# Patient Record
Sex: Male | Born: 1955 | Race: Black or African American | Hispanic: No | Smoking: Former smoker
Health system: Southern US, Community
[De-identification: ages and names within clinical notes are randomized; demographics above are authoritative.]

## PROBLEM LIST (undated history)

## (undated) DIAGNOSIS — I2510 Atherosclerotic heart disease of native coronary artery without angina pectoris: Secondary | ICD-10-CM

## (undated) DIAGNOSIS — I10 Essential (primary) hypertension: Secondary | ICD-10-CM

## (undated) DIAGNOSIS — I4891 Unspecified atrial fibrillation: Secondary | ICD-10-CM

## (undated) DIAGNOSIS — K219 Gastro-esophageal reflux disease without esophagitis: Secondary | ICD-10-CM

## (undated) DIAGNOSIS — E119 Type 2 diabetes mellitus without complications: Secondary | ICD-10-CM

## (undated) DIAGNOSIS — I739 Peripheral vascular disease, unspecified: Secondary | ICD-10-CM

## (undated) DIAGNOSIS — I82409 Acute embolism and thrombosis of unspecified deep veins of unspecified lower extremity: Secondary | ICD-10-CM

## (undated) DIAGNOSIS — E785 Hyperlipidemia, unspecified: Secondary | ICD-10-CM

## (undated) HISTORY — DX: Peripheral vascular disease, unspecified: I73.9

## (undated) HISTORY — PX: OTHER SURGICAL HISTORY: SHX169

## (undated) HISTORY — DX: Acute embolism and thrombosis of unspecified deep veins of unspecified lower extremity: I82.409

## (undated) HISTORY — PX: NECK SURGERY: SHX720

## (undated) HISTORY — DX: Hyperlipidemia, unspecified: E78.5

## (undated) HISTORY — DX: Type 2 diabetes mellitus without complications: E11.9

## (undated) HISTORY — DX: Essential (primary) hypertension: I10

---

## 2013-01-10 ENCOUNTER — Emergency Department: Payer: Self-pay | Admitting: Emergency Medicine

## 2013-01-10 LAB — COMPREHENSIVE METABOLIC PANEL
Albumin: 4 g/dL (ref 3.4–5.0)
Alkaline Phosphatase: 91 U/L (ref 50–136)
Anion Gap: 3 — ABNORMAL LOW (ref 7–16)
Bilirubin,Total: 0.3 mg/dL (ref 0.2–1.0)
Calcium, Total: 9.5 mg/dL (ref 8.5–10.1)
Creatinine: 1.05 mg/dL (ref 0.60–1.30)
EGFR (African American): >60
EGFR (Non-African Amer.): >60
Glucose: 268 mg/dL — ABNORMAL HIGH (ref 65–99)
Osmolality: 287 (ref 275–301)
Potassium: 4.3 mmol/L (ref 3.5–5.1)
SGOT(AST): 16 U/L (ref 15–37)
Sodium: 139 mmol/L (ref 136–145)

## 2013-01-10 LAB — CBC
HCT: 50.7 % (ref 40.0–52.0)
HGB: 17.6 g/dL (ref 13.0–18.0)
MCH: 30.2 pg (ref 26.0–34.0)
MCV: 87 fL (ref 80–100)
Platelet: 221 10*3/uL (ref 150–440)
RDW: 13.6 % (ref 11.5–14.5)

## 2013-01-11 LAB — URINALYSIS, COMPLETE
Bacteria: NONE SEEN
Blood: NEGATIVE
Ketone: NEGATIVE
Leukocyte Esterase: NEGATIVE
Nitrite: NEGATIVE
Ph: 5 (ref 4.5–8.0)
Specific Gravity: 1.025 (ref 1.003–1.030)
Squamous Epithelial: <1
WBC UR: 1 /HPF (ref 0–5)

## 2013-11-12 LAB — BASIC METABOLIC PANEL
Anion Gap: 6 — ABNORMAL LOW (ref 7–16)
BUN: 8 mg/dL (ref 7–18)
CALCIUM: 9.8 mg/dL (ref 8.5–10.1)
Chloride: 101 mmol/L (ref 98–107)
Co2: 30 mmol/L (ref 21–32)
Creatinine: 0.87 mg/dL (ref 0.60–1.30)
EGFR (African American): >60
EGFR (Non-African Amer.): >60
Glucose: 305 mg/dL — ABNORMAL HIGH (ref 65–99)
Osmolality: 284 (ref 275–301)
Potassium: 3.9 mmol/L (ref 3.5–5.1)
Sodium: 137 mmol/L (ref 136–145)

## 2013-11-12 LAB — URINALYSIS, COMPLETE
BACTERIA: NEGATIVE
Bilirubin,UR: NEGATIVE
Blood: NEGATIVE
Glucose,UR: >=500 mg/dL (ref 0–75)
Ketone: NEGATIVE
LEUKOCYTE ESTERASE: NEGATIVE
NITRITE: NEGATIVE
PH: 5 (ref 4.5–8.0)
PROTEIN: NEGATIVE
RBC, UR: NONE SEEN /HPF (ref 0–5)
Specific Gravity: 1.024 (ref 1.003–1.030)
Squamous Epithelial: NONE SEEN
WBC UR: NONE SEEN /HPF (ref 0–5)

## 2013-11-12 LAB — CBC
HCT: 44.1 % (ref 40.0–52.0)
HGB: 14.8 g/dL (ref 13.0–18.0)
MCH: 29.7 pg (ref 26.0–34.0)
MCHC: 33.6 g/dL (ref 32.0–36.0)
MCV: 88 fL (ref 80–100)
Platelet: 209 10*3/uL (ref 150–440)
RBC: 4.99 10*6/uL (ref 4.40–5.90)
RDW: 14 % (ref 11.5–14.5)
WBC: 11.3 10*3/uL — ABNORMAL HIGH (ref 3.8–10.6)

## 2013-11-13 ENCOUNTER — Inpatient Hospital Stay: Payer: Self-pay | Admitting: Internal Medicine

## 2013-11-13 LAB — VANCOMYCIN, TROUGH: Vancomycin, Trough: 15 ug/mL (ref 10–20)

## 2013-11-14 LAB — CBC WITH DIFFERENTIAL/PLATELET
BASOS ABS: 0.1 10*3/uL (ref 0.0–0.1)
Basophil %: 0.7 %
EOS ABS: 0.1 10*3/uL (ref 0.0–0.7)
Eosinophil %: 1 %
HCT: 38.7 % — ABNORMAL LOW (ref 40.0–52.0)
HGB: 13.2 g/dL (ref 13.0–18.0)
LYMPHS PCT: 27.5 %
Lymphocyte #: 2.5 10*3/uL (ref 1.0–3.6)
MCH: 29.8 pg (ref 26.0–34.0)
MCHC: 34.1 g/dL (ref 32.0–36.0)
MCV: 87 fL (ref 80–100)
Monocyte #: 0.8 x10 3/mm (ref 0.2–1.0)
Monocyte %: 8.8 %
NEUTROS ABS: 5.6 10*3/uL (ref 1.4–6.5)
NEUTROS PCT: 62 %
Platelet: 184 10*3/uL (ref 150–440)
RBC: 4.44 10*6/uL (ref 4.40–5.90)
RDW: 13.5 % (ref 11.5–14.5)
WBC: 9 10*3/uL (ref 3.8–10.6)

## 2013-11-14 LAB — HEMOGLOBIN A1C: Hemoglobin A1C: 8.1 % — ABNORMAL HIGH (ref 4.2–6.3)

## 2013-11-14 LAB — LIPID PANEL
CHOLESTEROL: 166 mg/dL (ref 0–200)
HDL Cholesterol: 23 mg/dL — ABNORMAL LOW (ref 40–60)
LDL CHOLESTEROL, CALC: 106 mg/dL — AB (ref 0–100)
Triglycerides: 187 mg/dL (ref 0–200)
VLDL Cholesterol, Calc: 37 mg/dL (ref 5–40)

## 2013-11-14 LAB — BASIC METABOLIC PANEL
ANION GAP: 2 — AB (ref 7–16)
BUN: 6 mg/dL — ABNORMAL LOW (ref 7–18)
CALCIUM: 9.4 mg/dL (ref 8.5–10.1)
CO2: 29 mmol/L (ref 21–32)
Chloride: 105 mmol/L (ref 98–107)
Creatinine: 0.79 mg/dL (ref 0.60–1.30)
EGFR (African American): >60
EGFR (Non-African Amer.): >60
Glucose: 156 mg/dL — ABNORMAL HIGH (ref 65–99)
Osmolality: 273 (ref 275–301)
Potassium: 3.9 mmol/L (ref 3.5–5.1)
Sodium: 136 mmol/L (ref 136–145)

## 2013-11-16 LAB — VANCOMYCIN, TROUGH: Vancomycin, Trough: 19 ug/mL (ref 10–20)

## 2013-11-16 LAB — SEDIMENTATION RATE: Erythrocyte Sed Rate: 36 mm/hr — ABNORMAL HIGH (ref 0–20)

## 2013-11-17 LAB — CBC WITH DIFFERENTIAL/PLATELET
BASOS ABS: 0 10*3/uL (ref 0.0–0.1)
Basophil %: 0.3 %
EOS PCT: 1.6 %
Eosinophil #: 0.1 10*3/uL (ref 0.0–0.7)
HCT: 40.1 % (ref 40.0–52.0)
HGB: 14.1 g/dL (ref 13.0–18.0)
LYMPHS ABS: 2.4 10*3/uL (ref 1.0–3.6)
Lymphocyte %: 26.3 %
MCH: 30.3 pg (ref 26.0–34.0)
MCHC: 35.1 g/dL (ref 32.0–36.0)
MCV: 86 fL (ref 80–100)
MONO ABS: 0.9 x10 3/mm (ref 0.2–1.0)
Monocyte %: 9.5 %
NEUTROS ABS: 5.7 10*3/uL (ref 1.4–6.5)
Neutrophil %: 62.3 %
PLATELETS: 218 10*3/uL (ref 150–440)
RBC: 4.66 10*6/uL (ref 4.40–5.90)
RDW: 13.6 % (ref 11.5–14.5)
WBC: 9.1 10*3/uL (ref 3.8–10.6)

## 2013-11-17 LAB — BASIC METABOLIC PANEL
ANION GAP: 5 — AB (ref 7–16)
BUN: 10 mg/dL (ref 7–18)
CO2: 31 mmol/L (ref 21–32)
CREATININE: 0.85 mg/dL (ref 0.60–1.30)
Calcium, Total: 9.6 mg/dL (ref 8.5–10.1)
Chloride: 99 mmol/L (ref 98–107)
EGFR (African American): >60
EGFR (Non-African Amer.): >60
Glucose: 172 mg/dL — ABNORMAL HIGH (ref 65–99)
OSMOLALITY: 273 (ref 275–301)
Potassium: 4.3 mmol/L (ref 3.5–5.1)
Sodium: 135 mmol/L — ABNORMAL LOW (ref 136–145)

## 2013-11-18 LAB — CULTURE, BLOOD (SINGLE)

## 2013-11-20 LAB — WOUND CULTURE

## 2013-11-22 LAB — WOUND CULTURE

## 2013-11-24 LAB — PATHOLOGY REPORT

## 2014-03-08 ENCOUNTER — Ambulatory Visit: Payer: Self-pay | Admitting: Gastroenterology

## 2014-03-11 LAB — PATHOLOGY REPORT

## 2014-05-04 ENCOUNTER — Other Ambulatory Visit: Payer: Self-pay | Admitting: *Deleted

## 2014-05-04 ENCOUNTER — Encounter: Payer: Self-pay | Admitting: Podiatry

## 2014-05-04 ENCOUNTER — Ambulatory Visit (INDEPENDENT_AMBULATORY_CARE_PROVIDER_SITE_OTHER): Payer: 59

## 2014-05-04 ENCOUNTER — Ambulatory Visit (INDEPENDENT_AMBULATORY_CARE_PROVIDER_SITE_OTHER): Payer: 59 | Admitting: Podiatry

## 2014-05-04 VITALS — BP 124/74 | HR 69 | Resp 16 | Ht 76.0 in | Wt 209.0 lb

## 2014-05-04 DIAGNOSIS — E119 Type 2 diabetes mellitus without complications: Secondary | ICD-10-CM

## 2014-05-04 DIAGNOSIS — E1149 Type 2 diabetes mellitus with other diabetic neurological complication: Secondary | ICD-10-CM

## 2014-05-04 DIAGNOSIS — Q828 Other specified congenital malformations of skin: Secondary | ICD-10-CM

## 2014-05-04 NOTE — Progress Notes (Signed)
   Subjective:    Patient ID: Austin DibblesHosea D Valencia, male    DOB: 11/07/1955, 58 y.o.   MRN: [id number]  HPI Comments: i have a place on the bottom of my right foot. Its been there 1 - 2 years. Its getting worse. i cant walk bare footed. Its painful. i went to [facility] and they took x-rays and that's it. i dont know when i went. A nurse from [facility] took care of it. i dont do anything for it.  Foot Pain      Review of Systems  All other systems reviewed and are negative.      Objective:   Physical Exam: I have reviewed his past medical history medications allergies surgeries social history and review of systems. Pulses are barely palpable bilateral. Capillary fill time is somewhat sluggish particularly with elevation. Neurologic sensorium is decreased per since once the monofilament bilateral forefoot. He tendon reflexes are in non-elicitable. Muscle strength is 4/5 dorsiflexors plantar flexors inverters and evertors and all intrinsic musculature appears to be intact orthopedic evaluation demonstrates amputation of hallux left secondary to peripheral vascular disease and osteomyelitis. Hammertoe deformities 2 through 5 bilateral. Cutaneous evaluation demonstrates thick yellow dystrophic lytic clinically mycotic nails. Thick dystrophic porokeratotic lesion plantar aspect of the right foot. Radiographic evaluation demonstrates normal osseous architecture with exception of the amputation hallux left.        Assessment & Plan:  Assessment: Diabetic peripheral neuropathy. With diabetic peripheral angiopathy. Pain in limb secondary to diabetic peripheral neuropathy and painful porokeratotic lesion.  Plan: Followup with him in 2 months for routine nail debridement. Debrided the reactive hyperkeratosis for him today. No open wounds. He follows up with his vascular surgeon on a regular basis.

## 2014-08-03 ENCOUNTER — Ambulatory Visit: Payer: 59 | Admitting: Podiatry

## 2014-12-29 IMAGING — XA IR PTA ARTERIAL
13 of 15 series · 15 of 24 positions shown · IV contrast (IODINE)
Comparison: none

[Series 1: care aorta · 1 of 2 slices shown (1 of 2)]
[im 1/2]
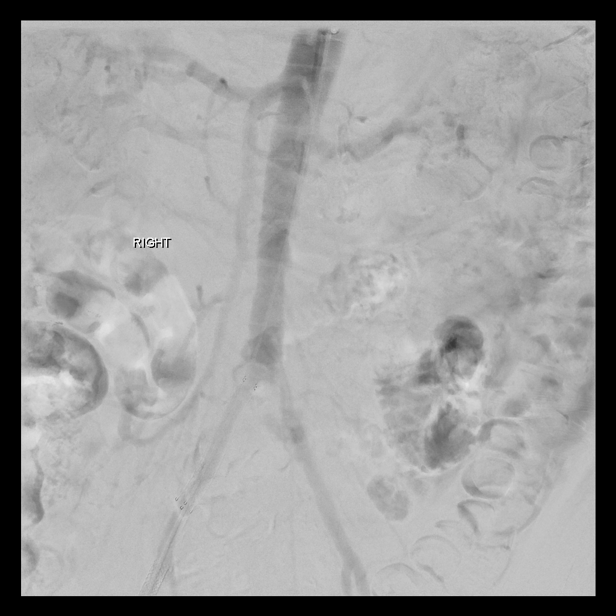

[Series 3: care aorta · 1 of 2 slices shown (2 of 2)]
[im 1/2]
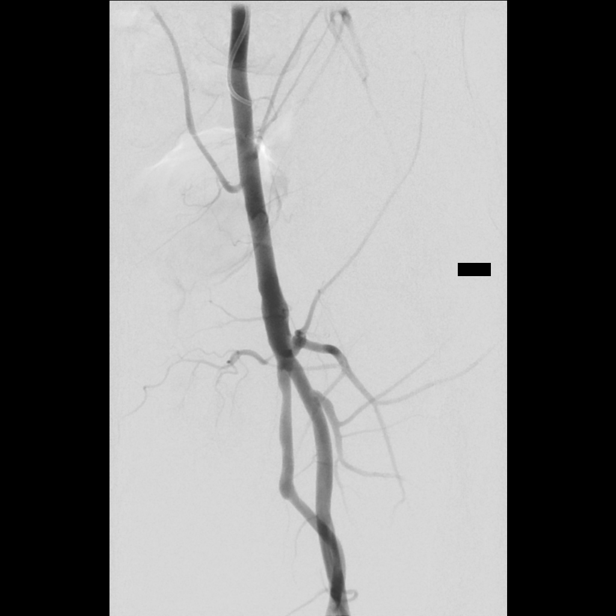

[Series 4: care sfa · 1 of 2 slices shown (1 of 9)]
[im 2/2]
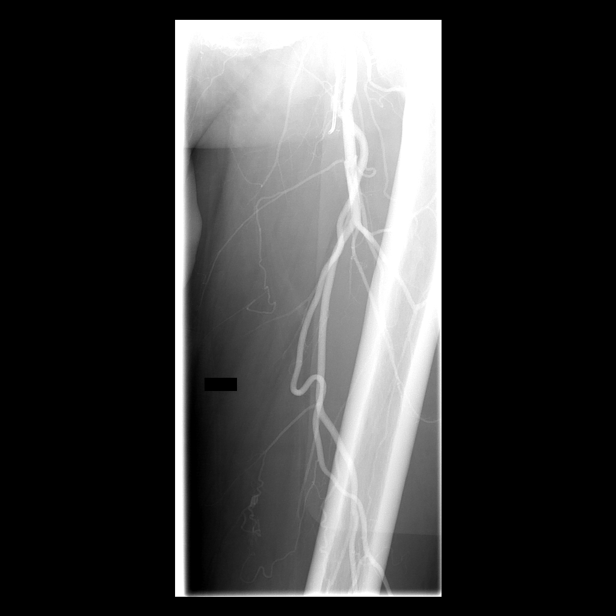

[Series 5: care sfa · 1 of 1 slices shown (2 of 9)]
[im 1/1]
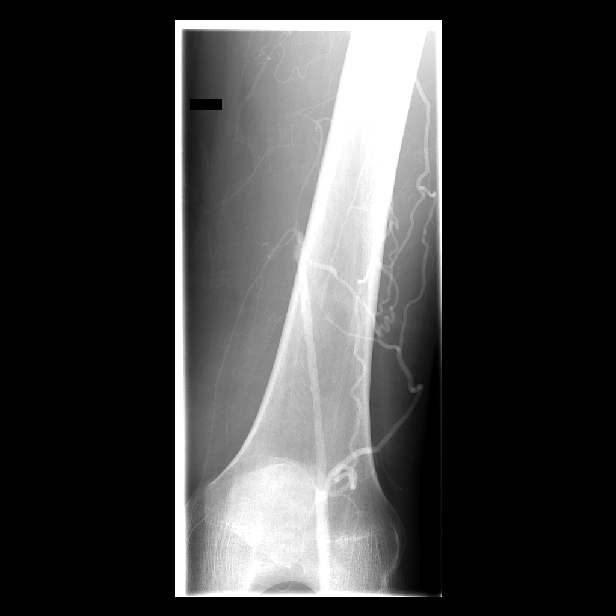

[Series 6: care sfa · 1 of 2 slices shown (3 of 9)]
[im 2/2]
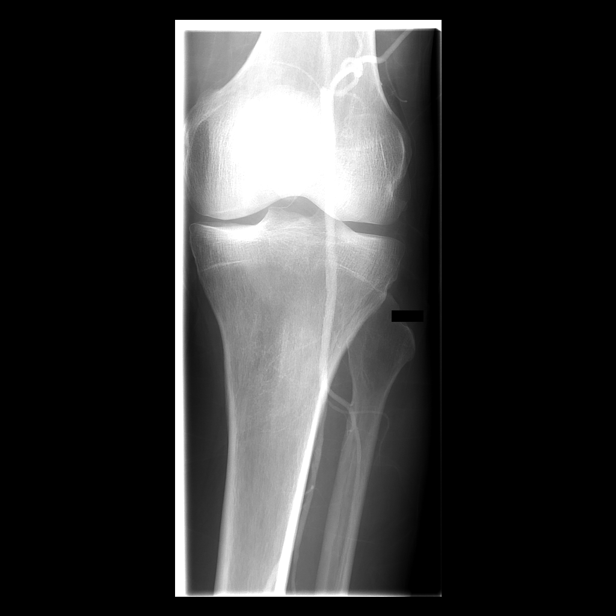

[Series 7: care sfa · 1 of 2 slices shown (4 of 9)]
[im 1/2]
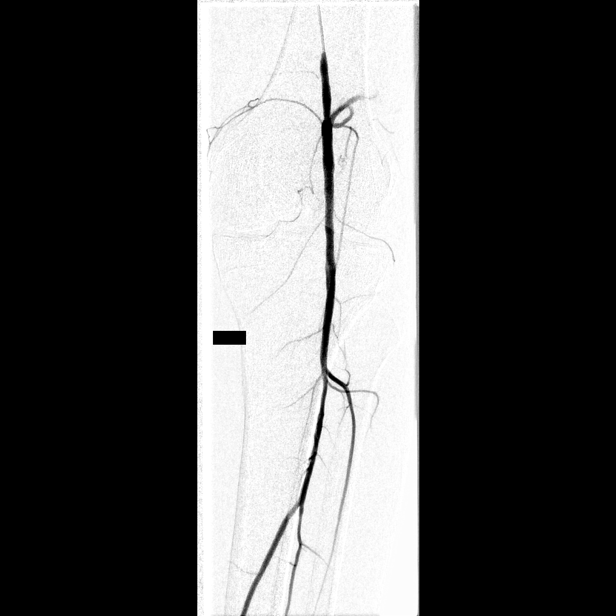

[Series 8: fl - angio · 1 of 1 slices shown (1 of 2)]
[im 1/1]
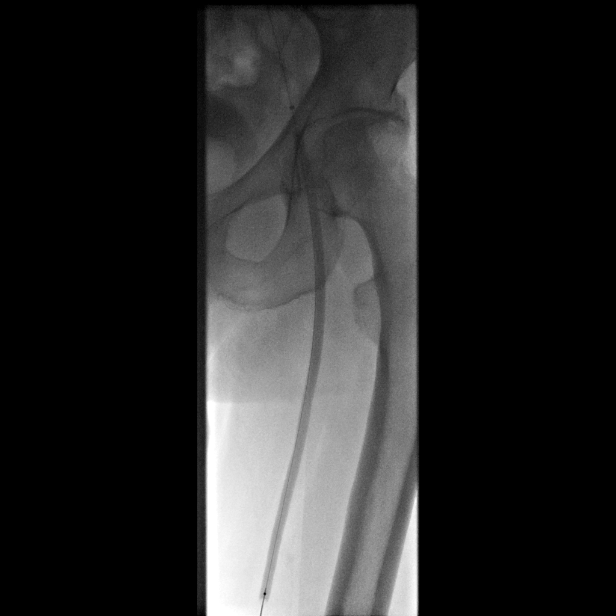

[Series 10: fl - angio · 1 of 1 slices shown (2 of 2)]
[im 1/1]
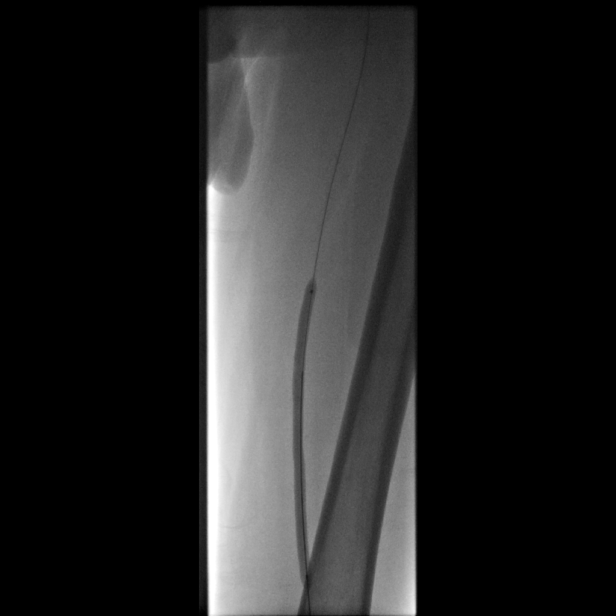

[Series 11: care sfa · 1 of 2 slices shown (5 of 9)]
[im 1/2]
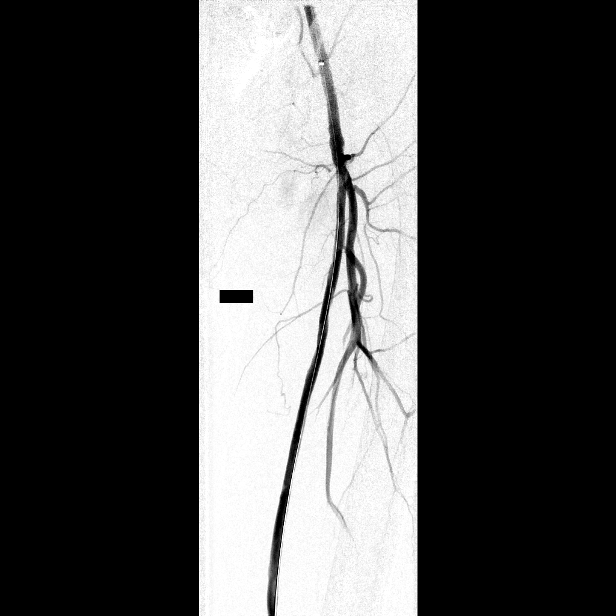

[Series 12: care sfa · 2 of 2 slices shown (6 of 9)]
[im 1/2]
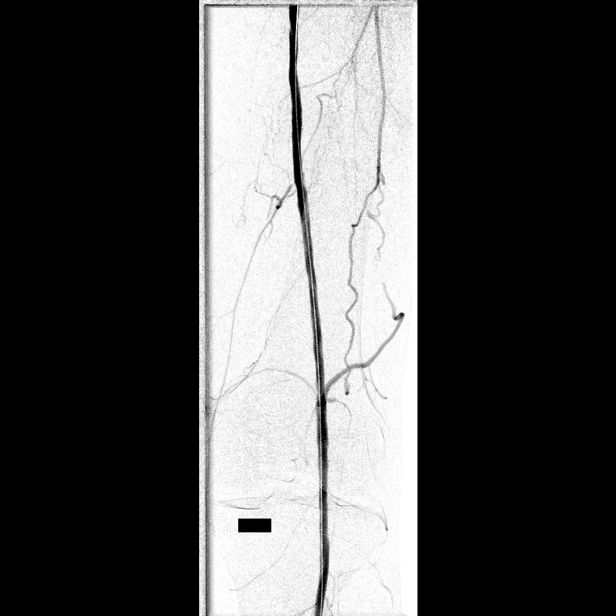
[im 2/2]
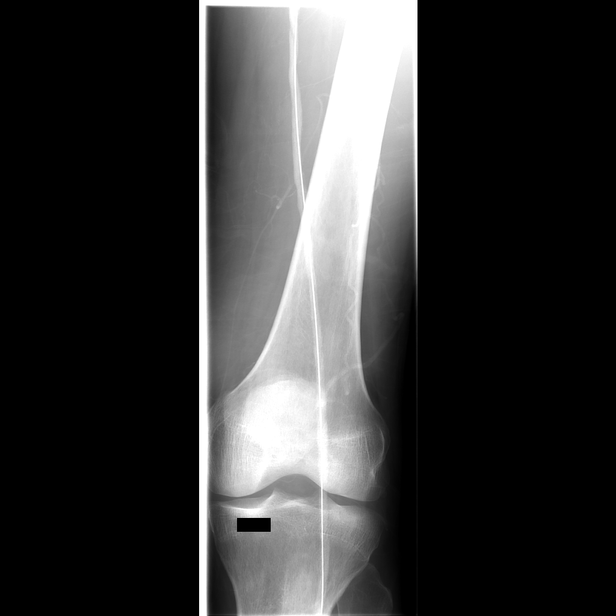

[Series 13: care sfa · 1 of 3 slices shown (7 of 9)]
[im 2/3]
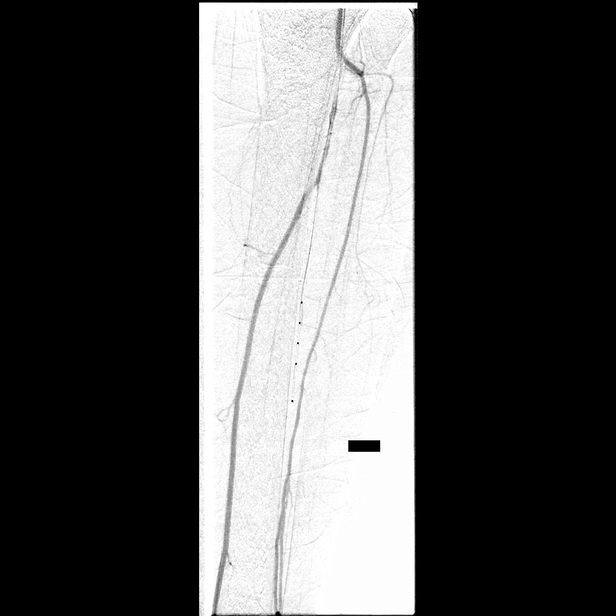

[Series 14: care sfa · 2 of 2 slices shown (8 of 9)]
[im 1/2]
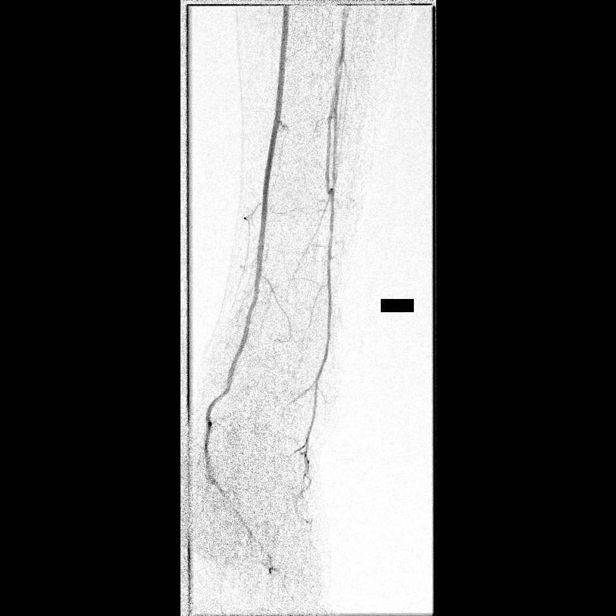
[im 2/2]
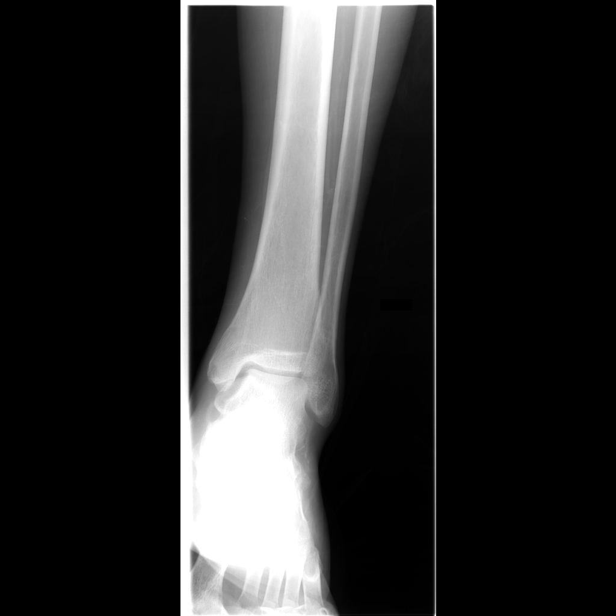

[Series 15: care sfa · 1 of 2 slices shown (9 of 9)]
[im 2/2]
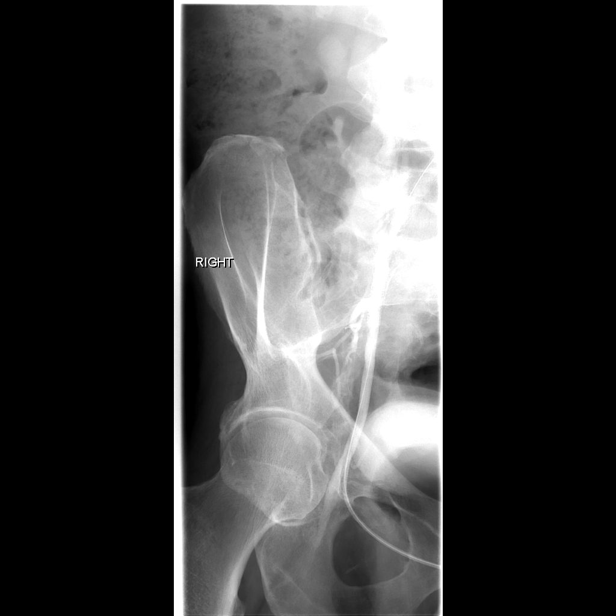

[15 of 24 positions shown; findings below may reference images not displayed]

Canned report from images found in remote index.

Refer to host system for actual result text.

## 2015-01-28 NOTE — H&P (Signed)
PATIENT NAME:  Austin [name], Austin [name] MR#:  [id number] DATE OF BIRTH:  1956-03-22  DATE OF ADMISSION:  11/13/2013  PRIMARY CARE PHYSICIAN:  Dr. Charm BargesButler.   REFERRING EMERGENCY ROOM PHYSCIAN:  Dr. Dolores FrameSung.   CHIEF COMPLAINT:  Left foot swelling, pain and redness.   HISTORY OF PRESENT ILLNESS:  The patient is a 59 year old African American male with past medical history of insulin-dependent diabetes mellitus, chronic low back pain, has [facility] for chronic diabetic foot ulcers for the past two months.  [facility], but for the past one day he has been developing fever and also noticed swelling, pain and redness in the left foot.  As the pain and swelling are getting worse, the patient came into the ER.  Left foot x-ray in the ER has revealed osteomyelitis.  Blood cultures were obtained.  The patient has received first dose of IV Zosyn and vancomycin.  During my examination, the patient is still complaining of pain.  He is feeling hungry and asking for food.  No other complaints.  No family members at bedside.  Denies any chest pain, shortness of breath or abdominal pain.  No other complaints.   PAST MEDICAL HISTORY:  Insulin-dependent diabetes mellitus, chronic upper back pain and neck pain.   PAST SURGICAL HISTORY:  [facility].   PSYCHOSOCIAL HISTORY:  Lives with cousin, smokes 1 pack a day.  Denies alcohol or illicit drug usage.   FAMILY HISTORY:  Diabetes runs in his family.   HOME MEDICATIONS:  Lantus 10 units subQ at bedtime, metoprolol dose unknown once a day, metformin 1000 mg 2 times a day.   REVIEW OF SYSTEMS:  CONSTITUTIONAL:  Denies any fever or fatigue.  EYES:  Denies blurry vision, double vision.  EARS, NOSE, THROAT:  Denies epistaxis, discharge.  RESPIRATION:  Denies cough, COPD.  CARDIOVASCULAR:  Denies any chest pain or palpitations.   GASTROINTESTINAL:  Denies nausea, vomiting, diarrhea.  No melena.  GENITOURINARY:  No  dysuria or hematuria.  ENDOCRINE:  Denies polyuria, nocturia.  Has insulin-dependent diabetes mellitus.  HEMATOLOGIC AND LYMPHATIC:  No anemia, easy bruising, bleeding.  INTEGUMENTARY:  No acne, rash, chronic diabetic foot ulcers at the base of the right great toe and also left great toe. MUSCULOSKELETAL:  Chronic back pain.  Denies gout.   NEUROLOGIC:  No vertigo, ataxia. PSYCHIATRIC:  No ADD, OCD.   PHYSICAL EXAMINATION: VITAL SIGNS:  Temperature 100.5, pulse 97, respirations 22, blood pressure 144/67, pulse ox 98%.  GENERAL APPEARANCE:  Not under acute distress.  Moderately built and nourished.  HEENT:  Normocephalic, atraumatic.  Pupils are equally reactive to light and accommodation.  No scleral icterus.  No conjunctival injection.  No sinus tenderness.  No postnasal drip.  NECK:  Supple.  No JVD.  No thyromegaly.  Range of motion is intact.  LUNGS:  Clear to auscultation bilaterally.  No accessory muscle usage.  No anterior chest wall tenderness on palpation.  CARDIAC:  S1, S2 normal.  Regular rate and rhythm.  No murmurs.  GASTROINTESTINAL:  Soft.  Bowel sounds are positive in all four quadrants.  Nontender, nondistended.  No hepatosplenomegaly.  No masses felt.  NEUROLOGIC:  Awake, alert and oriented x 3.  Motor and sensory are grossly intact.  Reflexes are 2+.  EXTREMITIES:  No edema.  Left foot is erythematous, tender, edematous.  Left great toe with a diabetic foot ulcer which is chronic in nature and no purulent  discharge is noticed.  Right lower extremity at the base of the right great toe on the plantar aspect is a tender callus is found.  PSYCHIATRIC:  Normal mood and affect.  MUSCULOSKELETAL:  No joint effusion, tenderness, erythema.   LABORATORY AND IMAGING STUDIES:  Left foot AP and lateral x-ray:  Positive for osteomyelitis of the tuft of the left distal phalanx.  Right foot AP and lateral views:  No plain radiographic evidence of right foot osteomyelitis.  No acute fracture  or dislocation identified.  WBC 11.3, hemoglobin 14.8, hematocrit 44.1, platelets 209.  Urinalysis, glucose greater than 500, leukocyte esterase and nitrites are negative.  Chem-8 is normal except glucose which is at 305 and anion gap at 6.   ASSESSMENT AND PLAN:  A 59 year old African American male presenting to the ER with a chief complaint of two month history of chronic diabetic foot ulcers and one day history of fever associated with left foot pain, swelling and redness, will be admitted with the following assessment and plan.  1.  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  We will admit the patient to med/surg floor.  Blood cultures were obtained.  The patient will be receiving IV Zosyn and vancomycin.  Pharmacy consult is placed for vancomycin dosing.  Pain management with morphine.  Surgical consult is placed to on-call surgeon, Dr. Anda Kraft.  2.  Insulin-dependent diabetes mellitus.  This needs to be tightly controlled.  We will continue patient's home medication Lantus at 10 units subQ at bedtime and metformin.  The patient will be on sliding scale insulin.  3.  Chronic history of lumbago.  Pain management as needed.  4.  Nicotine dependence.  The patient was counseled to quit smoking.  5.  We will provide him gastrointestinal and deep vein thrombosis prophylaxis.   Diagnosis and plan of care was discussed in detail with the patient.  He is aware of the plan.  Total time spent on the admission is 45 minutes.    ____________________________ Ramonita Lab, MD ag:ea [name]: 11/13/2013 01:43:33 ET T: 11/13/2013 03:12:52 ET JOB#: [id number]  cc: Ramonita Lab, MD, <Dictator> Ramonita Lab MD ELECTRONICALLY SIGNED 11/26/2013 1:07

## 2015-01-28 NOTE — Op Note (Signed)
PATIENT NAME:  Austin [name], Austin [name] MR#:  [id number] DATE OF BIRTH:  30-May-1956  DATE OF PROCEDEddie DibblesURE:  11/18/2013  PREOPERATIVE DIAGNOSIS: Gangrene osteomyelitis, left hallux.   POSTOPERATIVE DIAGNOSIS: Gangrene osteomyelitis, left hallux.   PROCEDURE: Amputation of left hallux of the first metatarsophalangeal joint.  SURGEON: Recardo EvangelistMatthew Franca Stakes, DPM  ANESTHESIA: General with local with assistance of anesthesiologist Dr. Noralyn Pickarroll.  ESTIMATED BLOOD LOSS: Less than 10 mL.   HISTORY OF PRESENT ILLNESS: The patient has had pain, discomfort, and drainage from an ulcer at the tip of the left hallux, color changes to the great toe consisting of gangrenous necrotic tissue developing at the tip of the toe with resultant pain and subsequent osteomyelitis. He had an angioplasty done on Tuesday and he got better perfusion to the left lower extremity, and I was able to observe the tissue damage in the left foot and decided that he is probably going to need a level to the MTP joint.   ANKLE TOURNIQUET: 250 mmHg.  OPERATIVE REPORT: The patient was brought to the OR and placed on the OR table in supine position. At this point, after general anesthesia and local anesthesia was achieved, the patient was then prepped and draped in the usual sterile manner. At this time, I debrided the wound at the tip of the left hallux which enabled me to observe that there was a large area of gangrenous tissue at the tip of the toe which was not amenable to partial hallux resection. This extended along the lateral aspect of the toe to the IPJ level rendering it unlikely to expect healing at an amputation site along that region. At this point, I elected to do a MTPJ amputation. Two elliptical incisions were made from medial to lateral with a longer arm medially. This was carried down to bone. The MTP joint was identified and soft tissue was released from the area. The toe was removed in toto. The area was then copiously irrigated. Good  apposition of skin was achieved. At this point, the area was copiously irrigated once again and deep and superficial fascial layers were then closed with 3-0 Vicryl in simple interrupted sutures. Skin was closed in combination of 3-0 nylon in simple interrupted sutures and horizontal mattress combinations. The patient was then dressed with sterile compressive dressing consisting of Xeroform gauze, 4 x 4's, Kling, Kerlix, and ABD pads. The patient appeared to tolerate the procedure and anesthesia well. Tourniquet had been released after the amputation. Bleeders were clamped and bovied as required. Bleeding was reasonable at that area. Hopefully it will heal, but still a little bit suppressed from an ordinary situation. He tolerated everything nicely, left the OR for the recovery room with vital signs stable and neurovascular status intact. I will see him again tomorrow.   ____________________________ Rhona RaiderMatthew G. Minor Iden, DPM mgt:sb [name]: 11/18/2013 14:43:03 ET T: 11/18/2013 15:30:57 ET JOB#: [id number]  cc: Rhona RaiderMatthew G. Lissandro Dilorenzo, DPM, <Dictator> Epimenio SarinMATTHEW G Ender Rorke MD ELECTRONICALLY SIGNED 11/25/2013 13:34

## 2015-01-28 NOTE — Discharge Summary (Signed)
PATIENT NAME:  Austin DibblesBRADSHER, Odas D MR#:  [id number] DATE OF BIRTH:  1956/06/04  DATE OF ADMISSION:  11/13/2013 DATE OF DISCHARGE:  11/19/2013  DISCHARGE DIAGNOSES: 1. Left great toe osteomyelitis, status post amputation of the left hallux at metatarsophalangeal joint and status post revascularization.   SECONDARY DIAGNOSES:  1. Diabetes.  2. Chronic back pain and neck pain.   CONSULTATIONS:  1. Podiatry, Dr. Orland Jarredroxler.  2. Infectious disease, Dr. Sampson GoonFitzgerald.  3. Vascular surgery, Dr. Gilda CreaseSchnier.   PROCEDURES AND RADIOLOGY:  1. Abdominal aortogram with PTCA of the left superficial femoral artery and above-knee popliteal by Dr. Gilda CreaseSchnier on 10th of February.  2. Amputation of the left hallux at MTP joint by Dr. Orland Jarredroxler on 12th of February.  3. Right foot x-ray on 7th of February showed no evidence of osteomyelitis. No acute fracture or dislocation.  4. Left foot x-ray on 7th of February showed positive for osteomyelitis at the tuft of the left distal phalanx.   MAJOR LABORATORY PANEL:  1. UA on admission was negative.  2. Blood culture x1 was negative on 6th of February. Repeat culture on 7th of February was negative.  3. Wound was growing coagulase-negative staph. Light growth of anaerobic gram-positive cocci and Finegoldia magna.   HISTORY AND SHORT HOSPITAL COURSE: The patient is a 59 year old male with above-mentioned medical problems, who was admitted for left foot osteomyelitis with a history of diabetic foot ulcer. Please see Dr. Rob HickmanGouru's dictated history and physical for further details. Infectious disease consultation was obtained with Dr. Sampson GoonFitzgerald, who recommended continuing broad-spectrum antibiotic in the form of vancomycin and Zosyn and consulting podiatry along with vascular surgery. Dr. Orland Jarredroxler from podiatry evaluated the patient and recommended revascularization of the left food for re-establishment of the flow, which was performed by Dr. Gilda CreaseSchnier by doing abdominal aortogram and PTCA  of the left superficial femoral artery and above-knee popliteal artery. Subsequently, the patient had an amputation of the left hallux at the MTP joint by Dr. Orland Jarredroxler on 12th of February, and the patient was feeling much better. The patient did not have any further fever. White count was coming down. Due to minimal destruction and amputation, podiatry recommended short course of oral antibiotic. Wound was also not growing any other significant bacteria, and the patient was discharged on 13th of February in stable condition.   PERTINENT PHYSICAL EXAMINATION ON THE DATE OF DISCHARGE:  VITAL SIGNS: On the date of discharge, his vital signs were as follows: Temperature 97.8, heart rate 73 per minute, respirations 20 per minute, blood pressure 115/74 mmHg, he was saturating 97% on room air.  CARDIOVASCULAR: S1, S2 normal. No murmurs, rubs or gallop.  LUNGS: Clear to auscultation bilaterally. No wheezing, rales, rhonchi or crepitation.  ABDOMEN: Soft, benign.  NEUROLOGIC: Nonfocal examination.  EXTREMITIES: [name] foot looked clean. The bandage was dry, clean and intact at the site of amputation. Dry dressing was applied.  All other physical examination remained at baseline.   DISCHARGE MEDICATIONS:  1. Metformin 1000 mg p.o. b.i.d.  2. Insulin Lantus 10 units subcutaneous daily.  3. Lisinopril 10 mg p.o. daily.  4. Aspirin 81 mg p.o. daily.  5. Plavix 75 mg p.o. daily.  6. Acetaminophen/oxycodone 325/5 mg 1 tablet p.o. b.i.d. as needed.  7. Augmentin 875/125 mg p.o. b.i.d. for 7 days.   DISCHARGE DIET: Low sodium.   DISCHARGE ACTIVITY: As tolerated.   DISCHARGE INSTRUCTIONS AND FOLLOWUP: The patient was instructed to follow up with Dr. Orland Jarredroxler in 1 to 2 weeks.  He will need followup with Dr. Sampson Goon in 2 to 4 weeks. He will need followup with his primary care physician, Dr. Charm Barges, in 2 to 3 weeks. He was instructed not to do dressing changes until followup with podiatry, Dr. Orland Jarred, and  recommended to keep his dressing dry. He was set up to get home health physical therapy, nursing and nursing aide.   TOTAL TIME DISCHARGING THIS PATIENT: 55 minutes.    ____________________________ Ellamae Sia. Sherryll Burger, MD vss:lb D: 11/20/2013 11:34:48 ET T: 11/20/2013 12:06:24 ET JOB#: [id number]  cc: Katisha Shimizu S. Sherryll Burger, MD, <Dictator> Dr. Katha Cabal. Troxler, DPM Stann Mainland. Sampson Goon, MD Renford Dills, MD Ellamae Sia [facility] MD ELECTRONICALLY SIGNED 11/20/2013 13:18

## 2015-01-28 NOTE — Consult Note (Signed)
Brief Consult Note: Diagnosis: ischemia with possible osteo left hallux.   Patient was seen by consultant.   Consult note dictated.   Recommend further assessment or treatment.   Orders entered.   Comments: needs vascular consult asap to reestablish flow to left foot.  May have osteo, but wont heal until better flow is acheived.  Electronic Signatures: Epimenio Sarinroxler, Nylah Butkus G (MD)  (Signed 09-Feb-15 17:48)  Authored: Brief Consult Note   Last Updated: 09-Feb-15 17:48 by Epimenio Sarinroxler, Altus Zaino G (MD)

## 2015-01-28 NOTE — Consult Note (Signed)
PATIENT NAME:  Austin [name], Austin [name] MR#:  [id number] DATE OF BIRTH:  September 04, 1956  DATE OF CONSULTATION:  11/15/2013  CONSULTING PHYSICIAN:  Rhona RaiderMatthew G. Tatijana Bierly, DPM  REPORT OF CONSULTATION: This is a 59 year old male admitted to the hospital yesterday because of pain in his left foot and left great toe. Some pain in his right foot as well, but the left great toe is by far the most involved at this point. He has a history of diabetes, and has had some drainage from the left great toe. He says he has never been seen for his feet before. His note from the ER said he was getting some [facility] wound care, but that is not what he expressed to me at this point. He may have misunderstood me, so I will clarify that. On further clarification with the patient, he says that he was seen [facility] or [facility]. They took some x-rays. This was about 2 months ago, and he has not seen them since that timeframe. He states he has a lot of pain in the left foot in particular. The right foot hurts him too, but the left is worse. He has not had any open areas that he is aware of. What was once open he states healed up some, but it is very painful for him at this point in his left great toe.   MEDICAL HISTORY: Includes insulin-dependent diabetes, chronic back pain, neck pain. [facility].   SOCIAL HISTORY: Smokes 1 pack a day. He denies EtOH or illicit drug usage. He lives with a cousin.   FAMILY HISTORY: Includes diabetes.   HOME MEDICATIONS: Include Lantus 10 units subcu at bedtime, metoprolol once a day, metformin 1000 mg b.i.[name].   PHYSICAL EXAMINATION: GENERAL:  He is an alert, well-oriented, pleasant, 59 year old African-American male. VITAL SIGNS:  His current blood pressure is 113/73, temp 97.9, pulse 69, respirations 18.  LOWER EXTREMITY EXAM: Vascular DP pulses are nonpalpable bilaterally. He has got a lot of discoloration to his left hallux. It has a dusky appearance. There appears to be some  cyanotic ischemic changes to the left great toe. His whole left foot has a little different color than his right foot. He has nonpalpable pulses, DP and PT pulses, as well as his popliteal pulses are nonpalpable. Left hallux is very tender to palpation. There is a fluctuance at the tip of the toe. There is a hyperkeratotic build up in that region. Debridement of this yielded some fluid drainage from the area, which I got a deep culture on at this timeframe, and we will put a wet-to-dry dressing on there. X-rays have shown me that he has a little bit of osteolysis, demineralization of the distal tuft of the left hallux, which could be consistent with osteomyelitis.   LAB RESULTS:  White count was 11.3 on February 6. He must have come into the ER at that point also, but on February 9, when he says he was admitted, it was down to 9.0, so I am not sure what the time table is on that as far as his admissions were concerned. His white count is now 9.0, hemoglobin of 13.2. My earlier statement that he was admitted on the 8th is incorrect, he was admitted on the 6th.   TREATMENT PLAN: I am awaiting a Vascular consult on him. He is going to need something to help improve the flow to this area in order to get things headed in the  right direction in order to be able to heal up for anything we need to do with the toe. I will also, as noted, debride the wound today, excise some of the hyperkeratotic tissue and some of the devitalized tissue from the tip of the toe and allow it to drain. At that time, will get cultures. This was debrided with a 15 blade at bedside. He is on pain medication already. If he does have osteomyelitis, he will likely need to lose the tip of that toe at some point. Will first things first, get this blood flow re-established and see if we can get things healed up some for him. His heavy smoking is not helpful for this.    ____________________________ Rhona Raider. Beacher Every, DPM mgt:mr [name]: 11/15/2013  17:46:19 ET T: 11/15/2013 19:35:47 ET JOB#: [id number]  cc: Rhona Raider. Khadeejah Castner, DPM, <Dictator> Epimenio Sarin MD ELECTRONICALLY SIGNED 11/25/2013 13:34

## 2015-01-28 NOTE — Op Note (Signed)
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  PROCEDURES PERFORMED: 1.  Abdominal aortogram.  2.  Left lower extremity distal runoff third order catheter placement.  3.  Percutaneous transluminal angioplasty of the left superficial femoral artery and above-knee popliteal.   PROCEDURE PERFORMED BY:  Renford DillsGregory G Schnier, MD   SEDATION:  Versed 5 mg plus fentanyl 200 mcg administered IV. Continuous ECG, pulse oximetry and cardiopulmonary monitoring is performed throughout the entire procedure by the interventional radiology nurse.  TOTAL SEDATION TIME:  One hour, 20 minutes.   ACCESS:  A 6-French sheath, right common femoral artery.   FLUOROSCOPY TIME:  8.0 minutes.   CONTRAST USED:  70 mL Isovue.   INDICATIONS FOR PROCEDURE:  Mr. Austin [name] is a 59 year old gentleman who presented to the hospital with cellulitis and gangrenous changes of his left forefoot. Although he denied any prior vascular problems or vascular therapies, when I began discussing the possibility of intervention and stenting he informed me he had already had 2 stents placed when [facility] a year or 2 ago. The risks and benefits for further angiography and intervention were reviewed. The patient has agreed to proceed with intervention for limb salvage.   DESCRIPTION OF PROCEDURE:  The patient is taken to special procedures and placed in the supine position. After adequate sedation is achieved, both groins are prepped and draped in a sterile fashion. Lidocaine 1% lidocaine is infiltrated in the soft tissues and access to the common femoral artery on the right is obtained with ultrasound guidance. The common femoral artery is  echolucent and pulsatile indicating patency. Image is recorded for the permanent record, and the micropuncture needle is used under direct visualization. Microwire followed by microsheath, J-wire followed by a 5-French sheath and 5-French pigtail catheter. Two previous stents, both on the right side, one within the external iliac and one within the common iliac are noted.   AP projection of the abdominal aorta is then obtained and the pigtail catheter is repositioned to above the bifurcation and an RAO projection of the pelvis is obtained. A stiff angled Glidewire is reintroduced and the catheter is advanced down to the distal external where an LAO projection of the groin is obtained. Cul-de-sac for the SFA is noted and the wire and pigtail catheter are negotiated into the cul-de-sac of the SFA where hand injection of contrast is utilized to demonstrate the more distal runoff. Above-knee reconstitution is noted and what appears to be patency at the level of the trifurcation. Therefore, 5000 units of heparin was given. A stiff angled Glidewire is reintroduced and the pigtail catheter removed. Ansell 6-French in size is advanced up and over the wire positioned with its tip in the mid common femoral, a straight slip cath and Glidewire are then used to negotiate across the SFA occlusion. Re-entry is noted at the level of the mid shaft of the femur, essentially at the level of the condyles, where hand injection of contrast is used to demonstrate intraluminal positioning, and the distal runoff is obtained demonstrating the anterior tibial does cross the ankle but is somewhat small and diminutive as it gets more distal. The posterior tibial is clearly the dominant runoff to the foot and is patent throughout its course. Peroneal  is heavily diseased.   Having crossed the occlusion, a Glidewire is exchanged for a Magic torque wire. The catheter is removed and initially a 6 x 25 Armada balloon is advanced over the wire.  This will not track across [name]'s canal and is repositioned to angioplasty the proximal portion of the SFA. Inflation is to 12 atmospheres for 2 minutes. Subsequently a 6 x 15 Rival balloon is advanced across [name]'s canal positioned so that the distal tip is at the level of the femoral condyles. Two separate inflations again to 12 atmospheres, each for 2 minutes are performed.   Followup angiography demonstrates wide patency of the entire SFA and popliteal. There is less than 10% residual stenosis at any location and rapid flow of contrast down to the trifurcation. Trifurcation runoff is preserved and the posterior tibial now has runoff down to the foot filling the pedal arch.   The sheath is then pulled into the external on the right, and a StarClose device deployed after an oblique image has been obtained.   INTERPRETATION: As noted above, there are 2 stents noted on the right side. They are widely patent. Left iliac system is widely patent as is the aorta. No hemodynamically significant lesions are noted.   The left common femoral and profunda femoris is widely patent. The superficial femoral artery occludes approximately 2 cm or so distal to its origin. Origin is moderately diseased. There is reconstitution above the knee at [name]'s canal, but this is fairly heavily diseased down to the level of the femoral condyle where there is now a normal-appearing popliteal. It extends distally to the trifurcation. Trifurcation is patent, but as noted above peroneal is diseased and the anterior tibial seems to be quite small as it courses more distally, although it is patent. Posterior tibial is dominant runoff and is patent, filling the foot in the pedal arch.   Following crossing the lesion angioplasty, there is now wide patency without need for further stenting.   ____________________________ Renford Dills, MD ggs:ce [name]: 11/16/2013 15:01:34 ET T: 11/16/2013 19:07:51  ET JOB#: [id number]  cc: Renford Dills, MD, <Dictator> Rhona Raider. Troxler, DPM Stann Mainland. Sampson Goon, MD Renford Dills MD ELECTRONICALLY SIGNED 11/18/2013 17:17

## 2015-01-28 NOTE — Consult Note (Signed)
PATIENT NAME:  Austin [name], Austin [name] MR#:  [id number] DATE OF BIRTH:  1956/03/15  DATE OF CONSULTATION:  11/15/2013  REFERRING PHYSICIAN:  Katha HammingSnehalatha Konidena, MD CONSULTING PHYSICIAN:  Stann Mainlandavid P. Sampson GoonFitzgerald, MD  REASON FOR CONSULTATION: Diabetic foot osteomyelitis.   HISTORY OF PRESENT ILLNESS: This is a 59 year old gentleman with diabetes and peripheral vascular disease status post [facility] in the last several months who presented February 7th with left foot swelling, pain, and redness. He was also found to have a temperature of 100.5. X-ray done in the ER showed osteomyelitis of the left distal phalange of the great toe. His white count was 11.3. The patient was admitted and started on vancomycin and Zosyn. We are consulted for further evaluation.   The patient is a very poor historian. He says [place] to live in his sister's trailer. He says he was having problems with his left foot, on the sole, and had seen a Dr. Jacqlyn LarsenBulla. He was apparently being [facility] as well. She has received some antibiotics, which he cannot name, by mouth. He has not had a PICC line or prolonged IV antibiotics. He says his toe has never been this bad before.   He does report that within the last several months he has had revascularization surgery of his lower extremities by [facility] vascular surgery. He has not followed up with them.   PAST MEDICAL HISTORY: 1.  Insulin-dependent diabetes.  2.  Chronic back and neck pain.  3.  Peripheral vascular disease.   PAST SURGICAL HISTORY: [facility] and [facility].   SOCIAL HISTORY: He is living with his sister here in a trailer. She rents it to him. He smokes a pack a day. Does not drink.   FAMILY HISTORY: Positive for diabetes.   MEDICATIONS: He is on apparently Lantus, metoprolol, and metformin. Since admission he has been started on vancomycin and Zosyn.   REVIEW OF SYSTEMS: Eleven systems reviewed  and negative, except as per HPI.   ALLERGIES: No known drug allergies.  PHYSICAL EXAMINATION: VITAL SIGNS: Temperature 97.8. No temperature since admission, although on admit he was 100.5, pulse 81, blood pressure 157/87, and pulse ox 98%.  GENERAL: He is disheveled, lying in bed, in no acute distress.  HEENT: Pupils equal, round, and reactive to light and accommodation. Extraocular movements are intact. Oropharynx is clear.  NECK: Supple.  HEART: Regular.  LUNGS: Clear to auscultation bilaterally.  ABDOMEN: Soft, nontender, nondistended. No hepatosplenomegaly.  EXTREMITIES: His left lower extremity has a blister over his great toe and foul drainage from it. It is quite swollen and tender. He also has some swelling of the dorsum of his foot. His right foot has calluses on the plantar surface, over the medial and lateral aspects. These do not appear infected. He does have cool extremities and somewhat decreased sensation and pulses.   LABORATORY AND DIAGNOSTICS: Labs are reviewed. Blood cultures x2 are pending. A1c is 8.1. Vancomycin level  was 15. White count on admission was 11.3, down to 9 yesterday. BUN was 6, creatinine 0.6.   X-ray done on admission of the left foot reveals positive for osteomyelitis of the tuft of the distal left phalanx. The right foot shows no evidence of osteomyelitis.   IMPRESSION: A 59 year old poorly controlled diabetic with A1c 8, also history of peripheral vascular disease and previously revascular[facility] recently [facility] by surgery here with necrotic left toe with underlying osteomyelitis. Culture of his  blood is pending. He is not septic. His wound has also been cultured.   RECOMMENDATIONS:  1.  Continue vancomycin and Zosyn. He would likely need a PICC line and at least several weeks of IV antibiotics depending on culture results and further management.  2.  Definitely recommend podiatry consult.  3.  I have placed an order to see if we can obtain records from  vascular surgery at [facility]. It may be worth reassessing his vascular function with arterial studies or consult from vascular.   Thank you for the consult. I will be glad to follow with you.   ____________________________ Stann Mainland. Sampson Goon, MD dpf:sb [name]: 11/15/2013 14:28:36 ET T: 11/15/2013 15:06:51 ET JOB#: [id number]  cc: Stann Mainland. Sampson Goon, MD, <Dictator> Rajendra Spiller Sampson Goon MD ELECTRONICALLY SIGNED 11/17/2013 20:49

## 2015-11-26 ENCOUNTER — Emergency Department
Admission: EM | Admit: 2015-11-26 | Discharge: 2015-11-26 | Disposition: A | Discharge status: Home / Self Care | Payer: Medicare Other | Attending: Emergency Medicine | Admitting: Emergency Medicine

## 2015-11-26 DIAGNOSIS — E119 Type 2 diabetes mellitus without complications: Secondary | ICD-10-CM | POA: Diagnosis not present

## 2015-11-26 DIAGNOSIS — Z7982 Long term (current) use of aspirin: Secondary | ICD-10-CM | POA: Insufficient documentation

## 2015-11-26 DIAGNOSIS — Z7984 Long term (current) use of oral hypoglycemic drugs: Secondary | ICD-10-CM | POA: Diagnosis not present

## 2015-11-26 DIAGNOSIS — Z7902 Long term (current) use of antithrombotics/antiplatelets: Secondary | ICD-10-CM | POA: Insufficient documentation

## 2015-11-26 DIAGNOSIS — R51 Headache: Secondary | ICD-10-CM | POA: Diagnosis not present

## 2015-11-26 DIAGNOSIS — Z79899 Other long term (current) drug therapy: Secondary | ICD-10-CM | POA: Diagnosis not present

## 2015-11-26 DIAGNOSIS — K0889 Other specified disorders of teeth and supporting structures: Secondary | ICD-10-CM | POA: Insufficient documentation

## 2015-11-26 DIAGNOSIS — Z794 Long term (current) use of insulin: Secondary | ICD-10-CM | POA: Diagnosis not present

## 2015-11-26 DIAGNOSIS — Z79891 Long term (current) use of opiate analgesic: Secondary | ICD-10-CM | POA: Insufficient documentation

## 2015-11-26 MED ORDER — NABUMETONE 750 MG PO TABS: 750.0000 mg | ORAL_TABLET | Freq: Two times a day (BID) | ORAL | Status: DC

## 2015-11-26 MED ORDER — PENICILLIN V POTASSIUM 500 MG PO TABS: 500.0000 mg | ORAL_TABLET | Freq: Four times a day (QID) | ORAL | Status: DC

## 2015-11-26 MED ORDER — LIDOCAINE-EPINEPHRINE 2 %-1:100000 IJ SOLN
1.7000 mL | Freq: Once | INTRAMUSCULAR | Status: AC
  Administered 2015-11-26: 1.7 mL
  Filled 2015-11-26: qty 1.7

## 2015-11-26 NOTE — ED Provider Notes (Signed)
[facility] Emergency Department Provider Note ____________________________________________  Time seen: 1535  I have reviewed the triage vital signs and the nursing notes.  HISTORY  Chief Complaint  Dental Pain and Headache  HPI Austin [name] is a 60 y.o. male visits to the ED for evaluation of right upper jaw pain with associated headache since last night. He denies any recent injury to the mouth, lips, or jaw. He does admit to having one retained molar to the upper jaw. He believes with the pain is coming from. He denies any interim fevers, chills, or sweats.He rates his discomfort at a 10/10 in triage.  Past Medical History  Diagnosis Date  . Diabetes     There are no active problems to display for this patient.   No past surgical history on file.  Current Outpatient Rx  Name  Route  Sig  Dispense  Refill  . ACCU-CHEK AVIVA PLUS test strip               . aspirin EC 81 MG tablet   Oral   Take by mouth.         Marland Kitchen atorvastatin (LIPITOR) 80 MG tablet               . clopidogrel (PLAVIX) 75 MG tablet   Oral   Take by mouth.         Marland Kitchen HYDROcodone-acetaminophen (NORCO/VICODIN) 5-325 MG per tablet   Oral   Take by mouth.         . insulin glargine (LANTUS) 100 UNIT/ML injection   Subcutaneous   Inject into the skin.         Marland Kitchen lisinopril (PRINIVIL,ZESTRIL) 10 MG tablet   Oral   Take by mouth.         . metFORMIN (GLUCOPHAGE) 500 MG tablet   Oral   Take by mouth.         . nabumetone (RELAFEN) 750 MG tablet   Oral   Take 1 tablet (750 mg total) by mouth 2 (two) times daily.   30 tablet   0   . penicillin v potassium (VEETID) 500 MG tablet   Oral   Take 1 tablet (500 mg total) by mouth 4 (four) times daily.   40 tablet   0   . polyethylene glycol-electrolytes (NULYTELY/GOLYTELY) 420 G solution                 Allergies Review of patient's allergies indicates no known allergies.  No family history on  file.  Social History Social History  Substance Use Topics  . Smoking status: Never Smoker   . Smokeless tobacco: Not on file  . Alcohol Use: No   Review of Systems  Constitutional: Negative for fever. Eyes: Negative for visual changes. ENT: Negative for sore throat. Dental pain as above. Cardiovascular: Negative for chest pain. Respiratory: Negative for shortness of breath. Gastrointestinal: Negative for abdominal pain, vomiting and diarrhea. Genitourinary: Negative for dysuria. Musculoskeletal: Negative for back pain. Skin: Negative for rash. Neurological: Negative for headaches, focal weakness or numbness. ____________________________________________  PHYSICAL EXAM:  VITAL SIGNS: ED Triage Vitals  Enc Vitals Group     BP 11/26/15 1425 130/74 mmHg     Pulse Rate 11/26/15 1425 66     Resp 11/26/15 1425 20     Temp 11/26/15 1425 98.8 F (37.1 C)     Temp Source 11/26/15 1425 Oral     SpO2 11/26/15 1425 100 %  Weight 11/26/15 1425 212 lb (96.163 kg)     Height 11/26/15 1425  (1.93 m)     Head Cir --      Peak Flow --      Pain Score 11/26/15 1428 10     Pain Loc --      Pain Edu? --      Excl. in GC? --    Constitutional: Alert and oriented. Well appearing and in no distress. Head: Normocephalic and atraumatic.      Eyes: Conjunctivae are normal. PERRL. Normal extraocular movements      Ears: Canals clear. TMs intact bilaterally.   Nose: No congestion/rhinorrhea.   Mouth/Throat: Mucous membranes are moist. Patient with widespread gingivitis, and gum disease. He is a dentulous to the molars to the lower jaw and has one retained upper molar on the right jaw. This is where he localizes his discomfort. There is no appreciable gum swelling, fluctuance, or abscess formation.   Neck: Supple. No thyromegaly. Hematological/Lymphatic/Immunological: No cervical lymphadenopathy. Cardiovascular: Normal rate, regular rhythm.  Respiratory: Normal respiratory  effort. No wheezes/rales/rhonchi. Gastrointestinal: Soft and nontender. No distention. Musculoskeletal: Nontender with normal range of motion in all extremities.  Neurologic:  Normal gait without ataxia. Normal speech and language. No gross focal neurologic deficits are appreciated. Skin:  Skin is warm, dry and intact. No rash noted. Psychiatric: Mood and affect are normal. Patient exhibits appropriate insight and judgment. ____________________________________________  DENTAL BLOCK  Performed by: Lissa Hoard Consent: Verbal consent obtained. Required items: devices and special equipment available Time out: Immediately prior to procedure a "time out" was called to verify the correct patient, procedure, equipment, support staff and site/side marked as required.  Indication: pain Nerve block body site: right upper 1st molar  Preparation: Patient was prepped and draped in the usual sterile fashion. Needle gauge: 27 G Location technique: anatomical landmarks  Local anesthetic: lidocaine-EPINEPHrine (XYLOCAINE W/EPI) 2 %-1:100000   Anesthetic total: 1 ml  Outcome: pain improved Patient tolerance: Patient tolerated the procedure well with no immediate complications. ____________________________________________  INITIAL IMPRESSION / ASSESSMENT AND PLAN / ED COURSE  Patient with acute dental pain to the right upper jaw. We'll treat empirically for dental abscess with a prescription for Pen-Vee K. He is also provided a perception for Relafen to dose as needed for dental and headache pain. He will follow his primary care provider for further management. A list of dental providers is given for his benefit. ____________________________________________  FINAL CLINICAL IMPRESSION(S) / ED DIAGNOSES  Final diagnoses:  Pain, dental      Lissa Hoard, PA-C 11/26/15 1619  Charlesetta Ivory Jasper, PA-C 11/26/15 1619  Phineas Semen, MD 11/26/15 1720

## 2015-11-26 NOTE — Discharge Instructions (Signed)
You are being treated with antibiotics and pain medicine for your dental pain and headache. You should take the antibiotics until completed. Follow-up with one of the dental clinics listed below.  OPTIONS FOR DENTAL FOLLOW UP CARE  [place] Department of Health and Human Services - Local Safety Net Dental Clinics [email or web address]   [facility] [phone])  [facility] [phone])  [place] [phone])  [facility] [phone])  [facility] [phone]) This clinic caters to the indigent population and is on a lottery system. Location: [employer] [facility], [employer], [address] [place] Clinic Hours: Wednesdays from 6pm - 9pm, patients seen by a lottery system. For dates, call or go to [email or web address] Services: Cleanings, fillings and simple extractions. Payment Options: DENTAL WORK IS FREE OF CHARGE. Bring proof of income or support. Best way to get seen: Arrive at 5:15 pm - this is a lottery, NOT first come/first serve, so arriving earlier will not increase your chances of being seen.     [facility] [phone] Select option 1 for emergencies   Location: [facility], [place], [address] [place] Clinic Hours: No walk-ins accepted - call the day before to schedule an appointment. Check in times are 9:30 am and 1:30 pm. Services: Simple extractions, temporary fillings, pulpectomy/pulp debridement, uncomplicated abscess drainage. Payment Options: PAYMENT IS DUE AT THE TIME OF SERVICE.  Fee is usually $100-200, additional surgical procedures (e.g. abscess drainage) may be extra. Cash, checks, Visa/MasterCard accepted.  Can file Medicaid if patient is covered for dental - patient should call case worker to check. No discount for [facility]  patients. Best way to get seen: MUST call the day before and get onto the schedule. Can usually be seen the next 1-2 days. No walk-ins accepted.     [facility] [phone]   Location: [facility], [address] [place] Clinic Hours: M, W, Th, F 8am or 1:30pm, Tues 9a or 1:30 - first come/first served. Services: Simple extractions, temporary fillings, uncomplicated abscess drainage.  You do not need to be an [facility] resident. Payment Options: PAYMENT IS DUE AT THE TIME OF SERVICE. Dental insurance, otherwise sliding scale - bring proof of income or support. Depending on income and treatment needed, cost is usually $50-200. Best way to get seen: Arrive early as it is first come/first served.     [facility] [phone]   Location: [address] Clinic Hours: Mon-Thu 8a-5p Services: Most basic dental services including extractions and fillings. Payment Options: PAYMENT IS DUE AT THE TIME OF SERVICE. Sliding scale, up to 50% off - bring proof if income or support. Medicaid with dental option accepted. Best way to get seen: Call to schedule an appointment, can usually be seen within 2 weeks OR they will try to see walk-ins - show up at 8a or 2p (you may have to wait).     [facility] [phone] [place] RESIDENTS ONLY   Location: [facility], [address] [place], [place] [address] Clinic Hours: By appointment only. Monday - Thursday 8am-5pm, Friday 8am-12pm Services: Cleanings, fillings, extractions. Payment Options: PAYMENT IS DUE AT THE TIME OF SERVICE. Cash, Visa or MasterCard. Sliding scale - $30 minimum per service. Best way to get seen: Come in to office, complete packet and make an appointment - need proof of income or support monies for each  household member and proof of [facility] [place] residence. Usually takes about a month to  get in.     [facility] [phone]   Location: [address] [facility] Hours: Walk-in Urgent Care Dental Services are offered Monday-Friday mornings only. The numbers of emergencies accepted daily is limited to the number of providers available. Maximum 15 - Mondays, Wednesdays & Thursdays Maximum 10 - Tuesdays & Fridays Services: You do not need to be a [place] resident to be seen for a dental emergency. Emergencies are defined as pain, swelling, abnormal bleeding, or dental trauma. Walkins will receive x-rays if needed. NOTE: Dental cleaning is not an emergency. Payment Options: PAYMENT IS DUE AT THE TIME OF SERVICE. Minimum co-pay is $40.00 for uninsured patients. Minimum co-pay is $3.00 for Medicaid with dental coverage. Dental Insurance is accepted and must be presented at time of visit. Medicare does not cover dental. Forms of payment: Cash, credit card, checks. Best way to get seen: If not previously registered with the clinic, walk-in dental registration begins at 7:15 am and is on a first come/first serve basis. If previously registered with the clinic, call to make an appointment.     [facility] [phone] [place] RESIDENTS ONLY   Location: [address], [place], [place] Clinic Hours: Mon-Thu 10a-2p Services: Extractions only! Payment Options: FREE (donations accepted) - bring proof of income or support Best way to get seen: Call and schedule an appointment OR come at 8am on the 1st Monday of every month (except for holidays) when it is first come/first served.     [facility] [phone]   Location: [address], [place] Clinic Hours: Friday mornings Services, Payment Options, Best way to get seen: Call for info

## 2015-11-26 NOTE — ED Notes (Addendum)
Pt reports tooth pain to right side that started last night. Pt states the pain is so bad it had caused a caused throbbing headache. Pt states he tried Tylenol, Oragel and Ibuprofen with no relief.

## 2017-05-22 ENCOUNTER — Other Ambulatory Visit (INDEPENDENT_AMBULATORY_CARE_PROVIDER_SITE_OTHER): Payer: Self-pay | Admitting: Vascular Surgery

## 2017-05-22 ENCOUNTER — Encounter (INDEPENDENT_AMBULATORY_CARE_PROVIDER_SITE_OTHER): Payer: Self-pay | Admitting: Vascular Surgery

## 2017-05-22 ENCOUNTER — Ambulatory Visit (INDEPENDENT_AMBULATORY_CARE_PROVIDER_SITE_OTHER): Payer: Medicare Other

## 2017-05-22 ENCOUNTER — Ambulatory Visit (INDEPENDENT_AMBULATORY_CARE_PROVIDER_SITE_OTHER): Payer: Medicare Other | Admitting: Vascular Surgery

## 2017-05-22 DIAGNOSIS — I70213 Atherosclerosis of native arteries of extremities with intermittent claudication, bilateral legs: Secondary | ICD-10-CM | POA: Diagnosis not present

## 2017-05-22 DIAGNOSIS — Z794 Long term (current) use of insulin: Secondary | ICD-10-CM

## 2017-05-22 DIAGNOSIS — E1151 Type 2 diabetes mellitus with diabetic peripheral angiopathy without gangrene: Secondary | ICD-10-CM | POA: Diagnosis not present

## 2017-05-22 DIAGNOSIS — I10 Essential (primary) hypertension: Secondary | ICD-10-CM

## 2017-05-22 DIAGNOSIS — R52 Pain, unspecified: Secondary | ICD-10-CM

## 2017-05-22 DIAGNOSIS — E782 Mixed hyperlipidemia: Secondary | ICD-10-CM

## 2017-05-25 DIAGNOSIS — I70219 Atherosclerosis of native arteries of extremities with intermittent claudication, unspecified extremity: Secondary | ICD-10-CM | POA: Insufficient documentation

## 2017-05-25 DIAGNOSIS — E785 Hyperlipidemia, unspecified: Secondary | ICD-10-CM | POA: Insufficient documentation

## 2017-05-25 DIAGNOSIS — E119 Type 2 diabetes mellitus without complications: Secondary | ICD-10-CM | POA: Insufficient documentation

## 2017-05-25 DIAGNOSIS — I10 Essential (primary) hypertension: Secondary | ICD-10-CM | POA: Insufficient documentation

## 2017-05-25 NOTE — Progress Notes (Signed)
MRN : [id number]  Austin [name] is a 61 y.o. (09-21-1956) male who presents with chief complaint of  Chief Complaint  Patient presents with  . New Patient (Initial Visit)  .  History of Present Illness: The patient is seen for evaluation of painful lower extremities and diminished pulses. Patient notes the pain is always associated with activity and is very consistent day today. Typically, the pain occurs at less than one block, progress is as activity continues to the point that the patient must stop walking. Resting including standing still for several minutes allowed resumption of the activity and the ability to walk a similar distance before stopping again. Uneven terrain and inclined shorten the distance. The pain has been progressive over the past several years. The patient states the inability to walk is now having a profound negative impact on quality of life and daily activities.  The patient denies rest pain or dangling of an extremity off the side of the bed during the night for relief. No open wounds or sores at this time. No prior interventions or surgeries.  No history of back problems or DJD of the lumbar sacral spine.   The patient denies changes in claudication symptoms or new rest pain symptoms.  No new ulcers or wounds of the foot.  [name] blood pressure has been stable and relatively well controlled. The patient denies amaurosis fugax or recent TIA symptoms. There are no recent neurological changes noted. The patient denies history of DVT, PE or superficial thrombophlebitis. The patient denies recent episodes of angina or shortness of breath.   Current Meds  Medication Sig  . aspirin EC 81 MG tablet Take by mouth.  Marland Kitchen atorvastatin (LIPITOR) 80 MG tablet   . clopidogrel (PLAVIX) 75 MG tablet Take by mouth.  Marland Kitchen lisinopril (PRINIVIL,ZESTRIL) 10 MG tablet Take by mouth.  . metFORMIN (GLUCOPHAGE) 500 MG tablet Take by mouth.    Past Medical History:    Diagnosis Date  . Diabetes (HCC)   . DVT (deep venous thrombosis) (HCC)     Past Surgical History:  Procedure Laterality Date  . groin surgery    . NECK SURGERY      Social History Social History  Substance Use Topics  . Smoking status: Light Tobacco Smoker  . Smokeless tobacco: Never Used  . Alcohol use No    Family History Family History  Problem Relation Age of Onset  . Diabetes Mother   . Coronary artery disease Father   . Hypertension Sister   . Leukemia Brother   No family history of bleeding/clotting disorders, porphyria or autoimmune disease   No Known Allergies   REVIEW OF SYSTEMS (Negative unless checked)  Constitutional: [] Weight loss  [] Fever  [] Chills Cardiac: [] Chest pain   [] Chest pressure   [] Palpitations   [] Shortness of breath when laying flat   [] Shortness of breath with exertion. Vascular:  [x] Pain in legs with walking   [] Pain in legs at rest  [] History of DVT   [] Phlebitis   [] Swelling in legs   [] Varicose veins   [] Non-healing ulcers Pulmonary:   [] Uses home oxygen   [] Productive cough   [] Hemoptysis   [] Wheeze  [] COPD   [] Asthma Neurologic:  [] Dizziness   [] Seizures   [] History of stroke   [] History of TIA  [] Aphasia   [] Vissual changes   [] Weakness or numbness in arm   [] Weakness or numbness in leg Musculoskeletal:   [] Joint swelling   [] Joint pain   [] Low back pain  Hematologic:  [] Easy bruising  [] Easy bleeding   [] Hypercoagulable state   [] Anemic Gastrointestinal:  [] Diarrhea   [] Vomiting  [] Gastroesophageal reflux/heartburn   [] Difficulty swallowing. Genitourinary:  [] Chronic kidney disease   [] Difficult urination  [] Frequent urination   [] Blood in urine Skin:  [] Rashes   [] Ulcers  Psychological:  [] History of anxiety   []  History of major depression.  Physical Examination  Vitals:   05/22/17 1641  BP: 110/64  Pulse: 71  Resp: 17  Weight: 85.7 kg (189 lb)  Height: 6\' 4"  (1.93 m)   Body mass index is 23.01 kg/m. Gen: WD/WN,  NAD Head: [place]/AT, No temporalis wasting.  Ear/Nose/Throat: Hearing grossly intact, nares w/o erythema or drainage, poor dentition Eyes: PER, EOMI, sclera nonicteric.  Neck: Supple, no masses.  No bruit or JVD.  Pulmonary:  Good air movement, clear to auscultation bilaterally, no use of accessory muscles.  Cardiac: RRR, normal S1, S2, no Murmurs. Vascular:  Vessel Right Left  Radial Palpable Palpable  Popliteal Not Palpable Not Palpable  PT Not Palpable Not Palpable  DP Not Palpable Not Palpable   Gastrointestinal: soft, non-distended. No guarding/no peritoneal signs.  Musculoskeletal: M/S 5/5 throughout.  No deformity or atrophy.  Neurologic: CN 2-12 intact. Pain and light touch intact in extremities.  Symmetrical.  Speech is fluent. Motor exam as listed above. Psychiatric: Judgment intact, Mood & affect appropriate for pt's clinical situation. Dermatologic: No rashes or ulcers noted.  No changes consistent with cellulitis. Lymph : No Cervical lymphadenopathy, no lichenification or skin changes of chronic lymphedema.  CBC Lab Results  Component Value Date   WBC 9.1 11/17/2013   HGB 14.1 11/17/2013   HCT 40.1 11/17/2013   MCV 86 11/17/2013   PLT 218 11/17/2013    BMET    Component Value Date/Time   NA 135 (L) 11/17/2013 0621   K 4.3 11/17/2013 0621   CL 99 11/17/2013 0621   CO2 31 11/17/2013 0621   GLUCOSE 172 (H) 11/17/2013 0621   BUN 10 11/17/2013 0621   CREATININE 0.85 11/17/2013 0621   CALCIUM 9.6 11/17/2013 0621   GFRNONAA >60 11/17/2013 0621   GFRAA >60 11/17/2013 0621   CrCl cannot be calculated (Patient's most recent lab result is older than the maximum 21 days allowed.).  COAG No results found for: INR, PROTIME  Radiology No results found.  Assessment/Plan 1. Atherosclerosis of native artery of both lower extremities with intermittent claudication (HCC)  Recommend:  The patient has evidence of atherosclerosis of the lower extremities with claudication.   The patient does not voice lifestyle limiting changes at this point in time.  Noninvasive studies do not suggest clinically significant change.  No invasive studies, angiography or surgery at this time The patient should continue walking and begin a more formal exercise program.  The patient should continue antiplatelet therapy and aggressive treatment of the lipid abnormalities  No changes in the patient's medications at this time  The patient should continue wearing graduated compression socks 10-15 mmHg strength to control the mild edema.   - VAS [place] ABI WITH/WO TBI; Future  2. Type 2 diabetes mellitus with diabetic peripheral angiopathy without gangrene, with long-term current use of insulin (HCC) Continue hypoglycemic medications as already ordered, these medications have been reviewed and there are no changes at this time.  Hgb A1C to be monitored as already arranged by primary service   3. Essential hypertension Continue antihypertensive medications as already ordered, these medications have been reviewed and there are no changes at  this time.   4. Mixed hyperlipidemia Continue statin as ordered and reviewed, no changes at this time     [name], MD  05/25/2017 2:30 PM

## 2017-10-17 ENCOUNTER — Encounter: Payer: Self-pay | Admitting: Emergency Medicine

## 2017-10-17 ENCOUNTER — Ambulatory Visit
Admission: EM | Admit: 2017-10-17 | Discharge: 2017-10-17 | Disposition: A | Discharge status: Home / Self Care | Payer: Medicare Other | Attending: Family Medicine | Admitting: Family Medicine

## 2017-10-17 ENCOUNTER — Other Ambulatory Visit: Payer: Self-pay

## 2017-10-17 DIAGNOSIS — S39012A Strain of muscle, fascia and tendon of lower back, initial encounter: Secondary | ICD-10-CM

## 2017-10-17 DIAGNOSIS — M545 Low back pain: Secondary | ICD-10-CM | POA: Diagnosis not present

## 2017-10-17 MED ORDER — TIZANIDINE HCL 4 MG PO CAPS: 4.0000 mg | ORAL_CAPSULE | Freq: Three times a day (TID) | ORAL | 0 refills | Status: DC

## 2017-10-17 MED ORDER — KETOROLAC TROMETHAMINE 60 MG/2ML IM SOLN
30.0000 mg | Freq: Once | INTRAMUSCULAR | Status: AC
  Administered 2017-10-17: 30 mg via INTRAMUSCULAR

## 2017-10-17 MED ORDER — NAPROXEN 500 MG PO TABS: 500.0000 mg | ORAL_TABLET | Freq: Two times a day (BID) | ORAL | 0 refills | Status: DC

## 2017-10-17 NOTE — ED Provider Notes (Signed)
[facility]    CSN: [id number] Arrival date & time: 10/17/17  1204     History   Chief Complaint Chief Complaint  Patient presents with  . Back Pain    HPI HANDY MCLOUD is a 62 y.o. male.   HPI  62 year old male presents with a history of severe low back pain that radiates into both of his lower extremities at the level of the knee.  He has no injury that he has noted.  He states that he has been seeing a primary practitioner for about 2 months complaining of low back pain but they keep telling him that there is nothing wrong with his back.  He denies any incontinence.  He has a history of diabetes and atherosclerosis of his lower extremities with claudication.  He ambulates with the aid of a walker.  Patient has extremely poor body habitus.          Past Medical History:  Diagnosis Date  . Diabetes (HCC)   . DVT (deep venous thrombosis) [facility])     Patient Active Problem List   Diagnosis Date Noted  . Atherosclerosis of native arteries of extremity with intermittent claudication (HCC) 05/25/2017  . Diabetes (HCC) 05/25/2017  . Essential hypertension 05/25/2017  . Hyperlipidemia 05/25/2017    Past Surgical History:  Procedure Laterality Date  . groin surgery    . NECK SURGERY         Home Medications    Prior to Admission medications   Medication Sig Start Date End Date Taking? Authorizing Provider  atorvastatin (LIPITOR) 80 MG tablet  04/13/14  Yes [provider]  Cholecalciferol (VITAMIN D3 PO) Take by mouth daily.   Yes [provider]  clopidogrel (PLAVIX) 75 MG tablet Take by mouth.   Yes [provider]  lisinopril (PRINIVIL,ZESTRIL) 10 MG tablet Take by mouth.   Yes [provider]  metFORMIN (GLUCOPHAGE) 500 MG tablet Take by mouth.   Yes [provider]  ACCU-CHEK AVIVA PLUS test strip  03/14/14   [provider]  naproxen (NAPROSYN) 500 MG tablet Take 1 tablet (500 mg total) by  mouth 2 (two) times daily with a meal. 10/17/17   Lutricia Feil, PA-C  tiZANidine (ZANAFLEX) 4 MG capsule Take 1 capsule (4 mg total) by mouth 3 (three) times daily. 10/17/17   Lutricia Feil, PA-C    Family History Family History  Problem Relation Age of Onset  . Diabetes Mother   . Coronary artery disease Father   . Hypertension Sister   . Leukemia Brother     Social History Social History   Tobacco Use  . Smoking status: Light Tobacco Smoker  . Smokeless tobacco: Never Used  Substance Use Topics  . Alcohol use: No  . Drug use: No     Allergies   Patient has no known allergies.   Review of Systems Review of Systems  Constitutional: Positive for activity change. Negative for chills, fatigue and fever.  Musculoskeletal: Positive for back pain and gait problem.  All other systems reviewed and are negative.    Physical Exam Triage Vital Signs ED Triage Vitals  Enc Vitals Group     BP 10/17/17 1222 127/72     Pulse Rate 10/17/17 1222 64     Resp 10/17/17 1222 16     Temp 10/17/17 1222 97.6 F (36.4 C)     Temp Source 10/17/17 1222 Oral     SpO2 10/17/17 1222 100 %  Weight 10/17/17 1223 200 lb (90.7 kg)     Height 10/17/17 1223 6\' 4"  (1.93 m)     Head Circumference --      Peak Flow --      Pain Score 10/17/17 1223 10     Pain Loc --      Pain Edu? --      Excl. in GC? --    No data found.  Updated Vital Signs BP 127/72 (BP Location: Left Arm)   Pulse 64   Temp 97.6 F (36.4 C) (Oral)   Resp 16   Ht 6\' 4"  (1.93 m)   Wt 200 lb (90.7 kg)   SpO2 100%   BMI 24.34 kg/m   Visual Acuity Right Eye Distance:   Left Eye Distance:   Bilateral Distance:    Right Eye Near:   Left Eye Near:    Bilateral Near:     Physical Exam  Constitutional: He is oriented to person, place, and time. He appears well-developed and well-nourished. No distress.  HENT:  Head: Normocephalic.  Eyes: Pupils are equal, round, and reactive to light.  Neck: Normal  range of motion.  Musculoskeletal: He exhibits tenderness.  Examination of the lumbar spine shows spasm in the lower segments bilaterally.  I His maximal pain is indicated at approximately the L3- 4 level. He has great difficulty participating with the examination.  He tends to hang on all the time with any motion.  Yet at other times he was seen by the nurse moving freely to pick up his phone.  He complained of being in very severe pain and his primary care physician would not issue any medications according to the patient.  He was obviously unable to undergo a full examination.  DTRs are 2+/4 and symmetrical.  Straight leg raise testing was negative at 90 degrees in the sitting position bilaterally.  EHL peroneal and anterior tibialis muscle was strong to clinical testing.  Neurological: He is alert and oriented to person, place, and time.  Skin: Skin is warm and dry. He is not diaphoretic.  Psychiatric: He has a normal mood and affect. His behavior is normal. Judgment and thought content normal.  Nursing note and vitals reviewed.    UC Treatments / Results  Labs (all labs ordered are listed, but only abnormal results are displayed) Labs Reviewed - No data to display  EKG  EKG Interpretation None       Radiology No results found.  Procedures Procedures (including critical care time)  Medications Ordered in UC Medications  ketorolac (TORADOL) injection 30 mg (30 mg Intramuscular Given 10/17/17 1309)     Initial Impression / Assessment and Plan / UC Course  I have reviewed the triage vital signs and the nursing notes.  Pertinent labs & imaging results that were available during my care of the patient were reviewed by me and considered in my medical decision making (see chart for details).     Plan: 1. Test/x-ray results and diagnosis reviewed with patient 2. rx as per orders; risks, benefits, potential side effects reviewed with patient 3. Recommend supportive treatment  with anti-inflammatory and muscle relaxers.  Nausea and not to sit for prolonged periods pinned or lift.  He will need to follow-up with his primary care physician for possible further evaluation and imaging provided through him.  He was given an injection of Toradol today and will start his medications with dinnertime.  If he worsens he should go the emergency room. 4. F/u  prn if symptoms worsen or don't improve   Final Clinical Impressions(s) / UC Diagnoses   Final diagnoses:  Strain of lumbar region, initial encounter    ED Discharge Orders        Ordered    naproxen (NAPROSYN) 500 MG tablet  2 times daily with meals     10/17/17 1305    tiZANidine (ZANAFLEX) 4 MG capsule  3 times daily     10/17/17 1305       Controlled Substance Prescriptions [place] Controlled Substance Registry consulted? Not Applicable   Lutricia FeilRoemer, Tanaysha Alkins P, PA-C 10/17/17 1315

## 2017-10-17 NOTE — Discharge Instructions (Signed)
Avoid prolonged sitting lifting or bending.  Recommend following up with your primary care physician in 1 week.  May require additional evaluation and imaging through your primary care

## 2017-10-17 NOTE — ED Triage Notes (Signed)
Patient in today c/o severe low back pain since yesterday. No injury noted. No history of previous back problems.

## 2017-10-20 ENCOUNTER — Telehealth: Payer: Self-pay | Admitting: Emergency Medicine

## 2017-10-20 NOTE — Telephone Encounter (Signed)
Called to follow up with patient after his recent visit. Left message on home voice mail to call with any questions or concerns.

## 2017-11-11 ENCOUNTER — Telehealth: Payer: Self-pay | Admitting: Emergency Medicine

## 2017-11-27 ENCOUNTER — Ambulatory Visit (INDEPENDENT_AMBULATORY_CARE_PROVIDER_SITE_OTHER): Payer: Medicare Other | Admitting: Vascular Surgery

## 2017-11-27 ENCOUNTER — Encounter (INDEPENDENT_AMBULATORY_CARE_PROVIDER_SITE_OTHER): Payer: Medicare Other

## 2018-02-09 ENCOUNTER — Other Ambulatory Visit: Payer: Self-pay

## 2018-02-09 ENCOUNTER — Encounter: Payer: Self-pay | Admitting: Specialist

## 2018-02-09 ENCOUNTER — Inpatient Hospital Stay
Admission: AD | Admit: 2018-02-09 | Discharge: 2018-02-12 | DRG: 253 | Disposition: A | Discharge status: Home / Self Care | Payer: Medicare Other | Source: Ambulatory Visit | Attending: Internal Medicine | Admitting: Internal Medicine

## 2018-02-09 DIAGNOSIS — E1169 Type 2 diabetes mellitus with other specified complication: Secondary | ICD-10-CM | POA: Diagnosis present

## 2018-02-09 DIAGNOSIS — Z8249 Family history of ischemic heart disease and other diseases of the circulatory system: Secondary | ICD-10-CM

## 2018-02-09 DIAGNOSIS — I10 Essential (primary) hypertension: Secondary | ICD-10-CM | POA: Diagnosis not present

## 2018-02-09 DIAGNOSIS — Z7902 Long term (current) use of antithrombotics/antiplatelets: Secondary | ICD-10-CM

## 2018-02-09 DIAGNOSIS — M869 Osteomyelitis, unspecified: Secondary | ICD-10-CM | POA: Diagnosis present

## 2018-02-09 DIAGNOSIS — M79605 Pain in left leg: Secondary | ICD-10-CM | POA: Diagnosis not present

## 2018-02-09 DIAGNOSIS — I96 Gangrene, not elsewhere classified: Secondary | ICD-10-CM | POA: Diagnosis not present

## 2018-02-09 DIAGNOSIS — Z7984 Long term (current) use of oral hypoglycemic drugs: Secondary | ICD-10-CM | POA: Diagnosis not present

## 2018-02-09 DIAGNOSIS — I482 Chronic atrial fibrillation: Secondary | ICD-10-CM | POA: Diagnosis present

## 2018-02-09 DIAGNOSIS — F17210 Nicotine dependence, cigarettes, uncomplicated: Secondary | ICD-10-CM | POA: Diagnosis present

## 2018-02-09 DIAGNOSIS — L03116 Cellulitis of left lower limb: Secondary | ICD-10-CM | POA: Diagnosis present

## 2018-02-09 DIAGNOSIS — I70202 Unspecified atherosclerosis of native arteries of extremities, left leg: Secondary | ICD-10-CM | POA: Diagnosis present

## 2018-02-09 DIAGNOSIS — Z86718 Personal history of other venous thrombosis and embolism: Secondary | ICD-10-CM | POA: Diagnosis not present

## 2018-02-09 DIAGNOSIS — Z79899 Other long term (current) drug therapy: Secondary | ICD-10-CM

## 2018-02-09 DIAGNOSIS — Z833 Family history of diabetes mellitus: Secondary | ICD-10-CM

## 2018-02-09 DIAGNOSIS — E119 Type 2 diabetes mellitus without complications: Secondary | ICD-10-CM | POA: Diagnosis not present

## 2018-02-09 DIAGNOSIS — E782 Mixed hyperlipidemia: Secondary | ICD-10-CM | POA: Diagnosis present

## 2018-02-09 DIAGNOSIS — E1152 Type 2 diabetes mellitus with diabetic peripheral angiopathy with gangrene: Secondary | ICD-10-CM | POA: Diagnosis present

## 2018-02-09 DIAGNOSIS — M79672 Pain in left foot: Secondary | ICD-10-CM | POA: Diagnosis present

## 2018-02-09 DIAGNOSIS — Z791 Long term (current) use of non-steroidal anti-inflammatories (NSAID): Secondary | ICD-10-CM | POA: Diagnosis not present

## 2018-02-09 DIAGNOSIS — I70262 Atherosclerosis of native arteries of extremities with gangrene, left leg: Secondary | ICD-10-CM | POA: Diagnosis not present

## 2018-02-09 HISTORY — DX: Gangrene, not elsewhere classified: I96

## 2018-02-09 LAB — CBC
HEMATOCRIT: 41.5 % (ref 40.0–52.0)
Hemoglobin: 14.6 g/dL (ref 13.0–18.0)
MCH: 30.7 pg (ref 26.0–34.0)
MCHC: 35.2 g/dL (ref 32.0–36.0)
MCV: 87.4 fL (ref 80.0–100.0)
Platelets: 157 10*3/uL (ref 150–440)
RBC: 4.75 MIL/uL (ref 4.40–5.90)
RDW: 13 % (ref 11.5–14.5)
WBC: 6.1 10*3/uL (ref 3.8–10.6)

## 2018-02-09 LAB — GLUCOSE, CAPILLARY
GLUCOSE-CAPILLARY: 294 mg/dL — AB (ref 65–99)
Glucose-Capillary: 375 mg/dL — ABNORMAL HIGH (ref 65–99)

## 2018-02-09 LAB — BASIC METABOLIC PANEL
Anion gap: 8 (ref 5–15)
BUN: 8 mg/dL (ref 6–20)
CHLORIDE: 98 mmol/L — AB (ref 101–111)
CO2: 29 mmol/L (ref 22–32)
Calcium: 9 mg/dL (ref 8.9–10.3)
Creatinine, Ser: 0.89 mg/dL (ref 0.61–1.24)
GFR calc Af Amer: >60 mL/min (ref >60)
GFR calc non Af Amer: >60 mL/min (ref >60)
GLUCOSE: 415 mg/dL — AB (ref 65–99)
POTASSIUM: 3.8 mmol/L (ref 3.5–5.1)
Sodium: 135 mmol/L (ref 135–145)

## 2018-02-09 LAB — SURGICAL PCR SCREEN
MRSA, PCR: NEGATIVE
STAPHYLOCOCCUS AUREUS: NEGATIVE

## 2018-02-09 MED ORDER — VANCOMYCIN HCL 10 G IV SOLR
1500.0000 mg | Freq: Two times a day (BID) | INTRAVENOUS | Status: DC
  Administered 2018-02-10 – 2018-02-12 (×5): 1500 mg via INTRAVENOUS
  Filled 2018-02-09 (×7): qty 1500

## 2018-02-09 MED ORDER — ACETAMINOPHEN 325 MG PO TABS
650.0000 mg | ORAL_TABLET | Freq: Four times a day (QID) | ORAL | Status: DC | PRN
  Administered 2018-02-09: 650 mg via ORAL
  Filled 2018-02-09: qty 2

## 2018-02-09 MED ORDER — ONDANSETRON HCL 4 MG PO TABS: 4.0000 mg | ORAL_TABLET | Freq: Four times a day (QID) | ORAL | Status: DC | PRN

## 2018-02-09 MED ORDER — PIPERACILLIN-TAZOBACTAM 3.375 G IVPB
3.3750 g | Freq: Three times a day (TID) | INTRAVENOUS | Status: DC
  Administered 2018-02-09 – 2018-02-12 (×8): 3.375 g via INTRAVENOUS
  Filled 2018-02-09 (×7): qty 50

## 2018-02-09 MED ORDER — CLOPIDOGREL BISULFATE 75 MG PO TABS: 75.0000 mg | ORAL_TABLET | Freq: Every day | ORAL | Status: DC

## 2018-02-09 MED ORDER — VANCOMYCIN HCL IN DEXTROSE 1-5 GM/200ML-% IV SOLN
1000.0000 mg | Freq: Once | INTRAVENOUS | Status: AC
  Administered 2018-02-09: 1000 mg via INTRAVENOUS
  Filled 2018-02-09: qty 200

## 2018-02-09 MED ORDER — ACETAMINOPHEN 650 MG RE SUPP: 650.0000 mg | Freq: Four times a day (QID) | RECTAL | Status: DC | PRN

## 2018-02-09 MED ORDER — ONDANSETRON HCL 4 MG/2ML IJ SOLN: 4.0000 mg | Freq: Four times a day (QID) | INTRAMUSCULAR | Status: DC | PRN

## 2018-02-09 MED ORDER — INSULIN ASPART 100 UNIT/ML ~~LOC~~ SOLN
0.0000 [IU] | Freq: Three times a day (TID) | SUBCUTANEOUS | Status: DC
  Administered 2018-02-10: 2 [IU] via SUBCUTANEOUS
  Administered 2018-02-10 (×2): 3 [IU] via SUBCUTANEOUS
  Administered 2018-02-11: 5 [IU] via SUBCUTANEOUS
  Administered 2018-02-11: 3 [IU] via SUBCUTANEOUS
  Administered 2018-02-11: 7 [IU] via SUBCUTANEOUS
  Administered 2018-02-12: 2 [IU] via SUBCUTANEOUS
  Filled 2018-02-09 (×7): qty 1

## 2018-02-09 MED ORDER — ENOXAPARIN SODIUM 40 MG/0.4ML ~~LOC~~ SOLN: 40.0000 mg | SUBCUTANEOUS | Status: DC

## 2018-02-09 MED ORDER — NICOTINE 21 MG/24HR TD PT24
21.0000 mg | MEDICATED_PATCH | Freq: Every day | TRANSDERMAL | Status: DC
  Administered 2018-02-09 – 2018-02-11 (×3): 21 mg via TRANSDERMAL
  Filled 2018-02-09 (×3): qty 1

## 2018-02-09 MED ORDER — ATORVASTATIN CALCIUM 20 MG PO TABS
80.0000 mg | ORAL_TABLET | Freq: Every day | ORAL | Status: DC
  Administered 2018-02-09 – 2018-02-11 (×3): 80 mg via ORAL
  Filled 2018-02-09 (×3): qty 4

## 2018-02-09 MED ORDER — INSULIN ASPART 100 UNIT/ML ~~LOC~~ SOLN
0.0000 [IU] | Freq: Every day | SUBCUTANEOUS | Status: DC
  Administered 2018-02-09 – 2018-02-10 (×2): 3 [IU] via SUBCUTANEOUS
  Administered 2018-02-11: 2 [IU] via SUBCUTANEOUS
  Filled 2018-02-09 (×3): qty 1

## 2018-02-09 MED ORDER — LISINOPRIL 10 MG PO TABS
10.0000 mg | ORAL_TABLET | Freq: Every day | ORAL | Status: DC
  Administered 2018-02-11: 10 mg via ORAL
  Filled 2018-02-09: qty 1

## 2018-02-09 NOTE — H&P (Addendum)
[facility] -  at [facility]   PATIENT NAME: Austin [name]    MR#:  [id number]  DATE OF BIRTH:  1955/12/30  DATE OF ADMISSION:  02/09/2018  PRIMARY CARE PHYSICIAN: Neita Goodnight Presley Raddle, MD   REQUESTING/REFERRING PHYSICIAN: Dr. Leward Quan  CHIEF COMPLAINT:  No chief complaint on file.   HISTORY OF PRESENT ILLNESS:  Austin [name]  is a 62 y.o. male with a known history of diabetes, hypertension, peripheral vascular disease who presents to the hospital from the podiatrist office due to left second toe gangrene.  Patient says that a few weeks ago his toenail fell off on the second toe on the left foot.  Over the past few days his left toe has become more black in color and he has developed significant pain in that left foot and is having difficulty putting any weight on it.  He went to see his podiatrist who noticed that he had gangrene with some ascending cellulitis on the left foot.  He was directly admitted to the hospital for treatment for underlying osteomyelitis with possible need for amputation.  Patient presently denies any other associated symptoms of fevers, chills, nausea, vomiting, chest pain, shortness of breath.    PAST MEDICAL HISTORY:   Past Medical History:  Diagnosis Date  . Diabetes (HCC)   . DVT (deep venous thrombosis) (HCC)     PAST SURGICAL HISTORY:   Past Surgical History:  Procedure Laterality Date  . groin surgery    . NECK SURGERY      SOCIAL HISTORY:   Social History   Tobacco Use  . Smoking status: Heavy Tobacco Smoker    Packs/day: 1.00    Years: 30.00    Pack years: 30.00    Types: Cigarettes  . Smokeless tobacco: Never Used  Substance Use Topics  . Alcohol use: No    FAMILY HISTORY:   Family History  Problem Relation Age of Onset  . Diabetes Mother   . Coronary artery disease Father   . Hypertension Sister   . Leukemia Brother     DRUG ALLERGIES:  No Known Allergies  REVIEW OF SYSTEMS:   Review  of Systems  Constitutional: Negative for fever and weight loss.  HENT: Negative for congestion, nosebleeds and tinnitus.   Eyes: Negative for blurred vision, double vision and redness.  Respiratory: Negative for cough, hemoptysis and shortness of breath.   Cardiovascular: Negative for chest pain, orthopnea, leg swelling and PND.  Gastrointestinal: Negative for abdominal pain, diarrhea, melena, nausea and vomiting.  Genitourinary: Negative for dysuria, hematuria and urgency.  Musculoskeletal: Positive for joint pain (Left foot). Negative for falls.  Neurological: Negative for dizziness, tingling, sensory change, focal weakness, seizures, weakness and headaches.  Endo/Heme/Allergies: Negative for polydipsia. Does not bruise/bleed easily.  Psychiatric/Behavioral: Negative for depression and memory loss. The patient is not nervous/anxious.     MEDICATIONS AT HOME:   Prior to Admission medications   Medication Sig Start Date End Date Taking? Authorizing Provider  ACCU-CHEK AVIVA PLUS test strip  03/14/14   [provider]  atorvastatin (LIPITOR) 80 MG tablet  04/13/14   [provider]  Cholecalciferol (VITAMIN D3 PO) Take by mouth daily.    [provider]  clopidogrel (PLAVIX) 75 MG tablet Take by mouth.    [provider]  lisinopril (PRINIVIL,ZESTRIL) 10 MG tablet Take by mouth.    [provider]  metFORMIN (GLUCOPHAGE) 500 MG tablet Take by mouth.    [provider]  naproxen (  NAPROSYN) 500 MG tablet Take 1 tablet (500 mg total) by mouth 2 (two) times daily with a meal. 10/17/17   Lutricia Feil, PA-C      VITAL SIGNS:  Blood pressure (!) 132/92, pulse 70, temperature 97.7 F (36.5 C), temperature source Oral, resp. rate 16, SpO2 99 %.  PHYSICAL EXAMINATION:  Physical Exam  GENERAL:  62 y.o.-year-old patient lying in the bed in no acute distress.  EYES: Pupils equal, round, reactive to light and accommodation. No scleral  icterus. Extraocular muscles intact.  HEENT: Head atraumatic, normocephalic. Oropharynx and nasopharynx clear. No oropharyngeal erythema, moist oral mucosa  NECK:  Supple, no jugular venous distention. No thyroid enlargement, no tenderness.  LUNGS: Normal breath sounds bilaterally, no wheezing, rales, rhonchi. No use of accessory muscles of respiration.  CARDIOVASCULAR: S1, S2 RRR. No murmurs, rubs, gallops, clicks.  ABDOMEN: Soft, nontender, nondistended. Bowel sounds present. No organomegaly or mass.  EXTREMITIES: No pedal edema, clubbing. + 2 pedal & radial pulses b/l.  Left second toe gangrene noted with ascending redness on the left dorsum part of the foot. NEUROLOGIC: Cranial nerves II through XII are intact. No focal Motor or sensory deficits appreciated b/l PSYCHIATRIC: The patient is alert and oriented x 3. Good affect.  SKIN: No obvious rash, lesion, or ulcer.   LABORATORY PANEL:   CBC Recent Labs  Lab 02/09/18 1634  WBC 6.1  HGB 14.6  HCT 41.5  PLT 157   ------------------------------------------------------------------------------------------------------------------  Chemistries  Recent Labs  Lab 02/09/18 1634  NA 135  K 3.8  CL 98*  CO2 29  GLUCOSE 415*  BUN 8  CREATININE 0.89  CALCIUM 9.0   ------------------------------------------------------------------------------------------------------------------  Cardiac Enzymes No results for input(s): TROPONINI in the last 168 hours. ------------------------------------------------------------------------------------------------------------------  RADIOLOGY:  No results found.   IMPRESSION AND PLAN:   62 year old male with past medical history of diabetes, hypertension, peripheral vascular disease, ongoing tobacco abuse to the hospital directly admitted from his podiatrist office due to left second toe gangrene.  1.  Left foot cellulitis/gangrene- patient will be treated with broad-spectrum IV antibiotics  with vancomycin, Zosyn. - We will get podiatry consult and patient likely to get amputation tomorrow.  We will also consult vascular surgery. - Follow clinically.  2.  Diabetes type 2 without complication- hold metformin, place and sliding scale insulin.  Follow blood sugars.  3.  Hyperlipidemia-continue atorvastatin.  4.  History of peripheral vascular disease/PAD- continue Plavix, statin.  Await further vascular surgery input.  5.  Essential hypertension-continue lisinopril.  6. Tobacco abuse - will place pt. On Nicotine patch.   All the records are reviewed and case discussed with ED provider. Management plans discussed with the patient, family and they are in agreement.  CODE STATUS: Full code  TOTAL TIME TAKING CARE OF THIS PATIENT: 40 minutes.    Houston Siren M.D on 02/09/2018 at 5:26 PM  Between 7am to 6pm - Pager - [phone]  After 6pm go to [email or web address] - password [email or web address] [facility]  [facility]  Office  [phone]  CC: Primary care physician; Preston Fleeting, MD

## 2018-02-09 NOTE — Consult Note (Signed)
[facility]                                                      Patient Demographics  Austin [name], is a 61 y.o. male   MRN: [id number]   DOB - 03/28/1956  Admit Date - 02/09/2018    Outpatient Primary MD for the patient is Revelo, [name], MD  Consult requested in the Hospital by Sainani, Vivek J, MD, On 02/09/2018    Reason for consult gangrene second toe left foot   With History of -  Past Medical History:  Diagnosis Date  . Diabetes (HCC)   . DVT (deep venous thrombosis) (HCC)       Past Surgical History:  Procedure Laterality Date  . groin surgery    . NECK SURGERY      in for   No chief complaint on file.    HPI  Austin [name]  is a 61 y.o. 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  Developed a lesion on the second toe left foot a couple weeks ago and we came back to the office today the digit was gangrenous and he had cellulitis on the top of the foot.  We had him direct admitted at that point.    Review of Systems    In addition to the HPI above,  No Fever-chills, No Headache, No changes with Vision or hearing, No problems swallowing food or Liquids, No Chest pain, Cough or Shortness of Breath, No Abdominal pain, No Nausea or Vommitting, Bowel movements are regular, No Blood in stool or Urine, No dysuria, No new skin rashes or bruises, No new joints pains-aches,  No new weakness, tingling, numbness in any extremity, No recent weight gain or loss, No polyuria, polydypsia or polyphagia, No significant Mental Stressors.  A full 10 point Review of Systems was done, except as stated above, all other Review of Systems were negative.   Social History Social History   Tobacco Use  . Smoking status: Heavy Tobacco Smoker    Packs/day: 1.00    Years: 30.00    Pack years: 30.00     Types: Cigarettes  . Smokeless tobacco: Never Used  Substance Use Topics  . Alcohol use: No    Family History Family History  Problem Relation Age of Onset  . Diabetes Mother   . Coronary artery disease Father   . Hypertension Sister   . Leukemia Brother     Prior to Admission medications   Medication Sig Start Date End Date Taking? Authorizing Provider  ACCU-CHEK AVIVA PLUS test strip  03/14/14   [provider]  atorvastatin (LIPITOR) 80 MG tablet  04/13/14   [provider]  Cholecalciferol (VITAMIN D3 PO) Take by mouth daily.    [provider]  clopidogrel (PLAVIX) 75 MG tablet Take by mouth.    [provider]  lisinopril (PRINIVIL,ZESTRIL) 10 MG tablet Take by mouth.    [provider]  metFORMIN (GLUCOPHAGE) 500 MG tablet Take by mouth.    [provider]  naproxen (NAPROSYN) 500 MG tablet Take 1 tablet (500 mg total) by mouth 2 (two) times daily with a meal. 10/17/17   Roemer, William P, PA-C    Anti-infectives (From admission, onward)   Start       Dose/Rate Route Frequency Ordered Stop   02/09/18 1700  piperacillin-tazobactam (ZOSYN) IVPB 3.375 g     3.375 g 12.5 mL/hr over 240 Minutes Intravenous Every 8 hours 02/09/18 1628     02/09/18 1630  vancomycin (VANCOCIN) IVPB 1000 mg/200 mL premix     1,000 mg 200 mL/hr over 60 Minutes Intravenous  Once 02/09/18 1628        Scheduled Meds: . atorvastatin  80 mg Oral q1800  . clopidogrel  75 mg Oral Daily  . enoxaparin (LOVENOX) injection  40 mg Subcutaneous Q24H  . insulin aspart  0-5 Units Subcutaneous QHS  . [START ON 02/10/2018] insulin aspart  0-9 Units Subcutaneous TID WC  . lisinopril  10 mg Oral Daily  . nicotine  21 mg Transdermal Daily   Continuous Infusions: . piperacillin-tazobactam (ZOSYN)  IV    . vancomycin     PRN Meds:.acetaminophen **OR** acetaminophen, ondansetron **OR** ondansetron (ZOFRAN) IV  No Known Allergies  Physical  Exam  Vitals  Blood pressure (!) 132/92, pulse 70, temperature 97.7 F (36.5 C), temperature source Oral, resp. rate 16, SpO2 99 %.  Lower Extremity exam:  Vascular: DP pulses are questionable to the left foot.  Feel like he needs a vascular consult at some point but we need to go and get the toe off in order to clear up the infection.  Dermatological: Gangrene to the second toe of the left foot.  No blistering or tracking is apparent into the deep portions of the foot.  Seems to be isolated to the digit but some mild cellulitis is started on the top of the foot.  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  Data Review  CBC Recent Labs  Lab 02/09/18 1634  WBC 6.1  HGB 14.6  HCT 41.5  PLT 157  MCV 87.4  MCH 30.7  MCHC 35.2  RDW 13.0   ------------------------------------------------------------------------------------------------------------------  Chemistries  Recent Labs  Lab 02/09/18 1634  NA 135  K 3.8  CL 98*  CO2 29  GLUCOSE 415*  BUN 8  CREATININE 0.89  CALCIUM 9.0   ------------------------------------------------------------------------------------------------------------------ CrCl cannot be calculated (Unknown ideal weight.). ------------------------------------------------------------------------------------------------------------------ No results for input(s): TSH, T4TOTAL, T3FREE, THYROIDAB in the last 72 hours.  Invalid input(s): FREET3 Urinalysis    Component Value Date/Time   COLORURINE Yellow 11/12/2013 2001   APPEARANCEUR Clear 11/12/2013 2001   LABSPEC 1.024 11/12/2013 2001   PHURINE 5.0 11/12/2013 2001   GLUCOSEU >=500 11/12/2013 2001   HGBUR Negative 11/12/2013 2001   BILIRUBINUR Negative 11/12/2013 2001   KETONESUR Negative 11/12/2013 2001   PROTEINUR Negative 11/12/2013 2001   NITRITE Negative  11/12/2013 2001   LEUKOCYTESUR Negative 11/12/2013 2001     Imaging results:   No results found.  Assessment & Plan: Glucose is significantly high which needs to be managed while he is in the hospital.  I spoke with my partner Dr. Todd cline and he will take over his care was in the hospital.  He is going to try to do surgery tomorrow to go and amputate that digit.  This will expedite his healing and resolution of infection.  Also right consult for vascular to take a look to see if they need to do anything invasive while he is still in the hospital.  Active Problems:   Gangrene of left foot (HCC)   Family Communication: Plan discussed with patient   and Dr. Cline  Cher Franzoni M.D on 02/09/2018 at 5:31 PM  Thank you for the consult, we will follow the patient with you in the Hospital.   

## 2018-02-09 NOTE — H&P (View-Only) (Signed)
[facility]                                                      Patient Demographics  Austin [name], is a 62 y.o. male   MRN: [id number]   DOB - 01-22-1956  Admit Date - 02/09/2018    Outpatient Primary MD for the patient is Revelo, Presley Raddle, MD  Consult requested in the Hospital by Houston Siren, MD, On 02/09/2018    Reason for consult gangrene second toe left foot   With History of -  Past Medical History:  Diagnosis Date  . Diabetes (HCC)   . DVT (deep venous thrombosis) (HCC)       Past Surgical History:  Procedure Laterality Date  . groin surgery    . NECK SURGERY      in for   No chief complaint on file.    HPI  Austin [name]  is a 62 y.o. 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  Developed a lesion on the second toe left foot a couple weeks ago and we came back to the office today the digit was gangrenous and he had cellulitis on the top of the foot.  We had him direct admitted at that point.    Review of Systems    In addition to the HPI above,  No Fever-chills, No Headache, No changes with Vision or hearing, No problems swallowing food or Liquids, No Chest pain, Cough or Shortness of Breath, No Abdominal pain, No Nausea or Vommitting, Bowel movements are regular, No Blood in stool or Urine, No dysuria, No new skin rashes or bruises, No new joints pains-aches,  No new weakness, tingling, numbness in any extremity, No recent weight gain or loss, No polyuria, polydypsia or polyphagia, No significant Mental Stressors.  A full 10 point Review of Systems was done, except as stated above, all other Review of Systems were negative.   Social History Social History   Tobacco Use  . Smoking status: Heavy Tobacco Smoker    Packs/day: 1.00    Years: 30.00    Pack years: 30.00     Types: Cigarettes  . Smokeless tobacco: Never Used  Substance Use Topics  . Alcohol use: No    Family History Family History  Problem Relation Age of Onset  . Diabetes Mother   . Coronary artery disease Father   . Hypertension Sister   . Leukemia Brother     Prior to Admission medications   Medication Sig Start Date End Date Taking? Authorizing Provider  ACCU-CHEK AVIVA PLUS test strip  03/14/14   [provider]  atorvastatin (LIPITOR) 80 MG tablet  04/13/14   [provider]  Cholecalciferol (VITAMIN D3 PO) Take by mouth daily.    [provider]  clopidogrel (PLAVIX) 75 MG tablet Take by mouth.    [provider]  lisinopril (PRINIVIL,ZESTRIL) 10 MG tablet Take by mouth.    [provider]  metFORMIN (GLUCOPHAGE) 500 MG tablet Take by mouth.    [provider]  naproxen (NAPROSYN) 500 MG tablet Take 1 tablet (500 mg total) by mouth 2 (two) times daily with a meal. 10/17/17   Lutricia Feil, PA-C    Anti-infectives (From admission, onward)   Start  Dose/Rate Route Frequency Ordered Stop   02/09/18 1700  piperacillin-tazobactam (ZOSYN) IVPB 3.375 g     3.375 g 12.5 mL/hr over 240 Minutes Intravenous Every 8 hours 02/09/18 1628     02/09/18 1630  vancomycin (VANCOCIN) IVPB 1000 mg/200 mL premix     1,000 mg 200 mL/hr over 60 Minutes Intravenous  Once 02/09/18 1628        Scheduled Meds: . atorvastatin  80 mg Oral q1800  . clopidogrel  75 mg Oral Daily  . enoxaparin (LOVENOX) injection  40 mg Subcutaneous Q24H  . insulin aspart  0-5 Units Subcutaneous QHS  . [START ON 02/10/2018] insulin aspart  0-9 Units Subcutaneous TID WC  . lisinopril  10 mg Oral Daily  . nicotine  21 mg Transdermal Daily   Continuous Infusions: . piperacillin-tazobactam (ZOSYN)  IV    . vancomycin     PRN Meds:.acetaminophen **OR** acetaminophen, ondansetron **OR** ondansetron (ZOFRAN) IV  No Known Allergies  Physical  Exam  Vitals  Blood pressure (!) 132/92, pulse 70, temperature 97.7 F (36.5 C), temperature source Oral, resp. rate 16, SpO2 99 %.  Lower Extremity exam:  Vascular: DP pulses are questionable to the left foot.  Feel like he needs a vascular consult at some point but we need to go and get the toe off in order to clear up the infection.  Dermatological: Gangrene to the second toe of the left foot.  No blistering or tracking is apparent into the deep portions of the foot.  Seems to be isolated to the digit but some mild cellulitis is started on the top of the foot.  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  Data Review  CBC Recent Labs  Lab 02/09/18 1634  WBC 6.1  HGB 14.6  HCT 41.5  PLT 157  MCV 87.4  MCH 30.7  MCHC 35.2  RDW 13.0   ------------------------------------------------------------------------------------------------------------------  Chemistries  Recent Labs  Lab 02/09/18 1634  NA 135  K 3.8  CL 98*  CO2 29  GLUCOSE 415*  BUN 8  CREATININE 0.89  CALCIUM 9.0   ------------------------------------------------------------------------------------------------------------------ CrCl cannot be calculated (Unknown ideal weight.). ------------------------------------------------------------------------------------------------------------------ No results for input(s): TSH, T4TOTAL, T3FREE, THYROIDAB in the last 72 hours.  Invalid input(s): FREET3 Urinalysis    Component Value Date/Time   COLORURINE Yellow 11/12/2013 2001   APPEARANCEUR Clear 11/12/2013 2001   LABSPEC 1.024 11/12/2013 2001   PHURINE 5.0 11/12/2013 2001   GLUCOSEU >=500 11/12/2013 2001   HGBUR Negative 11/12/2013 2001   BILIRUBINUR Negative 11/12/2013 2001   KETONESUR Negative 11/12/2013 2001   PROTEINUR Negative 11/12/2013 2001   NITRITE Negative  11/12/2013 2001   LEUKOCYTESUR Negative 11/12/2013 2001     Imaging results:   No results found.  Assessment & Plan: Glucose is significantly high which needs to be managed while he is in the hospital.  I spoke with my partner Dr. Tawanna Cooler cline and he will take over his care was in the hospital.  He is going to try to do surgery tomorrow to go and amputate that digit.  This will expedite his healing and resolution of infection.  Also right consult for vascular to take a look to see if they need to do anything invasive while he is still in the hospital.  Active Problems:   Gangrene of left foot [facility])   Family Communication: Plan discussed with patient  and Dr. Arline Asp M.D on 02/09/2018 at 5:31 PM  Thank you for the consult, we will follow the patient with you in the Hospital.

## 2018-02-09 NOTE — Progress Notes (Signed)
Pharmacy Antibiotic Note  Austin [name] is a 62 y.o. male admitted on 02/09/2018 with gangrene of left second.  Pharmacy has been consulted for vancomycin and Zosyn dosing.  Plan: Zosyn 3.375 g EI q 8 hours.   Vancomycin 1000 mg followed 1500 mg iv q 12 hours with stacked dosing and a trough with the 4th dose.   Height:  (193 cm) Weight: 183 lb 9.6 oz (83.3 kg) IBW/kg (Calculated) : 86.8  Temp (24hrs), Avg:97.7 F (36.5 C), Min:97.7 F (36.5 C), Max:97.7 F (36.5 C)  Recent Labs  Lab 02/09/18 1634  WBC 6.1  CREATININE 0.89    Estimated Creatinine Clearance: 101.4 mL/min (by C-G formula based on SCr of 0.89 mg/dL).    No Known Allergies  Antimicrobials this admission: vancomyicn 5/6 >>  Zosyn 5/6 >>   Dose adjustments this admission:   Microbiology results:  Thank you for allowing pharmacy to be a part of this patient's care.  Luisa Hart D 02/09/2018 8:02 PM

## 2018-02-10 ENCOUNTER — Encounter: Payer: Self-pay | Admitting: Anesthesiology

## 2018-02-10 ENCOUNTER — Inpatient Hospital Stay: Payer: Medicare Other | Admitting: Certified Registered Nurse Anesthetist

## 2018-02-10 ENCOUNTER — Encounter
Admission: AD | Disposition: A | Discharge status: Home / Self Care | Payer: Self-pay | Source: Ambulatory Visit | Attending: Internal Medicine

## 2018-02-10 HISTORY — PX: AMPUTATION TOE: SHX6595

## 2018-02-10 LAB — GLUCOSE, CAPILLARY
GLUCOSE-CAPILLARY: 188 mg/dL — AB (ref 65–99)
GLUCOSE-CAPILLARY: 270 mg/dL — AB (ref 65–99)
Glucose-Capillary: 226 mg/dL — ABNORMAL HIGH (ref 65–99)
Glucose-Capillary: 171 mg/dL — ABNORMAL HIGH (ref 65–99)
Glucose-Capillary: 201 mg/dL — ABNORMAL HIGH (ref 65–99)
Glucose-Capillary: 169 mg/dL — ABNORMAL HIGH (ref 65–99)
Glucose-Capillary: 264 mg/dL — ABNORMAL HIGH (ref 65–99)

## 2018-02-10 LAB — HIV ANTIBODY (ROUTINE TESTING W REFLEX): HIV Screen 4th Generation wRfx: NONREACTIVE

## 2018-02-10 SURGERY — AMPUTATION, TOE
Anesthesia: General | Laterality: Left | Wound class: "Dirty or Infected "

## 2018-02-10 MED ORDER — EPHEDRINE SULFATE 50 MG/ML IJ SOLN
INTRAMUSCULAR | Status: DC | PRN
  Administered 2018-02-10: 10 mg via INTRAVENOUS

## 2018-02-10 MED ORDER — LIDOCAINE HCL (CARDIAC) PF 100 MG/5ML IV SOSY
PREFILLED_SYRINGE | INTRAVENOUS | Status: DC | PRN
  Administered 2018-02-10: 80 mg via INTRAVENOUS

## 2018-02-10 MED ORDER — BUPIVACAINE HCL 0.5 % IJ SOLN
INTRAMUSCULAR | Status: DC | PRN
  Administered 2018-02-10: 10 mL

## 2018-02-10 MED ORDER — OXYCODONE HCL 5 MG PO TABS
5.0000 mg | ORAL_TABLET | ORAL | Status: DC | PRN
  Administered 2018-02-10 – 2018-02-11 (×5): 5 mg via ORAL
  Filled 2018-02-10 (×5): qty 1

## 2018-02-10 MED ORDER — INSULIN GLARGINE 100 UNIT/ML ~~LOC~~ SOLN
10.0000 [IU] | Freq: Every day | SUBCUTANEOUS | Status: DC
  Administered 2018-02-10 – 2018-02-11 (×2): 10 [IU] via SUBCUTANEOUS
  Filled 2018-02-10 (×2): qty 0.1

## 2018-02-10 MED ORDER — NEOMYCIN-POLYMYXIN B GU 40-200000 IR SOLN
Status: AC
  Filled 2018-02-10: qty 2

## 2018-02-10 MED ORDER — MIDAZOLAM HCL 2 MG/2ML IJ SOLN
INTRAMUSCULAR | Status: DC | PRN
  Administered 2018-02-10 (×2): 1 mg via INTRAVENOUS

## 2018-02-10 MED ORDER — LACTATED RINGERS IV SOLN
INTRAVENOUS | Status: DC | PRN
  Administered 2018-02-10: 12:00:00 via INTRAVENOUS

## 2018-02-10 MED ORDER — PROPOFOL 10 MG/ML IV BOLUS
INTRAVENOUS | Status: AC
  Filled 2018-02-10: qty 40

## 2018-02-10 MED ORDER — CLOPIDOGREL BISULFATE 75 MG PO TABS
75.0000 mg | ORAL_TABLET | Freq: Every day | ORAL | Status: DC
Start: 2018-02-11 — End: 2018-02-12
  Administered 2018-02-11: 75 mg via ORAL
  Filled 2018-02-10: qty 1

## 2018-02-10 MED ORDER — MORPHINE SULFATE (PF) 2 MG/ML IV SOLN
2.0000 mg | INTRAVENOUS | Status: DC | PRN
  Administered 2018-02-10 – 2018-02-11 (×3): 2 mg via INTRAVENOUS
  Filled 2018-02-10 (×3): qty 1

## 2018-02-10 MED ORDER — FENTANYL CITRATE (PF) 100 MCG/2ML IJ SOLN
INTRAMUSCULAR | Status: AC
  Filled 2018-02-10: qty 2

## 2018-02-10 MED ORDER — PROPOFOL 10 MG/ML IV BOLUS
INTRAVENOUS | Status: DC | PRN
  Administered 2018-02-10: 100 mg via INTRAVENOUS

## 2018-02-10 MED ORDER — FENTANYL CITRATE (PF) 100 MCG/2ML IJ SOLN
INTRAMUSCULAR | Status: DC | PRN
  Administered 2018-02-10 (×2): 50 ug via INTRAVENOUS

## 2018-02-10 MED ORDER — ONDANSETRON HCL 4 MG/2ML IJ SOLN: 4.0000 mg | Freq: Once | INTRAMUSCULAR | Status: DC | PRN

## 2018-02-10 MED ORDER — MIDAZOLAM HCL 2 MG/2ML IJ SOLN
INTRAMUSCULAR | Status: AC
  Filled 2018-02-10: qty 2

## 2018-02-10 MED ORDER — NEOMYCIN-POLYMYXIN B GU 40-200000 IR SOLN
Status: DC | PRN
  Administered 2018-02-10: 2 mL

## 2018-02-10 MED ORDER — PROPOFOL 500 MG/50ML IV EMUL
INTRAVENOUS | Status: DC | PRN
  Administered 2018-02-10: 100 ug/kg/min via INTRAVENOUS

## 2018-02-10 MED ORDER — FENTANYL CITRATE (PF) 100 MCG/2ML IJ SOLN: 25.0000 ug | INTRAMUSCULAR | Status: DC | PRN

## 2018-02-10 MED ORDER — DOCUSATE SODIUM 100 MG PO CAPS
100.0000 mg | ORAL_CAPSULE | Freq: Two times a day (BID) | ORAL | Status: DC
  Administered 2018-02-10 – 2018-02-11 (×3): 100 mg via ORAL
  Filled 2018-02-10 (×3): qty 1

## 2018-02-10 MED ORDER — BUPIVACAINE HCL (PF) 0.5 % IJ SOLN
INTRAMUSCULAR | Status: AC
  Filled 2018-02-10: qty 30

## 2018-02-10 SURGICAL SUPPLY — 46 items
BANDAGE ACE 4X5 VEL STRL LF (GAUZE/BANDAGES/DRESSINGS) ×3 IMPLANT
BLADE MED AGGRESSIVE (BLADE) ×3 IMPLANT
BLADE OSC/SAGITTAL MD 5.5X18 (BLADE) ×3 IMPLANT
BLADE SURG 15 STRL LF DISP TIS (BLADE) ×2 IMPLANT
BLADE SURG 15 STRL SS (BLADE)
BLADE SURG MINI STRL (BLADE) ×3 IMPLANT
BNDG ESMARK 4X12 TAN STRL LF (GAUZE/BANDAGES/DRESSINGS) ×3 IMPLANT
BNDG GAUZE 4.5X4.1 6PLY STRL (MISCELLANEOUS) ×3 IMPLANT
CANISTER SUCT 1200ML W/VALVE (MISCELLANEOUS) ×3 IMPLANT
CLOSURE WOUND 1/4X4 (GAUZE/BANDAGES/DRESSINGS) ×1
CUFF TOURN 18 STER (MISCELLANEOUS) ×1 IMPLANT
CUFF TOURN DUAL PL 12 NO SLV (MISCELLANEOUS) ×3 IMPLANT
DRAPE FLUOR MINI C-ARM 54X84 (DRAPES) ×1 IMPLANT
DURAPREP 26ML APPLICATOR (WOUND CARE) ×3 IMPLANT
ELECT REM PT RETURN 9FT ADLT (ELECTROSURGICAL) ×3
ELECTRODE REM PT RTRN 9FT ADLT (ELECTROSURGICAL) ×1 IMPLANT
GAUZE PETRO XEROFOAM 1X8 (MISCELLANEOUS) ×3 IMPLANT
GAUZE SPONGE 4X4 12PLY STRL (GAUZE/BANDAGES/DRESSINGS) ×3 IMPLANT
GAUZE STRETCH 2X75IN STRL (MISCELLANEOUS) ×3 IMPLANT
GLOVE BIO SURGEON STRL SZ7.5 (GLOVE) ×3 IMPLANT
GLOVE INDICATOR 8.0 STRL GRN (GLOVE) ×3 IMPLANT
GOWN STRL REUS W/ TWL LRG LVL3 (GOWN DISPOSABLE) ×2 IMPLANT
GOWN STRL REUS W/TWL LRG LVL3 (GOWN DISPOSABLE) ×4
HANDPIECE VERSAJET DEBRIDEMENT (MISCELLANEOUS) ×3 IMPLANT
KIT TURNOVER KIT A (KITS) ×3 IMPLANT
LABEL OR SOLS (LABEL) ×3 IMPLANT
NDL FILTER BLUNT 18X1 1/2 (NEEDLE) ×1 IMPLANT
NDL HYPO 25X1 1.5 SAFETY (NEEDLE) ×2 IMPLANT
NEEDLE FILTER BLUNT 18X 1/2SAF (NEEDLE) ×2
NEEDLE FILTER BLUNT 18X1 1/2 (NEEDLE) ×1 IMPLANT
NEEDLE HYPO 25X1 1.5 SAFETY (NEEDLE) ×6 IMPLANT
NS IRRIG 500ML POUR BTL (IV SOLUTION) ×3 IMPLANT
PACK EXTREMITY ARMC (MISCELLANEOUS) ×3 IMPLANT
SOL .9 NS 3000ML IRR  AL (IV SOLUTION)
SOL .9 NS 3000ML IRR UROMATIC (IV SOLUTION) ×1 IMPLANT
SOL PREP PVP 2OZ (MISCELLANEOUS) ×3
SOLUTION PREP PVP 2OZ (MISCELLANEOUS) ×1 IMPLANT
STOCKINETTE STRL 6IN 960660 (GAUZE/BANDAGES/DRESSINGS) ×3 IMPLANT
STRIP CLOSURE SKIN 1/4X4 (GAUZE/BANDAGES/DRESSINGS) ×2 IMPLANT
SUT ETHILON 3-0 FS-10 30 BLK (SUTURE) ×3
SUT ETHILON 4-0 (SUTURE)
SUT ETHILON 4-0 FS2 18XMFL BLK (SUTURE)
SUTURE EHLN 3-0 FS-10 30 BLK (SUTURE) ×1 IMPLANT
SUTURE ETHLN 4-0 FS2 18XMF BLK (SUTURE) IMPLANT
SWAB DUAL CULTURE TRANS RED ST (MISCELLANEOUS) ×3 IMPLANT
SYR 10ML LL (SYRINGE) ×3 IMPLANT

## 2018-02-10 NOTE — Progress Notes (Signed)
Chaplain responded to an OR for an AD. Pt said he trusted his son (next of kin) and sister to make the decisions for him. Chaplain educated Pt on AD and offered prayer. Pt wanted to have prayer for his foot. Chaplain prayed for healing and for staff.    02/10/18 0900  Clinical Encounter Type  Visited With Patient  Visit Type Initial  Referral From Nurse  Spiritual Encounters  Spiritual Needs Brochure;Prayer

## 2018-02-10 NOTE — Anesthesia Preprocedure Evaluation (Signed)
Anesthesia Evaluation  Patient identified by MRN, date of birth, ID band Patient awake    Reviewed: Allergy & Precautions, NPO status , Patient's Chart, lab work & pertinent test results  Airway Mallampati: II  TM Distance: >3 FB     Dental   Pulmonary Current Smoker,    Pulmonary exam normal        Cardiovascular + Peripheral Vascular Disease  Normal cardiovascular exam     Neuro/Psych negative neurological ROS  negative psych ROS   GI/Hepatic   Endo/Other  diabetes, Poorly Controlled, Type 2  Renal/GU      Musculoskeletal   Abdominal Normal abdominal exam  (+)   Peds negative pediatric ROS (+)  Hematology negative hematology ROS (+)   Anesthesia Other Findings Past Medical History: No date: Diabetes (HCC) No date: DVT (deep venous thrombosis) (HCC)  Reproductive/Obstetrics                             Anesthesia Physical Anesthesia Plan  ASA: III  Anesthesia Plan: General   Post-op Pain Management:    Induction:   PONV Risk Score and Plan:   Airway Management Planned:   Additional Equipment:   Intra-op Plan:   Post-operative Plan:   Informed Consent:   Plan Discussed with:   Anesthesia Plan Comments:         Anesthesia Quick Evaluation

## 2018-02-10 NOTE — Interval H&P Note (Signed)
History and Physical Interval Note:  02/10/2018 12:19 PM  Austin [name]  has presented today for surgery, with the diagnosis of n/a  The various methods of treatment have been discussed with the patient and family. After consideration of risks, benefits and other options for treatment, the patient has consented to  Procedure(s): AMPUTATION TOE (Left) as a surgical intervention .  [name] history has been reviewed, patient examined, no change in status, stable for surgery.  I have reviewed the patient's chart and labs.  Questions were answered to the patient's satisfaction.     Ricci Barker

## 2018-02-10 NOTE — Transfer of Care (Signed)
Immediate Anesthesia Transfer of Care Note  Patient: Austin [name]  Procedure(s) Performed: AMPUTATION TOE (Left )  Patient Location: PACU  Anesthesia Type:General  Level of Consciousness: drowsy  Airway & Oxygen Therapy: Patient Spontanous Breathing  Post-op Assessment: Report given to RN  Post vital signs: stable  Last Vitals:  Vitals Value Taken Time  BP 117/70 02/10/2018  1:33 PM  Temp 36.1 C 02/10/2018  1:33 PM  Pulse 86 02/10/2018  1:33 PM  Resp 23 02/10/2018  1:33 PM  SpO2 100 % 02/10/2018  1:33 PM  Vitals shown include unvalidated device data.  Last Pain:  Vitals:   02/10/18 1159  TempSrc: Oral  PainSc: 0-No pain      Patients Stated Pain Goal: 4 (02/09/18 1900)  Complications: No apparent anesthesia complications

## 2018-02-10 NOTE — Progress Notes (Signed)
[facility] [place] [facility]   PATIENT NAME: Austin [name]    MR#:  [id number]  DATE OF BIRTH:  [birth year]  SUBJECTIVE:  CHIEF COMPLAINT: Patient is n.p.o. and currently waiting for surgery.  Reports pain is 10 out of 10 in his foot  REVIEW OF SYSTEMS:  CONSTITUTIONAL: No fever, fatigue or weakness.  EYES: No blurred or double vision.  EARS, NOSE, AND THROAT: No tinnitus or ear pain.  RESPIRATORY: No cough, shortness of breath, wheezing or hemoptysis.  CARDIOVASCULAR: No chest pain, orthopnea, edema.  GASTROINTESTINAL: No nausea, vomiting, diarrhea or abdominal pain.  GENITOURINARY: No dysuria, hematuria.  ENDOCRINE: No polyuria, nocturia,  HEMATOLOGY: No anemia, easy bruising or bleeding SKIN: No rash or lesion. MUSCULOSKELETAL: Left foot with erythema, second toe gangrene NEUROLOGIC: No tingling, numbness, weakness.  PSYCHIATRY: No anxiety or depression.   DRUG ALLERGIES:  No Known Allergies  VITALS:  Blood pressure (!) 92/49, pulse 69, temperature 97.8 F (36.6 C), temperature source Oral, resp. rate 17, height  (1.93 m), weight 83 kg (183 lb), SpO2 98 %.  PHYSICAL EXAMINATION:  GENERAL:  62 y.o.-year-old patient lying in the bed with no acute distress.  EYES: Pupils equal, round, reactive to light and accommodation. No scleral icterus. Extraocular muscles intact.  HEENT: Head atraumatic, normocephalic. Oropharynx and nasopharynx clear.  NECK:  Supple, no jugular venous distention. No thyroid enlargement, no tenderness.  LUNGS: Normal breath sounds bilaterally, no wheezing, rales,rhonchi or crepitation. No use of accessory muscles of respiration.  CARDIOVASCULAR: S1, S2 normal. No murmurs, rubs, or gallops.  ABDOMEN: Soft, nontender, nondistended. Bowel sounds present. No organomegaly or mass.  EXTREMITIES: Left foot with erythema, second toe gangrene  No pedal edema, cyanosis, or clubbing.  NEUROLOGIC: Cranial nerves II through XII  are intact.  Sensation intact. Gait not checked.  PSYCHIATRIC: The patient is alert and oriented x 3.  SKIN: No obvious rash, lesion, or ulcer.    LABORATORY PANEL:   CBC Recent Labs  Lab 02/09/18 1634  WBC 6.1  HGB 14.6  HCT 41.5  PLT 157   ------------------------------------------------------------------------------------------------------------------  Chemistries  Recent Labs  Lab 02/09/18 1634  NA 135  K 3.8  CL 98*  CO2 29  GLUCOSE 415*  BUN 8  CREATININE 0.89  CALCIUM 9.0   ------------------------------------------------------------------------------------------------------------------  Cardiac Enzymes No results for input(s): TROPONINI in the last 168 hours. ------------------------------------------------------------------------------------------------------------------  RADIOLOGY:  No results found.  EKG:  No orders found for this or any previous visit.  ASSESSMENT AND PLAN:   62 year old male with past medical history of diabetes, hypertension, peripheral vascular disease, ongoing tobacco abuse to the hospital directly admitted from his podiatrist office due to left second toe gangrene.  1.  Left foot cellulitis/gangrene-  Scheduled for surgery today by podiatry Dr. Graciela Husbands.  Patient is currently n.p.o. Continue with broad-spectrum IV antibiotics with vancomycin, Zosyn. -  consult vascular surgery. - Follow clinically.  2.  Diabetes type 2 without complication- hold metformin, place and sliding scale insulin.  Follow blood sugars.  And check hemoglobin A1c  will start patient on Lantus 10 units once he is tolerating diet  3.  Hyperlipidemia-continue atorvastatin.  4.  History of peripheral vascular disease/PAD- continue Plavix, statin.  Await further vascular surgery input.  5.  Essential hypertension-continue lisinopril.  6. Tobacco abuse - will place pt. On Nicotine patch.        All the records are reviewed and case discussed  with Care Management/Social Workerr. Management plans  discussed with the patient, family and they are in agreement.  CODE STATUS: fc   TOTAL TIME TAKING CARE OF THIS PATIENT: 34  minutes.   POSSIBLE D/C IN 2-3 DAYS, DEPENDING ON CLINICAL CONDITION.  Note: This dictation was prepared with Dragon dictation along with smaller phrase technology. Any transcriptional errors that result from this process are unintentional.   Ramonita Lab M.D on 02/10/2018 at 1:05 PM  Between 7am to 6pm - Pager - [phone] After 6pm go to [email or web address] - password [email or web address] [facility]  [place]  Office  [phone]  CC: Primary care physician; Preston Fleeting, MD

## 2018-02-10 NOTE — Anesthesia Postprocedure Evaluation (Signed)
Anesthesia Post Note  Patient: Austin [name]  Procedure(s) Performed: AMPUTATION TOE (Left )  Patient location during evaluation: PACU Anesthesia Type: General Level of consciousness: awake and alert and oriented Pain management: pain level controlled Vital Signs Assessment: post-procedure vital signs reviewed and stable Cardiovascular status: blood pressure returned to baseline Anesthetic complications: no     Last Vitals:  Vitals:   02/10/18 1418 02/10/18 1500  BP: (!) 104/51 122/82  Pulse: 74 85  Resp: 10 16  Temp: (!) 36.3 C   SpO2: 100% 100%    Last Pain:  Vitals:   02/10/18 1500  TempSrc:   PainSc: 0-No pain                 Marisha Renier

## 2018-02-10 NOTE — Progress Notes (Signed)
Inpatient Diabetes Program Recommendations  AACE/ADA: New Consensus Statement on Inpatient Glycemic Control (2015)  Target Ranges:  Prepandial:   less than 140 mg/dL      Peak postprandial:   less than 180 mg/dL (1-2 hours)      Critically ill patients:  140 - 180 mg/dL   Results for Austin [name], Austin [name] (MRN [id number]) as of 02/10/2018 09:08  Ref. Range 02/09/2018 16:54 02/09/2018 21:05 02/10/2018 07:46  Glucose-Capillary Latest Ref Range: 65 - 99 mg/dL 409 (H) 811 (H)  3 units NOVOLOG  226 (H)  3 units NOVOLOG     Admit with: Left foot cellulitis/gangrene  History: DM  Home DM Meds: Metformin 500 mg daily  Current Insulin Orders: Novolog Sensitive Correction Scale/ SSI (0-9 units) TID AC + HS       MD- Please consider the following in-hospital insulin adjustments:  1. Start Lantus 12 units daily (0.15 units/kg dosing based on weight of 83 kg)  2. Order Hemoglobin A1c level.  Last one on file was from 2015.     --Will follow patient during hospitalization--  [name] RN, MSN, CDE Diabetes Coordinator Inpatient Glycemic Control Team Team Pager: [phone] (8a-5p)

## 2018-02-10 NOTE — Op Note (Addendum)
Date of operation 02/10/2018.  Surgeon: Durward Fortes D.P.M.  Preoperative diagnosis: Gangrene left second toe.  Postoperative diagnosis: Same.  Procedure: Amputation left second toe metatarsal phalangeal joint.  Anesthesia: Local MAC.  Hemostasis: None.  Estimated blood loss: Less than 5 cc.  Cultures: Deep wound cultures left forefoot.  Pathology: Left second toe.  Complications: None apparent.  Operative indications: This is a 63 year old male with recent development of gangrenous changes in his left second toe.  Decision was made for admission to the hospital with amputation of the second toe as well as vascular consult.  Operative procedure: Patient was taken to the operating room and placed on the table in the supine position.  Following satisfactory sedation the left forefoot was anesthetized with 10 cc of 0.5% bupivacaine plain around the second metatarsal.  The foot was then prepped and draped in the usual sterile fashion.      Attention was then directed to the distal aspect of the left foot where an elliptical incision was made coursing dorsal to plantar around the medial and lateral base of the second toe.  Incision was carried sharply down to the level of bone where dissection was carried back to the level of the metatarsal phalangeal joint and the toe was disarticulated and removed in toto.  A culture was taken of the deeper subcutaneous tissues with the toe with noted purulence.  The wound was then flushed with copious amounts of sterile saline with a bulb syringe and closed using 3-0 and 4-0 nylon simple interrupted sutures.  Xeroform 4 x 4's con form Kerlix and Ace wrap applied to the left lower extremity.  Patient tolerated procedure and anesthesia well and was transported to the PACU with vital signs stable and in good condition.

## 2018-02-10 NOTE — Anesthesia Post-op Follow-up Note (Signed)
Anesthesia QCDR form completed.        

## 2018-02-11 ENCOUNTER — Encounter: Payer: Self-pay | Admitting: Podiatry

## 2018-02-11 DIAGNOSIS — I96 Gangrene, not elsewhere classified: Secondary | ICD-10-CM

## 2018-02-11 DIAGNOSIS — E119 Type 2 diabetes mellitus without complications: Secondary | ICD-10-CM

## 2018-02-11 DIAGNOSIS — I10 Essential (primary) hypertension: Secondary | ICD-10-CM

## 2018-02-11 DIAGNOSIS — M79605 Pain in left leg: Secondary | ICD-10-CM

## 2018-02-11 LAB — CBC
HCT: 39.4 % — ABNORMAL LOW (ref 40.0–52.0)
HEMOGLOBIN: 14 g/dL (ref 13.0–18.0)
MCH: 30.6 pg (ref 26.0–34.0)
MCHC: 35.5 g/dL (ref 32.0–36.0)
MCV: 86.4 fL (ref 80.0–100.0)
Platelets: 170 10*3/uL (ref 150–440)
RBC: 4.56 MIL/uL (ref 4.40–5.90)
RDW: 12.8 % (ref 11.5–14.5)
WBC: 9.3 10*3/uL (ref 3.8–10.6)

## 2018-02-11 LAB — GLUCOSE, CAPILLARY
GLUCOSE-CAPILLARY: 235 mg/dL — AB (ref 65–99)
Glucose-Capillary: 277 mg/dL — ABNORMAL HIGH (ref 65–99)
Glucose-Capillary: 313 mg/dL — ABNORMAL HIGH (ref 65–99)
Glucose-Capillary: 234 mg/dL — ABNORMAL HIGH (ref 65–99)

## 2018-02-11 LAB — BASIC METABOLIC PANEL
ANION GAP: 7 (ref 5–15)
BUN: 10 mg/dL (ref 6–20)
CALCIUM: 8.4 mg/dL — AB (ref 8.9–10.3)
CO2: 26 mmol/L (ref 22–32)
Chloride: 103 mmol/L (ref 101–111)
Creatinine, Ser: 0.76 mg/dL (ref 0.61–1.24)
GFR calc non Af Amer: >60 mL/min (ref >60)
Glucose, Bld: 218 mg/dL — ABNORMAL HIGH (ref 65–99)
POTASSIUM: 3.9 mmol/L (ref 3.5–5.1)
Sodium: 136 mmol/L (ref 135–145)

## 2018-02-11 LAB — HEMOGLOBIN A1C
Hgb A1c MFr Bld: 10.2 % — ABNORMAL HIGH (ref 4.8–5.6)
MEAN PLASMA GLUCOSE: 246.04 mg/dL

## 2018-02-11 LAB — VANCOMYCIN, TROUGH: VANCOMYCIN TR: 11 ug/mL — AB (ref 15–20)

## 2018-02-11 MED ORDER — CEFAZOLIN SODIUM-DEXTROSE 2-4 GM/100ML-% IV SOLN
2.0000 g | INTRAVENOUS | Status: DC
  Filled 2018-02-11: qty 100

## 2018-02-11 MED ORDER — MAGNESIUM HYDROXIDE 400 MG/5ML PO SUSP
30.0000 mL | Freq: Every evening | ORAL | Status: DC | PRN
  Administered 2018-02-11: 30 mL via ORAL
  Filled 2018-02-11: qty 30

## 2018-02-11 MED ORDER — INSULIN GLARGINE 100 UNIT/ML ~~LOC~~ SOLN
15.0000 [IU] | Freq: Every day | SUBCUTANEOUS | Status: DC
  Administered 2018-02-12: 15 [IU] via SUBCUTANEOUS
  Filled 2018-02-11 (×2): qty 0.15

## 2018-02-11 MED ORDER — CEFAZOLIN SODIUM-DEXTROSE 2-4 GM/100ML-% IV SOLN
2.0000 g | INTRAVENOUS | Status: AC
  Administered 2018-02-12: 2 g via INTRAVENOUS
  Filled 2018-02-11: qty 100

## 2018-02-11 MED ORDER — INSULIN GLARGINE 100 UNIT/ML ~~LOC~~ SOLN
5.0000 [IU] | Freq: Once | SUBCUTANEOUS | Status: AC
  Administered 2018-02-11: 5 [IU] via SUBCUTANEOUS
  Filled 2018-02-11: qty 0.05

## 2018-02-11 MED ORDER — LIVING WELL WITH DIABETES BOOK
Freq: Once | Status: AC
  Administered 2018-02-11: 16:00:00
  Filled 2018-02-11: qty 1

## 2018-02-11 NOTE — Consult Note (Signed)
[facility] Vascular Consult Note  MRN : [id number]  Austin [name] is a 62 y.o. (April 25, 1956) male who presents with chief complaint of No chief complaint on file. Marland Kitchen  History of Present Illness: I am asked to see the patient by Dr. Alberteen Spindle and Dr. Amado Coe for gangrene of the left foot.  Patient was admitted to the hospital Monday and taken to the operating room where Dr. Alberteen Spindle performed a digital amputation.  Since that time, he still has pain in the left foot although it is improved.  He reports having had trouble with this foot for months now.  He has a long history of tobacco use and a long history of peripheral vascular disease having had multiple interventions in the past.  He says these were done many years ago at [facility].  It is not clear when the last time he had a vascular assessment was.  He was having some claudication symptoms in both legs prior to the issue with his toe.  He denies any current fever or chill but had some prior to admission.  No trauma or injury or causative event that started the symptom.  Current Facility-Administered Medications  Medication Dose Route Frequency Provider Last Rate Last Dose  . acetaminophen (TYLENOL) tablet 650 mg  650 mg Oral Q6H PRN Linus Galas, DPM   650 mg at 02/09/18 1859   Or  . acetaminophen (TYLENOL) suppository 650 mg  650 mg Rectal Q6H PRN Linus Galas, DPM      . atorvastatin (LIPITOR) tablet 80 mg  80 mg Oral q1800 Linus Galas, DPM   80 mg at 02/10/18 1609  . clopidogrel (PLAVIX) tablet 75 mg  75 mg Oral Daily Linus Galas, DPM   75 mg at 02/11/18 0843  . docusate sodium (COLACE) capsule 100 mg  100 mg Oral BID Linus Galas, DPM   100 mg at 02/11/18 0843  . enoxaparin (LOVENOX) injection 40 mg  40 mg Subcutaneous Q24H Linus Galas, DPM      . insulin aspart (novoLOG) injection 0-5 Units  0-5 Units Subcutaneous QHS Linus Galas, DPM   3 Units at 02/10/18 2315  . insulin aspart (novoLOG) injection 0-9 Units  0-9 Units  Subcutaneous TID WC Linus Galas, DPM   7 Units at 02/11/18 1241  . [START ON 02/12/2018] insulin glargine (LANTUS) injection 15 Units  15 Units Subcutaneous Daily Gouru, Aruna, MD      . insulin glargine (LANTUS) injection 5 Units  5 Units Subcutaneous Once Gouru, Aruna, MD      . lisinopril (PRINIVIL,ZESTRIL) tablet 10 mg  10 mg Oral Daily Linus Galas, DPM   10 mg at 02/11/18 0843  . living well with diabetes book MISC   Does not apply Once Gouru, Aruna, MD      . morphine 2 MG/ML injection 2 mg  2 mg Intravenous Q4H PRN Linus Galas, DPM   2 mg at 02/10/18 2316  . nicotine (NICODERM CQ - dosed in mg/24 hours) patch 21 mg  21 mg Transdermal Daily Linus Galas, DPM   21 mg at 02/11/18 0843  . ondansetron (ZOFRAN) tablet 4 mg  4 mg Oral Q6H PRN Linus Galas, DPM       Or  . ondansetron [facility]) injection 4 mg  4 mg Intravenous Q6H PRN Linus Galas, DPM      . oxyCODONE (Oxy IR/ROXICODONE) immediate release tablet 5 mg  5 mg Oral Q4H PRN Linus Galas, DPM   5 mg at 02/11/18  1240  . piperacillin-tazobactam (ZOSYN) IVPB 3.375 g  3.375 g Intravenous Q8H Linus Galas, DPM   Stopped at 02/11/18 1244  . vancomycin (VANCOCIN) 1,500 mg in sodium chloride 0.9 % 500 mL IVPB  1,500 mg Intravenous Q12H Linus Galas, DPM   Stopped at 02/11/18 1441    Past Medical History:  Diagnosis Date  . Diabetes (HCC)   . DVT (deep venous thrombosis) (HCC)     Past Surgical History:  Procedure Laterality Date  . AMPUTATION TOE Left 02/10/2018   Procedure: AMPUTATION TOE;  Surgeon: Linus Galas, DPM;  Location: [facility] ORS;  Service: Podiatry;  Laterality: Left;  . groin surgery    . NECK SURGERY    stents in the legs  Social History Social History   Tobacco Use  . Smoking status: Heavy Tobacco Smoker    Packs/day: 1.00    Years: 30.00    Pack years: 30.00    Types: Cigarettes  . Smokeless tobacco: Never Used  Substance Use Topics  . Alcohol use: No  . Drug use: No    Family History Family History  Problem  Relation Age of Onset  . Diabetes Mother   . Coronary artery disease Father   . Hypertension Sister   . Leukemia Brother     No Known Allergies   REVIEW OF SYSTEMS (Negative unless checked)  Constitutional: Weight loss  Fever  Chills Cardiac: Chest pain   Chest pressure   Palpitations   Shortness of breath when laying flat   Shortness of breath at rest   Shortness of breath with exertion. Vascular:  Pain in legs with walking   Pain in legs at rest   Pain in legs when laying flat   Claudication   Pain in feet when walking  Pain in feet at rest  Pain in feet when laying flat   History of DVT   Phlebitis   Swelling in legs   Varicose veins   Non-healing ulcers Pulmonary:   Uses home oxygen   Productive cough   Hemoptysis   Wheeze  COPD   Asthma Neurologic:  Dizziness  Blackouts   Seizures   History of stroke   History of TIA  Aphasia   Temporary blindness   Dysphagia   Weakness or numbness in arms   Weakness or numbness in legs Musculoskeletal:  Arthritis   Joint swelling   Joint pain   Low back pain Hematologic:  Easy bruising  Easy bleeding   Hypercoagulable state   Anemic  Hepatitis Gastrointestinal:  Blood in stool   Vomiting blood  Gastroesophageal reflux/heartburn   Difficulty swallowing. Genitourinary:  Chronic kidney disease   Difficult urination  Frequent urination  Burning with urination   Blood in urine Skin:  Rashes   Ulcers   Wounds Psychological:  History of anxiety    History of major depression.  Physical Examination  Vitals:   02/10/18 2029 02/11/18 0007 02/11/18 0444 02/11/18 0756  BP: 111/69 (!) 159/78 130/74 127/81  Pulse: 66 76 83 81  Resp: Temp: 97.9 F (36.6 C) 98 F (36.7 C) 98 F (36.7 C) 98 F (36.7 C)  TempSrc: Oral Oral Oral Oral  SpO2: 96% 98% 99% 96%  Weight:      Height:       Body mass index is 22.28  kg/m. Gen:  WD/WN, NAD Head: [place]/AT, No temporalis wasting.  Ear/Nose/Throat: Hearing grossly intact, nares w/o erythema or drainage, oropharynx w/o Erythema/Exudate Eyes: Sclera non-icteric,  conjunctiva clear Neck: Trachea midline.  No JVD.  Pulmonary:  Good air movement, respirations not labored, equal bilaterally.  Cardiac: RRR, normal S1, S2. Vascular:  Vessel Right Left  Radial Palpable Palpable                          PT 1+ Palpable 1+ Palpable  DP 1+ Palpable Not Palpable   Musculoskeletal: M/S 5/5 throughout. Left foot currently dressed after digital amputation. No edema. Neurologic: Sensation grossly intact in extremities.  Symmetrical.  Speech is fluent. Motor exam as listed above. Psychiatric: Judgment intact, Mood & affect appropriate for pt's clinical situation. Dermatologic: left foot dressed      CBC Lab Results  Component Value Date   WBC 9.3 02/11/2018   HGB 14.0 02/11/2018   HCT 39.4 (L) 02/11/2018   MCV 86.4 02/11/2018   PLT 170 02/11/2018    BMET    Component Value Date/Time   NA 136 02/11/2018 0424   NA 135 (L) 11/17/2013 0621   K 3.9 02/11/2018 0424   K 4.3 11/17/2013 0621   CL 103 02/11/2018 0424   CL 99 11/17/2013 0621   CO2 26 02/11/2018 0424   CO2 31 11/17/2013 0621   GLUCOSE 218 (H) 02/11/2018 0424   GLUCOSE 172 (H) 11/17/2013 0621   BUN 10 02/11/2018 0424   BUN 10 11/17/2013 0621   CREATININE 0.76 02/11/2018 0424   CREATININE 0.85 11/17/2013 0621   CALCIUM 8.4 (L) 02/11/2018 0424   CALCIUM 9.6 11/17/2013 0621   GFRNONAA >60 02/11/2018 0424   GFRNONAA >60 11/17/2013 0621   GFRAA >60 02/11/2018 0424   GFRAA >60 11/17/2013 0621   Estimated Creatinine Clearance: 112.4 mL/min (by C-G formula based on SCr of 0.76 mg/dL).  COAG No results found for: INR, PROTIME  Radiology No results found.    Assessment/Plan 1.  Peripheral arterial disease with gangrene of the left foot.  Patient is already gone through digital  amputation and tolerated this reasonably well.  The foot is still painful.  He has a long history of atherosclerotic occlusive disease.  Given his PAD, angiogram should be performed for further evaluation and potential treatment.  He reports having had 2 stents placed at [facility] many years ago.  I am not sure when the most recent one was done.  Risks and benefits of angiography were discussed with he and his family and they are agreeable to proceed. 2.  Diabetes.  Suboptimal control on outpatient medications and blood glucose control important in reducing the progression of atherosclerotic disease. Also, involved in wound healing. On appropriate medications. 3.  HTN.  Stable on outpatient medications and blood pressure control important in reducing the progression of atherosclerotic disease. On appropriate oral medications. 4.  Hyperlipidemia.  Continue statin therapy   Festus Barren, MD  02/11/2018 3:41 PM    This note was created with Dragon medical transcription system.  Any error is purely unintentional

## 2018-02-11 NOTE — Progress Notes (Addendum)
Inpatient Diabetes Program Recommendations  AACE/ADA: New Consensus Statement on Inpatient Glycemic Control (2015)  Target Ranges:  Prepandial:   less than 140 mg/dL      Peak postprandial:   less than 180 mg/dL (1-2 hours)      Critically ill patients:  140 - 180 mg/dL   Results for Austin, [name] (MRN [id number]) as of 02/11/2018 08:55  Ref. Range 02/10/2018 07:46 02/10/2018 11:35 02/10/2018 12:04 02/10/2018 14:10 02/10/2018 16:34 02/10/2018 21:58 02/10/2018 23:03 02/11/2018 08:18  Glucose-Capillary Latest Ref Range: 65 - 99 mg/dL 469 (H) 629 (H) 528 (H) 169 (H) 188 (H) 270 (H) 264 (H) 277 (H)   Review of Glycemic ControlResults for Austin, [name] (MRN [id number]) as of 02/11/2018 08:55  Ref. Range 02/11/2018 04:24  Hemoglobin A1C Latest Ref Range: 4.8 - 5.6 % 10.2 (H)    Diabetes history: DM2 Outpatient Diabetes medications: Metformin 1000 mg BID Current orders for Inpatient glycemic control: Lantus 10 units daily, Novolog 0-9 units TID with meals, Novolog 0-5 units QHS  Inpatient Diabetes Program Recommendations: Insulin-Basal: Please consider increasing Lantus to 15 units daily. If MD increases Lantus as recommended, please order a one time dose of Lantus 5 units today (to give now) since patient received Lantus 10 units already today. HgbA1C: A1C 10.2% on 02/11/18 indicating an average glucose of 246 mg/dl over the past 2-3 months. Patient will need to be discharged on additional DM medication(s) and instructed to follow up with PCP regarding DM control.  Will plan to talk with patient today.  Addendum 02/11/18@13 :00-Spoke with patient about diabetes and home regimen for diabetes control. Patient reports that he is followed by PCP for diabetes management and currently he takes Metformin 1000 mg BID as an outpatient for diabetes control.  Patient reports that he is taking Metfromin as prescribed.  Patient states that he checks his glucose at home and it is always in the 200's mg/dl. Patient has all needed  testing supplies at home.  Inquired about prior A1C and patient reports that he does not know what an A1C is. Explained what an A1C is and discussed A1C results (10.2% on 02/11/18) and explained that his current A1C indicates an average glucose of 246 mg/dl over the past 2-3 months. Discussed glucose and A1C goals. Discussed importance of checking CBGs and maintaining good CBG control to prevent long-term and short-term complications. Explained how hyperglycemia leads to damage within blood vessels which lead to the common complications seen with uncontrolled diabetes. Stressed to the patient the importance of improving glycemic control to prevent further complications from uncontrolled diabetes especially with wound healing. Discussed impact of nutrition, exercise, stress, sickness, and medications on diabetes control. Inquired about any prior use of insulin and patient reports that he use to take insulin (via vial/syringe) in the past but it has been years since he took insulin at home. Inquired about willingness to take insulin at home if prescribed at discharge and patient is agreeable to take insulin again as an outpatient if prescribed. Encouraged patient to check his glucose at least 2 times per day and to keep a log book of glucose readings and insulin taken which he will need to take to doctor appointments. Explained how the doctor he follows up with can use the log book to continue to make adjustments with DM medications if needed. Encouraged patient to call and make a follow up appointment with PCP. Patient verbalized understanding of information discussed and he states that he has no further questions at this  time related to diabetes.  Thanks, Austin Penner, RN, Austin [name], CDE Diabetes Coordinator Inpatient Diabetes Program [phone] (Team Pager from 8am to 5pm)

## 2018-02-11 NOTE — Progress Notes (Signed)
Dr Caryn Bee notified of pt's request for laxative. Received order for milk of magnesium

## 2018-02-11 NOTE — Progress Notes (Addendum)
[facility] - [place] at [facility]   PATIENT NAME: Austin [name]    MR#:  [id number]  DATE OF BIRTH:  Jan 21, 1956  SUBJECTIVE:  CHIEF COMPLAINT: Patient had surgery 5/7  Reports pain is 10 out of 10 in his foot, received pain medications, reports pain meds are helping  REVIEW OF SYSTEMS:  CONSTITUTIONAL: No fever, fatigue or weakness.  EYES: No blurred or double vision.  EARS, NOSE, AND THROAT: No tinnitus or ear pain.  RESPIRATORY: No cough, shortness of breath, wheezing or hemoptysis.  CARDIOVASCULAR: No chest pain, orthopnea, edema.  GASTROINTESTINAL: No nausea, vomiting, diarrhea or abdominal pain.  GENITOURINARY: No dysuria, hematuria.  ENDOCRINE: No polyuria, nocturia,  HEMATOLOGY: No anemia, easy bruising or bleeding SKIN: No rash or lesion. MUSCULOSKELETAL: Left foot with dressing NEUROLOGIC: No tingling, numbness, weakness.  PSYCHIATRY: No anxiety or depression.   DRUG ALLERGIES:  No Known Allergies  VITALS:  Blood pressure 127/81, pulse 81, temperature 98 F (36.7 C), temperature source Oral, resp. rate 16, height  (1.93 m), weight 83 kg (183 lb), SpO2 96 %.  PHYSICAL EXAMINATION:  GENERAL:  62 y.o.-year-old patient lying in the bed with no acute distress.  EYES: Pupils equal, round, reactive to light and accommodation. No scleral icterus. Extraocular muscles intact.  HEENT: Head atraumatic, normocephalic. Oropharynx and nasopharynx clear.  NECK:  Supple, no jugular venous distention. No thyroid enlargement, no tenderness.  LUNGS: Normal breath sounds bilaterally, no wheezing, rales,rhonchi or crepitation. No use of accessory muscles of respiration.  CARDIOVASCULAR: S1, S2 normal. No murmurs, rubs, or gallops.  ABDOMEN: Soft, nontender, nondistended. Bowel sounds present. No organomegaly or mass.  EXTREMITIES: Left foot with clean dressing  No pedal edema, cyanosis, or clubbing.  NEUROLOGIC: Cranial nerves II through XII are intact.   Sensation intact. Gait not checked.  PSYCHIATRIC: The patient is alert and oriented x 3.  SKIN: No obvious rash, lesion, or ulcer.    LABORATORY PANEL:   CBC Recent Labs  Lab 02/11/18 0424  WBC 9.3  HGB 14.0  HCT 39.4*  PLT 170   ------------------------------------------------------------------------------------------------------------------  Chemistries  Recent Labs  Lab 02/11/18 0424  NA 136  K 3.9  CL 103  CO2 26  GLUCOSE 218*  BUN 10  CREATININE 0.76  CALCIUM 8.4*   ------------------------------------------------------------------------------------------------------------------  Cardiac Enzymes No results for input(s): TROPONINI in the last 168 hours. ------------------------------------------------------------------------------------------------------------------  RADIOLOGY:  No results found.  EKG:  No orders found for this or any previous visit.  ASSESSMENT AND PLAN:   62 year old male with past medical history of diabetes, hypertension, peripheral vascular disease, ongoing tobacco abuse to the hospital directly admitted from his podiatrist office due to left second toe gangrene.  1.  Left foot cellulitis/gangrene-  Had surgery 5/7  by podiatry Dr. Graciela Husbands.   Continue with broad-spectrum IV antibiotics with vancomycin, Zosyn. -  consult to vascular surgery- pending. -  Wound culture gram-positive cocci group B streptococcus isolated waiting for the sensitivities.  Will discontinue vancomycin if podiatry is agreeable  2.  Diabetes type 2 without complication- uncontrolled  hold metformin, place and sliding scale insulin.  Follow blood sugars.   hemoglobin A1c 10.2  Lantus 15 units   3.  Hyperlipidemia-continue atorvastatin.  4.  History of peripheral vascular disease/PAD- continue Plavix, statin.  Await further vascular surgery input.  5.  Essential hypertension-continue lisinopril.  6. Tobacco abuse - will place pt. On Nicotine patch.         All the records are  reviewed and case discussed with Care Management/Social Workerr. Management plans discussed with the patient, family and they are in agreement.  CODE STATUS: fc   TOTAL TIME TAKING CARE OF THIS PATIENT: 34  minutes.   POSSIBLE D/C IN 2-3 DAYS, DEPENDING ON CLINICAL CONDITION.  Note: This dictation was prepared with Dragon dictation along with smaller phrase technology. Any transcriptional errors that result from this process are unintentional.   Ramonita Lab M.D on 02/11/2018 at 1:57 PM  Between 7am to 6pm - Pager - [phone] After 6pm go to [email or web address] - password [email or web address] [facility]  [facility]  Office  [phone]  CC: Primary care physician; Austin Fleeting, MD

## 2018-02-11 NOTE — Progress Notes (Signed)
1 Day Post-Op   Subjective/Chief Complaint: Patient seen.  Still having some significant pain in his left foot at the amputation site.   Objective: Vital signs in last 24 hours: Temp:  [97 F (36.1 C)-98 F (36.7 C)] 98 F (36.7 C) (05/08 0756) Pulse Rate:  [62-86] 81 (05/08 0756) Resp:  [10-27] 16 (05/08 0756) BP: (103-159)/(51-82) 127/81 (05/08 0756) SpO2:  [95 %-100 %] 96 % (05/08 0756) Last BM Date: 02/08/18  Intake/Output from previous day: 05/07 0701 - 05/08 0700 In: 1210 [P.O.:360; I.V.:250; IV Piggyback:600] Out: 1705 [Urine:1700; Blood:5] Intake/Output this shift: Total I/O In: 240 [P.O.:240] Out: -   The dressing on the left foot is dry and intact.  No evidence of any strikethrough.  Lab Results:  Recent Labs    02/09/18 1634 02/11/18 0424  WBC 6.1 9.3  HGB 14.6 14.0  HCT 41.5 39.4*  PLT 157 170   BMET Recent Labs    02/09/18 1634 02/11/18 0424  NA 135 136  K 3.8 3.9  CL 98* 103  CO2 29 26  GLUCOSE 415* 218*  BUN 8 10  CREATININE 0.89 0.76  CALCIUM 9.0 8.4*   PT/INR No results for input(s): LABPROT, INR in the last 72 hours. ABG No results for input(s): PHART, HCO3 in the last 72 hours.  Invalid input(s): PCO2, PO2  Studies/Results: No results found.  Anti-infectives: Anti-infectives (From admission, onward)   Start     Dose/Rate Route Frequency Ordered Stop   02/10/18 0030  vancomycin (VANCOCIN) 1,500 mg in sodium chloride 0.9 % 500 mL IVPB     1,500 mg 250 mL/hr over 120 Minutes Intravenous Every 12 hours 02/09/18 2001     02/09/18 1700  piperacillin-tazobactam (ZOSYN) IVPB 3.375 g     3.375 g 12.5 mL/hr over 240 Minutes Intravenous Every 8 hours 02/09/18 1628     02/09/18 1630  vancomycin (VANCOCIN) IVPB 1000 mg/200 mL premix     1,000 mg 200 mL/hr over 60 Minutes Intravenous  Once 02/09/18 1628 02/09/18 1933      Assessment/Plan: s/p Procedure(s): AMPUTATION TOE (Left) Assessment: Status post amputation left second toe due  to gangrene.   Plan: Dressing left intact.  We will plan for a dressing change tomorrow.  Waiting on vascular assessment.  From a podiatry standpoint he should be stable for discharge after his dressing change tomorrow if everything is stable and on appropriate antibiotics.  LOS: 2 days    Ricci Barker 02/11/2018

## 2018-02-11 NOTE — Progress Notes (Signed)
Pt requesting medication for constipation. MD paged

## 2018-02-12 ENCOUNTER — Encounter
Admission: AD | Disposition: A | Discharge status: Home / Self Care | Payer: Self-pay | Source: Ambulatory Visit | Attending: Internal Medicine

## 2018-02-12 ENCOUNTER — Encounter: Payer: Self-pay | Admitting: Vascular Surgery

## 2018-02-12 DIAGNOSIS — I70262 Atherosclerosis of native arteries of extremities with gangrene, left leg: Secondary | ICD-10-CM

## 2018-02-12 HISTORY — PX: LOWER EXTREMITY INTERVENTION: CATH118252

## 2018-02-12 HISTORY — PX: LOWER EXTREMITY ANGIOGRAPHY: CATH118251

## 2018-02-12 LAB — GLUCOSE, CAPILLARY
Glucose-Capillary: 193 mg/dL — ABNORMAL HIGH (ref 65–99)
Glucose-Capillary: 142 mg/dL — ABNORMAL HIGH (ref 65–99)
Glucose-Capillary: 138 mg/dL — ABNORMAL HIGH (ref 65–99)

## 2018-02-12 LAB — SURGICAL PATHOLOGY

## 2018-02-12 SURGERY — LOWER EXTREMITY ANGIOGRAPHY
Anesthesia: Moderate Sedation | Laterality: Left

## 2018-02-12 MED ORDER — FENTANYL CITRATE (PF) 100 MCG/2ML IJ SOLN
INTRAMUSCULAR | Status: DC | PRN
  Administered 2018-02-12: 25 ug via INTRAVENOUS
  Administered 2018-02-12: 50 ug via INTRAVENOUS
  Administered 2018-02-12 (×2): 12.5 ug via INTRAVENOUS
  Administered 2018-02-12 (×2): 25 ug via INTRAVENOUS

## 2018-02-12 MED ORDER — FENTANYL CITRATE (PF) 100 MCG/2ML IJ SOLN
INTRAMUSCULAR | Status: AC
  Filled 2018-02-12: qty 2

## 2018-02-12 MED ORDER — ACETAMINOPHEN 325 MG PO TABS: 650.0000 mg | ORAL_TABLET | Freq: Four times a day (QID) | ORAL | Status: DC | PRN

## 2018-02-12 MED ORDER — MIDAZOLAM HCL 2 MG/2ML IJ SOLN
INTRAMUSCULAR | Status: DC | PRN
  Administered 2018-02-12 (×5): 1 mg via INTRAVENOUS
  Administered 2018-02-12: 2 mg via INTRAVENOUS

## 2018-02-12 MED ORDER — CLOPIDOGREL BISULFATE 75 MG PO TABS: 75.0000 mg | ORAL_TABLET | Freq: Every day | ORAL | 0 refills | Status: DC

## 2018-02-12 MED ORDER — HEPARIN SODIUM (PORCINE) 1000 UNIT/ML IJ SOLN
INTRAMUSCULAR | Status: AC
  Filled 2018-02-12: qty 1

## 2018-02-12 MED ORDER — HEPARIN SODIUM (PORCINE) 1000 UNIT/ML IJ SOLN
INTRAMUSCULAR | Status: DC | PRN
  Administered 2018-02-12: 5000 [IU] via INTRAVENOUS

## 2018-02-12 MED ORDER — APIXABAN 5 MG PO TABS: 5.0000 mg | ORAL_TABLET | Freq: Two times a day (BID) | ORAL | Status: DC

## 2018-02-12 MED ORDER — MIDAZOLAM HCL 5 MG/5ML IJ SOLN
INTRAMUSCULAR | Status: AC
  Filled 2018-02-12: qty 5

## 2018-02-12 MED ORDER — FENTANYL CITRATE (PF) 100 MCG/2ML IJ SOLN
INTRAMUSCULAR | Status: AC
Start: 2018-02-12 — End: ?
  Filled 2018-02-12: qty 2

## 2018-02-12 MED ORDER — AMOXICILLIN-POT CLAVULANATE 875-125 MG PO TABS: 1.0000 | ORAL_TABLET | Freq: Two times a day (BID) | ORAL | Status: DC

## 2018-02-12 MED ORDER — SODIUM CHLORIDE 0.9 % IV SOLN
INTRAVENOUS | Status: DC
  Administered 2018-02-12: 09:00:00 via INTRAVENOUS

## 2018-02-12 MED ORDER — AMOXICILLIN-POT CLAVULANATE 875-125 MG PO TABS: 1.0000 | ORAL_TABLET | Freq: Two times a day (BID) | ORAL | 0 refills | Status: DC

## 2018-02-12 MED ORDER — CLOPIDOGREL BISULFATE 75 MG PO TABS: 75.0000 mg | ORAL_TABLET | Freq: Every day | ORAL | Status: DC

## 2018-02-12 MED ORDER — INSULIN GLARGINE 100 UNIT/ML ~~LOC~~ SOLN: 15.0000 [IU] | Freq: Every day | SUBCUTANEOUS | 1 refills | Status: DC

## 2018-02-12 MED ORDER — DOCUSATE SODIUM 100 MG PO CAPS: 100.0000 mg | ORAL_CAPSULE | Freq: Two times a day (BID) | ORAL | 0 refills | Status: DC

## 2018-02-12 MED ORDER — NICOTINE 21 MG/24HR TD PT24: 21.0000 mg | MEDICATED_PATCH | Freq: Every day | TRANSDERMAL | 0 refills | Status: DC

## 2018-02-12 MED ORDER — ATORVASTATIN CALCIUM 80 MG PO TABS: 80.0000 mg | ORAL_TABLET | Freq: Every day | ORAL | 0 refills | Status: DC

## 2018-02-12 MED ORDER — ASPIRIN EC 81 MG PO TBEC
81.0000 mg | DELAYED_RELEASE_TABLET | Freq: Every day | ORAL | Status: DC
Start: 2018-02-12 — End: 2018-02-12
  Filled 2018-02-12 (×2): qty 1

## 2018-02-12 MED ORDER — OXYCODONE HCL 5 MG PO TABS: 5.0000 mg | ORAL_TABLET | ORAL | 0 refills | Status: DC | PRN

## 2018-02-12 SURGICAL SUPPLY — 30 items
BALLN DORADO 5X200X135 (BALLOONS) ×4
BALLN LUTONIX 018 5X220X130 (BALLOONS) ×4
BALLN LUTONIX 5X150X130 (BALLOONS) ×4
BALLN LUTONIX 6X220X130 (BALLOONS) ×4
BALLN LUTONIX AV 8X40X75 (BALLOONS) ×4
BALLN ULTRVRSE 2.5X300X150 (BALLOONS) ×4
BALLOON DORADO 5X200X135 (BALLOONS) ×2 IMPLANT
BALLOON LUTONIX 018 5X220X130 (BALLOONS) ×2 IMPLANT
BALLOON LUTONIX 5X150X130 (BALLOONS) ×2 IMPLANT
BALLOON LUTONIX 6X220X130 (BALLOONS) ×2 IMPLANT
BALLOON LUTONIX AV 8X40X75 (BALLOONS) ×2 IMPLANT
BALLOON ULTRVRSE 2.5X300X150 (BALLOONS) ×2 IMPLANT
CATH BEACON 5 .035 65 KMP TIP (CATHETERS) ×4 IMPLANT
CATH BEACON 5 .038 100 VERT TP (CATHETERS) ×4 IMPLANT
CATH CXI 4F 90 DAV (CATHETERS) ×4 IMPLANT
CATH CXI SUPP ANG 4FR 135 (CATHETERS) ×2 IMPLANT
CATH CXI SUPP ANG 4FR 135CM (CATHETERS) ×4
CATH PIG 70CM (CATHETERS) ×4 IMPLANT
DEVICE PRESTO INFLATION (MISCELLANEOUS) ×4 IMPLANT
DEVICE STARCLOSE SE CLOSURE (Vascular Products) ×4 IMPLANT
GLIDEWIRE ADV .035X180CM (WIRE) ×4 IMPLANT
PACK ANGIOGRAPHY (CUSTOM PROCEDURE TRAY) ×4 IMPLANT
SHEATH BRITE TIP 5FRX11 (SHEATH) ×4 IMPLANT
SHEATH PINNACLE ST 6F 45CM (SHEATH) ×4 IMPLANT
STENT LIFESTREAM 7X26X80 (Permanent Stent) ×4 IMPLANT
STENT VIABAHN 6X250X120 (Permanent Stent) ×8 IMPLANT
SYR MEDRAD MARK V 150ML (SYRINGE) ×4 IMPLANT
TUBING CONTRAST HIGH PRESS 72 (TUBING) ×4 IMPLANT
WIRE G V18X300CM (WIRE) ×4 IMPLANT
WIRE J 3MM .035X145CM (WIRE) ×4 IMPLANT

## 2018-02-12 NOTE — Op Note (Signed)
[facility]  Percutaneous Study/Intervention Procedural Note   Date of Surgery: 02/12/2018  Surgeon(s):DEW,JASON    Assistants:none  Pre-operative Diagnosis: PAD with gangrene left lower extremity  Post-operative diagnosis:  Same  Procedure(s) Performed:             1.  Ultrasound guidance for vascular access right femoral artery             2.  Catheter placement into left peroneal artery from right femoral approach             3.  Aortogram and selective left lower extremity angiogram             4.  Percutaneous transluminal angioplasty of left peroneal artery and tibioperoneal trunk with 2.5 mm diameter by 30 cm length angioplasty balloon             5.   Percutaneous transluminal angioplasty of the left SFA and popliteal arteries with a 5 mm diameter by 22 cm length [name] drug-coated angioplasty balloon distally and a 6 mm diameter by 22 cm length [name] drug-coated angioplasty balloon proximally  6.  Viabahn stent placement to the left SFA and popliteal arteries with two 6 mm diameter by 25 cm length stents for multiple areas of greater than 50% residual stenosis and dissection after angioplasty  7.  Lifestream covered stent placement to the left common iliac artery with 7 mm diameter by 26 mm length stent postdilated with an 8 mm [name] drug-coated balloon             8.  StarClose closure device right femoral artery  EBL: 10 cc  Contrast: 100 cc  Fluoro Time: 11.1 minutes  Moderate Conscious Sedation Time: approximately 45 minutes using 7 mg of Versed and 150 Mcg of Fentanyl              Indications:  Patient is a 62 y.o.male with a gangrenous toe on the left foot.  He has a long history of peripheral arterial disease and has had previous interventions. The patient is brought in for angiography for further evaluation and potential treatment.  Due to the limb threatening nature of the situation, angiogram was performed for attempted limb salvage. The  patient is aware that if the procedure fails, amputation would be expected.  The patient also understands that even with successful revascularization, amputation may still be required due to the severity of the situation.  Risks and benefits are discussed and informed consent is obtained.   Procedure:  The patient was identified and appropriate procedural time out was performed.  The patient was then placed supine on the table and prepped and draped in the usual sterile fashion. Moderate conscious sedation was administered during a face to face encounter with the patient throughout the procedure with my supervision of the RN administering medicines and monitoring the patient's vital signs, pulse oximetry, telemetry and mental status throughout from the start of the procedure until the patient was taken to the recovery room. Ultrasound was used to evaluate the right common femoral artery.  It was patent .  A digital ultrasound image was acquired.  A Seldinger needle was used to access the right common femoral artery under direct ultrasound guidance and a permanent image was performed.  A 0.035 J wire was advanced without resistance and a 5Fr sheath was placed.  Pigtail catheter was placed into the aorta and an AP aortogram was performed. This demonstrated normal renal arteries and normal aorta and  iliac segments on the right without significant stenosis.  The previously placed stents in the right external and common iliac arteries are widely patent.  The left common iliac artery had a very irregular appearing lesion in the 70% range.  This had the appearance to either have been from embolic disease that lodged on a native plaque or could have created an embolic phenomenon to the lower extremity.  The external iliac artery did not have significant stenosis.  I then crossed the aortic bifurcation and advanced to the left femoral head. Selective left lower extremity angiogram was then performed. This demonstrated  normal common femoral artery and profunda femoris artery.  The superficial femoral artery occluded 3 to 4 cm beyond its origin and reconstituted in the above-knee popliteal artery.  There was then a normal tibial trifurcation with the posterior tibial artery being the dominant runoff to the foot.  The peroneal artery was continuous but had a greater than 80% stenosis proximally as well as areas in the mid to distal peroneal artery with greater than 70% stenosis.  The anterior tibial artery did not have an obvious focal stenosis and was small but did provide some blood flow distally.. The patient was systemically heparinized and a 6 French 45 cm sheath was then placed over the Terumo Advantage wire. I then used a Kumpe catheter and the advantage wire to navigate through the SFA and popliteal occlusion and confirm intraluminal flow in the popliteal artery at the knee.  I then exchanged for a CXI catheter and a 0.018 wire and navigated down into the tibial vessels.  I felt that treating the peroneal artery may provide a little extra runoff distally and so across the peroneal lesions with the 0.018 wire.  The peroneal artery up to the tibioperoneal trunk was treated with a 2.5 mm diameter by 30 cm length angioplasty balloon inflated to 12 atm for 1 minute.  Completion angiogram showed less than 30% stenosis in the areas of previous lesions.  I then treated the SFA and popliteal arteries.  For the distal portion a 5 mm diameter by 22 cm length [name] drug-coated angioplasty balloon was inflated to 12 atm for 1 minute.  For the proximal and mid SFA a 6 mm diameter by 22 cm length [name] drug-coated angioplasty balloon was inflated to 12 atm for 1 minute.  Completion angiogram showed a shelf proximally with a high-grade stenosis at the original entry point, dissection throughout the treated areas, and a high-grade stenosis at the reentry point in the above-knee popliteal artery of greater than 80%.  I elected to treat  these areas with stents.  6 mm diameter by 25 cm length Viabahn stent was deployed first in the popliteal artery and distal SFA.  A second 6 mm diameter by 25 cm length Viabahn stent was deployed up to about 2 cm from the origin of the SFA.  These were postdilated with a 5 mm high-pressure balloon with excellent angiographic completion result and less than 20% residual stenosis.  I then turned my attention to the irregular iliac lesion.  Due to the embolic potential, a covered stent was selected.  A 7 mm diameter by 26 mm length lifestream stent was then selected and deployed in the left common iliac artery encompassing the lesion.  This was then postdilated with an 8 mm diameter by 4 cm length [name] drug-coated angioplasty balloon with excellent angiographic result and no significant residual stenosis. I elected to terminate the procedure. The sheath was removed and  StarClose closure device was deployed in the right femoral artery with excellent hemostatic result. The patient was taken to the recovery room in stable condition having tolerated the procedure well.  Findings:               Aortogram:  This demonstrated normal renal arteries and normal aorta and iliac segments on the right without significant stenosis.  The previously placed stents in the right external and common iliac arteries are widely patent.  The left common iliac artery had a very irregular appearing lesion in the 70% range.  This had the appearance to either have been from embolic disease that lodged on a native plaque or could have created an embolic phenomenon to the lower extremity.  The external iliac artery did not have significant stenosis.             Left lower Extremity:  Normal common femoral artery and profunda femoris artery.  The superficial femoral artery occluded 3 to 4 cm beyond its origin and reconstituted in the above-knee popliteal artery.  There was then a normal tibial trifurcation with the posterior tibial artery  being the dominant runoff to the foot.  The peroneal artery was continuous but had a greater than 80% stenosis proximally as well as areas in the mid to distal peroneal artery with greater than 70% stenosis.  The anterior tibial artery did not have an obvious focal stenosis and was small but did provide some blood flow distally.   Disposition: Patient was taken to the recovery room in stable condition having tolerated the procedure well.  Complications: None  Leotis Pain 02/12/2018 11:24 AM   This note was created with Dragon Medical transcription system. Any errors in dictation are purely unintentional.

## 2018-02-12 NOTE — Discharge Instructions (Signed)
Follow-up with vascular surgery Dr. due in 2 weeks Follow-up with cardiology Dr.  call wood in a week Follow-up with Dr. Tawanna Cooler clinepodiatry in 1 week Follow-up with primary care physician in a week

## 2018-02-12 NOTE — Progress Notes (Signed)
Austin Boos, RN called and spoke with Dr. Amado Coe re: Afib discovered when placed on monitor here in [facility]. No new orders at present. Pt. Remains NPO for LE angiogram this AM.

## 2018-02-12 NOTE — Care Management (Signed)
RNCM consult received for medication needs. Patient is fully insured with medication coverage. There is no additional assistance available to assist with his medication cost.

## 2018-02-12 NOTE — Progress Notes (Signed)
Spoke with patient @ bedside and patient states he has been administering insulin and feels competent and comfortable with administering to himself. States he has been on insulin in the past. Nurses, please continue to educate patient and allow patient to give insulin.  Thank you, Billy Fischer. Meer Reindl, RN, MSN, CDE  Diabetes Coordinator Inpatient Glycemic Control Team Team Pager [phone] (8am-5pm) 02/12/2018 2:35 PM

## 2018-02-12 NOTE — Discharge Summary (Addendum)
[facility] [place] [facility]   PATIENT NAME: Austin [name]    MR#:  [id number]  DATE OF BIRTH:  10/08/1955  DATE OF ADMISSION:  02/09/2018 ADMITTING PHYSICIAN: Houston Siren, MD  DATE OF DISCHARGE:  02/12/18  PRIMARY CARE PHYSICIAN: Preston Fleeting, MD    ADMISSION DIAGNOSIS:  osteomyelitis gangrene  DISCHARGE DIAGNOSIS:  Active Problems:   Gangrene of left foot (HCC) Chronic atrial fibrillation Peripheral vascular disease status post 3stent placement 02/12/18  SECONDARY DIAGNOSIS:   Past Medical History:  Diagnosis Date  . Diabetes (HCC)   . DVT (deep venous thrombosis) [facility]     HOSPITAL COURSE:  hpi  Austin [name]  is a 62 y.o. male with a known history of diabetes, hypertension, peripheral vascular disease who presents to the hospital from the podiatrist office due to left second toe gangrene.  Patient says that a few weeks ago his toenail fell off on the second toe on the left foot.  Over the past few days his left toe has become more black in color and he has developed significant pain in that left foot and is having difficulty putting any weight on it.  He went to see his podiatrist who noticed that he had gangrene with some ascending cellulitis on the left foot.  He was directly admitted to the hospital for treatment for underlying osteomyelitis with possible need for amputation.  Patient presently denies any other associated symptoms of fevers, chills, nausea, vomiting, chest pain, shortness of breath.     1. Left foot cellulitis/gangrene- Had surgery 5/7  by podiatry Dr. Graciela Husbands.   Continue with broad-spectrum IV antibiotics with vancomycin, Zosyn.  Okay to discharge patient with p.o. Augmentin as recommended by podiatry.  Wound dressing changed by podiatrist today and no dressing changes needed until seen by Dr. Liz Beach in a week -Patient has orthotic shoe, and rolling walker ordered -pT evaluation done - Wound culture  gram-positive cocci group B streptococcus isolated waiting for the sensitivities.  Will discontinue vancomycin if podiatry is agreeable  2. Diabetes type 2 without complication- uncontrolled hold metformin, place and sliding scale insulin. Follow blood sugars.   hemoglobin A1c 10.2  Lantus 15 units   3. Hyperlipidemia-continue atorvastatin.  4. History of peripheral vascular disease/PAD-continue Plavix, statin.  Eliquis 5 mg twice a day is added to the regimen Patient had angioplasty and 3 stents placed today by Dr. Wyn Quaker Discussed with cardiology and plan is to continue Eliquis and Plavix and statin recommended to discontinue aspirin  5. Essential hypertension-continue lisinopril.  6.  Chronic atrial fibrillation rate controlled Patient stopped taking Eliquis without discussing with cardiologist Patient is seen by Dr. Gwen Pounds who has recommended to resume Eliquis and outpatient follow-up with Dr. call wood  Tobacco abuse - will place pt. On Nicotine patch.     DISCHARGE CONDITIONS:   fair  CONSULTS OBTAINED:  Treatment Team:  Recardo Evangelist, Estrellita Ludwig, DPM Annice Needy, MD Lamar Blinks, MD   PROCEDURES Sttatus post amputation left second toe     DRUG ALLERGIES:  No Known Allergies  DISCHARGE MEDICATIONS:   Allergies as of 02/12/2018   No Known Allergies     Medication List    STOP taking these medications   metFORMIN 500 MG tablet Commonly known as:  GLUCOPHAGE   naproxen 500 MG tablet Commonly known as:  NAPROSYN     TAKE these medications   acetaminophen 325 MG tablet Commonly known as:  TYLENOL  Take 2 tablets (650 mg total) by mouth every 6 (six) hours as needed for mild pain (or Fever >/= 101).   amoxicillin-clavulanate 875-125 MG tablet Commonly known as:  AUGMENTIN Take 1 tablet by mouth every 12 (twelve) hours.   apixaban 5 MG Tabs tablet Commonly known as:  ELIQUIS Take 1 tablet (5 mg total) by mouth 2 (two) times  daily.   atorvastatin 80 MG tablet Commonly known as:  LIPITOR Take 1 tablet (80 mg total) by mouth daily at 6 PM. What changed:    how much to take  how to take this  when to take this   clopidogrel 75 MG tablet Commonly known as:  PLAVIX Take 1 tablet (75 mg total) by mouth daily.   docusate sodium 100 MG capsule Commonly known as:  COLACE Take 1 capsule (100 mg total) by mouth 2 (two) times daily.   insulin glargine 100 UNIT/ML injection Commonly known as:  LANTUS Inject 0.15 mLs (15 Units total) into the skin daily. Start taking on:  02/13/2018   lisinopril 10 MG tablet Commonly known as:  PRINIVIL,ZESTRIL Take 10 mg by mouth daily.   nicotine 21 mg/24hr patch Commonly known as:  NICODERM CQ - dosed in mg/24 hours Place 1 patch (21 mg total) onto the skin daily.   oxyCODONE 5 MG immediate release tablet Commonly known as:  Oxy IR/ROXICODONE Take 1 tablet (5 mg total) by mouth every 4 (four) hours as needed for moderate pain or breakthrough pain.            Durable Medical Equipment  (From admission, onward)        Start     Ordered   02/12/18 1558  For home use only DME Walker rolling  Once    Comments:  Rolling walker with 5 inch wheels  Question:  Patient needs a walker to treat with the following condition  Answer:  S/P foot surgery   02/12/18 1558       DISCHARGE INSTRUCTIONS:  Follow-up with endocrinology DR. Bhakti pal in 2 weeks Follow-up with vascular surgery Dr. due in 2 weeks Follow-up with cardiology Dr.  call wood in a week Follow-up with Dr. Tawanna Cooler clinepodiatry in 1 week Follow-up with primary care physician in a week Continue home health, wound dressing as recommended by podiatry, no wound dressing changes needed until seen by podiatry in a week at this time  DIET:  Cardiac diet and Diabetic diet  DISCHARGE CONDITION:  Fair  ACTIVITY:  Activity as tolerated per podiatry recommendations with orthotic shoe and walker  OXYGEN:   Home Oxygen: No.   Oxygen Delivery: room air  DISCHARGE LOCATION:  home   If you experience worsening of your admission symptoms, develop shortness of breath, life threatening emergency, suicidal or homicidal thoughts you must seek medical attention immediately by calling 911 or calling your MD immediately  if symptoms less severe.  You Must read complete instructions/literature along with all the possible adverse reactions/side effects for all the Medicines you take and that have been prescribed to you. Take any new Medicines after you have completely understood and accpet all the possible adverse reactions/side effects.   Please note  You were cared for by a hospitalist during your hospital stay. If you have any questions about your discharge medications or the care you received while you were in the hospital after you are discharged, you can call the unit and asked to speak with the hospitalist on call if the hospitalist that  took care of you is not available. Once you are discharged, your primary care physician will handle any further medical issues. Please note that NO REFILLS for any discharge medications will be authorized once you are discharged, as it is imperative that you return to your primary care physician (or establish a relationship with a primary care physician if you do not have one) for your aftercare needs so that they can reassess your need for medications and monitor your lab values.     Today  No chief complaint on file.  Patient is feeling fine  ROS:  CONSTITUTIONAL: Denies fevers, chills. Denies any fatigue, weakness.  EYES: Denies blurry vision, double vision, eye pain. EARS, NOSE, THROAT: Denies tinnitus, ear pain, hearing loss. RESPIRATORY: Denies cough, wheeze, shortness of breath.  CARDIOVASCULAR: Denies chest pain, palpitations, edema.  GASTROINTESTINAL: Denies nausea, vomiting, diarrhea, abdominal pain. Denies bright red blood per rectum. GENITOURINARY:  Denies dysuria, hematuria. ENDOCRINE: Denies nocturia or thyroid problems. HEMATOLOGIC AND LYMPHATIC: Denies easy bruising or bleeding. SKIN: Denies rash or lesion. MUSCULOSKELETAL: Denies pain in neck, back, shoulder, knees, hips or arthritic symptoms.  NEUROLOGIC: Denies paralysis, paresthesias.  PSYCHIATRIC: Denies anxiety or depressive symptoms.   VITAL SIGNS:  Blood pressure 106/74, pulse (!) 59, temperature (!) 97.3 F (36.3 C), temperature source Axillary, resp. rate 18, height  (1.93 m), weight 83 kg (183 lb), SpO2 98 %.  I/O:    Intake/Output Summary (Last 24 hours) at 02/12/2018 1605 Last data filed at 02/12/2018 0440 Gross per 24 hour  Intake 360 ml  Output 250 ml  Net 110 ml    PHYSICAL EXAMINATION:  GENERAL:  62 y.o.-year-old patient lying in the bed with no acute distress.  EYES: Pupils equal, round, reactive to light and accommodation. No scleral icterus. Extraocular muscles intact.  HEENT: Head atraumatic, normocephalic. Oropharynx and nasopharynx clear.  NECK:  Supple, no jugular venous distention. No thyroid enlargement, no tenderness.  LUNGS: Normal breath sounds bilaterally, no wheezing, rales,rhonchi or crepitation. No use of accessory muscles of respiration.  CARDIOVASCULAR: S1, S2 normal. No murmurs, rubs, or gallops.  ABDOMEN: Soft, non-tender, non-distended. Bowel sounds present. No organomegaly or mass.  EXTREMITIES: No pedal edema, cyanosis, or clubbing.  NEUROLOGIC: Cranial nerves II through XII are intact. Muscle strength 5/5 in all extremities. Sensation intact. Gait not checked.  PSYCHIATRIC: The patient is alert and oriented x 3.  SKIN: No obvious rash, lesion, or ulcer.   DATA REVIEW:   CBC Recent Labs  Lab 02/11/18 0424  WBC 9.3  HGB 14.0  HCT 39.4*  PLT 170    Chemistries  Recent Labs  Lab 02/11/18 0424  NA 136  K 3.9  CL 103  CO2 26  GLUCOSE 218*  BUN 10  CREATININE 0.76  CALCIUM 8.4*    Cardiac Enzymes No results  for input(s): TROPONINI in the last 168 hours.  Microbiology Results  Results for orders placed or performed during the hospital encounter of 02/09/18  Surgical pcr screen     Status: None   Collection Time: 02/09/18  9:34 PM  Result Value Ref Range Status   MRSA, PCR NEGATIVE NEGATIVE Final   Staphylococcus aureus NEGATIVE NEGATIVE Final    Comment: (NOTE) The Xpert SA Assay (FDA approved for NASAL specimens in patients 66 years of age and older), is one component of a comprehensive surveillance program. It is not intended to diagnose infection nor to guide or monitor treatment. Performed at [facility], [address] [place],  [place] [address]   Aerobic/Anaerobic Culture (surgical/deep wound)     Status: None (Preliminary result)   Collection Time: 02/10/18 12:59 PM  Result Value Ref Range Status   Specimen Description   Final    WOUND Performed at [facility], [address] [place], [place] [address]    Special Requests   Final    DEEP WOUND LEFT FOOT Performed at [facility], [address] [place], [place] [address]    Gram Stain   Final    MODERATE WBC PRESENT,BOTH PMN AND MONONUCLEAR FEW GRAM POSITIVE COCCI    Culture   Final    MODERATE GROUP B STREP(S.AGALACTIAE)ISOLATED TESTING AGAINST S. AGALACTIAE NOT ROUTINELY PERFORMED DUE TO PREDICTABILITY OF AMP/PEN/VAN SUSCEPTIBILITY. Performed at [facility], [address], [place], [place] [address]    Report Status PENDING  Incomplete    RADIOLOGY:  No results found.  EKG:  No orders found for this or any previous visit.    Management plans discussed with the patient, family and they are in agreement.  CODE STATUS:     Code Status Orders  (From admission, onward)        Start     Ordered   02/09/18 1620  Full code  Continuous     02/09/18 1622    Code Status History    This patient has a current code status but no historical code status.      TOTAL  TIME TAKING CARE OF THIS PATIENT:  43  minutes.   Note: This dictation was prepared with Dragon dictation along with smaller phrase technology. Any transcriptional errors that result from this process are unintentional.   @  on 02/12/2018 at 4:05 PM  Between 7am to 6pm - Pager - [phone]  After 6pm go to [email or web address] - password [email or web address] [facility]  [facility]  Office  [phone]  CC: Primary care physician; Preston Fleeting, MD

## 2018-02-12 NOTE — Consult Note (Signed)
[facility] Cardiology Consultation Note  Patient ID: AN SCHNABEL, MRN: [id number], DOB/AGE: 05-20-1956 62 y.o. Admit date: 02/09/2018   Date of Consult: 02/12/2018 Primary Physician: Preston Fleeting, MD Primary Cardiologist: Call would  Chief Complaint: No chief complaint on file.  Reason for Consult: Atrial fibrillation  HPI: 62 y.o. male with known essential hypertension mixed hyperlipidemia diabetes with significant cardiovascular complications with that concern having significant left toe gangrene needing further treatment.  The patient did have treatment of his gangrene with antibiotics but needed further peripheral vascular disease assessment and treatment.  He has undergone a PCI and stent placement of multiple areas in his left leg for which appears to be performed without complication.  The patient has not had any evidence of angina or congestive heart failure or myocardial infarction in recent past.  He has been on high intensity cholesterol therapy antihypertensive medication management and dual antiplatelet therapy.  He has now new onset atrial fibrillation with controlled ventricular rate at 60 bpm.  There is no evidence of significant side effects or symptoms of this atrial fibrillation and there is good heart rate control.  With the patient's a high CHADS2 score the patient would require anticoagulation.  Currently the appropriate therapy would be single antiplatelet therapy including Plavix and Eliquis 5 mg twice per day.  We will stop the aspirin at this time to reduce the possibility of significant bleeding complications  Past Medical History:  Diagnosis Date  . Diabetes (HCC)   . DVT (deep venous thrombosis) [facility])       Surgical History:  Past Surgical History:  Procedure Laterality Date  . AMPUTATION TOE Left 02/10/2018   Procedure: AMPUTATION TOE;  Surgeon: Linus Galas, DPM;  Location: [facility] ORS;  Service: Podiatry;  Laterality: Left;  . groin surgery    .  NECK SURGERY       Home Meds: Prior to Admission medications   Medication Sig Start Date End Date Taking? Authorizing Provider  clopidogrel (PLAVIX) 75 MG tablet Take by mouth.    [provider]  lisinopril (PRINIVIL,ZESTRIL) 10 MG tablet Take by mouth.    [provider]  metFORMIN (GLUCOPHAGE) 500 MG tablet Take 1,000 mg by mouth 2 (two) times daily with a meal.     [provider]  naproxen (NAPROSYN) 500 MG tablet Take 1 tablet (500 mg total) by mouth 2 (two) times daily with a meal. Patient not taking: Reported on 02/09/2018 10/17/17   Lutricia Feil, PA-C    Inpatient Medications:  . aspirin EC  81 mg Oral Daily  . [MAR Hold] atorvastatin  80 mg Oral q1800  . [MAR Hold] clopidogrel  75 mg Oral Daily  . clopidogrel  75 mg Oral Daily  . [MAR Hold] docusate sodium  100 mg Oral BID  . [MAR Hold] enoxaparin (LOVENOX) injection  40 mg Subcutaneous Q24H  . [MAR Hold] insulin aspart  0-5 Units Subcutaneous QHS  . [MAR Hold] insulin aspart  0-9 Units Subcutaneous TID WC  . [MAR Hold] insulin glargine  15 Units Subcutaneous Daily  . [MAR Hold] lisinopril  10 mg Oral Daily  . [MAR Hold] nicotine  21 mg Transdermal Daily   . sodium chloride 20 mL/hr at 02/12/18 0910  . [MAR Hold] piperacillin-tazobactam (ZOSYN)  IV 3.375 g (02/12/18 0041)  . [MAR Hold] vancomycin 1,500 mg (02/12/18 0041)    Allergies: No Known Allergies  Social History   Socioeconomic History  . Marital status: Divorced  Spouse name: Not on file  . Number of children: Not on file  . Years of education: Not on file  . Highest education level: Not on file  Occupational History  . Not on file  Social Needs  . Financial resource strain: Not on file  . Food insecurity:    Worry: Not on file    Inability: Not on file  . Transportation needs:    Medical: Not on file    Non-medical: Not on file  Tobacco Use  . Smoking status: Heavy Tobacco Smoker    Packs/day: 1.00    Years: 30.00     Pack years: 30.00    Types: Cigarettes  . Smokeless tobacco: Never Used  Substance and Sexual Activity  . Alcohol use: No  . Drug use: No  . Sexual activity: Not on file  Lifestyle  . Physical activity:    Days per week: Not on file    Minutes per session: Not on file  . Stress: Not on file  Relationships  . Social connections:    Talks on phone: Not on file    Gets together: Not on file    Attends religious service: Not on file    Active member of club or organization: Not on file    Attends meetings of clubs or organizations: Not on file    Relationship status: Not on file  . Intimate partner violence:    Fear of current or ex partner: Not on file    Emotionally abused: Not on file    Physically abused: Not on file    Forced sexual activity: Not on file  Other Topics Concern  . Not on file  Social History Narrative  . Not on file     Family History  Problem Relation Age of Onset  . Diabetes Mother   . Coronary artery disease Father   . Hypertension Sister   . Leukemia Brother      Review of Systems Positive for leg pain fatigue Negative for: General:  chills, fever, night sweats or weight changes.  Cardiovascular: PND orthopnea syncope dizziness  Dermatological skin lesions rashes Respiratory: Cough congestion Urologic: Frequent urination urination at night and hematuria Abdominal: negative for nausea, vomiting, diarrhea, bright red blood per rectum, melena, or hematemesis Neurologic: negative for visual changes, and/or hearing changes  All other systems reviewed and are otherwise negative except as noted above.  Labs: No results for input(s): CKTOTAL, CKMB, TROPONINI in the last 72 hours. Lab Results  Component Value Date   WBC 9.3 02/11/2018   HGB 14.0 02/11/2018   HCT 39.4 (L) 02/11/2018   MCV 86.4 02/11/2018   PLT 170 02/11/2018    Recent Labs  Lab 02/11/18 0424  NA 136  K 3.9  CL 103  CO2 26  BUN 10  CREATININE 0.76  CALCIUM 8.4*   GLUCOSE 218*   Lab Results  Component Value Date   CHOL 166 11/14/2013   HDL 23 (L) 11/14/2013   LDLCALC 106 (H) 11/14/2013   TRIG 187 11/14/2013   No results found for: DDIMER  Radiology/Studies:  No results found.  EKG: Atrial fibrillation with controlled ventricular rate nonspecific ST changes  Weights: Filed Weights   02/09/18 1623 02/10/18 1159 02/12/18 0857  Weight: 183 lb 9.6 oz (83.3 kg) 183 lb (83 kg) 183 lb (83 kg)     Physical Exam: Blood pressure 106/87, pulse 79, temperature 97.7 F (36.5 C), temperature source Oral, resp. rate 15, height  (1.93  m), weight 183 lb (83 kg), SpO2 98 %. Body mass index is 22.28 kg/m. General: Well developed, well nourished, in no acute distress. Head eyes ears nose throat: Normocephalic, atraumatic, sclera non-icteric, no xanthomas, nares are without discharge. No apparent thyromegaly and/or mass  Lungs: Normal respiratory effort.  Few wheezes, no rales, no rhonchi.  Heart: RRR with normal S1 S2. no murmur gallop, no rub, PMI is normal size and placement, carotid upstroke normal without bruit, jugular venous pressure is normal Abdomen: Soft, non-tender, non-distended with normoactive bowel sounds. No hepatomegaly. No rebound/guarding. No obvious abdominal masses. Abdominal aorta is normal size without bruit Extremities: Trace edema.  Positive cyanosis, no clubbing, positive ulcers  Peripheral : 2+ bilateral upper extremity pulses, 1 + bilateral femoral pulses, 0 + bilateral dorsal pedal pulse Neuro: Alert and oriented. No facial asymmetry. No focal deficit. Moves all extremities spontaneously. Musculoskeletal: Normal muscle tone without kyphosis Psych:  Responds to questions appropriately with a normal affect.    Assessment: 62 year old male with diabetes with multiple complications essential hypertension mixed hyperlipidemia peripheral vascular disease with gangrene status post PCI's and stent placement needing single  antiplatelet therapy and a new onset atrial fibrillation with controlled ventricular rate needing anticoagulation  Plan: 1.  Continue supportive care for recent PCI and stent placement of leg and perfusion 2.  Single antiplatelet therapy for peripheral vascular disease including Plavix 75 mg each day 3.  Stop aspirin due to concerns of bleeding complications with anticoagulation 4.  Eliquis 5 mg twice per day for risk reduction cardiovascular event and stroke with atrial fibrillation 5.  Continue ACE inhibitor for hypertension control 6.  No further cardiac diagnostics necessary at this time 7.  Intensity cholesterol therapy 8.  Follow-up further evaluation and treatment options and medication management in 1 week  Signed, Lamar Blinks M.D. [facility] Cardiology 02/12/2018, 12:33 PM

## 2018-02-12 NOTE — Progress Notes (Signed)
Patient is being discharged home with family. Post-op shoe in place. IVs removed. Scripts, meds, and last dose given reviewed. Allowed time for questions.

## 2018-02-12 NOTE — Progress Notes (Signed)
Dr. Wyn Quaker at bedside; made aware of pt. AFib. To procede with procedure.

## 2018-02-12 NOTE — Progress Notes (Signed)
2 Days Post-Op   Subjective/Chief Complaint: Patient seen.  Still having some significant pain in his left foot.  States it jerks occasionally.   Objective: Vital signs in last 24 hours: Temp:  [97.7 F (36.5 C)-98.6 F (37 C)] 98.6 F (37 C) (05/08 2340) Pulse Rate:  [63-79] 63 (05/08 2340) Resp:  [18] 18 (05/08 2340) BP: (106-134)/(59-83) 106/59 (05/08 2340) SpO2:  [96 %-100 %] 96 % (05/08 2340) Last BM Date: 02/08/18  Intake/Output from previous day: 05/08 0701 - 05/09 0700 In: 960 [P.O.:960] Out: 950 [Urine:950] Intake/Output this shift: No intake/output data recorded.  Bandage on the left foot is dry and intact.  Upon removal the incision is well coapted with just a small amount of bleeding and drainage.  Lab Results:  Recent Labs    02/09/18 1634 02/11/18 0424  WBC 6.1 9.3  HGB 14.6 14.0  HCT 41.5 39.4*  PLT 157 170   BMET Recent Labs    02/09/18 1634 02/11/18 0424  NA 135 136  K 3.8 3.9  CL 98* 103  CO2 29 26  GLUCOSE 415* 218*  BUN 8 10  CREATININE 0.89 0.76  CALCIUM 9.0 8.4*   PT/INR No results for input(s): LABPROT, INR in the last 72 hours. ABG No results for input(s): PHART, HCO3 in the last 72 hours.  Invalid input(s): PCO2, PO2  Studies/Results: No results found.  Anti-infectives: Anti-infectives (From admission, onward)   Start     Dose/Rate Route Frequency Ordered Stop   02/12/18 1300  ceFAZolin (ANCEF) IVPB 2g/100 mL premix     2 g 200 mL/hr over 30 Minutes Intravenous 30 min pre-op 02/11/18 1659     02/11/18 1549  ceFAZolin (ANCEF) IVPB 2g/100 mL premix  Status:  Discontinued     2 g 200 mL/hr over 30 Minutes Intravenous 30 min pre-op 02/11/18 1549 02/11/18 1659   02/10/18 0030  vancomycin (VANCOCIN) 1,500 mg in sodium chloride 0.9 % 500 mL IVPB     1,500 mg 250 mL/hr over 120 Minutes Intravenous Every 12 hours 02/09/18 2001     02/09/18 1700  piperacillin-tazobactam (ZOSYN) IVPB 3.375 g     3.375 g 12.5 mL/hr over 240  Minutes Intravenous Every 8 hours 02/09/18 1628     02/09/18 1630  vancomycin (VANCOCIN) IVPB 1000 mg/200 mL premix     1,000 mg 200 mL/hr over 60 Minutes Intravenous  Once 02/09/18 1628 02/09/18 1933      Assessment/Plan: s/p Procedure(s): AMPUTATION TOE (Left) Assessment: Stable status post amputation left second toe   Plan: Betadine and sterile gauze bandage reapplied to the left foot.  Patient awaiting vascular evaluation.  Appears to be growing strep on his culture.  Should be stable for discharge with oral Augmentin.  Patient will keep the bandage on the left foot clean, dry, and do not remove and should not need dressing changes until seen outpatient.  We will plan on seeing the patient next week for reevaluation.  LOS: 3 days    Ricci Barker 02/12/2018

## 2018-02-12 NOTE — Evaluation (Signed)
Physical Therapy Evaluation Patient Details Name: Austin [name] MRN: [id number] DOB: 04-05-1956 Today's Date: 02/12/2018   History of Present Illness  62 y/o male s/p L great toe met amputation.    Clinical Impression  Pt initially struggled with mobility and ambulation (specifically with maintaining NWBing before PWBing was approved).  He showed great effort but showed poor awareness with using walker appropriate (too far ahead, heavy postural lean making it harder on his arms, etc)  But with post-op shoe and PWBing he was much safer and more confident with in-home distances.  Pt does have assist at home and will require plenty of assist to insure he does not do too much activity.    Follow Up Recommendations Follow surgeon's recommendation for DC plan and follow-up therapies    Equipment Recommendations  (knee scooter?)    Recommendations for Other Services       Precautions / Restrictions Precautions Precautions: Fall Restrictions Weight Bearing Restrictions: Yes LLE Weight Bearing: Partial weight bearing(spoke with surgeon, okayed PWBing with post-op boot)      Mobility  Bed Mobility Overal bed mobility: Modified Independent             General bed mobility comments: Pt able to get to sitting EOB w/o assist, poor posture   Transfers Overall transfer level: Needs assistance Equipment used: Rolling walker (2 wheeled) Transfers: Sit to/from Stand Sit to Stand: Min assist         General transfer comment: Pt very heavily reliant on UEs to get to standing, light assist to insure safety and WBing compliance  Ambulation/Gait Ambulation/Gait assistance: Min assist Ambulation Distance (Feet): 40 Feet Assistive device: Rolling walker (2 wheeled)       General Gait Details: Initially pt was able to manage (barely) NWBing ambulation with slow, unsafe, poor cadence. He did much better once podiatrist was contacted and approved [place]             Wheelchair Mobility    Modified Rankin (Stroke Patients Only)       Balance Overall balance assessment: Needs assistance   Sitting balance-Leahy Scale: Fair       Standing balance-Leahy Scale: Poor                               Pertinent Vitals/Pain Pain Assessment: No/denies pain    Home Living Family/patient expects to be discharged to:: Private residence Living Arrangements: Non-relatives/Friends Available Help at Discharge: Friend(s) Type of Home: Mobile home Home Access: Ramped entrance       Home Equipment: Environmental consultant - 2 wheels;Wheelchair - manual      Prior Function Level of Independence: Independent         Comments: Pt has been recently limited secondary to L foor pain     Hand Dominance        Extremity/Trunk Assessment   Upper Extremity Assessment Upper Extremity Assessment: Generalized weakness;Overall [facility] for tasks assessed    Lower Extremity Assessment Lower Extremity Assessment: Generalized weakness;Overall WFL for tasks assessed       Communication   Communication: No difficulties  Cognition Arousal/Alertness: Awake/alert Behavior During Therapy: WFL for tasks assessed/performed Overall Cognitive Status: Within Functional Limits for tasks assessed                                        General  Comments      Exercises     Assessment/Plan    PT Assessment Patient needs continued PT services  PT Problem List Decreased strength;Decreased range of motion;Decreased activity tolerance;Decreased balance;Decreased mobility;Decreased coordination;Decreased cognition;Decreased knowledge of use of DME;Decreased safety awareness;Decreased knowledge of precautions       PT Treatment Interventions DME instruction;Stair training;Gait training;Functional mobility training;Therapeutic activities;Therapeutic exercise;Balance training;Cognitive remediation;Neuromuscular re-education;Patient/family  education;Wheelchair mobility training    PT Goals (Current goals can be found in the Care Plan section)  Acute Rehab PT Goals Patient Stated Goal: go home PT Goal Formulation: With patient Time For Goal Achievement: 02/26/18 Potential to Achieve Goals: Fair    Frequency 7X/week   Barriers to discharge        Co-evaluation               AM-PAC PT "6 Clicks" Daily Activity  Outcome Measure Difficulty turning over in bed (including adjusting bedclothes, sheets and blankets)?: A Little Difficulty moving from lying on back to sitting on the side of the bed? : A Little Difficulty sitting down on and standing up from a chair with arms (e.g., wheelchair, bedside commode, etc,.)?: Unable Help needed moving to and from a bed to chair (including a wheelchair)?: A Little Help needed walking in hospital room?: A Lot Help needed climbing 3-5 steps with a railing? : Total 6 Click Score: 13    End of Session Equipment Utilized During Treatment: Gait belt Activity Tolerance: Patient limited by fatigue Patient left: with bed alarm set;with call bell/phone within reach Nurse Communication: Mobility status PT Visit Diagnosis: Muscle weakness (generalized) (M62.81);Difficulty in walking, not elsewhere classified (R26.2)    Time: 2099-0689 PT Time Calculation (min) (ACUTE ONLY): 28 min   Charges:   PT Evaluation $PT Eval Low Complexity: 1 Low PT Treatments $Gait Training: 8-22 mins   PT G Codes:        Kreg Shropshire, DPT 02/12/2018, 5:53 PM

## 2018-02-17 LAB — AEROBIC/ANAEROBIC CULTURE (SURGICAL/DEEP WOUND)

## 2018-02-24 ENCOUNTER — Ambulatory Visit (INDEPENDENT_AMBULATORY_CARE_PROVIDER_SITE_OTHER): Payer: Medicare Other | Admitting: Vascular Surgery

## 2018-08-16 ENCOUNTER — Other Ambulatory Visit: Payer: Self-pay

## 2018-08-16 ENCOUNTER — Emergency Department: Payer: Medicare Other

## 2018-08-16 ENCOUNTER — Emergency Department
Admission: EM | Admit: 2018-08-16 | Discharge: 2018-08-17 | Disposition: A | Discharge status: Home / Self Care | Payer: Medicare Other | Attending: Emergency Medicine | Admitting: Emergency Medicine

## 2018-08-16 DIAGNOSIS — Z89412 Acquired absence of left great toe: Secondary | ICD-10-CM | POA: Diagnosis not present

## 2018-08-16 DIAGNOSIS — Y9289 Other specified places as the place of occurrence of the external cause: Secondary | ICD-10-CM | POA: Diagnosis not present

## 2018-08-16 DIAGNOSIS — E1159 Type 2 diabetes mellitus with other circulatory complications: Secondary | ICD-10-CM | POA: Diagnosis not present

## 2018-08-16 DIAGNOSIS — Z7901 Long term (current) use of anticoagulants: Secondary | ICD-10-CM | POA: Insufficient documentation

## 2018-08-16 DIAGNOSIS — X58XXXA Exposure to other specified factors, initial encounter: Secondary | ICD-10-CM | POA: Insufficient documentation

## 2018-08-16 DIAGNOSIS — S91302A Unspecified open wound, left foot, initial encounter: Secondary | ICD-10-CM | POA: Diagnosis not present

## 2018-08-16 DIAGNOSIS — Z794 Long term (current) use of insulin: Secondary | ICD-10-CM | POA: Diagnosis not present

## 2018-08-16 DIAGNOSIS — F17210 Nicotine dependence, cigarettes, uncomplicated: Secondary | ICD-10-CM | POA: Insufficient documentation

## 2018-08-16 DIAGNOSIS — M79672 Pain in left foot: Secondary | ICD-10-CM | POA: Diagnosis present

## 2018-08-16 DIAGNOSIS — I10 Essential (primary) hypertension: Secondary | ICD-10-CM | POA: Diagnosis not present

## 2018-08-16 DIAGNOSIS — Z89422 Acquired absence of other left toe(s): Secondary | ICD-10-CM | POA: Insufficient documentation

## 2018-08-16 DIAGNOSIS — Y999 Unspecified external cause status: Secondary | ICD-10-CM | POA: Insufficient documentation

## 2018-08-16 DIAGNOSIS — Y9389 Activity, other specified: Secondary | ICD-10-CM | POA: Diagnosis not present

## 2018-08-16 DIAGNOSIS — E118 Type 2 diabetes mellitus with unspecified complications: Secondary | ICD-10-CM

## 2018-08-16 LAB — COMPREHENSIVE METABOLIC PANEL
ALBUMIN: 3.9 g/dL (ref 3.5–5.0)
ALT: 20 U/L (ref 0–44)
AST: 19 U/L (ref 15–41)
Alkaline Phosphatase: 64 U/L (ref 38–126)
Anion gap: 7 (ref 5–15)
BILIRUBIN TOTAL: 0.7 mg/dL (ref 0.3–1.2)
BUN: 12 mg/dL (ref 8–23)
CO2: 31 mmol/L (ref 22–32)
Calcium: 9.5 mg/dL (ref 8.9–10.3)
Chloride: 104 mmol/L (ref 98–111)
Creatinine, Ser: 0.84 mg/dL (ref 0.61–1.24)
GLUCOSE: 148 mg/dL — AB (ref 70–99)
POTASSIUM: 4.4 mmol/L (ref 3.5–5.1)
Sodium: 142 mmol/L (ref 135–145)
TOTAL PROTEIN: 7.7 g/dL (ref 6.5–8.1)

## 2018-08-16 LAB — CBC WITH DIFFERENTIAL/PLATELET
Abs Immature Granulocytes: 0.08 10*3/uL — ABNORMAL HIGH (ref 0.00–0.07)
BASOS PCT: 0 %
Basophils Absolute: 0 10*3/uL (ref 0.0–0.1)
Eosinophils Absolute: 0.1 10*3/uL (ref 0.0–0.5)
Eosinophils Relative: 1 %
HCT: 45.3 % (ref 39.0–52.0)
HEMOGLOBIN: 15.4 g/dL (ref 13.0–17.0)
Immature Granulocytes: 1 %
Lymphocytes Relative: 30 %
Lymphs Abs: 2.9 10*3/uL (ref 0.7–4.0)
MCH: 29.8 pg (ref 26.0–34.0)
MCHC: 34 g/dL (ref 30.0–36.0)
MCV: 87.8 fL (ref 80.0–100.0)
Monocytes Absolute: 0.7 10*3/uL (ref 0.1–1.0)
Monocytes Relative: 7 %
NEUTROS ABS: 5.9 10*3/uL (ref 1.7–7.7)
NEUTROS PCT: 61 %
Platelets: 247 10*3/uL (ref 150–400)
RBC: 5.16 MIL/uL (ref 4.22–5.81)
RDW: 12.4 % (ref 11.5–15.5)
WBC: 9.7 10*3/uL (ref 4.0–10.5)
nRBC: 0 % (ref 0.0–0.2)

## 2018-08-16 MED ORDER — CEPHALEXIN 500 MG PO CAPS
500.0000 mg | ORAL_CAPSULE | Freq: Three times a day (TID) | ORAL | 0 refills | Status: DC
Start: 2018-08-16 — End: 2018-10-04

## 2018-08-16 MED ORDER — PIPERACILLIN-TAZOBACTAM 3.375 G IVPB 30 MIN
3.3750 g | Freq: Once | INTRAVENOUS | Status: AC
  Administered 2018-08-16: 3.375 g via INTRAVENOUS
  Filled 2018-08-16: qty 50

## 2018-08-16 NOTE — ED Notes (Signed)
Dressing applied l  Foot

## 2018-08-16 NOTE — ED Notes (Signed)
EMS pt from home, brought to waiting room in wheelchair, with c/o left foot pain; pt had 2 toes amputated a few months ago at [facility]: wound opened yesterday and pain/swelling has increased; area open but no drainage; warm to touch; afebrile; VS WNL;

## 2018-08-16 NOTE — ED Triage Notes (Signed)
Patient presents with "split" in the toe area of left foot. Had toes amputated in June and has very dry skin at site which has split. No sign/symptoms of injection but is causing him pain to walk.

## 2018-08-16 NOTE — ED Provider Notes (Signed)
[facility] Emergency Department Provider Note  ____________________________________________   First MD Initiated Contact with Patient 08/16/18 2146     (approximate)  I have reviewed the triage vital signs and the nursing notes.   HISTORY  Chief Complaint Foot Pain    HPI Austin [name] is a 62 y.o. male since emergency department with concerns of a split area on the left foot.  He states he had his great toe and second toe amputated in July.  He states that area had been okay but now he has a open split area that is been draining.  He denies any foul odor.  He denies fever or chills.    Past Medical History:  Diagnosis Date  . Diabetes (HCC)   . DVT (deep venous thrombosis) [facility])     Patient Active Problem List   Diagnosis Date Noted  . Gangrene of left foot (HCC) 02/09/2018  . Atherosclerosis of native arteries of extremity with intermittent claudication (HCC) 05/25/2017  . Diabetes (HCC) 05/25/2017  . Essential hypertension 05/25/2017  . Hyperlipidemia 05/25/2017    Past Surgical History:  Procedure Laterality Date  . AMPUTATION TOE Left 02/10/2018   Procedure: AMPUTATION TOE;  Surgeon: Linus Galas, DPM;  Location: [facility] ORS;  Service: Podiatry;  Laterality: Left;  . groin surgery    . LOWER EXTREMITY ANGIOGRAPHY Left 02/12/2018   Procedure: Lower Extremity Angiography;  Surgeon: Annice Needy, MD;  Location: [facility] INVASIVE CV LAB;  Service: Cardiovascular;  Laterality: Left;  . LOWER EXTREMITY INTERVENTION  02/12/2018   Procedure: LOWER EXTREMITY INTERVENTION;  Surgeon: Annice Needy, MD;  Location: [facility] INVASIVE CV LAB;  Service: Cardiovascular;;  . NECK SURGERY      Prior to Admission medications   Medication Sig Start Date End Date Taking? Authorizing Provider  acetaminophen (TYLENOL) 325 MG tablet Take 2 tablets (650 mg total) by mouth every 6 (six) hours as needed for mild pain (or Fever >/= 101). 02/12/18   Ramonita Lab, MD    amoxicillin-clavulanate (AUGMENTIN) 875-125 MG tablet Take 1 tablet by mouth every 12 (twelve) hours. 02/12/18   Ramonita Lab, MD  apixaban (ELIQUIS) 5 MG TABS tablet Take 1 tablet (5 mg total) by mouth 2 (two) times daily. 02/12/18   Ramonita Lab, MD  atorvastatin (LIPITOR) 80 MG tablet Take 1 tablet (80 mg total) by mouth daily at 6 PM. 02/12/18   Gouru, Deanna Artis, MD  cephALEXin (KEFLEX) 500 MG capsule Take 1 capsule (500 mg total) by mouth 3 (three) times daily. 08/16/18   Sherrie Mustache Roselyn Bering, PA-C  clopidogrel (PLAVIX) 75 MG tablet Take 1 tablet (75 mg total) by mouth daily. 02/12/18   Ramonita Lab, MD  docusate sodium (COLACE) 100 MG capsule Take 1 capsule (100 mg total) by mouth 2 (two) times daily. 02/12/18   Gouru, Deanna Artis, MD  insulin glargine (LANTUS) 100 UNIT/ML injection Inject 0.15 mLs (15 Units total) into the skin daily. 02/13/18   Gouru, Deanna Artis, MD  lisinopril (PRINIVIL,ZESTRIL) 10 MG tablet Take 10 mg by mouth daily.     [provider]  nicotine (NICODERM CQ - DOSED IN MG/24 HOURS) 21 mg/24hr patch Place 1 patch (21 mg total) onto the skin daily. 02/12/18   Gouru, Deanna Artis, MD  oxyCODONE (OXY IR/ROXICODONE) 5 MG immediate release tablet Take 1 tablet (5 mg total) by mouth every 4 (four) hours as needed for moderate pain or breakthrough pain. 02/12/18   Ramonita Lab, MD    Allergies Patient has no known  allergies.  Family History  Problem Relation Age of Onset  . Diabetes Mother   . Coronary artery disease Father   . Hypertension Sister   . Leukemia Brother     Social History Social History   Tobacco Use  . Smoking status: Heavy Tobacco Smoker    Packs/day: 1.00    Years: 30.00    Pack years: 30.00    Types: Cigarettes  . Smokeless tobacco: Never Used  Substance Use Topics  . Alcohol use: No  . Drug use: No    Review of Systems  Constitutional: No fever/chills Eyes: No visual changes. ENT: No sore throat. Respiratory: Denies cough Genitourinary: Negative for  dysuria. Musculoskeletal: Negative for back pain.  Positive for foot pain Skin: Negative for rash.    ____________________________________________   PHYSICAL EXAM:  VITAL SIGNS: ED Triage Vitals  Enc Vitals Group     BP 08/16/18 2127 121/81     Pulse Rate 08/16/18 2127 98     Resp 08/16/18 2127 18     Temp 08/16/18 2127 98.6 F (37 C)     Temp Source 08/16/18 2127 Oral     SpO2 08/16/18 2127 100 %     Weight 08/16/18 2129 220 lb (99.8 kg)     Height 08/16/18 2129 6' (1.829 m)     Head Circumference --      Peak Flow --      Pain Score 08/16/18 2129 8     Pain Loc --      Pain Edu? --      Excl. in GC? --     Constitutional: Alert and oriented. Well appearing and in no acute distress. Eyes: Conjunctivae are normal.  Head: Atraumatic. Nose: No congestion/rhinnorhea. Mouth/Throat: Mucous membranes are moist.   Neck:  supple no lymphadenopathy noted Cardiovascular: Normal rate, regular rhythm.  Respiratory: Normal respiratory effort.  No retractionsGU: deferred Musculoskeletal: The left foot shows evidence of an amputation of the great toe and second toe.  The area that would be adjacent to the second toe and third toe has splits and appears to be a deep opening.  The area is very tender to palpation.  Questionable crepitus distally.  No foul odor is noted at this time.  Bloody drainage is noted.   Neurologic:  Normal speech and language.  Skin:  Skin is warm, dry. No rash noted.  Positive for open wound on the foot Psychiatric: Mood and affect are normal. Speech and behavior are normal.  ____________________________________________   LABS (all labs ordered are listed, but only abnormal results are displayed)  Labs Reviewed  COMPREHENSIVE METABOLIC PANEL - Abnormal; Notable for the following components:      Result Value   Glucose, Bld 148 (*)    All other components within normal limits  CBC WITH DIFFERENTIAL/PLATELET - Abnormal; Notable for the following  components:   Abs Immature Granulocytes 0.08 (*)    All other components within normal limits   ____________________________________________   ____________________________________________  RADIOLOGY  X-ray of the left foot is negative for osteomyelitis.  ____________________________________________   PROCEDURES  Procedure(s) performed: Zosyn IV  Procedures    ____________________________________________   INITIAL IMPRESSION / ASSESSMENT AND PLAN / ED COURSE  Pertinent labs & imaging results that were available during my care of the patient were reviewed by me and considered in my medical decision making (see chart for details).   Patient is 62 year old male presents emergency department complaining of an open wound on his foot.  He had 2 toes amputated at [facility] a few months ago.  He states the area has now opened and he has pain and swelling.  States there is been some bloody drainage but nothing that smells foul.  He is concerned due to the open wound in a diabetic foot.  He denies fever chills.  On physical exam the patient appears well for his chronic issues.  The left foot does show an open wound at the area where the second toe used to be.  No redness or increased warmth is noted.  No pus or foul odor is noted.  X-ray of the left foot does not show any osteomyelitis or gas pattern.  CBC and complete metabolic panel are normal.  The patient was given Zosyn IV.  The area was cleaned and dressed by the nursing staff.  He is to follow-up with the wound center.  Or his regular doctor.  He was given a prescription for Keflex.  He is to wash the area with soap and water daily.  Return emergency department if worsening.     As part of my medical decision making, I reviewed the following data within the electronic MEDICAL RECORD NUMBER Nursing notes reviewed and incorporated, Labs reviewed at C are normal, Radiograph reviewed x-ray of the left foot is negative for osteomyelitis or  gas patterns, Notes from prior ED visits and [place] Controlled Substance Database  ____________________________________________   FINAL CLINICAL IMPRESSION(S) / ED DIAGNOSES  Final diagnoses:  Open wound of left foot, initial encounter  Diabetic foot (HCC)      NEW MEDICATIONS STARTED DURING THIS VISIT:  New Prescriptions   CEPHALEXIN (KEFLEX) 500 MG CAPSULE    Take 1 capsule (500 mg total) by mouth 3 (three) times daily.     Note:  This document was prepared using Dragon voice recognition software and may include unintentional dictation errors.    Faythe Ghee, PA-C 08/16/18 2326    Jeanmarie Plant, MD 08/16/18 [phone]

## 2018-08-16 NOTE — Discharge Instructions (Addendum)
Follow-up with your regular doctor or the wound center.  Please call them for an appointment.  Wash the area with soap and water daily.  Take the antibiotics as prescribed.  Return to the emergency department if it is worsening.

## 2018-09-15 ENCOUNTER — Other Ambulatory Visit
Admission: RE | Admit: 2018-09-15 | Discharge: 2018-09-15 | Disposition: A | Discharge status: Home / Self Care | Payer: Medicare Other | Source: Other Acute Inpatient Hospital | Attending: Physician Assistant | Admitting: Physician Assistant

## 2018-09-15 ENCOUNTER — Encounter: Payer: Medicare Other | Attending: Physician Assistant | Admitting: Physician Assistant

## 2018-09-15 DIAGNOSIS — T8131XA Disruption of external operation (surgical) wound, not elsewhere classified, initial encounter: Secondary | ICD-10-CM | POA: Diagnosis present

## 2018-09-15 DIAGNOSIS — Y835 Amputation of limb(s) as the cause of abnormal reaction of the patient, or of later complication, without mention of misadventure at the time of the procedure: Secondary | ICD-10-CM | POA: Insufficient documentation

## 2018-09-15 DIAGNOSIS — B999 Unspecified infectious disease: Secondary | ICD-10-CM | POA: Insufficient documentation

## 2018-09-15 DIAGNOSIS — E1151 Type 2 diabetes mellitus with diabetic peripheral angiopathy without gangrene: Secondary | ICD-10-CM | POA: Insufficient documentation

## 2018-09-15 DIAGNOSIS — Z89422 Acquired absence of other left toe(s): Secondary | ICD-10-CM | POA: Insufficient documentation

## 2018-09-15 DIAGNOSIS — Z87891 Personal history of nicotine dependence: Secondary | ICD-10-CM | POA: Diagnosis not present

## 2018-09-15 DIAGNOSIS — I10 Essential (primary) hypertension: Secondary | ICD-10-CM | POA: Diagnosis not present

## 2018-09-17 LAB — AEROBIC CULTURE W GRAM STAIN (SUPERFICIAL SPECIMEN)

## 2018-09-17 LAB — AEROBIC CULTURE  (SUPERFICIAL SPECIMEN)

## 2018-09-17 NOTE — Progress Notes (Signed)
Austin [name], Hardeep D. ([id number]) Visit Report for 09/15/2018 Abuse/Suicide Risk Screen Details Patient Name: Austin [name], [name] Date of Service: 09/15/2018 1:00 PM Medical Record Number: [id number] Patient Account Number: [id number] Date of Birth/Sex: 06/28/1956 [age] y.o. M) Treating RN: Arnette NorrisBiell, Kristina Primary Care Raeann Offner: Magdalene PatriciaEVELO, ADRIAN Other Clinician: Referring Abir Craine: Magdalene PatriciaEVELO, ADRIAN Treating Kalisi Bevill/Extender: STONE III, HOYT Weeks in Treatment: 0 Abuse/Suicide Risk Screen Items Answer ABUSE/SUICIDE RISK SCREEN: Has anyone close to you tried to hurt or harm you recentlyo No Do you feel uncomfortable with anyone in your familyo No Has anyone forced you do things that you didnot want to doo No Do you have any thoughts of harming yourselfo No Patient displays signs or symptoms of abuse and/or neglect. No Electronic Signature(s) Signed: 09/15/2018 5:05:35 PM By: Arnette NorrisBiell, Kristina Entered By: Arnette NorrisBiell, Kristina on 09/15/2018 13:25:58 Austin [name], Nesanel D. ([id number]) -------------------------------------------------------------------------------- Activities of Daily Living Details Patient Name: Austin [name], Travor D. Date of Service: 09/15/2018 1:00 PM Medical Record Number: [id number] Patient Account Number: [id number] Date of Birth/Sex: 11/24/1955 [age] y.o. M) Treating RN: Arnette NorrisBiell, Kristina Primary Care Tyray Proch: Magdalene PatriciaEVELO, ADRIAN Other Clinician: Referring Thorn Demas: Magdalene PatriciaEVELO, ADRIAN Treating Tamberly Pomplun/Extender: STONE III, HOYT Weeks in Treatment: 0 Activities of Daily Living Items Answer Activities of Daily Living (Please select one for each item) Drive Automobile Completely Able Take Medications Completely Able Use Telephone Completely Able Care for Appearance Completely Able Use Toilet Completely Able Bath / Shower Completely Able Dress Self Completely Able Feed Self Completely Able Walk Completely Able Get In / Out Bed Completely Able Housework Completely Able Prepare Meals Completely  Able Handle Money Completely Able Shop for Self Completely Able Electronic Signature(s) Signed: 09/15/2018 5:05:35 PM By: Arnette NorrisBiell, Kristina Entered By: Arnette NorrisBiell, Kristina on 09/15/2018 13:26:13 Austin [name], Alfio D. ([id number]) -------------------------------------------------------------------------------- Education Assessment Details Patient Name: Austin [name], [name] Date of Service: 09/15/2018 1:00 PM Medical Record Number: [id number] Patient Account Number: [id number] Date of Birth/Sex: 05/02/1956 [age] y.o. M) Treating RN: Arnette NorrisBiell, Kristina Primary Care Kebrina Friend: Magdalene PatriciaEVELO, ADRIAN Other Clinician: Referring Keyuna Cuthrell: Magdalene PatriciaEVELO, ADRIAN Treating Blakelyn Dinges/Extender: Linwood DibblesSTONE III, HOYT Weeks in Treatment: 0 Primary Learner Assessed: Patient Learning Preferences/Education Level/Primary Language Learning Preference: Explanation Highest Education Level: High School Preferred Language: English Cognitive Barrier Assessment/Beliefs Language Barrier: No Translator Needed: No Memory Deficit: No Emotional Barrier: No Cultural/Religious Beliefs Affecting Medical Care: No Physical Barrier Assessment Impaired Vision: No Impaired Hearing: No Decreased Hand dexterity: No Knowledge/Comprehension Assessment Knowledge Level: High Comprehension Level: High Ability to understand written High instructions: Ability to understand verbal High instructions: Motivation Assessment Anxiety Level: Calm Cooperation: Cooperative Education Importance: Acknowledges Need Interest in Health Problems: Asks Questions Perception: Coherent Willingness to Engage in Self- High Management Activities: Readiness to Engage in Self- High Management Activities: Electronic Signature(s) Signed: 09/15/2018 5:05:35 PM By: Arnette NorrisBiell, Kristina Entered By: Arnette NorrisBiell, Kristina on 09/15/2018 13:26:39 Austin [name], Coleston D. ([id number]) -------------------------------------------------------------------------------- Fall Risk Assessment  Details Patient Name: Austin [name], [name] Date of Service: 09/15/2018 1:00 PM Medical Record Number: [id number] Patient Account Number: [id number] Date of Birth/Sex: 11/09/1955 [age] y.o. M) Treating RN: Arnette NorrisBiell, Kristina Primary Care Indianna Boran: Magdalene PatriciaEVELO, ADRIAN Other Clinician: Referring Zeena Starkel: Magdalene PatriciaEVELO, ADRIAN Treating Bryli Mantey/Extender: Linwood DibblesSTONE III, HOYT Weeks in Treatment: 0 Fall Risk Assessment Items Have you had 2 or more falls in the last 12 monthso 0 Yes Have you had any fall that resulted in injury in the last 12 monthso 0 No FALL RISK ASSESSMENT: History of falling - immediate or within 3 months 0 No Secondary diagnosis 0 No Ambulatory aid None/bed rest/wheelchair/nurse 0 No Crutches/cane/walker 15 Yes Furniture 0 No  IV Access/Saline Lock 0 No Gait/Training Normal/bed rest/immobile 0 No Weak 0 No Impaired 20 Yes Mental Status Oriented to own ability 0 Yes Notes Uses cane due to bunion Right foot, pain from wound Left foot Electronic Signature(s) Signed: 09/15/2018 5:05:35 PM By: Arnette Norris Entered By: Arnette Norris on 09/15/2018 13:27:30 Austin [name] ([id number]) -------------------------------------------------------------------------------- Foot Assessment Details Patient Name: Austin Evener D. Date of Service: 09/15/2018 1:00 PM Medical Record Number: [id number] Patient Account Number: [id number] Date of Birth/Sex: 27-Mar-1956 (62 y.o. M) Treating RN: Arnette Norris Primary Care Catie Chiao: Magdalene Patricia Other Clinician: Referring Joal Eakle: Magdalene Patricia Treating Derrick Orris/Extender: STONE III, HOYT Weeks in Treatment: 0 Foot Assessment Items Site Locations + = Sensation present, - = Sensation absent, C = Callus, U = Ulcer R = Redness, W = Warmth, M = Maceration, PU = Pre-ulcerative lesion F = Fissure, S = Swelling, D = Dryness Assessment Right: Left: Other Deformity: No No Prior Foot Ulcer: No No Prior Amputation: No No Charcot Joint: No  No Ambulatory Status: Ambulatory With Help Assistance Device: Cane Gait: Academic librarian Signature(s) Signed: 09/15/2018 5:05:35 PM By: Arnette Norris Entered By: Arnette Norris on 09/15/2018 13:31:09 Austin [name] ([id number]) -------------------------------------------------------------------------------- Nutrition Risk Assessment Details Patient Name: Austin Evener D. Date of Service: 09/15/2018 1:00 PM Medical Record Number: [id number] Patient Account Number: [id number] Date of Birth/Sex: 02-25-1956 (62 y.o. M) Treating RN: Arnette Norris Primary Care Aarilyn Dye: Magdalene Patricia Other Clinician: Referring Jaymon Dudek: Magdalene Patricia Treating Webster Patrone/Extender: STONE III, HOYT Weeks in Treatment: 0 Height (in): 76 Weight (lbs): 200 Body Mass Index (BMI): 24.3 Nutrition Risk Assessment Items NUTRITION RISK SCREEN: I have an illness or condition that made me change the kind and/or amount of 0 No food I eat I eat fewer than two meals per day 0 No I eat few fruits and vegetables, or milk products 0 No I have three or more drinks of beer, liquor or wine almost every day 0 No I have tooth or mouth problems that make it hard for me to eat 0 No I don't always have enough money to buy the food I need 0 No I eat alone most of the time 1 Yes I take three or more different prescribed or over-the-counter drugs a day 1 Yes Without wanting to, I have lost or gained 10 pounds in the last six months 0 No I am not always physically able to shop, cook and/or feed myself 0 No Nutrition Protocols Good Risk Protocol Moderate Risk Protocol Electronic Signature(s) Signed: 09/15/2018 5:05:35 PM By: Arnette Norris Entered By: Arnette Norris on 09/15/2018 13:29:02

## 2018-09-20 NOTE — Progress Notes (Signed)
Austin [name], Jarrel D. ([id number]) Visit Report for 09/15/2018 Chief Complaint Document Details Patient Name: Austin [name], Austin D. Date of Service: 09/15/2018 1:00 PM Medical Record Number: [id number] Patient Account Number: [id number] Date of Birth/Sex: 06/26/1956 [age] y.o. M) Treating RN: Curtis Sitesorthy, Joanna Primary Care Provider: Magdalene PatriciaEVELO, ADRIAN Other Clinician: Referring Provider: Magdalene PatriciaEVELO, ADRIAN Treating Provider/Extender: Linwood DibblesSTONE III, HOYT Weeks in Treatment: 0 Information Obtained from: Patient Chief Complaint Surgical site ulcer left 1st and 2nd toes Electronic Signature(s) Signed: 09/18/2018 8:01:46 PM By: Lenda KelpStone III, Hoyt PA-C Entered By: Lenda KelpStone III, Hoyt on 09/15/2018 14:10:52 Austin [name], Brysen D. ([id number]) -------------------------------------------------------------------------------- HPI Details Patient Name: Austin [name], Austin D. Date of Service: 09/15/2018 1:00 PM Medical Record Number: [id number] Patient Account Number: [id number] Date of Birth/Sex: 01/22/1956 [age] y.o. M) Treating RN: Curtis Sitesorthy, Joanna Primary Care Provider: Magdalene PatriciaEVELO, ADRIAN Other Clinician: Referring Provider: Magdalene PatriciaEVELO, ADRIAN Treating Provider/Extender: Linwood DibblesSTONE III, HOYT Weeks in Treatment: 0 History of Present Illness HPI Description: 09/15/18 patient presents today for initial evaluation and clinic concerning a wound to the left foot at the location of the second toe. This is an amputation site. 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 Currently he tells me that the area is exquisitely tender unfortunately. There was an x-ray that was performed on 08/16/18 which revealed at that time no osseous erosions to suggest osteomyelitis. Subsequently patient has not had any further imaging such as in [place] since that point. He did have intervention by Dr. dew on 02/12/18 which was an angiogram. 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 Subsequently postintervention this was diminished around 30% in some areas even down to 20% were stands for utilize. Overall I do believe this was beneficial for the patient. The patient [facility] due to the ongoing issues at this invitation site. He has not seen his podiatrist recently. I think that maybe something that we need to consider is doing the overall appearance of the wound currently. Electronic Signature(s) Signed: 09/18/2018 8:01:46 PM By: Lenda KelpStone III, Hoyt PA-C Entered By: Lenda KelpStone III, Hoyt on 09/18/2018 19:23:02 Austin [name], Austin D. ([id number]) -------------------------------------------------------------------------------- Physical Exam Details Patient Name: Austin [name], Austin D. Date of Service: 09/15/2018 1:00 PM Medical Record Number: [id number] Patient Account Number: [id number] Date of Birth/Sex: 10/09/1955 3(62 y.o. M) Treating RN: Curtis Sitesorthy, Joanna Primary Care Provider: Magdalene PatriciaEVELO, ADRIAN Other Clinician: Referring Provider: Magdalene PatriciaEVELO, ADRIAN Treating Provider/Extender: STONE III, HOYT Weeks in Treatment: 0 Constitutional sitting or standing blood pressure is within target range for patient.. pulse regular and within target range for patient.Marland Kitchen. respirations regular, non-labored and within target range for patient.Marland Kitchen. temperature within target range for patient.. Well- nourished and well-hydrated in no acute distress. Eyes conjunctiva clear no eyelid edema noted. pupils equal round and reactive to light and accommodation. Ears, Nose, Mouth, and Throat no gross abnormality of ear auricles or external auditory canals. normal hearing noted during conversation. mucus membranes moist. Respiratory normal breathing without difficulty. clear to auscultation bilaterally. Cardiovascular regular rate and rhythm with normal S1, S2. no clubbing, cyanosis, significant edema, <3 sec cap  refill. Gastrointestinal (GI) soft, non-tender, non-distended, +BS. no ventral hernia noted. Musculoskeletal normal gait and posture. Left foot toe amputations. Psychiatric this patient is able to make decisions and demonstrates good insight into disease process. Alert and Oriented x 3. pleasant and cooperative. Notes Upon inspection today patient's wound bed shows evidence  of callous and necrotic material in the surface and base of the wound. I did gently probe the area of his wound which revealed that the patient does have quite a bit of depth to this area is also exquisitely tender. For this reason I do not think we're gonna be able to attempt any type of debridement at this point. Electronic Signature(s) Signed: 09/18/2018 8:01:46 PM By: Lenda Kelp PA-C Entered By: Lenda Kelp on 09/18/2018 19:24:19 Austin [name] ([id number]) -------------------------------------------------------------------------------- Physician Orders Details Patient Name: Austin Evener D. Date of Service: 09/15/2018 1:00 PM Medical Record Number: [id number] Patient Account Number: [id number] Date of Birth/Sex: [birth year] (62 y.o. M) Treating RN: Curtis Sites Primary Care Provider: Magdalene Patricia Other Clinician: Referring Provider: Magdalene Patricia Treating Provider/Extender: Linwood [name], HOYT Weeks in Treatment: 0 Verbal / Phone Orders: No Diagnosis Coding ICD-10 Coding Code Description E11.621 Type 2 diabetes mellitus with foot ulcer T81.31XA Disruption of external operation (surgical) wound, not elsewhere classified, initial encounter L97.522 Non-pressure chronic ulcer of other part of left foot with fat layer exposed I73.89 Other specified peripheral vascular diseases Z89.422 Acquired absence of other left toe(s) Wound Cleansing Wound #1 Left Amputation Site - Toe o Clean wound with Normal Saline. o May shower with protection. Anesthetic (add to Medication List) Wound #1 Left  Amputation Site - Toe o Topical Lidocaine 4% cream applied to wound bed prior to debridement (In Clinic Only). Primary Wound Dressing Wound #1 Left Amputation Site - Toe o Silver Alginate Secondary Dressing Wound #1 Left Amputation Site - Toe o Boardered Foam Dressing Dressing Change Frequency Wound #1 Left Amputation Site - Toe o Change Dressing Monday, Wednesday, Friday Follow-up Appointments Wound #1 Left Amputation Site - Toe o Return Appointment in 1 week. Home Health Wound #1 Left Amputation Site - Toe o Initiate Home Health for Skilled Nursing o Home Health Nurse may visit PRN to address patientos wound care needs. o FACE TO FACE ENCOUNTER: MEDICARE and MEDICAID PATIENTS: I certify that this patient is under my care and that I had a face-to-face encounter that meets the physician face-to-face encounter requirements with this patient on this date. The encounter with the patient was in whole or in part for the following MEDICAL CONDITION: (primary reason for Home Healthcare) MEDICAL NECESSITY: I certify, that based on my findings, BRODYN, DEPUY ([id number]) NURSING services are a medically necessary home health service. HOME BOUND STATUS: I certify that my clinical findings support that this patient is homebound (i.e., Due to illness or injury, pt requires aid of supportive devices such as crutches, cane, wheelchairs, walkers, the use of special transportation or the assistance of another person to leave their place of residence. There is a normal inability to leave the home and doing so requires considerable and taxing effort. Other absences are for medical reasons / religious services and are infrequent or of short duration when for other reasons). o If current dressing causes regression in wound condition, may D/C ordered dressing product/s and apply Normal Saline Moist Dressing daily until next Wound Healing Center / Other MD appointment. Notify  Wound Healing Center of regression in wound condition at [phone]. o Please direct any NON-WOUND related issues/requests for orders to patient's Primary Care Physician Consults o Podiatry Patient Medications Allergies: No Known Drug Allergies Notifications Medication Indication Start End Bactrim DS 09/15/2018 DOSE 1 - oral 800 mg-160 mg tablet - 1 tablet oral taken 2 times a day for 14 days Electronic Signature(s) Signed: 09/18/2018 8:01:46 PM By: Larina Bras  III, Hoyt PA-C Entered By: Lenda Kelp on 09/15/2018 14:27:21 GERHARD, RAPPAPORT ([id number]) -------------------------------------------------------------------------------- Prescription 09/15/2018 Patient Name: Austin Evener D. Provider: Lenda Kelp PA-C Date of Birth: 09-Jan-1956 NPI#: [id number] Sex: Judie Petit DEA#: [id number] Phone #: [phone] License #: Patient Address: [facility] [address] [facility] [place], [place] [address] [address], [address] [place], [place] [address] [phone] Allergies No Known Drug Allergies Medication Medication: Route: Strength: Form: Bactrim DS oral 800 mg-160 mg tablet Class: ABSORBABLE SULFONAMIDE ANTIBACTERIAL AGENTS Dose: Frequency / Time: Indication: 1 1 tablet oral taken 2 times a day for 14 days Number of Refills: Number of Units: 0 Twenty Eight (28) Tablet(s) Generic Substitution: Start Date: End Date: Administered at Substitution Permitted 09/15/2018 Facility: No Note to Pharmacy: Signature(s): Date(s): Electronic Signature(s) Signed: 09/18/2018 8:01:46 PM By: Lenda Kelp PA-C Entered By: Lenda Kelp on 09/15/2018 14:27:21 Austin [name] ([id number]) --------------------------------------------------------------------------------  Problem List Details Patient Name: Austin Evener D. Date of Service: 09/15/2018 1:00 PM Medical Record Number: [id number] Patient Account  Number: [id number] Date of Birth/Sex: Aug 14, 1956 (62 y.o. M) Treating RN: Curtis Sites Primary Care Provider: Magdalene Patricia Other Clinician: Referring Provider: Magdalene Patricia Treating Provider/Extender: Linwood [name], HOYT Weeks in Treatment: 0 Active Problems ICD-10 Evaluated Encounter Code Description Active Date Today Diagnosis E11.621 Type 2 diabetes mellitus with foot ulcer 09/15/2018 No Yes T81.31XA Disruption of external operation (surgical) wound, not 09/15/2018 No Yes elsewhere classified, initial encounter L97.522 Non-pressure chronic ulcer of other part of left foot with fat 09/15/2018 No Yes layer exposed I73.89 Other specified peripheral vascular diseases 09/15/2018 No Yes Z89.422 Acquired absence of other left toe(s) 09/15/2018 No Yes Inactive Problems Resolved Problems Electronic Signature(s) Signed: 09/18/2018 8:01:46 PM By: Lenda Kelp PA-C Entered By: Lenda Kelp on 09/15/2018 14:10:31 Austin [name] ([id number]) -------------------------------------------------------------------------------- Progress Note Details Patient Name: Austin Evener D. Date of Service: 09/15/2018 1:00 PM Medical Record Number: [id number] Patient Account Number: [id number] Date of Birth/Sex: Dec 12, 1955 (62 y.o. M) Treating RN: Curtis Sites Primary Care Provider: Magdalene Patricia Other Clinician: Referring Provider: Magdalene Patricia Treating Provider/Extender: Linwood [name], HOYT Weeks in Treatment: 0 Subjective Chief Complaint Information obtained from Patient Surgical site ulcer left 1st and 2nd toes History of Present Illness (HPI) 09/15/18 patient presents today for initial evaluation and clinic concerning a wound to the left foot at the location of the second toe. This is an amputation site. 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 Currently he tells me that the area is exquisitely tender  unfortunately. There was an x-ray that was performed on 08/16/18 which revealed at that time no osseous erosions to suggest osteomyelitis. Subsequently patient has not had any further imaging such as in [place] since that point. He did have intervention by Dr. dew on 02/12/18 which was an angiogram. It appeared at that time but the patient had significant stenosis 70 to 80% in the left lower extremity. Subsequently postintervention this was diminished around 30% in some areas even down to 20% were stands for utilize. Overall I do believe this was beneficial for the patient. The patient was referred to [place] by the [facility] due to the ongoing issues at this invitation site. He has not seen his podiatrist recently. I think that maybe something that we need to consider is doing the overall appearance of the wound currently. Wound History  Patient presents with 1 open wound that has been present for approximately 5 months. Patient has been treating wound in the following manner: bandage. Laboratory tests have not been performed in the last month. Patient reportedly has not tested positive for an antibiotic resistant organism. Patient reportedly has not tested positive for osteomyelitis. Patient reportedly has not had testing performed to evaluate circulation in the legs. Patient experiences the following problems associated with their wounds: infection, swelling. Patient History Information obtained from Patient. Allergies No Known Drug Allergies Family History Diabetes - Mother, Heart Disease - Father, Hypertension - Siblings, No family history of Hereditary Spherocytosis, Kidney Disease, Lung Disease, Seizures, Stroke, Thyroid Problems, Tuberculosis. Social History Former smoker - ended on 09/12/2018, Alcohol Use - Never, Drug Use - No History, Caffeine Use - Daily - soda. Medical History Eyes Denies history of Cataracts, Glaucoma, Optic Neuritis Ear/Nose/Mouth/Throat Denies  history of Chronic sinus problems/congestion, Middle ear problems Hematologic/Lymphatic Denies history of Anemia, Hemophilia, Human Immunodeficiency Virus, Lymphedema, Sickle Cell Disease Kretzschmar, Jermario D. ([id number]) Respiratory Denies history of Aspiration, Asthma, Chronic Obstructive Pulmonary Disease (COPD), Pneumothorax, Sleep Apnea, Tuberculosis Cardiovascular Patient has history of Hypertension Gastrointestinal Denies history of Cirrhosis , Colitis, Crohn s, Hepatitis A, Hepatitis B, Hepatitis C Endocrine Patient has history of Type II Diabetes Denies history of Type I Diabetes Genitourinary Denies history of End Stage Renal Disease Immunological Denies history of Lupus Erythematosus, Raynaud s, Scleroderma Integumentary (Skin) Denies history of History of Burn, History of pressure wounds Musculoskeletal Denies history of Gout, Rheumatoid Arthritis, Osteoarthritis, Osteomyelitis Patient is treated with Oral Agents. Medical And Surgical History Notes Cardiovascular Atherosclerosis Review of Systems (ROS) Eyes The patient has no complaints or symptoms. Ear/Nose/Mouth/Throat The patient has no complaints or symptoms. Hematologic/Lymphatic The patient has no complaints or symptoms. Respiratory The patient has no complaints or symptoms. Cardiovascular The patient has no complaints or symptoms. Gastrointestinal The patient has no complaints or symptoms. Endocrine The patient has no complaints or symptoms. Genitourinary The patient has no complaints or symptoms. Immunological The patient has no complaints or symptoms. Integumentary (Skin) Complains or has symptoms of Wounds - amputation site, Swelling. Musculoskeletal Denies complaints or symptoms of Muscle Pain, Muscle Weakness. Neurologic The patient has no complaints or symptoms. Oncologic The patient has no complaints or symptoms. Psychiatric The patient has no complaints or symptoms. LUKEN, SHADOWENS D.  ([id number]) Objective Constitutional sitting or standing blood pressure is within target range for patient.. pulse regular and within target range for patient.Marland Kitchen respirations regular, non-labored and within target range for patient.Marland Kitchen temperature within target range for patient.. Well- nourished and well-hydrated in no acute distress. Vitals Time Taken: 1:10 PM, Height: 76 in, Source: Stated, Weight: 200 lbs, Source: Stated, BMI: 24.3, Temperature: 97.7 F, Pulse: 80 bpm, Respiratory Rate: 18 breaths/min, Blood Pressure: 128/66 mmHg. Eyes conjunctiva clear no eyelid edema noted. pupils equal round and reactive to light and accommodation. Ears, Nose, Mouth, and Throat no gross abnormality of ear auricles or external auditory canals. normal hearing noted during conversation. mucus membranes moist. Respiratory normal breathing without difficulty. clear to auscultation bilaterally. Cardiovascular regular rate and rhythm with normal S1, S2. no clubbing, cyanosis, significant edema, Gastrointestinal (GI) soft, non-tender, non-distended, +BS. no ventral hernia noted. Musculoskeletal normal gait and posture. Left foot toe amputations. Psychiatric this patient is able to make decisions and demonstrates good insight into disease process. Alert and Oriented x 3. pleasant and cooperative. General Notes: Upon inspection today patient's wound bed shows evidence of callous and necrotic material in the  surface and base of the wound. I did gently probe the area of his wound which revealed that the patient does have quite a bit of depth to this area is also exquisitely tender. For this reason I do not think we're gonna be able to attempt any type of debridement at this point. Integumentary (Hair, Skin) Wound #1 status is Open. Original cause of wound was Surgical Injury. The wound is located on the Left Amputation Site - Toe. The wound measures 1.5cm length x 0.2cm width x 0.9cm depth; 0.236cm^2 area and  0.212cm^3 volume. There is Fat Layer (Subcutaneous Tissue) Exposed exposed. There is no tunneling or undermining noted. There is a medium amount of serous drainage noted. Foul odor after cleansing was noted. The wound margin is epibole. There is no granulation within the wound bed. There is no necrotic tissue within the wound bed. The periwound skin appearance exhibited: Scarring, Maceration, Hemosiderin Staining. The periwound skin appearance did not exhibit: Callus, Crepitus, Excoriation, Induration, Rash, Dry/Scaly, Atrophie Blanche, Cyanosis, Ecchymosis, Mottled, Pallor, Rubor, Erythema. Periwound temperature was noted as No Abnormality. The periwound has tenderness on palpation. Assessment HASSON, GASPARD ([id number]) Active Problems ICD-10 Type 2 diabetes mellitus with foot ulcer Disruption of external operation (surgical) wound, not elsewhere classified, initial encounter Non-pressure chronic ulcer of other part of left foot with fat layer exposed Other specified peripheral vascular diseases Acquired absence of other left toe(s) Plan Wound Cleansing: Wound #1 Left Amputation Site - Toe: Clean wound with Normal Saline. May shower with protection. Anesthetic (add to Medication List): Wound #1 Left Amputation Site - Toe: Topical Lidocaine 4% cream applied to wound bed prior to debridement (In Clinic Only). Primary Wound Dressing: Wound #1 Left Amputation Site - Toe: Silver Alginate Secondary Dressing: Wound #1 Left Amputation Site - Toe: Boardered Foam Dressing Dressing Change Frequency: Wound #1 Left Amputation Site - Toe: Change Dressing Monday, Wednesday, Friday Follow-up Appointments: Wound #1 Left Amputation Site - Toe: Return Appointment in 1 week. Home Health: Wound #1 Left Amputation Site - Toe: Initiate Home Health for Skilled Nursing Home Health Nurse may visit PRN to address patient s wound care needs. FACE TO FACE ENCOUNTER: MEDICARE and MEDICAID PATIENTS: I  certify that this patient is under my care and that I had a face-to-face encounter that meets the physician face-to-face encounter requirements with this patient on this date. The encounter with the patient was in whole or in part for the following MEDICAL CONDITION: (primary reason for Home Healthcare) MEDICAL NECESSITY: I certify, that based on my findings, NURSING services are a medically necessary home health service. HOME BOUND STATUS: I certify that my clinical findings support that this patient is homebound (i.e., Due to illness or injury, pt requires aid of supportive devices such as crutches, cane, wheelchairs, walkers, the use of special transportation or the assistance of another person to leave their place of residence. There is a normal inability to leave the home and doing so requires considerable and taxing effort. Other absences are for medical reasons / religious services and are infrequent or of short duration when for other reasons). If current dressing causes regression in wound condition, may D/C ordered dressing product/s and apply Normal Saline Moist Dressing daily until next Wound Healing Center / Other MD appointment. Notify Wound Healing Center of regression in wound condition at [phone]. Please direct any NON-WOUND related issues/requests for orders to patient's Primary Care Physician Consults ordered were: Podiatry The following medication(s) was prescribed: Bactrim DS oral 800 mg-160 mg  tablet 1 1 tablet oral taken 2 times a day for 14 days starting 09/15/2018 MILUS, FRITZE ([id number]) My suggestion currently was that we may want to get the patient into be seen by his podiatrist to see if there's anything from a surgical debridement standpoint that may be required. I'm not sure that this is going to heal very well short of intervention.. The patient is in agreement with this plan. We will see about making that referral for him. In the meantime we will  initiate the above wound care measures. Please see above for specific wound care orders. We will see patient for re-evaluation in 1 week(s) here in the clinic. If anything worsens or changes patient will contact our office for additional recommendations. Electronic Signature(s) Signed: 09/18/2018 8:01:46 PM By: Lenda Kelp PA-C Entered By: Lenda Kelp on 09/18/2018 19:26:15 Austin [name] ([id number]) -------------------------------------------------------------------------------- ROS/PFSH Details Patient Name: Austin Evener D. Date of Service: 09/15/2018 1:00 PM Medical Record Number: [id number] Patient Account Number: [id number] Date of Birth/Sex: [birth year] (62 y.o. M) Treating RN: Arnette Norris Primary Care Provider: Magdalene Patricia Other Clinician: Referring Provider: Magdalene Patricia Treating Provider/Extender: STONE III, HOYT Weeks in Treatment: 0 Information Obtained From Patient Wound History Do you currently have one or more open woundso Yes How many open wounds do you currently haveo 1 Approximately how long have you had your woundso 5 months How have you been treating your wound(s) until nowo bandage Has your wound(s) ever healed and then re-openedo No Have you had any lab work done in the past montho No Have you tested positive for an antibiotic resistant organism (MRSA, VRE)o No Have you tested positive for osteomyelitis (bone infection)o No Have you had any tests for circulation on your legso No Have you had other problems associated with your woundso Infection, Swelling Integumentary (Skin) Complaints and Symptoms: Positive for: Wounds - amputation site; Swelling Medical History: Negative for: History of Burn; History of pressure wounds Musculoskeletal Complaints and Symptoms: Negative for: Muscle Pain; Muscle Weakness Medical History: Negative for: Gout; Rheumatoid Arthritis; Osteoarthritis; Osteomyelitis Eyes Complaints and Symptoms: No  Complaints or Symptoms Medical History: Negative for: Cataracts; Glaucoma; Optic Neuritis Ear/Nose/Mouth/Throat Complaints and Symptoms: No Complaints or Symptoms Medical History: Negative for: Chronic sinus problems/congestion; Middle ear problems Hematologic/Lymphatic ISIAAH, CUERVO. ([id number]) Complaints and Symptoms: No Complaints or Symptoms Medical History: Negative for: Anemia; Hemophilia; Human Immunodeficiency Virus; Lymphedema; Sickle Cell Disease Respiratory Complaints and Symptoms: No Complaints or Symptoms Medical History: Negative for: Aspiration; Asthma; Chronic Obstructive Pulmonary Disease (COPD); Pneumothorax; Sleep Apnea; Tuberculosis Cardiovascular Complaints and Symptoms: No Complaints or Symptoms Medical History: Positive for: Hypertension Past Medical History Notes: Atherosclerosis Gastrointestinal Complaints and Symptoms: No Complaints or Symptoms Medical History: Negative for: Cirrhosis ; Colitis; Crohnos; Hepatitis A; Hepatitis B; Hepatitis C Endocrine Complaints and Symptoms: No Complaints or Symptoms Medical History: Positive for: Type II Diabetes Negative for: Type I Diabetes Treated with: Oral agents Genitourinary Complaints and Symptoms: No Complaints or Symptoms Medical History: Negative for: End Stage Renal Disease Immunological Complaints and Symptoms: No Complaints or Symptoms Medical History: Negative for: Lupus Erythematosus; Raynaudos; Scleroderma GAHEL, SAFLEY ([id number]) Neurologic Complaints and Symptoms: No Complaints or Symptoms Oncologic Complaints and Symptoms: No Complaints or Symptoms Psychiatric Complaints and Symptoms: No Complaints or Symptoms Immunizations Pneumococcal Vaccine: Received Pneumococcal Vaccination: Yes Implantable Devices Family and Social History Diabetes: Yes - Mother; Heart Disease: Yes - Father; Hereditary Spherocytosis: No; Hypertension: Yes - Siblings; Kidney Disease: No;  Lung Disease: No; Seizures: No;  Stroke: No; Thyroid Problems: No; Tuberculosis: No; Former smoker - ended on 09/12/2018; Alcohol Use: Never; Drug Use: No History; Caffeine Use: Daily - soda; Financial Concerns: No; Food, Clothing or Shelter Needs: No; Support System Lacking: No; Transportation Concerns: No; Advanced Directives: No; Living Will: No Electronic Signature(s) Signed: 09/15/2018 5:05:35 PM By: Arnette Norris Signed: 09/18/2018 8:01:46 PM By: Lenda Kelp PA-C Entered By: Arnette Norris on 09/15/2018 13:25:38 Austin [name] ([id number]) -------------------------------------------------------------------------------- SuperBill Details Patient Name: Austin Evener D. Date of Service: 09/15/2018 Medical Record Number: [id number] Patient Account Number: [id number] Date of Birth/Sex: Aug 30, 1956 (62 y.o. 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) Facility Procedures CPT4 Code: 13086578 Description: 99214 - WOUND CARE VISIT-LEV 4 EST PT Modifier: Quantity: 1 Physician Procedures CPT4: Description Modifier Quantity Code 4696295 99204 - WC PHYS LEVEL 4 - NEW PT 1 ICD-10 Diagnosis Description E11.621 Type 2 diabetes mellitus with foot ulcer T81.31XA Disruption of external operation (surgical) wound, not elsewhere classified,  initial encounter L97.522 Non-pressure chronic ulcer of other part of left foot with fat layer exposed I73.89 Other specified peripheral  vascular diseases Electronic Signature(s) Signed: 09/18/2018 8:01:46 PM By: Lenda Kelp PA-C Entered By: Lenda Kelp on 09/16/2018 04:58:54

## 2018-09-22 ENCOUNTER — Ambulatory Visit: Payer: Medicare Other | Admitting: Physician Assistant

## 2018-09-29 ENCOUNTER — Inpatient Hospital Stay
Admission: AD | Admit: 2018-09-29 | Discharge: 2018-10-04 | DRG: 253 | Disposition: A | Discharge status: Home Health | Payer: Medicare Other | Source: Ambulatory Visit | Attending: Internal Medicine | Admitting: Internal Medicine

## 2018-09-29 ENCOUNTER — Other Ambulatory Visit: Payer: Self-pay

## 2018-09-29 DIAGNOSIS — E1152 Type 2 diabetes mellitus with diabetic peripheral angiopathy with gangrene: Principal | ICD-10-CM | POA: Diagnosis present

## 2018-09-29 DIAGNOSIS — Z794 Long term (current) use of insulin: Secondary | ICD-10-CM | POA: Diagnosis not present

## 2018-09-29 DIAGNOSIS — L97509 Non-pressure chronic ulcer of other part of unspecified foot with unspecified severity: Secondary | ICD-10-CM | POA: Diagnosis present

## 2018-09-29 DIAGNOSIS — Z86718 Personal history of other venous thrombosis and embolism: Secondary | ICD-10-CM

## 2018-09-29 DIAGNOSIS — L03116 Cellulitis of left lower limb: Secondary | ICD-10-CM | POA: Diagnosis present

## 2018-09-29 DIAGNOSIS — I70269 Atherosclerosis of native arteries of extremities with gangrene, unspecified extremity: Secondary | ICD-10-CM

## 2018-09-29 DIAGNOSIS — Z89422 Acquired absence of other left toe(s): Secondary | ICD-10-CM | POA: Diagnosis not present

## 2018-09-29 DIAGNOSIS — L97519 Non-pressure chronic ulcer of other part of right foot with unspecified severity: Secondary | ICD-10-CM | POA: Diagnosis present

## 2018-09-29 DIAGNOSIS — E440 Moderate protein-calorie malnutrition: Secondary | ICD-10-CM | POA: Diagnosis present

## 2018-09-29 DIAGNOSIS — Z79899 Other long term (current) drug therapy: Secondary | ICD-10-CM | POA: Diagnosis not present

## 2018-09-29 DIAGNOSIS — Z79891 Long term (current) use of opiate analgesic: Secondary | ICD-10-CM | POA: Diagnosis not present

## 2018-09-29 DIAGNOSIS — I70245 Atherosclerosis of native arteries of left leg with ulceration of other part of foot: Secondary | ICD-10-CM | POA: Diagnosis not present

## 2018-09-29 DIAGNOSIS — Z806 Family history of leukemia: Secondary | ICD-10-CM | POA: Diagnosis not present

## 2018-09-29 DIAGNOSIS — I70202 Unspecified atherosclerosis of native arteries of extremities, left leg: Secondary | ICD-10-CM | POA: Diagnosis present

## 2018-09-29 DIAGNOSIS — Z7902 Long term (current) use of antithrombotics/antiplatelets: Secondary | ICD-10-CM | POA: Diagnosis not present

## 2018-09-29 DIAGNOSIS — I708 Atherosclerosis of other arteries: Secondary | ICD-10-CM | POA: Diagnosis present

## 2018-09-29 DIAGNOSIS — L97529 Non-pressure chronic ulcer of other part of left foot with unspecified severity: Secondary | ICD-10-CM | POA: Diagnosis present

## 2018-09-29 DIAGNOSIS — Z8249 Family history of ischemic heart disease and other diseases of the circulatory system: Secondary | ICD-10-CM | POA: Diagnosis not present

## 2018-09-29 DIAGNOSIS — Z7901 Long term (current) use of anticoagulants: Secondary | ICD-10-CM

## 2018-09-29 DIAGNOSIS — Z6822 Body mass index (BMI) 22.0-22.9, adult: Secondary | ICD-10-CM | POA: Diagnosis not present

## 2018-09-29 DIAGNOSIS — E1140 Type 2 diabetes mellitus with diabetic neuropathy, unspecified: Secondary | ICD-10-CM | POA: Diagnosis present

## 2018-09-29 DIAGNOSIS — L03115 Cellulitis of right lower limb: Secondary | ICD-10-CM | POA: Diagnosis present

## 2018-09-29 DIAGNOSIS — F17210 Nicotine dependence, cigarettes, uncomplicated: Secondary | ICD-10-CM | POA: Diagnosis present

## 2018-09-29 DIAGNOSIS — Z9119 Patient's noncompliance with other medical treatment and regimen: Secondary | ICD-10-CM

## 2018-09-29 DIAGNOSIS — E11621 Type 2 diabetes mellitus with foot ulcer: Secondary | ICD-10-CM | POA: Diagnosis present

## 2018-09-29 DIAGNOSIS — F17200 Nicotine dependence, unspecified, uncomplicated: Secondary | ICD-10-CM | POA: Diagnosis not present

## 2018-09-29 DIAGNOSIS — I70262 Atherosclerosis of native arteries of extremities with gangrene, left leg: Secondary | ICD-10-CM | POA: Diagnosis present

## 2018-09-29 DIAGNOSIS — T82856A Stenosis of peripheral vascular stent, initial encounter: Secondary | ICD-10-CM | POA: Diagnosis present

## 2018-09-29 DIAGNOSIS — Z833 Family history of diabetes mellitus: Secondary | ICD-10-CM

## 2018-09-29 DIAGNOSIS — L97528 Non-pressure chronic ulcer of other part of left foot with other specified severity: Secondary | ICD-10-CM | POA: Diagnosis not present

## 2018-09-29 LAB — BASIC METABOLIC PANEL
ANION GAP: 8 (ref 5–15)
BUN: 12 mg/dL (ref 8–23)
CO2: 22 mmol/L (ref 22–32)
Calcium: 8.8 mg/dL — ABNORMAL LOW (ref 8.9–10.3)
Chloride: 105 mmol/L (ref 98–111)
Creatinine, Ser: 0.85 mg/dL (ref 0.61–1.24)
GFR calc Af Amer: >60 mL/min (ref >60)
GFR calc non Af Amer: >60 mL/min (ref >60)
Glucose, Bld: 197 mg/dL — ABNORMAL HIGH (ref 70–99)
Potassium: 3.8 mmol/L (ref 3.5–5.1)
Sodium: 135 mmol/L (ref 135–145)

## 2018-09-29 LAB — SURGICAL PCR SCREEN
MRSA, PCR: NEGATIVE
Staphylococcus aureus: NEGATIVE

## 2018-09-29 LAB — GLUCOSE, CAPILLARY
GLUCOSE-CAPILLARY: 189 mg/dL — AB (ref 70–99)
Glucose-Capillary: 130 mg/dL — ABNORMAL HIGH (ref 70–99)

## 2018-09-29 MED ORDER — SODIUM CHLORIDE 0.9% FLUSH
3.0000 mL | Freq: Two times a day (BID) | INTRAVENOUS | Status: DC
  Administered 2018-09-29 – 2018-10-04 (×6): 3 mL via INTRAVENOUS

## 2018-09-29 MED ORDER — INSULIN ASPART 100 UNIT/ML ~~LOC~~ SOLN
0.0000 [IU] | Freq: Three times a day (TID) | SUBCUTANEOUS | Status: DC
  Administered 2018-09-29: 2 [IU] via SUBCUTANEOUS
  Administered 2018-09-30 (×2): 3 [IU] via SUBCUTANEOUS
  Administered 2018-10-01 – 2018-10-02 (×3): 2 [IU] via SUBCUTANEOUS
  Filled 2018-09-29 (×5): qty 1

## 2018-09-29 MED ORDER — DOCUSATE SODIUM 100 MG PO CAPS
100.0000 mg | ORAL_CAPSULE | Freq: Two times a day (BID) | ORAL | Status: DC
  Administered 2018-09-29 – 2018-10-04 (×9): 100 mg via ORAL
  Filled 2018-09-29 (×9): qty 1

## 2018-09-29 MED ORDER — ACETAMINOPHEN 325 MG PO TABS
650.0000 mg | ORAL_TABLET | Freq: Four times a day (QID) | ORAL | Status: DC | PRN
  Administered 2018-10-02 – 2018-10-03 (×3): 650 mg via ORAL
  Filled 2018-09-29 (×4): qty 2

## 2018-09-29 MED ORDER — MORPHINE SULFATE (PF) 2 MG/ML IV SOLN
2.0000 mg | INTRAVENOUS | Status: DC | PRN
  Administered 2018-09-29 – 2018-10-02 (×5): 2 mg via INTRAVENOUS
  Filled 2018-09-29 (×5): qty 1

## 2018-09-29 MED ORDER — VANCOMYCIN HCL IN DEXTROSE 1-5 GM/200ML-% IV SOLN
1000.0000 mg | Freq: Three times a day (TID) | INTRAVENOUS | Status: DC
  Administered 2018-09-30 – 2018-10-04 (×14): 1000 mg via INTRAVENOUS
  Filled 2018-09-29 (×18): qty 200

## 2018-09-29 MED ORDER — INSULIN ASPART 100 UNIT/ML ~~LOC~~ SOLN
0.0000 [IU] | Freq: Every day | SUBCUTANEOUS | Status: DC
  Administered 2018-10-02 – 2018-10-03 (×2): 2 [IU] via SUBCUTANEOUS
  Filled 2018-09-29 (×2): qty 1

## 2018-09-29 MED ORDER — ONDANSETRON HCL 4 MG PO TABS: 4.0000 mg | ORAL_TABLET | Freq: Four times a day (QID) | ORAL | Status: DC | PRN

## 2018-09-29 MED ORDER — ONDANSETRON HCL 4 MG/2ML IJ SOLN: 4.0000 mg | Freq: Four times a day (QID) | INTRAMUSCULAR | Status: DC | PRN

## 2018-09-29 MED ORDER — MUPIROCIN 2 % EX OINT
1.0000 "application " | TOPICAL_OINTMENT | Freq: Two times a day (BID) | CUTANEOUS | Status: DC
  Administered 2018-09-29 – 2018-10-04 (×7): 1 via NASAL
  Filled 2018-09-29: qty 22

## 2018-09-29 MED ORDER — ATORVASTATIN CALCIUM 20 MG PO TABS
80.0000 mg | ORAL_TABLET | Freq: Every day | ORAL | Status: DC
  Administered 2018-09-29 – 2018-10-03 (×5): 80 mg via ORAL
  Filled 2018-09-29 (×5): qty 4

## 2018-09-29 MED ORDER — OXYCODONE-ACETAMINOPHEN 5-325 MG PO TABS
1.0000 | ORAL_TABLET | ORAL | Status: DC | PRN
  Administered 2018-09-29 (×2): 2 via ORAL
  Administered 2018-09-30 – 2018-10-01 (×2): 1 via ORAL
  Administered 2018-10-01: 2 via ORAL
  Administered 2018-10-01: 1 via ORAL
  Administered 2018-10-02 (×2): 2 via ORAL
  Administered 2018-10-03: 1 via ORAL
  Administered 2018-10-03: 2 via ORAL
  Filled 2018-09-29: qty 1
  Filled 2018-09-29: qty 2
  Filled 2018-09-29 (×2): qty 1
  Filled 2018-09-29 (×2): qty 2
  Filled 2018-09-29: qty 1
  Filled 2018-09-29 (×3): qty 2

## 2018-09-29 MED ORDER — POLYETHYLENE GLYCOL 3350 17 G PO PACK
17.0000 g | PACK | Freq: Every day | ORAL | Status: DC | PRN
  Filled 2018-09-29: qty 1

## 2018-09-29 MED ORDER — SODIUM CHLORIDE 0.9 % IV SOLN
INTRAVENOUS | Status: DC | PRN
  Administered 2018-09-29: 250 mL via INTRAVENOUS
  Administered 2018-09-30: 09:00:00 via INTRAVENOUS

## 2018-09-29 MED ORDER — PNEUMOCOCCAL VAC POLYVALENT 25 MCG/0.5ML IJ INJ: 0.5000 mL | INJECTION | INTRAMUSCULAR | Status: DC

## 2018-09-29 MED ORDER — NICOTINE 21 MG/24HR TD PT24
21.0000 mg | MEDICATED_PATCH | Freq: Every day | TRANSDERMAL | Status: DC
  Administered 2018-09-29 – 2018-09-30 (×2): 21 mg via TRANSDERMAL
  Filled 2018-09-29 (×3): qty 1

## 2018-09-29 MED ORDER — VANCOMYCIN HCL IN DEXTROSE 1-5 GM/200ML-% IV SOLN
1000.0000 mg | Freq: Once | INTRAVENOUS | Status: AC
  Administered 2018-09-29: 1000 mg via INTRAVENOUS
  Filled 2018-09-29: qty 200

## 2018-09-29 MED ORDER — LISINOPRIL 10 MG PO TABS
10.0000 mg | ORAL_TABLET | Freq: Every day | ORAL | Status: DC
  Administered 2018-09-29 – 2018-10-03 (×3): 10 mg via ORAL
  Filled 2018-09-29 (×4): qty 1

## 2018-09-29 NOTE — H&P (Signed)
[facility] - [place] [facility]   PATIENT NAME: Austin EvenerHosea Hudock    MR#:  [id number]  DATE OF BIRTH:  11/24/1955  DATE OF ADMISSION:  09/29/2018  PRIMARY CARE PHYSICIAN: [name], [name], MD   REQUESTING/REFERRING PHYSICIAN:   CHIEF COMPLAINT:  Foot wound   HISTORY OF PRESENT ILLNESS:  Austin [name]  is a 62 y.o. male with a known history of insulin requiring diabetes mellitus, history of DVT on Eliquis, Plavix and sent over to the hospital as a direct admission for worsening of the left foot wound with necrosis and gangrenous changes.  Patient is reporting of left foot pain and asking for pain medication during my examination.  Denies any chest pain or shortness of breath.  Patient is scheduled for wound debridement tomorrow by Dr. Graciela HusbandsKlein  PAST MEDICAL HISTORY:   Past Medical History:  Diagnosis Date  . Diabetes (HCC)   . DVT (deep venous thrombosis) (HCC)     PAST SURGICAL HISTOIRY:   Past Surgical History:  Procedure Laterality Date  . AMPUTATION TOE Left 02/10/2018   Procedure: AMPUTATION TOE;  Surgeon: Linus Galasline, Todd, DPM;  Location: [facility] ORS;  Service: Podiatry;  Laterality: Left;  . groin surgery    . LOWER EXTREMITY ANGIOGRAPHY Left 02/12/2018   Procedure: Lower Extremity Angiography;  Surgeon: Annice Needyew, Jason S, MD;  Location: [facility] INVASIVE CV LAB;  Service: Cardiovascular;  Laterality: Left;  . LOWER EXTREMITY INTERVENTION  02/12/2018   Procedure: LOWER EXTREMITY INTERVENTION;  Surgeon: Annice Needyew, Jason S, MD;  Location: [facility] INVASIVE CV LAB;  Service: Cardiovascular;;  . NECK SURGERY      SOCIAL HISTORY:   Social History   Tobacco Use  . Smoking status: Heavy Tobacco Smoker    Packs/day: 1.00    Years: 30.00    Pack years: 30.00    Types: Cigarettes  . Smokeless tobacco: Never Used  Substance Use Topics  . Alcohol use: No    FAMILY HISTORY:   Family History  Problem Relation Age of Onset  . Diabetes Mother   . Coronary artery  disease Father   . Hypertension Sister   . Leukemia Brother     DRUG ALLERGIES:  No Known Allergies  REVIEW OF SYSTEMS:  CONSTITUTIONAL: No fever, fatigue or weakness.  EYES: No blurred or double vision.  EARS, NOSE, AND THROAT: No tinnitus or ear pain.  RESPIRATORY: No cough, shortness of breath, wheezing or hemoptysis.  CARDIOVASCULAR: No chest pain, orthopnea, edema.  GASTROINTESTINAL: No nausea, vomiting, diarrhea or abdominal pain.  GENITOURINARY: No dysuria, hematuria.  ENDOCRINE: No polyuria, nocturia,  HEMATOLOGY: No anemia, easy bruising or bleeding SKIN: Bilateral foot wounds but left foot wound is getting worse MUSCULOSKELETAL: No joint pain or arthritis.   NEUROLOGIC: No tingling, numbness, weakness.  PSYCHIATRY: No anxiety or depression.   MEDICATIONS AT HOME:   Prior to Admission medications   Medication Sig Start Date End Date Taking? Authorizing Provider  acetaminophen (TYLENOL) 325 MG tablet Take 2 tablets (650 mg total) by mouth every 6 (six) hours as needed for mild pain (or Fever >/= 101). 02/12/18   Ramonita LabGouru, Matt Delpizzo, MD  amoxicillin-clavulanate (AUGMENTIN) 875-125 MG tablet Take 1 tablet by mouth every 12 (twelve) hours. 02/12/18   Ramonita LabGouru, Abenezer Odonell, MD  apixaban (ELIQUIS) 5 MG TABS tablet Take 1 tablet (5 mg total) by mouth 2 (two) times daily. 02/12/18   Ramonita LabGouru, Varie Machamer, MD  atorvastatin (LIPITOR) 80 MG tablet Take 1 tablet (80 mg total) by mouth daily at 6  PM. 02/12/18   Danesha Kirchoff, Deanna Artis, MD  cephALEXin (KEFLEX) 500 MG capsule Take 1 capsule (500 mg total) by mouth 3 (three) times daily. 08/16/18   Sherrie Mustache Roselyn Bering, PA-C  clopidogrel (PLAVIX) 75 MG tablet Take 1 tablet (75 mg total) by mouth daily. 02/12/18   Ramonita Lab, MD  docusate sodium (COLACE) 100 MG capsule Take 1 capsule (100 mg total) by mouth 2 (two) times daily. 02/12/18   Dayona Shaheen, Deanna Artis, MD  insulin glargine (LANTUS) 100 UNIT/ML injection Inject 0.15 mLs (15 Units total) into the skin daily. 02/13/18   Bogdan Vivona, Deanna Artis, MD   lisinopril (PRINIVIL,ZESTRIL) 10 MG tablet Take 10 mg by mouth daily.     [provider]  nicotine (NICODERM CQ - DOSED IN MG/24 HOURS) 21 mg/24hr patch Place 1 patch (21 mg total) onto the skin daily. 02/12/18   Jahden Schara, Deanna Artis, MD  oxyCODONE (OXY IR/ROXICODONE) 5 MG immediate release tablet Take 1 tablet (5 mg total) by mouth every 4 (four) hours as needed for moderate pain or breakthrough pain. 02/12/18   Ramonita Lab, MD      VITAL SIGNS:  Blood pressure (!) 158/89, pulse 85, temperature 98.1 F (36.7 C), temperature source Oral, resp. rate 20, height 6\' 4"  (1.93 m), weight 84.1 kg, SpO2 99 %.  PHYSICAL EXAMINATION:  GENERAL:  62 y.o.-year-old patient lying in the bed with no acute distress.  EYES: Pupils equal, round, reactive to light and accommodation. No scleral icterus. Extraocular muscles intact.  HEENT: Head atraumatic, normocephalic. Oropharynx and nasopharynx clear.  NECK:  Supple, no jugular venous distention. No thyroid enlargement, no tenderness.  LUNGS: Normal breath sounds bilaterally, no wheezing, rales,rhonchi or crepitation. No use of accessory muscles of respiration.  CARDIOVASCULAR: S1, S2 normal. No murmurs, rubs, or gallops.  ABDOMEN: Soft, nontender, nondistended. Bowel sounds present.  EXTREMITIES: No pedal edema, cyanosis, or clubbing.  NEUROLOGIC: Awake, alert and oriented x3 sensation intact. Gait not checked.  PSYCHIATRIC: The patient is alert and oriented x 3.  SKIN: Bilateral lower extremity feet covered with clean dressing   LABORATORY PANEL:   CBC No results for input(s): WBC, HGB, HCT, PLT in the last 168 hours. ------------------------------------------------------------------------------------------------------------------  Chemistries  No results for input(s): NA, K, CL, CO2, GLUCOSE, BUN, CREATININE, CALCIUM, MG, AST, ALT, ALKPHOS, BILITOT in the last 168 hours.  Invalid input(s):  GFRCGP ------------------------------------------------------------------------------------------------------------------  Cardiac Enzymes No results for input(s): TROPONINI in the last 168 hours. ------------------------------------------------------------------------------------------------------------------  RADIOLOGY:  No results found.  EKG:  No orders found for this or any previous visit.  IMPRESSION AND PLAN:     #Bilateral lower extremity cellulitis with worsening of the left foot wound with gangrenous changes Admit to MedSurg unit N.p.o. after midnight Podiatry consult placed to Dr. Alberteen Spindle who is planning to do wound debridement tomorrow IV vancomycin  #Peripheral vascular disease Vascular surgery consult placed to Dr. Ashby Dawes requiring diabetes mellitus Hold Lantus and provide sliding scale insulin diabetic diet for now and n.p.o. after midnight  #History of DVT hold Eliquis and Plavix for now as patient is having on debridement tomorrow Plan is to resume Eliquis following surgery      All the records are reviewed and case discussed with ED provider. Management plans discussed with the patient, family and they are in agreement.  CODE STATUS: fc  TOTAL TIME TAKING CARE OF THIS PATIENT: .   Note: This dictation was prepared with Dragon dictation along with smaller phrase technology. Any transcriptional errors that result from this process are  unintentional.  Ramonita LabAruna Dalila Arca M.D on 09/29/2018 at 3:53 PM  Between 7am to 6pm - Pager - [phone]  After 6pm go to [email or web address] - password [email or web address] [facility]  [facility]  Office  [phone]  CC: Primary care physician; [name], [name], MD

## 2018-09-29 NOTE — Progress Notes (Signed)
Family Meeting Note  Advance Directive:yes  Today a meeting took place with the Patient.    The following clinical team members were present during this meeting:MD  The following were discussed:Patient's diagnosis: Bilateral lower extremity cellulitis with left foot wound worsening with gangrenous changes, history of DVT, peripheral vascular disease, insulin requiring diabetes mellitus, treatment plan of care discussed in detail with the patient.  He verbalized understanding of the plan.  Patient's progosis: Unable to determine and Goals for treatment: Full Code  Son Austin [name] is HCPOA  Additional follow-up to be provided: Podiatry, vascular surgery, hospitalist  Time spent during discussion:17 MIN  Ramonita LabAruna Reyonna Haack, MD

## 2018-09-29 NOTE — Consult Note (Signed)
Reason for Consult: Chronic wound left foot with necrosis and gangrenous changes. Referring Physician: Mody  Eddie DibblesHosea D Penn is an 62 y.o. male.  HPI: This is a 62 year old male with history of diabetic neuropathy and peripheral vascular disease with a chronic nonhealing wound on his left foot status post amputation of the second toe in May.  Patient has not been able to follow-up outpatient as scheduled.  Recently evaluated by the wound care center who recommended debridement.  Past Medical History:  Diagnosis Date  . Diabetes (HCC)   . DVT (deep venous thrombosis) (HCC)     Past Surgical History:  Procedure Laterality Date  . AMPUTATION TOE Left 02/10/2018   Procedure: AMPUTATION TOE;  Surgeon: Linus Galasline, Brooks Kinnan, DPM;  Location: [facility] ORS;  Service: Podiatry;  Laterality: Left;  . groin surgery    . LOWER EXTREMITY ANGIOGRAPHY Left 02/12/2018   Procedure: Lower Extremity Angiography;  Surgeon: Annice Needyew, Jason S, MD;  Location: [facility] INVASIVE CV LAB;  Service: Cardiovascular;  Laterality: Left;  . LOWER EXTREMITY INTERVENTION  02/12/2018   Procedure: LOWER EXTREMITY INTERVENTION;  Surgeon: Annice Needyew, Jason S, MD;  Location: [facility] INVASIVE CV LAB;  Service: Cardiovascular;;  . NECK SURGERY      Family History  Problem Relation Age of Onset  . Diabetes Mother   . Coronary artery disease Father   . Hypertension Sister   . Leukemia Brother     Social History:  reports that he has been smoking cigarettes. He has a 30.00 pack-year smoking history. He has never used smokeless tobacco. He reports that he does not drink alcohol or use drugs.  Allergies: No Known Allergies  Medications:  Scheduled: . [START ON 09/30/2018] pneumococcal 23 valent vaccine  0.5 mL Intramuscular Tomorrow-1000    No results found for this or any previous visit (from the past 48 hour(s)).  No results found.  Review of Systems  Constitutional: Negative for chills and fever.  HENT: Negative for congestion and tinnitus.   Eyes:  Negative for blurred vision and double vision.  Respiratory: Negative for cough and shortness of breath.   Cardiovascular: Negative for chest pain.  Gastrointestinal: Negative for nausea and vomiting.  Genitourinary: Negative for dysuria.  Musculoskeletal: Negative for back pain and myalgias.  Skin:       Chronic draining wound on his left foot with previous amputation of the second toe last May.  Neurological:       Does relate some chronic neuropathy related to his diabetes.  Endo/Heme/Allergies: Does not bruise/bleed easily.  Psychiatric/Behavioral: Negative for depression and memory loss.   Blood pressure (!) 158/89, pulse 85, temperature 98.1 F (36.7 C), temperature source Oral, resp. rate 20, height 6\' 4"  (1.93 m), weight 84.1 kg, SpO2 99 %. Physical Exam  Cardiovascular:  DP and PT pulses are significantly diminished and barely palpable bilateral.  Musculoskeletal:     Comments: Stiff range of motion in the pedal joints.  Previous amputation of the left first and second toes.  Muscle testing deferred.  Neurological:  Loss of protective threshold with a monofilament wire distally in the forefoot and toes.  Skin:  The skin is thin dry and atrophic with absent hair growth.  Some bilateral hyperpigmentation and edema on the left leg more than the right.  Full-thickness chronic ulceration at the left second toe amputation site with necrosis and malodorous tissue extending down to the level of the metatarsal head.  Superficial ulceration is noted beneath the right fifth metatarsal head.    Assessment/Plan:  Assessment: 1.  Full-thickness ulceration with necrosis of the deep tissues and gangrenous changes. 2.  Diabetes with associated neuropathy. 3.  Peripheral vascular disease. 4.  Superficial ulceration right forefoot.  Plan: Discussed with the patient the need for debridement of the necrotic wound on his left foot.  Discussed possible risks and complications of the procedure and  anesthesia including inability of the wound to heal due to his diabetes or peripheral vascular disease or continued infection.  Discussed the risk for lower limb amputation.  Also discussed that he will need vascular evaluation and intervention while in the hospital.  At this point we will plan for surgery for debridement tomorrow morning and hopefully revascularization on Thursday or Friday unless vascular surgery advises otherwise.  N.p.o. after midnight.  Consent for debridement of soft tissue and bone left foot.  Again plan for surgery tomorrow morning.  Ricci Barkerodd W Lynne Takemoto 09/29/2018, 12:08 PM

## 2018-09-29 NOTE — Consult Note (Signed)
Pharmacy Antibiotic Note  Austin [name] is a 62 y.o. male admitted on 09/29/2018 with cellulitis.  Pharmacy has been consulted for vancomycin dosing. He has [facility] earlier in the year with vancomycin. That dosing information and level was used to prepare this note.  Plan: Vancomycin 1000mg  IV every 8 hours beginning 6 hours after 1st 1000mg  dose.  Ke: 0.099  T1/2: 6.9h  Vd: 59L  Css: 29.4/14.6 mcg/mL  Goal trough 15-20 mcg/mL  Vt prior to 5th dose  Height: 6\' 4"  (193 cm) Weight: 185 lb 6.5 oz (84.1 kg) IBW/kg (Calculated) : 86.8  Temp (24hrs), Avg:98.1 F (36.7 C), Min:98.1 F (36.7 C), Max:98.1 F (36.7 C)  Antimicrobials this admission: vancomycin 12/24 >>   Microbiology results: 12/24 MRSA PCR: pending  Thank you for allowing pharmacy to be a part of this patient's care.  Lowella Bandyodney D Aren Pryde, PharmD 09/29/2018 4:33 PM

## 2018-09-29 NOTE — Progress Notes (Signed)
Pt direct admit from Dr. Dory Larsenline's office. Pt A&Ox4, VSS. Pt oriented to room and plan of care.

## 2018-09-29 NOTE — Progress Notes (Signed)
   09/29/18 1300  Clinical Encounter Type  Visited With Patient  Visit Type Initial;Other (Comment) (HCPOA)  Referral From Physician  Spiritual Encounters  Spiritual Needs Literature  HCPOA/AD materials dropped off with patient. CH reviewed materials with patient. CH available if additional information required

## 2018-09-30 ENCOUNTER — Inpatient Hospital Stay: Payer: Medicare Other | Admitting: Anesthesiology

## 2018-09-30 ENCOUNTER — Encounter
Admission: AD | Disposition: A | Discharge status: Home Health | Payer: Self-pay | Source: Ambulatory Visit | Attending: Internal Medicine

## 2018-09-30 HISTORY — PX: IRRIGATION AND DEBRIDEMENT FOOT: SHX6602

## 2018-09-30 LAB — COMPREHENSIVE METABOLIC PANEL
ALK PHOS: 81 U/L (ref 38–126)
ALT: 17 U/L (ref 0–44)
AST: 19 U/L (ref 15–41)
Albumin: 3.3 g/dL — ABNORMAL LOW (ref 3.5–5.0)
Anion gap: 8 (ref 5–15)
BUN: 15 mg/dL (ref 8–23)
CO2: 24 mmol/L (ref 22–32)
Calcium: 9 mg/dL (ref 8.9–10.3)
Chloride: 106 mmol/L (ref 98–111)
Creatinine, Ser: 1.02 mg/dL (ref 0.61–1.24)
GFR calc Af Amer: >60 mL/min (ref >60)
GFR calc non Af Amer: >60 mL/min (ref >60)
Glucose, Bld: 277 mg/dL — ABNORMAL HIGH (ref 70–99)
Potassium: 4.4 mmol/L (ref 3.5–5.1)
SODIUM: 138 mmol/L (ref 135–145)
Total Bilirubin: 0.9 mg/dL (ref 0.3–1.2)
Total Protein: 6.9 g/dL (ref 6.5–8.1)

## 2018-09-30 LAB — CBC
HCT: 42.3 % (ref 39.0–52.0)
Hemoglobin: 14.3 g/dL (ref 13.0–17.0)
MCH: 29.4 pg (ref 26.0–34.0)
MCHC: 33.8 g/dL (ref 30.0–36.0)
MCV: 87 fL (ref 80.0–100.0)
Platelets: 203 10*3/uL (ref 150–400)
RBC: 4.86 MIL/uL (ref 4.22–5.81)
RDW: 13 % (ref 11.5–15.5)
WBC: 5.5 10*3/uL (ref 4.0–10.5)
nRBC: 0 % (ref 0.0–0.2)

## 2018-09-30 LAB — GLUCOSE, CAPILLARY
GLUCOSE-CAPILLARY: 120 mg/dL — AB (ref 70–99)
Glucose-Capillary: 222 mg/dL — ABNORMAL HIGH (ref 70–99)
Glucose-Capillary: 143 mg/dL — ABNORMAL HIGH (ref 70–99)
Glucose-Capillary: 211 mg/dL — ABNORMAL HIGH (ref 70–99)
Glucose-Capillary: 170 mg/dL — ABNORMAL HIGH (ref 70–99)

## 2018-09-30 SURGERY — IRRIGATION AND DEBRIDEMENT FOOT
Anesthesia: General | Laterality: Left

## 2018-09-30 MED ORDER — PHENYLEPHRINE HCL 10 MG/ML IJ SOLN
INTRAMUSCULAR | Status: AC
  Filled 2018-09-30: qty 1

## 2018-09-30 MED ORDER — LIDOCAINE HCL (CARDIAC) PF 100 MG/5ML IV SOSY
PREFILLED_SYRINGE | INTRAVENOUS | Status: DC | PRN
  Administered 2018-09-30: 100 mg via INTRAVENOUS

## 2018-09-30 MED ORDER — MIDAZOLAM HCL 2 MG/2ML IJ SOLN
INTRAMUSCULAR | Status: AC
  Filled 2018-09-30: qty 2

## 2018-09-30 MED ORDER — ONDANSETRON HCL 4 MG/2ML IJ SOLN
INTRAMUSCULAR | Status: DC | PRN
  Administered 2018-09-30: 4 mg via INTRAVENOUS

## 2018-09-30 MED ORDER — FENTANYL CITRATE (PF) 100 MCG/2ML IJ SOLN: 25.0000 ug | INTRAMUSCULAR | Status: DC | PRN

## 2018-09-30 MED ORDER — BUPIVACAINE HCL (PF) 0.5 % IJ SOLN
INTRAMUSCULAR | Status: DC | PRN
  Administered 2018-09-30: 10 mL

## 2018-09-30 MED ORDER — EPHEDRINE SULFATE 50 MG/ML IJ SOLN
INTRAMUSCULAR | Status: AC
  Filled 2018-09-30: qty 1

## 2018-09-30 MED ORDER — PROPOFOL 10 MG/ML IV BOLUS
INTRAVENOUS | Status: AC
  Filled 2018-09-30: qty 20

## 2018-09-30 MED ORDER — PROPOFOL 10 MG/ML IV BOLUS
INTRAVENOUS | Status: DC | PRN
  Administered 2018-09-30: 150 mg via INTRAVENOUS

## 2018-09-30 MED ORDER — ONDANSETRON HCL 4 MG/2ML IJ SOLN: 4.0000 mg | Freq: Once | INTRAMUSCULAR | Status: DC | PRN

## 2018-09-30 MED ORDER — EPHEDRINE SULFATE 50 MG/ML IJ SOLN
INTRAMUSCULAR | Status: DC | PRN
  Administered 2018-09-30: 10 mg via INTRAVENOUS

## 2018-09-30 MED ORDER — FENTANYL CITRATE (PF) 100 MCG/2ML IJ SOLN
INTRAMUSCULAR | Status: DC | PRN
  Administered 2018-09-30: 50 ug via INTRAVENOUS

## 2018-09-30 MED ORDER — FENTANYL CITRATE (PF) 100 MCG/2ML IJ SOLN
INTRAMUSCULAR | Status: AC
  Filled 2018-09-30: qty 2

## 2018-09-30 MED ORDER — LIDOCAINE HCL (PF) 2 % IJ SOLN
INTRAMUSCULAR | Status: AC
  Filled 2018-09-30: qty 10

## 2018-09-30 MED ORDER — MIDAZOLAM HCL 2 MG/2ML IJ SOLN
INTRAMUSCULAR | Status: DC | PRN
  Administered 2018-09-30: 2 mg via INTRAVENOUS

## 2018-09-30 MED ORDER — PHENYLEPHRINE HCL 10 MG/ML IJ SOLN
INTRAMUSCULAR | Status: DC | PRN
  Administered 2018-09-30 (×3): 100 ug via INTRAVENOUS

## 2018-09-30 SURGICAL SUPPLY — 48 items
BANDAGE ELASTIC 4 LF NS (GAUZE/BANDAGES/DRESSINGS) ×3 IMPLANT
BLADE MINI RND TIP GREEN BEAV (BLADE) ×3 IMPLANT
BLADE OSCILLATING/SAGITTAL (BLADE) ×2
BLADE SURG 15 STRL LF DISP TIS (BLADE) ×1 IMPLANT
BLADE SURG 15 STRL SS (BLADE) ×2
BLADE SW THK.38XMED LNG THN (BLADE) ×1 IMPLANT
BNDG CONFORM 2 STRL LF (GAUZE/BANDAGES/DRESSINGS) ×3 IMPLANT
BNDG ESMARK 4X12 TAN STRL LF (GAUZE/BANDAGES/DRESSINGS) ×3 IMPLANT
BNDG GAUZE 4.5X4.1 6PLY STRL (MISCELLANEOUS) ×3 IMPLANT
CANISTER SUCT 1200ML W/VALVE (MISCELLANEOUS) ×3 IMPLANT
COVER WAND RF STERILE (DRAPES) IMPLANT
CUFF TOURN 18 STER (MISCELLANEOUS) IMPLANT
CUFF TOURN DUAL PL 12 NO SLV (MISCELLANEOUS) ×3 IMPLANT
DRAPE FLUOR MINI C-ARM 54X84 (DRAPES) IMPLANT
DURAPREP 26ML APPLICATOR (WOUND CARE) ×3 IMPLANT
ELECT REM PT RETURN 9FT ADLT (ELECTROSURGICAL) ×3
ELECTRODE REM PT RTRN 9FT ADLT (ELECTROSURGICAL) ×1 IMPLANT
GAUZE PETRO XEROFOAM 1X8 (MISCELLANEOUS) ×3 IMPLANT
GAUZE SPONGE 4X4 12PLY STRL (GAUZE/BANDAGES/DRESSINGS) ×3 IMPLANT
GLOVE BIO SURGEON STRL SZ7.5 (GLOVE) ×3 IMPLANT
GLOVE INDICATOR 8.0 STRL GRN (GLOVE) ×3 IMPLANT
GOWN STRL REUS W/ TWL LRG LVL3 (GOWN DISPOSABLE) ×2 IMPLANT
GOWN STRL REUS W/TWL LRG LVL3 (GOWN DISPOSABLE) ×4
HANDPIECE VERSAJET DEBRIDEMENT (MISCELLANEOUS) ×3 IMPLANT
LABEL OR SOLS (LABEL) IMPLANT
NEEDLE FILTER BLUNT 18X 1/2SAF (NEEDLE) ×2
NEEDLE FILTER BLUNT 18X1 1/2 (NEEDLE) ×1 IMPLANT
NEEDLE HYPO 25X1 1.5 SAFETY (NEEDLE) ×6 IMPLANT
NS IRRIG 500ML POUR BTL (IV SOLUTION) ×3 IMPLANT
PACK EXTREMITY ARMC (MISCELLANEOUS) ×3 IMPLANT
PAD ABD DERMACEA PRESS 5X9 (GAUZE/BANDAGES/DRESSINGS) ×6 IMPLANT
PENCIL ELECTRO HAND CTR (MISCELLANEOUS) IMPLANT
RASP SM TEAR CROSS CUT (RASP) IMPLANT
SOL PREP PVP 2OZ (MISCELLANEOUS) ×3
SOLUTION PREP PVP 2OZ (MISCELLANEOUS) ×1 IMPLANT
SPONGE LAP 18X18 RF (DISPOSABLE) ×6 IMPLANT
STOCKINETTE STRL 4IN 9604848 (GAUZE/BANDAGES/DRESSINGS) ×3 IMPLANT
STOCKINETTE STRL 6IN 960660 (GAUZE/BANDAGES/DRESSINGS) IMPLANT
SUT ETHILON 3-0 FS-10 30 BLK (SUTURE) ×3
SUT ETHILON 4-0 (SUTURE)
SUT ETHILON 4-0 FS2 18XMFL BLK (SUTURE)
SUT VIC AB 3-0 SH 27 (SUTURE)
SUT VIC AB 3-0 SH 27X BRD (SUTURE) IMPLANT
SUT VIC AB 4-0 FS2 27 (SUTURE) IMPLANT
SUTURE EHLN 3-0 FS-10 30 BLK (SUTURE) ×1 IMPLANT
SUTURE ETHLN 4-0 FS2 18XMF BLK (SUTURE) IMPLANT
SYR 10ML LL (SYRINGE) ×6 IMPLANT
SYR 3ML LL SCALE MARK (SYRINGE) ×3 IMPLANT

## 2018-09-30 NOTE — Transfer of Care (Signed)
Immediate Anesthesia Transfer of Care Note  Patient: Austin [name]  Procedure(s) Performed: IRRIGATION AND DEBRIDEMENT FOOT (Left )  Patient Location: PACU  Anesthesia Type:General  Level of Consciousness: drowsy  Airway & Oxygen Therapy: Patient Spontanous Breathing and Patient connected to face mask oxygen  Post-op Assessment: Report given to RN and Post -op Vital signs reviewed and stable  Post vital signs: Reviewed and stable  Last Vitals:  Vitals Value Taken Time  BP 122/74 09/30/2018 10:03 AM  Temp    Pulse 79 09/30/2018 10:03 AM  Resp 12 09/30/2018 10:04 AM  SpO2 100 % 09/30/2018 10:03 AM  Vitals shown include unvalidated device data.  Last Pain:  Vitals:   09/30/18 0736  TempSrc: Oral  PainSc:       Patients Stated Pain Goal: 0 (09/30/18 0424)  Complications: No apparent anesthesia complications

## 2018-09-30 NOTE — Progress Notes (Signed)
Per RN patient will have I&D today. Clinical Child psychotherapistocial Worker (CSW) will follow for any needs.   Baker Hughes IncorporatedBailey Ahava Kissoon, LCSW [phone]

## 2018-09-30 NOTE — Anesthesia Preprocedure Evaluation (Signed)
Anesthesia Evaluation  Patient identified by MRN, date of birth, ID band Patient awake    Reviewed: Allergy & Precautions, H&P , NPO status , Patient's Chart, lab work & pertinent test results, reviewed documented beta blocker date and time   Airway Mallampati: II  TM Distance: >3 FB Neck ROM: full    Dental  (+) Teeth Intact   Pulmonary neg pulmonary ROS, Current Smoker,    Pulmonary exam normal        Cardiovascular Exercise Tolerance: Poor hypertension, On Medications negative cardio ROS Normal cardiovascular exam Rate:Normal     Neuro/Psych negative neurological ROS  negative psych ROS   GI/Hepatic negative GI ROS, Neg liver ROS,   Endo/Other  negative endocrine ROSdiabetes  Renal/GU negative Renal ROS  negative genitourinary   Musculoskeletal   Abdominal   Peds  Hematology negative hematology ROS (+)   Anesthesia Other Findings   Reproductive/Obstetrics negative OB ROS                             Anesthesia Physical Anesthesia Plan  ASA: III and emergent  Anesthesia Plan: General LMA   Post-op Pain Management:    Induction:   PONV Risk Score and Plan:   Airway Management Planned:   Additional Equipment:   Intra-op Plan:   Post-operative Plan:   Informed Consent: I have reviewed the patients History and Physical, chart, labs and discussed the procedure including the risks, benefits and alternatives for the proposed anesthesia with the patient or authorized representative who has indicated his/her understanding and acceptance.     Plan Discussed with: CRNA  Anesthesia Plan Comments:         Anesthesia Quick Evaluation

## 2018-09-30 NOTE — Anesthesia Post-op Follow-up Note (Signed)
Anesthesia QCDR form completed.        

## 2018-09-30 NOTE — Anesthesia Procedure Notes (Signed)
Procedure Name: LMA Insertion Performed by: Harneet Noblett, CRNA Pre-anesthesia Checklist: Patient identified, Patient being monitored, Timeout performed, Emergency Drugs available and Suction available Patient Re-evaluated:Patient Re-evaluated prior to induction Oxygen Delivery Method: Circle system utilized Preoxygenation: Pre-oxygenation with 100% oxygen Induction Type: IV induction Ventilation: Mask ventilation without difficulty LMA: LMA inserted LMA Size: 4.5 Tube type: Oral Number of attempts: 1 Placement Confirmation: positive ETCO2 and breath sounds checked- equal and bilateral Tube secured with: Tape Dental Injury: Teeth and Oropharynx as per pre-operative assessment        

## 2018-09-30 NOTE — Op Note (Signed)
Date of operation: 09/30/2018.  Surgeon: Ricci Barkerodd W. Shatasha Lambing D.P.M.  Preoperative diagnosis: Full-thickness ulceration left forefoot with necrosis and gangrenous changes.  Postoperative diagnosis: Same.  Procedure: Debridement soft tissue and bone left forefoot.  Anesthesia: LMA with local.  Hemostasis: None.  Estimated blood loss: 5 to 10 cc.  Cultures: Deep wound cultures left forefoot.  Pathology: Left second metatarsal head.  Complications: None other than minimal bleeding during the procedure.  Operative indications: This is a 62 year old male with diabetes and peripheral vascular disease with history of amputation of the left second toe approximately 7 months ago with a nonhealing wound on the left foot since that time.  Patient has been noncompliant with follow-up over that time and presented yesterday with infection and necrosis in the left foot and was admitted for antibiotics and debridement.  Also for vascular evaluation during his hospital stay.  Operative procedure: Patient was taken to the operating room and placed on the table in the supine position.  Following satisfactory LMA anesthesia the left forefoot was anesthetized with 10 cc of 0.5% bupivacaine plain.  The left foot was then prepped and draped in the usual sterile fashion.  Attention was directed to the distal aspect of the left foot where a full-thickness open wound was noted at the previous second toe amputation site.  An approximate 3 cm dorsal incision was made coursing proximal over the second metatarsal and a plantar 1.5 cm linear incision.  This was carried sharply down to the level of the bone.  There was some significant necrosis in the wound extending down to the level of the second metatarsal head and along the medial aspect of the third toe and joint.  Necrotic tissue was grossly excised using a Beaver blade.  The second metatarsal head was freed from the normal surrounding anatomy and incised and resected in  toto using a sagittal saw.  Minimal evidence of any bleeding was noted.  The wound was then thoroughly debrided using a versa jet debrider on a setting of 4 to remove all necrotic tissue.  Following debridement there was no clear evidence of any exposed bone or joint beyond the capsule at the third metatarsal phalangeal joint.  The wound was then irrigated with the versa jet on a setting of 2 and then copiously using a bulb syringe with sterile saline.  The incision was then closed using 3-0 nylon simple interrupted sutures.  Xeroform 4 x 4's con form and Kerlix applied to the left lower extremity followed by an Ace wrap.  Patient was awakened and transported to the PACU having tolerated the procedure and anesthesia well.

## 2018-09-30 NOTE — Progress Notes (Signed)
[facility] [place] [facility]   PATIENT NAME: Austin [name]    MR#:  [id number]  DATE OF BIRTH:  04/20/1956  SUBJECTIVE:  CHIEF COMPLAINT:  Pt seen today after sx, resting ok, denies pain at this time   REVIEW OF SYSTEMS:  CONSTITUTIONAL: No fever, fatigue or weakness.  EYES: No blurred or double vision.  EARS, NOSE, AND THROAT: No tinnitus or ear pain.  RESPIRATORY: No cough, shortness of breath, wheezing or hemoptysis.  CARDIOVASCULAR: No chest pain, orthopnea, edema.  GASTROINTESTINAL: No nausea, vomiting, diarrhea or abdominal pain.  GENITOURINARY: No dysuria, hematuria.  ENDOCRINE: No polyuria, nocturia,  HEMATOLOGY: No anemia, easy bruising or bleeding SKIN: No rash or lesion. MUSCULOSKELETAL: No joint pain or arthritis.   NEUROLOGIC: No tingling, numbness, weakness.  PSYCHIATRY: No anxiety or depression.   DRUG ALLERGIES:  No Known Allergies  VITALS:  Blood pressure 105/76, pulse 93, temperature 98.3 F (36.8 C), resp. rate 14, height 6\' 4"  (1.93 m), weight 84.1 kg, SpO2 98 %.  PHYSICAL EXAMINATION:  GENERAL:  62 y.o.-year-old patient lying in the bed with no acute distress.  EYES: Pupils equal, round, reactive to light and accommodation. No scleral icterus. Extraocular muscles intact.  HEENT: Head atraumatic, normocephalic. Oropharynx and nasopharynx clear.  NECK:  Supple, no jugular venous distention. No thyroid enlargement, no tenderness.  LUNGS: Normal breath sounds bilaterally, no wheezing, rales,rhonchi or crepitation. No use of accessory muscles of respiration.  CARDIOVASCULAR: S1, S2 normal. No murmurs, rubs, or gallops.  ABDOMEN: Soft, nontender, nondistended. Bowel sounds present. No organomegaly or mass.  EXTREMITIES: No pedal edema, cyanosis, or clubbing.  NEUROLOGIC: Cranial nerves II through XII are intact. Muscle strength 5/5 in all extremities. Sensation intact. Gait not checked.  PSYCHIATRIC: The patient is alert and  oriented x 3.  SKIN: No obvious rash, lesion, or ulcer.    LABORATORY PANEL:   CBC Recent Labs  Lab 09/30/18 0337  WBC 5.5  HGB 14.3  HCT 42.3  PLT 203   ------------------------------------------------------------------------------------------------------------------  Chemistries  Recent Labs  Lab 09/30/18 0337  NA 138  K 4.4  CL 106  CO2 24  GLUCOSE 277*  BUN 15  CREATININE 1.02  CALCIUM 9.0  AST 19  ALT 17  ALKPHOS 81  BILITOT 0.9   ------------------------------------------------------------------------------------------------------------------  Cardiac Enzymes No results for input(s): TROPONINI in the last 168 hours. ------------------------------------------------------------------------------------------------------------------  RADIOLOGY:  No results found.  EKG:  No orders found for this or any previous visit.  ASSESSMENT AND PLAN:     #Bilateral lower extremity cellulitis with worsening of the left foot wound with gangrenous changes Podiatry consult placed to Dr. Alberteen Spindleline status post left foot debridement today IV vancomycin Pain management as needed   #Peripheral vascular disease Vascular surgery consult placed to Dr. Evie LacksEsco, planning to do angiogram tomorrow Patient is status post prior iliac, SFA and popliteal stenting and tibial angioplasty in May 2019  #Insulin requiring diabetes mellitus Hold Lantus and provide sliding scale insulin diabetic diet for now and n.p.o. after midnight  #History of DVT hold Eliquis and Plavix for now as patient is having on debridement tomorrow Plan is to resume Eliquis following angiogram tomorrow  All the records are reviewed and case discussed with Care Management/Social Workerr. Management plans discussed with the patient, family and they are in agreement.  CODE STATUS: fc  TOTAL TIME TAKING CARE OF THIS PATIENT: 35 minutes.   POSSIBLE D/C IN 2  DAYS, DEPENDING ON CLINICAL CONDITION.  Note: This  dictation was prepared with Dragon dictation along with smaller phrase technology. Any transcriptional errors that result from this process are unintentional.   Ramonita LabAruna Aisia Correira M.D on 09/30/2018 at 1:08 PM  Between 7am to 6pm - Pager - [phone] After 6pm go to [email or web address] - password [email or web address] [facility]  [facility]  Office  [phone]  CC: Primary care physician; [name], [name], MD

## 2018-09-30 NOTE — Anesthesia Postprocedure Evaluation (Signed)
Anesthesia Post Note  Patient: Austin [name]  Procedure(s) Performed: IRRIGATION AND DEBRIDEMENT FOOT (Left )  Patient location during evaluation: PACU Anesthesia Type: General Level of consciousness: awake and alert Pain management: pain level controlled Vital Signs Assessment: post-procedure vital signs reviewed and stable Respiratory status: spontaneous breathing, nonlabored ventilation, respiratory function stable and patient connected to nasal cannula oxygen Cardiovascular status: blood pressure returned to baseline and stable Postop Assessment: no apparent nausea or vomiting Anesthetic complications: no     Last Vitals:  Vitals:   09/30/18 1018 09/30/18 1033  BP: (!) 103/57 93/70  Pulse: 75 87  Resp: 15 13  Temp:    SpO2: 100% 99%    Last Pain:  Vitals:   09/30/18 1033  TempSrc:   PainSc: 0-No pain                 Yevette EdwardsJames G Luken Shadowens

## 2018-09-30 NOTE — Consult Note (Signed)
Reason for Consult:Non Healing Left foot wound Referring Physician: Dr. Aleen Campi is an 62 y.o. male.  HPI: Patient with diabetic neuropathy; peripheral vascular disease with a chronic nonhealing wound on his left foot status post amputation of the second toe. Poor follow up/wound care. Progressive  Necrosis. Presents for debridement per Podiatry, found to have diminished pedal pulses. The patient complains of claudication. He denies rest pain. It is unclear if he has been compliant with his Plavix. He had recent intervention per Dr. Wyn Quaker in May:  Percutaneous transluminal angioplasty of left peroneal artery and tibioperoneal trunk with 2.5 mm diameter by 30 cm length angioplasty balloon; Percutaneous transluminal angioplasty of the left SFA and popliteal arteries with a 5 mm diameter by 22 cm length [name] drug-coated angioplasty balloon distally and a 6 mm diameter by 22 cm length [name] drug-coated angioplasty balloon proximally; Viabahn stent placement to the left SFA and popliteal arteries with two 6 mm diameter by 25 cm length stents for multiple areas of greater than 50% residual stenosis and dissection after angioplasty; Lifestream covered stent placement to the left common iliac artery with 7 mm diameter by 26 mm length stent postdilated with an 8 mm [name] drug-coated balloon.  Past Medical History:  Diagnosis Date  . Diabetes (HCC)   . DVT (deep venous thrombosis) (HCC)     Past Surgical History:  Procedure Laterality Date  . AMPUTATION TOE Left 02/10/2018   Procedure: AMPUTATION TOE;  Surgeon: Linus Galas, DPM;  Location: [facility] ORS;  Service: Podiatry;  Laterality: Left;  . groin surgery    . LOWER EXTREMITY ANGIOGRAPHY Left 02/12/2018   Procedure: Lower Extremity Angiography;  Surgeon: Annice Needy, MD;  Location: [facility] INVASIVE CV LAB;  Service: Cardiovascular;  Laterality: Left;  . LOWER EXTREMITY INTERVENTION  02/12/2018   Procedure: LOWER EXTREMITY INTERVENTION;  Surgeon:  Annice Needy, MD;  Location: [facility] INVASIVE CV LAB;  Service: Cardiovascular;;  . NECK SURGERY      Family History  Problem Relation Age of Onset  . Diabetes Mother   . Coronary artery disease Father   . Hypertension Sister   . Leukemia Brother     Social History:  reports that he has been smoking cigarettes. He has a 30.00 pack-year smoking history. He has never used smokeless tobacco. He reports that he does not drink alcohol or use drugs.  Allergies: No Known Allergies  Medications: I have reviewed the patient's current medications.  Results for orders placed or performed during the hospital encounter of 09/29/18 (from the past 48 hour(s))  Surgical PCR screen     Status: None   Collection Time: 09/29/18  4:14 PM  Result Value Ref Range   MRSA, PCR NEGATIVE NEGATIVE   Staphylococcus aureus NEGATIVE NEGATIVE    Comment: (NOTE) The Xpert SA Assay (FDA approved for NASAL specimens in patients 64 years of age and older), is one component of a comprehensive surveillance program. It is not intended to diagnose infection nor to guide or monitor treatment. Performed at [facility], [address] [place], [place] [address]   Basic metabolic panel     Status: Abnormal   Collection Time: 09/29/18  4:49 PM  Result Value Ref Range   Sodium 135 135 - 145 mmol/L   Potassium 3.8 3.5 - 5.1 mmol/L   Chloride 105 98 - 111 mmol/L   CO2 22 22 - 32 mmol/L   Glucose, Bld 197 (H) 70 - 99 mg/dL   BUN  12 8 - 23 mg/dL   Creatinine, Ser 1.610.85 0.61 - 1.24 mg/dL   Calcium 8.8 (L) 8.9 - 10.3 mg/dL   GFR calc non Af Amer >60 >60 mL/min   GFR calc Af Amer >60 >60 mL/min   Anion gap 8 5 - 15    Comment: Performed at [facility], [address] [place], [place] [address]  Glucose, capillary     Status: Abnormal   Collection Time: 09/29/18  5:01 PM  Result Value Ref Range   Glucose-Capillary 189 (H) 70 - 99 mg/dL  Glucose, capillary     Status: Abnormal   Collection Time:  09/29/18  9:35 PM  Result Value Ref Range   Glucose-Capillary 130 (H) 70 - 99 mg/dL  Comprehensive metabolic panel     Status: Abnormal   Collection Time: 09/30/18  3:37 AM  Result Value Ref Range   Sodium 138 135 - 145 mmol/L   Potassium 4.4 3.5 - 5.1 mmol/L   Chloride 106 98 - 111 mmol/L   CO2 24 22 - 32 mmol/L   Glucose, Bld 277 (H) 70 - 99 mg/dL   BUN 15 8 - 23 mg/dL   Creatinine, Ser 5.401.02 0.61 - 1.24 mg/dL   Calcium 9.0 8.9 - 98.110.3 mg/dL   Total Protein 6.9 6.5 - 8.1 g/dL   Albumin 3.3 (L) 3.5 - 5.0 g/dL   AST 19 15 - 41 U/L   ALT 17 0 - 44 U/L   Alkaline Phosphatase 81 38 - 126 U/L   Total Bilirubin 0.9 0.3 - 1.2 mg/dL   GFR calc non Af Amer >60 >60 mL/min   GFR calc Af Amer >60 >60 mL/min   Anion gap 8 5 - 15    Comment: Performed at [facility], [address] [place], [place] [address]  CBC     Status: None   Collection Time: 09/30/18  3:37 AM  Result Value Ref Range   WBC 5.5 4.0 - 10.5 K/uL   RBC 4.86 4.22 - 5.81 MIL/uL   Hemoglobin 14.3 13.0 - 17.0 g/dL   HCT 82.942.3 56.239.0 - 13.052.0 %   MCV 87.0 80.0 - 100.0 fL   MCH 29.4 26.0 - 34.0 pg   MCHC 33.8 30.0 - 36.0 g/dL   RDW 86.513.0 78.411.5 - 69.615.5 %   Platelets 203 150 - 400 K/uL   nRBC 0.0 0.0 - 0.2 %    Comment: Performed at [facility], [address] [place], [place] [address]  Glucose, capillary     Status: Abnormal   Collection Time: 09/30/18  7:33 AM  Result Value Ref Range   Glucose-Capillary 222 (H) 70 - 99 mg/dL   Comment 1 Notify RN     No results found.  Review of Systems  Constitutional: Positive for malaise/fatigue. Negative for fever.  Respiratory: Negative for cough and shortness of breath.   Cardiovascular: Negative for chest pain and claudication.  Musculoskeletal:       Left foot pain   Blood pressure 115/69, pulse (!) 59, temperature 97.9 F (36.6 C), temperature source Oral, resp. rate 13, height 6\' 4"  (1.93 m), weight 84.1 kg, SpO2 97 %. Physical Exam  Nursing note and  vitals reviewed. Constitutional: He is oriented to person, place, and time. He appears well-developed and well-nourished.  Cardiovascular: Normal rate.  Palpable bilateral femoral pulses, pedal pulses diminished, +motor, diminished sensory-baseline  Respiratory: Effort normal and breath sounds normal.  GI: Soft. He exhibits no distension. There  is no abdominal tenderness.  Musculoskeletal:        General: Tenderness present. No edema.     Comments: LEFT: Amputation of 1st and 2nd toes, gangrenous wound.  Neurological: He is alert and oriented to person, place, and time.    Assessment/Plan: LEFT Non healing diabetic foot  wound with PAD; prior Iliac, SFA and popliteal stenting; tibial angioplasty in May 2019.  Will plan for angiogram tomorrow to assess stent patency and any required intervention.    Esco, Miechia A 09/30/2018, 9:33 AM

## 2018-09-30 NOTE — Plan of Care (Signed)
Pt. Is scheduled for I & D of foot wound today. Currently is receiving abx. Pain is controlled with IV and Po pain meds.

## 2018-10-01 ENCOUNTER — Encounter
Admission: AD | Disposition: A | Discharge status: Home Health | Payer: Self-pay | Source: Ambulatory Visit | Attending: Internal Medicine

## 2018-10-01 ENCOUNTER — Encounter: Payer: Self-pay | Admitting: Podiatry

## 2018-10-01 DIAGNOSIS — F17200 Nicotine dependence, unspecified, uncomplicated: Secondary | ICD-10-CM

## 2018-10-01 DIAGNOSIS — I70245 Atherosclerosis of native arteries of left leg with ulceration of other part of foot: Secondary | ICD-10-CM

## 2018-10-01 DIAGNOSIS — L97528 Non-pressure chronic ulcer of other part of left foot with other specified severity: Secondary | ICD-10-CM

## 2018-10-01 HISTORY — PX: LOWER EXTREMITY ANGIOGRAPHY: CATH118251

## 2018-10-01 LAB — BASIC METABOLIC PANEL
ANION GAP: 6 (ref 5–15)
BUN: 14 mg/dL (ref 8–23)
CO2: 24 mmol/L (ref 22–32)
Calcium: 9.1 mg/dL (ref 8.9–10.3)
Chloride: 106 mmol/L (ref 98–111)
Creatinine, Ser: 0.91 mg/dL (ref 0.61–1.24)
GFR calc Af Amer: >60 mL/min (ref >60)
GFR calc non Af Amer: >60 mL/min (ref >60)
Glucose, Bld: 179 mg/dL — ABNORMAL HIGH (ref 70–99)
Potassium: 4.5 mmol/L (ref 3.5–5.1)
Sodium: 136 mmol/L (ref 135–145)

## 2018-10-01 LAB — GLUCOSE, CAPILLARY
Glucose-Capillary: 131 mg/dL — ABNORMAL HIGH (ref 70–99)
Glucose-Capillary: 163 mg/dL — ABNORMAL HIGH (ref 70–99)
Glucose-Capillary: 188 mg/dL — ABNORMAL HIGH (ref 70–99)
Glucose-Capillary: 173 mg/dL — ABNORMAL HIGH (ref 70–99)
Glucose-Capillary: 197 mg/dL — ABNORMAL HIGH (ref 70–99)

## 2018-10-01 LAB — CBC
HCT: 43.6 % (ref 39.0–52.0)
Hemoglobin: 14.4 g/dL (ref 13.0–17.0)
MCH: 29.5 pg (ref 26.0–34.0)
MCHC: 33 g/dL (ref 30.0–36.0)
MCV: 89.3 fL (ref 80.0–100.0)
Platelets: 125 10*3/uL — ABNORMAL LOW (ref 150–400)
RBC: 4.88 MIL/uL (ref 4.22–5.81)
RDW: 13 % (ref 11.5–15.5)
WBC: 5.4 10*3/uL (ref 4.0–10.5)
nRBC: 0 % (ref 0.0–0.2)

## 2018-10-01 LAB — VANCOMYCIN, TROUGH: Vancomycin Tr: 15 ug/mL (ref 15–20)

## 2018-10-01 LAB — HEMOGLOBIN A1C
Hgb A1c MFr Bld: 8.1 % — ABNORMAL HIGH (ref 4.8–5.6)
Mean Plasma Glucose: 186 mg/dL

## 2018-10-01 SURGERY — LOWER EXTREMITY ANGIOGRAPHY
Anesthesia: Moderate Sedation | Laterality: Left

## 2018-10-01 MED ORDER — IOPAMIDOL (ISOVUE-300) INJECTION 61%
INTRAVENOUS | Status: DC | PRN
  Administered 2018-10-01: 110 mL via INTRA_ARTERIAL

## 2018-10-01 MED ORDER — SODIUM CHLORIDE 0.9 % IV SOLN
2.0000 g | Freq: Three times a day (TID) | INTRAVENOUS | Status: DC
  Administered 2018-10-01 – 2018-10-04 (×9): 2 g via INTRAVENOUS
  Filled 2018-10-01 (×13): qty 2

## 2018-10-01 MED ORDER — HEPARIN (PORCINE) IN NACL 1000-0.9 UT/500ML-% IV SOLN
INTRAVENOUS | Status: AC
  Filled 2018-10-01: qty 1000

## 2018-10-01 MED ORDER — SODIUM CHLORIDE (PF) 0.9 % IJ SOLN
INTRAMUSCULAR | Status: AC
  Filled 2018-10-01: qty 50

## 2018-10-01 MED ORDER — CLOPIDOGREL BISULFATE 75 MG PO TABS
150.0000 mg | ORAL_TABLET | Freq: Once | ORAL | Status: AC
  Administered 2018-10-01: 150 mg via ORAL
  Filled 2018-10-01: qty 2

## 2018-10-01 MED ORDER — LIDOCAINE-EPINEPHRINE (PF) 1 %-1:200000 IJ SOLN
INTRAMUSCULAR | Status: AC
  Filled 2018-10-01: qty 10

## 2018-10-01 MED ORDER — NITROGLYCERIN 5 MG/ML IV SOLN
INTRAVENOUS | Status: AC
  Filled 2018-10-01: qty 10

## 2018-10-01 MED ORDER — HEPARIN SODIUM (PORCINE) 1000 UNIT/ML IJ SOLN
INTRAMUSCULAR | Status: AC
  Filled 2018-10-01: qty 1

## 2018-10-01 MED ORDER — PREMIER PROTEIN SHAKE
11.0000 [oz_av] | Freq: Two times a day (BID) | ORAL | Status: DC
  Administered 2018-10-02 – 2018-10-04 (×5): 11 [oz_av] via ORAL

## 2018-10-01 MED ORDER — MUPIROCIN 2 % EX OINT
TOPICAL_OINTMENT | Freq: Every day | CUTANEOUS | Status: DC
  Administered 2018-10-01 – 2018-10-02 (×2): via NASAL
  Filled 2018-10-01: qty 22

## 2018-10-01 MED ORDER — NITROGLYCERIN 1 MG/10 ML FOR IR/CATH LAB
INTRA_ARTERIAL | Status: DC | PRN
  Administered 2018-10-01: 250 ug via INTRA_ARTERIAL

## 2018-10-01 MED ORDER — FENTANYL CITRATE (PF) 100 MCG/2ML IJ SOLN
INTRAMUSCULAR | Status: AC
  Filled 2018-10-01: qty 2

## 2018-10-01 MED ORDER — HEPARIN SODIUM (PORCINE) 1000 UNIT/ML IJ SOLN
INTRAMUSCULAR | Status: DC | PRN
  Administered 2018-10-01: 5000 [IU] via INTRAVENOUS

## 2018-10-01 MED ORDER — ASPIRIN EC 81 MG PO TBEC
81.0000 mg | DELAYED_RELEASE_TABLET | Freq: Every day | ORAL | Status: DC
  Administered 2018-10-01 – 2018-10-04 (×4): 81 mg via ORAL
  Filled 2018-10-01 (×4): qty 1

## 2018-10-01 MED ORDER — OCUVITE-LUTEIN PO CAPS
1.0000 | ORAL_CAPSULE | Freq: Every day | ORAL | Status: DC
  Administered 2018-10-02 – 2018-10-03 (×2): 1 via ORAL
  Filled 2018-10-01 (×3): qty 1

## 2018-10-01 MED ORDER — FENTANYL CITRATE (PF) 100 MCG/2ML IJ SOLN
INTRAMUSCULAR | Status: DC | PRN
  Administered 2018-10-01: 50 ug via INTRAVENOUS
  Administered 2018-10-01 (×2): 25 ug via INTRAVENOUS

## 2018-10-01 MED ORDER — CLOPIDOGREL BISULFATE 75 MG PO TABS
75.0000 mg | ORAL_TABLET | Freq: Every day | ORAL | Status: DC
  Administered 2018-10-02 – 2018-10-04 (×3): 75 mg via ORAL
  Filled 2018-10-01 (×4): qty 1

## 2018-10-01 MED ORDER — CEFAZOLIN SODIUM-DEXTROSE 2-4 GM/100ML-% IV SOLN: 2.0000 g | INTRAVENOUS | Status: DC

## 2018-10-01 MED ORDER — MIDAZOLAM HCL 5 MG/5ML IJ SOLN
INTRAMUSCULAR | Status: AC
  Filled 2018-10-01: qty 5

## 2018-10-01 MED ORDER — CEFAZOLIN SODIUM-DEXTROSE 2-4 GM/100ML-% IV SOLN: 2.0000 g | Freq: Once | INTRAVENOUS | Status: DC

## 2018-10-01 MED ORDER — MIDAZOLAM HCL 2 MG/2ML IJ SOLN
INTRAMUSCULAR | Status: DC | PRN
  Administered 2018-10-01: 2 mg via INTRAVENOUS
  Administered 2018-10-01: 1 mg via INTRAVENOUS
  Administered 2018-10-01: 2 mg via INTRAVENOUS

## 2018-10-01 MED ORDER — SODIUM CHLORIDE 0.9 % IV SOLN: INTRAVENOUS | Status: DC

## 2018-10-01 SURGICAL SUPPLY — 27 items
BALLN LUTONIX 018 4X150X130 (BALLOONS) ×3
BALLN LUTONIX 018 5X220X130 (BALLOONS) ×3
BALLN LUTONIX 5X220X130 (BALLOONS) ×3
BALLN ULTRVRSE 2.5X300X150 (BALLOONS) ×3
BALLN ULTRVRSE 3X300X150 (BALLOONS) ×2
BALLN ULTRVRSE 3X300X150 OTW (BALLOONS) ×1
BALLOON LUTONIX 018 4X150X130 (BALLOONS) ×1 IMPLANT
BALLOON LUTONIX 018 5X220X130 (BALLOONS) ×1 IMPLANT
BALLOON LUTONIX 5X220X130 (BALLOONS) ×1 IMPLANT
BALLOON ULTRVRSE 2.5X300X150 (BALLOONS) ×1 IMPLANT
BALLOON ULTRVRSE 3X300X150 OTW (BALLOONS) ×1 IMPLANT
CATH BEACON 5 .038 100 VERT TP (CATHETERS) ×3 IMPLANT
CATH CXI SUPP ANG 4FR 135 (CATHETERS) ×1 IMPLANT
CATH CXI SUPP ANG 4FR 135CM (CATHETERS) ×3
CATH PIG 70CM (CATHETERS) ×3 IMPLANT
DEVICE PRESTO INFLATION (MISCELLANEOUS) ×3 IMPLANT
DEVICE STARCLOSE SE CLOSURE (Vascular Products) ×3 IMPLANT
GLIDEWIRE ADV .035X260CM (WIRE) ×3 IMPLANT
GUIDEWIRE PFTE-COATED .018X300 (WIRE) ×3 IMPLANT
PACK ANGIOGRAPHY (CUSTOM PROCEDURE TRAY) ×3 IMPLANT
SHEATH ANL2 6FRX45 HC (SHEATH) ×3 IMPLANT
SHEATH BRITE TIP 5FRX11 (SHEATH) ×3 IMPLANT
STENT VIABAHN 6X250X120 (Permanent Stent) ×6 IMPLANT
STENT VIABAHN 6X50X120 (Permanent Stent) ×3 IMPLANT
TUBING CONTRAST HIGH PRESS 72 (TUBING) ×3 IMPLANT
WIRE G V18X300CM (WIRE) ×3 IMPLANT
WIRE J 3MM .035X145CM (WIRE) ×3 IMPLANT

## 2018-10-01 NOTE — Consult Note (Signed)
Pharmacy Antibiotic Note  Eddie DibblesHosea D Stidham is a 62 y.o. male admitted on 09/29/2018 with cellulitis.  Pharmacy has been consulted for vancomycin and cefepime dosing. [facility] earlier in the year with vancomycin. That dosing information and level was used to prepare this note.  Plan: Start Cefepime 2g q8h  Continue Vancomycin 1000mg  IV every 8 hours  VT drawn 12/25 2359 was 15 ug/ml - at goal  Continue to monitor Scr at least every 3 days for any major changes  Draw another VT if Scr fluctuates    Ke: 0.099  T1/2: 6.9h  Vd: 59L  Css: 29.4/14.6 mcg/mL  Goal trough 15-20 mcg/mL  Vt prior to 5th dose  Height: 6\' 4"  (193 cm) Weight: 185 lb 6.5 oz (84.1 kg) IBW/kg (Calculated) : 86.8  Temp (24hrs), Avg:97.6 F (36.4 C), Min:96.9 F (36.1 C), Max:98.3 F (36.8 C)  Antimicrobials this admission: vancomycin 12/24 >>   Microbiology results: 12/24 MRSA PCR: pending  Thank you for allowing pharmacy to be a part of this patient's care.  Albina Billetharles M Anaissa Macfadden, PharmD, BCPS Clinical Pharmacist 10/01/2018 8:57 AM

## 2018-10-01 NOTE — Progress Notes (Signed)
Day of Surgery   Subjective/Chief Complaint: Patient seen.  Complains of some stinging on the top of his foot.  Thinks his bandages too tight.   Objective: Vital signs in last 24 hours: Temp:  [97.3 F (36.3 C)-98.1 F (36.7 C)] 97.8 F (36.6 C) (12/26 1059) Pulse Rate:  [60-94] 84 (12/26 1059) Resp:  [12-19] 12 (12/26 1059) BP: (99-138)/(61-92) 138/75 (12/26 1059) SpO2:  [98 %-100 %] 100 % (12/26 1059) Weight:  [84.1 kg] 84.1 kg (12/26 0818) Last BM Date: 09/29/18  Intake/Output from previous day: 12/25 0701 - 12/26 0700 In: 590 [P.O.:240; I.V.:350] Out: 2655 [Urine:2650; Blood:5] Intake/Output this shift: Total I/O In: 240 [P.O.:240] Out: 800 [Urine:800]  The bandage is dry and intact on the left foot.  No evidence of any strikethrough.  Had his vascular procedure earlier this morning.  Lab Results:  Recent Labs    09/30/18 0337 09/30/18 2329  WBC 5.5 5.4  HGB 14.3 14.4  HCT 42.3 43.6  PLT 203 125*   BMET Recent Labs    09/30/18 0337 09/30/18 2329  NA 138 136  K 4.4 4.5  CL 106 106  CO2 24 24  GLUCOSE 277* 179*  BUN 15 14  CREATININE 1.02 0.91  CALCIUM 9.0 9.1   PT/INR No results for input(s): LABPROT, INR in the last 72 hours. ABG No results for input(s): PHART, HCO3 in the last 72 hours.  Invalid input(s): PCO2, PO2  Studies/Results: No results found.  Anti-infectives: Anti-infectives (From admission, onward)   Start     Dose/Rate Route Frequency Ordered Stop   10/01/18 1200  ceFEPIme (MAXIPIME) 2 g in sodium chloride 0.9 % 100 mL IVPB     2 g 200 mL/hr over 30 Minutes Intravenous Every 8 hours 10/01/18 0857     10/01/18 1015  ceFAZolin (ANCEF) IVPB 2g/100 mL premix  Status:  Discontinued     2 g 200 mL/hr over 30 Minutes Intravenous  Once 10/01/18 1007 10/01/18 1009   10/01/18 1007  ceFAZolin (ANCEF) IVPB 2g/100 mL premix  Status:  Discontinued     2 g 200 mL/hr over 30 Minutes Intravenous 30 min pre-op 10/01/18 1007 10/01/18 1058   09/30/18 0000  vancomycin (VANCOCIN) IVPB 1000 mg/200 mL premix     1,000 mg 200 mL/hr over 60 Minutes Intravenous Every 8 hours 09/29/18 1715     09/29/18 1700  vancomycin (VANCOCIN) IVPB 1000 mg/200 mL premix     1,000 mg 200 mL/hr over 60 Minutes Intravenous  Once 09/29/18 1607 09/29/18 1854      Assessment/Plan: s/p Procedure(s): Lower Extremity Angiography (Left) Assessment: Status post debridement left foot.   Plan: Dressing left intact on the left.  Begin daily bandage change for the ulceration on the right foot.  Plan for a dressing change on his surgical foot tomorrow.  LOS: 2 days    Ricci Barkerodd W Tylique Aull 10/01/2018

## 2018-10-01 NOTE — Care Management (Signed)
RNCM left home health agency list at bedside per [email or web address]. Patient is currently off unit. RNCM to follow.

## 2018-10-01 NOTE — Progress Notes (Signed)
Initial Nutrition Assessment  DOCUMENTATION CODES:   Non-severe (moderate) malnutrition in context of chronic illness  INTERVENTION:  Recommend removing heart healthy diet restrictions and instead placing patient on carbohydrate-modified diet.  Provide Premier Protein po BID, each supplement provides 160 kcal and 30 grams of protein.  Provide Ocuvite daily for wound healing (provides zinc, vitamin A, vitamin C, Vitamin E, copper, and selenium).  Encouraged adequate intake of protein and calories at meals.  NUTRITION DIAGNOSIS:   Moderate Malnutrition related to chronic illness(IDDM, PVD, wounds to bilateral feet) as evidenced by moderate fat depletion, moderate muscle depletion.  GOAL:   Patient will meet greater than or equal to 90% of their needs  MONITOR:   PO intake, Supplement acceptance, Labs, Weight trends, Skin, I & O's  REASON FOR ASSESSMENT:   Malnutrition Screening Tool    ASSESSMENT:   62 year old male with PMHx of IDDM admitted with bilateral lower extremity cellulitis with worsening of left foot wound with gangrenous changes s/p left foot debridement on 12/25, PVD.   Met with patient at bedside. He was in a lot of pain and unable to stay still very long during RD assessment. He reports his appetite is good and he is finishing 100% of his meals here. He also eats 100% of 3 meals per day PTA. He is amenable to drinking ONS to help meet increased protein needs.  Patient reports his UBW is 190 lbs+ (86.4 kg). Weights in chart are variable. He has been 83-99.8 kg over the past few months, which is highly unlikely. There is muscle and fat depletion present on NFPE (below), which is concerning for weight loss.  Meal Completion: 100%  Medications reviewed and include: Colace 100 mg BID, Novolog 0-9 units TID, Novolog 0-5 units QHS, lisinopril, nicotine patch, cefepime, vancomycin.  Labs reviewed: CBG 131-211.  NUTRITION - FOCUSED PHYSICAL EXAM:    Most Recent  Value  Orbital Region  Moderate depletion  Upper Arm Region  Severe depletion  Thoracic and Lumbar Region  Moderate depletion  Buccal Region  Moderate depletion  Temple Region  Moderate depletion  Clavicle Bone Region  Moderate depletion  Clavicle and Acromion Bone Region  Moderate depletion  Scapular Bone Region  Moderate depletion  Dorsal Hand  Moderate depletion  Patellar Region  Severe depletion  Anterior Thigh Region  Severe depletion  Posterior Calf Region  Severe depletion  Edema (RD Assessment)  None  Hair  Reviewed  Eyes  Reviewed  Mouth  Reviewed  Skin  Reviewed  Nails  Reviewed     Diet Order:   Diet Order            Diet Heart Room service appropriate? Yes; Fluid consistency: Thin  Diet effective now             EDUCATION NEEDS:   Education needs have been addressed  Skin:  Skin Assessment: Skin Integrity Issues:(diabetic foot ulcers to bilateral feet)  Last BM:  09/29/2018  Height:   Ht Readings from Last 1 Encounters:  10/01/18 '6\' 4"'  (1.93 m)   Weight:   Wt Readings from Last 1 Encounters:  10/01/18 84.1 kg   Ideal Body Weight:  91.8 kg  BMI:  Body mass index is 22.57 kg/m.  Estimated Nutritional Needs:   Kcal:  2090-2260 (MSJ x 1.2-1.3)  Protein:  100-115 grams (1.2-1.4 grams/kg)  Fluid:  2-2.2 L/day (1 mL/kcal)  Willey Blade, MS, RD, LDN Office: [phone] Pager: [phone] After Hours/Weekend Pager: [phone]

## 2018-10-01 NOTE — Progress Notes (Signed)
Dr. Wyn Quakerew at bedside, speaking with pt. Re: procedural results and need to stop smoking. Pt. Verbalized understanding

## 2018-10-01 NOTE — Op Note (Signed)
[facility]  Percutaneous Study/Intervention Procedural Note   Date of Surgery: 10/01/2018  Surgeon(s):DEW,JASON    Assistants:none  Pre-operative Diagnosis: PAD with ulceration and infection left foot  Post-operative diagnosis:  Same  Procedure(s) Performed:             1.  Ultrasound guidance for vascular access right femoral artery             2.  Catheter placement into left common femoral artery from right femoral approach             3.  Aortogram and selective left lower extremity angiogram including selective imaging of the anterior tibial and posterior tibial arteries             4.  Percutaneous transluminal angioplasty of the left anterior tibial artery with 2.5 mm diameter by 30 cm length angioplasty balloon             5.   Percutaneous transluminal angioplasty of the entire left SFA and popliteal artery with two 5 mm diameter by 22 cm length [name] drug-coated angioplasty balloons  6.  Percutaneous transluminal angioplasty of the left tibioperoneal trunk in the proximal to mid posterior tibial artery with 3 mm diameter by 30 cm length angioplasty balloon and then the tibioperoneal trunk and proximal posterior tibial artery with a 4 mm diameter by 15 cm length [name] drug-coated angioplasty balloon  7.  Viabahn stent placement x3 to the left SFA and popliteal arteries for residual stenosis after angioplasty.  These were 6 mm in diameter and 2 were 25 cm in length with the most proximal stent being 5 cm in length             8.  StarClose closure device right femoral artery  EBL: 10 cc  Contrast: 110 cc  Fluoro Time: 14.1 minutes  Moderate Conscious Sedation Time: approximately 60 minutes using 5 mg of Versed and 100 mcg of Fentanyl              Indications:  Patient is a 62 y.o.male with infected ulceration of the left foot. The patient has a history of PAD but no recent studies and continued tobacco use. The patient is brought in for  angiography for further evaluation and potential treatment.  Due to the limb threatening nature of the situation, angiogram was performed for attempted limb salvage. The patient is aware that if the procedure fails, amputation would be expected.  The patient also understands that even with successful revascularization, amputation may still be required due to the severity of the situation. Risks and benefits are discussed and informed consent is obtained.   Procedure:  The patient was identified and appropriate procedural time out was performed.  The patient was then placed supine on the table and prepped and draped in the usual sterile fashion. Moderate conscious sedation was administered during a face to face encounter with the patient throughout the procedure with my supervision of the RN administering medicines and monitoring the patient's vital signs, pulse oximetry, telemetry and mental status throughout from the start of the procedure until the patient was taken to the recovery room. Ultrasound was used to evaluate the right common femoral artery.  It was patent .  A digital ultrasound image was acquired.  A Seldinger needle was used to access the right common femoral artery under direct ultrasound guidance and a permanent image was performed.  A 0.035 J wire was advanced without resistance and a 5Fr sheath  was placed.  Pigtail catheter was placed into the aorta and an AP aortogram was performed. This demonstrated normal renal arteries and normal aorta and iliac segments without significant stenosis. I then crossed the aortic bifurcation and advanced to the left femoral head. Selective left lower extremity angiogram was then performed. This demonstrated normal common femoral artery and profunda femoris artery.  There is a near flush occlusion of the left SFA with occlusion of the previously placed SFA and popliteal stents.  There is reconstitution of the proximal to mid posterior tibial artery after  occlusion of the tibioperoneal trunk and proximal posterior tibial artery.  The peroneal artery is small and diseased.  The anterior tibial artery also has a long segment of occlusive lesion and does not appear to be continuous into the foot. It was felt that it was in the patient's best interest to proceed with intervention after these images to avoid a second procedure and a larger amount of contrast and fluoroscopy based off of the findings from the initial angiogram. The patient was systemically heparinized and a 6 [place] Ansell sheath was then placed over the [name] wire. I then used a Kumpe catheter and the advantage wire to navigate through the SFA and popliteal occlusions with minimal difficulty and intraluminal flow was confirmed in the below-knee popliteal artery although there remained occlusive disease in all 3 tibial vessels.  Initially, using the 0.035 advantage wire and a V 18 wire I was only able to gain access to the anterior tibial artery.  A wire was taken all the way down to the ankle at the anterior tibial artery although the vessel occluded just above the ankle.  It had multiple areas of high-grade stenosis as well as the proximal occlusion on selective imaging.  I used a 2.5 mm diameter by 30 cm length angioplasty balloon and inflated from the mid to distal anterior tibial artery up to its origin.  This was taken to 12 atm for 1 minute.  This remained occluded distally with some areas of residual narrowing in proximal and mid segments, but now was able to navigate a 0.018 advantage wire with a CXI catheter down into the tibioperoneal trunk.  Selective imaging help guide [place] to the posterior tibial artery and across the posterior tibial artery occlusion and confirmed intraluminal flow in the mid to distal posterior tibial artery.  The wire was then replaced and I proceeded with treatment.  A 3 mm diameter by 30 cm length angioplasty balloon was inflated from the mid to distal posterior  tibial artery up through the tibioperoneal trunk and distal popliteal artery.  I then used 2 drug-coated angioplasty balloons to treat the SFA and popliteal arteries.  Both were 5 mm in diameter and 22 cm in length and a third inflation was required in the midsegment from the second balloon.  Each inflation was 10 to 12 atm for 1 minute.  Completion imaging showed diffuse disease with some thrombotic components and multiple areas of greater than 50% residual stenosis throughout the SFA and popliteal arteries.  The tibioperoneal trunk and proximal posterior tibial artery continued to have high-grade stenosis.  I used a 4 mm diameter by 15 cm length [name] drug-coated angioplasty balloon in the proximal left posterior tibial artery and tibioperoneal trunk and inflated this to 6 atm for 1 minute.  Covered stents were then placed across the SFA and popliteal arteries from the origin of the SFA to the below-knee popliteal artery.  This required two 6 mm  diameter by 25 cm length stents and the most proximal stent was 6 mm in diameter by 5 cm in length.  These were postdilated with 5 mm balloons with less than 10% residual stenosis in the SFA and popliteal arteries.  The tibioperoneal trunk and proximal posterior tibial artery still had residual narrowing in the 40% range but this did not appear high-grade and the flow was markedly improved. I elected to terminate the procedure. The sheath was removed and StarClose closure device was deployed in the right femoral artery with excellent hemostatic result. The patient was taken to the recovery room in stable condition having tolerated the procedure well.  Findings:               Aortogram:  The renal arteries appear to be widely patent.  The aorta and iliac arteries are patent without significant stenosis.  He has 3 previous iliac stents, 2 on the right and 1 on the left all of which are widely patent.             Left lower Extremity:  Reasonably normal common femoral  artery and profunda femoris artery.  There is a near flush occlusion of the left SFA with occlusion of the previously placed SFA and popliteal stents.  There is reconstitution of the proximal to mid posterior tibial artery after occlusion of the tibioperoneal trunk and proximal posterior tibial artery.  The peroneal artery is small and diseased.  The anterior tibial artery also has a long segment of occlusive lesion and does not appear to be continuous into the foot.   Disposition: Patient was taken to the recovery room in stable condition having tolerated the procedure well.  Complications: None  Austin [name] 10/01/2018 10:08 AM   This note was created with Dragon Medical transcription system. Any errors in dictation are purely unintentional.

## 2018-10-01 NOTE — Progress Notes (Signed)
[facility] [place] at [facility]   PATIENT NAME: Austin EvenerHosea [name]    MR#:  [id number]  DATE OF BIRTH:  09/24/1956  SUBJECTIVE:  CHIEF COMPLAINT:  Pt seen today after sx, resting ok, denies pain at this time   REVIEW OF SYSTEMS:  CONSTITUTIONAL: No fever, fatigue or weakness.  EYES: No blurred or double vision.  EARS, NOSE, AND THROAT: No tinnitus or ear pain.  RESPIRATORY: No cough, shortness of breath, wheezing or hemoptysis.  CARDIOVASCULAR: No chest pain, orthopnea, edema.  GASTROINTESTINAL: No nausea, vomiting, diarrhea or abdominal pain.  GENITOURINARY: No dysuria, hematuria.  ENDOCRINE: No polyuria, nocturia,  HEMATOLOGY: No anemia, easy bruising or bleeding SKIN: No rash or lesion. MUSCULOSKELETAL: No joint pain or arthritis.   NEUROLOGIC: No tingling, numbness, weakness.  PSYCHIATRY: No anxiety or depression.   DRUG ALLERGIES:  No Known Allergies  VITALS:  Blood pressure 138/75, pulse 84, temperature 97.8 F (36.6 C), temperature source Oral, resp. rate 12, height 6\' 4"  (1.93 m), weight 84.1 kg, SpO2 100 %.  PHYSICAL EXAMINATION:  GENERAL:  62 y.o.-year-old patient lying in the bed with no acute distress.  EYES: Pupils equal, round, reactive to light and accommodation. No scleral icterus. Extraocular muscles intact.  HEENT: Head atraumatic, normocephalic. Oropharynx and nasopharynx clear.  NECK:  Supple, no jugular venous distention. No thyroid enlargement, no tenderness.  LUNGS: Normal breath sounds bilaterally, no wheezing, rales,rhonchi or crepitation. No use of accessory muscles of respiration.  CARDIOVASCULAR: S1, S2 normal. No murmurs, rubs, or gallops.  ABDOMEN: Soft, nontender, nondistended. Bowel sounds present. No organomegaly or mass.  EXTREMITIES: No pedal edema, cyanosis, or clubbing.  NEUROLOGIC: Cranial nerves II through XII are intact. Muscle strength 5/5 in all extremities. Sensation intact. Gait not checked.  PSYCHIATRIC:  The patient is alert and oriented x 3.  SKIN: No obvious rash, lesion, or ulcer.    LABORATORY PANEL:   CBC Recent Labs  Lab 09/30/18 2329  WBC 5.4  HGB 14.4  HCT 43.6  PLT 125*   ------------------------------------------------------------------------------------------------------------------  Chemistries  Recent Labs  Lab 09/30/18 0337 09/30/18 2329  NA 138 136  K 4.4 4.5  CL 106 106  CO2 24 24  GLUCOSE 277* 179*  BUN 15 14  CREATININE 1.02 0.91  CALCIUM 9.0 9.1  AST 19  --   ALT 17  --   ALKPHOS 81  --   BILITOT 0.9  --    ------------------------------------------------------------------------------------------------------------------  Cardiac Enzymes No results for input(s): TROPONINI in the last 168 hours. ------------------------------------------------------------------------------------------------------------------  RADIOLOGY:  No results found.  EKG:  No orders found for this or any previous visit.  ASSESSMENT AND PLAN:     #Bilateral lower extremity cellulitis with worsening of the left foot wound with gangrenous changes Podiatry consult placed to Dr. Alberteen Spindleline status post left foot debridement 09/30/2018 Left foot wound culture with gram-positive cocci and gram-negative rods -patient on IV vancomycin, cefepime Dressing left intact on the left.  Begin daily bandage change for the ulceration on the right foot.  Plan for a dressing change on his surgical foot tomorrow by podiatry Pain management as needed   #Peripheral vascular disease Vascular surgery consult placed to Dr. Evie LacksEsco, planning to do angiogram tomorrow Patient is status post prior iliac, SFA and popliteal stenting and tibial angioplasty in May 2019 10/01/18-Percutaneous transluminal angioplasty of the entire left SFA and popliteal artery with two  drug-coated angioplasty balloons   Percutaneous transluminal angioplasty of the left tibioperoneal trunk in the proximal  to mid posterior  tibial artery with angioplasty balloon and then the tibioperoneal trunk and proximal posterior tibial artery with [name] drug-coated angioplasty balloon;  Viabahn stent placement x3 to the left SFA and popliteal arteries for residual stenosis after angioplasty.    #Insulin requiring diabetes mellitus Resume Lantus and provide sliding scale insulin diabetic diet  #History of DVT hold Eliquis and Plavix for now Plan is to resume Eliquis if ok with vascular and podiatry  All the records are reviewed and case discussed with Care Management/Social Workerr. Management plans discussed with the patient, family and they are in agreement.  CODE STATUS: fc  TOTAL TIME TAKING CARE OF THIS PATIENT: 35 minutes.   POSSIBLE D/C IN 1- 2  DAYS, DEPENDING ON CLINICAL CONDITION.  Note: This dictation was prepared with Dragon dictation along with smaller phrase technology. Any transcriptional errors that result from this process are unintentional.   [name] M.D on 10/01/2018 at 2:20 PM  Between 7am to 6pm - Pager - [phone] After 6pm go to [email or web address] - password [email or web address] [facility]  [facility]  Office  [phone]  CC: Primary care physician; [name], [name], MD

## 2018-10-01 NOTE — Consult Note (Signed)
Pharmacy Antibiotic Note  Eddie DibblesHosea D Cornick is a 62 y.o. male admitted on 09/29/2018 with cellulitis.  Pharmacy has been consulted for vancomycin dosing. He has been [facility] earlier in the year with vancomycin. That dosing information and level was used to prepare this note.  Plan: Continue Vancomycin 1000mg  IV every 8 hours  VT drawn 12/25 2359 was 15 ug/ml - at goal  Continue to monitor Scr at least every 3 days for any major changes  Draw another VT if Scr fluctuates    Ke: 0.099  T1/2: 6.9h  Vd: 59L  Css: 29.4/14.6 mcg/mL  Goal trough 15-20 mcg/mL  Vt prior to 5th dose  Height: 6\' 4"  (193 cm) Weight: 185 lb 6.5 oz (84.1 kg) IBW/kg (Calculated) : 86.8  Temp (24hrs), Avg:97.7 F (36.5 C), Min:96.9 F (36.1 C), Max:98.3 F (36.8 C)  Antimicrobials this admission: vancomycin 12/24 >>   Microbiology results: 12/24 MRSA PCR: pending  Thank you for allowing pharmacy to be a part of this patient's care.  Albina Billetharles M Norinne Jeane, PharmD, BCPS Clinical Pharmacist 10/01/2018 7:12 AM

## 2018-10-01 NOTE — H&P (Signed)
[facility] History & Physical Update  The patient was interviewed and re-examined.  [name] previous History and Physical has been reviewed and is unchanged.  There is no change in the plan of care. We plan to proceed with the scheduled procedure.  Gennavieve Huq, MD  10/01/2018, 8:17 AM    

## 2018-10-02 DIAGNOSIS — E440 Moderate protein-calorie malnutrition: Secondary | ICD-10-CM

## 2018-10-02 LAB — CREATININE, SERUM
Creatinine, Ser: 0.94 mg/dL (ref 0.61–1.24)
GFR calc Af Amer: >60 mL/min (ref >60)
GFR calc non Af Amer: >60 mL/min (ref >60)

## 2018-10-02 LAB — GLUCOSE, CAPILLARY
Glucose-Capillary: 195 mg/dL — ABNORMAL HIGH (ref 70–99)
Glucose-Capillary: 157 mg/dL — ABNORMAL HIGH (ref 70–99)
Glucose-Capillary: 209 mg/dL — ABNORMAL HIGH (ref 70–99)
Glucose-Capillary: 243 mg/dL — ABNORMAL HIGH (ref 70–99)

## 2018-10-02 LAB — SURGICAL PATHOLOGY

## 2018-10-02 MED ORDER — APIXABAN 5 MG PO TABS
5.0000 mg | ORAL_TABLET | Freq: Two times a day (BID) | ORAL | Status: DC
  Administered 2018-10-02 – 2018-10-04 (×5): 5 mg via ORAL
  Filled 2018-10-02 (×5): qty 1

## 2018-10-02 MED ORDER — INSULIN ASPART 100 UNIT/ML ~~LOC~~ SOLN
0.0000 [IU] | Freq: Three times a day (TID) | SUBCUTANEOUS | Status: DC
  Administered 2018-10-02: 5 [IU] via SUBCUTANEOUS
  Administered 2018-10-03: 8 [IU] via SUBCUTANEOUS
  Administered 2018-10-03 – 2018-10-04 (×3): 3 [IU] via SUBCUTANEOUS
  Filled 2018-10-02 (×5): qty 1

## 2018-10-02 MED ORDER — POLYETHYLENE GLYCOL 3350 17 G PO PACK
17.0000 g | PACK | Freq: Every day | ORAL | Status: DC
  Administered 2018-10-02 – 2018-10-04 (×3): 17 g via ORAL
  Filled 2018-10-02 (×2): qty 1

## 2018-10-02 NOTE — Care Management Important Message (Signed)
Important Message  Patient Details  Name: Austin [name] MRN: [id number] Date of Birth: 03/02/1956   Medicare Important Message Given:  Yes    Olegario MessierKathy A Ivana Nicastro 10/02/2018, 9:42 AM

## 2018-10-02 NOTE — Progress Notes (Signed)
1 Day Post-Op   Subjective/Chief Complaint: Patient seen.  Relates severe pain last night.  Kept him awake and states it made him cry.   Objective: Vital signs in last 24 hours: Temp:  [97.7 F (36.5 C)-102.4 F (39.1 C)] 98.4 F (36.9 C) (12/27 0837) Pulse Rate:  [76-132] 102 (12/27 0727) Resp:  [12-20] 19 (12/27 0356) BP: (105-176)/(53-92) 114/58 (12/27 0727) SpO2:  [97 %-100 %] 97 % (12/27 0727) Last BM Date: 09/29/18  Intake/Output from previous day: 12/26 0701 - 12/27 0700 In: 1431.6 [P.O.:480; IV Piggyback:951.6] Out: 2110 [Urine:2110] Intake/Output this shift: Total I/O In: -  Out: 400 [Urine:400]  Bandage on the left foot is dry and intact.  Some dried and active bleeding noted on the bandaging.  Upon removal the incision is well coapted and the erythema and edema in the foot have significantly improved.  No signs of purulence from the incision area.  Lab Results:  Recent Labs    09/30/18 0337 09/30/18 2329  WBC 5.5 5.4  HGB 14.3 14.4  HCT 42.3 43.6  PLT 203 125*   BMET Recent Labs    09/30/18 0337 09/30/18 2329 10/02/18 0317  NA 138 136  --   K 4.4 4.5  --   CL 106 106  --   CO2 24 24  --   GLUCOSE 277* 179*  --   BUN 15 14  --   CREATININE 1.02 0.91 0.94  CALCIUM 9.0 9.1  --    PT/INR No results for input(s): LABPROT, INR in the last 72 hours. ABG No results for input(s): PHART, HCO3 in the last 72 hours.  Invalid input(s): PCO2, PO2  Studies/Results: No results found.  Anti-infectives: Anti-infectives (From admission, onward)   Start     Dose/Rate Route Frequency Ordered Stop   10/01/18 1200  ceFEPIme (MAXIPIME) 2 g in sodium chloride 0.9 % 100 mL IVPB     2 g 200 mL/hr over 30 Minutes Intravenous Every 8 hours 10/01/18 0857     10/01/18 1015  ceFAZolin (ANCEF) IVPB 2g/100 mL premix  Status:  Discontinued     2 g 200 mL/hr over 30 Minutes Intravenous  Once 10/01/18 1007 10/01/18 1009   10/01/18 1007  ceFAZolin (ANCEF) IVPB 2g/100 mL  premix  Status:  Discontinued     2 g 200 mL/hr over 30 Minutes Intravenous 30 min pre-op 10/01/18 1007 10/01/18 1058   09/30/18 0000  vancomycin (VANCOCIN) IVPB 1000 mg/200 mL premix     1,000 mg 200 mL/hr over 60 Minutes Intravenous Every 8 hours 09/29/18 1715     09/29/18 1700  vancomycin (VANCOCIN) IVPB 1000 mg/200 mL premix     1,000 mg 200 mL/hr over 60 Minutes Intravenous  Once 09/29/18 1607 09/29/18 1854      Assessment/Plan: s/p Procedure(s): Lower Extremity Angiography (Left) Assessment: Stable status post debridement left foot.   Plan: Xeroform and a sterile gauze bandage reapplied to the left foot.  At this point we will leave the bandage intact and clean and dry.  Patient was instructed on no weightbearing on the forefoot but may put pressure on the heel using his OrthoWedge shoe.  Patient should be stable for discharge with follow-up next week with 1 of my partners for reevaluation and myself in a couple of weeks.  LOS: 3 days    Ricci Barkerodd W Tryone Kille 10/02/2018

## 2018-10-02 NOTE — Consult Note (Signed)
Pharmacy Antibiotic Note  Eddie DibblesHosea D Timmers is a 62 y.o. male admitted on 09/29/2018 with cellulitis/wound infection  Pharmacy has been consulted for vancomycin and cefepime dosing. [facility] with [facility]. That dosing information and level was used to prepare this note.  Plan: Continue Cefepime 2g q8h and Vancomycin 1000mg  IV every 8 hours and continue to follow wound culture results  VT drawn 12/25 2359 was 15 ug/ml - at goal  Continue to monitor Scr at least every 3 days for any major changes   Ke: 0.099  T1/2: 6.9h  Vd: 59L  Css: 29.4/14.6 mcg/mL  Goal trough 15-20 mcg/mL   Will draw VT and Scr Monday am to follow-up  Height: 6\' 4"  (193 cm) Weight: 185 lb 6.5 oz (84.1 kg) IBW/kg (Calculated) : 86.8  Temp (24hrs), Avg:99.3 F (37.4 C), Min:97.7 F (36.5 C), Max:102.4 F (39.1 C)  Antimicrobials this admission: vancomycin 12/24 >>   Microbiology results: 12/24 MRSA PCR: pending 12/25 Surgical/Deep Wound Cx > Abundant gram + cocci in pairs      > Abundant gram - rods  Thank you for allowing pharmacy to be a part of this patient's care.  Albina Billetharles M Timo Hartwig, PharmD, BCPS Clinical Pharmacist 10/02/2018 7:46 AM

## 2018-10-02 NOTE — Progress Notes (Signed)
[facility] - [place] [facility]   PATIENT NAME: Austin [name]    MR#:  [id number]  DATE OF BIRTH:  09/20/1956  SUBJECTIVE:  CHIEF COMPLAINT:  Pt had angioplasty yesterday.  Spiked temp of 102.4 during early hours.  REVIEW OF SYSTEMS:  CONSTITUTIONAL: No fever, fatigue or weakness.  EYES: No blurred or double vision.  EARS, NOSE, AND THROAT: No tinnitus or ear pain.  RESPIRATORY: No cough, shortness of breath, wheezing or hemoptysis.  CARDIOVASCULAR: No chest pain, orthopnea, edema.  GASTROINTESTINAL: No nausea, vomiting, diarrhea or abdominal pain.  GENITOURINARY: No dysuria, hematuria.  ENDOCRINE: No polyuria, nocturia,  HEMATOLOGY: No anemia, easy bruising or bleeding SKIN: No rash or lesion. MUSCULOSKELETAL: No joint pain or arthritis.   NEUROLOGIC: No tingling, numbness, weakness.  PSYCHIATRY: No anxiety or depression.   DRUG ALLERGIES:  No Known Allergies  VITALS:  Blood pressure (!) 141/68, pulse 80, temperature 99.1 F (37.3 C), temperature source Oral, resp. rate 19, height 6\' 4"  (1.93 m), weight 84.1 kg, SpO2 100 %.  PHYSICAL EXAMINATION:  GENERAL:  62 y.o.-year-old patient lying in the bed with no acute distress.  EYES: Pupils equal, round, reactive to light and accommodation. No scleral icterus. Extraocular muscles intact.  HEENT: Head atraumatic, normocephalic. Oropharynx and nasopharynx clear.  NECK:  Supple, no jugular venous distention. No thyroid enlargement, no tenderness.  LUNGS: Normal breath sounds bilaterally, no wheezing, rales,rhonchi or crepitation. No use of accessory muscles of respiration.  CARDIOVASCULAR: S1, S2 normal. No murmurs, rubs, or gallops.  ABDOMEN: Soft, nontender, nondistended. Bowel sounds present. No organomegaly or mass.  EXTREMITIES: No pedal edema, cyanosis, or clubbing.  NEUROLOGIC: Cranial nerves II through XII are intact. Muscle strength 5/5 in all extremities. Sensation intact. Gait not checked.   PSYCHIATRIC: The patient is alert and oriented x 3.  SKIN: No obvious rash, lesion, or ulcer.    LABORATORY PANEL:   CBC Recent Labs  Lab 09/30/18 2329  WBC 5.4  HGB 14.4  HCT 43.6  PLT 125*   ------------------------------------------------------------------------------------------------------------------  Chemistries  Recent Labs  Lab 09/30/18 0337 09/30/18 2329 10/02/18 0317  NA 138 136  --   K 4.4 4.5  --   CL 106 106  --   CO2 24 24  --   GLUCOSE 277* 179*  --   BUN 15 14  --   CREATININE 1.02 0.91 0.94  CALCIUM 9.0 9.1  --   AST 19  --   --   ALT 17  --   --   ALKPHOS 81  --   --   BILITOT 0.9  --   --    ------------------------------------------------------------------------------------------------------------------  Cardiac Enzymes No results for input(s): TROPONINI in the last 168 hours. ------------------------------------------------------------------------------------------------------------------  RADIOLOGY:  No results found.  EKG:  No orders found for this or any previous visit.  ASSESSMENT AND PLAN:     #Bilateral lower extremity cellulitis with worsening of the left foot wound with gangrenous changes Podiatry consult placed to Dr. Alberteen Spindleline status post left foot debridement 09/30/2018 Left foot wound culture with gram-positive cocci and gram-negative rods -patient on IV vancomycin, cefepime, still spiked temperature today -We will get blood cultures if patient is febrile again Dressing left intact on the left.  Begin daily bandage change for the ulceration on the right foot.    Patient had dressing change on his surgical left foot today by podiatry Pain management as needed PT consult placed   #Peripheral vascular disease Vascular surgery consult  placed to Dr. Evie LacksEsco, planning to do angiogram tomorrow Patient is status post prior iliac, SFA and popliteal stenting and tibial angioplasty in May 2019 10/01/18-Percutaneous transluminal  angioplasty of the entire left SFA and popliteal artery with two  drug-coated angioplasty balloons   Percutaneous transluminal angioplasty of the left tibioperoneal trunk in the proximal to mid posterior tibial artery with angioplasty balloon and then the tibioperoneal trunk and proximal posterior tibial artery with [name] drug-coated angioplasty balloon;  Viabahn stent placement x3 to the left SFA and popliteal arteries for residual stenosis after angioplasty.   Resume aspirin and Plavix and okay to discharge patient from vascular standpoint  #Insulin requiring diabetes mellitus Resume Lantus and provide sliding scale insulin diabetic diet  #History of DVT -okay to resume Eliquis  PT eval pending  All the records are reviewed and case discussed with Care Management/Social Workerr. Management plans discussed with the patient, family and they are in agreement.  CODE STATUS: fc  TOTAL TIME TAKING CARE OF THIS PATIENT: 35 minutes.   POSSIBLE D/C IN 1- 2  DAYS, DEPENDING ON CLINICAL CONDITION.  Note: This dictation was prepared with Dragon dictation along with smaller phrase technology. Any transcriptional errors that result from this process are unintentional.   Ramonita LabAruna Criselda Starke M.D on 10/02/2018 at 3:37 PM  Between 7am to 6pm - Pager - [phone] After 6pm go to [email or web address] - password [email or web address] [facility]  [facility]  Office  [phone]  CC: Primary care physician; [name], [name], MD

## 2018-10-02 NOTE — Progress Notes (Signed)
1 Day Post-Op   Subjective/Chief Complaint: Complains of tingling in the left foot. Denies leg/calf pain.   Objective: Vital signs in last 24 hours: Temp:  [97.7 F (36.5 C)-102.4 F (39.1 C)] 102.4 F (39.1 C) (12/27 0727) Pulse Rate:  [74-132] 102 (12/27 0727) Resp:  [12-20] 19 (12/27 0356) BP: (105-176)/(53-92) 114/58 (12/27 0727) SpO2:  [97 %-100 %] 97 % (12/27 0727) Weight:  [84.1 kg] 84.1 kg (12/26 0818) Last BM Date: 09/29/18  Intake/Output from previous day: 12/26 0701 - 12/27 0700 In: 1431.6 [P.O.:480; IV Piggyback:951.6] Out: 2110 [Urine:2110] Intake/Output this shift: No intake/output data recorded.  General appearance: alert and no distress Cardio: regular rate and rhythm Extremities: Right groin- soft, no hematoma. Left leg warm, foot dressing in place  Lab Results:  Recent Labs    09/30/18 0337 09/30/18 2329  WBC 5.5 5.4  HGB 14.3 14.4  HCT 42.3 43.6  PLT 203 125*   BMET Recent Labs    09/30/18 0337 09/30/18 2329 10/02/18 0317  NA 138 136  --   K 4.4 4.5  --   CL 106 106  --   CO2 24 24  --   GLUCOSE 277* 179*  --   BUN 15 14  --   CREATININE 1.02 0.91 0.94  CALCIUM 9.0 9.1  --    PT/INR No results for input(s): LABPROT, INR in the last 72 hours. ABG No results for input(s): PHART, HCO3 in the last 72 hours.  Invalid input(s): PCO2, PO2  Studies/Results: No results found.  Anti-infectives: Anti-infectives (From admission, onward)   Start     Dose/Rate Route Frequency Ordered Stop   10/01/18 1200  ceFEPIme (MAXIPIME) 2 g in sodium chloride 0.9 % 100 mL IVPB     2 g 200 mL/hr over 30 Minutes Intravenous Every 8 hours 10/01/18 0857     10/01/18 1015  ceFAZolin (ANCEF) IVPB 2g/100 mL premix  Status:  Discontinued     2 g 200 mL/hr over 30 Minutes Intravenous  Once 10/01/18 1007 10/01/18 1009   10/01/18 1007  ceFAZolin (ANCEF) IVPB 2g/100 mL premix  Status:  Discontinued     2 g 200 mL/hr over 30 Minutes Intravenous 30 min pre-op  10/01/18 1007 10/01/18 1058   09/30/18 0000  vancomycin (VANCOCIN) IVPB 1000 mg/200 mL premix     1,000 mg 200 mL/hr over 60 Minutes Intravenous Every 8 hours 09/29/18 1715     09/29/18 1700  vancomycin (VANCOCIN) IVPB 1000 mg/200 mL premix     1,000 mg 200 mL/hr over 60 Minutes Intravenous  Once 09/29/18 1607 09/29/18 1854      Assessment/Plan: s/p Procedure(s): Lower Extremity Angiography (Left) S/P LEFT SFA, POP and tibial angioplasty, stenting.  Continue Plavix. Foot wound care per Podiatry.  LOS: 3 days    Austin [name], Austin [name] 10/02/2018

## 2018-10-02 NOTE — NC FL2 (Signed)
[place] MEDICAID FL2 LEVEL OF CARE SCREENING TOOL     IDENTIFICATION  Patient Name: Austin DibblesHosea D [name] Birthdate: 04/27/1956 Sex: male Admission Date (Current Location): 09/29/2018  [place]   Facility and Address:  [facility], [address], [place], [place] [address]      Provider Number: [id number]  Attending Physician Name and Address:  Ramonita LabGouru, Aruna, MD  Relative Name and Phone Number:       Current Level of Care: Hospital Recommended Level of Care: Skilled Nursing Facility Prior Approval Number:    Date Approved/Denied:   PASRR Number: [id number])  Discharge Plan: SNF    Current Diagnoses: Patient Active Problem List   Diagnosis Date Noted  . Malnutrition of moderate degree 10/02/2018  . Foot ulcer (HCC) 09/29/2018  . Gangrene of left foot (HCC) 02/09/2018  . Atherosclerosis of native arteries of extremity with intermittent claudication (HCC) 05/25/2017  . Diabetes (HCC) 05/25/2017  . Essential hypertension 05/25/2017  . Hyperlipidemia 05/25/2017    Orientation RESPIRATION BLADDER Height & Weight     Self, Time, Situation, Place  Normal Continent Weight: 185 lb 6.5 oz (84.1 kg) Height:  6\' 4"  (193 cm)  BEHAVIORAL SYMPTOMS/MOOD NEUROLOGICAL BOWEL NUTRITION STATUS      Continent Diet(Diet: Carb Modified. )  AMBULATORY STATUS COMMUNICATION OF NEEDS Skin   Extensive Assist Verbally Surgical wounds(Incision: Left Foot. )                       Personal Care Assistance Level of Assistance  Bathing, Feeding, Dressing Bathing Assistance: Limited assistance Feeding assistance: Independent Dressing Assistance: Limited assistance     Functional Limitations Info  Sight, Hearing, Speech Sight Info: Adequate Hearing Info: Adequate Speech Info: Adequate    SPECIAL CARE FACTORS FREQUENCY  PT (By licensed PT), OT (By licensed OT)     PT Frequency: (5) OT Frequency: (5)             Contractures      Additional Factors Info  Code Status, Allergies Code Status Info: (Full Code. ) Allergies Info: (No Known Allergies. )           Current Medications (10/02/2018):  This is the current hospital active medication list Current Facility-Administered Medications  Medication Dose Route Frequency Provider Last Rate Last Dose  . 0.9 %  sodium chloride infusion   Intravenous PRN Linus Galasline, Todd, DPM 10 mL/hr at 09/30/18 0200    . acetaminophen (TYLENOL) tablet 650 mg  650 mg Oral Q6H PRN Linus Galasline, Todd, DPM   650 mg at 10/02/18 0732  . aspirin EC tablet 81 mg  81 mg Oral Daily Annice Needyew, Jason S, MD   81 mg at 10/02/18 [id number]  . atorvastatin (LIPITOR) tablet 80 mg  80 mg Oral q1800 Linus Galasline, Todd, DPM   80 mg at 10/01/18 1934  . ceFEPIme (MAXIPIME) 2 g in sodium chloride 0.9 % 100 mL IVPB  2 g Intravenous Q8H Annice Needyew, Jason S, MD 200 mL/hr at 10/02/18 0606 2 g at 10/02/18 0606  . clopidogrel (PLAVIX) tablet 75 mg  75 mg Oral Daily Annice Needyew, Jason S, MD   75 mg at 10/02/18 [id number]  . docusate sodium (COLACE) capsule 100 mg  100 mg Oral BID Linus Galasline, Todd, DPM   100 mg at 10/02/18 [id number]  . insulin aspart (novoLOG) injection 0-5 Units  0-5 Units Subcutaneous QHS Linus Galasline, Todd, DPM      . insulin aspart (novoLOG) injection 0-9 Units  0-9 Units Subcutaneous TID WC Linus Galasline, Todd, DPM   2 Units at 10/02/18 [id number]  . lisinopril (PRINIVIL,ZESTRIL) tablet 10 mg  10 mg Oral Daily Linus Galasline, Todd, DPM   10 mg at 10/02/18 [id number]  . morphine 2 MG/ML injection 2 mg  2 mg Intravenous Q4H PRN Linus Galasline, Todd, DPM   2 mg at 10/01/18 2127  . multivitamin-lutein (OCUVITE-LUTEIN) capsule 1 capsule  1 capsule Oral Daily Gouru, Aruna, MD   1 capsule at 10/02/18 0823  . mupirocin ointment (BACTROBAN) 2 % 1 application  1 application Nasal BID Linus Galasline, Todd, DPM   1 application at 10/01/18 2121  . mupirocin ointment (BACTROBAN) 2 %   Nasal Daily Linus Galasline, Todd, DPM      . nicotine (NICODERM CQ - dosed in mg/24 hours) patch 21 mg  21 mg Transdermal  Daily Linus Galasline, Todd, DPM   21 mg at 09/30/18 [id number]  . ondansetron (ZOFRAN) tablet 4 mg  4 mg Oral Q6H PRN Linus Galasline, Todd, DPM       Or  . ondansetron [facility](ZOFRAN) injection 4 mg  4 mg Intravenous Q6H PRN Linus Galasline, Todd, DPM      . oxyCODONE-acetaminophen (PERCOCET/ROXICET) 5-325 MG per tablet 1-2 tablet  1-2 tablet Oral Q4H PRN Linus Galasline, Todd, DPM   2 tablet at 10/01/18 1933  . pneumococcal 23 valent vaccine (PNU-IMMUNE) injection 0.5 mL  0.5 mL Intramuscular Tomorrow-1000 Linus Galasline, Todd, DPM      . polyethylene glycol (MIRALAX / GLYCOLAX) packet 17 g  17 g Oral Daily PRN Linus Galasline, Todd, DPM      . protein supplement (PREMIER PROTEIN) liquid - approved for s/p bariatric surgery  11 oz Oral BID BM Gouru, Aruna, MD   11 oz at 10/02/18 0824  . sodium chloride flush (NS) 0.9 % injection 3 mL  3 mL Intravenous Q12H Linus Galasline, Todd, DPM   3 mL at 10/02/18 0824  . vancomycin (VANCOCIN) IVPB 1000 mg/200 mL premix  1,000 mg Intravenous Q8H Linus Galasline, Todd, DPM 200 mL/hr at 10/02/18 1140 1,000 mg at 10/02/18 1140     Discharge Medications: Please see discharge summary for a list of discharge medications.  Relevant Imaging Results:  Relevant Lab Results:   Additional Information (SSN: [id number])  Diamond [name], Austin CrockerBailey M, LCSW

## 2018-10-02 NOTE — Care Management (Addendum)
[email or web address] home health agency list left with patient. He said he is followed by home health but doesn't know their name. Patient lives with [place]/friend. He depends on friends for transportation. He has a walker available for use at home. [facility] to reach [facility] at [facility]; Not [facility] and not [facility]; message left for [place] with [facility] to see if its their agency. Update: [place] with [facility] said they closed him out in September but glad to take him back- will need new orders.

## 2018-10-02 NOTE — Clinical Social Work Note (Signed)
Clinical Social Work Assessment  Patient Details  Name: Austin [name] MRN: [id number] Date of Birth: [birth year]  Date of referral:  10/02/18               Reason for consult:  Facility Placement                Permission sought to share information with:    Permission granted to share information::     Name::        Agency::     Relationship::     Contact Information:     Housing/Transportation Living arrangements for the past 2 months:  Single Family Home Source of Information:  Patient Patient Interpreter Needed:  None Criminal Activity/Legal Involvement Pertinent to Current Situation/Hospitalization:  No - Comment as needed Significant Relationships:  Friend Lives with:  Roommate Do you feel safe going back to the place where you live?  Yes Need for family participation in patient care:  Yes (Comment)  Care giving concerns:  Patient lives in [place] with his male roommate [place].    Social Worker assessment / plan:  Holiday representative (CSW) met with patient alone at bedside to discuss D/C plan. Patient was alert and oriented X4 and was laying in the bed. CSW introduced self and explained role of CSW department. Per patient he lives with his roommate [place]. CSW explained that PT will evaluate him and make a recommendation of home health or SNF. CSW explained that [facility] would have to approve SNF. Per patient he does not want to go to SNF and prefers to D/C home. Patient reported that he has a walker, cane and wheel chair at home. CSW sent RN case manager a message making her aware of patient's preference for home. CSW will continue to follow and assist as needed.   Employment status:  Disabled (Comment on whether or not currently receiving Disability) Insurance information:  Managed Medicare PT Recommendations:  Not assessed at this time Information / Referral to community resources:  Other (Comment Required)(Patient prefers to D/C home. )  Patient/Family's Response to  care:  Patient prefers to D/C home.   Patient/Family's Understanding of and Emotional Response to Diagnosis, Current Treatment, and Prognosis:  Patient was very pleasant and thanked CSW for assistance.   Emotional Assessment Appearance:  Appears stated age Attitude/Demeanor/Rapport:    Affect (typically observed):  Accepting, Adaptable, Pleasant Orientation:  Oriented to Self, Oriented to Place, Oriented to  Time, Oriented to Situation Alcohol / Substance use:  Not Applicable Psych involvement (Current and /or in the community):  No (Comment)  Discharge Needs  Concerns to be addressed:  Discharge Planning Concerns Readmission within the last 30 days:  No Current discharge risk:  Chronically ill Barriers to Discharge:  Continued Medical Work up   [employer], Veronia Beets, LCSW 10/02/2018, 12:11 PM

## 2018-10-03 LAB — GLUCOSE, CAPILLARY
Glucose-Capillary: 194 mg/dL — ABNORMAL HIGH (ref 70–99)
Glucose-Capillary: 273 mg/dL — ABNORMAL HIGH (ref 70–99)
Glucose-Capillary: 160 mg/dL — ABNORMAL HIGH (ref 70–99)
Glucose-Capillary: 226 mg/dL — ABNORMAL HIGH (ref 70–99)

## 2018-10-03 NOTE — Evaluation (Signed)
Physical Therapy Evaluation Patient Details Name: Austin [name] MRN: [id number] DOB: 10/08/1955 Today's Date: 10/03/2018   History of Present Illness  Pt is a 62 year old male admitted with a nonhealing diabetic foot ulcer.  Pt s/p I&D of L foot 09/30/2018 and angiography, POP and stenting 10/01/2018.  PMH includes DVT and diabetes.  Clinical Impression  Patient is a 62 year old male who lives in a one story home with his roommate.  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  Pt able to get to bedside with mod I and sit with poor posture but without assistance.  Pt perseverating on pain in L foot and use of post op shoe.  PT assisted pt in donning shoe and educated regarding heel WB.  Pt attempted STS 3x with mod-max A and was unable to achieve upright posture, presenting with a posterior lean.  Pt was able to scoot to recliner from bed with very close CGA and VC's as pt appeared to very weak and with somemild tremors.  Pt static muscle strength is fair to good.  Pt will continue to benefit from skilled PT with focus on strength, safe functional mobility, pain management and use of AD.  Due to significant change in mobility status and concern for caregiver support, pt will benefit from SNF placement following discharge.    Follow Up Recommendations SNF    Equipment Recommendations  Other (comment)(Should pt decide to discharge home, he will need a BSC.)    Recommendations for Other Services       Precautions / Restrictions Precautions Precautions: Fall Restrictions Weight Bearing Restrictions: Yes Other Position/Activity Restrictions: WB on heel with surgical shoe donned      Mobility  Bed Mobility Overal bed mobility: Modified Independent             General bed mobility comments: Able to get to EOB with HOB elevated.  Transfers Overall transfer level: Needs assistance Equipment used: Rolling walker (2 wheeled) Transfers: Sit to/from  [employer] CorporationStand;Squat Pivot Transfers Sit to Stand: Max assist   Squat pivot transfers: Min guard     General transfer comment: Mod-Max A to rise from bedside with 3 attempts; pt perseverating on pain and did not achieve upright posture.  Pt able to perform transfer to recliner with VC's and close CGA as pt appears weak and shaky.  Ambulation/Gait                Stairs            Wheelchair Mobility    Modified Rankin (Stroke Patients Only)       Balance Overall balance assessment: Needs assistance Sitting-balance support: Bilateral upper extremity supported;Feet supported Sitting balance-Leahy Scale: Fair     Standing balance support: Bilateral upper extremity supported Standing balance-Leahy Scale: Poor                               Pertinent Vitals/Pain Pain Assessment: 0-10 Pain Score: 8  Pain Location: L foot Pain Descriptors / Indicators: Aching Pain Intervention(s): Limited activity within patient's tolerance;Monitored during session;Premedicated before session    Home Living Family/patient expects to be discharged to:: Private residence Living Arrangements: Other relatives Available Help at Discharge: Available 24 hours/day Type of Home: House Home Access: Stairs to enter Entrance Stairs-Rails: Can reach both Entrance Stairs-Number of Steps: 3 Home Layout: One level Home Equipment: Walker - 2 wheels;Wheelchair - manual  Prior Function Level of Independence: Independent with assistive device(s)         Comments: Pt reports being a community ambulator with use of SPC.     Hand Dominance        Extremity/Trunk Assessment   Upper Extremity Assessment Upper Extremity Assessment: Overall WFL for tasks assessed    Lower Extremity Assessment Lower Extremity Assessment: Overall WFL for tasks assessed;LLE deficits/detail;RLE deficits/detail( 4/5 knee flexion and extension) RLE Sensation: decreased light touch LLE: Unable to fully  assess due to pain    Cervical / Trunk Assessment Cervical / Trunk Assessment: (Pt does not maintain upright posture when sitting or standing.)  Communication   Communication: No difficulties  Cognition Arousal/Alertness: Awake/alert Behavior During Therapy: WFL for tasks assessed/performed Overall Cognitive Status: Within Functional Limits for tasks assessed                                 General Comments: Follows instructions consistently      General Comments      Exercises Other Exercises Other Exercises: Reviewed seated hip abduction, SLR, quad sets, pillow squeezes and glute sets x10 with additional manual and VC's needed for quad sets.   Assessment/Plan    PT Assessment Patient needs continued PT services  PT Problem List Decreased strength;Decreased mobility;Decreased activity tolerance;Decreased balance;Decreased knowledge of use of DME;Pain       PT Treatment Interventions DME instruction;Functional mobility training;Balance training;Patient/family education;Gait training;Therapeutic activities;Stair training;Therapeutic exercise    PT Goals (Current goals can be found in the Care Plan section)  Acute Rehab PT Goals Patient Stated Goal: To return home and to regular daily activity as soon as possible. PT Goal Formulation: With patient Time For Goal Achievement: 10/17/18 Potential to Achieve Goals: Good    Frequency 7X/week   Barriers to discharge        Co-evaluation               AM-PAC PT "6 Clicks" Mobility  Outcome Measure Help needed turning from your back to your side while in a flat bed without using bedrails?: None Help needed moving from lying on your back to sitting on the side of a flat bed without using bedrails?: None Help needed moving to and from a bed to a chair (including a wheelchair)?: A Lot Help needed standing up from a chair using your arms (e.g., wheelchair or bedside chair)?: A Lot Help needed to walk in  hospital room?: Total Help needed climbing 3-5 steps with a railing? : Total 6 Click Score: 14    End of Session Equipment Utilized During Treatment: Gait belt Activity Tolerance: Patient limited by pain;Patient limited by fatigue Patient left: in chair;with call bell/phone within reach;with chair alarm set Nurse Communication: Mobility status PT Visit Diagnosis: Unsteadiness on feet (R26.81);Other abnormalities of gait and mobility (R26.89);Pain;Muscle weakness (generalized) (M62.81) Pain - Right/Left: Left Pain - part of body: Ankle and joints of foot    Time: [id number] PT Time Calculation (min) (ACUTE ONLY): 30 min   Charges:   PT Evaluation $PT Eval Moderate Complexity: 1 Mod PT Treatments $Therapeutic Activity: 8-22 mins       Austin HewSarah Tanesha [name], PT, DPT  Austin [name] 10/03/2018, 9:48 AM

## 2018-10-03 NOTE — Progress Notes (Signed)
[facility] - [place] at [facility]   PATIENT NAME: Austin [name]    MR#:  [id number]  DATE OF BIRTH:  03/01/1956  SUBJECTIVE:  CHIEF COMPLAINT:  Pt had angioplasty 12/26.   Still has pain Afebrile today  REVIEW OF SYSTEMS:  CONSTITUTIONAL: No fever, fatigue or weakness.  EYES: No blurred or double vision.  EARS, NOSE, AND THROAT: No tinnitus or ear pain.  RESPIRATORY: No cough, shortness of breath, wheezing or hemoptysis.  CARDIOVASCULAR: No chest pain, orthopnea, edema.  GASTROINTESTINAL: No nausea, vomiting, diarrhea or abdominal pain.  GENITOURINARY: No dysuria, hematuria.  ENDOCRINE: No polyuria, nocturia,  HEMATOLOGY: No anemia, easy bruising or bleeding SKIN: No rash or lesion. MUSCULOSKELETAL: No joint pain or arthritis.   NEUROLOGIC: No tingling, numbness, weakness.  PSYCHIATRY: No anxiety or depression.   DRUG ALLERGIES:  No Known Allergies  VITALS:  Blood pressure (!) 108/98, pulse (!) 107, temperature 97.9 F (36.6 C), temperature source Oral, resp. rate 18, height 6\' 4"  (1.93 m), weight 84.1 kg, SpO2 99 %.  PHYSICAL EXAMINATION:  GENERAL:  62 y.o.-year-old patient lying in the bed with no acute distress.  EYES: Pupils equal, round, reactive to light and accommodation. No scleral icterus. Extraocular muscles intact.  HEENT: Head atraumatic, normocephalic. Oropharynx and nasopharynx clear.  NECK:  Supple, no jugular venous distention. No thyroid enlargement, no tenderness.  LUNGS: Normal breath sounds bilaterally, no wheezing, rales,rhonchi or crepitation. No use of accessory muscles of respiration.  CARDIOVASCULAR: S1, S2 normal. No murmurs, rubs, or gallops.  ABDOMEN: Soft, nontender, nondistended. Bowel sounds present. No organomegaly or mass.  EXTREMITIES: No pedal edema, cyanosis, or clubbing.  NEUROLOGIC: Cranial nerves II through XII are intact. Muscle strength 5-/5 in all extremities. Sensation intact. Gait not checked.   PSYCHIATRIC: The patient is alert and oriented x 3.  SKIN: Dressing left foot with wedge boot on   LABORATORY PANEL:   CBC Recent Labs  Lab 09/30/18 2329  WBC 5.4  HGB 14.4  HCT 43.6  PLT 125*   ------------------------------------------------------------------------------------------------------------------  Chemistries  Recent Labs  Lab 09/30/18 0337 09/30/18 2329 10/02/18 0317  NA 138 136  --   K 4.4 4.5  --   CL 106 106  --   CO2 24 24  --   GLUCOSE 277* 179*  --   BUN 15 14  --   CREATININE 1.02 0.91 0.94  CALCIUM 9.0 9.1  --   AST 19  --   --   ALT 17  --   --   ALKPHOS 81  --   --   BILITOT 0.9  --   --    ------------------------------------------------------------------------------------------------------------------  Cardiac Enzymes No results for input(s): TROPONINI in the last 168 hours. ------------------------------------------------------------------------------------------------------------------  RADIOLOGY:  No results found.  EKG:  No orders found for this or any previous visit.  ASSESSMENT AND PLAN:     #Bilateral lower extremity cellulitis with worsening of the left foot wound with gangrenous changes Podiatry consult placed to Dr. Alberteen Spindleline status post left foot debridement 09/30/2018 culture with gram-positive cocci and gram-negative rods -patient on IV vancomycin, cefepime Blood cultures NGTD Dressing being changed by podiatry Pain meds as needed  #Peripheral arterial disease Patient is status post prior iliac, SFA and popliteal stenting and tibial angioplasty in May 2019 10/01/18-Percutaneous transluminal angioplasty of the entire left SFA and popliteal artery with two  drug-coated angioplasty balloons   Percutaneous transluminal angioplasty of the left tibioperoneal trunk in the proximal to mid posterior  tibial artery with angioplasty balloon and then the tibioperoneal trunk and proximal posterior tibial artery with [name]  drug-coated angioplasty balloon;  Viabahn stent placement x3 to the left SFA and popliteal arteries for residual stenosis after angioplasty.   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 Management plans discussed with the patient, family and they are in agreement.  CODE STATUS: FULL CODE  TOTAL TIME TAKING CARE OF THIS PATIENT: 35 minutes.   POSSIBLE D/C IN 1- 2  DAYS, DEPENDING ON CLINICAL CONDITION.  Note: This dictation was prepared with Dragon dictation along with smaller phrase technology. Any transcriptional errors that result from this process are unintentional.   Orie FishermanSrikar R Kalina Morabito M.D on 10/03/2018 at 10:21 AM  Between 7am to 6pm - Pager - [phone]  After 6pm go to [email or web address] - password [email or web address] [facility]  Office  [phone]  CC: Primary care physician; [name], [name], MD

## 2018-10-03 NOTE — Progress Notes (Signed)
2 Days Post-Op   Subjective/Chief Complaint: Patient seen.  States the pain in the left foot is better today.  A little stinging still   Objective: Vital signs in last 24 hours: Temp:  [97.9 F (36.6 C)-100 F (37.8 C)] 97.9 F (36.6 C) (12/28 0743) Pulse Rate:  [75-107] 107 (12/28 0937) Resp:  [18-19] 18 (12/28 0743) BP: (108-116)/(51-98) 108/98 (12/28 0743) SpO2:  [98 %-99 %] 99 % (12/28 0743) Last BM Date: 09/30/18  Intake/Output from previous day: 12/27 0701 - 12/28 0700 In: 573.9 [IV Piggyback:573.9] Out: 2900 [Urine:2900] Intake/Output this shift: Total I/O In: -  Out: 200 [Urine:200]  The dressing is dry and intact.  Was not removed today.  Lab Results:  Recent Labs    09/30/18 2329  WBC 5.4  HGB 14.4  HCT 43.6  PLT 125*   BMET Recent Labs    09/30/18 2329 10/02/18 0317  NA 136  --   K 4.5  --   CL 106  --   CO2 24  --   GLUCOSE 179*  --   BUN 14  --   CREATININE 0.91 0.94  CALCIUM 9.1  --    PT/INR No results for input(s): LABPROT, INR in the last 72 hours. ABG No results for input(s): PHART, HCO3 in the last 72 hours.  Invalid input(s): PCO2, PO2  Studies/Results: No results found.  Anti-infectives: Anti-infectives (From admission, onward)   Start     Dose/Rate Route Frequency Ordered Stop   10/01/18 1200  ceFEPIme (MAXIPIME) 2 g in sodium chloride 0.9 % 100 mL IVPB     2 g 200 mL/hr over 30 Minutes Intravenous Every 8 hours 10/01/18 0857     10/01/18 1015  ceFAZolin (ANCEF) IVPB 2g/100 mL premix  Status:  Discontinued     2 g 200 mL/hr over 30 Minutes Intravenous  Once 10/01/18 1007 10/01/18 1009   10/01/18 1007  ceFAZolin (ANCEF) IVPB 2g/100 mL premix  Status:  Discontinued     2 g 200 mL/hr over 30 Minutes Intravenous 30 min pre-op 10/01/18 1007 10/01/18 1058   09/30/18 0000  vancomycin (VANCOCIN) IVPB 1000 mg/200 mL premix     1,000 mg 200 mL/hr over 60 Minutes Intravenous Every 8 hours 09/29/18 1715     09/29/18 1700   vancomycin (VANCOCIN) IVPB 1000 mg/200 mL premix     1,000 mg 200 mL/hr over 60 Minutes Intravenous  Once 09/29/18 1607 09/29/18 1854      Assessment/Plan: s/p Procedure(s): Lower Extremity Angiography (Left) Assessment: Stable status post debridement left foot.   Plan: Dressing left intact today.  Should be stable for discharge soon on appropriate antibiotics.  Would plan for follow-up with 1 of my partners sometime this week for reevaluation and then myself in a couple of weeks for suture removal.  LOS: 4 days    Ricci Barkerodd W Mekenna Finau 10/03/2018

## 2018-10-03 NOTE — Clinical Social Work Note (Signed)
The CSW met with the patient at bedside (patient in recliner) to discuss PT recommendation for SNF. The patient was pleasant and declined SNF. The patient shared he is willing to have HHPT. The CSW is signing off. Please consult should the patient change his mind on discharge plan.   Santiago Bumpers, MSW, Latanya Presser [phone]

## 2018-10-04 ENCOUNTER — Encounter: Payer: Self-pay | Admitting: Internal Medicine

## 2018-10-04 LAB — CBC
HEMATOCRIT: 40 % (ref 39.0–52.0)
Hemoglobin: 13.7 g/dL (ref 13.0–17.0)
MCH: 29.6 pg (ref 26.0–34.0)
MCHC: 34.3 g/dL (ref 30.0–36.0)
MCV: 86.4 fL (ref 80.0–100.0)
Platelets: 169 10*3/uL (ref 150–400)
RBC: 4.63 MIL/uL (ref 4.22–5.81)
RDW: 12.4 % (ref 11.5–15.5)
WBC: 12.2 10*3/uL — AB (ref 4.0–10.5)
nRBC: 0 % (ref 0.0–0.2)

## 2018-10-04 LAB — GLUCOSE, CAPILLARY
Glucose-Capillary: 182 mg/dL — ABNORMAL HIGH (ref 70–99)
Glucose-Capillary: 251 mg/dL — ABNORMAL HIGH (ref 70–99)

## 2018-10-04 MED ORDER — BISACODYL 10 MG RE SUPP
10.0000 mg | Freq: Once | RECTAL | Status: AC
  Administered 2018-10-04: 10 mg via RECTAL
  Filled 2018-10-04: qty 1

## 2018-10-04 MED ORDER — DOXYCYCLINE HYCLATE 100 MG PO CAPS: 100.0000 mg | ORAL_CAPSULE | Freq: Two times a day (BID) | ORAL | 0 refills | Status: DC

## 2018-10-04 MED ORDER — FLEET ENEMA 7-19 GM/118ML RE ENEM: 1.0000 | ENEMA | Freq: Once | RECTAL | Status: DC

## 2018-10-04 MED ORDER — LACTULOSE 10 GM/15ML PO SOLN
30.0000 g | Freq: Once | ORAL | Status: AC
  Administered 2018-10-04: 30 g via ORAL
  Filled 2018-10-04: qty 60

## 2018-10-04 MED ORDER — DOCUSATE SODIUM 100 MG PO CAPS: 100.0000 mg | ORAL_CAPSULE | Freq: Two times a day (BID) | ORAL | 0 refills | Status: DC

## 2018-10-04 MED ORDER — AMOXICILLIN-POT CLAVULANATE 875-125 MG PO TABS: 1.0000 | ORAL_TABLET | Freq: Two times a day (BID) | ORAL | 0 refills | Status: DC

## 2018-10-04 MED ORDER — POLYETHYLENE GLYCOL 3350 17 G PO PACK: 17.0000 g | PACK | Freq: Every day | ORAL | 0 refills | Status: DC

## 2018-10-04 MED ORDER — OXYCODONE HCL 5 MG PO TABS: 5.0000 mg | ORAL_TABLET | Freq: Four times a day (QID) | ORAL | 0 refills | Status: AC | PRN

## 2018-10-04 NOTE — Discharge Instructions (Addendum)
°  Resume diet as before  It is important you use the wedge shoe every time you walk.  Your dressing will be changed at the podiatrists office.

## 2018-10-04 NOTE — Progress Notes (Signed)
Daily Progress Note   Subjective  - 3 Days Post-Op  Follow-up left great toe amputation.  Some pain.  Objective Vitals:   10/03/18 1935 10/03/18 2339 10/04/18 0345 10/04/18 0741  BP: 122/75 109/74 (!) 116/97 (!) 83/74  Pulse: 92 76 (!) 103 (!) 104  Resp: 15 19 19 18   Temp: 99.5 F (37.5 C) 98 F (36.7 C) 100.1 F (37.8 C) 97.7 F (36.5 C)  TempSrc: Oral Oral Oral Oral  SpO2: 99% 97% 100% 100%  Weight:      Height:        Physical Exam: Incision well coapted.  Minimal drainage on bandage.  No signs of infection at all.  Laboratory CBC    Component Value Date/Time   WBC 12.2 (H) 10/04/2018 0425   HGB 13.7 10/04/2018 0425   HGB 14.1 11/17/2013 0621   HCT 40.0 10/04/2018 0425   HCT 40.1 11/17/2013 0621   PLT 169 10/04/2018 0425   PLT 218 11/17/2013 0621    BMET    Component Value Date/Time   NA 136 09/30/2018 2329   NA 135 (L) 11/17/2013 0621   K 4.5 09/30/2018 2329   K 4.3 11/17/2013 0621   CL 106 09/30/2018 2329   CL 99 11/17/2013 0621   CO2 24 09/30/2018 2329   CO2 31 11/17/2013 0621   GLUCOSE 179 (H) 09/30/2018 2329   GLUCOSE 172 (H) 11/17/2013 0621   BUN 14 09/30/2018 2329   BUN 10 11/17/2013 0621   CREATININE 0.94 10/02/2018 0317   CREATININE 0.85 11/17/2013 0621   CALCIUM 9.1 09/30/2018 2329   CALCIUM 9.6 11/17/2013 0621   GFRNONAA >60 10/02/2018 0317   GFRNONAA >60 11/17/2013 0621   GFRAA >60 10/02/2018 0317   GFRAA >60 11/17/2013 [id number]    Assessment/Planning: Status post left great toe amputation.   Dressing change.  Leave dressing clean and intact.  Should follow-up with me this week.  Can follow-up with Dr. Graciela HusbandsKlein at the end of the week.  Okay from podiatry standpoint for discharge.  Strongly encourage nonweightbearing to left foot and use of heel wedge shoe for transfer only.  Gwyneth RevelsFowler, Yentl Verge A  10/04/2018, 9:49 AM

## 2018-10-04 NOTE — Care Management Note (Signed)
Case Management Note  Patient Details  Name: Austin DibblesHosea D Zachman MRN: [id number] Date of Birth: 01/21/1956  Subjective/Objective:  Patient to be discharged per MD order. Orders in place for home health services. Previous RNCM presented CMS list and [facility] Per patient that is still his preference. [facility] [place] [facility]. Patient has DME orders for rolling walker and bedside commode. Per patient he has two walkers in home but does need bedside commode. BSC ordered from [facility] and brought to bedside. Patients niece to pick him up.                     Action/Plan:   Expected Discharge Date:  10/04/18               Expected Discharge Plan:  Home w Home Health Services  In-House Referral:     Discharge planning Services  CM Consult  Post Acute Care Choice:  Home Health, Durable Medical Equipment Choice offered to:  Patient  DME Arranged:  Bedside commode DME Agency:  [facility]  [facility]  [facility]  Status of Service:  Completed, signed off  If discussed at Long Length of Stay Meetings, dates discussed:    Additional Comments:  Virgel ManifoldJosh A Glover Capano, RN 10/04/2018, 9:17 AM

## 2018-10-04 NOTE — Progress Notes (Signed)
Nsg Discharge Note  Admit Date:  09/29/2018 Discharge date: 10/04/2018   Eddie DibblesHosea D Wanner to be D/C'd Home per MD order.  AVS completed.  Copy for chart, and copy for patient signed, and dated. Patient/caregiver able to verbalize understanding.  Discharge Medication:   Discharge Assessment: Vitals:   10/04/18 0741 10/04/18 1438  BP: (!) 83/74 130/71  Pulse: (!) 104 (!) 115  Resp: 18 18  Temp: 97.7 F (36.5 C) 98.1 F (36.7 C)  SpO2: 100% 98%   Skin clean, dry and intact without evidence of skin break down, no evidence of skin tears noted. IV catheter discontinued intact. Site without signs and symptoms of complications - no redness or edema noted at insertion site, patient denies c/o pain - only slight tenderness at site.  Dressing with slight pressure applied.  D/c Instructions-Education: Discharge instructions given to patient/family with verbalized understanding. D/c education completed with patient/family including follow up instructions, medication list, d/c activities limitations if indicated, with other d/c instructions as indicated by MD - patient able to verbalize understanding, all questions fully answered. Patient instructed to return to ED, call 911, or call MD for any changes in condition.  Patient escorted via WC, and D/C home via private auto.  Adair LaundryElizabeth A Trevion Hoben, RN 10/04/2018 3:02 PM

## 2018-10-05 ENCOUNTER — Encounter: Payer: Self-pay | Admitting: Vascular Surgery

## 2018-10-05 LAB — AEROBIC/ANAEROBIC CULTURE W GRAM STAIN (SURGICAL/DEEP WOUND)

## 2018-10-07 LAB — CULTURE, BLOOD (ROUTINE X 2)
Culture: NO GROWTH
Culture: NO GROWTH

## 2018-10-11 ENCOUNTER — Emergency Department
Admission: EM | Admit: 2018-10-11 | Discharge: 2018-10-11 | Disposition: A | Discharge status: Home / Self Care | Payer: Medicare Other | Attending: Emergency Medicine | Admitting: Emergency Medicine

## 2018-10-11 ENCOUNTER — Encounter: Payer: Self-pay | Admitting: Emergency Medicine

## 2018-10-11 ENCOUNTER — Other Ambulatory Visit: Payer: Self-pay

## 2018-10-11 DIAGNOSIS — Z89422 Acquired absence of other left toe(s): Secondary | ICD-10-CM | POA: Insufficient documentation

## 2018-10-11 DIAGNOSIS — M79672 Pain in left foot: Secondary | ICD-10-CM | POA: Diagnosis not present

## 2018-10-11 DIAGNOSIS — F17210 Nicotine dependence, cigarettes, uncomplicated: Secondary | ICD-10-CM | POA: Insufficient documentation

## 2018-10-11 DIAGNOSIS — Z794 Long term (current) use of insulin: Secondary | ICD-10-CM | POA: Insufficient documentation

## 2018-10-11 DIAGNOSIS — Z79899 Other long term (current) drug therapy: Secondary | ICD-10-CM | POA: Insufficient documentation

## 2018-10-11 DIAGNOSIS — E1152 Type 2 diabetes mellitus with diabetic peripheral angiopathy with gangrene: Secondary | ICD-10-CM | POA: Diagnosis not present

## 2018-10-11 DIAGNOSIS — M79605 Pain in left leg: Secondary | ICD-10-CM

## 2018-10-11 DIAGNOSIS — E119 Type 2 diabetes mellitus without complications: Secondary | ICD-10-CM | POA: Insufficient documentation

## 2018-10-11 DIAGNOSIS — Z7901 Long term (current) use of anticoagulants: Secondary | ICD-10-CM | POA: Insufficient documentation

## 2018-10-11 MED ORDER — DICLOFENAC SODIUM 1 % TD GEL: 2.0000 g | Freq: Four times a day (QID) | TRANSDERMAL | 0 refills | Status: DC

## 2018-10-11 NOTE — ED Triage Notes (Signed)
Pt to ER via EMS from home with c/o left toe amputation incision dehisence.  Mild bleeding noted.  Pt states unsure when this occurred.

## 2018-10-11 NOTE — ED Provider Notes (Signed)
[facility] Emergency Department Provider Note  ____________________________________________   I have reviewed the triage vital signs and the nursing notes.   HISTORY  Chief Complaint Left leg pain  History limited by: Not Limited   HPI Austin [name] is a 63 y.o. male who presents to the emergency department today via EMS because of concerns for left thigh pain.  EMS stated that he told them he was worried about a wound dehiscence on his left foot.  He did mention that he thought he did this yesterday.  However chart review shows that he was seen by podiatry 3 days ago and they do mention a wound dehiscence.  When I went back and asked patient further about this he then stated that in fact the dehiscence is not new and that he really called because of left thigh pain.  He states that he is tried ibuprofen and Tylenol without any relief.    Per medical record review patient has a history of recent left toe amputation, visit to podiatry 3 days ago where they document a dehiscence of the wound.  Past Medical History:  Diagnosis Date  . Diabetes (HCC)   . DVT (deep venous thrombosis) [facility])     Patient Active Problem List   Diagnosis Date Noted  . Malnutrition of moderate degree 10/02/2018  . Foot ulcer (HCC) 09/29/2018  . Gangrene of left foot (HCC) 02/09/2018  . Atherosclerosis of native arteries of extremity with intermittent claudication (HCC) 05/25/2017  . Diabetes (HCC) 05/25/2017  . Essential hypertension 05/25/2017  . Hyperlipidemia 05/25/2017    Past Surgical History:  Procedure Laterality Date  . AMPUTATION TOE Left 02/10/2018   Procedure: AMPUTATION TOE;  Surgeon: Linus Galas, DPM;  Location: [facility] ORS;  Service: Podiatry;  Laterality: Left;  . groin surgery    . IRRIGATION AND DEBRIDEMENT FOOT Left 09/30/2018   Procedure: IRRIGATION AND DEBRIDEMENT FOOT;  Surgeon: Linus Galas, DPM;  Location: [facility] ORS;  Service: Podiatry;  Laterality: Left;   . LOWER EXTREMITY ANGIOGRAPHY Left 02/12/2018   Procedure: Lower Extremity Angiography;  Surgeon: Annice Needy, MD;  Location: [facility] INVASIVE CV LAB;  Service: Cardiovascular;  Laterality: Left;  . LOWER EXTREMITY ANGIOGRAPHY Left 10/01/2018   Procedure: Lower Extremity Angiography;  Surgeon: Annice Needy, MD;  Location: [facility] INVASIVE CV LAB;  Service: Cardiovascular;  Laterality: Left;  . LOWER EXTREMITY INTERVENTION  02/12/2018   Procedure: LOWER EXTREMITY INTERVENTION;  Surgeon: Annice Needy, MD;  Location: [facility] INVASIVE CV LAB;  Service: Cardiovascular;;  . NECK SURGERY      Prior to Admission medications   Medication Sig Start Date End Date Taking? Authorizing Provider  amoxicillin-clavulanate (AUGMENTIN) 875-125 MG tablet Take 1 tablet by mouth 2 (two) times daily. 10/04/18   Milagros Loll, MD  apixaban (ELIQUIS) 5 MG TABS tablet Take 1 tablet (5 mg total) by mouth 2 (two) times daily. 02/12/18   Ramonita Lab, MD  atorvastatin (LIPITOR) 80 MG tablet Take 1 tablet (80 mg total) by mouth daily at 6 PM. 02/12/18   Gouru, Aruna, MD  clopidogrel (PLAVIX) 75 MG tablet Take 1 tablet (75 mg total) by mouth daily. 02/12/18   Ramonita Lab, MD  diclofenac sodium (VOLTAREN) 1 % GEL Apply 2 g topically 4 (four) times daily. 10/11/18   Phineas Semen, MD  docusate sodium (COLACE) 100 MG capsule Take 1 capsule (100 mg total) by mouth 2 (two) times daily. 10/04/18   Milagros Loll, MD  doxycycline (VIBRAMYCIN) 100 MG capsule  Take 1 capsule (100 mg total) by mouth 2 (two) times daily. 10/04/18   Milagros Loll, MD  insulin glargine (LANTUS) 100 UNIT/ML injection Inject 0.15 mLs (15 Units total) into the skin daily. 02/13/18   Gouru, Deanna Artis, MD  nicotine (NICODERM CQ - DOSED IN MG/24 HOURS) 21 mg/24hr patch Place 1 patch (21 mg total) onto the skin daily. 02/12/18   Gouru, Deanna Artis, MD  polyethylene glycol (MIRALAX) packet Take 17 g by mouth daily. 10/04/18   Milagros Loll, MD    Allergies Patient has no known  allergies.  Family History  Problem Relation Age of Onset  . Diabetes Mother   . Coronary artery disease Father   . Hypertension Sister   . Leukemia Brother     Social History Social History   Tobacco Use  . Smoking status: Heavy Tobacco Smoker    Packs/day: 1.00    Years: 30.00    Pack years: 30.00    Types: Cigarettes  . Smokeless tobacco: Never Used  Substance Use Topics  . Alcohol use: No  . Drug use: No    Review of Systems Constitutional: No fever/chills Eyes: No visual changes. ENT: No sore throat. Cardiovascular: Denies chest pain. Respiratory: Denies shortness of breath. Gastrointestinal: No abdominal pain.  No nausea, no vomiting.  No diarrhea.   Genitourinary: Negative for dysuria. Musculoskeletal: Left thigh pain. Left foot pain. Skin: Negative for rash. Neurological: Negative for headaches, focal weakness or numbness.  ____________________________________________   PHYSICAL EXAM:  VITAL SIGNS: ED Triage Vitals  Enc Vitals Group     BP 10/11/18 1507 (!) 150/81     Pulse Rate 10/11/18 1507 92     Resp 10/11/18 1507 18     Temp 10/11/18 1507 98 F (36.7 C)     Temp src --      SpO2 10/11/18 1507 97 %     Weight 10/11/18 1508 200 lb (90.7 kg)     Height 10/11/18 1508 6\' 4"  (1.93 m)     Head Circumference --      Peak Flow --      Pain Score 10/11/18 1508 0   Constitutional: Alert and oriented.  Eyes: Conjunctivae are normal.  ENT      Head: Normocephalic and atraumatic.      Nose: No congestion/rhinnorhea.      Mouth/Throat: Mucous membranes are moist.      Neck: No stridor. Cardiovascular: Normal rate, regular rhythm.  No murmurs, rubs, or gallops.  Respiratory: Normal respiratory effort without tachypnea nor retractions. Breath sounds are clear and equal bilaterally. No wheezes/rales/rhonchi. Gastrointestinal: Soft and non tender. No rebound. No guarding.  Genitourinary: Deferred Musculoskeletal: s/p amputation of left 2nd toe.  Dehiscence of wound.  Neurologic:  Normal speech and language. No gross focal neurologic deficits are appreciated.  Skin:  Skin is warm, dry and intact. No rash noted. Psychiatric: Mood and affect are normal. Speech and behavior are normal. Patient exhibits appropriate insight and judgment.  ____________________________________________    LABS (pertinent positives/negatives)  None  ____________________________________________   EKG  None  ____________________________________________    RADIOLOGY  None  ____________________________________________   PROCEDURES  Procedures  ____________________________________________   INITIAL IMPRESSION / ASSESSMENT AND PLAN / ED COURSE  Pertinent labs & imaging results that were available during my care of the patient were reviewed by me and considered in my medical decision making (see chart for details).   Patient presented to the emergency department today apparently for left thigh pain.  However  he told EMS was because of dehiscence of his left foot.  However per chart review this is not a new issue.  Terms of the thigh pain no deformity.  No history of trauma.  I doubt a fracture.  Will give patient Voltaren gel for pain control.  Did discuss with patient importance of following up closely with podiatry.  Did state the recommendation of wet to dry bandages would continue to be the recommendation.   ____________________________________________   FINAL CLINICAL IMPRESSION(S) / ED DIAGNOSES  Final diagnoses:  Left leg pain     Note: This dictation was prepared with Dragon dictation. Any transcriptional errors that result from this process are unintentional     Phineas SemenGoodman, Benito Lemmerman, MD 10/11/18 1534

## 2018-10-11 NOTE — Discharge Instructions (Addendum)
Please seek medical attention for any high fevers, chest pain, shortness of breath, change in behavior, persistent vomiting, bloody stool or any other new or concerning symptoms.  

## 2018-10-11 NOTE — ED Notes (Signed)
Dressing applied and discharge papers reviewed with pt.

## 2018-10-11 NOTE — ED Notes (Signed)
Herbert Seta, pts niece called and reported that she would be to ED in 15 minutes to pick up pt.

## 2018-10-14 ENCOUNTER — Inpatient Hospital Stay
Admission: AD | Admit: 2018-10-14 | Discharge: 2018-10-30 | DRG: 270 | Disposition: A | Discharge status: Skilled Nursing Facility | Payer: Medicare Other | Attending: Internal Medicine | Admitting: Internal Medicine

## 2018-10-14 ENCOUNTER — Other Ambulatory Visit: Payer: Self-pay

## 2018-10-14 DIAGNOSIS — E1165 Type 2 diabetes mellitus with hyperglycemia: Secondary | ICD-10-CM | POA: Diagnosis present

## 2018-10-14 DIAGNOSIS — E11621 Type 2 diabetes mellitus with foot ulcer: Secondary | ICD-10-CM | POA: Diagnosis present

## 2018-10-14 DIAGNOSIS — E871 Hypo-osmolality and hyponatremia: Secondary | ICD-10-CM | POA: Diagnosis not present

## 2018-10-14 DIAGNOSIS — L84 Corns and callosities: Secondary | ICD-10-CM | POA: Diagnosis not present

## 2018-10-14 DIAGNOSIS — Z89412 Acquired absence of left great toe: Secondary | ICD-10-CM | POA: Diagnosis not present

## 2018-10-14 DIAGNOSIS — D62 Acute posthemorrhagic anemia: Secondary | ICD-10-CM | POA: Diagnosis not present

## 2018-10-14 DIAGNOSIS — E11628 Type 2 diabetes mellitus with other skin complications: Secondary | ICD-10-CM | POA: Diagnosis present

## 2018-10-14 DIAGNOSIS — E1152 Type 2 diabetes mellitus with diabetic peripheral angiopathy with gangrene: Principal | ICD-10-CM | POA: Diagnosis present

## 2018-10-14 DIAGNOSIS — B964 Proteus (mirabilis) (morganii) as the cause of diseases classified elsewhere: Secondary | ICD-10-CM | POA: Diagnosis not present

## 2018-10-14 DIAGNOSIS — Y92239 Unspecified place in hospital as the place of occurrence of the external cause: Secondary | ICD-10-CM | POA: Diagnosis not present

## 2018-10-14 DIAGNOSIS — F17210 Nicotine dependence, cigarettes, uncomplicated: Secondary | ICD-10-CM | POA: Diagnosis present

## 2018-10-14 DIAGNOSIS — E785 Hyperlipidemia, unspecified: Secondary | ICD-10-CM | POA: Diagnosis present

## 2018-10-14 DIAGNOSIS — Z7901 Long term (current) use of anticoagulants: Secondary | ICD-10-CM | POA: Diagnosis not present

## 2018-10-14 DIAGNOSIS — I70202 Unspecified atherosclerosis of native arteries of extremities, left leg: Secondary | ICD-10-CM | POA: Diagnosis present

## 2018-10-14 DIAGNOSIS — T8130XA Disruption of wound, unspecified, initial encounter: Secondary | ICD-10-CM | POA: Diagnosis not present

## 2018-10-14 DIAGNOSIS — I10 Essential (primary) hypertension: Secondary | ICD-10-CM | POA: Diagnosis present

## 2018-10-14 DIAGNOSIS — I70262 Atherosclerosis of native arteries of extremities with gangrene, left leg: Secondary | ICD-10-CM | POA: Diagnosis not present

## 2018-10-14 DIAGNOSIS — L97529 Non-pressure chronic ulcer of other part of left foot with unspecified severity: Secondary | ICD-10-CM | POA: Diagnosis not present

## 2018-10-14 DIAGNOSIS — Z955 Presence of coronary angioplasty implant and graft: Secondary | ICD-10-CM

## 2018-10-14 DIAGNOSIS — Z89422 Acquired absence of other left toe(s): Secondary | ICD-10-CM | POA: Diagnosis not present

## 2018-10-14 DIAGNOSIS — I70245 Atherosclerosis of native arteries of left leg with ulceration of other part of foot: Secondary | ICD-10-CM | POA: Diagnosis not present

## 2018-10-14 DIAGNOSIS — Z794 Long term (current) use of insulin: Secondary | ICD-10-CM | POA: Diagnosis not present

## 2018-10-14 DIAGNOSIS — I7092 Chronic total occlusion of artery of the extremities: Secondary | ICD-10-CM | POA: Diagnosis not present

## 2018-10-14 DIAGNOSIS — L089 Local infection of the skin and subcutaneous tissue, unspecified: Secondary | ICD-10-CM | POA: Diagnosis present

## 2018-10-14 DIAGNOSIS — T8744 Infection of amputation stump, left lower extremity: Secondary | ICD-10-CM | POA: Diagnosis not present

## 2018-10-14 DIAGNOSIS — I96 Gangrene, not elsewhere classified: Secondary | ICD-10-CM | POA: Diagnosis not present

## 2018-10-14 DIAGNOSIS — Z79899 Other long term (current) drug therapy: Secondary | ICD-10-CM | POA: Diagnosis not present

## 2018-10-14 DIAGNOSIS — Z95820 Peripheral vascular angioplasty status with implants and grafts: Secondary | ICD-10-CM | POA: Diagnosis not present

## 2018-10-14 DIAGNOSIS — G92 Toxic encephalopathy: Secondary | ICD-10-CM | POA: Diagnosis not present

## 2018-10-14 DIAGNOSIS — Z8249 Family history of ischemic heart disease and other diseases of the circulatory system: Secondary | ICD-10-CM

## 2018-10-14 DIAGNOSIS — Y848 Other medical procedures as the cause of abnormal reaction of the patient, or of later complication, without mention of misadventure at the time of the procedure: Secondary | ICD-10-CM | POA: Diagnosis not present

## 2018-10-14 DIAGNOSIS — T82868A Thrombosis of vascular prosthetic devices, implants and grafts, initial encounter: Secondary | ICD-10-CM | POA: Diagnosis not present

## 2018-10-14 DIAGNOSIS — Z833 Family history of diabetes mellitus: Secondary | ICD-10-CM

## 2018-10-14 DIAGNOSIS — Z86718 Personal history of other venous thrombosis and embolism: Secondary | ICD-10-CM

## 2018-10-14 DIAGNOSIS — E1151 Type 2 diabetes mellitus with diabetic peripheral angiopathy without gangrene: Secondary | ICD-10-CM | POA: Diagnosis present

## 2018-10-14 DIAGNOSIS — Z95828 Presence of other vascular implants and grafts: Secondary | ICD-10-CM

## 2018-10-14 DIAGNOSIS — T50905A Adverse effect of unspecified drugs, medicaments and biological substances, initial encounter: Secondary | ICD-10-CM | POA: Diagnosis not present

## 2018-10-14 DIAGNOSIS — Z7902 Long term (current) use of antithrombotics/antiplatelets: Secondary | ICD-10-CM | POA: Diagnosis not present

## 2018-10-14 DIAGNOSIS — B965 Pseudomonas (aeruginosa) (mallei) (pseudomallei) as the cause of diseases classified elsewhere: Secondary | ICD-10-CM | POA: Diagnosis present

## 2018-10-14 DIAGNOSIS — M79672 Pain in left foot: Secondary | ICD-10-CM | POA: Diagnosis present

## 2018-10-14 LAB — BASIC METABOLIC PANEL
Anion gap: 6 (ref 5–15)
BUN: 12 mg/dL (ref 8–23)
CALCIUM: 9 mg/dL (ref 8.9–10.3)
CO2: 26 mmol/L (ref 22–32)
Chloride: 99 mmol/L (ref 98–111)
Creatinine, Ser: 0.79 mg/dL (ref 0.61–1.24)
GFR calc Af Amer: >60 mL/min (ref >60)
GFR calc non Af Amer: >60 mL/min (ref >60)
Glucose, Bld: 242 mg/dL — ABNORMAL HIGH (ref 70–99)
Potassium: 3.9 mmol/L (ref 3.5–5.1)
Sodium: 131 mmol/L — ABNORMAL LOW (ref 135–145)

## 2018-10-14 LAB — GLUCOSE, CAPILLARY: Glucose-Capillary: 239 mg/dL — ABNORMAL HIGH (ref 70–99)

## 2018-10-14 LAB — CBC
HCT: 38.6 % — ABNORMAL LOW (ref 39.0–52.0)
Hemoglobin: 12.9 g/dL — ABNORMAL LOW (ref 13.0–17.0)
MCH: 28.8 pg (ref 26.0–34.0)
MCHC: 33.4 g/dL (ref 30.0–36.0)
MCV: 86.2 fL (ref 80.0–100.0)
NRBC: 0 % (ref 0.0–0.2)
Platelets: 274 10*3/uL (ref 150–400)
RBC: 4.48 MIL/uL (ref 4.22–5.81)
RDW: 12.3 % (ref 11.5–15.5)
WBC: 9.8 10*3/uL (ref 4.0–10.5)

## 2018-10-14 MED ORDER — INSULIN GLARGINE 100 UNIT/ML ~~LOC~~ SOLN
15.0000 [IU] | Freq: Every day | SUBCUTANEOUS | Status: DC
  Administered 2018-10-14 – 2018-10-25 (×12): 15 [IU] via SUBCUTANEOUS
  Filled 2018-10-14 (×13): qty 0.15

## 2018-10-14 MED ORDER — VANCOMYCIN HCL 10 G IV SOLR
1500.0000 mg | Freq: Two times a day (BID) | INTRAVENOUS | Status: DC
  Administered 2018-10-15 – 2018-10-18 (×6): 1500 mg via INTRAVENOUS
  Filled 2018-10-14 (×7): qty 1500

## 2018-10-14 MED ORDER — OXYCODONE HCL 5 MG PO TABS
5.0000 mg | ORAL_TABLET | ORAL | Status: DC | PRN
  Administered 2018-10-14 – 2018-10-29 (×42): 5 mg via ORAL
  Filled 2018-10-14 (×43): qty 1

## 2018-10-14 MED ORDER — ATORVASTATIN CALCIUM 20 MG PO TABS
80.0000 mg | ORAL_TABLET | Freq: Every day | ORAL | Status: DC
  Administered 2018-10-14 – 2018-10-29 (×15): 80 mg via ORAL
  Filled 2018-10-14 (×15): qty 4

## 2018-10-14 MED ORDER — ACETAMINOPHEN 325 MG PO TABS
650.0000 mg | ORAL_TABLET | Freq: Four times a day (QID) | ORAL | Status: DC | PRN
  Administered 2018-10-17 – 2018-10-22 (×5): 650 mg via ORAL
  Filled 2018-10-14 (×5): qty 2

## 2018-10-14 MED ORDER — DOCUSATE SODIUM 100 MG PO CAPS
100.0000 mg | ORAL_CAPSULE | Freq: Two times a day (BID) | ORAL | Status: DC
  Administered 2018-10-14 – 2018-10-21 (×10): 100 mg via ORAL
  Filled 2018-10-14 (×10): qty 1

## 2018-10-14 MED ORDER — INSULIN ASPART 100 UNIT/ML ~~LOC~~ SOLN
0.0000 [IU] | Freq: Three times a day (TID) | SUBCUTANEOUS | Status: DC
  Administered 2018-10-15 – 2018-10-17 (×8): 3 [IU] via SUBCUTANEOUS
  Administered 2018-10-18: 5 [IU] via SUBCUTANEOUS
  Administered 2018-10-18: 3 [IU] via SUBCUTANEOUS
  Administered 2018-10-18 – 2018-10-19 (×2): 2 [IU] via SUBCUTANEOUS
  Administered 2018-10-19: 3 [IU] via SUBCUTANEOUS
  Administered 2018-10-20 – 2018-10-21 (×3): 5 [IU] via SUBCUTANEOUS
  Administered 2018-10-21 – 2018-10-22 (×2): 8 [IU] via SUBCUTANEOUS
  Administered 2018-10-22: 5 [IU] via SUBCUTANEOUS
  Administered 2018-10-23: 3 [IU] via SUBCUTANEOUS
  Administered 2018-10-23 (×2): 5 [IU] via SUBCUTANEOUS
  Administered 2018-10-24 (×2): 3 [IU] via SUBCUTANEOUS
  Administered 2018-10-24 – 2018-10-25 (×2): 5 [IU] via SUBCUTANEOUS
  Administered 2018-10-25: 8 [IU] via SUBCUTANEOUS
  Administered 2018-10-25 – 2018-10-26 (×2): 3 [IU] via SUBCUTANEOUS
  Administered 2018-10-26: 5 [IU] via SUBCUTANEOUS
  Administered 2018-10-27: 11 [IU] via SUBCUTANEOUS
  Administered 2018-10-27 (×2): 5 [IU] via SUBCUTANEOUS
  Administered 2018-10-28 (×2): 3 [IU] via SUBCUTANEOUS
  Administered 2018-10-29: 8 [IU] via SUBCUTANEOUS
  Administered 2018-10-29 (×2): 5 [IU] via SUBCUTANEOUS
  Administered 2018-10-30 (×2): 3 [IU] via SUBCUTANEOUS
  Filled 2018-10-14 (×38): qty 1

## 2018-10-14 MED ORDER — DICLOFENAC SODIUM 1 % TD GEL
2.0000 g | Freq: Four times a day (QID) | TRANSDERMAL | Status: DC
  Administered 2018-10-14 – 2018-10-30 (×50): 2 g via TOPICAL
  Filled 2018-10-14 (×2): qty 100

## 2018-10-14 MED ORDER — PIPERACILLIN-TAZOBACTAM 3.375 G IVPB
3.3750 g | Freq: Three times a day (TID) | INTRAVENOUS | Status: DC
  Administered 2018-10-14 – 2018-10-30 (×45): 3.375 g via INTRAVENOUS
  Filled 2018-10-14 (×45): qty 50

## 2018-10-14 MED ORDER — POLYETHYLENE GLYCOL 3350 17 G PO PACK
17.0000 g | PACK | Freq: Every day | ORAL | Status: DC
  Administered 2018-10-14 – 2018-10-20 (×5): 17 g via ORAL
  Filled 2018-10-14 (×6): qty 1

## 2018-10-14 MED ORDER — ONDANSETRON HCL 4 MG/2ML IJ SOLN
4.0000 mg | Freq: Four times a day (QID) | INTRAMUSCULAR | Status: DC | PRN
  Administered 2018-10-28: 4 mg via INTRAVENOUS
  Filled 2018-10-14: qty 2

## 2018-10-14 MED ORDER — ACETAMINOPHEN 650 MG RE SUPP: 650.0000 mg | Freq: Four times a day (QID) | RECTAL | Status: DC | PRN

## 2018-10-14 MED ORDER — INSULIN ASPART 100 UNIT/ML ~~LOC~~ SOLN
0.0000 [IU] | Freq: Every day | SUBCUTANEOUS | Status: DC
  Administered 2018-10-14 – 2018-10-21 (×8): 2 [IU] via SUBCUTANEOUS
  Administered 2018-10-22: 3 [IU] via SUBCUTANEOUS
  Administered 2018-10-25: 2 [IU] via SUBCUTANEOUS
  Administered 2018-10-26: 3 [IU] via SUBCUTANEOUS
  Administered 2018-10-27: 2 [IU] via SUBCUTANEOUS
  Administered 2018-10-28 – 2018-10-29 (×2): 3 [IU] via SUBCUTANEOUS
  Filled 2018-10-14 (×14): qty 1

## 2018-10-14 MED ORDER — NICOTINE 21 MG/24HR TD PT24
21.0000 mg | MEDICATED_PATCH | Freq: Every day | TRANSDERMAL | Status: DC
  Administered 2018-10-20 – 2018-10-25 (×4): 21 mg via TRANSDERMAL
  Filled 2018-10-14 (×9): qty 1

## 2018-10-14 MED ORDER — VANCOMYCIN HCL 10 G IV SOLR
2000.0000 mg | Freq: Once | INTRAVENOUS | Status: AC
  Administered 2018-10-14: 2000 mg via INTRAVENOUS
  Filled 2018-10-14: qty 2000

## 2018-10-14 MED ORDER — MORPHINE SULFATE (PF) 2 MG/ML IV SOLN
2.0000 mg | INTRAVENOUS | Status: DC | PRN
  Administered 2018-10-20 – 2018-10-30 (×20): 2 mg via INTRAVENOUS
  Filled 2018-10-14 (×21): qty 1

## 2018-10-14 MED ORDER — POLYETHYLENE GLYCOL 3350 17 G PO PACK
17.0000 g | PACK | Freq: Every day | ORAL | Status: DC | PRN
  Filled 2018-10-14: qty 1

## 2018-10-14 MED ORDER — HEPARIN SODIUM (PORCINE) 5000 UNIT/ML IJ SOLN
5000.0000 [IU] | Freq: Three times a day (TID) | INTRAMUSCULAR | Status: DC
  Administered 2018-10-14 – 2018-10-18 (×13): 5000 [IU] via SUBCUTANEOUS
  Filled 2018-10-14 (×13): qty 1

## 2018-10-14 MED ORDER — ONDANSETRON HCL 4 MG PO TABS: 4.0000 mg | ORAL_TABLET | Freq: Four times a day (QID) | ORAL | Status: DC | PRN

## 2018-10-14 NOTE — Progress Notes (Signed)
Pharmacy Antibiotic Note  Austin [name] is a 63 y.o. male admitted on 10/14/2018 with Wound Infection.  Pharmacy has been consulted for vancomycin and pip/tazo dosing.  Plan: Zosyn 3.375g IV q8h (4 hour infusion).  Will give vancomycin 2000 mg loading dose (~22mg /kg) followed by a 1500 mg q12h maintenance regimen. Predicted AUC is 437. Goal AUC is 400-550. Will obtain level at the 4th or 5th dose. Scr used 0.8 for calculation.     No data recorded.  No results for input(s): WBC, CREATININE, LATICACIDVEN, VANCOTROUGH, VANCOPEAK, VANCORANDOM, GENTTROUGH, GENTPEAK, GENTRANDOM, TOBRATROUGH, TOBRAPEAK, TOBRARND, AMIKACINPEAK, AMIKACINTROU, AMIKACIN in the last 168 hours.  Estimated Creatinine Clearance: 98.8 mL/min (by C-G formula based on SCr of 0.94 mg/dL).    No Known Allergies  Antimicrobials this admission: 1/8 pip/tazo >>  1/8 vanocmycin >>   Dose adjustments this admission: none  Microbiology results: None  Thank you for allowing pharmacy to be a part of this patient's care.  Ronnald Ramp, PharmD, BCPS Clinical Pharmacist 10/14/2018 5:04 PM

## 2018-10-14 NOTE — H&P (Signed)
[employer] [facility] - [place] at [facility]   PATIENT NAME: Austin [name]    MR#:  [id number]  DATE OF BIRTH:  09/13/1956  DATE OF ADMISSION:  10/14/2018  PRIMARY CARE PHYSICIAN: Neita Goodnightevelo, Presley RaddleAdrian Mancheno, MD   REQUESTING/REFERRING PHYSICIAN: Dr. Alberteen Spindleline  CHIEF COMPLAINT:  Foot wound  HISTORY OF PRESENT ILLNESS:  Austin [name]  is a 63 y.o. male with a known history of type 2 diabetes and history DVT who was directly admitted from the podiatry office for persistent left lower extremity diabetic foot infection.  He underwent debridement and first and second toe amputations on 09/30/2018.  He has been on antibiotics as an outpatient, but his wound has continued to worsen.  Underwent left lower extremity angiogram on 10/01/2018 with angioplasty and stent placement.  He denies any fevers or chills.  He endorses some mild left foot pain.  No lower extremity edema.  PAST MEDICAL HISTORY:   Past Medical History:  Diagnosis Date  . Diabetes (HCC)   . DVT (deep venous thrombosis) (HCC)     PAST SURGICAL HISTORY:   Past Surgical History:  Procedure Laterality Date  . AMPUTATION TOE Left 02/10/2018   Procedure: AMPUTATION TOE;  Surgeon: Linus Galasline, Todd, DPM;  Location: [facility] ORS;  Service: Podiatry;  Laterality: Left;  . groin surgery    . IRRIGATION AND DEBRIDEMENT FOOT Left 09/30/2018   Procedure: IRRIGATION AND DEBRIDEMENT FOOT;  Surgeon: Linus Galasline, Todd, DPM;  Location: [facility] ORS;  Service: Podiatry;  Laterality: Left;  . LOWER EXTREMITY ANGIOGRAPHY Left 02/12/2018   Procedure: Lower Extremity Angiography;  Surgeon: Annice Needyew, Jason S, MD;  Location: [facility] INVASIVE CV LAB;  Service: Cardiovascular;  Laterality: Left;  . LOWER EXTREMITY ANGIOGRAPHY Left 10/01/2018   Procedure: Lower Extremity Angiography;  Surgeon: Annice Needyew, Jason S, MD;  Location: [facility] INVASIVE CV LAB;  Service: Cardiovascular;  Laterality: Left;  . LOWER EXTREMITY INTERVENTION  02/12/2018   Procedure: LOWER EXTREMITY INTERVENTION;   Surgeon: Annice Needyew, Jason S, MD;  Location: [facility] INVASIVE CV LAB;  Service: Cardiovascular;;  . NECK SURGERY      SOCIAL HISTORY:   Social History   Tobacco Use  . Smoking status: Heavy Tobacco Smoker    Packs/day: 1.00    Years: 30.00    Pack years: 30.00    Types: Cigarettes  . Smokeless tobacco: Never Used  Substance Use Topics  . Alcohol use: No    FAMILY HISTORY:   Family History  Problem Relation Age of Onset  . Diabetes Mother   . Coronary artery disease Father   . Hypertension Sister   . Leukemia Brother     DRUG ALLERGIES:  No Known Allergies  REVIEW OF SYSTEMS:   Review of Systems  Constitutional: Negative for chills and fever.  HENT: Negative for congestion and sore throat.   Eyes: Negative for blurred vision and double vision.  Respiratory: Negative for cough and shortness of breath.   Cardiovascular: Negative for chest pain, palpitations and leg swelling.  Gastrointestinal: Negative for nausea and vomiting.  Genitourinary: Negative for dysuria and urgency.  Musculoskeletal: Positive for joint pain. Negative for neck pain.  Neurological: Negative for dizziness and headaches.  Psychiatric/Behavioral: Negative for depression. The patient is not nervous/anxious.     MEDICATIONS AT HOME:   Prior to Admission medications   Medication Sig Start Date End Date Taking? Authorizing Provider  amoxicillin-clavulanate (AUGMENTIN) 875-125 MG tablet Take 1 tablet by mouth 2 (two) times daily. 10/04/18   Milagros LollSudini, Srikar, MD  apixaban Everlene Balls(ELIQUIS) 5  MG TABS tablet Take 1 tablet (5 mg total) by mouth 2 (two) times daily. 02/12/18   Ramonita Lab, MD  atorvastatin (LIPITOR) 80 MG tablet Take 1 tablet (80 mg total) by mouth daily at 6 PM. 02/12/18   Gouru, Aruna, MD  clopidogrel (PLAVIX) 75 MG tablet Take 1 tablet (75 mg total) by mouth daily. 02/12/18   Ramonita Lab, MD  diclofenac sodium (VOLTAREN) 1 % GEL Apply 2 g topically 4 (four) times daily. 10/11/18   Phineas Semen, MD   docusate sodium (COLACE) 100 MG capsule Take 1 capsule (100 mg total) by mouth 2 (two) times daily. 10/04/18   Milagros Loll, MD  doxycycline (VIBRAMYCIN) 100 MG capsule Take 1 capsule (100 mg total) by mouth 2 (two) times daily. 10/04/18   Milagros Loll, MD  insulin glargine (LANTUS) 100 UNIT/ML injection Inject 0.15 mLs (15 Units total) into the skin daily. 02/13/18   Gouru, Deanna Artis, MD  nicotine (NICODERM CQ - DOSED IN MG/24 HOURS) 21 mg/24hr patch Place 1 patch (21 mg total) onto the skin daily. 02/12/18   Gouru, Deanna Artis, MD  polyethylene glycol (MIRALAX) packet Take 17 g by mouth daily. 10/04/18   Milagros Loll, MD      VITAL SIGNS:  Blood pressure 104/81, pulse 88, resp. rate 18, SpO2 100 %.  PHYSICAL EXAMINATION:  Physical Exam  GENERAL:  63 y.o.-year-old patient lying lying in the bed with no acute distress.  EYES: Pupils equal, round, reactive to light and accommodation. No scleral icterus. Extraocular muscles intact.  HEENT: Head atraumatic, normocephalic. Oropharynx and nasopharynx clear.  NECK:  Supple, no jugular venous distention. No thyroid enlargement, no tenderness.  LUNGS: Normal breath sounds bilaterally, no wheezing, rales,rhonchi or crepitation. No use of accessory muscles of respiration.  CARDIOVASCULAR: S1, S2 normal. No murmurs, rubs, or gallops.  ABDOMEN: Soft, nontender, nondistended. Bowel sounds present. No organomegaly or mass.  EXTREMITIES: No pedal edema, cyanosis, or clubbing. 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 NEUROLOGIC: Cranial nerves II through XII are intact. Muscle strength 5/5 in all extremities. Sensation intact. Gait not checked.  PSYCHIATRIC: The patient is alert and oriented x 3.  SKIN: Left foot wound as seen below       LABORATORY PANEL:   CBC Recent Labs  Lab 10/14/18 1750  WBC 9.8  HGB 12.9*  HCT 38.6*  PLT 274    ------------------------------------------------------------------------------------------------------------------  Chemistries  Recent Labs  Lab 10/14/18 1750  NA 131*  K 3.9  CL 99  CO2 26  GLUCOSE 242*  BUN 12  CREATININE 0.79  CALCIUM 9.0   ------------------------------------------------------------------------------------------------------------------  Cardiac Enzymes No results for input(s): TROPONINI in the last 168 hours. ------------------------------------------------------------------------------------------------------------------  RADIOLOGY:  No results found.    IMPRESSION AND PLAN:   Left diabetic foot wound- s/p debridement and first and second ray amputation on 12/25.  S/p left lower extremity angiogram 12/26. -Podiatry consult- will likely need transmetatarsal amputation -Start empiric IV antibiotics -Pain control  Uncontrolled type 2 diabetes- recent A1c 8.1%. -Lantus 15 units nightly and SSI  History of DVT- unclear when this occurred -Hold Eliquis for surgery -Subcutaneous heparin for DVT prophylaxis after discussion with pharmacy  Peripheral vascular disease- s/p angiogram 12/26 by Dr. Wyn Quaker. -Holding Plavix for surgery  Hyperlipidemia-stable -Continue home Lipitor  All the records are reviewed and case discussed with ED provider. Management plans discussed with the patient, family and they are in agreement.  CODE STATUS: Full  TOTAL TIME  TAKING CARE OF THIS PATIENT: 45 minutes.    Jinny BlossomKaty D Rebeccah Ivins M.D on 10/14/2018 at 7:15 PM  Between 7am to 6pm - Pager - [phone]  After 6pm go to [email or web address] - Social research officer, governmentpassword [email or web address] [facility]  [facility] [place] [facility]  Office  [phone]  CC: Primary care physician; [name], [name], MD   Note: This dictation was prepared with Dragon dictation along with smaller phrase technology. Any transcriptional errors that result from this process are unintentional.

## 2018-10-15 LAB — BASIC METABOLIC PANEL
Anion gap: 6 (ref 5–15)
BUN: 14 mg/dL (ref 8–23)
CALCIUM: 8.9 mg/dL (ref 8.9–10.3)
CO2: 28 mmol/L (ref 22–32)
Chloride: 102 mmol/L (ref 98–111)
Creatinine, Ser: 0.97 mg/dL (ref 0.61–1.24)
GFR calc Af Amer: >60 mL/min (ref >60)
GFR calc non Af Amer: >60 mL/min (ref >60)
Glucose, Bld: 222 mg/dL — ABNORMAL HIGH (ref 70–99)
Potassium: 4.1 mmol/L (ref 3.5–5.1)
Sodium: 136 mmol/L (ref 135–145)

## 2018-10-15 LAB — CBC
HCT: 37.6 % — ABNORMAL LOW (ref 39.0–52.0)
Hemoglobin: 12.4 g/dL — ABNORMAL LOW (ref 13.0–17.0)
MCH: 29.2 pg (ref 26.0–34.0)
MCHC: 33 g/dL (ref 30.0–36.0)
MCV: 88.5 fL (ref 80.0–100.0)
Platelets: 268 10*3/uL (ref 150–400)
RBC: 4.25 MIL/uL (ref 4.22–5.81)
RDW: 12.1 % (ref 11.5–15.5)
WBC: 9 10*3/uL (ref 4.0–10.5)
nRBC: 0 % (ref 0.0–0.2)

## 2018-10-15 LAB — GLUCOSE, CAPILLARY
GLUCOSE-CAPILLARY: 196 mg/dL — AB (ref 70–99)
Glucose-Capillary: 156 mg/dL — ABNORMAL HIGH (ref 70–99)
Glucose-Capillary: 194 mg/dL — ABNORMAL HIGH (ref 70–99)
Glucose-Capillary: 225 mg/dL — ABNORMAL HIGH (ref 70–99)

## 2018-10-15 LAB — MRSA PCR SCREENING: MRSA by PCR: NEGATIVE

## 2018-10-15 MED ORDER — CHLORHEXIDINE GLUCONATE 4 % EX LIQD: 60.0000 mL | Freq: Once | CUTANEOUS | Status: DC

## 2018-10-15 MED ORDER — CHLORHEXIDINE GLUCONATE 4 % EX LIQD
60.0000 mL | Freq: Once | CUTANEOUS | Status: AC
  Administered 2018-10-16: 4 via TOPICAL

## 2018-10-15 MED ORDER — POVIDONE-IODINE 10 % EX SWAB: 2.0000 "application " | Freq: Once | CUTANEOUS | Status: DC

## 2018-10-15 NOTE — Progress Notes (Signed)
[facility]   On 10/01/18, the patient underwent an ultrasound guidance for vascular access right femoral artery, catheter placement into left common femoral artery from right femoral approach, aortogram and selective left lower extremity angiogram including selective imaging of the anterior tibial and posterior tibial arteries, percutaneous transluminal angioplasty of the left anterior tibial artery with 2.5 mm diameter by 30 cm length angioplasty balloon, percutaneous transluminal angioplasty of the entire left SFA and popliteal artery with two 5 mm diameter by 22 cm length [name] drug-coated angioplasty balloons, percutaneous transluminal angioplasty of the left tibioperoneal trunk in the proximal to mid posterior tibial artery with 3 mm diameter by 30 cm length angioplasty balloon and then the tibioperoneal trunk and proximal posterior tibial artery with a 4 mm diameter by 15 cm length [name] drug-coated angioplasty balloon, viabahn stent placement x3 to the left SFA and popliteal arteries for residual stenosis after angioplasty.  These were 6 mm in diameter and 2 were 25 cm in length with the most proximal stent being 5 cm in length with StarClose closure device right femoral artery.  Patient is scheduled to undergo a possible transmetatarsal amputation with podiatry tomorrow. If there is poor blood flow noted to the amputation site during surgery we will plan on repeating an angiogram early next week.   Discussed with Dr. Wyn Quaker / Charlie Pitter Ivery Nanney PA-C 10/15/2018 4:20 PM

## 2018-10-15 NOTE — Progress Notes (Signed)
[employer] [facility] - [place] at [facility]   PATIENT NAME: Austin [name]    MR#:  [id number]  DATE OF BIRTH:  05-14-1956  SUBJECTIVE:  CHIEF COMPLAINT:  No chief complaint on file. Came with worsening wound on foot.  REVIEW OF SYSTEMS:  CONSTITUTIONAL: No fever, fatigue or weakness.  EYES: No blurred or double vision.  EARS, NOSE, AND THROAT: No tinnitus or ear pain.  RESPIRATORY: No cough, shortness of breath, wheezing or hemoptysis.  CARDIOVASCULAR: No chest pain, orthopnea, edema.  GASTROINTESTINAL: No nausea, vomiting, diarrhea or abdominal pain.  GENITOURINARY: No dysuria, hematuria.  ENDOCRINE: No polyuria, nocturia,  HEMATOLOGY: No anemia, easy bruising or bleeding SKIN: No rash or lesion. MUSCULOSKELETAL: No joint pain or arthritis.   NEUROLOGIC: No tingling, numbness, weakness.  PSYCHIATRY: No anxiety or depression.   ROS  DRUG ALLERGIES:  No Known Allergies  VITALS:  Blood pressure (!) 108/96, pulse 71, temperature 98 F (36.7 C), resp. rate 18, SpO2 100 %.  PHYSICAL EXAMINATION:  GENERAL:  63 y.o.-year-old patient lying in the bed with no acute distress.  EYES: Pupils equal, round, reactive to light and accommodation. No scleral icterus. Extraocular muscles intact.  HEENT: Head atraumatic, normocephalic. Oropharynx and nasopharynx clear.  NECK:  Supple, no jugular venous distention. No thyroid enlargement, no tenderness.  LUNGS: Normal breath sounds bilaterally, no wheezing, rales,rhonchi or crepitation. No use of accessory muscles of respiration.  CARDIOVASCULAR: S1, S2 normal. No murmurs, rubs, or gallops.  ABDOMEN: Soft, nontender, nondistended. Bowel sounds present. No organomegaly or mass.  EXTREMITIES: No pedal edema, cyanosis, or clubbing.left foot- toe amputations and some dressing on the foot.  NEUROLOGIC: Cranial nerves II through XII are intact. Muscle strength 4/5 in all extremities. Sensation intact. Gait not checked.  PSYCHIATRIC: The  patient is alert and oriented x 3.  SKIN: No obvious rash, lesion, or ulcer.   Physical Exam LABORATORY PANEL:   CBC Recent Labs  Lab 10/15/18 0441  WBC 9.0  HGB 12.4*  HCT 37.6*  PLT 268   ------------------------------------------------------------------------------------------------------------------  Chemistries  Recent Labs  Lab 10/15/18 0441  NA 136  K 4.1  CL 102  CO2 28  GLUCOSE 222*  BUN 14  CREATININE 0.97  CALCIUM 8.9   ------------------------------------------------------------------------------------------------------------------  Cardiac Enzymes No results for input(s): TROPONINI in the last 168 hours. ------------------------------------------------------------------------------------------------------------------  RADIOLOGY:  No results found.  ASSESSMENT AND PLAN:   Active Problems:   Diabetic foot infection (HCC)  * Left diabetic foot wound- s/p debridement and first and second ray amputation on 12/25.  S/p left lower extremity angiogram 12/26. -  Podiatry consult- transmetatarsal amputation tomorrow. - Start empiric IV antibiotics - Pain control  * Uncontrolled type 2 diabetes- recent A1c 8.1%. - Lantus 15 units nightly and SSI  * History of DVT- unclear when this occurred -Hold Eliquis for surgery -Subcutaneous heparin for DVT prophylaxis   * Peripheral vascular disease- s/p angiogram 12/26 by Dr. Wyn Quaker. -Holding Plavix for surgery  * Hyperlipidemia-stable -Continue home Lipitor   All the records are reviewed and case discussed with Care Management/Social Workerr. Management plans discussed with the patient, family and they are in agreement.  CODE STATUS: Full.  TOTAL TIME TAKING CARE OF THIS PATIENT: 35 minutes.    POSSIBLE D/C IN *1-2 DAYS, DEPENDING ON CLINICAL CONDITION.   Altamese Dilling M.D on 10/15/2018   Between 7am to 6pm - Pager - [phone]  After 6pm go to [email or web address] - password [email or web address]  [facility]  [employer]  [phone]  CC: Primary care physician; Preston Fleeting, MD  Note: This dictation was prepared with Dragon dictation along with smaller phrase technology. Any transcriptional errors that result from this process are unintentional.

## 2018-10-15 NOTE — Progress Notes (Signed)
Clinical Education officer, museum (CSW) met with patient and presented bed offers. Patient chose [name]. Per [facility] admissions coordinator at [name] they were going to accept patient from home yesterday prior to his admission to [facility]. Per Claiborne Billings she will start [facility] SNF authorization tomorrow due to patient having a toe amputation tomorrow.   [employer], LCSW [phone]

## 2018-10-15 NOTE — Clinical Social Work Placement (Signed)
   CLINICAL SOCIAL WORK PLACEMENT  NOTE  Date:  10/15/2018  Patient Details  Name: Austin [name] MRN: [id number] Date of Birth: 1956/06/26  Clinical Social Work is seeking post-discharge placement for this patient at the Skilled  Nursing Facility level of care (*CSW will initial, date and re-position this form in  chart as items are completed):  Yes   Patient/family provided with [place] Clinical Social Work Department's list of facilities offering this level of care within the geographic area requested by the patient (or if unable, by the patient's family).  Yes   Patient/family informed of their freedom to choose among providers that offer the needed level of care, that participate in Medicare, Medicaid or managed care program needed by the patient, have an available bed and are willing to accept the patient.  Yes   Patient/family informed of [facility]'s ownership interest in [facility] and [facility], as well as of the fact that they are under no obligation to receive care at these facilities.  PASRR submitted to EDS on       PASRR number received on       Existing PASRR number confirmed on 10/15/18     FL2 transmitted to all facilities in geographic area requested by pt/family on 10/15/18     FL2 transmitted to all facilities within larger geographic area on       Patient informed that his/her managed care company has contracts with or will negotiate with certain facilities, including the following:            Patient/family informed of bed offers received.  Patient chooses bed at       Physician recommends and patient chooses bed at      Patient to be transferred to   on  .  Patient to be transferred to facility by       Patient family notified on   of transfer.  Name of family member notified:        PHYSICIAN       Additional Comment:    _______________________________________________ Edgardo Petrenko, Darleen Crocker, LCSW 10/15/2018, 11:26 AM

## 2018-10-15 NOTE — Clinical Social Work Note (Signed)
Clinical Social Work Assessment  Patient Details  Name: Austin [name] MRN: [id number] Date of Birth: 09/04/1956  Date of referral:  10/15/18               Reason for consult:  Facility Placement                Permission sought to share information with:  Chartered certified accountant granted to share information::  Yes, Verbal Permission Granted  Name::      [place]::   [place]   Relationship::     Contact Information:     Housing/Transportation Living arrangements for the past 2 months:  [place] of Information:  Patient Patient Interpreter Needed:  None Criminal Activity/Legal Involvement Pertinent to Current Situation/Hospitalization:  No - Comment as needed Significant Relationships:  Adult Children, Siblings Lives with:  Self Do you feel safe going back to the place where you live?  Yes Need for family participation in patient care:  Yes (Comment)  Care giving concerns:  Patient lives alone in [place].    Social Worker assessment / plan:  Holiday representative (CSW) reviewed chart and noted that patient is a readmission. PT recommended SNF on patient's last admission and he declined SNF and discharged home. Per RN patient will have a toe amputation tomorrow. CSW met with patient alone at bedside to discuss D/C plan. Patient was alert and oriented X4 and was laying in the bed. CSW introduced self and explained role of CSW department. Patient reported that he has not been doing well at home and would like to go to SNF this time. CSW explained that PT will evaluate patient and make a recommendation of home health or SNF. CSW explained that [facility] will have to approve SNF. Patient verbalized his understanding and is agreeable to SNF search in [place]. FL2 complete and faxed out. CSW will continue to follow and assist as needed.   Employment status:  Disabled (Comment on whether or not currently receiving  Disability), Retired Nurse, adult PT Recommendations:  Not assessed at this time Information / Referral to community resources:  [place]  Patient/Family's Response to care:  Patient requested to go to SNF.   Patient/Family's Understanding of and Emotional Response to Diagnosis, Current Treatment, and Prognosis:  Patient was very pleasant and thanked CSW for assistance.   Emotional Assessment Appearance:  Appears stated age Attitude/Demeanor/Rapport:    Affect (typically observed):  Accepting, Adaptable, Pleasant Orientation:  Oriented to Self, Oriented to  Time, Oriented to Place, Oriented to Situation Alcohol / Substance use:  Not Applicable Psych involvement (Current and /or in the community):  No (Comment)  Discharge Needs  Concerns to be addressed:  Discharge Planning Concerns Readmission within the last 30 days:  Yes Current discharge risk:  Dependent with Mobility Barriers to Discharge:  Continued Medical Work up   [employer], Veronia Beets, LCSW 10/15/2018, 11:28 AM

## 2018-10-15 NOTE — [place] FL2 (Signed)
[place] MEDICAID FL2 LEVEL OF CARE SCREENING TOOL     IDENTIFICATION  Patient Name: Austin [name] Birthdate: 1956-04-27 Sex: male Admission Date (Current Location): 10/14/2018  [place] and [place] Number:  Chiropodist and Address:  [facility], [address], [place], [place] [address]      Provider Number: [id number]  Attending Physician Name and Address:  Altamese Dilling, *  Relative Name and Phone Number:       Current Level of Care: Hospital Recommended Level of Care: Skilled Nursing Facility Prior Approval Number:    Date Approved/Denied:   PASRR Number: [id number])  Discharge Plan: SNF    Current Diagnoses: Patient Active Problem List   Diagnosis Date Noted  . Diabetic foot infection (HCC) 10/14/2018  . Malnutrition of moderate degree 10/02/2018  . Foot ulcer (HCC) 09/29/2018  . Gangrene of left foot (HCC) 02/09/2018  . Atherosclerosis of native arteries of extremity with intermittent claudication (HCC) 05/25/2017  . Diabetes (HCC) 05/25/2017  . Essential hypertension 05/25/2017  . Hyperlipidemia 05/25/2017    Orientation RESPIRATION BLADDER Height & Weight     Self, Time, Situation, Place  Normal Continent Weight:   Height:     BEHAVIORAL SYMPTOMS/MOOD NEUROLOGICAL BOWEL NUTRITION STATUS      Continent Diet(Diet: Heart Healthy/ Carb Modified. )  AMBULATORY STATUS COMMUNICATION OF NEEDS Skin   Extensive Assist Verbally PU Stage and Appropriate Care(Diabetic left foot ulcer )                       Personal Care Assistance Level of Assistance  Bathing, Feeding, Dressing Bathing Assistance: Limited assistance Feeding assistance: Independent Dressing Assistance: Limited assistance     Functional Limitations Info  Sight, Hearing, Speech Sight Info: Impaired Hearing Info: Adequate Speech Info: Adequate    SPECIAL CARE FACTORS FREQUENCY  PT (By licensed PT), OT (By licensed OT)     PT Frequency:  (5) OT Frequency: (5)            Contractures      Additional Factors Info  Code Status, Allergies Code Status Info: (Full Code. ) Allergies Info: (No Known Allergies. )           Current Medications (10/15/2018):  This is the current hospital active medication list Current Facility-Administered Medications  Medication Dose Route Frequency Provider Last Rate Last Dose  . acetaminophen (TYLENOL) tablet 650 mg  650 mg Oral Q6H PRN Mayo, Allyn Kenner, MD       Or  . acetaminophen (TYLENOL) suppository 650 mg  650 mg Rectal Q6H PRN Mayo, Allyn Kenner, MD      . atorvastatin (LIPITOR) tablet 80 mg  80 mg Oral q1800 Mayo, Allyn Kenner, MD   80 mg at 10/14/18 1813  . diclofenac sodium (VOLTAREN) 1 % transdermal gel 2 g  2 g Topical QID Mayo, Allyn Kenner, MD   2 g at 10/15/18 0930  . docusate sodium (COLACE) capsule 100 mg  100 mg Oral BID Campbell Stall, MD   100 mg at 10/15/18 0931  . heparin injection 5,000 Units  5,000 Units Subcutaneous Q8H Mayo, Allyn Kenner, MD   5,000 Units at 10/15/18 [phone]  . insulin aspart (novoLOG) injection 0-15 Units  0-15 Units Subcutaneous TID WC Mayo, Allyn Kenner, MD   3 Units at 10/15/18 0930  . insulin aspart (novoLOG) injection 0-5 Units  0-5 Units Subcutaneous QHS Campbell Stall, MD   2 Units at 10/14/18 2128  .  insulin glargine (LANTUS) injection 15 Units  15 Units Subcutaneous QHS Campbell Stall, MD   15 Units at 10/14/18 2128  . morphine 2 MG/ML injection 2 mg  2 mg Intravenous Q3H PRN Mayo, Allyn Kenner, MD      . nicotine (NICODERM CQ - dosed in mg/24 hours) patch 21 mg  21 mg Transdermal Daily Mayo, Allyn Kenner, MD      . ondansetron [facility]) tablet 4 mg  4 mg Oral Q6H PRN Mayo, Allyn Kenner, MD       Or  . ondansetron [facility]) injection 4 mg  4 mg Intravenous Q6H PRN Mayo, Allyn Kenner, MD      . oxyCODONE (Oxy IR/ROXICODONE) immediate release tablet 5 mg  5 mg Oral Q4H PRN Mayo, Allyn Kenner, MD   5 mg at 10/15/18 0942  . piperacillin-tazobactam (ZOSYN) IVPB 3.375 g   3.375 g Intravenous Q8H Ronnald Ramp, RPH 12.5 mL/hr at 10/15/18 0932 3.375 g at 10/15/18 0932  . polyethylene glycol (MIRALAX / GLYCOLAX) packet 17 g  17 g Oral Daily Mayo, Allyn Kenner, MD   17 g at 10/15/18 0931  . polyethylene glycol (MIRALAX / GLYCOLAX) packet 17 g  17 g Oral Daily PRN Mayo, Allyn Kenner, MD      . vancomycin (VANCOCIN) 1,500 mg in sodium chloride 0.9 % 500 mL IVPB  1,500 mg Intravenous Q12H Ronnald Ramp, [facility]         Discharge Medications: Please see discharge summary for a list of discharge medications.  Relevant Imaging Results:  Relevant Lab Results:   Additional Information (SSN: [id number])  Anjani Feuerborn, Darleen Crocker, LCSW

## 2018-10-15 NOTE — Consult Note (Signed)
ORTHOPAEDIC CONSULTATION  REQUESTING PHYSICIAN: Altamese Dilling, *  Chief Complaint: Left foot necrosis and infection  HPI: Austin [name] is a 63 y.o. male who complains of worsening necrotic wound to the left foot.  Patient underwent left second toe amputation in December.  Since that time the wound has dehisced and become necrotic with obvious necrotic tissue and infection.  Admitted to the hospital for IV antibiotics and further debridement of the wound.  Complain of continued pain into the left foot.  Past Medical History:  Diagnosis Date  . Diabetes (HCC)   . DVT (deep venous thrombosis) (HCC)    Past Surgical History:  Procedure Laterality Date  . AMPUTATION TOE Left 02/10/2018   Procedure: AMPUTATION TOE;  Surgeon: Linus Galas, DPM;  Location: [facility] ORS;  Service: Podiatry;  Laterality: Left;  . groin surgery    . IRRIGATION AND DEBRIDEMENT FOOT Left 09/30/2018   Procedure: IRRIGATION AND DEBRIDEMENT FOOT;  Surgeon: Linus Galas, DPM;  Location: [facility] ORS;  Service: Podiatry;  Laterality: Left;  . LOWER EXTREMITY ANGIOGRAPHY Left 02/12/2018   Procedure: Lower Extremity Angiography;  Surgeon: Annice Needy, MD;  Location: [facility] INVASIVE [facility];  Service: Cardiovascular;  Laterality: Left;  . LOWER EXTREMITY ANGIOGRAPHY Left 10/01/2018   Procedure: Lower Extremity Angiography;  Surgeon: Annice Needy, MD;  Location: [facility];  Service: Cardiovascular;  Laterality: Left;  . LOWER EXTREMITY INTERVENTION  02/12/2018   Procedure: LOWER EXTREMITY INTERVENTION;  Surgeon: Annice Needy, MD;  Location: [facility];  Service: Cardiovascular;;  . NECK SURGERY     Social History   Socioeconomic History  . Marital status: Divorced    Spouse name: Not on file  . Number of children: Not on file  . Years of education: Not on file  . Highest education level: Not on file  Occupational History  . Not on file  Social Needs  . Financial resource strain: Not on file  .  Food insecurity:    Worry: Not on file    Inability: Not on file  . Transportation needs:    Medical: Not on file    Non-medical: Not on file  Tobacco Use  . Smoking status: Heavy Tobacco Smoker    Packs/day: 1.00    Years: 30.00    Pack years: 30.00    Types: Cigarettes  . Smokeless tobacco: Never Used  Substance and Sexual Activity  . Alcohol use: No  . Drug use: No  . Sexual activity: Not Currently  Lifestyle  . Physical activity:    Days per week: Not on file    Minutes per session: Not on file  . Stress: Not on file  Relationships  . Social connections:    Talks on phone: Not on file    Gets together: Not on file    Attends religious service: Not on file    Active member of club or organization: Not on file    Attends meetings of clubs or organizations: Not on file    Relationship status: Not on file  Other Topics Concern  . Not on file  Social History Narrative  . Not on file   Family History  Problem Relation Age of Onset  . Diabetes Mother   . Coronary artery disease Father   . Hypertension Sister   . Leukemia Brother    No Known Allergies Prior to Admission medications   Medication Sig Start Date End Date Taking? Authorizing Provider  amoxicillin-clavulanate (AUGMENTIN) 875-125 MG  tablet Take 1 tablet by mouth 2 (two) times daily. 10/04/18   Milagros LollSudini, Srikar, MD  apixaban (ELIQUIS) 5 MG TABS tablet Take 1 tablet (5 mg total) by mouth 2 (two) times daily. 02/12/18   Ramonita LabGouru, Aruna, MD  atorvastatin (LIPITOR) 80 MG tablet Take 1 tablet (80 mg total) by mouth daily at 6 PM. 02/12/18   Gouru, Aruna, MD  clopidogrel (PLAVIX) 75 MG tablet Take 1 tablet (75 mg total) by mouth daily. 02/12/18   Ramonita LabGouru, Aruna, MD  diclofenac sodium (VOLTAREN) 1 % GEL Apply 2 g topically 4 (four) times daily. 10/11/18   Phineas SemenGoodman, Graydon, MD  docusate sodium (COLACE) 100 MG capsule Take 1 capsule (100 mg total) by mouth 2 (two) times daily. 10/04/18   Milagros LollSudini, Srikar, MD  doxycycline (VIBRAMYCIN)  100 MG capsule Take 1 capsule (100 mg total) by mouth 2 (two) times daily. 10/04/18   Milagros LollSudini, Srikar, MD  insulin glargine (LANTUS) 100 UNIT/ML injection Inject 0.15 mLs (15 Units total) into the skin daily. 02/13/18   Gouru, Deanna ArtisAruna, MD  nicotine (NICODERM CQ - DOSED IN MG/24 HOURS) 21 mg/24hr patch Place 1 patch (21 mg total) onto the skin daily. 02/12/18   Gouru, Deanna ArtisAruna, MD  polyethylene glycol (MIRALAX) packet Take 17 g by mouth daily. 10/04/18   Milagros LollSudini, Srikar, MD   No results found.  Positive ROS: All other systems have been reviewed and were otherwise negative with the exception of those mentioned in the HPI and as above.  12 point ROS was performed.  Physical Exam: General: Alert and oriented.  No apparent distress.  Vascular:  Left foot:Dorsalis Pedis:  absent Posterior Tibial:  absent  Right foot: Dorsalis Pedis:  absent Posterior Tibial:  absent  Neuro:intact gross sensation to the left foot.  Some protective sensation is absent.  Derm: The left second toe amputation site is completely necrotic with deep necrotic tissue as well.  The third toe is also becoming necrotic as well.  Rounding erythema is noted.  Ortho/MS: Patient has contracture of the left leg at the knee.  He demonstrated active dorsi and plantar flexion of the left foot.  Assessment: Necrotic amputation site left second toe with necrotic left third toe  Plan: At this time we will plan for debridement of this left foot.  I discussed with the patient that I will attempt to debride the wound and amputate the third toe and see if we can minimize the surgical intervention to that.  If there is no salvage and further necrosis is deep will likely perform a transmetatarsal amputation.  This will be an intraoperative decision.  I discussed this with the patient in great detail.  We will place him n.p.o. after midnight tonight and plan for surgery tomorrow.  Also going to reconsult vascular surgery just for their opinion.  He  just underwent revascularization on 26 December.    [name], Angelic Schnelle A, DPM Cell [phone]   10/15/2018 12:35 PM

## 2018-10-16 ENCOUNTER — Encounter: Payer: Self-pay | Admitting: Podiatry

## 2018-10-16 ENCOUNTER — Inpatient Hospital Stay: Admit: 2018-10-16 | Payer: Medicare Other | Admitting: Podiatry

## 2018-10-16 ENCOUNTER — Inpatient Hospital Stay: Payer: Medicare Other | Admitting: Certified Registered"

## 2018-10-16 ENCOUNTER — Encounter
Admission: AD | Disposition: A | Discharge status: Skilled Nursing Facility | Payer: Self-pay | Source: Home / Self Care | Attending: Internal Medicine

## 2018-10-16 HISTORY — PX: APPLICATION OF WOUND VAC: SHX5189

## 2018-10-16 HISTORY — PX: TRANSMETATARSAL AMPUTATION: SHX6197

## 2018-10-16 LAB — GLUCOSE, CAPILLARY
GLUCOSE-CAPILLARY: 165 mg/dL — AB (ref 70–99)
GLUCOSE-CAPILLARY: 174 mg/dL — AB (ref 70–99)
Glucose-Capillary: 107 mg/dL — ABNORMAL HIGH (ref 70–99)
Glucose-Capillary: 100 mg/dL — ABNORMAL HIGH (ref 70–99)
Glucose-Capillary: 227 mg/dL — ABNORMAL HIGH (ref 70–99)

## 2018-10-16 SURGERY — AMPUTATION, FOOT, TRANSMETATARSAL
Anesthesia: General | Site: Foot | Laterality: Left

## 2018-10-16 MED ORDER — GENTAMICIN SULFATE 40 MG/ML IJ SOLN
INTRAMUSCULAR | Status: AC
  Filled 2018-10-16: qty 4

## 2018-10-16 MED ORDER — LIDOCAINE HCL 1 % IJ SOLN
INTRAMUSCULAR | Status: DC | PRN
  Administered 2018-10-16: 10 mL

## 2018-10-16 MED ORDER — FENTANYL CITRATE (PF) 100 MCG/2ML IJ SOLN
INTRAMUSCULAR | Status: DC | PRN
  Administered 2018-10-16: 50 ug via INTRAVENOUS
  Administered 2018-10-16 (×2): 25 ug via INTRAVENOUS

## 2018-10-16 MED ORDER — FENTANYL CITRATE (PF) 100 MCG/2ML IJ SOLN
INTRAMUSCULAR | Status: AC
  Administered 2018-10-16: 10:00:00
  Filled 2018-10-16: qty 2

## 2018-10-16 MED ORDER — FENTANYL CITRATE (PF) 100 MCG/2ML IJ SOLN
25.0000 ug | INTRAMUSCULAR | Status: DC | PRN
  Administered 2018-10-16: 25 ug via INTRAVENOUS

## 2018-10-16 MED ORDER — BUPIVACAINE HCL (PF) 0.25 % IJ SOLN
INTRAMUSCULAR | Status: AC
  Filled 2018-10-16: qty 30

## 2018-10-16 MED ORDER — PROPOFOL 10 MG/ML IV BOLUS
INTRAVENOUS | Status: DC | PRN
  Administered 2018-10-16: 160 mg via INTRAVENOUS

## 2018-10-16 MED ORDER — PHENYLEPHRINE HCL 10 MG/ML IJ SOLN
INTRAMUSCULAR | Status: AC
  Filled 2018-10-16: qty 1

## 2018-10-16 MED ORDER — BUPIVACAINE HCL (PF) 0.5 % IJ SOLN
INTRAMUSCULAR | Status: AC
  Filled 2018-10-16: qty 30

## 2018-10-16 MED ORDER — ONDANSETRON HCL 4 MG/2ML IJ SOLN
INTRAMUSCULAR | Status: DC | PRN
  Administered 2018-10-16: 4 mg via INTRAVENOUS

## 2018-10-16 MED ORDER — MIDAZOLAM HCL 2 MG/2ML IJ SOLN
INTRAMUSCULAR | Status: DC | PRN
  Administered 2018-10-16: 1 mg via INTRAVENOUS

## 2018-10-16 MED ORDER — LIDOCAINE HCL (CARDIAC) PF 100 MG/5ML IV SOSY
PREFILLED_SYRINGE | INTRAVENOUS | Status: DC | PRN
  Administered 2018-10-16: 80 mg via INTRAVENOUS

## 2018-10-16 MED ORDER — PROPOFOL 10 MG/ML IV BOLUS
INTRAVENOUS | Status: AC
  Filled 2018-10-16: qty 20

## 2018-10-16 MED ORDER — OXYCODONE HCL 5 MG PO TABS: 5.0000 mg | ORAL_TABLET | Freq: Once | ORAL | Status: DC | PRN

## 2018-10-16 MED ORDER — LIDOCAINE-EPINEPHRINE 1 %-1:100000 IJ SOLN
INTRAMUSCULAR | Status: AC
  Filled 2018-10-16: qty 1

## 2018-10-16 MED ORDER — OXYCODONE HCL 5 MG/5ML PO SOLN: 5.0000 mg | Freq: Once | ORAL | Status: DC | PRN

## 2018-10-16 MED ORDER — MEPERIDINE HCL 50 MG/ML IJ SOLN: 6.2500 mg | INTRAMUSCULAR | Status: DC | PRN

## 2018-10-16 MED ORDER — BUPIVACAINE HCL 0.5 % IJ SOLN
INTRAMUSCULAR | Status: DC | PRN
  Administered 2018-10-16 (×2): 10 mL

## 2018-10-16 MED ORDER — PHENYLEPHRINE HCL 10 MG/ML IJ SOLN
INTRAMUSCULAR | Status: DC | PRN
  Administered 2018-10-16 (×3): 100 ug via INTRAVENOUS

## 2018-10-16 MED ORDER — METHYLENE BLUE 0.5 % INJ SOLN
INTRAVENOUS | Status: AC
  Filled 2018-10-16: qty 10

## 2018-10-16 MED ORDER — FENTANYL CITRATE (PF) 100 MCG/2ML IJ SOLN
INTRAMUSCULAR | Status: AC
  Filled 2018-10-16: qty 2

## 2018-10-16 MED ORDER — ONDANSETRON HCL 4 MG/2ML IJ SOLN
INTRAMUSCULAR | Status: AC
  Filled 2018-10-16: qty 2

## 2018-10-16 MED ORDER — LIDOCAINE HCL (PF) 1 % IJ SOLN
INTRAMUSCULAR | Status: AC
  Filled 2018-10-16: qty 30

## 2018-10-16 MED ORDER — MIDAZOLAM HCL 2 MG/2ML IJ SOLN
INTRAMUSCULAR | Status: AC
  Filled 2018-10-16: qty 2

## 2018-10-16 MED ORDER — SUCCINYLCHOLINE CHLORIDE 20 MG/ML IJ SOLN
INTRAMUSCULAR | Status: AC
  Filled 2018-10-16: qty 1

## 2018-10-16 MED ORDER — VANCOMYCIN HCL 1000 MG IV SOLR
INTRAVENOUS | Status: AC
  Filled 2018-10-16: qty 1000

## 2018-10-16 MED ORDER — LIDOCAINE HCL (PF) 2 % IJ SOLN
INTRAMUSCULAR | Status: AC
  Filled 2018-10-16: qty 10

## 2018-10-16 MED ORDER — LACTATED RINGERS IV SOLN
INTRAVENOUS | Status: DC | PRN
  Administered 2018-10-16: 09:00:00 via INTRAVENOUS

## 2018-10-16 MED ORDER — EPHEDRINE SULFATE 50 MG/ML IJ SOLN
INTRAMUSCULAR | Status: AC
  Filled 2018-10-16: qty 1

## 2018-10-16 MED ORDER — PROMETHAZINE HCL 25 MG/ML IJ SOLN: 6.2500 mg | INTRAMUSCULAR | Status: DC | PRN

## 2018-10-16 SURGICAL SUPPLY — 81 items
BANDAGE ACE 4X5 VEL STRL LF (GAUZE/BANDAGES/DRESSINGS) ×4 IMPLANT
BANDAGE ELASTIC 4 LF NS (GAUZE/BANDAGES/DRESSINGS) ×3 IMPLANT
BLADE OSC/SAGITTAL MD 5.5X18 (BLADE) IMPLANT
BLADE OSC/SAGITTAL MD 9X18.5 (BLADE) ×3 IMPLANT
BLADE OSCILLATING/SAGITTAL (BLADE)
BLADE SURG 15 STRL LF DISP TIS (BLADE) ×2 IMPLANT
BLADE SURG 15 STRL SS (BLADE) ×1
BLADE SW THK.38XMED LNG THN (BLADE) ×2 IMPLANT
BNDG COHESIVE 4X5 TAN STRL (GAUZE/BANDAGES/DRESSINGS) ×3 IMPLANT
BNDG COHESIVE 6X5 TAN STRL LF (GAUZE/BANDAGES/DRESSINGS) ×2 IMPLANT
BNDG CONFORM 2 STRL LF (GAUZE/BANDAGES/DRESSINGS) ×3 IMPLANT
BNDG CONFORM 3 STRL LF (GAUZE/BANDAGES/DRESSINGS) ×3 IMPLANT
BNDG ESMARK 4X12 TAN STRL LF (GAUZE/BANDAGES/DRESSINGS) ×3 IMPLANT
BNDG GAUZE 4.5X4.1 6PLY STRL (MISCELLANEOUS) ×3 IMPLANT
CANISTER SUCT 1200ML W/VALVE (MISCELLANEOUS) ×3 IMPLANT
CANISTER SUCT 3000ML PPV (MISCELLANEOUS) ×2 IMPLANT
COVER WAND RF STERILE (DRAPES) ×3 IMPLANT
CUFF TOURN 18 STER (MISCELLANEOUS) ×2 IMPLANT
CUFF TOURN DUAL PL 12 NO SLV (MISCELLANEOUS) ×2 IMPLANT
DRAIN PENROSE 1/4X12 LTX (DRAIN) ×2 IMPLANT
DRAPE FLUOR MINI C-ARM 54X84 (DRAPES) ×3 IMPLANT
DRAPE XRAY CASSETTE 23X24 (DRAPES) IMPLANT
DRESSING ALLEVYN 4X4 (MISCELLANEOUS) IMPLANT
DURAPREP 26ML APPLICATOR (WOUND CARE) ×3 IMPLANT
ELECT REM PT RETURN 9FT ADLT (ELECTROSURGICAL) ×3
ELECTRODE REM PT RTRN 9FT ADLT (ELECTROSURGICAL) ×2 IMPLANT
GAUZE PACKING 1/4 X5 YD (GAUZE/BANDAGES/DRESSINGS) ×3 IMPLANT
GAUZE PACKING IODOFORM 1X5 (MISCELLANEOUS) ×3 IMPLANT
GAUZE PETRO XEROFOAM 1X8 (MISCELLANEOUS) ×4 IMPLANT
GAUZE SPONGE 4X4 12PLY STRL (GAUZE/BANDAGES/DRESSINGS) ×3 IMPLANT
GLOVE BIO SURGEON STRL SZ7.5 (GLOVE) ×3 IMPLANT
GLOVE INDICATOR 8.0 STRL GRN (GLOVE) ×3 IMPLANT
GOWN STRL REUS W/ TWL LRG LVL3 (GOWN DISPOSABLE) ×4 IMPLANT
GOWN STRL REUS W/TWL LRG LVL3 (GOWN DISPOSABLE) ×2
GOWN STRL REUS W/TWL MED LVL3 (GOWN DISPOSABLE) ×3 IMPLANT
HANDLE YANKAUER SUCT BULB TIP (MISCELLANEOUS) ×2 IMPLANT
HANDPIECE VERSAJET DEBRIDEMENT (MISCELLANEOUS) ×1 IMPLANT
IV NS 1000ML (IV SOLUTION) ×1
IV NS 1000ML BAXH (IV SOLUTION) ×2 IMPLANT
KIT DRSG VAC SLVR GRANUFM (MISCELLANEOUS) ×2 IMPLANT
KIT TURNOVER KIT A (KITS) ×3 IMPLANT
LABEL OR SOLS (LABEL) ×3 IMPLANT
NDL FILTER BLUNT 18X1 1/2 (NEEDLE) ×2 IMPLANT
NDL HYPO 25X1 1.5 SAFETY (NEEDLE) ×2 IMPLANT
NDL SAFETY ECLIPSE 18X1.5 (NEEDLE) ×2 IMPLANT
NEEDLE FILTER BLUNT 18X 1/2SAF (NEEDLE) ×1
NEEDLE FILTER BLUNT 18X1 1/2 (NEEDLE) ×2 IMPLANT
NEEDLE HYPO 18GX1.5 SHARP (NEEDLE) ×1
NEEDLE HYPO 25X1 1.5 SAFETY (NEEDLE) ×3 IMPLANT
NS IRRIG 500ML POUR BTL (IV SOLUTION) ×3 IMPLANT
PACK EXTREMITY ARMC (MISCELLANEOUS) ×3 IMPLANT
PAD ABD DERMACEA PRESS 5X9 (GAUZE/BANDAGES/DRESSINGS) ×6 IMPLANT
PENCIL ELECTRO HAND CTR (MISCELLANEOUS) ×3 IMPLANT
PULSAVAC PLUS IRRIG FAN TIP (DISPOSABLE) ×3
RASP SM TEAR CROSS CUT (RASP) IMPLANT
SHIELD FULL FACE ANTIFOG 7M (MISCELLANEOUS) ×3 IMPLANT
SOL .9 NS 3000ML IRR  AL (IV SOLUTION) ×1
SOL .9 NS 3000ML IRR UROMATIC (IV SOLUTION) ×2 IMPLANT
SOL PREP PVP 2OZ (MISCELLANEOUS)
SOLUTION PREP PVP 2OZ (MISCELLANEOUS) ×2 IMPLANT
SPONGE LAP 18X18 RF (DISPOSABLE) ×3 IMPLANT
STOCKINETTE IMPERVIOUS 9X36 MD (GAUZE/BANDAGES/DRESSINGS) ×3 IMPLANT
STOCKINETTE M/LG 89821 (MISCELLANEOUS) ×3 IMPLANT
SUT ETHILON 2 0 FS 18 (SUTURE) ×6 IMPLANT
SUT ETHILON 2 0 FSLX (SUTURE) ×2 IMPLANT
SUT ETHILON 3-0 FS-10 30 BLK (SUTURE) ×6
SUT ETHILON 4-0 (SUTURE)
SUT ETHILON 4-0 FS2 18XMFL BLK (SUTURE)
SUT VIC AB 2-0 CT1 27 (SUTURE)
SUT VIC AB 2-0 CT1 TAPERPNT 27 (SUTURE) ×2 IMPLANT
SUT VIC AB 3-0 SH 27 (SUTURE)
SUT VIC AB 3-0 SH 27X BRD (SUTURE) ×2 IMPLANT
SUT VIC AB 4-0 FS2 27 (SUTURE) ×2 IMPLANT
SUTURE EHLN 3-0 FS-10 30 BLK (SUTURE) ×4 IMPLANT
SUTURE ETHLN 4-0 FS2 18XMF BLK (SUTURE) ×2 IMPLANT
SWAB CULTURE AMIES ANAERIB BLU (MISCELLANEOUS) ×1 IMPLANT
SYR 10ML LL (SYRINGE) ×3 IMPLANT
SYR 3ML LL SCALE MARK (SYRINGE) ×3 IMPLANT
TIP FAN IRRIG PULSAVAC PLUS (DISPOSABLE) ×2 IMPLANT
TRAY PREP VAG/GEN (MISCELLANEOUS) ×2 IMPLANT
WND VAC CANISTER 500ML (MISCELLANEOUS) ×3 IMPLANT

## 2018-10-16 NOTE — Progress Notes (Signed)
Pharmacy Antibiotic Note  Austin [name] is a 63 y.o. male admitted on 10/14/2018 with Wound Infection.  Pharmacy has been consulted for vancomycin and pip/tazo dosing.  Plan: Zosyn 3.375g IV q8h (4 hour infusion).  Vancomycin 1500 mg q12h maintenance regimen. Predicted AUC is 437. Goal AUC is 400-550. Will obtain level at the 4th or 5th dose. Scr used 0.97 for calculation.  1/10 - Vancomycin 1500 mg IV Q 12 hrs. Goal AUC 400-550. Expected AUC: 548 SCr used: 0.97 Will have SCr ordered for 1/11 to verify renal function.  If stable will plan for follow up on expected length of therapy.  If worsened will need to obtain peak and trough levels.     Temp (24hrs), Avg:97.7 F (36.5 C), Min:97.3 F (36.3 C), Max:98.2 F (36.8 C)  Recent Labs  Lab 10/14/18 1750 10/15/18 0441  WBC 9.8 9.0  CREATININE 0.79 0.97    Estimated Creatinine Clearance: 95.7 mL/min (by C-G formula based on SCr of 0.97 mg/dL).    No Known Allergies  Antimicrobials this admission: 1/8 pip/tazo >>  1/8 vanocmycin >>   Dose adjustments this admission: none  Microbiology results: None  Thank you for allowing pharmacy to be a part of this patient's care.  Orinda Kenner, PharmD Clinical Pharmacist 10/16/2018 2:03 PM

## 2018-10-16 NOTE — Anesthesia Post-op Follow-up Note (Signed)
Anesthesia QCDR form completed.        

## 2018-10-16 NOTE — Anesthesia Procedure Notes (Signed)
Procedure Name: LMA Insertion Date/Time: 10/16/2018 8:43 AM Performed by: Karoline Caldwell, CRNA Pre-anesthesia Checklist: Patient identified, Patient being monitored, Timeout performed, Emergency Drugs available and Suction available Patient Re-evaluated:Patient Re-evaluated prior to induction Oxygen Delivery Method: Circle system utilized Preoxygenation: Pre-oxygenation with 100% oxygen Induction Type: IV induction Ventilation: Mask ventilation without difficulty LMA: LMA inserted LMA Size: 4.5 Tube type: Oral Number of attempts: 1 Placement Confirmation: positive ETCO2 and breath sounds checked- equal and bilateral Tube secured with: Tape Dental Injury: Teeth and Oropharynx as per pre-operative assessment

## 2018-10-16 NOTE — Anesthesia Postprocedure Evaluation (Signed)
Anesthesia Post Note  Patient: Austin [name]  Procedure(s) Performed: TRANSMETATARSAL AMPUTATION LEFT FOOT (Left Foot) APPLICATION OF WOUND VAC (Left )  Patient location during evaluation: PACU Anesthesia Type: General Level of consciousness: awake and alert and oriented Pain management: pain level controlled Vital Signs Assessment: post-procedure vital signs reviewed and stable Respiratory status: spontaneous breathing, nonlabored ventilation and respiratory function stable Cardiovascular status: blood pressure returned to baseline and stable Postop Assessment: no signs of nausea or vomiting Anesthetic complications: no     Last Vitals:  Vitals:   10/16/18 1034 10/16/18 1100  BP: (!) 142/86 130/83  Pulse: 82 100  Resp: (!) 9 18  Temp: 36.6 C (!) 36.4 C  SpO2: 100% 100%    Last Pain:  Vitals:   10/16/18 1100  TempSrc: Oral  PainSc:                  Fronie Holstein

## 2018-10-16 NOTE — Care Management Important Message (Signed)
Important Message  Patient Details  Name: Austin [name] MRN: [id number] Date of Birth: [birth year]   Medicare Important Message Given:  Yes    Olegario Messier A Nolberto Cheuvront 10/16/2018, 11:41 AM

## 2018-10-16 NOTE — Op Note (Signed)
Operative note   Surgeon:Elver Stadler Armed forces logistics/support/administrative officer: None    Preop diagnosis: Gangrene left forefoot    Postop diagnosis: Same    Procedure: Transmetatarsal amputation left forefoot    EBL: 30 mL's    Anesthesia:local and general local consisted of a total of 20 mL's of 0.5% bupivacaine and 10 mL's of 1% lidocaine    Hemostasis: None    Specimen: Deep wound culture and gangrenous forefoot    Complications: None    Operative indications:Austin [name] is an 63 y.o. that presents today for surgical intervention.  The risks/benefits/alternatives/complications have been discussed and consent has been given.    Procedure:  Patient was brought into the OR and placed on the operating table in thesupine position. After anesthesia was obtained theleft lower extremity was prepped and draped in usual sterile fashion.  Attention was directed to the distal aspect of the left foot where previous second toe amputation was noted completely dehisced with full-thickness necrosis down to the metatarsal head.  The second toe was also necrotic as well.  Excision of the third toe revealed a large defect with residual necrotic tissue proximal.  At this point the decision was made to perform a transmetatarsal amputation.  Dorsal and plantar skin flaps were created full-thickness in nature.  This was taken back proximal to approximately the level of the midshaft of the metatarsals.  Osteotomies were created across all metatarsals and the bones and toes were then removed from the surgical field in toto.  The visual wound from the previous second toe amputation site was excisionally debrided down to muscle and tendon with a versa jet.  This was taken down to good healthy normal bleeding tissue.  Mild to moderate bleeding was noted intraoperatively.  After Bovie cauterization of several small bleeders and further flushing of the wound with copious amounts of irrigation.  The wound was then closed with a 2-0 nylon.   Small part of the distal flap was left open for packing.  This was packed with iodoform impregnated packing strips.  A large bulky sterile dressing was applied after placement of 0.5% bupivacaine around all areas.    Patient tolerated the procedure and anesthesia well.  Was transported from the OR to the PACU with all vital signs stable and vascular status intact. To be discharged per routine protocol.

## 2018-10-16 NOTE — Transfer of Care (Signed)
Immediate Anesthesia Transfer of Care Note  Patient: Austin [name]  Procedure(s) Performed: TRANSMETATARSAL AMPUTATION LEFT FOOT (Left Foot) APPLICATION OF WOUND VAC (Left )  Patient Location: PACU  Anesthesia Type:General  Level of Consciousness: sedated  Airway & Oxygen Therapy: Patient Spontanous Breathing and Patient connected to nasal cannula oxygen  Post-op Assessment: Report given to RN and Post -op Vital signs reviewed and stable  Post vital signs: Reviewed and stable  Last Vitals:  Vitals Value Taken Time  BP 137/81 10/16/2018  9:52 AM  Temp 36.4 C 10/16/2018  9:52 AM  Pulse 92 10/16/2018  9:52 AM  Resp 13 10/16/2018  9:52 AM  SpO2 100 % 10/16/2018  9:52 AM  Vitals shown include unvalidated device data.  Last Pain:  Vitals:   10/16/18 0952  TempSrc:   PainSc: 0-No pain      Patients Stated Pain Goal: 2 (10/15/18 1735)  Complications: No apparent anesthesia complications

## 2018-10-16 NOTE — Anesthesia Preprocedure Evaluation (Signed)
Anesthesia Evaluation  Patient identified by MRN, date of birth, ID band Patient awake    Reviewed: Allergy & Precautions, NPO status , Patient's Chart, lab work & pertinent test results  History of Anesthesia Complications Negative for: history of anesthetic complications  Airway Mallampati: II  TM Distance: >3 FB Neck ROM: Full    Dental  (+) Missing, Poor Dentition   Pulmonary neg sleep apnea, neg COPD, Current Smoker,    breath sounds clear to auscultation- rhonchi (-) wheezing      Cardiovascular hypertension, (-) CAD, (-) Past MI, (-) Cardiac Stents and (-) CABG  Rhythm:Regular Rate:Normal - Systolic murmurs and - Diastolic murmurs    Neuro/Psych neg Seizures negative neurological ROS  negative psych ROS   GI/Hepatic negative GI ROS, Neg liver ROS,   Endo/Other  diabetes, Insulin Dependent  Renal/GU negative Renal ROS     Musculoskeletal negative musculoskeletal ROS (+)   Abdominal (+) - obese,   Peds  Hematology negative hematology ROS (+)   Anesthesia Other Findings Past Medical History: No date: Diabetes (HCC) No date: DVT (deep venous thrombosis) (HCC)   Reproductive/Obstetrics                             Anesthesia Physical Anesthesia Plan  ASA: III  Anesthesia Plan: General   Post-op Pain Management:    Induction: Intravenous  PONV Risk Score and Plan: 0 and Ondansetron  Airway Management Planned: LMA  Additional Equipment:   Intra-op Plan:   Post-operative Plan:   Informed Consent: I have reviewed the patients History and Physical, chart, labs and discussed the procedure including the risks, benefits and alternatives for the proposed anesthesia with the patient or authorized representative who has indicated his/her understanding and acceptance.   Dental advisory given  Plan Discussed with: CRNA and Anesthesiologist  Anesthesia Plan Comments:          Anesthesia Quick Evaluation

## 2018-10-16 NOTE — Progress Notes (Signed)
Patient taking to OR by transporter via bed. Report given to Sundance, RN

## 2018-10-16 NOTE — Progress Notes (Signed)
Per [facility] admissions coordinator at [name] she started [facility] authorization today.   [employer], LCSW [phone]

## 2018-10-17 LAB — CREATININE, SERUM
Creatinine, Ser: 0.79 mg/dL (ref 0.61–1.24)
GFR calc Af Amer: >60 mL/min (ref >60)
GFR calc non Af Amer: >60 mL/min (ref >60)

## 2018-10-17 LAB — GLUCOSE, CAPILLARY
Glucose-Capillary: 165 mg/dL — ABNORMAL HIGH (ref 70–99)
Glucose-Capillary: 169 mg/dL — ABNORMAL HIGH (ref 70–99)
Glucose-Capillary: 179 mg/dL — ABNORMAL HIGH (ref 70–99)
Glucose-Capillary: 232 mg/dL — ABNORMAL HIGH (ref 70–99)

## 2018-10-17 MED ORDER — CLOPIDOGREL BISULFATE 75 MG PO TABS
75.0000 mg | ORAL_TABLET | Freq: Every day | ORAL | Status: DC
  Administered 2018-10-17 – 2018-10-19 (×3): 75 mg via ORAL
  Filled 2018-10-17 (×3): qty 1

## 2018-10-17 NOTE — Clinical Social Work Note (Signed)
CSW received consult for SNF placement. Per CSW notes:  Per [employer] admissions coordinator at [name] she started [facility] SN[facility] authorization today.   [facility] authorization is still pending. The patient cannot discharge until the authorization is received. CSW is following.  Argentina Ponder, MSW, Theresia Majors [phone]

## 2018-10-17 NOTE — Progress Notes (Signed)
[employer] [facility] [place] [facility]   PATIENT NAME: Austin [name]    MR#:  [id number]  DATE OF BIRTH:  01/20/1956  SUBJECTIVE:  CHIEF COMPLAINT:  No chief complaint on file. Came with worsening wound on foot.  Status post surgery no complaints.  REVIEW OF SYSTEMS:  CONSTITUTIONAL: No fever, fatigue or weakness.  EYES: No blurred or double vision.  EARS, NOSE, AND THROAT: No tinnitus or ear pain.  RESPIRATORY: No cough, shortness of breath, wheezing or hemoptysis.  CARDIOVASCULAR: No chest pain, orthopnea, edema.  GASTROINTESTINAL: No nausea, vomiting, diarrhea or abdominal pain.  GENITOURINARY: No dysuria, hematuria.  ENDOCRINE: No polyuria, nocturia,  HEMATOLOGY: No anemia, easy bruising or bleeding SKIN: No rash or lesion. MUSCULOSKELETAL: No joint pain or arthritis.   NEUROLOGIC: No tingling, numbness, weakness.  PSYCHIATRY: No anxiety or depression.   ROS  DRUG ALLERGIES:  No Known Allergies  VITALS:  Blood pressure 113/77, pulse 94, temperature 97.8 F (36.6 C), resp. rate 18, SpO2 100 %.  PHYSICAL EXAMINATION:  GENERAL:  63 y.o.-year-old patient lying in the bed with no acute distress.  EYES: Pupils equal, round, reactive to light and accommodation. No scleral icterus. Extraocular muscles intact.  HEENT: Head atraumatic, normocephalic. Oropharynx and nasopharynx clear.  NECK:  Supple, no jugular venous distention. No thyroid enlargement, no tenderness.  LUNGS: Normal breath sounds bilaterally, no wheezing, rales,rhonchi or crepitation. No use of accessory muscles of respiration.  CARDIOVASCULAR: S1, S2 normal. No murmurs, rubs, or gallops.  ABDOMEN: Soft, nontender, nondistended. Bowel sounds present. No organomegaly or mass.  EXTREMITIES: No pedal edema, cyanosis, or clubbing.left foot-status post transmetatarsal amputation and dressing present  NEUROLOGIC: Cranial nerves II through XII are intact. Muscle strength 4/5 in all extremities. Sensation  intact. Gait not checked.  PSYCHIATRIC: The patient is alert and oriented x 3.  SKIN: No obvious rash, lesion, or ulcer.   Physical Exam LABORATORY PANEL:   CBC Recent Labs  Lab 10/15/18 0441  WBC 9.0  HGB 12.4*  HCT 37.6*  PLT 268   ------------------------------------------------------------------------------------------------------------------  Chemistries  Recent Labs  Lab 10/15/18 0441 10/17/18 0509  NA 136  --   K 4.1  --   CL 102  --   CO2 28  --   GLUCOSE 222*  --   BUN 14  --   CREATININE 0.97 0.79  CALCIUM 8.9  --    ------------------------------------------------------------------------------------------------------------------  Cardiac Enzymes No results for input(s): TROPONINI in the last 168 hours. ------------------------------------------------------------------------------------------------------------------  RADIOLOGY:  No results found.  ASSESSMENT AND PLAN:   Active Problems:   Diabetic foot infection (HCC)  * Left diabetic foot wound- s/p debridement and first and second ray amputation on 12/25.  S/p left lower extremity angiogram 12/26. 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 - Start empiric IV antibiotics - Pain control -Podiatry suggested to start nonweightbearing exercise and likely requirement of 3 to 4 weeks of IV antibiotic and advised to get ID consult.  * Uncontrolled type 2 diabetes- recent A1c 8.1%. - Lantus 15 units nightly and SSI  * History of DVT- unclear when this occurred -Hold Eliquis for surgery -Subcutaneous heparin for DVT prophylaxis  -We will resume Eliquis once podiatry has no other plans.  * Peripheral vascular disease- s/p angiogram 12/26 by Dr. Wyn Quakerew. -Holding Plavix for surgery-resume now.  * Hyperlipidemia-stable -Continue home Lipitor   All the records are reviewed and case discussed with Care Management/Social Workerr. Management plans discussed with the patient, family and  they are  in agreement.  CODE STATUS: Full.  TOTAL TIME TAKING CARE OF THIS PATIENT: 35 minutes.    POSSIBLE D/C IN 1-2 DAYS, DEPENDING ON CLINICAL CONDITION.   Altamese DillingVaibhavkumar Santos Hardwick M.D on 10/17/2018   Between 7am to 6pm - Pager - [phone]  After 6pm go to [email or web address] - password [email or web address]  [facility]  Office  [phone]  CC: Primary care physician; [name], [name], MD  Note: This dictation was prepared with Dragon dictation along with smaller phrase technology. Any transcriptional errors that result from this process are unintentional.

## 2018-10-17 NOTE — Progress Notes (Signed)
Daily Progress Note   Subjective  - 1 Day Post-Op  F/u TMA left foot  Objective Vitals:   10/16/18 1634 10/16/18 1652 10/16/18 2339 10/17/18 0825  BP: 112/60  128/76 113/77  Pulse: (!) 124 73 96 94  Resp: 18  19 18   Temp: 98.1 F (36.7 C)  98.1 F (36.7 C) 97.8 F (36.6 C)  TempSrc:      SpO2: 97% 100% 100% 100%    Physical Exam: Wound stable.  Mild bloody drainage.  Laboratory CBC    Component Value Date/Time   WBC 9.0 10/15/2018 0441   HGB 12.4 (L) 10/15/2018 0441   HGB 14.1 11/17/2013 0621   HCT 37.6 (L) 10/15/2018 0441   HCT 40.1 11/17/2013 0621   PLT 268 10/15/2018 0441   PLT 218 11/17/2013 0621    BMET    Component Value Date/Time   NA 136 10/15/2018 0441   NA 135 (L) 11/17/2013 0621   K 4.1 10/15/2018 0441   K 4.3 11/17/2013 0621   CL 102 10/15/2018 0441   CL 99 11/17/2013 0621   CO2 28 10/15/2018 0441   CO2 31 11/17/2013 0621   GLUCOSE 222 (H) 10/15/2018 0441   GLUCOSE 172 (H) 11/17/2013 0621   BUN 14 10/15/2018 0441   BUN 10 11/17/2013 0621   CREATININE 0.79 10/17/2018 0509   CREATININE 0.85 11/17/2013 0621   CALCIUM 8.9 10/15/2018 0441   CALCIUM 9.6 11/17/2013 0621   GFRNONAA >60 10/17/2018 0509   GFRNONAA >60 11/17/2013 0621   GFRAA >60 10/17/2018 0509   GFRAA >60 11/17/2013 7948    Assessment/Planning: Gangrene with infection left foot   Dressing changed today  Will c/w IV abx.  Will recommend 2-4 weeks IV abx upon d/c.  Consult ID for IV recommendations and outpt f/u.  Will have PT begin to evaluate for NWB left foot and attempted ROM of left lower leg.  Pt with contracture.  F/u tomorrow.  Gwyneth Revels A  10/17/2018, 10:51 AM

## 2018-10-17 NOTE — Progress Notes (Signed)
[facility] [place] [facility] [address] [facility]   PATIENT NAME: Austin [name]    MR#:  [id number]  DATE OF BIRTH:  03/19/1956  SUBJECTIVE:  CHIEF COMPLAINT:  No chief complaint on file. Came with worsening wound on foot.  Status post surgery no complaints.  REVIEW OF SYSTEMS:  CONSTITUTIONAL: No fever, fatigue or weakness.  EYES: No blurred or double vision.  EARS, NOSE, AND THROAT: No tinnitus or ear pain.  RESPIRATORY: No cough, shortness of breath, wheezing or hemoptysis.  CARDIOVASCULAR: No chest pain, orthopnea, edema.  GASTROINTESTINAL: No nausea, vomiting, diarrhea or abdominal pain.  GENITOURINARY: No dysuria, hematuria.  ENDOCRINE: No polyuria, nocturia,  HEMATOLOGY: No anemia, easy bruising or bleeding SKIN: No rash or lesion. MUSCULOSKELETAL: No joint pain or arthritis.   NEUROLOGIC: No tingling, numbness, weakness.  PSYCHIATRY: No anxiety or depression.   ROS  DRUG ALLERGIES:  No Known Allergies  VITALS:  Blood pressure 113/77, pulse 94, temperature 97.8 F (36.6 C), resp. rate 18, SpO2 100 %.  PHYSICAL EXAMINATION:  GENERAL:  63 y.o.-year-old patient lying in the bed with no acute distress.  EYES: Pupils equal, round, reactive to light and accommodation. No scleral icterus. Extraocular muscles intact.  HEENT: Head atraumatic, normocephalic. Oropharynx and nasopharynx clear.  NECK:  Supple, no jugular venous distention. No thyroid enlargement, no tenderness.  LUNGS: Normal breath sounds bilaterally, no wheezing, rales,rhonchi or crepitation. No use of accessory muscles of respiration.  CARDIOVASCULAR: S1, S2 normal. No murmurs, rubs, or gallops.  ABDOMEN: Soft, nontender, nondistended. Bowel sounds present. No organomegaly or mass.  EXTREMITIES: No pedal edema, cyanosis, or clubbing.left foot-status post transmetatarsal amputation and dressing present  NEUROLOGIC: Cranial nerves II through XII are intact. Muscle strength 4/5 in all extremities. Sensation  intact. Gait not checked.  PSYCHIATRIC: The patient is alert and oriented x 3.  SKIN: No obvious rash, lesion, or ulcer.   Physical Exam LABORATORY PANEL:   CBC Recent Labs  Lab 10/15/18 0441  WBC 9.0  HGB 12.4*  HCT 37.6*  PLT 268   ------------------------------------------------------------------------------------------------------------------  Chemistries  Recent Labs  Lab 10/15/18 0441 10/17/18 0509  NA 136  --   K 4.1  --   CL 102  --   CO2 28  --   GLUCOSE 222*  --   BUN 14  --   CREATININE 0.97 0.79  CALCIUM 8.9  --    ------------------------------------------------------------------------------------------------------------------  Cardiac Enzymes No results for input(s): TROPONINI in the last 168 hours. ------------------------------------------------------------------------------------------------------------------  RADIOLOGY:  No results found.  ASSESSMENT AND PLAN:   Active Problems:   Diabetic foot infection (HCC)  * Left diabetic foot wound- s/p debridement and first and second ray amputation on 12/25.  S/p left lower extremity angiogram 12/26. 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 - Start empiric IV antibiotics - Pain control  * Uncontrolled type 2 diabetes- recent A1c 8.1%. - Lantus 15 units nightly and SSI  * History of DVT- unclear when this occurred -Hold Eliquis for surgery -Subcutaneous heparin for DVT prophylaxis  -We will resume Eliquis once podiatry has no other plans.  * Peripheral vascular disease- s/p angiogram 12/26 by Dr. Wyn Quakerew. -Holding Plavix for surgery  * Hyperlipidemia-stable -Continue home Lipitor   All the records are reviewed and case discussed with Care Management/Social Workerr. Management plans discussed with the patient, family and they are in agreement.  CODE STATUS: Full.  TOTAL TIME TAKING CARE OF THIS PATIENT: 35 minutes.    POSSIBLE D/C IN *1-2 DAYS, DEPENDING  ON CLINICAL  CONDITION.   Altamese Dilling M.D on 10/17/2018   Between 7am to 6pm - Pager - [phone]  After 6pm go to [email or web address] - password [email or web address]  [facility]  Office  [phone]  CC: Primary care physician; Preston Fleeting, MD  Note: This dictation was prepared with Dragon dictation along with smaller phrase technology. Any transcriptional errors that result from this process are unintentional.

## 2018-10-17 NOTE — Progress Notes (Signed)
Physical Therapy Evaluation Patient Details Name: Austin [name] MRN: [id number] DOB: Jul 21, 1956 Today's Date: 10/17/2018   History of Present Illness  Austin [name]  is a 63 y.o. male with a known history of type 2 diabetes and history DVT who was directly admitted from the podiatry office for persistent left lower extremity diabetic foot infection.  He underwent debridement and first and second toe amputations on 09/30/2018.  He has been on antibiotics as an outpatient, but his wound has continued to worsen.  Underwent left lower extremity angiogram on 10/01/2018 with angioplasty and stent placement.  He denies any fevers or chills.  He endorses some mild left foot pain.  No lower extremity edema. He underwent L transmetatarsal amputation and is POD#1 at time of PT evaluation. No reported post-op complications.   Clinical Impression  Pt admitted with above diagnosis. Pt currently with functional limitations due to the deficits listed below (see PT Problem List).  Pt is modified independent for bed mobility however he moves slowly and reports pain in LLE. He requires modA and two attempts to come to standing with BUE support on walker. Cues provided for safe hand placement and bed elevated. Pt is able to take one small hop forward and backwards with maximal effort but demonstrates severe instability. He is able to hop a few times to his left up toward the [facility]. Unsafe/unable to ambulate at this time. He will need SNF placement at discharge in order facilitate safe return home. Pt will benefit from PT services to address deficits in strength, balance, and mobility in order to return to full function at home.     Follow Up Recommendations SNF    Equipment Recommendations  None recommended by PT;Other (comment)(Must use walker at discharge)    Recommendations for Other Services       Precautions / Restrictions Precautions Precautions: Fall Restrictions Weight Bearing Restrictions: Yes LLE  Weight Bearing: Non weight bearing      Mobility  Bed Mobility Overal bed mobility: Modified Independent             General bed mobility comments: Able to get to EOB with HOB elevated and use of bed rails. Requires additional time  Transfers Overall transfer level: Needs assistance Equipment used: Rolling walker (2 wheeled) Transfers: Sit to/from Stand Sit to Stand: Mod assist         General transfer comment: Pt requires modA and two attempts to come to standing with BUE support on walker. Cues provided for safe hand placement and bed elevated. Pt is able to take one small hop forward and backwards with maximal effort but demonstrates severe instability. He is able to hop a few times to his left up toward the [facility]. Unsafe/unable to ambulate at this time.   Ambulation/Gait                Stairs            Wheelchair Mobility    Modified Rankin (Stroke Patients Only)       Balance Overall balance assessment: Needs assistance Sitting-balance support: No upper extremity supported Sitting balance-Leahy Scale: Fair     Standing balance support: Bilateral upper extremity supported Standing balance-Leahy Scale: Poor                               Pertinent Vitals/Pain Pain Assessment: No/denies pain Pain Location: Pt denies pain at rest but with mobility reports increase in L  foot pain, does not rate    Home Living Family/patient expects to be discharged to:: Private residence Living Arrangements: Non-relatives/Friends Available Help at Discharge: Friend(s);Available PRN/intermittently Type of Home: Mobile home Home Access: Stairs to enter Entrance Stairs-Rails: Can reach both Entrance Stairs-Number of Steps: 3 Home Layout: One level Home Equipment: Walker - 2 wheels;Wheelchair - manual;Shower seat      Prior Function Level of Independence: Independent with assistive device(s)         Comments: 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     Hand Dominance   Dominant Hand: Right    Extremity/Trunk Assessment   Upper Extremity Assessment Upper Extremity Assessment: Overall WFL for tasks assessed    Lower Extremity Assessment Lower Extremity Assessment: LLE deficits/detail LLE Deficits / Details: Pt is able to perform full L SLR without assistance. He demonstrates some weakness in RLE with sit to stand transfers due to demand required secondary to NWB on RLE. Pt appears to have a L knee flexion contracture       Communication   Communication: No difficulties  Cognition Arousal/Alertness: Awake/alert Behavior During Therapy: WFL for tasks assessed/performed Overall Cognitive Status: Within Functional Limits for tasks assessed                                 General Comments: Follows instructions consistently      General Comments      Exercises     Assessment/Plan    PT Assessment Patient needs continued PT services  PT Problem List Decreased strength;Decreased mobility;Decreased activity tolerance;Decreased balance;Decreased knowledge of use of DME;Pain       PT Treatment Interventions DME instruction;Functional mobility training;Balance training;Patient/family education;Gait training;Therapeutic activities;Stair training;Therapeutic exercise    PT Goals (Current goals can be found in the Care Plan section)  Acute Rehab PT Goals Patient Stated Goal: To return home and to regular daily activity as soon as possible. PT Goal Formulation: With patient Time For Goal Achievement: 10/31/18 Potential to Achieve Goals: Good    Frequency 7X/week   Barriers to discharge        Co-evaluation               AM-PAC PT "6 Clicks" Mobility  Outcome Measure Help needed turning from your back to your side while in a flat bed without using bedrails?: None Help needed moving from lying on your back to sitting on the side of a flat bed without using bedrails?:  None Help needed moving to and from a bed to a chair (including a wheelchair)?: A Lot Help needed standing up from a chair using your arms (e.g., wheelchair or bedside chair)?: A Lot Help needed to walk in hospital room?: Total Help needed climbing 3-5 steps with a railing? : Total 6 Click Score: 14    End of Session Equipment Utilized During Treatment: Gait belt Activity Tolerance: Patient tolerated treatment well Patient left: in bed;with call bell/phone within reach;with bed alarm set Nurse Communication: Mobility status PT Visit Diagnosis: Unsteadiness on feet (R26.81);Other abnormalities of gait and mobility (R26.89);Pain;Muscle weakness (generalized) (M62.81) Pain - Right/Left: Left Pain - part of body: Ankle and joints of foot    Time: 1331-1346 PT Time Calculation (min) (ACUTE ONLY): 15 min   Charges:   PT Evaluation $PT Eval Low Complexity: 1 Low          Renaud Celli D Cristina Mattern PT, DPT, GCS   Vina Byrd  10/17/2018, 2:15 PM

## 2018-10-18 DIAGNOSIS — Z872 Personal history of diseases of the skin and subcutaneous tissue: Secondary | ICD-10-CM

## 2018-10-18 DIAGNOSIS — E1152 Type 2 diabetes mellitus with diabetic peripheral angiopathy with gangrene: Principal | ICD-10-CM

## 2018-10-18 DIAGNOSIS — I96 Gangrene, not elsewhere classified: Secondary | ICD-10-CM

## 2018-10-18 DIAGNOSIS — E785 Hyperlipidemia, unspecified: Secondary | ICD-10-CM

## 2018-10-18 DIAGNOSIS — Z86718 Personal history of other venous thrombosis and embolism: Secondary | ICD-10-CM

## 2018-10-18 DIAGNOSIS — Z7902 Long term (current) use of antithrombotics/antiplatelets: Secondary | ICD-10-CM

## 2018-10-18 DIAGNOSIS — F17210 Nicotine dependence, cigarettes, uncomplicated: Secondary | ICD-10-CM

## 2018-10-18 DIAGNOSIS — B965 Pseudomonas (aeruginosa) (mallei) (pseudomallei) as the cause of diseases classified elsewhere: Secondary | ICD-10-CM

## 2018-10-18 DIAGNOSIS — E11628 Type 2 diabetes mellitus with other skin complications: Secondary | ICD-10-CM

## 2018-10-18 DIAGNOSIS — Z79899 Other long term (current) drug therapy: Secondary | ICD-10-CM

## 2018-10-18 DIAGNOSIS — B964 Proteus (mirabilis) (morganii) as the cause of diseases classified elsewhere: Secondary | ICD-10-CM

## 2018-10-18 DIAGNOSIS — Z95820 Peripheral vascular angioplasty status with implants and grafts: Secondary | ICD-10-CM

## 2018-10-18 DIAGNOSIS — L089 Local infection of the skin and subcutaneous tissue, unspecified: Secondary | ICD-10-CM

## 2018-10-18 DIAGNOSIS — I10 Essential (primary) hypertension: Secondary | ICD-10-CM

## 2018-10-18 DIAGNOSIS — Z89422 Acquired absence of other left toe(s): Secondary | ICD-10-CM

## 2018-10-18 DIAGNOSIS — Z89412 Acquired absence of left great toe: Secondary | ICD-10-CM

## 2018-10-18 LAB — CBC
HCT: 38.5 % — ABNORMAL LOW (ref 39.0–52.0)
Hemoglobin: 13 g/dL (ref 13.0–17.0)
MCH: 28.9 pg (ref 26.0–34.0)
MCHC: 33.8 g/dL (ref 30.0–36.0)
MCV: 85.6 fL (ref 80.0–100.0)
Platelets: 284 10*3/uL (ref 150–400)
RBC: 4.5 MIL/uL (ref 4.22–5.81)
RDW: 12.1 % (ref 11.5–15.5)
WBC: 10.7 10*3/uL — AB (ref 4.0–10.5)
nRBC: 0 % (ref 0.0–0.2)

## 2018-10-18 LAB — BASIC METABOLIC PANEL
ANION GAP: 6 (ref 5–15)
BUN: 11 mg/dL (ref 8–23)
CALCIUM: 9.2 mg/dL (ref 8.9–10.3)
CO2: 28 mmol/L (ref 22–32)
Chloride: 101 mmol/L (ref 98–111)
Creatinine, Ser: 0.73 mg/dL (ref 0.61–1.24)
GFR calc non Af Amer: >60 mL/min (ref >60)
Glucose, Bld: 193 mg/dL — ABNORMAL HIGH (ref 70–99)
Potassium: 4 mmol/L (ref 3.5–5.1)
Sodium: 135 mmol/L (ref 135–145)

## 2018-10-18 LAB — GLUCOSE, CAPILLARY
GLUCOSE-CAPILLARY: 208 mg/dL — AB (ref 70–99)
Glucose-Capillary: 150 mg/dL — ABNORMAL HIGH (ref 70–99)
Glucose-Capillary: 183 mg/dL — ABNORMAL HIGH (ref 70–99)
Glucose-Capillary: 240 mg/dL — ABNORMAL HIGH (ref 70–99)

## 2018-10-18 NOTE — Progress Notes (Signed)
Blood glucose = 240

## 2018-10-18 NOTE — Consult Note (Signed)
NAME: Austin [name]  DOB: December 25, 1955  MRN: [id number]  Date/Time: 10/18/2018 11:14 AM Dr. Ether Griffins Subjective:  REASON FOR CONSULT: Left foot infection ?Patient is a vague historian.  Chart reviewed Austin [name] is a 63 y.o. male with a history of DM , peripheral vascular disease, chronic left foot infection was admitted with worsening necrotic wound of the left foot on 10/14/18.  Patient has a complicated left foot infection history. Has has had left foot wound with infections for the past 5 years.  He has had peripheral revascularization and has also had IV antibiotics in the past for osteomyelitis.  Been followed by podiatry on a regular basis.In May 2019 he underwent left second toe amputation for gangrene.During that hospitalization he also had angioplasty of the left peroneal artery and tibioperoneal trunk.  Also had angioplasty of the left  SFA and popliteal arteries.  In December 2019 he was admitted again for .  full-thickness ulceration and infection of the left foot.  He was seen by Dr. Alberteen Spindle and had debridement of the wound and bone on 09/30/2018 .  Had second metatarsal head resection.    The culture was mixed aerobes and anaerobes..He was seen by vascular and underwent percutaneous transluminal angioplasty of the left anterior tibial artery, entire left SFA and popliteal artery, Left tibioperoneal trunk and stent placement to the left SFA and popliteal arteries for residual stenosis after angioplasty.  While in the hospital he was on IV antibiotics broad-spectrum with Vanco and cefepime and Flagyl and he was discharged on 10/04/2018 on p.o. antibiotics which was Doxy and Augmentin.Marland Kitchen He returned to the ED on 10/11/2018 with dehiscence of the wound amputation site. HE was admitted on 10/14/18.  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 Ether Griffins.  Deep wound cultures were sent. I am asked to see the  patient and advised antibiotics. The culture is positive for pseudomonas aeruginosa and Proteus vulgaris.  He was initially on Vanco and Zosyn.  And Vanco has been discontinued. Patient says he is feeling better.  No specific complaints  Past medical history PAD Diabetes Essential hypertension Hyperlipidemia DVT Past Surgical History:  Procedure Laterality Date  . AMPUTATION TOE Left 02/10/2018   Procedure: AMPUTATION TOE;  Surgeon: Linus Galas, DPM;  Location: [facility];  Service: Podiatry;  Laterality: Left;  . APPLICATION OF WOUND VAC Left 10/16/2018   Procedure: APPLICATION OF WOUND VAC;  Surgeon: Gwyneth Revels, DPM;  Location: [facility];  Service: Podiatry;  Laterality: Left;  . groin surgery    . IRRIGATION AND DEBRIDEMENT FOOT Left 09/30/2018   Procedure: IRRIGATION AND DEBRIDEMENT FOOT;  Surgeon: Linus Galas, DPM;  Location: [facility];  Service: Podiatry;  Laterality: Left;  . LOWER EXTREMITY ANGIOGRAPHY Left 02/12/2018   Procedure: Lower Extremity Angiography;  Surgeon: Annice Needy, MD;  Location: [facility];  Service: Cardiovascular;  Laterality: Left;  . LOWER EXTREMITY ANGIOGRAPHY Left 10/01/2018   Procedure: Lower Extremity Angiography;  Surgeon: Annice Needy, MD;  Location: [facility];  Service: Cardiovascular;  Laterality: Left;  . LOWER EXTREMITY INTERVENTION  02/12/2018   Procedure: LOWER EXTREMITY INTERVENTION;  Surgeon: Annice Needy, MD;  Location: [facility];  Service: Cardiovascular;;  . NECK SURGERY    . TRANSMETATARSAL AMPUTATION Left 10/16/2018   Procedure: TRANSMETATARSAL AMPUTATION LEFT FOOT;  Surgeon: Gwyneth Revels, DPM;  Location: [facility];  Service: Podiatry;  Laterality:  Left;    Social History   Socioeconomic History  . Marital status: Divorced    Spouse name: Not on file  . Number of children: Not on file  . Years of education: Not on file  . Highest education level: Not on file  Occupational History  . Not on file  Social  Needs  . Financial resource strain: Not on file  . Food insecurity:    Worry: Not on file    Inability: Not on file  . Transportation needs:    Medical: Not on file    Non-medical: Not on file  Tobacco Use  . Smoking status: Heavy Tobacco Smoker    Packs/day: 1.00    Years: 30.00    Pack years: 30.00    Types: Cigarettes  . Smokeless tobacco: Never Used  Substance and Sexual Activity  . Alcohol use: No  . Drug use: No  . Sexual activity: Not Currently  Lifestyle  . Physical activity:    Days per week: Not on file    Minutes per session: Not on file  . Stress: Not on file  Relationships  . Social connections:    Talks on phone: Not on file    Gets together: Not on file    Attends religious service: Not on file    Active member of club or organization: Not on file    Attends meetings of clubs or organizations: Not on file    Relationship status: Not on file  . Intimate partner violence:    Fear of current or ex partner: Not on file    Emotionally abused: Not on file    Physically abused: Not on file    Forced sexual activity: Not on file  Other Topics Concern  . Not on file  Social History Narrative  . Not on file    Family History  Problem Relation Age of Onset  . Diabetes Mother   . Coronary artery disease Father   . Hypertension Sister   . Leukemia Brother    No Known Allergies ? Current Facility-Administered Medications  Medication Dose Route Frequency Provider Last Rate Last Dose  . acetaminophen (TYLENOL) tablet 650 mg  650 mg Oral Q6H PRN Gwyneth Revels, DPM   650 mg at 10/18/18 1157   Or  . acetaminophen (TYLENOL) suppository 650 mg  650 mg Rectal Q6H PRN Gwyneth Revels, DPM      . atorvastatin (LIPITOR) tablet 80 mg  80 mg Oral q1800 Gwyneth Revels, DPM   80 mg at 10/17/18 1702  . clopidogrel (PLAVIX) tablet 75 mg  75 mg Oral Daily Altamese Dilling, MD   75 mg at 10/18/18 0929  . diclofenac sodium (VOLTAREN) 1 % transdermal gel 2 g  2 g Topical  QID Gwyneth Revels, DPM   2 g at 10/18/18 0930  . docusate sodium (COLACE) capsule 100 mg  100 mg Oral BID Gwyneth Revels, DPM   100 mg at 10/18/18 0930  . heparin injection 5,000 Units  5,000 Units Subcutaneous Q8H Gwyneth Revels, DPM   5,000 Units at 10/18/18 0505  . insulin aspart (novoLOG) injection 0-15 Units  0-15 Units Subcutaneous TID WC Gwyneth Revels, DPM   2 Units at 10/18/18 [phone]  . insulin aspart (novoLOG) injection 0-5 Units  0-5 Units Subcutaneous QHS Gwyneth Revels, DPM   2 Units at 10/17/18 2130  . insulin glargine (LANTUS) injection 15 Units  15 Units Subcutaneous QHS Gwyneth Revels, DPM   15 Units at 10/17/18 2131  .  morphine 2 MG/ML injection 2 mg  2 mg Intravenous Q3H PRN Gwyneth Revels, DPM      . nicotine (NICODERM CQ - dosed in mg/24 hours) patch 21 mg  21 mg Transdermal Daily Gwyneth Revels, DPM      . ondansetron [facility]) tablet 4 mg  4 mg Oral Q6H PRN Gwyneth Revels, DPM       Or  . ondansetron [facility]) injection 4 mg  4 mg Intravenous Q6H PRN Gwyneth Revels, DPM      . oxyCODONE (Oxy IR/ROXICODONE) immediate release tablet 5 mg  5 mg Oral Q4H PRN Gwyneth Revels, DPM   5 mg at 10/18/18 0644  . piperacillin-tazobactam (ZOSYN) IVPB 3.375 g  3.375 g Intravenous Q8H Gwyneth Revels, DPM 12.5 mL/hr at 10/18/18 0935 3.375 g at 10/18/18 0935  . polyethylene glycol (MIRALAX / GLYCOLAX) packet 17 g  17 g Oral Daily Gwyneth Revels, DPM   17 g at 10/18/18 4098  . polyethylene glycol (MIRALAX / GLYCOLAX) packet 17 g  17 g Oral Daily PRN Gwyneth Revels, DPM         Abtx:  Anti-infectives (From admission, onward)   Start     Dose/Rate Route Frequency Ordered Stop   10/15/18 1800  vancomycin (VANCOCIN) 1,500 mg in sodium chloride 0.9 % 500 mL IVPB  Status:  Discontinued     1,500 mg 250 mL/hr over 120 Minutes Intravenous Every 12 hours 10/14/18 1918 10/18/18 0909   10/14/18 1800  vancomycin (VANCOCIN) 2,000 mg in sodium chloride 0.9 % 500 mL IVPB     2,000 mg 250 mL/hr over 120  Minutes Intravenous  Once 10/14/18 1712 10/14/18 2020   10/14/18 1800  piperacillin-tazobactam (ZOSYN) IVPB 3.375 g     3.375 g 12.5 mL/hr over 240 Minutes Intravenous Every 8 hours 10/14/18 1712        REVIEW OF SYSTEMS:  Const: negative fever, negative chills, negative weight loss Eyes: negative diplopia or visual changes, negative eye pain ENT: negative coryza, negative sore throat Resp: negative cough, hemoptysis, dyspnea Cards: negative for chest pain, palpitations, lower extremity edema GU: negative for frequency, dysuria and hematuria GI: Negative for abdominal pain, diarrhea, bleeding, constipation Skin: negative for rash and pruritus Heme: negative for easy bruising and gum/nose bleeding MS: negative for myalgias, arthralgias, back pain and muscle weakness.  Left foot pain Neurolo:negative for headaches, dizziness, vertigo, memory problems  Psych: negative for feelings of anxiety, depression  Endocrine: No polyuria Allergy/Immunology-no allergies Objective:  VITALS:  BP (!) 125/99 (BP Location: Left Arm)   Pulse 81   Temp 98.6 F (37 C) (Oral)   Resp 18   SpO2 99%  PHYSICAL EXAM:  General: Alert, cooperative, no distress, appears stated age.  Head: Normocephalic, without obvious abnormality, atraumatic. Eyes: Conjunctivae clear, anicteric sclerae. Pupils are equal ENT Nares normal. No drainage or sinus tenderness. Lips, mucosa, and tongue normal. No Thrush Neck: Supple, symmetrical, no adenopathy, thyroid: non tender no carotid bruit and no JVD. Back: No CVA tenderness. Lungs: Clear to auscultation bilaterally. No Wheezing or Rhonchi. No rales. Heart: Regular rate and rhythm, no murmur, rub or gallop. Abdomen: Soft, non-tender,not distended. Bowel sounds normal. No masses Extremities: Left foot TMA site well coapted.  Minimal superficial ischemic changes of the skin on the lateral margin  10/18/18   .    10/16/18   skin: No rashes or lesions. Or  bruising Lymph: Cervical, supraclavicular normal. Neurologic: Grossly non-focal Pertinent Labs Lab Results CBC    Component Value Date/Time  WBC 10.7 (H) 10/18/2018 0534   RBC 4.50 10/18/2018 0534   HGB 13.0 10/18/2018 0534   HGB 14.1 11/17/2013 0621   HCT 38.5 (L) 10/18/2018 0534   HCT 40.1 11/17/2013 0621   PLT 284 10/18/2018 0534   PLT 218 11/17/2013 0621   MCV 85.6 10/18/2018 0534   MCV 86 11/17/2013 0621   MCH 28.9 10/18/2018 0534   MCHC 33.8 10/18/2018 0534   RDW 12.1 10/18/2018 0534   RDW 13.6 11/17/2013 0621   LYMPHSABS 2.9 08/16/2018 2245   LYMPHSABS 2.4 11/17/2013 0621   MONOABS 0.7 08/16/2018 2245   MONOABS 0.9 11/17/2013 0621   EOSABS 0.1 08/16/2018 2245   EOSABS 0.1 11/17/2013 0621   BASOSABS 0.0 08/16/2018 2245   BASOSABS 0.0 11/17/2013 0621    CMP Latest Ref Rng & Units 10/18/2018 10/17/2018 10/15/2018  Glucose 70 - 99 mg/dL 026(V) - 785(Y)  BUN 8 - 23 mg/dL 11 - 14  Creatinine 8.50 - 1.24 mg/dL 2.77 4.12 8.78  Sodium 135 - 145 mmol/L 135 - 136  Potassium 3.5 - 5.1 mmol/L 4.0 - 4.1  Chloride 98 - 111 mmol/L 101 - 102  CO2 22 - 32 mmol/L 28 - 28  Calcium 8.9 - 10.3 mg/dL 9.2 - 8.9  Total Protein 6.5 - 8.1 g/dL - - -  Total Bilirubin 0.3 - 1.2 mg/dL - - -  Alkaline Phos 38 - 126 U/L - - -  AST 15 - 41 U/L - - -  ALT 0 - 44 U/L - - -      Microbiology: Recent Results (from the past 240 hour(s))  MRSA PCR Screening     Status: None   Collection Time: 10/15/18  7:07 PM  Result Value Ref Range Status   MRSA by PCR NEGATIVE NEGATIVE Final    Comment:        The GeneXpert MRSA Assay (FDA approved for NASAL specimens only), is one component of a comprehensive MRSA colonization surveillance program. It is not intended to diagnose MRSA infection nor to guide or monitor treatment for MRSA infections. Performed at [facility], [address] [place], [place] [address]   Aerobic/Anaerobic Culture (surgical/deep wound)     Status: None  (Preliminary result)   Collection Time: 10/16/18  9:21 AM  Result Value Ref Range Status   Specimen Description   Final    FOOT LEFT Performed at [facility], [address] [place], [place] [address]    Special Requests   Final    NONE Performed at [facility], [address] [place], [place] [address]    Gram Stain   Final    NO WBC SEEN NO ORGANISMS SEEN Performed at [facility], [address], [place], [place] [address]    Culture RARE PSEUDOMONAS AERUGINOSA  Final   Report Status PENDING  Incomplete   IMAGING RESULTS: ? Impression/Recommendation ?63 y.o. male with a history of DM , peripheral vascular disease, chronic left foot infection was admitted with worsening necrotic wound of the left foot on 10/14/18.  Patient has a complicated left foot infection history. Has has had left foot wound with infections for the past 5 years.  He has had peripheral revascularization and is also had IV antibiotics in the past for osteomyelitis.  Been followed by podiatry on a regular basis.In May 2019 he underwent left second toe amputation for gangrene.During that hospitalization he also had angioplasty of the left peroneal artery and tibioperoneal trunk.  Also  had angioplasty of the left  SFA and popliteal arteries.  In December 2019 he was admitted again for .  full-thickness ulceration and infection of the left foot.  He was seen by Dr. Alberteen Spindlecline and had debridement of the wound and bone on 09/30/2018 .  Had second metatarsal head resection.    The culture was mixed aerobes and anaerobes..He was seen by vascular and underwent percutaneous transluminal angioplasty of the left anterior tibial artery, entire left SFA and popliteal artery, Left tibioperoneal trunk and stent placement to the left SFA and popliteal arteries for residual stenosis after angioplasty.  While in the hospital he was on IV antibiotics broad-spectrum with Vanco and cefepime and Flagyl and he was  discharged on 10/04/2018 on p.o. antibiotics which was Doxy and Augmentin.Marland Kitchen. He returned to the ED on 10/11/2018 with dehiscence of the wound amputation site. HE was admitted on 10/14/18.  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 Ether GriffinsFowler.  Deep wound cultures were sent. I am asked to see the patient and advised antibiotics. The culture is positive for pseudomonas aeruginosa and Proteus vulgaris.   Necrotic infection of the left foot at the amputation site now status post transmetatarsal amputation.  Culture positive for Pseudomonas and Proteus.  Currently on Zosyn.  Observe closely at the TMA site for appearance of progression of ischemia.  he may need another 2 to 3 weeks of IV antibiotics for soft tissue infection and also for because of recurrence.  .Diabetes mellitus: Management as per primary team  Peripheral arterial disease: h/o of angioplasty and stents to the left leg.  On Plavix  ?Hypertension on no meds now  ___Hyperlipidemia on atorvastatin Patient seen with Dr. Ether GriffinsFowler ________________________________________________ Discussed with patient, Note:  This document was prepared using Dragon voice recognition software and may include unintentional dictation errors.

## 2018-10-18 NOTE — Plan of Care (Signed)
  Problem: Education: Goal: Knowledge of General Education information will improve Description: Including pain rating scale, medication(s)/side effects and non-pharmacologic comfort measures Outcome: Progressing   Problem: Health Behavior/Discharge Planning: Goal: Ability to manage health-related needs will improve Outcome: Progressing   Problem: Clinical Measurements: Goal: Ability to maintain clinical measurements within normal limits will improve Outcome: Progressing Goal: Will remain free from infection Outcome: Progressing Goal: Diagnostic test results will improve Outcome: Progressing Goal: Respiratory complications will improve Outcome: Progressing   Problem: Nutrition: Goal: Adequate nutrition will be maintained Outcome: Progressing   Problem: Pain Managment: Goal: General experience of comfort will improve Outcome: Progressing   

## 2018-10-18 NOTE — Progress Notes (Signed)
Daily Progress Note   Subjective  - 2 Days Post-Op F/u TMA left   Objective Vitals:   10/17/18 0825 10/17/18 1651 10/17/18 2339 10/18/18 0745  BP: 113/77 (!) 141/81 118/80 (!) 125/99  Pulse: 94 76 69 81  Resp: 18 18 17 18   Temp: 97.8 F (36.6 C)  98.9 F (37.2 C) 98.6 F (37 C)  TempSrc:   Oral Oral  SpO2: 100% 100% 98% 99%    Physical Exam: Wound is stable.  No purulence.  Most of incision is healing well with small area of dark discoloration where skin circulation may be compromised.     Laboratory CBC    Component Value Date/Time   WBC 10.7 (H) 10/18/2018 0534   HGB 13.0 10/18/2018 0534   HGB 14.1 11/17/2013 0621   HCT 38.5 (L) 10/18/2018 0534   HCT 40.1 11/17/2013 0621   PLT 284 10/18/2018 0534   PLT 218 11/17/2013 0621    BMET    Component Value Date/Time   NA 135 10/18/2018 0534   NA 135 (L) 11/17/2013 0621   K 4.0 10/18/2018 0534   K 4.3 11/17/2013 0621   CL 101 10/18/2018 0534   CL 99 11/17/2013 0621   CO2 28 10/18/2018 0534   CO2 31 11/17/2013 0621   GLUCOSE 193 (H) 10/18/2018 0534   GLUCOSE 172 (H) 11/17/2013 0621   BUN 11 10/18/2018 0534   BUN 10 11/17/2013 0621   CREATININE 0.73 10/18/2018 0534   CREATININE 0.85 11/17/2013 0621   CALCIUM 9.2 10/18/2018 0534   CALCIUM 9.6 11/17/2013 0621   GFRNONAA >60 10/18/2018 0534   GFRNONAA >60 11/17/2013 0621   GFRAA >60 10/18/2018 0534   GFRAA >60 11/17/2013 1610    Assessment/Planning: SP TMA left   Vascular to perform angio likely tomorrow.  C/W strict NWB to left foot.  ID consulted for IV abx.  Culture growing pseudomonas.  Will follow.  Gwyneth Revels A  10/18/2018, 11:58 AM

## 2018-10-18 NOTE — Progress Notes (Signed)
[employer] [facility] [place] [facility]   PATIENT NAME: Austin [name]    MR#:  [id number]  DATE OF BIRTH:  01-07-1956  SUBJECTIVE:  CHIEF COMPLAINT:  No chief complaint on file. Came with worsening wound on foot.  Status post surgery no complaints.  REVIEW OF SYSTEMS:  CONSTITUTIONAL: No fever, fatigue or weakness.  EYES: No blurred or double vision.  EARS, NOSE, AND THROAT: No tinnitus or ear pain.  RESPIRATORY: No cough, shortness of breath, wheezing or hemoptysis.  CARDIOVASCULAR: No chest pain, orthopnea, edema.  GASTROINTESTINAL: No nausea, vomiting, diarrhea or abdominal pain.  GENITOURINARY: No dysuria, hematuria.  ENDOCRINE: No polyuria, nocturia,  HEMATOLOGY: No anemia, easy bruising or bleeding SKIN: No rash or lesion. MUSCULOSKELETAL: No joint pain or arthritis.   NEUROLOGIC: No tingling, numbness, weakness.  PSYCHIATRY: No anxiety or depression.   ROS  DRUG ALLERGIES:  No Known Allergies  VITALS:  Blood pressure 129/71, pulse 80, temperature 98.1 F (36.7 C), temperature source Oral, resp. rate 18, SpO2 100 %.  PHYSICAL EXAMINATION:  GENERAL:  63 y.o.-year-old patient lying in the bed with no acute distress.  EYES: Pupils equal, round, reactive to light and accommodation. No scleral icterus. Extraocular muscles intact.  HEENT: Head atraumatic, normocephalic. Oropharynx and nasopharynx clear.  NECK:  Supple, no jugular venous distention. No thyroid enlargement, no tenderness.  LUNGS: Normal breath sounds bilaterally, no wheezing, rales,rhonchi or crepitation. No use of accessory muscles of respiration.  CARDIOVASCULAR: S1, S2 normal. No murmurs, rubs, or gallops.  ABDOMEN: Soft, nontender, nondistended. Bowel sounds present. No organomegaly or mass.  EXTREMITIES: No pedal edema, cyanosis, or clubbing.left foot-status post transmetatarsal amputation and dressing present  NEUROLOGIC: Cranial nerves II through XII are intact. Muscle strength 4/5 in all  extremities. Sensation intact. Gait not checked.  PSYCHIATRIC: The patient is alert and oriented x 3.  SKIN: No obvious rash, lesion, or ulcer.   Physical Exam LABORATORY PANEL:   CBC Recent Labs  Lab 10/18/18 0534  WBC 10.7*  HGB 13.0  HCT 38.5*  PLT 284   ------------------------------------------------------------------------------------------------------------------  Chemistries  Recent Labs  Lab 10/18/18 0534  NA 135  K 4.0  CL 101  CO2 28  GLUCOSE 193*  BUN 11  CREATININE 0.73  CALCIUM 9.2   ------------------------------------------------------------------------------------------------------------------  Cardiac Enzymes No results for input(s): TROPONINI in the last 168 hours. ------------------------------------------------------------------------------------------------------------------  RADIOLOGY:  No results found.  ASSESSMENT AND PLAN:   Active Problems:   Diabetic foot infection (HCC)  * Left diabetic foot wound- s/p debridement and first and second ray amputation on 12/25.  S/p left lower extremity angiogram 12/26. 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 - On empiric IV antibiotics - Pain control -Podiatry suggested to start nonweightbearing exercise and likely requirement of 3 to 4 weeks of IV antibiotic and advised to get ID consult. Spoke to Dr. Rivka Safer.  * Uncontrolled type 2 diabetes- recent A1c 8.1%. - Lantus 15 units nightly and SSI  * History of DVT- unclear when this occurred -Hold Eliquis for surgery -Subcutaneous heparin for DVT prophylaxis  -We will resume Eliquis once podiatry has no other plans.  * Peripheral vascular disease- s/p angiogram 12/26 by Dr. Wyn Quaker. -Holding Plavix for surgery-resume now.  * Hyperlipidemia-stable -Continue home Lipitor   All the records are reviewed and case discussed with Care Management/Social Workerr. Management plans discussed with the patient, family and they are in  agreement.  CODE STATUS: Full.  TOTAL TIME TAKING CARE OF THIS PATIENT: 35 minutes.  POSSIBLE D/C IN 1-2 DAYS, DEPENDING ON CLINICAL CONDITION.   Altamese Dilling M.D on 10/18/2018   Between 7am to 6pm - Pager - [phone]  After 6pm go to [email or web address] - password [email or web address]  [facility]  Office  [phone]  CC: Primary care physician; Preston Fleeting, MD  Note: This dictation was prepared with Dragon dictation along with smaller phrase technology. Any transcriptional errors that result from this process are unintentional.

## 2018-10-18 NOTE — Progress Notes (Signed)
Physical Therapy Treatment Patient Details Name: Austin [name] MRN: [id number] DOB: 1956-10-07 Today's Date: 10/18/2018    History of Present Illness Austin [name]  is a 63 y.o. male with a known history of type 2 diabetes and history DVT who was directly admitted from the podiatry office for persistent left lower extremity diabetic foot infection.  He underwent debridement and first and second toe amputations on 09/30/2018.  He has been on antibiotics as an outpatient, but his wound has continued to worsen.  Underwent left lower extremity angiogram on 10/01/2018 with angioplasty and stent placement.  He denies any fevers or chills.  He endorses some mild left foot pain.  No lower extremity edema. He underwent L transmetatarsal amputation and is POD#1 at time of PT evaluation. No reported post-op complications.     PT Comments    Patient initially declined physical therapy treatment but after explanation of why he would benefit from it agreed to at least do some exercises in bed. Patient has noted L knee flexion contracture that improved with repetitive prolonged hold into knee extension. Continue with current POC as patient will continue to benefit from skilled physical therapy to improve strength, ROM, transfers, and mobility to increase functional independence for quality of life.    Follow Up Recommendations  SNF     Equipment Recommendations  None recommended by PT;Other (comment)(Must use walker at discharge)    Recommendations for Other Services       Precautions / Restrictions Precautions Precautions: Fall Restrictions Weight Bearing Restrictions: Yes LLE Weight Bearing: Non weight bearing    Mobility  Bed Mobility Overal bed mobility: Modified Independent             General bed mobility comments: Use of trapeze to reposition patient in bed (raising patient to allow room for feet)   Transfers                    Ambulation/Gait                  Stairs             Wheelchair Mobility    Modified Rankin (Stroke Patients Only)       Balance                                            Cognition Arousal/Alertness: Awake/alert Behavior During Therapy: WFL for tasks assessed/performed Overall Cognitive Status: Within Functional Limits for tasks assessed                                 General Comments: Follows instructions consistently      Exercises Other Exercises Other Exercises: Supine: AAROM heel slides RLE with 30 second overpressure of knee into extension to reduce knee flexion contracture, AAROM abduction adduction 10x,  AAROM SLR 10x with increasing knee flexion upon fatigue, Other Exercises: Supine strengthening: quad set with mod tactile cueing and quick stretch technique 10x 3 second holds, glute squeezes 10x 3 second holds     General Comments        Pertinent Vitals/Pain Pain Assessment: 0-10 Pain Score: 8  Pain Location: L foot  Pain Descriptors / Indicators: Aching Pain Intervention(s): Monitored during session    Home Living  Prior Function            PT Goals (current goals can now be found in the care plan section) Acute Rehab PT Goals Patient Stated Goal: To return home and to regular daily activity as soon as possible. PT Goal Formulation: With patient Time For Goal Achievement: 10/31/18 Potential to Achieve Goals: Good Progress towards PT goals: Progressing toward goals    Frequency    7X/week      PT Plan Current plan remains appropriate    Co-evaluation              AM-PAC PT "6 Clicks" Mobility   Outcome Measure  Help needed turning from your back to your side while in a flat bed without using bedrails?: None Help needed moving from lying on your back to sitting on the side of a flat bed without using bedrails?: None Help needed moving to and from a bed to a chair (including a wheelchair)?: A  Lot Help needed standing up from a chair using your arms (e.g., wheelchair or bedside chair)?: A Lot Help needed to walk in hospital room?: Total Help needed climbing 3-5 steps with a railing? : Total 6 Click Score: 14    End of Session Equipment Utilized During Treatment: Gait belt Activity Tolerance: Patient tolerated treatment well Patient left: in bed;with call bell/phone within reach;with bed alarm set Nurse Communication: Mobility status PT Visit Diagnosis: Unsteadiness on feet (R26.81);Other abnormalities of gait and mobility (R26.89);Pain;Muscle weakness (generalized) (M62.81) Pain - Right/Left: Left Pain - part of body: Ankle and joints of foot     Time: 1416-1433 PT Time Calculation (min) (ACUTE ONLY): 17 min  Charges:  $Therapeutic Exercise: 8-22 mins                     Precious Bard, PT, DPT     Precious Bard 10/18/2018, 2:44 PM

## 2018-10-19 ENCOUNTER — Encounter
Admission: AD | Disposition: A | Discharge status: Skilled Nursing Facility | Payer: Self-pay | Source: Home / Self Care | Attending: Internal Medicine

## 2018-10-19 ENCOUNTER — Other Ambulatory Visit (INDEPENDENT_AMBULATORY_CARE_PROVIDER_SITE_OTHER): Payer: Self-pay | Admitting: Vascular Surgery

## 2018-10-19 ENCOUNTER — Inpatient Hospital Stay: Payer: Self-pay

## 2018-10-19 DIAGNOSIS — I70245 Atherosclerosis of native arteries of left leg with ulceration of other part of foot: Secondary | ICD-10-CM

## 2018-10-19 DIAGNOSIS — I7092 Chronic total occlusion of artery of the extremities: Secondary | ICD-10-CM

## 2018-10-19 DIAGNOSIS — L97529 Non-pressure chronic ulcer of other part of left foot with unspecified severity: Secondary | ICD-10-CM

## 2018-10-19 HISTORY — PX: LOWER EXTREMITY ANGIOGRAPHY: CATH118251

## 2018-10-19 LAB — CBC
HCT: 38 % — ABNORMAL LOW (ref 39.0–52.0)
HCT: 37.6 % — ABNORMAL LOW (ref 39.0–52.0)
Hemoglobin: 12.7 g/dL — ABNORMAL LOW (ref 13.0–17.0)
Hemoglobin: 12.5 g/dL — ABNORMAL LOW (ref 13.0–17.0)
MCH: 28.7 pg (ref 26.0–34.0)
MCH: 28.7 pg (ref 26.0–34.0)
MCHC: 33.4 g/dL (ref 30.0–36.0)
MCHC: 33.2 g/dL (ref 30.0–36.0)
MCV: 86 fL (ref 80.0–100.0)
MCV: 86.2 fL (ref 80.0–100.0)
Platelets: 282 10*3/uL (ref 150–400)
Platelets: 284 10*3/uL (ref 150–400)
RBC: 4.42 MIL/uL (ref 4.22–5.81)
RBC: 4.36 MIL/uL (ref 4.22–5.81)
RDW: 12.5 % (ref 11.5–15.5)
RDW: 12.5 % (ref 11.5–15.5)
WBC: 13.6 10*3/uL — ABNORMAL HIGH (ref 4.0–10.5)
WBC: 14.4 10*3/uL — ABNORMAL HIGH (ref 4.0–10.5)
nRBC: 0 % (ref 0.0–0.2)
nRBC: 0 % (ref 0.0–0.2)

## 2018-10-19 LAB — GLUCOSE, CAPILLARY
Glucose-Capillary: 151 mg/dL — ABNORMAL HIGH (ref 70–99)
Glucose-Capillary: 136 mg/dL — ABNORMAL HIGH (ref 70–99)
Glucose-Capillary: 140 mg/dL — ABNORMAL HIGH (ref 70–99)
Glucose-Capillary: 124 mg/dL — ABNORMAL HIGH (ref 70–99)
Glucose-Capillary: 162 mg/dL — ABNORMAL HIGH (ref 70–99)
Glucose-Capillary: 232 mg/dL — ABNORMAL HIGH (ref 70–99)

## 2018-10-19 LAB — FIBRINOGEN
Fibrinogen: >750 mg/dL — ABNORMAL HIGH (ref 210–475)
Fibrinogen: 732 mg/dL — ABNORMAL HIGH (ref 210–475)

## 2018-10-19 LAB — HEPARIN LEVEL (UNFRACTIONATED)
Heparin Unfractionated: <0.1 IU/mL — ABNORMAL LOW (ref 0.30–0.70)
Heparin Unfractionated: <0.1 IU/mL — ABNORMAL LOW (ref 0.30–0.70)

## 2018-10-19 SURGERY — LOWER EXTREMITY ANGIOGRAPHY
Anesthesia: Moderate Sedation | Laterality: Left

## 2018-10-19 MED ORDER — MIDAZOLAM HCL 2 MG/2ML IJ SOLN
INTRAMUSCULAR | Status: DC | PRN
  Administered 2018-10-19 (×2): 1 mg via INTRAVENOUS
  Administered 2018-10-19: 2 mg via INTRAVENOUS
  Administered 2018-10-19: 1 mg via INTRAVENOUS

## 2018-10-19 MED ORDER — SODIUM CHLORIDE 0.9 % IV SOLN
1.0000 mg/h | INTRAVENOUS | Status: DC
  Administered 2018-10-19 – 2018-10-20 (×2): 1 mg/h
  Filled 2018-10-19 (×6): qty 10

## 2018-10-19 MED ORDER — HALOPERIDOL LACTATE 5 MG/ML IJ SOLN
2.0000 mg | Freq: Once | INTRAMUSCULAR | Status: AC
  Administered 2018-10-20: 2 mg via INTRAVENOUS
  Filled 2018-10-19: qty 1

## 2018-10-19 MED ORDER — FAMOTIDINE 20 MG PO TABS: 40.0000 mg | ORAL_TABLET | ORAL | Status: DC | PRN

## 2018-10-19 MED ORDER — SODIUM CHLORIDE 0.9% FLUSH: 3.0000 mL | INTRAVENOUS | Status: DC | PRN

## 2018-10-19 MED ORDER — DIPHENHYDRAMINE HCL 50 MG/ML IJ SOLN
INTRAMUSCULAR | Status: DC | PRN
  Administered 2018-10-19: 50 mg via INTRAVENOUS

## 2018-10-19 MED ORDER — FENTANYL CITRATE (PF) 100 MCG/2ML IJ SOLN
INTRAMUSCULAR | Status: DC | PRN
  Administered 2018-10-19 (×2): 25 ug via INTRAVENOUS
  Administered 2018-10-19: 50 ug via INTRAVENOUS

## 2018-10-19 MED ORDER — HEPARIN (PORCINE) 25000 UT/250ML-% IV SOLN
INTRAVENOUS | Status: AC
  Filled 2018-10-19: qty 250

## 2018-10-19 MED ORDER — MORPHINE SULFATE (PF) 2 MG/ML IV SOLN
INTRAVENOUS | Status: AC
  Filled 2018-10-19: qty 2

## 2018-10-19 MED ORDER — MORPHINE SULFATE (PF) 4 MG/ML IV SOLN
5.0000 mg | INTRAVENOUS | Status: DC | PRN
  Administered 2018-10-19: 4 mg via INTRAVENOUS
  Administered 2018-10-20: 5 mg via INTRAVENOUS
  Filled 2018-10-19: qty 2

## 2018-10-19 MED ORDER — DIPHENHYDRAMINE HCL 50 MG/ML IJ SOLN: 50.0000 mg | Freq: Once | INTRAMUSCULAR | Status: DC

## 2018-10-19 MED ORDER — CLINDAMYCIN PHOSPHATE 300 MG/50ML IV SOLN
300.0000 mg | Freq: Once | INTRAVENOUS | Status: AC
  Administered 2018-10-19: 300 mg via INTRAVENOUS
  Filled 2018-10-19: qty 50

## 2018-10-19 MED ORDER — LIDOCAINE-EPINEPHRINE (PF) 1 %-1:200000 IJ SOLN
INTRAMUSCULAR | Status: AC
  Filled 2018-10-19: qty 10

## 2018-10-19 MED ORDER — ALTEPLASE 2 MG IJ SOLR
INTRAMUSCULAR | Status: AC
  Filled 2018-10-19: qty 8

## 2018-10-19 MED ORDER — HEPARIN (PORCINE) 25000 UT/250ML-% IV SOLN
800.0000 [IU]/h | INTRAVENOUS | Status: DC
  Administered 2018-10-19: 800 [IU]/h via INTRAVENOUS

## 2018-10-19 MED ORDER — MIDAZOLAM HCL 5 MG/5ML IJ SOLN
INTRAMUSCULAR | Status: AC
  Filled 2018-10-19: qty 5

## 2018-10-19 MED ORDER — SODIUM CHLORIDE 0.9 % IV SOLN
250.0000 mL | INTRAVENOUS | Status: DC | PRN
  Administered 2018-10-23 – 2018-10-28 (×7): 250 mL via INTRAVENOUS
  Administered 2018-10-28: 12:00:00 via INTRAVENOUS
  Administered 2018-10-29: 250 mL via INTRAVENOUS

## 2018-10-19 MED ORDER — IOPAMIDOL (ISOVUE-300) INJECTION 61%
INTRAVENOUS | Status: DC | PRN
  Administered 2018-10-19: 65 mL via INTRA_ARTERIAL

## 2018-10-19 MED ORDER — METHYLPREDNISOLONE SODIUM SUCC 125 MG IJ SOLR: 125.0000 mg | INTRAMUSCULAR | Status: DC | PRN

## 2018-10-19 MED ORDER — DIPHENHYDRAMINE HCL 50 MG/ML IJ SOLN
INTRAMUSCULAR | Status: AC
  Filled 2018-10-19: qty 1

## 2018-10-19 MED ORDER — HEPARIN (PORCINE) IN NACL 1000-0.9 UT/500ML-% IV SOLN
INTRAVENOUS | Status: AC
  Filled 2018-10-19: qty 1000

## 2018-10-19 MED ORDER — CLINDAMYCIN PHOSPHATE 300 MG/50ML IV SOLN
INTRAVENOUS | Status: AC
  Filled 2018-10-19: qty 50

## 2018-10-19 MED ORDER — HYDROMORPHONE HCL 1 MG/ML IJ SOLN
INTRAMUSCULAR | Status: AC
  Filled 2018-10-19: qty 1

## 2018-10-19 MED ORDER — ONDANSETRON HCL 4 MG/2ML IJ SOLN: 4.0000 mg | Freq: Four times a day (QID) | INTRAMUSCULAR | Status: DC | PRN

## 2018-10-19 MED ORDER — METOPROLOL TARTRATE 5 MG/5ML IV SOLN
2.5000 mg | Freq: Four times a day (QID) | INTRAVENOUS | Status: DC | PRN
  Administered 2018-10-21: 5 mg via INTRAVENOUS
  Filled 2018-10-19: qty 5

## 2018-10-19 MED ORDER — DEXMEDETOMIDINE HCL IN NACL 400 MCG/100ML IV SOLN
0.4000 ug/kg/h | INTRAVENOUS | Status: DC
  Administered 2018-10-19: 0.6 ug/kg/h via INTRAVENOUS
  Administered 2018-10-20 – 2018-10-21 (×8): 1.2 ug/kg/h via INTRAVENOUS
  Filled 2018-10-19 (×10): qty 100

## 2018-10-19 MED ORDER — FENTANYL CITRATE (PF) 100 MCG/2ML IJ SOLN
INTRAMUSCULAR | Status: AC
  Filled 2018-10-19: qty 2

## 2018-10-19 MED ORDER — SODIUM CHLORIDE 0.9% FLUSH
3.0000 mL | Freq: Two times a day (BID) | INTRAVENOUS | Status: DC
  Administered 2018-10-19 – 2018-10-30 (×19): 3 mL via INTRAVENOUS

## 2018-10-19 MED ORDER — MIDAZOLAM HCL 2 MG/2ML IJ SOLN
1.0000 mg | INTRAMUSCULAR | Status: DC | PRN
  Administered 2018-10-28: 2 mg via INTRAVENOUS
  Filled 2018-10-19: qty 1

## 2018-10-19 MED ORDER — HEPARIN SODIUM (PORCINE) 1000 UNIT/ML IJ SOLN
INTRAMUSCULAR | Status: AC
  Filled 2018-10-19: qty 1

## 2018-10-19 MED ORDER — SODIUM CHLORIDE 0.9 % IV SOLN
INTRAVENOUS | Status: DC
  Administered 2018-10-19: 14:00:00 via INTRAVENOUS

## 2018-10-19 MED ORDER — SODIUM CHLORIDE 0.9 % IV SOLN
INTRAVENOUS | Status: DC
  Administered 2018-10-20 (×2): via INTRAVENOUS

## 2018-10-19 MED ORDER — HYDROMORPHONE HCL 1 MG/ML IJ SOLN
1.0000 mg | Freq: Once | INTRAMUSCULAR | Status: AC | PRN
  Administered 2018-10-19: 1 mg via INTRAVENOUS

## 2018-10-19 MED ORDER — ALTEPLASE 2 MG IJ SOLR
INTRAMUSCULAR | Status: DC | PRN
  Administered 2018-10-19: 8 mg

## 2018-10-19 SURGICAL SUPPLY — 18 items
BALLN LUTONIX 5X220X130 (BALLOONS) ×3
BALLN ULTRVRSE 3X220X150 (BALLOONS) ×3
BALLOON LUTONIX 5X220X130 (BALLOONS) ×1 IMPLANT
BALLOON ULTRVRSE 3X220X150 (BALLOONS) ×1 IMPLANT
CANISTER PENUMBRA ENGINE (MISCELLANEOUS) ×3 IMPLANT
CATH INDIGO CAT6 KIT (CATHETERS) ×3 IMPLANT
CATH INFUS 135CMX50CM (CATHETERS) ×3 IMPLANT
CATH PIG 70CM (CATHETERS) ×3 IMPLANT
DEVICE PRESTO INFLATION (MISCELLANEOUS) ×6 IMPLANT
GLIDEWIRE ADV .035X260CM (WIRE) ×3 IMPLANT
KIT CATH CVC 3 LUMEN 7FR 8IN (MISCELLANEOUS) ×3 IMPLANT
PACK ANGIOGRAPHY (CUSTOM PROCEDURE TRAY) ×3 IMPLANT
SHEATH BRITE TIP 5FRX11 (SHEATH) ×3 IMPLANT
SHEATH PINNACLE ST 6F 45CM (SHEATH) ×3 IMPLANT
SYR MEDRAD MARK V 150ML (SYRINGE) ×3 IMPLANT
TUBING CONTRAST HIGH PRESS 72 (TUBING) ×3 IMPLANT
WIRE G V18X300CM (WIRE) ×3 IMPLANT
WIRE J 3MM .035X145CM (WIRE) ×3 IMPLANT

## 2018-10-19 NOTE — Op Note (Signed)
[facility]  Percutaneous Study/Intervention Procedural Note   Date of Surgery: 10/19/2018  Surgeon(s):Tyrus Wilms    Assistants:none  Pre-operative Diagnosis: PAD with ulceration LLE, recurrent ischemia after previous intervention  Post-operative diagnosis:  Same  Procedure(s) Performed:             1.  Ultrasound guidance for vascular access right femoral artery             2.  Catheter placement into left common femoral artery from right femoral approach             3.  Aortogram and selective left lower extremity angiogram             4.   Catheter directed thrombolytic therapy with 8 mg of TPA placed through an infusion catheter in the left SFA, popliteal arteries, and tibioperoneal trunk             5.   Mechanical thrombectomy with the penumbra cat 6 device to the left SFA, popliteal artery, tibioperoneal trunk, and posterior tibial artery  6.  Percutaneous transluminal angioplasty of the left SFA and popliteal arteries with 5 mm diameter by 22 cm length [name] drug-coated angioplasty balloon inflated 3 times             7.   Percutaneous transluminal angioplasty of the left tibioperoneal trunk and proximal to mid posterior tibial arteries with 3 mm diameter by 22 cm length angioplasty balloon  8.  Placement of an infusion catheter for continued thrombolytic therapy in the left SFA, popliteal artery, and tibioperoneal trunk  9.  Placement of a right femoral venous triple-lumen catheter with ultrasound and fluoroscopic guidance  EBL: 100 cc  Contrast: 65 cc  Fluoro Time: 6 minutes  Moderate Conscious Sedation Time: approximately 45 minutes using 5 mg of Versed and 100 Mcg of Fentanyl              Indications:  Patient is a 63 y.o.male with continued ischemia of the left leg despite recent intervention about 3 weeks ago.  He returned with worsening foot ulcerations. The patient is brought in for angiography for further evaluation and potential treatment.   Due to the limb threatening nature of the situation, angiogram was performed for attempted limb salvage. The patient is aware that if the procedure fails, amputation would be expected.  The patient also understands that even with successful revascularization, amputation may still be required due to the severity of the situation.  Risks and benefits are discussed and informed consent is obtained.   Procedure:  The patient was identified and appropriate procedural time out was performed.  The patient was then placed supine on the table and prepped and draped in the usual sterile fashion. Moderate conscious sedation was administered during a face to face encounter with the patient throughout the procedure with my supervision of the RN administering medicines and monitoring the patient's vital signs, pulse oximetry, telemetry and mental status throughout from the start of the procedure until the patient was taken to the recovery room. Ultrasound was used to evaluate the right common femoral artery.  It was patent .  A digital ultrasound image was acquired.  A Seldinger needle was used to access the right common femoral artery under direct ultrasound guidance and a permanent image was performed.  A 0.035 J wire was advanced without resistance and a 5Fr sheath was placed.  Pigtail catheter was placed into the aorta and an AP aortogram was performed. This demonstrated  normal renal arteries and normal aorta and iliac segments without significant stenosis. I then crossed the aortic bifurcation and advanced to the left femoral head. Selective left lower extremity angiogram was then performed. This demonstrated recurrent flush occlusion of the left SFA.  There appeared to be faint reconstitution of the posterior tibial artery distally and possibly an anterior tibial artery but patient motion made this very difficult to discern. It was felt that it was in the patient's best interest to proceed with intervention after these  images to avoid a second procedure and a larger amount of contrast and fluoroscopy based off of the findings from the initial angiogram. The patient was systemically heparinized and a 6 [place] [employer] sheath was then placed over the [name] wire. I then used a Kumpe catheter and the advantage wire to easily navigate through the occlusion and down into the tibioperoneal trunk.  I then used a 130 cm total length 50 cm working length lytic catheter and instilled 8 mg of TPA into the left SFA, popliteal artery, and tibioperoneal trunk.  After this was allowed to dwell, the penumbra cat 6 device was used with mechanical thrombectomy to the left SFA, popliteal artery, tibioperoneal trunk, and the proximal portion of the posterior tibial artery as well.  The wire was actually parked with foot and intraluminal flow in the posterior tibial artery was confirmed in the mid posterior tibial artery prior to thrombectomy.  Following this, there was still essentially no with a residual long occlusion.  I then tried angioplasty.  I used a 5 mm diameter by 22 cm length [name] drug-coated angioplasty balloon.  Started in the proximal SFA and common femoral artery and inflated this to 12 atm for 1 minute.  Was then advanced to the mid to distal SFA and a third inflation was performed in the popliteal artery below the previously placed stent and within the distal portion of the previously placed stents.  The distal 2 inflations were 10 to 12 atm for a minute.  The tibioperoneal trunk and posterior tibial arteries were then treated with a 3 mm diameter by 22 cm length angioplasty balloon inflated to 8 atm for 1 minute and the proximal to mid posterior tibial artery up through the tibioperoneal trunk.  Imaging following this showed still no flow distally with continued thrombosis.  At this point, I felt we had no option other than overnight thrombolytic therapy for an attempted limb salvage.  The 130 cm total length 50 cm  working length catheter was parked with its distal tip just into the tibioperoneal trunk and going up through the popliteal and superficial femoral arteries.  I then placed a central line using ultrasound.  I visualized a patent right femoral vein and accessed this without difficulty with a Seldinger needle and a permanent image was recorded.  A J-wire was placed.  After skin nick and dilatation a triple-lumen catheter was placed over the wire and the wire was removed.  The catheter tip was parked at the iliac vein confluence to the IVC.  It was secured to the skin with 2 silk sutures. I elected to terminate the procedure. The patient was taken to the recovery room in stable condition having tolerated the procedure well.  Findings:               Aortogram:  normal aorta and iliac arteries without significant stenosis.  Renal flow appeared normal             Left Lower  Extremity:  Recurrent flush occlusion of the left SFA.  There appeared to be faint reconstitution of the posterior tibial artery distally and possibly an anterior tibial artery but patient motion made this very difficult to discern.   Disposition: Patient was taken to the recovery room in stable condition having tolerated the procedure well.  Complications: None  Leotis Pain 10/19/2018 4:56 PM   This note was created with Dragon Medical transcription system. Any errors in dictation are purely unintentional.

## 2018-10-19 NOTE — Progress Notes (Addendum)
Per [facility] admissions coordinator at [facility] authorization has been received. Plan is for patient to D/C to [facility] tomorrow pending medical clearance.   [employer], LCSW [phone]

## 2018-10-19 NOTE — Progress Notes (Signed)
63 yo male with hx of PAD directly admitted to [facility] 01/8 with persistent left lower extremity diabetic foot infection.  Who underwent LLE debridement and first and second toe amputations on 09/30/18.  He has been on outpatient antibiotics, however symptoms worsened.  He underwent LLE angiogram with angioplasty and stent placement on 10/01/18. However, due to worsening symptoms on 01/8 he was directly admitted to [facility] from the podiatry office. He was found to have ischemic gangrene of the left forefoot and underwent a transmetatarsal amputation of the left foot with application of wound vac on 10/14/2018.  On 10/19/2018 he underwent left lower extremity arteriogram, catheter directed TPA, mechanical thrombectomy L SFA, popliteal arteries, and tibioperoneal trunk.  He was admitted to ICU postop for TPA and heparin gtt infusions overnight with plans to return back to the special procedures for angiogram on 10/19/2018.  Pt currently alert and oriented, bilateral 2+ distal pulses via doppler, left lower extremity dressing dry and intact, and vss.  PCCM team will assist with plan of care continue abx per ID recommendations, prn dilaudid and oxycodone for pain management, and SSI for blood sugar management.    Sonda Rumble, AGNP  Pulmonary/Critical Care Pager [phone] (please enter 7 digits) PCCM Consult Pager [phone] (please enter 7 digits)

## 2018-10-19 NOTE — Progress Notes (Signed)
[facility]  Daily Progress Note  Subjective: Patient well known to our service. Most recent left lower extremity intervention was 10/01/18 by Dr. Wyn Quakerew. During TMA amputation by podiatry robust blood flow to the amputation site was not noted. The patient understands this and in agreement with moving forward with a repeat left lower extremity angiogram with assess his anatomy and degree of contributing peripheral artery disease. If appropriate at this time, an a attempt to revascularize the extremity can be made at that time.   Objective: Vitals:   10/18/18 1959 10/18/18 2256 10/19/18 0818 10/19/18 1127  BP:  116/70 118/80   Pulse: 80 (!) 103 96   Resp:  18    Temp:  99.6 F (37.6 C) 98.9 F (37.2 C)   TempSrc:  Oral Oral   SpO2:  98% 95%   Height:    6\' 4"  (1.93 m)    Intake/Output Summary (Last 24 hours) at 10/19/2018 1316 Last data filed at 10/19/2018 1117 Gross per 24 hour  Intake 542.38 ml  Output 775 ml  Net -232.62 ml   Physical Exam: A&Ox3, NAD CV: RRR Pulmonary: CTA Bilaterally Abdomen: Soft, Nontender, Nondistended Vascular:  Left Lower Extremity: thigh soft, calf soft. Extremity relitively warm however does becomes slightly cooler as you move distally down the leg. OR dressing by podiatry in place. I will not remove this.    Laboratory: CBC    Component Value Date/Time   WBC 10.7 (H) 10/18/2018 0534   HGB 13.0 10/18/2018 0534   HGB 14.1 11/17/2013 0621   HCT 38.5 (L) 10/18/2018 0534   HCT 40.1 11/17/2013 0621   PLT 284 10/18/2018 0534   PLT 218 11/17/2013 0621   BMET    Component Value Date/Time   NA 135 10/18/2018 0534   NA 135 (L) 11/17/2013 0621   K 4.0 10/18/2018 0534   K 4.3 11/17/2013 0621   CL 101 10/18/2018 0534   CL 99 11/17/2013 0621   CO2 28 10/18/2018 0534   CO2 31 11/17/2013 0621   GLUCOSE 193 (H) 10/18/2018 0534   GLUCOSE 172 (H) 11/17/2013 0621   BUN 11 10/18/2018 0534   BUN 10 11/17/2013 0621   CREATININE 0.73  10/18/2018 0534   CREATININE 0.85 11/17/2013 0621   CALCIUM 9.2 10/18/2018 0534   CALCIUM 9.6 11/17/2013 0621   GFRNONAA >60 10/18/2018 0534   GFRNONAA >60 11/17/2013 0621   GFRAA >60 10/18/2018 0534   GFRAA >60 11/17/2013 0621   Assessment/Planning: The patient is a 63 year old male with a known history of PAD, most recent LLE intervention 10/01/18, now s/p left TMA by podiatry. 1) See above 2) Will plan on left lower extremity angiogram with possible intervention with Dr. Wyn Quakerew today is schedule permitting. If unable to complete will place patient on schedule for tomorrow.  3) Procedure, risks and benefits explained to patient. All questions answered. He wishes to proceed.  4) Will pre-op.  Discussed with Dr. Wallis Martew    PA-C 10/19/2018 1:16 PM

## 2018-10-19 NOTE — Discharge Summary (Signed)
[facility] [place] at [facility]   PATIENT NAME: Austin [name]    MR#:  [id number]  DATE OF BIRTH:  January 08, 1956  DATE OF ADMISSION:  09/29/2018 ADMITTING PHYSICIAN: Adrian Saran, MD  DATE OF DISCHARGE: 10/04/2018  3:00 PM  PRIMARY CARE PHYSICIAN: Revelo, Presley Raddle, MD   ADMISSION DIAGNOSIS:  Skin ulcer Lt foot  DISCHARGE DIAGNOSIS:  Active Problems:   Foot ulcer (HCC)   Malnutrition of moderate degree   SECONDARY DIAGNOSIS:   Past Medical History:  Diagnosis Date  . Diabetes (HCC)   . DVT (deep venous thrombosis) (HCC)      ADMITTING HISTORY  HISTORY OF PRESENT ILLNESS:  Austin [name]  is a 63 y.o. male with a known history of insulin requiring diabetes mellitus, history of DVT on Eliquis, Plavix and sent over to the hospital as a direct admission for worsening of the left foot wound with necrosis and gangrenous changes.  Patient is reporting of left foot pain and asking for pain medication during my examination.  Denies any chest pain or shortness of breath.  Patient is scheduled for wound debridement tomorrow by Dr. Beverly Sessions COURSE:   Bilateral lower extremity cellulitis with worsening of the left foot wound with gangrenous changes Podiatry consult placed to Dr.Cline status post left foot debridement 09/30/2018 Wound cultures with multiple organisms.-patient was on IV vancomycin, cefepime in the hospital.  Changed to oral antibiotics at discharge. Blood cultures NGTD Dressing being changed by podiatry Pain meds as needed Satisfactory improvement of the foot wound per podiatry day of discharge.  #Peripheral arterial disease Patient is status post prior iliac, SFA and popliteal stenting and tibial angioplasty in May 2019 10/01/18-Percutaneous transluminal angioplasty of the entire left SFA and popliteal artery with two drug-coated angioplasty balloons Percutaneous transluminal angioplasty of the left tibioperoneal trunk in the  proximal to mid posterior tibial artery with angioplasty balloon and then the tibioperoneal trunk and proximal posterior tibial artery with [name] drug-coated angioplasty balloon;Viabahn stent placement x3 to the left SFA and popliteal arteries for residual stenosis after angioplasty.  Resumed aspirin and Plavix  #Insulin requiring diabetes mellitus Resumed Lantus and provide sliding scale insulin with diabetic diet  #History of DVT - On Eliquis  Stable for discharge home to follow-up with vascular surgery and podiatry as outpatient  CONSULTS OBTAINED:  Treatment Team:  Linus Galas, DPM  DRUG ALLERGIES:  No Known Allergies  DISCHARGE MEDICATIONS:   Allergies as of 10/04/2018   No Known Allergies     Medication List    STOP taking these medications   cephALEXin 500 MG capsule Commonly known as:  KEFLEX   lisinopril 10 MG tablet Commonly known as:  PRINIVIL,ZESTRIL   oxyCODONE 5 MG immediate release tablet Commonly known as:  Oxy IR/ROXICODONE     TAKE these medications   amoxicillin-clavulanate 875-125 MG tablet Commonly known as:  AUGMENTIN Take 1 tablet by mouth 2 (two) times daily. What changed:  when to take this   apixaban 5 MG Tabs tablet Commonly known as:  ELIQUIS Take 1 tablet (5 mg total) by mouth 2 (two) times daily.   atorvastatin 80 MG tablet Commonly known as:  LIPITOR Take 1 tablet (80 mg total) by mouth daily at 6 PM.   clopidogrel 75 MG tablet Commonly known as:  PLAVIX Take 1 tablet (75 mg total) by mouth daily.   docusate sodium 100 MG capsule Commonly known as:  COLACE Take 1 capsule (100 mg total) by mouth 2 (  two) times daily.   doxycycline 100 MG capsule Commonly known as:  VIBRAMYCIN Take 1 capsule (100 mg total) by mouth 2 (two) times daily.   insulin glargine 100 UNIT/ML injection Commonly known as:  LANTUS Inject 0.15 mLs (15 Units total) into the skin daily.   nicotine 21 mg/24hr patch Commonly known as:  NICODERM CQ -  dosed in mg/24 hours Place 1 patch (21 mg total) onto the skin daily.   polyethylene glycol packet Commonly known as:  MIRALAX Take 17 g by mouth daily.     ASK your doctor about these medications   oxyCODONE 5 MG immediate release tablet Commonly known as:  Oxy IR/ROXICODONE Take 1 tablet (5 mg total) by mouth every 6 (six) hours as needed for up to 5 days for severe pain. Ask about: Should I take this medication?       Today   VITAL SIGNS:  Blood pressure 130/71, pulse (!) 115, temperature 98.1 F (36.7 C), temperature source Oral, resp. rate 18, height 6\' 4"  (1.93 m), weight 84.1 kg, SpO2 98 %.  I/O:  No intake or output data in the 24 hours ending 10/19/18 1446  PHYSICAL EXAMINATION:  Physical Exam  GENERAL:  63 y.o.-year-old patient lying in the bed with no acute distress.  LUNGS: Normal breath sounds bilaterally, no wheezing, rales,rhonchi or crepitation. No use of accessory muscles of respiration.  CARDIOVASCULAR: S1, S2 normal. No murmurs, rubs, or gallops.  ABDOMEN: Soft, non-tender, non-distended. Bowel sounds present. No organomegaly or mass.  NEUROLOGIC: Moves all 4 extremities. PSYCHIATRIC: The patient is alert and oriented x 3.  SKIN: No obvious rash, lesion, or ulcer.   DATA REVIEW:   CBC Recent Labs  Lab 10/18/18 0534  WBC 10.7*  HGB 13.0  HCT 38.5*  PLT 284    Chemistries  Recent Labs  Lab 10/18/18 0534  NA 135  K 4.0  CL 101  CO2 28  GLUCOSE 193*  BUN 11  CREATININE 0.73  CALCIUM 9.2    Cardiac Enzymes No results for input(s): TROPONINI in the last 168 hours.  Microbiology Results  Results for orders placed or performed during the hospital encounter of 09/29/18  Surgical PCR screen     Status: None   Collection Time: 09/29/18  4:14 PM  Result Value Ref Range Status   MRSA, PCR NEGATIVE NEGATIVE Final   Staphylococcus aureus NEGATIVE NEGATIVE Final    Comment: (NOTE) The Xpert SA Assay (FDA approved for NASAL specimens in  patients 64 years of age and older), is one component of a comprehensive surveillance program. It is not intended to diagnose infection nor to guide or monitor treatment. Performed at [facility], [address] [place], [place] [address]   Aerobic/Anaerobic Culture (surgical/deep wound)     Status: Abnormal   Collection Time: 09/30/18 10:28 AM  Result Value Ref Range Status   Specimen Description   Final    WOUND FOOT LEFT Performed at [facility], [address] [place], [place] [address]    Special Requests   Final    FOOT Performed at [facility], [address] [place], [place] [address]    Gram Stain   Final    RARE WBC PRESENT, PREDOMINANTLY PMN ABUNDANT GRAM POSITIVE COCCI IN PAIRS ABUNDANT GRAM NEGATIVE RODS RARE GRAM POSITIVE RODS Performed at [facility], [address] [place], [place] [address]    Culture (A)  Final    MULTIPLE ORGANISMS PRESENT, NONE  PREDOMINANT MIXED ANAEROBIC FLORA PRESENT.  CALL LAB IF FURTHER IID REQUIRED.    Report Status 10/05/2018 FINAL  Final  CULTURE, BLOOD (ROUTINE X 2) w Reflex to ID Panel     Status: None   Collection Time: 10/02/18  4:18 PM  Result Value Ref Range Status   Specimen Description BLOOD BLOOD RIGHT ARM  Final   Special Requests   Final    BOTTLES DRAWN AEROBIC AND ANAEROBIC Blood Culture results may not be optimal due to an excessive volume of blood received in culture bottles   Culture   Final    NO GROWTH 5 DAYS Performed at [facility], [address] [place], [place] [address]    Report Status 10/07/2018 FINAL  Final  CULTURE, BLOOD (ROUTINE X 2) w Reflex to ID Panel     Status: None   Collection Time: 10/02/18  4:26 PM  Result Value Ref Range Status   Specimen Description BLOOD BLOOD LEFT ARM  Final   Special Requests   Final    BOTTLES DRAWN AEROBIC AND ANAEROBIC Blood Culture results may not be optimal due to an excessive volume of blood received in culture  bottles   Culture   Final    NO GROWTH 5 DAYS Performed at [facility], [address] [place], [place] [address]    Report Status 10/07/2018 FINAL  Final    RADIOLOGY:  [place] Ekg Site Rite  Result Date: 10/19/2018 If Site Rite image not attached, placement could not be confirmed due to current cardiac rhythm.   Follow up with PCP in 1 week.  Management plans discussed with the patient, family and they are in agreement.  CODE STATUS:  Code Status History    Date Active Date Inactive Code Status Order ID Comments User Context   09/29/2018 1254 10/04/2018 1812 Full Code [id number]  Ramonita LabGouru, Aruna, MD Inpatient   02/09/2018 1622 02/12/2018 2126 Full Code [id number]  Houston SirenSainani, Vivek J, MD Inpatient      TOTAL TIME TAKING CARE OF THIS PATIENT ON DAY OF DISCHARGE: more than 30 minutes.   Molinda BailiffSrikar R Tyheem Boughner M.D on 10/19/2018 at 2:46 PM  Between 7am to 6pm - Pager - [phone]  After 6pm go to [email or web address] - password [email or web address] [email or web address]  [facility] [place] [facility]  Office  [phone]  CC: Primary care physician; [name], [name], MD  Note: This dictation was prepared with Dragon dictation along with smaller phrase technology. Any transcriptional errors that result from this process are unintentional.

## 2018-10-19 NOTE — Progress Notes (Signed)
[facility] [place] at [facility]   PATIENT NAME: Austin [name]    MR#:  [id number]  DATE OF BIRTH:  08/03/1956  SUBJECTIVE:  CHIEF COMPLAINT:  Came with worsening wound on foot.    Patient is status post surgery no complaints.  No fevers.  Reevaluated by vascular surgery and scheduled for angiogram with intervention today.  REVIEW OF SYSTEMS:  CONSTITUTIONAL: No fever, fatigue or weakness.  EYES: No blurred or double vision.  EARS, NOSE, AND THROAT: No tinnitus or ear pain.  RESPIRATORY: No cough, shortness of breath, wheezing or hemoptysis.  CARDIOVASCULAR: No chest pain, orthopnea, edema.  GASTROINTESTINAL: No nausea, vomiting, diarrhea or abdominal pain.  GENITOURINARY: No dysuria, hematuria.  ENDOCRINE: No polyuria, nocturia,  HEMATOLOGY: No anemia, easy bruising or bleeding SKIN: No rash or lesion. MUSCULOSKELETAL: No joint pain or arthritis.   NEUROLOGIC: No tingling, numbness, weakness.  PSYCHIATRY: No anxiety or depression.     DRUG ALLERGIES:  No Known Allergies  VITALS:  Blood pressure 118/79, pulse (!) 102, temperature 98.3 F (36.8 C), temperature source Oral, resp. rate 16, height 6\' 4"  (1.93 m), weight 90.7 kg, SpO2 96 %.  PHYSICAL EXAMINATION:  GENERAL:  63 y.o.-year-old patient lying in the bed with no acute distress.  EYES: Pupils equal, round, reactive to light and accommodation. No scleral icterus. Extraocular muscles intact.  HEENT: Head atraumatic, normocephalic. Oropharynx and nasopharynx clear.  NECK:  Supple, no jugular venous distention. No thyroid enlargement, no tenderness.  LUNGS: Normal breath sounds bilaterally, no wheezing, rales,rhonchi or crepitation. No use of accessory muscles of respiration.  CARDIOVASCULAR: S1, S2 normal. No murmurs, rubs, or gallops.  ABDOMEN: Soft, nontender, nondistended. Bowel sounds present. No organomegaly or mass.  EXTREMITIES: Dressing in place to site of left foot amputation.    NEUROLOGIC:  Cranial nerves II through XII are intact. Muscle strength 4/5 in all extremities. Sensation intact. Gait not checked.  PSYCHIATRIC: The patient is alert and oriented x 3.  SKIN: No obvious rash, lesion, or ulcer.    LABORATORY PANEL:   CBC Recent Labs  Lab 10/18/18 0534  WBC 10.7*  HGB 13.0  HCT 38.5*  PLT 284   ------------------------------------------------------------------------------------------------------------------  Chemistries  Recent Labs  Lab 10/18/18 0534  NA 135  K 4.0  CL 101  CO2 28  GLUCOSE 193*  BUN 11  CREATININE 0.73  CALCIUM 9.2   ------------------------------------------------------------------------------------------------------------------  Cardiac Enzymes No results for input(s): TROPONINI in the last 168 hours. ------------------------------------------------------------------------------------------------------------------  RADIOLOGY:  [place] Ekg Site Rite  Result Date: 10/19/2018 If Site Rite image not attached, placement could not be confirmed due to current cardiac rhythm.   ASSESSMENT AND PLAN:   Active Problems:   Diabetic foot infection (HCC)  1. 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  S/p left lower extremity angiogram 12/26. 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 - On empiric IV antibiotics with IV Zosyn.  Infectious disease specialist recommends additional 3 weeks of IV antibiotics with Zosyn to complete treatment duration on discharge to skilled nursing facility.. - Pain control Patient evaluated by vascular surgery again today.  Scheduled for angiogram with possible intervention today. Consult for PICC line placement with plans for potential discharge on IV antibiotics to skilled nursing facility in a.m. if angiogram is done today.  2. Uncontrolled type 2 diabetes- recent A1c 8.1%. - Lantus 15 units nightly and SSI  3. History of DVT- unclear when this  occurred To resume Eliquis  after surgical intervention  4. Peripheral vascular disease- s/p angiogram 12/26 by Dr. Wyn Quaker. Plans for repeat angiogram with possible intervention today  5. Hyperlipidemia-stable -Continue home Lipitor  DVT prophylaxis; to resume Eliquis postop once clearance given by vascular surgeon Ordered SCDs  All the records are reviewed and case discussed with Care Management/Social Workerr. Management plans discussed with the patient, family and they are in agreement.  CODE STATUS: Full.  TOTAL TIME TAKING CARE OF THIS PATIENT: 35 minutes.    POSSIBLE D/C IN 1-2 DAYS, DEPENDING ON CLINICAL CONDITION.   Austin [name] M.D on 10/19/2018   Between 7am to 6pm - Pager - [phone]  After 6pm go to [email or web address] - password [email or web address]  [facility] [place] [facility]  Office  [phone]  CC: Primary care physician; Austin Fleeting, MD  Note: This dictation was prepared with Dragon dictation along with smaller phrase technology. Any transcriptional errors that result from this process are unintentional.

## 2018-10-19 NOTE — Plan of Care (Signed)
  Problem: Education: Goal: Knowledge of General Education information will improve Description Including pain rating scale, medication(s)/side effects and non-pharmacologic comfort measures Outcome: Progressing   Problem: Clinical Measurements: Goal: Ability to maintain clinical measurements within normal limits will improve Outcome: Progressing Goal: Will remain free from infection Outcome: Progressing Goal: Diagnostic test results will improve Outcome: Progressing Goal: Respiratory complications will improve Outcome: Progressing Goal: Cardiovascular complication will be avoided Outcome: Progressing   Problem: Activity: Goal: Risk for activity intolerance will decrease Outcome: Progressing   Problem: Nutrition: Goal: Adequate nutrition will be maintained Outcome: Progressing   Problem: Coping: Goal: Level of anxiety will decrease Outcome: Progressing   Problem: Elimination: Goal: Will not experience complications related to urinary retention Outcome: Progressing

## 2018-10-19 NOTE — Progress Notes (Signed)
eLink Physician-Brief Progress Note Patient Name: Austin [name] DOB: 07-29-1956 MRN: [id number]   Date of Service  10/19/2018  HPI/Events of Note  63 yo male with PMH of DM, DM foot ulcer and DVT. 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  S/p left lower extremity angiogram 12/26. 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 On empiric IV antibiotics with IV Zosyn.  Infectious disease specialist recommends additional 3 weeks of IV antibiotics with Zosyn to complete treatment duration on discharge to skilled nursing facility.. Now s/p Arteriogram and catheter directed tPA and mechanical thrombectomy L SFA, popliteal arteries and tibioperoneal trunk. PCCM asked to assume care.  VSS.   eICU Interventions  No new orders.      Intervention Category Evaluation Type: New Patient Evaluation  Lenell Antu 10/19/2018, 8:12 PM

## 2018-10-19 NOTE — Progress Notes (Signed)
Robin in [place] notified this RN pt would be going to CCU. Pt's belongings taken to special recovery, and callback number given to secretary in CCU for report.

## 2018-10-19 NOTE — Progress Notes (Signed)
PICC order noted. Since order placed, R femoral CVC placed. Aleah RN contatced. Stated PICC line may not be necessary. Insructed Aleah to discontinue order for PICC and reorder if still needed in the future.

## 2018-10-19 NOTE — Progress Notes (Signed)
Clinical Child psychotherapist (CSW) sent therapy notes to [name]. Per [facility] admissions coordinator at [facility] authorization is still pending.   [employer], LCSW [phone]

## 2018-10-19 NOTE — Care Management Important Message (Signed)
Important Message  Patient Details  Name: GEFFERY SCULTHORPE MRN: [id number] Date of Birth: 1956/07/01   Medicare Important Message Given:  Yes    Olegario Messier A Hasel Janish 10/19/2018, 11:10 AM

## 2018-10-19 NOTE — H&P (Signed)
[facility] History & Physical Update  The patient was interviewed and re-examined.  [name] previous History and Physical has been reviewed and is unchanged.  There is no change in the plan of care. We plan to proceed with the scheduled procedure.  Festus Barren, MD  10/19/2018, 2:33 PM

## 2018-10-20 ENCOUNTER — Encounter
Admission: AD | Disposition: A | Discharge status: Skilled Nursing Facility | Payer: Self-pay | Source: Home / Self Care | Attending: Internal Medicine

## 2018-10-20 ENCOUNTER — Encounter: Payer: Self-pay | Admitting: Vascular Surgery

## 2018-10-20 DIAGNOSIS — I70262 Atherosclerosis of native arteries of extremities with gangrene, left leg: Secondary | ICD-10-CM

## 2018-10-20 DIAGNOSIS — L97529 Non-pressure chronic ulcer of other part of left foot with unspecified severity: Secondary | ICD-10-CM

## 2018-10-20 DIAGNOSIS — T82868A Thrombosis of vascular prosthetic devices, implants and grafts, initial encounter: Secondary | ICD-10-CM

## 2018-10-20 HISTORY — PX: LOWER EXTREMITY ANGIOGRAPHY: CATH118251

## 2018-10-20 LAB — BASIC METABOLIC PANEL
Anion gap: 7 (ref 5–15)
BUN: 17 mg/dL (ref 8–23)
CALCIUM: 8.6 mg/dL — AB (ref 8.9–10.3)
CO2: 26 mmol/L (ref 22–32)
Chloride: 101 mmol/L (ref 98–111)
Creatinine, Ser: 1.05 mg/dL (ref 0.61–1.24)
GFR calc Af Amer: >60 mL/min (ref >60)
GFR calc non Af Amer: >60 mL/min (ref >60)
Glucose, Bld: 224 mg/dL — ABNORMAL HIGH (ref 70–99)
Potassium: 4.8 mmol/L (ref 3.5–5.1)
SODIUM: 134 mmol/L — AB (ref 135–145)

## 2018-10-20 LAB — CBC
HCT: 33.3 % — ABNORMAL LOW (ref 39.0–52.0)
Hemoglobin: 11 g/dL — ABNORMAL LOW (ref 13.0–17.0)
MCH: 28.7 pg (ref 26.0–34.0)
MCHC: 33 g/dL (ref 30.0–36.0)
MCV: 86.9 fL (ref 80.0–100.0)
Platelets: 225 10*3/uL (ref 150–400)
RBC: 3.83 MIL/uL — ABNORMAL LOW (ref 4.22–5.81)
RDW: 12.4 % (ref 11.5–15.5)
WBC: 13.4 10*3/uL — AB (ref 4.0–10.5)
nRBC: 0 % (ref 0.0–0.2)

## 2018-10-20 LAB — GLUCOSE, CAPILLARY
Glucose-Capillary: 228 mg/dL — ABNORMAL HIGH (ref 70–99)
Glucose-Capillary: 234 mg/dL — ABNORMAL HIGH (ref 70–99)
Glucose-Capillary: 243 mg/dL — ABNORMAL HIGH (ref 70–99)
Glucose-Capillary: 250 mg/dL — ABNORMAL HIGH (ref 70–99)

## 2018-10-20 LAB — HEPARIN LEVEL (UNFRACTIONATED): Heparin Unfractionated: <0.1 IU/mL — ABNORMAL LOW (ref 0.30–0.70)

## 2018-10-20 LAB — SURGICAL PATHOLOGY

## 2018-10-20 LAB — MAGNESIUM: Magnesium: 2.1 mg/dL (ref 1.7–2.4)

## 2018-10-20 LAB — FIBRINOGEN: Fibrinogen: 431 mg/dL (ref 210–475)

## 2018-10-20 SURGERY — LOWER EXTREMITY ANGIOGRAPHY
Anesthesia: Moderate Sedation | Laterality: Left

## 2018-10-20 MED ORDER — NITROGLYCERIN 1 MG/10 ML FOR IR/CATH LAB
INTRA_ARTERIAL | Status: DC | PRN
  Administered 2018-10-20: 250 ug via INTRA_ARTERIAL

## 2018-10-20 MED ORDER — LORAZEPAM 2 MG/ML IJ SOLN: 0.5000 mg | Freq: Once | INTRAMUSCULAR | Status: DC

## 2018-10-20 MED ORDER — TIROFIBAN HCL IN NACL 5-0.9 MG/100ML-% IV SOLN
0.1500 ug/kg/min | INTRAVENOUS | Status: AC
  Administered 2018-10-20 – 2018-10-21 (×3): 0.15 ug/kg/min via INTRAVENOUS
  Filled 2018-10-20 (×6): qty 100

## 2018-10-20 MED ORDER — HEPARIN SODIUM (PORCINE) 1000 UNIT/ML IJ SOLN
INTRAMUSCULAR | Status: AC
  Filled 2018-10-20: qty 1

## 2018-10-20 MED ORDER — SODIUM CHLORIDE (PF) 0.9 % IJ SOLN
INTRAMUSCULAR | Status: AC
  Filled 2018-10-20: qty 50

## 2018-10-20 MED ORDER — APIXABAN 5 MG PO TABS
5.0000 mg | ORAL_TABLET | Freq: Two times a day (BID) | ORAL | Status: DC
  Administered 2018-10-21 – 2018-10-23 (×4): 5 mg via ORAL
  Filled 2018-10-20 (×4): qty 1

## 2018-10-20 MED ORDER — TIROFIBAN (AGGRASTAT) BOLUS VIA INFUSION
25.0000 ug/kg | Freq: Once | INTRAVENOUS | Status: AC
  Administered 2018-10-20: 1982.5 ug via INTRAVENOUS
  Filled 2018-10-20: qty 40

## 2018-10-20 MED ORDER — SODIUM CHLORIDE 0.9 % IV SOLN
INTRAVENOUS | Status: AC | PRN
  Administered 2018-10-20: 500 mL via INTRAVENOUS

## 2018-10-20 MED ORDER — LIDOCAINE-EPINEPHRINE (PF) 1 %-1:200000 IJ SOLN
INTRAMUSCULAR | Status: AC
  Filled 2018-10-20: qty 10

## 2018-10-20 MED ORDER — CEFAZOLIN SODIUM-DEXTROSE 2-4 GM/100ML-% IV SOLN
2.0000 g | Freq: Once | INTRAVENOUS | Status: AC
  Administered 2018-10-20: 2 g via INTRAVENOUS
  Filled 2018-10-20: qty 100

## 2018-10-20 MED ORDER — HEPARIN SODIUM (PORCINE) 1000 UNIT/ML IJ SOLN
INTRAMUSCULAR | Status: DC | PRN
  Administered 2018-10-20: 3000 [IU] via INTRAVENOUS

## 2018-10-20 MED ORDER — FENTANYL CITRATE (PF) 100 MCG/2ML IJ SOLN
INTRAMUSCULAR | Status: DC | PRN
  Administered 2018-10-20: 50 ug via INTRAVENOUS

## 2018-10-20 MED ORDER — MIDAZOLAM HCL 2 MG/2ML IJ SOLN
INTRAMUSCULAR | Status: DC | PRN
  Administered 2018-10-20: 2 mg via INTRAVENOUS

## 2018-10-20 MED ORDER — HEPARIN (PORCINE) IN NACL 1000-0.9 UT/500ML-% IV SOLN
INTRAVENOUS | Status: AC
  Filled 2018-10-20: qty 1000

## 2018-10-20 MED ORDER — MIDAZOLAM HCL 5 MG/5ML IJ SOLN
INTRAMUSCULAR | Status: AC
  Filled 2018-10-20: qty 5

## 2018-10-20 MED ORDER — FENTANYL CITRATE (PF) 100 MCG/2ML IJ SOLN
INTRAMUSCULAR | Status: AC
  Filled 2018-10-20: qty 2

## 2018-10-20 MED ORDER — IOPAMIDOL (ISOVUE-300) INJECTION 61%
INTRAVENOUS | Status: DC | PRN
  Administered 2018-10-20: 65 mL via INTRA_ARTERIAL

## 2018-10-20 SURGICAL SUPPLY — 18 items
BALLN LUTONIX  018 4X60X130 (BALLOONS) ×2
BALLN LUTONIX 018 4X60X130 (BALLOONS) ×1
BALLN ULTRVRSE 3X300X150 (BALLOONS) ×2
BALLN ULTRVRSE 3X300X150 OTW (BALLOONS) ×1
BALLOON LUTONIX 018 4X60X130 (BALLOONS) IMPLANT
BALLOON ULTRVRSE 3X300X150 OTW (BALLOONS) IMPLANT
CANISTER PENUMBRA ENGINE (MISCELLANEOUS) ×2 IMPLANT
CATH BEACON 5 .038 100 VERT TP (CATHETERS) ×2 IMPLANT
CATH CXI SUPP ANG 4FR 135 (CATHETERS) IMPLANT
CATH CXI SUPP ANG 4FR 135CM (CATHETERS) ×3
CATH INDIGO CAT6 KIT (CATHETERS) ×2 IMPLANT
DEVICE PRESTO INFLATION (MISCELLANEOUS) ×2 IMPLANT
DEVICE STARCLOSE SE CLOSURE (Vascular Products) ×2 IMPLANT
PACK ANGIOGRAPHY (CUSTOM PROCEDURE TRAY) ×3 IMPLANT
STENT VIABAHN 5X7.5X120 (Permanent Stent) ×2 IMPLANT
STENT VIABAHN 6X150X120 (Permanent Stent) ×2 IMPLANT
WIRE G V18X300CM (WIRE) ×2 IMPLANT
WIRE J 3MM .035X145CM (WIRE) ×2 IMPLANT

## 2018-10-20 NOTE — Progress Notes (Signed)
Pharmacy Antibiotic Note  Austin [name] is a 63 y.o. male admitted on 10/14/2018 with Wound Infection.  Pharmacy has been consulted for vancomycin and pip/tazo dosing.  Plan: Zosyn 3.375g IV q8h (4 hour infusion).  Height: 6\' 4"  (193 cm) Weight: 174 lb 13.2 oz (79.3 kg) IBW/kg (Calculated) : 86.8  Temp (24hrs), Avg:98.7 F (37.1 C), Min:98.2 F (36.8 C), Max:99 F (37.2 C)  Recent Labs  Lab 10/14/18 1750 10/15/18 0441 10/17/18 0509 10/18/18 0534 10/19/18 2008 10/19/18 2248 10/20/18 0515  WBC 9.8 9.0  --  10.7* 13.6* 14.4* 13.4*  CREATININE 0.79 0.97 0.79 0.73  --   --  1.05    Estimated Creatinine Clearance: 80.8 mL/min (by C-G formula based on SCr of 1.05 mg/dL).    No Known Allergies  Antimicrobials this admission: Zosyn 1/8  >>  Vanocmycin 1/8 >> 1/12  Clindamycin 1/13 x 1  Cefazolin 1/14 x 1   Dose adjustments this admission: none  Microbiology results: 1/9 MRSA PCR: negative  1/10 Left foot: pseudomonas aeruginosa/ proteus vulgaris   Thank you for allowing pharmacy to be a part of this patient's care.  Simpson,Michael L 10/20/2018 7:54 PM

## 2018-10-20 NOTE — Op Note (Signed)
[facility]  Percutaneous Study/Intervention Procedural Note   Date of Surgery: 10/20/2018  Surgeon(s):Zola Runion    Assistants:none  Pre-operative Diagnosis: PAD with ulceration left lower extremity, status post overnight thrombolytic therapy  Post-operative diagnosis:  Same  Procedure(s) Performed:             1.   Left lower extremity angiogram             2.  Catheter placement into left posterior tibial artery from right femoral approach             3.   Mechanical thrombectomy to the left SFA, popliteal artery, tibioperoneal trunk, and posterior tibial arteries with the penumbra cat 6 device             4.  Percutaneous transluminal angioplasty of posterior tibial artery and tibioperoneal trunk with 3 mm diameter by 30 cm length angioplasty balloon             5.   Viabahn covered stent placement to the left popliteal artery with 6 mm diameter by 15 cm length stent for thrombus and stenosis in the left popliteal artery in the distal portion of the previous stents and just below the previous stents  6.  Viabahn covered stent placement to the left tibioperoneal trunk and distal popliteal artery with a 5 mm diameter by 7.5 cm length stent for residual thrombus and stenosis after angioplasty             7.  StarClose closure device right femoral artery  EBL: 200 cc  Contrast: 65 cc  Fluoro Time: 6.9 minutes  Moderate Conscious Sedation Time: approximately 40 minutes using 2 mg of Versed and 50 Mcg of Fentanyl              Indications:  Patient is a 63 y.o.male with gangrene of left foot status post transmetatarsal amputations with continued ischemia.  He has been running on TPA overnight. The patient is brought in for angiography for further evaluation and potential treatment.  Due to the limb threatening nature of the situation, angiogram was performed for attempted limb salvage. The patient is aware that if the procedure fails, amputation would be expected.   The patient also understands that even with successful revascularization, amputation may still be required due to the severity of the situation.  Risks and benefits are discussed and informed consent is obtained.   Procedure:  The patient was identified and appropriate procedural time out was performed.  The patient was then placed supine on the table and prepped and draped in the usual sterile fashion. Moderate conscious sedation was administered during a face to face encounter with the patient throughout the procedure with my supervision of the RN administering medicines and monitoring the patient's vital signs, pulse oximetry, telemetry and mental status throughout from the start of the procedure until the patient was taken to the recovery room.  The existing thrombolytic catheter was removed and a V 18 wire was taken down into the posterior tibial artery.  Selective left lower extremity angiogram was then performed. This demonstrated continued thrombosis of the left SFA and popliteal stents with essentially no distal reconstitution initially. I then advanced a penumbra cat 6 catheter down and performed multiple passes mechanical thrombectomy in the left SFA, popliteal artery, tibioperoneal trunk, and posterior tibial arteries.  After multiple passes, I was able to clear much of the SFA but the distal portion of the stents in the popliteal artery still had  residual thrombus and there was a high-grade stenosis in the popliteal artery and tibioperoneal trunk of greater than 90% in several locations below the stents.  The posterior tibial artery did reconstitute distally.  Selective imaging within the posterior tibial artery showed flow into the foot.  No other vessels had any runoff.  Angioplasty was then performed with a 3 mm diameter by 30 cm length angioplasty balloon in the posterior tibial artery and tibioperoneal trunk inflated to 8 atm for 1 minute.  Imaging was performed which showed restored patency of  the posterior tibial artery with less than 20% residual stenosis, but there remained thrombus and stenosis in the tibioperoneal trunk as well as within the popliteal artery.  There was some somewhat mobile thrombus that appeared in the popliteal stent and the stenosis just below the previously placed popliteal stents.  I elected to cover this area with a 6 mm diameter by 15 cm length via bond stent and only post dilate the distal portion in the popliteal artery with a 4 mm balloon.  Imaging now showed less than 20% residual stenosis in the SFA and popliteal arteries, but the tibioperoneal trunk had thrombus and stenosis of greater than 80%.  We had already ballooned this area.  I used a 5 mm diameter by 7.5 cm length Viabahn stent and extended this stent from the previously placed stent with about a centimeter of overlap down to just above the tibioperoneal trunk bifurcation into the occluded peroneal artery and posterior tibial artery I did not post dilate the stent because there was only about a 20% residual stenosis.  Completion imaging showed patency with some spasm in the posterior tibial artery but there was now continuous flow to the foot. I elected to terminate the procedure.  He will be placed on Aggrastat drip overnight.  The sheath was removed and StarClose closure device was deployed in the right femoral artery with excellent hemostatic result. The patient was taken to the recovery room in stable condition having tolerated the procedure well.  Findings:                        Left Lower Extremity:  Continued thrombosis of the left SFA and popliteal stents with essentially no distal reconstitution initially.   Disposition: Patient was taken to the recovery room in stable condition having tolerated the procedure well.  Complications: None  Leotis Pain 64/14/2020 8:47 AM   This note was created with Dragon Medical transcription system. Any errors in dictation are purely unintentional.

## 2018-10-20 NOTE — Progress Notes (Signed)
PT Cancellation Note  Patient Details Name: ABDULHADI HAMILL MRN: [id number] DOB: 1955-12-26   Cancelled Treatment:    Reason Eval/Treat Not Completed: Medical issues which prohibited therapy;Patient's level of consciousness Pt apparently very confused (with some agitation last night, withdrawl?).  He is on bed rest post angiogram until 10PM, on presedex now in CCU, nursing reports that she had just redressed wound.  Spoke with hospitalist who agrees with not pushing pt this date.  Will try back when he is more appropriate.   Malachi Pro, DPT 10/20/2018, 4:24 PM

## 2018-10-20 NOTE — Progress Notes (Signed)
[facility] [place] at [facility]   PATIENT NAME: Austin [name]    MR#:  [id number]  DATE OF BIRTH:  March 23, 1956  SUBJECTIVE:  CHIEF COMPLAINT:  Came with worsening wound on foot.  Patient is status post surgery by podiatrist recently. Patient evaluated by vascular surgery and taken for angiogram yesterday..  Patient had overnight thrombolytic therapy.  Patient reported to have become very confused overnight and had to be transferred to the ICU and started on Precedex.  He was concern for possible withdrawal  REVIEW OF SYSTEMS:  CONSTITUTIONAL: No fever, fatigue or weakness.  EYES: No blurred or double vision.  EARS, NOSE, AND THROAT: No tinnitus or ear pain.  RESPIRATORY: No cough, shortness of breath, wheezing or hemoptysis.  CARDIOVASCULAR: No chest pain, orthopnea, edema.  GASTROINTESTINAL: No nausea, vomiting, diarrhea or abdominal pain.  GENITOURINARY: No dysuria, hematuria.  ENDOCRINE: No polyuria, nocturia,  HEMATOLOGY: No anemia, easy bruising or bleeding SKIN: No rash or lesion. MUSCULOSKELETAL: No joint pain or arthritis.   NEUROLOGIC: No tingling, numbness, weakness.  PSYCHIATRY: No anxiety or depression.     DRUG ALLERGIES:  No Known Allergies  VITALS:  Blood pressure 91/64, pulse 77, temperature 99 F (37.2 C), temperature source Axillary, resp. rate 18, height 6\' 4"  (1.93 m), weight 79.3 kg, SpO2 100 %.  PHYSICAL EXAMINATION:  GENERAL:  63 y.o.-year-old patient lying in the bed with no acute distress.  EYES: Pupils equal, round, reactive to light and accommodation. No scleral icterus. Extraocular muscles intact.  HEENT: Head atraumatic, normocephalic. Oropharynx and nasopharynx clear.  NECK:  Supple, no jugular venous distention. No thyroid enlargement, no tenderness.  LUNGS: Normal breath sounds bilaterally, no wheezing, rales,rhonchi or crepitation. No use of accessory muscles of respiration.  CARDIOVASCULAR: S1, S2 normal. No murmurs, rubs,  or gallops.  ABDOMEN: Soft, nontender, nondistended. Bowel sounds present. No organomegaly or mass.  EXTREMITIES: Dressing in place to site of left foot amputation.    NEUROLOGIC: Cranial nerves II through XII are intact. Muscle strength 4/5 in all extremities. Sensation intact. Gait not checked.  PSYCHIATRIC: The patient is alert and oriented x 3.  SKIN: No obvious rash, lesion, or ulcer.    LABORATORY PANEL:   CBC Recent Labs  Lab 10/20/18 0515  WBC 13.4*  HGB 11.0*  HCT 33.3*  PLT 225   ------------------------------------------------------------------------------------------------------------------  Chemistries  Recent Labs  Lab 10/20/18 0515  NA 134*  K 4.8  CL 101  CO2 26  GLUCOSE 224*  BUN 17  CREATININE 1.05  CALCIUM 8.6*  MG 2.1   ------------------------------------------------------------------------------------------------------------------  Cardiac Enzymes No results for input(s): TROPONINI in the last 168 hours. ------------------------------------------------------------------------------------------------------------------  RADIOLOGY:  [place] Ekg Site Rite  Result Date: 10/19/2018 If Site Rite image not attached, placement could not be confirmed due to current cardiac rhythm.   ASSESSMENT AND PLAN:   Active Problems:   Diabetic foot infection (HCC)  1. 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  S/p left lower extremity angiogram 12/26. 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 - On empiric IV antibiotics with IV Zosyn.  Infectious disease specialist recommends additional 3 weeks of IV antibiotics with Zosyn to complete treatment duration on discharge to skilled nursing facility.. - Pain control Patient evaluated by vascular surgery. Now s/p Arteriogram and catheter directed tPA and mechanical thrombectomy L SFA, popliteal arteries and tibioperoneal trunk. Patient reported to have had  thrombolytic therapy overnight. Consult for PICC line placement recently for  long-term IV antibiotics on discharge from the hospital  2. Uncontrolled type 2 diabetes- recent A1c 8.1%. - Lantus 15 units nightly and SSI  3. History of DVT- unclear when this occurred To resume Eliquis once cleared by surgeon given recent thrombolytic therapy  4. Peripheral vascular disease- s/p angiogram 12/26 by Dr. Wyn Quaker.  Now s/p Arteriogram and catheter directed tPA and mechanical thrombectomy L SFA, popliteal arteries and tibioperoneal trunk. Patient reported to have had thrombolytic therapy overnight.  5. Hyperlipidemia-stable -Continue home Lipitor  6.  Acute metabolic encephalopathy. Patient became very confused and agitated last night.  Had to be placed on Precedex in the ICU. There is concern patient might be withdrawing from something.  Being managed by critical care team at this time. Documented to have no history of alcohol use on admission.  Patient currently sedated and unable to confirm.  DVT prophylaxis; to resume Eliquis postop once clearance given by vascular surgeon Ordered SCDs  CODE STATUS: Full.  TOTAL TIME TAKING CARE OF THIS PATIENT: 33 minutes.    POSSIBLE D/C IN 1-2 DAYS, DEPENDING ON CLINICAL CONDITION.   Warren Kugelman M.D on 10/20/2018   Between 7am to 6pm - Pager - [phone]  After 6pm go to [email or web address] - password [email or web address]  [facility]  Office  [phone]  CC: Primary care physician; Preston Fleeting, MD  Note: This dictation was prepared with Dragon dictation along with smaller phrase technology. Any transcriptional errors that result from this process are unintentional.

## 2018-10-20 NOTE — Progress Notes (Signed)
Pt placed on Precedex gtt overnight d/t agitation and pulling at lines and groin sites. Pt transported to "speciality area" at [phone] for procedure.

## 2018-10-20 NOTE — Progress Notes (Signed)
Clinical Child psychotherapist (CSW) made [employer] [facility] [name] aware that patient transferred to the ICU.   [employer], LCSW [phone]

## 2018-10-21 LAB — CBC
HCT: 27.8 % — ABNORMAL LOW (ref 39.0–52.0)
Hemoglobin: 9.2 g/dL — ABNORMAL LOW (ref 13.0–17.0)
MCH: 28.6 pg (ref 26.0–34.0)
MCHC: 33.1 g/dL (ref 30.0–36.0)
MCV: 86.3 fL (ref 80.0–100.0)
Platelets: 194 10*3/uL (ref 150–400)
RBC: 3.22 MIL/uL — AB (ref 4.22–5.81)
RDW: 12.4 % (ref 11.5–15.5)
WBC: 15.7 10*3/uL — ABNORMAL HIGH (ref 4.0–10.5)
nRBC: 0 % (ref 0.0–0.2)

## 2018-10-21 LAB — BASIC METABOLIC PANEL
ANION GAP: 8 (ref 5–15)
BUN: 21 mg/dL (ref 8–23)
CO2: 23 mmol/L (ref 22–32)
Calcium: 8.5 mg/dL — ABNORMAL LOW (ref 8.9–10.3)
Chloride: 104 mmol/L (ref 98–111)
Creatinine, Ser: 0.94 mg/dL (ref 0.61–1.24)
GFR calc Af Amer: >60 mL/min (ref >60)
GFR calc non Af Amer: >60 mL/min (ref >60)
Glucose, Bld: 275 mg/dL — ABNORMAL HIGH (ref 70–99)
Potassium: 4.6 mmol/L (ref 3.5–5.1)
Sodium: 135 mmol/L (ref 135–145)

## 2018-10-21 LAB — AEROBIC/ANAEROBIC CULTURE W GRAM STAIN (SURGICAL/DEEP WOUND): Gram Stain: NONE SEEN

## 2018-10-21 LAB — MAGNESIUM: Magnesium: 2.1 mg/dL (ref 1.7–2.4)

## 2018-10-21 LAB — GLUCOSE, CAPILLARY
Glucose-Capillary: 245 mg/dL — ABNORMAL HIGH (ref 70–99)
Glucose-Capillary: 242 mg/dL — ABNORMAL HIGH (ref 70–99)
Glucose-Capillary: 273 mg/dL — ABNORMAL HIGH (ref 70–99)
Glucose-Capillary: 220 mg/dL — ABNORMAL HIGH (ref 70–99)

## 2018-10-21 LAB — HEPARIN INDUCED PLATELET AB (HIT ANTIBODY): Heparin Induced Plt Ab: 0.518 OD — ABNORMAL HIGH (ref 0.000–0.400)

## 2018-10-21 MED ORDER — ALPRAZOLAM 0.5 MG PO TABS
0.5000 mg | ORAL_TABLET | Freq: Three times a day (TID) | ORAL | Status: DC
  Administered 2018-10-21 – 2018-10-30 (×24): 0.5 mg via ORAL
  Filled 2018-10-21 (×26): qty 1

## 2018-10-21 NOTE — Progress Notes (Signed)
[facility]  Daily Progress Note   Subjective  - 1 Day Post-Op  Having lots of bowel movements.  Says the foot feels better.  Biggest complaint is having diarrhea  Objective Vitals:   10/21/18 1100 10/21/18 1200 10/21/18 1300 10/21/18 1400  BP: 112/88     Pulse: (!) 48 (!) 28 (!) 127 90  Resp: 12 (!) 25 (!) 23 20  Temp:      TempSrc:      SpO2: 100% 100% 100% 100%  Weight:      Height:        Intake/Output Summary (Last 24 hours) at 10/21/2018 1539 Last data filed at 10/21/2018 1426 Gross per 24 hour  Intake 1271.64 ml  Output 750 ml  Net 521.64 ml    PULM  CTAB CV  tachycardic VASC  Left foot and lower leg warm with good cap refill.  Wound bandaged  Laboratory CBC    Component Value Date/Time   WBC 15.7 (H) 10/21/2018 0424   HGB 9.2 (L) 10/21/2018 0424   HGB 14.1 11/17/2013 0621   HCT 27.8 (L) 10/21/2018 0424   HCT 40.1 11/17/2013 0621   PLT 194 10/21/2018 0424   PLT 218 11/17/2013 0621    BMET    Component Value Date/Time   NA 135 10/21/2018 0424   NA 135 (L) 11/17/2013 0621   K 4.6 10/21/2018 0424   K 4.3 11/17/2013 0621   CL 104 10/21/2018 0424   CL 99 11/17/2013 0621   CO2 23 10/21/2018 0424   CO2 31 11/17/2013 0621   GLUCOSE 275 (H) 10/21/2018 0424   GLUCOSE 172 (H) 11/17/2013 0621   BUN 21 10/21/2018 0424   BUN 10 11/17/2013 0621   CREATININE 0.94 10/21/2018 0424   CREATININE 0.85 11/17/2013 0621   CALCIUM 8.5 (L) 10/21/2018 0424   CALCIUM 9.6 11/17/2013 0621   GFRNONAA >60 10/21/2018 0424   GFRNONAA >60 11/17/2013 0621   GFRAA >60 10/21/2018 0424   GFRAA >60 11/17/2013 0621    Assessment/Planning: POD #1/2 s/p LLE revascularization   Okay to go to the floor  Okay to transition over to oral medications but would use Eliquis or Xarelto as he has had early rethrombosis and has extensive stents.  May still eventually require a femoral to distal bypass or an amputation and risk of limb loss is extremely  high    Festus BarrenJason Dew  10/21/2018, 3:39 PM

## 2018-10-21 NOTE — Progress Notes (Signed)
Pt being transferred to room 202. Report called to Terri, Charity fundraiser. Pt and belongings transferred to room 202 without incident.

## 2018-10-21 NOTE — Progress Notes (Signed)
[facility] - [place] at [facility]   PATIENT NAME: Austin [name]    MR#:  [id number]  DATE OF BIRTH:  1955/10/28  SUBJECTIVE:  CHIEF COMPLAINT:  Came with worsening wound on foot.  Patient is status post surgery by podiatrist recently.  Patient evaluated by vascular surgery and taken for angiogram and overnight thrombolytic therapy recently .Marland Kitchen  Patient subsequently became confused and agitated and had to be transferred to ICU and started on Precedex.  Notified by nursing staff that patient has been weaned off Precedex this morning.  Patient sitting up and having breakfast with no complaints at this time.   REVIEW OF SYSTEMS:  CONSTITUTIONAL: No fever, fatigue or weakness.  EYES: No blurred or double vision.  EARS, NOSE, AND THROAT: No tinnitus or ear pain.  RESPIRATORY: No cough, shortness of breath, wheezing or hemoptysis.  CARDIOVASCULAR: No chest pain, orthopnea, edema.  GASTROINTESTINAL: No nausea, vomiting, diarrhea or abdominal pain.  GENITOURINARY: No dysuria, hematuria.  ENDOCRINE: No polyuria, nocturia,  HEMATOLOGY: No anemia, easy bruising or bleeding SKIN: No rash or lesion. MUSCULOSKELETAL: No joint pain or arthritis.   NEUROLOGIC: No tingling, numbness, weakness.  PSYCHIATRY: No anxiety or depression.     DRUG ALLERGIES:  No Known Allergies  VITALS:  Blood pressure 112/88, pulse 90, temperature 97.7 F (36.5 C), temperature source Axillary, resp. rate 20, height 6\' 4"  (1.93 m), weight 79.3 kg, SpO2 100 %.  PHYSICAL EXAMINATION:  GENERAL:  63 y.o.-year-old patient lying in the bed with no acute distress.  EYES: Pupils equal, round, reactive to light and accommodation. No scleral icterus. Extraocular muscles intact.  HEENT: Head atraumatic, normocephalic. Oropharynx and nasopharynx clear.  NECK:  Supple, no jugular venous distention. No thyroid enlargement, no tenderness.  LUNGS: Normal breath sounds bilaterally, no wheezing, rales,rhonchi or  crepitation. No use of accessory muscles of respiration.  CARDIOVASCULAR: S1, S2 normal. No murmurs, rubs, or gallops.  ABDOMEN: Soft, nontender, nondistended. Bowel sounds present. No organomegaly or mass.  EXTREMITIES: Dressing in place to site of left foot amputation.    NEUROLOGIC: Cranial nerves II through XII are intact. Muscle strength 4/5 in all extremities. Sensation intact. Gait not checked.  PSYCHIATRIC: The patient is alert and oriented x 2.  SKIN: No obvious rash, lesion, or ulcer.    LABORATORY PANEL:   CBC Recent Labs  Lab 10/21/18 0424  WBC 15.7*  HGB 9.2*  HCT 27.8*  PLT 194   ------------------------------------------------------------------------------------------------------------------  Chemistries  Recent Labs  Lab 10/21/18 0424  NA 135  K 4.6  CL 104  CO2 23  GLUCOSE 275*  BUN 21  CREATININE 0.94  CALCIUM 8.5*  MG 2.1   ------------------------------------------------------------------------------------------------------------------  Cardiac Enzymes No results for input(s): TROPONINI in the last 168 hours. ------------------------------------------------------------------------------------------------------------------  RADIOLOGY:  No results found.  ASSESSMENT AND PLAN:   Active Problems:   Diabetic foot infection (HCC)  1. 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  S/p left lower extremity angiogram 12/26. 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 - On empiric IV antibiotics with IV Zosyn.  Infectious disease specialist recommends additional 3 weeks of IV antibiotics with Zosyn to complete treatment duration on discharge to skilled nursing facility.. - Pain control Patient evaluated by vascular surgery. Now s/p Arteriogram and catheter directed tPA and mechanical thrombectomy L SFA, popliteal arteries and tibioperoneal trunk. Patient reported to have had thrombolytic therapy  recently Consult for PICC line placement recently for long-term IV antibiotics on  discharge from the hospital  2. Uncontrolled type 2 diabetes- recent A1c 8.1%. - Lantus 15 units nightly and SSI  3. History of DVT- unclear when this occurred Eliquis already resumed.   4. Peripheral vascular disease- s/p angiogram 12/26 by Dr. Wyn Quakerew.  Now s/p Arteriogram and catheter directed tPA and mechanical thrombectomy L SFA, popliteal arteries and tibioperoneal trunk. Patient reported to have had thrombolytic therapy recently  5. Hyperlipidemia-stable -Continue home Lipitor  6.  Acute metabolic encephalopathy. Patient became very confused and agitated last night.  Had to be placed on Precedex in the ICU. There is concern patient might be withdrawing from something.  Being managed by critical care team at this time. Documented to have no history of alcohol use on admission.  Patient awake and alert this morning.  Patient also denied any history of alcohol or drug use prior to admission. Clinically improving.  DVT prophylaxis; patient on Eliquis already CODE STATUS: Full.  TOTAL TIME TAKING CARE OF THIS PATIENT: 33 minutes.   Disposition; awaiting transfer out of ICU once bed available To follow-up with further recommendations from vascular surgery before discharge from the hospital  Summit Asc LLPJIE,Adriane Guglielmo M.D on 10/21/2018   Between 7am to 6pm - Pager - [phone]   After 6pm go to [email or web address] - password [email or web address]  [facility] [place] [facility]  Office  [phone]  CC: Primary care physician; [name], [name], MD  Note: This dictation was prepared with Dragon dictation along with smaller phrase technology. Any transcriptional errors that result from this process are unintentional.

## 2018-10-21 NOTE — Progress Notes (Signed)
Inpatient Diabetes Program Recommendations  AACE/ADA: New Consensus Statement on Inpatient Glycemic Control (2015)  Target Ranges:  Prepandial:   less than 140 mg/dL      Peak postprandial:   less than 180 mg/dL (1-2 hours)      Critically ill patients:  140 - 180 mg/dL   Results for Austin [name], Austin [name] (MRN [id number]) as of 10/21/2018 09:53  Ref. Range 10/20/2018 11:50 10/20/2018 16:51 10/20/2018 21:31 10/20/2018 23:46  Glucose-Capillary Latest Ref Range: 70 - 99 mg/dL 568 (H)  5 units NOVOLOG  250 (H)  5 units NOVOLOG  243 (H) 234 (H)  2 units NOVOLOG +  15 units LANTUS   Results for Austin [name], Austin [name] (MRN [id number]) as of 10/21/2018 09:53  Ref. Range 10/21/2018 07:23  Glucose-Capillary Latest Ref Range: 70 - 99 mg/dL 749 (H)  5 units NOVOLOG     Admit with: Diabetic foot infection   History: DM  Home DM Meds: Lantus 15 units Daily  Current Orders: Lantus 15 units QHS      Novolog Moderate Correction Scale/ SSI (0-15 units) TID AC + HS      MD- Please consider the following in-hospital insulin adjustments:  1. Increase Lantus to 18 units QHS (20% increase)  2. Start Novolog Meal Coverage: Novolog 5 units TID with meals  (Please add the following Hold Parameters: Hold if pt eats <50% of meal, Hold if pt NPO)     --Will follow patient during hospitalization--  [name] RN, MSN, CDE Diabetes Coordinator Inpatient Glycemic Control Team Team Pager: [phone] (8a-5p)

## 2018-10-21 NOTE — Progress Notes (Signed)
Pharmacy Antibiotic Note  Austin [name] is a 63 y.o. male admitted on 10/14/2018 with Wound Infection.  Pharmacy has been consulted for vancomycin and pip/tazo dosing.  Plan: Continue Zosyn 3.375g IV q8h (4 hour infusion).  Height: 6\' 4"  (193 cm) Weight: 174 lb 13.2 oz (79.3 kg) IBW/kg (Calculated) : 86.8  Temp (24hrs), Avg:98.3 F (36.8 C), Min:97.7 F (36.5 C), Max:98.7 F (37.1 C)  Recent Labs  Lab 10/15/18 0441 10/17/18 0509 10/18/18 0534 10/19/18 2008 10/19/18 2248 10/20/18 0515 10/21/18 0424  WBC 9.0  --  10.7* 13.6* 14.4* 13.4* 15.7*  CREATININE 0.97 0.79 0.73  --   --  1.05 0.94    Estimated Creatinine Clearance: 90.2 mL/min (by C-G formula based on SCr of 0.94 mg/dL).    No Known Allergies  Antimicrobials this admission: Zosyn 1/8  >>  Vanocmycin 1/8 >> 1/12  Clindamycin 1/13 x 1  Cefazolin 1/14 x 1   Dose adjustments this admission: none  Microbiology results: 1/9 MRSA PCR: negative  1/10 Left foot: pseudomonas aeruginosa/ proteus vulgaris   Thank you for allowing pharmacy to be a part of this patient's care.  Gardner Candle, PharmD, BCPS Clinical Pharmacist 10/21/2018 3:11 PM

## 2018-10-21 NOTE — Progress Notes (Signed)
PT Cancellation Note  Patient Details Name: Austin [name] MRN: [id number] DOB: Nov 14, 1955   Cancelled Treatment:    Reason Eval/Treat Not Completed: Other (comment);Patient not medically ready(PT spoke with RN, reported patient on precedex and not currently able to participate with PT. BP also low this AM. PT will follow up as able.)  Olga Coaster PT, DPT 11:20 AM,10/21/18 [phone]

## 2018-10-22 ENCOUNTER — Inpatient Hospital Stay: Payer: Medicare Other

## 2018-10-22 ENCOUNTER — Inpatient Hospital Stay: Payer: Self-pay

## 2018-10-22 LAB — GLUCOSE, CAPILLARY
Glucose-Capillary: 114 mg/dL — ABNORMAL HIGH (ref 70–99)
Glucose-Capillary: 226 mg/dL — ABNORMAL HIGH (ref 70–99)
Glucose-Capillary: 231 mg/dL — ABNORMAL HIGH (ref 70–99)
Glucose-Capillary: 256 mg/dL — ABNORMAL HIGH (ref 70–99)
Glucose-Capillary: 285 mg/dL — ABNORMAL HIGH (ref 70–99)

## 2018-10-22 MED ORDER — OCUVITE-LUTEIN PO CAPS
1.0000 | ORAL_CAPSULE | Freq: Every day | ORAL | Status: DC
  Administered 2018-10-22 – 2018-10-30 (×8): 1 via ORAL
  Filled 2018-10-22 (×9): qty 1

## 2018-10-22 MED ORDER — ENSURE MAX PROTEIN PO LIQD
11.0000 [oz_av] | Freq: Two times a day (BID) | ORAL | Status: DC
  Administered 2018-10-23 – 2018-10-26 (×7): 11 [oz_av] via ORAL
  Administered 2018-10-26: 237 mL via ORAL
  Administered 2018-10-27 – 2018-10-30 (×5): 11 [oz_av] via ORAL
  Filled 2018-10-22 (×18): qty 330

## 2018-10-22 MED ORDER — SODIUM CHLORIDE 0.9% FLUSH: 10.0000 mL | INTRAVENOUS | Status: DC | PRN

## 2018-10-22 NOTE — Progress Notes (Signed)
2 Days Post-Op   Subjective/Chief Complaint: Patient seen.  Still having a lot of pain with his left foot.   Objective: Vital signs in last 24 hours: Temp:  [97.6 F (36.4 C)-99.6 F (37.6 C)] 97.6 F (36.4 C) (01/16 1403) Pulse Rate:  [66-113] 93 (01/16 1403) Resp:  [17-20] 17 (01/16 0506) BP: (106-133)/(42-68) 106/68 (01/16 1403) SpO2:  [63 %-100 %] 99 % (01/16 1403) Last BM Date: 10/15/18  Intake/Output from previous day: 01/15 0701 - 01/16 0700 In: 177.1 [P.O.:120; IV Piggyback:57.1] Out: 1300 [Urine:1300] Intake/Output this shift: Total I/O In: 480 [P.O.:480] Out: 800 [Urine:800]  The dressing on the left foot is saturated with active and dried blood with some malodor.  Upon removal medial and lateral incisions are fairly well coapted with some dehiscence centrally and plantarly along the plantar flap.  Some mild purulence is noted along the central aspect of the incision with no expressible purulence.  Some minimal necrosis of the plantar flap along the more medial aspect.  Lab Results:  Recent Labs    10/20/18 0515 10/21/18 0424  WBC 13.4* 15.7*  HGB 11.0* 9.2*  HCT 33.3* 27.8*  PLT 225 194   BMET Recent Labs    10/20/18 0515 10/21/18 0424  NA 134* 135  K 4.8 4.6  CL 101 104  CO2 26 23  GLUCOSE 224* 275*  BUN 17 21  CREATININE 1.05 0.94  CALCIUM 8.6* 8.5*   PT/INR No results for input(s): LABPROT, INR in the last 72 hours. ABG No results for input(s): PHART, HCO3 in the last 72 hours.  Invalid input(s): PCO2, PO2  Studies/Results: [place] Ekg Site Rite  Result Date: 10/22/2018 If Site Rite image not attached, placement could not be confirmed due to current cardiac rhythm.   Anti-infectives: Anti-infectives (From admission, onward)   Start     Dose/Rate Route Frequency Ordered Stop   10/20/18 1030  ceFAZolin (ANCEF) IVPB 2g/100 mL premix     2 g 200 mL/hr over 30 Minutes Intravenous  Once 10/20/18 1026 10/20/18 1832   10/19/18 1400  clindamycin  (CLEOCIN) IVPB 300 mg     300 mg 100 mL/hr over 30 Minutes Intravenous  Once 10/19/18 1353 10/19/18 1620   10/15/18 1800  vancomycin (VANCOCIN) 1,500 mg in sodium chloride 0.9 % 500 mL IVPB  Status:  Discontinued     1,500 mg 250 mL/hr over 120 Minutes Intravenous Every 12 hours 10/14/18 1918 10/18/18 0909   10/14/18 1800  vancomycin (VANCOCIN) 2,000 mg in sodium chloride 0.9 % 500 mL IVPB     2,000 mg 250 mL/hr over 120 Minutes Intravenous  Once 10/14/18 1712 10/14/18 2020   10/14/18 1800  piperacillin-tazobactam (ZOSYN) IVPB 3.375 g     3.375 g 12.5 mL/hr over 240 Minutes Intravenous Every 8 hours 10/14/18 1712        Assessment/Plan: s/p Procedure(s): LOWER EXTREMITY ANGIOGRAPHY (Left) Assessment: Diabetes with PVD status post transmetatarsal amputation left foot, some dehiscence and continued infection.   Plan: Saline moistened gauze packed within the open wound area centrally followed by a bulky sterile bandage.  Ace wrap applied.  Perform daily wound care with packing of the wound through the weekend.  Patient is still at a significantly high risk for the need for below-knee amputation.  Follow periodically through the weekend.  LOS: 8 days    Ricci Barker 10/22/2018

## 2018-10-22 NOTE — Progress Notes (Signed)
Peripherally Inserted Central Catheter/Midline Placement  The IV Nurse has discussed with the patient and/or persons authorized to consent for the patient, the purpose of this procedure and the potential benefits and risks involved with this procedure.  The benefits include less needle sticks, lab draws from the catheter, and the patient may be discharged home with the catheter. Risks include, but not limited to, infection, bleeding, blood clot (thrombus formation), and puncture of an artery; nerve damage and irregular heartbeat and possibility to perform a PICC exchange if needed/ordered by physician.  Alternatives to this procedure were also discussed.  Bard Power PICC patient education guide, fact sheet on infection prevention and patient information card has been provided to patient /or left at bedside.    PICC/Midline Placement Documentation  PICC Single Lumen 10/22/18 PICC Right Basilic 42 cm 0 cm (Active)  Indication for Insertion or Continuance of Line Home intravenous therapies (PICC only) 10/22/2018  5:50 PM  Exposed Catheter (cm) 0 cm 10/22/2018  5:50 PM  Site Assessment Clean;Dry;Intact 10/22/2018  5:50 PM  Line Status Flushed;Blood return noted;Saline locked 10/22/2018  5:50 PM  Dressing Type Transparent 10/22/2018  5:50 PM  Dressing Status Clean;Dry;Intact;Antimicrobial disc in place 10/22/2018  5:50 PM  Dressing Change Due 10/29/18 10/22/2018  5:50 PM       Austin [name] 10/22/2018, 5:52 PM

## 2018-10-22 NOTE — Progress Notes (Signed)
Initial Nutrition Assessment  DOCUMENTATION CODES:   Non-severe (moderate) malnutrition in context of chronic illness  INTERVENTION:   Ensure Max protein supplement BID, each supplement provides 150kcal and 30g of protein.  Provide Ocuvite daily for wound healing (provides zinc, vitamin A, vitamin C, Vitamin E, copper, and selenium).  Change diet from heart healthy to carbohydrate modified   Bowel regimen per MD  NUTRITION DIAGNOSIS:   Moderate Malnutrition related to chronic illness(uncontrolled IDDM) as evidenced by moderate to severe fat depletions, moderate to severe muscle depletions.  GOAL:   Patient will meet greater than or equal to 90% of their needs  MONITOR:   PO intake, Supplement acceptance, Labs, Weight trends, Skin, I & O's  REASON FOR ASSESSMENT:   LOS    ASSESSMENT:   63 y/o male admitted with IDDM, PVD, DVT admitted with left diabetic foot wound-s/pdebridement and first and second ray amputation on 12/25, left lower extremity angiogram 12/26 and transmetatarsal amputation 10/16/18   Pt known to nutrition department from recent previous admit. Pt generally a good eater while in hospital; pt currently eating 100% of meals. Pt previously drinking Premier Protein; will order Ensure Max as this is replacing Premier Protein on formulary. Will add ocuvite for wound healing. Per chart, pt appears to have lost 10lbs(6%) since last admit; only one weight from this admit documented so unsure if pt has had any true weight loss. Patient reports his UBW is 190 lbs (86.4 kg). Pt weighed ~200lbs in January 2019. Pt with type 1 BM yesterday; recommend bowel regimen per MD.   Medications reviewed and include: insulin, zosyn, morphine, oxycodone    Labs reviewed: wbc- 15.7(H), Hgb 9.2(L), Hct 27.8(L) cbgs- 245, 242, 273, 220, 114 x 24 hrs AIC 8.1(H)- 12/25  NUTRITION - FOCUSED PHYSICAL EXAM:    Most Recent Value  Orbital Region  Moderate depletion  Upper Arm Region   Severe depletion  Thoracic and Lumbar Region  Moderate depletion  Buccal Region  Moderate depletion  Temple Region  Moderate depletion  Clavicle Bone Region  Moderate depletion  Clavicle and Acromion Bone Region  Moderate depletion  Scapular Bone Region  Moderate depletion  Dorsal Hand  Moderate depletion  Patellar Region  Severe depletion  Anterior Thigh Region  Severe depletion  Posterior Calf Region  Severe depletion  Edema (RD Assessment)  None  Hair  Reviewed  Eyes  Reviewed  Mouth  Reviewed  Skin  Reviewed  Nails  Reviewed     Diet Order:   Diet Order            Diet Carb Modified Fluid consistency: Thin; Room service appropriate? Yes  Diet effective now             EDUCATION NEEDS:   Education needs have been addressed  Skin:  Skin Assessment: Reviewed RN Assessment(incision L foot )  Last BM:  1/15- type 1  Height:   Ht Readings from Last 1 Encounters:  10/19/18 6\' 4"  (1.93 m)   Weight:   Wt Readings from Last 1 Encounters:  10/19/18 79.3 kg   Ideal Body Weight:  91.8 kg  BMI:  Body mass index is 21.28 kg/m.  Estimated Nutritional Needs:   Kcal:  2200-2500kcal/day   Protein:  102-118g/day   Fluid:  >2.2L/day   Betsey Holiday MS, RD, LDN Pager #- [phone] Office#- [phone] After Hours Pager: [phone]

## 2018-10-22 NOTE — Progress Notes (Signed)
[facility]  Daily Progress Note   Subjective: 2 Days Post-Op: Left lower extremity angiogram, Catheter placement into left posterior tibial artery from right femoral approach, Mechanical thrombectomy to the left SFA, popliteal artery, tibioperoneal trunk, and posterior tibial arteries with the penumbra cat 6 device, Percutaneous transluminal angioplasty of posterior tibial artery and tibioperoneal trunk with 3 mm diameter by 30 cm length angioplasty balloon, Viabahn covered stent placement to the left popliteal artery with 6 mm diameter by 15 cm length stent for thrombus and stenosis in the left popliteal artery in the distal portion of the previous stents and just below the previous stents, Viabahn covered stent placement to the left tibioperoneal trunk and distal popliteal artery with a 5 mm diameter by 7.5 cm length stent for residual thrombus and stenosis after angioplasty with StarClose closure device right femoral artery.  Patient with minimal left lower extremity discomfort. No issues overnight.  Objective: Vitals:   10/21/18 1800 10/21/18 1838 10/21/18 2124 10/22/18 0506  BP:  (!) 133/42 126/62 112/61  Pulse: 66 (!) 113 100 (!) 106  Resp: 18 20 20 17   Temp:  98.6 F (37 C) 99.6 F (37.6 C) 98.2 F (36.8 C)  TempSrc:  Oral Oral Oral  SpO2: 100% (!) 63% 95% 99%  Weight:      Height:        Intake/Output Summary (Last 24 hours) at 10/22/2018 1015 Last data filed at 10/22/2018 0900 Gross per 24 hour  Intake 417.07 ml  Output 1900 ml  Net -1482.93 ml   Physical Exam: A&Ox3, NAD CV: RRR Pulmonary: CTA Bilaterally Abdomen: Soft, Nontender, Nondistended, (+) Bowel Sounds Vascular:  Left Lower Extremity: Thigh soft, calf soft. Foot edematous but warm.    Laboratory: CBC    Component Value Date/Time   WBC 15.7 (H) 10/21/2018 0424   HGB 9.2 (L) 10/21/2018 0424   HGB 14.1 11/17/2013 0621   HCT 27.8 (L) 10/21/2018 0424   HCT 40.1 11/17/2013 0621   PLT  194 10/21/2018 0424   PLT 218 11/17/2013 0621   BMET    Component Value Date/Time   NA 135 10/21/2018 0424   NA 135 (L) 11/17/2013 0621   K 4.6 10/21/2018 0424   K 4.3 11/17/2013 0621   CL 104 10/21/2018 0424   CL 99 11/17/2013 0621   CO2 23 10/21/2018 0424   CO2 31 11/17/2013 0621   GLUCOSE 275 (H) 10/21/2018 0424   GLUCOSE 172 (H) 11/17/2013 0621   BUN 21 10/21/2018 0424   BUN 10 11/17/2013 0621   CREATININE 0.94 10/21/2018 0424   CREATININE 0.85 11/17/2013 0621   CALCIUM 8.5 (L) 10/21/2018 0424   CALCIUM 9.6 11/17/2013 0621   GFRNONAA >60 10/21/2018 0424   GFRNONAA >60 11/17/2013 0621   GFRAA >60 10/21/2018 0424   GFRAA >60 11/17/2013 5465   Assessment/Planning: The patient is a 63 year old male with PAD s/p LLE angiogram with intervention to revascularize the leg 1) Improvement in physical exam to LLE since intervention 2) Patient on Eliquis 3) Patient to follow up in our office as an outpatient.   Discussed with Dr. Wallis Mart Daxx Tiggs PA-C 10/22/2018 10:15 AM

## 2018-10-22 NOTE — Progress Notes (Signed)
Physical Therapy Treatment Patient Details Name: Austin [name] MRN: [id number] DOB: 11-Apr-1956 Today's Date: 10/22/2018    History of Present Illness 63 y.o. male with a known history of type 2 diabetes and history DVT who was directly admitted from the podiatry office for persistent left lower extremity diabetic foot infection.  He underwent debridement and first and second toe amputations on 09/30/2018.  He has been on antibiotics as an outpatient, but his wound has continued to worsen.  Underwent left lower extremity angiogram on 10/01/2018 with angioplasty and stent placement.  He denies any fevers or chills.  He endorses some mild left foot pain.  No lower extremity edema. He underwent L transmetatarsal amputation 10/16/18.     PT Comments    Pt did poorly functionally today with PT session.  He was able to do a majority of the work to get to EOB, but could not tolerate prolonged sitting with multiple near fall forward loss of balance and inability to keep weight off L LE.  PT was able to do some knee extension (hamstring/calf) stretches and after multiple stretches did achieve knee extension to within about 5 degrees of full (knee "stuck" at 25-30 degrees of flexion on arrival.)  Pt very limited with attempts at standing with max assist he was still unable to get to standing.  Follow Up Recommendations  SNF     Equipment Recommendations  None recommended by PT(continues to need to use walker 2/2 WBing status)    Recommendations for Other Services       Precautions / Restrictions Precautions Precautions: Fall Restrictions LLE Weight Bearing: Non weight bearing    Mobility  Bed Mobility Overal bed mobility: Needs Assistance Bed Mobility: Supine to Sit;Sit to Supine     Supine to sit: Min assist Sit to supine: Mod assist   General bed mobility comments: Pt is able to get to sitting with only light assist but does poorly in sitting with some WBing through L despite cuing as  well as mutliple bouts of nearly falling forward from sitting  Transfers Overall transfer level: Needs assistance Equipment used: Rolling walker (2 wheeled) Transfers: Sit to/from Stand Sit to Stand: Total assist         General transfer comment: Pt did very poorly with 3 attempts at standing, he struggled to fully take weight off L, could not initiate upward movement w/o very heavy max assist, elevated bed height and heavy cuing and we were never close to getting to upright standing  Ambulation/Gait                 Stairs             Wheelchair Mobility    Modified Rankin (Stroke Patients Only)       Balance Overall balance assessment: Needs assistance Sitting-balance support: Bilateral upper extremity supported Sitting balance-Leahy Scale: Poor Sitting balance - Comments: Pt did poorly with sitting balance, consistently putting weight through L heel (and at times forefoot despite nearly constant cuing.)  Pt unable to keep himself upright and had multiple episodes     Standing balance-Leahy Scale: Zero Standing balance comment: Pt unable to to even get close to attaining standing this date                            Cognition Arousal/Alertness: Awake/alert Behavior During Therapy: Impulsive;WFL for tasks assessed/performed Overall Cognitive Status: Within Functional Limits for tasks assessed  Exercises General Exercises - Lower Extremity Ankle Circles/Pumps: AROM;10 reps Heel Slides: AROM;10 reps Hip ABduction/ADduction: AROM;10 reps(assist on L to facilitate knee extension) Straight Leg Raises: AAROM;10 reps    General Comments General comments (skin integrity, edema, etc.): L knee with slight contraction, poorly positioned in this regard on arrival.  Educated on proper positioning and importance of maintaining mobility      Pertinent Vitals/Pain Pain Assessment: (unrated, but  clearly uncomfortable with straightening L knee)    Home Living                      Prior Function            PT Goals (current goals can now be found in the care plan section) Progress towards PT goals: Not progressing toward goals - comment(Pt more limited this PT session than previous)    Frequency    7X/week      PT Plan Current plan remains appropriate    Co-evaluation              AM-PAC PT "6 Clicks" Mobility   Outcome Measure  Help needed turning from your back to your side while in a flat bed without using bedrails?: A Little Help needed moving from lying on your back to sitting on the side of a flat bed without using bedrails?: A Little Help needed moving to and from a bed to a chair (including a wheelchair)?: Total Help needed standing up from a chair using your arms (e.g., wheelchair or bedside chair)?: Total Help needed to walk in hospital room?: Total Help needed climbing 3-5 steps with a railing? : Total 6 Click Score: 10    End of Session Equipment Utilized During Treatment: Gait belt Activity Tolerance: Patient limited by fatigue Patient left: in bed;with call bell/phone within reach;with bed alarm set Nurse Communication: Mobility status PT Visit Diagnosis: Unsteadiness on feet (R26.81);Other abnormalities of gait and mobility (R26.89);Pain;Muscle weakness (generalized) (M62.81) Pain - Right/Left: Left Pain - part of body: Ankle and joints of foot     Time: [id number] PT Time Calculation (min) (ACUTE ONLY): 30 min  Charges:  $Therapeutic Exercise: 8-22 mins $Therapeutic Activity: 8-22 mins                     Malachi ProGalen R Lyanne Kates, DPT 10/22/2018, 1:18 PM

## 2018-10-22 NOTE — Care Management Important Message (Signed)
Copy of signed Medicare IM left with patient in room. 

## 2018-10-22 NOTE — Progress Notes (Signed)
Date of Admission:  10/14/2018      ID: PHINEHAS LEESMAN is a 63 y.o. male :   Diabetic foot infection (HCC) S/p TMA 10/16/18 Left leg    10/20/18 s/p Arteriogram and catheter directed tPA and mechanical thrombectomy L SFA, popliteal arteries and tibioperoneal trunk .posterior tibial arteries   stent placement to the left popliteal artery with 6 mm diameter by 15 cm length stent for thrombus and stenosis in the left popliteal artery in the distal portion of the previous stents and just below the previous stents  stent placement to the left tibioperoneal trunk and distal popliteal artery with a 5 mm diameter by 7.5 cm length stent for residual thrombus and stenosis after angioplasty  Percutaneous transluminal angioplasty of posterior tibial artery and tibioperoneal trunk with 3 mm diameter by 30 cm length angioplasty balloon  Metabolic encephalopathy  Subjective: Pt is in severe pain left leg No fever Still confused  Medications:  . ALPRAZolam  0.5 mg Oral TID  . apixaban  5 mg Oral BID  . atorvastatin  80 mg Oral q1800  . diclofenac sodium  2 g Topical QID  . insulin aspart  0-15 Units Subcutaneous TID WC  . insulin aspart  0-5 Units Subcutaneous QHS  . insulin glargine  15 Units Subcutaneous QHS  . multivitamin-lutein  1 capsule Oral Daily  . nicotine  21 mg Transdermal Daily  . ENSURE MAX PROTEIN  11 oz Oral BID  . sodium chloride flush  3 mL Intravenous Q12H    Objective: Vital signs in last 24 hours: Temp:  [97.6 F (36.4 C)-99.6 F (37.6 C)] 97.6 F (36.4 C) (01/16 1403) Pulse Rate:  [66-113] 93 (01/16 1403) Resp:  [15-20] 17 (01/16 0506) BP: (106-133)/(42-68) 106/68 (01/16 1403) SpO2:  [63 %-100 %] 99 % (01/16 1403)  PHYSICAL EXAM:  General: awake, does respond to questions, confused, , cooperative, in  distress, appears stated age.  Head: Normocephalic, without obvious abnormality, atraumatic. Eyes: Conjunctivae clear, anicteric sclerae. Pupils are equal ENT  Nares normal. No drainage or sinus tenderness. Lips, mucosa, and tongue normal. No Thrush Neck: Supple, symmetrical, no adenopathy, thyroid: non tender no carotid bruit and no JVD. Back: No CVA tenderness. Lungs: Clear to auscultation bilaterally. No Wheezing or Rhonchi. No rales. Heart: Regular rate and rhythm, no murmur, rub or gallop. Abdomen: Soft, non-tender,not distended. Bowel sounds normal. No masses Extremities: left leg dressing not removed Rt femoral line  Skin: No rashes or lesions. Or bruising Lymph: Cervical, supraclavicular normal. Neurologic: Grossly non-focal  Lab Results Recent Labs    10/20/18 0515 10/21/18 0424  WBC 13.4* 15.7*  HGB 11.0* 9.2*  HCT 33.3* 27.8*  NA 134* 135  K 4.8 4.6  CL 101 104  CO2 26 23  BUN 17 21  CREATININE 1.05 0.94   Liver Panel No results for input(s): PROT, ALBUMIN, AST, ALT, ALKPHOS, BILITOT, BILIDIR, IBILI in the last 72 hours. Sedimentation Rate No results for input(s): ESRSEDRATE in the last 72 hours. C-Reactive Protein No results for input(s): CRP in the last 72 hours.  Microbiology:  Studies/Results: No results found.   Assessment/Plan: 63 y.o. male with a history of DM , peripheral vascular disease, chronic left foot infection was admitted with worsening necrotic wound of the left foot on 10/14/18.    Necrotic infection of the left foot at the amputation site now status post transmetatarsal amputation on 10/16/18.  Culture positive for Pseudomonas and Proteus.   .  continue zosyn until  11/06/18    .Peripheral arterial disease: h/o of angioplasty and stents to the left leg.  on 1/14 had extensive procedure including mechanical thrombectomy, stent placement following thrombolysis  On Plavix  Encephalopathy likely due to meds  Diabetes mellitus: Management as per primary team  Please remove rt femoral line as risk for infection   ?Hypertension on no meds now  ___Hyperlipidemia on  atorvastatin  Discussed with his nurse and Dr.Oje

## 2018-10-22 NOTE — Addendum Note (Signed)
Addendum  created 10/22/18 1009 by Stormy Fabian, CRNA   Charge Capture section accepted

## 2018-10-22 NOTE — Progress Notes (Addendum)
[facility] - [place] at [facility]   PATIENT NAME: Austin [name]    MR#:  [id number]  DATE OF BIRTH:  09-19-1956  SUBJECTIVE:  CHIEF COMPLAINT:  Came with worsening wound on foot.  Patient is status post surgery by podiatrist recently.  Patient evaluated by vascular surgery and taken for angiogram and overnight thrombolytic therapy recently .Marland Kitchen  Patient subsequently became confused and agitated and had to be transferred to ICU and started on Precedex.  Patient subsequently weaned off Precedex and transferred out of the ICU back to the general medical floor.   This morning patient complained of significant left foot pain.  Nursing staff just administered medications.  No evidence of confusion or agitation . No diarrhea reported since yesterday.  Patient noted to have been on MiraLAX and Colace which was discontinued.  REVIEW OF SYSTEMS:  CONSTITUTIONAL: No fever, fatigue or weakness.  EYES: No blurred or double vision.  EARS, NOSE, AND THROAT: No tinnitus or ear pain.  RESPIRATORY: No cough, shortness of breath, wheezing or hemoptysis.  CARDIOVASCULAR: No chest pain, orthopnea, edema.  GASTROINTESTINAL: No nausea, vomiting, diarrhea or abdominal pain.  GENITOURINARY: No dysuria, hematuria.  ENDOCRINE: No polyuria, nocturia,  HEMATOLOGY: No anemia, easy bruising or bleeding SKIN: No rash or lesion. MUSCULOSKELETAL: No joint pain or arthritis.   NEUROLOGIC: No tingling, numbness, weakness.  PSYCHIATRY: No anxiety or depression.     DRUG ALLERGIES:  No Known Allergies  VITALS:  Blood pressure 112/61, pulse (!) 106, temperature 98.2 F (36.8 C), temperature source Oral, resp. rate 17, height 6\' 4"  (1.93 m), weight 79.3 kg, SpO2 99 %.  PHYSICAL EXAMINATION:  GENERAL:  63 y.o.-year-old patient lying in the bed with no acute distress.  EYES: Pupils equal, round, reactive to light and accommodation. No scleral icterus.HEENT: Head atraumatic, normocephalic. Oropharynx  and nasopharynx clear.  NECK:  Supple, no jugular venous distention. No thyroid enlargement, no tenderness.  LUNGS: Normal breath sounds bilaterally, no wheezing, rales,rhonchi or crepitation. No use of accessory muscles of respiration.  CARDIOVASCULAR: S1, S2 normal. No murmurs, rubs, or gallops.  ABDOMEN: Soft, nontender, nondistended. Bowel sounds present. No organomegaly or mass.  EXTREMITIES: Dressing in place to site of left foot amputation.    NEUROLOGIC: Cranial nerves II through XII are intact. Muscle strength 4/5 in all extremities. Sensation intact. Gait not checked.  PSYCHIATRIC: The patient is alert and oriented x 2.  SKIN: No obvious rash, lesion, or ulcer.    LABORATORY PANEL:   CBC Recent Labs  Lab 10/21/18 0424  WBC 15.7*  HGB 9.2*  HCT 27.8*  PLT 194   ------------------------------------------------------------------------------------------------------------------  Chemistries  Recent Labs  Lab 10/21/18 0424  NA 135  K 4.6  CL 104  CO2 23  GLUCOSE 275*  BUN 21  CREATININE 0.94  CALCIUM 8.5*  MG 2.1   ------------------------------------------------------------------------------------------------------------------  Cardiac Enzymes No results for input(s): TROPONINI in the last 168 hours. ------------------------------------------------------------------------------------------------------------------  RADIOLOGY:  No results found.  ASSESSMENT AND PLAN:   Active Problems:   Diabetic foot infection (HCC)  1. 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  S/p left lower extremity angiogram 12/26. 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 - On empiric IV antibiotics with IV Zosyn.  Infectious disease specialist recommends additional 3 weeks of IV antibiotics with Zosyn to complete treatment duration on discharge to skilled nursing facility.. - Pain control Patient evaluated by vascular  surgery. Now s/p Arteriogram and catheter directed tPA  and mechanical thrombectomy L SFA, popliteal arteries and tibioperoneal trunk. Patient reported to have had thrombolytic therapy recently PICC line already placed for long-term IV antibiotics x3 weeks of IV Zosyn at skilled nursing facility  2. Uncontrolled type 2 diabetes- recent A1c 8.1%. - Lantus 15 units nightly and SSI  3. History of DVT- unclear when this occurred Eliquis already resumed.   4. Peripheral vascular disease- s/p angiogram 12/26 by Dr. Wyn Quakerew.  Now s/p Arteriogram and catheter directed tPA and mechanical thrombectomy L SFA, popliteal arteries and tibioperoneal trunk. Patient may still eventually require a femoral to distal bypass or an amputation and risk of limb loss is extremely high per documentation from Dr.  Wyn Quakerew on 10/21/2018. Patient reevaluated by vascular surgery service this morning.  Recommendation is to follow-up with vascular surgery office as outpatient.   5. Hyperlipidemia-stable -Continue home Lipitor  6.  Acute metabolic encephalopathy. Patient became very confused and agitated recently and had to be placed on Precedex in the ICU. There is concern patient might be withdrawing from something.  Patient was managed by critical care team.  Already weaned off Precedex and transferred out of ICU.   Documented to have no history of alcohol use on admission.  Patient awake and alert this morning.  Patient also denied any history of alcohol or drug use prior to admission. Clinically improved  DVT prophylaxis; patient on Eliquis already CODE STATUS: Full.  Disposition; to coordinate with social worker regarding discharge to skilled nursing facility tomorrow to complete 3 weeks of IV Zosyn as recommended by infectious disease specialist.  Follow-up with vascular surgeon as outpatient  TOTAL TIME TAKING CARE OF THIS PATIENT: 35 minutes.   Disposition; awaiting transfer out of ICU once bed available To  follow-up with further recommendations from vascular surgery before discharge from the hospital  Dana-Farber Cancer InstituteJIE,Miaa Latterell M.D on 10/22/2018   Between 7am to 6pm - Pager - [phone]   After 6pm go to [email or web address] - password [email or web address]  [facility] [place] [facility]  Office  [phone]  CC: Primary care physician; [name], [name], MD  Note: This dictation was prepared with Dragon dictation along with smaller phrase technology. Any transcriptional errors that result from this process are unintentional.

## 2018-10-23 LAB — CBC
HCT: 25.4 % — ABNORMAL LOW (ref 39.0–52.0)
Hemoglobin: 8.5 g/dL — ABNORMAL LOW (ref 13.0–17.0)
MCH: 29.3 pg (ref 26.0–34.0)
MCHC: 33.5 g/dL (ref 30.0–36.0)
MCV: 87.6 fL (ref 80.0–100.0)
Platelets: 211 10*3/uL (ref 150–400)
RBC: 2.9 MIL/uL — ABNORMAL LOW (ref 4.22–5.81)
RDW: 12.7 % (ref 11.5–15.5)
WBC: 11.2 10*3/uL — ABNORMAL HIGH (ref 4.0–10.5)
nRBC: 0 % (ref 0.0–0.2)

## 2018-10-23 LAB — BASIC METABOLIC PANEL
Anion gap: 4 — ABNORMAL LOW (ref 5–15)
BUN: 10 mg/dL (ref 8–23)
CO2: 29 mmol/L (ref 22–32)
CREATININE: 0.59 mg/dL — AB (ref 0.61–1.24)
Calcium: 8.6 mg/dL — ABNORMAL LOW (ref 8.9–10.3)
Chloride: 103 mmol/L (ref 98–111)
GFR calc Af Amer: >60 mL/min (ref >60)
GFR calc non Af Amer: >60 mL/min (ref >60)
Glucose, Bld: 176 mg/dL — ABNORMAL HIGH (ref 70–99)
Potassium: 3.8 mmol/L (ref 3.5–5.1)
Sodium: 136 mmol/L (ref 135–145)

## 2018-10-23 LAB — GLUCOSE, CAPILLARY
GLUCOSE-CAPILLARY: 262 mg/dL — AB (ref 70–99)
Glucose-Capillary: 174 mg/dL — ABNORMAL HIGH (ref 70–99)
Glucose-Capillary: 211 mg/dL — ABNORMAL HIGH (ref 70–99)
Glucose-Capillary: 211 mg/dL — ABNORMAL HIGH (ref 70–99)
Glucose-Capillary: 187 mg/dL — ABNORMAL HIGH (ref 70–99)

## 2018-10-23 LAB — MAGNESIUM: Magnesium: 2.1 mg/dL (ref 1.7–2.4)

## 2018-10-23 LAB — PROTIME-INR
INR: 1.16
Prothrombin Time: 14.7 seconds (ref 11.4–15.2)

## 2018-10-23 LAB — HEPARIN LEVEL (UNFRACTIONATED): Heparin Unfractionated: 1.34 IU/mL — ABNORMAL HIGH (ref 0.30–0.70)

## 2018-10-23 LAB — APTT: aPTT: 35 seconds (ref 24–36)

## 2018-10-23 MED ORDER — HEPARIN BOLUS VIA INFUSION
4000.0000 [IU] | Freq: Once | INTRAVENOUS | Status: AC
  Administered 2018-10-23: 4000 [IU] via INTRAVENOUS
  Filled 2018-10-23: qty 4000

## 2018-10-23 MED ORDER — HEPARIN (PORCINE) 25000 UT/250ML-% IV SOLN
1650.0000 [IU]/h | INTRAVENOUS | Status: DC
  Administered 2018-10-23: 1100 [IU]/h via INTRAVENOUS
  Administered 2018-10-24: 1350 [IU]/h via INTRAVENOUS
  Administered 2018-10-25: 1500 [IU]/h via INTRAVENOUS
  Administered 2018-10-26 – 2018-10-28 (×4): 1650 [IU]/h via INTRAVENOUS
  Filled 2018-10-23 (×7): qty 250

## 2018-10-23 NOTE — Progress Notes (Signed)
[facility] - [place] at [facility]   PATIENT NAME: Austin [name]    MR#:  [id number]  DATE OF BIRTH:  04/02/1956  SUBJECTIVE:  CHIEF COMPLAINT: Patient doing better denies any complaints currently  REVIEW OF SYSTEMS:  CONSTITUTIONAL: No fever, fatigue or weakness.  EYES: No blurred or double vision.  EARS, NOSE, AND THROAT: No tinnitus or ear pain.  RESPIRATORY: No cough, shortness of breath, wheezing or hemoptysis.  CARDIOVASCULAR: No chest pain, orthopnea, edema.  GASTROINTESTINAL: No nausea, vomiting, diarrhea or abdominal pain.  GENITOURINARY: No dysuria, hematuria.  ENDOCRINE: No polyuria, nocturia,  HEMATOLOGY: No anemia, easy bruising or bleeding SKIN: No rash or lesion. MUSCULOSKELETAL: No joint pain or arthritis.   NEUROLOGIC: No tingling, numbness, weakness.  PSYCHIATRY: No anxiety or depression.     DRUG ALLERGIES:  No Known Allergies  VITALS:  Blood pressure 105/85, pulse (!) 102, temperature 97.7 F (36.5 C), temperature source Axillary, resp. rate 20, height 6\' 4"  (1.93 m), weight 79.3 kg, SpO2 100 %.  PHYSICAL EXAMINATION:  GENERAL:  63 y.o.-year-old patient lying in the bed with no acute distress.  EYES: Pupils equal, round, reactive to light and accommodation. No scleral icterus.HEENT: Head atraumatic, normocephalic. Oropharynx and nasopharynx clear.  NECK:  Supple, no jugular venous distention. No thyroid enlargement, no tenderness.  LUNGS: Normal breath sounds bilaterally, no wheezing, rales,rhonchi or crepitation. No use of accessory muscles of respiration.  CARDIOVASCULAR: S1, S2 normal. No murmurs, rubs, or gallops.  ABDOMEN: Soft, nontender, nondistended. Bowel sounds present. No organomegaly or mass.  EXTREMITIES: Dressing in place to site of left foot amputation.    NEUROLOGIC: Cranial nerves II through XII are intact. Muscle strength 4/5 in all extremities. Sensation intact. Gait not checked.  PSYCHIATRIC: The patient is alert and  oriented x 2.  SKIN: No obvious rash, lesion, or ulcer.    LABORATORY PANEL:   CBC Recent Labs  Lab 10/23/18 0553  WBC 11.2*  HGB 8.5*  HCT 25.4*  PLT 211   ------------------------------------------------------------------------------------------------------------------  Chemistries  Recent Labs  Lab 10/23/18 0553  NA 136  K 3.8  CL 103  CO2 29  GLUCOSE 176*  BUN 10  CREATININE 0.59*  CALCIUM 8.6*  MG 2.1   ------------------------------------------------------------------------------------------------------------------  Cardiac Enzymes No results for input(s): TROPONINI in the last 168 hours. ------------------------------------------------------------------------------------------------------------------  RADIOLOGY:  Dg Chest Port 1 View  Result Date: 10/22/2018 CLINICAL DATA:  Bedside PICC placement. EXAM: PORTABLE CHEST 1 VIEW COMPARISON:  None. FINDINGS: RIGHT arm PICC tip projects at or near the expected location of the cavoatrial junction. Cardiac silhouette and mediastinal contours normal in appearance for the AP portable technique. Pulmonary parenchyma clear. Bronchovascular markings normal. Pulmonary vascularity normal. No pneumothorax. No visible pleural effusions. IMPRESSION: 1. RIGHT arm PICC tip projects at or near the expected location of the cavoatrial junction. 2. No acute cardiopulmonary disease. Electronically Signed   By: Hulan Saashomas  Lawrence M.D.   On: 10/22/2018 18:42   [place] Ekg Site Rite  Result Date: 10/22/2018 If Site Rite image not attached, placement could not be confirmed due to current cardiac rhythm.   ASSESSMENT AND PLAN:   Active Problems:   Diabetic foot infection (HCC)  1. 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  S/p left lower extremity angiogram 12/26. 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 - On IV antibiotics with IV Zosyn.  Infectious disease specialist recommends  additional 3 weeks of IV antibiotics with Zosyn to complete treatment  duration on discharge to skilled nursing facility.. - Pain control Patient evaluated by vascular surgery. Now s/p Arteriogram and catheter directed tPA and mechanical thrombectomy L SFA, popliteal arteries and tibioperoneal trunk. Patient reported to have had thrombolytic therapy recently PICC line already placed for long-term IV antibiotics x3 weeks of IV Zosyn at skilled nursing facility Discussed the case with Dr. Graciela Husbands podiatry they recommend watching patient over the weekend, patient may need further amputation of the foot   2. Uncontrolled type 2 diabetes- recent A1c 8.1%. -Continue Lantus 15 units nightly and SSI  3. History of DVT- unclear when this occurred Patient may need further surgery I will hold Eliquis.  Start heparin  4. Peripheral vascular disease- s/p angiogram 12/26 by Dr. Wyn Quaker.  Now s/p Arteriogram and catheter directed tPA and mechanical thrombectomy L SFA, popliteal arteries and tibioperoneal trunk. Patient may still eventually require a femoral to distal bypass or an amputation and risk of limb loss is extremely high per documentation from Dr.  Wyn Quaker on 10/21/2018. Outpatient vascular follow-up   5. Hyperlipidemia-stable -Continue home Lipitor  6.  Acute metabolic encephalopathy. Patient doing much better likely due to underlying infection ongoing medical issues  DVT prophylaxis; IV heparin CODE STATUS: Full.  Disposition; to coordinate with social worker regarding discharge to skilled nursing facility tomorrow to complete 3 weeks of IV Zosyn as recommended by infectious disease specialist.  Follow-up with vascular surgeon as outpatient  TOTAL TIME TAKING CARE OF THIS PATIENT: 35 minutes.   Disposition; awaiting transfer out of ICU once bed available To follow-up with further recommendations from vascular surgery before discharge from the hospital  Auburn Bilberry M.D on 10/23/2018    Between 7am to 6pm - Pager - [phone]   After 6pm go to [email or web address] - password [email or web address]  [facility] [place] [facility]  Office  [phone]  CC: Primary care physician; Preston Fleeting, MD  Note: This dictation was prepared with Dragon dictation along with smaller phrase technology. Any transcriptional errors that result from this process are unintentional.

## 2018-10-23 NOTE — Progress Notes (Signed)
Advanced care plan.  Purpose of the Encounter: CODE STATUS  Parties in Attendance: Patient himself  Patient's Decision Capacity: Intact  Subjective/Patient's story: Patient is 63 year old with severe left foot diabetic infection, poorly controlled diabetes, history of DVT, peripheral vascular disease admitted with left diabetic foot wound infection   Objective/Medical story  I discussed with the patient regarding his desires for cardiac and pulmonary resuscitation  Goals of care determination:   Patient states that he would like to remain a full code.  Continue aggressive therapy  CODE STATUS:  Full code  Time spent discussing advanced care planning: 16 minutes

## 2018-10-23 NOTE — Progress Notes (Signed)
ANTICOAGULATION CONSULT NOTE - Initial Consult  Pharmacy Consult for Heparin Drip Indication: DVT- pt on apixaban- likely surgery on Monday 10/26/2018 and MD changing to Heparin  No Known Allergies  Patient Measurements: Height: 6\' 4"  (193 cm) Weight: 174 lb 13.2 oz (79.3 kg) IBW/kg (Calculated) : 86.8 Heparin Dosing Weight: 79.3 kg  Vital Signs: Temp: 97.7 F (36.5 C) (01/17 1145) Temp Source: Axillary (01/17 1145) BP: 105/85 (01/17 1145) Pulse Rate: 102 (01/17 1145)  Labs: Recent Labs    10/21/18 0424 10/23/18 0553  HGB 9.2* 8.5*  HCT 27.8* 25.4*  PLT 194 211  CREATININE 0.94 0.59*    Estimated Creatinine Clearance: 106 mL/min (A) (by C-G formula based on SCr of 0.59 mg/dL (L)).   Medical History: Past Medical History:  Diagnosis Date  . Diabetes (HCC)   . DVT (deep venous thrombosis) (HCC)     Medications:  Scheduled:  . ALPRAZolam  0.5 mg Oral TID  . atorvastatin  80 mg Oral q1800  . diclofenac sodium  2 g Topical QID  . heparin  4,000 Units Intravenous Once  . insulin aspart  0-15 Units Subcutaneous TID WC  . insulin aspart  0-5 Units Subcutaneous QHS  . insulin glargine  15 Units Subcutaneous QHS  . multivitamin-lutein  1 capsule Oral Daily  . nicotine  21 mg Transdermal Daily  . ENSURE MAX PROTEIN  11 oz Oral BID  . sodium chloride flush  3 mL Intravenous Q12H   Infusions:  . sodium chloride 250 mL (10/23/18 1237)  . heparin    . 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 Podiatry recommended watching patient over the weekend, patient may need further amputation of the foot. Last dose of apixaban given inpatient on 10/23/2018 at ~1000. Hgb 8.5  Plt 211   APTTand Heparin level scheduled for 2000.   Goal of Therapy:  Heparin level 0.3-0.7 units/ml aPTT 66-102 seconds Monitor platelets by anticoagulation protocol: Yes   Plan:   Give 4000 units bolus x 1 Start heparin infusion at 1100 units/hr Check anti-Xa level in 6 hours and daily while on heparin Continue to monitor H&H and platelets  Will order Heparin bolus/drip to start 12 hrs after last dose of apixaban. ** Will evaluate Heparin level and aPTT to assess which to follow initially since patient has been on apixaban **  Rana Adorno A 10/23/2018,2:50 PM

## 2018-10-23 NOTE — Progress Notes (Signed)
Inpatient Diabetes Program Recommendations  AACE/ADA: New Consensus Statement on Inpatient Glycemic Control   Target Ranges:  Prepandial:   less than 140 mg/dL      Peak postprandial:   less than 180 mg/dL (1-2 hours)      Critically ill patients:  140 - 180 mg/dL   Results for Austin [name], Austin [name] (MRN [id number]) as of 10/23/2018 10:35  Ref. Range 10/22/2018 07:43 10/22/2018 12:14 10/22/2018 16:38 10/22/2018 19:14 10/22/2018 21:20 10/23/2018 07:45  Glucose-Capillary Latest Ref Range: 70 - 99 mg/dL 409114 (H) 811226 (H) 914231 (H) 256 (H) 285 (H) 174 (H)   Review of Glycemic Control  Diabetes history:DM2 Outpatient Diabetes medications:Lantus 15 units daily Current orders for Inpatient glycemic control: Lantus 15 units QHS, Novolog 0-15 units TID with meals, Novolog 0-5 units QHS  Inpatient Diabetes Program Recommendations:  Insulin - Meal Coverage: Post prandial glucose is consistently elevated.  Please consider ordering Novolog 4 units TID with meals for meal coverage if patient eats at least 50% of meals.  Thanks, Orlando PennerMarie Dhanya Bogle, RN, MSN, CDE Diabetes Coordinator Inpatient Diabetes Program [phone] (Team Pager from 8am to 5pm)

## 2018-10-23 NOTE — Clinical Social Work Note (Signed)
Patient has received re-auth and patient can go to [name] when time. York Spaniel MSW,LCSW [phone]

## 2018-10-23 NOTE — Progress Notes (Signed)
Pharmacy Antibiotic Note  Austin [name] is a 63 y.o. male admitted on 10/14/2018 with Wound Infection.  Pharmacy has been consulted for  pip/tazo dosing.  Necrotic infection of L foot - s/p transmetatarsal amputation on 10/16/18.  Culture positive for Pseudomonas and Proteus.  Plan: Day 10- Continue Zosyn 3.375g IV q8h (4 hour infusion). Per ID note: continue thru 11/06/2018   Height: 6\' 4"  (193 cm) Weight: 174 lb 13.2 oz (79.3 kg) IBW/kg (Calculated) : 86.8  Temp (24hrs), Avg:98.3 F (36.8 C), Min:97.6 F (36.4 C), Max:99 F (37.2 C)  Recent Labs  Lab 10/17/18 0509  10/18/18 0534 10/19/18 2008 10/19/18 2248 10/20/18 0515 10/21/18 0424 10/23/18 0553  WBC  --    < > 10.7* 13.6* 14.4* 13.4* 15.7* 11.2*  CREATININE 0.79  --  0.73  --   --  1.05 0.94 0.59*   < > = values in this interval not displayed.    Estimated Creatinine Clearance: 106 mL/min (A) (by C-G formula based on SCr of 0.59 mg/dL (L)).    No Known Allergies  Antimicrobials this admission: Zosyn 1/8  >>  Vanocmycin 1/8 >> 1/12  Clindamycin 1/13 x 1  Cefazolin 1/14 x 1   Dose adjustments this admission: none  Microbiology results: 1/9 MRSA PCR: negative  1/10 Left foot: pseudomonas aeruginosa/ proteus vulgaris   Thank you for allowing pharmacy to be a part of this patient's care.  Angelique Blonder, PharmD Clinical Pharmacist 10/23/2018 9:48 AM

## 2018-10-23 NOTE — Progress Notes (Signed)
Physical Therapy Treatment Patient Details Name: Austin [name] MRN: [id number] DOB: 1955-11-03 Today's Date: 10/23/2018    History of Present Illness 63 y.o. male with a known history of type 2 diabetes and history DVT who was directly admitted from the podiatry office for persistent left lower extremity diabetic foot infection.  He underwent debridement and first and second toe amputations on 09/30/2018.  He has been on antibiotics as an outpatient, but his wound has continued to worsen.  Underwent left lower extremity angiogram on 10/01/2018 with angioplasty and stent placement.  He denies any fevers or chills.  He endorses some mild left foot pain.  No lower extremity edema. He underwent L transmetatarsal amputation 10/16/18.     PT Comments    Spoke with nursing before session about how difficult a time pt had keeping weight off L foot in sitting, he reports he got him to sitting earlier today and had the same situation.  Considering we are assessing/trying to save as much of the residual limb, we decided that protecting in from impact/WBing was more beneficial at this point and to defer mobility.  Pt was able to participate with supine exercises and showed good effort despite very limited quad strength on L as well as continued considerable tightness in h/s; did get to nearly full knee extension after 3 different rounds of 5 x 5 second hold hamstring stretches.   Follow Up Recommendations  SNF     Equipment Recommendations       Recommendations for Other Services       Precautions / Restrictions Precautions Precautions: Fall Restrictions LLE Weight Bearing: Non weight bearing    Mobility  Bed Mobility               General bed mobility comments: deferred bed mobility, see daily note  Transfers                    Ambulation/Gait                 Stairs             Wheelchair Mobility    Modified Rankin (Stroke Patients Only)        Balance                                            Cognition Arousal/Alertness: Awake/alert Behavior During Therapy: Impulsive;WFL for tasks assessed/performed Overall Cognitive Status: Within Functional Limits for tasks assessed                                 General Comments: shows good effort follow instructions, at times in inconsistent      Exercises General Exercises - Lower Extremity Ankle Circles/Pumps: AROM;15 reps(very limited movement on L, but able to do) Quad Sets: AAROM;15 reps(L patellar tendon appears compromised) Short Arc Quad: AAROM;Strengthening;15 reps(no AROM on L, good effort with resistance on R) Heel Slides: AROM;10 reps(resisted leg extension on the R) Hip ABduction/ADduction: Strengthening;10 reps    General Comments        Pertinent Vitals/Pain Pain Assessment: 0-10 Pain Score: 5  Pain Location: L foot     Home Living                      Prior Function  PT Goals (current goals can now be found in the care plan section) Progress towards PT goals: Progressing toward goals    Frequency    Min 2X/week      PT Plan Frequency needs to be updated    Co-evaluation              AM-PAC PT "6 Clicks" Mobility   Outcome Measure  Help needed turning from your back to your side while in a flat bed without using bedrails?: A Little Help needed moving from lying on your back to sitting on the side of a flat bed without using bedrails?: A Little Help needed moving to and from a bed to a chair (including a wheelchair)?: Total Help needed standing up from a chair using your arms (e.g., wheelchair or bedside chair)?: Total Help needed to walk in hospital room?: Total Help needed climbing 3-5 steps with a railing? : Total 6 Click Score: 10    End of Session   Activity Tolerance: Patient limited by fatigue Patient left: in bed;with call bell/phone within reach;with bed alarm set Nurse  Communication: Mobility status PT Visit Diagnosis: Unsteadiness on feet (R26.81);Other abnormalities of gait and mobility (R26.89);Pain;Muscle weakness (generalized) (M62.81) Pain - Right/Left: Left Pain - part of body: Ankle and joints of foot     Time: 1520-1544 PT Time Calculation (min) (ACUTE ONLY): 24 min  Charges:  $Therapeutic Exercise: 23-37 mins                     Malachi ProGalen R Maysin Carstens, DPT 10/23/2018, 4:43 PM

## 2018-10-24 LAB — CBC
HCT: 28.1 % — ABNORMAL LOW (ref 39.0–52.0)
Hemoglobin: 9.2 g/dL — ABNORMAL LOW (ref 13.0–17.0)
MCH: 28.8 pg (ref 26.0–34.0)
MCHC: 32.7 g/dL (ref 30.0–36.0)
MCV: 87.8 fL (ref 80.0–100.0)
Platelets: 253 10*3/uL (ref 150–400)
RBC: 3.2 MIL/uL — ABNORMAL LOW (ref 4.22–5.81)
RDW: 12.5 % (ref 11.5–15.5)
WBC: 11.1 10*3/uL — ABNORMAL HIGH (ref 4.0–10.5)
nRBC: 0 % (ref 0.0–0.2)

## 2018-10-24 LAB — BASIC METABOLIC PANEL
Anion gap: 5 (ref 5–15)
BUN: 12 mg/dL (ref 8–23)
CO2: 30 mmol/L (ref 22–32)
Calcium: 8.8 mg/dL — ABNORMAL LOW (ref 8.9–10.3)
Chloride: 99 mmol/L (ref 98–111)
Creatinine, Ser: 0.7 mg/dL (ref 0.61–1.24)
GFR calc Af Amer: >60 mL/min (ref >60)
GFR calc non Af Amer: >60 mL/min (ref >60)
Glucose, Bld: 205 mg/dL — ABNORMAL HIGH (ref 70–99)
POTASSIUM: 4.3 mmol/L (ref 3.5–5.1)
Sodium: 134 mmol/L — ABNORMAL LOW (ref 135–145)

## 2018-10-24 LAB — APTT
APTT: 54 s — AB (ref 24–36)
APTT: 53 s — AB (ref 24–36)
aPTT: 51 seconds — ABNORMAL HIGH (ref 24–36)

## 2018-10-24 LAB — GLUCOSE, CAPILLARY
Glucose-Capillary: 161 mg/dL — ABNORMAL HIGH (ref 70–99)
Glucose-Capillary: 179 mg/dL — ABNORMAL HIGH (ref 70–99)
Glucose-Capillary: 247 mg/dL — ABNORMAL HIGH (ref 70–99)
Glucose-Capillary: 184 mg/dL — ABNORMAL HIGH (ref 70–99)

## 2018-10-24 LAB — HEPARIN LEVEL (UNFRACTIONATED): HEPARIN UNFRACTIONATED: 0.67 [IU]/mL (ref 0.30–0.70)

## 2018-10-24 MED ORDER — HEPARIN BOLUS VIA INFUSION
1200.0000 [IU] | Freq: Once | INTRAVENOUS | Status: AC
  Administered 2018-10-24: 1200 [IU] via INTRAVENOUS
  Filled 2018-10-24: qty 1200

## 2018-10-24 NOTE — Progress Notes (Signed)
ANTICOAGULATION CONSULT NOTE - Initial Consult  Pharmacy Consult for Heparin Drip Indication: DVT- pt on apixaban- likely surgery on Monday 10/26/2018 and MD changing to Heparin  No Known Allergies  Patient Measurements: Height: 6\' 4"  (193 cm) Weight: 174 lb 13.2 oz (79.3 kg) IBW/kg (Calculated) : 86.8 Heparin Dosing Weight: 79.3 kg  Vital Signs: Temp: 97.7 F (36.5 C) (01/18 0446) Temp Source: Oral (01/18 0446) BP: 119/66 (01/18 0446) Pulse Rate: 102 (01/18 0446)  Labs: Recent Labs    10/23/18 0553 10/23/18 2028 10/24/18 0423  HGB 8.5*  --  9.2*  HCT 25.4*  --  28.1*  PLT 211  --  253  APTT  --  35 51*  LABPROT  --  14.7  --   INR  --  1.16  --   HEPARINUNFRC  --  1.34* 0.67  CREATININE 0.59*  --  0.70    Estimated Creatinine Clearance: 106 mL/min (by C-G formula based on SCr of 0.7 mg/dL).   Medical History: Past Medical History:  Diagnosis Date  . Diabetes (HCC)   . DVT (deep venous thrombosis) (HCC)     Medications:  Scheduled:  . ALPRAZolam  0.5 mg Oral TID  . atorvastatin  80 mg Oral q1800  . diclofenac sodium  2 g Topical QID  . heparin  1,200 Units Intravenous Once  . insulin aspart  0-15 Units Subcutaneous TID WC  . insulin aspart  0-5 Units Subcutaneous QHS  . insulin glargine  15 Units Subcutaneous QHS  . multivitamin-lutein  1 capsule Oral Daily  . nicotine  21 mg Transdermal Daily  . ENSURE MAX PROTEIN  11 oz Oral BID  . sodium chloride flush  3 mL Intravenous Q12H   Infusions:  . sodium chloride Stopped (10/24/18 0417)  . heparin 1,100 Units/hr (10/24/18 0443)  . 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 Podiatry recommended watching patient over the weekend, patient may need further amputation of the foot. Last dose of apixaban given inpatient on 10/23/2018 at ~1000. Hgb 8.5  Plt 211   APTTand Heparin level scheduled  for 2000.   Goal of Therapy:  Heparin level 0.3-0.7 units/ml aPTT 66-102 seconds Monitor platelets by anticoagulation protocol: Yes   Plan:  01/18 @ 0500 aPTT 51 seconds - subtherapeutic; HL 0.61 not correlating. Will rebolus w/ heparin 1200 units IV x 1 and increase rate to 1250 units/hr and will recheck aPTT @ 1100 w/ HL check w/ am labs. CBC low stable will continue to monitor.  Thomasene Ripple, PharmD, BCPS Clinical Pharmacist 10/24/2018

## 2018-10-24 NOTE — Progress Notes (Addendum)
[facility] [place] [facility]   PATIENT NAME: Austin [name]    MR#:  [id number]  DATE OF BIRTH:  28-Oct-1955  SUBJECTIVE:  CHIEF COMPLAINT:  Patient states that his foot is hurting  REVIEW OF SYSTEMS:  CONSTITUTIONAL: No fever, fatigue or weakness.  EYES: No blurred or double vision.  EARS, NOSE, AND THROAT: No tinnitus or ear pain.  RESPIRATORY: No cough, shortness of breath, wheezing or hemoptysis.  CARDIOVASCULAR: No chest pain, orthopnea, edema.  GASTROINTESTINAL: No nausea, vomiting, diarrhea or abdominal pain.  GENITOURINARY: No dysuria, hematuria.  ENDOCRINE: No polyuria, nocturia,  HEMATOLOGY: No anemia, easy bruising or bleeding SKIN: No rash or lesion. MUSCULOSKELETAL: No joint pain or arthritis.   NEUROLOGIC: No tingling, numbness, weakness.  PSYCHIATRY: No anxiety or depression.     DRUG ALLERGIES:  No Known Allergies  VITALS:  Blood pressure 121/70, pulse 100, temperature 98.4 F (36.9 C), temperature source Axillary, resp. rate 18, height 6\' 4"  (1.93 m), weight 79.3 kg, SpO2 94 %.  PHYSICAL EXAMINATION:  GENERAL:  63 y.o.-year-old patient lying in the bed with no acute distress.  EYES: Pupils equal, round, reactive to light and accommodation. No scleral icterus.HEENT: Head atraumatic, normocephalic. Oropharynx and nasopharynx clear.  NECK:  Supple, no jugular venous distention. No thyroid enlargement, no tenderness.  LUNGS: Normal breath sounds bilaterally, no wheezing, rales,rhonchi or crepitation. No use of accessory muscles of respiration.  CARDIOVASCULAR: S1, S2 normal. No murmurs, rubs, or gallops.  ABDOMEN: Soft, nontender, nondistended. Bowel sounds present. No organomegaly or mass.  EXTREMITIES: Dressing in place to site of left foot amputation.    NEUROLOGIC: Cranial nerves II through XII are intact. Muscle strength 4/5 in all extremities. Sensation intact. Gait not checked.  PSYCHIATRIC: The patient is alert and oriented x 2.   SKIN: No obvious rash, lesion, or ulcer.    LABORATORY PANEL:   CBC Recent Labs  Lab 10/24/18 0423  WBC 11.1*  HGB 9.2*  HCT 28.1*  PLT 253   ------------------------------------------------------------------------------------------------------------------  Chemistries  Recent Labs  Lab 10/23/18 0553 10/24/18 0423  NA 136 134*  K 3.8 4.3  CL 103 99  CO2 29 30  GLUCOSE 176* 205*  BUN 10 12  CREATININE 0.59* 0.70  CALCIUM 8.6* 8.8*  MG 2.1  --    ------------------------------------------------------------------------------------------------------------------  Cardiac Enzymes No results for input(s): TROPONINI in the last 168 hours. ------------------------------------------------------------------------------------------------------------------  RADIOLOGY:  Dg Chest Port 1 View  Result Date: 10/22/2018 CLINICAL DATA:  Bedside PICC placement. EXAM: PORTABLE CHEST 1 VIEW COMPARISON:  None. FINDINGS: RIGHT arm PICC tip projects at or near the expected location of the cavoatrial junction. Cardiac silhouette and mediastinal contours normal in appearance for the AP portable technique. Pulmonary parenchyma clear. Bronchovascular markings normal. Pulmonary vascularity normal. No pneumothorax. No visible pleural effusions. IMPRESSION: 1. RIGHT arm PICC tip projects at or near the expected location of the cavoatrial junction. 2. No acute cardiopulmonary disease. Electronically Signed   By: Hulan Saas M.D.   On: 10/22/2018 18:42   [place] Ekg Site Rite  Result Date: 10/22/2018 If Site Rite image not attached, placement could not be confirmed due to current cardiac rhythm.   ASSESSMENT AND PLAN:   Active Problems:   Diabetic foot infection (HCC)  1. 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  S/p left lower extremity angiogram 12/26. 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 - On IV antibiotics with IV Zosyn.   Infectious disease  specialist recommends additional 3 weeks of IV antibiotics with Zosyn to complete treatment duration on discharge to skilled nursing facility.. - Pain control Patient evaluated by vascular surgery. Now s/p Arteriogram and catheter directed tPA and mechanical thrombectomy L SFA, popliteal arteries and tibioperoneal trunk. Patient reported to have had thrombolytic therapy recently PICC line already placed for long-term IV antibiotics x3 weeks of IV Zosyn at skilled nursing facility Continue current care may need further debridement  2. Uncontrolled type 2 diabetes- recent A1c 8.1%. -Continue Lantus 15 units nightly and SSI -Blood sugars currently stable  3. History of DVT- unclear when this occurred Continue IV heparin Eliquis on hold  4. Peripheral vascular disease- s/p angiogram 12/26 by Dr. Wyn Quakerew.  Now s/p Arteriogram and catheter directed tPA and mechanical thrombectomy L SFA, popliteal arteries and tibioperoneal trunk. Patient may still eventually require a femoral to distal bypass or an amputation and risk of limb loss is extremely high per documentation from Dr.  Wyn Quakerew on 10/21/2018. Outpatient vascular follow-up   5. Hyperlipidemia-stable -Continue home Lipitor  6.  Acute metabolic encephalopathy. Patient doing much better likely due to underlying infection and high-dose morphine  DVT prophylaxis; IV heparin CODE STATUS: Full.  Disposition; to rehab with IV antibiotics TOTAL TIME TAKING CARE OF THIS PATIENT: 35 minutes.   Disposition; awaiting transfer out of ICU once bed available To follow-up with further recommendations from vascular surgery before discharge from the hospital  Auburn BilberryShreyang Michaella Imai M.D on 10/24/2018   Between 7am to 6pm - Pager - [phone]   After 6pm go to [email or web address] - password [email or web address]  [facility] [place] [facility]  Office  [phone]  CC: Primary care physician; [name], [name], MD  Note: This dictation was prepared  with Dragon dictation along with smaller phrase technology. Any transcriptional errors that result from this process are unintentional.

## 2018-10-24 NOTE — Progress Notes (Addendum)
Dressing changed to left foot packing with saline, then dry gauze and ace wrap. Patient tolerated well. Opening where toes use to be, old blood cleansed from foot and some necrotictissue to area.

## 2018-10-24 NOTE — Progress Notes (Signed)
ANTICOAGULATION CONSULT NOTE - Initial Consult  Pharmacy Consult for Heparin Drip Indication: DVT- pt on apixaban- likely surgery on Monday 10/26/2018 and MD changing to Heparin  No Known Allergies  Patient Measurements: Height: 6\' 4"  (193 cm) Weight: 174 lb 13.2 oz (79.3 kg) IBW/kg (Calculated) : 86.8 Heparin Dosing Weight: 79.3 kg  Vital Signs: Temp: 98.4 F (36.9 C) (01/18 1136) Temp Source: Axillary (01/18 1136) BP: 121/70 (01/18 1136) Pulse Rate: 100 (01/18 1136)  Labs: Recent Labs    10/23/18 0553 10/23/18 2028 10/24/18 0423 10/24/18 1057  HGB 8.5*  --  9.2*  --   HCT 25.4*  --  28.1*  --   PLT 211  --  253  --   APTT  --  35 51* 54*  LABPROT  --  14.7  --   --   INR  --  1.16  --   --   HEPARINUNFRC  --  1.34* 0.67  --   CREATININE 0.59*  --  0.70  --     Estimated Creatinine Clearance: 106 mL/min (by C-G formula based on SCr of 0.7 mg/dL).   Medical History: Past Medical History:  Diagnosis Date  . Diabetes (HCC)   . DVT (deep venous thrombosis) (HCC)     Medications:  Scheduled:  . ALPRAZolam  0.5 mg Oral TID  . atorvastatin  80 mg Oral q1800  . diclofenac sodium  2 g Topical QID  . insulin aspart  0-15 Units Subcutaneous TID WC  . insulin aspart  0-5 Units Subcutaneous QHS  . insulin glargine  15 Units Subcutaneous QHS  . multivitamin-lutein  1 capsule Oral Daily  . nicotine  21 mg Transdermal Daily  . ENSURE MAX PROTEIN  11 oz Oral BID  . sodium chloride flush  3 mL Intravenous Q12H   Infusions:  . sodium chloride Stopped (10/24/18 0417)  . heparin 1,250 Units/hr (10/24/18 0626)  . 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 Podiatry recommended watching patient over the weekend, patient may need further amputation of the foot. Last dose of apixaban given inpatient on 10/23/2018 at ~1000. Hgb 8.5  Plt 211   APTTand Heparin  level scheduled for 2000.  01/18 @ 0500 aPTT 51 seconds - subtherapeutic; HL 0.61 not correlating. Will rebolus w/ heparin 1200 units IV x 1 and increase rate to 1250 units/hr and will recheck aPTT @ 1100 w/ HL check w/ am labs. CBC low stable will continue to monitor.   Goal of Therapy:  Heparin level 0.3-0.7 units/ml aPTT 66-102 seconds Monitor platelets by anticoagulation protocol: Yes   Plan:  01/18 1057 aPTT= 54. Will increase Heparin drip to 1350 units/hr. Recheck aPTT in 6 hrs. (f/u HL tomorrow am to see if will correlate with aPTT)  Bari Mantis PharmD Clinical Pharmacist 10/24/2018

## 2018-10-24 NOTE — Progress Notes (Signed)
ANTICOAGULATION CONSULT NOTE - Initial Consult  Pharmacy Consult for Heparin Drip Indication: DVT- pt on apixaban- likely surgery on Monday 10/26/2018 and MD changing to Heparin  No Known Allergies  Patient Measurements: Height: 6\' 4"  (193 cm) Weight: 174 lb 13.2 oz (79.3 kg) IBW/kg (Calculated) : 86.8 Heparin Dosing Weight: 79.3 kg  Vital Signs: Temp: 98.4 F (36.9 C) (01/18 1136) Temp Source: Axillary (01/18 1136) BP: 121/70 (01/18 1136) Pulse Rate: 100 (01/18 1136)  Labs: Recent Labs    10/23/18 0553  10/23/18 2028 10/24/18 0423 10/24/18 1057 10/24/18 1746  HGB 8.5*  --   --  9.2*  --   --   HCT 25.4*  --   --  28.1*  --   --   PLT 211  --   --  253  --   --   APTT  --    < > 35 51* 54* 53*  LABPROT  --   --  14.7  --   --   --   INR  --   --  1.16  --   --   --   HEPARINUNFRC  --   --  1.34* 0.67  --   --   CREATININE 0.59*  --   --  0.70  --   --    < > = values in this interval not displayed.    Estimated Creatinine Clearance: 106 mL/min (by C-G formula based on SCr of 0.7 mg/dL).   Medical History: Past Medical History:  Diagnosis Date  . Diabetes (HCC)   . DVT (deep venous thrombosis) (HCC)     Medications:  Scheduled:  . ALPRAZolam  0.5 mg Oral TID  . atorvastatin  80 mg Oral q1800  . diclofenac sodium  2 g Topical QID  . insulin aspart  0-15 Units Subcutaneous TID WC  . insulin aspart  0-5 Units Subcutaneous QHS  . insulin glargine  15 Units Subcutaneous QHS  . multivitamin-lutein  1 capsule Oral Daily  . nicotine  21 mg Transdermal Daily  . ENSURE MAX PROTEIN  11 oz Oral BID  . sodium chloride flush  3 mL Intravenous Q12H   Infusions:  . sodium chloride Stopped (10/24/18 0417)  . heparin 1,350 Units/hr (10/24/18 1436)  . 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 Podiatry recommended watching patient over the weekend, patient  may need further amputation of the foot. Last dose of apixaban given inpatient on 10/23/2018 at ~1000. Hgb 8.5  Plt 211   APTTand Heparin level scheduled for 2000.  01/18 @ 0500 aPTT 51 seconds - subtherapeutic; HL 0.61 not correlating. Will rebolus w/ heparin 1200 units IV x 1 and increase rate to 1250 units/hr and will recheck aPTT @ 1100 w/ HL check w/ am labs. CBC low stable will continue to monitor.   Goal of Therapy:  Heparin level 0.3-0.7 units/ml aPTT 66-102 seconds Monitor platelets by anticoagulation protocol: Yes   Plan:  01/18 1800 aPTT= 53. Will bolus Heparin 1200 units and increase drip rate to 1500 units/hr. Recheck aPTT in 6 hrs. (f/u HL tomorrow am to see if will correlate with aPTT)  Abbie Sons, PharmD Clinical Pharmacist 10/24/2018

## 2018-10-25 LAB — CBC
HCT: 28.5 % — ABNORMAL LOW (ref 39.0–52.0)
Hemoglobin: 9.5 g/dL — ABNORMAL LOW (ref 13.0–17.0)
MCH: 29.1 pg (ref 26.0–34.0)
MCHC: 33.3 g/dL (ref 30.0–36.0)
MCV: 87.2 fL (ref 80.0–100.0)
NRBC: 0 % (ref 0.0–0.2)
Platelets: 259 10*3/uL (ref 150–400)
RBC: 3.27 MIL/uL — ABNORMAL LOW (ref 4.22–5.81)
RDW: 12.7 % (ref 11.5–15.5)
WBC: 10.5 10*3/uL (ref 4.0–10.5)

## 2018-10-25 LAB — APTT
aPTT: 72 seconds — ABNORMAL HIGH (ref 24–36)
aPTT: 76 seconds — ABNORMAL HIGH (ref 24–36)

## 2018-10-25 LAB — GLUCOSE, CAPILLARY
Glucose-Capillary: 181 mg/dL — ABNORMAL HIGH (ref 70–99)
Glucose-Capillary: 241 mg/dL — ABNORMAL HIGH (ref 70–99)
Glucose-Capillary: 282 mg/dL — ABNORMAL HIGH (ref 70–99)
Glucose-Capillary: 240 mg/dL — ABNORMAL HIGH (ref 70–99)

## 2018-10-25 LAB — HEPARIN LEVEL (UNFRACTIONATED)
HEPARIN UNFRACTIONATED: 0.42 [IU]/mL (ref 0.30–0.70)
Heparin Unfractionated: 0.44 IU/mL (ref 0.30–0.70)
Heparin Unfractionated: 0.29 IU/mL — ABNORMAL LOW (ref 0.30–0.70)

## 2018-10-25 MED ORDER — HEPARIN BOLUS VIA INFUSION
1200.0000 [IU] | Freq: Once | INTRAVENOUS | Status: AC
  Administered 2018-10-25: 1200 [IU] via INTRAVENOUS
  Filled 2018-10-25: qty 1200

## 2018-10-25 NOTE — Progress Notes (Signed)
ANTICOAGULATION CONSULT NOTE - Initial Consult  Pharmacy Consult for Heparin Drip Indication: DVT- pt on apixaban- likely surgery on Monday 10/26/2018 and MD changing to Heparin  No Known Allergies  Patient Measurements: Height: 6\' 4"  (193 cm) Weight: 174 lb 13.2 oz (79.3 kg) IBW/kg (Calculated) : 86.8 Heparin Dosing Weight: 79.3 kg  Vital Signs: Temp: 98 F (36.7 C) (01/19 0508) Temp Source: Oral (01/19 0508) BP: 95/76 (01/19 0508) Pulse Rate: 111 (01/19 0508)  Labs: Recent Labs    10/23/18 0553  10/23/18 2028 10/24/18 0423  10/24/18 1746 10/25/18 0039 10/25/18 0452  HGB 8.5*  --   --  9.2*  --   --   --  9.5*  HCT 25.4*  --   --  28.1*  --   --   --  28.5*  PLT 211  --   --  253  --   --   --  259  APTT  --    < > 35 51*   < > 53* 72* 76*  LABPROT  --   --  14.7  --   --   --   --   --   INR  --   --  1.16  --   --   --   --   --   HEPARINUNFRC  --   --  1.34* 0.67  --   --   --  0.44  CREATININE 0.59*  --   --  0.70  --   --   --   --    < > = values in this interval not displayed.    Estimated Creatinine Clearance: 106 mL/min (by C-G formula based on SCr of 0.7 mg/dL).   Medical History: Past Medical History:  Diagnosis Date  . Diabetes (HCC)   . DVT (deep venous thrombosis) (HCC)     Medications:  Scheduled:  . ALPRAZolam  0.5 mg Oral TID  . atorvastatin  80 mg Oral q1800  . diclofenac sodium  2 g Topical QID  . insulin aspart  0-15 Units Subcutaneous TID WC  . insulin aspart  0-5 Units Subcutaneous QHS  . insulin glargine  15 Units Subcutaneous QHS  . multivitamin-lutein  1 capsule Oral Daily  . nicotine  21 mg Transdermal Daily  . ENSURE MAX PROTEIN  11 oz Oral BID  . sodium chloride flush  3 mL Intravenous Q12H   Infusions:  . sodium chloride Stopped (10/24/18 0417)  . heparin 1,500 Units/hr (10/24/18 1831)  . 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 Podiatry recommended watching patient over the weekend, patient may need further amputation of the foot. Last dose of apixaban given inpatient on 10/23/2018 at ~1000. Hgb 8.5  Plt 211   APTTand Heparin level scheduled for 2000.  01/18 @ 0500 aPTT 51 seconds - subtherapeutic; HL 0.61 not correlating. Will rebolus w/ heparin 1200 units IV x 1 and increase rate to 1250 units/hr and will recheck aPTT @ 1100 w/ HL check w/ am labs. CBC low stable will continue to monitor.   Goal of Therapy:  Heparin level 0.3-0.7 units/ml aPTT 66-102 seconds Monitor platelets by anticoagulation protocol: Yes   Plan:  01/19 @ 0500 aPTT 76 seconds therapeutic;HL 0.44. Will continue current rate and will recheck aPTT w/ am labs w/ HL and will continue f/u w/ CBC.  Thomasene Ripple, PharmD, BCPS Clinical Pharmacist  10/25/2018  

## 2018-10-25 NOTE — Progress Notes (Signed)
ANTICOAGULATION CONSULT NOTE -  Pharmacy Consult for Heparin Drip Indication: DVT- pt on apixaban- likely surgery on Monday 10/26/2018 and MD changing to Heparin  No Known Allergies  Patient Measurements: Height: 6\' 4"  (193 cm) Weight: 174 lb 13.2 oz (79.3 kg) IBW/kg (Calculated) : 86.8 Heparin Dosing Weight: 79.3 kg  Vital Signs: Temp: 98 F (36.7 C) (01/19 1500) Temp Source: Oral (01/19 1500) BP: 100/72 (01/19 1500) Pulse Rate: 115 (01/19 1500)  Labs: Recent Labs    10/23/18 0553  10/23/18 2028 10/24/18 0423  10/24/18 1746 10/25/18 0039 10/25/18 0452 10/25/18 1136 10/25/18 1745  HGB 8.5*  --   --  9.2*  --   --   --  9.5*  --   --   HCT 25.4*  --   --  28.1*  --   --   --  28.5*  --   --   PLT 211  --   --  253  --   --   --  259  --   --   APTT  --    < > 35 51*   < > 53* 72* 76*  --   --   LABPROT  --   --  14.7  --   --   --   --   --   --   --   INR  --   --  1.16  --   --   --   --   --   --   --   HEPARINUNFRC  --    < > 1.34* 0.67  --   --   --  0.44 0.29* 0.42  CREATININE 0.59*  --   --  0.70  --   --   --   --   --   --    < > = values in this interval not displayed.    Estimated Creatinine Clearance: 106 mL/min (by C-G formula based on SCr of 0.7 mg/dL).   Medical History: Past Medical History:  Diagnosis Date  . Diabetes (HCC)   . DVT (deep venous thrombosis) (HCC)     Medications:  Scheduled:  . ALPRAZolam  0.5 mg Oral TID  . atorvastatin  80 mg Oral q1800  . diclofenac sodium  2 g Topical QID  . insulin aspart  0-15 Units Subcutaneous TID WC  . insulin aspart  0-5 Units Subcutaneous QHS  . insulin glargine  15 Units Subcutaneous QHS  . multivitamin-lutein  1 capsule Oral Daily  . nicotine  21 mg Transdermal Daily  . ENSURE MAX PROTEIN  11 oz Oral BID  . sodium chloride flush  3 mL Intravenous Q12H   Infusions:  . sodium chloride Stopped (10/24/18 0417)  . heparin 1,650 Units/hr (10/25/18 1218)  . 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 Podiatry recommended watching patient over the weekend, patient may need further amputation of the foot. Last dose of apixaban given inpatient on 10/23/2018 at ~1000. Hgb 8.5  Plt 211   APTTand Heparin level scheduled for 2000.  01/18 @ 0500 aPTT 51 seconds - subtherapeutic; HL 0.61 not correlating. Will rebolus w/ heparin 1200 units IV x 1 and increase rate to 1250 units/hr and will recheck aPTT @ 1100 w/ HL check w/ am labs. CBC low stable will continue to monitor. 01/19 @ 0039 aPTT 72 seconds therapeutic. Will continue current  rate and will recheck aPTT w/ am labs w/ HL and will continue f/u w/ CBC.   Goal of Therapy:  Heparin level 0.3-0.7 units/ml aPTT 66-102 seconds Monitor platelets by anticoagulation protocol: Yes   Plan:  1/19 1745 HL= 0.42. Will continue current rate of Heparin 1650 units/hr. Will recheck HL in 6 hours.  Hgb 9.5  Plt 259  Abbie Sons, PharmD Clinical Pharmacist 10/25/2018

## 2018-10-25 NOTE — Progress Notes (Signed)
ANTICOAGULATION CONSULT NOTE - Initial Consult  Pharmacy Consult for Heparin Drip Indication: DVT- pt on apixaban- likely surgery on Monday 10/26/2018 and MD changing to Heparin  No Known Allergies  Patient Measurements: Height: 6\' 4"  (193 cm) Weight: 174 lb 13.2 oz (79.3 kg) IBW/kg (Calculated) : 86.8 Heparin Dosing Weight: 79.3 kg  Vital Signs: Temp: 98.2 F (36.8 C) (01/18 1951) BP: 140/75 (01/18 1951) Pulse Rate: 111 (01/18 1951)  Labs: Recent Labs    10/23/18 0553  10/23/18 2028 10/24/18 0423 10/24/18 1057 10/24/18 1746 10/25/18 0039  HGB 8.5*  --   --  9.2*  --   --   --   HCT 25.4*  --   --  28.1*  --   --   --   PLT 211  --   --  253  --   --   --   APTT  --    < > 35 51* 54* 53* 72*  LABPROT  --   --  14.7  --   --   --   --   INR  --   --  1.16  --   --   --   --   HEPARINUNFRC  --   --  1.34* 0.67  --   --   --   CREATININE 0.59*  --   --  0.70  --   --   --    < > = values in this interval not displayed.    Estimated Creatinine Clearance: 106 mL/min (by C-G formula based on SCr of 0.7 mg/dL).   Medical History: Past Medical History:  Diagnosis Date  . Diabetes (HCC)   . DVT (deep venous thrombosis) (HCC)     Medications:  Scheduled:  . ALPRAZolam  0.5 mg Oral TID  . atorvastatin  80 mg Oral q1800  . diclofenac sodium  2 g Topical QID  . insulin aspart  0-15 Units Subcutaneous TID WC  . insulin aspart  0-5 Units Subcutaneous QHS  . insulin glargine  15 Units Subcutaneous QHS  . multivitamin-lutein  1 capsule Oral Daily  . nicotine  21 mg Transdermal Daily  . ENSURE MAX PROTEIN  11 oz Oral BID  . sodium chloride flush  3 mL Intravenous Q12H   Infusions:  . sodium chloride Stopped (10/24/18 0417)  . heparin 1,500 Units/hr (10/24/18 1831)  . 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 Podiatry recommended watching patient over the  weekend, patient may need further amputation of the foot. Last dose of apixaban given inpatient on 10/23/2018 at ~1000. Hgb 8.5  Plt 211   APTTand Heparin level scheduled for 2000.  01/18 @ 0500 aPTT 51 seconds - subtherapeutic; HL 0.61 not correlating. Will rebolus w/ heparin 1200 units IV x 1 and increase rate to 1250 units/hr and will recheck aPTT @ 1100 w/ HL check w/ am labs. CBC low stable will continue to monitor.   Goal of Therapy:  Heparin level 0.3-0.7 units/ml aPTT 66-102 seconds Monitor platelets by anticoagulation protocol: Yes   Plan:  01/19 @ 0039 aPTT 72 seconds therapeutic. Will continue current rate and will recheck aPTT w/ am labs w/ HL and will continue f/u w/ CBC.  Thomasene Ripple, PharmD, BCPS Clinical Pharmacist 10/25/2018

## 2018-10-25 NOTE — Progress Notes (Signed)
Patient's left arm infiltrated with Zosyn. Warm compress was placed on the extremity to help absorb fluid. Nurse asked patient " you have not felt any pain in your arm" and patient stated " no, well just a little bit". Nurse educated patient on notifying her if arm began to hurt with IV running and educated patient as to what infiltration was. IV team consult placed to insert new IV.

## 2018-10-25 NOTE — Progress Notes (Addendum)
[facility] - [place] at [facility]   PATIENT NAME: Austin [name]    MR#:  [id number]  DATE OF BIRTH:  12/23/1955  SUBJECTIVE:  CHIEF COMPLAINT:  Continues to complain of pain in the leg    REVIEW OF SYSTEMS:  CONSTITUTIONAL: No fever, fatigue or weakness.  EYES: No blurred or double vision.  EARS, NOSE, AND THROAT: No tinnitus or ear pain.  RESPIRATORY: No cough, shortness of breath, wheezing or hemoptysis.  CARDIOVASCULAR: No chest pain, orthopnea, edema.  GASTROINTESTINAL: No nausea, vomiting, diarrhea or abdominal pain.  GENITOURINARY: No dysuria, hematuria.  ENDOCRINE: No polyuria, nocturia,  HEMATOLOGY: No anemia, easy bruising or bleeding SKIN: No rash or lesion. MUSCULOSKELETAL: No joint pain or arthritis.   NEUROLOGIC: No tingling, numbness, weakness.  PSYCHIATRY: No anxiety or depression.     DRUG ALLERGIES:  No Known Allergies  VITALS:  Blood pressure 95/76, pulse (!) 111, temperature 98 F (36.7 C), temperature source Oral, resp. rate 18, height 6\' 4"  (1.93 m), weight 79.3 kg, SpO2 100 %.  PHYSICAL EXAMINATION:  GENERAL:  63 y.o.-year-old patient lying in the bed with no acute distress.  EYES: Pupils equal, round, reactive to light and accommodation. No scleral icterus.HEENT: Head atraumatic, normocephalic. Oropharynx and nasopharynx clear.  NECK:  Supple, no jugular venous distention. No thyroid enlargement, no tenderness.  LUNGS: Normal breath sounds bilaterally, no wheezing, rales,rhonchi or crepitation. No use of accessory muscles of respiration.  CARDIOVASCULAR: S1, S2 normal. No murmurs, rubs, or gallops.  ABDOMEN: Soft, nontender, nondistended. Bowel sounds present. No organomegaly or mass.  EXTREMITIES: Dressing in place to site of left foot amputation.    NEUROLOGIC: Cranial nerves II through XII are intact. Muscle strength 4/5 in all extremities. Sensation intact. Gait not checked.  PSYCHIATRIC: The patient is alert and oriented x 2.   SKIN: No obvious rash, lesion, or ulcer.    LABORATORY PANEL:   CBC Recent Labs  Lab 10/25/18 0452  WBC 10.5  HGB 9.5*  HCT 28.5*  PLT 259   ------------------------------------------------------------------------------------------------------------------  Chemistries  Recent Labs  Lab 10/23/18 0553 10/24/18 0423  NA 136 134*  K 3.8 4.3  CL 103 99  CO2 29 30  GLUCOSE 176* 205*  BUN 10 12  CREATININE 0.59* 0.70  CALCIUM 8.6* 8.8*  MG 2.1  --    ------------------------------------------------------------------------------------------------------------------  Cardiac Enzymes No results for input(s): TROPONINI in the last 168 hours. ------------------------------------------------------------------------------------------------------------------  RADIOLOGY:  No results found.  ASSESSMENT AND PLAN:   Active Problems:   Diabetic foot infection (HCC)  1. 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  S/p left lower extremity angiogram 12/26. 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 - On IV antibiotics with IV Zosyn.  Infectious disease specialist recommends additional 3 weeks of IV antibiotics with Zosyn to complete treatment duration on discharge to skilled nursing facility.. - Pain control Patient evaluated by vascular surgery. Now s/p Arteriogram and catheter directed tPA and mechanical thrombectomy L SFA, popliteal arteries and tibioperoneal trunk. Patient reported to have had thrombolytic therapy recently PICC line already placed for long-term IV antibiotics x3 weeks of IV Zosyn at skilled nursing facility Continue current care may need further debridement tomorrow  2. Uncontrolled type 2 diabetes- recent A1c 8.1%. -Continue Lantus 15 units nightly and SSI -Blood sugars currently stable  3. History of DVT- unclear when this occurred Continue IV heparin Eliquis on hold for possible surgery  4.  Peripheral vascular disease- s/p angiogram  12/26 by Dr. Wyn Quakerew.  Now s/p Arteriogram and catheter directed tPA and mechanical thrombectomy L SFA, popliteal arteries and tibioperoneal trunk. Patient may still eventually require a femoral to distal bypass or an amputation and risk of limb loss is extremely high per documentation from Dr.  Wyn Quakerew on 10/21/2018. Outpatient vascular follow-up   5. Hyperlipidemia-stable -Continue home Lipitor  6.  Acute metabolic encephalopathy. Patient doing much better likely due to underlying infection and high-dose morphine  DVT prophylaxis; IV heparin CODE STATUS: Full.  Disposition; patient to go to skilled nursing facility with total of 3 weeks of IV antibiotic  TOTAL TIME TAKING CARE OF THIS PATIENT: 35 minutes.   Disposition; pending surgical decision for further treatment for the leg   Auburn BilberryShreyang Cayetano Mikita M.D on 10/25/2018   Between 7am to 6pm - Pager - [phone]   After 6pm go to [email or web address] - password [email or web address]  [facility] [place] [facility]  Office  [phone]  CC: Primary care physician; [name], [name], MD  Note: This dictation was prepared with Dragon dictation along with smaller phrase technology. Any transcriptional errors that result from this process are unintentional.

## 2018-10-25 NOTE — Progress Notes (Signed)
ANTICOAGULATION CONSULT NOTE -  Pharmacy Consult for Heparin Drip Indication: DVT- pt on apixaban- likely surgery on Monday 10/26/2018 and MD changing to Heparin  No Known Allergies  Patient Measurements: Height: 6\' 4"  (193 cm) Weight: 174 lb 13.2 oz (79.3 kg) IBW/kg (Calculated) : 86.8 Heparin Dosing Weight: 79.3 kg  Vital Signs: Temp: 98 F (36.7 C) (01/19 0508) Temp Source: Oral (01/19 0508) BP: 95/76 (01/19 0508) Pulse Rate: 111 (01/19 0508)  Labs: Recent Labs    10/23/18 0553  10/23/18 2028 10/24/18 0423  10/24/18 1746 10/25/18 0039 10/25/18 0452 10/25/18 1136  HGB 8.5*  --   --  9.2*  --   --   --  9.5*  --   HCT 25.4*  --   --  28.1*  --   --   --  28.5*  --   PLT 211  --   --  253  --   --   --  259  --   APTT  --    < > 35 51*   < > 53* 72* 76*  --   LABPROT  --   --  14.7  --   --   --   --   --   --   INR  --   --  1.16  --   --   --   --   --   --   HEPARINUNFRC  --    < > 1.34* 0.67  --   --   --  0.44 0.29*  CREATININE 0.59*  --   --  0.70  --   --   --   --   --    < > = values in this interval not displayed.    Estimated Creatinine Clearance: 106 mL/min (by C-G formula based on SCr of 0.7 mg/dL).   Medical History: Past Medical History:  Diagnosis Date  . Diabetes (HCC)   . DVT (deep venous thrombosis) (HCC)     Medications:  Scheduled:  . ALPRAZolam  0.5 mg Oral TID  . atorvastatin  80 mg Oral q1800  . diclofenac sodium  2 g Topical QID  . heparin  1,200 Units Intravenous Once  . insulin aspart  0-15 Units Subcutaneous TID WC  . insulin aspart  0-5 Units Subcutaneous QHS  . insulin glargine  15 Units Subcutaneous QHS  . multivitamin-lutein  1 capsule Oral Daily  . nicotine  21 mg Transdermal Daily  . ENSURE MAX PROTEIN  11 oz Oral BID  . sodium chloride flush  3 mL Intravenous Q12H   Infusions:  . sodium chloride Stopped (10/24/18 0417)  . heparin 1,500 Units/hr (10/25/18 0722)  . 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 Podiatry recommended watching patient over the weekend, patient may need further amputation of the foot. Last dose of apixaban given inpatient on 10/23/2018 at ~1000. Hgb 8.5  Plt 211   APTTand Heparin level scheduled for 2000.  01/18 @ 0500 aPTT 51 seconds - subtherapeutic; HL 0.61 not correlating. Will rebolus w/ heparin 1200 units IV x 1 and increase rate to 1250 units/hr and will recheck aPTT @ 1100 w/ HL check w/ am labs. CBC low stable will continue to monitor. 01/19 @ 0039 aPTT 72 seconds therapeutic. Will continue current rate and will recheck aPTT w/ am labs w/ HL and will continue f/u w/ CBC.  Goal of Therapy:  Heparin level 0.3-0.7 units/ml aPTT 66-102 seconds Monitor platelets by anticoagulation protocol: Yes   Plan:  1/19 1136 HL= 0.29. Will order bolus of 1200 units and increase Heparin drip to 1650 units/hr. Will recheck HL in 6 hours.  Hgb 9.5  Plt 259  Bari Mantis PharmD Clinical Pharmacist 10/25/2018

## 2018-10-25 NOTE — Progress Notes (Signed)
5 Days Post-Op   Subjective/Chief Complaint: Patient seen.  Does not really relate much pain with his left foot.   Objective: Vital signs in last 24 hours: Temp:  [98 F (36.7 C)-98.2 F (36.8 C)] 98 F (36.7 C) (01/19 0508) Pulse Rate:  [111] 111 (01/19 0508) Resp:  [18-20] 18 (01/19 0508) BP: (95-140)/(75-76) 95/76 (01/19 0508) SpO2:  [100 %] 100 % (01/19 0508) Last BM Date: 10/21/18  Intake/Output from previous day: 01/18 0701 - 01/19 0700 In: 360 [P.O.:360] Out: 2155 [Urine:2155] Intake/Output this shift: Total I/O In: 543 [P.O.:540; I.V.:3] Out: 0   Only mild bleeding and drainage on the bandaging.  Upon removal there is some progressive dehiscence of the central aspect of the wound with progressive necrosing of the plantar flap centrally as well as the deeper tissues through the open wound.  Lab Results:  Recent Labs    10/24/18 0423 10/25/18 0452  WBC 11.1* 10.5  HGB 9.2* 9.5*  HCT 28.1* 28.5*  PLT 253 259   BMET Recent Labs    10/23/18 0553 10/24/18 0423  NA 136 134*  K 3.8 4.3  CL 103 99  CO2 29 30  GLUCOSE 176* 205*  BUN 10 12  CREATININE 0.59* 0.70  CALCIUM 8.6* 8.8*   PT/INR Recent Labs    10/23/18 2028  LABPROT 14.7  INR 1.16   ABG No results for input(s): PHART, HCO3 in the last 72 hours.  Invalid input(s): PCO2, PO2  Studies/Results: No results found.  Anti-infectives: Anti-infectives (From admission, onward)   Start     Dose/Rate Route Frequency Ordered Stop   10/20/18 1030  ceFAZolin (ANCEF) IVPB 2g/100 mL premix     2 g 200 mL/hr over 30 Minutes Intravenous  Once 10/20/18 1026 10/20/18 1832   10/19/18 1400  clindamycin (CLEOCIN) IVPB 300 mg     300 mg 100 mL/hr over 30 Minutes Intravenous  Once 10/19/18 1353 10/19/18 1620   10/15/18 1800  vancomycin (VANCOCIN) 1,500 mg in sodium chloride 0.9 % 500 mL IVPB  Status:  Discontinued     1,500 mg 250 mL/hr over 120 Minutes Intravenous Every 12 hours 10/14/18 1918 10/18/18 0909    10/14/18 1800  vancomycin (VANCOCIN) 2,000 mg in sodium chloride 0.9 % 500 mL IVPB     2,000 mg 250 mL/hr over 120 Minutes Intravenous  Once 10/14/18 1712 10/14/18 2020   10/14/18 1800  piperacillin-tazobactam (ZOSYN) IVPB 3.375 g     3.375 g 12.5 mL/hr over 240 Minutes Intravenous Every 8 hours 10/14/18 1712        Assessment/Plan: s/p Procedure(s): LOWER EXTREMITY ANGIOGRAPHY (Left) Assessment: Continued gangrenous changes status post TMA with severe peripheral vascular disease.   Plan: The wound was repacked with sterile saline gauze followed by a bulky wrap.  Discussed with the patient that at this point it does not appear to be healing and we will need to discuss with vascular surgery options including any further intervention versus amputation.  I will plan to speak with Dr. Wyn Quaker tomorrow.  LOS: 11 days    Austin [name] 10/25/2018

## 2018-10-26 LAB — CBC
HCT: 26.4 % — ABNORMAL LOW (ref 39.0–52.0)
Hemoglobin: 8.8 g/dL — ABNORMAL LOW (ref 13.0–17.0)
MCH: 28.5 pg (ref 26.0–34.0)
MCHC: 33.3 g/dL (ref 30.0–36.0)
MCV: 85.4 fL (ref 80.0–100.0)
Platelets: 249 10*3/uL (ref 150–400)
RBC: 3.09 MIL/uL — ABNORMAL LOW (ref 4.22–5.81)
RDW: 12.7 % (ref 11.5–15.5)
WBC: 9.4 10*3/uL (ref 4.0–10.5)
nRBC: 0 % (ref 0.0–0.2)

## 2018-10-26 LAB — HEPARIN LEVEL (UNFRACTIONATED)
Heparin Unfractionated: 0.42 IU/mL (ref 0.30–0.70)
Heparin Unfractionated: 0.42 IU/mL (ref 0.30–0.70)

## 2018-10-26 LAB — GLUCOSE, CAPILLARY
GLUCOSE-CAPILLARY: 411 mg/dL — AB (ref 70–99)
Glucose-Capillary: 240 mg/dL — ABNORMAL HIGH (ref 70–99)
Glucose-Capillary: 190 mg/dL — ABNORMAL HIGH (ref 70–99)
Glucose-Capillary: 270 mg/dL — ABNORMAL HIGH (ref 70–99)

## 2018-10-26 MED ORDER — INSULIN ASPART 100 UNIT/ML ~~LOC~~ SOLN
4.0000 [IU] | Freq: Three times a day (TID) | SUBCUTANEOUS | Status: DC
  Administered 2018-10-26 – 2018-10-27 (×4): 4 [IU] via SUBCUTANEOUS
  Filled 2018-10-26 (×5): qty 1

## 2018-10-26 MED ORDER — INSULIN GLARGINE 100 UNIT/ML ~~LOC~~ SOLN
20.0000 [IU] | Freq: Every day | SUBCUTANEOUS | Status: DC
  Administered 2018-10-26 – 2018-10-27 (×2): 20 [IU] via SUBCUTANEOUS
  Filled 2018-10-26 (×3): qty 0.2

## 2018-10-26 MED ORDER — VITAMIN C 500 MG PO TABS
250.0000 mg | ORAL_TABLET | Freq: Two times a day (BID) | ORAL | Status: DC
  Administered 2018-10-26 – 2018-10-30 (×8): 250 mg via ORAL
  Filled 2018-10-26 (×8): qty 1

## 2018-10-26 MED ORDER — NICOTINE 14 MG/24HR TD PT24
14.0000 mg | MEDICATED_PATCH | Freq: Every day | TRANSDERMAL | Status: DC
  Administered 2018-10-26 – 2018-10-30 (×4): 14 mg via TRANSDERMAL
  Filled 2018-10-26 (×4): qty 1

## 2018-10-26 NOTE — Progress Notes (Signed)
ANTICOAGULATION CONSULT NOTE -  Pharmacy Consult for Heparin Drip Indication: DVT- pt on apixaban- likely surgery on Monday 10/26/2018 and MD changing to Heparin  No Known Allergies  Patient Measurements: Height: 6\' 4"  (193 cm) Weight: 174 lb 13.2 oz (79.3 kg) IBW/kg (Calculated) : 86.8 Heparin Dosing Weight: 79.3 kg  Vital Signs: Temp: 98 F (36.7 C) (01/20 0413) Temp Source: Oral (01/20 0413) BP: 100/63 (01/20 0413) Pulse Rate: 113 (01/20 0413)  Labs: Recent Labs    10/23/18 2028  10/24/18 0423  10/24/18 1746 10/25/18 0039 10/25/18 0452  10/25/18 1745 10/25/18 2357 10/26/18 0423  HGB  --    < > 9.2*  --   --   --  9.5*  --   --  8.8*  --   HCT  --   --  28.1*  --   --   --  28.5*  --   --  26.4*  --   PLT  --   --  253  --   --   --  259  --   --  249  --   APTT 35  --  51*   < > 53* 72* 76*  --   --   --   --   LABPROT 14.7  --   --   --   --   --   --   --   --   --   --   INR 1.16  --   --   --   --   --   --   --   --   --   --   HEPARINUNFRC 1.34*  --  0.67  --   --   --  0.44   < > 0.42 0.42 0.42  CREATININE  --   --  0.70  --   --   --   --   --   --   --   --    < > = values in this interval not displayed.    Estimated Creatinine Clearance: 106 mL/min (by C-G formula based on SCr of 0.7 mg/dL).   Medical History: Past Medical History:  Diagnosis Date  . Diabetes (HCC)   . DVT (deep venous thrombosis) (HCC)     Medications:  Scheduled:  . ALPRAZolam  0.5 mg Oral TID  . atorvastatin  80 mg Oral q1800  . diclofenac sodium  2 g Topical QID  . insulin aspart  0-15 Units Subcutaneous TID WC  . insulin aspart  0-5 Units Subcutaneous QHS  . insulin glargine  15 Units Subcutaneous QHS  . multivitamin-lutein  1 capsule Oral Daily  . nicotine  21 mg Transdermal Daily  . ENSURE MAX PROTEIN  11 oz Oral BID  . sodium chloride flush  3 mL Intravenous Q12H   Infusions:  . sodium chloride Stopped (10/26/18 0211)  . heparin 1,650 Units/hr (10/26/18 0620)  .  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 Podiatry recommended watching patient over the weekend, patient may need further amputation of the foot. Last dose of apixaban given inpatient on 10/23/2018 at ~1000. Hgb 8.5  Plt 211   APTTand Heparin level scheduled for 2000.  01/18 @ 0500 aPTT 51 seconds - subtherapeutic; HL 0.61 not correlating. Will rebolus w/ heparin 1200 units IV x 1 and increase rate to 1250 units/hr and will recheck  aPTT @ 1100 w/ HL check w/ am labs. CBC low stable will continue to monitor. 01/19 @ 0039 aPTT 72 seconds therapeutic. Will continue current rate and will recheck aPTT w/ am labs w/ HL and will continue f/u w/ CBC.   Goal of Therapy:  Heparin level 0.3-0.7 units/ml aPTT 66-102 seconds Monitor platelets by anticoagulation protocol: Yes   Plan:  01/20 @ 0500 HL 0.42 therapeutic. Will continue current rate and will recheck w/ am labs. hgb trending down will continue to monitor.  Thomasene Ripple, PharmD, BCPS Clinical Pharmacist 10/26/2018

## 2018-10-26 NOTE — Progress Notes (Addendum)
[facility] - [place] at [facility]   PATIENT NAME: Austin [name]    MR#:  [id number]  DATE OF BIRTH:  [birth year]  SUBJECTIVE:  CHIEF COMPLAINT:  Continues to complain of pain in the leg   REVIEW OF SYSTEMS:  CONSTITUTIONAL: No fever, fatigue or weakness.  EYES: No blurred or double vision.  EARS, NOSE, AND THROAT: No tinnitus or ear pain.  RESPIRATORY: No cough, shortness of breath, wheezing or hemoptysis.  CARDIOVASCULAR: No chest pain, orthopnea, edema.  GASTROINTESTINAL: No nausea, vomiting, diarrhea or abdominal pain.  GENITOURINARY: No dysuria, hematuria.  ENDOCRINE: No polyuria, nocturia,  HEMATOLOGY: No anemia, easy bruising or bleeding SKIN: No rash or lesion. MUSCULOSKELETAL: No joint pain or arthritis.   NEUROLOGIC: No tingling, numbness, weakness.  PSYCHIATRY: No anxiety or depression.     DRUG ALLERGIES:  No Known Allergies  VITALS:  Blood pressure 100/63, pulse (!) 113, temperature 98 F (36.7 C), temperature source Oral, resp. rate 16, height 6\' 4"  (1.93 m), weight 78.2 kg, SpO2 97 %.  PHYSICAL EXAMINATION:  GENERAL:  63 y.o.-year-old patient lying in the bed with no acute distress.  EYES: Pupils equal, round, reactive to light and accommodation. No scleral icterus.HEENT: Head atraumatic, normocephalic. Oropharynx and nasopharynx clear.  NECK:  Supple, no jugular venous distention. No thyroid enlargement, no tenderness.  LUNGS: Normal breath sounds bilaterally, no wheezing, rales,rhonchi or crepitation. No use of accessory muscles of respiration.  CARDIOVASCULAR: S1, S2 normal. No murmurs, rubs, or gallops.  ABDOMEN: Soft, nontender, nondistended. Bowel sounds present. No organomegaly or mass.  EXTREMITIES: Dressing in place to site of left foot amputation.    NEUROLOGIC: Cranial nerves II through XII are intact. Muscle strength 4/5 in all extremities. Sensation intact. Gait not checked.  PSYCHIATRIC: The patient is alert and oriented x 2.   SKIN: No obvious rash, lesion, or ulcer.    LABORATORY PANEL:   CBC Recent Labs  Lab 10/25/18 2357  WBC 9.4  HGB 8.8*  HCT 26.4*  PLT 249   ------------------------------------------------------------------------------------------------------------------  Chemistries  Recent Labs  Lab 10/23/18 0553 10/24/18 0423  NA 136 134*  K 3.8 4.3  CL 103 99  CO2 29 30  GLUCOSE 176* 205*  BUN 10 12  CREATININE 0.59* 0.70  CALCIUM 8.6* 8.8*  MG 2.1  --    ------------------------------------------------------------------------------------------------------------------  Cardiac Enzymes No results for input(s): TROPONINI in the last 168 hours. ------------------------------------------------------------------------------------------------------------------  RADIOLOGY:  No results found.  ASSESSMENT AND PLAN:   Active Problems:   Diabetic foot infection (HCC)  1. 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  S/p left lower extremity angiogram 12/26. 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 - On IV antibiotics with IV Zosyn.  Infectious disease specialist recommends additional 3 weeks of IV antibiotics with Zosyn to complete treatment duration on discharge to skilled nursing facility.. - Pain control Patient evaluated by vascular surgery. Now s/p Arteriogram and catheter directed tPA and mechanical thrombectomy L SFA, popliteal arteries and tibioperoneal trunk. Patient reported to have had thrombolytic therapy recently PICC line already placed for long-term IV antibiotics x3 weeks of IV Zosyn at skilled nursing facility Dr. Graciela Husbands of podiatry will discuss the case with vascular surgery to determine what further surgical intervention needs to be done   2. Uncontrolled type 2 diabetes- recent A1c 8.1%. -Increase Lantus to 20 units we will add pre-meal scheduled insulin -Blood sugars currently stable  3. History of  DVT- unclear when this  occurred Continue IV heparin Eliquis on hold for possible surgery  4. Peripheral vascular disease- s/p angiogram 12/26 by Dr. Wyn Quakerew.  Now s/p Arteriogram and catheter directed tPA and mechanical thrombectomy L SFA, popliteal arteries and tibioperoneal trunk. Patient may still eventually require a femoral to distal bypass or an amputation and risk of limb loss is extremely high per documentation from Dr.  Wyn Quakerew on 10/21/2018. Outpatient vascular follow-up   5. Hyperlipidemia-stable -Continue home Lipitor  6.  Acute metabolic encephalopathy. Now improved  DVT prophylaxis; IV heparin CODE STATUS: Full.  Disposition; patient to go to skilled nursing facility with total of 3 weeks of IV antibiotic  TOTAL TIME TAKING CARE OF THIS PATIENT: 35 minutes.   Disposition: Pending podiatry and vascular surgery intervention expect patient to remain in the hospital for the next few days    Auburn BilberryShreyang Leveon Pelzer M.D on 10/26/2018   Between 7am to 6pm - Pager - [phone]   After 6pm go to [email or web address] - password [email or web address]  [facility] [place] [facility]  Office  [phone]  CC: Primary care physician; [name], [name], MD  Note: This dictation was prepared with Dragon dictation along with smaller phrase technology. Any transcriptional errors that result from this process are unintentional.

## 2018-10-26 NOTE — Progress Notes (Signed)
Physical Therapy Treatment Patient Details Name: Austin [name] MRN: [id number] DOB: 1956-10-02 Today's Date: 10/26/2018    History of Present Illness 63 y.o. male with a known history of type 2 diabetes and history DVT who was directly admitted from the podiatry office for persistent left lower extremity diabetic foot infection.  He underwent debridement and first and second toe amputations on 09/30/2018.  He has been on antibiotics as an outpatient, but his wound has continued to worsen.  Underwent left lower extremity angiogram on 10/01/2018 with angioplasty and stent placement.  He denies any fevers or chills.  He endorses some mild left foot pain.  No lower extremity edema. He underwent L transmetatarsal amputation 10/16/18.     PT Comments    Patient asking to go to the bathroom upon entering room. Nursing student present. Patient reports pain in left LE. Reports he will need amputation planned for Wednesday. Assisted patient to [facility] via pivot transfer with +2 assist. Attempted standing with RW to get back to bed, patient unable to stand. Unable to maintain L NWB. Unable to effectively use RIght LE or B UEs to stand upright. Patient assisted with +2 to pivot to bed. Patient is unsafe with transfers at this time. Will benefit from RLE strengthening to improve standing ability.      Follow Up Recommendations  SNF     Equipment Recommendations  Rolling walker with 5" wheels;Wheelchair (measurements PT)    Recommendations for Other Services       Precautions / Restrictions Precautions Precautions: Fall Restrictions Weight Bearing Restrictions: Yes LLE Weight Bearing: Non weight bearing Other Position/Activity Restrictions: NWB Left LE    Mobility  Bed Mobility Overal bed mobility: Needs Assistance Bed Mobility: Supine to Sit;Sit to Supine;Rolling Rolling: Min assist   Supine to sit: Mod assist Sit to supine: Min assist      Transfers Overall transfer level: Needs  assistance Equipment used: Rolling walker (2 wheeled) Transfers: Sit to/from [employer] Sit to Stand: +2 physical assistance;Total assist Stand pivot transfers: Total assist Squat pivot transfers: Total assist     General transfer comment: patient keeps right LE flexed in standing, unable to maintain NWB on left and unable to use UEs effectively to assist for transfer.   Ambulation/Gait                 Stairs             Wheelchair Mobility    Modified Rankin (Stroke Patients Only)       Balance Overall balance assessment: Needs assistance Sitting-balance support: Single extremity supported;Feet supported Sitting balance-Leahy Scale: Poor Sitting balance - Comments: patient putting forefoot on floor when seated on BSC despite cues. Unable to maintain upright posture leaning forward.    Standing balance support: Bilateral upper extremity supported Standing balance-Leahy Scale: Zero Standing balance comment: patient requiring +2 total assist for standing with walker. Unable to get fully standing.                            Cognition Arousal/Alertness: Awake/alert Behavior During Therapy: WFL for tasks assessed/performed;Impulsive Overall Cognitive Status: Within Functional Limits for tasks assessed                                 General Comments: shows good effort follow instructions, at times in inconsistent      Exercises Other Exercises  Other Exercises: supine AAexercises right LE: SLR, heel slides, hip abd/add x 10 reps each    General Comments        Pertinent Vitals/Pain Pain Assessment: Faces Faces Pain Scale: Hurts little more Pain Descriptors / Indicators: Constant;Aching;Discomfort Pain Intervention(s): Monitored during session    Home Living                      Prior Function            PT Goals (current goals can now be found in the care plan section) Acute Rehab PT  Goals Patient Stated Goal: To return home and to regular daily activity as soon as possible. PT Goal Formulation: With patient Time For Goal Achievement: 10/31/18 Potential to Achieve Goals: Fair Progress towards PT goals: Not progressing toward goals - comment(patient having increased difficulty with mobility since evaluation.)    Frequency    Min 2X/week      PT Plan Current plan remains appropriate    Co-evaluation              AM-PAC PT "6 Clicks" Mobility   Outcome Measure  Help needed turning from your back to your side while in a flat bed without using bedrails?: A Lot Help needed moving from lying on your back to sitting on the side of a flat bed without using bedrails?: A Lot Help needed moving to and from a bed to a chair (including a wheelchair)?: Total Help needed standing up from a chair using your arms (e.g., wheelchair or bedside chair)?: Total Help needed to walk in hospital room?: Total Help needed climbing 3-5 steps with a railing? : Total 6 Click Score: 8    End of Session Equipment Utilized During Treatment: Gait belt Activity Tolerance: Patient limited by fatigue;Patient limited by pain Patient left: in bed;with call bell/phone within reach;with bed alarm set Nurse Communication: Mobility status PT Visit Diagnosis: Unsteadiness on feet (R26.81);Other abnormalities of gait and mobility (R26.89);Muscle weakness (generalized) (M62.81);Pain Pain - Right/Left: Left Pain - part of body: Ankle and joints of foot     Time: 1330-1350 PT Time Calculation (min) (ACUTE ONLY): 20 min  Charges:  $Therapeutic Activity: 8-22 mins                     Charidy Cappelletti, PT, GCS 10/26/18,2:20 PM

## 2018-10-26 NOTE — Care Management Important Message (Signed)
Copy of signed Medicare IM left with patient in room. 

## 2018-10-26 NOTE — Progress Notes (Signed)
[facility] Daily Progress Note   Subjective: 6 Days Post-Op: Left lower extremity angiogram, Catheter placement into left posterior tibial artery from right femoral approach, Mechanical thrombectomy to the left SFA, popliteal artery, tibioperoneal trunk, and posterior tibial arteries with the penumbra cat 6 device, Percutaneous transluminal angioplasty ofposterior tibial artery and tibioperoneal trunk with 3 mm diameter by 30 cm length angioplasty balloon, Viabahn covered stent placement to the left popliteal artery with 6 mm diameter by 15 cm length stent for thrombus and stenosis in the left popliteal artery in the distal portion of the previous stents and just below the previous stents, Viabahn covered stent placement to the left tibioperoneal trunk and distal popliteal artery with a 5 mm diameter by 7.5 cm length stent for residual thrombus and stenosis after angioplasty with Star[employer] closure device rightfemoral artery.  Patient with continued left lower extremity pain.   Objective: Vitals:   10/25/18 2117 10/26/18 0413 10/26/18 0822 10/26/18 1131  BP: 100/73 100/63  109/70  Pulse: 67 (!) 113  (!) 119  Resp: 19 16  18   Temp: 98.2 F (36.8 C) 98 F (36.7 C)  97.6 F (36.4 C)  TempSrc: Oral Oral  Oral  SpO2: 96% 97%  97%  Weight:   78.2 kg   Height:        Intake/Output Summary (Last 24 hours) at 10/26/2018 1348 Last data filed at 10/26/2018 1000 Gross per 24 hour  Intake 1689.99 ml  Output 1675 ml  Net 14.99 ml   Physical Exam: A&Ox3, NAD CV: RRR Pulmonary: CTA Bilaterally Abdomen: Soft, Nontender, Nondistended Vascular:  Left Lower Extremity: Dehiscence of central aspect of wound. Gangrenous plantar flap.    Laboratory: CBC    Component Value Date/Time   WBC 9.4 10/25/2018 2357   HGB 8.8 (L) 10/25/2018 2357   HGB 14.1 11/17/2013 0621   HCT 26.4 (L) 10/25/2018 2357   HCT 40.1 11/17/2013 0621   PLT 249 10/25/2018 2357   PLT 218 11/17/2013  0621   BMET    Component Value Date/Time   NA 134 (L) 10/24/2018 0423   NA 135 (L) 11/17/2013 0621   K 4.3 10/24/2018 0423   K 4.3 11/17/2013 0621   CL 99 10/24/2018 0423   CL 99 11/17/2013 0621   CO2 30 10/24/2018 0423   CO2 31 11/17/2013 0621   GLUCOSE 205 (H) 10/24/2018 0423   GLUCOSE 172 (H) 11/17/2013 0621   BUN 12 10/24/2018 0423   BUN 10 11/17/2013 0621   CREATININE 0.70 10/24/2018 0423   CREATININE 0.85 11/17/2013 0621   CALCIUM 8.8 (L) 10/24/2018 0423   CALCIUM 9.6 11/17/2013 0621   GFRNONAA >60 10/24/2018 0423   GFRNONAA >60 11/17/2013 0621   GFRAA >60 10/24/2018 0423   GFRAA >60 11/17/2013 1610   Assessment/Planning: The patient is a 63 year old male with severe PAD failing TMA to the LLE 1) Had long discussion with patient about failing TMA and need for further amputation.  2) Patient understands his TMA is not viable and a BKA would remove this non-viable tissues, eventually improve his continued pain and allow him to walk in the near future. 3) The patient is in agreement and is willing to move forward with a left BKA 4) This is planned on Wed with Dr. Wyn Quaker  Discussed with Dr. Wallis Mart Stegmayer PA-C 10/26/2018 1:48 PM

## 2018-10-26 NOTE — Progress Notes (Signed)
Order received to change nicoderm to 14mg 

## 2018-10-26 NOTE — Progress Notes (Signed)
Nutrition Follow Up Note   DOCUMENTATION CODES:   Non-severe (moderate) malnutrition in context of chronic illness  INTERVENTION:   Ensure Max protein supplement BID, each supplement provides 150kcal and 30g of protein.  Provide Ocuvite daily for wound healing (provides zinc, vitamin A, vitamin C, Vitamin E, copper, and selenium).  Vitamin C 250mg  po BID   NUTRITION DIAGNOSIS:   Moderate Malnutrition related to chronic illness(uncontrolled IDDM) as evidenced by moderate to severe fat depletions, moderate to severe muscle depletions.  GOAL:   Patient will meet greater than or equal to 90% of their needs  -progressing   MONITOR:   PO intake, Supplement acceptance, Labs, Weight trends, Skin, I & O's  ASSESSMENT:   63 y/o male admitted with IDDM, PVD, DVT admitted with left diabetic foot wound-s/pdebridement and first and second ray amputation on 12/25, left lower extremity angiogram 12/26 and transmetatarsal amputation 10/16/18   Pt doing well; eating 100% of meals and drinking Ensure Max. Per Podiatry note, "it does not appear to be healing and we will need to discuss with vascular surgery options including any further intervention versus amputation." Will add vitamin C supplementation. No new weight since 1/13; will request new weight. Recommend continue supplements and vitamins after discharge until healing complete.    Medications reviewed and include: insulin, ocuvite, nicotine, zosyn, heparin   Labs reviewed: Hgb 8.8(L), Hct 26.4(L) cbgs- 181, 241, 282, 240, 240 x 24 hrs  Diet Order:   Diet Order            Diet Carb Modified Fluid consistency: Thin; Room service appropriate? Yes  Diet effective now             EDUCATION NEEDS:   Education needs have been addressed  Skin:  Skin Assessment: Reviewed RN Assessment(incision L foot )  Last BM:  1/18- type 4  Height:   Ht Readings from Last 1 Encounters:  10/19/18 6\' 4"  (1.93 m)   Weight:   Wt Readings  from Last 1 Encounters:  10/19/18 79.3 kg   Ideal Body Weight:  91.8 kg  BMI:  Body mass index is 21.28 kg/m.  Estimated Nutritional Needs:   Kcal:  2200-2500kcal/day   Protein:  102-118g/day   Fluid:  >2.2L/day   Betsey Holiday MS, RD, LDN Pager #- [phone] Office#- [phone] After Hours Pager: [phone]

## 2018-10-26 NOTE — Progress Notes (Signed)
Pharmacy Antibiotic Note  Austin [name] is a 63 y.o. male admitted on 10/14/2018 with Wound Infection.  Pharmacy has been consulted for  pip/tazo dosing.  Necrotic infection of L foot - s/p transmetatarsal amputation on 10/16/18.  Culture positive for Pseudomonas and Proteus.  Plan:  Day 13- Continue Zosyn 3.375g IV q8h (4 hour infusion). Per ID note: continue thru 11/06/2018   Height: 6\' 4"  (193 cm) Weight: 174 lb 13.2 oz (79.3 kg) IBW/kg (Calculated) : 86.8  Temp (24hrs), Avg:98.1 F (36.7 C), Min:98 F (36.7 C), Max:98.2 F (36.8 C)  Recent Labs  Lab 10/20/18 0515 10/21/18 0424 10/23/18 0553 10/24/18 0423 10/25/18 0452 10/25/18 2357  WBC 13.4* 15.7* 11.2* 11.1* 10.5 9.4  CREATININE 1.05 0.94 0.59* 0.70  --   --     Estimated Creatinine Clearance: 106 mL/min (by C-G formula based on SCr of 0.7 mg/dL).    No Known Allergies  Antimicrobials this admission: Zosyn 1/8  >>  Vanocmycin 1/8 >> 1/12  Clindamycin 1/13 x 1  Cefazolin 1/14 x 1   Dose adjustments this admission: none  Microbiology results: 1/9 MRSA PCR: negative  1/10 Left foot: pseudomonas aeruginosa/ proteus vulgaris   Thank you for allowing pharmacy to be a part of this patient's care.  Angelique Blonder, PharmD Clinical Pharmacist 10/26/2018 9:19 AM

## 2018-10-27 DIAGNOSIS — T8744 Infection of amputation stump, left lower extremity: Secondary | ICD-10-CM

## 2018-10-27 DIAGNOSIS — L84 Corns and callosities: Secondary | ICD-10-CM

## 2018-10-27 DIAGNOSIS — Z794 Long term (current) use of insulin: Secondary | ICD-10-CM

## 2018-10-27 LAB — BASIC METABOLIC PANEL
ANION GAP: 8 (ref 5–15)
BUN: 17 mg/dL (ref 8–23)
CALCIUM: 9.2 mg/dL (ref 8.9–10.3)
CO2: 27 mmol/L (ref 22–32)
Chloride: 100 mmol/L (ref 98–111)
Creatinine, Ser: 0.73 mg/dL (ref 0.61–1.24)
GFR calc Af Amer: >60 mL/min (ref >60)
Glucose, Bld: 284 mg/dL — ABNORMAL HIGH (ref 70–99)
Potassium: 4.4 mmol/L (ref 3.5–5.1)
Sodium: 135 mmol/L (ref 135–145)

## 2018-10-27 LAB — GLUCOSE, CAPILLARY
Glucose-Capillary: 248 mg/dL — ABNORMAL HIGH (ref 70–99)
Glucose-Capillary: 223 mg/dL — ABNORMAL HIGH (ref 70–99)
Glucose-Capillary: 305 mg/dL — ABNORMAL HIGH (ref 70–99)

## 2018-10-27 LAB — CBC
HCT: 28.8 % — ABNORMAL LOW (ref 39.0–52.0)
Hemoglobin: 9.1 g/dL — ABNORMAL LOW (ref 13.0–17.0)
MCH: 28.3 pg (ref 26.0–34.0)
MCHC: 31.6 g/dL (ref 30.0–36.0)
MCV: 89.4 fL (ref 80.0–100.0)
NRBC: 0 % (ref 0.0–0.2)
Platelets: 263 10*3/uL (ref 150–400)
RBC: 3.22 MIL/uL — ABNORMAL LOW (ref 4.22–5.81)
RDW: 12.9 % (ref 11.5–15.5)
WBC: 8.9 10*3/uL (ref 4.0–10.5)

## 2018-10-27 LAB — HEPARIN LEVEL (UNFRACTIONATED): Heparin Unfractionated: 0.4 IU/mL (ref 0.30–0.70)

## 2018-10-27 MED ORDER — SODIUM CHLORIDE 0.9 % IV SOLN
INTRAVENOUS | Status: DC
  Administered 2018-10-28 (×2): via INTRAVENOUS

## 2018-10-27 NOTE — Clinical Social Work Note (Signed)
Patient to have amputation tomorrow. CSW has updated [name] as they will need re-auth. York Spaniel MSW,LCSW [phone]

## 2018-10-27 NOTE — Progress Notes (Signed)
ANTICOAGULATION CONSULT NOTE -  Pharmacy Consult for Heparin Drip Indication: DVT- pt on apixaban- likely surgery on Monday 10/26/2018 and MD changing to Heparin  No Known Allergies  Patient Measurements: Height: 6\' 4"  (193 cm) Weight: 172 lb 4.8 oz (78.2 kg) IBW/kg (Calculated) : 86.8 Heparin Dosing Weight: 79.3 kg  Vital Signs: Temp: 98.2 F (36.8 C) (01/21 0436) Temp Source: Oral (01/21 0436) BP: 113/76 (01/21 0436) Pulse Rate: 101 (01/21 0436)  Labs: Recent Labs    10/24/18 1746 10/25/18 0039  10/25/18 0452  10/25/18 2357 10/26/18 0423 10/27/18 0312  HGB  --   --    < > 9.5*  --  8.8*  --  9.1*  HCT  --   --   --  28.5*  --  26.4*  --  28.8*  PLT  --   --   --  259  --  249  --  263  APTT 53* 72*  --  76*  --   --   --   --   HEPARINUNFRC  --   --   --  0.44   < > 0.42 0.42 0.40  CREATININE  --   --   --   --   --   --   --  0.73   < > = values in this interval not displayed.    Estimated Creatinine Clearance: 104.5 mL/min (by C-G formula based on SCr of 0.73 mg/dL).   Medical History: Past Medical History:  Diagnosis Date  . Diabetes (HCC)   . DVT (deep venous thrombosis) (HCC)     Medications:  Scheduled:  . ALPRAZolam  0.5 mg Oral TID  . atorvastatin  80 mg Oral q1800  . diclofenac sodium  2 g Topical QID  . insulin aspart  0-15 Units Subcutaneous TID WC  . insulin aspart  0-5 Units Subcutaneous QHS  . insulin aspart  4 Units Subcutaneous TID WC  . insulin glargine  20 Units Subcutaneous QHS  . multivitamin-lutein  1 capsule Oral Daily  . nicotine  14 mg Transdermal Daily  . ENSURE MAX PROTEIN  11 oz Oral BID  . sodium chloride flush  3 mL Intravenous Q12H  . vitamin C  250 mg Oral BID   Infusions:  . sodium chloride Stopped (10/27/18 0227)  . heparin 1,650 Units/hr (10/27/18 0312)  . piperacillin-tazobactam (ZOSYN)  IV 12.5 mL/hr at 10/27/18 9528    Assessment: 63 yo Male s/p  transmetatarsal amputation 10/16/18 then s/p arteriogram/tPA,  etc. Podiatry recommended watching patient over the weekend, patient may need further amputation of the foot. Last dose of apixaban given inpatient on 10/23/2018 at ~1000. Hgb 8.5  Plt 211   APTTand Heparin level scheduled for 2000.  01/18 @ 0500 aPTT 51 seconds - subtherapeutic; HL 0.61 not correlating. Will rebolus w/ heparin 1200 units IV x 1 and increase rate to 1250 units/hr and will recheck aPTT @ 1100 w/ HL check w/ am labs. CBC low stable will continue to monitor. 01/19 @ 0039 aPTT 72 seconds therapeutic. Will continue current rate and will recheck aPTT w/ am labs w/ HL and will continue f/u w/ CBC.   Goal of Therapy:  Heparin level 0.3-0.7 units/ml aPTT 66-102 seconds Monitor platelets by anticoagulation protocol: Yes   Plan:  01/20 @ 0500 HL 0.42 therapeutic. Will continue current rate and will recheck w/ am labs. hgb trending down will continue to monitor.  1/21 AM heparin level 0.40. Continue current regimen. Recheck heparin  level and CBC with tomorrow AM labs.  Fulton ReekMatt Alecxander Mainwaring, PharmD, BCPS  10/27/18 5:34 AM

## 2018-10-27 NOTE — Progress Notes (Signed)
Date of Admission:  10/14/2018      ID: Austin [name] is a 63 y.o. male  Active Problems:   Diabetic foot infection ([place]) PAD   Subjective: Doing better No fever or chills No confusion  Medications:  . ALPRAZolam  0.5 mg Oral TID  . atorvastatin  80 mg Oral q1800  . diclofenac sodium  2 g Topical QID  . insulin aspart  0-15 Units Subcutaneous TID WC  . insulin aspart  0-5 Units Subcutaneous QHS  . insulin aspart  4 Units Subcutaneous TID WC  . insulin glargine  20 Units Subcutaneous QHS  . multivitamin-lutein  1 capsule Oral Daily  . nicotine  14 mg Transdermal Daily  . ENSURE MAX PROTEIN  11 oz Oral BID  . sodium chloride flush  3 mL Intravenous Q12H  . vitamin C  250 mg Oral BID    Objective: Vital signs in last 24 hours: Temp:  [97.4 F (36.3 C)-98.6 F (37 C)] 97.4 F (36.3 C) (01/21 1300) Pulse Rate:  [91-101] 100 (01/21 1300) Resp:  [16-20] 16 (01/21 1300) BP: (102-113)/(66-76) 102/66 (01/21 1300) SpO2:  [100 %] 100 % (01/21 1300)  PHYSICAL EXAM:  General: Alert, cooperative, no distress, appears stated age.  Head: Normocephalic, without obvious abnormality, atraumatic. Eyes: Conjunctivae clear, anicteric sclerae. Pupils are equal ENT Nares normal. No drainage or sinus tenderness. Lips, mucosa, and tongue normal. No Thrush Neck: Supple, symmetrical, no adenopathy, thyroid: non tender no carotid bruit and no JVD. Back: No CVA tenderness. Lungs: Clear to auscultation bilaterally. No Wheezing or Rhonchi. No rales. Heart: Regular rate and rhythm, no murmur, rub or gallop. Abdomen: Soft, non-tender,not distended. Bowel sounds normal. No masses Extremities:left foot dressing not removed Rt foot- callus on the left 5th met head area Skin: No rashes or lesions. Or bruising Lymph: Cervical, supraclavicular normal. Neurologic: Grossly non-focal  Lab Results CBC Latest Ref Rng & Units 10/27/2018 10/25/2018 10/25/2018  WBC 4.0 - 10.5 K/uL 8.9 9.4 10.5   Hemoglobin 13.0 - 17.0 g/dL 9.1(L) 8.8(L) 9.5(L)  Hematocrit 39.0 - 52.0 % 28.8(L) 26.4(L) 28.5(L)  Platelets 150 - 400 K/uL 263 249 259   CMP Latest Ref Rng & Units 10/27/2018 10/24/2018 10/23/2018  Glucose 70 - 99 mg/dL 284(H) 205(H) 176(H)  BUN 8 - 23 mg/dL '17 12 10  ' Creatinine 0.61 - 1.24 mg/dL 0.73 0.70 0.59(L)  Sodium 135 - 145 mmol/L 135 134(L) 136  Potassium 3.5 - 5.1 mmol/L 4.4 4.3 3.8  Chloride 98 - 111 mmol/L 100 99 103  CO2 22 - 32 mmol/L '27 30 29  ' Calcium 8.9 - 10.3 mg/dL 9.2 8.8(L) 8.6(L)  Total Protein 6.5 - 8.1 g/dL - - -  Total Bilirubin 0.3 - 1.2 mg/dL - - -  Alkaline Phos 38 - 126 U/L - - -  AST 15 - 41 U/L - - -  ALT 0 - 44 U/L - - -   Microbiology: 1/10 left foot wound culture- proteus and pseudomonas    Assessment/Plan:  62 y.o.malewith a history ofDM ,peripheral vascular disease, chronicleft foot infection was admitted with worsening necrotic wound of the left foot on 10/14/18.   Necrotic infection of the left foot at the amputation site now status post transmetatarsal amputation on 10/16/18. Culture positive for Pseudomonas and Proteus. on zosyn,  he is going for  BKA  tomorrow  zosyn can be stopped on 10/30/18    .Peripheral arterial disease:h/oof angioplasty andstentsto the left leg.  on 1/14 had extensive procedure  including mechanical thrombectomy, stent placement following thrombolysis On Plavix  Encephalopathy resolved Diabetes mellitus: on lantus Management as per primary team  Smoker: counseled about quitting especially in light of PAD  Discussed the management with patient and his family at bedside ID will sign off- call if needed

## 2018-10-27 NOTE — Progress Notes (Signed)
[facility] [place] [facility]   PATIENT NAME: Austin [name]    MR#:  [id number]  DATE OF BIRTH:  04/20/1956  SUBJECTIVE:  No acute events overnight.  Patient is plan for BKA tomorrow.  REVIEW OF SYSTEMS:    Review of Systems  Constitutional: Negative for fever, chills weight loss HENT: Negative for ear pain, nosebleeds, congestion, facial swelling, rhinorrhea, neck pain, neck stiffness and ear discharge.   Respiratory: Negative for cough, shortness of breath, wheezing  Cardiovascular: Negative for chest pain, palpitations and leg swelling.  Gastrointestinal: Negative for heartburn, abdominal pain, vomiting, diarrhea or consitpation Genitourinary: Negative for dysuria, urgency, frequency, hematuria Musculoskeletal: Negative for back pain or joint pain Neurological: Negative for dizziness, seizures, syncope, focal weakness,  numbness and headaches.  Hematological: Does not bruise/bleed easily.  Psychiatric/Behavioral: Negative for hallucinations, confusion, dysphoric mood    Tolerating Diet: yes      DRUG ALLERGIES:  No Known Allergies  VITALS:  Blood pressure 113/76, pulse (!) 101, temperature 98.2 F (36.8 C), temperature source Oral, resp. rate 20, height 6\' 4"  (1.93 m), weight 78.2 kg, SpO2 100 %.  PHYSICAL EXAMINATION:  Constitutional: Appears well-developed and well-nourished. No distress. HENT: Normocephalic. Marland Kitchen. Oropharynx is clear and moist.  Eyes: Conjunctivae and EOM are normal. PERRLA, no scleral icterus.  Neck: Normal ROM. Neck supple. No JVD. No tracheal deviation. CVS: RRR, S1/S2 +, no murmurs, no gallops, no carotid bruit.  Pulmonary: Effort and breath sounds normal, no stridor, rhonchi, wheezes, rales.  Abdominal: Soft. BS +,  no distension, tenderness, rebound or guarding.  Musculoskeletal: Left lower extremity dehiscence of central aspect of wound with gangrenous plantar flap Dressing in place  Neuro: Alert. CN 2-12 grossly  intact. No focal deficits. Skin: Skin is warm and dry. No rash noted. Psychiatric: Normal mood and affect.      LABORATORY PANEL:   CBC Recent Labs  Lab 10/27/18 0312  WBC 8.9  HGB 9.1*  HCT 28.8*  PLT 263   ------------------------------------------------------------------------------------------------------------------  Chemistries  Recent Labs  Lab 10/23/18 0553  10/27/18 0312  NA 136   < > 135  K 3.8   < > 4.4  CL 103   < > 100  CO2 29   < > 27  GLUCOSE 176*   < > 284*  BUN 10   < > 17  CREATININE 0.59*   < > 0.73  CALCIUM 8.6*   < > 9.2  MG 2.1  --   --    < > = values in this interval not displayed.   ------------------------------------------------------------------------------------------------------------------  Cardiac Enzymes No results for input(s): TROPONINI in the last 168 hours. ------------------------------------------------------------------------------------------------------------------  RADIOLOGY:  No results found.   ASSESSMENT AND PLAN:   63 year old male with history of uncontrolled diabetes and peripheral vascular disease who presented with left diabetic foot wound.  1.  Left diabetic foot wound: Patient seen and evaluated by podiatry and vascular surgery.  Patient is status post transmetatarsal amputation. Postoperative day #6 mechanical thrombectomy to left SFA, popliteal artery, tibioperoneal trunk and posterior tibial arteries  Plan for BKA on Wednesday as TMA is not viable Needs 3 weeks total of IV ZOsyn as per culture results and ID consult Patient has PICC line  2.  Uncontrolled type 2 diabetes with A1c of 8.1: Continue ADA diet with sliding scale and Lantus  3.  History of DVT: Continue IV heparin ( eliquis on hold for now)  4.Tobacco dependence: Patient is encouraged to quit smoking.  Counseling was provided for 4 minutes.   5.  Hyperlipidemia: Continue statin.     Management plans discussed with the patient and he  is in agreement.  CODE STATUS: full  TOTAL TIME TAKING CARE OF THIS PATIENT: 27 minutes.     POSSIBLE D/C 3-5 days to SNF, DEPENDING ON CLINICAL CONDITION.   Azai Gaffin M.D on 10/27/2018 at 11:00 AM  Between 7am to 6pm - Pager - [phone] After 6pm go to [email or web address] - password [email or web address]  [facility]  Office  [phone]  CC: Primary care physician; Preston Fleeting, MD  Note: This dictation was prepared with Dragon dictation along with smaller phrase technology. Any transcriptional errors that result from this process are unintentional.

## 2018-10-27 NOTE — Progress Notes (Signed)
Inpatient Diabetes Program Recommendations  AACE/ADA: New Consensus Statement on Inpatient Glycemic Control (2015)  Target Ranges:  Prepandial:   less than 140 mg/dL      Peak postprandial:   less than 180 mg/dL (1-2 hours)      Critically ill patients:  140 - 180 mg/dL   Results for OBERON, HAWLEY (MRN [id number]) as of 10/27/2018 12:55  Ref. Range 10/26/2018 07:34 10/26/2018 11:25 10/26/2018 16:23 10/26/2018 21:30  Glucose-Capillary Latest Ref Range: 70 - 99 mg/dL 160 (H)  5 units NOVOLOG  411 (H) 190 (H)  7 units NOVOLOG  270 (H)  3 units NOVOLOG +  20 units LANTUS   Results for HERBERTH, MANUELE (MRN [id number]) as of 10/27/2018 12:55  Ref. Range 10/27/2018 07:24 10/27/2018 11:30  Glucose-Capillary Latest Ref Range: 70 - 99 mg/dL 485 (H)  9 units NOVOLOG 223 (H)    Home DM Meds: Lantus 15 units Daily  Current Orders: Lantus 20 units QHS      Novolog Moderate Correction Scale/ SSI (0-15 units) TID AC + HS      Novolog 4 units TID with meals      Note patient will be NPO tomorrow AM for BKA.  CBGs still running high despite upward adjustments made to Insulins yesterday.     MD- Please consider the following in-hospital insulin adjustments:  1. Increase Lantus to 25 units QHS  2. Increase Novolog Meal Coverage to: Novolog 6 units TID with meals  (Please add the following Hold Parameters: Hold if pt eats <50% of meal, Hold if pt NPO)       --Will follow patient during hospitalization--  [name] RN, MSN, CDE Diabetes Coordinator Inpatient Glycemic Control Team Team Pager: [phone] (8a-5p)

## 2018-10-27 NOTE — Progress Notes (Signed)
Physical Therapy Treatment Patient Details Name: Austin [name] MRN: [id number] DOB: 11-22-1955 Today's Date: 10/27/2018    History of Present Illness 63 y.o. male with a known history of type 2 diabetes and history DVT who was directly admitted from the podiatry office for persistent left lower extremity diabetic foot infection.  He underwent debridement and first and second toe amputations on 09/30/2018.  He has been on antibiotics as an outpatient, but his wound has continued to worsen.  Underwent left lower extremity angiogram on 10/01/2018 with angioplasty and stent placement.  He denies any fevers or chills.  He endorses some mild left foot pain.  No lower extremity edema. He underwent L transmetatarsal amputation 10/16/18.     PT Comments    Patient received in the bed, moaning in pain. Patient reports 10/10 pain in Left LE. Reports he recently received Pain meds. Patient agreeable to performing exercises in bed. Patient requires assistance with exercises to perform full motion. Patient limited by pain this visit, therefore mobility not performed this day. Patient will continue to benefit from skilled PT to address his weakness, decreased ability with transfers, decreased ambulation.       Follow Up Recommendations  SNF     Equipment Recommendations  Rolling walker with 5" wheels;Wheelchair (measurements PT)    Recommendations for Other Services       Precautions / Restrictions Precautions Precautions: Fall Restrictions Weight Bearing Restrictions: Yes LLE Weight Bearing: Non weight bearing Other Position/Activity Restrictions: NWB Left LE    Mobility  Bed Mobility               General bed mobility comments: deferred  Transfers                 General transfer comment: deferred  Ambulation/Gait             General Gait Details: deferred due to high pain level   Stairs             Wheelchair Mobility    Modified Rankin (Stroke Patients  Only)       Balance                                            Cognition Arousal/Alertness: Lethargic Behavior During Therapy: WFL for tasks assessed/performed Overall Cognitive Status: Within Functional Limits for tasks assessed                                 General Comments: patient preoccupied by pain this visit.       Exercises General Exercises - Lower Extremity Ankle Circles/Pumps: AAROM;15 reps;Right;Supine Short Arc Quad: AAROM;10 reps;Right;Supine Heel Slides: AAROM;10 reps;Right;Supine Hip ABduction/ADduction: AAROM;10 reps;Right;Supine Straight Leg Raises: AROM;10 reps;Right;Supine    General Comments        Pertinent Vitals/Pain Pain Assessment: 0-10 Pain Score: 10-Worst pain ever Pain Location: L foot Pain Descriptors / Indicators: Aching;Constant;Discomfort;Nagging;Operative site guarding Pain Intervention(s): Monitored during session;Repositioned;Premedicated before session    Home Living                      Prior Function            PT Goals (current goals can now be found in the care plan section) Acute Rehab PT Goals Patient Stated Goal: To return home and  to regular daily activity as soon as possible. PT Goal Formulation: With patient Time For Goal Achievement: 10/31/18 Potential to Achieve Goals: Fair Progress towards PT goals: Not progressing toward goals - comment(patient most likely having additional sx tomorrow.)    Frequency    Min 2X/week      PT Plan Current plan remains appropriate    Co-evaluation              AM-PAC PT "6 Clicks" Mobility   Outcome Measure  Help needed turning from your back to your side while in a flat bed without using bedrails?: A Lot Help needed moving from lying on your back to sitting on the side of a flat bed without using bedrails?: A Lot Help needed moving to and from a bed to a chair (including a wheelchair)?: Total Help needed standing up from  a chair using your arms (e.g., wheelchair or bedside chair)?: Total Help needed to walk in hospital room?: Total Help needed climbing 3-5 steps with a railing? : Total 6 Click Score: 8    End of Session   Activity Tolerance: Patient limited by fatigue;Patient limited by pain Patient left: in bed;with call bell/phone within reach;with bed alarm set Nurse Communication: Mobility status PT Visit Diagnosis: Muscle weakness (generalized) (M62.81);Pain Pain - Right/Left: Left Pain - part of body: Ankle and joints of foot     Time: 1155-1208 PT Time Calculation (min) (ACUTE ONLY): 13 min  Charges:  $Therapeutic Exercise: 8-22 mins                     Evart Mcdonnell, PT, GCS 10/27/18,12:26 PM

## 2018-10-28 ENCOUNTER — Inpatient Hospital Stay: Payer: Medicare Other | Admitting: Anesthesiology

## 2018-10-28 ENCOUNTER — Encounter
Admission: AD | Disposition: A | Discharge status: Skilled Nursing Facility | Payer: Self-pay | Source: Home / Self Care | Attending: Internal Medicine

## 2018-10-28 ENCOUNTER — Encounter: Payer: Self-pay | Admitting: Anesthesiology

## 2018-10-28 DIAGNOSIS — I96 Gangrene, not elsewhere classified: Secondary | ICD-10-CM

## 2018-10-28 HISTORY — PX: AMPUTATION: SHX166

## 2018-10-28 LAB — BASIC METABOLIC PANEL
ANION GAP: 7 (ref 5–15)
BUN: 18 mg/dL (ref 8–23)
CO2: 28 mmol/L (ref 22–32)
Calcium: 9.4 mg/dL (ref 8.9–10.3)
Chloride: 100 mmol/L (ref 98–111)
Creatinine, Ser: 0.69 mg/dL (ref 0.61–1.24)
GFR calc Af Amer: >60 mL/min (ref >60)
GFR calc non Af Amer: >60 mL/min (ref >60)
Glucose, Bld: 195 mg/dL — ABNORMAL HIGH (ref 70–99)
POTASSIUM: 4.6 mmol/L (ref 3.5–5.1)
Sodium: 135 mmol/L (ref 135–145)

## 2018-10-28 LAB — CBC
HCT: 27.8 % — ABNORMAL LOW (ref 39.0–52.0)
Hemoglobin: 9 g/dL — ABNORMAL LOW (ref 13.0–17.0)
MCH: 28.1 pg (ref 26.0–34.0)
MCHC: 32.4 g/dL (ref 30.0–36.0)
MCV: 86.9 fL (ref 80.0–100.0)
NRBC: 0 % (ref 0.0–0.2)
Platelets: 263 10*3/uL (ref 150–400)
RBC: 3.2 MIL/uL — ABNORMAL LOW (ref 4.22–5.81)
RDW: 13.2 % (ref 11.5–15.5)
WBC: 9.8 10*3/uL (ref 4.0–10.5)

## 2018-10-28 LAB — MAGNESIUM: Magnesium: 1.9 mg/dL (ref 1.7–2.4)

## 2018-10-28 LAB — GLUCOSE, CAPILLARY
Glucose-Capillary: 210 mg/dL — ABNORMAL HIGH (ref 70–99)
Glucose-Capillary: 166 mg/dL — ABNORMAL HIGH (ref 70–99)
Glucose-Capillary: 138 mg/dL — ABNORMAL HIGH (ref 70–99)
Glucose-Capillary: 128 mg/dL — ABNORMAL HIGH (ref 70–99)
Glucose-Capillary: 192 mg/dL — ABNORMAL HIGH (ref 70–99)
Glucose-Capillary: 292 mg/dL — ABNORMAL HIGH (ref 70–99)

## 2018-10-28 LAB — PROTIME-INR
INR: 1.1
Prothrombin Time: 14.1 seconds (ref 11.4–15.2)

## 2018-10-28 LAB — HEPARIN LEVEL (UNFRACTIONATED): Heparin Unfractionated: 0.37 IU/mL (ref 0.30–0.70)

## 2018-10-28 SURGERY — AMPUTATION BELOW KNEE
Anesthesia: General | Laterality: Left

## 2018-10-28 MED ORDER — LIDOCAINE HCL (PF) 2 % IJ SOLN
INTRAMUSCULAR | Status: AC
  Filled 2018-10-28: qty 10

## 2018-10-28 MED ORDER — ACETAMINOPHEN 10 MG/ML IV SOLN
INTRAVENOUS | Status: AC
  Filled 2018-10-28: qty 100

## 2018-10-28 MED ORDER — ESMOLOL HCL 100 MG/10ML IV SOLN
INTRAVENOUS | Status: DC | PRN
  Administered 2018-10-28: 20 mg via INTRAVENOUS

## 2018-10-28 MED ORDER — MIDAZOLAM HCL 2 MG/2ML IJ SOLN
INTRAMUSCULAR | Status: AC
  Filled 2018-10-28: qty 2

## 2018-10-28 MED ORDER — ONDANSETRON HCL 4 MG/2ML IJ SOLN
INTRAMUSCULAR | Status: AC
  Filled 2018-10-28: qty 2

## 2018-10-28 MED ORDER — ROCURONIUM BROMIDE 100 MG/10ML IV SOLN
INTRAVENOUS | Status: DC | PRN
  Administered 2018-10-28: 50 mg via INTRAVENOUS

## 2018-10-28 MED ORDER — GLYCOPYRROLATE 0.2 MG/ML IJ SOLN
INTRAMUSCULAR | Status: AC
  Filled 2018-10-28: qty 1

## 2018-10-28 MED ORDER — PROPOFOL 10 MG/ML IV BOLUS
INTRAVENOUS | Status: DC | PRN
  Administered 2018-10-28 (×2): 50 mg via INTRAVENOUS

## 2018-10-28 MED ORDER — INSULIN GLARGINE 100 UNIT/ML ~~LOC~~ SOLN
25.0000 [IU] | Freq: Every day | SUBCUTANEOUS | Status: DC
  Administered 2018-10-28 – 2018-10-29 (×2): 25 [IU] via SUBCUTANEOUS
  Filled 2018-10-28 (×3): qty 0.25

## 2018-10-28 MED ORDER — ROCURONIUM BROMIDE 50 MG/5ML IV SOLN
INTRAVENOUS | Status: AC
  Filled 2018-10-28: qty 1

## 2018-10-28 MED ORDER — ONDANSETRON HCL 4 MG/2ML IJ SOLN
INTRAMUSCULAR | Status: DC | PRN
  Administered 2018-10-28: 4 mg via INTRAVENOUS

## 2018-10-28 MED ORDER — ACETAMINOPHEN 10 MG/ML IV SOLN
INTRAVENOUS | Status: DC | PRN
  Administered 2018-10-28: 1000 mg via INTRAVENOUS

## 2018-10-28 MED ORDER — SUCCINYLCHOLINE CHLORIDE 20 MG/ML IJ SOLN
INTRAMUSCULAR | Status: AC
  Filled 2018-10-28: qty 1

## 2018-10-28 MED ORDER — INSULIN ASPART 100 UNIT/ML ~~LOC~~ SOLN
6.0000 [IU] | Freq: Three times a day (TID) | SUBCUTANEOUS | Status: DC
  Administered 2018-10-28 – 2018-10-30 (×6): 6 [IU] via SUBCUTANEOUS
  Filled 2018-10-28 (×6): qty 1

## 2018-10-28 MED ORDER — FENTANYL CITRATE (PF) 100 MCG/2ML IJ SOLN
25.0000 ug | INTRAMUSCULAR | Status: DC | PRN
  Administered 2018-10-28 (×4): 25 ug via INTRAVENOUS

## 2018-10-28 MED ORDER — FENTANYL CITRATE (PF) 100 MCG/2ML IJ SOLN
INTRAMUSCULAR | Status: DC | PRN
  Administered 2018-10-28 (×2): 100 ug via INTRAVENOUS
  Administered 2018-10-28: 50 ug via INTRAVENOUS

## 2018-10-28 MED ORDER — CLOPIDOGREL BISULFATE 75 MG PO TABS
75.0000 mg | ORAL_TABLET | Freq: Every day | ORAL | Status: DC
  Administered 2018-10-29 – 2018-10-30 (×2): 75 mg via ORAL
  Filled 2018-10-28 (×2): qty 1

## 2018-10-28 MED ORDER — PHENYLEPHRINE HCL 10 MG/ML IJ SOLN
INTRAMUSCULAR | Status: DC | PRN
  Administered 2018-10-28: 100 ug via INTRAVENOUS
  Administered 2018-10-28 (×2): 50 ug via INTRAVENOUS

## 2018-10-28 MED ORDER — PROPOFOL 10 MG/ML IV BOLUS
INTRAVENOUS | Status: AC
  Filled 2018-10-28: qty 20

## 2018-10-28 MED ORDER — FENTANYL CITRATE (PF) 250 MCG/5ML IJ SOLN
INTRAMUSCULAR | Status: AC
  Filled 2018-10-28: qty 5

## 2018-10-28 MED ORDER — DEXAMETHASONE SODIUM PHOSPHATE 10 MG/ML IJ SOLN
INTRAMUSCULAR | Status: DC | PRN
  Administered 2018-10-28: 5 mg via INTRAVENOUS

## 2018-10-28 MED ORDER — SUCCINYLCHOLINE CHLORIDE 20 MG/ML IJ SOLN
INTRAMUSCULAR | Status: DC | PRN
  Administered 2018-10-28: 100 mg via INTRAVENOUS

## 2018-10-28 MED ORDER — METOPROLOL TARTRATE 5 MG/5ML IV SOLN
INTRAVENOUS | Status: DC | PRN
  Administered 2018-10-28: 1 mg via INTRAVENOUS
  Administered 2018-10-28 (×2): 2 mg via INTRAVENOUS

## 2018-10-28 MED ORDER — SUGAMMADEX SODIUM 500 MG/5ML IV SOLN
INTRAVENOUS | Status: AC
  Filled 2018-10-28: qty 5

## 2018-10-28 MED ORDER — ONDANSETRON HCL 4 MG/2ML IJ SOLN: 4.0000 mg | Freq: Once | INTRAMUSCULAR | Status: DC | PRN

## 2018-10-28 MED ORDER — DEXAMETHASONE SODIUM PHOSPHATE 10 MG/ML IJ SOLN
INTRAMUSCULAR | Status: AC
  Filled 2018-10-28: qty 1

## 2018-10-28 MED ORDER — FENTANYL CITRATE (PF) 100 MCG/2ML IJ SOLN
INTRAMUSCULAR | Status: AC
  Administered 2018-10-28: 25 ug via INTRAVENOUS
  Filled 2018-10-28: qty 2

## 2018-10-28 MED ORDER — LIDOCAINE HCL (CARDIAC) PF 100 MG/5ML IV SOSY
PREFILLED_SYRINGE | INTRAVENOUS | Status: DC | PRN
  Administered 2018-10-28: 100 mg via INTRAVENOUS

## 2018-10-28 MED ORDER — ESMOLOL HCL 100 MG/10ML IV SOLN
INTRAVENOUS | Status: AC
  Filled 2018-10-28: qty 10

## 2018-10-28 SURGICAL SUPPLY — 40 items
BANDAGE ELASTIC 6 LF NS (GAUZE/BANDAGES/DRESSINGS) ×3 IMPLANT
BLADE SAGITTAL 25.0X1.19X90 (BLADE) ×1 IMPLANT
BLADE SAGITTAL 25.0X1.19X90MM (BLADE) ×1
BLADE SAGITTAL WIDE XTHICK NO (BLADE) ×1 IMPLANT
BNDG COHESIVE 4X5 TAN STRL (GAUZE/BANDAGES/DRESSINGS) ×3 IMPLANT
BNDG GAUZE 4.5X4.1 6PLY STRL (MISCELLANEOUS) ×6 IMPLANT
BRUSH SCRUB EZ  4% CHG (MISCELLANEOUS) ×2
BRUSH SCRUB EZ 4% CHG (MISCELLANEOUS) ×1 IMPLANT
CANISTER SUCT 1200ML W/VALVE (MISCELLANEOUS) ×3 IMPLANT
COVER WAND RF STERILE (DRAPES) ×1 IMPLANT
DRAIN PENROSE 1/4X12 LTX (DRAIN) ×1 IMPLANT
DRAPE INCISE IOBAN 66X45 STRL (DRAPES) IMPLANT
DURAPREP 26ML APPLICATOR (WOUND CARE) ×3 IMPLANT
ELECT CAUTERY BLADE 6.4 (BLADE) ×3 IMPLANT
ELECT REM PT RETURN 9FT ADLT (ELECTROSURGICAL) ×3
ELECTRODE REM PT RTRN 9FT ADLT (ELECTROSURGICAL) ×1 IMPLANT
GAUZE PETRO XEROFOAM 1X8 (MISCELLANEOUS) ×6 IMPLANT
GLOVE BIO SURGEON STRL SZ7 (GLOVE) ×6 IMPLANT
GLOVE INDICATOR 7.5 STRL GRN (GLOVE) ×3 IMPLANT
GOWN STRL REUS W/ TWL LRG LVL3 (GOWN DISPOSABLE) ×2 IMPLANT
GOWN STRL REUS W/ TWL XL LVL3 (GOWN DISPOSABLE) ×1 IMPLANT
GOWN STRL REUS W/TWL LRG LVL3 (GOWN DISPOSABLE) ×2
GOWN STRL REUS W/TWL XL LVL3 (GOWN DISPOSABLE) ×2
HANDLE YANKAUER SUCT BULB TIP (MISCELLANEOUS) ×3 IMPLANT
KIT TURNOVER KIT A (KITS) ×3 IMPLANT
LABEL OR SOLS (LABEL) ×3 IMPLANT
NS IRRIG 1000ML POUR BTL (IV SOLUTION) ×3 IMPLANT
PACK EXTREMITY ARMC (MISCELLANEOUS) ×3 IMPLANT
PAD ABD DERMACEA PRESS 5X9 (GAUZE/BANDAGES/DRESSINGS) ×6 IMPLANT
PAD PREP 24X41 OB/GYN DISP (PERSONAL CARE ITEMS) ×3 IMPLANT
SPONGE LAP 18X18 RF (DISPOSABLE) ×3 IMPLANT
STAPLER SKIN PROX 35W (STAPLE) ×3 IMPLANT
STOCKINETTE M/LG 89821 (MISCELLANEOUS) ×3 IMPLANT
SUT SILK 2 0 (SUTURE) ×2
SUT SILK 2 0 SH (SUTURE) ×6 IMPLANT
SUT SILK 2-0 18XBRD TIE 12 (SUTURE) ×1 IMPLANT
SUT SILK 3 0 (SUTURE) ×2
SUT SILK 3-0 18XBRD TIE 12 (SUTURE) ×1 IMPLANT
SUT VIC AB 0 CT1 36 (SUTURE) ×8 IMPLANT
SUT VIC AB 2-0 CT1 (SUTURE) ×6 IMPLANT

## 2018-10-28 NOTE — Anesthesia Postprocedure Evaluation (Signed)
Anesthesia Post Note  Patient: Austin [name]  Procedure(s) Performed: AMPUTATION BELOW KNEE (Left )  Patient location during evaluation: PACU Anesthesia Type: General Level of consciousness: awake and alert Pain management: pain level controlled Vital Signs Assessment: post-procedure vital signs reviewed and stable Respiratory status: spontaneous breathing, nonlabored ventilation, respiratory function stable and patient connected to nasal cannula oxygen Cardiovascular status: blood pressure returned to baseline and stable Postop Assessment: no apparent nausea or vomiting Anesthetic complications: no     Last Vitals:  Vitals:   10/28/18 1447 10/28/18 1555  BP: 135/78 (!) 155/77  Pulse: 100 (!) 102  Resp: 13 16  Temp: (!) 36.4 C 36.5 C  SpO2: 100% 100%    Last Pain:  Vitals:   10/28/18 1555  TempSrc: Oral  PainSc:                  Benedetta Sundstrom S

## 2018-10-28 NOTE — Progress Notes (Signed)
[facility] - [place] [facility]   PATIENT NAME: Austin [name]    MR#:  [id number]  DATE OF BIRTH:  12/31/1955  SUBJECTIVE:   Patient planned for BKA today  REVIEW OF SYSTEMS:    Review of Systems  Constitutional: Negative for fever, chills weight loss HENT: Negative for ear pain, nosebleeds, congestion, facial swelling, rhinorrhea, neck pain, neck stiffness and ear discharge.   Respiratory: Negative for cough, shortness of breath, wheezing  Cardiovascular: Negative for chest pain, palpitations and leg swelling.  Gastrointestinal: Negative for heartburn, abdominal pain, vomiting, diarrhea or consitpation Genitourinary: Negative for dysuria, urgency, frequency, hematuria Musculoskeletal: Negative for back pain or joint pain Neurological: Negative for dizziness, seizures, syncope, focal weakness,  numbness and headaches.  Hematological: Does not bruise/bleed easily.  Psychiatric/Behavioral: Negative for hallucinations, confusion, dysphoric mood    Tolerating Diet: npo    DRUG ALLERGIES:  No Known Allergies  VITALS:  Blood pressure 118/70, pulse 89, temperature 97.9 F (36.6 C), temperature source Oral, resp. rate 20, height 6\' 4"  (1.93 m), weight 78.2 kg, SpO2 100 %.  PHYSICAL EXAMINATION:  Constitutional: Appears well-developed and well-nourished. No distress. HENT: Normocephalic. Marland Kitchen Oropharynx is clear and moist.  Eyes: Conjunctivae and EOM are normal. PERRLA, no scleral icterus.  Neck: Normal ROM. Neck supple. No JVD. No tracheal deviation. CVS: RRR, S1/S2 +, no murmurs, no gallops, no carotid bruit.  Pulmonary: Effort and breath sounds normal, no stridor, rhonchi, wheezes, rales.  Abdominal: Soft. BS +,  no distension, tenderness, rebound or guarding.  Musculoskeletal: Left lower extremity dehiscence of central aspect of wound with gangrenous plantar flap Dressing in place  Neuro: Alert. CN 2-12 grossly intact. No focal deficits. Skin: Skin is  warm and dry. No rash noted. Psychiatric: Normal mood and affect.      LABORATORY PANEL:   CBC Recent Labs  Lab 10/28/18 0551  WBC 9.8  HGB 9.0*  HCT 27.8*  PLT 263   ------------------------------------------------------------------------------------------------------------------  Chemistries  Recent Labs  Lab 10/28/18 0551  NA 135  K 4.6  CL 100  CO2 28  GLUCOSE 195*  BUN 18  CREATININE 0.69  CALCIUM 9.4  MG 1.9   ------------------------------------------------------------------------------------------------------------------  Cardiac Enzymes No results for input(s): TROPONINI in the last 168 hours. ------------------------------------------------------------------------------------------------------------------  RADIOLOGY:  No results found.   ASSESSMENT AND PLAN:   63 year old male with history of uncontrolled diabetes and peripheral vascular disease who presented with left diabetic foot wound.  1.  Left diabetic foot wound: Patient seen and evaluated by podiatry and vascular surgery.  Patient is status post transmetatarsal amputation. Postoperative day #8 mechanical thrombectomy to left SFA, popliteal artery, tibioperoneal trunk and posterior tibial arteries  Plan for BKA today as TMA is not viable Needs 3 weeks total of IV Zosyn as per culture results and ID consult END date 1/24 Patient has PICC line for IV ABX  2.  Uncontrolled type 2 diabetes with A1c of 8.1: Continue ADA diet with sliding scale and Lantus with NovoLog as per recommendations by diabetes nurse  3.  History of DVT: Continue IV heparin ( eliquis on hold for now) Probably can restart oral Eliquis tomorrow if okay with surgery.   4.Tobacco dependence: Patient is encouraged to quit smoking. Counseling was provided for 4 minutes.   5.  Hyperlipidemia: Continue statin.     Management plans discussed with the patient and he is in agreement.  CODE STATUS: full  TOTAL TIME  TAKING CARE OF THIS PATIENT: 24 minutes.  POSSIBLE D/C 3-5 days to SNF, DEPENDING ON CLINICAL CONDITION.   Zykeria Laguardia M.D on 10/28/2018 at 10:59 AM  Between 7am to 6pm - Pager - [phone] After 6pm go to [email or web address] - password [email or web address]  [facility]  Office  [phone]  CC: Primary care physician; [name], [name], MD  Note: This dictation was prepared with Dragon dictation along with smaller phrase technology. Any transcriptional errors that result from this process are unintentional.

## 2018-10-28 NOTE — Transfer of Care (Signed)
Immediate Anesthesia Transfer of Care Note  Patient: Austin [name]  Procedure(s) Performed: AMPUTATION BELOW KNEE (Left )  Patient Location: PACU  Anesthesia Type:General  Level of Consciousness: awake and patient cooperative, baseline status  Airway & Oxygen Therapy: Patient Spontanous Breathing and Patient connected to face mask oxygen  Post-op Assessment: Report given to RN and Post -op Vital signs reviewed and stable  Post vital signs: Reviewed and stable  Last Vitals:  Vitals Value Taken Time  BP 119/98 10/28/2018  1:27 PM  Temp 36.4 C 10/28/2018  1:24 PM  Pulse 101 10/28/2018  1:32 PM  Resp 17 10/28/2018  1:32 PM  SpO2 98 % 10/28/2018  1:32 PM  Vitals shown include unvalidated device data.  Last Pain:  Vitals:   10/28/18 1109  TempSrc: Tympanic  PainSc:       Patients Stated Pain Goal: 0 (10/25/18 1639)  Complications: No apparent anesthesia complications

## 2018-10-28 NOTE — Anesthesia Procedure Notes (Signed)
Procedure Name: Intubation Date/Time: 09/14/2019 12:51 PM Performed by: Kelton Pillar, CRNA Pre-anesthesia Checklist: Patient identified, Suction available, Patient being monitored and Emergency Drugs available Patient Re-evaluated:Patient Re-evaluated prior to induction Oxygen Delivery Method: Circle system utilized Preoxygenation: Pre-oxygenation with 100% oxygen Induction Type: IV induction Ventilation: Mask ventilation without difficulty and Oral airway inserted - appropriate to patient size Laryngoscope Size: Mac and 4 Grade View: Grade I Tube type: Oral Tube size: 7.5 mm Number of attempts: 1 Airway Equipment and Method: Stylet Placement Confirmation: ETT inserted through vocal cords under direct vision,  positive ETCO2,  CO2 detector and breath sounds checked- equal and bilateral Secured at: 22 cm Tube secured with: Tape Dental Injury: Teeth and Oropharynx as per pre-operative assessment

## 2018-10-28 NOTE — Anesthesia Post-op Follow-up Note (Signed)
Anesthesia QCDR form completed.        

## 2018-10-28 NOTE — Progress Notes (Signed)
Pharmacy Antibiotic Note  Austin [name] is a 63 y.o. male admitted on 10/14/2018 with Wound Infection.  Pharmacy has been consulted for  pip/tazo dosing.  Necrotic infection of L foot - s/p transmetatarsal amputation on 10/16/18.  Culture positive for Pseudomonas and Proteus.  Plan:  Day 13- Continue Zosyn 3.375g IV q8h (4 hour infusion). Per ID note: continue thru 10/30/2018 s/p BKA   Height: 6\' 4"  (193 cm) Weight: 172 lb 4.8 oz (78.2 kg) IBW/kg (Calculated) : 86.8  Temp (24hrs), Avg:97.6 F (36.4 C), Min:97.4 F (36.3 C), Max:97.9 F (36.6 C)  Recent Labs  Lab 10/23/18 0553 10/24/18 0423 10/25/18 0452 10/25/18 2357 10/27/18 0312 10/28/18 0551  WBC 11.2* 11.1* 10.5 9.4 8.9 9.8  CREATININE 0.59* 0.70  --   --  0.73 0.69    Estimated Creatinine Clearance: 104.5 mL/min (by C-G formula based on SCr of 0.69 mg/dL).    No Known Allergies  Antimicrobials this admission: Zosyn 1/8  >>  Vanocmycin 1/8 >> 1/12  Clindamycin 1/13 x 1  Cefazolin 1/14 x 1   Dose adjustments this admission: none  Microbiology results: 1/9 MRSA PCR: negative  1/10 Left foot: pseudomonas aeruginosa/ proteus vulgaris   Thank you for allowing pharmacy to be a part of this patient's care.  Orinda Kenner, PharmD Clinical Pharmacist 10/28/2018 10:10 AM

## 2018-10-28 NOTE — Progress Notes (Signed)
PT Cancellation Note  Patient Details Name: Austin [name] MRN: [id number] DOB: [birth year]   Cancelled Treatment:    Reason Eval/Treat Not Completed: Patient at procedure or test/unavailable.  Pt currently in surgery for BKA.  Will hold until pt is ready to participate with therapy again.  Pt will need a new order for evaluation.  Glenetta Hew, PT, DPT  Glenetta Hew 10/28/2018, 1:29 PM

## 2018-10-28 NOTE — H&P (Signed)
[facility] History & Physical Update  The patient was interviewed and re-examined.  [name] previous History and Physical has been reviewed and is unchanged.  There is no change in the plan of care. His TMA is non-salvageable at this point and a left BKA is needed to remove non-viable tissue. We plan to proceed with the scheduled procedure.  Festus Barren, MD  10/28/2018, 11:18 AM

## 2018-10-28 NOTE — Op Note (Signed)
   OPERATIVE NOTE   PROCEDURE: Left below-the-knee amputation  PRE-OPERATIVE DIAGNOSIS: Left foot gangrene  POST-OPERATIVE DIAGNOSIS: same as above  SURGEON: Festus Barren, MD  ASSISTANT(S): none  ANESTHESIA: general  ESTIMATED BLOOD LOSS: 150 cc  FINDING(S): none  SPECIMEN(S):  Left below-the-knee amputation  INDICATIONS:   Austin [name] is a 63 y.o. male who presents with left leg gangrene.  The patient is scheduled for a left below-the-knee amputation.  I discussed in depth with the patient the risks, benefits, and alternatives to this procedure.  The patient is aware that the risk of this operation included but are not limited to:  bleeding, infection, myocardial infarction, stroke, death, failure to heal amputation wound, and possible need for more proximal amputation.  The patient is aware of the risks and agrees proceed forward with the procedure.  DESCRIPTION:  After full informed written consent was obtained from the patient, the patient was brought back to the operating room, and placed supine upon the operating table.  Prior to induction, the patient received IV antibiotics.  The patient was then prepped and draped in the standard fashion for a below-the-knee amputation.  After obtaining adequate anesthesia, the patient was prepped and draped in the standard fashion for a left below-the-knee amputation.  I marked out the anterior incision two finger breadths below the tibial tuberosity and then the marked out a posterior flap that was one third of the circumference of the calf in length.   I made the incisions for these flaps, and then dissected through the subcutaneous tissue, fascia, and muscle anteriorly.  I elevated  the periosteal tissue superiorly so that the tibia was about 3-4 cm shorter than the anterior skin flap.  I then transected the tibia with a power saw and then took a wedge off the tibia anteriorly with the power saw.  Then I smoothed out the rough edges.  In a  similar fashion, I cut back the fibula about two centimeters higher than the level of the tibia with a bone cutter.  I put a bone hook into the distal tibia and then used a large amputation knife to sharply develop a tissue plane through the muscle along the fibula.  In such fashion, the posterior flap was developed.  At this point, the specimen was passed off the field as the below-the-knee amputation.  At this point, I clamped all visibly bleeding arteries and veins using a combination of suture ligation with Silk suture and electrocautery.  Bleeding continued to be controlled with electrocautery and suture ligature.  The stump was washed off with sterile normal saline and no further active bleeding was noted.  I reapproximated the anterior and posterior fascia  with interrupted stitches of 0 Vicryl.  This was completed along the entire length of anterior and posterior fascia until there were no more loose space in the fascial line. I then placed a layer of 2-0 Vicryl sutures in the subcutaneous tissue. The skin was then  reapproximated with staples.  The stump was washed off and dried.  The incision was dressed with Xeroform and  then fluffs were applied.  Kerlix was wrapped around the leg and then gently an ACE wrap was applied.    COMPLICATIONS: none  CONDITION: stable   Festus Barren  10/28/2018, 1:33 PM    This note was created with Dragon Medical transcription system. Any errors in dictation are purely unintentional.

## 2018-10-28 NOTE — Progress Notes (Signed)
ANTICOAGULATION CONSULT NOTE -  Pharmacy Consult for Heparin Drip Indication: DVT- pt on apixaban- likely surgery on Monday 10/26/2018 and MD changing to Heparin  No Known Allergies  Patient Measurements: Height: 6\' 4"  (193 cm) Weight: 172 lb 4.8 oz (78.2 kg) IBW/kg (Calculated) : 86.8 Heparin Dosing Weight: 79.3 kg  Vital Signs: Temp: 97.9 F (36.6 C) (01/22 0519) Temp Source: Oral (01/22 0519) BP: 118/70 (01/22 0519) Pulse Rate: 89 (01/22 0519)  Labs: Recent Labs    10/25/18 2357 10/26/18 0423 10/27/18 0312 10/28/18 0551  HGB 8.8*  --  9.1* 9.0*  HCT 26.4*  --  28.8* 27.8*  PLT 249  --  263 263  LABPROT  --   --   --  14.1  INR  --   --   --  1.10  HEPARINUNFRC 0.42 0.42 0.40 0.37  CREATININE  --   --  0.73 0.69    Estimated Creatinine Clearance: 104.5 mL/min (by C-G formula based on SCr of 0.69 mg/dL).   Medical History: Past Medical History:  Diagnosis Date  . Diabetes (HCC)   . DVT (deep venous thrombosis) (HCC)     Medications:  Scheduled:  . ALPRAZolam  0.5 mg Oral TID  . atorvastatin  80 mg Oral q1800  . diclofenac sodium  2 g Topical QID  . insulin aspart  0-15 Units Subcutaneous TID WC  . insulin aspart  0-5 Units Subcutaneous QHS  . insulin aspart  4 Units Subcutaneous TID WC  . insulin glargine  20 Units Subcutaneous QHS  . multivitamin-lutein  1 capsule Oral Daily  . nicotine  14 mg Transdermal Daily  . ENSURE MAX PROTEIN  11 oz Oral BID  . sodium chloride flush  3 mL Intravenous Q12H  . vitamin C  250 mg Oral BID   Infusions:  . sodium chloride Stopped (10/28/18 0536)  . sodium chloride Stopped (10/28/18 0554)  . heparin 1,650 Units/hr (10/28/18 0600)  . piperacillin-tazobactam (ZOSYN)  IV Stopped (10/28/18 0536)    Assessment: 63 yo Male s/p  transmetatarsal amputation 10/16/18 then s/p arteriogram/tPA, etc. Podiatry recommended watching patient over the weekend, patient may need further amputation of the foot. Last dose of apixaban  given inpatient on 10/23/2018 at ~1000. Hgb 8.5  Plt 211   APTTand Heparin level scheduled for 2000.  01/18 @ 0500 aPTT 51 seconds - subtherapeutic; HL 0.61 not correlating. Will rebolus w/ heparin 1200 units IV x 1 and increase rate to 1250 units/hr and will recheck aPTT @ 1100 w/ HL check w/ am labs. CBC low stable will continue to monitor. 01/19 @ 0039 aPTT 72 seconds therapeutic. Will continue current rate and will recheck aPTT w/ am labs w/ HL and will continue f/u w/ CBC.   Goal of Therapy:  Heparin level 0.3-0.7 units/ml aPTT 66-102 seconds Monitor platelets by anticoagulation protocol: Yes   Plan:  01/20 @ 0500 HL 0.42 therapeutic. Will continue current rate and will recheck w/ am labs. hgb trending down will continue to monitor.  1/21 AM heparin level 0.40. Continue current regimen. Recheck heparin level and CBC with tomorrow AM labs.  1/22 AM heparin level 0.37. Continue current regimen. Recheck heparin level and CBC with tomorrow AM labs.   Fulton Reek, PharmD, BCPS  10/28/18 7:04 AM

## 2018-10-28 NOTE — Anesthesia Preprocedure Evaluation (Signed)
Anesthesia Evaluation  Patient identified by MRN, date of birth, ID band Patient awake    Reviewed: Allergy & Precautions, NPO status , Patient's Chart, lab work & pertinent test results, reviewed documented beta blocker date and time   Airway Mallampati: III  TM Distance: >3 FB     Dental  (+) Chipped   Pulmonary Current Smoker,           Cardiovascular hypertension, Pt. on medications + dysrhythmias Atrial Fibrillation      Neuro/Psych    GI/Hepatic   Endo/Other  diabetes, Type 2  Renal/GU      Musculoskeletal   Abdominal   Peds  Hematology   Anesthesia Other Findings Hb 9.0. smokes. K 4.6.  Reproductive/Obstetrics                             Anesthesia Physical Anesthesia Plan  ASA: III  Anesthesia Plan: General   Post-op Pain Management:    Induction: Intravenous  PONV Risk Score and Plan:   Airway Management Planned: LMA  Additional Equipment:   Intra-op Plan:   Post-operative Plan:   Informed Consent: I have reviewed the patients History and Physical, chart, labs and discussed the procedure including the risks, benefits and alternatives for the proposed anesthesia with the patient or authorized representative who has indicated his/her understanding and acceptance.       Plan Discussed with: CRNA  Anesthesia Plan Comments:         Anesthesia Quick Evaluation

## 2018-10-29 DIAGNOSIS — Z89512 Acquired absence of left leg below knee: Secondary | ICD-10-CM

## 2018-10-29 LAB — BASIC METABOLIC PANEL
Anion gap: 7 (ref 5–15)
BUN: 19 mg/dL (ref 8–23)
CO2: 25 mmol/L (ref 22–32)
Calcium: 8.9 mg/dL (ref 8.9–10.3)
Chloride: 100 mmol/L (ref 98–111)
Creatinine, Ser: 0.74 mg/dL (ref 0.61–1.24)
GFR calc Af Amer: >60 mL/min (ref >60)
Glucose, Bld: 264 mg/dL — ABNORMAL HIGH (ref 70–99)
Potassium: 4.7 mmol/L (ref 3.5–5.1)
Sodium: 132 mmol/L — ABNORMAL LOW (ref 135–145)

## 2018-10-29 LAB — CBC
HCT: 25.2 % — ABNORMAL LOW (ref 39.0–52.0)
Hemoglobin: 8.1 g/dL — ABNORMAL LOW (ref 13.0–17.0)
MCH: 28.3 pg (ref 26.0–34.0)
MCHC: 32.1 g/dL (ref 30.0–36.0)
MCV: 88.1 fL (ref 80.0–100.0)
Platelets: 242 10*3/uL (ref 150–400)
RBC: 2.86 MIL/uL — ABNORMAL LOW (ref 4.22–5.81)
RDW: 13.7 % (ref 11.5–15.5)
WBC: 12.6 10*3/uL — ABNORMAL HIGH (ref 4.0–10.5)
nRBC: 0 % (ref 0.0–0.2)

## 2018-10-29 LAB — GLUCOSE, CAPILLARY
Glucose-Capillary: 215 mg/dL — ABNORMAL HIGH (ref 70–99)
Glucose-Capillary: 287 mg/dL — ABNORMAL HIGH (ref 70–99)
Glucose-Capillary: 237 mg/dL — ABNORMAL HIGH (ref 70–99)
Glucose-Capillary: 283 mg/dL — ABNORMAL HIGH (ref 70–99)

## 2018-10-29 LAB — HEPARIN LEVEL (UNFRACTIONATED): Heparin Unfractionated: <0.1 IU/mL — ABNORMAL LOW (ref 0.30–0.70)

## 2018-10-29 MED ORDER — SODIUM CHLORIDE 1 G PO TABS
2.0000 g | ORAL_TABLET | Freq: Two times a day (BID) | ORAL | Status: AC
  Administered 2018-10-29 – 2018-10-30 (×2): 2 g via ORAL
  Filled 2018-10-29 (×2): qty 2

## 2018-10-29 MED ORDER — BISACODYL 5 MG PO TBEC
5.0000 mg | DELAYED_RELEASE_TABLET | Freq: Every day | ORAL | Status: DC | PRN
  Administered 2018-10-30: 5 mg via ORAL
  Filled 2018-10-29: qty 1

## 2018-10-29 MED ORDER — KETOROLAC TROMETHAMINE 15 MG/ML IJ SOLN
15.0000 mg | Freq: Four times a day (QID) | INTRAMUSCULAR | Status: DC
  Administered 2018-10-29 – 2018-10-30 (×5): 15 mg via INTRAVENOUS
  Filled 2018-10-29 (×5): qty 1

## 2018-10-29 NOTE — Progress Notes (Signed)
[facility] Daily Progress Note  Subjective: 1 Day Post-Op: Left below-the-knee amputation  7 Days Post-Op: Left lower extremity angiogram,Catheter placement into left posterior tibial artery from right femoral approach,Mechanical thrombectomy to the left SFA, popliteal artery, tibioperoneal trunk, and posterior tibial arteries with the penumbra cat 6 device,Percutaneous transluminal angioplasty ofposterior tibial artery and tibioperoneal trunk with 3 mm diameter by 30 cm length angioplasty balloon,Viabahn covered stent placement to the left popliteal artery with 6 mm diameter by 15 cm length stent for thrombus and stenosis in the left popliteal artery in the distal portion of the previous stents and just below the previous stents,Viabahn covered stent placement to the left tibioperoneal trunk and distal popliteal artery with a 5 mm diameter by 7.5 cm length stent for residual thrombus and stenosis after angioplastywithStarClose closure device rightfemoral artery.  Objective: Vitals:   10/28/18 1447 10/28/18 1555 10/28/18 2037 10/29/18 0449  BP: 135/78 (!) 155/77 134/75 128/67  Pulse: 100 (!) 102 92 97  Resp: 13 16 16 19   Temp: (!) 97.5 F (36.4 C) 97.7 F (36.5 C) (!) 97.4 F (36.3 C) 98.3 F (36.8 C)  TempSrc: Oral Oral Oral Oral  SpO2: 100% 100% 100% 100%  Weight:      Height:        Intake/Output Summary (Last 24 hours) at 10/29/2018 0930 Last data filed at 10/29/2018 0449 Gross per 24 hour  Intake 1293.17 ml  Output 3150 ml  Net -1856.83 ml   Physical Exam: A&Ox3, NAD CV: RRR Pulmonary: CTA Bilaterally Abdomen: Soft, Nontender, Nondistended Vascular:  Left Lower Extremity: Thigh soft, OR dressing intact, clean and dry. Flexible at the knee joint.   Laboratory: CBC    Component Value Date/Time   WBC 12.6 (H) 10/29/2018 0506   HGB 8.1 (L) 10/29/2018 0506   HGB 14.1 11/17/2013 0621   HCT 25.2 (L) 10/29/2018 0506   HCT 40.1 11/17/2013 0621    PLT 242 10/29/2018 0506   PLT 218 11/17/2013 0621   BMET    Component Value Date/Time   NA 132 (L) 10/29/2018 0506   NA 135 (L) 11/17/2013 0621   K 4.7 10/29/2018 0506   K 4.3 11/17/2013 0621   CL 100 10/29/2018 0506   CL 99 11/17/2013 0621   CO2 25 10/29/2018 0506   CO2 31 11/17/2013 0621   GLUCOSE 264 (H) 10/29/2018 0506   GLUCOSE 172 (H) 11/17/2013 0621   BUN 19 10/29/2018 0506   BUN 10 11/17/2013 0621   CREATININE 0.74 10/29/2018 0506   CREATININE 0.85 11/17/2013 0621   CALCIUM 8.9 10/29/2018 0506   CALCIUM 9.6 11/17/2013 0621   GFRNONAA >60 10/29/2018 0506   GFRNONAA >60 11/17/2013 0621   GFRAA >60 10/29/2018 0506   GFRAA >60 11/17/2013 [id number]   Assessment/Planning: The patient is a 63 year old male with a past medical history of PAD s/p LLE endovascular intervention now left below the knee amputation 1) Gram drop in Hbg - asymptomatic at this time. Intra-op bleeding. Dressing is clean and dry on today's exam. Monitor labs. 2) Repleted sodium  3) Toradol 15mg q6 x 3 days to help with post-op pain. BMP ordered for renal function.  4) Increase in WBC - normal post-op response however CBC ordered for tomorrow 5) PT / OT 6) Patient can be discharged to SNF when medical stable. Please let me know and I can change dressing before discharge or dressing will be changed POD 4/5.   Discussed with Dr. Wallis Martew     PA-C 10/29/2018 9:30 AM

## 2018-10-29 NOTE — Evaluation (Signed)
Physical Therapy Re-evaluation Patient Details Name: Austin [name] MRN: [id number] DOB: 01-Oct-1956 Today's Date: 10/29/2018   History of Present Illness  63 y.o. male with a known history of type 2 diabetes and history DVT who was directly admitted from the podiatry office for persistent left lower extremity diabetic foot infection.  He underwent debridement and first and second toe amputations on 09/30/2018.  He has been on antibiotics as an outpatient, but his wound has continued to worsen.  Underwent left lower extremity angiogram on 10/01/2018 with angioplasty and stent placement.  He denies any fevers or chills.  He endorses some mild left foot pain.  No lower extremity edema. He underwent L transmetatarsal amputation 10/16/18.   Clinical Impression  Pt very talkative and appearing manic during evaluation today.  He was very eager to "get up and move" and willing to attempt transfer to chair.  Pt able to perform bed mobility with only assistance to scoot forward to EOB.  He stood from elevated bed with CGA and was able to stand and balance on R LE with RW.  Pt hopped forward 3x with RW and PT and nurse tech assisted pt in sequencing turning and backing to chair due to pt difficulty in comprehension and pt trying to sit down prematurely.  PT open to education regarding management of residual limb  And seated chair exercises.  PT assisted with positioning for comfort and L limb management.  Pt will continue to benefit from skilled PT with focus on strength, safe functional mobility and management of L LE residual limb.  SNF placement is appropriate for discharge planning due to change in mobility status.    Follow Up Recommendations SNF    Equipment Recommendations  (TBD at next venue of care)    Recommendations for Other Services       Precautions / Restrictions Precautions Precautions: Fall Restrictions Weight Bearing Restrictions: Yes LLE Weight Bearing: Non weight bearing       Mobility  Bed Mobility Overal bed mobility: Needs Assistance Bed Mobility: Supine to Sit     Supine to sit: Min assist     General bed mobility comments: Pt able to get to EOB with min A to scoot forward once sitting.  Transfers Overall transfer level: Needs assistance Equipment used: Rolling walker (2 wheeled) Transfers: Sit to/from Stand Sit to Stand: From elevated surface;Min assist;+2 safety/equipment Stand pivot transfers: Max assist;+2 physical assistance       General transfer comment: Pt able to stand from bedside and support his weight with RW.  Initally was able to hop 3 steps forward and beging to turn with guidance of RW.  Pt began to sit down before backing to chair and PT and nurse tech pulled chair toward pt and assisted pt back into chair.  Ambulation/Gait                Stairs            Wheelchair Mobility    Modified Rankin (Stroke Patients Only)       Balance Overall balance assessment: Needs assistance Sitting-balance support: Feet unsupported Sitting balance-Leahy Scale: Fair Sitting balance - Comments: Pt able to sit at EOB without assistance though he did experience one posterior LOB.   Standing balance support: Bilateral upper extremity supported Standing balance-Leahy Scale: Poor Standing balance comment: Pt relies on RW for standing support but was able to balance on one foot using RW without assitance from therapist  Pertinent Vitals/Pain Pain Assessment: No/denies pain    Home Living Family/patient expects to be discharged to:: Unsure Living Arrangements: Non-relatives/Friends Available Help at Discharge: Friend(s);Available PRN/intermittently Type of Home: Mobile home Home Access: Stairs to enter Entrance Stairs-Rails: Can reach both Entrance Stairs-Number of Steps: 3 Home Layout: One level Home Equipment: Walker - 2 wheels;Wheelchair - manual;Shower seat      Prior Function  Level of Independence: Independent with assistive device(s)         Comments: 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     Hand Dominance   Dominant Hand: Right    Extremity/Trunk Assessment   Upper Extremity Assessment Upper Extremity Assessment: Overall WFL for tasks assessed(Pt able to support wt on RW.)    Lower Extremity Assessment Lower Extremity Assessment: Overall WFL for tasks assessed(Able to support himself in standing on R LE with use of RW.) LLE Deficits / Details: BKA yesterday; knee remains in flexed position.  Post surgical dressing applied and intact.    Cervical / Trunk Assessment Cervical / Trunk Assessment: Normal  Communication   Communication: No difficulties  Cognition Arousal/Alertness: Suspect due to medications Behavior During Therapy: Anxious;Impulsive Overall Cognitive Status: Impaired/Different from baseline Area of Impairment: Attention;Following commands                   Current Attention Level: Alternating   Following Commands: Follows one step commands inconsistently       General Comments: Pt very verbal and repeating statements several times.  Very eager to work with therapy and to "get up and move".      General Comments      Exercises General Exercises - Lower Extremity Ankle Circles/Pumps: Right;15 reps;Strengthening;Seated Quad Sets: Strengthening;Right;10 reps;Seated Straight Leg Raises: Strengthening;Right;5 reps;Seated;Left Other Exercises Other Exercises: Education regarding positioning of L LE post-op and NWB status.  This will need to be re-inforced due to pt's cognitive state. x5 min   Assessment/Plan    PT Assessment    PT Problem List Decreased strength;Decreased mobility;Decreased balance;Decreased activity tolerance;Pain;Decreased range of motion       PT Treatment Interventions DME instruction;Functional mobility training;Balance training;Patient/family education;Gait training;Stair  training;Therapeutic exercise;Therapeutic activities;Neuromuscular re-education    PT Goals (Current goals can be found in the Care Plan section)  Acute Rehab PT Goals Patient Stated Goal: To be able to walk with a prosthesis and RW PT Goal Formulation: With patient Time For Goal Achievement: 10/31/18 Potential to Achieve Goals: Good    Frequency 7X/week   Barriers to discharge        Co-evaluation               AM-PAC PT "6 Clicks" Mobility  Outcome Measure Help needed turning from your back to your side while in a flat bed without using bedrails?: A Lot Help needed moving from lying on your back to sitting on the side of a flat bed without using bedrails?: A Lot Help needed moving to and from a bed to a chair (including a wheelchair)?: A Lot Help needed standing up from a chair using your arms (e.g., wheelchair or bedside chair)?: A Lot Help needed to walk in hospital room?: Total Help needed climbing 3-5 steps with a railing? : Total 6 Click Score: 10    End of Session Equipment Utilized During Treatment: Gait belt Activity Tolerance: Other (comment)(Pt limited by altered mental status following surgery.) Patient left: in bed;with call bell/phone within reach;with bed alarm set Nurse Communication: Mobility status PT  Visit Diagnosis: Unsteadiness on feet (R26.81);Other abnormalities of gait and mobility (R26.89);Muscle weakness (generalized) (M62.81);Pain Pain - Right/Left: Left Pain - part of body: Ankle and joints of foot    Time: [id number] PT Time Calculation (min) (ACUTE ONLY): 20 min   Charges:   PT Evaluation $PT Eval Moderate Complexity: 1 Mod PT Treatments $Therapeutic Activity: 8-22 mins       Glenetta HewSarah Lupie Sawa, PT, DPT   Glenetta HewSarah Raneisha Bress 10/29/2018, 9:02 AM

## 2018-10-29 NOTE — Progress Notes (Signed)
[facility] [place]   PATIENT NAME: Austin [name]    MR#:  [id number]  DATE OF BIRTH:  [birth year]  SUBJECTIVE:   Patient doing fairly well this morning.  Postoperative day #1 left BKA.  Patient had a sitter after procedure due to agitation.  Patient is more appropriate this morning.  REVIEW OF SYSTEMS:    Review of Systems  Constitutional: Negative for fever, chills weight loss HENT: Negative for ear pain, nosebleeds, congestion, facial swelling, rhinorrhea, neck pain, neck stiffness and ear discharge.   Respiratory: Negative for cough, shortness of breath, wheezing  Cardiovascular: Negative for chest pain, palpitations and leg swelling.  Gastrointestinal: Negative for heartburn, abdominal pain, vomiting, diarrhea or consitpation Genitourinary: Negative for dysuria, urgency, frequency, hematuria Musculoskeletal: Negative for back pain or joint pain Neurological: Negative for dizziness, seizures, syncope, focal weakness,  numbness and headaches.  Hematological: Does not bruise/bleed easily.  Psychiatric/Behavioral: Negative for hallucinations, confusion, dysphoric mood    Tolerating Diet:  yes   DRUG ALLERGIES:  No Known Allergies  VITALS:  Blood pressure 128/67, pulse 97, temperature 98.3 F (36.8 C), temperature source Oral, resp. rate 19, height 6\' 4"  (1.93 m), weight 78.2 kg, SpO2 100 %.  PHYSICAL EXAMINATION:  Constitutional: Appears well-developed and well-nourished. No distress. HENT: Normocephalic. Marland Kitchen Oropharynx is clear and moist.  Eyes: Conjunctivae and EOM are normal. PERRLA, no scleral icterus.  Neck: Normal ROM. Neck supple. No JVD. No tracheal deviation. CVS: RRR, S1/S2 +, no murmurs, no gallops, no carotid bruit.  Pulmonary: Effort and breath sounds normal, no stridor, rhonchi, wheezes, rales.  Abdominal: Soft. BS +,  no distension, tenderness, rebound or guarding.  Musculoskeletal: Left BKA which is dressed/bandaged  Neuro:  Alert. CN 2-12 grossly intact. No focal deficits. Skin: Skin is warm and dry. No rash noted. Psychiatric: Normal mood and affect.      LABORATORY PANEL:   CBC Recent Labs  Lab 10/29/18 0506  WBC 12.6*  HGB 8.1*  HCT 25.2*  PLT 242   ------------------------------------------------------------------------------------------------------------------  Chemistries  Recent Labs  Lab 10/28/18 0551 10/29/18 0506  NA 135 132*  K 4.6 4.7  CL 100 100  CO2 28 25  GLUCOSE 195* 264*  BUN 18 19  CREATININE 0.69 0.74  CALCIUM 9.4 8.9  MG 1.9  --    ------------------------------------------------------------------------------------------------------------------  Cardiac Enzymes No results for input(s): TROPONINI in the last 168 hours. ------------------------------------------------------------------------------------------------------------------  RADIOLOGY:  No results found.   ASSESSMENT AND PLAN:   63 year old male with history of uncontrolled diabetes and peripheral vascular disease who presented with left diabetic foot wound.  1.  Left diabetic foot wound: Patient seen and evaluated by podiatry and vascular surgery.  Postoperative day #9 mechanical thrombectomy to left SFA, popliteal artery, tibioperoneal trunk and posterior tibial arteries (left lower extremity endovascular intervention) Postoperative day #1 left BKA  Needs 3 weeks total of IV Zosyn as per culture results and ID consult END date 1/24 Patient has PICC line for IV ABX Vascular surgery consult appreciated. Drop in Hgb from post of blood loss Repeat CBC in am transfuse if <7  2.  Uncontrolled type 2 diabetes with A1c of 8.1: Continue ADA diet with sliding scale and Lantus with NovoLog as per recommendations by diabetes nurse  3.  History of DVT: Continue IV heparin ( eliquis on hold for now) Probably can restart oral Eliquis tomorrow if okay with surgery.   4.Tobacco dependence: Patient is  encouraged to quit smoking. Counseling was provided  for 4 minutes.   5.  Hyperlipidemia: Continue statin.   6.  Acute on chronic anemia: This is postoperative.  We will continue to monitor CBC.  No need for transfusion at this time.  7.  Hyponatremia: Repeat in a.m.  8.  Elevated white blood cell count due to surgery.  Repeat CBC in a.m.   Patient needs to be without sitter for 24 hours before going to skilled nursing facility.  Discharge planning for tomorrow to skilled nursing facility.   Management plans discussed with the patient and he is in agreement.  CODE STATUS: full  TOTAL TIME TAKING CARE OF THIS PATIENT: 24 minutes.     POSSIBLE D/C tomorrow to SNF, DEPENDING ON CLINICAL CONDITION.   Eduard Penkala M.D on 10/29/2018 at 10:02 AM  Between 7am to 6pm - Pager - [phone] After 6pm go to [email or web address] - password [email or web address]  [facility]  Office  [phone]  CC: Primary care physician; Preston Fleeting, MD  Note: This dictation was prepared with Dragon dictation along with smaller phrase technology. Any transcriptional errors that result from this process are unintentional.

## 2018-10-30 LAB — BASIC METABOLIC PANEL
Anion gap: 5 (ref 5–15)
BUN: 26 mg/dL — AB (ref 8–23)
CO2: 28 mmol/L (ref 22–32)
Calcium: 8 mg/dL — ABNORMAL LOW (ref 8.9–10.3)
Chloride: 105 mmol/L (ref 98–111)
Creatinine, Ser: 0.71 mg/dL (ref 0.61–1.24)
GFR calc Af Amer: >60 mL/min (ref >60)
GFR calc non Af Amer: >60 mL/min (ref >60)
Glucose, Bld: 212 mg/dL — ABNORMAL HIGH (ref 70–99)
Potassium: 4.7 mmol/L (ref 3.5–5.1)
Sodium: 138 mmol/L (ref 135–145)

## 2018-10-30 LAB — CBC
HCT: 22.3 % — ABNORMAL LOW (ref 39.0–52.0)
Hemoglobin: 7.1 g/dL — ABNORMAL LOW (ref 13.0–17.0)
MCH: 28.3 pg (ref 26.0–34.0)
MCHC: 31.8 g/dL (ref 30.0–36.0)
MCV: 88.8 fL (ref 80.0–100.0)
PLATELETS: 213 10*3/uL (ref 150–400)
RBC: 2.51 MIL/uL — AB (ref 4.22–5.81)
RDW: 14.2 % (ref 11.5–15.5)
WBC: 10 10*3/uL (ref 4.0–10.5)
nRBC: 0 % (ref 0.0–0.2)

## 2018-10-30 LAB — MAGNESIUM: Magnesium: 1.9 mg/dL (ref 1.7–2.4)

## 2018-10-30 LAB — ABO/RH: ABO/RH(D): O POS

## 2018-10-30 LAB — SURGICAL PATHOLOGY

## 2018-10-30 LAB — GLUCOSE, CAPILLARY
Glucose-Capillary: 164 mg/dL — ABNORMAL HIGH (ref 70–99)
Glucose-Capillary: 163 mg/dL — ABNORMAL HIGH (ref 70–99)
Glucose-Capillary: 198 mg/dL — ABNORMAL HIGH (ref 70–99)

## 2018-10-30 LAB — PREPARE RBC (CROSSMATCH)

## 2018-10-30 MED ORDER — INSULIN ASPART 100 UNIT/ML ~~LOC~~ SOLN: 10.0000 [IU] | Freq: Three times a day (TID) | SUBCUTANEOUS | 0 refills | Status: DC

## 2018-10-30 MED ORDER — ENSURE MAX PROTEIN PO LIQD: 11.0000 [oz_av] | Freq: Two times a day (BID) | ORAL | Status: DC

## 2018-10-30 MED ORDER — INSULIN GLARGINE 100 UNIT/ML ~~LOC~~ SOLN: 30.0000 [IU] | Freq: Every day | SUBCUTANEOUS | 11 refills | Status: DC

## 2018-10-30 MED ORDER — PANTOPRAZOLE SODIUM 40 MG PO TBEC: 40.0000 mg | DELAYED_RELEASE_TABLET | Freq: Every day | ORAL | 0 refills | Status: DC

## 2018-10-30 MED ORDER — OXYCODONE HCL 5 MG PO TABS: 5.0000 mg | ORAL_TABLET | ORAL | 0 refills | Status: DC | PRN

## 2018-10-30 MED ORDER — SODIUM CHLORIDE 0.9% IV SOLUTION
Freq: Once | INTRAVENOUS | Status: AC
  Administered 2018-10-30: 11:00:00 via INTRAVENOUS

## 2018-10-30 MED ORDER — NICOTINE 14 MG/24HR TD PT24: 14.0000 mg | MEDICATED_PATCH | Freq: Every day | TRANSDERMAL | 0 refills | Status: DC

## 2018-10-30 NOTE — Care Management Important Message (Signed)
Copy of signed Medicare IM left with patient in room. 

## 2018-10-30 NOTE — Care Management (Signed)
[name]

## 2018-10-30 NOTE — Progress Notes (Signed)
Per MD okay for RN to remove PICC line before DC.

## 2018-10-30 NOTE — Progress Notes (Signed)
RN called report to [facility]. Report given to Vena Austria LPN.

## 2018-10-30 NOTE — Discharge Summary (Signed)
[employer] - [place] at [facility]   PATIENT NAME: Austin [name]    MR#:  [id number]  DATE OF BIRTH:  08/17/1956  DATE OF ADMISSION:  10/14/2018 ADMITTING PHYSICIAN: Austin StallKaty Dodd Mayo, MD  DATE OF DISCHARGE: 10/30/2018  PRIMARY CARE PHYSICIAN: Austin [name], Austin Mancheno, MD    ADMISSION DIAGNOSIS:  Osteomyelitis gangrene  DISCHARGE DIAGNOSIS:  Active Problems:   Diabetic foot infection (HCC)   SECONDARY DIAGNOSIS:   Past Medical History:  Diagnosis Date  . Diabetes (HCC)   . DVT (deep venous thrombosis) [facility](HCC)     HOSPITAL COURSE:   63 year old male with history of uncontrolled diabetes and peripheral vascular disease who presented with left diabetic foot wound.  1.  Left diabetic foot wound: Patient seen and evaluated by podiatry and vascular surgery.  Postoperative day #8 mechanical thrombectomy to left SFA, popliteal artery, tibioperoneal trunk and posterior tibial arteries (left lower extremity endovascular intervention) Postoperative day #2 left BKA  He required 3 weeks total of IV Zosyn.  He had a PICC line placed.  Today is the last day for IV antibiotics as per ID consult.    Patient will follow-up with vascular surgery.  2.  Uncontrolled type 2 diabetes with A1c of 8.1: Continue ADA diet with  Lantus with NovoLog as per recommendations by diabetes nurse  3.  History of DVT: As per my conversation with vascular surgery patient may restart Eliquis today. 4.Tobacco dependence: Patient is encouraged to quit smoking. Counseling was provided for 4 minutes.   5.  Hyperlipidemia: Continue statin.   6.  Acute on chronic anemia: Patient has postoperative acute blood loss.  He required 1 unit PRBC.  We asked that PCP please repeat CBC in 2 to 3 days.  7.  Hyponatremia: This has normalized  8.  Elevated white blood cell count due to surgery.    Has normalized   DISCHARGE CONDITIONS AND DIET:   Stable for discharge on diabetic heart healthy  diet  CONSULTS OBTAINED:  Treatment Team:  Linus Galasline, Todd, DPM Lynn Itoavishankar, Jayashree, MD  DRUG ALLERGIES:  No Known Allergies  DISCHARGE MEDICATIONS:   Allergies as of 10/30/2018   No Known Allergies     Medication List    STOP taking these medications   amoxicillin-clavulanate 875-125 MG tablet Commonly known as:  AUGMENTIN   diclofenac sodium 1 % Gel Commonly known as:  VOLTAREN   doxycycline 100 MG capsule Commonly known as:  VIBRAMYCIN   nicotine 21 mg/24hr patch Commonly known as:  NICODERM CQ - dosed in mg/24 hours Replaced by:  nicotine 14 mg/24hr patch     TAKE these medications   apixaban 5 MG Tabs tablet Commonly known as:  ELIQUIS Take 1 tablet (5 mg total) by mouth 2 (two) times daily.   atorvastatin 80 MG tablet Commonly known as:  LIPITOR Take 1 tablet (80 mg total) by mouth daily at 6 PM.   clopidogrel 75 MG tablet Commonly known as:  PLAVIX Take 1 tablet (75 mg total) by mouth daily.   docusate sodium 100 MG capsule Commonly known as:  COLACE Take 1 capsule (100 mg total) by mouth 2 (two) times daily.   ENSURE MAX PROTEIN Liqd Take 330 mLs (11 oz total) by mouth 2 (two) times daily.   insulin aspart 100 UNIT/ML injection Commonly known as:  novoLOG Inject 10 Units into the skin 3 (three) times daily with meals.   insulin glargine 100 UNIT/ML injection Commonly known as:  LANTUS Inject 0.3 mLs (30  Units total) into the skin at bedtime. What changed:    how much to take  when to take this   nicotine 14 mg/24hr patch Commonly known as:  NICODERM CQ - dosed in mg/24 hours Place 1 patch (14 mg total) onto the skin daily. Start taking on:  October 31, 2018 Replaces:  nicotine 21 mg/24hr patch   oxyCODONE 5 MG immediate release tablet Commonly known as:  Oxy IR/ROXICODONE Take 1 tablet (5 mg total) by mouth every 4 (four) hours as needed for moderate pain.   pantoprazole 40 MG tablet Commonly known as:  PROTONIX Take 1 tablet (40 mg  total) by mouth daily.   polyethylene glycol packet Commonly known as:  MIRALAX Take 17 g by mouth daily.            Discharge Care Instructions  (From admission, onward)         Start     Ordered   10/30/18 0000  Discharge wound care:    Comments:  Change dressing in 2  Days woth kerlex or if solied   10/30/18 0947            Today   CHIEF COMPLAINT:   No acute events overnight   VITAL SIGNS:  Blood pressure 112/77, pulse 89, temperature 97.7 F (36.5 C), temperature source Oral, resp. rate 16, height 6\' 4"  (1.93 m), weight 78.2 kg, SpO2 99 %.   REVIEW OF SYSTEMS:  Review of Systems  Constitutional: Negative.  Negative for chills, fever and malaise/fatigue.  HENT: Negative.  Negative for ear discharge, ear pain, hearing loss, nosebleeds and sore throat.   Eyes: Negative.  Negative for blurred vision and pain.  Respiratory: Negative.  Negative for cough, hemoptysis, shortness of breath and wheezing.   Cardiovascular: Negative.  Negative for chest pain, palpitations and leg swelling.  Gastrointestinal: Negative.  Negative for abdominal pain, blood in stool, diarrhea, nausea and vomiting.  Genitourinary: Negative.  Negative for dysuria.  Musculoskeletal: Negative.  Negative for back pain.  Skin: Negative.   Neurological: Negative for dizziness, tremors, speech change, focal weakness, seizures and headaches.  Endo/Heme/Allergies: Negative.  Does not bruise/bleed easily.  Psychiatric/Behavioral: Negative.  Negative for depression, hallucinations and suicidal ideas.     PHYSICAL EXAMINATION:  GENERAL:  63 y.o.-year-old patient lying in the bed with no acute distress.  NECK:  Supple, no jugular venous distention. No thyroid enlargement, no tenderness.  LUNGS: Normal breath sounds bilaterally, no wheezing, rales,rhonchi  No use of accessory muscles of respiration.  CARDIOVASCULAR: S1, S2 normal. No murmurs, rubs, or gallops.  ABDOMEN: Soft, non-tender,  non-distended. Bowel sounds present. No organomegaly or mass.  EXTREMITIES: No pedal edema, cyanosis, or clubbing.  PSYCHIATRIC: The patient is alert and oriented x 3.  SKIN: No obvious rash, lesion, or ulcer.  Left BKA bandaged DATA REVIEW:   CBC Recent Labs  Lab 10/30/18 0456  WBC 10.0  HGB 7.1*  HCT 22.3*  PLT 213    Chemistries  Recent Labs  Lab 10/30/18 0456  NA 138  K 4.7  CL 105  CO2 28  GLUCOSE 212*  BUN 26*  CREATININE 0.71  CALCIUM 8.0*  MG 1.9    Cardiac Enzymes No results for input(s): TROPONINI in the last 168 hours.  Microbiology Results  @MICRORSLT48 @  RADIOLOGY:  No results found.    Allergies as of 10/30/2018   No Known Allergies     Medication List    STOP taking these medications   amoxicillin-clavulanate 875-125  MG tablet Commonly known as:  AUGMENTIN   diclofenac sodium 1 % Gel Commonly known as:  VOLTAREN   doxycycline 100 MG capsule Commonly known as:  VIBRAMYCIN   nicotine 21 mg/24hr patch Commonly known as:  NICODERM CQ - dosed in mg/24 hours Replaced by:  nicotine 14 mg/24hr patch     TAKE these medications   apixaban 5 MG Tabs tablet Commonly known as:  ELIQUIS Take 1 tablet (5 mg total) by mouth 2 (two) times daily.   atorvastatin 80 MG tablet Commonly known as:  LIPITOR Take 1 tablet (80 mg total) by mouth daily at 6 PM.   clopidogrel 75 MG tablet Commonly known as:  PLAVIX Take 1 tablet (75 mg total) by mouth daily.   docusate sodium 100 MG capsule Commonly known as:  COLACE Take 1 capsule (100 mg total) by mouth 2 (two) times daily.   ENSURE MAX PROTEIN Liqd Take 330 mLs (11 oz total) by mouth 2 (two) times daily.   insulin aspart 100 UNIT/ML injection Commonly known as:  novoLOG Inject 10 Units into the skin 3 (three) times daily with meals.   insulin glargine 100 UNIT/ML injection Commonly known as:  LANTUS Inject 0.3 mLs (30 Units total) into the skin at bedtime. What changed:    how much to  take  when to take this   nicotine 14 mg/24hr patch Commonly known as:  NICODERM CQ - dosed in mg/24 hours Place 1 patch (14 mg total) onto the skin daily. Start taking on:  October 31, 2018 Replaces:  nicotine 21 mg/24hr patch   oxyCODONE 5 MG immediate release tablet Commonly known as:  Oxy IR/ROXICODONE Take 1 tablet (5 mg total) by mouth every 4 (four) hours as needed for moderate pain.   pantoprazole 40 MG tablet Commonly known as:  PROTONIX Take 1 tablet (40 mg total) by mouth daily.   polyethylene glycol packet Commonly known as:  MIRALAX Take 17 g by mouth daily.            Discharge Care Instructions  (From admission, onward)         Start     Ordered   10/30/18 0000  Discharge wound care:    Comments:  Change dressing in 2  Days woth kerlex or if solied   10/30/18 0947             Management plans discussed with the patient and he is in agreement. Stable for discharge snf  Patient should follow up with vascular  CODE STATUS:     Code Status Orders  (From admission, onward)         Start     Ordered   10/14/18 1703  Full code  Continuous     10/14/18 1702        Code Status History    Date Active Date Inactive Code Status Order ID Comments User Context   09/29/2018 1254 10/04/2018 1812 Full Code [id number]  Ramonita Lab, MD Inpatient   02/09/2018 1622 02/12/2018 2126 Full Code [id number]  Houston Siren, MD Inpatient      TOTAL TIME TAKING CARE OF THIS PATIENT: 38 minutes.    Note: This dictation was prepared with Dragon dictation along with smaller phrase technology. Any transcriptional errors that result from this process are unintentional.  Heiress Williamson M.D on 10/30/2018 at 10:03 AM  Between 7am to 6pm - Pager - [phone] After 6pm go to [email or web address] - password [email or web address]  [employer]  Office  [phone]  CC: Primary care physician; Theotis Burrow, MD

## 2018-10-30 NOTE — Clinical Social Work Note (Signed)
Berkley Harvey has been received from [facility]. Patient is cleared to discharge from CSW standpoint. York Spaniel MSW,LCSW [phone]

## 2018-10-30 NOTE — Clinical Social Work Note (Signed)
Physician informed CSW that patient would discharge today after he receives blood. CSW contacted his sister: Deloris Margarette Asal to notify her and provided her the phone and address for [name]. CSW informed Tresa Endo at [name] and sent discharge instructions. Tresa Endo is waiting on re-auth from [facility] prior to [place] sending patient. York Spaniel MSW,LCSW [phone]

## 2018-10-30 NOTE — Progress Notes (Signed)
[facility] Daily Progress Note   Subjective: 2 Day Post-Op: Left below-the-knee amputation  8 Days Post-Op:Left lower extremity angiogram,Catheter placement into left posterior tibial artery from right femoral approach,Mechanical thrombectomy to the left SFA, popliteal artery, tibioperoneal trunk, and posterior tibial arteries with the penumbra cat 6 device,Percutaneous transluminal angioplasty ofposterior tibial artery and tibioperoneal trunk with 3 mm diameter by 30 cm length angioplasty balloon,Viabahn covered stent placement to the left popliteal artery with 6 mm diameter by 15 cm length stent for thrombus and stenosis in the left popliteal artery in the distal portion of the previous stents and just below the previous stents,Viabahn covered stent placement to the left tibioperoneal trunk and distal popliteal artery with a 5 mm diameter by 7.5 cm length stent for residual thrombus and stenosis after angioplastywithStarClose closure device rightfemoral artery.  No issues overnight. Some stump pain.   Objective: Vitals:   10/29/18 1137 10/29/18 2040 10/30/18 0521 10/30/18 1107  BP: 134/63 118/64 112/77 (!) 115/55  Pulse: 97 92 89 91  Resp: 13 16 16    Temp: 97.7 F (36.5 C) 97.6 F (36.4 C) 97.7 F (36.5 C) 98.3 F (36.8 C)  TempSrc: Oral Oral Oral Oral  SpO2: 100% 100% 99% 100%  Weight:      Height:        Intake/Output Summary (Last 24 hours) at 10/30/2018 1127 Last data filed at 10/30/2018 1740 Gross per 24 hour  Intake 600 ml  Output 1450 ml  Net -850 ml   Physical Exam: A&Ox3, NAD CV: RRR Pulmonary: CTA Bilaterally Abdomen: Soft, Nontender, Nondistended Vascular:  Left Lower Extremity: Thigh soft. Knee flexible at joint. OR dressing intact, clean and dry.   Laboratory: CBC    Component Value Date/Time   WBC 10.0 10/30/2018 0456   HGB 7.1 (L) 10/30/2018 0456   HGB 14.1 11/17/2013 0621   HCT 22.3 (L) 10/30/2018 0456   HCT 40.1  11/17/2013 0621   PLT 213 10/30/2018 0456   PLT 218 11/17/2013 0621   BMET    Component Value Date/Time   NA 138 10/30/2018 0456   NA 135 (L) 11/17/2013 0621   K 4.7 10/30/2018 0456   K 4.3 11/17/2013 0621   CL 105 10/30/2018 0456   CL 99 11/17/2013 0621   CO2 28 10/30/2018 0456   CO2 31 11/17/2013 0621   GLUCOSE 212 (H) 10/30/2018 0456   GLUCOSE 172 (H) 11/17/2013 0621   BUN 26 (H) 10/30/2018 0456   BUN 10 11/17/2013 0621   CREATININE 0.71 10/30/2018 0456   CREATININE 0.85 11/17/2013 0621   CALCIUM 8.0 (L) 10/30/2018 0456   CALCIUM 9.6 11/17/2013 0621   GFRNONAA >60 10/30/2018 0456   GFRNONAA >60 11/17/2013 0621   GFRAA >60 10/30/2018 0456   GFRAA >60 11/17/2013 8144   Assessment/Planning: The patient is a 63 year old male with a past medical history of PAD s/p LLE endovascular intervention now left below the knee amputation 1) Another gram drop in Hbg this AM now at 7.1- asymptomatic at this time. Dressing is clean and dry on today's exam. Monitor labs. Possible transfusion? 2) Toradol 15mg q6 x 3 days to help with post-op pain. BMP ordered for renal function. Stable. 3) WBC trending down.  4) PT / OT 5) Patient can be discharged to SNF when medical stable.  6) Stump dressing orders / discharge follow up has been placed in patients discharge  Discussed with Dr. Wallis Mart [facility] PA-C 10/30/2018 11:27 AM

## 2018-10-30 NOTE — Clinical Social Work Placement (Signed)
   CLINICAL SOCIAL WORK PLACEMENT  NOTE  Date:  10/30/2018  Patient Details  Name: Austin [name] MRN: [id number] Date of Birth: August 09, 1956  Clinical Social Work is seeking post-discharge placement for this patient at the Skilled  Nursing Facility level of care (*CSW will initial, date and re-position this form in  chart as items are completed):  Yes   Patient/family provided with [facility] Clinical Social Work Department's list of facilities offering this level of care within the geographic area requested by the patient (or if unable, by the patient's family).  Yes   Patient/family informed of their freedom to choose among providers that offer the needed level of care, that participate in Medicare, Medicaid or managed care program needed by the patient, have an available bed and are willing to accept the patient.  Yes   Patient/family informed of [facility]'s ownership interest in [facility] and [facility], as well as of the fact that they are under no obligation to receive care at these facilities.  PASRR submitted to EDS on       PASRR number received on       Existing PASRR number confirmed on 10/15/18     FL2 transmitted to all facilities in geographic area requested by pt/family on 10/15/18     FL2 transmitted to all facilities within larger geographic area on       Patient informed that his/her managed care company has contracts with or will negotiate with certain facilities, including the following:        Yes   Patient/family informed of bed offers received.  Patient chooses bed at [facility])     Physician recommends and patient chooses bed at [facility])    [facility] on 10/30/18.  Patient to be transferred to facility by       Patient family notified on 10/30/18 of transfer.  Name of family member notified:  Deloris: sister     PHYSICIAN       Additional Comment:     _______________________________________________ York Spaniel, LCSW 10/30/2018, 12:19 PM

## 2018-10-30 NOTE — Progress Notes (Signed)
OT Cancellation Note  Patient Details Name: Austin [name] MRN: [id number] DOB: 02-Oct-1956   Cancelled Treatment:    Reason Eval/Treat Not Completed: Patient at procedure or test/ unavailable(Pt. having BM issues. Plans to receive blood transfusion prior to discharging to SNF today.)  Olegario Messier, MS, OTR/L 10/30/2018, 10:17 AM

## 2018-10-30 NOTE — Discharge Planning (Signed)
Patient IV removed.  RN assessment and VS revealed stability for DC to [facility]. Report called. Discharge papers printed and placed in facility packet. EMS contacted and arrived to transport.

## 2018-10-31 LAB — TYPE AND SCREEN
ABO/RH(D): O POS
Antibody Screen: NEGATIVE
Unit division: 0

## 2018-10-31 LAB — BPAM RBC
Blood Product Expiration Date: 202002232359
ISSUE DATE / TIME: 202001241115
Unit Type and Rh: 5100

## 2018-11-18 ENCOUNTER — Other Ambulatory Visit (INDEPENDENT_AMBULATORY_CARE_PROVIDER_SITE_OTHER): Payer: Self-pay | Admitting: Vascular Surgery

## 2018-11-18 DIAGNOSIS — Z95820 Peripheral vascular angioplasty status with implants and grafts: Secondary | ICD-10-CM

## 2018-11-19 ENCOUNTER — Encounter (INDEPENDENT_AMBULATORY_CARE_PROVIDER_SITE_OTHER): Payer: Self-pay | Admitting: Nurse Practitioner

## 2018-11-19 ENCOUNTER — Other Ambulatory Visit (INDEPENDENT_AMBULATORY_CARE_PROVIDER_SITE_OTHER): Payer: Self-pay | Admitting: Nurse Practitioner

## 2018-11-19 ENCOUNTER — Ambulatory Visit (INDEPENDENT_AMBULATORY_CARE_PROVIDER_SITE_OTHER): Payer: Medicare Other

## 2018-11-19 ENCOUNTER — Ambulatory Visit (INDEPENDENT_AMBULATORY_CARE_PROVIDER_SITE_OTHER): Payer: Medicare Other | Admitting: Nurse Practitioner

## 2018-11-19 VITALS — BP 104/72 | HR 96 | Resp 14

## 2018-11-19 DIAGNOSIS — Z89512 Acquired absence of left leg below knee: Secondary | ICD-10-CM

## 2018-11-19 DIAGNOSIS — E782 Mixed hyperlipidemia: Secondary | ICD-10-CM

## 2018-11-19 DIAGNOSIS — I70213 Atherosclerosis of native arteries of extremities with intermittent claudication, bilateral legs: Secondary | ICD-10-CM

## 2018-11-19 DIAGNOSIS — Z95820 Peripheral vascular angioplasty status with implants and grafts: Secondary | ICD-10-CM | POA: Diagnosis not present

## 2018-11-19 DIAGNOSIS — I739 Peripheral vascular disease, unspecified: Secondary | ICD-10-CM

## 2018-11-19 DIAGNOSIS — E1151 Type 2 diabetes mellitus with diabetic peripheral angiopathy without gangrene: Secondary | ICD-10-CM

## 2018-11-19 DIAGNOSIS — Z794 Long term (current) use of insulin: Secondary | ICD-10-CM

## 2018-11-19 DIAGNOSIS — Z87891 Personal history of nicotine dependence: Secondary | ICD-10-CM

## 2018-11-19 MED ORDER — GABAPENTIN 300 MG PO CAPS: 300.0000 mg | ORAL_CAPSULE | Freq: Two times a day (BID) | ORAL | 0 refills | Status: DC

## 2018-11-20 ENCOUNTER — Encounter (INDEPENDENT_AMBULATORY_CARE_PROVIDER_SITE_OTHER): Payer: Self-pay

## 2018-11-20 ENCOUNTER — Telehealth (INDEPENDENT_AMBULATORY_CARE_PROVIDER_SITE_OTHER): Payer: Self-pay

## 2018-11-20 NOTE — Telephone Encounter (Signed)
I attempted to contact the patient to get him scheduled for his procedure with Dr. Wyn Quaker . I was unable to leave a message due to the patient's voicemail not being set up. I called and spoke with the patient's son and asked that he have his father return a call to me.

## 2018-11-20 NOTE — Telephone Encounter (Signed)
Spoke with the patient and he is okay with being scheduled for 11/25/2018 with Dr. Wyn Quaker. I did call [facility] and left a message for the director to return a call so that I could speak with someone before faxing the information over to the facility.

## 2018-11-23 ENCOUNTER — Other Ambulatory Visit (INDEPENDENT_AMBULATORY_CARE_PROVIDER_SITE_OTHER): Payer: Self-pay | Admitting: Vascular Surgery

## 2018-11-24 MED ORDER — CEFAZOLIN SODIUM-DEXTROSE 2-4 GM/100ML-% IV SOLN: 2.0000 g | Freq: Once | INTRAVENOUS | Status: DC

## 2018-11-25 ENCOUNTER — Ambulatory Visit
Admission: RE | Admit: 2018-11-25 | Discharge: 2018-11-25 | Disposition: A | Discharge status: Rehabilitation Facility | Payer: Medicare Other | Source: Ambulatory Visit | Attending: Vascular Surgery | Admitting: Vascular Surgery

## 2018-11-25 ENCOUNTER — Encounter
Admission: RE | Disposition: A | Discharge status: Rehabilitation Facility | Payer: Self-pay | Source: Ambulatory Visit | Attending: Vascular Surgery

## 2018-11-25 ENCOUNTER — Other Ambulatory Visit: Payer: Self-pay

## 2018-11-25 DIAGNOSIS — I70221 Atherosclerosis of native arteries of extremities with rest pain, right leg: Secondary | ICD-10-CM

## 2018-11-25 DIAGNOSIS — Z89512 Acquired absence of left leg below knee: Secondary | ICD-10-CM | POA: Diagnosis not present

## 2018-11-25 DIAGNOSIS — I70219 Atherosclerosis of native arteries of extremities with intermittent claudication, unspecified extremity: Secondary | ICD-10-CM | POA: Diagnosis not present

## 2018-11-25 DIAGNOSIS — Z86718 Personal history of other venous thrombosis and embolism: Secondary | ICD-10-CM | POA: Insufficient documentation

## 2018-11-25 DIAGNOSIS — Z87891 Personal history of nicotine dependence: Secondary | ICD-10-CM | POA: Insufficient documentation

## 2018-11-25 DIAGNOSIS — E1151 Type 2 diabetes mellitus with diabetic peripheral angiopathy without gangrene: Secondary | ICD-10-CM | POA: Diagnosis present

## 2018-11-25 DIAGNOSIS — M79671 Pain in right foot: Secondary | ICD-10-CM | POA: Diagnosis not present

## 2018-11-25 HISTORY — PX: LOWER EXTREMITY ANGIOGRAPHY: CATH118251

## 2018-11-25 LAB — CREATININE, SERUM: Creatinine, Ser: 0.64 mg/dL (ref 0.61–1.24)

## 2018-11-25 LAB — GLUCOSE, CAPILLARY
Glucose-Capillary: 175 mg/dL — ABNORMAL HIGH (ref 70–99)
Glucose-Capillary: 175 mg/dL — ABNORMAL HIGH (ref 70–99)

## 2018-11-25 LAB — BUN: BUN: 10 mg/dL (ref 8–23)

## 2018-11-25 SURGERY — LOWER EXTREMITY ANGIOGRAPHY
Anesthesia: Moderate Sedation | Laterality: Right

## 2018-11-25 MED ORDER — SODIUM CHLORIDE 0.9 % IV SOLN: 250.0000 mL | INTRAVENOUS | Status: DC | PRN

## 2018-11-25 MED ORDER — ASPIRIN EC 81 MG PO TBEC
DELAYED_RELEASE_TABLET | ORAL | Status: AC
  Filled 2018-11-25: qty 1

## 2018-11-25 MED ORDER — SODIUM CHLORIDE 0.9 % IV SOLN: INTRAVENOUS | Status: DC

## 2018-11-25 MED ORDER — FAMOTIDINE 20 MG PO TABS: 40.0000 mg | ORAL_TABLET | ORAL | Status: DC | PRN

## 2018-11-25 MED ORDER — FENTANYL CITRATE (PF) 100 MCG/2ML IJ SOLN
INTRAMUSCULAR | Status: DC | PRN
  Administered 2018-11-25: 25 ug via INTRAVENOUS
  Administered 2018-11-25: 50 ug via INTRAVENOUS
  Administered 2018-11-25: 25 ug via INTRAVENOUS

## 2018-11-25 MED ORDER — LIDOCAINE-EPINEPHRINE (PF) 1 %-1:200000 IJ SOLN
INTRAMUSCULAR | Status: AC
  Filled 2018-11-25: qty 30

## 2018-11-25 MED ORDER — ONDANSETRON HCL 4 MG/2ML IJ SOLN: 4.0000 mg | Freq: Four times a day (QID) | INTRAMUSCULAR | Status: DC | PRN

## 2018-11-25 MED ORDER — ASPIRIN EC 81 MG PO TBEC: 81.0000 mg | DELAYED_RELEASE_TABLET | Freq: Every day | ORAL | 2 refills | Status: DC

## 2018-11-25 MED ORDER — LABETALOL HCL 5 MG/ML IV SOLN: 10.0000 mg | INTRAVENOUS | Status: DC | PRN

## 2018-11-25 MED ORDER — MIDAZOLAM HCL 2 MG/2ML IJ SOLN
INTRAMUSCULAR | Status: DC | PRN
  Administered 2018-11-25 (×2): 1 mg via INTRAVENOUS
  Administered 2018-11-25: 2 mg via INTRAVENOUS

## 2018-11-25 MED ORDER — ACETAMINOPHEN 325 MG PO TABS: 650.0000 mg | ORAL_TABLET | ORAL | Status: DC | PRN

## 2018-11-25 MED ORDER — CEFAZOLIN SODIUM-DEXTROSE 2-4 GM/100ML-% IV SOLN
INTRAVENOUS | Status: AC
  Filled 2018-11-25: qty 100

## 2018-11-25 MED ORDER — ASPIRIN EC 81 MG PO TBEC
81.0000 mg | DELAYED_RELEASE_TABLET | Freq: Every day | ORAL | Status: DC
  Administered 2018-11-25: 81 mg via ORAL

## 2018-11-25 MED ORDER — IOPAMIDOL (ISOVUE-300) INJECTION 61%
INTRAVENOUS | Status: DC | PRN
  Administered 2018-11-25: 50 mL via INTRA_ARTERIAL

## 2018-11-25 MED ORDER — DIPHENHYDRAMINE HCL 50 MG/ML IJ SOLN: 50.0000 mg | Freq: Once | INTRAMUSCULAR | Status: DC

## 2018-11-25 MED ORDER — METHYLPREDNISOLONE SODIUM SUCC 125 MG IJ SOLR: 125.0000 mg | INTRAMUSCULAR | Status: DC | PRN

## 2018-11-25 MED ORDER — SODIUM CHLORIDE 0.9% FLUSH: 3.0000 mL | INTRAVENOUS | Status: DC | PRN

## 2018-11-25 MED ORDER — SODIUM CHLORIDE 0.9% FLUSH: 3.0000 mL | Freq: Two times a day (BID) | INTRAVENOUS | Status: DC

## 2018-11-25 MED ORDER — HYDROMORPHONE HCL 1 MG/ML IJ SOLN: 1.0000 mg | Freq: Once | INTRAMUSCULAR | Status: DC | PRN

## 2018-11-25 MED ORDER — MIDAZOLAM HCL 2 MG/ML PO SYRP: 8.0000 mg | ORAL_SOLUTION | Freq: Once | ORAL | Status: DC | PRN

## 2018-11-25 MED ORDER — FENTANYL CITRATE (PF) 100 MCG/2ML IJ SOLN
INTRAMUSCULAR | Status: AC
  Filled 2018-11-25: qty 2

## 2018-11-25 MED ORDER — HEPARIN SODIUM (PORCINE) 1000 UNIT/ML IJ SOLN
INTRAMUSCULAR | Status: AC
  Filled 2018-11-25: qty 1

## 2018-11-25 MED ORDER — MIDAZOLAM HCL 5 MG/5ML IJ SOLN
INTRAMUSCULAR | Status: AC
  Filled 2018-11-25: qty 5

## 2018-11-25 MED ORDER — HEPARIN SODIUM (PORCINE) 1000 UNIT/ML IJ SOLN
INTRAMUSCULAR | Status: DC | PRN
  Administered 2018-11-25: 5000 [IU] via INTRAVENOUS

## 2018-11-25 MED ORDER — HYDRALAZINE HCL 20 MG/ML IJ SOLN: 5.0000 mg | INTRAMUSCULAR | Status: DC | PRN

## 2018-11-25 SURGICAL SUPPLY — 21 items
BALLN LUTONIX 018 4X150X130 (BALLOONS) ×3
BALLN LUTONIX 018 5X150X130 (BALLOONS) ×3
BALLN LUTONIX 018 5X220X130 (BALLOONS) ×3
BALLOON LUTONIX 018 4X150X130 (BALLOONS) ×1 IMPLANT
BALLOON LUTONIX 018 5X150X130 (BALLOONS) ×1 IMPLANT
BALLOON LUTONIX 018 5X220X130 (BALLOONS) ×1 IMPLANT
CATH BEACON 5 .038 100 VERT TP (CATHETERS) ×3 IMPLANT
CATH CXI SUPP ANG 4FR 135 (CATHETERS) ×1 IMPLANT
CATH CXI SUPP ANG 4FR 135CM (CATHETERS) ×3
CATH PIG 70CM (CATHETERS) ×3 IMPLANT
DEVICE PRESTO INFLATION (MISCELLANEOUS) ×3 IMPLANT
DEVICE STARCLOSE SE CLOSURE (Vascular Products) ×3 IMPLANT
GLIDEWIRE ADV .035X260CM (WIRE) ×3 IMPLANT
PACK ANGIOGRAPHY (CUSTOM PROCEDURE TRAY) ×3 IMPLANT
SHEATH ANL2 6FRX45 HC (SHEATH) ×3 IMPLANT
SHEATH BRITE TIP 5FRX11 (SHEATH) ×3 IMPLANT
SYR MEDRAD MARK V 150ML (SYRINGE) ×3 IMPLANT
TOWEL OR 17X26 4PK STRL BLUE (TOWEL DISPOSABLE) ×3 IMPLANT
TUBING CONTRAST HIGH PRESS 72 (TUBING) ×3 IMPLANT
WIRE G V18X300CM (WIRE) ×3 IMPLANT
WIRE J 3MM .035X145CM (WIRE) ×3 IMPLANT

## 2018-11-25 NOTE — Progress Notes (Signed)
Drsg. Change performed to Left BKA stump. Staple line well approximated without drainage, hematoma, ecchymosis. Pink tissue /incisional line under staples. Sterile 4x4's, then Kling wrap applied to entire stump area. Pt. Tolerated well.

## 2018-11-25 NOTE — Discharge Instructions (Signed)
Angiogram, Care After °This sheet gives you information about how to care for yourself after your procedure. Your doctor may also give you more specific instructions. If you have problems or questions, contact your doctor. °Follow these instructions at home: °Insertion site care °· Follow instructions from your doctor about how to take care of your long, thin tube (catheter) insertion area. Make sure you: °? Wash your hands with soap and water before you change your bandage (dressing). If you cannot use soap and water, use hand sanitizer. °? Change your bandage as told by your doctor. °? Leave stitches (sutures), skin glue, or skin tape (adhesive) strips in place. They may need to stay in place for 2 weeks or longer. If tape strips get loose and curl up, you may trim the loose edges. Do not remove tape strips completely unless your doctor says it is okay. °· Do not take baths, swim, or use a hot tub until your doctor says it is okay. °· You may shower 24-48 hours after the procedure or as told by your doctor. °? Gently wash the area with plain soap and water. °? Pat the area dry with a clean towel. °? Do not rub the area. This may cause bleeding. °· Do not apply powder or lotion to the area. Keep the area clean and dry. °· Check your insertion area every day for signs of infection. Check for: °? More redness, swelling, or pain. °? Fluid or blood. °? Warmth. °? Pus or a bad smell. °Activity °· Rest as told by your doctor, usually for 1-2 days. °· Do not lift anything that is heavier than 10 lbs. (4.5 kg) or as told by your doctor. °· Do not drive for 24 hours if you were given a medicine to help you relax (sedative). °· Do not drive or use heavy machinery while taking prescription pain medicine. °General instructions ° °· Go back to your normal activities as told by your doctor, usually in about a week. Ask your doctor what activities are safe for you. °· If the insertion area starts to bleed, lie flat and put  pressure on the area. If the bleeding does not stop, get help right away. This is an emergency. °· Drink enough fluid to keep your pee (urine) clear or pale yellow. °· Take over-the-counter and prescription medicines only as told by your doctor. °· Keep all follow-up visits as told by your doctor. This is important. °Contact a doctor if: °· You have a fever. °· You have chills. °· You have more redness, swelling, or pain around your insertion area. °· You have fluid or blood coming from your insertion area. °· The insertion area feels warm to the touch. °· You have pus or a bad smell coming from your insertion area. °· You have more bruising around the insertion area. °· Blood collects in the tissue around the insertion area (hematoma) that may be painful to the touch. °Get help right away if: °· You have a lot of pain in the insertion area. °· The insertion area swells very fast. °· The insertion area is bleeding, and the bleeding does not stop after holding steady pressure on the area. °· The area near or just beyond the insertion area becomes pale, cool, tingly, or numb. °These symptoms may be an emergency. Do not wait to see if the symptoms will go away. Get medical help right away. Call your local emergency services (911 in the [place] Do not drive yourself to the hospital. °  Summary °· After the procedure, it is common to have bruising and tenderness at the long, thin tube insertion area. °· After the procedure, it is important to rest and drink plenty of fluids. °· Do not take baths, swim, or use a hot tub until your doctor says it is okay to do so. You may shower 24-48 hours after the procedure or as told by your doctor. °· If the insertion area starts to bleed, lie flat and put pressure on the area. If the bleeding does not stop, get help right away. This is an emergency. °This information is not intended to replace advice given to you by your health care provider. Make sure you discuss any questions you have  with your health care provider. °Document Released: 12/20/2008 Document Revised: 09/17/2016 Document Reviewed: 09/17/2016 °Elsevier Interactive Patient Education © 2019 [employer] Inc[employer] ° ° ° °Moderate Conscious Sedation, Adult, Care After °These instructions provide you with information about caring for yourself after your procedure. Your health care provider may also give you more specific instructions. Your treatment has been planned according to current medical practices, but problems sometimes occur. Call your health care provider if you have any problems or questions after your procedure. °What can I expect after the procedure? °After your procedure, it is common: °· To feel sleepy for several hours. °· To feel clumsy and have poor balance for several hours. °· To have poor judgment for several hours. °· To vomit if you eat too soon. °Follow these instructions at home: °For at least 24 hours after the procedure: ° °· Do not: °? Participate in activities where you could fall or become injured. °? Drive. °? Use heavy machinery. °? Drink alcohol. °? Take sleeping pills or medicines that cause drowsiness. °? Make important decisions or sign legal documents. °? Take care of children on your own. °· Rest. °Eating and drinking °· Follow the diet recommended by your health care provider. °· If you vomit: °? Drink water, juice, or soup when you can drink without vomiting. °? Make sure you have little or no nausea before eating solid foods. °General instructions °· Have a responsible adult stay with you until you are awake and alert. °· Take over-the-counter and prescription medicines only as told by your health care provider. °· If you smoke, do not smoke without supervision. °· Keep all follow-up visits as told by your health care provider. This is important. °Contact a health care provider if: °· You keep feeling nauseous or you keep vomiting. °· You feel light-headed. °· You develop a rash. °· You have a fever. °Get  help right away if: °· You have trouble breathing. °This information is not intended to replace advice given to you by your health care provider. Make sure you discuss any questions you have with your health care provider. °Document Released: 07/14/2013 Document Revised: 02/26/2016 Document Reviewed: 01/13/2016 °Elsevier Interactive Patient Education © 2019 Elsevier Inc. ° °

## 2018-11-25 NOTE — H&P (Signed)
 [facility] History & Physical Update  The patient was interviewed and re-examined.  [name] previous History and Physical has been reviewed and is unchanged.  There is no change in the plan of care. We plan to proceed with the scheduled procedure.  Festus Barren, MD  11/25/2018, 8:20 AM

## 2018-11-25 NOTE — H&P (Signed)
[facility] Admission History & Physical  MRN : [id number]  Austin [name] is a 63 y.o. (11-19-1955) male who presents with chief complaint of No chief complaint on file. Marland Kitchen  History of Present Illness: Patient presents for treatment of his significant right lower extremity arterial insufficiency.  Has already lost his left leg.  Has pain in his right foot and lower leg.  Noninvasive studies show right SFA occlusion.  Left leg amputation seems to be healing reasonably well.  Current Facility-Administered Medications  Medication Dose Route Frequency Provider Last Rate Last Dose  . 0.9 %  sodium chloride infusion   Intravenous Continuous Stegmayer, Kimberly A, PA-C      . ceFAZolin (ANCEF) 2-4 GM/100ML-% IVPB           . ceFAZolin (ANCEF) IVPB 2g/100 mL premix  2 g Intravenous Once Stegmayer, Kimberly A, PA-C      . diphenhydrAMINE (BENADRYL) injection 50 mg  50 mg Intravenous Once Stegmayer, Kimberly A, PA-C      . famotidine (PEPCID) tablet 40 mg  40 mg Oral PRN Stegmayer, Kimberly A, PA-C      . fentaNYL (SUBLIMAZE) 100 MCG/2ML injection           . heparin 1000 UNIT/ML injection           . HYDROmorphone (DILAUDID) injection 1 mg  1 mg Intravenous Once PRN Stegmayer, Kimberly A, PA-C      . lidocaine-EPINEPHrine (XYLOCAINE-EPINEPHrine) 1 %-1:200000 (PF) injection           . methylPREDNISolone sodium succinate (SOLU-MEDROL) 125 mg/2 mL injection 125 mg  125 mg Intravenous PRN Stegmayer, Kimberly A, PA-C      . midazolam (VERSED) 2 MG/ML syrup 8 mg  8 mg Oral Once PRN Stegmayer, Kimberly A, PA-C      . midazolam (VERSED) 5 MG/5ML injection           . ondansetron (ZOFRAN) injection 4 mg  4 mg Intravenous Q6H PRN Stegmayer, Ranae Plumber, PA-C        Past Medical History:  Diagnosis Date  . Diabetes (HCC)   . DVT (deep venous thrombosis) (HCC)     Past Surgical History:  Procedure Laterality Date  . AMPUTATION Left 10/28/2018   Procedure: AMPUTATION BELOW  KNEE;  Surgeon: Annice Needy, MD;  Location: [facility];  Service: Vascular;  Laterality: Left;  . AMPUTATION TOE Left 02/10/2018   Procedure: AMPUTATION TOE;  Surgeon: Linus Galas, DPM;  Location: [facility];  Service: Podiatry;  Laterality: Left;  . APPLICATION OF WOUND VAC Left 10/16/2018   Procedure: APPLICATION OF WOUND VAC;  Surgeon: Gwyneth Revels, DPM;  Location: [facility];  Service: Podiatry;  Laterality: Left;  . groin surgery    . IRRIGATION AND DEBRIDEMENT FOOT Left 09/30/2018   Procedure: IRRIGATION AND DEBRIDEMENT FOOT;  Surgeon: Linus Galas, DPM;  Location: [facility];  Service: Podiatry;  Laterality: Left;  . LOWER EXTREMITY ANGIOGRAPHY Left 02/12/2018   Procedure: Lower Extremity Angiography;  Surgeon: Annice Needy, MD;  Location: [facility];  Service: Cardiovascular;  Laterality: Left;  . LOWER EXTREMITY ANGIOGRAPHY Left 10/01/2018   Procedure: Lower Extremity Angiography;  Surgeon: Annice Needy, MD;  Location: [facility];  Service: Cardiovascular;  Laterality: Left;  . LOWER EXTREMITY ANGIOGRAPHY Left 10/19/2018   Procedure: Lower Extremity Angiography;  Surgeon: Annice Needy, MD;  Location: [facility];  Service: Cardiovascular;  Laterality: Left;  . LOWER  EXTREMITY ANGIOGRAPHY Left 10/20/2018   Procedure: LOWER EXTREMITY ANGIOGRAPHY;  Surgeon: Annice Needyew, Oleta Gunnoe S, MD;  Location: [facility];  Service: Cardiovascular;  Laterality: Left;  . LOWER EXTREMITY INTERVENTION  02/12/2018   Procedure: LOWER EXTREMITY INTERVENTION;  Surgeon: Annice Needyew, Arnesia Vincelette S, MD;  Location: [facility];  Service: Cardiovascular;;  . NECK SURGERY    . TRANSMETATARSAL AMPUTATION Left 10/16/2018   Procedure: TRANSMETATARSAL AMPUTATION LEFT FOOT;  Surgeon: Gwyneth RevelsFowler, Justin, DPM;  Location: [facility];  Service: Podiatry;  Laterality: Left;    Social History Social History   Tobacco Use  . Smoking status: Former Smoker    Packs/day: 1.00    Years: 30.00    Pack years: 30.00     Types: Cigarettes    Last attempt to quit: 10/07/2018    Years since quitting: 0.1  . Smokeless tobacco: Never Used  Substance Use Topics  . Alcohol use: No  . Drug use: No    Family History Family History  Problem Relation Age of Onset  . Diabetes Mother   . Coronary artery disease Father   . Hypertension Sister   . Leukemia Brother     No Known Allergies   REVIEW OF SYSTEMS (Negative unless checked)  Constitutional: [] Weight loss  [] Fever  [] Chills Cardiac: [] Chest pain   [] Chest pressure   [] Palpitations   [] Shortness of breath when laying flat   [] Shortness of breath at rest   [] Shortness of breath with exertion. Vascular:  [x] Pain in legs with walking   [] Pain in legs at rest   [] Pain in legs when laying flat   [] Claudication   [] Pain in feet when walking  [x] Pain in feet at rest  [] Pain in feet when laying flat   [] History of DVT   [] Phlebitis   [] Swelling in legs   [] Varicose veins   [] Non-healing ulcers Pulmonary:   [] Uses home oxygen   [] Productive cough   [] Hemoptysis   [] Wheeze  [] COPD   [] Asthma Neurologic:  [] Dizziness  [] Blackouts   [] Seizures   [] History of stroke   [] History of TIA  [] Aphasia   [] Temporary blindness   [] Dysphagia   [] Weakness or numbness in arms   [] Weakness or numbness in legs Musculoskeletal:  [] Arthritis   [] Joint swelling   [] Joint pain   [] Low back pain Hematologic:  [] Easy bruising  [] Easy bleeding   [] Hypercoagulable state   [] Anemic  [] Hepatitis Gastrointestinal:  [] Blood in stool   [] Vomiting blood  [] Gastroesophageal reflux/heartburn   [] Difficulty swallowing. Genitourinary:  [] Chronic kidney disease   [] Difficult urination  [] Frequent urination  [] Burning with urination   [] Blood in urine Skin:  [] Rashes   [x] Ulcers   [x] Wounds Psychological:  [] History of anxiety   []  History of major depression.  Physical Examination  Vitals:   11/25/18 0821  BP: 130/71  Pulse: 87  Resp: 18  Temp: 97.6 F (36.4 C)  TempSrc: Oral  SpO2: 100%   Weight: 90.7 kg  Height: 6\' 4"  (1.93 m)   Body mass index is 24.34 kg/m. Gen: WD/WN, NAD Head: [place]/AT, No temporalis wasting. Ear/Nose/Throat: Hearing grossly intact, nares w/o erythema or drainage, oropharynx w/o Erythema/Exudate,  Eyes: Conjunctiva clear, sclera non-icteric Neck: Trachea midline.  No JVD.  Pulmonary:  Good air movement, respirations not labored, no use of accessory muscles.  Cardiac: RRR, normal S1, S2. Vascular:  Vessel Right Left  Radial Palpable Palpable  PT  not palpable  not palpable  DP  not palpable  not palpable    Musculoskeletal: M/S 5/5 throughout.  Left leg amputation healing well Neurologic: Sensation grossly intact in extremities.  Symmetrical.  Speech is fluent. Motor exam as listed above. Psychiatric: Judgment intact, Mood & affect appropriate for pt's clinical situation. Dermatologic: No rashes or ulcers noted.  No cellulitis or open wounds.      CBC Lab Results  Component Value Date   WBC 10.0 10/30/2018   HGB 7.1 (L) 10/30/2018   HCT 22.3 (L) 10/30/2018   MCV 88.8 10/30/2018   PLT 213 10/30/2018    BMET    Component Value Date/Time   NA 138 10/30/2018 0456   NA 135 (L) 11/17/2013 0621   K 4.7 10/30/2018 0456   K 4.3 11/17/2013 0621   CL 105 10/30/2018 0456   CL 99 11/17/2013 0621   CO2 28 10/30/2018 0456   CO2 31 11/17/2013 0621   GLUCOSE 212 (H) 10/30/2018 0456   GLUCOSE 172 (H) 11/17/2013 0621   BUN 10 11/25/2018 0818   BUN 10 11/17/2013 0621   CREATININE 0.64 11/25/2018 0818   CREATININE 0.85 11/17/2013 0621   CALCIUM 8.0 (L) 10/30/2018 0456   CALCIUM 9.6 11/17/2013 0621   GFRNONAA >60 11/25/2018 0818   GFRNONAA >60 11/17/2013 0621   GFRAA >60 11/25/2018 0818   GFRAA >60 11/17/2013 0621   Estimated Creatinine Clearance: 116 mL/min (by C-G formula based on SCr of 0.64 mg/dL).  COAG Lab Results  Component Value Date   INR 1.10 10/28/2018   INR 1.16 10/23/2018    Radiology Vas  [place] Abi With/wo Tbi  Result Date: 11/20/2018 LOWER EXTREMITY DOPPLER STUDY Indications: Peripheral artery disease.  Vascular Interventions: 10/20/2018 PTA of PTA and peroneal trunk. Stent popliteal                         artery and tibioperoneal trunk and distal popliteal.                         Left thrombectomy of SFA, popliteal, tibioperoneal trunk                         and distal popliteal artery. Comparison Study: 05/22/2017 Performing Technologist: Reece Agar RT (R)(VS)  Examination Guidelines: A complete evaluation includes at minimum, Doppler waveform signals and systolic blood pressure reading at the level of bilateral brachial, anterior tibial, and posterior tibial arteries, when vessel segments are accessible. Bilateral testing is considered an integral part of a complete examination. Photoelectric Plethysmograph (PPG) waveforms and toe systolic pressure readings are included as required and additional duplex testing as needed. Limited examinations for reoccurring indications may be performed as noted.  ABI Findings: +---------+------------------+-----+----------+---------+ Right    Rt Pressure (mmHg)IndexWaveform  Comment   +---------+------------------+-----+----------+---------+ Brachial 127                                        +---------+------------------+-----+----------+---------+ ATA      86                0.63 monophasic          +---------+------------------+-----+----------+---------+ PTA      80                0.59 monophasichyperemic +---------+------------------+-----+----------+---------+ [place]  Toe65                0.48 Abnormal            +---------+------------------+-----+----------+---------+ +--------+------------------+-----+--------+-------+ Left    Lt Pressure (mmHg)IndexWaveformComment +--------+------------------+-----+--------+-------+ [id number]                                     +--------+------------------+-----+--------+-------+ +-------+-----------+-----------+------------+------------+ ABI/TBIToday's ABIToday's TBIPrevious ABIPrevious TBI +-------+-----------+-----------+------------+------------+ Right  .63        BKA        .69         .56          +-------+-----------+-----------+------------+------------+ Left   .48        BKA        .77         .55          +-------+-----------+-----------+------------+------------+ Summary: Right: Resting right ankle-brachial index indicates moderate right lower extremity arterial disease. The right toe-brachial index is abnormal. Left: Below knee amputation on 10/28/2018.  *See table(s) above for measurements and observations.  Electronically signed by Festus Barren MD on 11/20/2018 at 2:21:05 PM.   Final    Vas [place] Lower Extremity Arterial Duplex  Result Date: 11/20/2018 LOWER EXTREMITY ARTERIAL DUPLEX STUDY Indications: Rest pain, and peripheral artery disease.  Vascular Interventions: 10/20/2018 PTA of PTA and peroneal trunk. Stent popliteal                         artery and tibioperoneal trunk and distal popliteal.                         Left thrombectomy of SFA, popliteal and tibioperoneal                         trunl and PTA. 10/28/2018 Left BKA. Current ABI:            Right = .63 Left = BKA Comparison Study: 05/22/2017 Performing Technologist: Reece Agar RT (R)(VS)  Examination Guidelines: A complete evaluation includes B-mode imaging, spectral Doppler, color Doppler, and power Doppler as needed of all accessible portions of each vessel. Bilateral testing is considered an integral part of a complete examination. Limited examinations for reoccurring indications may be performed as noted.  Right Duplex Findings: +----------+--------+-----+--------+----------+-------------------------+           PSV cm/sRatioStenosisWaveform  Comments                   +----------+--------+-----+--------+----------+-------------------------+ CFA Prox  120                  monophasiccalcification             +----------+--------+-----+--------+----------+-------------------------+ DFA       70                   monophasic                          +----------+--------+-----+--------+----------+-------------------------+ SFA Prox  199                  monophasiccalcification             +----------+--------+-----+--------+----------+-------------------------+ SFA Mid   38           occluded          collateral, calcification +----------+--------+-----+--------+----------+-------------------------+  SFA Distal55           occluded                                    +----------+--------+-----+--------+----------+-------------------------+ POP Prox  92                   monophasic                          +----------+--------+-----+--------+----------+-------------------------+ POP Distal55                   monophasic                          +----------+--------+-----+--------+----------+-------------------------+ ATA Distal12                   monophasic                          +----------+--------+-----+--------+----------+-------------------------+ PTA Distal26                   monophasic                          +----------+--------+-----+--------+----------+-------------------------+  Left Duplex Findings: +----------+--------+-----+--------+--------+--------+           PSV cm/sRatioStenosisWaveformComments +----------+--------+-----+--------+--------+--------+ SFA Prox               occluded        Stent    +----------+--------+-----+--------+--------+--------+ SFA Mid                occluded        Stent    +----------+--------+-----+--------+--------+--------+ SFA Distal             occluded        Stent    +----------+--------+-----+--------+--------+--------+  Summary: Right: Total  occlusion noted in the superficial femoral artery. Left: Total occlusion noted in the superficial femoral artery. Left SFA stent is occluded.  See table(s) above for measurements and observations. Electronically signed by Festus Barren MD on 11/20/2018 at 2:21:08 PM.    Final     Assessment/Plan 1.  Severe peripheral arterial disease.  Has already lost his left leg.  Is being brought in for right leg angiogram and possible revascularization. 2.  Diabetes.  Stable on outpatient medications and blood glucose control important in reducing the progression of atherosclerotic disease. Also, involved in wound healing. On appropriate medications. 3.  Left below-knee amputation.  Seems to be healing reasonably well.   Festus Barren, MD  11/25/2018 9:09 AM

## 2018-11-25 NOTE — Op Note (Signed)
[name]  Percutaneous Study/Intervention Procedural Note   Date of Surgery: 11/25/2018  Surgeon(s):,    Assistants:none  Pre-operative Diagnosis: PAD with rest Austin [name] right lower extremity  Post-operative diagnosis:  Same  Procedure(s) Performed:             1.  Ultrasound guidance for vascular access left femoral artery             2.  Catheter placement into right common femoral artery from left femoral approach             3.  Aortogram and selective right lower extremity angiogram             4.  Percutaneous transluminal angioplasty of right tibioperoneal trunk and distal popliteal artery with 4 mm diameter by 15 cm length [name] drug-coated angioplasty balloon             5.   Percutaneous transluminal angioplasty of the entire right SFA and the proximal and mid popliteal arteries with 5 mm diameter by 22 cm length [name] drug-coated angioplasty balloon (used twice) and a 5 mm diameter by 15 cm length [name] drug-coated angioplasty balloon  6. StarClose closure device left femoral artery  EBL: 5 cc  Contrast: 50 cc  Fluoro Time: 10.2 minutes  Moderate Conscious Sedation Time: approximately 50 minutes using 4 mg of Versed and 100 Mcg of Fentanyl              Indications:  Patient is a 63 y.o.male with severe peripheral arterial disease and Austin [name] in the right foot. The patient has noninvasive study showing right SFA occlusion with reduced perfusion.  He has already lost his left leg. The patient is brought in for angiography for further evaluation and potential treatment.  Due to the limb threatening nature of the situation, angiogram was performed for attempted limb salvage. The patient is aware that if the procedure fails, amputation would be expected.  The patient also understands that even with successful revascularization, amputation may still be required due to the severity of the situation.  Risks and benefits are discussed and informed  consent is obtained.   Procedure:  The patient was identified and appropriate procedural time out was performed.  The patient was then placed supine on the table and prepped and draped in the usual sterile fashion. Moderate conscious sedation was administered during a face to face encounter with the patient throughout the procedure with my supervision of the RN administering medicines and monitoring the patient's vital signs, pulse oximetry, telemetry and mental status throughout from the start of the procedure until the patient was taken to the recovery room. Ultrasound was used to evaluate the left common femoral artery.  It was patent .  A digital ultrasound image was acquired.  A Seldinger needle was used to access the left common femoral artery under direct ultrasound guidance and a permanent image was performed.  A 0.035 J wire was advanced without resistance and a 5Fr sheath was placed.  Pigtail catheter was placed into the aorta and an AP aortogram was performed. This demonstrated that the right renal artery is not seen.  Left renal artery is widely patent.  Aorta and iliac arteries are patent without significant stenosis including the previously placed right iliac stents . I then crossed the aortic bifurcation and advanced to the right femoral head. Selective right lower extremity angiogram was then performed. This demonstrated flush occlusion of the right SFA with occlusion of the entire SFA  and popliteal arteries as well as occlusion of the proximal portion of the anterior tibial artery and the tibioperoneal trunk.  The posterior tibial artery and peroneal arteries were the best runoff distally after reconstitution from the tibial occlusion. It was felt that it was in the patient's best interest to proceed with intervention after these images to avoid a second procedure and a larger amount of contrast and fluoroscopy based off of the findings from the initial angiogram. The patient was systemically  heparinized and a 6 [place] Ansell sheath was then placed over the [name] wire. I then used a Kumpe catheter and the advantage wire to navigate into the SFA occlusion and cross it without difficulty with a CXI catheter and the advantage wire confirming intraluminal flow in the peroneal artery distally.  A 0.018 wire was then placed.  The tibioperoneal trunk and most distal popliteal artery was treated with a 4 mm diameter by 15 cm length [name] drug-coated angioplasty balloon inflated to 10 atm for 1 minute.  For the long SFA and popliteal occlusion I started with a 5 mm diameter by 22 cm length [name] drug-coated angioplasty balloon from just below the knee up to Austin [name]'s canal.  The first inflation was 8 atm for 1 minute.  It was then pulled back to the mid to distal SFA and inflated to 10 atm for 1 minute.  Another 5 mm diameter by 15 cm length [name] drug-coated angioplasty balloon was then used to treat the proximal SFA up to the common femoral artery.  This was inflated to 8 atm for 1 minute.  Completion imaging following these angioplasty showed no greater than 50% residual stenosis throughout the SFA or popliteal arteries or the tibioperoneal trunk.  The tibioperoneal trunk and popliteal artery had no greater than 20% residual stenosis.  The proximal SFA had one area with about 30% residual stenosis and there was brisk flow distally with maintained runoff in the tibial vessels. I elected to terminate the procedure. The sheath was removed and StarClose closure device was deployed in the left femoral artery with excellent hemostatic result. The patient was taken to the recovery room in stable condition having tolerated the procedure well.  Findings:               Aortogram:  Right renal artery is not seen.  Left renal artery is widely patent.  Aorta and iliac arteries are patent without significant stenosis including the previously placed right iliac stents             Right lower Extremity:   This demonstrated flush occlusion of the right SFA with occlusion of the entire SFA and popliteal arteries as well as occlusion of the proximal portion of the anterior tibial artery and the tibioperoneal trunk.  The posterior tibial artery and peroneal arteries were the best runoff distally after reconstitution from the tibial occlusion   Disposition: Patient was taken to the recovery room in stable condition having tolerated the procedure well.  Complications: None  Austin Austin [name] 11/25/2018 10:16 AM   This note was created with Dragon Medical transcription system. Any errors in dictation are purely unintentional.

## 2018-11-26 ENCOUNTER — Encounter: Payer: Self-pay | Admitting: Vascular Surgery

## 2018-11-26 ENCOUNTER — Ambulatory Visit (INDEPENDENT_AMBULATORY_CARE_PROVIDER_SITE_OTHER): Payer: Medicare Other | Admitting: Nurse Practitioner

## 2018-11-30 ENCOUNTER — Encounter (INDEPENDENT_AMBULATORY_CARE_PROVIDER_SITE_OTHER): Payer: Self-pay | Admitting: Nurse Practitioner

## 2018-11-30 DIAGNOSIS — Z89519 Acquired absence of unspecified leg below knee: Secondary | ICD-10-CM | POA: Insufficient documentation

## 2018-11-30 NOTE — Progress Notes (Signed)
SUBJECTIVE:  Patient ID: Austin [name], male    DOB: 04/27/1956, 63 y.o.   MRN: [id number] Chief Complaint  Patient presents with  . Follow-up    HPI  Austin [name] is a 63 y.o. male that returns for follow-up with ABIs and staple removal and left below-knee amputation.  The patient complains of some phantom limb pain.  He denies any fever, chills, nausea, vomiting or diarrhea.  He denies any chest pain or shortness of breath.  He denies any TIA-like symptoms.  He denies any wounds on the right lower extremity.  He denies any chest pain or shortness of breath.  He endorses having some claudication-like symptoms in his right lower extremity.  His below-knee amputation is well approximated and has little eschar.  He denies  large amounts of drainage.  The patient underwent ABIs today which reveal an ABI 0.63 on the right previously 0.69 done on 05/22/2017.  The tibial arteries have monophasic waveforms.  There was also a right lower extremity duplex done which revealed occlusions in the mid to distal SFA.  Past Medical History:  Diagnosis Date  . Diabetes (HCC)   . DVT (deep venous thrombosis) (HCC)     Past Surgical History:  Procedure Laterality Date  . AMPUTATION Left 10/28/2018   Procedure: AMPUTATION BELOW KNEE;  Surgeon: Annice Needyew, Jason S, MD;  Location: [facility];  Service: Vascular;  Laterality: Left;  . AMPUTATION TOE Left 02/10/2018   Procedure: AMPUTATION TOE;  Surgeon: Linus Galasline, Todd, DPM;  Location: [facility];  Service: Podiatry;  Laterality: Left;  . APPLICATION OF WOUND VAC Left 10/16/2018   Procedure: APPLICATION OF WOUND VAC;  Surgeon: Gwyneth RevelsFowler, Justin, DPM;  Location: [facility];  Service: Podiatry;  Laterality: Left;  . groin surgery    . IRRIGATION AND DEBRIDEMENT FOOT Left 09/30/2018   Procedure: IRRIGATION AND DEBRIDEMENT FOOT;  Surgeon: Linus Galasline, Todd, DPM;  Location: [facility];  Service: Podiatry;  Laterality: Left;  . LOWER EXTREMITY ANGIOGRAPHY Left 02/12/2018   Procedure:  Lower Extremity Angiography;  Surgeon: Annice Needyew, Jason S, MD;  Location: [facility];  Service: Cardiovascular;  Laterality: Left;  . LOWER EXTREMITY ANGIOGRAPHY Left 10/01/2018   Procedure: Lower Extremity Angiography;  Surgeon: Annice Needyew, Jason S, MD;  Location: [facility];  Service: Cardiovascular;  Laterality: Left;  . LOWER EXTREMITY ANGIOGRAPHY Left 10/19/2018   Procedure: Lower Extremity Angiography;  Surgeon: Annice Needyew, Jason S, MD;  Location: [facility];  Service: Cardiovascular;  Laterality: Left;  . LOWER EXTREMITY ANGIOGRAPHY Left 10/20/2018   Procedure: LOWER EXTREMITY ANGIOGRAPHY;  Surgeon: Annice Needyew, Jason S, MD;  Location: [facility];  Service: Cardiovascular;  Laterality: Left;  . LOWER EXTREMITY ANGIOGRAPHY Right 11/25/2018   Procedure: LOWER EXTREMITY ANGIOGRAPHY;  Surgeon: Annice Needyew, Jason S, MD;  Location: [facility];  Service: Cardiovascular;  Laterality: Right;  . LOWER EXTREMITY INTERVENTION  02/12/2018   Procedure: LOWER EXTREMITY INTERVENTION;  Surgeon: Annice Needyew, Jason S, MD;  Location: [facility];  Service: Cardiovascular;;  . NECK SURGERY    . TRANSMETATARSAL AMPUTATION Left 10/16/2018   Procedure: TRANSMETATARSAL AMPUTATION LEFT FOOT;  Surgeon: Gwyneth RevelsFowler, Justin, DPM;  Location: [facility];  Service: Podiatry;  Laterality: Left;    Social History   Socioeconomic History  . Marital status: Divorced    Spouse name: Not on file  . Number of children: Not on file  . Years of education: Not on file  . Highest education level: Not on file  Occupational History  . Not on file  Social Needs  . Financial resource strain: Not on file  . Food insecurity:    Worry: Not on file    Inability: Not on file  . Transportation needs:    Medical: Not on file    Non-medical: Not on file  Tobacco Use  . Smoking status: Former Smoker    Packs/day: 1.00    Years: 30.00    Pack years: 30.00    Types: Cigarettes    Last attempt to quit: 10/07/2018    Years  since quitting: 0.1  . Smokeless tobacco: Never Used  Substance and Sexual Activity  . Alcohol use: No  . Drug use: No  . Sexual activity: Not Currently  Lifestyle  . Physical activity:    Days per week: Not on file    Minutes per session: Not on file  . Stress: Not on file  Relationships  . Social connections:    Talks on phone: Not on file    Gets together: Not on file    Attends religious service: Not on file    Active member of club or organization: Not on file    Attends meetings of clubs or organizations: Not on file    Relationship status: Not on file  . Intimate partner violence:    Fear of current or ex partner: Not on file    Emotionally abused: Not on file    Physically abused: Not on file    Forced sexual activity: Not on file  Other Topics Concern  . Not on file  Social History Narrative  . Not on file    Family History  Problem Relation Age of Onset  . Diabetes Mother   . Coronary artery disease Father   . Hypertension Sister   . Leukemia Brother     No Known Allergies   Review of Systems   Review of Systems: Negative Unless Checked Constitutional: Weight loss  Fever  Chills Cardiac: Chest pain    Atrial Fibrillation  Palpitations   Shortness of breath when laying flat   Shortness of breath with exertion. Shortness of breath at rest Vascular:  Pain in legs with walking   Pain in legs with standing Pain in legs when laying flat   Claudication    Pain in feet when laying flat    History of DVT   Phlebitis   Swelling in legs   Varicose veins   Non-healing ulcers Pulmonary:   Uses home oxygen   Productive cough   Hemoptysis   Wheeze  COPD   Asthma Neurologic:  Dizziness   Seizures  Blackouts History of stroke   History of TIA  Aphasia   Temporary Blindness   Weakness or numbness in arm   Weakness or numbness in leg Musculoskeletal:   Joint swelling   Joint pain   Low back pain    History of Knee Replacement Arthritis back Surgeries   Spinal Stenosis    Hematologic:  Easy bruising  Easy bleeding   Hypercoagulable state   Anemic Gastrointestinal:  Diarrhea   Vomiting  Gastroesophageal reflux/heartburn   Difficulty swallowing. Abdominal pain Genitourinary:  Chronic kidney disease   Difficult urination  Anuric   Blood in urine Frequent urination  Burning with urination   Hematuria Skin:  Rashes   Ulcers Wounds Psychological:  History of anxiety    History of major depression   Memory Difficulties      OBJECTIVE:   Physical Exam  BP 104/72 (BP Location: Right Arm, Patient Position: Sitting)   Pulse 96   Resp 14   Gen: WD/WN, NAD Head: /AT, No temporalis wasting.  Ear/Nose/Throat: Hearing grossly intact, nares w/o erythema or drainage Eyes: PER, EOMI, sclera nonicteric.  Neck: Supple, no masses.  No JVD.  Pulmonary:  Good air movement, no use of accessory muscles.  Cardiac: RRR Vascular:  Wound well approximated.  Clean dry and intact.  No drainage Vessel Right Left  Radial Palpable Palpable  Dorsalis Pedis Not Palpable   Posterior Tibial Not Palpable    Gastrointestinal: soft, non-distended. No guarding/no peritoneal signs.  Musculoskeletal: M/S 5/5 throughout.  Left BKA Neurologic: Pain and light touch intact in extremities.  Symmetrical.  Speech is fluent. Motor exam as listed above. Psychiatric: Judgment intact, Mood & affect appropriate for pt's clinical situation. Dermatologic: No Venous rashes. No Ulcers Noted.  No changes consistent with cellulitis. Lymph : No Cervical lymphadenopathy, no lichenification or skin changes of chronic lymphedema.       ASSESSMENT AND PLAN:   1. Atherosclerosis of native artery of both lower extremities with intermittent claudication (HCC) Recommend:  The patient has experienced increased symptoms and is now describing lifestyle limiting claudication and mild rest  pain.   Given the severity of the patient's lower extremity symptoms the patient should undergo angiography and intervention of the right lower extremity.  Risk and benefits were reviewed the patient.  Indications for the procedure were reviewed.  All questions were answered, the patient agrees to proceed.   The patient should continue walking and begin a more formal exercise program.  The patient should continue antiplatelet therapy and aggressive treatment of the lipid abnormalities  The patient will follow up with me after the angiogram.    2. Type 2 diabetes mellitus with diabetic peripheral angiopathy without gangrene, with long-term current use of insulin (HCC) Continue hypoglycemic medications as already ordered, these medications have been reviewed and there are no changes at this time.  Hgb A1C to be monitored as already arranged by primary service   3. Mixed hyperlipidemia Continue statin as ordered and reviewed, no changes at this time  4. Status post below-knee amputation of left lower extremity (HCC) The patient is also describing phantom pain from his left lower extremity amputation.  Will try patient on neurontin to see if this helps phantom pain.   Patient will return in 1 week for removal of rest of his staples. - gabapentin (NEURONTIN) 300 MG capsule; Take 1 capsule (300 mg total) by mouth 2 (two) times daily.  Dispense: 30 capsule; Refill: 0    Current Outpatient Medications on File Prior to Visit  Medication Sig Dispense Refill  . apixaban (ELIQUIS) 5 MG TABS tablet Take 1 tablet (5 mg total) by mouth 2 (two) times daily. 60 tablet O  . atorvastatin (LIPITOR) 80 MG tablet Take 1 tablet (80 mg total) by mouth daily at 6 PM. 30 tablet 0  . clopidogrel (PLAVIX) 75 MG tablet Take 1 tablet (75 mg total) by mouth daily. 30 tablet 0  . docusate sodium (COLACE) 100 MG capsule Take 1 capsule (100 mg total) by mouth 2 (two) times daily. 20 capsule 0  . ENSURE MAX PROTEIN  (ENSURE MAX PROTEIN) LIQD Take 330 mLs (11 oz total) by mouth 2 (two) times daily.    . insulin aspart (NOVOLOG) 100 UNIT/ML injection Inject 10 Units into the skin 3 (three) times daily with meals. 10 mL 0  . insulin glargine (LANTUS) 100  UNIT/ML injection Inject 0.3 mLs (30 Units total) into the skin at bedtime. 10 mL 11  . nicotine (NICODERM CQ - DOSED IN MG/24 HOURS) 14 mg/24hr patch Place 1 patch (14 mg total) onto the skin daily. 28 patch 0  . oxyCODONE (OXY IR/ROXICODONE) 5 MG immediate release tablet Take 1 tablet (5 mg total) by mouth every 4 (four) hours as needed for moderate pain. 30 tablet 0  . pantoprazole (PROTONIX) 40 MG tablet Take 1 tablet (40 mg total) by mouth daily. 30 tablet 0  . polyethylene glycol (MIRALAX) packet Take 17 g by mouth daily. 14 each 0   No current facility-administered medications on file prior to visit.     There are no Patient Instructions on file for this visit. No follow-ups on file.   Georgiana Spinner, NP  This note was completed with Office manager.  Any errors are purely unintentional.

## 2018-12-08 ENCOUNTER — Telehealth (INDEPENDENT_AMBULATORY_CARE_PROVIDER_SITE_OTHER): Payer: Self-pay | Admitting: Vascular Surgery

## 2018-12-08 NOTE — Telephone Encounter (Signed)
Jennifer with [facility] called and said that patient missed apt with [place] on 11/26/18 due to snow. He has staples in his stump. Requesting to remove staples because his next apt with [place] is not until 01/01/19.  Spoke with Vivia Birmingham, she advised they could remove staples starting with every other, wait a week and then remove remaining staples.  Victorino Dike is aware of this advised and verbalized understanding. AS, CMA

## 2018-12-28 ENCOUNTER — Other Ambulatory Visit (INDEPENDENT_AMBULATORY_CARE_PROVIDER_SITE_OTHER): Payer: Self-pay | Admitting: Vascular Surgery

## 2018-12-28 DIAGNOSIS — Z9862 Peripheral vascular angioplasty status: Secondary | ICD-10-CM

## 2019-01-01 ENCOUNTER — Encounter (INDEPENDENT_AMBULATORY_CARE_PROVIDER_SITE_OTHER): Payer: Self-pay | Admitting: Vascular Surgery

## 2019-01-01 ENCOUNTER — Ambulatory Visit (INDEPENDENT_AMBULATORY_CARE_PROVIDER_SITE_OTHER): Payer: Medicare Other

## 2019-01-01 ENCOUNTER — Other Ambulatory Visit: Payer: Self-pay

## 2019-01-01 ENCOUNTER — Ambulatory Visit (INDEPENDENT_AMBULATORY_CARE_PROVIDER_SITE_OTHER): Payer: Medicare Other | Admitting: Vascular Surgery

## 2019-01-01 VITALS — BP 132/78 | HR 94 | Resp 16

## 2019-01-01 DIAGNOSIS — I739 Peripheral vascular disease, unspecified: Secondary | ICD-10-CM | POA: Insufficient documentation

## 2019-01-01 DIAGNOSIS — Z9862 Peripheral vascular angioplasty status: Secondary | ICD-10-CM

## 2019-01-01 DIAGNOSIS — I10 Essential (primary) hypertension: Secondary | ICD-10-CM

## 2019-01-01 DIAGNOSIS — E1151 Type 2 diabetes mellitus with diabetic peripheral angiopathy without gangrene: Secondary | ICD-10-CM

## 2019-01-01 DIAGNOSIS — Z79899 Other long term (current) drug therapy: Secondary | ICD-10-CM

## 2019-01-01 DIAGNOSIS — Z95820 Peripheral vascular angioplasty status with implants and grafts: Secondary | ICD-10-CM

## 2019-01-01 DIAGNOSIS — Z794 Long term (current) use of insulin: Secondary | ICD-10-CM

## 2019-01-01 DIAGNOSIS — I7025 Atherosclerosis of native arteries of other extremities with ulceration: Secondary | ICD-10-CM | POA: Insufficient documentation

## 2019-01-01 DIAGNOSIS — Z89512 Acquired absence of left leg below knee: Secondary | ICD-10-CM

## 2019-01-01 NOTE — Assessment & Plan Note (Signed)
blood pressure control important in reducing the progression of atherosclerotic disease. On appropriate oral medications.  

## 2019-01-01 NOTE — Patient Instructions (Signed)

## 2019-01-01 NOTE — Progress Notes (Signed)
Patient ID: ROBERTANTHONY SULLENDER, male   DOB: 01-Jan-1956, 63 y.o.   MRN: [id number]  Chief Complaint  Patient presents with  . Follow-up    [facility] 5week abi    HPI CHAYNE BLEAU is a 63 y.o. male.  Patient returns in follow-up of his peripheral arterial disease.  He is about 2 months status post left below-knee amputation for recurrent irreversible ischemia of the left lower extremity after multiple previous interventions.  He is about 1 month status post right lower extremity revascularization for rest pain.  He is now doing quite well.  His right leg pain has resolved.  His left BKA stump is basically healed at this point.  The staples have been removed.  He is still at a skilled nursing facility.  Noninvasive studies today show a right ABI of 0.93 with triphasic waveforms.   Past Medical History:  Diagnosis Date  . Diabetes (HCC)   . DVT (deep venous thrombosis) (HCC)   . Hyperlipidemia   . Hypertension   . Peripheral vascular disease [facility])     Past Surgical History:  Procedure Laterality Date  . AMPUTATION Left 10/28/2018   Procedure: AMPUTATION BELOW KNEE;  Surgeon: Annice Needy, MD;  Location: [facility];  Service: Vascular;  Laterality: Left;  . AMPUTATION TOE Left 02/10/2018   Procedure: AMPUTATION TOE;  Surgeon: Linus Galas, DPM;  Location: [facility];  Service: Podiatry;  Laterality: Left;  . APPLICATION OF WOUND VAC Left 10/16/2018   Procedure: APPLICATION OF WOUND VAC;  Surgeon: Gwyneth Revels, DPM;  Location: [facility];  Service: Podiatry;  Laterality: Left;  . groin surgery    . IRRIGATION AND DEBRIDEMENT FOOT Left 09/30/2018   Procedure: IRRIGATION AND DEBRIDEMENT FOOT;  Surgeon: Linus Galas, DPM;  Location: [facility];  Service: Podiatry;  Laterality: Left;  . LOWER EXTREMITY ANGIOGRAPHY Left 02/12/2018   Procedure: Lower Extremity Angiography;  Surgeon: Annice Needy, MD;  Location: [facility];  Service: Cardiovascular;  Laterality: Left;  . LOWER EXTREMITY ANGIOGRAPHY  Left 10/01/2018   Procedure: Lower Extremity Angiography;  Surgeon: Annice Needy, MD;  Location: [facility];  Service: Cardiovascular;  Laterality: Left;  . LOWER EXTREMITY ANGIOGRAPHY Left 10/19/2018   Procedure: Lower Extremity Angiography;  Surgeon: Annice Needy, MD;  Location: [facility];  Service: Cardiovascular;  Laterality: Left;  . LOWER EXTREMITY ANGIOGRAPHY Left 10/20/2018   Procedure: LOWER EXTREMITY ANGIOGRAPHY;  Surgeon: Annice Needy, MD;  Location: [facility];  Service: Cardiovascular;  Laterality: Left;  . LOWER EXTREMITY ANGIOGRAPHY Right 11/25/2018   Procedure: LOWER EXTREMITY ANGIOGRAPHY;  Surgeon: Annice Needy, MD;  Location: [facility];  Service: Cardiovascular;  Laterality: Right;  . LOWER EXTREMITY INTERVENTION  02/12/2018   Procedure: LOWER EXTREMITY INTERVENTION;  Surgeon: Annice Needy, MD;  Location: [facility];  Service: Cardiovascular;;  . NECK SURGERY    . TRANSMETATARSAL AMPUTATION Left 10/16/2018   Procedure: TRANSMETATARSAL AMPUTATION LEFT FOOT;  Surgeon: Gwyneth Revels, DPM;  Location: [facility];  Service: Podiatry;  Laterality: Left;      No Known Allergies  Current Outpatient Medications  Medication Sig Dispense Refill  . apixaban (ELIQUIS) 5 MG TABS tablet Take 1 tablet (5 mg total) by mouth 2 (two) times daily. 60 tablet O  . aspirin EC 81 MG tablet Take 1 tablet (81 mg total) by mouth daily. 150 tablet 2  . atorvastatin (LIPITOR) 80 MG tablet Take 1 tablet (  80 mg total) by mouth daily at 6 PM. 30 tablet 0  . clopidogrel (PLAVIX) 75 MG tablet Take 1 tablet (75 mg total) by mouth daily. 30 tablet 0  . docusate sodium (COLACE) 100 MG capsule Take 1 capsule (100 mg total) by mouth 2 (two) times daily. 20 capsule 0  . ENSURE MAX PROTEIN (ENSURE MAX PROTEIN) LIQD Take 330 mLs (11 oz total) by mouth 2 (two) times daily.    Marland Kitchen gabapentin (NEURONTIN) 300 MG capsule Take 1 capsule (300 mg total) by mouth 2 (two) times  daily. 30 capsule 0  . insulin aspart (NOVOLOG) 100 UNIT/ML injection Inject 10 Units into the skin 3 (three) times daily with meals. 10 mL 0  . insulin glargine (LANTUS) 100 UNIT/ML injection Inject 0.3 mLs (30 Units total) into the skin at bedtime. 10 mL 11  . nicotine (NICODERM CQ - DOSED IN MG/24 HOURS) 14 mg/24hr patch Place 1 patch (14 mg total) onto the skin daily. 28 patch 0  . oxyCODONE (OXY IR/ROXICODONE) 5 MG immediate release tablet Take 1 tablet (5 mg total) by mouth every 4 (four) hours as needed for moderate pain. 30 tablet 0  . pantoprazole (PROTONIX) 40 MG tablet Take 1 tablet (40 mg total) by mouth daily. 30 tablet 0  . polyethylene glycol (MIRALAX) packet Take 17 g by mouth daily. 14 each 0   No current facility-administered medications for this visit.         Physical Exam BP 132/78 (BP Location: Right Arm)   Pulse 94   Resp 16  Gen:  WD/WN, NAD Skin: incision C/D/I     Assessment/Plan:  Status post below-knee amputation (HCC) Now healed.  Referral for prosthesis made today.  Diabetes (HCC) blood glucose control important in reducing the progression of atherosclerotic disease. Also, involved in wound healing. On appropriate medications.   Essential hypertension blood pressure control important in reducing the progression of atherosclerotic disease. On appropriate oral medications.   PAD (peripheral artery disease) (HCC) Noninvasive studies today show a right ABI of 0.93 with triphasic waveforms. Doing well after revascularization of the right leg.  Plan to see him back in about 3 to 4 months with ABI.  Continue current medical regimen.      Festus Barren 01/01/2019, 12:12 PM   This note was created with Dragon medical transcription system.  Any errors from dictation are unintentional.

## 2019-01-01 NOTE — Assessment & Plan Note (Signed)
blood glucose control important in reducing the progression of atherosclerotic disease. Also, involved in wound healing. On appropriate medications.  

## 2019-01-01 NOTE — Assessment & Plan Note (Signed)
Now healed.  Referral for prosthesis made today.

## 2019-01-01 NOTE — Assessment & Plan Note (Signed)
Noninvasive studies today show a right ABI of 0.93 with triphasic waveforms. Doing well after revascularization of the right leg.  Plan to see him back in about 3 to 4 months with ABI.  Continue current medical regimen.

## 2019-03-11 ENCOUNTER — Telehealth (INDEPENDENT_AMBULATORY_CARE_PROVIDER_SITE_OTHER): Payer: Self-pay | Admitting: Vascular Surgery

## 2019-03-11 NOTE — Telephone Encounter (Signed)
Patient needs to be seen by a provider.

## 2019-03-11 NOTE — Telephone Encounter (Signed)
Michelle-PT with [facility] [phone] calling stating that she started physical therapy with patient and noticed 2 open areas along incision of residual limb. Just and FYI. AS, CMA

## 2019-03-11 NOTE — Telephone Encounter (Signed)
Please schedule patient for an apt per Selena Batten. AS, CMA

## 2019-03-12 ENCOUNTER — Telehealth (INDEPENDENT_AMBULATORY_CARE_PROVIDER_SITE_OTHER): Payer: Self-pay

## 2019-03-12 NOTE — Telephone Encounter (Signed)
Malarie Occupational Therapist from [facility]  requesting verbal order to come in the home twice a week for 4 weeks and Dr Wyn Quaker approve the verbal order

## 2019-03-15 ENCOUNTER — Other Ambulatory Visit: Payer: Self-pay

## 2019-03-15 ENCOUNTER — Encounter (INDEPENDENT_AMBULATORY_CARE_PROVIDER_SITE_OTHER): Payer: Self-pay | Admitting: Nurse Practitioner

## 2019-03-15 ENCOUNTER — Ambulatory Visit (INDEPENDENT_AMBULATORY_CARE_PROVIDER_SITE_OTHER): Payer: Medicare Other | Admitting: Nurse Practitioner

## 2019-03-15 VITALS — BP 148/81 | HR 91 | Resp 16 | Wt 208.0 lb

## 2019-03-15 DIAGNOSIS — I70213 Atherosclerosis of native arteries of extremities with intermittent claudication, bilateral legs: Secondary | ICD-10-CM

## 2019-03-15 DIAGNOSIS — Z794 Long term (current) use of insulin: Secondary | ICD-10-CM

## 2019-03-15 DIAGNOSIS — I10 Essential (primary) hypertension: Secondary | ICD-10-CM | POA: Diagnosis not present

## 2019-03-15 DIAGNOSIS — E1151 Type 2 diabetes mellitus with diabetic peripheral angiopathy without gangrene: Secondary | ICD-10-CM | POA: Diagnosis not present

## 2019-03-15 DIAGNOSIS — Z89512 Acquired absence of left leg below knee: Secondary | ICD-10-CM

## 2019-03-15 DIAGNOSIS — F17211 Nicotine dependence, cigarettes, in remission: Secondary | ICD-10-CM

## 2019-03-15 DIAGNOSIS — Z79899 Other long term (current) drug therapy: Secondary | ICD-10-CM

## 2019-03-15 NOTE — Progress Notes (Signed)
SUBJECTIVE:  Patient ID: Austin [name], male    DOB: 04/13/1956, 63 y.o.   MRN: [id number] Chief Complaint  Patient presents with  . Follow-up    check incision area    HPI  Austin [name] is a 63 y.o. male that presents today with concerns about the incision of his left below-knee amputation.  The patient states that several days ago there was an opening on his incision however at this time there does not appear to be any opening present.  He denies any fever, chills, nausea, vomiting or diarrhea.  He denies any trauma to the wound or area.  The patient is still getting used to wearing his prosthetic as well as his stump shrinker as well.  Overall, the patient denies any major issues.  Past Medical History:  Diagnosis Date  . Diabetes (HCC)   . DVT (deep venous thrombosis) (HCC)   . Hyperlipidemia   . Hypertension   . Peripheral vascular disease [facility](HCC)     Past Surgical History:  Procedure Laterality Date  . AMPUTATION Left 10/28/2018   Procedure: AMPUTATION BELOW KNEE;  Surgeon: Annice Needyew, Jason S, MD;  Location: [facility] ORS;  Service: Vascular;  Laterality: Left;  . AMPUTATION TOE Left 02/10/2018   Procedure: AMPUTATION TOE;  Surgeon: Linus Galasline, Todd, DPM;  Location: [facility] OR[facility];  Service: Podiatry;  Laterality: Left;  . APPLICATION OF WOUND VAC Left 10/16/2018   Procedure: APPLICATION OF WOUND VAC;  Surgeon: Gwyneth RevelsFowler, Justin, DPM;  Location: [facility];  Service: Podiatry;  Laterality: Left;  . groin surgery    . IRRIGATION AND DEBRIDEMENT FOOT Left 09/30/2018   Procedure: IRRIGATION AND DEBRIDEMENT FOOT;  Surgeon: Linus Galasline, Todd, DPM;  Location: [facility];  Service: Podiatry;  Laterality: Left;  . LOWER EXTREMITY ANGIOGRAPHY Left 02/12/2018   Procedure: Lower Extremity Angiography;  Surgeon: Annice Needyew, Jason S, MD;  Location: [facility];  Service: Cardiovascular;  Laterality: Left;  . LOWER EXTREMITY ANGIOGRAPHY Left 10/01/2018   Procedure: Lower Extremity Angiography;  Surgeon: Annice Needyew, Jason S,  MD;  Location: [facility];  Service: Cardiovascular;  Laterality: Left;  . LOWER EXTREMITY ANGIOGRAPHY Left 10/19/2018   Procedure: Lower Extremity Angiography;  Surgeon: Annice Needyew, Jason S, MD;  Location: [facility];  Service: Cardiovascular;  Laterality: Left;  . LOWER EXTREMITY ANGIOGRAPHY Left 10/20/2018   Procedure: LOWER EXTREMITY ANGIOGRAPHY;  Surgeon: Annice Needyew, Jason S, MD;  Location: [facility];  Service: Cardiovascular;  Laterality: Left;  . LOWER EXTREMITY ANGIOGRAPHY Right 11/25/2018   Procedure: LOWER EXTREMITY ANGIOGRAPHY;  Surgeon: Annice Needyew, Jason S, MD;  Location: [facility];  Service: Cardiovascular;  Laterality: Right;  . LOWER EXTREMITY INTERVENTION  02/12/2018   Procedure: LOWER EXTREMITY INTERVENTION;  Surgeon: Annice Needyew, Jason S, MD;  Location: [facility];  Service: Cardiovascular;;  . NECK SURGERY    . TRANSMETATARSAL AMPUTATION Left 10/16/2018   Procedure: TRANSMETATARSAL AMPUTATION LEFT FOOT;  Surgeon: Gwyneth RevelsFowler, Justin, DPM;  Location: [facility];  Service: Podiatry;  Laterality: Left;    Social History   Socioeconomic History  . Marital status: Divorced    Spouse name: Not on file  . Number of children: Not on file  . Years of education: Not on file  . Highest education level: Not on file  Occupational History  . Not on file  Social Needs  . Financial resource strain: Not on file  . Food insecurity:    Worry: Not on file    Inability: Not on  file  . Transportation needs:    Medical: Not on file    Non-medical: Not on file  Tobacco Use  . Smoking status: Former Smoker    Packs/day: 1.00    Years: 30.00    Pack years: 30.00    Types: Cigarettes    Last attempt to quit: 10/07/2018    Years since quitting: 0.4  . Smokeless tobacco: Never Used  Substance and Sexual Activity  . Alcohol use: No  . Drug use: No  . Sexual activity: Not Currently  Lifestyle  . Physical activity:    Days per week: Not on file    Minutes per session: Not  on file  . Stress: Not on file  Relationships  . Social connections:    Talks on phone: Not on file    Gets together: Not on file    Attends religious service: Not on file    Active member of club or organization: Not on file    Attends meetings of clubs or organizations: Not on file    Relationship status: Not on file  . Intimate partner violence:    Fear of current or ex partner: Not on file    Emotionally abused: Not on file    Physically abused: Not on file    Forced sexual activity: Not on file  Other Topics Concern  . Not on file  Social History Narrative  . Not on file    Family History  Problem Relation Age of Onset  . Diabetes Mother   . Coronary artery disease Father   . Hypertension Sister   . Leukemia Brother     No Known Allergies   Review of Systems   Review of Systems: Negative Unless Checked Constitutional: [] Weight loss  [] Fever  [] Chills Cardiac: [] Chest pain   []  Atrial Fibrillation  [] Palpitations   [] Shortness of breath when laying flat   [] Shortness of breath with exertion. [] Shortness of breath at rest Vascular:  [] Pain in legs with walking   [] Pain in legs with standing [] Pain in legs when laying flat   [] Claudication    [] Pain in feet when laying flat    [] History of DVT   [] Phlebitis   [] Swelling in legs   [] Varicose veins   [] Non-healing ulcers Pulmonary:   [] Uses home oxygen   [] Productive cough   [] Hemoptysis   [] Wheeze  [] COPD   [] Asthma Neurologic:  [] Dizziness   [] Seizures  [] Blackouts [] History of stroke   [] History of TIA  [] Aphasia   [] Temporary Blindness   [] Weakness or numbness in arm   [x] Weakness or numbness in leg Musculoskeletal:   [] Joint swelling   [] Joint pain   [] Low back pain  []  History of Knee Replacement [] Arthritis [] back Surgeries  []  Spinal Stenosis    Hematologic:  [] Easy bruising  [] Easy bleeding   [] Hypercoagulable state   [] Anemic Gastrointestinal:  [] Diarrhea   [] Vomiting  [] Gastroesophageal reflux/heartburn    [] Difficulty swallowing. [] Abdominal pain Genitourinary:  [] Chronic kidney disease   [] Difficult urination  [] Anuric   [] Blood in urine [] Frequent urination  [] Burning with urination   [] Hematuria Skin:  [] Rashes   [] Ulcers [] Wounds Psychological:  [] History of anxiety   []  History of major depression  []  Memory Difficulties      OBJECTIVE:   Physical Exam  BP (!) 148/81 (BP Location: Right Arm)   Pulse 91   Resp 16   Wt 208 lb (94.3 kg)   BMI 25.32 kg/m   Gen: WD/WN, NAD Head: [place]/AT, No  temporalis wasting.  Ear/Nose/Throat: Hearing grossly intact, nares w/o erythema or drainage Eyes: PER, EOMI, sclera nonicteric.  Neck: Supple, no masses.  No JVD.  Pulmonary:  Good air movement, no use of accessory muscles.  Cardiac: RRR Vascular:  Wound appears clean dry and intact, some scabbing near the medial portion of incision. Vessel Right Left  Radial Palpable Palpable   Gastrointestinal: soft, non-distended. No guarding/no peritoneal signs.  Musculoskeletal: Uses walker for ambulation.   Left below-knee amputation Neurologic: Pain and light touch intact in extremities.  Symmetrical.  Speech is fluent. Motor exam as listed above. Psychiatric: Judgment intact, Mood & affect appropriate for pt's clinical situation. Dermatologic: No Venous rashes. No Ulcers Noted.  No changes consistent with cellulitis. Lymph : No Cervical lymphadenopathy, no lichenification or skin changes of chronic lymphedema.       ASSESSMENT AND PLAN:  1. Status post below-knee amputation of left lower extremity (HCC) Patient's wound looks good and fully healed.  Patient is advised that sometimes the stump can cause some irritation to areas as the body develops calluses.  He should contact us if his wound becomes open or begins to ooze.  Otherwise we will look at his next visit.  2. Essential hypertension Continue antihypertensive medications as already ordered, these medications have been reviewed and there are  no changes at this time.   3. Type 2 diabetes mellitus with diabetic peripheral angiopathy without gangrene, with long-term current use of insulin (HCC) Continue hypoglycemic medications as already ordered, these medications have been reviewed and there are no changes at this time.  Hgb A1C to be monitored as already arranged by primary service   4. Atherosclerosis of native artery of both lower extremities with intermittent claudication (HCC) The patient should return in 6 months with ABIs in order to continue to monitor his peripheral arterial disease.   Current Outpatient Medications on File Prior to Visit  Medication Sig Dispense Refill  . apixaban (ELIQUIS) 5 MG TABS tablet Take 1 tablet (5 mg total) by mouth 2 (two) times daily. 60 tablet O  . aspirin EC 81 MG tablet Take 1 tablet (81 mg total) by mouth daily. 150 tablet 2  . atorvastatin (LIPITOR) 80 MG tablet Take 1 tablet (80 mg total) by mouth daily at 6 PM. 30 tablet 0  . clopidogrel (PLAVIX) 75 MG tablet Take 1 tablet (75 mg total) by mouth daily. 30 tablet 0  . docusate sodium (COLACE) 100 MG capsule Take 1 capsule (100 mg total) by mouth 2 (two) times daily. 20 capsule 0  . ENSURE MAX PROTEIN (ENSURE MAX PROTEIN) LIQD Take 330 mLs (11 oz total) by mouth 2 (two) times daily.    Marland Kitchen. gabapentin (NEURONTIN) 300 MG capsule Take 1 capsule (300 mg total) by mouth 2 (two) times daily. 30 capsule 0  . insulin aspart (NOVOLOG) 100 UNIT/ML injection Inject 10 Units into the skin 3 (three) times daily with meals. 10 mL 0  . insulin glargine (LANTUS) 100 UNIT/ML injection Inject 0.3 mLs (30 Units total) into the skin at bedtime. 10 mL 11  . nicotine (NICODERM CQ - DOSED IN MG/24 HOURS) 14 mg/24hr patch Place 1 patch (14 mg total) onto the skin daily. 28 patch 0  . oxyCODONE (OXY IR/ROXICODONE) 5 MG immediate release tablet Take 1 tablet (5 mg total) by mouth every 4 (four) hours as needed for moderate pain. 30 tablet 0  . pantoprazole  (PROTONIX) 40 MG tablet Take 1 tablet (40 mg total) by mouth daily.  30 tablet 0  . polyethylene glycol (MIRALAX) packet Take 17 g by mouth daily. 14 each 0   No current facility-administered medications on file prior to visit.     There are no Patient Instructions on file for this visit. Return in about 6 months (around 09/14/2019) for PAD.   Kris Hartmann, NP  This note was completed with Sales executive.  Any errors are purely unintentional.

## 2019-04-16 ENCOUNTER — Telehealth (INDEPENDENT_AMBULATORY_CARE_PROVIDER_SITE_OTHER): Payer: Self-pay

## 2019-04-16 NOTE — Telephone Encounter (Signed)
[place]  Occupational therapist from [facility] called wanting an extension of once a week for the next two weeks for the patient. I returned the call to [facility] at [phone] and a  verbal ok was given for the extended OT.

## 2019-05-04 ENCOUNTER — Ambulatory Visit (INDEPENDENT_AMBULATORY_CARE_PROVIDER_SITE_OTHER): Payer: Medicare Other | Admitting: Nurse Practitioner

## 2019-06-09 ENCOUNTER — Telehealth (INDEPENDENT_AMBULATORY_CARE_PROVIDER_SITE_OTHER): Payer: Self-pay

## 2019-06-09 NOTE — Telephone Encounter (Signed)
Bonner Puna the nurse from [facility] had called wanting to speak someone in the office about the patient anticoagulant.I return a called back to the office stayed on hold for 8 minutes but had to disconnect.

## 2019-06-30 ENCOUNTER — Telehealth (INDEPENDENT_AMBULATORY_CARE_PROVIDER_SITE_OTHER): Payer: Self-pay

## 2019-06-30 ENCOUNTER — Other Ambulatory Visit (INDEPENDENT_AMBULATORY_CARE_PROVIDER_SITE_OTHER): Payer: Self-pay | Admitting: Nurse Practitioner

## 2019-06-30 NOTE — Telephone Encounter (Signed)
The nurse from [place] had a question about the anticoagulate the patient should be on and for how long. I spoke with Eulogio Ditch NP and she advise the patient should stay on Eliquis since he has A-Fib and he can stop taking Clopidogrel and Aspirin 81mg  since it has been over 6 months.The nurse stated that she will inform the patient provider with information.

## 2019-09-16 ENCOUNTER — Ambulatory Visit (INDEPENDENT_AMBULATORY_CARE_PROVIDER_SITE_OTHER): Payer: Medicare Other

## 2019-09-16 ENCOUNTER — Ambulatory Visit (INDEPENDENT_AMBULATORY_CARE_PROVIDER_SITE_OTHER): Payer: Medicare Other | Admitting: Nurse Practitioner

## 2019-09-16 ENCOUNTER — Encounter (INDEPENDENT_AMBULATORY_CARE_PROVIDER_SITE_OTHER): Payer: Self-pay | Admitting: Nurse Practitioner

## 2019-09-16 ENCOUNTER — Other Ambulatory Visit: Payer: Self-pay

## 2019-09-16 VITALS — BP 143/78 | HR 96 | Resp 18 | Ht 76.0 in | Wt 230.0 lb

## 2019-09-16 DIAGNOSIS — I10 Essential (primary) hypertension: Secondary | ICD-10-CM

## 2019-09-16 DIAGNOSIS — I739 Peripheral vascular disease, unspecified: Secondary | ICD-10-CM

## 2019-09-16 DIAGNOSIS — Z89512 Acquired absence of left leg below knee: Secondary | ICD-10-CM | POA: Diagnosis not present

## 2019-09-16 DIAGNOSIS — E119 Type 2 diabetes mellitus without complications: Secondary | ICD-10-CM | POA: Insufficient documentation

## 2019-09-22 ENCOUNTER — Encounter (INDEPENDENT_AMBULATORY_CARE_PROVIDER_SITE_OTHER): Payer: Self-pay | Admitting: Nurse Practitioner

## 2019-09-22 NOTE — Progress Notes (Signed)
SUBJECTIVE:  Patient ID: Austin [name], male    DOB: April 17, 1956, 63 y.o.   MRN: [id number] Chief Complaint  Patient presents with  . Follow-up    ultrasound    HPI  Austin [name] is a 63 y.o. male presents today with noninvasive studies for his peripheral arterial disease.  The patient has a history of multiple leg revascularizations with a left below-knee amputation on 10/28/2018.  Currently the patient is using a prosthesis however he is currently having some issues with rubbing of the prosthesis on his stump.  He states that this is sore and it does swell him now from utilizing his prosthesis at times.  Otherwise, he denies any pain in his right lower extremity.  He denies any fever, chills, nausea, vomiting or diarrhea.  However he does note some swelling of the right lower extremity.  Today the patient underwent noninvasive studies which reveal an ABI of 0.2 on his right lower extremity.  Previous studies done on 01/01/2019 reveal an ABI 0.93.  The patient has strong monophasic waveforms in his tibial arteries with dampened right digit waveforms.  Waveforms suggest aortoiliac level disease  Past Medical History:  Diagnosis Date  . Diabetes ([place])   . DVT (deep venous thrombosis) ([place])   . Hyperlipidemia   . Hypertension   . Peripheral vascular disease [facility])     Past Surgical History:  Procedure Laterality Date  . AMPUTATION Left 10/28/2018   Procedure: AMPUTATION BELOW KNEE;  Surgeon: Algernon Huxley, MD;  Location: [facility];  Service: Vascular;  Laterality: Left;  . AMPUTATION TOE Left 02/10/2018   Procedure: AMPUTATION TOE;  Surgeon: Sharlotte Alamo, DPM;  Location: [facility];  Service: Podiatry;  Laterality: Left;  . APPLICATION OF WOUND VAC Left 10/16/2018   Procedure: APPLICATION OF WOUND VAC;  Surgeon: Samara Deist, DPM;  Location: [facility];  Service: Podiatry;  Laterality: Left;  . groin surgery    . IRRIGATION AND DEBRIDEMENT FOOT Left 09/30/2018   Procedure: IRRIGATION AND  DEBRIDEMENT FOOT;  Surgeon: Sharlotte Alamo, DPM;  Location: [facility];  Service: Podiatry;  Laterality: Left;  . LOWER EXTREMITY ANGIOGRAPHY Left 02/12/2018   Procedure: Lower Extremity Angiography;  Surgeon: Algernon Huxley, MD;  Location: [place] CV LAB;  Service: Cardiovascular;  Laterality: Left;  . LOWER EXTREMITY ANGIOGRAPHY Left 10/01/2018   Procedure: Lower Extremity Angiography;  Surgeon: Algernon Huxley, MD;  Location: [place] CV LAB;  Service: Cardiovascular;  Laterality: Left;  . LOWER EXTREMITY ANGIOGRAPHY Left 10/19/2018   Procedure: Lower Extremity Angiography;  Surgeon: Algernon Huxley, MD;  Location: [place] CV LAB;  Service: Cardiovascular;  Laterality: Left;  . LOWER EXTREMITY ANGIOGRAPHY Left 10/20/2018   Procedure: LOWER EXTREMITY ANGIOGRAPHY;  Surgeon: Algernon Huxley, MD;  Location: [place] CV LAB;  Service: Cardiovascular;  Laterality: Left;  . LOWER EXTREMITY ANGIOGRAPHY Right 11/25/2018   Procedure: LOWER EXTREMITY ANGIOGRAPHY;  Surgeon: Algernon Huxley, MD;  Location: [place] CV LAB;  Service: Cardiovascular;  Laterality: Right;  . LOWER EXTREMITY INTERVENTION  02/12/2018   Procedure: LOWER EXTREMITY INTERVENTION;  Surgeon: Algernon Huxley, MD;  Location: [place] CV LAB;  Service: Cardiovascular;;  . NECK SURGERY    . TRANSMETATARSAL AMPUTATION Left 10/16/2018   Procedure: TRANSMETATARSAL AMPUTATION LEFT FOOT;  Surgeon: Samara Deist, DPM;  Location: [facility];  Service: Podiatry;  Laterality: Left;    Social History   Socioeconomic History  . Marital status: Divorced    Spouse name: Not  on file  . Number of children: Not on file  . Years of education: Not on file  . Highest education level: Not on file  Occupational History  . Not on file  Tobacco Use  . Smoking status: Former Smoker    Packs/day: 1.00    Years: 30.00    Pack years: 30.00    Types: Cigarettes    Quit date: 10/07/2018    Years since quitting: 0.9  . Smokeless tobacco: Never Used    Substance and Sexual Activity  . Alcohol use: No  . Drug use: No  . Sexual activity: Not Currently  Other Topics Concern  . Not on file  Social History Narrative  . Not on file   Social Determinants of Health   Financial Resource Strain:   . Difficulty of Paying Living Expenses: Not on file  Food Insecurity:   . Worried About Programme researcher, broadcasting/film/videounning Out of Food in the Last Year: Not on file  . Ran Out of Food in the Last Year: Not on file  Transportation Needs:   . Lack of Transportation (Medical): Not on file  . Lack of Transportation (Non-Medical): Not on file  Physical Activity:   . Days of Exercise per Week: Not on file  . Minutes of Exercise per Session: Not on file  Stress:   . Feeling of Stress : Not on file  Social Connections:   . Frequency of Communication with Friends and Family: Not on file  . Frequency of Social Gatherings with Friends and Family: Not on file  . Attends Religious Services: Not on file  . Active Member of Clubs or Organizations: Not on file  . Attends BankerClub or Organization Meetings: Not on file  . Marital Status: Not on file  Intimate Partner Violence:   . Fear of Current or Ex-Partner: Not on file  . Emotionally Abused: Not on file  . Physically Abused: Not on file  . Sexually Abused: Not on file    Family History  Problem Relation Age of Onset  . Diabetes Mother   . Coronary artery disease Father   . Hypertension Sister   . Leukemia Brother     No Known Allergies   Review of Systems   Review of Systems: Negative Unless Checked Constitutional: [] Weight loss  [] Fever  [] Chills Cardiac: [] Chest pain   []  Atrial Fibrillation  [] Palpitations   [] Shortness of breath when laying flat   [] Shortness of breath with exertion. [] Shortness of breath at rest Vascular:  [] Pain in legs with walking   [] Pain in legs with standing [] Pain in legs when laying flat   [x] Claudication    [] Pain in feet when laying flat    [] History of DVT   [] Phlebitis   [x] Swelling in  legs   [] Varicose veins   [] Non-healing ulcers Pulmonary:   [] Uses home oxygen   [] Productive cough   [] Hemoptysis   [] Wheeze  [] COPD   [] Asthma Neurologic:  [] Dizziness   [] Seizures  [] Blackouts [] History of stroke   [] History of TIA  [] Aphasia   [] Temporary Blindness   [] Weakness or numbness in arm   [x] Weakness or numbness in leg Musculoskeletal:   [] Joint swelling   [] Joint pain   [] Low back pain  []  History of Knee Replacement [] Arthritis [] back Surgeries  []  Spinal Stenosis    Hematologic:  [] Easy bruising  [] Easy bleeding   [] Hypercoagulable state   [] Anemic Gastrointestinal:  [] Diarrhea   [] Vomiting  [] Gastroesophageal reflux/heartburn   [] Difficulty swallowing. [] Abdominal pain Genitourinary:  [] Chronic  kidney disease   [] Difficult urination  [] Anuric   [] Blood in urine [] Frequent urination  [] Burning with urination   [] Hematuria Skin:  [] Rashes   [] Ulcers [] Wounds Psychological:  [] History of anxiety   []  History of major depression  []  Memory Difficulties      OBJECTIVE:   Physical Exam  BP (!) 143/78 (BP Location: Right Arm)   Pulse 96   Resp 18   Ht 6\' 4"  (1.93 m)   Wt 230 lb (104.3 kg)   BMI 28.00 kg/m   Gen: WD/WN, NAD Head: [place]/AT, No temporalis wasting.  Ear/Nose/Throat: Hearing grossly intact, nares w/o erythema or drainage Eyes: PER, EOMI, sclera nonicteric.  Neck: Supple, no masses.  No JVD.  Pulmonary:  Good air movement, no use of accessory muscles.  Cardiac: RRR Vascular:  Vessel Right Left  Radial Palpable Palpable  Dorsalis Pedis notPalpable   Posterior Tibial Not Palpable    Gastrointestinal: soft, non-distended.  Left below-knee amputation Musculoskeletal: M/S 5/5 throughout.  No deformity or atrophy.  Neurologic: Pain and light touch intact in extremities.  Symmetrical.  Speech is fluent. Motor exam as listed above. Psychiatric: Judgment intact, Mood & affect appropriate for pt's clinical situation. Dermatologic: No Venous rashes. No Ulcers Noted.   No changes consistent with cellulitis. Lymph : No Cervical lymphadenopathy, no lichenification or skin changes of chronic lymphedema.       ASSESSMENT AND PLAN:  1. PAD (peripheral artery disease) (HCC)  Recommend:  The patient has evidence of atherosclerosis of the lower extremities with claudication.  The patient does not voice lifestyle limiting changes at this point in time.  Noninvasive studies do not suggest clinically significant change.  No invasive studies, angiography or surgery at this time The patient should continue walking and begin a more formal exercise program.  The patient should continue antiplatelet therapy and aggressive treatment of the lipid abnormalities  No changes in the patient's medications at this time  The patient should continue wearing graduated compression socks 10-15 mmHg strength to control the mild edema.    2. Essential hypertension Continue antihypertensive medications as already ordered, these medications have been reviewed and there are no changes at this time.   3. Status post below-knee amputation of left lower extremity (HCC) Patient advised to place a bandage with gauze over the area to try to see if this will help with the irritation rubbing.  If it continues and becomes excessively uncomfortable the patient should contact the prosthesis manufacture.   Current Outpatient Medications on File Prior to Visit  Medication Sig Dispense Refill  . apixaban (ELIQUIS) 5 MG TABS tablet Take 1 tablet (5 mg total) by mouth 2 (two) times daily. 60 tablet O  . atorvastatin (LIPITOR) 80 MG tablet Take 1 tablet (80 mg total) by mouth daily at 6 PM. 30 tablet 0  . ENSURE MAX PROTEIN (ENSURE MAX PROTEIN) LIQD Take 330 mLs (11 oz total) by mouth 2 (two) times daily.    gabapentin (NEURONTIN) 300 MG capsule Take 1 capsule (300 mg total) by mouth 2 (two) times daily. 30 capsule 0  . insulin aspart (NOVOLOG) 100 UNIT/ML injection Inject 10 Units into the  skin 3 (three) times daily with meals. 10 mL 0  . insulin glargine (LANTUS) 100 UNIT/ML injection Inject 0.3 mLs (30 Units total) into the skin at bedtime. 10 mL 11  . aspirin EC 81 MG tablet Take 1 tablet (81 mg total) by mouth daily. (Patient not taking: Reported on 09/16/2019) 150 tablet  2  . clopidogrel (PLAVIX) 75 MG tablet Take 1 tablet (75 mg total) by mouth daily. (Patient not taking: Reported on 09/16/2019) 30 tablet 0  . docusate sodium (COLACE) 100 MG capsule Take 1 capsule (100 mg total) by mouth 2 (two) times daily. (Patient not taking: Reported on 09/16/2019) 20 capsule 0  . nicotine (NICODERM CQ - DOSED IN MG/24 HOURS) 14 mg/24hr patch Place 1 patch (14 mg total) onto the skin daily. (Patient not taking: Reported on 09/16/2019) 28 patch 0  . oxyCODONE (OXY IR/ROXICODONE) 5 MG immediate release tablet Take 1 tablet (5 mg total) by mouth every 4 (four) hours as needed for moderate pain. (Patient not taking: Reported on 09/16/2019) 30 tablet 0  . pantoprazole (PROTONIX) 40 MG tablet Take 1 tablet (40 mg total) by mouth daily. (Patient not taking: Reported on 09/16/2019) 30 tablet 0  . polyethylene glycol (MIRALAX) packet Take 17 g by mouth daily. (Patient not taking: Reported on 09/16/2019) 14 each 0   No current facility-administered medications on file prior to visit.    There are no Patient Instructions on file for this visit. No follow-ups on file.   Georgiana Spinner, NP  This note was completed with Office manager.  Any errors are purely unintentional.

## 2019-11-26 ENCOUNTER — Ambulatory Visit (INDEPENDENT_AMBULATORY_CARE_PROVIDER_SITE_OTHER): Payer: Medicare Other | Admitting: Nurse Practitioner

## 2019-12-04 IMAGING — DX DG CHEST 1V PORT
1 series · 1 of 1 positions shown · non-contrast
Comparison: None.

CLINICAL DATA: Bedside PICC placement.

EXAM:
PORTABLE CHEST 1 VIEW

[chest ap]
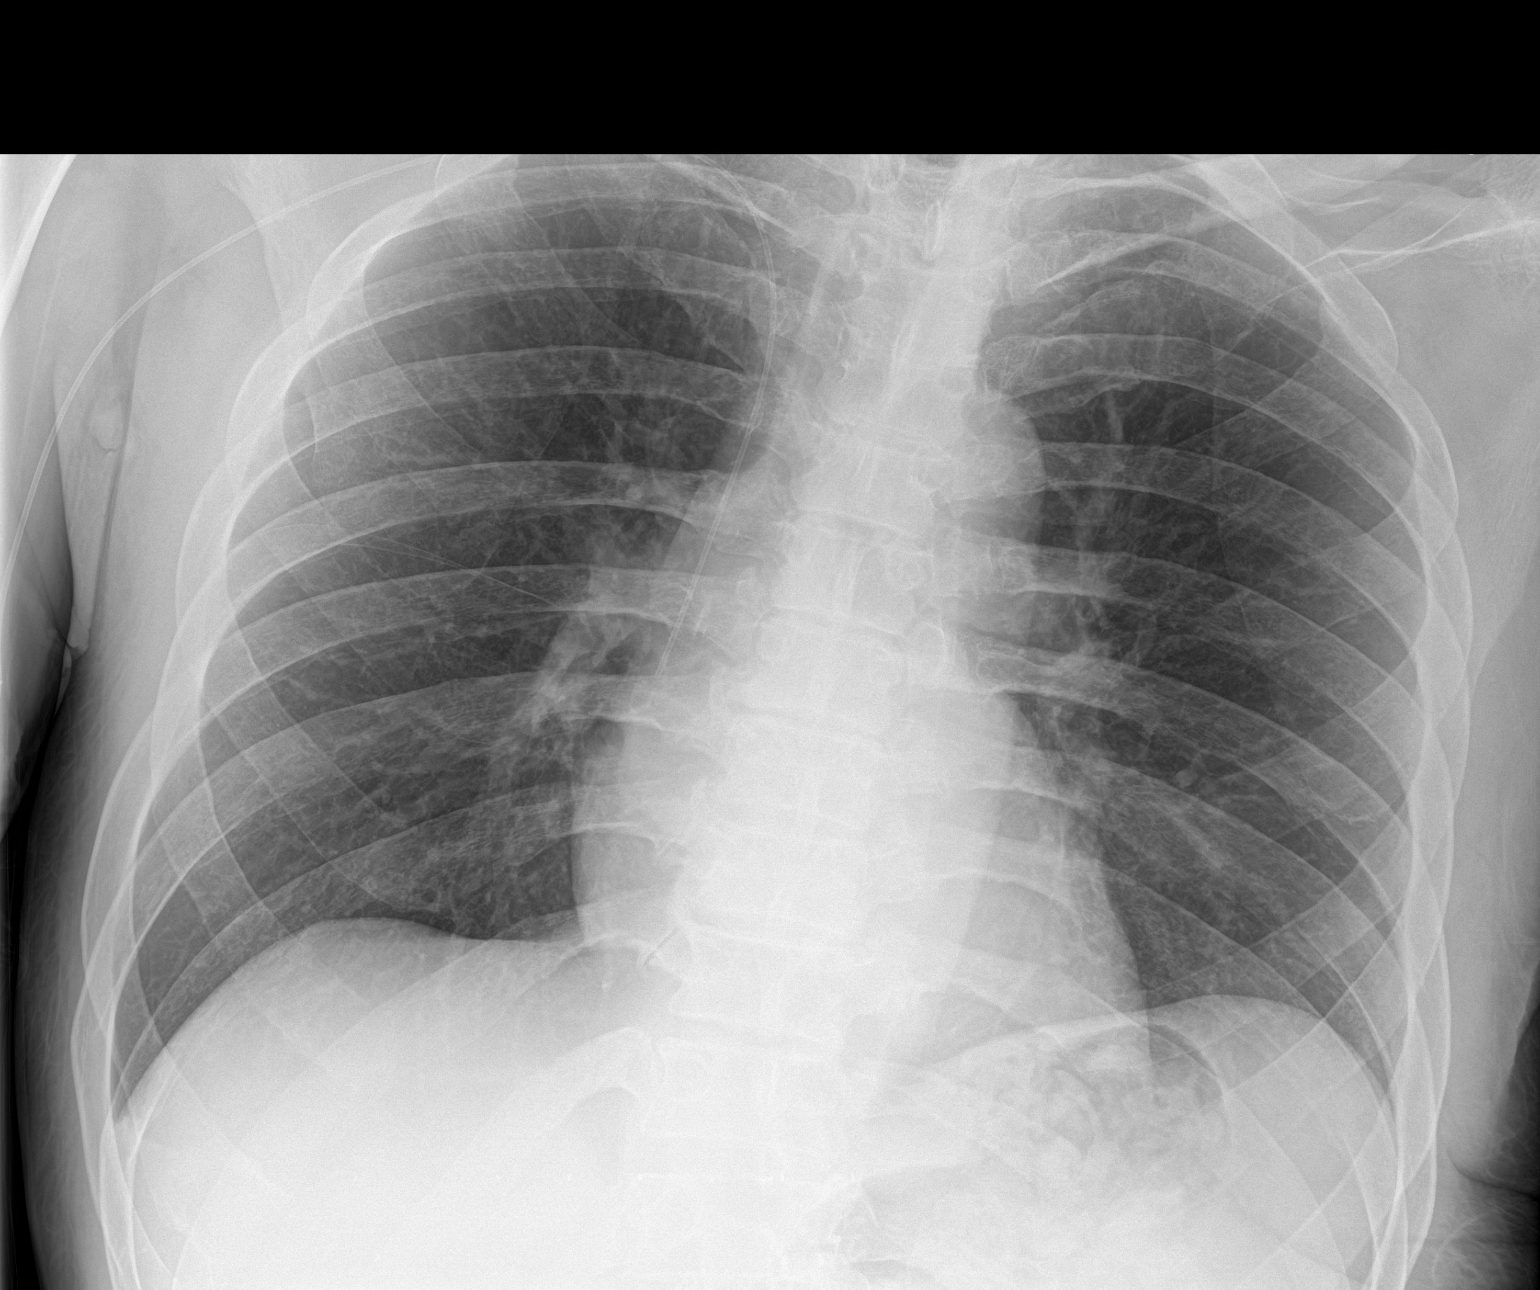

[1 of 1 positions shown; findings below may reference images not displayed]

FINDINGS: RIGHT arm PICC tip projects at or near the expected location of the
cavoatrial junction. Cardiac silhouette and mediastinal contours
normal in appearance for the AP portable technique. Pulmonary
parenchyma clear. Bronchovascular markings normal. Pulmonary
vascularity normal. No pneumothorax. No visible pleural effusions.
IMPRESSION: 1. RIGHT arm PICC tip projects at or near the expected location of
the cavoatrial junction.
2. No acute cardiopulmonary disease.

## 2020-01-05 ENCOUNTER — Other Ambulatory Visit: Payer: Self-pay

## 2020-01-05 ENCOUNTER — Ambulatory Visit (INDEPENDENT_AMBULATORY_CARE_PROVIDER_SITE_OTHER): Payer: Medicare Other | Admitting: Nurse Practitioner

## 2020-01-05 ENCOUNTER — Encounter (INDEPENDENT_AMBULATORY_CARE_PROVIDER_SITE_OTHER): Payer: Self-pay | Admitting: Nurse Practitioner

## 2020-01-05 ENCOUNTER — Ambulatory Visit (INDEPENDENT_AMBULATORY_CARE_PROVIDER_SITE_OTHER): Payer: Medicare Other

## 2020-01-05 VITALS — BP 153/78 | HR 111 | Ht 76.0 in | Wt 240.0 lb

## 2020-01-05 DIAGNOSIS — L97512 Non-pressure chronic ulcer of other part of right foot with fat layer exposed: Secondary | ICD-10-CM

## 2020-01-05 DIAGNOSIS — E782 Mixed hyperlipidemia: Secondary | ICD-10-CM

## 2020-01-05 DIAGNOSIS — I10 Essential (primary) hypertension: Secondary | ICD-10-CM

## 2020-01-05 DIAGNOSIS — I739 Peripheral vascular disease, unspecified: Secondary | ICD-10-CM

## 2020-01-05 NOTE — Progress Notes (Signed)
Subjective:    Patient ID: Austin [name], male    DOB: 22-Oct-1955, 64 y.o.   MRN: [id number] Chief Complaint  Patient presents with  . Follow-up    U/S Follow up    The patient is seen for evaluation of painful lower extremities and diminished pulses associated with ulceration of the foot on request of Dr. Alberteen Spindle.  The patient is an existing patient of our practice and has had a previous history of a left below-knee amputation.  Patient states that this particular ulcer started as a callus and it was found to be deeper than initially thought.  The wound was debrided by podiatry and has been slow to heal.  The patient notes the ulcer has been present for multiple weeks and has not been improving.  It is very painful and has had some drainage.  No specific history of trauma noted by the patient.  The patient denies fever or chills.  The patient was recently treated for cellulitis however he still continues to have pain in the foot.  The patient states that he has severe pain with elevation that is relieved by dangling his foot.   No history of back problems or DJD of the lumbar sacral spine.   The patient denies amaurosis fugax or recent TIA symptoms. There are no recent neurological changes noted. The patient denies history of DVT, PE or superficial thrombophlebitis. The patient denies recent episodes of angina or shortness of breath.   Today the patient underwent noninvasive studies.  He has an ABI of 0.55 on his right lower extremity with a TBI 0.25.  [name] previous studies on 09/16/2019 showed an ABI of 0.82 with a TBI of 0.49.  Today the patient had monophasic tibial artery waveforms with nearly flat toe waveforms.   Review of Systems  Cardiovascular: Positive for leg swelling.  Skin: Positive for wound (Ulcer on right foot, irritation on left stump on any).  Neurological: Positive for weakness.  All other systems reviewed and are negative.      Objective:   Physical  Exam Constitutional:      Appearance: Normal appearance.  HENT:     Head: Normocephalic.  Cardiovascular:     Pulses:          Dorsalis pedis pulses are 0 on the right side. Left dorsalis pedis pulse not accessible.       Posterior tibial pulses are 0 on the right side. Left posterior tibial pulse not accessible.  Musculoskeletal:     Right lower leg: 2+ Edema present.       Feet:     Left Lower Extremity: Left leg is amputated below knee.  Feet:     Right foot:     Skin integrity: Ulcer present.  Neurological:     Mental Status: He is alert.  Psychiatric:        Mood and Affect: Mood normal.        Behavior: Behavior normal.        Thought Content: Thought content normal.        Judgment: Judgment normal.     BP (!) 153/78   Pulse (!) 111   Ht 6\' 4"  (1.93 m)   Wt 240 lb (108.9 kg)   BMI 29.21 kg/m   Past Medical History:  Diagnosis Date  . Diabetes (HCC)   . DVT (deep venous thrombosis) (HCC)   . Hyperlipidemia   . Hypertension   . Peripheral vascular disease (HCC)  Social History   Socioeconomic History  . Marital status: Divorced    Spouse name: Not on file  . Number of children: Not on file  . Years of education: Not on file  . Highest education level: Not on file  Occupational History  . Not on file  Tobacco Use  . Smoking status: Former Smoker    Packs/day: 1.00    Years: 30.00    Pack years: 30.00    Types: Cigarettes    Quit date: 10/07/2018    Years since quitting: 1.2  . Smokeless tobacco: Never Used  Substance and Sexual Activity  . Alcohol use: No  . Drug use: No  . Sexual activity: Not Currently  Other Topics Concern  . Not on file  Social History Narrative  . Not on file   Social Determinants of Health   Financial Resource Strain:   . Difficulty of Paying Living Expenses:   Food Insecurity:   . Worried About Programme researcher, broadcasting/film/video in the Last Year:   . Barista in the Last Year:   Transportation Needs:   . Automotive engineer (Medical):   Marland Kitchen Lack of Transportation (Non-Medical):   Physical Activity:   . Days of Exercise per Week:   . Minutes of Exercise per Session:   Stress:   . Feeling of Stress :   Social Connections:   . Frequency of Communication with Friends and Family:   . Frequency of Social Gatherings with Friends and Family:   . Attends Religious Services:   . Active Member of Clubs or Organizations:   . Attends Banker Meetings:   Marland Kitchen Marital Status:   Intimate Partner Violence:   . Fear of Current or Ex-Partner:   . Emotionally Abused:   Marland Kitchen Physically Abused:   . Sexually Abused:     Past Surgical History:  Procedure Laterality Date  . AMPUTATION Left 10/28/2018   Procedure: AMPUTATION BELOW KNEE;  Surgeon: Annice Needy, MD;  Location: [facility];  Service: Vascular;  Laterality: Left;  . AMPUTATION TOE Left 02/10/2018   Procedure: AMPUTATION TOE;  Surgeon: Linus Galas, DPM;  Location: [facility];  Service: Podiatry;  Laterality: Left;  . APPLICATION OF WOUND VAC Left 10/16/2018   Procedure: APPLICATION OF WOUND VAC;  Surgeon: Gwyneth Revels, DPM;  Location: [facility];  Service: Podiatry;  Laterality: Left;  . groin surgery    . IRRIGATION AND DEBRIDEMENT FOOT Left 09/30/2018   Procedure: IRRIGATION AND DEBRIDEMENT FOOT;  Surgeon: Linus Galas, DPM;  Location: [facility];  Service: Podiatry;  Laterality: Left;  . LOWER EXTREMITY ANGIOGRAPHY Left 02/12/2018   Procedure: Lower Extremity Angiography;  Surgeon: Annice Needy, MD;  Location: [facility];  Service: Cardiovascular;  Laterality: Left;  . LOWER EXTREMITY ANGIOGRAPHY Left 10/01/2018   Procedure: Lower Extremity Angiography;  Surgeon: Annice Needy, MD;  Location: [facility];  Service: Cardiovascular;  Laterality: Left;  . LOWER EXTREMITY ANGIOGRAPHY Left 10/19/2018   Procedure: Lower Extremity Angiography;  Surgeon: Annice Needy, MD;  Location: [facility];  Service: Cardiovascular;  Laterality:  Left;  . LOWER EXTREMITY ANGIOGRAPHY Left 10/20/2018   Procedure: LOWER EXTREMITY ANGIOGRAPHY;  Surgeon: Annice Needy, MD;  Location: [facility];  Service: Cardiovascular;  Laterality: Left;  . LOWER EXTREMITY ANGIOGRAPHY Right 11/25/2018   Procedure: LOWER EXTREMITY ANGIOGRAPHY;  Surgeon: Annice Needy, MD;  Location: [facility];  Service: Cardiovascular;  Laterality: Right;  . LOWER EXTREMITY INTERVENTION  02/12/2018   Procedure: LOWER EXTREMITY INTERVENTION;  Surgeon: Algernon Huxley, MD;  Location: [facility];  Service: Cardiovascular;;  . NECK SURGERY    . TRANSMETATARSAL AMPUTATION Left 10/16/2018   Procedure: TRANSMETATARSAL AMPUTATION LEFT FOOT;  Surgeon: Samara Deist, DPM;  Location: [facility];  Service: Podiatry;  Laterality: Left;    Family History  Problem Relation Age of Onset  . Diabetes Mother   . Coronary artery disease Father   . Hypertension Sister   . Leukemia Brother     No Known Allergies     Assessment & Plan:   1. PAD (peripheral artery disease) (HCC)  Recommend:  The patient has evidence of severe atherosclerotic changes of both lower extremities associated with ulceration and tissue loss of the foot.  This represents a limb threatening ischemia and places the patient at the risk for limb loss.  Patient should undergo angiography of the right lower extremities with the hope for intervention for limb salvage.  The risks and benefits as well as the alternative therapies was discussed in detail with the patient.  All questions were answered.  Patient agrees to proceed with angiography.  The patient will follow up with me in the office after the procedure.    2. Essential hypertension Continue antihypertensive medications as already ordered, these medications have been reviewed and there are no changes at this time.   3. Mixed hyperlipidemia Continue statin as ordered and reviewed, no changes at this time   4. Ulcer of right foot  with fat layer exposed [facility]) Patient will continue with prescribed wound care.  Podiatry will continue to manage lower extremity ulcer.   Current Outpatient Medications on File Prior to Visit  Medication Sig Dispense Refill  . polyethylene glycol (MIRALAX) packet Take 17 g by mouth daily. 14 each 0  . amoxicillin-clavulanate (AUGMENTIN) 875-125 MG tablet SMARTSIG:1 Tablet(s) By Mouth Every 12 Hours    . apixaban (ELIQUIS) 5 MG TABS tablet Take 1 tablet (5 mg total) by mouth 2 (two) times daily. 60 tablet O  . aspirin EC 81 MG tablet Take 1 tablet (81 mg total) by mouth daily. (Patient not taking: Reported on 09/16/2019) 150 tablet 2  . atorvastatin (LIPITOR) 80 MG tablet Take 1 tablet (80 mg total) by mouth daily at 6 PM. 30 tablet 0  . clopidogrel (PLAVIX) 75 MG tablet Take 1 tablet (75 mg total) by mouth daily. (Patient not taking: Reported on 09/16/2019) 30 tablet 0  . docusate sodium (COLACE) 100 MG capsule Take 1 capsule (100 mg total) by mouth 2 (two) times daily. (Patient not taking: Reported on 09/16/2019) 20 capsule 0  . ENSURE MAX PROTEIN (ENSURE MAX PROTEIN) LIQD Take 330 mLs (11 oz total) by mouth 2 (two) times daily. (Patient not taking: Reported on 01/05/2020)    . gabapentin (NEURONTIN) 300 MG capsule Take 1 capsule (300 mg total) by mouth 2 (two) times daily. (Patient not taking: Reported on 01/05/2020) 30 capsule 0  . HUMALOG KWIKPEN 100 UNIT/ML KwikPen     . insulin aspart (NOVOLOG) 100 UNIT/ML injection Inject 10 Units into the skin 3 (three) times daily with meals. 10 mL 0  . insulin glargine (LANTUS) 100 UNIT/ML injection Inject 0.3 mLs (30 Units total) into the skin at bedtime. 10 mL 11  . mupirocin ointment (BACTROBAN) 2 % APPLY TOPICALLY TWO (2) TIMES A DAY FOR 7 DAYS. TO WOUND ON LEG, UNTIL HEALED    .  nicotine (NICODERM CQ - DOSED IN MG/24 HOURS) 14 mg/24hr patch Place 1 patch (14 mg total) onto the skin daily. (Patient not taking: Reported on 09/16/2019) 28 patch 0  .  oxyCODONE (OXY IR/ROXICODONE) 5 MG immediate release tablet Take 1 tablet (5 mg total) by mouth every 4 (four) hours as needed for moderate pain. (Patient not taking: Reported on 09/16/2019) 30 tablet 0  . pantoprazole (PROTONIX) 40 MG tablet Take 1 tablet (40 mg total) by mouth daily. (Patient not taking: Reported on 09/16/2019) 30 tablet 0   No current facility-administered medications on file prior to visit.    There are no Patient Instructions on file for this visit. No follow-ups on file.   Georgiana Spinner, NP

## 2020-01-06 ENCOUNTER — Other Ambulatory Visit
Admission: RE | Admit: 2020-01-06 | Discharge: 2020-01-06 | Disposition: A | Discharge status: Home / Self Care | Payer: Medicare Other | Source: Ambulatory Visit | Attending: Vascular Surgery | Admitting: Vascular Surgery

## 2020-01-06 DIAGNOSIS — Z20822 Contact with and (suspected) exposure to covid-19: Secondary | ICD-10-CM | POA: Diagnosis not present

## 2020-01-06 DIAGNOSIS — Z01812 Encounter for preprocedural laboratory examination: Secondary | ICD-10-CM | POA: Diagnosis present

## 2020-01-07 LAB — SARS CORONAVIRUS 2 (TAT 6-24 HRS): SARS Coronavirus 2: NEGATIVE

## 2020-01-10 ENCOUNTER — Encounter
Admission: RE | Disposition: A | Discharge status: Home / Self Care | Payer: Self-pay | Source: Ambulatory Visit | Attending: Vascular Surgery

## 2020-01-10 ENCOUNTER — Other Ambulatory Visit: Payer: Self-pay

## 2020-01-10 ENCOUNTER — Other Ambulatory Visit (INDEPENDENT_AMBULATORY_CARE_PROVIDER_SITE_OTHER): Payer: Self-pay | Admitting: Nurse Practitioner

## 2020-01-10 ENCOUNTER — Encounter: Payer: Self-pay | Admitting: Vascular Surgery

## 2020-01-10 ENCOUNTER — Ambulatory Visit
Admission: RE | Admit: 2020-01-10 | Discharge: 2020-01-10 | Disposition: A | Discharge status: Home / Self Care | Payer: Medicare Other | Source: Ambulatory Visit | Attending: Vascular Surgery | Admitting: Vascular Surgery

## 2020-01-10 DIAGNOSIS — L97512 Non-pressure chronic ulcer of other part of right foot with fat layer exposed: Secondary | ICD-10-CM | POA: Insufficient documentation

## 2020-01-10 DIAGNOSIS — I70235 Atherosclerosis of native arteries of right leg with ulceration of other part of foot: Secondary | ICD-10-CM | POA: Diagnosis present

## 2020-01-10 DIAGNOSIS — Z794 Long term (current) use of insulin: Secondary | ICD-10-CM | POA: Insufficient documentation

## 2020-01-10 DIAGNOSIS — Z86718 Personal history of other venous thrombosis and embolism: Secondary | ICD-10-CM | POA: Insufficient documentation

## 2020-01-10 DIAGNOSIS — Z7982 Long term (current) use of aspirin: Secondary | ICD-10-CM | POA: Diagnosis not present

## 2020-01-10 DIAGNOSIS — Z7901 Long term (current) use of anticoagulants: Secondary | ICD-10-CM | POA: Diagnosis not present

## 2020-01-10 DIAGNOSIS — L97909 Non-pressure chronic ulcer of unspecified part of unspecified lower leg with unspecified severity: Secondary | ICD-10-CM

## 2020-01-10 DIAGNOSIS — I70234 Atherosclerosis of native arteries of right leg with ulceration of heel and midfoot: Secondary | ICD-10-CM | POA: Diagnosis not present

## 2020-01-10 DIAGNOSIS — Z7902 Long term (current) use of antithrombotics/antiplatelets: Secondary | ICD-10-CM | POA: Diagnosis not present

## 2020-01-10 DIAGNOSIS — E11621 Type 2 diabetes mellitus with foot ulcer: Secondary | ICD-10-CM | POA: Diagnosis not present

## 2020-01-10 DIAGNOSIS — E782 Mixed hyperlipidemia: Secondary | ICD-10-CM | POA: Insufficient documentation

## 2020-01-10 DIAGNOSIS — E1151 Type 2 diabetes mellitus with diabetic peripheral angiopathy without gangrene: Secondary | ICD-10-CM | POA: Insufficient documentation

## 2020-01-10 DIAGNOSIS — I70299 Other atherosclerosis of native arteries of extremities, unspecified extremity: Secondary | ICD-10-CM

## 2020-01-10 DIAGNOSIS — Z79899 Other long term (current) drug therapy: Secondary | ICD-10-CM | POA: Diagnosis not present

## 2020-01-10 DIAGNOSIS — I10 Essential (primary) hypertension: Secondary | ICD-10-CM | POA: Diagnosis not present

## 2020-01-10 HISTORY — PX: LOWER EXTREMITY ANGIOGRAPHY: CATH118251

## 2020-01-10 LAB — CREATININE, SERUM
Creatinine, Ser: 0.84 mg/dL (ref 0.61–1.24)
GFR calc Af Amer: >60 mL/min (ref >60)
GFR calc non Af Amer: >60 mL/min (ref >60)

## 2020-01-10 LAB — BUN: BUN: 12 mg/dL (ref 8–23)

## 2020-01-10 LAB — GLUCOSE, CAPILLARY
Glucose-Capillary: 231 mg/dL — ABNORMAL HIGH (ref 70–99)
Glucose-Capillary: 233 mg/dL — ABNORMAL HIGH (ref 70–99)

## 2020-01-10 SURGERY — LOWER EXTREMITY ANGIOGRAPHY
Anesthesia: Moderate Sedation | Site: Leg Lower | Laterality: Right

## 2020-01-10 MED ORDER — MIDAZOLAM HCL 2 MG/2ML IJ SOLN
INTRAMUSCULAR | Status: DC | PRN
  Administered 2020-01-10: 1 mg via INTRAVENOUS
  Administered 2020-01-10: 2 mg via INTRAVENOUS
  Administered 2020-01-10: 0.5 mg via INTRAVENOUS

## 2020-01-10 MED ORDER — ONDANSETRON HCL 4 MG/2ML IJ SOLN: 4.0000 mg | Freq: Four times a day (QID) | INTRAMUSCULAR | Status: DC | PRN

## 2020-01-10 MED ORDER — SODIUM CHLORIDE 0.9 % IV SOLN: 250.0000 mL | INTRAVENOUS | Status: DC | PRN

## 2020-01-10 MED ORDER — HEPARIN SODIUM (PORCINE) 1000 UNIT/ML IJ SOLN
INTRAMUSCULAR | Status: DC | PRN
  Administered 2020-01-10: 5000 [IU] via INTRAVENOUS

## 2020-01-10 MED ORDER — SODIUM CHLORIDE 0.9% FLUSH: 3.0000 mL | INTRAVENOUS | Status: DC | PRN

## 2020-01-10 MED ORDER — METHYLPREDNISOLONE SODIUM SUCC 125 MG IJ SOLR: 125.0000 mg | Freq: Once | INTRAMUSCULAR | Status: DC | PRN

## 2020-01-10 MED ORDER — SODIUM CHLORIDE 0.9 % IV SOLN: INTRAVENOUS | Status: DC

## 2020-01-10 MED ORDER — HEPARIN SODIUM (PORCINE) 1000 UNIT/ML IJ SOLN
INTRAMUSCULAR | Status: AC
  Filled 2020-01-10: qty 1

## 2020-01-10 MED ORDER — DIPHENHYDRAMINE HCL 50 MG/ML IJ SOLN: 50.0000 mg | Freq: Once | INTRAMUSCULAR | Status: DC | PRN

## 2020-01-10 MED ORDER — ASPIRIN EC 81 MG PO TBEC: 81.0000 mg | DELAYED_RELEASE_TABLET | Freq: Every day | ORAL | Status: DC

## 2020-01-10 MED ORDER — LABETALOL HCL 5 MG/ML IV SOLN: 10.0000 mg | INTRAVENOUS | Status: DC | PRN

## 2020-01-10 MED ORDER — ACETAMINOPHEN 325 MG PO TABS: 650.0000 mg | ORAL_TABLET | ORAL | Status: DC | PRN

## 2020-01-10 MED ORDER — SODIUM CHLORIDE 0.9% FLUSH: 3.0000 mL | Freq: Two times a day (BID) | INTRAVENOUS | Status: DC

## 2020-01-10 MED ORDER — MIDAZOLAM HCL 5 MG/5ML IJ SOLN
INTRAMUSCULAR | Status: AC
  Filled 2020-01-10: qty 5

## 2020-01-10 MED ORDER — CEFAZOLIN SODIUM-DEXTROSE 2-4 GM/100ML-% IV SOLN
2.0000 g | Freq: Once | INTRAVENOUS | Status: AC
  Administered 2020-01-10: 2 g via INTRAVENOUS

## 2020-01-10 MED ORDER — ASPIRIN EC 81 MG PO TBEC: 81.0000 mg | DELAYED_RELEASE_TABLET | Freq: Every day | ORAL | 2 refills | Status: DC

## 2020-01-10 MED ORDER — FENTANYL CITRATE (PF) 100 MCG/2ML IJ SOLN
INTRAMUSCULAR | Status: DC | PRN
  Administered 2020-01-10 (×2): 50 ug via INTRAVENOUS
  Administered 2020-01-10: 25 ug via INTRAVENOUS

## 2020-01-10 MED ORDER — FAMOTIDINE 20 MG PO TABS: 40.0000 mg | ORAL_TABLET | Freq: Once | ORAL | Status: DC | PRN

## 2020-01-10 MED ORDER — MIDAZOLAM HCL 2 MG/ML PO SYRP: 8.0000 mg | ORAL_SOLUTION | Freq: Once | ORAL | Status: DC | PRN

## 2020-01-10 MED ORDER — FENTANYL CITRATE (PF) 100 MCG/2ML IJ SOLN
INTRAMUSCULAR | Status: AC
  Filled 2020-01-10: qty 2

## 2020-01-10 MED ORDER — HYDRALAZINE HCL 20 MG/ML IJ SOLN: 5.0000 mg | INTRAMUSCULAR | Status: DC | PRN

## 2020-01-10 MED ORDER — HYDROMORPHONE HCL 1 MG/ML IJ SOLN: 1.0000 mg | Freq: Once | INTRAMUSCULAR | Status: DC | PRN

## 2020-01-10 SURGICAL SUPPLY — 20 items
BALLN LUTONIX 018 5X80X130 (BALLOONS) ×3
BALLN ULTRVRSE 2.5X220X150 (BALLOONS) ×3
BALLN ULTRVRSE 3X100X150 (BALLOONS) ×3
BALLOON LUTONIX 018 5X80X130 (BALLOONS) ×1 IMPLANT
BALLOON ULTRVRSE 2.5X220X150 (BALLOONS) ×1 IMPLANT
BALLOON ULTRVRSE 3X100X150 (BALLOONS) ×1 IMPLANT
CATH PIG 70CM (CATHETERS) ×3 IMPLANT
CATH VERT 100CM (CATHETERS) ×3 IMPLANT
CATH VERT 5FR 125CM (CATHETERS) ×3 IMPLANT
DEVICE PRESTO INFLATION (MISCELLANEOUS) ×3 IMPLANT
DEVICE STARCLOSE SE CLOSURE (Vascular Products) ×3 IMPLANT
GLIDEWIRE ADV .035X260CM (WIRE) ×3 IMPLANT
GUIDEWIRE AMPLATZ SHORT (WIRE) ×3 IMPLANT
PACK ANGIOGRAPHY (CUSTOM PROCEDURE TRAY) ×3 IMPLANT
SHEATH ANL2 6FRX45 HC (SHEATH) ×3 IMPLANT
SHEATH BRITE TIP 4FRX11 (SHEATH) ×3 IMPLANT
SHEATH BRITE TIP 5FRX11 (SHEATH) ×6 IMPLANT
STENT VIABAHN 6X7.5X120 (Permanent Stent) ×3 IMPLANT
WIRE G V18X300CM (WIRE) ×3 IMPLANT
WIRE J 3MM .035X145CM (WIRE) ×3 IMPLANT

## 2020-01-10 NOTE — Op Note (Signed)
[facility]  Percutaneous Study/Intervention Procedural Note   Date of Surgery: 01/10/2020  Surgeon(s):Moria Brophy    Assistants:none  Pre-operative Diagnosis: PAD with ulceration right lower extremity  Post-operative diagnosis:  Same  Procedure(s) Performed:             1.  Ultrasound guidance for vascular access left femoral artery             2.  Catheter placement into right common femoral artery from left femoral approach             3.  Aortogram and selective right lower extremity angiogram occluding selective imaging the peroneal and the posterior tibial arteries             4.  Percutaneous transluminal angioplasty of right peroneal artery and tibioperoneal trunk with 2.5 mm diameter by 22 cm length angioplasty balloon             5.   Percutaneous transluminal angioplasty of the right posterior tibial artery and tibioperoneal trunk with 3 mm diameter by 10 cm length angioplasty balloon  6.  Stent placement to the proximal right SFA with 6 mm diameter by 7.5 cm length Viabahn stent postdilated with a 5 mm diameter [name] drug-coated angioplasty balloon             7.  StarClose closure device left femoral artery  EBL: 10 cc  Contrast: 75 cc  Fluoro Time: 5.2 minutes  Moderate Conscious Sedation Time: approximately 35 minutes using 3.5 mg of Versed and 125 mcg of Fentanyl              Indications:  Patient is a 64 y.o.male with a long history of PAD with nonhealing ulceration on the right foot.  The patient is already lost his left leg. The patient has noninvasive study showing a drop in his right ABI in the 0.5 range with a reduced digital pressure. The patient is brought in for angiography for further evaluation and potential treatment.  Due to the limb threatening nature of the situation, angiogram was performed for attempted limb salvage. The patient is aware that if the procedure fails, amputation would be expected.  The patient also understands that  even with successful revascularization, amputation may still be required due to the severity of the situation.  Risks and benefits are discussed and informed consent is obtained.   Procedure:  The patient was identified and appropriate procedural time out was performed.  The patient was then placed supine on the table and prepped and draped in the usual sterile fashion. Moderate conscious sedation was administered during a face to face encounter with the patient throughout the procedure with my supervision of the RN administering medicines and monitoring the patient's vital signs, pulse oximetry, telemetry and mental status throughout from the start of the procedure until the patient was taken to the recovery room. Ultrasound was used to evaluate the left common femoral artery.  It was patent .  A digital ultrasound image was acquired.  A Seldinger needle was used to access the left common femoral artery under direct ultrasound guidance and a permanent image was performed.  A 0.035 J wire was advanced without resistance and a 5Fr sheath was placed.  Pigtail catheter was placed into the aorta and an AP aortogram was performed. This demonstrated normal renal arteries, normal aorta although somewhat irregular and small.  The right iliac system was patent including 2 previously placed stents.  The left common iliac stent  was patent.  There was a mild to moderate stenosis in the left external iliac artery approaching 50 to 60%, but with no current issues on the left leg that has already had a left below-knee amputation I elected not to treat that today. I then crossed the aortic bifurcation and advanced to the right femoral head. Selective right lower extremity angiogram was then performed. This demonstrated moderate stenosis at the origin of the SFA and then a napkin ringlike stenosis about 5 cm beyond this in the 90 to 95% range.  The SFA then normalized with mild disease throughout the remainder of the SFA and the  popliteal artery that did not appear flow-limiting.  The tibial opacification was not great initially, but on initial imaging there appeared to be a continuous anterior tibial artery with only mild disease.  The tibioperoneal trunk and the proximal portions of the peroneal and the posterior tibial arteries appear to be occluded but did reconstitute after the proximal occlusions. It was felt that it was in the patient's best interest to proceed with intervention after these images to avoid a second procedure and a larger amount of contrast and fluoroscopy based off of the findings from the initial angiogram. The patient was systemically heparinized and a 6 [place] Ansell sheath was then placed over the [name] wire. I then used a Kumpe catheter and the advantage wire to navigate through the SFA lesion and get down into the below-knee popliteal artery.  We then exchanged for a V 18 wire and used a 120 cm Kumpe catheter to advance across the tibioperoneal trunk occlusion and down into the peroneal artery.  The peroneal artery had selective imaging performed which showed it to be widely patent from the proximal to mid peroneal artery distally after the proximal occlusion.  I then replaced the wire and performed angioplasty with a 2.5 mm diameter by 22 cm length angioplasty balloon inflated to 10 atm for 1 minute.  Completion imaging showed less than 20% residual stenosis in the peroneal artery with borderline 50% residual stenosis in the tibioperoneal trunk.  I then turned my attention to the posterior tibial artery.  The Kumpe catheter and V 18 wire were used to easily cross the proximal occlusion.  Selective imaging was then performed of the posterior tibial artery showing it to be widely patent after the short segment proximal occlusion.  The V 18 wire was then parked down at the ankle and the catheter was removed.  A 3 mm diameter by 10 cm length angioplasty balloon was then used to treat the proximal  portion of the posterior tibial artery in the tibioperoneal trunk and inflated to 12 atm for 1 minute.  Completion imaging showed less than 20% residual stenosis in the tibioperoneal trunk, posterior tibial artery in the area treated, with some spasm below the area treated but remaining brisk flow distally.  I then turned my attention of the proximal SFA disease.  A 6 mm diameter by 7.5 cm length Viabahn stent was then deployed from just below the origin of the SFA down across the napkin ringlike stenosis.  This was then postdilated with a 5 mm diameter by 8 cm likely tonics drug-coated angioplasty balloon inflated to 10 atm for 1 minute.  Completion imaging showed no significant residual stenosis in the area treated. I elected to terminate the procedure. The sheath was removed and StarClose closure device was deployed in the left femoral artery with excellent hemostatic result. The patient was taken to the recovery  room in stable condition having tolerated the procedure well.  Findings:               Aortogram:  Normal renal arteries, normal aorta although somewhat irregular and small.  The right iliac system was patent including 2 previously placed stents.  The left common iliac stent was patent.  There was a mild to moderate stenosis in the left external iliac artery approaching 50 to 60%, but with no current issues on the left leg that has already had a left below-knee amputation I elected not to treat that today.             Right lower Extremity:  This demonstrated moderate stenosis at the origin of the SFA and then a napkin ringlike stenosis about 5 cm beyond this in the 90 to 95% range.  The SFA then normalized with mild disease throughout the remainder of the SFA and the popliteal artery that did not appear flow-limiting.  The tibial opacification was not great initially, but on initial imaging there appeared to be a continuous anterior tibial artery with only mild disease.  The tibioperoneal trunk and  the proximal portions of the peroneal and the posterior tibial arteries appear to be occluded but did reconstitute after the proximal occlusions.   Disposition: Patient was taken to the recovery room in stable condition having tolerated the procedure well.  Complications: None  Leotis Pain 01/10/2020 10:14 AM   This note was created with Dragon Medical transcription system. Any errors in dictation are purely unintentional.

## 2020-01-10 NOTE — H&P (Signed)
[facility] History & Physical Update  The patient was interviewed and re-examined.  [name] previous History and Physical has been reviewed and is unchanged.  There is no change in the plan of care. We plan to proceed with the scheduled procedure.  Festus Barren, MD  01/10/2020, 9:04 AM

## 2020-02-01 ENCOUNTER — Other Ambulatory Visit (INDEPENDENT_AMBULATORY_CARE_PROVIDER_SITE_OTHER): Payer: Self-pay | Admitting: Vascular Surgery

## 2020-02-01 DIAGNOSIS — I70239 Atherosclerosis of native arteries of right leg with ulceration of unspecified site: Secondary | ICD-10-CM

## 2020-02-01 DIAGNOSIS — Z95820 Peripheral vascular angioplasty status with implants and grafts: Secondary | ICD-10-CM

## 2020-02-08 ENCOUNTER — Ambulatory Visit (INDEPENDENT_AMBULATORY_CARE_PROVIDER_SITE_OTHER): Payer: Medicare Other | Admitting: Nurse Practitioner

## 2020-02-08 ENCOUNTER — Ambulatory Visit (INDEPENDENT_AMBULATORY_CARE_PROVIDER_SITE_OTHER): Payer: Medicare Other

## 2020-02-08 ENCOUNTER — Encounter (INDEPENDENT_AMBULATORY_CARE_PROVIDER_SITE_OTHER): Payer: Self-pay | Admitting: Nurse Practitioner

## 2020-02-08 ENCOUNTER — Other Ambulatory Visit: Payer: Self-pay

## 2020-02-08 VITALS — BP 114/72 | HR 90 | Resp 16

## 2020-02-08 DIAGNOSIS — E782 Mixed hyperlipidemia: Secondary | ICD-10-CM

## 2020-02-08 DIAGNOSIS — Z95820 Peripheral vascular angioplasty status with implants and grafts: Secondary | ICD-10-CM | POA: Diagnosis not present

## 2020-02-08 DIAGNOSIS — I70213 Atherosclerosis of native arteries of extremities with intermittent claudication, bilateral legs: Secondary | ICD-10-CM | POA: Diagnosis not present

## 2020-02-08 DIAGNOSIS — Z89512 Acquired absence of left leg below knee: Secondary | ICD-10-CM | POA: Diagnosis not present

## 2020-02-08 DIAGNOSIS — I10 Essential (primary) hypertension: Secondary | ICD-10-CM | POA: Diagnosis not present

## 2020-02-08 DIAGNOSIS — L97512 Non-pressure chronic ulcer of other part of right foot with fat layer exposed: Secondary | ICD-10-CM | POA: Diagnosis not present

## 2020-02-08 DIAGNOSIS — I70239 Atherosclerosis of native arteries of right leg with ulceration of unspecified site: Secondary | ICD-10-CM | POA: Diagnosis not present

## 2020-02-08 NOTE — Progress Notes (Signed)
Subjective:    Patient ID: Austin [name], male    DOB: 09-17-1956, 64 y.o.   MRN: [id number] Chief Complaint  Patient presents with  . Follow-up    [facility] 4wk post rle angio    The patient returns to the office for followup and review status post angiogram with intervention. The patient notes improvement in the lower extremity symptoms. No interval shortening of the patient's claudication distance or rest pain symptoms.  The previous wound on the patient's foot appears to be looking better with good beefy red wound bed as well as bleeding present.  The patient also has an abrasion near the area where his stump is rubbing.  He is currently working with his prosthetic maker to resize his stump.  There have been no significant changes to the patient's overall health care.  The patient denies amaurosis fugax or recent TIA symptoms. There are no recent neurological changes noted. The patient denies history of DVT, PE or superficial thrombophlebitis. The patient denies recent episodes of angina or shortness of breath.   ABI's Rt=0.90 and Lt=n/a  (previous ABI's Rt=0.55 and Lt=n/a) Duplex US of the right tibial arteries reveals biphasic waveforms with good toe waveforms.  The patient has a left below-knee amputation.   Review of Systems  Cardiovascular: Positive for leg swelling.  Musculoskeletal: Positive for gait problem.  Skin: Positive for wound.  All other systems reviewed and are negative.      Objective:   Physical Exam Vitals reviewed. Exam conducted with a chaperone present.  Cardiovascular:     Pulses: Decreased pulses.     Heart sounds: Normal heart sounds.  Pulmonary:     Effort: Pulmonary effort is normal.     Breath sounds: Normal breath sounds.  Musculoskeletal:       Legs:       Feet:     Left Lower Extremity: Left leg is amputated below knee.  Feet:     Right foot:     Skin integrity: Ulcer present.  Neurological:     Mental Status: He is alert and  oriented to person, place, and time.  Psychiatric:        Mood and Affect: Mood normal.        Behavior: Behavior normal.        Thought Content: Thought content normal.        Judgment: Judgment normal.     BP 114/72 (BP Location: Right Arm)   Pulse 90   Resp 16   Past Medical History:  Diagnosis Date  . Diabetes (HCC)   . DVT (deep venous thrombosis) (HCC)   . Hyperlipidemia   . Hypertension   . Peripheral vascular disease (HCC)     Social History   Socioeconomic History  . Marital status: Divorced    Spouse name: Not on file  . Number of children: Not on file  . Years of education: Not on file  . Highest education level: Not on file  Occupational History  . Not on file  Tobacco Use  . Smoking status: Former Smoker    Packs/day: 1.00    Years: 30.00    Pack years: 30.00    Types: Cigarettes    Quit date: 10/07/2018    Years since quitting: 1.3  . Smokeless tobacco: Never Used  Substance and Sexual Activity  . Alcohol use: No  . Drug use: No  . Sexual activity: Not Currently  Other Topics Concern  . Not on file  Social  History Narrative  . Not on file   Social Determinants of Health   Financial Resource Strain:   . Difficulty of Paying Living Expenses:   Food Insecurity:   . Worried About Programme researcher, broadcasting/film/video in the Last Year:   . Barista in the Last Year:   Transportation Needs:   . Freight forwarder (Medical):   Marland Kitchen Lack of Transportation (Non-Medical):   Physical Activity:   . Days of Exercise per Week:   . Minutes of Exercise per Session:   Stress:   . Feeling of Stress :   Social Connections:   . Frequency of Communication with Friends and Family:   . Frequency of Social Gatherings with Friends and Family:   . Attends Religious Services:   . Active Member of Clubs or Organizations:   . Attends Banker Meetings:   Marland Kitchen Marital Status:   Intimate Partner Violence:   . Fear of Current or Ex-Partner:   . Emotionally Abused:    Marland Kitchen Physically Abused:   . Sexually Abused:     Past Surgical History:  Procedure Laterality Date  . AMPUTATION Left 10/28/2018   Procedure: AMPUTATION BELOW KNEE;  Surgeon: Annice Needy, MD;  Location: [facility];  Service: Vascular;  Laterality: Left;  . AMPUTATION TOE Left 02/10/2018   Procedure: AMPUTATION TOE;  Surgeon: Linus Galas, DPM;  Location: [facility];  Service: Podiatry;  Laterality: Left;  . APPLICATION OF WOUND VAC Left 10/16/2018   Procedure: APPLICATION OF WOUND VAC;  Surgeon: Gwyneth Revels, DPM;  Location: [facility];  Service: Podiatry;  Laterality: Left;  . groin surgery    . IRRIGATION AND DEBRIDEMENT FOOT Left 09/30/2018   Procedure: IRRIGATION AND DEBRIDEMENT FOOT;  Surgeon: Linus Galas, DPM;  Location: [facility];  Service: Podiatry;  Laterality: Left;  . LOWER EXTREMITY ANGIOGRAPHY Left 02/12/2018   Procedure: Lower Extremity Angiography;  Surgeon: Annice Needy, MD;  Location: [facility];  Service: Cardiovascular;  Laterality: Left;  . LOWER EXTREMITY ANGIOGRAPHY Left 10/01/2018   Procedure: Lower Extremity Angiography;  Surgeon: Annice Needy, MD;  Location: [facility];  Service: Cardiovascular;  Laterality: Left;  . LOWER EXTREMITY ANGIOGRAPHY Left 10/19/2018   Procedure: Lower Extremity Angiography;  Surgeon: Annice Needy, MD;  Location: [facility];  Service: Cardiovascular;  Laterality: Left;  . LOWER EXTREMITY ANGIOGRAPHY Left 10/20/2018   Procedure: LOWER EXTREMITY ANGIOGRAPHY;  Surgeon: Annice Needy, MD;  Location: [facility];  Service: Cardiovascular;  Laterality: Left;  . LOWER EXTREMITY ANGIOGRAPHY Right 11/25/2018   Procedure: LOWER EXTREMITY ANGIOGRAPHY;  Surgeon: Annice Needy, MD;  Location: [facility];  Service: Cardiovascular;  Laterality: Right;  . LOWER EXTREMITY ANGIOGRAPHY Right 01/10/2020   Procedure: LOWER EXTREMITY ANGIOGRAPHY;  Surgeon: Annice Needy, MD;  Location: [facility];  Service:  Cardiovascular;  Laterality: Right;  . LOWER EXTREMITY INTERVENTION  02/12/2018   Procedure: LOWER EXTREMITY INTERVENTION;  Surgeon: Annice Needy, MD;  Location: [facility];  Service: Cardiovascular;;  . NECK SURGERY    . TRANSMETATARSAL AMPUTATION Left 10/16/2018   Procedure: TRANSMETATARSAL AMPUTATION LEFT FOOT;  Surgeon: Gwyneth Revels, DPM;  Location: [facility];  Service: Podiatry;  Laterality: Left;    Family History  Problem Relation Age of Onset  . Diabetes Mother   . Coronary artery disease Father   . Hypertension Sister   . Leukemia Brother     No  Known Allergies     Assessment & Plan:   1. Atherosclerosis of native artery of both lower extremities with intermittent claudication (HCC) Recommend:  The patient is status post successful angiogram with intervention.  The patient reports that the claudication symptoms and leg pain is essentially gone.   The patient denies lifestyle limiting changes at this point in time.  No further invasive studies, angiography or surgery at this time The patient should continue walking and begin a more formal exercise program.  The patient should continue antiplatelet therapy and aggressive treatment of the lipid abnormalities   The patient should continue wearing graduated compression socks 10-15 mmHg strength to control the mild edema.  Patient should undergo noninvasive studies as ordered. The patient will follow up with me after the studies.    2. Essential hypertension Continue antihypertensive medications as already ordered, these medications have been reviewed and there are no changes at this time.   3. Ulcer of right foot with fat layer exposed ([place]) Lower extremity ulceration is improving.  Patient will continue to follow with podiatry for treatment.  4. Status post below-knee amputation of left lower extremity [facility]) Patient doing well with his prosthetic however recently has been rubbing and creating a wound.   Patient is advised to place some sort of padding such as a DuoDERM over the area in order to insulate it.  5. Mixed hyperlipidemia Continue statin as ordered and reviewed, no changes at this time    Current Outpatient Medications on File Prior to Visit  Medication Sig Dispense Refill  . apixaban (ELIQUIS) 5 MG TABS tablet Take 1 tablet (5 mg total) by mouth 2 (two) times daily. 60 tablet O  . aspirin EC 81 MG tablet Take 1 tablet (81 mg total) by mouth daily. 150 tablet 2  . atorvastatin (LIPITOR) 80 MG tablet Take 1 tablet (80 mg total) by mouth daily at 6 PM. 30 tablet 0  . HUMALOG KWIKPEN 100 UNIT/ML KwikPen     . insulin aspart (NOVOLOG) 100 UNIT/ML injection Inject 10 Units into the skin 3 (three) times daily with meals. 10 mL 0  . insulin glargine (LANTUS) 100 UNIT/ML injection Inject 0.3 mLs (30 Units total) into the skin at bedtime. 10 mL 11  . mupirocin ointment (BACTROBAN) 2 % APPLY TOPICALLY TWO (2) TIMES A DAY FOR 7 DAYS. TO WOUND ON LEG, UNTIL HEALED    . polyethylene glycol (MIRALAX) packet Take 17 g by mouth daily. 14 each 0  . amoxicillin-clavulanate (AUGMENTIN) 875-125 MG tablet SMARTSIG:1 Tablet(s) By Mouth Every 12 Hours     No current facility-administered medications on file prior to visit.    There are no Patient Instructions on file for this visit. No follow-ups on file.   Kris Hartmann, NP

## 2020-03-21 ENCOUNTER — Encounter (INDEPENDENT_AMBULATORY_CARE_PROVIDER_SITE_OTHER): Payer: Medicare Other

## 2020-03-21 ENCOUNTER — Ambulatory Visit (INDEPENDENT_AMBULATORY_CARE_PROVIDER_SITE_OTHER): Payer: Medicare Other | Admitting: Vascular Surgery

## 2020-05-09 ENCOUNTER — Ambulatory Visit (INDEPENDENT_AMBULATORY_CARE_PROVIDER_SITE_OTHER): Payer: Medicare Other | Admitting: Vascular Surgery

## 2020-05-09 ENCOUNTER — Other Ambulatory Visit (INDEPENDENT_AMBULATORY_CARE_PROVIDER_SITE_OTHER): Payer: Self-pay | Admitting: Nurse Practitioner

## 2020-05-09 ENCOUNTER — Encounter (INDEPENDENT_AMBULATORY_CARE_PROVIDER_SITE_OTHER): Payer: Medicare Other

## 2020-05-09 DIAGNOSIS — I739 Peripheral vascular disease, unspecified: Secondary | ICD-10-CM

## 2020-05-19 ENCOUNTER — Other Ambulatory Visit: Payer: Self-pay

## 2020-05-19 ENCOUNTER — Ambulatory Visit (INDEPENDENT_AMBULATORY_CARE_PROVIDER_SITE_OTHER): Payer: Medicare Other | Admitting: Vascular Surgery

## 2020-05-19 ENCOUNTER — Encounter (INDEPENDENT_AMBULATORY_CARE_PROVIDER_SITE_OTHER): Payer: Self-pay | Admitting: Vascular Surgery

## 2020-05-19 ENCOUNTER — Ambulatory Visit (INDEPENDENT_AMBULATORY_CARE_PROVIDER_SITE_OTHER): Payer: Medicare Other

## 2020-05-19 VITALS — BP 135/85 | HR 105 | Resp 16

## 2020-05-19 DIAGNOSIS — I739 Peripheral vascular disease, unspecified: Secondary | ICD-10-CM | POA: Diagnosis not present

## 2020-05-19 DIAGNOSIS — Z794 Long term (current) use of insulin: Secondary | ICD-10-CM

## 2020-05-19 DIAGNOSIS — E782 Mixed hyperlipidemia: Secondary | ICD-10-CM | POA: Diagnosis not present

## 2020-05-19 DIAGNOSIS — Z89512 Acquired absence of left leg below knee: Secondary | ICD-10-CM | POA: Diagnosis not present

## 2020-05-19 DIAGNOSIS — I10 Essential (primary) hypertension: Secondary | ICD-10-CM | POA: Diagnosis not present

## 2020-05-19 DIAGNOSIS — I7025 Atherosclerosis of native arteries of other extremities with ulceration: Secondary | ICD-10-CM

## 2020-05-19 DIAGNOSIS — E1151 Type 2 diabetes mellitus with diabetic peripheral angiopathy without gangrene: Secondary | ICD-10-CM | POA: Diagnosis not present

## 2020-05-19 NOTE — Progress Notes (Signed)
MRN : [id number]  Austin [name] is a 64 y.o. ([birth year]) male who presents with chief complaint of  Chief Complaint  Patient presents with  . Follow-up    46month abi  .  History of Present Illness: Patient returns today in follow up of PAD and right foot ulceration.  He is already lost his left leg.  His ulcer is slowly improving and the podiatrist is doing a good job of following this.  He has undergone extensive right lower extremity revascularization.  He has mild swelling in the right leg currently.  His ABI today 0.85 on the right with a normal digital pressure and decent waveforms.  Current Outpatient Medications  Medication Sig Dispense Refill  . apixaban (ELIQUIS) 5 MG TABS tablet Take 1 tablet (5 mg total) by mouth 2 (two) times daily. 60 tablet O  . aspirin EC 81 MG tablet Take 1 tablet (81 mg total) by mouth daily. 150 tablet 2  . atorvastatin (LIPITOR) 80 MG tablet Take 1 tablet (80 mg total) by mouth daily at 6 PM. 30 tablet 0  . HUMALOG KWIKPEN 100 UNIT/ML KwikPen     . insulin aspart (NOVOLOG) 100 UNIT/ML injection Inject 10 Units into the skin 3 (three) times daily with meals. 10 mL 0  . insulin glargine (LANTUS) 100 UNIT/ML injection Inject 0.3 mLs (30 Units total) into the skin at bedtime. 10 mL 11  . mupirocin ointment (BACTROBAN) 2 % APPLY TOPICALLY TWO (2) TIMES A DAY FOR 7 DAYS. TO WOUND ON LEG, UNTIL HEALED    . polyethylene glycol (MIRALAX) packet Take 17 g by mouth daily. 14 each 0  . amoxicillin-clavulanate (AUGMENTIN) 875-125 MG tablet SMARTSIG:1 Tablet(s) By Mouth Every 12 Hours (Patient not taking: Reported on 05/19/2020)     No current facility-administered medications for this visit.    Past Medical History:  Diagnosis Date  . Diabetes (HCC)   . DVT (deep venous thrombosis) (HCC)   . Hyperlipidemia   . Hypertension   . Peripheral vascular disease [facility])     Past Surgical History:  Procedure Laterality Date  . AMPUTATION Left 10/28/2018    Procedure: AMPUTATION BELOW KNEE;  Surgeon: Annice Needy, MD;  Location: [facility];  Service: Vascular;  Laterality: Left;  . AMPUTATION TOE Left 02/10/2018   Procedure: AMPUTATION TOE;  Surgeon: Linus Galas, DPM;  Location: [facility];  Service: Podiatry;  Laterality: Left;  . APPLICATION OF WOUND VAC Left 10/16/2018   Procedure: APPLICATION OF WOUND VAC;  Surgeon: Gwyneth Revels, DPM;  Location: [facility];  Service: Podiatry;  Laterality: Left;  . groin surgery    . IRRIGATION AND DEBRIDEMENT FOOT Left 09/30/2018   Procedure: IRRIGATION AND DEBRIDEMENT FOOT;  Surgeon: Linus Galas, DPM;  Location: [facility];  Service: Podiatry;  Laterality: Left;  . LOWER EXTREMITY ANGIOGRAPHY Left 02/12/2018   Procedure: Lower Extremity Angiography;  Surgeon: Annice Needy, MD;  Location: [facility];  Service: Cardiovascular;  Laterality: Left;  . LOWER EXTREMITY ANGIOGRAPHY Left 10/01/2018   Procedure: Lower Extremity Angiography;  Surgeon: Annice Needy, MD;  Location: [facility];  Service: Cardiovascular;  Laterality: Left;  . LOWER EXTREMITY ANGIOGRAPHY Left 10/19/2018   Procedure: Lower Extremity Angiography;  Surgeon: Annice Needy, MD;  Location: [facility];  Service: Cardiovascular;  Laterality: Left;  . LOWER EXTREMITY ANGIOGRAPHY Left 10/20/2018   Procedure: LOWER EXTREMITY ANGIOGRAPHY;  Surgeon: Annice Needy, MD;  Location: [facility];  Service: Cardiovascular;  Laterality: Left;  . LOWER EXTREMITY ANGIOGRAPHY Right 11/25/2018   Procedure: LOWER EXTREMITY ANGIOGRAPHY;  Surgeon: Annice Needyew, Mckinsey Keagle S, MD;  Location: [facility];  Service: Cardiovascular;  Laterality: Right;  . LOWER EXTREMITY ANGIOGRAPHY Right 01/10/2020   Procedure: LOWER EXTREMITY ANGIOGRAPHY;  Surgeon: Annice Needyew, Alysa Duca S, MD;  Location: [facility];  Service: Cardiovascular;  Laterality: Right;  . LOWER EXTREMITY INTERVENTION  02/12/2018   Procedure: LOWER EXTREMITY INTERVENTION;  Surgeon: Annice Needyew, Kytzia Gienger S, MD;   Location: [facility];  Service: Cardiovascular;;  . NECK SURGERY    . TRANSMETATARSAL AMPUTATION Left 10/16/2018   Procedure: TRANSMETATARSAL AMPUTATION LEFT FOOT;  Surgeon: Gwyneth RevelsFowler, Justin, DPM;  Location: [facility];  Service: Podiatry;  Laterality: Left;     Social History   Tobacco Use  . Smoking status: Former Smoker    Packs/day: 1.00    Years: 30.00    Pack years: 30.00    Types: Cigarettes    Quit date: 10/07/2018    Years since quitting: 1.6  . Smokeless tobacco: Never Used  Vaping Use  . Vaping Use: Never used  Substance Use Topics  . Alcohol use: No  . Drug use: No      Family History  Problem Relation Age of Onset  . Diabetes Mother   . Coronary artery disease Father   . Hypertension Sister   . Leukemia Brother      No Known Allergies   REVIEW OF SYSTEMS (Negative unless checked)  Constitutional: [] Weight loss  [] Fever  [] Chills Cardiac: [] Chest pain   [] Chest pressure   [] Palpitations   [] Shortness of breath when laying flat   [] Shortness of breath at rest   [] Shortness of breath with exertion. Vascular:  [] Pain in legs with walking   [] Pain in legs at rest   [] Pain in legs when laying flat   [] Claudication   [] Pain in feet when walking  [] Pain in feet at rest  [] Pain in feet when laying flat   [] History of DVT   [] Phlebitis   [] Swelling in legs   [] Varicose veins   [x] Non-healing ulcers Pulmonary:   [] Uses home oxygen   [] Productive cough   [] Hemoptysis   [] Wheeze  [] COPD   [] Asthma Neurologic:  [] Dizziness  [] Blackouts   [] Seizures   [] History of stroke   [] History of TIA  [] Aphasia   [] Temporary blindness   [] Dysphagia   [] Weakness or numbness in arms   [] Weakness or numbness in legs Musculoskeletal:  [x] Arthritis   [] Joint swelling   [] Joint pain   [] Low back pain Hematologic:  [] Easy bruising  [] Easy bleeding   [] Hypercoagulable state   [] Anemic   Gastrointestinal:  [] Blood in stool   [] Vomiting blood  [] Gastroesophageal reflux/heartburn    [] Abdominal pain Genitourinary:  [] Chronic kidney disease   [] Difficult urination  [] Frequent urination  [] Burning with urination   [] Hematuria Skin:  [] Rashes   [x] Ulcers   [x] Wounds Psychological:  [] History of anxiety   []  History of major depression.  Physical Examination  BP 135/85 (BP Location: Right Arm)   Pulse (!) 105   Resp 16  Gen:  WD/WN, NAD Head: [place]/AT, No temporalis wasting. Ear/Nose/Throat: Hearing grossly intact, nares w/o erythema or drainage Eyes: Conjunctiva clear. Sclera non-icteric Neck: Supple.  Trachea midline Pulmonary:  Good air movement, no use of accessory muscles.  Cardiac: RRR, no JVD Vascular:  Vessel Right Left  Radial Palpable Palpable  PT  1+ palpable  not palpable  DP  1+ palpable  not palpable    Musculoskeletal: M/S 5/5 throughout.  No deformity or atrophy.  1 cm circular ulceration under the right fifth metatarsal head.  1+ right lower extremity edema. Neurologic: Sensation grossly intact in extremities.  Symmetrical.  Speech is fluent.  Psychiatric: Judgment intact, Mood & affect appropriate for pt's clinical situation. Dermatologic: Right foot ulceration as above with no erythema or drainage       Labs No results found for this or any previous visit (from the past 2160 hour(s)).  Radiology VAS [place] ABI WITH/WO TBI  Result Date: 05/19/2020 LOWER EXTREMITY DOPPLER STUDY Indications: Peripheral artery disease.  Vascular Interventions: H/O mulitple left leg revascularizations with left BKA                         on 10/28/18;                         11/25/18: Right SFA, popliteal & TP trunk PTAs;                          01/10/2020:Aortogram and Selective Right Lower Extremity                         Angiogram occluding selective Imaging of the Rt Peroneal                         Arterty and Posterior Tibial Artery. PTA of the Right                         Peroneal Artery and Tibioperoneal Trunk. PTA of the                          Right Posterior Tibial Artery and Tibioperoneal Trunk.                         Stent placement to the Right Proximal SFA. Comparison Study: 02/08/2020 Performing Technologist: Salvadore Farber RVT  Examination Guidelines: A complete evaluation includes at minimum, Doppler waveform signals and systolic blood pressure reading at the level of bilateral brachial, anterior tibial, and posterior tibial arteries, when vessel segments are accessible. Bilateral testing is considered an integral part of a complete examination. Photoelectric Plethysmograph (PPG) waveforms and toe systolic pressure readings are included as required and additional duplex testing as needed. Limited examinations for reoccurring indications may be performed as noted.  ABI Findings: +---------+------------------+-----+--------+--------+ Right    Rt Pressure (mmHg)IndexWaveformComment  +---------+------------------+-----+--------+--------+ Brachial 158                                     +---------+------------------+-----+--------+--------+ ATA      134               0.85 biphasic         +---------+------------------+-----+--------+--------+ PTA                             absent           +---------+------------------+-----+--------+--------+ Dayna Barker  0.82 Abnormal         +---------+------------------+-----+--------+--------+ +-------+-----------+-----------+------------+------------+ ABI/TBIToday's ABIToday's TBIPrevious ABIPrevious TBI +-------+-----------+-----------+------------+------------+ Right  .85        .82        .90         .90          +-------+-----------+-----------+------------+------------+ Right ABIs and TBIs appear essentially unchanged compared to prior study on 02/08/2020.  Summary: Right: Resting right ankle-brachial index indicates mild right lower extremity arterial disease. The right toe-brachial index is normal. PTA not detected. Left: Left BKA.  *See  table(s) above for measurements and observations.  Electronically signed by Festus Barren MD on 05/19/2020 at 12:18:24 PM.   Final     Assessment/Plan  Hyperlipidemia lipid control important in reducing the progression of atherosclerotic disease. Continue statin therapy   Diabetes (HCC) blood glucose control important in reducing the progression of atherosclerotic disease. Also, involved in wound healing. On appropriate medications.   Status post below-knee amputation (HCC) Well-healed  Atherosclerosis of native arteries of the extremities with ulceration (HCC)  His ABI today 0.85 on the right with a normal digital pressure and decent waveforms.  At this point, his perfusion has been improved and is adequate for wound healing.  Offload the pressure on the sore.  Continue local wound care.  We will keep a close interval follow-up of 3 months as long as he has a wound present.    Festus Barren, MD  05/19/2020 1:13 PM    This note was created with Dragon medical transcription system.  Any errors from dictation are purely unintentional

## 2020-05-19 NOTE — Assessment & Plan Note (Signed)
Well-healed

## 2020-05-19 NOTE — Assessment & Plan Note (Signed)
blood glucose control important in reducing the progression of atherosclerotic disease. Also, involved in wound healing. On appropriate medications.  

## 2020-05-19 NOTE — Assessment & Plan Note (Signed)
His ABI today 0.85 on the right with a normal digital pressure and decent waveforms.  At this point, his perfusion has been improved and is adequate for wound healing.  Offload the pressure on the sore.  Continue local wound care.  We will keep a close interval follow-up of 3 months as long as he has a wound present.

## 2020-05-19 NOTE — Assessment & Plan Note (Signed)
lipid control important in reducing the progression of atherosclerotic disease. Continue statin therapy  

## 2020-08-16 ENCOUNTER — Emergency Department (HOSPITAL_COMMUNITY)
Admission: EM | Admit: 2020-08-16 | Discharge: 2020-08-17 | Disposition: A | Discharge status: Home / Self Care | Payer: Medicare Other | Attending: Emergency Medicine | Admitting: Emergency Medicine

## 2020-08-16 ENCOUNTER — Other Ambulatory Visit: Payer: Self-pay

## 2020-08-16 DIAGNOSIS — N481 Balanitis: Secondary | ICD-10-CM | POA: Insufficient documentation

## 2020-08-16 DIAGNOSIS — B9689 Other specified bacterial agents as the cause of diseases classified elsewhere: Secondary | ICD-10-CM | POA: Diagnosis not present

## 2020-08-16 DIAGNOSIS — R300 Dysuria: Secondary | ICD-10-CM | POA: Diagnosis present

## 2020-08-16 LAB — URINALYSIS, ROUTINE W REFLEX MICROSCOPIC
Bilirubin Urine: NEGATIVE
Glucose, UA: >=500 mg/dL — AB
Ketones, ur: NEGATIVE mg/dL
Nitrite: NEGATIVE
Protein, ur: 30 mg/dL — AB
Specific Gravity, Urine: 1.026 (ref 1.005–1.030)
pH: 5 (ref 5.0–8.0)

## 2020-08-16 NOTE — ED Triage Notes (Signed)
Pt reports his penis has been swollen and he has burning with and frequency of urination x several months. Sts his doctor told him it was yeast and gave him some cream but pt sts it is not getting better.

## 2020-08-16 NOTE — ED Provider Notes (Signed)
[facility] EMERGENCY DEPARTMENT Provider Note   CSN: [id number] Arrival date & time: 08/16/20  1837     History No chief complaint on file.   Austin [name] is a 64 y.o. male.  Patient with PMH of DM, HTN, HL presents to the ED with a chief complaint of dysuria. He states that he has been having pain around the tip and shaft of his penis and also has pain in to the scrotum.  He states that this pain started today.  He denies any fevers.  He has been treated with "a cream for yeast," but patient states that it never completely clears up the infection.  He denies any other associated symptoms.  The history is provided by the patient. No language interpreter was used.       Past Medical History:  Diagnosis Date  . Diabetes (HCC)   . DVT (deep venous thrombosis) (HCC)   . Hyperlipidemia   . Hypertension   . Peripheral vascular disease [facility])     Patient Active Problem List   Diagnosis Date Noted  . Diabetes mellitus type 2, uncomplicated (HCC) 09/16/2019  . Atherosclerosis of native arteries of the extremities with ulceration (HCC) 01/01/2019  . Status post below-knee amputation (HCC) 11/30/2018  . Diabetic foot infection (HCC) 10/14/2018  . Malnutrition of moderate degree 10/02/2018  . Foot ulcer (HCC) 09/29/2018  . Gangrene of left foot (HCC) 02/09/2018  . Atherosclerosis of native arteries of extremity with intermittent claudication (HCC) 05/25/2017  . Diabetes (HCC) 05/25/2017  . Essential hypertension 05/25/2017  . Hyperlipidemia 05/25/2017    Past Surgical History:  Procedure Laterality Date  . AMPUTATION Left 10/28/2018   Procedure: AMPUTATION BELOW KNEE;  Surgeon: Annice Needy, MD;  Location: [facility] ORS;  Service: Vascular;  Laterality: Left;  . AMPUTATION TOE Left 02/10/2018   Procedure: AMPUTATION TOE;  Surgeon: Linus Galas, DPM;  Location: [facility] ORS;  Service: Podiatry;  Laterality: Left;  . APPLICATION OF WOUND VAC Left 10/16/2018   Procedure:  APPLICATION OF WOUND VAC;  Surgeon: Gwyneth Revels, DPM;  Location: [facility] ORS;  Service: Podiatry;  Laterality: Left;  . groin surgery    . IRRIGATION AND DEBRIDEMENT FOOT Left 09/30/2018   Procedure: IRRIGATION AND DEBRIDEMENT FOOT;  Surgeon: Linus Galas, DPM;  Location: [facility] ORS;  Service: Podiatry;  Laterality: Left;  . LOWER EXTREMITY ANGIOGRAPHY Left 02/12/2018   Procedure: Lower Extremity Angiography;  Surgeon: Annice Needy, MD;  Location: [facility] INVASIVE CV LAB;  Service: Cardiovascular;  Laterality: Left;  . LOWER EXTREMITY ANGIOGRAPHY Left 10/01/2018   Procedure: Lower Extremity Angiography;  Surgeon: Annice Needy, MD;  Location: [facility] INVASIVE CV LAB;  Service: Cardiovascular;  Laterality: Left;  . LOWER EXTREMITY ANGIOGRAPHY Left 10/19/2018   Procedure: Lower Extremity Angiography;  Surgeon: Annice Needy, MD;  Location: [facility] INVASIVE CV LAB;  Service: Cardiovascular;  Laterality: Left;  . LOWER EXTREMITY ANGIOGRAPHY Left 10/20/2018   Procedure: LOWER EXTREMITY ANGIOGRAPHY;  Surgeon: Annice Needy, MD;  Location: [facility] INVASIVE CV LAB;  Service: Cardiovascular;  Laterality: Left;  . LOWER EXTREMITY ANGIOGRAPHY Right 11/25/2018   Procedure: LOWER EXTREMITY ANGIOGRAPHY;  Surgeon: Annice Needy, MD;  Location: [facility] INVASIVE CV LAB;  Service: Cardiovascular;  Laterality: Right;  . LOWER EXTREMITY ANGIOGRAPHY Right 01/10/2020   Procedure: LOWER EXTREMITY ANGIOGRAPHY;  Surgeon: Annice Needy, MD;  Location: [facility] INVASIVE CV LAB;  Service: Cardiovascular;  Laterality: Right;  . LOWER EXTREMITY INTERVENTION  02/12/2018   Procedure: LOWER EXTREMITY  INTERVENTION;  Surgeon: Annice Needy, MD;  Location: [facility] INVASIVE CV LAB;  Service: Cardiovascular;;  . NECK SURGERY    . TRANSMETATARSAL AMPUTATION Left 10/16/2018   Procedure: TRANSMETATARSAL AMPUTATION LEFT FOOT;  Surgeon: Gwyneth Revels, DPM;  Location: [facility] ORS;  Service: Podiatry;  Laterality: Left;       Family History  Problem Relation Age of Onset  .  Diabetes Mother   . Coronary artery disease Father   . Hypertension Sister   . Leukemia Brother     Social History   Tobacco Use  . Smoking status: Former Smoker    Packs/day: 1.00    Years: 30.00    Pack years: 30.00    Types: Cigarettes    Quit date: 10/07/2018    Years since quitting: 1.8  . Smokeless tobacco: Never Used  Vaping Use  . Vaping Use: Never used  Substance Use Topics  . Alcohol use: No  . Drug use: No    Home Medications Prior to Admission medications   Medication Sig Start Date End Date Taking? Authorizing Provider  amoxicillin-clavulanate (AUGMENTIN) 875-125 MG tablet SMARTSIG:1 Tablet(s) By Mouth Every 12 Hours Patient not taking: Reported on 05/19/2020 12/20/19   [provider]  apixaban (ELIQUIS) 5 MG TABS tablet Take 1 tablet (5 mg total) by mouth 2 (two) times daily. 02/12/18   Ramonita Lab, MD  aspirin EC 81 MG tablet Take 1 tablet (81 mg total) by mouth daily. 01/10/20   Annice Needy, MD  atorvastatin (LIPITOR) 80 MG tablet Take 1 tablet (80 mg total) by mouth daily at 6 PM. 02/12/18   Gouru, Deanna Artis, MD  HUMALOG KWIKPEN 100 UNIT/ML KwikPen  12/08/19   [provider]  insulin aspart (NOVOLOG) 100 UNIT/ML injection Inject 10 Units into the skin 3 (three) times daily with meals. 10/30/18   Adrian Saran, MD  insulin glargine (LANTUS) 100 UNIT/ML injection Inject 0.3 mLs (30 Units total) into the skin at bedtime. 10/30/18   Adrian Saran, MD  mupirocin ointment (BACTROBAN) 2 % APPLY TOPICALLY TWO (2) TIMES A DAY FOR 7 DAYS. TO WOUND ON LEG, UNTIL HEALED 10/14/19   [provider]  polyethylene glycol (MIRALAX) packet Take 17 g by mouth daily. 10/04/18   Milagros Loll, MD    Allergies    Patient has no known allergies.  Review of Systems   Review of Systems  All other systems reviewed and are negative.   Physical Exam Updated Vital Signs BP 130/88   Pulse 86   Temp 98 F (36.7 C) (Oral)   Resp 17   SpO2 97%   Physical Exam Vitals  and nursing note reviewed.  Constitutional:      General: He is not in acute distress.    Appearance: He is well-developed. He is not ill-appearing.  HENT:     Head: Normocephalic and atraumatic.  Eyes:     Conjunctiva/sclera: Conjunctivae normal.  Cardiovascular:     Rate and Rhythm: Normal rate.  Pulmonary:     Effort: Pulmonary effort is normal. No respiratory distress.  Abdominal:     General: There is no distension.  Genitourinary:    Comments: Uncircumcised Erythema of foreskin White discharge No evidence of phimosis/paraphimosis  Musculoskeletal:     Cervical back: Neck supple.     Comments: Moves all extremities  Skin:    General: Skin is warm and dry.  Neurological:     Mental Status: He is alert and oriented to person, place, and time.  Psychiatric:        Mood and Affect: Mood normal.        Behavior: Behavior normal.     ED Results / Procedures / Treatments   Labs (all labs ordered are listed, but only abnormal results are displayed) Labs Reviewed  URINALYSIS, ROUTINE W REFLEX MICROSCOPIC - Abnormal; Notable for the following components:      Result Value   Glucose, UA >=500 (*)    Hgb urine dipstick SMALL (*)    Protein, ur 30 (*)    Leukocytes,Ua SMALL (*)    Bacteria, UA RARE (*)    All other components within normal limits  CBC WITH DIFFERENTIAL/PLATELET  BASIC METABOLIC PANEL    EKG None  Radiology No results found.  Procedures Procedures (including critical care time)  Medications Ordered in ED Medications - No data to display  ED Course  I have reviewed the triage vital signs and the nursing notes.  Pertinent labs & imaging results that were available during my care of the patient were reviewed by me and considered in my medical decision making (see chart for details).    MDM Rules/Calculators/A&P                          Patient here with dysuria and pain and swelling of the tip of his penis.  Symptoms seem consistent with  balanitis.  He has been diagnosed with this in the past and has been prescribed ointment for yeast.  He does not have any evidence of phimosis or paraphimosis.  He is able to urinate normally.  His vitals are reassuring.  As he has failed treatment with cream, will given oral diflucan.  I do have some concern for concomitant cellulitis of the foreskin, but I see no evidence of abscess, scrotal cellulitis, or perineal infection or fournier's gangrene.    Return precautions discussed. Final Clinical Impression(s) / ED Diagnoses Final diagnoses:  Balanitis    Rx / DC Orders ED Discharge Orders         Ordered    cephALEXin (KEFLEX) 500 MG capsule  2 times daily        08/17/20 0052    fluconazole (DIFLUCAN) 150 MG tablet        08/17/20 0052           Roxy Horseman, PA-C 08/17/20 0053    Dione Booze, MD 08/17/20 [phone]

## 2020-08-17 LAB — BASIC METABOLIC PANEL
Anion gap: 9 (ref 5–15)
BUN: 11 mg/dL (ref 8–23)
CO2: 26 mmol/L (ref 22–32)
Calcium: 9.7 mg/dL (ref 8.9–10.3)
Chloride: 105 mmol/L (ref 98–111)
Creatinine, Ser: 1.05 mg/dL (ref 0.61–1.24)
GFR, Estimated: >60 mL/min (ref >60)
Glucose, Bld: 146 mg/dL — ABNORMAL HIGH (ref 70–99)
Potassium: 4 mmol/L (ref 3.5–5.1)
Sodium: 140 mmol/L (ref 135–145)

## 2020-08-17 LAB — CBC WITH DIFFERENTIAL/PLATELET
Abs Immature Granulocytes: 0.07 10*3/uL (ref 0.00–0.07)
Basophils Absolute: 0 10*3/uL (ref 0.0–0.1)
Basophils Relative: 0 %
Eosinophils Absolute: 0.1 10*3/uL (ref 0.0–0.5)
Eosinophils Relative: 1 %
HCT: 45.5 % (ref 39.0–52.0)
Hemoglobin: 14.9 g/dL (ref 13.0–17.0)
Immature Granulocytes: 1 %
Lymphocytes Relative: 29 %
Lymphs Abs: 3.2 10*3/uL (ref 0.7–4.0)
MCH: 28 pg (ref 26.0–34.0)
MCHC: 32.7 g/dL (ref 30.0–36.0)
MCV: 85.4 fL (ref 80.0–100.0)
Monocytes Absolute: 0.8 10*3/uL (ref 0.1–1.0)
Monocytes Relative: 7 %
Neutro Abs: 6.8 10*3/uL (ref 1.7–7.7)
Neutrophils Relative %: 62 %
Platelets: 246 10*3/uL (ref 150–400)
RBC: 5.33 MIL/uL (ref 4.22–5.81)
RDW: 15.3 % (ref 11.5–15.5)
WBC: 10.9 10*3/uL — ABNORMAL HIGH (ref 4.0–10.5)
nRBC: 0 % (ref 0.0–0.2)

## 2020-08-17 MED ORDER — CEPHALEXIN 500 MG PO CAPS: 500.0000 mg | ORAL_CAPSULE | Freq: Two times a day (BID) | ORAL | 0 refills | Status: DC

## 2020-08-17 MED ORDER — CEPHALEXIN 250 MG PO CAPS
500.0000 mg | ORAL_CAPSULE | Freq: Once | ORAL | Status: AC
  Administered 2020-08-17: 500 mg via ORAL
  Filled 2020-08-17: qty 2

## 2020-08-17 MED ORDER — FLUCONAZOLE 150 MG PO TABS: ORAL_TABLET | ORAL | 0 refills | Status: DC

## 2020-08-17 MED ORDER — FLUCONAZOLE 150 MG PO TABS
150.0000 mg | ORAL_TABLET | Freq: Once | ORAL | Status: AC
  Administered 2020-08-17: 150 mg via ORAL
  Filled 2020-08-17: qty 1

## 2020-08-17 NOTE — ED Notes (Signed)
DC instructions reviewed with patient and family and both verbalized understanding. Patient used wheelchair for departure. Vss and nad.

## 2020-08-22 ENCOUNTER — Ambulatory Visit (INDEPENDENT_AMBULATORY_CARE_PROVIDER_SITE_OTHER): Payer: Medicare Other | Admitting: Vascular Surgery

## 2020-08-22 ENCOUNTER — Ambulatory Visit (INDEPENDENT_AMBULATORY_CARE_PROVIDER_SITE_OTHER): Payer: Medicare Other

## 2020-08-22 ENCOUNTER — Other Ambulatory Visit: Payer: Self-pay

## 2020-08-22 VITALS — BP 128/83 | HR 91 | Ht 76.0 in | Wt 240.0 lb

## 2020-08-22 DIAGNOSIS — E1151 Type 2 diabetes mellitus with diabetic peripheral angiopathy without gangrene: Secondary | ICD-10-CM | POA: Diagnosis not present

## 2020-08-22 DIAGNOSIS — I7025 Atherosclerosis of native arteries of other extremities with ulceration: Secondary | ICD-10-CM

## 2020-08-22 DIAGNOSIS — E782 Mixed hyperlipidemia: Secondary | ICD-10-CM

## 2020-08-22 DIAGNOSIS — I10 Essential (primary) hypertension: Secondary | ICD-10-CM | POA: Diagnosis not present

## 2020-08-22 DIAGNOSIS — Z794 Long term (current) use of insulin: Secondary | ICD-10-CM

## 2020-08-22 NOTE — Progress Notes (Signed)
MRN : [id number]  Austin [name] is a 64 y.o. (04-11-1956) male who presents with chief complaint of  Chief Complaint  Patient presents with  . Follow-up    33mo U/s follow up  .  History of Present Illness: Patient returns today in follow up of his PAD.  He still has a persistent ulceration on the right lateral foot.  He sees his podiatrist next week.  This is somewhat painful but not severely so.  The patient denies any fevers or chills or signs of systemic infection.  He does have persistent swelling in that right leg. ABI today is 0.95 although the digital waveform remains reduced.  Current Outpatient Medications  Medication Sig Dispense Refill  . ACCU-CHEK GUIDE test strip     . apixaban (ELIQUIS) 5 MG TABS tablet Take 1 tablet (5 mg total) by mouth 2 (two) times daily. 60 tablet O  . aspirin EC 81 MG tablet Take 1 tablet (81 mg total) by mouth daily. 150 tablet 2  . atorvastatin (LIPITOR) 80 MG tablet Take 1 tablet (80 mg total) by mouth daily at 6 PM. 30 tablet 0  . cephALEXin (KEFLEX) 500 MG capsule Take 1 capsule (500 mg total) by mouth 2 (two) times daily. 14 capsule 0  . fluconazole (DIFLUCAN) 150 MG tablet Take 1 tablet on 11/18. 1 tablet 0  . HUMALOG KWIKPEN 100 UNIT/ML KwikPen     . insulin aspart (NOVOLOG) 100 UNIT/ML injection Inject 10 Units into the skin 3 (three) times daily with meals. 10 mL 0  . insulin glargine (LANTUS) 100 UNIT/ML injection Inject 0.3 mLs (30 Units total) into the skin at bedtime. 10 mL 11  . mupirocin ointment (BACTROBAN) 2 % APPLY TOPICALLY TWO (2) TIMES A DAY FOR 7 DAYS. TO WOUND ON LEG, UNTIL HEALED    . polyethylene glycol (MIRALAX) packet Take 17 g by mouth daily. 14 each 0   No current facility-administered medications for this visit.    Past Medical History:  Diagnosis Date  . Diabetes (HCC)   . DVT (deep venous thrombosis) (HCC)   . Hyperlipidemia   . Hypertension   . Peripheral vascular disease [facility])     Past Surgical  History:  Procedure Laterality Date  . AMPUTATION Left 10/28/2018   Procedure: AMPUTATION BELOW KNEE;  Surgeon: Annice Needy, MD;  Location: [facility] ORS;  Service: Vascular;  Laterality: Left;  . AMPUTATION TOE Left 02/10/2018   Procedure: AMPUTATION TOE;  Surgeon: Linus Galas, DPM;  Location: [facility] ORS;  Service: Podiatry;  Laterality: Left;  . APPLICATION OF WOUND VAC Left 10/16/2018   Procedure: APPLICATION OF WOUND VAC;  Surgeon: Gwyneth Revels, DPM;  Location: [facility] ORS;  Service: Podiatry;  Laterality: Left;  . groin surgery    . IRRIGATION AND DEBRIDEMENT FOOT Left 09/30/2018   Procedure: IRRIGATION AND DEBRIDEMENT FOOT;  Surgeon: Linus Galas, DPM;  Location: [facility] ORS;  Service: Podiatry;  Laterality: Left;  . LOWER EXTREMITY ANGIOGRAPHY Left 02/12/2018   Procedure: Lower Extremity Angiography;  Surgeon: Annice Needy, MD;  Location: [facility] INVASIVE CV LAB;  Service: Cardiovascular;  Laterality: Left;  . LOWER EXTREMITY ANGIOGRAPHY Left 10/01/2018   Procedure: Lower Extremity Angiography;  Surgeon: Annice Needy, MD;  Location: [facility] INVASIVE CV LAB;  Service: Cardiovascular;  Laterality: Left;  . LOWER EXTREMITY ANGIOGRAPHY Left 10/19/2018   Procedure: Lower Extremity Angiography;  Surgeon: Annice Needy, MD;  Location: [facility] IN[facility] LAB;  Service: Cardiovascular;  Laterality: Left;  .  LOWER EXTREMITY ANGIOGRAPHY Left 10/20/2018   Procedure: LOWER EXTREMITY ANGIOGRAPHY;  Surgeon: Annice Needy, MD;  Location: [facility] INV[facility] CV LAB;  Service: Cardiovascular;  Laterality: Left;  . LOWER EXTREMITY ANGIOGRAPHY Right 11/25/2018   Procedure: LOWER EXTREMITY ANGIOGRAPHY;  Surgeon: Annice Needy, MD;  Location: [facility] IN[facility] CV LAB;  Service: Cardiovascular;  Laterality: Right;  . LOWER EXTREMITY ANGIOGRAPHY Right 01/10/2020   Procedure: LOWER EXTREMITY ANGIOGRAPHY;  Surgeon: Annice Needy, MD;  Location: [facility] LAB;  Service: Cardiovascular;  Laterality: Right;  . LOWER EXTREMITY INTERVENTION  02/12/2018    Procedure: LOWER EXTREMITY INTERVENTION;  Surgeon: Annice Needy, MD;  Location: [facility] IN[facility] LAB;  Service: Cardiovascular;;  . NECK SURGERY    . TRANSMETATARSAL AMPUTATION Left 10/16/2018   Procedure: TRANSMETATARSAL AMPUTATION LEFT FOOT;  Surgeon: Gwyneth Revels, DPM;  Location: [facility] ORS;  Service: Podiatry;  Laterality: Left;     Social History   Tobacco Use  . Smoking status: Former Smoker    Packs/day: 1.00    Years: 30.00    Pack years: 30.00    Types: Cigarettes    Quit date: 10/07/2018    Years since quitting: 1.8  . Smokeless tobacco: Never Used  Vaping Use  . Vaping Use: Never used  Substance Use Topics  . Alcohol use: No  . Drug use: No      Family History  Problem Relation Age of Onset  . Diabetes Mother   . Coronary artery disease Father   . Hypertension Sister   . Leukemia Brother     No Known Allergies   REVIEW OF SYSTEMS (Negative unless checked)  Constitutional: [] ?Weight loss  [] ?Fever  [] ?Chills Cardiac: [] ?Chest pain   [] ?Chest pressure   [] ?Palpitations   [] ?Shortness of breath when laying flat   [] ?Shortness of breath at rest   [] ?Shortness of breath with exertion. Vascular:  [] ?Pain in legs with walking   [] ?Pain in legs at rest   [] ?Pain in legs when laying flat   [] ?Claudication   [] ?Pain in feet when walking  [] ?Pain in feet at rest  [] ?Pain in feet when laying flat   [] ?History of DVT   [] ?Phlebitis   [] ?Swelling in legs   [] ?Varicose veins   [x] ?Non-healing ulcers Pulmonary:   [] ?Uses home oxygen   [] ?Productive cough   [] ?Hemoptysis   [] ?Wheeze  [] ?COPD   [] ?Asthma Neurologic:  [] ?Dizziness  [] ?Blackouts   [] ?Seizures   [] ?History of stroke   [] ?History of TIA  [] ?Aphasia   [] ?Temporary blindness   [] ?Dysphagia   [] ?Weakness or numbness in arms   [] ?Weakness or numbness in legs Musculoskeletal:  [x] ?Arthritis   [] ?Joint swelling   [] ?Joint pain   [] ?Low back pain Hematologic:  [] ?Easy bruising  [] ?Easy bleeding   [] ?Hypercoagulable state    [] ?Anemic   Gastrointestinal:  [] ?Blood in stool   [] ?Vomiting blood  [] ?Gastroesophageal reflux/heartburn   [] ?Abdominal pain Genitourinary:  [] ?Chronic kidney disease   [] ?Difficult urination  [] ?Frequent urination  [] ?Burning with urination   [] ?Hematuria Skin:  [] ?Rashes   [x] ?Ulcers   [x] ?Wounds Psychological:  [] ?History of anxiety   [] ? History of major depression.  Physical Examination  BP 128/83   Pulse 91   Ht 6\' 4"  (1.93 m)   Wt 240 lb (108.9 kg)   BMI 29.21 kg/m  Gen:  WD/WN, NAD Head: [place]/AT, No temporalis wasting. Ear/Nose/Throat: Hearing grossly intact, nares w/o erythema or drainage Eyes: Conjunctiva clear. Sclera non-icteric Neck: Supple.  Trachea midline Pulmonary:  Good  air movement, no use of accessory muscles.  Cardiac: Somewhat irregular Vascular:  Vessel Right Left  Radial Palpable Palpable                          Austin [name] Trace Palpable Not Palpable  DP 1+ Palpable Not Palpable    Musculoskeletal: M/S 5/5 throughout.  Left leg prosthesis in place status post amputation.  Small clean ulceration on the lateral aspect of the right foot under the fifth metatarsal head with some surrounding induration and darkened areas.  No or drainage.  1+ right lower extremity edema. Neurologic: Sensation grossly intact in extremities.  Symmetrical.  Speech is fluent.  Psychiatric: Judgment intact, Mood & affect appropriate for Austin [name]'s clinical situation. Dermatologic: Right foot wound as above       Labs Recent Results (from the past 2160 hour(s))  Urinalysis, Routine w reflex microscopic Urine, Clean Catch     Status: Abnormal   Collection Time: 08/16/20  6:45 PM  Result Value Ref Range   Color, Urine YELLOW YELLOW   APPearance CLEAR CLEAR   Specific Gravity, Urine 1.026 1.005 - 1.030   pH 5.0 5.0 - 8.0   Glucose, UA >=500 (A) NEGATIVE mg/dL   Hgb urine dipstick SMALL (A) NEGATIVE   Bilirubin Urine NEGATIVE NEGATIVE   Ketones, ur NEGATIVE NEGATIVE mg/dL    Protein, ur 30 (A) NEGATIVE mg/dL   Nitrite NEGATIVE NEGATIVE   Leukocytes,Ua SMALL (A) NEGATIVE   RBC / HPF 0-5 0 - 5 RBC/hpf   WBC, UA 6-10 0 - 5 WBC/hpf   Bacteria, UA RARE (A) NONE SEEN   Squamous Epithelial / LPF 0-5 0 - 5   Mucus PRESENT     Comment: Performed at [facility], [address] [place], [place] [address]  CBC with Differential/Platelet     Status: Abnormal   Collection Time: 08/16/20 11:51 PM  Result Value Ref Range   WBC 10.9 (H) 4.0 - 10.5 K/uL   RBC 5.33 4.22 - 5.81 MIL/uL   Hemoglobin 14.9 13.0 - 17.0 g/dL   HCT 98.145.5 39 - 52 %   MCV 85.4 80.0 - 100.0 fL   MCH 28.0 26.0 - 34.0 pg   MCHC 32.7 30.0 - 36.0 g/dL   RDW 19.115.3 47.811.5 - 29.515.5 %   Platelets 246 150 - 400 K/uL   nRBC 0.0 0.0 - 0.2 %   Neutrophils Relative % 62 %   Neutro Abs 6.8 1.7 - 7.7 K/uL   Lymphocytes Relative 29 %   Lymphs Abs 3.2 0.7 - 4.0 K/uL   Monocytes Relative 7 %   Monocytes Absolute 0.8 0.1 - 1.0 K/uL   Eosinophils Relative 1 %   Eosinophils Absolute 0.1 0.0 - 0.5 K/uL   Basophils Relative 0 %   Basophils Absolute 0.0 0.0 - 0.1 K/uL   Immature Granulocytes 1 %   Abs Immature Granulocytes 0.07 0.00 - 0.07 K/uL    Comment: Performed at [facility], [address] [place], [place] [address]  Basic metabolic panel     Status: Abnormal   Collection Time: 08/16/20 11:51 PM  Result Value Ref Range   Sodium 140 135 - 145 mmol/L   Potassium 4.0 3.5 - 5.1 mmol/L   Chloride 105 98 - 111 mmol/L   CO2 26 22 - 32 mmol/L   Glucose, Bld 146 (H) 70 - 99 mg/dL    Comment: Glucose reference range applies only to samples  taken after fasting for at least 8 hours.   BUN 11 8 - 23 mg/dL   Creatinine, Ser 3.29 0.61 - 1.24 mg/dL   Calcium 9.7 8.9 - 92.4 mg/dL   GFR, Estimated >26 >83 mL/min    Comment: (NOTE) Calculated using the CKD-EPI Creatinine Equation (2021)    Anion gap 9 5 - 15    Comment: Performed at [facility], [address], [place], [place] [address]     Radiology No results found.  Assessment/Plan Hyperlipidemia lipid control important in reducing the progression of atherosclerotic disease. Continue statin therapy   Diabetes (HCC) blood glucose control important in reducing the progression of atherosclerotic disease. Also, involved in wound healing. On appropriate medications.   Status post below-knee amputation (HCC) Well-healed  Atherosclerosis of native arteries of the extremities with ulceration (HCC) ABI today is 0.95 although the digital waveform remains reduced.  Perfusion does appear adequate for wound healing.  No role for intervention or vascular surgery at this time.  As long as he has a persistent ulcer, continue to follow on 61-month intervals.    Festus Barren, MD  08/22/2020 11:47 AM    This note was created with Dragon medical transcription system.  Any errors from dictation are purely unintentional

## 2020-08-22 NOTE — Patient Instructions (Signed)
Peripheral Vascular Disease  Peripheral vascular disease (PVD) is a disease of the blood vessels that are not part of your heart and brain. A simple term for PVD is poor circulation. In most cases, PVD narrows the blood vessels that carry blood from your heart to the rest of your body. This can reduce the supply of blood to your arms, legs, and internal organs, like your stomach or kidneys. However, PVD most often affects a person's lower legs and feet. Without treatment, PVD tends to get worse. PVD can also lead to acute ischemic limb. This is when an arm or leg suddenly cannot get enough blood. This is a medical emergency. Follow these instructions at home: Lifestyle  Do not use any products that contain nicotine or tobacco, such as cigarettes and e-cigarettes. If you need help quitting, ask your doctor.  Lose weight if you are overweight. Or, stay at a healthy weight as told by your doctor.  Eat a diet that is low in fat and cholesterol. If you need help, ask your doctor.  Exercise regularly. Ask your doctor for activities that are right for you. General instructions  Take over-the-counter and prescription medicines only as told by your doctor.  Take good care of your feet: ? Wear comfortable shoes that fit well. ? Check your feet often for any cuts or sores.  Keep all follow-up visits as told by your doctor This is important. Contact a doctor if:  You have cramps in your legs when you walk.  You have leg pain when you are at rest.  You have coldness in a leg or foot.  Your skin changes.  You are unable to get or have an erection (erectile dysfunction).  You have cuts or sores on your feet that do not heal. Get help right away if:  Your arm or leg turns cold, numb, and blue.  Your arms or legs become red, warm, swollen, painful, or numb.  You have chest pain.  You have trouble breathing.  You suddenly have weakness in your face, arm, or leg.  You become very  confused or you cannot speak.  You suddenly have a very bad headache.  You suddenly cannot see. Summary  Peripheral vascular disease (PVD) is a disease of the blood vessels.  A simple term for PVD is poor circulation. Without treatment, PVD tends to get worse.  Treatment may include exercise, low fat and low cholesterol diet, and quitting smoking. This information is not intended to replace advice given to you by your health care provider. Make sure you discuss any questions you have with your health care provider. Document Revised: 09/05/2017 Document Reviewed: 10/31/2016 Elsevier Patient Education  2020 [employer]  

## 2020-08-22 NOTE — Assessment & Plan Note (Signed)
ABI today is 0.95 although the digital waveform remains reduced.  Perfusion does appear adequate for wound healing.  No role for intervention or vascular surgery at this time.  As long as he has a persistent ulcer, continue to follow on 79-month intervals.

## 2020-10-12 ENCOUNTER — Other Ambulatory Visit: Payer: Self-pay

## 2020-10-12 ENCOUNTER — Encounter: Payer: Self-pay | Admitting: Urology

## 2020-10-12 ENCOUNTER — Ambulatory Visit (INDEPENDENT_AMBULATORY_CARE_PROVIDER_SITE_OTHER): Payer: Medicare Other | Admitting: Urology

## 2020-10-12 VITALS — BP 118/78 | HR 90 | Ht 76.0 in

## 2020-10-12 DIAGNOSIS — N481 Balanitis: Secondary | ICD-10-CM

## 2020-10-12 DIAGNOSIS — R300 Dysuria: Secondary | ICD-10-CM | POA: Diagnosis not present

## 2020-10-12 MED ORDER — NYSTATIN-TRIAMCINOLONE 100000-0.1 UNIT/GM-% EX OINT: 1.0000 "application " | TOPICAL_OINTMENT | Freq: Two times a day (BID) | CUTANEOUS | 0 refills | Status: DC

## 2020-10-12 NOTE — Patient Instructions (Signed)
Balanitis  Balanitis is swelling and irritation (inflammation) of the head of the penis (glans penis). The condition may also cause inflammation of the skin around the glans penis (foreskin) in men who have not been circumcised. It may develop because of an infection or another medical condition. Balanitis occurs most often among men who have not had their foreskin removed (uncircumcised men). Balanitis sometimes causes scarring of the penis or foreskin, which can require surgery. Untreated balanitis can increase the risk of penile cancer. What are the causes? Common causes of this condition include:  Poor personal hygiene, especially in uncircumcised men. Not cleaning the glans penis and foreskin well can result in buildup of bacteria, viruses, and yeast, which can lead to infection and inflammation.  Irritation and lack of air flow due to fluid (smegma) that can build up on the glans penis. Other causes include:  Chemical irritation from products such as soaps or shower gels (especially those that have fragrance), condoms, personal lubricants, petroleum jelly, spermicides, or fabric softeners.  Skin conditions, such as eczema, dermatitis, and psoriasis.  Allergies to medicines, such as tetracycline and sulfa drugs.  Certain medical conditions, including liver cirrhosis, congestive heart failure, diabetes, and kidney disease.  Infections, such as candidiasis, HPV (human papillomavirus), herpes simplex, gonorrhea, and syphilis.  Severe obesity. What increases the risk? The following factors may make you more likely to develop this condition:  Having diabetes. This is the most common risk factor.  Having a tight foreskin that is difficult to pull back (retract) past the glans.  Having sexual intercourse without using a condom. What are the signs or symptoms? Symptoms of this condition include:  Discharge from under the foreskin.  A bad smell.  Pain or difficulty retracting the  foreskin.  Tenderness, redness, and swelling of the glans.  A rash or sores on the glans or foreskin.  Itchiness.  Inability to get an erection due to pain.  Difficulty urinating.  Scarring of the penis or foreskin, in some cases. How is this diagnosed? This condition may be diagnosed based on:  A physical exam.  Testing a swab of discharge to check for bacterial or fungal infection.  Blood tests: ? To check for viruses that can cause balanitis. ? To check your blood sugar (glucose) level. High blood glucose could be a sign of diabetes, which can cause balanitis. How is this treated? Treatment for balanitis depends on the cause. Treatment may include:  Improving personal hygiene. Your health care provider may recommend sitting in a bath of warm water that is deep enough to cover your hips and buttocks (sitz bath).  Medicines such as: ? Creams or ointments to reduce swelling (steroids) or to treat an infection. ? Antibiotic medicine. ? Antifungal medicine.  Surgery to remove or cut the foreskin (circumcision). This may be done if you have scarring on the foreskin that makes it difficult to retract.  Controlling other medical problems that may be causing your condition or making it worse. Follow these instructions at home:  Do not have sex until the condition clears up, or until your health care provider approves.  Keep your penis clean and dry. Take sitz baths as recommended by your health care provider.  Avoid products that irritate your skin or make symptoms worse, such as soaps and shower gels that have fragrance.  Take over-the-counter and prescription medicines only as told by your health care provider. ? If you were prescribed an antibiotic medicine or a cream or ointment, use it as   told by your health care provider. Do not stop using your medicine, cream, or ointment even if you start to feel better. ? Do not drive or use heavy machinery while taking prescription  pain medicine. Contact a health care provider if:  Your symptoms get worse or do not improve with home care.  You develop chills or a fever.  You have trouble urinating.  You cannot retract your foreskin. Get help right away if:  You develop severe pain.  You are unable to urinate. Summary  Balanitis is inflammation of the head of the penis (glans penis) caused by irritation or infection.  Balanitis causes pain, redness, and swelling of the glans penis.  This condition is most common among uncircumcised men who do not keep their glans penis clean and in men who have diabetes.  Treatment may include creams or ointments.  Good hygiene is important for prevention. This includes pulling back the foreskin when washing your penis. This information is not intended to replace advice given to you by your health care provider. Make sure you discuss any questions you have with your health care provider. Document Revised: 09/05/2017 Document Reviewed: 08/12/2016 Elsevier Patient Education  2020 [employer]  

## 2020-10-12 NOTE — Progress Notes (Signed)
10/12/20 6:29 PM   Austin [name] D Ent 1956-02-25 [id number]  CC: Balanitis  HPI: Mr. Austin [name] is a comorbid 65 year old male with poorly controlled diabetes and peripheral vascular disease who reports multiple episodes of balanitis.  He will have some phimosis as well with painful foreskin, as well as some irritation at the foreskin and discomfort with urination.  He has been given antibiotics for this, but never tried any topical creams.  He denies any gross hematuria.  He also reports difficulty with erections.  Urinalysis is benign today with 0-5 WBCs, 0-2 RBCs, 0-10 epithelial cells, yeast present, nitrite negative, no leukocytes.   PMH: Past Medical History:  Diagnosis Date  . Diabetes (HCC)   . DVT (deep venous thrombosis) (HCC)   . Hyperlipidemia   . Hypertension   . Peripheral vascular disease [facility])     Surgical History: Past Surgical History:  Procedure Laterality Date  . AMPUTATION Left 10/28/2018   Procedure: AMPUTATION BELOW KNEE;  Surgeon: Annice Needy, MD;  Location: [facility] ORS;  Service: Vascular;  Laterality: Left;  . AMPUTATION TOE Left 02/10/2018   Procedure: AMPUTATION TOE;  Surgeon: Linus Galas, DPM;  Location: [facility] OR[facility];  Service: Podiatry;  Laterality: Left;  . APPLICATION OF WOUND VAC Left 10/16/2018   Procedure: APPLICATION OF WOUND VAC;  Surgeon: Gwyneth Revels, DPM;  Location: [facility];  Service: Podiatry;  Laterality: Left;  . groin surgery    . IRRIGATION AND DEBRIDEMENT FOOT Left 09/30/2018   Procedure: IRRIGATION AND DEBRIDEMENT FOOT;  Surgeon: Linus Galas, DPM;  Location: [facility];  Service: Podiatry;  Laterality: Left;  . LOWER EXTREMITY ANGIOGRAPHY Left 02/12/2018   Procedure: Lower Extremity Angiography;  Surgeon: Annice Needy, MD;  Location: [facility];  Service: Cardiovascular;  Laterality: Left;  . LOWER EXTREMITY ANGIOGRAPHY Left 10/01/2018   Procedure: Lower Extremity Angiography;  Surgeon: Annice Needy, MD;  Location: [facility];  Service: Cardiovascular;  Laterality: Left;  . LOWER EXTREMITY ANGIOGRAPHY Left 10/19/2018   Procedure: Lower Extremity Angiography;  Surgeon: Annice Needy, MD;  Location: [facility];  Service: Cardiovascular;  Laterality: Left;  . LOWER EXTREMITY ANGIOGRAPHY Left 10/20/2018   Procedure: LOWER EXTREMITY ANGIOGRAPHY;  Surgeon: Annice Needy, MD;  Location: [facility];  Service: Cardiovascular;  Laterality: Left;  . LOWER EXTREMITY ANGIOGRAPHY Right 11/25/2018   Procedure: LOWER EXTREMITY ANGIOGRAPHY;  Surgeon: Annice Needy, MD;  Location: [facility];  Service: Cardiovascular;  Laterality: Right;  . LOWER EXTREMITY ANGIOGRAPHY Right 01/10/2020   Procedure: LOWER EXTREMITY ANGIOGRAPHY;  Surgeon: Annice Needy, MD;  Location: [facility];  Service: Cardiovascular;  Laterality: Right;  . LOWER EXTREMITY INTERVENTION  02/12/2018   Procedure: LOWER EXTREMITY INTERVENTION;  Surgeon: Annice Needy, MD;  Location: [facility];  Service: Cardiovascular;;  . NECK SURGERY    . TRANSMETATARSAL AMPUTATION Left 10/16/2018   Procedure: TRANSMETATARSAL AMPUTATION LEFT FOOT;  Surgeon: Gwyneth Revels, DPM;  Location: [facility];  Service: Podiatry;  Laterality: Left;    Family History: Family History  Problem Relation Age of Onset  . Diabetes Mother   . Coronary artery disease Father   . Hypertension Sister   . Leukemia Brother     Social History:  reports that he quit smoking about 2 years ago. His smoking use included cigarettes. He has a 30.00 pack-year smoking history. He has never used smokeless tobacco. He reports that he does not drink alcohol and  does not use drugs.  Physical Exam: BP 118/78   Pulse 90   Ht 6\' 4"  (1.93 m)   BMI 29.21 kg/m    Constitutional:  Alert and oriented, No acute distress. Cardiovascular: No clubbing, cyanosis, or edema. Respiratory: Normal respiratory effort, no increased work of breathing. 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  We discussed the impact of his diabetes on recurrent infections and balanitis.  I recommended a 2-week trial of Mycolog cream, which I suspect will significantly improve his urinary symptoms.  We could consider a dorsal slit or circumcision in the future if he has persistent symptoms.  Mycolog cream for balanitis RTC 1 month symptom check with PA   75, MD 10/12/2020  [facility] [address] [place], [place] [phone]

## 2020-10-13 ENCOUNTER — Telehealth: Payer: Self-pay | Admitting: *Deleted

## 2020-10-13 LAB — URINALYSIS, COMPLETE
Bilirubin, UA: NEGATIVE
Ketones, UA: NEGATIVE
Leukocytes,UA: NEGATIVE
Nitrite, UA: NEGATIVE
Protein,UA: NEGATIVE
Specific Gravity, UA: 1.02 (ref 1.005–1.030)
Urobilinogen, Ur: 0.2 mg/dL (ref 0.2–1.0)
pH, UA: 6.5 (ref 5.0–7.5)

## 2020-10-13 LAB — MICROSCOPIC EXAMINATION: Bacteria, UA: NONE SEEN

## 2020-10-13 NOTE — Telephone Encounter (Signed)
Contacted in attempt to schedule lung screening scan yesterday. Patient requested call back today after he has time to evaluate his transportation availability. Contacted today and he requests a call back another day as he has not found out his availability yet.

## 2020-11-03 NOTE — Telephone Encounter (Signed)
Attempted to contact patient to schedule lung screening scan. However, there is no answer or voicemail option.

## 2020-11-13 ENCOUNTER — Ambulatory Visit: Payer: Self-pay | Admitting: Physician Assistant

## 2020-11-16 ENCOUNTER — Other Ambulatory Visit
Admission: RE | Admit: 2020-11-16 | Discharge: 2020-11-16 | Disposition: A | Discharge status: Home / Self Care | Payer: Medicare Other | Source: Ambulatory Visit | Attending: Podiatry | Admitting: Podiatry

## 2020-11-16 DIAGNOSIS — I739 Peripheral vascular disease, unspecified: Secondary | ICD-10-CM | POA: Insufficient documentation

## 2020-11-16 DIAGNOSIS — Z89512 Acquired absence of left leg below knee: Secondary | ICD-10-CM | POA: Diagnosis present

## 2020-11-16 DIAGNOSIS — E1142 Type 2 diabetes mellitus with diabetic polyneuropathy: Secondary | ICD-10-CM | POA: Diagnosis present

## 2020-11-16 DIAGNOSIS — L97511 Non-pressure chronic ulcer of other part of right foot limited to breakdown of skin: Secondary | ICD-10-CM | POA: Insufficient documentation

## 2020-11-16 DIAGNOSIS — L97512 Non-pressure chronic ulcer of other part of right foot with fat layer exposed: Secondary | ICD-10-CM | POA: Diagnosis present

## 2020-11-16 DIAGNOSIS — S90424A Blister (nonthermal), right lesser toe(s), initial encounter: Secondary | ICD-10-CM | POA: Diagnosis present

## 2020-11-20 ENCOUNTER — Other Ambulatory Visit: Payer: Self-pay

## 2020-11-20 ENCOUNTER — Ambulatory Visit (INDEPENDENT_AMBULATORY_CARE_PROVIDER_SITE_OTHER): Payer: Medicare Other | Admitting: Physician Assistant

## 2020-11-20 ENCOUNTER — Encounter: Payer: Self-pay | Admitting: Physician Assistant

## 2020-11-20 ENCOUNTER — Other Ambulatory Visit: Payer: Self-pay | Admitting: Physician Assistant

## 2020-11-20 VITALS — BP 119/72 | HR 67 | Ht 70.0 in | Wt 240.0 lb

## 2020-11-20 DIAGNOSIS — R351 Nocturia: Secondary | ICD-10-CM

## 2020-11-20 DIAGNOSIS — N481 Balanitis: Secondary | ICD-10-CM

## 2020-11-20 MED ORDER — NYSTATIN-TRIAMCINOLONE 100000-0.1 UNIT/GM-% EX OINT: 1.0000 "application " | TOPICAL_OINTMENT | Freq: Two times a day (BID) | CUTANEOUS | 0 refills | Status: DC

## 2020-11-20 NOTE — Patient Instructions (Signed)
1. Continue using the cream twice daily for another two weeks. Put it on the end of your foreskin and try to put it inside the opening of the foreskin too. 2. Reduce your fluid intake after dinnertime. 3. Try to prop your leg up in the afternoons to reduce swelling.

## 2020-11-20 NOTE — Progress Notes (Unsigned)
11/20/2020 3:45 PM   Austin [name] [birth year] [id number]  CC: Chief Complaint  Patient presents with  . Other   HPI: Austin [name] is a 65 y.o. male, and recurrent balanitis who presents today for symptom recheck on Mycolog ointment.  Today he reports minimal improvement in phimosis and foreskin irritation after using Mycolog.  It is unclear how often he used this or for how long.  Additionally, patient reports bothersome nocturia without daytime frequency.  He believes he is a snorer.  He notes decreased nocturia if he withholds fluids in the evening.  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    In-office UA today positive for 3+ glucose, trace lysed blood, and trace protein; urine microscopy with yeast.   PMH: Past Medical History:  Diagnosis Date  . Diabetes (HCC)   . DVT (deep venous thrombosis) (HCC)   . Hyperlipidemia   . Hypertension   . Peripheral vascular disease [facility])     Surgical History: Past Surgical History:  Procedure Laterality Date  . AMPUTATION Left 10/28/2018   Procedure: AMPUTATION BELOW KNEE;  Surgeon: Annice Needy, MD;  Location: [facility];  Service: Vascular;  Laterality: Left;  . AMPUTATION TOE Left 02/10/2018   Procedure: AMPUTATION TOE;  Surgeon: Linus Galas, DPM;  Location: [facility];  Service: Podiatry;  Laterality: Left;  . APPLICATION OF WOUND VAC Left 10/16/2018   Procedure: APPLICATION OF WOUND VAC;  Surgeon: Gwyneth Revels, DPM;  Location: [facility];  Service: Podiatry;  Laterality: Left;  . groin surgery    . IRRIGATION AND DEBRIDEMENT FOOT Left 09/30/2018   Procedure: IRRIGATION AND DEBRIDEMENT FOOT;  Surgeon: Linus Galas, DPM;  Location: [facility];  Service: Podiatry;  Laterality: Left;  . LOWER EXTREMITY ANGIOGRAPHY Left 02/12/2018   Procedure: Lower Extremity Angiography;  Surgeon: Annice Needy, MD;  Location: [facility];  Service: Cardiovascular;  Laterality: Left;  . LOWER EXTREMITY ANGIOGRAPHY Left 10/01/2018    Procedure: Lower Extremity Angiography;  Surgeon: Annice Needy, MD;  Location: [facility];  Service: Cardiovascular;  Laterality: Left;  . LOWER EXTREMITY ANGIOGRAPHY Left 10/19/2018   Procedure: Lower Extremity Angiography;  Surgeon: Annice Needy, MD;  Location: [facility];  Service: Cardiovascular;  Laterality: Left;  . LOWER EXTREMITY ANGIOGRAPHY Left 10/20/2018   Procedure: LOWER EXTREMITY ANGIOGRAPHY;  Surgeon: Annice Needy, MD;  Location: [facility];  Service: Cardiovascular;  Laterality: Left;  . LOWER EXTREMITY ANGIOGRAPHY Right 11/25/2018   Procedure: LOWER EXTREMITY ANGIOGRAPHY;  Surgeon: Annice Needy, MD;  Location: [facility];  Service: Cardiovascular;  Laterality: Right;  . LOWER EXTREMITY ANGIOGRAPHY Right 01/10/2020   Procedure: LOWER EXTREMITY ANGIOGRAPHY;  Surgeon: Annice Needy, MD;  Location: [facility];  Service: Cardiovascular;  Laterality: Right;  . LOWER EXTREMITY INTERVENTION  02/12/2018   Procedure: LOWER EXTREMITY INTERVENTION;  Surgeon: Annice Needy, MD;  Location: [facility];  Service: Cardiovascular;;  . NECK SURGERY    . TRANSMETATARSAL AMPUTATION Left 10/16/2018   Procedure: TRANSMETATARSAL AMPUTATION LEFT FOOT;  Surgeon: Gwyneth Revels, DPM;  Location: [facility];  Service: Podiatry;  Laterality: Left;    Home Medications:  Allergies as of 11/20/2020   No Known Allergies     Medication List       Accurate as of November 20, 2020  3:45 PM. If you have any questions, ask your nurse or doctor.  Accu-Chek Guide test strip Generic drug: glucose blood   apixaban 5 MG Tabs tablet Commonly known as: ELIQUIS Take 1 tablet (5 mg total) by mouth 2 (two) times daily.   aspirin EC 81 MG tablet Take 1 tablet (81 mg total) by mouth daily.   atorvastatin 80 MG tablet Commonly known as: LIPITOR Take 1 tablet (80 mg total) by mouth daily at 6 PM.   HumaLOG KwikPen 100 UNIT/ML KwikPen Generic drug: insulin  lispro   insulin aspart 100 UNIT/ML injection Commonly known as: novoLOG Inject 10 Units into the skin 3 (three) times daily with meals.   insulin glargine 100 UNIT/ML injection Commonly known as: LANTUS Inject 0.3 mLs (30 Units total) into the skin at bedtime.   mupirocin ointment 2 % Commonly known as: BACTROBAN APPLY TOPICALLY TWO (2) TIMES A DAY FOR 7 DAYS. TO WOUND ON LEG, UNTIL HEALED   nystatin-triamcinolone ointment Commonly known as: MYCOLOG Apply 1 application topically 2 (two) times daily.   polyethylene glycol 17 g packet Commonly known as: MiraLax Take 17 g by mouth daily.       Allergies:  No Known Allergies  Family History: Family History  Problem Relation Age of Onset  . Diabetes Mother   . Coronary artery disease Father   . Hypertension Sister   . Leukemia Brother     Social History:   reports that he quit smoking about 2 years ago. His smoking use included cigarettes. He has a 30.00 pack-year smoking history. He has never used smokeless tobacco. He reports that he does not drink alcohol and does not use drugs.  Physical Exam: BP 119/72   Pulse 67   Ht 5\' 10"  (1.778 m)   Wt 240 lb (108.9 kg)   BMI 34.44 kg/m   Constitutional:  Alert and oriented, no acute distress, nontoxic appearing HEENT: [place], AT Cardiovascular: No clubbing, cyanosis, or edema Respiratory: Normal respiratory effort, no increased work of breathing GU: Phimosis, unable to retract the foreskin.  Mild diffuse edema of the foreskin with 2 small skin splits at points of high tension Skin: No rashes, bruises or suspicious lesions Neurologic: Grossly intact, no focal deficits, moving all 4 extremities Psychiatric: Normal mood and affect  Laboratory Data: Results for orders placed or performed in visit on 11/20/20  Microscopic Examination   Urine  Result Value Ref Range   WBC, UA 0-5 0 - 5 /hpf   RBC 0-2 0 - 2 /hpf   Epithelial Cells (non renal) 0-10 0 - 10 /hpf   Bacteria, UA  None seen None seen/Few   Yeast, UA Present (A) None seen  Urinalysis, Complete  Result Value Ref Range   Specific Gravity, UA 1.015 1.005 - 1.030   pH, UA 5.0 5.0 - 7.5   Color, UA Yellow Yellow   Appearance Ur Cloudy (A) Clear   Leukocytes,UA Negative Negative   Protein,UA Trace (A)    Glucose, UA 3+ (A) Negative   Ketones, UA Negative Negative   RBC, UA Trace (A) Negative   Bilirubin, UA Negative Negative   Urobilinogen, Ur 0.2 0.2 - 1.0 mg/dL   Nitrite, UA Negative Negative   Microscopic Examination See below:    Assessment & Plan:   1. Balanitis Counseled patient to continue topical Mycolog ointment twice daily x2 weeks.  We will plan for symptom recheck in 1 month.  If there continues to be no improvement, I recommend returning to Dr. 11/22/20 for dorsal slit versus circumcision.  Patient is in agreement  with this plan. - nystatin-triamcinolone ointment (MYCOLOG); Apply 1 application topically 2 (two) times daily.  Dispense: 30 g; Refill: 0  2. Nocturia I explained that this is typically a multifactorial occurrence when it occurs in the absence of daytime frequency.  I counseled him to reduce fluids after dinnertime and elevate his RLE in the afternoon prior to bed.  Ultimately, he may benefit from sleep study for evaluation of OSA.  I do believe uncontrolled diabetes may be playing a role here.  Patient may benefit from condom catheter for management of urinary leakage overnight. - Urinalysis, Complete   Return in about 4 weeks (around 12/18/2020) for Symptom recheck.  Carman Ching, PA-C  [facility] [address], [address] [place], [place] [address] [phone]

## 2020-11-21 ENCOUNTER — Encounter (INDEPENDENT_AMBULATORY_CARE_PROVIDER_SITE_OTHER): Payer: Self-pay | Admitting: Nurse Practitioner

## 2020-11-21 ENCOUNTER — Ambulatory Visit (INDEPENDENT_AMBULATORY_CARE_PROVIDER_SITE_OTHER): Payer: Medicare Other | Admitting: Nurse Practitioner

## 2020-11-21 VITALS — BP 129/70 | HR 84 | Resp 16 | Wt 248.0 lb

## 2020-11-21 DIAGNOSIS — I10 Essential (primary) hypertension: Secondary | ICD-10-CM | POA: Diagnosis not present

## 2020-11-21 DIAGNOSIS — E119 Type 2 diabetes mellitus without complications: Secondary | ICD-10-CM | POA: Diagnosis not present

## 2020-11-21 DIAGNOSIS — I7025 Atherosclerosis of native arteries of other extremities with ulceration: Secondary | ICD-10-CM

## 2020-11-21 LAB — URINALYSIS, COMPLETE
Bilirubin, UA: NEGATIVE
Ketones, UA: NEGATIVE
Leukocytes,UA: NEGATIVE
Nitrite, UA: NEGATIVE
Specific Gravity, UA: 1.015 (ref 1.005–1.030)
Urobilinogen, Ur: 0.2 mg/dL (ref 0.2–1.0)
pH, UA: 5 (ref 5.0–7.5)

## 2020-11-21 LAB — MICROSCOPIC EXAMINATION: Bacteria, UA: NONE SEEN

## 2020-11-21 NOTE — Progress Notes (Signed)
Subjective:    Patient ID: Austin [name], male    DOB: [birth year], 65 y.o.   MRN: [id number] Chief Complaint  Patient presents with  . Follow-up    3 month followup    The patient presents today for evaluation given his peripheral arterial disease and a slow healing ulceration that has been there for some time.  Previous noninvasive studies indicate the patient should have adequate perfusion for wound healing.  The patient has undergone many peripheral vascular interventions previously on his right lower extremity.  However since the patient's last visit he developed a new wound a very large blister on his medial great toe.  This is been treated by his podiatrist, Dr. Cleda Mccreedy.  The wound bed is red and beefy and he has upcoming follow-up shortly.  He was recently started on antibiotics and notes that the pain.  Previously been having much better and is also less swelling than it previously was.   Review of Systems  Cardiovascular: Positive for leg swelling.  Skin: Positive for wound.  Neurological: Positive for weakness.  All other systems reviewed and are negative.      Objective:   Physical Exam Vitals reviewed.  HENT:     Head: Normocephalic.  Cardiovascular:     Rate and Rhythm: Normal rate.     Pulses: Normal pulses.  Pulmonary:     Effort: Pulmonary effort is normal.  Musculoskeletal:     Left Lower Extremity: Left leg is amputated below knee.  Feet:     Right foot:     Skin integrity: Ulcer and blister present.  Neurological:     Mental Status: He is alert and oriented to person, place, and time.  Psychiatric:        Mood and Affect: Mood normal.        Behavior: Behavior normal.        Thought Content: Thought content normal.        Judgment: Judgment normal.     BP 129/70 (BP Location: Left Arm)   Pulse 84   Resp 16   Wt 248 lb (112.5 kg)   BMI 35.58 kg/m   Past Medical History:  Diagnosis Date  . Diabetes ([place])   . DVT (deep venous thrombosis) ([place])    . Hyperlipidemia   . Hypertension   . Peripheral vascular disease ([place])     Social History   Socioeconomic History  . Marital status: Divorced    Spouse name: Not on file  . Number of children: Not on file  . Years of education: Not on file  . Highest education level: Not on file  Occupational History  . Not on file  Tobacco Use  . Smoking status: Former Smoker    Packs/day: 1.00    Years: 30.00    Pack years: 30.00    Types: Cigarettes    Quit date: 10/07/2018    Years since quitting: 2.1  . Smokeless tobacco: Never Used  Vaping Use  . Vaping Use: Never used  Substance and Sexual Activity  . Alcohol use: No  . Drug use: No  . Sexual activity: Not Currently  Other Topics Concern  . Not on file  Social History Narrative  . Not on file   Social Determinants of Health   Financial Resource Strain: Not on file  Food Insecurity: Not on file  Transportation Needs: Not on file  Physical Activity: Not on file  Stress: Not on file  Social Connections: Not on file  Intimate Partner Violence: Not on file    Past Surgical History:  Procedure Laterality Date  . AMPUTATION Left 10/28/2018   Procedure: AMPUTATION BELOW KNEE;  Surgeon: Algernon Huxley, MD;  Location: [facility];  Service: Vascular;  Laterality: Left;  . AMPUTATION TOE Left 02/10/2018   Procedure: AMPUTATION TOE;  Surgeon: Sharlotte Alamo, DPM;  Location: [facility];  Service: Podiatry;  Laterality: Left;  . APPLICATION OF WOUND VAC Left 10/16/2018   Procedure: APPLICATION OF WOUND VAC;  Surgeon: Samara Deist, DPM;  Location: [facility];  Service: Podiatry;  Laterality: Left;  . groin surgery    . IRRIGATION AND DEBRIDEMENT FOOT Left 09/30/2018   Procedure: IRRIGATION AND DEBRIDEMENT FOOT;  Surgeon: Sharlotte Alamo, DPM;  Location: [facility];  Service: Podiatry;  Laterality: Left;  . LOWER EXTREMITY ANGIOGRAPHY Left 02/12/2018   Procedure: Lower Extremity Angiography;  Surgeon: Algernon Huxley, MD;  Location: [place] [facility];   Service: Cardiovascular;  Laterality: Left;  . LOWER EXTREMITY ANGIOGRAPHY Left 10/01/2018   Procedure: Lower Extremity Angiography;  Surgeon: Algernon Huxley, MD;  Location: [place] [facility];  Service: Cardiovascular;  Laterality: Left;  . LOWER EXTREMITY ANGIOGRAPHY Left 10/19/2018   Procedure: Lower Extremity Angiography;  Surgeon: Algernon Huxley, MD;  Location: [place] [facility];  Service: Cardiovascular;  Laterality: Left;  . LOWER EXTREMITY ANGIOGRAPHY Left 10/20/2018   Procedure: LOWER EXTREMITY ANGIOGRAPHY;  Surgeon: Algernon Huxley, MD;  Location: [place] [facility];  Service: Cardiovascular;  Laterality: Left;  . LOWER EXTREMITY ANGIOGRAPHY Right 11/25/2018   Procedure: LOWER EXTREMITY ANGIOGRAPHY;  Surgeon: Algernon Huxley, MD;  Location: [place] [facility];  Service: Cardiovascular;  Laterality: Right;  . LOWER EXTREMITY ANGIOGRAPHY Right 01/10/2020   Procedure: LOWER EXTREMITY ANGIOGRAPHY;  Surgeon: Algernon Huxley, MD;  Location: [place] [facility];  Service: Cardiovascular;  Laterality: Right;  . LOWER EXTREMITY INTERVENTION  02/12/2018   Procedure: LOWER EXTREMITY INTERVENTION;  Surgeon: Algernon Huxley, MD;  Location: [place] [facility];  Service: Cardiovascular;;  . NECK SURGERY    . TRANSMETATARSAL AMPUTATION Left 10/16/2018   Procedure: TRANSMETATARSAL AMPUTATION LEFT FOOT;  Surgeon: Samara Deist, DPM;  Location: [facility];  Service: Podiatry;  Laterality: Left;    Family History  Problem Relation Age of Onset  . Diabetes Mother   . Coronary artery disease Father   . Hypertension Sister   . Leukemia Brother     No Known Allergies  CBC Latest Ref Rng & Units 08/16/2020 10/30/2018 10/29/2018  WBC 4.0 - 10.5 K/uL 10.9(H) 10.0 12.6(H)  Hemoglobin 13.0 - 17.0 g/dL 14.9 7.1(L) 8.1(L)  Hematocrit 39.0 - 52.0 % 45.5 22.3(L) 25.2(L)  Platelets 150 - 400 K/uL 246 213 242      CMP     Component Value Date/Time   NA 140 08/16/2020 2351   NA 135 (L) 11/17/2013 0621   K 4.0  08/16/2020 2351   K 4.3 11/17/2013 0621   CL 105 08/16/2020 2351   CL 99 11/17/2013 0621   CO2 26 08/16/2020 2351   CO2 31 11/17/2013 0621   GLUCOSE 146 (H) 08/16/2020 2351   GLUCOSE 172 (H) 11/17/2013 0621   BUN 11 08/16/2020 2351   BUN 10 11/17/2013 0621   CREATININE 1.05 08/16/2020 2351   CREATININE 0.85 11/17/2013 0621   CALCIUM 9.7 08/16/2020 2351   CALCIUM 9.6 11/17/2013 0621   PROT 6.9 09/30/2018 0337   PROT 8.5 (H) 01/10/2013 2155   ALBUMIN 3.3 (L)  09/30/2018 0337   ALBUMIN 4.0 01/10/2013 2155   AST 19 09/30/2018 0337   AST 16 01/10/2013 2155   ALT 17 09/30/2018 0337   ALT 26 01/10/2013 2155   ALKPHOS 81 09/30/2018 0337   ALKPHOS 91 01/10/2013 2155   BILITOT 0.9 09/30/2018 0337   BILITOT 0.3 01/10/2013 2155   GFRNONAA >60 08/16/2020 2351   GFRNONAA >60 11/17/2013 0621   GFRAA >60 01/10/2020 0808   GFRAA >60 11/17/2013 0621     No results found.     Assessment & Plan:   1. Atherosclerosis of native arteries of the extremities with ulceration ([place]) The patient has developed a new wound on his medial toe that is being followed by podiatry.  We will follow up on a sooner basis with noninvasive studies to ascertain whether further vascular intervention may be necessary.  Patient is advised to contact our office sooner if the wounds begin to deteriorate.  Patient will follow up in 6 weeks with noninvasive studies - VAS [place] ABI WITH/WO TBI; Future  2. Type 2 diabetes mellitus without complication, without long-term current use of insulin (HCC) Continue hypoglycemic medications as already ordered, these medications have been reviewed and there are no changes at this time.  Hgb A1C to be monitored as already arranged by primary service   3. Essential hypertension Continue antihypertensive medications as already ordered, these medications have been reviewed and there are no changes at this time.    Current Outpatient Medications on File Prior to Visit  Medication  Sig Dispense Refill  . ACCU-CHEK GUIDE test strip     . amoxicillin-clavulanate (AUGMENTIN) 875-125 MG tablet Take by mouth.    Marland Kitchen apixaban (ELIQUIS) 5 MG TABS tablet Take 1 tablet (5 mg total) by mouth 2 (two) times daily. 60 tablet O  . aspirin EC 81 MG tablet Take 1 tablet (81 mg total) by mouth daily. 150 tablet 2  . atorvastatin (LIPITOR) 80 MG tablet Take 1 tablet (80 mg total) by mouth daily at 6 PM. 30 tablet 0  . Blood Glucose Monitoring Suppl (ACCU-CHEK GUIDE) w/Device KIT USE 1 KIT AS DIRECTED FOUR TIMES A DAY    . HUMALOG KWIKPEN 100 UNIT/ML KwikPen 24 Units 3 (three) times daily.    . insulin aspart (NOVOLOG) 100 UNIT/ML injection Inject 10 Units into the skin 3 (three) times daily with meals. 10 mL 0  . insulin glargine (LANTUS) 100 UNIT/ML injection Inject 0.3 mLs (30 Units total) into the skin at bedtime. 10 mL 11  . JARDIANCE 10 MG TABS tablet Take 10 mg by mouth daily.    . mupirocin ointment (BACTROBAN) 2 % APPLY TOPICALLY TWO (2) TIMES A DAY FOR 7 DAYS. TO WOUND ON LEG, UNTIL HEALED    . nystatin-triamcinolone ointment (MYCOLOG) Apply 1 application topically 2 (two) times daily. 30 g 0  . polyethylene glycol (MIRALAX) packet Take 17 g by mouth daily. 14 each 0  . TRUEPLUS 5-BEVEL PEN NEEDLES 31G X 6 MM MISC      No current facility-administered medications on file prior to visit.    There are no Patient Instructions on file for this visit. No follow-ups on file.   Kris Hartmann, NP

## 2020-11-22 LAB — AEROBIC/ANAEROBIC CULTURE W GRAM STAIN (SURGICAL/DEEP WOUND)

## 2020-11-30 ENCOUNTER — Telehealth (INDEPENDENT_AMBULATORY_CARE_PROVIDER_SITE_OTHER): Payer: Self-pay | Admitting: Vascular Surgery

## 2020-11-30 NOTE — Telephone Encounter (Signed)
Called stating that patient came in to their office to be seen for diabetic ulcer. They preformed scans revealing that patient has a bone infection. Baxter Hire stated that Dr. Alberteen Spindle would like him to come in to be seen asap. Patient was last seen 11/21/20 as a 3 month f/u with Fb. Dr. Dory Larsen office will fax over notes from his visit today. Please advise.

## 2020-11-30 NOTE — Telephone Encounter (Signed)
Let's get him in for ABIs.Marland Kitchenwe may be able to squeeze him in tomorrow

## 2020-12-01 ENCOUNTER — Encounter (INDEPENDENT_AMBULATORY_CARE_PROVIDER_SITE_OTHER): Payer: Self-pay | Admitting: Nurse Practitioner

## 2020-12-01 ENCOUNTER — Other Ambulatory Visit: Payer: Self-pay

## 2020-12-01 ENCOUNTER — Ambulatory Visit (INDEPENDENT_AMBULATORY_CARE_PROVIDER_SITE_OTHER): Payer: Medicare Other

## 2020-12-01 ENCOUNTER — Ambulatory Visit (INDEPENDENT_AMBULATORY_CARE_PROVIDER_SITE_OTHER): Payer: Medicare Other | Admitting: Nurse Practitioner

## 2020-12-01 VITALS — BP 128/74 | HR 91 | Ht 76.0 in | Wt 220.0 lb

## 2020-12-01 DIAGNOSIS — E782 Mixed hyperlipidemia: Secondary | ICD-10-CM

## 2020-12-01 DIAGNOSIS — E119 Type 2 diabetes mellitus without complications: Secondary | ICD-10-CM | POA: Diagnosis not present

## 2020-12-01 DIAGNOSIS — I10 Essential (primary) hypertension: Secondary | ICD-10-CM

## 2020-12-01 DIAGNOSIS — I7025 Atherosclerosis of native arteries of other extremities with ulceration: Secondary | ICD-10-CM | POA: Diagnosis not present

## 2020-12-01 NOTE — Progress Notes (Signed)
Subjective:    Patient ID: Austin [name], male    DOB: 1956/02/04, 65 y.o.   MRN: [id number] Chief Complaint  Patient presents with  . Follow-up    Add on per phone note    The patient presents today as an urgent referral from podiatry.  The patient has a diabetic ulcer on the plantar surface of his right foot.  He also had a previous blister on his right great toe.  Per Dr. Cleda Mccreedy, with podiatry the patient has evidence of osteomyelitis.  Currently the patient denies any fever, chills, nausea, vomiting or diarrhea.  He also noticed that the wound has worsened.  He has a previous history of a left below-knee amputation.  Noninvasive studies show an ABI of 85 on the right lower extremity with a biphasic anterior tibial artery waveform with monophasic posterior tibial artery waveforms.  The patient has abnormal toe waveforms.   Review of Systems  Cardiovascular: Positive for leg swelling.  Skin: Positive for wound.  All other systems reviewed and are negative.      Objective:   Physical Exam Vitals reviewed.  HENT:     Head: Normocephalic.  Cardiovascular:     Rate and Rhythm: Normal rate and regular rhythm.     Pulses:          Dorsalis pedis pulses are detected w/ Doppler on the right side.       Posterior tibial pulses are detected w/ Doppler on the right side.  Pulmonary:     Effort: Pulmonary effort is normal.  Musculoskeletal:     Left Lower Extremity: Left leg is amputated below knee.  Feet:     Right foot:     Skin integrity: Ulcer present.  Neurological:     Mental Status: He is alert and oriented to person, place, and time.     Motor: Weakness present.     Gait: Gait abnormal.  Psychiatric:        Mood and Affect: Mood normal.        Behavior: Behavior normal.        Thought Content: Thought content normal.        Judgment: Judgment normal.     BP 128/74   Pulse 91   Ht '6\' 4"'  (1.93 m)   Wt 220 lb (99.8 kg) Comment: per pt  BMI 26.78 kg/m   Past  Medical History:  Diagnosis Date  . Diabetes ([place])   . DVT (deep venous thrombosis) ([place])   . Hyperlipidemia   . Hypertension   . Peripheral vascular disease ([place])     Social History   Socioeconomic History  . Marital status: Divorced    Spouse name: Not on file  . Number of children: Not on file  . Years of education: Not on file  . Highest education level: Not on file  Occupational History  . Not on file  Tobacco Use  . Smoking status: Former Smoker    Packs/day: 1.00    Years: 30.00    Pack years: 30.00    Types: Cigarettes    Quit date: 10/07/2018    Years since quitting: 2.1  . Smokeless tobacco: Never Used  Vaping Use  . Vaping Use: Never used  Substance and Sexual Activity  . Alcohol use: No  . Drug use: No  . Sexual activity: Not Currently  Other Topics Concern  . Not on file  Social History Narrative  . Not on file   Social Determinants of  Health   Financial Resource Strain: Not on file  Food Insecurity: Not on file  Transportation Needs: Not on file  Physical Activity: Not on file  Stress: Not on file  Social Connections: Not on file  Intimate Partner Violence: Not on file    Past Surgical History:  Procedure Laterality Date  . AMPUTATION Left 10/28/2018   Procedure: AMPUTATION BELOW KNEE;  Surgeon: Algernon Huxley, MD;  Location: [facility];  Service: Vascular;  Laterality: Left;  . AMPUTATION TOE Left 02/10/2018   Procedure: AMPUTATION TOE;  Surgeon: Sharlotte Alamo, DPM;  Location: [facility];  Service: Podiatry;  Laterality: Left;  . APPLICATION OF WOUND VAC Left 10/16/2018   Procedure: APPLICATION OF WOUND VAC;  Surgeon: Samara Deist, DPM;  Location: [facility];  Service: Podiatry;  Laterality: Left;  . groin surgery    . IRRIGATION AND DEBRIDEMENT FOOT Left 09/30/2018   Procedure: IRRIGATION AND DEBRIDEMENT FOOT;  Surgeon: Sharlotte Alamo, DPM;  Location: [facility];  Service: Podiatry;  Laterality: Left;  . LOWER EXTREMITY ANGIOGRAPHY Left 02/12/2018   Procedure:  Lower Extremity Angiography;  Surgeon: Algernon Huxley, MD;  Location: [place] [facility];  Service: Cardiovascular;  Laterality: Left;  . LOWER EXTREMITY ANGIOGRAPHY Left 10/01/2018   Procedure: Lower Extremity Angiography;  Surgeon: Algernon Huxley, MD;  Location: [place] [facility];  Service: Cardiovascular;  Laterality: Left;  . LOWER EXTREMITY ANGIOGRAPHY Left 10/19/2018   Procedure: Lower Extremity Angiography;  Surgeon: Algernon Huxley, MD;  Location: [place] [facility];  Service: Cardiovascular;  Laterality: Left;  . LOWER EXTREMITY ANGIOGRAPHY Left 10/20/2018   Procedure: LOWER EXTREMITY ANGIOGRAPHY;  Surgeon: Algernon Huxley, MD;  Location: [place] [facility];  Service: Cardiovascular;  Laterality: Left;  . LOWER EXTREMITY ANGIOGRAPHY Right 11/25/2018   Procedure: LOWER EXTREMITY ANGIOGRAPHY;  Surgeon: Algernon Huxley, MD;  Location: [place] [facility];  Service: Cardiovascular;  Laterality: Right;  . LOWER EXTREMITY ANGIOGRAPHY Right 01/10/2020   Procedure: LOWER EXTREMITY ANGIOGRAPHY;  Surgeon: Algernon Huxley, MD;  Location: [place] [facility];  Service: Cardiovascular;  Laterality: Right;  . LOWER EXTREMITY INTERVENTION  02/12/2018   Procedure: LOWER EXTREMITY INTERVENTION;  Surgeon: Algernon Huxley, MD;  Location: [place] [facility];  Service: Cardiovascular;;  . NECK SURGERY    . TRANSMETATARSAL AMPUTATION Left 10/16/2018   Procedure: TRANSMETATARSAL AMPUTATION LEFT FOOT;  Surgeon: Samara Deist, DPM;  Location: [facility];  Service: Podiatry;  Laterality: Left;    Family History  Problem Relation Age of Onset  . Diabetes Mother   . Coronary artery disease Father   . Hypertension Sister   . Leukemia Brother     No Known Allergies  CBC Latest Ref Rng & Units 08/16/2020 10/30/2018 10/29/2018  WBC 4.0 - 10.5 K/uL 10.9(H) 10.0 12.6(H)  Hemoglobin 13.0 - 17.0 g/dL 14.9 7.1(L) 8.1(L)  Hematocrit 39.0 - 52.0 % 45.5 22.3(L) 25.2(L)  Platelets 150 - 400 K/uL 246 213 242      CMP      Component Value Date/Time   NA 140 08/16/2020 2351   NA 135 (L) 11/17/2013 0621   K 4.0 08/16/2020 2351   K 4.3 11/17/2013 0621   CL 105 08/16/2020 2351   CL 99 11/17/2013 0621   CO2 26 08/16/2020 2351   CO2 31 11/17/2013 0621   GLUCOSE 146 (H) 08/16/2020 2351   GLUCOSE 172 (H) 11/17/2013 0621   BUN 11 08/16/2020 2351   BUN 10 11/17/2013 0621   CREATININE  1.05 08/16/2020 2351   CREATININE 0.85 11/17/2013 0621   CALCIUM 9.7 08/16/2020 2351   CALCIUM 9.6 11/17/2013 0621   PROT 6.9 09/30/2018 0337   PROT 8.5 (H) 01/10/2013 2155   ALBUMIN 3.3 (L) 09/30/2018 0337   ALBUMIN 4.0 01/10/2013 2155   AST 19 09/30/2018 0337   AST 16 01/10/2013 2155   ALT 17 09/30/2018 0337   ALT 26 01/10/2013 2155   ALKPHOS 81 09/30/2018 0337   ALKPHOS 91 01/10/2013 2155   BILITOT 0.9 09/30/2018 0337   BILITOT 0.3 01/10/2013 2155   GFRNONAA >60 08/16/2020 2351   GFRNONAA >60 11/17/2013 0621   GFRAA >60 01/10/2020 0808   GFRAA >60 11/17/2013 0621     No results found.     Assessment & Plan:   1. Atherosclerosis of native arteries of the extremities with ulceration ([place])  Recommend:  The patient has worsening diabetic ulcer as well as worsening of the previous blister on the great toe of the right lower extremity.  Also the patient has developed osteomyelitis which also increases the necessity for angiogram to measure the best possible perfusion for wound healing.  The patient has evidence of severe atherosclerotic changes of both lower extremities associated with ulceration and tissue loss of the foot.  This represents a limb threatening ischemia and places the patient at the risk for limb loss.  Patient should undergo angiography of the lower extremities with the hope for intervention for limb salvage.  The risks and benefits as well as the alternative therapies was discussed in detail with the patient.  All questions were answered.  Patient agrees to proceed with angiography.  The patient  will follow up with me in the office after the procedure.    2. Type 2 diabetes mellitus without complication, without long-term current use of insulin (HCC) Continue hypoglycemic medications as already ordered, these medications have been reviewed and there are no changes at this time.  Hgb A1C to be monitored as already arranged by primary service   3. Essential hypertension Continue antihypertensive medications as already ordered, these medications have been reviewed and there are no changes at this time.   4. Mixed hyperlipidemia Continue statin as ordered and reviewed, no changes at this time    Current Outpatient Medications on File Prior to Visit  Medication Sig Dispense Refill  . ACCU-CHEK GUIDE test strip     . amoxicillin-clavulanate (AUGMENTIN) 875-125 MG tablet Take by mouth.    Marland Kitchen apixaban (ELIQUIS) 5 MG TABS tablet Take 1 tablet (5 mg total) by mouth 2 (two) times daily. 60 tablet O  . aspirin EC 81 MG tablet Take 1 tablet (81 mg total) by mouth daily. 150 tablet 2  . atorvastatin (LIPITOR) 80 MG tablet Take 1 tablet (80 mg total) by mouth daily at 6 PM. 30 tablet 0  . Blood Glucose Monitoring Suppl (ACCU-CHEK GUIDE) w/Device KIT USE 1 KIT AS DIRECTED FOUR TIMES A DAY    . Docusate Sodium (DSS) 100 MG CAPS Take by mouth.    . empagliflozin (JARDIANCE) 10 MG TABS tablet     . HUMALOG KWIKPEN 100 UNIT/ML KwikPen 24 Units 3 (three) times daily.    . insulin aspart (NOVOLOG) 100 UNIT/ML injection Inject 10 Units into the skin 3 (three) times daily with meals. 10 mL 0  . insulin glargine (LANTUS) 100 UNIT/ML injection Inject 0.3 mLs (30 Units total) into the skin at bedtime. 10 mL 11  . losartan (COZAAR) 25 MG tablet     .  mupirocin ointment (BACTROBAN) 2 % APPLY TOPICALLY TWO (2) TIMES A DAY FOR 7 DAYS. TO WOUND ON LEG, UNTIL HEALED    . nystatin-triamcinolone ointment (MYCOLOG) APPLY TO AFFECTED AREA TWICE A DAY 30 g 0  . polyethylene glycol (MIRALAX) packet Take 17 g by  mouth daily. 14 each 0  . TRUEPLUS 5-BEVEL PEN NEEDLES 31G X 6 MM MISC      No current facility-administered medications on file prior to visit.    There are no Patient Instructions on file for this visit. No follow-ups on file.   Kris Hartmann, NP

## 2020-12-01 NOTE — H&P (View-Only) (Signed)
Subjective:    Patient ID: Austin [name], male    DOB: 1956/09/26, 65 y.o.   MRN: [id number] Chief Complaint  Patient presents with  . Follow-up    Add on per phone note    The patient presents today as an urgent referral from podiatry.  The patient has a diabetic ulcer on the plantar surface of his right foot.  He also had a previous blister on his right great toe.  Per Dr. Cleda Mccreedy, with podiatry the patient has evidence of osteomyelitis.  Currently the patient denies any fever, chills, nausea, vomiting or diarrhea.  He also noticed that the wound has worsened.  He has a previous history of a left below-knee amputation.  Noninvasive studies show an ABI of 85 on the right lower extremity with a biphasic anterior tibial artery waveform with monophasic posterior tibial artery waveforms.  The patient has abnormal toe waveforms.   Review of Systems  Cardiovascular: Positive for leg swelling.  Skin: Positive for wound.  All other systems reviewed and are negative.      Objective:   Physical Exam Vitals reviewed.  HENT:     Head: Normocephalic.  Cardiovascular:     Rate and Rhythm: Normal rate and regular rhythm.     Pulses:          Dorsalis pedis pulses are detected w/ Doppler on the right side.       Posterior tibial pulses are detected w/ Doppler on the right side.  Pulmonary:     Effort: Pulmonary effort is normal.  Musculoskeletal:     Left Lower Extremity: Left leg is amputated below knee.  Feet:     Right foot:     Skin integrity: Ulcer present.  Neurological:     Mental Status: He is alert and oriented to person, place, and time.     Motor: Weakness present.     Gait: Gait abnormal.  Psychiatric:        Mood and Affect: Mood normal.        Behavior: Behavior normal.        Thought Content: Thought content normal.        Judgment: Judgment normal.     BP 128/74   Pulse 91   Ht '6\' 4"'  (1.93 m)   Wt 220 lb (99.8 kg) Comment: per pt  BMI 26.78 kg/m   Past  Medical History:  Diagnosis Date  . Diabetes ([place])   . DVT (deep venous thrombosis) ([place])   . Hyperlipidemia   . Hypertension   . Peripheral vascular disease ([place])     Social History   Socioeconomic History  . Marital status: Divorced    Spouse name: Not on file  . Number of children: Not on file  . Years of education: Not on file  . Highest education level: Not on file  Occupational History  . Not on file  Tobacco Use  . Smoking status: Former Smoker    Packs/day: 1.00    Years: 30.00    Pack years: 30.00    Types: Cigarettes    Quit date: 10/07/2018    Years since quitting: 2.1  . Smokeless tobacco: Never Used  Vaping Use  . Vaping Use: Never used  Substance and Sexual Activity  . Alcohol use: No  . Drug use: No  . Sexual activity: Not Currently  Other Topics Concern  . Not on file  Social History Narrative  . Not on file   Social Determinants of  Health   Financial Resource Strain: Not on file  Food Insecurity: Not on file  Transportation Needs: Not on file  Physical Activity: Not on file  Stress: Not on file  Social Connections: Not on file  Intimate Partner Violence: Not on file    Past Surgical History:  Procedure Laterality Date  . AMPUTATION Left 10/28/2018   Procedure: AMPUTATION BELOW KNEE;  Surgeon: Algernon Huxley, MD;  Location: [facility];  Service: Vascular;  Laterality: Left;  . AMPUTATION TOE Left 02/10/2018   Procedure: AMPUTATION TOE;  Surgeon: Sharlotte Alamo, DPM;  Location: [facility];  Service: Podiatry;  Laterality: Left;  . APPLICATION OF WOUND VAC Left 10/16/2018   Procedure: APPLICATION OF WOUND VAC;  Surgeon: Samara Deist, DPM;  Location: [facility];  Service: Podiatry;  Laterality: Left;  . groin surgery    . IRRIGATION AND DEBRIDEMENT FOOT Left 09/30/2018   Procedure: IRRIGATION AND DEBRIDEMENT FOOT;  Surgeon: Sharlotte Alamo, DPM;  Location: [facility];  Service: Podiatry;  Laterality: Left;  . LOWER EXTREMITY ANGIOGRAPHY Left 02/12/2018   Procedure:  Lower Extremity Angiography;  Surgeon: Algernon Huxley, MD;  Location: [place] [facility];  Service: Cardiovascular;  Laterality: Left;  . LOWER EXTREMITY ANGIOGRAPHY Left 10/01/2018   Procedure: Lower Extremity Angiography;  Surgeon: Algernon Huxley, MD;  Location: [place] [facility];  Service: Cardiovascular;  Laterality: Left;  . LOWER EXTREMITY ANGIOGRAPHY Left 10/19/2018   Procedure: Lower Extremity Angiography;  Surgeon: Algernon Huxley, MD;  Location: [place] [facility];  Service: Cardiovascular;  Laterality: Left;  . LOWER EXTREMITY ANGIOGRAPHY Left 10/20/2018   Procedure: LOWER EXTREMITY ANGIOGRAPHY;  Surgeon: Algernon Huxley, MD;  Location: [place] [facility];  Service: Cardiovascular;  Laterality: Left;  . LOWER EXTREMITY ANGIOGRAPHY Right 11/25/2018   Procedure: LOWER EXTREMITY ANGIOGRAPHY;  Surgeon: Algernon Huxley, MD;  Location: [place] [facility];  Service: Cardiovascular;  Laterality: Right;  . LOWER EXTREMITY ANGIOGRAPHY Right 01/10/2020   Procedure: LOWER EXTREMITY ANGIOGRAPHY;  Surgeon: Algernon Huxley, MD;  Location: [place] [facility];  Service: Cardiovascular;  Laterality: Right;  . LOWER EXTREMITY INTERVENTION  02/12/2018   Procedure: LOWER EXTREMITY INTERVENTION;  Surgeon: Algernon Huxley, MD;  Location: [place] [facility];  Service: Cardiovascular;;  . NECK SURGERY    . TRANSMETATARSAL AMPUTATION Left 10/16/2018   Procedure: TRANSMETATARSAL AMPUTATION LEFT FOOT;  Surgeon: Samara Deist, DPM;  Location: [facility];  Service: Podiatry;  Laterality: Left;    Family History  Problem Relation Age of Onset  . Diabetes Mother   . Coronary artery disease Father   . Hypertension Sister   . Leukemia Brother     No Known Allergies  CBC Latest Ref Rng & Units 08/16/2020 10/30/2018 10/29/2018  WBC 4.0 - 10.5 K/uL 10.9(H) 10.0 12.6(H)  Hemoglobin 13.0 - 17.0 g/dL 14.9 7.1(L) 8.1(L)  Hematocrit 39.0 - 52.0 % 45.5 22.3(L) 25.2(L)  Platelets 150 - 400 K/uL 246 213 242      CMP      Component Value Date/Time   NA 140 08/16/2020 2351   NA 135 (L) 11/17/2013 0621   K 4.0 08/16/2020 2351   K 4.3 11/17/2013 0621   CL 105 08/16/2020 2351   CL 99 11/17/2013 0621   CO2 26 08/16/2020 2351   CO2 31 11/17/2013 0621   GLUCOSE 146 (H) 08/16/2020 2351   GLUCOSE 172 (H) 11/17/2013 0621   BUN 11 08/16/2020 2351   BUN 10 11/17/2013 0621   CREATININE  1.05 08/16/2020 2351   CREATININE 0.85 11/17/2013 0621   CALCIUM 9.7 08/16/2020 2351   CALCIUM 9.6 11/17/2013 0621   PROT 6.9 09/30/2018 0337   PROT 8.5 (H) 01/10/2013 2155   ALBUMIN 3.3 (L) 09/30/2018 0337   ALBUMIN 4.0 01/10/2013 2155   AST 19 09/30/2018 0337   AST 16 01/10/2013 2155   ALT 17 09/30/2018 0337   ALT 26 01/10/2013 2155   ALKPHOS 81 09/30/2018 0337   ALKPHOS 91 01/10/2013 2155   BILITOT 0.9 09/30/2018 0337   BILITOT 0.3 01/10/2013 2155   GFRNONAA >60 08/16/2020 2351   GFRNONAA >60 11/17/2013 0621   GFRAA >60 01/10/2020 0808   GFRAA >60 11/17/2013 0621     No results found.     Assessment & Plan:   1. Atherosclerosis of native arteries of the extremities with ulceration ([place])  Recommend:  The patient has worsening diabetic ulcer as well as worsening of the previous blister on the great toe of the right lower extremity.  Also the patient has developed osteomyelitis which also increases the necessity for angiogram to measure the best possible perfusion for wound healing.  The patient has evidence of severe atherosclerotic changes of both lower extremities associated with ulceration and tissue loss of the foot.  This represents a limb threatening ischemia and places the patient at the risk for limb loss.  Patient should undergo angiography of the lower extremities with the hope for intervention for limb salvage.  The risks and benefits as well as the alternative therapies was discussed in detail with the patient.  All questions were answered.  Patient agrees to proceed with angiography.  The patient  will follow up with me in the office after the procedure.    2. Type 2 diabetes mellitus without complication, without long-term current use of insulin (HCC) Continue hypoglycemic medications as already ordered, these medications have been reviewed and there are no changes at this time.  Hgb A1C to be monitored as already arranged by primary service   3. Essential hypertension Continue antihypertensive medications as already ordered, these medications have been reviewed and there are no changes at this time.   4. Mixed hyperlipidemia Continue statin as ordered and reviewed, no changes at this time    Current Outpatient Medications on File Prior to Visit  Medication Sig Dispense Refill  . ACCU-CHEK GUIDE test strip     . amoxicillin-clavulanate (AUGMENTIN) 875-125 MG tablet Take by mouth.    Marland Kitchen apixaban (ELIQUIS) 5 MG TABS tablet Take 1 tablet (5 mg total) by mouth 2 (two) times daily. 60 tablet O  . aspirin EC 81 MG tablet Take 1 tablet (81 mg total) by mouth daily. 150 tablet 2  . atorvastatin (LIPITOR) 80 MG tablet Take 1 tablet (80 mg total) by mouth daily at 6 PM. 30 tablet 0  . Blood Glucose Monitoring Suppl (ACCU-CHEK GUIDE) w/Device KIT USE 1 KIT AS DIRECTED FOUR TIMES A DAY    . Docusate Sodium (DSS) 100 MG CAPS Take by mouth.    . empagliflozin (JARDIANCE) 10 MG TABS tablet     . HUMALOG KWIKPEN 100 UNIT/ML KwikPen 24 Units 3 (three) times daily.    . insulin aspart (NOVOLOG) 100 UNIT/ML injection Inject 10 Units into the skin 3 (three) times daily with meals. 10 mL 0  . insulin glargine (LANTUS) 100 UNIT/ML injection Inject 0.3 mLs (30 Units total) into the skin at bedtime. 10 mL 11  . losartan (COZAAR) 25 MG tablet     .  mupirocin ointment (BACTROBAN) 2 % APPLY TOPICALLY TWO (2) TIMES A DAY FOR 7 DAYS. TO WOUND ON LEG, UNTIL HEALED    . nystatin-triamcinolone ointment (MYCOLOG) APPLY TO AFFECTED AREA TWICE A DAY 30 g 0  . polyethylene glycol (MIRALAX) packet Take 17 g by  mouth daily. 14 each 0  . TRUEPLUS 5-BEVEL PEN NEEDLES 31G X 6 MM MISC      No current facility-administered medications on file prior to visit.    There are no Patient Instructions on file for this visit. No follow-ups on file.   Austin Hartmann, NP

## 2020-12-05 ENCOUNTER — Telehealth (INDEPENDENT_AMBULATORY_CARE_PROVIDER_SITE_OTHER): Payer: Self-pay

## 2020-12-05 NOTE — Telephone Encounter (Signed)
Spoke with the patient and he is scheduled with Dr. Wyn Quaker for a RLE angio on 12/11/20 with a 7:30 am arrival time to the [facility]. Covid testing on 12/07/20 between 8-1 pm at the [facility]. Pre-procedure instructions were discussed and will be mailed.

## 2020-12-07 ENCOUNTER — Other Ambulatory Visit: Payer: Self-pay

## 2020-12-07 ENCOUNTER — Other Ambulatory Visit
Admission: RE | Admit: 2020-12-07 | Discharge: 2020-12-07 | Disposition: A | Discharge status: Home / Self Care | Payer: Medicare Other | Source: Ambulatory Visit | Attending: Vascular Surgery | Admitting: Vascular Surgery

## 2020-12-07 DIAGNOSIS — Z20822 Contact with and (suspected) exposure to covid-19: Secondary | ICD-10-CM | POA: Diagnosis not present

## 2020-12-07 DIAGNOSIS — Z01812 Encounter for preprocedural laboratory examination: Secondary | ICD-10-CM | POA: Insufficient documentation

## 2020-12-07 LAB — SARS CORONAVIRUS 2 (TAT 6-24 HRS): SARS Coronavirus 2: NEGATIVE

## 2020-12-10 ENCOUNTER — Other Ambulatory Visit (INDEPENDENT_AMBULATORY_CARE_PROVIDER_SITE_OTHER): Payer: Self-pay | Admitting: Nurse Practitioner

## 2020-12-11 ENCOUNTER — Ambulatory Visit
Admission: RE | Admit: 2020-12-11 | Discharge: 2020-12-11 | Disposition: A | Discharge status: Home / Self Care | Payer: Medicare Other | Attending: Vascular Surgery | Admitting: Vascular Surgery

## 2020-12-11 ENCOUNTER — Encounter
Admission: RE | Disposition: A | Discharge status: Home / Self Care | Payer: Self-pay | Source: Home / Self Care | Attending: Vascular Surgery

## 2020-12-11 ENCOUNTER — Encounter: Payer: Self-pay | Admitting: Vascular Surgery

## 2020-12-11 ENCOUNTER — Other Ambulatory Visit: Payer: Self-pay

## 2020-12-11 DIAGNOSIS — I10 Essential (primary) hypertension: Secondary | ICD-10-CM | POA: Diagnosis not present

## 2020-12-11 DIAGNOSIS — Z79899 Other long term (current) drug therapy: Secondary | ICD-10-CM | POA: Insufficient documentation

## 2020-12-11 DIAGNOSIS — E1151 Type 2 diabetes mellitus with diabetic peripheral angiopathy without gangrene: Secondary | ICD-10-CM | POA: Diagnosis not present

## 2020-12-11 DIAGNOSIS — I70299 Other atherosclerosis of native arteries of extremities, unspecified extremity: Secondary | ICD-10-CM

## 2020-12-11 DIAGNOSIS — Z794 Long term (current) use of insulin: Secondary | ICD-10-CM | POA: Insufficient documentation

## 2020-12-11 DIAGNOSIS — I70201 Unspecified atherosclerosis of native arteries of extremities, right leg: Secondary | ICD-10-CM | POA: Diagnosis not present

## 2020-12-11 DIAGNOSIS — L97419 Non-pressure chronic ulcer of right heel and midfoot with unspecified severity: Secondary | ICD-10-CM | POA: Diagnosis not present

## 2020-12-11 DIAGNOSIS — I70234 Atherosclerosis of native arteries of right leg with ulceration of heel and midfoot: Secondary | ICD-10-CM | POA: Diagnosis not present

## 2020-12-11 DIAGNOSIS — E782 Mixed hyperlipidemia: Secondary | ICD-10-CM | POA: Insufficient documentation

## 2020-12-11 DIAGNOSIS — Z7982 Long term (current) use of aspirin: Secondary | ICD-10-CM | POA: Diagnosis not present

## 2020-12-11 DIAGNOSIS — Z87891 Personal history of nicotine dependence: Secondary | ICD-10-CM | POA: Insufficient documentation

## 2020-12-11 DIAGNOSIS — E11621 Type 2 diabetes mellitus with foot ulcer: Secondary | ICD-10-CM | POA: Insufficient documentation

## 2020-12-11 DIAGNOSIS — Z7901 Long term (current) use of anticoagulants: Secondary | ICD-10-CM | POA: Insufficient documentation

## 2020-12-11 HISTORY — PX: LOWER EXTREMITY ANGIOGRAPHY: CATH118251

## 2020-12-11 LAB — GLUCOSE, CAPILLARY
Glucose-Capillary: 183 mg/dL — ABNORMAL HIGH (ref 70–99)
Glucose-Capillary: 163 mg/dL — ABNORMAL HIGH (ref 70–99)

## 2020-12-11 SURGERY — LOWER EXTREMITY ANGIOGRAPHY
Anesthesia: Moderate Sedation | Site: Leg Lower | Laterality: Right

## 2020-12-11 MED ORDER — FENTANYL CITRATE (PF) 100 MCG/2ML IJ SOLN
INTRAMUSCULAR | Status: DC | PRN
  Administered 2020-12-11: 25 ug via INTRAVENOUS

## 2020-12-11 MED ORDER — HEPARIN SODIUM (PORCINE) 1000 UNIT/ML IJ SOLN
INTRAMUSCULAR | Status: AC
  Filled 2020-12-11: qty 1

## 2020-12-11 MED ORDER — SODIUM CHLORIDE 0.9 % IV SOLN: INTRAVENOUS | Status: DC

## 2020-12-11 MED ORDER — SODIUM CHLORIDE 0.9% FLUSH: 3.0000 mL | INTRAVENOUS | Status: DC | PRN

## 2020-12-11 MED ORDER — SODIUM CHLORIDE 0.9 % IV SOLN: 250.0000 mL | INTRAVENOUS | Status: DC | PRN

## 2020-12-11 MED ORDER — HEPARIN SODIUM (PORCINE) 1000 UNIT/ML IJ SOLN
INTRAMUSCULAR | Status: DC | PRN
  Administered 2020-12-11: 5000 [IU] via INTRAVENOUS

## 2020-12-11 MED ORDER — CEFAZOLIN SODIUM-DEXTROSE 2-4 GM/100ML-% IV SOLN
INTRAVENOUS | Status: AC
  Filled 2020-12-11: qty 100

## 2020-12-11 MED ORDER — SODIUM CHLORIDE 0.9% FLUSH: 3.0000 mL | Freq: Two times a day (BID) | INTRAVENOUS | Status: DC

## 2020-12-11 MED ORDER — HYDRALAZINE HCL 20 MG/ML IJ SOLN: 5.0000 mg | INTRAMUSCULAR | Status: DC | PRN

## 2020-12-11 MED ORDER — METHYLPREDNISOLONE SODIUM SUCC 125 MG IJ SOLR: 125.0000 mg | Freq: Once | INTRAMUSCULAR | Status: DC | PRN

## 2020-12-11 MED ORDER — ONDANSETRON HCL 4 MG/2ML IJ SOLN: 4.0000 mg | Freq: Four times a day (QID) | INTRAMUSCULAR | Status: DC | PRN

## 2020-12-11 MED ORDER — MIDAZOLAM HCL 2 MG/2ML IJ SOLN
INTRAMUSCULAR | Status: DC | PRN
  Administered 2020-12-11: 1 mg via INTRAVENOUS

## 2020-12-11 MED ORDER — DIPHENHYDRAMINE HCL 50 MG/ML IJ SOLN: 50.0000 mg | Freq: Once | INTRAMUSCULAR | Status: DC | PRN

## 2020-12-11 MED ORDER — FAMOTIDINE 20 MG PO TABS: 40.0000 mg | ORAL_TABLET | Freq: Once | ORAL | Status: DC | PRN

## 2020-12-11 MED ORDER — CEFAZOLIN SODIUM-DEXTROSE 2-4 GM/100ML-% IV SOLN
2.0000 g | Freq: Once | INTRAVENOUS | Status: AC
  Administered 2020-12-11: 2 g via INTRAVENOUS

## 2020-12-11 MED ORDER — FENTANYL CITRATE (PF) 100 MCG/2ML IJ SOLN
INTRAMUSCULAR | Status: AC
  Administered 2020-12-11: 50 ug
  Filled 2020-12-11: qty 2

## 2020-12-11 MED ORDER — ACETAMINOPHEN 325 MG PO TABS: 650.0000 mg | ORAL_TABLET | ORAL | Status: DC | PRN

## 2020-12-11 MED ORDER — MIDAZOLAM HCL 2 MG/ML PO SYRP: 8.0000 mg | ORAL_SOLUTION | Freq: Once | ORAL | Status: DC | PRN

## 2020-12-11 MED ORDER — LABETALOL HCL 5 MG/ML IV SOLN: 10.0000 mg | INTRAVENOUS | Status: DC | PRN

## 2020-12-11 MED ORDER — HYDROMORPHONE HCL 1 MG/ML IJ SOLN: 1.0000 mg | Freq: Once | INTRAMUSCULAR | Status: DC | PRN

## 2020-12-11 MED ORDER — MIDAZOLAM HCL 5 MG/5ML IJ SOLN
INTRAMUSCULAR | Status: AC
  Administered 2020-12-11: 2 mg
  Filled 2020-12-11: qty 5

## 2020-12-11 SURGICAL SUPPLY — 28 items
BALLN LUTONIX 018 4X80X130 (BALLOONS) ×2
BALLN LUTONIX DCB 6X60X130 (BALLOONS) ×2
BALLN LUTONIX DCB 7X60X130 (BALLOONS) ×2
BALLN ULTRVRSE 3X150X150 (BALLOONS) ×2
BALLOON LUTONIX 018 4X80X130 (BALLOONS) ×1 IMPLANT
BALLOON LUTONIX DCB 6X60X130 (BALLOONS) ×1 IMPLANT
BALLOON LUTONIX DCB 7X60X130 (BALLOONS) ×1 IMPLANT
BALLOON ULTRVRSE 3X150X150 (BALLOONS) ×1 IMPLANT
CATH ANGIO 5F PIGTAIL 100CM (CATHETERS) ×2 IMPLANT
CATH ANGIO 5F PIGTAIL 65CM (CATHETERS) ×2 IMPLANT
CATH BEACON 5 .038 100 VERT TP (CATHETERS) ×2 IMPLANT
DEVICE STARCLOSE SE CLOSURE (Vascular Products) ×2 IMPLANT
DRAPE BRACHIAL (DRAPES) ×2 IMPLANT
GLIDEWIRE ADV .035X260CM (WIRE) ×2 IMPLANT
GOWN SRG XL 47XLVL 3 (GOWN DISPOSABLE) ×1 IMPLANT
GOWN STRL NON-REIN TWL XL LVL3 (GOWN DISPOSABLE) ×1
GUIDEWIRE SUPER STIFF .035X180 (WIRE) ×2 IMPLANT
KIT ENCORE 26 ADVANTAGE (KITS) ×2 IMPLANT
PACK ANGIOGRAPHY (CUSTOM PROCEDURE TRAY) ×2 IMPLANT
SHEATH ANL2 6FRX45 HC (SHEATH) ×2 IMPLANT
SHEATH BRITE TIP 4FRX11 (SHEATH) ×2 IMPLANT
SHEATH BRITE TIP 5FRX11 (SHEATH) ×2 IMPLANT
STENT LIFESTAR 8X60X80 (Permanent Stent) ×2 IMPLANT
SYR MEDRAD MARK 7 150ML (SYRINGE) ×2 IMPLANT
TOWEL OR 17X26 4PK STRL BLUE (TOWEL DISPOSABLE) ×2 IMPLANT
TUBING CONTRAST HIGH PRESS 72 (TUBING) ×4 IMPLANT
WIRE G V18X300CM (WIRE) ×2 IMPLANT
WIRE GUIDERIGHT .035X150 (WIRE) ×2 IMPLANT

## 2020-12-11 NOTE — Interval H&P Note (Signed)
History and Physical Interval Note:  12/11/2020 8:48 AM  Austin [name]  has presented today for surgery, with the diagnosis of RT lower extremity angio   BARD   ASO w ulceration Covid  March 3.  The various methods of treatment have been discussed with the patient and family. After consideration of risks, benefits and other options for treatment, the patient has consented to  Procedure(s): LOWER EXTREMITY ANGIOGRAPHY (Right) as a surgical intervention.  [name] history has been reviewed, patient examined, no change in status, stable for surgery.  I have reviewed the patient's chart and labs.  Questions were answered to the patient's satisfaction.     Festus Barren

## 2020-12-11 NOTE — Op Note (Signed)
[facility]  Percutaneous Study/Intervention Procedural Note   Date of Surgery: 12/11/2020  Surgeon(s):Ivery Michalski    Assistants:none  Pre-operative Diagnosis: PAD with ulceration right lower extremity  Post-operative diagnosis:  Same  Procedure(s) Performed:             1.  Ultrasound guidance for vascular access left femoral artery             2.  Catheter placement into right common femoral artery from left femoral approach             3.  Aortogram and selective right lower extremity angiogram             4.  Percutaneous transluminal angioplasty of the right peroneal artery and tibioperoneal trunk with 3 mm diameter angioplasty balloon and then the tibioperoneal trunk with 4 mm diameter [name] drug-coated angioplasty balloon             5.   Percutaneous transluminal angioplasty of left external iliac artery with 6 mm diameter [name] drug-coated angioplasty balloon  6.  Stent placement to left external iliac artery with 8 mm diameter by 6 cm length life star stent             7.  StarClose closure device left femoral artery  EBL: 10 cc  Contrast: 55 cc  Fluoro Time: 5.2 minutes  Moderate Conscious Sedation Time: approximately 51 minutes using 3 mg of Versed and 75 mcg of Fentanyl              Indications:  Patient is a 65 y.o.male with a long history of severe peripheral arterial disease and multiple bilateral interventions and ultimately an amputation on the left leg.  He has a right foot ulceration. The patient is brought in for angiography for further evaluation and potential treatment.  Due to the limb threatening nature of the situation, angiogram was performed for attempted limb salvage. The patient is aware that if the procedure fails, amputation would be expected.  The patient also understands that even with successful revascularization, amputation may still be required due to the severity of the situation.  Risks and benefits are discussed and  informed consent is obtained.   Procedure:  The patient was identified and appropriate procedural time out was performed.  The patient was then placed supine on the table and prepped and draped in the usual sterile fashion. Moderate conscious sedation was administered during a face to face encounter with the patient throughout the procedure with my supervision of the RN administering medicines and monitoring the patient's vital signs, pulse oximetry, telemetry and mental status throughout from the start of the procedure until the patient was taken to the recovery room. Ultrasound was used to evaluate the left common femoral artery.  It was patent but diseased.  A digital ultrasound image was acquired.  A Seldinger needle was used to access the left common femoral artery under direct ultrasound guidance and a permanent image was performed.  A 0.035 J wire was advanced without resistance and a 5Fr sheath was placed after exchanging for a stiff wire and dilating the tract with a 4 French dilator due to the multiple interventions and severe scar tissue.  Pigtail catheter was placed into the aorta and an AP aortogram was performed. This demonstrated normal renal arteries.  2 right iliac stents remain patent without flow-limiting stenosis on the right side.  The left common iliac stent was patent, but there was a high-grade stenosis of the  left external iliac artery of greater than 80%. I then crossed the aortic bifurcation and advanced to the right femoral head. Selective right lower extremity angiogram was then performed. This demonstrated the common femoral artery and proximal superficial femoral artery stent to be widely patent.  There were 2 areas of mild disease in the SFA 1 in the midsegment 1 in the distal segment these do not appear to be more than 30%.  The popliteal artery is patent.  There was a typical tibial trifurcation with mild disease the proximal anterior tibial artery which was then continuous to the  foot.  The tibioperoneal trunk was heavily diseased with a greater than 80% stenosis in both the peroneal and posterior tibial arteries had flow distally below this high-grade stenosis. It was felt that it was in the patient's best interest to proceed with intervention after these images to avoid a second procedure and a larger amount of contrast and fluoroscopy based off of the findings from the initial angiogram. The patient was systemically heparinized and a 6 [place] Ansell sheath was then placed over the [name] wire. I then used a Kumpe catheter and the advantage wire to navigate down into the tibioperoneal trunk and then crossed the high-grade stenosis with a V 18 wire and the Kumpe catheter parking the wire in the distal peroneal artery.  I then treated the proximal peroneal artery and tibioperoneal trunk with a 3 mm diameter angioplasty balloon inflated to 6 atm for 1 minute.  There remained greater than 50% residual disease in the tibioperoneal trunk so upsized to a 4 mm diameter by 8 cm length [name] drug-coated angioplasty balloon inflated this to 6 atm for 1 minute.  Following this there is about a 20% residual stenosis in the tibioperoneal trunk that was markedly improved.  I then pulled the sheath back to the ipsilateral external iliac artery and address the left external iliac artery stenosis.  A 6 mm diameter by 6 cm length [name] drug-coated angioplasty balloon was inflated to 8 atm for 1 minute but there remained high-grade residual stenosis of greater than 60% after angioplasty so I placed an 8 mm diameter by 6 cm length life star stent postdilated with a 7 mm [name] drug-coated balloon with less than 10% residual stenosis after stent placement. I elected to terminate the procedure. The sheath was removed and StarClose closure device was deployed in the left femoral artery with excellent hemostatic result. The patient was taken to the recovery room in stable condition having tolerated  the procedure well.  Findings:               Aortogram:  Renal arteries are patent.  2 right iliac stents remain patent without flow-limiting stenosis on the right side.  The left common iliac stent was patent, but there was a high-grade stenosis of the left external iliac artery of greater than 80%.             Right lower Extremity:  This demonstrated the common femoral artery and proximal superficial femoral artery stent to be widely patent.  There were 2 areas of mild disease in the SFA 1 in the midsegment 1 in the distal segment these do not appear to be more than 30%.  The popliteal artery is patent.  There was a typical tibial trifurcation with mild disease the proximal anterior tibial artery which was then continuous to the foot.  The tibioperoneal trunk was heavily diseased with a greater than 80% stenosis in both the  peroneal and posterior tibial arteries had flow distally below this high-grade stenosis   Disposition: Patient was taken to the recovery room in stable condition having tolerated the procedure well.  Complications: None  Leotis Pain 12/11/2020 9:49 AM   This note was created with Dragon Medical transcription system. Any errors in dictation are purely unintentional.

## 2020-12-14 ENCOUNTER — Other Ambulatory Visit: Payer: Self-pay | Admitting: Physician Assistant

## 2020-12-14 ENCOUNTER — Other Ambulatory Visit (HOSPITAL_COMMUNITY): Payer: Self-pay | Admitting: Physician Assistant

## 2020-12-14 DIAGNOSIS — Z87891 Personal history of nicotine dependence: Secondary | ICD-10-CM

## 2020-12-18 NOTE — H&P (View-Only) (Signed)
12/19/2020 12:16 PM   Austin [name] 10-12-1955 [id number]  Referring provider: Theotis Burrow, MD [address] [place],  [place] [address]  Chief Complaint  Patient presents with  . 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 Nocturia - managed with behavioral and dietary changes  HPI: Austin [name] is a 65 y.o. male who presents today for recheck on his foreskin and nocturia.   He states he has noticed no improvement with his foreskin issues with the use of the Mycolog cream.  He states he has been using the Mycolog cream as directed washing the penis and the foreskin drying it and applying the Mycolog cream twice daily.  He continues to have frequency, urgency, painful urination, incontinence, difficulty urinary and a weak urinary stream.  He attributes much of this to his diabetes.    Patient denies any modifying or aggravating factors.  Patient denies any gross hematuria, dysuria or suprapubic/flank pain.  Patient denies any fevers, chills, nausea or vomiting.   PVR is 82 mL.   PMH: Past Medical History:  Diagnosis Date  . Diabetes ([place])   . DVT (deep venous thrombosis) ([place])   . Hyperlipidemia   . Hypertension   . Peripheral vascular disease [facility])     Surgical History: Past Surgical History:  Procedure Laterality Date  . AMPUTATION Left 10/28/2018   Procedure: AMPUTATION BELOW KNEE;  Surgeon: Algernon Huxley, MD;  Location: [facility];  Service: Vascular;  Laterality: Left;  . AMPUTATION TOE Left 02/10/2018   Procedure: AMPUTATION TOE;  Surgeon: Sharlotte Alamo, DPM;  Location: [facility];  Service: Podiatry;  Laterality: Left;  . APPLICATION OF WOUND VAC Left 10/16/2018   Procedure: APPLICATION OF WOUND VAC;  Surgeon: Samara Deist, DPM;  Location: [facility];  Service: Podiatry;  Laterality: Left;  . groin surgery    . IRRIGATION AND DEBRIDEMENT FOOT Left 09/30/2018   Procedure: IRRIGATION AND DEBRIDEMENT  FOOT;  Surgeon: Sharlotte Alamo, DPM;  Location: [facility];  Service: Podiatry;  Laterality: Left;  . LOWER EXTREMITY ANGIOGRAPHY Left 02/12/2018   Procedure: Lower Extremity Angiography;  Surgeon: Algernon Huxley, MD;  Location: [place] [facility];  Service: Cardiovascular;  Laterality: Left;  . LOWER EXTREMITY ANGIOGRAPHY Left 10/01/2018   Procedure: Lower Extremity Angiography;  Surgeon: Algernon Huxley, MD;  Location: [place] [facility];  Service: Cardiovascular;  Laterality: Left;  . LOWER EXTREMITY ANGIOGRAPHY Left 10/19/2018   Procedure: Lower Extremity Angiography;  Surgeon: Algernon Huxley, MD;  Location: [place] [facility];  Service: Cardiovascular;  Laterality: Left;  . LOWER EXTREMITY ANGIOGRAPHY Left 10/20/2018   Procedure: LOWER EXTREMITY ANGIOGRAPHY;  Surgeon: Algernon Huxley, MD;  Location: [place] [facility];  Service: Cardiovascular;  Laterality: Left;  . LOWER EXTREMITY ANGIOGRAPHY Right 11/25/2018   Procedure: LOWER EXTREMITY ANGIOGRAPHY;  Surgeon: Algernon Huxley, MD;  Location: [place] [facility];  Service: Cardiovascular;  Laterality: Right;  . LOWER EXTREMITY ANGIOGRAPHY Right 01/10/2020   Procedure: LOWER EXTREMITY ANGIOGRAPHY;  Surgeon: Algernon Huxley, MD;  Location: [place] [facility];  Service: Cardiovascular;  Laterality: Right;  . LOWER EXTREMITY ANGIOGRAPHY Right 12/11/2020   Procedure: LOWER EXTREMITY ANGIOGRAPHY;  Surgeon: Algernon Huxley, MD;  Location: [place] [facility];  Service: Cardiovascular;  Laterality: Right;  . LOWER EXTREMITY INTERVENTION  02/12/2018   Procedure: LOWER EXTREMITY INTERVENTION;  Surgeon: Algernon Huxley, MD;  Location: [place] [facility];  Service:  Cardiovascular;;  . NECK SURGERY    . TRANSMETATARSAL AMPUTATION Left 10/16/2018   Procedure: TRANSMETATARSAL AMPUTATION LEFT FOOT;  Surgeon: Samara Deist, DPM;  Location: [facility];  Service: Podiatry;  Laterality: Left;    Home Medications:  Allergies as of 12/19/2020   No Known Allergies     Medication List        Accurate as of December 19, 2020 11:59 PM. If you have any questions, ask your nurse or doctor.        Accu-Chek Guide test strip Generic drug: glucose blood   Accu-Chek Guide w/Device Kit USE 1 KIT AS DIRECTED FOUR TIMES A DAY   amoxicillin-clavulanate 875-125 MG tablet Commonly known as: AUGMENTIN Take by mouth.   apixaban 5 MG Tabs tablet Commonly known as: ELIQUIS Take 1 tablet (5 mg total) by mouth 2 (two) times daily.   aspirin EC 81 MG tablet Take 1 tablet (81 mg total) by mouth daily.   atorvastatin 80 MG tablet Commonly known as: LIPITOR Take 1 tablet (80 mg total) by mouth daily at 6 PM.   DSS 100 MG Caps Take by mouth.   empagliflozin 10 MG Tabs tablet Commonly known as: JARDIANCE   fluconazole 100 MG tablet Commonly known as: DIFLUCAN Take 1 tablet (100 mg total) by mouth daily. X 7 days Started by: Refujio Haymer, PA-C   HumaLOG KwikPen 100 UNIT/ML KwikPen Generic drug: insulin lispro 24 Units 3 (three) times daily.   insulin aspart 100 UNIT/ML injection Commonly known as: novoLOG Inject 10 Units into the skin 3 (three) times daily with meals.   insulin glargine 100 UNIT/ML injection Commonly known as: LANTUS Inject 0.3 mLs (30 Units total) into the skin at bedtime.   losartan 25 MG tablet Commonly known as: COZAAR   mupirocin ointment 2 % Commonly known as: BACTROBAN APPLY TOPICALLY TWO (2) TIMES A DAY FOR 7 DAYS. TO WOUND ON LEG, UNTIL HEALED   nystatin-triamcinolone ointment Commonly known as: MYCOLOG APPLY TO AFFECTED AREA TWICE A DAY   polyethylene glycol 17 g packet Commonly known as: MiraLax Take 17 g by mouth daily.   TRUEplus 5-Bevel Pen Needles 31G X 6 MM Misc Generic drug: Insulin Pen Needle       Allergies: No Known Allergies  Family History: Family History  Problem Relation Age of Onset  . Diabetes Mother   . Coronary artery disease Father   . Hypertension Sister   . Leukemia Brother     Social History:   reports that he quit smoking about 2 years ago. His smoking use included cigarettes. He has a 30.00 pack-year smoking history. He has never used smokeless tobacco. He reports that he does not drink alcohol and does not use drugs.  ROS: Pertinent ROS in HPI  Physical Exam: BP 121/72   Pulse 92   Ht '6\' 4"'  (1.93 m)   Wt 245 lb (111.1 kg)   BMI 29.82 kg/m   Constitutional:  Well nourished. Alert and oriented, No acute distress. HEENT: [place] AT, mask in place.  Trachea midline Cardiovascular: No clubbing, cyanosis, or edema. Respiratory: Normal respiratory effort, no increased work of breathing. GU: No CVA tenderness.  No bladder fullness or masses.  Patient with uncircumcised phallus. Foreskin not able to be fully  Retracted.   Glans of penis is erythemic and foreskin is tight with cracks that are open and bleeding.  Urethral meatus is patent.  No penile discharge.  Scrotum without lesions, cysts, rashes and/or edema.   Neurologic: Grossly  intact, no focal deficits, moving all 4 extremities. Psychiatric: Normal mood and affect.  Laboratory Data: No new labs since last visit  Pertinent Imaging: Results for GUNNISON, CHAHAL (MRN [id number]) as of 12/20/2020 12:14  Ref. Range 12/19/2020 13:24  Scan Result Unknown 82    Assessment & Plan:    1. Balanoposthitis -Prescribe econazole 100 mg daily for 7 days -We will schedule a dorsal slit procedure for the patient in the OR -Will need cardiac clearance prior to procedure as he is on Eliquis and aspirin -Described to the patient that incision will be made in his foreskin that will allow it to open up over the head of his penis.  The risks involved were pain, penile swelling, infection and bleeding.   2. Nocturia -Will address again once he is recovered from dorsal slit procedure  Return for Dorsal slit procedure procedure with Dr. Diamantina Providence in the OR.  These notes generated with voice recognition software. I apologize for typographical  errors.  Zara Council, PA-C  [facility] [address]  [place], [place] [address] [phone]

## 2020-12-18 NOTE — Progress Notes (Signed)
12/19/2020 12:16 PM   Austin [name] 1956-08-27 [id number]  Referring provider: Theotis Burrow, MD [address] [place],  [place] [address]  Chief Complaint  Patient presents with  . 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 Nocturia - managed with behavioral and dietary changes  HPI: Austin [name] is a 65 y.o. male who presents today for recheck on his foreskin and nocturia.   He states he has noticed no improvement with his foreskin issues with the use of the Mycolog cream.  He states he has been using the Mycolog cream as directed washing the penis and the foreskin drying it and applying the Mycolog cream twice daily.  He continues to have frequency, urgency, painful urination, incontinence, difficulty urinary and a weak urinary stream.  He attributes much of this to his diabetes.    Patient denies any modifying or aggravating factors.  Patient denies any gross hematuria, dysuria or suprapubic/flank pain.  Patient denies any fevers, chills, nausea or vomiting.   PVR is 82 mL.   PMH: Past Medical History:  Diagnosis Date  . Diabetes ([place])   . DVT (deep venous thrombosis) ([place])   . Hyperlipidemia   . Hypertension   . Peripheral vascular disease [facility])     Surgical History: Past Surgical History:  Procedure Laterality Date  . AMPUTATION Left 10/28/2018   Procedure: AMPUTATION BELOW KNEE;  Surgeon: Algernon Huxley, MD;  Location: [facility];  Service: Vascular;  Laterality: Left;  . AMPUTATION TOE Left 02/10/2018   Procedure: AMPUTATION TOE;  Surgeon: Sharlotte Alamo, DPM;  Location: [facility];  Service: Podiatry;  Laterality: Left;  . APPLICATION OF WOUND VAC Left 10/16/2018   Procedure: APPLICATION OF WOUND VAC;  Surgeon: Samara Deist, DPM;  Location: [facility];  Service: Podiatry;  Laterality: Left;  . groin surgery    . IRRIGATION AND DEBRIDEMENT FOOT Left 09/30/2018   Procedure: IRRIGATION AND DEBRIDEMENT  FOOT;  Surgeon: Sharlotte Alamo, DPM;  Location: [facility];  Service: Podiatry;  Laterality: Left;  . LOWER EXTREMITY ANGIOGRAPHY Left 02/12/2018   Procedure: Lower Extremity Angiography;  Surgeon: Algernon Huxley, MD;  Location: [place] [facility];  Service: Cardiovascular;  Laterality: Left;  . LOWER EXTREMITY ANGIOGRAPHY Left 10/01/2018   Procedure: Lower Extremity Angiography;  Surgeon: Algernon Huxley, MD;  Location: [place] [facility];  Service: Cardiovascular;  Laterality: Left;  . LOWER EXTREMITY ANGIOGRAPHY Left 10/19/2018   Procedure: Lower Extremity Angiography;  Surgeon: Algernon Huxley, MD;  Location: [place] [facility];  Service: Cardiovascular;  Laterality: Left;  . LOWER EXTREMITY ANGIOGRAPHY Left 10/20/2018   Procedure: LOWER EXTREMITY ANGIOGRAPHY;  Surgeon: Algernon Huxley, MD;  Location: [place] [facility];  Service: Cardiovascular;  Laterality: Left;  . LOWER EXTREMITY ANGIOGRAPHY Right 11/25/2018   Procedure: LOWER EXTREMITY ANGIOGRAPHY;  Surgeon: Algernon Huxley, MD;  Location: [place] [facility];  Service: Cardiovascular;  Laterality: Right;  . LOWER EXTREMITY ANGIOGRAPHY Right 01/10/2020   Procedure: LOWER EXTREMITY ANGIOGRAPHY;  Surgeon: Algernon Huxley, MD;  Location: [place] [facility];  Service: Cardiovascular;  Laterality: Right;  . LOWER EXTREMITY ANGIOGRAPHY Right 12/11/2020   Procedure: LOWER EXTREMITY ANGIOGRAPHY;  Surgeon: Algernon Huxley, MD;  Location: [place] [facility];  Service: Cardiovascular;  Laterality: Right;  . LOWER EXTREMITY INTERVENTION  02/12/2018   Procedure: LOWER EXTREMITY INTERVENTION;  Surgeon: Algernon Huxley, MD;  Location: [place] [facility];  Service:  Cardiovascular;;  . NECK SURGERY    . TRANSMETATARSAL AMPUTATION Left 10/16/2018   Procedure: TRANSMETATARSAL AMPUTATION LEFT FOOT;  Surgeon: Samara Deist, DPM;  Location: [facility];  Service: Podiatry;  Laterality: Left;    Home Medications:  Allergies as of 12/19/2020   No Known Allergies     Medication List        Accurate as of December 19, 2020 11:59 PM. If you have any questions, ask your nurse or doctor.        Accu-Chek Guide test strip Generic drug: glucose blood   Accu-Chek Guide w/Device Kit USE 1 KIT AS DIRECTED FOUR TIMES A DAY   amoxicillin-clavulanate 875-125 MG tablet Commonly known as: AUGMENTIN Take by mouth.   apixaban 5 MG Tabs tablet Commonly known as: ELIQUIS Take 1 tablet (5 mg total) by mouth 2 (two) times daily.   aspirin EC 81 MG tablet Take 1 tablet (81 mg total) by mouth daily.   atorvastatin 80 MG tablet Commonly known as: LIPITOR Take 1 tablet (80 mg total) by mouth daily at 6 PM.   DSS 100 MG Caps Take by mouth.   empagliflozin 10 MG Tabs tablet Commonly known as: JARDIANCE   fluconazole 100 MG tablet Commonly known as: DIFLUCAN Take 1 tablet (100 mg total) by mouth daily. X 7 days Started by: Omaira Mellen, PA-C   HumaLOG KwikPen 100 UNIT/ML KwikPen Generic drug: insulin lispro 24 Units 3 (three) times daily.   insulin aspart 100 UNIT/ML injection Commonly known as: novoLOG Inject 10 Units into the skin 3 (three) times daily with meals.   insulin glargine 100 UNIT/ML injection Commonly known as: LANTUS Inject 0.3 mLs (30 Units total) into the skin at bedtime.   losartan 25 MG tablet Commonly known as: COZAAR   mupirocin ointment 2 % Commonly known as: BACTROBAN APPLY TOPICALLY TWO (2) TIMES A DAY FOR 7 DAYS. TO WOUND ON LEG, UNTIL HEALED   nystatin-triamcinolone ointment Commonly known as: MYCOLOG APPLY TO AFFECTED AREA TWICE A DAY   polyethylene glycol 17 g packet Commonly known as: MiraLax Take 17 g by mouth daily.   TRUEplus 5-Bevel Pen Needles 31G X 6 MM Misc Generic drug: Insulin Pen Needle       Allergies: No Known Allergies  Family History: Family History  Problem Relation Age of Onset  . Diabetes Mother   . Coronary artery disease Father   . Hypertension Sister   . Leukemia Brother     Social History:   reports that he quit smoking about 2 years ago. His smoking use included cigarettes. He has a 30.00 pack-year smoking history. He has never used smokeless tobacco. He reports that he does not drink alcohol and does not use drugs.  ROS: Pertinent ROS in HPI  Physical Exam: BP 121/72   Pulse 92   Ht '6\' 4"'  (1.93 m)   Wt 245 lb (111.1 kg)   BMI 29.82 kg/m   Constitutional:  Well nourished. Alert and oriented, No acute distress. HEENT: [place] AT, mask in place.  Trachea midline Cardiovascular: No clubbing, cyanosis, or edema. Respiratory: Normal respiratory effort, no increased work of breathing. GU: No CVA tenderness.  No bladder fullness or masses.  Patient with uncircumcised phallus. Foreskin not able to be fully  Retracted.   Glans of penis is erythemic and foreskin is tight with cracks that are open and bleeding.  Urethral meatus is patent.  No penile discharge.  Scrotum without lesions, cysts, rashes and/or edema.   Neurologic: Grossly  intact, no focal deficits, moving all 4 extremities. Psychiatric: Normal mood and affect.  Laboratory Data: No new labs since last visit  Pertinent Imaging: Results for CHAYNE, BAUMGART (MRN [id number]) as of 12/20/2020 12:14  Ref. Range 12/19/2020 13:24  Scan Result Unknown 82    Assessment & Plan:    1. Balanoposthitis -Prescribe econazole 100 mg daily for 7 days -We will schedule a dorsal slit procedure for the patient in the OR -Will need cardiac clearance prior to procedure as he is on Eliquis and aspirin -Described to the patient that incision will be made in his foreskin that will allow it to open up over the head of his penis.  The risks involved were pain, penile swelling, infection and bleeding.   2. Nocturia -Will address again once he is recovered from dorsal slit procedure  Return for Dorsal slit procedure procedure with Dr. Diamantina Providence in the OR.  These notes generated with voice recognition software. I apologize for typographical  errors.  Zara Council, PA-C  [facility] [address]  [place], [place] [address] [phone]

## 2020-12-19 ENCOUNTER — Other Ambulatory Visit: Payer: Self-pay

## 2020-12-19 ENCOUNTER — Encounter: Payer: Self-pay | Admitting: Urology

## 2020-12-19 ENCOUNTER — Ambulatory Visit (INDEPENDENT_AMBULATORY_CARE_PROVIDER_SITE_OTHER): Payer: Medicare Other | Admitting: Urology

## 2020-12-19 VITALS — BP 121/72 | HR 92 | Ht 76.0 in | Wt 245.0 lb

## 2020-12-19 DIAGNOSIS — N476 Balanoposthitis: Secondary | ICD-10-CM

## 2020-12-19 DIAGNOSIS — R351 Nocturia: Secondary | ICD-10-CM | POA: Diagnosis not present

## 2020-12-19 LAB — BLADDER SCAN AMB NON-IMAGING: Scan Result: 82

## 2020-12-19 MED ORDER — FLUCONAZOLE 100 MG PO TABS: 100.0000 mg | ORAL_TABLET | Freq: Every day | ORAL | 0 refills | Status: DC

## 2020-12-21 ENCOUNTER — Other Ambulatory Visit: Payer: Self-pay | Admitting: Urology

## 2020-12-21 DIAGNOSIS — N471 Phimosis: Secondary | ICD-10-CM

## 2020-12-21 DIAGNOSIS — N476 Balanoposthitis: Secondary | ICD-10-CM

## 2021-01-05 ENCOUNTER — Other Ambulatory Visit
Admission: RE | Admit: 2021-01-05 | Discharge: 2021-01-05 | Disposition: A | Discharge status: Home / Self Care | Payer: Medicare Other | Source: Ambulatory Visit | Attending: Urology | Admitting: Urology

## 2021-01-05 ENCOUNTER — Encounter (INDEPENDENT_AMBULATORY_CARE_PROVIDER_SITE_OTHER): Payer: Self-pay | Admitting: Vascular Surgery

## 2021-01-05 ENCOUNTER — Ambulatory Visit (INDEPENDENT_AMBULATORY_CARE_PROVIDER_SITE_OTHER): Payer: Medicare Other

## 2021-01-05 ENCOUNTER — Other Ambulatory Visit: Payer: Self-pay

## 2021-01-05 ENCOUNTER — Ambulatory Visit (INDEPENDENT_AMBULATORY_CARE_PROVIDER_SITE_OTHER): Payer: Medicare Other | Admitting: Vascular Surgery

## 2021-01-05 VITALS — BP 137/77 | HR 92 | Resp 16

## 2021-01-05 DIAGNOSIS — Z89512 Acquired absence of left leg below knee: Secondary | ICD-10-CM

## 2021-01-05 DIAGNOSIS — I7025 Atherosclerosis of native arteries of other extremities with ulceration: Secondary | ICD-10-CM

## 2021-01-05 DIAGNOSIS — I10 Essential (primary) hypertension: Secondary | ICD-10-CM

## 2021-01-05 DIAGNOSIS — E1151 Type 2 diabetes mellitus with diabetic peripheral angiopathy without gangrene: Secondary | ICD-10-CM | POA: Diagnosis not present

## 2021-01-05 DIAGNOSIS — Z794 Long term (current) use of insulin: Secondary | ICD-10-CM

## 2021-01-05 HISTORY — DX: Gastro-esophageal reflux disease without esophagitis: K21.9

## 2021-01-05 HISTORY — DX: Atherosclerotic heart disease of native coronary artery without angina pectoris: I25.10

## 2021-01-05 NOTE — Patient Instructions (Signed)
Your procedure is scheduled on: Friday January 12, 2021. Report to Day Surgery inside [facility] 2nd floor (stop by admissions desk first before getting on elevator. To find out your arrival time please call [phone] between 1PM - 3PM on Thursday January 11, 2021.  Remember: Instructions that are not followed completely may result in serious medical risk,  up to and including death, or upon the discretion of your surgeon and anesthesiologist your  surgery may need to be rescheduled.     _X__ 1. Do not eat food or drink fluids after midnight the night before your procedure.                 No chewing gum or hard candies.   __X__2.  On the morning of surgery brush your teeth with toothpaste and water, you                may rinse your mouth with mouthwash if you wish.  Do not swallow any toothpaste of mouthwash.     _X__ 3.  No Alcohol for 24 hours before or after surgery.   _X__ 4.  Do Not Smoke or use e-cigarettes For 24 Hours Prior to Your Surgery.                 Do not use any chewable tobacco products for at least 6 hours prior to                 Surgery.  _X__  5.  Do not use any recreational drugs (marijuana, cocaine, heroin, ecstasy, MDMA or other)                For at least one week prior to your surgery.  Combination of these drugs with anesthesia                May have life threatening results.   __X__ 6.  Notify your doctor if there is any change in your medical condition      (cold, fever, infections).     Do not wear jewelry, make-up, hairpins, clips or nail polish. Do not wear lotions, powders, or perfumes. You may wear deodorant. Do not shave 48 hours prior to surgery. Men may shave face and neck. Do not bring valuables to the hospital.    [facility] is not responsible for any belongings or valuables.  Contacts, dentures or bridgework may not be worn into surgery. Leave your suitcase in the car. After surgery it may be brought to your  room. For patients admitted to the hospital, discharge time is determined by your treatment team.   Patients discharged the day of surgery will not be allowed to drive home.   Make arrangements for someone to be with you for the first 24 hours of your Same Day Discharge.   __X__ Take these medicines the morning of surgery with A SIP OF WATER:    1. None   2.   3.   4.  5.  6.  ____ Fleet Enema (as directed)   ____ Use CHG Soap (or wipes) as directed  ____ Use Benzoyl Peroxide Gel as instructed  ____ Use inhalers on the day of surgery  ____ Stop metformin 2 days prior to surgery    __X__ Take 1/2 of usual insulin dose the night before surgery. insulin glargine (LANTUS) 100 UNIT/ML injection        No insulin the morning of surgery.    __X__ Call your cardiologist and  ask when to stop taking your aspirin EC 81 MG and apixaban (ELIQUIS) 5 MG before your surgery.  __X__ Stop Anti-inflammatories such as Ibuprofen, Aleve, Advil, naproxen, and or BC powders.   __X__ Stop supplements until after surgery.    __X__ Do not start any herbal supplements before your procedure.    If you have any questions regarding your pre-procedure instructions,  Please call Pre-admit Testing at [phone].

## 2021-01-05 NOTE — Assessment & Plan Note (Signed)
Now with prosthesis

## 2021-01-05 NOTE — Assessment & Plan Note (Signed)
blood glucose control important in reducing the progression of atherosclerotic disease. Also, involved in wound healing. On appropriate medications.  

## 2021-01-05 NOTE — Assessment & Plan Note (Signed)
His noninvasive studies today show a right ABI of 1.06.  He is status post left leg amputation.  His perfusion is now essentially normal in that right leg, but his wounds continue to progress.  We need to get him back into see his podiatrist in the near future.  No further vascular surgery is necessary at this time but this remains a critical and limb threatening situation.

## 2021-01-05 NOTE — Progress Notes (Signed)
MRN : [id number]  Austin [name] is a 65 y.o. (December 15, 1955) male who presents with chief complaint of  Chief Complaint  Patient presents with  . Follow-up    [facility] 4wk post le angio  .  History of Present Illness: Patient returns today in follow up of his PAD. He needs to have some dark eschar over the right great toe and ulcer on the base of the fifth metatarsal and around the base of the fifth toe.  He does not have a lot of feeling in his foot.  His swelling is mild to moderate.  He underwent revascularization several weeks ago, but his disease was actually not nearly as severe as we had anticipated and the revascularization was limited.  His noninvasive studies today show a right ABI of 1.06.  He is status post left leg amputation.  Current Outpatient Medications  Medication Sig Dispense Refill  . ACCU-CHEK GUIDE test strip     . apixaban (ELIQUIS) 5 MG TABS tablet Take 1 tablet (5 mg total) by mouth 2 (two) times daily. 60 tablet O  . aspirin EC 81 MG tablet Take 1 tablet (81 mg total) by mouth daily. 150 tablet 2  . atorvastatin (LIPITOR) 80 MG tablet Take 1 tablet (80 mg total) by mouth daily at 6 PM. 30 tablet 0  . Blood Glucose Monitoring Suppl (ACCU-CHEK GUIDE) w/Device KIT USE 1 KIT AS DIRECTED FOUR TIMES A DAY    . Docusate Sodium (DSS) 100 MG CAPS Take 100 mg by mouth in the morning and at bedtime.    . empagliflozin (JARDIANCE) 25 MG TABS tablet Take 10 mg by mouth daily.    Marland Kitchen HUMALOG KWIKPEN 100 UNIT/ML KwikPen Inject 24 Units into the skin 3 (three) times daily before meals.    . insulin glargine (LANTUS) 100 UNIT/ML injection Inject 0.3 mLs (30 Units total) into the skin at bedtime. (Patient taking differently: Inject 24 Units into the skin at bedtime.) 10 mL 11  . losartan (COZAAR) 25 MG tablet Take 25 mg by mouth daily.    . TRUEPLUS 5-BEVEL PEN NEEDLES 31G X 6 MM MISC     . fluconazole (DIFLUCAN) 100 MG tablet Take 1 tablet (100 mg total) by mouth daily. X 7 days  (Patient not taking: No sig reported) 7 tablet 0  . insulin aspart (NOVOLOG) 100 UNIT/ML injection Inject 10 Units into the skin 3 (three) times daily with meals. (Patient not taking: No sig reported) 10 mL 0  . nystatin-triamcinolone ointment (MYCOLOG) APPLY TO AFFECTED AREA TWICE A DAY (Patient not taking: No sig reported) 30 g 0  . polyethylene glycol (MIRALAX) packet Take 17 g by mouth daily. (Patient not taking: No sig reported) 14 each 0   No current facility-administered medications for this visit.    Past Medical History:  Diagnosis Date  . Diabetes ([place])   . DVT (deep venous thrombosis) ([place])   . Hyperlipidemia   . Hypertension   . Peripheral vascular disease [facility])     Past Surgical History:  Procedure Laterality Date  . AMPUTATION Left 10/28/2018   Procedure: AMPUTATION BELOW KNEE;  Surgeon: Algernon Huxley, MD;  Location: [facility] ORS;  Service: Vascular;  Laterality: Left;  . AMPUTATION TOE Left 02/10/2018   Procedure: AMPUTATION TOE;  Surgeon: Sharlotte Alamo, DPM;  Location: [facility] ORS;  Service: Podiatry;  Laterality: Left;  . APPLICATION OF WOUND VAC Left 10/16/2018   Procedure: APPLICATION OF WOUND VAC;  Surgeon: Samara Deist, DPM;  Location: [facility] OR[facility];  Service: Podiatry;  Laterality: Left;  . groin surgery    . IRRIGATION AND DEBRIDEMENT FOOT Left 09/30/2018   Procedure: IRRIGATION AND DEBRIDEMENT FOOT;  Surgeon: Sharlotte Alamo, DPM;  Location: [facility] OR[facility];  Service: Podiatry;  Laterality: Left;  . LOWER EXTREMITY ANGIOGRAPHY Left 02/12/2018   Procedure: Lower Extremity Angiography;  Surgeon: Algernon Huxley, MD;  Location: [place] [facility];  Service: Cardiovascular;  Laterality: Left;  . LOWER EXTREMITY ANGIOGRAPHY Left 10/01/2018   Procedure: Lower Extremity Angiography;  Surgeon: Algernon Huxley, MD;  Location: [place] [facility];  Service: Cardiovascular;  Laterality: Left;  . LOWER EXTREMITY ANGIOGRAPHY Left 10/19/2018   Procedure: Lower Extremity Angiography;  Surgeon: Algernon Huxley,  MD;  Location: [place] [facility];  Service: Cardiovascular;  Laterality: Left;  . LOWER EXTREMITY ANGIOGRAPHY Left 10/20/2018   Procedure: LOWER EXTREMITY ANGIOGRAPHY;  Surgeon: Algernon Huxley, MD;  Location: [place] [facility];  Service: Cardiovascular;  Laterality: Left;  . LOWER EXTREMITY ANGIOGRAPHY Right 11/25/2018   Procedure: LOWER EXTREMITY ANGIOGRAPHY;  Surgeon: Algernon Huxley, MD;  Location: [place] [facility];  Service: Cardiovascular;  Laterality: Right;  . LOWER EXTREMITY ANGIOGRAPHY Right 01/10/2020   Procedure: LOWER EXTREMITY ANGIOGRAPHY;  Surgeon: Algernon Huxley, MD;  Location: [place] [facility];  Service: Cardiovascular;  Laterality: Right;  . LOWER EXTREMITY ANGIOGRAPHY Right 12/11/2020   Procedure: LOWER EXTREMITY ANGIOGRAPHY;  Surgeon: Algernon Huxley, MD;  Location: [place] [facility];  Service: Cardiovascular;  Laterality: Right;  . LOWER EXTREMITY INTERVENTION  02/12/2018   Procedure: LOWER EXTREMITY INTERVENTION;  Surgeon: Algernon Huxley, MD;  Location: [place] [facility];  Service: Cardiovascular;;  . NECK SURGERY    . TRANSMETATARSAL AMPUTATION Left 10/16/2018   Procedure: TRANSMETATARSAL AMPUTATION LEFT FOOT;  Surgeon: Samara Deist, DPM;  Location: [facility];  Service: Podiatry;  Laterality: Left;     Social History   Tobacco Use  . Smoking status: Former Smoker    Packs/day: 1.00    Years: 30.00    Pack years: 30.00    Types: Cigarettes    Quit date: 10/07/2018    Years since quitting: 2.2  . Smokeless tobacco: Never Used  Vaping Use  . Vaping Use: Never used  Substance Use Topics  . Alcohol use: No  . Drug use: No      Family History  Problem Relation Age of Onset  . Diabetes Mother   . Coronary artery disease Father   . Hypertension Sister   . Leukemia Brother     No Known Allergies   REVIEW OF SYSTEMS (Negative unless checked)  Constitutional: '[]' Weight loss  '[]' Fever  '[]' Chills Cardiac: '[]' Chest pain   '[]' Chest pressure   '[]' Palpitations    '[]' Shortness of breath when laying flat   '[]' Shortness of breath at rest   '[]' Shortness of breath with exertion. Vascular:  '[]' Pain in legs with walking   '[]' Pain in legs at rest   '[]' Pain in legs when laying flat   '[]' Claudication   '[]' Pain in feet when walking  '[]' Pain in feet at rest  '[]' Pain in feet when laying flat   '[]' History of DVT   '[]' Phlebitis   '[x]' Swelling in legs   '[]' Varicose veins   '[x]' Non-healing ulcers Pulmonary:   '[]' Uses home oxygen   '[]' Productive cough   '[]' Hemoptysis   '[]' Wheeze  '[]' COPD   '[]' Asthma Neurologic:  '[]' Dizziness  '[]' Blackouts   '[]' Seizures   '[]' History of stroke   '[]' History of TIA  '[]'   Aphasia   '[]' Temporary blindness   '[]' Dysphagia   '[]' Weakness or numbness in arms   '[]' Weakness or numbness in legs Musculoskeletal:  '[x]' Arthritis   '[]' Joint swelling   '[x]' Joint pain   '[]' Low back pain Hematologic:  '[]' Easy bruising  '[]' Easy bleeding   '[]' Hypercoagulable state   '[]' Anemic   Gastrointestinal:  '[]' Blood in stool   '[]' Vomiting blood  '[]' Gastroesophageal reflux/heartburn   '[]' Abdominal pain Genitourinary:  '[]' Chronic kidney disease   '[]' Difficult urination  '[]' Frequent urination  '[]' Burning with urination   '[]' Hematuria Skin:  '[]' Rashes   '[x]' Ulcers   '[x]' Wounds Psychological:  '[]' History of anxiety   '[]'  History of major depression.  Physical Examination  BP 137/77 (BP Location: Left Arm)   Pulse 92   Resp 16  Gen:  WD/WN, NAD Head: [place]/AT, No temporalis wasting. Ear/Nose/Throat: Hearing grossly intact, nares w/o erythema or drainage Eyes: Conjunctiva clear. Sclera non-icteric Neck: Supple.  Trachea midline Pulmonary:  Good air movement, no use of accessory muscles.  Cardiac: RRR, no JVD Vascular:  Vessel Right Left  Radial Palpable Palpable                          PT Trace Palpable Not Palpable  DP 1+ Palpable Not Palpable    Musculoskeletal: M/S 5/5 throughout.  In a wheelchair.  Left leg prosthesis is in place.  1-2+ right lower extremity edema. Neurologic: Sensation grossly intact in  extremities.  Symmetrical.  Speech is fluent.  Psychiatric: Judgment intact, Mood & affect appropriate for pt's clinical situation. Dermatologic: He has a circular wound on the base of the fifth metatarsal head on the right foot.  There is some eschar on the fifth toe and around the base of the toe.  There is also dark eschar on the right great toe       Labs Recent Results (from the past 2160 hour(s))  Urinalysis, Complete     Status: Abnormal   Collection Time: 10/12/20 11:10 AM  Result Value Ref Range   Specific Gravity, UA 1.020 1.005 - 1.030   pH, UA 6.5 5.0 - 7.5   Color, UA Yellow Yellow   Appearance Ur Clear Clear   Leukocytes,UA Negative Negative   Protein,UA Negative Negative/Trace   Glucose, UA 3+ (A) Negative   Ketones, UA Negative Negative   RBC, UA Trace (A) Negative   Bilirubin, UA Negative Negative   Urobilinogen, Ur 0.2 0.2 - 1.0 mg/dL   Nitrite, UA Negative Negative   Microscopic Examination See below:   Microscopic Examination     Status: Abnormal   Collection Time: 10/12/20 11:10 AM   Urine  Result Value Ref Range   WBC, UA 0-5 0 - 5 /hpf   RBC 0-2 0 - 2 /hpf   Epithelial Cells (non renal) 0-10 0 - 10 /hpf   Bacteria, UA None seen None seen/Few   Yeast, UA Present (A) None seen  Aerobic/Anaerobic Culture (surgical/deep wound)     Status: None   Collection Time: 11/16/20 10:34 AM   Specimen: Foot; Abscess  Result Value Ref Range   Specimen Description      FOOT RIGHT Performed at [facility], [address] [place], [place] [address]    Special Requests      NONE Performed at [facility], [place], [place] [address]    Gram Stain      RARE WBC PRESENT,BOTH PMN AND MONONUCLEAR NO ORGANISMS SEEN    Culture  RARE STAPHYLOCOCCUS AUREUS NO ANAEROBES ISOLATED Performed at [facility], [place] [address], [place], [place] [address]    Report Status 11/22/2020 FINAL    Organism ID, Bacteria  STAPHYLOCOCCUS AUREUS       Susceptibility   Staphylococcus aureus - MIC*    CIPROFLOXACIN <=0.5 SENSITIVE Sensitive     ERYTHROMYCIN <=0.25 SENSITIVE Sensitive     GENTAMICIN <=0.5 SENSITIVE Sensitive     OXACILLIN 0.5 SENSITIVE Sensitive     TETRACYCLINE <=1 SENSITIVE Sensitive     VANCOMYCIN 1 SENSITIVE Sensitive     TRIMETH/SULFA <=10 SENSITIVE Sensitive     CLINDAMYCIN <=0.25 SENSITIVE Sensitive     RIFAMPIN <=0.5 SENSITIVE Sensitive     Inducible Clindamycin NEGATIVE Sensitive     * RARE STAPHYLOCOCCUS AUREUS  Urinalysis, Complete     Status: Abnormal   Collection Time: 11/20/20  4:11 PM  Result Value Ref Range   Specific Gravity, UA 1.015 1.005 - 1.030   pH, UA 5.0 5.0 - 7.5   Color, UA Yellow Yellow   Appearance Ur Cloudy (A) Clear   Leukocytes,UA Negative Negative   Protein,UA Trace (A)    Glucose, UA 3+ (A) Negative   Ketones, UA Negative Negative   RBC, UA Trace (A) Negative   Bilirubin, UA Negative Negative   Urobilinogen, Ur 0.2 0.2 - 1.0 mg/dL   Nitrite, UA Negative Negative   Microscopic Examination See below:   Microscopic Examination     Status: Abnormal   Collection Time: 11/20/20  4:11 PM   Urine  Result Value Ref Range   WBC, UA 0-5 0 - 5 /hpf   RBC 0-2 0 - 2 /hpf   Epithelial Cells (non renal) 0-10 0 - 10 /hpf   Bacteria, UA None seen None seen/Few   Yeast, UA Present (A) None seen  SARS CORONAVIRUS 2 (TAT 6-24 HRS) Nasopharyngeal Nasopharyngeal Swab     Status: None   Collection Time: 12/07/20 10:36 AM   Specimen: Nasopharyngeal Swab  Result Value Ref Range   SARS Coronavirus 2 NEGATIVE NEGATIVE    Comment: (NOTE) SARS-CoV-2 target nucleic acids are NOT DETECTED.  The SARS-CoV-2 RNA is generally detectable in upper and lower respiratory specimens during the acute phase of infection. Negative results do not preclude SARS-CoV-2 infection, do not rule out co-infections with other pathogens, and should not be used as the sole basis for treatment or  other patient management decisions. Negative results must be combined with clinical observations, patient history, and epidemiological information. The expected result is Negative.  Fact Sheet for Patients: SugarRoll.be  Fact Sheet for Healthcare Providers: https://www.woods-mathews.com/  This test is not yet approved or cleared by the [place] FDA and  has been authorized for detection and/or diagnosis of SARS-CoV-2 by FDA under an Emergency Use Authorization (EUA). This EUA will remain  in effect (meaning this test can be used) for the duration of the COVID-19 declaration under Se ction 564(b)(1) of the Act, 21 U.S.C. section 360bbb-3(b)(1), unless the authorization is terminated or revoked sooner.  Performed at [facility], [place] [address] [place], [place] [address]   Glucose, capillary     Status: Abnormal   Collection Time: 12/11/20  7:28 AM  Result Value Ref Range   Glucose-Capillary 183 (H) 70 - 99 mg/dL    Comment: Glucose reference range applies only to samples taken after fasting for at least 8 hours.  Glucose, capillary     Status: Abnormal   Collection Time: 12/11/20  9:53 AM  Result Value Ref Range   Glucose-Capillary 163 (H) 70 - 99 mg/dL    Comment: Glucose reference range applies only to samples taken after fasting for at least 8 hours.  Bladder Scan (Post Void Residual) in office     Status: None   Collection Time: 12/19/20  1:24 PM  Result Value Ref Range   Scan Result 82     Radiology PERIPHERAL VASCULAR CATHETERIZATION  Result Date: 12/11/2020 See op note  VAS [place] ABI WITH/WO TBI  Result Date: 01/05/2021 LOWER EXTREMITY DOPPLER STUDY Indications: Peripheral artery disease.  Vascular Interventions: H/O mulitple left leg revascularizations with left BKA                         on 10/28/18;                         11/25/18: Right SFA, popliteal & TP trunk PTAs;                          01/10/2020:Aortogram  and Selective Right Lower Extremity                         Angiogram occluding selective Imaging of the Rt Peroneal                         Arterty and Posterior Tibial Artery. PTA of the Right                         Peroneal Artery and Tibioperoneal Trunk. PTA of the                         Right Posterior Tibial Artery and Tibioperoneal Trunk.                         Stent placement to the Right Proximal SFA. new rt sfa                         stent. Comparison Study: 12/01/2020 Performing Technologist: Concha Norway RVT  Examination Guidelines: A complete evaluation includes at minimum, Doppler waveform signals and systolic blood pressure reading at the level of bilateral brachial, anterior tibial, and posterior tibial arteries, when vessel segments are accessible. Bilateral testing is considered an integral part of a complete examination. Photoelectric Plethysmograph (PPG) waveforms and toe systolic pressure readings are included as required and additional duplex testing as needed. Limited examinations for reoccurring indications may be performed as noted.  ABI Findings: +---------+------------------+-----+--------+---------------------+ Right    Rt Pressure (mmHg)IndexWaveformComment               +---------+------------------+-----+--------+---------------------+ Brachial 112                                                  +---------+------------------+-----+--------+---------------------+ ATA      119               1.06                               +---------+------------------+-----+--------+---------------------+  PTA      99                0.88                               +---------+------------------+-----+--------+---------------------+ Great Toe                               bandaged due to wound +---------+------------------+-----+--------+---------------------+ Right ABIs appear increased compared to prior study on 12/01/2020.  Summary: Right: Resting right  ankle-brachial index is within normal range. No evidence of significant right lower extremity arterial disease. Left: BKA.  *See table(s) above for measurements and observations.  Electronically signed by Leotis Pain MD on 01/05/2021 at 12:08:40 PM.   Final     Assessment/Plan  Atherosclerosis of native arteries of the extremities with ulceration ([place]) His noninvasive studies today show a right ABI of 1.06.  He is status post left leg amputation.  His perfusion is now essentially normal in that right leg, but his wounds continue to progress.  We need to get him back into see his podiatrist in the near future.  No further vascular surgery is necessary at this time but this remains a critical and limb threatening situation.  Essential hypertension blood pressure control important in reducing the progression of atherosclerotic disease. On appropriate oral medications.    Diabetes ([place]) blood glucose control important in reducing the progression of atherosclerotic disease. Also, involved in wound healing. On appropriate medications.   Status post below-knee amputation [facility]) Now with prosthesis    Leotis Pain, MD  01/05/2021 12:24 PM    This note was created with Dragon medical transcription system.  Any errors from dictation are purely unintentional

## 2021-01-05 NOTE — Assessment & Plan Note (Signed)
blood pressure control important in reducing the progression of atherosclerotic disease. On appropriate oral medications.  

## 2021-01-10 ENCOUNTER — Inpatient Hospital Stay: Admission: RE | Admit: 2021-01-10 | Payer: Medicare Other | Source: Ambulatory Visit

## 2021-01-10 ENCOUNTER — Other Ambulatory Visit: Payer: Medicare Other

## 2021-01-11 ENCOUNTER — Other Ambulatory Visit: Payer: Self-pay

## 2021-01-11 ENCOUNTER — Other Ambulatory Visit
Admission: RE | Admit: 2021-01-11 | Discharge: 2021-01-11 | Disposition: A | Discharge status: Home / Self Care | Payer: Medicare Other | Source: Ambulatory Visit | Attending: Urology | Admitting: Urology

## 2021-01-11 DIAGNOSIS — Z20822 Contact with and (suspected) exposure to covid-19: Secondary | ICD-10-CM | POA: Diagnosis not present

## 2021-01-11 DIAGNOSIS — Z01812 Encounter for preprocedural laboratory examination: Secondary | ICD-10-CM | POA: Insufficient documentation

## 2021-01-11 LAB — BASIC METABOLIC PANEL
Anion gap: 9 (ref 5–15)
BUN: 17 mg/dL (ref 8–23)
CO2: 23 mmol/L (ref 22–32)
Calcium: 9.3 mg/dL (ref 8.9–10.3)
Chloride: 107 mmol/L (ref 98–111)
Creatinine, Ser: 1.02 mg/dL (ref 0.61–1.24)
GFR, Estimated: >60 mL/min (ref >60)
Glucose, Bld: 156 mg/dL — ABNORMAL HIGH (ref 70–99)
Potassium: 3.8 mmol/L (ref 3.5–5.1)
Sodium: 139 mmol/L (ref 135–145)

## 2021-01-11 LAB — CBC
HCT: 43.2 % (ref 39.0–52.0)
Hemoglobin: 14.2 g/dL (ref 13.0–17.0)
MCH: 26.7 pg (ref 26.0–34.0)
MCHC: 32.9 g/dL (ref 30.0–36.0)
MCV: 81.2 fL (ref 80.0–100.0)
Platelets: 240 10*3/uL (ref 150–400)
RBC: 5.32 MIL/uL (ref 4.22–5.81)
RDW: 16.7 % — ABNORMAL HIGH (ref 11.5–15.5)
WBC: 8.8 10*3/uL (ref 4.0–10.5)
nRBC: 0 % (ref 0.0–0.2)

## 2021-01-12 ENCOUNTER — Other Ambulatory Visit: Payer: Self-pay

## 2021-01-12 ENCOUNTER — Encounter
Admission: RE | Disposition: A | Discharge status: Home / Self Care | Payer: Self-pay | Source: Ambulatory Visit | Attending: Urology

## 2021-01-12 ENCOUNTER — Ambulatory Visit: Payer: Medicare Other | Admitting: Urgent Care

## 2021-01-12 ENCOUNTER — Encounter: Payer: Self-pay | Admitting: Urology

## 2021-01-12 ENCOUNTER — Ambulatory Visit
Admission: RE | Admit: 2021-01-12 | Discharge: 2021-01-12 | Disposition: A | Discharge status: Home / Self Care | Payer: Medicare Other | Source: Ambulatory Visit | Attending: Urology | Admitting: Urology

## 2021-01-12 DIAGNOSIS — Z79899 Other long term (current) drug therapy: Secondary | ICD-10-CM | POA: Diagnosis not present

## 2021-01-12 DIAGNOSIS — E1151 Type 2 diabetes mellitus with diabetic peripheral angiopathy without gangrene: Secondary | ICD-10-CM | POA: Insufficient documentation

## 2021-01-12 DIAGNOSIS — Z7984 Long term (current) use of oral hypoglycemic drugs: Secondary | ICD-10-CM | POA: Diagnosis not present

## 2021-01-12 DIAGNOSIS — Z7901 Long term (current) use of anticoagulants: Secondary | ICD-10-CM | POA: Insufficient documentation

## 2021-01-12 DIAGNOSIS — I10 Essential (primary) hypertension: Secondary | ICD-10-CM | POA: Insufficient documentation

## 2021-01-12 DIAGNOSIS — N481 Balanitis: Secondary | ICD-10-CM

## 2021-01-12 DIAGNOSIS — Z86718 Personal history of other venous thrombosis and embolism: Secondary | ICD-10-CM | POA: Diagnosis not present

## 2021-01-12 DIAGNOSIS — Z7982 Long term (current) use of aspirin: Secondary | ICD-10-CM | POA: Diagnosis not present

## 2021-01-12 DIAGNOSIS — Z794 Long term (current) use of insulin: Secondary | ICD-10-CM | POA: Diagnosis not present

## 2021-01-12 DIAGNOSIS — N471 Phimosis: Secondary | ICD-10-CM

## 2021-01-12 DIAGNOSIS — N476 Balanoposthitis: Secondary | ICD-10-CM | POA: Diagnosis present

## 2021-01-12 DIAGNOSIS — Z87891 Personal history of nicotine dependence: Secondary | ICD-10-CM | POA: Diagnosis not present

## 2021-01-12 DIAGNOSIS — I2510 Atherosclerotic heart disease of native coronary artery without angina pectoris: Secondary | ICD-10-CM | POA: Diagnosis not present

## 2021-01-12 HISTORY — PX: DORSAL SLIT: SHX6822

## 2021-01-12 LAB — GLUCOSE, CAPILLARY
Glucose-Capillary: 236 mg/dL — ABNORMAL HIGH (ref 70–99)
Glucose-Capillary: 190 mg/dL — ABNORMAL HIGH (ref 70–99)

## 2021-01-12 LAB — SARS CORONAVIRUS 2 (TAT 6-24 HRS): SARS Coronavirus 2: NEGATIVE

## 2021-01-12 SURGERY — DORSAL SLIT, PREPUCE
Anesthesia: General

## 2021-01-12 MED ORDER — CEFAZOLIN SODIUM 1 G IJ SOLR
INTRAMUSCULAR | Status: AC
  Filled 2021-01-12: qty 20

## 2021-01-12 MED ORDER — HYDROCODONE-ACETAMINOPHEN 5-325 MG PO TABS: 1.0000 | ORAL_TABLET | Freq: Four times a day (QID) | ORAL | 0 refills | Status: AC | PRN

## 2021-01-12 MED ORDER — DEXAMETHASONE SODIUM PHOSPHATE 10 MG/ML IJ SOLN
INTRAMUSCULAR | Status: AC
  Filled 2021-01-12: qty 1

## 2021-01-12 MED ORDER — SODIUM CHLORIDE 0.9 % IV SOLN: INTRAVENOUS | Status: DC

## 2021-01-12 MED ORDER — ACETAMINOPHEN 10 MG/ML IV SOLN: 1000.0000 mg | Freq: Once | INTRAVENOUS | Status: DC | PRN

## 2021-01-12 MED ORDER — DEXAMETHASONE SODIUM PHOSPHATE 10 MG/ML IJ SOLN
INTRAMUSCULAR | Status: DC | PRN
  Administered 2021-01-12: 8 mg via INTRAVENOUS

## 2021-01-12 MED ORDER — BUPIVACAINE HCL (PF) 0.25 % IJ SOLN
INTRAMUSCULAR | Status: AC
  Filled 2021-01-12: qty 30

## 2021-01-12 MED ORDER — OXYCODONE HCL 5 MG/5ML PO SOLN
5.0000 mg | Freq: Once | ORAL | Status: DC | PRN
Start: 2021-01-12 — End: 2021-01-12

## 2021-01-12 MED ORDER — ORAL CARE MOUTH RINSE: 15.0000 mL | Freq: Once | OROMUCOSAL | Status: DC

## 2021-01-12 MED ORDER — FENTANYL CITRATE (PF) 100 MCG/2ML IJ SOLN
INTRAMUSCULAR | Status: DC | PRN
  Administered 2021-01-12: 50 ug via INTRAVENOUS
  Administered 2021-01-12: 25 ug via INTRAVENOUS

## 2021-01-12 MED ORDER — LIDOCAINE HCL (CARDIAC) PF 100 MG/5ML IV SOSY
PREFILLED_SYRINGE | INTRAVENOUS | Status: DC | PRN
  Administered 2021-01-12: 100 mg via INTRAVENOUS

## 2021-01-12 MED ORDER — CHLORHEXIDINE GLUCONATE 0.12 % MT SOLN: 15.0000 mL | Freq: Once | OROMUCOSAL | Status: DC

## 2021-01-12 MED ORDER — ACETAMINOPHEN 10 MG/ML IV SOLN
INTRAVENOUS | Status: AC
  Filled 2021-01-12: qty 100

## 2021-01-12 MED ORDER — ONDANSETRON HCL 4 MG/2ML IJ SOLN
INTRAMUSCULAR | Status: AC
  Filled 2021-01-12: qty 2

## 2021-01-12 MED ORDER — PROPOFOL 10 MG/ML IV BOLUS
INTRAVENOUS | Status: DC | PRN
  Administered 2021-01-12: 150 mg via INTRAVENOUS

## 2021-01-12 MED ORDER — OXYCODONE HCL 5 MG PO TABS: 5.0000 mg | ORAL_TABLET | Freq: Once | ORAL | Status: DC | PRN

## 2021-01-12 MED ORDER — BUPIVACAINE HCL 0.25 % IJ SOLN
INTRAMUSCULAR | Status: DC | PRN
  Administered 2021-01-12: 10 mL

## 2021-01-12 MED ORDER — BACITRACIN 500 UNIT/GM EX OINT
TOPICAL_OINTMENT | CUTANEOUS | Status: DC | PRN
  Administered 2021-01-12: 1 via TOPICAL

## 2021-01-12 MED ORDER — ACETAMINOPHEN 10 MG/ML IV SOLN
INTRAVENOUS | Status: DC | PRN
  Administered 2021-01-12: 1000 mg via INTRAVENOUS

## 2021-01-12 MED ORDER — CEFAZOLIN SODIUM-DEXTROSE 2-4 GM/100ML-% IV SOLN
2.0000 g | INTRAVENOUS | Status: AC
  Administered 2021-01-12: 2 g via INTRAVENOUS

## 2021-01-12 MED ORDER — BACITRACIN ZINC 500 UNIT/GM EX OINT
TOPICAL_OINTMENT | CUTANEOUS | Status: AC
  Filled 2021-01-12: qty 28.35

## 2021-01-12 MED ORDER — FENTANYL CITRATE (PF) 100 MCG/2ML IJ SOLN
25.0000 ug | INTRAMUSCULAR | Status: DC | PRN
Start: 2021-01-12 — End: 2021-01-12

## 2021-01-12 MED ORDER — FENTANYL CITRATE (PF) 100 MCG/2ML IJ SOLN
INTRAMUSCULAR | Status: AC
  Filled 2021-01-12: qty 2

## 2021-01-12 MED ORDER — ONDANSETRON HCL 4 MG/2ML IJ SOLN
INTRAMUSCULAR | Status: DC | PRN
  Administered 2021-01-12: 4 mg via INTRAVENOUS

## 2021-01-12 MED ORDER — ONDANSETRON HCL 4 MG/2ML IJ SOLN: 4.0000 mg | Freq: Once | INTRAMUSCULAR | Status: DC | PRN

## 2021-01-12 SURGICAL SUPPLY — 31 items
APL PRP STRL LF DISP 70% ISPRP (MISCELLANEOUS) ×1
BLADE CLIPPER SURG (BLADE) IMPLANT
BLADE SURG 15 STRL LF DISP TIS (BLADE) ×1 IMPLANT
BLADE SURG 15 STRL SS (BLADE) ×2
BNDG COHESIVE 1X5 TAN NS LF (GAUZE/BANDAGES/DRESSINGS) IMPLANT
BNDG CONFORM 2 STRL LF (GAUZE/BANDAGES/DRESSINGS) IMPLANT
CANISTER SUCT 1200ML W/VALVE (MISCELLANEOUS) IMPLANT
CHLORAPREP W/TINT 26 (MISCELLANEOUS) ×2 IMPLANT
COVER WAND RF STERILE (DRAPES) ×2 IMPLANT
DRAPE LAPAROTOMY 77X122 PED (DRAPES) ×2 IMPLANT
ELECT REM PT RETURN 9FT ADLT (ELECTROSURGICAL) ×2
ELECTRODE REM PT RTRN 9FT ADLT (ELECTROSURGICAL) ×1 IMPLANT
GAUZE PETROLATUM 1 X8 (GAUZE/BANDAGES/DRESSINGS) IMPLANT
GLOVE SURG UNDER POLY LF SZ7.5 (GLOVE) ×4 IMPLANT
GOWN STRL REUS W/ TWL LRG LVL3 (GOWN DISPOSABLE) ×1 IMPLANT
GOWN STRL REUS W/ TWL XL LVL3 (GOWN DISPOSABLE) ×1 IMPLANT
GOWN STRL REUS W/TWL LRG LVL3 (GOWN DISPOSABLE) ×2
GOWN STRL REUS W/TWL XL LVL3 (GOWN DISPOSABLE) ×2
KIT TURNOVER KIT A (KITS) ×2 IMPLANT
LABEL OR SOLS (LABEL) ×2 IMPLANT
MANIFOLD NEPTUNE II (INSTRUMENTS) ×2 IMPLANT
NEEDLE HYPO 25X1 1.5 SAFETY (NEEDLE) ×2 IMPLANT
NS IRRIG 500ML POUR BTL (IV SOLUTION) ×2 IMPLANT
PACK BASIN MINOR ARMC (MISCELLANEOUS) ×2 IMPLANT
SOL PREP PVP 2OZ (MISCELLANEOUS) ×4
SOLUTION PREP PVP 2OZ (MISCELLANEOUS) ×2 IMPLANT
SURGILUBE 2OZ TUBE FLIPTOP (MISCELLANEOUS) IMPLANT
SUT CHROMIC 3 0 SH 27 (SUTURE) ×2 IMPLANT
SUT VIC AB 3-0 SH 27 (SUTURE) ×2
SUT VIC AB 3-0 SH 27X BRD (SUTURE) ×1 IMPLANT
SYR 10ML LL (SYRINGE) ×2 IMPLANT

## 2021-01-12 NOTE — Discharge Instructions (Signed)

## 2021-01-12 NOTE — Transfer of Care (Signed)
Immediate Anesthesia Transfer of Care Note  Patient: Austin [name]  Procedure(s) Performed: DORSAL SLIT (N/A )  Patient Location: PACU  Anesthesia Type:General  Level of Consciousness: sedated  Airway & Oxygen Therapy: Patient Spontanous Breathing and Patient connected to face mask oxygen  Post-op Assessment: Report given to RN and Post -op Vital signs reviewed and stable  Post vital signs: Reviewed and stable  Last Vitals:  Vitals Value Taken Time  BP 134/92 01/12/21 1341  Temp    Pulse 87 01/12/21 1343  Resp 13 01/12/21 1343  SpO2 100 % 01/12/21 1343  Vitals shown include unvalidated device data.  Last Pain:  Vitals:   01/12/21 1209  TempSrc: Temporal  PainSc: 0-No pain         Complications: No complications documented.

## 2021-01-12 NOTE — Anesthesia Postprocedure Evaluation (Signed)
Anesthesia Post Note  Patient: Austin [name]  Procedure(s) Performed: DORSAL SLIT (N/A )  Patient location during evaluation: PACU Anesthesia Type: General Level of consciousness: awake and alert Pain management: pain level controlled Vital Signs Assessment: post-procedure vital signs reviewed and stable Respiratory status: spontaneous breathing, nonlabored ventilation, respiratory function stable and patient connected to nasal cannula oxygen Cardiovascular status: blood pressure returned to baseline and stable Postop Assessment: no apparent nausea or vomiting Anesthetic complications: no   No complications documented.   Last Vitals:  Vitals:   01/12/21 1447 01/12/21 1512  BP: (!) 144/96 (!) 134/91  Pulse: 60 (!) 45  Resp: 18 18  Temp: 36.7 C 36.7 C  SpO2: 100% 98%    Last Pain:  Vitals:   01/12/21 1512  TempSrc: Temporal  PainSc: 0-No pain                 Lenard Simmer

## 2021-01-12 NOTE — Anesthesia Procedure Notes (Signed)
Procedure Name: LMA Insertion Date/Time: 01/12/2021 1:07 PM Performed by: Karoline Caldwell, CRNA Pre-anesthesia Checklist: Patient identified, Patient being monitored, Timeout performed, Emergency Drugs available and Suction available Patient Re-evaluated:Patient Re-evaluated prior to induction Oxygen Delivery Method: Circle system utilized Preoxygenation: Pre-oxygenation with 100% oxygen Induction Type: IV induction Ventilation: Mask ventilation without difficulty LMA: LMA inserted LMA Size: 4.5 Tube type: Oral Number of attempts: 1 Placement Confirmation: positive ETCO2 and breath sounds checked- equal and bilateral Tube secured with: Tape Dental Injury: Teeth and Oropharynx as per pre-operative assessment

## 2021-01-12 NOTE — Interval H&P Note (Signed)
UROLOGY H&P UPDATE  Agree with prior H&P dated 3/15 by Michiel Cowboy, PA.  65 year old male with recurrent balanitis despite multiple courses of oral antibiotics and topical creams.  Cardiac: RRR Lungs: CTA bilaterally  Laterality: N/A Procedure: Dorsal slit  Informed consent obtained, we specifically discussed the risks of bleeding, infection, post-operative pain, need for additional procedures.  Sondra Come, MD 01/12/2021

## 2021-01-12 NOTE — Anesthesia Preprocedure Evaluation (Signed)
Anesthesia Evaluation  Patient identified by MRN, date of birth, ID band Patient awake    Reviewed: Allergy & Precautions, NPO status , Patient's Chart, lab work & pertinent test results, reviewed documented beta blocker date and time   History of Anesthesia Complications Negative for: history of anesthetic complications  Airway Mallampati: III  TM Distance: >3 FB Neck ROM: Full    Dental  (+) Chipped, Missing, Poor Dentition   Pulmonary neg sleep apnea, neg COPD, Patient abstained from smoking.Not current smoker, former smoker,    Pulmonary exam normal breath sounds clear to auscultation       Cardiovascular Exercise Tolerance: Poor METShypertension, Pt. on medications + CAD and + Peripheral Vascular Disease  (-) Past MI + dysrhythmias Atrial Fibrillation  Rhythm:Regular Rate:Normal - Systolic murmurs    Neuro/Psych negative neurological ROS  negative psych ROS   GI/Hepatic neg GERD  ,(+)     (-) substance abuse  ,   Endo/Other  diabetes, Type 2  Renal/GU negative Renal ROS     Musculoskeletal   Abdominal   Peds  Hematology   Anesthesia Other Findings Past Medical History: No date: Coronary artery disease No date: Diabetes ([facility]) No date: DVT (deep venous thrombosis) ([facility]) No date: GERD (gastroesophageal reflux disease) No date: Hyperlipidemia No date: Hypertension No date: Peripheral vascular disease ([facility])  Reproductive/Obstetrics                             Anesthesia Physical  Anesthesia Plan  ASA: III  Anesthesia Plan: General   Post-op Pain Management:    Induction: Intravenous  PONV Risk Score and Plan: 3 and Ondansetron, Dexamethasone and Treatment may vary due to age or medical condition  Airway Management Planned: LMA  Additional Equipment: None  Intra-op Plan:   Post-operative Plan: Extubation in OR  Informed Consent: I have reviewed the patients History  and Physical, chart, labs and discussed the procedure including the risks, benefits and alternatives for the proposed anesthesia with the patient or authorized representative who has indicated his/her understanding and acceptance.     Dental advisory given  Plan Discussed with: CRNA  Anesthesia Plan Comments: (Discussed risks of anesthesia with patient, including PONV, sore throat, lip/dental damage. Rare risks discussed as well, such as cardiorespiratory and neurological sequelae. Patient understands.)        Anesthesia Quick Evaluation

## 2021-01-12 NOTE — Op Note (Signed)
Date of procedure: 01/12/21  Preoperative diagnosis:  1. Balanitis 2. Phimosis  Postoperative diagnosis:  1. Same  Procedure: 1. Dorsal slit  Surgeon: Legrand Rams, MD  Anesthesia: General  Complications: None  Intraoperative findings:  1.  Uncomplicated dorsal slit  EBL: Minimal  Specimens: None  Drains: None  Indication: Austin [name] is a 65 y.o. 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  After reviewing the management options for treatment, they elected to proceed with the above surgical procedure(s). We have discussed the potential benefits and risks of the procedure, side effects of the proposed treatment, the likelihood of the patient achieving the goals of the procedure, and any potential problems that might occur during the procedure or recuperation. Informed consent has been obtained.  Description of procedure:  The patient was taken to the operating room and general anesthesia was induced. SCDs were placed for DVT prophylaxis. The patient was placed in the supine position, prepped and draped in the usual sterile fashion, and preoperative antibiotics(Ancef) were administered. A preoperative time-out was performed.   I was able to retract the foreskin and there was significant debris underneath.  This was thoroughly cleaned with 4 x 4's and Betadine.  A dorsal block was performed by injecting a total of 10 mL of local anesthetic without epinephrine at the base of the penis.  A straight clamp was used on the foreskin dorsally, and Metzenbaum scissors used to open the foreskin.  This was performed all the way to the base of the foreskin and the glans was widely visible.  Meticulous hemostasis was achieved with cautery.  A running 3-0 chromic was used on either side to close the incisions.  Bacitracin was applied.  Disposition: Stable to PACU  Plan: Bacitracin as needed to incision Wound check in  clinic in 6 weeks  Legrand Rams, MD

## 2021-01-13 ENCOUNTER — Encounter: Payer: Self-pay | Admitting: Urology

## 2021-01-16 ENCOUNTER — Encounter: Payer: Self-pay | Admitting: *Deleted

## 2021-01-16 ENCOUNTER — Telehealth: Payer: Self-pay | Admitting: *Deleted

## 2021-01-16 DIAGNOSIS — Z87891 Personal history of nicotine dependence: Secondary | ICD-10-CM

## 2021-01-16 DIAGNOSIS — Z122 Encounter for screening for malignant neoplasm of respiratory organs: Secondary | ICD-10-CM

## 2021-01-16 NOTE — Telephone Encounter (Signed)
Received referral for initial lung cancer screening scan. Contacted patient and obtained smoking history,(former smoker, quit 10/2018, 1 ppd x 30 years) as well as answering questions related to screening process. Patient denies signs of lung cancer such as weight loss or hemoptysis. Patient denies comorbidity that would prevent curative treatment if lung cancer were found. Patient is scheduled for shared decision making visit and CT scan on 02/13/21 @ 1:45 pm.

## 2021-02-13 ENCOUNTER — Inpatient Hospital Stay: Payer: Medicare Other | Admitting: Hospice and Palliative Medicine

## 2021-02-13 ENCOUNTER — Other Ambulatory Visit: Payer: Self-pay

## 2021-02-13 ENCOUNTER — Ambulatory Visit: Admission: RE | Admit: 2021-02-13 | Payer: Medicare Other | Source: Ambulatory Visit

## 2021-03-30 ENCOUNTER — Emergency Department: Payer: 59

## 2021-03-30 ENCOUNTER — Inpatient Hospital Stay
Admission: EM | Admit: 2021-03-30 | Discharge: 2021-04-02 | DRG: 638 | Disposition: A | Discharge status: Home Health | Payer: 59 | Attending: Obstetrics and Gynecology | Admitting: Obstetrics and Gynecology

## 2021-03-30 ENCOUNTER — Other Ambulatory Visit: Payer: Self-pay

## 2021-03-30 DIAGNOSIS — I10 Essential (primary) hypertension: Secondary | ICD-10-CM | POA: Diagnosis present

## 2021-03-30 DIAGNOSIS — Z86718 Personal history of other venous thrombosis and embolism: Secondary | ICD-10-CM | POA: Diagnosis not present

## 2021-03-30 DIAGNOSIS — Z806 Family history of leukemia: Secondary | ICD-10-CM

## 2021-03-30 DIAGNOSIS — E11621 Type 2 diabetes mellitus with foot ulcer: Secondary | ICD-10-CM | POA: Diagnosis present

## 2021-03-30 DIAGNOSIS — E1151 Type 2 diabetes mellitus with diabetic peripheral angiopathy without gangrene: Secondary | ICD-10-CM | POA: Diagnosis present

## 2021-03-30 DIAGNOSIS — Z89512 Acquired absence of left leg below knee: Secondary | ICD-10-CM | POA: Diagnosis not present

## 2021-03-30 DIAGNOSIS — K219 Gastro-esophageal reflux disease without esophagitis: Secondary | ICD-10-CM | POA: Diagnosis present

## 2021-03-30 DIAGNOSIS — Z833 Family history of diabetes mellitus: Secondary | ICD-10-CM

## 2021-03-30 DIAGNOSIS — Z7901 Long term (current) use of anticoagulants: Secondary | ICD-10-CM | POA: Diagnosis not present

## 2021-03-30 DIAGNOSIS — Z79899 Other long term (current) drug therapy: Secondary | ICD-10-CM | POA: Diagnosis not present

## 2021-03-30 DIAGNOSIS — E785 Hyperlipidemia, unspecified: Secondary | ICD-10-CM | POA: Diagnosis present

## 2021-03-30 DIAGNOSIS — I70219 Atherosclerosis of native arteries of extremities with intermittent claudication, unspecified extremity: Secondary | ICD-10-CM | POA: Diagnosis present

## 2021-03-30 DIAGNOSIS — Z7982 Long term (current) use of aspirin: Secondary | ICD-10-CM

## 2021-03-30 DIAGNOSIS — Z7984 Long term (current) use of oral hypoglycemic drugs: Secondary | ICD-10-CM | POA: Diagnosis not present

## 2021-03-30 DIAGNOSIS — Z8249 Family history of ischemic heart disease and other diseases of the circulatory system: Secondary | ICD-10-CM | POA: Diagnosis not present

## 2021-03-30 DIAGNOSIS — M869 Osteomyelitis, unspecified: Secondary | ICD-10-CM

## 2021-03-30 DIAGNOSIS — M7989 Other specified soft tissue disorders: Secondary | ICD-10-CM

## 2021-03-30 DIAGNOSIS — Z89519 Acquired absence of unspecified leg below knee: Secondary | ICD-10-CM

## 2021-03-30 DIAGNOSIS — E1169 Type 2 diabetes mellitus with other specified complication: Secondary | ICD-10-CM | POA: Diagnosis present

## 2021-03-30 DIAGNOSIS — Z794 Long term (current) use of insulin: Secondary | ICD-10-CM

## 2021-03-30 DIAGNOSIS — M86171 Other acute osteomyelitis, right ankle and foot: Secondary | ICD-10-CM | POA: Diagnosis present

## 2021-03-30 DIAGNOSIS — Z87891 Personal history of nicotine dependence: Secondary | ICD-10-CM

## 2021-03-30 DIAGNOSIS — L0390 Cellulitis, unspecified: Secondary | ICD-10-CM

## 2021-03-30 DIAGNOSIS — Z20822 Contact with and (suspected) exposure to covid-19: Secondary | ICD-10-CM | POA: Diagnosis present

## 2021-03-30 DIAGNOSIS — I2510 Atherosclerotic heart disease of native coronary artery without angina pectoris: Secondary | ICD-10-CM | POA: Diagnosis present

## 2021-03-30 DIAGNOSIS — M86671 Other chronic osteomyelitis, right ankle and foot: Secondary | ICD-10-CM | POA: Diagnosis not present

## 2021-03-30 DIAGNOSIS — L03115 Cellulitis of right lower limb: Secondary | ICD-10-CM | POA: Diagnosis present

## 2021-03-30 LAB — COMPREHENSIVE METABOLIC PANEL
ALT: 21 U/L (ref 0–44)
AST: 24 U/L (ref 15–41)
Albumin: 3.6 g/dL (ref 3.5–5.0)
Alkaline Phosphatase: 62 U/L (ref 38–126)
Anion gap: 9 (ref 5–15)
BUN: 10 mg/dL (ref 8–23)
CO2: 25 mmol/L (ref 22–32)
Calcium: 9.1 mg/dL (ref 8.9–10.3)
Chloride: 105 mmol/L (ref 98–111)
Creatinine, Ser: 1.07 mg/dL (ref 0.61–1.24)
GFR, Estimated: >60 mL/min (ref >60)
Glucose, Bld: 126 mg/dL — ABNORMAL HIGH (ref 70–99)
Potassium: 3.7 mmol/L (ref 3.5–5.1)
Sodium: 139 mmol/L (ref 135–145)
Total Bilirubin: 0.8 mg/dL (ref 0.3–1.2)
Total Protein: 7.4 g/dL (ref 6.5–8.1)

## 2021-03-30 LAB — RESP PANEL BY RT-PCR (FLU A&B, COVID) ARPGX2
Influenza A by PCR: NEGATIVE
Influenza B by PCR: NEGATIVE
SARS Coronavirus 2 by RT PCR: NEGATIVE

## 2021-03-30 LAB — CBC WITH DIFFERENTIAL/PLATELET
Abs Immature Granulocytes: 0.05 10*3/uL (ref 0.00–0.07)
Basophils Absolute: 0 10*3/uL (ref 0.0–0.1)
Basophils Relative: 0 %
Eosinophils Absolute: 0.1 10*3/uL (ref 0.0–0.5)
Eosinophils Relative: 1 %
HCT: 45.7 % (ref 39.0–52.0)
Hemoglobin: 15.3 g/dL (ref 13.0–17.0)
Immature Granulocytes: 1 %
Lymphocytes Relative: 32 %
Lymphs Abs: 2.6 10*3/uL (ref 0.7–4.0)
MCH: 27.1 pg (ref 26.0–34.0)
MCHC: 33.5 g/dL (ref 30.0–36.0)
MCV: 81 fL (ref 80.0–100.0)
Monocytes Absolute: 0.7 10*3/uL (ref 0.1–1.0)
Monocytes Relative: 8 %
Neutro Abs: 4.8 10*3/uL (ref 1.7–7.7)
Neutrophils Relative %: 58 %
Platelets: 231 10*3/uL (ref 150–400)
RBC: 5.64 MIL/uL (ref 4.22–5.81)
RDW: 16.5 % — ABNORMAL HIGH (ref 11.5–15.5)
WBC: 8.2 10*3/uL (ref 4.0–10.5)
nRBC: 0 % (ref 0.0–0.2)

## 2021-03-30 LAB — SEDIMENTATION RATE: Sed Rate: 3 mm/hr (ref 0–20)

## 2021-03-30 LAB — URINALYSIS, COMPLETE (UACMP) WITH MICROSCOPIC
Bacteria, UA: NONE SEEN
Bilirubin Urine: NEGATIVE
Glucose, UA: >=500 mg/dL — AB
Hgb urine dipstick: NEGATIVE
Ketones, ur: NEGATIVE mg/dL
Leukocytes,Ua: NEGATIVE
Nitrite: NEGATIVE
Protein, ur: NEGATIVE mg/dL
Specific Gravity, Urine: 1.027 (ref 1.005–1.030)
pH: 5 (ref 5.0–8.0)

## 2021-03-30 LAB — GLUCOSE, CAPILLARY: Glucose-Capillary: 163 mg/dL — ABNORMAL HIGH (ref 70–99)

## 2021-03-30 LAB — LACTIC ACID, PLASMA
Lactic Acid, Venous: 2.1 mmol/L (ref 0.5–1.9)
Lactic Acid, Venous: 2.1 mmol/L (ref 0.5–1.9)

## 2021-03-30 LAB — CBG MONITORING, ED: Glucose-Capillary: 114 mg/dL — ABNORMAL HIGH (ref 70–99)

## 2021-03-30 LAB — BRAIN NATRIURETIC PEPTIDE: B Natriuretic Peptide: 67.6 pg/mL (ref 0.0–100.0)

## 2021-03-30 MED ORDER — ACETAMINOPHEN 325 MG PO TABS: 650.0000 mg | ORAL_TABLET | Freq: Four times a day (QID) | ORAL | Status: DC | PRN

## 2021-03-30 MED ORDER — SENNOSIDES-DOCUSATE SODIUM 8.6-50 MG PO TABS: 1.0000 | ORAL_TABLET | Freq: Every evening | ORAL | Status: DC | PRN

## 2021-03-30 MED ORDER — LOSARTAN POTASSIUM 25 MG PO TABS
25.0000 mg | ORAL_TABLET | Freq: Every day | ORAL | Status: DC
  Administered 2021-03-31 – 2021-04-02 (×3): 25 mg via ORAL
  Filled 2021-03-30 (×3): qty 1

## 2021-03-30 MED ORDER — SODIUM CHLORIDE 0.9 % IV SOLN
1.0000 g | INTRAVENOUS | Status: DC
  Filled 2021-03-30: qty 10

## 2021-03-30 MED ORDER — VANCOMYCIN HCL 2000 MG/400ML IV SOLN
2000.0000 mg | Freq: Once | INTRAVENOUS | Status: AC
  Administered 2021-03-30: 2000 mg via INTRAVENOUS
  Filled 2021-03-30: qty 400

## 2021-03-30 MED ORDER — INSULIN ASPART 100 UNIT/ML IJ SOLN
4.0000 [IU] | Freq: Three times a day (TID) | INTRAMUSCULAR | Status: DC
  Administered 2021-03-31 – 2021-04-02 (×8): 4 [IU] via SUBCUTANEOUS
  Filled 2021-03-30 (×8): qty 1

## 2021-03-30 MED ORDER — MAGNESIUM CITRATE PO SOLN
1.0000 | Freq: Once | ORAL | Status: DC | PRN
  Filled 2021-03-30: qty 296

## 2021-03-30 MED ORDER — GADOBUTROL 1 MMOL/ML IV SOLN
10.0000 mL | Freq: Once | INTRAVENOUS | Status: AC | PRN
  Administered 2021-03-30: 10 mL via INTRAVENOUS

## 2021-03-30 MED ORDER — INSULIN GLARGINE 100 UNIT/ML ~~LOC~~ SOLN
10.0000 [IU] | Freq: Every day | SUBCUTANEOUS | Status: DC
  Administered 2021-03-30: 10 [IU] via SUBCUTANEOUS
  Filled 2021-03-30 (×2): qty 0.1

## 2021-03-30 MED ORDER — SODIUM CHLORIDE 0.9 % IV SOLN
1.0000 g | Freq: Once | INTRAVENOUS | Status: AC
  Administered 2021-03-30: 1 g via INTRAVENOUS
  Filled 2021-03-30: qty 10

## 2021-03-30 MED ORDER — VANCOMYCIN HCL 1500 MG/300ML IV SOLN
1500.0000 mg | Freq: Two times a day (BID) | INTRAVENOUS | Status: DC
  Administered 2021-03-31 – 2021-04-02 (×5): 1500 mg via INTRAVENOUS
  Filled 2021-03-30 (×7): qty 300

## 2021-03-30 MED ORDER — VANCOMYCIN HCL IN DEXTROSE 1-5 GM/200ML-% IV SOLN: 1000.0000 mg | Freq: Once | INTRAVENOUS | Status: DC

## 2021-03-30 MED ORDER — ACETAMINOPHEN 650 MG RE SUPP: 650.0000 mg | Freq: Four times a day (QID) | RECTAL | Status: DC | PRN

## 2021-03-30 MED ORDER — ONDANSETRON HCL 4 MG/2ML IJ SOLN: 4.0000 mg | Freq: Four times a day (QID) | INTRAMUSCULAR | Status: DC | PRN

## 2021-03-30 MED ORDER — HYDRALAZINE HCL 25 MG PO TABS
25.0000 mg | ORAL_TABLET | Freq: Four times a day (QID) | ORAL | Status: DC | PRN
Start: 2021-03-30 — End: 2021-04-02

## 2021-03-30 MED ORDER — SODIUM CHLORIDE 0.9 % IV BOLUS
500.0000 mL | Freq: Once | INTRAVENOUS | Status: AC
  Administered 2021-03-30: 500 mL via INTRAVENOUS

## 2021-03-30 MED ORDER — BISACODYL 5 MG PO TBEC: 5.0000 mg | DELAYED_RELEASE_TABLET | Freq: Every day | ORAL | Status: DC | PRN

## 2021-03-30 MED ORDER — HYDROCODONE-ACETAMINOPHEN 5-325 MG PO TABS
1.0000 | ORAL_TABLET | ORAL | Status: DC | PRN
  Administered 2021-03-31 – 2021-04-02 (×3): 1 via ORAL
  Filled 2021-03-30 (×3): qty 1

## 2021-03-30 MED ORDER — DOCUSATE SODIUM 100 MG PO CAPS
100.0000 mg | ORAL_CAPSULE | Freq: Two times a day (BID) | ORAL | Status: DC
  Administered 2021-03-30 – 2021-04-02 (×5): 100 mg via ORAL
  Filled 2021-03-30 (×6): qty 1

## 2021-03-30 MED ORDER — INSULIN ASPART 100 UNIT/ML IJ SOLN
0.0000 [IU] | Freq: Three times a day (TID) | INTRAMUSCULAR | Status: DC
  Administered 2021-03-31: 3 [IU] via SUBCUTANEOUS
  Administered 2021-03-31: 5 [IU] via SUBCUTANEOUS
  Administered 2021-03-31: 2 [IU] via SUBCUTANEOUS
  Administered 2021-04-01 (×2): 5 [IU] via SUBCUTANEOUS
  Administered 2021-04-01: 3 [IU] via SUBCUTANEOUS
  Administered 2021-04-02 (×2): 2 [IU] via SUBCUTANEOUS
  Filled 2021-03-30 (×8): qty 1

## 2021-03-30 MED ORDER — ASPIRIN EC 81 MG PO TBEC
81.0000 mg | DELAYED_RELEASE_TABLET | Freq: Every day | ORAL | Status: DC
  Administered 2021-03-31 – 2021-04-02 (×3): 81 mg via ORAL
  Filled 2021-03-30 (×2): qty 1

## 2021-03-30 MED ORDER — ATORVASTATIN CALCIUM 20 MG PO TABS
80.0000 mg | ORAL_TABLET | Freq: Every day | ORAL | Status: DC
  Administered 2021-03-31 – 2021-04-01 (×2): 80 mg via ORAL
  Filled 2021-03-30 (×2): qty 4

## 2021-03-30 MED ORDER — MORPHINE SULFATE (PF) 2 MG/ML IV SOLN: 2.0000 mg | INTRAVENOUS | Status: DC | PRN

## 2021-03-30 MED ORDER — ONDANSETRON HCL 4 MG PO TABS: 4.0000 mg | ORAL_TABLET | Freq: Four times a day (QID) | ORAL | Status: DC | PRN

## 2021-03-30 NOTE — ED Notes (Signed)
Called Lab regarding Lactic Acid

## 2021-03-30 NOTE — ED Notes (Signed)
Food tray given to pt

## 2021-03-30 NOTE — ED Provider Notes (Signed)
[facility] Emergency Department Provider Note  ____________________________________________   Event Date/Time   First MD Initiated Contact with Patient 03/30/21 1051     (approximate)  I have reviewed the triage vital signs and the nursing notes.   HISTORY  Chief Complaint Wound Infection    HPI Austin [name] is a 65 y.o. male with prior DVT, diabetes who comes in with concerns for osteomyelitis.  Patient had a wound on his right foot for several months and yesterday went to Dr cline  office and had an x-ray that showed osteomyelitis therefore patient was referred here.  prior below-knee amputation on the left.  Patient reports that he is got some pain on his right foot constant, worse at nighttime, better at rest.  He states that he had a stent put in his leg recently.  He is not on any blood thinners but has a prior history of DVT.  Patient was seen by podiatry recently to have debridement of his ulcers on his right foot.  Patient does have some swelling in this leg but he thinks that its been there for a long time.  Denies any shortness of breath.    I reviewed patient's records on on 6/22 he had concern for osteomyelitis of the fifth metatarsal head with some overlying soft tissue swelling and some scattered soft tissue lucency which could represent soft tissue gas.          Past Medical History:  Diagnosis Date   Coronary artery disease    Diabetes ([place])    DVT (deep venous thrombosis) (HCC)    GERD (gastroesophageal reflux disease)    Hyperlipidemia    Hypertension    Peripheral vascular disease ([place])     Patient Active Problem List   Diagnosis Date Noted   Diabetes mellitus type 2, uncomplicated ([place]) 16/07/9603   Atherosclerosis of native arteries of the extremities with ulceration ([place]) 01/01/2019   Status post below-knee amputation ([place]) 11/30/2018   Diabetic foot infection ([place]) 10/14/2018   Malnutrition of moderate degree  10/02/2018   Foot ulcer ([place]) 09/29/2018   Gangrene of left foot ([place]) 02/09/2018   Atherosclerosis of native arteries of extremity with intermittent claudication ([place]) 05/25/2017   Diabetes ([place]) 05/25/2017   Essential hypertension 05/25/2017   Hyperlipidemia 05/25/2017    Past Surgical History:  Procedure Laterality Date   AMPUTATION Left 10/28/2018   Procedure: AMPUTATION BELOW KNEE;  Surgeon: Algernon Huxley, MD;  Location: [facility];  Service: Vascular;  Laterality: Left;   AMPUTATION TOE Left 02/10/2018   Procedure: AMPUTATION TOE;  Surgeon: Sharlotte Alamo, DPM;  Location: [facility];  Service: Podiatry;  Laterality: Left;   APPLICATION OF WOUND VAC Left 10/16/2018   Procedure: APPLICATION OF WOUND VAC;  Surgeon: Samara Deist, DPM;  Location: [facility];  Service: Podiatry;  Laterality: Left;   DORSAL SLIT N/A 01/12/2021   Procedure: DORSAL SLIT;  Surgeon: Billey Co, MD;  Location: [facility];  Service: Urology;  Laterality: N/A;   groin surgery     IRRIGATION AND DEBRIDEMENT FOOT Left 09/30/2018   Procedure: IRRIGATION AND DEBRIDEMENT FOOT;  Surgeon: Sharlotte Alamo, DPM;  Location: [facility];  Service: Podiatry;  Laterality: Left;   LOWER EXTREMITY ANGIOGRAPHY Left 02/12/2018   Procedure: Lower Extremity Angiography;  Surgeon: Algernon Huxley, MD;  Location: [facility];  Service: Cardiovascular;  Laterality: Left;   LOWER EXTREMITY ANGIOGRAPHY Left 10/01/2018   Procedure: Lower Extremity Angiography;  Surgeon: Algernon Huxley,  MD;  Location: [place] [facility];  Service: Cardiovascular;  Laterality: Left;   LOWER EXTREMITY ANGIOGRAPHY Left 10/19/2018   Procedure: Lower Extremity Angiography;  Surgeon: Algernon Huxley, MD;  Location: [place] [facility];  Service: Cardiovascular;  Laterality: Left;   LOWER EXTREMITY ANGIOGRAPHY Left 10/20/2018   Procedure: LOWER EXTREMITY ANGIOGRAPHY;  Surgeon: Algernon Huxley, MD;  Location: [place] [facility];  Service: Cardiovascular;  Laterality: Left;    LOWER EXTREMITY ANGIOGRAPHY Right 11/25/2018   Procedure: LOWER EXTREMITY ANGIOGRAPHY;  Surgeon: Algernon Huxley, MD;  Location: [facility];  Service: Cardiovascular;  Laterality: Right;   LOWER EXTREMITY ANGIOGRAPHY Right 01/10/2020   Procedure: LOWER EXTREMITY ANGIOGRAPHY;  Surgeon: Algernon Huxley, MD;  Location: [facility];  Service: Cardiovascular;  Laterality: Right;   LOWER EXTREMITY ANGIOGRAPHY Right 12/11/2020   Procedure: LOWER EXTREMITY ANGIOGRAPHY;  Surgeon: Algernon Huxley, MD;  Location: [place] [facility];  Service: Cardiovascular;  Laterality: Right;   LOWER EXTREMITY INTERVENTION  02/12/2018   Procedure: LOWER EXTREMITY INTERVENTION;  Surgeon: Algernon Huxley, MD;  Location: [place];  Service: Cardiovascular;;   NECK SURGERY     TRANSMETATARSAL AMPUTATION Left 10/16/2018   Procedure: TRANSMETATARSAL AMPUTATION LEFT FOOT;  Surgeon: Samara Deist, DPM;  Location: [facility];  Service: Podiatry;  Laterality: Left;    Prior to Admission medications   Medication Sig Start Date End Date Taking? Authorizing Provider  ACCU-CHEK GUIDE test strip  06/26/20   [provider]  apixaban (ELIQUIS) 5 MG TABS tablet Take 1 tablet (5 mg total) by mouth 2 (two) times daily. 02/12/18   Nicholes Mango, MD  aspirin EC 81 MG tablet Take 1 tablet (81 mg total) by mouth daily. 01/10/20   Algernon Huxley, MD  atorvastatin (LIPITOR) 80 MG tablet Take 1 tablet (80 mg total) by mouth daily at 6 PM. 02/12/18   Gouru, Aruna, MD  Blood Glucose Monitoring Suppl (ACCU-CHEK GUIDE) w/Device KIT USE 1 KIT AS DIRECTED FOUR TIMES A DAY 10/27/20   [provider]  Docusate Sodium (DSS) 100 MG CAPS Take 100 mg by mouth in the morning and at bedtime.    [provider]  empagliflozin (JARDIANCE) 25 MG TABS tablet Take 10 mg by mouth daily. 11/13/20   [provider]  HUMALOG KWIKPEN 100 UNIT/ML KwikPen Inject 24 Units into the skin 3 (three) times daily before meals. 12/08/19    [provider]  insulin aspart (NOVOLOG) 100 UNIT/ML injection Inject 10 Units into the skin 3 (three) times daily with meals. Patient not taking: No sig reported 10/30/18   Bettey Costa, MD  insulin glargine (LANTUS) 100 UNIT/ML injection Inject 0.3 mLs (30 Units total) into the skin at bedtime. Patient taking differently: Inject 24 Units into the skin at bedtime. 10/30/18   Bettey Costa, MD  losartan (COZAAR) 25 MG tablet Take 25 mg by mouth daily. 11/13/20   [provider]  polyethylene glycol (MIRALAX) packet Take 17 g by mouth daily. Patient not taking: No sig reported 10/04/18   Hillary Bow, MD  TRUEPLUS 5-BEVEL PEN NEEDLES 31G X 6 MM MISC  10/25/20   [provider]    Allergies Patient has no known allergies.  Family History  Problem Relation Age of Onset   Diabetes Mother    Coronary artery disease Father    Hypertension Sister    Leukemia Brother     Social History Social History   Tobacco Use  Smoking status: Former    Packs/day: 1.00    Years: 30.00    Pack years: 30.00    Types: Cigarettes    Quit date: 10/07/2018    Years since quitting: 2.4   Smokeless tobacco: Never  Vaping Use   Vaping Use: Never used  Substance Use Topics   Alcohol use: No   Drug use: No      Review of Systems Constitutional: No fever/chills Eyes: No visual changes. ENT: No sore throat. Cardiovascular: Denies chest pain. Respiratory: Denies shortness of breath. Gastrointestinal: No abdominal pain.  No nausea, no vomiting.  No diarrhea.  No constipation. Genitourinary: Negative for dysuria. Musculoskeletal: + foot pain, R leg swelling Skin: Negative for rash. Neurological: Negative for headaches, focal weakness or numbness. All other ROS negative ____________________________________________   PHYSICAL EXAM:  VITAL SIGNS: ED Triage Vitals  Enc Vitals Group     BP 03/30/21 0954 (!) 154/76     Pulse Rate 03/30/21 0954 99     Resp 03/30/21 0954 18      Temp 03/30/21 0957 97.6 F (36.4 C)     Temp Source 03/30/21 0954 Oral     SpO2 03/30/21 0954 97 %     Weight 03/30/21 0954 245 lb (111.1 kg)     Height 03/30/21 0954 _0  (1.93 m)     Head Circumference --      Peak Flow --      Pain Score 03/30/21 0954 0     Pain Loc --      Pain Edu? --      Excl. in [place]? --     Constitutional: Alert and oriented. Well appearing and in no acute distress. Eyes: Conjunctivae are normal. EOMI. Head: Atraumatic. Nose: No congestion/rhinnorhea. Mouth/Throat: Mucous membranes are moist.   Neck: No stridor. Trachea Midline. FROM Cardiovascular: Normal rate, regular rhythm. Grossly normal heart sounds.  Good peripheral circulation. Respiratory: Normal respiratory effort.  No retractions. Lungs CTAB. Gastrointestinal: Soft and nontender. No distention. No abdominal bruits.  Musculoskeletal: BKA on the left, right leg with 1+ pitting edema with a little bit of drainage noted from the shin and some tenderness on the foot.  No obvious redness or warmth.  He does have some superficial ulcers noted to the foot morning close to the first metatarsal.  His foot is warm and well-perfused. Neurologic:  Normal speech and language. No gross focal neurologic deficits are appreciated.  Skin:  Skin is warm, dry and intact. No rash noted. Psychiatric: Mood and affect are normal. Speech and behavior are normal. GU: Deferred   ____________________________________________   LABS (all labs ordered are listed, but only abnormal results are displayed)  Labs Reviewed  LACTIC ACID, PLASMA - Abnormal; Notable for the following components:      Result Value   Lactic Acid, Venous 2.1 (*)    All other components within normal limits  COMPREHENSIVE METABOLIC PANEL - Abnormal; Notable for the following components:   Glucose, Bld 126 (*)    All other components within normal limits  CBC WITH DIFFERENTIAL/PLATELET - Abnormal; Notable for the following components:   RDW  16.5 (*)    All other components within normal limits  URINALYSIS, COMPLETE (UACMP) WITH MICROSCOPIC - Abnormal; Notable for the following components:   Color, Urine YELLOW (*)    APPearance CLEAR (*)    Glucose, UA >=500 (*)    All other components within normal limits  SEDIMENTATION RATE  LACTIC ACID, PLASMA   ____________________________________________  RADIOLOGY   Official radiology report(s): US Venous Img Lower Unilateral Right  Result Date: 03/30/2021 CLINICAL DATA:  Right lower extremity pain and edema for the past 2 months. Patient is currently on anticoagulation. Evaluate for DVT. EXAM: RIGHT LOWER EXTREMITY VENOUS DOPPLER ULTRASOUND TECHNIQUE: Gray-scale sonography with graded compression, as well as color Doppler and duplex ultrasound were performed to evaluate the lower extremity deep venous systems from the level of the common femoral vein and including the common femoral, femoral, profunda femoral, popliteal and calf veins including the posterior tibial, peroneal and gastrocnemius veins when visible. The superficial great saphenous vein was also interrogated. Spectral Doppler was utilized to evaluate flow at rest and with distal augmentation maneuvers in the common femoral, femoral and popliteal veins. COMPARISON:  None. FINDINGS: Contralateral Common Femoral Vein: Respiratory phasicity is normal and symmetric with the symptomatic side. No evidence of thrombus. Normal compressibility. Common Femoral Vein: No evidence of thrombus. Normal compressibility, respiratory phasicity and response to augmentation. Saphenofemoral Junction: No evidence of thrombus. Normal compressibility and flow on color Doppler imaging. Profunda Femoral Vein: No evidence of thrombus. Normal compressibility and flow on color Doppler imaging. Femoral Vein: No evidence of thrombus. Normal compressibility, respiratory phasicity and response to augmentation. Popliteal Vein: No evidence of thrombus. Normal  compressibility, respiratory phasicity and response to augmentation. Calf Veins: No evidence of thrombus. Normal compressibility and flow on color Doppler imaging. Superficial Great Saphenous Vein: No evidence of thrombus. Normal compressibility. Venous Reflux:  None. Other Findings: There is a moderate amount of subcutaneous edema at the level of the right calf (images 33, 34 and 44). Note is made of a prominent though benign appearing right inguinal lymph node which is not enlarged by size criteria measuring 0.9 cm in greatest short axis diameter and maintains a benign fatty hilum, presumably reactive in etiology. IMPRESSION: No evidence of DVT within the right lower extremity. Electronically Signed   By: Sandi Mariscal M.D.   On: 03/30/2021 12:33    ____________________________________________   PROCEDURES  Procedure(s) performed (including Critical Care):  Procedures   ____________________________________________   INITIAL IMPRESSION / ASSESSMENT AND PLAN / ED COURSE  Austin [name] was evaluated in Emergency Department on 03/30/2021 for the symptoms described in the history of present illness. He was evaluated in the context of the global COVID-19 pandemic, which necessitated consideration that the patient might be at risk for infection with the SARS-CoV-2 virus that causes COVID-19. Institutional protocols and algorithms that pertain to the evaluation of patients at risk for COVID-19 are in a state of rapid change based on information released by regulatory bodies including the CDC and federal and state organizations. These policies and algorithms were followed during the patient's care in the ED.    Patient comes in with abnormal x-ray concerning for potential osteomyelitis.  On examination he does have some swelling in that leg so we will get ultrasound to make sure no DVT.  Will get MRIs to evaluate for osteomyelitis.  Given he does have a little bit of drainage out of his shin we will also  get his tib-fib as well.  He does not appear septic.  Vitals are very well-appearing and labs were ordered and he has normal sed rate and normal white count.  Lactate was slightly elevated but I suspect that this was erroneous.  Patient will be given some fluids and have a repeat done.  At this time I have very low suspicion for neck fasc although he does have some swelling  in that foot there is no warmth or redness noted.  However I think that the MRI and repeat x-rays will be useful to decide further management  12:22 PM discussed with Dr. Vickki Muff from podiatry who recommended getting repeat x-ray since we are not able to see the images as well as MRI to further evaluate.  After imaging recommend be discussing with him to see if patient needs admission.  He agreed that some of the gas could just be from the debridement of the ulcers and on examination he does not appear to have any signs of neck Fas.  No crepitus.  Not significantly warm or red.  And his sed rate and white count are normal  Patient handed off to oncoming team pending MRI and x-ray and discussion with podiatry       ____________________________________________   FINAL CLINICAL IMPRESSION(S) / ED DIAGNOSES   Final diagnoses:  Foot swelling      MEDICATIONS GIVEN DURING THIS VISIT:  Medications  sodium chloride 0.9 % bolus 500 mL (500 mLs Intravenous New Bag/Given 03/30/21 1246)     ED Discharge Orders     None        Note:  This document was prepared using Dragon voice recognition software and may include unintentional dictation errors.    Vanessa [place], MD 03/30/21 [phone]

## 2021-03-30 NOTE — Consult Note (Signed)
Pharmacy Antibiotic Note  Austin [name] is a 65 y.o. male admitted on 03/30/2021 with  concern for osteomyelitis of right foot .  Pharmacy has been consulted for Vancomycin dosing. Patient also receiving Rocephin 1g IV Q24 hours.  Plan: Vancomycin 1500 mg IV Q 12 hrs. Goal AUC 400-550. Expected AUC: 503.2 Expected Css: 14.5 SCr used: 1.07   Height: 6\' 4"  (193 cm) Weight: 111.1 kg (245 lb) IBW/kg (Calculated) : 86.8  Temp (24hrs), Avg:97.6 F (36.4 C), Min:97.6 F (36.4 C), Max:97.6 F (36.4 C)  Recent Labs  Lab 03/30/21 0958 03/30/21 1548  WBC 8.2  --   CREATININE 1.07  --   LATICACIDVEN 2.1* 2.1*    Estimated Creatinine Clearance: 93.9 mL/min (by C-G formula based on SCr of 1.07 mg/dL).    No Known Allergies  Antimicrobials this admission: Vanc/Rocephin 6/24 >>   Microbiology results: 6/24 BCx: pending   Thank you for allowing pharmacy to be a part of this patient's care.  Marcos Ruelas A Trisha Morandi 03/30/2021 5:02 PM

## 2021-03-30 NOTE — Consult Note (Signed)
ORTHOPAEDIC CONSULTATION  REQUESTING PHYSICIAN: Tacey Ruiz, MD  Chief Complaint: X-ray concerning for osteomyelitis right foot  HPI: Austin [name] is a 65 y.o. male who complains of swelling to his right foot.  He was seen by his primary care provider who sent him for an x-ray yesterday.  X-ray was concerning for osteomyelitis and erosive changes to the fifth metatarsal.  He presented to the ER today for evaluation and treatment.  He is status post left BKA amputation.  Has been seen by vascular surgery.  Last vascular evaluation in April showed good ABIs with good flow to the leg.  He denies any pain though he is neuropathic.  Is a high risk patient to undergo further amputation with history of peripheral vascular disease diabetes and left leg BKA  Past Medical History:  Diagnosis Date   Coronary artery disease    Diabetes ([place])    DVT (deep venous thrombosis) (H[facility])    GERD (gastroesophageal reflux disease)    Hyperlipidemia    Hypertension    Peripheral vascular disease ([place])    Past Surgical History:  Procedure Laterality Date   AMPUTATION Left 10/28/2018   Procedure: AMPUTATION BELOW KNEE;  Surgeon: Algernon Huxley, MD;  Location: [facility];  Service: Vascular;  Laterality: Left;   AMPUTATION TOE Left 02/10/2018   Procedure: AMPUTATION TOE;  Surgeon: Sharlotte Alamo, DPM;  Location: [facility];  Service: Podiatry;  Laterality: Left;   APPLICATION OF WOUND VAC Left 10/16/2018   Procedure: APPLICATION OF WOUND VAC;  Surgeon: Samara Deist, DPM;  Location: [facility];  Service: Podiatry;  Laterality: Left;   DORSAL SLIT N/A 01/12/2021   Procedure: DORSAL SLIT;  Surgeon: Billey Co, MD;  Location: [facility];  Service: Urology;  Laterality: N/A;   groin surgery     IRRIGATION AND DEBRIDEMENT FOOT Left 09/30/2018   Procedure: IRRIGATION AND DEBRIDEMENT FOOT;  Surgeon: Sharlotte Alamo, DPM;  Location: [facility];  Service: Podiatry;  Laterality: Left;   LOWER EXTREMITY ANGIOGRAPHY Left 02/12/2018    Procedure: Lower Extremity Angiography;  Surgeon: Algernon Huxley, MD;  Location: [place] [facility];  Service: Cardiovascular;  Laterality: Left;   LOWER EXTREMITY ANGIOGRAPHY Left 10/01/2018   Procedure: Lower Extremity Angiography;  Surgeon: Algernon Huxley, MD;  Location: [place] [facility];  Service: Cardiovascular;  Laterality: Left;   LOWER EXTREMITY ANGIOGRAPHY Left 10/19/2018   Procedure: Lower Extremity Angiography;  Surgeon: Algernon Huxley, MD;  Location: [place] [facility];  Service: Cardiovascular;  Laterality: Left;   LOWER EXTREMITY ANGIOGRAPHY Left 10/20/2018   Procedure: LOWER EXTREMITY ANGIOGRAPHY;  Surgeon: Algernon Huxley, MD;  Location: [place] [facility];  Service: Cardiovascular;  Laterality: Left;   LOWER EXTREMITY ANGIOGRAPHY Right 11/25/2018   Procedure: LOWER EXTREMITY ANGIOGRAPHY;  Surgeon: Algernon Huxley, MD;  Location: [place] [facility];  Service: Cardiovascular;  Laterality: Right;   LOWER EXTREMITY ANGIOGRAPHY Right 01/10/2020   Procedure: LOWER EXTREMITY ANGIOGRAPHY;  Surgeon: Algernon Huxley, MD;  Location: [place] [facility];  Service: Cardiovascular;  Laterality: Right;   LOWER EXTREMITY ANGIOGRAPHY Right 12/11/2020   Procedure: LOWER EXTREMITY ANGIOGRAPHY;  Surgeon: Algernon Huxley, MD;  Location: [place] [facility];  Service: Cardiovascular;  Laterality: Right;   LOWER EXTREMITY INTERVENTION  02/12/2018   Procedure: LOWER EXTREMITY INTERVENTION;  Surgeon: Algernon Huxley, MD;  Location: [place] [facility];  Service: Cardiovascular;;   NECK SURGERY     TRANSMETATARSAL AMPUTATION Left 10/16/2018   Procedure: TRANSMETATARSAL  AMPUTATION LEFT FOOT;  Surgeon: Samara Deist, DPM;  Location: [facility];  Service: Podiatry;  Laterality: Left;   Social History   Socioeconomic History   Marital status: Divorced    Spouse name: Not on file   Number of children: Not on file   Years of education: Not on file   Highest education level: Not on file  Occupational History   Not on  file  Tobacco Use   Smoking status: Former    Packs/day: 1.00    Years: 30.00    Pack years: 30.00    Types: Cigarettes    Quit date: 10/07/2018    Years since quitting: 2.4   Smokeless tobacco: Never  Vaping Use   Vaping Use: Never used  Substance and Sexual Activity   Alcohol use: No   Drug use: No   Sexual activity: Not Currently  Other Topics Concern   Not on file  Social History Narrative   Not on file   Social Determinants of Health   Financial Resource Strain: Not on file  Food Insecurity: Not on file  Transportation Needs: Not on file  Physical Activity: Not on file  Stress: Not on file  Social Connections: Not on file   Family History  Problem Relation Age of Onset   Diabetes Mother    Coronary artery disease Father    Hypertension Sister    Leukemia Brother    No Known Allergies Prior to Admission medications   Medication Sig Start Date End Date Taking? Authorizing Provider  apixaban (ELIQUIS) 5 MG TABS tablet Take 1 tablet (5 mg total) by mouth 2 (two) times daily. 02/12/18  Yes Gouru, Illene Silver, MD  aspirin EC 81 MG tablet Take 1 tablet (81 mg total) by mouth daily. 01/10/20  Yes Algernon Huxley, MD  atorvastatin (LIPITOR) 80 MG tablet Take 1 tablet (80 mg total) by mouth daily at 6 PM. 02/12/18  Yes Gouru, Illene Silver, MD  Docusate Sodium (DSS) 100 MG CAPS Take 100 mg by mouth in the morning and at bedtime.   Yes [provider]  empagliflozin (JARDIANCE) 25 MG TABS tablet Take 10 mg by mouth daily. 11/13/20  Yes [provider]  HUMALOG KWIKPEN 100 UNIT/ML KwikPen Inject 24 Units into the skin 3 (three) times daily before meals. 12/08/19  Yes [provider]  insulin aspart (NOVOLOG) 100 UNIT/ML injection Inject 10 Units into the skin 3 (three) times daily with meals. 10/30/18  Yes Mody, Sital, MD  insulin glargine, 1 Unit Dial, (TOUJEO SOLOSTAR) 300 UNIT/ML Solostar Pen Inject 24 Units into the skin at bedtime.   Yes [provider]  losartan  (COZAAR) 25 MG tablet Take 25 mg by mouth daily. 11/13/20  Yes [provider]  ACCU-CHEK GUIDE test strip  06/26/20   [provider]  Blood Glucose Monitoring Suppl (ACCU-CHEK GUIDE) w/Device KIT USE 1 KIT AS DIRECTED FOUR TIMES A DAY 10/27/20   [provider]  insulin glargine (LANTUS) 100 UNIT/ML injection Inject 0.3 mLs (30 Units total) into the skin at bedtime. Patient not taking: Reported on 03/30/2021 10/30/18   Bettey Costa, MD  polyethylene glycol [facility]) packet Take 17 g by mouth daily. Patient not taking: No sig reported 10/04/18   Hillary Bow, MD  TRUEPLUS 5-BEVEL PEN NEEDLES 31G X 6 MM MISC  10/25/20   [provider]   DG Tibia/Fibula Right  Result Date: 03/30/2021 CLINICAL DATA:  Right foot wound for several months. Reported outside radiographs suspicious for osteomyelitis.  History of left lower extremity below the knee amputation. EXAM: RIGHT TIBIA AND FIBULA - 2 VIEW COMPARISON:  None. FINDINGS: The bones appear adequately mineralized. No evidence of acute fracture, dislocation or bone destruction. Mild tibiotalar degenerative changes. No evidence of foreign body or soft tissue emphysema. IMPRESSION: No acute or suspicious findings within the right lower leg. Electronically Signed   By: Richardean Sale M.D.   On: 03/30/2021 15:20   MR FOOT RIGHT W WO CONTRAST  Result Date: 03/30/2021 CLINICAL DATA:  Right foot wound for several months. Reported outside radiographs suspicious for osteomyelitis. History of left lower extremity below the knee amputation. EXAM: MRI OF THE RIGHT FOREFOOT WITHOUT AND WITH CONTRAST TECHNIQUE: Multiplanar, multisequence MR imaging of the right foot was performed before and after the administration of intravenous contrast. CONTRAST:  80m GADAVIST GADOBUTROL 1 MMOL/ML IV SOLN COMPARISON:  Radiographs today and 05/04/2014. FINDINGS: Bones/Joint/Cartilage As demonstrated on radiographs, there are erosions and destruction of the  5th metatarsal head. There is associated marrow edema and enhancement consistent with osteomyelitis. There are minimal marrow changes in the adjacent base of the 5th proximal phalanx. The additional metatarsals and toes appear normal. No significant joint effusions. Ligaments Intact Lisfranc ligament. Muscles and Tendons The forefoot tendons appear intact without significant tenosynovitis. Generalized forefoot muscular atrophy with mild T2 hyperintensity, but no abnormal enhancement. Soft tissues Soft tissue ulceration plantar to the 5th metatarsal head with diffuse soft tissue enhancement following contrast. No focal fluid collection. Focus of susceptibility artifact more centrally in the plantar forefoot is likely due to an incidental metallic foreign body, not apparent on radiographs. Generalized subcutaneous edema throughout the dorsal forefoot without focal collection or abnormal enhancement. IMPRESSION: 1. Soft tissue ulceration plantar to the 5th metatarsal head with underlying destruction of the 5th metatarsal head consistent with osteomyelitis. 2. No drainable abscess. 3. Generalized dorsal forefoot subcutaneous edema. Electronically Signed   By: WRichardean SaleM.D.   On: 03/30/2021 15:17   MR TIBIA FIBULA RIGHT W WO CONTRAST  Result Date: 03/30/2021 CLINICAL DATA:  Right foot wound for several months. Reported outside radiographs suspicious for osteomyelitis. History of left lower extremity below the knee amputation. EXAM: MRI OF LOWER RIGHT EXTREMITY WITHOUT AND WITH CONTRAST TECHNIQUE: Multiplanar, multisequence MR imaging of the right lower leg was performed both before and after administration of intravenous contrast. CONTRAST:  171mGADAVIST GADOBUTROL 1 MMOL/ML IV SOLN COMPARISON:  Radiographs same date FINDINGS: Bones/Joint/Cartilage The right tibia and fibula appear normal. Specifically, no evidence of osteomyelitis. No significant knee or ankle joint effusion or abnormal synovial enhancement.  Ligaments Not relevant for exam/indication. Muscles and Tendons The ankle tendons appear intact without tenosynovitis. There is some fatty atrophy within the lower leg musculature, especially inferiorly. No suspicious abnormal enhancement or focal fluid collection. Soft tissues Generalized subcutaneous edema and enhancement throughout the lower leg, greatest distally. No focal fluid collection, foreign body or soft tissue emphysema. IMPRESSION: 1. Generalized edema and enhancement throughout the subcutaneous tissues consistent with soft tissue infection (cellulitis). No focal fluid collection or suspicious muscular findings. 2. No evidence of osteomyelitis or septic joint in the lower leg. See separate examination of the right foot. Electronically Signed   By: WiRichardean Sale.D.   On: 03/30/2021 15:25   [place] Img Lower Unilateral Right  Result Date: 03/30/2021 CLINICAL DATA:  Right lower extremity pain and edema for the past 2 months. Patient is currently on anticoagulation. Evaluate for DVT. EXAM: RIGHT LOWER EXTREMITY VENOUS DOPPLER ULTRASOUND TECHNIQUE:  Gray-scale sonography with graded compression, as well as color Doppler and duplex ultrasound were performed to evaluate the lower extremity deep venous systems from the level of the common femoral vein and including the common femoral, femoral, profunda femoral, popliteal and calf veins including the posterior tibial, peroneal and gastrocnemius veins when visible. The superficial great saphenous vein was also interrogated. Spectral Doppler was utilized to evaluate flow at rest and with distal augmentation maneuvers in the common femoral, femoral and popliteal veins. COMPARISON:  None. FINDINGS: Contralateral Common Femoral Vein: Respiratory phasicity is normal and symmetric with the symptomatic side. No evidence of thrombus. Normal compressibility. Common Femoral Vein: No evidence of thrombus. Normal compressibility, respiratory phasicity and response  to augmentation. Saphenofemoral Junction: No evidence of thrombus. Normal compressibility and flow on color Doppler imaging. Profunda Femoral Vein: No evidence of thrombus. Normal compressibility and flow on color Doppler imaging. Femoral Vein: No evidence of thrombus. Normal compressibility, respiratory phasicity and response to augmentation. Popliteal Vein: No evidence of thrombus. Normal compressibility, respiratory phasicity and response to augmentation. Calf Veins: No evidence of thrombus. Normal compressibility and flow on color Doppler imaging. Superficial Great Saphenous Vein: No evidence of thrombus. Normal compressibility. Venous Reflux:  None. Other Findings: There is a moderate amount of subcutaneous edema at the level of the right calf (images 33, 34 and 44). Note is made of a prominent though benign appearing right inguinal lymph node which is not enlarged by size criteria measuring 0.9 cm in greatest short axis diameter and maintains a benign fatty hilum, presumably reactive in etiology. IMPRESSION: No evidence of DVT within the right lower extremity. Electronically Signed   By: Sandi Mariscal M.D.   On: 03/30/2021 12:33   DG Foot Complete Right  Result Date: 03/30/2021 CLINICAL DATA:  Right foot wound for several months. Reported outside radiographs suspicious for osteomyelitis. History of left lower extremity below the knee amputation. EXAM: RIGHT FOOT COMPLETE - 3+ VIEW COMPARISON:  Right foot radiographs 05/04/2014. FINDINGS: The bones are diffusely demineralized. There are new erosions and destruction of the 5th metatarsal head. The 2nd, 3rd and 4th metatarsal heads are also somewhat indistinct, but without suspicious corresponding finding on MRI performed today. There is generalized dorsal forefoot soft tissue swelling. Scattered vascular calcifications are noted. No evidence of soft tissue emphysema. 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  See separate MRI report. Electronically  Signed   By: Richardean Sale M.D.   On: 03/30/2021 15:19    Positive ROS: All other systems have been reviewed and were otherwise negative with the exception of those mentioned in the HPI and as above.  12 point ROS was performed.  Physical Exam: General: Alert and oriented.  No apparent distress.  Vascular:  Left foot: Patient status post left BKA  Right foot: Dorsalis Pedis:  absent Posterior Tibial:  absent  Neuro:absent protective sensation  Derm: On the plantar aspect of the right fifth MTPJ is an area of indentation of the skin with pink discoloration but looks to be completely epithelialized over top of the area.  I specifically attempted to probe the wound with a blunt end of a cotton tip applicator and had no depth to the wound whatsoever.  There is no active drainage.  No lymphangitic streaking to the area.  Ortho/MS: He does have diffuse edema to the right foot.  Right foot is cool to touch at this time.  I did review the x-rays as well as the MRI and agree there are areas of  concern for possible osteomyelitis of the distal fifth metatarsal head both on MRI and x-ray.  Assessment: Peripheral vascular disease with history of ulceration Status post left BKA amputation. Possible osteomyelitis right fifth MTPJ  Plan: At this point he has no open ulceration or wound.  There is no drainage whatsoever to the right fifth toe or joint region.  His foot is cool to touch.  He does not have palpable pulses today.  He is status post left-sided BKA.  He may have a latent osteomyelitis in this fifth MTPJ based on x-ray and MRI but I do not know that this is actively infected at this time.  There is no open ulceration drainage or cellulitis.  At this point I would hold on surgical intervention.  I would recommend he follow-up with vascular surgery for reevaluation.  He may need angio to reevaluate this area.  He should follow-up with podiatry in the outpatient clinic in the next 1 to 2 weeks.   Continue with the use of his postoperative shoe on this right foot for now.    Elesa Hacker, DPM Cell [phone]   03/30/2021 4:56 PM

## 2021-03-30 NOTE — ED Notes (Signed)
Pt transported via transport tech

## 2021-03-30 NOTE — ED Notes (Signed)
Pt taken for MRI.

## 2021-03-30 NOTE — ED Provider Notes (Signed)
-----------------------------------------   4:14 PM on 03/30/2021 -----------------------------------------  Patient's MRI has resulted showing fifth metatarsal osteomyelitis as well as cellulitis of the tibial area.  I spoke to Dr. Ether Griffins who will be by later to see the patient.  We will start the patient on IV antibiotics to cover osteomyelitis vancomycin/Rocephin.  Patient will be admitted to the hospital service for further work-up and treatment, with likely amputation in the near future.  I spoke to the patient regarding his plan of care who is agreeable.   Minna Antis, MD 03/30/21 1615

## 2021-03-30 NOTE — ED Notes (Signed)
Lab at bedside

## 2021-03-30 NOTE — ED Notes (Signed)
Pt on phone for screening with MRI

## 2021-03-30 NOTE — ED Notes (Signed)
Transport requested

## 2021-03-30 NOTE — ED Notes (Signed)
Pt taken for U/S

## 2021-03-30 NOTE — Plan of Care (Signed)
  Problem: Pain Managment: Goal: General experience of comfort will improve Outcome: Progressing   Problem: Safety: Goal: Ability to remain free from injury will improve Outcome: Progressing   Problem: Skin Integrity: Goal: Risk for impaired skin integrity will decrease Outcome: Progressing   

## 2021-03-30 NOTE — ED Triage Notes (Signed)
Pt states he has had a wound on the right foot for several months and yesterday went to Dr. Odessa Fleming office and had an xray that showed osteomyelitis and referred the pt to the ED, pt has BLK amputation of the LLE

## 2021-03-31 ENCOUNTER — Inpatient Hospital Stay: Payer: 59

## 2021-03-31 ENCOUNTER — Encounter
Admission: EM | Disposition: A | Discharge status: Home Health | Payer: Self-pay | Source: Home / Self Care | Attending: Obstetrics and Gynecology

## 2021-03-31 DIAGNOSIS — M86171 Other acute osteomyelitis, right ankle and foot: Secondary | ICD-10-CM

## 2021-03-31 LAB — GLUCOSE, CAPILLARY
Glucose-Capillary: 223 mg/dL — ABNORMAL HIGH (ref 70–99)
Glucose-Capillary: 145 mg/dL — ABNORMAL HIGH (ref 70–99)
Glucose-Capillary: 165 mg/dL — ABNORMAL HIGH (ref 70–99)
Glucose-Capillary: 207 mg/dL — ABNORMAL HIGH (ref 70–99)

## 2021-03-31 LAB — BASIC METABOLIC PANEL
Anion gap: 6 (ref 5–15)
BUN: 10 mg/dL (ref 8–23)
CO2: 27 mmol/L (ref 22–32)
Calcium: 8.8 mg/dL — ABNORMAL LOW (ref 8.9–10.3)
Chloride: 105 mmol/L (ref 98–111)
Creatinine, Ser: 0.78 mg/dL (ref 0.61–1.24)
GFR, Estimated: >60 mL/min (ref >60)
Glucose, Bld: 204 mg/dL — ABNORMAL HIGH (ref 70–99)
Potassium: 3.8 mmol/L (ref 3.5–5.1)
Sodium: 138 mmol/L (ref 135–145)

## 2021-03-31 LAB — CBC
HCT: 41.3 % (ref 39.0–52.0)
Hemoglobin: 13.9 g/dL (ref 13.0–17.0)
MCH: 27.5 pg (ref 26.0–34.0)
MCHC: 33.7 g/dL (ref 30.0–36.0)
MCV: 81.6 fL (ref 80.0–100.0)
Platelets: 199 10*3/uL (ref 150–400)
RBC: 5.06 MIL/uL (ref 4.22–5.81)
RDW: 16.2 % — ABNORMAL HIGH (ref 11.5–15.5)
WBC: 8.4 10*3/uL (ref 4.0–10.5)
nRBC: 0 % (ref 0.0–0.2)

## 2021-03-31 LAB — PROTIME-INR
INR: 1.2 (ref 0.8–1.2)
Prothrombin Time: 14.8 seconds (ref 11.4–15.2)

## 2021-03-31 LAB — HIV ANTIBODY (ROUTINE TESTING W REFLEX): HIV Screen 4th Generation wRfx: NONREACTIVE

## 2021-03-31 LAB — APTT: aPTT: 33 seconds (ref 24–36)

## 2021-03-31 LAB — LACTIC ACID, PLASMA: Lactic Acid, Venous: 2 mmol/L (ref 0.5–1.9)

## 2021-03-31 SURGERY — AMPUTATION, FOOT, RAY
Anesthesia: Choice | Laterality: Right

## 2021-03-31 MED ORDER — SODIUM CHLORIDE 0.9 % IV SOLN: INTRAVENOUS | Status: DC

## 2021-03-31 MED ORDER — APIXABAN 5 MG PO TABS: 5.0000 mg | ORAL_TABLET | Freq: Two times a day (BID) | ORAL | Status: DC

## 2021-03-31 MED ORDER — SODIUM CHLORIDE 0.9 % IV SOLN
2.0000 g | INTRAVENOUS | Status: DC
  Administered 2021-03-31 – 2021-04-01 (×2): 2 g via INTRAVENOUS
  Filled 2021-03-31: qty 20
  Filled 2021-03-31: qty 2
  Filled 2021-03-31: qty 20

## 2021-03-31 MED ORDER — INSULIN GLARGINE 100 UNIT/ML ~~LOC~~ SOLN
20.0000 [IU] | Freq: Every day | SUBCUTANEOUS | Status: DC
  Administered 2021-03-31: 20 [IU] via SUBCUTANEOUS
  Filled 2021-03-31 (×2): qty 0.2

## 2021-03-31 MED ORDER — SODIUM CHLORIDE 0.9 % IV BOLUS
500.0000 mL | Freq: Once | INTRAVENOUS | Status: AC
  Administered 2021-03-31: 500 mL via INTRAVENOUS

## 2021-03-31 NOTE — Consult Note (Signed)
[facility]ist [facility]  Patient name: Austin [name] MRN: [id number] DOB: 05-10-1956 Sex: male   REQUESTING PROVIDER:    Hospitl service   REASON FOR CONSULT:    PAD with ulcer  HISTORY OF PRESENT ILLNESS:   TAESEAN [name] is a 65 y.o. male, who is a patient of Dr. Wyn Quaker who last saw him in April.  He has undergone a left leg amputation.  In April he had normal blood flow studies.  In March 2022, he underwent angioplasty of the peroneal artery and tibioperoneal trunk as well as stenting of the left external iliac artery.  The patient has a ulcer that has been there for approximately 6 months but his symptoms deteriorated over the past week or 2.  He was sent to the emergency department due to concerns over infection.  The patient suffers from diabetes, hypertension, and hyperlipidemia.  He has a history of DVT. PAST MEDICAL HISTORY    Past Medical History:  Diagnosis Date   Coronary artery disease    Diabetes (HCC)    DVT (deep venous thrombosis) (HCC)    GERD (gastroesophageal reflux disease)    Hyperlipidemia    Hypertension    Peripheral vascular disease (HCC)      FAMILY HISTORY   Family History  Problem Relation Age of Onset   Diabetes Mother    Coronary artery disease Father    Hypertension Sister    Leukemia Brother     SOCIAL HISTORY:   Social History   Socioeconomic History   Marital status: Divorced    Spouse name: Not on file   Number of children: Not on file   Years of education: Not on file   Highest education level: Not on file  Occupational History   Not on file  Tobacco Use   Smoking status: Former    Packs/day: 1.00    Years: 30.00    Pack years: 30.00    Types: Cigarettes    Quit date: 10/07/2018    Years since quitting: 2.4   Smokeless tobacco: Never  Vaping Use   Vaping Use: Never used  Substance and Sexual Activity   Alcohol use: No   Drug use: No   Sexual activity: Not  Currently  Other Topics Concern   Not on file  Social History Narrative   Not on file   Social Determinants of Health   Financial Resource Strain: Not on file  Food Insecurity: Not on file  Transportation Needs: Not on file  Physical Activity: Not on file  Stress: Not on file  Social Connections: Not on file  Intimate Partner Violence: Not on file    ALLERGIES:    No Known Allergies  CURRENT MEDICATIONS:    Current Facility-Administered Medications  Medication Dose Route Frequency Provider Last Rate Last Admin   0.9 %  sodium chloride infusion   Intravenous Continuous Marlow Baars, MD 100 mL/hr at 03/31/21 0431 New Bag at 03/31/21 0431   acetaminophen (TYLENOL) tablet 650 mg  650 mg Oral Q6H PRN Marlow Baars, MD       Or   acetaminophen (TYLENOL) suppository 650 mg  650 mg Rectal Q6H PRN Marlow Baars, MD       aspirin EC tablet 81 mg  81 mg Oral Daily Marlow Baars, MD   81 mg at 03/31/21 0939   atorvastatin (LIPITOR) tablet 80 mg  80 mg Oral q1800 Marlow Baars, MD       bisacodyl (DULCOLAX) EC tablet  5 mg  5 mg Oral Daily PRN Marlow Baars, MD       cefTRIAXone (ROCEPHIN) 2 g in sodium chloride 0.9 % 100 mL IVPB  2 g Intravenous Q24H Wouk, Wilfred Curtis, MD       docusate sodium (COLACE) capsule 100 mg  100 mg Oral BID Marlow Baars, MD   100 mg at 03/30/21 2224   hydrALAZINE (APRESOLINE) tablet 25 mg  25 mg Oral Q6H PRN Marlow Baars, MD       HYDROcodone-acetaminophen (NORCO/VICODIN) 5-325 MG per tablet 1-2 tablet  1-2 tablet Oral Q4H PRN Marlow Baars, MD   1 tablet at 03/31/21 1253   insulin aspart (novoLOG) injection 0-15 Units  0-15 Units Subcutaneous TID WC Marlow Baars, MD   2 Units at 03/31/21 1248   insulin aspart (novoLOG) injection 4 Units  4 Units Subcutaneous TID WC Marlow Baars, MD   4 Units at 03/31/21 1248   insulin glargine (LANTUS) injection 20 Units  20 Units Subcutaneous QHS Wouk, Wilfred Curtis, MD       losartan (COZAAR) tablet  25 mg  25 mg Oral Daily Marlow Baars, MD   25 mg at 03/31/21 4098   magnesium citrate solution 1 Bottle  1 Bottle Oral Once PRN Marlow Baars, MD       morphine 2 MG/ML injection 2 mg  2 mg Intravenous Q2H PRN Marlow Baars, MD       ondansetron [facility]) tablet 4 mg  4 mg Oral Q6H PRN Marlow Baars, MD       Or   ondansetron (ZOFRAN) injection 4 mg  4 mg Intravenous Q6H PRN Marlow Baars, MD       senna-docusate (Senokot-S) tablet 1 tablet  1 tablet Oral QHS PRN Marlow Baars, MD       vancomycin (VANCOREADY) IVPB 1500 mg/300 mL  1,500 mg Intravenous Q12H Marlow Baars, MD 150 mL/hr at 03/31/21 0435 1,500 mg at 03/31/21 0435    REVIEW OF SYSTEMS:   [X]  denotes positive finding, [ ]  denotes negative finding Cardiac  Comments:  Chest pain or chest pressure:    Shortness of breath upon exertion:    Short of breath when lying flat:    Irregular heart rhythm:        Vascular    Pain in calf, thigh, or hip brought on by ambulation:    Pain in feet at night that wakes you up from your sleep:  x   Blood clot in your veins:    Leg swelling:         Pulmonary    Oxygen at home:    Productive cough:     Wheezing:         Neurologic    Sudden weakness in arms or legs:     Sudden numbness in arms or legs:     Sudden onset of difficulty speaking or slurred speech:    Temporary loss of vision in one eye:     Problems with dizziness:         Gastrointestinal    Blood in stool:      Vomited blood:         Genitourinary    Burning when urinating:     Blood in urine:        Psychiatric    Major depression:         Hematologic    Bleeding problems:    Problems with blood clotting too easily:  Skin    Rashes or ulcers:        Constitutional    Fever or chills:     PHYSICAL EXAM:   Vitals:   03/31/21 0422 03/31/21 0443 03/31/21 0737 03/31/21 1526  BP: 139/64 112/69 116/64 (!) 143/70  Pulse: 70 (!) 103 64 61  Resp: 18 17 17 17   Temp: (!) 97.5 F (36.4  C) (!) 97.4 F (36.3 C) 97.9 F (36.6 C) 97.8 F (36.6 C)  TempSrc: Oral Oral Oral Oral  SpO2: 100% 98% 100% 99%  Weight:      Height:        GENERAL: The patient is a well-nourished male, in no acute distress. The vital signs are documented above. CARDIAC: There is a regular rate and rhythm.  VASCULAR: Brisk Doppler flow to the right foot PULMONARY: Nonlabored respirations ABDOMEN: Soft and non-tender with normal pitched bowel sounds.  MUSCULOSKELETAL: There are no major deformities or cyanosis. NEUROLOGIC: No focal weakness or paresthesias are detected. SKIN: T see photo below PSYCHIATRIC: The patient has a normal affect.   STUDIES:   I have reviewed the following: MRI foot: 1. Soft tissue ulceration plantar to the 5th metatarsal head with underlying destruction of the 5th metatarsal head consistent with osteomyelitis. 2. No drainable abscess. 3. Generalized dorsal forefoot subcutaneous edema.   MRI, right leg: 1. Generalized edema and enhancement throughout the subcutaneous tissues consistent with soft tissue infection (cellulitis). No focal fluid collection or suspicious muscular findings. 2. No evidence of osteomyelitis or septic joint in the lower leg. See separate examination of the right foot. ASSESSMENT and PLAN   The patient has osteomyelitis of the fifth metatarsal head by MRI.  Most recent noninvasive vascular studies were 2 months ago which showed normal blood flow to the foot.  He appears to have adequate blood flow by physical exam.  I doubt further vascular imaging via angiography would be beneficial.  He does not have an open wound on the foot.  He has been seen by podiatry who is not planning on surgery at this time.  Therefore I would recommend treating his osteomyelitis with 6 weeks of antibiotics and follow-up in vascular clinic.   , MD, FACS [facility] [phone] Pager [phone]

## 2021-03-31 NOTE — Progress Notes (Signed)
PROGRESS NOTE    Austin [name]  HEN:[id number] DOB: [birth year] DOA: 03/30/2021 PCP: Preston Fleeting, MD  Outpatient Specialists: vascular surgery, podiatry    Brief Narrative:   Austin [name] is a 65 y.o. male with medical history significant for diabetes, peripheral vascular disease, left below the knee amputation, history of diabetic ulcers/wounds, history of DVT, hypertension, hyperlipidemia, who presents to the emergency department on 03/30/2021 with worsening pain of his right foot.  Patient first developed ulcer and pain of right foot approximately 6 months prior to admission, but symptoms have worsened in the past 1 to 2 weeks and have been constantly present.  Pain is located in the right foot and sometimes radiates to the lower aspect of his right lower leg. It is up to 10/10 at times and is characterized as sharp. Symptoms are alleviated by nothing and exacerbated by weightbearing. Associated symptoms: Swelling of right foot. Right foot is generally colder.  Patient has not noticed exudate.  No chest pain, palpitations, shortness of breath, coughing, abdominal pain, vomiting, diarrhea, or bloody stool.  Patient receives care for his right foot from his podiatrist and wound care.  He went to the clinic and there was concern about infection, so he was sent to the emergency department.   Assessment & Plan:   Principal Problem:   Acute osteomyelitis of right foot (HCC) Active Problems:   Atherosclerosis of native arteries of extremity with intermittent claudication (HCC)   Essential hypertension   Hyperlipidemia   Type 2 diabetes mellitus with diabetic peripheral angiopathy without gangrene (HCC)   Status post below-knee amputation (HCC)  # Right 5th toe osteomyelitis Hx severe PAD. Hx left BKA. MRI findings consistent w/ osteo. No external lesion, no cellulitis. Foot is cool to touch. Podiatry has seen, declines intervention for now, advises vascular input. - cont  vanc/ceftriaxone - arterial dopplers ordered - vascular consulted - npo for now - cont fluids @ 100 while npo  # PAD - cont aspirin/statin - hold qpixiban - dopplers and vascular consult as above  # T2DM Glucose 200s - increase lantus to 20 qhs - cont SSI - hold home meds - would NOT continue jardiance at discharge given severe PAD, hx amputation, foot infection, etc.   DVT prophylaxis: scds for now Code Status: full Family Communication: none @ bedside  Level of care: Med-Surg Status is: Inpatient  Remains inpatient appropriate because:Inpatient level of care appropriate due to severity of illness  Dispo: The patient is from: Home              Anticipated d/c is to: Home              Patient currently is not medically stable to d/c.   Difficult to place patient No        Consultants:  Podiatry, vascular surgery  Procedures: none  Antimicrobials:  Vanc/ceftriaxone 6/24>    Subjective: This morning some foot pain which he says is chronic. No chest or abd pain, has appetite  Objective: Vitals:   03/30/21 2044 03/31/21 0422 03/31/21 0443 03/31/21 0737  BP: 128/74 139/64 112/69 116/64  Pulse: (!) 57 70 (!) 103 64  Resp: 18 18 17 17   Temp: (!) 97.4 F (36.3 C) (!) 97.5 F (36.4 C) (!) 97.4 F (36.3 C) 97.9 F (36.6 C)  TempSrc: Oral Oral Oral Oral  SpO2: 100% 100% 98% 100%  Weight:      Height:        Intake/Output Summary (Last 24  hours) at 03/31/2021 0856 Last data filed at 03/31/2021 0738 Gross per 24 hour  Intake --  Output 1880 ml  Net -1880 ml   Filed Weights   03/30/21 0954  Weight: 111.1 kg    Examination:  General exam: Appears calm and comfortable  Respiratory system: Clear to auscultation. Respiratory effort normal. Cardiovascular system: S1 & S2 heard, RRR. Soft systolic murmur. Trace LE edema Gastrointestinal system: Abdomen is nondistended, soft and nontender. No organomegaly or masses felt. Normal bowel sounds  heard. Central nervous system: Alert and oriented. No focal neurological deficits. Extremities: Symmetric 5 x 5 power. Skin: no ulcers. Left bka Psychiatry: Judgement and insight appear normal. Mood & affect appropriate.     Data Reviewed: I have personally reviewed following labs and imaging studies  CBC: Recent Labs  Lab 03/30/21 0958 03/31/21 0440  WBC 8.2 8.4  NEUTROABS 4.8  --   HGB 15.3 13.9  HCT 45.7 41.3  MCV 81.0 81.6  PLT 231 199   Basic Metabolic Panel: Recent Labs  Lab 03/30/21 0958 03/31/21 0440  NA 139 138  K 3.7 3.8  CL 105 105  CO2 25 27  GLUCOSE 126* 204*  BUN 10 10  CREATININE 1.07 0.78  CALCIUM 9.1 8.8*   GFR: Estimated Creatinine Clearance: 125.7 mL/min (by C-G formula based on SCr of 0.78 mg/dL). Liver Function Tests: Recent Labs  Lab 03/30/21 0958  AST 24  ALT 21  ALKPHOS 62  BILITOT 0.8  PROT 7.4  ALBUMIN 3.6   No results for input(s): LIPASE, AMYLASE in the last 168 hours. No results for input(s): AMMONIA in the last 168 hours. Coagulation Profile: Recent Labs  Lab 03/31/21 0440  INR 1.2   Cardiac Enzymes: No results for input(s): CKTOTAL, CKMB, CKMBINDEX, TROPONINI in the last 168 hours. BNP (last 3 results) No results for input(s): PROBNP in the last 8760 hours. HbA1C: No results for input(s): HGBA1C in the last 72 hours. CBG: Recent Labs  Lab 03/30/21 1746 03/30/21 2107 03/31/21 0739  GLUCAP 114* 163* 223*   Lipid Profile: No results for input(s): CHOL, HDL, LDLCALC, TRIG, CHOLHDL, LDLDIRECT in the last 72 hours. Thyroid Function Tests: No results for input(s): TSH, T4TOTAL, FREET4, T3FREE, THYROIDAB in the last 72 hours. Anemia Panel: No results for input(s): VITAMINB12, FOLATE, FERRITIN, TIBC, IRON, RETICCTPCT in the last 72 hours. Urine analysis:    Component Value Date/Time   COLORURINE YELLOW (A) 03/30/2021 0958   APPEARANCEUR CLEAR (A) 03/30/2021 0958   APPEARANCEUR Cloudy (A) 11/20/2020 1611   LABSPEC  1.027 03/30/2021 0958   LABSPEC 1.024 11/12/2013 2001   PHURINE 5.0 03/30/2021 0958   GLUCOSEU >=500 (A) 03/30/2021 0958   GLUCOSEU >=500 11/12/2013 2001   HGBUR NEGATIVE 03/30/2021 0958   BILIRUBINUR NEGATIVE 03/30/2021 0958   BILIRUBINUR Negative 11/20/2020 1611   BILIRUBINUR Negative 11/12/2013 2001   KETONESUR NEGATIVE 03/30/2021 0958   PROTEINUR NEGATIVE 03/30/2021 0958   NITRITE NEGATIVE 03/30/2021 0958   LEUKOCYTESUR NEGATIVE 03/30/2021 0958   LEUKOCYTESUR Negative 11/12/2013 2001   Sepsis Labs: @LABRCNTIP (procalcitonin:4,lacticidven:4)  ) Recent Results (from the past 240 hour(s))  Resp Panel by RT-PCR (Flu A&B, Covid) Nasopharyngeal Swab     Status: None   Collection Time: 03/30/21  4:00 PM   Specimen: Nasopharyngeal Swab; Nasopharyngeal(NP) swabs in vial transport medium  Result Value Ref Range Status   SARS Coronavirus 2 by RT PCR NEGATIVE NEGATIVE Final    Comment: (NOTE) SARS-CoV-2 target nucleic acids are NOT DETECTED.  The SARS-CoV-2  RNA is generally detectable in upper respiratory specimens during the acute phase of infection. The lowest concentration of SARS-CoV-2 viral copies this assay can detect is 138 copies/mL. A negative result does not preclude SARS-Cov-2 infection and should not be used as the sole basis for treatment or other patient management decisions. A negative result may occur with  improper specimen collection/handling, submission of specimen other than nasopharyngeal swab, presence of viral mutation(s) within the areas targeted by this assay, and inadequate number of viral copies(<138 copies/mL). A negative result must be combined with clinical observations, patient history, and epidemiological information. The expected result is Negative.  Fact Sheet for Patients:  BloggerCourse.comhttps://www.fda.gov/media/152166/download  Fact Sheet for Healthcare Providers:  SeriousBroker.ithttps://www.fda.gov/media/152162/download  This test is no t yet approved or cleared by the  [place] FDA and  has been authorized for detection and/or diagnosis of SARS-CoV-2 by FDA under an Emergency Use Authorization (EUA). This EUA will remain  in effect (meaning this test can be used) for the duration of the COVID-19 declaration under Section 564(b)(1) of the Act, 21 U.S.C.section 360bbb-3(b)(1), unless the authorization is terminated  or revoked sooner.       Influenza A by PCR NEGATIVE NEGATIVE Final   Influenza B by PCR NEGATIVE NEGATIVE Final    Comment: (NOTE) The Xpert Xpress SARS-CoV-2/FLU/RSV plus assay is intended as an aid in the diagnosis of influenza from Nasopharyngeal swab specimens and should not be used as a sole basis for treatment. Nasal washings and aspirates are unacceptable for Xpert Xpress SARS-CoV-2/FLU/RSV testing.  Fact Sheet for Patients: BloggerCourse.comhttps://www.fda.gov/media/152166/download  Fact Sheet for Healthcare Providers: SeriousBroker.ithttps://www.fda.gov/media/152162/download  This test is not yet approved or cleared by the [place] FDA and has been authorized for detection and/or diagnosis of SARS-CoV-2 by FDA under an Emergency Use Authorization (EUA). This EUA will remain in effect (meaning this test can be used) for the duration of the COVID-19 declaration under Section 564(b)(1) of the Act, 21 U.S.C. section 360bbb-3(b)(1), unless the authorization is terminated or revoked.  Performed at [facility], [address] [place], [place] [address]   Blood culture (routine x 2)     Status: None (Preliminary result)   Collection Time: 03/30/21  4:26 PM   Specimen: BLOOD  Result Value Ref Range Status   Specimen Description BLOOD BLOOD RIGHT WRIST  Final   Special Requests   Final    BOTTLES DRAWN AEROBIC AND ANAEROBIC Blood Culture results may not be optimal due to an inadequate volume of blood received in culture bottles   Culture   Final    NO GROWTH < 24 HOURS Performed at [facility], [address]  [place], [place] [address]    Report Status PENDING  Incomplete  Blood culture (routine x 2)     Status: None (Preliminary result)   Collection Time: 03/30/21  4:39 PM   Specimen: BLOOD  Result Value Ref Range Status   Specimen Description BLOOD LEFT ANTECUBITAL  Final   Special Requests   Final    BOTTLES DRAWN AEROBIC ONLY Blood Culture results may not be optimal due to an inadequate volume of blood received in culture bottles   Culture   Final    NO GROWTH < 24 HOURS Performed at [facility], [address] [place], [place] [address]    Report Status PENDING  Incomplete         Radiology Studies: DG Tibia/Fibula Right  Result Date: 03/30/2021 CLINICAL DATA:  Right foot wound for several  months. Reported outside radiographs suspicious for osteomyelitis. History of left lower extremity below the knee amputation. EXAM: RIGHT TIBIA AND FIBULA - 2 VIEW COMPARISON:  None. FINDINGS: The bones appear adequately mineralized. No evidence of acute fracture, dislocation or bone destruction. Mild tibiotalar degenerative changes. No evidence of foreign body or soft tissue emphysema. IMPRESSION: No acute or suspicious findings within the right lower leg. Electronically Signed   By: Carey Bullocks M.D.   On: 03/30/2021 15:20   MR FOOT RIGHT W WO CONTRAST  Result Date: 03/30/2021 CLINICAL DATA:  Right foot wound for several months. Reported outside radiographs suspicious for osteomyelitis. History of left lower extremity below the knee amputation. EXAM: MRI OF THE RIGHT FOREFOOT WITHOUT AND WITH CONTRAST TECHNIQUE: Multiplanar, multisequence MR imaging of the right foot was performed before and after the administration of intravenous contrast. CONTRAST:  67mL GADAVIST GADOBUTROL 1 MMOL/ML IV SOLN COMPARISON:  Radiographs today and 05/04/2014. FINDINGS: Bones/Joint/Cartilage As demonstrated on radiographs, there are erosions and destruction of the 5th metatarsal head. There is associated marrow  edema and enhancement consistent with osteomyelitis. There are minimal marrow changes in the adjacent base of the 5th proximal phalanx. The additional metatarsals and toes appear normal. No significant joint effusions. Ligaments Intact Lisfranc ligament. Muscles and Tendons The forefoot tendons appear intact without significant tenosynovitis. Generalized forefoot muscular atrophy with mild T2 hyperintensity, but no abnormal enhancement. Soft tissues Soft tissue ulceration plantar to the 5th metatarsal head with diffuse soft tissue enhancement following contrast. No focal fluid collection. Focus of susceptibility artifact more centrally in the plantar forefoot is likely due to an incidental metallic foreign body, not apparent on radiographs. Generalized subcutaneous edema throughout the dorsal forefoot without focal collection or abnormal enhancement. IMPRESSION: 1. Soft tissue ulceration plantar to the 5th metatarsal head with underlying destruction of the 5th metatarsal head consistent with osteomyelitis. 2. No drainable abscess. 3. Generalized dorsal forefoot subcutaneous edema. Electronically Signed   By: Carey Bullocks M.D.   On: 03/30/2021 15:17   MR TIBIA FIBULA RIGHT W WO CONTRAST  Result Date: 03/30/2021 CLINICAL DATA:  Right foot wound for several months. Reported outside radiographs suspicious for osteomyelitis. History of left lower extremity below the knee amputation. EXAM: MRI OF LOWER RIGHT EXTREMITY WITHOUT AND WITH CONTRAST TECHNIQUE: Multiplanar, multisequence MR imaging of the right lower leg was performed both before and after administration of intravenous contrast. CONTRAST:  29mL GADAVIST GADOBUTROL 1 MMOL/ML IV SOLN COMPARISON:  Radiographs same date FINDINGS: Bones/Joint/Cartilage The right tibia and fibula appear normal. Specifically, no evidence of osteomyelitis. No significant knee or ankle joint effusion or abnormal synovial enhancement. Ligaments Not relevant for exam/indication.  Muscles and Tendons The ankle tendons appear intact without tenosynovitis. There is some fatty atrophy within the lower leg musculature, especially inferiorly. No suspicious abnormal enhancement or focal fluid collection. Soft tissues Generalized subcutaneous edema and enhancement throughout the lower leg, greatest distally. No focal fluid collection, foreign body or soft tissue emphysema. IMPRESSION: 1. Generalized edema and enhancement throughout the subcutaneous tissues consistent with soft tissue infection (cellulitis). No focal fluid collection or suspicious muscular findings. 2. No evidence of osteomyelitis or septic joint in the lower leg. See separate examination of the right foot. Electronically Signed   By: Carey Bullocks M.D.   On: 03/30/2021 15:25   US Venous Img Lower Unilateral Right  Result Date: 03/30/2021 CLINICAL DATA:  Right lower extremity pain and edema for the past 2 months. Patient is currently on anticoagulation. Evaluate for DVT. EXAM:  RIGHT LOWER EXTREMITY VENOUS DOPPLER ULTRASOUND TECHNIQUE: Gray-scale sonography with graded compression, as well as color Doppler and duplex ultrasound were performed to evaluate the lower extremity deep venous systems from the level of the common femoral vein and including the common femoral, femoral, profunda femoral, popliteal and calf veins including the posterior tibial, peroneal and gastrocnemius veins when visible. The superficial great saphenous vein was also interrogated. Spectral Doppler was utilized to evaluate flow at rest and with distal augmentation maneuvers in the common femoral, femoral and popliteal veins. COMPARISON:  None. FINDINGS: Contralateral Common Femoral Vein: Respiratory phasicity is normal and symmetric with the symptomatic side. No evidence of thrombus. Normal compressibility. Common Femoral Vein: No evidence of thrombus. Normal compressibility, respiratory phasicity and response to augmentation. Saphenofemoral Junction: No  evidence of thrombus. Normal compressibility and flow on color Doppler imaging. Profunda Femoral Vein: No evidence of thrombus. Normal compressibility and flow on color Doppler imaging. Femoral Vein: No evidence of thrombus. Normal compressibility, respiratory phasicity and response to augmentation. Popliteal Vein: No evidence of thrombus. Normal compressibility, respiratory phasicity and response to augmentation. Calf Veins: No evidence of thrombus. Normal compressibility and flow on color Doppler imaging. Superficial Great Saphenous Vein: No evidence of thrombus. Normal compressibility. Venous Reflux:  None. Other Findings: There is a moderate amount of subcutaneous edema at the level of the right calf (images 33, 34 and 44). Note is made of a prominent though benign appearing right inguinal lymph node which is not enlarged by size criteria measuring 0.9 cm in greatest short axis diameter and maintains a benign fatty hilum, presumably reactive in etiology. IMPRESSION: No evidence of DVT within the right lower extremity. Electronically Signed   By: Simonne Come M.D.   On: 03/30/2021 12:33   DG Foot Complete Right  Result Date: 03/30/2021 CLINICAL DATA:  Right foot wound for several months. Reported outside radiographs suspicious for osteomyelitis. History of left lower extremity below the knee amputation. EXAM: RIGHT FOOT COMPLETE - 3+ VIEW COMPARISON:  Right foot radiographs 05/04/2014. FINDINGS: The bones are diffusely demineralized. There are new erosions and destruction of the 5th metatarsal head. The 2nd, 3rd and 4th metatarsal heads are also somewhat indistinct, but without suspicious corresponding finding on MRI performed today. There is generalized dorsal forefoot soft tissue swelling. Scattered vascular calcifications are noted. No evidence of soft tissue emphysema. 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  See separate MRI report. Electronically Signed   By: Carey Bullocks M.D.   On:  03/30/2021 15:19        Scheduled Meds:  apixaban  5 mg Oral BID   aspirin EC  81 mg Oral Daily   atorvastatin  80 mg Oral q1800   docusate sodium  100 mg Oral BID   insulin aspart  0-15 Units Subcutaneous TID WC   insulin aspart  4 Units Subcutaneous TID WC   insulin glargine  10 Units Subcutaneous QHS   losartan  25 mg Oral Daily   Continuous Infusions:  sodium chloride 100 mL/hr at 03/31/21 0431   cefTRIAXone (ROCEPHIN)  IV     vancomycin 1,500 mg (03/31/21 0435)     LOS: 1 day    Time spent: vascular    Silvano Bilis, MD [facility]   If 7PM-7AM, please contact night-coverage [email or web address] Password [facility] 03/31/2021, 8:56 AM

## 2021-03-31 NOTE — Progress Notes (Signed)
Daily Progress Note   Subjective  - * No surgery date entered *  Follow-up of plantar right foot ulcer with osteomyelitis on x-ray.  Patient complains of just diffuse right lower extremity and foot pain.  Objective Vitals:   03/30/21 2044 03/31/21 0422 03/31/21 0443 03/31/21 0737  BP: 128/74 139/64 112/69 116/64  Pulse: (!) 57 70 (!) 103 64  Resp: 18 18 17 17   Temp: (!) 97.4 F (36.3 C) (!) 97.5 F (36.4 C) (!) 97.4 F (36.3 C) 97.9 F (36.6 C)  TempSrc: Oral Oral Oral Oral  SpO2: 100% 100% 98% 100%  Weight:      Height:        Physical Exam: Still diffuse edema to the right leg and foot.  Foot is still cool to touch at this time  1 skin the plantar ulceration is not open.  Complete epithelialization is noted to the previous ulcerative site.  There is no drainage at this time.  See clinical picture.     Laboratory CBC    Component Value Date/Time   WBC 8.4 03/31/2021 0440   HGB 13.9 03/31/2021 0440   HGB 14.1 11/17/2013 0621   HCT 41.3 03/31/2021 0440   HCT 40.1 11/17/2013 0621   PLT 199 03/31/2021 0440   PLT 218 11/17/2013 0621    BMET    Component Value Date/Time   NA 138 03/31/2021 0440   NA 135 (L) 11/17/2013 0621   K 3.8 03/31/2021 0440   K 4.3 11/17/2013 0621   CL 105 03/31/2021 0440   CL 99 11/17/2013 0621   CO2 27 03/31/2021 0440   CO2 31 11/17/2013 0621   GLUCOSE 204 (H) 03/31/2021 0440   GLUCOSE 172 (H) 11/17/2013 0621   BUN 10 03/31/2021 0440   BUN 10 11/17/2013 0621   CREATININE 0.78 03/31/2021 0440   CREATININE 0.85 11/17/2013 0621   CALCIUM 8.8 (L) 03/31/2021 0440   CALCIUM 9.6 11/17/2013 0621   GFRNONAA >60 03/31/2021 0440   GFRNONAA >60 11/17/2013 0621   GFRAA >60 01/10/2020 0808   GFRAA >60 11/17/2013 01/15/2014    Assessment/Planning: Diabetes with peripheral arterial disease right lower leg status post left-sided BKA Possible osteomyelitis on x-ray and MRI.  Once again the wound has completely healed at this point.  There is no  active drainage or ulceration.  I would hold on surgical debridement for now. Vascular surgery has been consulted.  Would recommend reevaluation. Patient can follow-up with podiatry in the outpatient clinic upon discharge.  Padded bandage was applied to his right foot today.  32[address]  03/31/2021, 10:12 AM

## 2021-03-31 NOTE — H&P (Signed)
History and Physical    Austin [name] OMB:[id number] DOB: September 21, 1956 DOA: 03/30/2021  PCP: Theotis Burrow, MD (Confirm with patient/family/NH records and if not entered, this has to be entered at [facility] point of entry) Patient coming from: clinic.  I have personally briefly reviewed patient's old medical records in [facility].  Chief Complaint: Right foot pain.  HPI: Austin [name] is a 65 y.o. male with medical history significant for diabetes, peripheral vascular disease, left below the knee amputation, history of diabetic ulcers/wounds, history of DVT, hypertension, hyperlipidemia, who presents to the emergency department on 03/30/2021 with worsening pain of his right foot.  Patient first developed ulcer and pain of right foot approximately 6 months prior to admission, but symptoms have worsened in the past 1 to 2 weeks and have been constantly present.  Pain is located in the right foot and sometimes radiates to the lower aspect of his right lower leg. It is up to 10/10 at times and is characterized as sharp. Symptoms are alleviated by nothing and exacerbated by weightbearing. Associated symptoms: Swelling of right foot. Right foot is generally colder.  Patient has not noticed exudate.  No chest pain, palpitations, shortness of breath, coughing, abdominal pain, vomiting, diarrhea, or bloody stool.  Patient receives care for his right foot from his podiatrist and wound care.  He went to the clinic and there was concern about infection, so he was sent to the emergency department.   ED Course: Imaging revealed right foot osteomyelitis of the fifth metatarsal head with soft tissue ulceration plantar to the fifth metatarsal head with no drainable abscess and lower right leg edema likely due to cellulitis.  Patient was started on IV vancomycin and IV ceftriaxone.  Review of Systems: As per HPI otherwise all other systems reviewed and are negative.  GENERAL: No Fever, chills, or  diaphoresis. Positive for fatigue/malaise.   Past Medical History:  Diagnosis Date   Coronary artery disease    Diabetes ([place])    DVT (deep venous thrombosis) (HCC)    GERD (gastroesophageal reflux disease)    Hyperlipidemia    Hypertension    Peripheral vascular disease ([place])     Past Surgical History:  Procedure Laterality Date   AMPUTATION Left 10/28/2018   Procedure: AMPUTATION BELOW KNEE;  Surgeon: Algernon Huxley, MD;  Location: [facility] ORS;  Service: Vascular;  Laterality: Left;   AMPUTATION TOE Left 02/10/2018   Procedure: AMPUTATION TOE;  Surgeon: Sharlotte Alamo, DPM;  Location: [facility] OR[facility];  Service: Podiatry;  Laterality: Left;   APPLICATION OF WOUND VAC Left 10/16/2018   Procedure: APPLICATION OF WOUND VAC;  Surgeon: Samara Deist, DPM;  Location: [facility];  Service: Podiatry;  Laterality: Left;   DORSAL SLIT N/A 01/12/2021   Procedure: DORSAL SLIT;  Surgeon: Billey Co, MD;  Location: [facility] OR[facility];  Service: Urology;  Laterality: N/A;   groin surgery     IRRIGATION AND DEBRIDEMENT FOOT Left 09/30/2018   Procedure: IRRIGATION AND DEBRIDEMENT FOOT;  Surgeon: Sharlotte Alamo, DPM;  Location: [facility];  Service: Podiatry;  Laterality: Left;   LOWER EXTREMITY ANGIOGRAPHY Left 02/12/2018   Procedure: Lower Extremity Angiography;  Surgeon: Algernon Huxley, MD;  Location: [place] [facility];  Service: Cardiovascular;  Laterality: Left;   LOWER EXTREMITY ANGIOGRAPHY Left 10/01/2018   Procedure: Lower Extremity Angiography;  Surgeon: Algernon Huxley, MD;  Location: [place] [facility];  Service: Cardiovascular;  Laterality: Left;   LOWER EXTREMITY ANGIOGRAPHY Left 10/19/2018   Procedure:  Lower Extremity Angiography;  Surgeon: Algernon Huxley, MD;  Location: [place] [facility];  Service: Cardiovascular;  Laterality: Left;   LOWER EXTREMITY ANGIOGRAPHY Left 10/20/2018   Procedure: LOWER EXTREMITY ANGIOGRAPHY;  Surgeon: Algernon Huxley, MD;  Location: [place] [facility];  Service: Cardiovascular;  Laterality:  Left;   LOWER EXTREMITY ANGIOGRAPHY Right 11/25/2018   Procedure: LOWER EXTREMITY ANGIOGRAPHY;  Surgeon: Algernon Huxley, MD;  Location: [place] [facility];  Service: Cardiovascular;  Laterality: Right;   LOWER EXTREMITY ANGIOGRAPHY Right 01/10/2020   Procedure: LOWER EXTREMITY ANGIOGRAPHY;  Surgeon: Algernon Huxley, MD;  Location: [place] [facility];  Service: Cardiovascular;  Laterality: Right;   LOWER EXTREMITY ANGIOGRAPHY Right 12/11/2020   Procedure: LOWER EXTREMITY ANGIOGRAPHY;  Surgeon: Algernon Huxley, MD;  Location: [place] [facility];  Service: Cardiovascular;  Laterality: Right;   LOWER EXTREMITY INTERVENTION  02/12/2018   Procedure: LOWER EXTREMITY INTERVENTION;  Surgeon: Algernon Huxley, MD;  Location: [place] [facility];  Service: Cardiovascular;;   NECK SURGERY     TRANSMETATARSAL AMPUTATION Left 10/16/2018   Procedure: TRANSMETATARSAL AMPUTATION LEFT FOOT;  Surgeon: Samara Deist, DPM;  Location: [facility];  Service: Podiatry;  Laterality: Left;    Social History  reports that he quit smoking about 2 years ago. His smoking use included cigarettes. He has a 30.00 pack-year smoking history. He has never used smokeless tobacco. He reports that he does not drink alcohol and does not use drugs.  No Known Allergies  Family History  Problem Relation Age of Onset   Diabetes Mother    Coronary artery disease Father    Hypertension Sister    Leukemia Brother      Home Medications  Prior to Admission medications   Medication Sig Start Date End Date Taking? Authorizing Provider  apixaban (ELIQUIS) 5 MG TABS tablet Take 1 tablet (5 mg total) by mouth 2 (two) times daily. 02/12/18  Yes Gouru, Illene Silver, MD  aspirin EC 81 MG tablet Take 1 tablet (81 mg total) by mouth daily. 01/10/20  Yes Algernon Huxley, MD  atorvastatin (LIPITOR) 80 MG tablet Take 1 tablet (80 mg total) by mouth daily at 6 PM. 02/12/18  Yes Gouru, Illene Silver, MD  Docusate Sodium (DSS) 100 MG CAPS Take 100 mg by mouth in the morning and at  bedtime.   Yes [provider]  empagliflozin (JARDIANCE) 25 MG TABS tablet Take 10 mg by mouth daily. 11/13/20  Yes [provider]  HUMALOG KWIKPEN 100 UNIT/ML KwikPen Inject 24 Units into the skin 3 (three) times daily before meals. 12/08/19  Yes [provider]  insulin aspart (NOVOLOG) 100 UNIT/ML injection Inject 10 Units into the skin 3 (three) times daily with meals. 10/30/18  Yes Mody, Sital, MD  insulin glargine, 1 Unit Dial, (TOUJEO SOLOSTAR) 300 UNIT/ML Solostar Pen Inject 24 Units into the skin at bedtime.   Yes [provider]  losartan (COZAAR) 25 MG tablet Take 25 mg by mouth daily. 11/13/20  Yes [provider]  ACCU-CHEK GUIDE test strip  06/26/20   [provider]  Blood Glucose Monitoring Suppl (ACCU-CHEK GUIDE) w/Device KIT USE 1 KIT AS DIRECTED FOUR TIMES A DAY 10/27/20   [provider]  insulin glargine (LANTUS) 100 UNIT/ML injection Inject 0.3 mLs (30 Units total) into the skin at bedtime. Patient not taking: Reported on 03/30/2021 10/30/18   Bettey Costa, MD  polyethylene glycol [facility]) packet Take 17 g by mouth daily. Patient not taking: No  sig reported 10/04/18   Hillary Bow, MD  TRUEPLUS 5-BEVEL PEN NEEDLES 31G X 6 MM MISC  10/25/20   [provider]    Physical Exam: Vitals:   03/30/21 0954 03/30/21 0957 03/30/21 1856 03/30/21 2044  BP: (!) 154/76  (!) 144/87 128/74  Pulse: 99  79 (!) 57  Resp: _0 Temp:  97.6 F (36.4 C) (!) 97.5 F (36.4 C) (!) 97.4 F (36.3 C)  TempSrc: Oral   Oral  SpO2: 97%  99% 100%  Weight: 111.1 kg     Height: _1  (1.93 m)       Constitutional: NAD, calm, comfortable, ill-appearing. Vitals:   03/30/21 0954 03/30/21 0957 03/30/21 1856 03/30/21 2044  BP: (!) 154/76  (!) 144/87 128/74  Pulse: 99  79 (!) 57  Resp: _2 Temp:  97.6 F (36.4 C) (!) 97.5 F (36.4 C) (!) 97.4 F (36.3 C)  TempSrc: Oral   Oral  SpO2: 97%  99% 100%  Weight: 111.1 kg      Height: _3  (1.93 m)      Eyes: PERRL, lids and conjunctivae without icterus or erythema. ENMT: Mucous membranes are dry. Posterior pharynx clear of any exudate or lesions. Nares patent without discharge or bleeding.  Normocephalic, atraumatic.  Normal dentition.  Neck: normal, supple, no masses, trachea midline.  Thyroid nontender, no masses appreciated, no thyromegaly. Respiratory: clear to auscultation bilaterally. Chest wall movements are symmetric. No wheezing, no crackles.  No rhonchi.  Normal respiratory effort. No accessory muscle use.  Cardiovascular: Regular rate and rhythm, no rubs / gallops. Pulses: radial pulses 2+ bilaterally. DP pulse barely palpable on right. No carotid bruits.  Capillary refill less than 3 seconds. Edema: Right foot and lower right leg edema. GI: soft, non-distended, normal active bowel sounds. No hepatosplenomegaly. No rigidity, rebound, or guarding. Non-tender. No masses palpated.  Musculoskeletal: no clubbing / cyanosis. No joint deformity upper and lower extremities. Good ROM, no contractures. Normal muscle tone.  No tenderness or deformity in the back bilaterally.  Left below the knee amputation.  Right foot tenderness and edema. Integument: No induration. Clean, dry. Tenderness of right foot, edema, areas of hyperpigmentation and hypopigmentation, slightly cool to palpation. Neurologic: CN 2-12 grossly intact. Sensation grossly intact to light touch. DTR 2+ bilaterally upper extremities.  Babinski: Toe non-reactive on right; amputation on left.  Strength 5/5 in upper extremities and remainder of left lower extremity. Strength 4/5 in right lower extremity.  Intact rapid alternating movements bilaterally.  No pronator drift. Psychiatric: Normal judgment and insight. Alert and oriented x 3. Normal mood.  Normal and appropriate affect. Lymphatic: No cervical lymphadenopathy. No supraclavicular lymphadenopathy.   Labs on Admission: I have personally reviewed the  following labs and imaging studies.  CBC: Recent Labs  Lab 03/30/21 0958  WBC 8.2  NEUTROABS 4.8  HGB 15.3  HCT 45.7  MCV 81.0  PLT 670    Basic Metabolic Panel: Recent Labs  Lab 03/30/21 0958  NA 139  K 3.7  CL 105  CO2 25  GLUCOSE 126*  BUN 10  CREATININE 1.07  CALCIUM 9.1    GFR: Estimated Creatinine Clearance: 93.9 mL/min (by C-G formula based on SCr of 1.07 mg/dL).  Liver Function Tests: Recent Labs  Lab 03/30/21 0958  AST 24  ALT 21  ALKPHOS 62  BILITOT 0.8  PROT 7.4  ALBUMIN 3.6    Urine analysis:    Component Value Date/Time  COLORURINE YELLOW (A) 03/30/2021 0958   APPEARANCEUR CLEAR (A) 03/30/2021 0958   APPEARANCEUR Cloudy (A) 11/20/2020 1611   LABSPEC 1.027 03/30/2021 0958   LABSPEC 1.024 11/12/2013 2001   PHURINE 5.0 03/30/2021 0958   GLUCOSEU >=500 (A) 03/30/2021 0958   GLUCOSEU >=500 11/12/2013 2001   HGBUR NEGATIVE 03/30/2021 0958   BILIRUBINUR NEGATIVE 03/30/2021 0958   BILIRUBINUR Negative 11/20/2020 1611   BILIRUBINUR Negative 11/12/2013 2001   KETONESUR NEGATIVE 03/30/2021 0958   PROTEINUR NEGATIVE 03/30/2021 0958   NITRITE NEGATIVE 03/30/2021 0958   LEUKOCYTESUR NEGATIVE 03/30/2021 0958   LEUKOCYTESUR Negative 11/12/2013 2001    Radiological Exams on Admission: DG Tibia/Fibula Right  Result Date: 03/30/2021 CLINICAL DATA:  Right foot wound for several months. Reported outside radiographs suspicious for osteomyelitis. History of left lower extremity below the knee amputation. EXAM: RIGHT TIBIA AND FIBULA - 2 VIEW COMPARISON:  None. FINDINGS: The bones appear adequately mineralized. No evidence of acute fracture, dislocation or bone destruction. Mild tibiotalar degenerative changes. No evidence of foreign body or soft tissue emphysema. IMPRESSION: No acute or suspicious findings within the right lower leg. Electronically Signed   By: Richardean Sale M.D.   On: 03/30/2021 15:20   MR FOOT RIGHT W WO CONTRAST  Result Date:  03/30/2021 CLINICAL DATA:  Right foot wound for several months. Reported outside radiographs suspicious for osteomyelitis. History of left lower extremity below the knee amputation. EXAM: MRI OF THE RIGHT FOREFOOT WITHOUT AND WITH CONTRAST TECHNIQUE: Multiplanar, multisequence MR imaging of the right foot was performed before and after the administration of intravenous contrast. CONTRAST:  1m GADAVIST GADOBUTROL 1 MMOL/ML IV SOLN COMPARISON:  Radiographs today and 05/04/2014. FINDINGS: Bones/Joint/Cartilage As demonstrated on radiographs, there are erosions and destruction of the 5th metatarsal head. There is associated marrow edema and enhancement consistent with osteomyelitis. There are minimal marrow changes in the adjacent base of the 5th proximal phalanx. The additional metatarsals and toes appear normal. No significant joint effusions. Ligaments Intact Lisfranc ligament. Muscles and Tendons The forefoot tendons appear intact without significant tenosynovitis. Generalized forefoot muscular atrophy with mild T2 hyperintensity, but no abnormal enhancement. Soft tissues Soft tissue ulceration plantar to the 5th metatarsal head with diffuse soft tissue enhancement following contrast. No focal fluid collection. Focus of susceptibility artifact more centrally in the plantar forefoot is likely due to an incidental metallic foreign body, not apparent on radiographs. Generalized subcutaneous edema throughout the dorsal forefoot without focal collection or abnormal enhancement. IMPRESSION: 1. Soft tissue ulceration plantar to the 5th metatarsal head with underlying destruction of the 5th metatarsal head consistent with osteomyelitis. 2. No drainable abscess. 3. Generalized dorsal forefoot subcutaneous edema. Electronically Signed   By: WRichardean SaleM.D.   On: 03/30/2021 15:17   MR TIBIA FIBULA RIGHT W WO CONTRAST  Result Date: 03/30/2021 CLINICAL DATA:  Right foot wound for several months. Reported outside  radiographs suspicious for osteomyelitis. History of left lower extremity below the knee amputation. EXAM: MRI OF LOWER RIGHT EXTREMITY WITHOUT AND WITH CONTRAST TECHNIQUE: Multiplanar, multisequence MR imaging of the right lower leg was performed both before and after administration of intravenous contrast. CONTRAST:  135mGADAVIST GADOBUTROL 1 MMOL/ML IV SOLN COMPARISON:  Radiographs same date FINDINGS: Bones/Joint/Cartilage The right tibia and fibula appear normal. Specifically, no evidence of osteomyelitis. No significant knee or ankle joint effusion or abnormal synovial enhancement. Ligaments Not relevant for exam/indication. Muscles and Tendons The ankle tendons appear intact without tenosynovitis. There is some fatty atrophy within the lower leg  musculature, especially inferiorly. No suspicious abnormal enhancement or focal fluid collection. Soft tissues Generalized subcutaneous edema and enhancement throughout the lower leg, greatest distally. No focal fluid collection, foreign body or soft tissue emphysema. IMPRESSION: 1. Generalized edema and enhancement throughout the subcutaneous tissues consistent with soft tissue infection (cellulitis). No focal fluid collection or suspicious muscular findings. 2. No evidence of osteomyelitis or septic joint in the lower leg. See separate examination of the right foot. Electronically Signed   By: Richardean Sale M.D.   On: 03/30/2021 15:25   US Venous Img Lower Unilateral Right  Result Date: 03/30/2021 CLINICAL DATA:  Right lower extremity pain and edema for the past 2 months. Patient is currently on anticoagulation. Evaluate for DVT. EXAM: RIGHT LOWER EXTREMITY VENOUS DOPPLER ULTRASOUND TECHNIQUE: Gray-scale sonography with graded compression, as well as color Doppler and duplex ultrasound were performed to evaluate the lower extremity deep venous systems from the level of the common femoral vein and including the common femoral, femoral, profunda femoral,  popliteal and calf veins including the posterior tibial, peroneal and gastrocnemius veins when visible. The superficial great saphenous vein was also interrogated. Spectral Doppler was utilized to evaluate flow at rest and with distal augmentation maneuvers in the common femoral, femoral and popliteal veins. COMPARISON:  None. FINDINGS: Contralateral Common Femoral Vein: Respiratory phasicity is normal and symmetric with the symptomatic side. No evidence of thrombus. Normal compressibility. Common Femoral Vein: No evidence of thrombus. Normal compressibility, respiratory phasicity and response to augmentation. Saphenofemoral Junction: No evidence of thrombus. Normal compressibility and flow on color Doppler imaging. Profunda Femoral Vein: No evidence of thrombus. Normal compressibility and flow on color Doppler imaging. Femoral Vein: No evidence of thrombus. Normal compressibility, respiratory phasicity and response to augmentation. Popliteal Vein: No evidence of thrombus. Normal compressibility, respiratory phasicity and response to augmentation. Calf Veins: No evidence of thrombus. Normal compressibility and flow on color Doppler imaging. Superficial Great Saphenous Vein: No evidence of thrombus. Normal compressibility. Venous Reflux:  None. Other Findings: There is a moderate amount of subcutaneous edema at the level of the right calf (images 33, 34 and 44). Note is made of a prominent though benign appearing right inguinal lymph node which is not enlarged by size criteria measuring 0.9 cm in greatest short axis diameter and maintains a benign fatty hilum, presumably reactive in etiology. IMPRESSION: No evidence of DVT within the right lower extremity. Electronically Signed   By: Sandi Mariscal M.D.   On: 03/30/2021 12:33   DG Foot Complete Right  Result Date: 03/30/2021 CLINICAL DATA:  Right foot wound for several months. Reported outside radiographs suspicious for osteomyelitis. History of left lower extremity  below the knee amputation. EXAM: RIGHT FOOT COMPLETE - 3+ VIEW COMPARISON:  Right foot radiographs 05/04/2014. FINDINGS: The bones are diffusely demineralized. There are new erosions and destruction of the 5th metatarsal head. The 2nd, 3rd and 4th metatarsal heads are also somewhat indistinct, but without suspicious corresponding finding on MRI performed today. There is generalized dorsal forefoot soft tissue swelling. Scattered vascular calcifications are noted. No evidence of soft tissue emphysema. 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  See separate MRI report. Electronically Signed   By: Richardean Sale M.D.   On: 03/30/2021 15:19     Assessment/Plan All diagnoses below were present on admission: Principal Problem:   Acute osteomyelitis of right foot ([place]) Active Problems:   Atherosclerosis of native arteries of extremity with intermittent claudication (HCC)   Type 2 diabetes mellitus with diabetic peripheral  angiopathy without gangrene ([place])   Essential hypertension   Hyperlipidemia   Status post below-knee amputation ([place])    Principal Problem: Acute osteomyelitis of right foot Plan: Did have outpatient management but symptoms worsened. IV antibiotics; will begin with IV vancomycin and IV ceftriaxone. IV morphine as needed for pain. Podiatry consult.  Active Problems: Atherosclerosis of native arteries of extremity with intermittent claudication  Plan: IV morphine as needed for pain.  He is followed outpatient at a vein clinic.  Continue Eliquis unless any procedure is planning. Will need vascular surgery evaluation but unclear if this will be done as an inpatient; will defer decision until podiatry has made recommendations.  Type 2 diabetes mellitus with diabetic peripheral angiopathy without gangrene  Plan: Hold oral diabetes medications.  Check fingerstick blood sugars q ac and hs.  Sliding scale insulin.   Low-dose Lantus insulin.  Essential  hypertension Plan: Continue home medications.  Hyperlipidemia. Plan: Statin.  Status post below-knee amputation on left. Plan: Noted. Will need assistance.    DVT prophylaxis: Eliquis, which should be changed to Lovenox or heparin if any procedures are planned.   Code Status:   Full Code Family Communication:   Despina Arias Sister [place]     [address] Austin [name] [phone]   [phone]     Disposition Plan:   Patient is from:  Home  Anticipated DC to:  Home  Anticipated DC date:  04/03/2021  Anticipated DC barriers: None  Consults called:  Podiatry: Dr. Vickki Muff Admission status:  Inpatient   Severity of Illness: The appropriate patient status for this patient is INPATIENT. Inpatient status is judged to be reasonable and necessary in order to provide the required intensity of service to ensure the patient's safety. [name] presenting symptoms, physical exam findings, and initial radiographic and laboratory data in the context of their chronic comorbidities is felt to place them at high risk for further clinical deterioration. Furthermore, it is not anticipated that the patient will be medically stable for discharge from the hospital within 2 midnights of admission. The following factors support the patient status of inpatient.   " [name] presenting symptoms include pain in foot. " The worrisome physical exam findings include decreased pulse, erythema. " The initial radiographic and laboratory data are worrisome because of concern for acute osteomyelitis. " The chronic co-morbidities include diabetes with chronic foot wound, peripheral arterial disease, history of left-sided amputation.   * I certify that at the point of admission it is my clinical judgment that the patient will require inpatient hospital care spanning beyond 2 midnights from the point of admission due to high intensity of service, high risk for further deterioration and  high frequency of surveillance required.Tacey Ruiz MD [facility]  How to contact the [facility] Attending or Consulting provider [place] or covering provider during after hours [place], for this patient?   Check the care team in [facility] and look for a) attending/consulting [facility] provider listed and b) the [facility] team listed Log into [email or web address] and use [place]'s universal password to access. If you do not have the password, please contact the hospital operator. Locate the [facility] provider you are looking for under [facility] and page to a number that you can be directly reached. If you still have difficulty reaching the provider, please page the [facility] (Director on Call) for the Hospitalists listed on amion for assistance.  03/30/2021, 11:31 PM

## 2021-04-01 LAB — GLUCOSE, CAPILLARY
Glucose-Capillary: 203 mg/dL — ABNORMAL HIGH (ref 70–99)
Glucose-Capillary: 234 mg/dL — ABNORMAL HIGH (ref 70–99)
Glucose-Capillary: 189 mg/dL — ABNORMAL HIGH (ref 70–99)
Glucose-Capillary: 226 mg/dL — ABNORMAL HIGH (ref 70–99)

## 2021-04-01 LAB — BASIC METABOLIC PANEL
Anion gap: 5 (ref 5–15)
BUN: 9 mg/dL (ref 8–23)
CO2: 24 mmol/L (ref 22–32)
Calcium: 8.5 mg/dL — ABNORMAL LOW (ref 8.9–10.3)
Chloride: 110 mmol/L (ref 98–111)
Creatinine, Ser: 0.7 mg/dL (ref 0.61–1.24)
GFR, Estimated: >60 mL/min (ref >60)
Glucose, Bld: 205 mg/dL — ABNORMAL HIGH (ref 70–99)
Potassium: 3.8 mmol/L (ref 3.5–5.1)
Sodium: 139 mmol/L (ref 135–145)

## 2021-04-01 MED ORDER — APIXABAN 5 MG PO TABS
5.0000 mg | ORAL_TABLET | Freq: Two times a day (BID) | ORAL | Status: DC
  Administered 2021-04-01 – 2021-04-02 (×3): 5 mg via ORAL
  Filled 2021-04-01 (×2): qty 1

## 2021-04-01 MED ORDER — INSULIN GLARGINE 100 UNIT/ML ~~LOC~~ SOLN
25.0000 [IU] | Freq: Every day | SUBCUTANEOUS | Status: DC
  Administered 2021-04-01: 25 [IU] via SUBCUTANEOUS
  Filled 2021-04-01 (×2): qty 0.25

## 2021-04-01 NOTE — Evaluation (Signed)
Physical Therapy Evaluation Patient Details Name: Austin [name] MRN: [id number] DOB: [birth year] Today's Date: 04/01/2021   History of Present Illness  Austin [name] is a 65 y.o. male with medical history significant for diabetes, PVD, left below the knee amputation, history of diabetic ulcers/wounds, history of DVT, HTN, HLD who presents to the emergency department on 03/30/2021 with worsening pain of his right foot.  Patient first developed ulcer and pain of right foot approximately 6 months prior to admission, but symptoms have worsened in the past 1 to 2 weeks and have been constantly present. Now diagnosed with acute osteomyelitis of the R foot.  Clinical Impression  The pt presents this session with no complaints of increased RLE pain. He states that his edema has significantly decreased and therefore, his pain has subsided. The pt is able to independently donn L prosthetic without verbal cues. He requires Mod A for standing to the RW d/t static and dynamic gait deficits. The pt reports mobilizing with w/c mostly at baseline, however has a goal to improve gait. He presents with gait, balance, strength, and mobility deficits. PT will continue to follow.     Follow Up Recommendations Home health PT;Supervision for mobility/OOB (May benefit from OPPT, if he has transportation)    Administrator, arts walker with 5" wheels (Bariatric (wide))    Recommendations for Other Services       Precautions / Restrictions Precautions Precautions: Fall Required Braces or Orthoses: Other Brace Other Brace: LLE prosthetic Restrictions Weight Bearing Restrictions: Yes RLE Weight Bearing: Weight bearing as tolerated      Mobility  Bed Mobility Overal bed mobility: Needs Assistance Bed Mobility: Supine to Sit     Supine to sit: Min assist          Transfers Overall transfer level: Needs assistance Equipment used: Rolling walker (2 wheeled) (L prosthetic limb  donned) Transfers: Sit to/from Stand Sit to Stand: Mod assist;Min assist;From elevated surface         General transfer comment: HOB signficantly elevated, pt demonstrating poor initial standing balance requiring increased assitance for steadying.  Ambulation/Gait Ambulation/Gait assistance: Min assist Gait Distance (Feet): 30 Feet Assistive device: Rolling walker (2 wheeled) Gait Pattern/deviations: Trunk flexed;Wide base of support;Step-to pattern;Decreased step length - right;Decreased weight shift to right;Decreased dorsiflexion - right;Decreased stance time - right   Gait velocity interpretation: <1.31 ft/sec, indicative of household ambulator General Gait Details: Pt demonstrates decreased step legnth on the R that requires minimal cueing for safety. The pt also demonstrates hitting his R foot on the RW during gait d/t the wide BOS. Pt given Bari-RW for in house use for safety.  Stairs            Wheelchair Mobility    Modified Rankin (Stroke Patients Only)       Balance Overall balance assessment: Needs assistance Sitting-balance support: No upper extremity supported;Feet unsupported Sitting balance-Leahy Scale: Good     Standing balance support: During functional activity;Bilateral upper extremity supported Standing balance-Leahy Scale: Fair Standing balance comment: Pt requires assistance for static standing balance and balance with gait.                             Pertinent Vitals/Pain Pain Assessment: No/denies pain    Home Living Family/patient expects to be discharged to:: Private residence (friend's home) Living Arrangements: Non-relatives/Friends Available Help at Discharge: Available PRN/intermittently;Friend(s) Type of Home: House Home Access: Stairs to enter Entrance Stairs-Rails:  None Entrance Stairs-Number of Steps: 2 Home Layout: One level Home Equipment: Walker - 2 wheels;Wheelchair - manual      Prior Function Level of  Independence: Independent with assistive device(s)         Comments: Pt reports mostly using w/c for mobility prior to admission. Intermittently using RW for gait.     Hand Dominance   Dominant Hand: Right    Extremity/Trunk Assessment   Upper Extremity Assessment Upper Extremity Assessment: Overall WFL for tasks assessed    Lower Extremity Assessment Lower Extremity Assessment: Generalized weakness;RLE deficits/detail;LLE deficits/detail RLE Deficits / Details: Limited R foot ROM 2/2 pain. RLE Sensation: decreased light touch LLE Deficits / Details: Good mobility of residual limb    Cervical / Trunk Assessment Cervical / Trunk Assessment: Kyphotic  Communication   Communication: No difficulties  Cognition Arousal/Alertness: Awake/alert Behavior During Therapy: WFL for tasks assessed/performed Overall Cognitive Status: No family/caregiver present to determine baseline cognitive functioning                                        General Comments      Exercises     Assessment/Plan    PT Assessment Patient needs continued PT services  PT Problem List Decreased strength;Decreased mobility;Decreased range of motion;Decreased activity tolerance;Decreased balance;Decreased skin integrity       PT Treatment Interventions Therapeutic activities;Gait training;Therapeutic exercise;Stair training;Patient/family education;Functional mobility training;Balance training    PT Goals (Current goals can be found in the Care Plan section)  Acute Rehab PT Goals Patient Stated Goal: to walk better PT Goal Formulation: With patient Time For Goal Achievement: 04/15/21 Potential to Achieve Goals: Good    Frequency Min 2X/week   Barriers to discharge        Co-evaluation               AM-PAC PT "6 Clicks" Mobility  Outcome Measure Help needed turning from your back to your side while in a flat bed without using bedrails?: A Little Help needed moving  from lying on your back to sitting on the side of a flat bed without using bedrails?: A Little Help needed moving to and from a bed to a chair (including a wheelchair)?: A Little Help needed standing up from a chair using your arms (e.g., wheelchair or bedside chair)?: A Lot Help needed to walk in hospital room?: A Little Help needed climbing 3-5 steps with a railing? : A Lot 6 Click Score: 16    End of Session Equipment Utilized During Treatment: Gait belt (L prosthetic limb) Activity Tolerance: Patient tolerated treatment well;No increased pain Patient left: in chair;with chair alarm set;with bed alarm set Nurse Communication: Mobility status PT Visit Diagnosis: Unsteadiness on feet (R26.81);Muscle weakness (generalized) (M62.81);Difficulty in walking, not elsewhere classified (R26.2)    Time: 2637-8588 PT Time Calculation (min) (ACUTE ONLY): 31 min   Charges:   PT Evaluation $PT Eval Moderate Complexity: 1 Mod PT Treatments $Gait Training: 8-22 mins        3:53 PM, 04/01/21 Austin [name] PT, DPT Physical Therapist - [facility]   Austin [name] 04/01/2021, 3:50 PM

## 2021-04-01 NOTE — Progress Notes (Signed)
PROGRESS NOTE    Austin [name]  UMP:[id number] DOB: January 01, 1956 DOA: 03/30/2021 PCP: Preston Fleeting, MD  Outpatient Specialists: vascular surgery, podiatry    Brief Narrative:   Austin [name] is a 65 y.o. male with medical history significant for diabetes, peripheral vascular disease, left below the knee amputation, history of diabetic ulcers/wounds, history of DVT, hypertension, hyperlipidemia, who presents to the emergency department on 03/30/2021 with worsening pain of his right foot.  Patient first developed ulcer and pain of right foot approximately 6 months prior to admission, but symptoms have worsened in the past 1 to 2 weeks and have been constantly present.  Pain is located in the right foot and sometimes radiates to the lower aspect of his right lower leg. It is up to 10/10 at times and is characterized as sharp. Symptoms are alleviated by nothing and exacerbated by weightbearing. Associated symptoms: Swelling of right foot. Right foot is generally colder.  Patient has not noticed exudate.  No chest pain, palpitations, shortness of breath, coughing, abdominal pain, vomiting, diarrhea, or bloody stool.  Patient receives care for his right foot from his podiatrist and wound care.  He went to the clinic and there was concern about infection, so he was sent to the emergency department.   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 Hx left BKA. MRI findings consistent w/ osteo. No external lesion, no cellulitis. Foot is cool to touch. Podiatry has seen, declines intervention for now, advises vascular  input. Vascular has seen, also declines intervention, thinks perfusion currently adequate, pt had angiopgraphy 12/2020 - cont vanc/ceftriaxone - ID consult in the AM, tentative plan now is extended course abx to treat the osteomyelitis - PT/OT consults  # PAD - cont aspirin/statin - resume apixaban  # T2DM Glucose 200s - increase lantus to 25 qhs - cont SSI - hold home meds - would NOT continue jardiance at discharge given severe PAD, hx amputation, foot infection, etc.   DVT prophylaxis: apixaban Code Status: full Family Communication: none @ bedside  Level of care: Med-Surg Status is: Inpatient  Remains inpatient appropriate because:Inpatient level of care appropriate due to severity of illness  Dispo: The patient is from: Home              Anticipated d/c is to: Home              Patient currently is not medically stable to d/c.   Difficult to place patient No    Consultants:  Podiatry, vascular surgery  Procedures: none  Antimicrobials:  Vanc/ceftriaxone 6/24>    Subjective: This morning some foot pain which he says is chronic. No chest or abd pain, has appetite  Objective: Vitals:   03/31/21 2326 04/01/21 0534 04/01/21 0749 04/01/21 1159  BP: 99/84 133/74 134/69 140/87  Pulse: (!) 59 67 (!) 58 (!) 58  Resp: 18 18 19  (!) 21  Temp: 97.7 F (36.5 C) (!) 97.3 F (36.3 C) (!) 97.5 F (36.4 C) 97.8 F (36.6 C)  TempSrc: Oral Oral Oral Oral  SpO2: 98% 97% 100% 99%  Weight:      Height:  Intake/Output Summary (Last 24 hours) at 04/01/2021 1235 Last data filed at 04/01/2021 1019 Gross per 24 hour  Intake 2278.29 ml  Output 1440 ml  Net 838.29 ml   Filed Weights   03/30/21 0954  Weight: 111.1 kg    Examination:  General exam: Appears calm and comfortable  Respiratory system: Clear to auscultation. Respiratory effort normal. Cardiovascular system: S1 & S2 heard, RRR. Soft systolic murmur. Trace LE edema Gastrointestinal system: Abdomen is  nondistended, soft and nontender. No organomegaly or masses felt. Normal bowel sounds heard. Central nervous system: Alert and oriented. No focal neurological deficits. Extremities: Symmetric 5 x 5 power. Skin: no ulcers. Left bka Psychiatry: Judgement and insight appear normal. Mood & affect appropriate.     Data Reviewed: I have personally reviewed following labs and imaging studies  CBC: Recent Labs  Lab 03/30/21 0958 03/31/21 0440  WBC 8.2 8.4  NEUTROABS 4.8  --   HGB 15.3 13.9  HCT 45.7 41.3  MCV 81.0 81.6  PLT 231 199   Basic Metabolic Panel: Recent Labs  Lab 03/30/21 0958 03/31/21 0440 04/01/21 0422  NA 139 138 139  K 3.7 3.8 3.8  CL 105 105 110  CO2 25 27 24   GLUCOSE 126* 204* 205*  BUN 10 10 9   CREATININE 1.07 0.78 0.70  CALCIUM 9.1 8.8* 8.5*   GFR: Estimated Creatinine Clearance: 125.7 mL/min (by C-G formula based on SCr of 0.7 mg/dL). Liver Function Tests: Recent Labs  Lab 03/30/21 0958  AST 24  ALT 21  ALKPHOS 62  BILITOT 0.8  PROT 7.4  ALBUMIN 3.6   No results for input(s): LIPASE, AMYLASE in the last 168 hours. No results for input(s): AMMONIA in the last 168 hours. Coagulation Profile: Recent Labs  Lab 03/31/21 0440  INR 1.2   Cardiac Enzymes: No results for input(s): CKTOTAL, CKMB, CKMBINDEX, TROPONINI in the last 168 hours. BNP (last 3 results) No results for input(s): PROBNP in the last 8760 hours. HbA1C: No results for input(s): HGBA1C in the last 72 hours. CBG: Recent Labs  Lab 03/31/21 1241 03/31/21 1654 03/31/21 2022 04/01/21 0754 04/01/21 1155  GLUCAP 145* 165* 207* 203* 234*   Lipid Profile: No results for input(s): CHOL, HDL, LDLCALC, TRIG, CHOLHDL, LDLDIRECT in the last 72 hours. Thyroid Function Tests: No results for input(s): TSH, T4TOTAL, FREET4, T3FREE, THYROIDAB in the last 72 hours. Anemia Panel: No results for input(s): VITAMINB12, FOLATE, FERRITIN, TIBC, IRON, RETICCTPCT in the last 72 hours. Urine  analysis:    Component Value Date/Time   COLORURINE YELLOW (A) 03/30/2021 0958   APPEARANCEUR CLEAR (A) 03/30/2021 0958   APPEARANCEUR Cloudy (A) 11/20/2020 1611   LABSPEC 1.027 03/30/2021 0958   LABSPEC 1.024 11/12/2013 2001   PHURINE 5.0 03/30/2021 0958   GLUCOSEU >=500 (A) 03/30/2021 0958   GLUCOSEU >=500 11/12/2013 2001   HGBUR NEGATIVE 03/30/2021 0958   BILIRUBINUR NEGATIVE 03/30/2021 0958   BILIRUBINUR Negative 11/20/2020 1611   BILIRUBINUR Negative 11/12/2013 2001   KETONESUR NEGATIVE 03/30/2021 0958   PROTEINUR NEGATIVE 03/30/2021 0958   NITRITE NEGATIVE 03/30/2021 0958   LEUKOCYTESUR NEGATIVE 03/30/2021 0958   LEUKOCYTESUR Negative 11/12/2013 2001   Sepsis Labs: @LABRCNTIP (procalcitonin:4,lacticidven:4)  ) Recent Results (from the past 240 hour(s))  Resp Panel by RT-PCR (Flu A&B, Covid) Nasopharyngeal Swab     Status: None   Collection Time: 03/30/21  4:00 PM   Specimen: Nasopharyngeal Swab; Nasopharyngeal(NP) swabs in vial transport medium  Result Value Ref Range Status   SARS Coronavirus 2  by RT PCR NEGATIVE NEGATIVE Final    Comment: (NOTE) SARS-CoV-2 target nucleic acids are NOT DETECTED.  The SARS-CoV-2 RNA is generally detectable in upper respiratory specimens during the acute phase of infection. The lowest concentration of SARS-CoV-2 viral copies this assay can detect is 138 copies/mL. A negative result does not preclude SARS-Cov-2 infection and should not be used as the sole basis for treatment or other patient management decisions. A negative result may occur with  improper specimen collection/handling, submission of specimen other than nasopharyngeal swab, presence of viral mutation(s) within the areas targeted by this assay, and inadequate number of viral copies(<138 copies/mL). A negative result must be combined with clinical observations, patient history, and epidemiological information. The expected result is Negative.  Fact Sheet for Patients:   BloggerCourse.com  Fact Sheet for Healthcare Providers:  SeriousBroker.it  This test is no t yet approved or cleared by the [place] FDA and  has been authorized for detection and/or diagnosis of SARS-CoV-2 by FDA under an Emergency Use Authorization (EUA). This EUA will remain  in effect (meaning this test can be used) for the duration of the COVID-19 declaration under Section 564(b)(1) of the Act, 21 U.S.C.section 360bbb-3(b)(1), unless the authorization is terminated  or revoked sooner.       Influenza A by PCR NEGATIVE NEGATIVE Final   Influenza B by PCR NEGATIVE NEGATIVE Final    Comment: (NOTE) The Xpert Xpress SARS-CoV-2/FLU/RSV plus assay is intended as an aid in the diagnosis of influenza from Nasopharyngeal swab specimens and should not be used as a sole basis for treatment. Nasal washings and aspirates are unacceptable for Xpert Xpress SARS-CoV-2/FLU/RSV testing.  Fact Sheet for Patients: BloggerCourse.com  Fact Sheet for Healthcare Providers: SeriousBroker.it  This test is not yet approved or cleared by the [place] FDA and has been authorized for detection and/or diagnosis of SARS-CoV-2 by FDA under an Emergency Use Authorization (EUA). This EUA will remain in effect (meaning this test can be used) for the duration of the COVID-19 declaration under Section 564(b)(1) of the Act, 21 U.S.C. section 360bbb-3(b)(1), unless the authorization is terminated or revoked.  Performed at [facility], [address] [place], [place] [address]   Blood culture (routine x 2)     Status: None (Preliminary result)   Collection Time: 03/30/21  4:26 PM   Specimen: BLOOD  Result Value Ref Range Status   Specimen Description BLOOD BLOOD RIGHT WRIST  Final   Special Requests   Final    BOTTLES DRAWN AEROBIC AND ANAEROBIC Blood Culture results may not be optimal  due to an inadequate volume of blood received in culture bottles   Culture   Final    NO GROWTH 2 DAYS Performed at [facility], [address] [place], [place] [address]    Report Status PENDING  Incomplete  Blood culture (routine x 2)     Status: None (Preliminary result)   Collection Time: 03/30/21  4:39 PM   Specimen: BLOOD  Result Value Ref Range Status   Specimen Description BLOOD LEFT ANTECUBITAL  Final   Special Requests   Final    BOTTLES DRAWN AEROBIC ONLY Blood Culture results may not be optimal due to an inadequate volume of blood received in culture bottles   Culture   Final    NO GROWTH 2 DAYS Performed at [facility], [address] [place], [place] [address]    Report Status PENDING  Incomplete  Radiology Studies: DG Tibia/Fibula Right  Result Date: 03/30/2021 CLINICAL DATA:  Right foot wound for several months. Reported outside radiographs suspicious for osteomyelitis. History of left lower extremity below the knee amputation. EXAM: RIGHT TIBIA AND FIBULA - 2 VIEW COMPARISON:  None. FINDINGS: The bones appear adequately mineralized. No evidence of acute fracture, dislocation or bone destruction. Mild tibiotalar degenerative changes. No evidence of foreign body or soft tissue emphysema. IMPRESSION: No acute or suspicious findings within the right lower leg. Electronically Signed   By: Carey Bullocks M.D.   On: 03/30/2021 15:20   MR FOOT RIGHT W WO CONTRAST  Result Date: 03/30/2021 CLINICAL DATA:  Right foot wound for several months. Reported outside radiographs suspicious for osteomyelitis. History of left lower extremity below the knee amputation. EXAM: MRI OF THE RIGHT FOREFOOT WITHOUT AND WITH CONTRAST TECHNIQUE: Multiplanar, multisequence MR imaging of the right foot was performed before and after the administration of intravenous contrast. CONTRAST:  10mL GADAVIST GADOBUTROL 1 MMOL/ML IV SOLN COMPARISON:  Radiographs today and  05/04/2014. FINDINGS: Bones/Joint/Cartilage As demonstrated on radiographs, there are erosions and destruction of the 5th metatarsal head. There is associated marrow edema and enhancement consistent with osteomyelitis. There are minimal marrow changes in the adjacent base of the 5th proximal phalanx. The additional metatarsals and toes appear normal. No significant joint effusions. Ligaments Intact Lisfranc ligament. Muscles and Tendons The forefoot tendons appear intact without significant tenosynovitis. Generalized forefoot muscular atrophy with mild T2 hyperintensity, but no abnormal enhancement. Soft tissues Soft tissue ulceration plantar to the 5th metatarsal head with diffuse soft tissue enhancement following contrast. No focal fluid collection. Focus of susceptibility artifact more centrally in the plantar forefoot is likely due to an incidental metallic foreign body, not apparent on radiographs. Generalized subcutaneous edema throughout the dorsal forefoot without focal collection or abnormal enhancement. IMPRESSION: 1. Soft tissue ulceration plantar to the 5th metatarsal head with underlying destruction of the 5th metatarsal head consistent with osteomyelitis. 2. No drainable abscess. 3. Generalized dorsal forefoot subcutaneous edema. Electronically Signed   By: Carey Bullocks M.D.   On: 03/30/2021 15:17   MR TIBIA FIBULA RIGHT W WO CONTRAST  Result Date: 03/30/2021 CLINICAL DATA:  Right foot wound for several months. Reported outside radiographs suspicious for osteomyelitis. History of left lower extremity below the knee amputation. EXAM: MRI OF LOWER RIGHT EXTREMITY WITHOUT AND WITH CONTRAST TECHNIQUE: Multiplanar, multisequence MR imaging of the right lower leg was performed both before and after administration of intravenous contrast. CONTRAST:  10mL GADAVIST GADOBUTROL 1 MMOL/ML IV SOLN COMPARISON:  Radiographs same date FINDINGS: Bones/Joint/Cartilage The right tibia and fibula appear normal.  Specifically, no evidence of osteomyelitis. No significant knee or ankle joint effusion or abnormal synovial enhancement. Ligaments Not relevant for exam/indication. Muscles and Tendons The ankle tendons appear intact without tenosynovitis. There is some fatty atrophy within the lower leg musculature, especially inferiorly. No suspicious abnormal enhancement or focal fluid collection. Soft tissues Generalized subcutaneous edema and enhancement throughout the lower leg, greatest distally. No focal fluid collection, foreign body or soft tissue emphysema. IMPRESSION: 1. Generalized edema and enhancement throughout the subcutaneous tissues consistent with soft tissue infection (cellulitis). No focal fluid collection or suspicious muscular findings. 2. No evidence of osteomyelitis or septic joint in the lower leg. See separate examination of the right foot. Electronically Signed   By: Carey Bullocks M.D.   On: 03/30/2021 15:25   US ARTERIAL LOWER EXTREMITY DUPLEX RIGHT(NON-ABI)  Result Date: 03/31/2021 CLINICAL DATA:  65 year old male with  a history of osteomyelitis EXAM: RIGHT LOWER EXTREMITY ARTERIAL DUPLEX SCAN TECHNIQUE: Gray-scale sonography as well as color Doppler and duplex ultrasound was performed to evaluate the lower extremity arteries including the common, superficial and profunda femoral arteries, popliteal artery and calf arteries. COMPARISON:  None. FINDINGS: Right lower Extremity ABI: Not calculated Directed duplex of the right lower extremity demonstrates monophasic waveforms of the femoropopliteal segment and the tibial arteries. IMPRESSION: Directed duplex of the right lower extremity demonstrates monophasic waveform of the femoropopliteal segment and the tibial arteries. Signed, Yvone Neu. Reyne Dumas, RPVI [facility] Electronically Signed   By: Gilmer Mor D.O.   On: 03/31/2021 11:26   DG Foot Complete Right  Result Date:  03/30/2021 CLINICAL DATA:  Right foot wound for several months. Reported outside radiographs suspicious for osteomyelitis. History of left lower extremity below the knee amputation. EXAM: RIGHT FOOT COMPLETE - 3+ VIEW COMPARISON:  Right foot radiographs 05/04/2014. FINDINGS: The bones are diffusely demineralized. There are new erosions and destruction of the 5th metatarsal head. The 2nd, 3rd and 4th metatarsal heads are also somewhat indistinct, but without suspicious corresponding finding on MRI performed today. There is generalized dorsal forefoot soft tissue swelling. Scattered vascular calcifications are noted. No evidence of soft tissue emphysema. 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  See separate MRI report. Electronically Signed   By: Carey Bullocks M.D.   On: 03/30/2021 15:19        Scheduled Meds:  aspirin EC  81 mg Oral Daily   atorvastatin  80 mg Oral q1800   docusate sodium  100 mg Oral BID   insulin aspart  0-15 Units Subcutaneous TID WC   insulin aspart  4 Units Subcutaneous TID WC   insulin glargine  20 Units Subcutaneous QHS   losartan  25 mg Oral Daily   Continuous Infusions:  sodium chloride 100 mL/hr at 04/01/21 1000   cefTRIAXone (ROCEPHIN)  IV 2 g (03/31/21 1702)   vancomycin 1,500 mg (04/01/21 0444)     LOS: 2 days    Time spent: 25 min    Silvano Bilis, MD [facility]   If 7PM-7AM, please contact night-coverage [email or web address] Password [facility] 04/01/2021, 12:35 PM

## 2021-04-01 NOTE — Progress Notes (Signed)
IV access was lost. This nurse attempted x2 without success.  Team lead, Marchelle Folks attempted x2 as well without success. IV Team consult placed. Patient is scheduled for 2 antibiotics at 5pm which can not be initiated until IV access reobtained.

## 2021-04-01 NOTE — Progress Notes (Signed)
ANTICOAGULATION CONSULT NOTE - Initial Consult  Pharmacy Consult for Apixaban Indication:  PAD  No Known Allergies  Patient Measurements: Height: 6\' 4"  (193 cm) Weight: 111.1 kg (245 lb) IBW/kg (Calculated) : 86.8  Vital Signs: Temp: 97.8 F (36.6 C) (06/26 1159) Temp Source: Oral (06/26 1159) BP: 140/87 (06/26 1159) Pulse Rate: 58 (06/26 1159)  Labs: Recent Labs    03/30/21 0958 03/31/21 0440 04/01/21 0422  HGB 15.3 13.9  --   HCT 45.7 41.3  --   PLT 231 199  --   APTT  --  33  --   LABPROT  --  14.8  --   INR  --  1.2  --   CREATININE 1.07 0.78 0.70    Estimated Creatinine Clearance: 125.7 mL/min (by C-G formula based on SCr of 0.7 mg/dL).   Medical History: Past Medical History:  Diagnosis Date   Coronary artery disease    Diabetes (HCC)    DVT (deep venous thrombosis) (HCC)    GERD (gastroesophageal reflux disease)    Hyperlipidemia    Hypertension    Peripheral vascular disease (HCC)     Medications:  PTA Apixaban 5 mg BID  Assessment: 65 y.o. male with medical history significant for diabetes, peripheral vascular disease, left below the knee amputation, history of diabetic ulcers/wounds, history of DVT, hypertension, hyperlipidemia, who presents to the emergency department on 03/30/2021 with worsening pain of his right foot. Pt was on apixaban 5 mg BID prior to admission. Pharmacy has been consulted for apixaban dosing.   Goal of Therapy:  Monitor platelets by anticoagulation protocol: Yes   Plan:  Will restart patient home dose: Apixaban 5 mg BID  Monitor CBC per protocol  04/01/2021, PharmD 04/01/2021,12:50 PM

## 2021-04-01 NOTE — Plan of Care (Signed)
Pt received resting comfortably in bed alert and oriented*4 nil complaints voiced.

## 2021-04-02 DIAGNOSIS — M86671 Other chronic osteomyelitis, right ankle and foot: Secondary | ICD-10-CM

## 2021-04-02 LAB — BASIC METABOLIC PANEL
Anion gap: 6 (ref 5–15)
BUN: 8 mg/dL (ref 8–23)
CO2: 23 mmol/L (ref 22–32)
Calcium: 8.8 mg/dL — ABNORMAL LOW (ref 8.9–10.3)
Chloride: 110 mmol/L (ref 98–111)
Creatinine, Ser: 0.71 mg/dL (ref 0.61–1.24)
GFR, Estimated: >60 mL/min (ref >60)
Glucose, Bld: 130 mg/dL — ABNORMAL HIGH (ref 70–99)
Potassium: 3.8 mmol/L (ref 3.5–5.1)
Sodium: 139 mmol/L (ref 135–145)

## 2021-04-02 LAB — GLUCOSE, CAPILLARY
Glucose-Capillary: 123 mg/dL — ABNORMAL HIGH (ref 70–99)
Glucose-Capillary: 125 mg/dL — ABNORMAL HIGH (ref 70–99)

## 2021-04-02 NOTE — Discharge Summary (Signed)
Austin [name] [id number] DOB: Aug 10, 1956 DOA: 03/30/2021  PCP: Theotis Burrow, MD  Admit date: 03/30/2021 Discharge date: 04/02/2021  Time spent: 35 minutes  Recommendations for Outpatient Follow-up:  Podiatry f/u 1-2 weeks     Discharge Diagnoses:  Principal Problem:   Acute osteomyelitis of right foot ([place]) Active Problems:   Atherosclerosis of native arteries of extremity with intermittent claudication ([place])   Essential hypertension   Hyperlipidemia   Type 2 diabetes mellitus with diabetic peripheral angiopathy without gangrene ([place])   Status post below-knee amputation [facility])   Discharge Condition: fair  Diet recommendation: heart healthy  Filed Weights   03/30/21 0954  Weight: 111.1 kg    History of present illness:  Austin [name] is a 65 y.o. male with medical history significant for diabetes, peripheral vascular disease, left below the knee amputation, history of diabetic ulcers/wounds, history of DVT, hypertension, hyperlipidemia, who presents to the emergency department on 03/30/2021 with worsening pain of his right foot.  Patient first developed ulcer and pain of right foot approximately 6 months prior to admission, but symptoms have worsened in the past 1 to 2 weeks and have been constantly present.  Pain is located in the right foot and sometimes radiates to the lower aspect of his right lower leg. It is up to 10/10 at times and is characterized as sharp. Symptoms are alleviated by nothing and exacerbated by weightbearing. Associated symptoms: Swelling of right foot. Right foot is generally colder.  Patient has not noticed exudate.  No chest pain, palpitations, shortness of breath, coughing, abdominal pain, vomiting, diarrhea, or bloody stool.  Patient receives care for his right foot from his podiatrist and wound care.  He went to the clinic and there was concern about infection, so he was sent to the emergency department.  Hospital Course:  Patient  presented with right foot pain. MRI showed evidence of osteomyelitis right 5th metatarsal. Podiatry and vascular surgery and ID consulted. Podiatry advised no intervention. Vascular surgery advised no intervention. ID opined no active osteo given healed ulcer in that location, normal sedimentation rate. Antibiotics were stopped. Plan for off-loading of the area, close f/u with podiatry in 1-2 weeks. Given history amputation and lower extremity infections I advised the patient hold his jardiance indefinitely.   Procedures: none   Consultations: Vascular surgery, podiatry, infectious disease  Discharge Exam: Vitals:   04/02/21 0546 04/02/21 0745  BP: 133/85 125/65  Pulse: 60 (!) 58  Resp: 18 18  Temp: 97.7 F (36.5 C) (!) 97.4 F (36.3 C)  SpO2: 99% 100%    General exam: Appears calm and comfortable Respiratory system: Clear to auscultation. Respiratory effort normal. Cardiovascular system: S1 & S2 heard, RRR. Soft systolic murmur. Trace LE edema Gastrointestinal system: Abdomen is nondistended, soft and nontender. No organomegaly or masses felt. Normal bowel sounds heard. Central nervous system: Alert and oriented. No focal neurological deficits. Extremities: Symmetric 5 x 5 power. Skin: no ulcers. Left bka Psychiatry: Judgement and insight appear normal. Mood & affect appropriate.  Discharge Instructions   Discharge Instructions     Change dressing (specify)   Complete by: As directed    Dressing change: daily   Diet - low sodium heart healthy   Complete by: As directed    Increase activity slowly   Complete by: As directed       Allergies as of 04/02/2021   No Known Allergies      Medication List     STOP taking these medications  empagliflozin 25 MG Tabs tablet Commonly known as: JARDIANCE       TAKE these medications    Accu-Chek Guide test strip Generic drug: glucose blood   Accu-Chek Guide w/Device Kit USE 1 KIT AS DIRECTED FOUR TIMES A DAY    apixaban 5 MG Tabs tablet Commonly known as: ELIQUIS Take 1 tablet (5 mg total) by mouth 2 (two) times daily.   aspirin EC 81 MG tablet Take 1 tablet (81 mg total) by mouth daily.   atorvastatin 80 MG tablet Commonly known as: LIPITOR Take 1 tablet (80 mg total) by mouth daily at 6 PM.   DSS 100 MG Caps Take 100 mg by mouth in the morning and at bedtime.   HumaLOG KwikPen 100 UNIT/ML KwikPen Generic drug: insulin lispro Inject 24 Units into the skin 3 (three) times daily before meals.   insulin aspart 100 UNIT/ML injection Commonly known as: novoLOG Inject 10 Units into the skin 3 (three) times daily with meals.   Toujeo SoloStar 300 UNIT/ML Solostar Pen Generic drug: insulin glargine (1 Unit Dial) Inject 24 Units into the skin at bedtime.   insulin glargine 100 UNIT/ML injection Commonly known as: LANTUS Inject 0.3 mLs (30 Units total) into the skin at bedtime.   losartan 25 MG tablet Commonly known as: COZAAR Take 25 mg by mouth daily.   polyethylene glycol 17 g packet Commonly known as: MiraLax Take 17 g by mouth daily.   TRUEplus 5-Bevel Pen Needles 31G X 6 MM Misc Generic drug: Insulin Pen Needle               Durable Medical Equipment  (From admission, onward)           Start     Ordered   04/02/21 1056  For home use only DME Walker rolling  Once       Comments: For patient 6 ft 4 inches tall  Question Answer Comment  Walker: With 5 Inch Wheels   Patient needs a walker to treat with the following condition Weakness      04/02/21 1057              Discharge Care Instructions  (From admission, onward)           Start     Ordered   04/02/21 0000  Change dressing (specify)       Comments: Dressing change: daily   04/02/21 1114           No Known Allergies    The results of significant diagnostics from this hospitalization (including imaging, microbiology, ancillary and laboratory) are listed below for reference.     Significant Diagnostic Studies: DG Tibia/Fibula Right  Result Date: 03/30/2021 CLINICAL DATA:  Right foot wound for several months. Reported outside radiographs suspicious for osteomyelitis. History of left lower extremity below the knee amputation. EXAM: RIGHT TIBIA AND FIBULA - 2 VIEW COMPARISON:  None. FINDINGS: The bones appear adequately mineralized. No evidence of acute fracture, dislocation or bone destruction. Mild tibiotalar degenerative changes. No evidence of foreign body or soft tissue emphysema. IMPRESSION: No acute or suspicious findings within the right lower leg. Electronically Signed   By: Richardean Sale M.D.   On: 03/30/2021 15:20   MR FOOT RIGHT W WO CONTRAST  Result Date: 03/30/2021 CLINICAL DATA:  Right foot wound for several months. Reported outside radiographs suspicious for osteomyelitis. History of left lower extremity below the knee amputation. EXAM: MRI OF THE RIGHT FOREFOOT WITHOUT AND WITH CONTRAST TECHNIQUE:  Multiplanar, multisequence MR imaging of the right foot was performed before and after the administration of intravenous contrast. CONTRAST:  88m GADAVIST GADOBUTROL 1 MMOL/ML IV SOLN COMPARISON:  Radiographs today and 05/04/2014. FINDINGS: Bones/Joint/Cartilage As demonstrated on radiographs, there are erosions and destruction of the 5th metatarsal head. There is associated marrow edema and enhancement consistent with osteomyelitis. There are minimal marrow changes in the adjacent base of the 5th proximal phalanx. The additional metatarsals and toes appear normal. No significant joint effusions. Ligaments Intact Lisfranc ligament. Muscles and Tendons The forefoot tendons appear intact without significant tenosynovitis. Generalized forefoot muscular atrophy with mild T2 hyperintensity, but no abnormal enhancement. Soft tissues Soft tissue ulceration plantar to the 5th metatarsal head with diffuse soft tissue enhancement following contrast. No focal fluid collection.  Focus of susceptibility artifact more centrally in the plantar forefoot is likely due to an incidental metallic foreign body, not apparent on radiographs. Generalized subcutaneous edema throughout the dorsal forefoot without focal collection or abnormal enhancement. IMPRESSION: 1. Soft tissue ulceration plantar to the 5th metatarsal head with underlying destruction of the 5th metatarsal head consistent with osteomyelitis. 2. No drainable abscess. 3. Generalized dorsal forefoot subcutaneous edema. Electronically Signed   By: WRichardean SaleM.D.   On: 03/30/2021 15:17   MR TIBIA FIBULA RIGHT W WO CONTRAST  Result Date: 03/30/2021 CLINICAL DATA:  Right foot wound for several months. Reported outside radiographs suspicious for osteomyelitis. History of left lower extremity below the knee amputation. EXAM: MRI OF LOWER RIGHT EXTREMITY WITHOUT AND WITH CONTRAST TECHNIQUE: Multiplanar, multisequence MR imaging of the right lower leg was performed both before and after administration of intravenous contrast. CONTRAST:  14mGADAVIST GADOBUTROL 1 MMOL/ML IV SOLN COMPARISON:  Radiographs same date FINDINGS: Bones/Joint/Cartilage The right tibia and fibula appear normal. Specifically, no evidence of osteomyelitis. No significant knee or ankle joint effusion or abnormal synovial enhancement. Ligaments Not relevant for exam/indication. Muscles and Tendons The ankle tendons appear intact without tenosynovitis. There is some fatty atrophy within the lower leg musculature, especially inferiorly. No suspicious abnormal enhancement or focal fluid collection. Soft tissues Generalized subcutaneous edema and enhancement throughout the lower leg, greatest distally. No focal fluid collection, foreign body or soft tissue emphysema. IMPRESSION: 1. Generalized edema and enhancement throughout the subcutaneous tissues consistent with soft tissue infection (cellulitis). No focal fluid collection or suspicious muscular findings. 2. No  evidence of osteomyelitis or septic joint in the lower leg. See separate examination of the right foot. Electronically Signed   By: WiRichardean Sale.D.   On: 03/30/2021 15:25   [place] Img Lower Unilateral Right  Result Date: 03/30/2021 CLINICAL DATA:  Right lower extremity pain and edema for the past 2 months. Patient is currently on anticoagulation. Evaluate for DVT. EXAM: RIGHT LOWER EXTREMITY VENOUS DOPPLER ULTRASOUND TECHNIQUE: Gray-scale sonography with graded compression, as well as color Doppler and duplex ultrasound were performed to evaluate the lower extremity deep venous systems from the level of the common femoral vein and including the common femoral, femoral, profunda femoral, popliteal and calf veins including the posterior tibial, peroneal and gastrocnemius veins when visible. The superficial great saphenous vein was also interrogated. Spectral Doppler was utilized to evaluate flow at rest and with distal augmentation maneuvers in the common femoral, femoral and popliteal veins. COMPARISON:  None. FINDINGS: Contralateral Common Femoral Vein: Respiratory phasicity is normal and symmetric with the symptomatic side. No evidence of thrombus. Normal compressibility. Common Femoral Vein: No evidence of thrombus. Normal compressibility, respiratory phasicity and response  to augmentation. Saphenofemoral Junction: No evidence of thrombus. Normal compressibility and flow on color Doppler imaging. Profunda Femoral Vein: No evidence of thrombus. Normal compressibility and flow on color Doppler imaging. Femoral Vein: No evidence of thrombus. Normal compressibility, respiratory phasicity and response to augmentation. Popliteal Vein: No evidence of thrombus. Normal compressibility, respiratory phasicity and response to augmentation. Calf Veins: No evidence of thrombus. Normal compressibility and flow on color Doppler imaging. Superficial Great Saphenous Vein: No evidence of thrombus. Normal compressibility.  Venous Reflux:  None. Other Findings: There is a moderate amount of subcutaneous edema at the level of the right calf (images 33, 34 and 44). Note is made of a prominent though benign appearing right inguinal lymph node which is not enlarged by size criteria measuring 0.9 cm in greatest short axis diameter and maintains a benign fatty hilum, presumably reactive in etiology. IMPRESSION: No evidence of DVT within the right lower extremity. Electronically Signed   By: Sandi Mariscal M.D.   On: 03/30/2021 12:33   US ARTERIAL LOWER EXTREMITY DUPLEX RIGHT(NON-ABI)  Result Date: 03/31/2021 CLINICAL DATA:  65 year old male with a history of osteomyelitis EXAM: RIGHT LOWER EXTREMITY ARTERIAL DUPLEX SCAN TECHNIQUE: Gray-scale sonography as well as color Doppler and duplex ultrasound was performed to evaluate the lower extremity arteries including the common, superficial and profunda femoral arteries, popliteal artery and calf arteries. COMPARISON:  None. FINDINGS: Right lower Extremity ABI: Not calculated Directed duplex of the right lower extremity demonstrates monophasic waveforms of the femoropopliteal segment and the tibial arteries. IMPRESSION: Directed duplex of the right lower extremity demonstrates monophasic waveform of the femoropopliteal segment and the tibial arteries. Signed, Dulcy Fanny. Dellia Nims, RPVI [facility] Radiology Electronically Signed   By: Corrie Mckusick D.O.   On: 03/31/2021 11:26   DG Foot Complete Right  Result Date: 03/30/2021 CLINICAL DATA:  Right foot wound for several months. Reported outside radiographs suspicious for osteomyelitis. History of left lower extremity below the knee amputation. EXAM: RIGHT FOOT COMPLETE - 3+ VIEW COMPARISON:  Right foot radiographs 05/04/2014. FINDINGS: The bones are diffusely demineralized. There are new erosions and destruction of the 5th metatarsal head. The 2nd, 3rd and 4th metatarsal heads are also somewhat  indistinct, but without suspicious corresponding finding on MRI performed today. There is generalized dorsal forefoot soft tissue swelling. Scattered vascular calcifications are noted. No evidence of soft tissue emphysema. 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  See separate MRI report. Electronically Signed   By: Richardean Sale M.D.   On: 03/30/2021 15:19    Microbiology: Recent Results (from the past 240 hour(s))  Resp Panel by RT-PCR (Flu A&B, Covid) Nasopharyngeal Swab     Status: None   Collection Time: 03/30/21  4:00 PM   Specimen: Nasopharyngeal Swab; Nasopharyngeal(NP) swabs in vial transport medium  Result Value Ref Range Status   SARS Coronavirus 2 by RT PCR NEGATIVE NEGATIVE Final    Comment: (NOTE) SARS-CoV-2 target nucleic acids are NOT DETECTED.  The SARS-CoV-2 RNA is generally detectable in upper respiratory specimens during the acute phase of infection. The lowest concentration of SARS-CoV-2 viral copies this assay can detect is 138 copies/mL. A negative result does not preclude SARS-Cov-2 infection and should not be used as the sole basis for treatment or other patient management decisions. A negative result may occur with  improper specimen collection/handling, submission of specimen other than nasopharyngeal swab, presence of viral mutation(s) within the areas targeted by this assay, and inadequate number of viral  copies(<138 copies/mL). A negative result must be combined with clinical observations, patient history, and epidemiological information. The expected result is Negative.  Fact Sheet for Patients:  EntrepreneurPulse.com.au  Fact Sheet for Healthcare Providers:  IncredibleEmployment.be  This test is no t yet approved or cleared by the [place] FDA and  has been authorized for detection and/or diagnosis of SARS-CoV-2 by FDA under an Emergency Use Authorization (EUA). This EUA will remain  in effect  (meaning this test can be used) for the duration of the COVID-19 declaration under Section 564(b)(1) of the Act, 21 U.S.C.section 360bbb-3(b)(1), unless the authorization is terminated  or revoked sooner.       Influenza A by PCR NEGATIVE NEGATIVE Final   Influenza B by PCR NEGATIVE NEGATIVE Final    Comment: (NOTE) The Xpert Xpress SARS-CoV-2/FLU/RSV plus assay is intended as an aid in the diagnosis of influenza from Nasopharyngeal swab specimens and should not be used as a sole basis for treatment. Nasal washings and aspirates are unacceptable for Xpert Xpress SARS-CoV-2/FLU/RSV testing.  Fact Sheet for Patients: EntrepreneurPulse.com.au  Fact Sheet for Healthcare Providers: IncredibleEmployment.be  This test is not yet approved or cleared by the [place] FDA and has been authorized for detection and/or diagnosis of SARS-CoV-2 by FDA under an Emergency Use Authorization (EUA). This EUA will remain in effect (meaning this test can be used) for the duration of the COVID-19 declaration under Section 564(b)(1) of the Act, 21 U.S.C. section 360bbb-3(b)(1), unless the authorization is terminated or revoked.  Performed at [facility], [address] [place], [place] [address]   Blood culture (routine x 2)     Status: None (Preliminary result)   Collection Time: 03/30/21  4:26 PM   Specimen: BLOOD  Result Value Ref Range Status   Specimen Description BLOOD BLOOD RIGHT WRIST  Final   Special Requests   Final    BOTTLES DRAWN AEROBIC AND ANAEROBIC Blood Culture results may not be optimal due to an inadequate volume of blood received in culture bottles   Culture   Final    NO GROWTH 3 DAYS Performed at [facility], [address], [place], [place] [address]    Report Status PENDING  Incomplete  Blood culture (routine x 2)     Status: None (Preliminary result)   Collection Time: 03/30/21  4:39 PM   Specimen: BLOOD   Result Value Ref Range Status   Specimen Description BLOOD LEFT ANTECUBITAL  Final   Special Requests   Final    BOTTLES DRAWN AEROBIC ONLY Blood Culture results may not be optimal due to an inadequate volume of blood received in culture bottles   Culture   Final    NO GROWTH 3 DAYS Performed at [facility], [address] [place], [place] [address]    Report Status PENDING  Incomplete     Labs: Basic Metabolic Panel: Recent Labs  Lab 03/30/21 0958 03/31/21 0440 04/01/21 0422 04/02/21 0557  NA 139 138 139 139  K 3.7 3.8 3.8 3.8  CL 105 105 110 110  CO2 _0 GLUCOSE 126* 204* 205* 130*  BUN _1 CREATININE 1.07 0.78 0.70 0.71  CALCIUM 9.1 8.8* 8.5* 8.8*   Liver Function Tests: Recent Labs  Lab 03/30/21 0958  AST 24  ALT 21  ALKPHOS 62  BILITOT 0.8  PROT 7.4  ALBUMIN 3.6   No results for input(s): LIPASE, AMYLASE in the last 168 hours. No results  for input(s): AMMONIA in the last 168 hours. CBC: Recent Labs  Lab 03/30/21 0958 03/31/21 0440  WBC 8.2 8.4  NEUTROABS 4.8  --   HGB 15.3 13.9  HCT 45.7 41.3  MCV 81.0 81.6  PLT 231 199   Cardiac Enzymes: No results for input(s): CKTOTAL, CKMB, CKMBINDEX, TROPONINI in the last 168 hours. BNP: BNP (last 3 results) Recent Labs    03/30/21 0958  BNP 67.6    ProBNP (last 3 results) No results for input(s): PROBNP in the last 8760 hours.  CBG: Recent Labs  Lab 04/01/21 0754 04/01/21 1155 04/01/21 1653 04/01/21 2132 04/02/21 0736  GLUCAP 203* 234* 189* 226* 123*       Signed:  Desma Maxim MD.  [facility] 04/02/2021, 11:14 AM

## 2021-04-02 NOTE — Care Management Important Message (Signed)
Important Message  Patient Details  Name: Austin [name] MRN: [id number] Date of Birth: 31-Jul-1956   Medicare Important Message Given:  Yes     [name] 04/02/2021, 11:35 AM

## 2021-04-02 NOTE — Consult Note (Signed)
NAME: Austin [name]  DOB: Aug 15, 1956  MRN: [id number]  Date/Time: 04/02/2021 10:35 AM  REQUESTING PROVIDER: Dr. Si Raider Subjective:  REASON FOR CONSULT: osteomyelitis ? Austin [name] is a 65 y.o. male with a history of diabetes mellitus, peripheral vascular disease, left BKA.  Presented to the ED on 03/30/2021 for an abnormal x-ray of the foot. Patient has had a chronic wound on the right lateral aspect of the foot and has been followed by podiatrist.  His last antibiotic was in February 2022.  His PCP did an x-ray on 03/28/2021 and that showed erosion and bony destruction of the fifth metatarsal head concerning for osteomyelitis and patient was referred to his podiatrist who sent him to the ED.  He had last seen the podiatrist Dr. Cleda Mccreedy on 02/27/2021 and there was a very superficial wound noted on the lateral aspect of the foot which had been debrided then. Patient did not have any fever or chills. His temperature was 97.8, WBC was 8.4. He was seen by podiatrist and the ulcer had healed completely and no surgical intervention was required. ESR was 3.  He was started on vancomycin and ceftriaxone.  He had been assessed by vascular surgeon who did not think he had an active vascular issue. Am asked to see the patient for antibiotic recommendation.   Past Medical History:  Diagnosis Date   Coronary artery disease    Diabetes ([place])    DVT (deep venous thrombosis) ([facility])    GERD (gastroesophageal reflux disease)    Hyperlipidemia    Hypertension    Peripheral vascular disease ([place])     Past Surgical History:  Procedure Laterality Date   AMPUTATION Left 10/28/2018   Procedure: AMPUTATION BELOW KNEE;  Surgeon: Algernon Huxley, MD;  Location: [facility];  Service: Vascular;  Laterality: Left;   AMPUTATION TOE Left 02/10/2018   Procedure: AMPUTATION TOE;  Surgeon: Sharlotte Alamo, DPM;  Location: [facility];  Service: Podiatry;  Laterality: Left;   APPLICATION OF WOUND VAC Left 10/16/2018   Procedure:  APPLICATION OF WOUND VAC;  Surgeon: Samara Deist, DPM;  Location: [facility];  Service: Podiatry;  Laterality: Left;   DORSAL SLIT N/A 01/12/2021   Procedure: DORSAL SLIT;  Surgeon: Billey Co, MD;  Location: [facility];  Service: Urology;  Laterality: N/A;   groin surgery     IRRIGATION AND DEBRIDEMENT FOOT Left 09/30/2018   Procedure: IRRIGATION AND DEBRIDEMENT FOOT;  Surgeon: Sharlotte Alamo, DPM;  Location: [facility];  Service: Podiatry;  Laterality: Left;   LOWER EXTREMITY ANGIOGRAPHY Left 02/12/2018   Procedure: Lower Extremity Angiography;  Surgeon: Algernon Huxley, MD;  Location: [place] [facility];  Service: Cardiovascular;  Laterality: Left;   LOWER EXTREMITY ANGIOGRAPHY Left 10/01/2018   Procedure: Lower Extremity Angiography;  Surgeon: Algernon Huxley, MD;  Location: [place] [facility];  Service: Cardiovascular;  Laterality: Left;   LOWER EXTREMITY ANGIOGRAPHY Left 10/19/2018   Procedure: Lower Extremity Angiography;  Surgeon: Algernon Huxley, MD;  Location: [place] [facility];  Service: Cardiovascular;  Laterality: Left;   LOWER EXTREMITY ANGIOGRAPHY Left 10/20/2018   Procedure: LOWER EXTREMITY ANGIOGRAPHY;  Surgeon: Algernon Huxley, MD;  Location: [place] [facility];  Service: Cardiovascular;  Laterality: Left;   LOWER EXTREMITY ANGIOGRAPHY Right 11/25/2018   Procedure: LOWER EXTREMITY ANGIOGRAPHY;  Surgeon: Algernon Huxley, MD;  Location: [place] [facility];  Service: Cardiovascular;  Laterality: Right;   LOWER EXTREMITY ANGIOGRAPHY Right 01/10/2020   Procedure: LOWER EXTREMITY ANGIOGRAPHY;  Surgeon: Algernon Huxley, MD;  Location: [place] [facility];  Service: Cardiovascular;  Laterality: Right;   LOWER EXTREMITY ANGIOGRAPHY Right 12/11/2020   Procedure: LOWER EXTREMITY ANGIOGRAPHY;  Surgeon: Algernon Huxley, MD;  Location: [place] [facility];  Service: Cardiovascular;  Laterality: Right;   LOWER EXTREMITY INTERVENTION  02/12/2018   Procedure: LOWER EXTREMITY INTERVENTION;  Surgeon: Algernon Huxley, MD;   Location: [place] [facility];  Service: Cardiovascular;;   NECK SURGERY     TRANSMETATARSAL AMPUTATION Left 10/16/2018   Procedure: TRANSMETATARSAL AMPUTATION LEFT FOOT;  Surgeon: Samara Deist, DPM;  Location: [facility];  Service: Podiatry;  Laterality: Left;    Social History   Socioeconomic History   Marital status: Divorced    Spouse name: Not on file   Number of children: Not on file   Years of education: Not on file   Highest education level: Not on file  Occupational History   Not on file  Tobacco Use   Smoking status: Former    Packs/day: 1.00    Years: 30.00    Pack years: 30.00    Types: Cigarettes    Quit date: 10/07/2018    Years since quitting: 2.4   Smokeless tobacco: Never  Vaping Use   Vaping Use: Never used  Substance and Sexual Activity   Alcohol use: No   Drug use: No   Sexual activity: Not Currently  Other Topics Concern   Not on file  Social History Narrative   Not on file   Social Determinants of Health   Financial Resource Strain: Not on file  Food Insecurity: Not on file  Transportation Needs: Not on file  Physical Activity: Not on file  Stress: Not on file  Social Connections: Not on file  Intimate Partner Violence: Not on file    Family History  Problem Relation Age of Onset   Diabetes Mother    Coronary artery disease Father    Hypertension Sister    Leukemia Brother    No Known Allergies I? Current Facility-Administered Medications  Medication Dose Route Frequency Provider Last Rate Last Admin   acetaminophen (TYLENOL) tablet 650 mg  650 mg Oral Q6H PRN Tacey Ruiz, MD       Or   acetaminophen (TYLENOL) suppository 650 mg  650 mg Rectal Q6H PRN Tacey Ruiz, MD       apixaban Arne [facility]) tablet 5 mg  5 mg Oral BID Rauer, Forde Dandy, RPH   5 mg at 04/02/21 0855   aspirin EC tablet 81 mg  81 mg Oral Daily Tacey Ruiz, MD   81 mg at 04/02/21 0856   atorvastatin (LIPITOR) tablet 80 mg  80 mg Oral q1800 Tacey Ruiz, MD    80 mg at 04/01/21 1740   bisacodyl (DULCOLAX) EC tablet 5 mg  5 mg Oral Daily PRN Tacey Ruiz, MD       cefTRIAXone (ROCEPHIN) 2 g in sodium chloride 0.9 % 100 mL IVPB  2 g Intravenous Q24H Wouk, Ailene Rud, MD   Stopped at 04/01/21 2104   docusate sodium (COLACE) capsule 100 mg  100 mg Oral BID Tacey Ruiz, MD   100 mg at 04/02/21 0855   hydrALAZINE (APRESOLINE) tablet 25 mg  25 mg Oral Q6H PRN Tacey Ruiz, MD       HYDROcodone-acetaminophen (NORCO/VICODIN) 5-325 MG per tablet 1-2 tablet  1-2 tablet Oral Q4H PRN Tacey Ruiz, MD   1 tablet at 04/02/21 0557   insulin aspart (novoLOG) injection 0-15 Units  0-15 Units Subcutaneous TID WC Tacey Ruiz, MD   2 Units at 04/02/21 0803   insulin aspart (novoLOG) injection 4 Units  4 Units Subcutaneous TID WC Tacey Ruiz, MD   4 Units at 04/02/21 0802   insulin glargine (LANTUS) injection 25 Units  25 Units Subcutaneous QHS Gwynne Edinger, MD   25 Units at 04/01/21 2132   losartan (COZAAR) tablet 25 mg  25 mg Oral Daily Tacey Ruiz, MD   25 mg at 04/02/21 0855   magnesium citrate solution 1 Bottle  1 Bottle Oral Once PRN Tacey Ruiz, MD       morphine 2 MG/ML injection 2 mg  2 mg Intravenous Q2H PRN Tacey Ruiz, MD       ondansetron [facility]) tablet 4 mg  4 mg Oral Q6H PRN Tacey Ruiz, MD       Or   ondansetron (ZOFRAN) injection 4 mg  4 mg Intravenous Q6H PRN Tacey Ruiz, MD       senna-docusate (Senokot-S) tablet 1 tablet  1 tablet Oral QHS PRN Tacey Ruiz, MD       vancomycin (VANCOREADY) IVPB 1500 mg/300 mL  1,500 mg Intravenous Q12H Tacey Ruiz, MD 150 mL/hr at 04/02/21 0854 1,500 mg at 04/02/21 0854     Abtx:  Anti-infectives (From admission, onward)    Start     Dose/Rate Route Frequency Ordered Stop   03/31/21 1700  cefTRIAXone (ROCEPHIN) 1 g in sodium chloride 0.9 % 100 mL IVPB  Status:  Discontinued        1 g 200 mL/hr over 30 Minutes Intravenous Every 24 hours 03/30/21 1621 03/31/21  0906   03/31/21 1700  cefTRIAXone (ROCEPHIN) 2 g in sodium chloride 0.9 % 100 mL IVPB        2 g 200 mL/hr over 30 Minutes Intravenous Every 24 hours 03/31/21 0906 04/07/21 1659   03/31/21 0500  vancomycin (VANCOREADY) IVPB 1500 mg/300 mL        1,500 mg 150 mL/hr over 120 Minutes Intravenous Every 12 hours 03/30/21 2027     03/30/21 1645  vancomycin (VANCOREADY) IVPB 2000 mg/400 mL        2,000 mg 200 mL/hr over 120 Minutes Intravenous  Once 03/30/21 1643 03/30/21 2053   03/30/21 1615  vancomycin (VANCOCIN) IVPB 1000 mg/200 mL premix  Status:  Discontinued        1,000 mg 200 mL/hr over 60 Minutes Intravenous  Once 03/30/21 1600 03/30/21 1643   03/30/21 1615  cefTRIAXone (ROCEPHIN) 1 g in sodium chloride 0.9 % 100 mL IVPB        1 g 200 mL/hr over 30 Minutes Intravenous  Once 03/30/21 1600 03/30/21 1740       REVIEW OF SYSTEMS:  Const: negative fever, negative chills, negative weight loss Eyes: negative diplopia or visual changes, negative eye pain ENT: negative coryza, negative sore throat Resp: negative cough, hemoptysis, dyspnea Cards: negative for chest pain, palpitations, lower extremity edema GU: negative for frequency, dysuria and hematuria GI: Negative for abdominal pain, diarrhea, bleeding, constipation Skin: negative for rash and pruritus Heme: negative for easy bruising and gum/nose bleeding MS: Swelling right leg Neurolo:negative for headaches, dizziness, vertigo, memory problems  Psych: negative for feelings of anxiety, depression  Endocrine: Diabetes mellitus Allergy/Immunology- negative for any medication or food allergies ? Objective:  VITALS:  BP 125/65 (BP Location: Left Arm)   Pulse (!) 58   Temp (!) 97.4 F (36.3 C) (Oral)   Resp 18   Ht  '6\' 4"'  (1.93 m)   Wt 111.1 kg   SpO2 100%   BMI 29.82 kg/m  PHYSICAL EXAM:  General: Alert, cooperative, no distress, appears stated age.  Head: Normocephalic, without obvious abnormality, atraumatic. Eyes:  Conjunctivae clear, anicteric sclerae. Pupils are equal ENT Nares normal. No drainage or sinus tenderness. Lips, mucosa, and tongue normal. No Thrush Neck: Supple, symmetrical, no adenopathy, thyroid: non tender no carotid bruit and no JVD. Back: No CVA tenderness. Lungs: Clear to auscultation bilaterally. No Wheezing or Rhonchi. No rales. Heart: Regular rate and rhythm, no murmur, rub or gallop. Abdomen: Soft, non-tender,not distended. Bowel sounds normal. No masses Extremities: Left BKA has a prosthetic leg. Right foot lateral aspect of the fifth toe the ulcer is healed completely with no erythema or discharge Minimal swelling of the leg Scabs over the right shin.  None look infected    Skin: No rashes or lesions. Or bruising Lymph: Cervical, supraclavicular normal. Neurologic: Grossly non-focal Pertinent Labs Lab Results CBC    Component Value Date/Time   WBC 8.4 03/31/2021 0440   RBC 5.06 03/31/2021 0440   HGB 13.9 03/31/2021 0440   HGB 14.1 11/17/2013 0621   HCT 41.3 03/31/2021 0440   HCT 40.1 11/17/2013 0621   PLT 199 03/31/2021 0440   PLT 218 11/17/2013 0621   MCV 81.6 03/31/2021 0440   MCV 86 11/17/2013 0621   MCH 27.5 03/31/2021 0440   MCHC 33.7 03/31/2021 0440   RDW 16.2 (H) 03/31/2021 0440   RDW 13.6 11/17/2013 0621   LYMPHSABS 2.6 03/30/2021 0958   LYMPHSABS 2.4 11/17/2013 0621   MONOABS 0.7 03/30/2021 0958   MONOABS 0.9 11/17/2013 0621   EOSABS 0.1 03/30/2021 0958   EOSABS 0.1 11/17/2013 0621   BASOSABS 0.0 03/30/2021 0958   BASOSABS 0.0 11/17/2013 0621    CMP Latest Ref Rng & Units 04/02/2021 04/01/2021 03/31/2021  Glucose 70 - 99 mg/dL 130(H) 205(H) 204(H)  BUN 8 - 23 mg/dL '8 9 10  ' Creatinine 0.61 - 1.24 mg/dL 0.71 0.70 0.78  Sodium 135 - 145 mmol/L 139 139 138  Potassium 3.5 - 5.1 mmol/L 3.8 3.8 3.8  Chloride 98 - 111 mmol/L 110 110 105  CO2 22 - 32 mmol/L '23 24 27  ' Calcium 8.9 - 10.3 mg/dL 8.8(L) 8.5(L) 8.8(L)  Total Protein 6.5 - 8.1 g/dL - - -   Total Bilirubin 0.3 - 1.2 mg/dL - - -  Alkaline Phos 38 - 126 U/L - - -  AST 15 - 41 U/L - - -  ALT 0 - 44 U/L - - -      Microbiology: Recent Results (from the past 240 hour(s))  Resp Panel by RT-PCR (Flu A&B, Covid) Nasopharyngeal Swab     Status: None   Collection Time: 03/30/21  4:00 PM   Specimen: Nasopharyngeal Swab; Nasopharyngeal(NP) swabs in vial transport medium  Result Value Ref Range Status   SARS Coronavirus 2 by RT PCR NEGATIVE NEGATIVE Final    Comment: (NOTE) SARS-CoV-2 target nucleic acids are NOT DETECTED.  The SARS-CoV-2 RNA is generally detectable in upper respiratory specimens during the acute phase of infection. The lowest concentration of SARS-CoV-2 viral copies this assay can detect is 138 copies/mL. A negative result does not preclude SARS-Cov-2 infection and should not be used as the sole basis for treatment or other patient management decisions. A negative result may occur with  improper specimen collection/handling, submission of specimen other than nasopharyngeal swab, presence of viral mutation(s) within the areas targeted by  this assay, and inadequate number of viral copies(<138 copies/mL). A negative result must be combined with clinical observations, patient history, and epidemiological information. The expected result is Negative.  Fact Sheet for Patients:  EntrepreneurPulse.com.au  Fact Sheet for Healthcare Providers:  IncredibleEmployment.be  This test is no t yet approved or cleared by the [place] FDA and  has been authorized for detection and/or diagnosis of SARS-CoV-2 by FDA under an Emergency Use Authorization (EUA). This EUA will remain  in effect (meaning this test can be used) for the duration of the COVID-19 declaration under Section 564(b)(1) of the Act, 21 U.S.C.section 360bbb-3(b)(1), unless the authorization is terminated  or revoked sooner.       Influenza A by PCR NEGATIVE  NEGATIVE Final   Influenza B by PCR NEGATIVE NEGATIVE Final    Comment: (NOTE) The Xpert Xpress SARS-CoV-2/FLU/RSV plus assay is intended as an aid in the diagnosis of influenza from Nasopharyngeal swab specimens and should not be used as a sole basis for treatment. Nasal washings and aspirates are unacceptable for Xpert Xpress SARS-CoV-2/FLU/RSV testing.  Fact Sheet for Patients: EntrepreneurPulse.com.au  Fact Sheet for Healthcare Providers: IncredibleEmployment.be  This test is not yet approved or cleared by the [place] FDA and has been authorized for detection and/or diagnosis of SARS-CoV-2 by FDA under an Emergency Use Authorization (EUA). This EUA will remain in effect (meaning this test can be used) for the duration of the COVID-19 declaration under Section 564(b)(1) of the Act, 21 U.S.C. section 360bbb-3(b)(1), unless the authorization is terminated or revoked.  Performed at [facility], [address] [place], [place] [address]   Blood culture (routine x 2)     Status: None (Preliminary result)   Collection Time: 03/30/21  4:26 PM   Specimen: BLOOD  Result Value Ref Range Status   Specimen Description BLOOD BLOOD RIGHT WRIST  Final   Special Requests   Final    BOTTLES DRAWN AEROBIC AND ANAEROBIC Blood Culture results may not be optimal due to an inadequate volume of blood received in culture bottles   Culture   Final    NO GROWTH 3 DAYS Performed at [facility], [address] [place], [place] [address]    Report Status PENDING  Incomplete  Blood culture (routine x 2)     Status: None (Preliminary result)   Collection Time: 03/30/21  4:39 PM   Specimen: BLOOD  Result Value Ref Range Status   Specimen Description BLOOD LEFT ANTECUBITAL  Final   Special Requests   Final    BOTTLES DRAWN AEROBIC ONLY Blood Culture results may not be optimal due to an inadequate volume of blood received in culture  bottles   Culture   Final    NO GROWTH 3 DAYS Performed at [facility], [address], [place], [place] [address]    Report Status PENDING  Incomplete    IMAGING RESULTS: MRI of the foot reviewed. Erosions and destruction of the fifth metatarsal head.  There is associated marrow edema and enhancement consistent with osteomyelitis.  I have personally reviewed the films ? Impression/Recommendation 65 year old male who presented with abnormal x-ray of the foot. He had a chronic ulcer on the right foot on the lateral aspect of his fifth toe.  That ulcer has healed up completely.  Under that area x-ray had shown erosion.  The MRI also shows erosion and destruction of the fifth metatarsal head. Since there is no open wound this is not an active osteomyelitis.  This could be an old burned-out osteomyelitis versus pressure erosion. I do not see any point in treating with antibiotics currently. If the wound opens up then he will need culture as well as surgery. He was assessed by Dr. Samara Deist podiatrist who did not think he needed any surgical intervention now and he agreed with my plan of not putting him on any antibiotics currently.  Peripheral arterial disease Left BKA  Diabetes mellitus management as per primary team.  On insulin  Hypertension on losartan. ? CAD on atorvastatin.  ___________________________________________________ Discussed with patient, requesting provider in great detail. Patient will be followed as outpatient with podiatrist a and will be referred to me if needed.  Note:  This document was prepared using Dragon voice recognition software and may include unintentional dictation errors.

## 2021-04-02 NOTE — Evaluation (Signed)
Occupational Therapy Evaluation Patient Details Name: Austin [name] MRN: [id number] DOB: [birth year] Today's Date: 04/02/2021    History of Present Illness Austin [name] is a 65 y.o. male with medical history significant for diabetes, PVD, left below the knee amputation, history of diabetic ulcers/wounds, history of DVT, HTN, HLD who presents to the emergency department on 03/30/2021 with worsening pain of his right foot.  Patient first developed ulcer and pain of right foot approximately 6 months prior to admission, but symptoms have worsened in the past 1 to 2 weeks and have been constantly present. Now diagnosed with acute osteomyelitis of the R foot.   Clinical Impression   Pt was seen for OT evaluation this date. Prior to hospital admission, pt was primarily using a w/c for mobility, PRN 2WW, mod indep with bathing, dressing, and was able to drive until recently (does not have a car). Pt plans to discharge to a friend's home (home set up provided below for friend's home) who can provide assist as needed, per pt report. Currently pt demonstrates impairments as described below (See OT problem list) which functionally limit his ability to perform ADL/self-care tasks at baseline independence. Pt currently requires supervision and increased effort to perform bed mobility, MIN A for LB dressing, MIN-MOD A for ADL transfers from elevated surfaces + RW. Pt able to get from the EOB to the recliner with CGA + RW, mildly unsteady. Pt would benefit from skilled OT services to address noted impairments and functional limitations (see below for any additional details) in order to maximize safety and independence while minimizing falls risk and caregiver burden. Upon hospital discharge, recommend HHOT to maximize pt safety and return to functional independence during meaningful occupations of daily life.     Follow Up Recommendations  Home health OT    Equipment Recommendations  3 in 1 bedside commode     Recommendations for Other Services       Precautions / Restrictions Precautions Precautions: Fall Required Braces or Orthoses: Other Brace Other Brace: LLE prosthetic Restrictions Weight Bearing Restrictions: Yes RLE Weight Bearing: Weight bearing as tolerated      Mobility Bed Mobility Overal bed mobility: Needs Assistance Bed Mobility: Supine to Sit     Supine to sit: Supervision     General bed mobility comments: increased time/effort but able to perform with significant assist    Transfers Overall transfer level: Needs assistance Equipment used: Rolling walker (2 wheeled) Transfers: Sit to/from Stand Sit to Stand: Min assist;Mod assist;From elevated surface         General transfer comment: significantly elevated, MIN-MOD A to initiate    Balance Overall balance assessment: Needs assistance Sitting-balance support: No upper extremity supported;Feet unsupported Sitting balance-Leahy Scale: Good     Standing balance support: During functional activity;Bilateral upper extremity supported Standing balance-Leahy Scale: Fair                             ADL either performed or assessed with clinical judgement   ADL Overall ADL's : Needs assistance/impaired                                       General ADL Comments: Pt required MIN A For LB dressing donning shoe to R foot, indep with LLE prosthesis, MIN-MOD A for ADL transfers + RW     Vision Patient Visual  Report: No change from baseline       Perception     Praxis      Pertinent Vitals/Pain Pain Assessment: No/denies pain     Hand Dominance Right   Extremity/Trunk Assessment Upper Extremity Assessment Upper Extremity Assessment: Overall WFL for tasks assessed   Lower Extremity Assessment Lower Extremity Assessment: Generalized weakness;RLE deficits/detail;LLE deficits/detail RLE Sensation: decreased light touch LLE Deficits / Details: Good mobility of residual  limb       Communication Communication Communication: No difficulties   Cognition Arousal/Alertness: Awake/alert Behavior During Therapy: WFL for tasks assessed/performed Overall Cognitive Status: No family/caregiver present to determine baseline cognitive functioning                                     General Comments       Exercises     Shoulder Instructions      Home Living Family/patient expects to be discharged to:: Private residence (friend's home) Living Arrangements: Non-relatives/Friends Available Help at Discharge: Available PRN/intermittently;Friend(s) Type of Home: House Home Access: Stairs to enter Secretary/administrator of Steps: 2 Entrance Stairs-Rails: None Home Layout: One level     Bathroom Shower/Tub: Chief Strategy Officer: Standard Bathroom Accessibility: No   Home Equipment: Environmental consultant - 2 wheels;Wheelchair - manual          Prior Functioning/Environment Level of Independence: Independent with assistive device(s)        Comments: Pt reports mostly using w/c for mobility prior to admission. Intermittently using RW for gait.        OT Problem List: Decreased strength;Impaired sensation;Impaired balance (sitting and/or standing);Decreased knowledge of use of DME or AE      OT Treatment/Interventions: Self-care/ADL training;Therapeutic exercise;Therapeutic activities;DME and/or AE instruction;Balance training;Patient/family education    OT Goals(Current goals can be found in the care plan section) Acute Rehab OT Goals Patient Stated Goal: to walk better OT Goal Formulation: With patient Time For Goal Achievement: 04/16/21 Potential to Achieve Goals: Good ADL Goals Pt Will Perform Lower Body Dressing: with modified independence;sitting/lateral leans Pt Will Transfer to Toilet: with modified independence;ambulating (L prosthesis on, LRAD for amb, elevated toilet) Additional ADL Goal #1: Pt will verbalize plan to  implement at least 1 learned falls prevention strategy at home.  OT Frequency: Min 1X/week   Barriers to D/C:            Co-evaluation              AM-PAC OT "6 Clicks" Daily Activity     Outcome Measure Help from another person eating meals?: None Help from another person taking care of personal grooming?: None Help from another person toileting, which includes using toliet, bedpan, or urinal?: A Little Help from another person bathing (including washing, rinsing, drying)?: A Little Help from another person to put on and taking off regular upper body clothing?: None Help from another person to put on and taking off regular lower body clothing?: A Little 6 Click Score: 21   End of Session Equipment Utilized During Treatment: Rolling walker Nurse Communication: Mobility status  Activity Tolerance: Patient tolerated treatment well Patient left: in chair;with call bell/phone within reach;with chair alarm set;with nursing/sitter in room  OT Visit Diagnosis: Other abnormalities of gait and mobility (R26.89);Muscle weakness (generalized) (M62.81)                Time: 1062-6948 OT Time Calculation (min): 16 min  Charges:  OT General Charges $OT Visit: 1 Visit OT Evaluation $OT Eval Moderate Complexity: 1 Mod  Wynona Canes, MPH, MS, OTR/L ascom [phone] 04/02/21, 10:44 AM

## 2021-04-02 NOTE — TOC Progression Note (Signed)
Transition of Care [facility]) - Progression Note    Patient Details  Name: WINDLE HUEBERT MRN: [id number] Date of Birth: [birth year]  Transition [facility] CM/SW Contact  Barrie Dunker, RN Phone Number: 04/02/2021, 11:53 AM  Clinical Narrative:     Patient decided he did not want the extenders due to the cost,   Expected Discharge Plan: Home w Home Health Services Barriers to Discharge: Continued Medical Work up  Expected Discharge Plan and Services Expected Discharge Plan: Home w Home Health Services   Discharge Planning Services: CM Consult Post Acute Care Choice: Durable Medical Equipment Living arrangements for the past 2 months: Mobile Home Expected Discharge Date: 04/02/21               DME Arranged: Dan Humphreys rolling DME Agency: [employer] Date DME Agency Contacted: 04/02/21 Time DME Agency Contacted: 1102 Representative spoke with at DME Agency: Bjorn Loser HH Arranged: PT HH Agency: [facility] (now [facility]) Date HH Agency Contacted: 04/02/21 Time HH Agency Contacted: 1102 Representative spoke with at [facility] Agency: [place]   Social Determinants of Health (SDOH) Interventions    Readmission Risk Interventions Readmission Risk Prevention Plan 10/04/2018  Post Dischage Appt Complete  Medication Screening Complete  Transportation Screening Complete  PCP follow-up Complete  Some recent data might be hidden

## 2021-04-02 NOTE — Progress Notes (Signed)
Met with the patient to discuss DC plan and needs He has a prothetic leg, a wheelchair and a rolling wlaker however with his height the rolling walker would not be sufficient He is staying with a friend right now while his home is being repaired, he needs transportation assistance, I provided him with the [phone] number to arrange transport to the [facility], and explained it had to be a cone or [facility] doctor He will get a tall RW brought into the room by [employer], I notified [place] at [facility] of the need for [facility] PT, She will let me know if they can accept the patient, he is able to get his medications

## 2021-04-03 LAB — HEMOGLOBIN A1C
Hgb A1c MFr Bld: 8.3 % — ABNORMAL HIGH (ref 4.8–5.6)
Mean Plasma Glucose: 192 mg/dL

## 2021-04-04 LAB — CULTURE, BLOOD (ROUTINE X 2)
Culture: NO GROWTH
Culture: NO GROWTH

## 2021-04-06 ENCOUNTER — Ambulatory Visit (INDEPENDENT_AMBULATORY_CARE_PROVIDER_SITE_OTHER): Payer: 59 | Admitting: Nurse Practitioner

## 2021-04-06 ENCOUNTER — Other Ambulatory Visit: Payer: Self-pay

## 2021-04-06 ENCOUNTER — Encounter (INDEPENDENT_AMBULATORY_CARE_PROVIDER_SITE_OTHER): Payer: Self-pay | Admitting: Nurse Practitioner

## 2021-04-06 ENCOUNTER — Ambulatory Visit (INDEPENDENT_AMBULATORY_CARE_PROVIDER_SITE_OTHER): Payer: 59

## 2021-04-06 VITALS — BP 123/79 | HR 103 | Resp 16

## 2021-04-06 DIAGNOSIS — I10 Essential (primary) hypertension: Secondary | ICD-10-CM | POA: Diagnosis not present

## 2021-04-06 DIAGNOSIS — E1151 Type 2 diabetes mellitus with diabetic peripheral angiopathy without gangrene: Secondary | ICD-10-CM

## 2021-04-06 DIAGNOSIS — I7025 Atherosclerosis of native arteries of other extremities with ulceration: Secondary | ICD-10-CM | POA: Diagnosis not present

## 2021-04-06 DIAGNOSIS — Z794 Long term (current) use of insulin: Secondary | ICD-10-CM

## 2021-04-06 NOTE — Progress Notes (Signed)
Subjective:    Patient ID: Austin [name], male    DOB: 1955/12/20, 65 y.o.   MRN: [id number] Chief Complaint  Patient presents with  . Follow-up    Ultrasound follow up    Austin [name] is a 65 year old man that presents today for follow-up evaluation of his peripheral arterial disease.  The patient notes that recently he spent several days hospitalized due to pain and swelling in the right lower extremity.  It was thought that he had osteomyelitis in his fifth toe.  However following several days of antibiotics and evaluation it was determined that the osteomyelitis infection was not active due to the open wound and now elevated sedimentation rate.  The patient was given an offloading shoe and advised to follow-up with podiatry in several weeks.  He denies any claudication.  He denies any new open wounds or ulcerations.  There is evidence of several new healed ulcerations.  He denies chest pain or shortness of breath.  Today noninvasive studies show an ABI 0.85.  [name] TBI 0.  6 9.  The patient has strong monophasic waveforms with good toe waveforms in the right lower extremity.   Review of Systems  Cardiovascular:  Positive for leg swelling.  All other systems reviewed and are negative.     Objective:   Physical Exam Vitals reviewed.  HENT:     Head: Normocephalic.  Cardiovascular:     Rate and Rhythm: Normal rate.     Pulses: Normal pulses.  Pulmonary:     Effort: Pulmonary effort is normal.  Musculoskeletal:     Right lower leg: Edema present.     Left Lower Extremity: Left leg is amputated below knee.  Neurological:     Mental Status: He is alert and oriented to person, place, and time.  Psychiatric:        Mood and Affect: Mood normal.        Behavior: Behavior normal.        Thought Content: Thought content normal.        Judgment: Judgment normal.    BP 123/79 (BP Location: Right Arm)   Pulse (!) 103   Resp 16   Past Medical History:  Diagnosis Date   . Coronary artery disease   . Diabetes ([place])   . DVT (deep venous thrombosis) ([place])   . GERD (gastroesophageal reflux disease)   . Hyperlipidemia   . Hypertension   . Peripheral vascular disease ([place])     Social History   Socioeconomic History  . Marital status: Divorced    Spouse name: Not on file  . Number of children: Not on file  . Years of education: Not on file  . Highest education level: Not on file  Occupational History  . Not on file  Tobacco Use  . Smoking status: Former    Packs/day: 1.00    Years: 30.00    Pack years: 30.00    Types: Cigarettes    Quit date: 10/07/2018    Years since quitting: 2.4  . Smokeless tobacco: Never  Vaping Use  . Vaping Use: Never used  Substance and Sexual Activity  . Alcohol use: No  . Drug use: No  . Sexual activity: Not Currently  Other Topics Concern  . Not on file  Social History Narrative  . Not on file   Social Determinants of Health   Financial Resource Strain: Not on file  Food Insecurity: Not on file  Transportation Needs: Not on file  Physical Activity: Not on file  Stress: Not on file  Social Connections: Not on file  Intimate Partner Violence: Not on file    Past Surgical History:  Procedure Laterality Date  . AMPUTATION Left 10/28/2018   Procedure: AMPUTATION BELOW KNEE;  Surgeon: Algernon Huxley, MD;  Location: [facility];  Service: Vascular;  Laterality: Left;  . AMPUTATION TOE Left 02/10/2018   Procedure: AMPUTATION TOE;  Surgeon: Sharlotte Alamo, DPM;  Location: [facility];  Service: Podiatry;  Laterality: Left;  . APPLICATION OF WOUND VAC Left 10/16/2018   Procedure: APPLICATION OF WOUND VAC;  Surgeon: Samara Deist, DPM;  Location: [facility];  Service: Podiatry;  Laterality: Left;  . DORSAL SLIT N/A 01/12/2021   Procedure: DORSAL SLIT;  Surgeon: Billey Co, MD;  Location: [facility];  Service: Urology;  Laterality: N/A;  . groin surgery    . IRRIGATION AND DEBRIDEMENT FOOT Left 09/30/2018   Procedure:  IRRIGATION AND DEBRIDEMENT FOOT;  Surgeon: Sharlotte Alamo, DPM;  Location: [facility];  Service: Podiatry;  Laterality: Left;  . LOWER EXTREMITY ANGIOGRAPHY Left 02/12/2018   Procedure: Lower Extremity Angiography;  Surgeon: Algernon Huxley, MD;  Location: [place] [facility];  Service: Cardiovascular;  Laterality: Left;  . LOWER EXTREMITY ANGIOGRAPHY Left 10/01/2018   Procedure: Lower Extremity Angiography;  Surgeon: Algernon Huxley, MD;  Location: [place] [facility];  Service: Cardiovascular;  Laterality: Left;  . LOWER EXTREMITY ANGIOGRAPHY Left 10/19/2018   Procedure: Lower Extremity Angiography;  Surgeon: Algernon Huxley, MD;  Location: [place] [facility];  Service: Cardiovascular;  Laterality: Left;  . LOWER EXTREMITY ANGIOGRAPHY Left 10/20/2018   Procedure: LOWER EXTREMITY ANGIOGRAPHY;  Surgeon: Algernon Huxley, MD;  Location: [place] [facility];  Service: Cardiovascular;  Laterality: Left;  . LOWER EXTREMITY ANGIOGRAPHY Right 11/25/2018   Procedure: LOWER EXTREMITY ANGIOGRAPHY;  Surgeon: Algernon Huxley, MD;  Location: [place] [facility];  Service: Cardiovascular;  Laterality: Right;  . LOWER EXTREMITY ANGIOGRAPHY Right 01/10/2020   Procedure: LOWER EXTREMITY ANGIOGRAPHY;  Surgeon: Algernon Huxley, MD;  Location: [place] [facility];  Service: Cardiovascular;  Laterality: Right;  . LOWER EXTREMITY ANGIOGRAPHY Right 12/11/2020   Procedure: LOWER EXTREMITY ANGIOGRAPHY;  Surgeon: Algernon Huxley, MD;  Location: [place] [facility];  Service: Cardiovascular;  Laterality: Right;  . LOWER EXTREMITY INTERVENTION  02/12/2018   Procedure: LOWER EXTREMITY INTERVENTION;  Surgeon: Algernon Huxley, MD;  Location: [place] [facility];  Service: Cardiovascular;;  . NECK SURGERY    . TRANSMETATARSAL AMPUTATION Left 10/16/2018   Procedure: TRANSMETATARSAL AMPUTATION LEFT FOOT;  Surgeon: Samara Deist, DPM;  Location: [facility];  Service: Podiatry;  Laterality: Left;    Family History  Problem Relation Age of Onset  . Diabetes  Mother   . Coronary artery disease Father   . Hypertension Sister   . Leukemia Brother     No Known Allergies  CBC Latest Ref Rng & Units 03/31/2021 03/30/2021 01/11/2021  WBC 4.0 - 10.5 K/uL 8.4 8.2 8.8  Hemoglobin 13.0 - 17.0 g/dL 13.9 15.3 14.2  Hematocrit 39.0 - 52.0 % 41.3 45.7 43.2  Platelets 150 - 400 K/uL 199 231 240      CMP     Component Value Date/Time   NA 139 04/02/2021 0557   NA 135 (L) 11/17/2013 0621   K 3.8 04/02/2021 0557   K 4.3 11/17/2013 0621   CL 110 04/02/2021 0557   CL 99 11/17/2013 0621   CO2 23 04/02/2021  0557   CO2 31 11/17/2013 0621   GLUCOSE 130 (H) 04/02/2021 0557   GLUCOSE 172 (H) 11/17/2013 0621   BUN 8 04/02/2021 0557   BUN 10 11/17/2013 0621   CREATININE 0.71 04/02/2021 0557   CREATININE 0.85 11/17/2013 0621   CALCIUM 8.8 (L) 04/02/2021 0557   CALCIUM 9.6 11/17/2013 0621   PROT 7.4 03/30/2021 0958   PROT 8.5 (H) 01/10/2013 2155   ALBUMIN 3.6 03/30/2021 0958   ALBUMIN 4.0 01/10/2013 2155   AST 24 03/30/2021 0958   AST 16 01/10/2013 2155   ALT 21 03/30/2021 0958   ALT 26 01/10/2013 2155   ALKPHOS 62 03/30/2021 0958   ALKPHOS 91 01/10/2013 2155   BILITOT 0.8 03/30/2021 0958   BILITOT 0.3 01/10/2013 2155   GFRNONAA >60 04/02/2021 0557   GFRNONAA >60 11/17/2013 0621   GFRAA >60 01/10/2020 0808   GFRAA >60 11/17/2013 0621     No results found.     Assessment & Plan:   1. Atherosclerosis of native arteries of the extremities with ulceration ([place])  Recommend:  The patient has evidence of atherosclerosis of the lower extremities with claudication.  The patient does not voice lifestyle limiting changes at this point in time.  Noninvasive studies do not suggest clinically significant change.  No invasive studies, angiography or surgery at this time The patient should continue walking and begin a more formal exercise program.  The patient should continue antiplatelet therapy and aggressive treatment of the lipid  abnormalities  No changes in the patient's medications at this time  The patient should continue wearing graduated compression socks 10-15 mmHg strength to control the mild edema.    Due to the patient's slight reduction in numbers as well as the recent concern for osteomyelitis we will maintain a closer follow-up.  Patient will return in 3 months for noninvasive studies.  2. Essential hypertension Continue antihypertensive medications as already ordered, these medications have been reviewed and there are no changes at this time.   3. Type 2 diabetes mellitus with diabetic peripheral angiopathy without gangrene, with long-term current use of insulin ([place]) Continue hypoglycemic medications as already ordered, these medications have been reviewed and there are no changes at this time.  Hgb A1C to be monitored as already arranged by primary service    Current Outpatient Medications on File Prior to Visit  Medication Sig Dispense Refill  . ACCU-CHEK GUIDE test strip     . apixaban (ELIQUIS) 5 MG TABS tablet Take 1 tablet (5 mg total) by mouth 2 (two) times daily. 60 tablet O  . aspirin EC 81 MG tablet Take 1 tablet (81 mg total) by mouth daily. 150 tablet 2  . atorvastatin (LIPITOR) 80 MG tablet Take 1 tablet (80 mg total) by mouth daily at 6 PM. 30 tablet 0  . Blood Glucose Monitoring Suppl (ACCU-CHEK GUIDE) w/Device KIT USE 1 KIT AS DIRECTED FOUR TIMES A DAY    . Docusate Sodium (DSS) 100 MG CAPS Take 100 mg by mouth in the morning and at bedtime.    Marland Kitchen HUMALOG KWIKPEN 100 UNIT/ML KwikPen Inject 24 Units into the skin 3 (three) times daily before meals.    . insulin aspart (NOVOLOG) 100 UNIT/ML injection Inject 10 Units into the skin 3 (three) times daily with meals. 10 mL 0  . insulin glargine (LANTUS) 100 UNIT/ML injection Inject 0.3 mLs (30 Units total) into the skin at bedtime. 10 mL 11  . insulin glargine, 1 Unit Dial, (TOUJEO SOLOSTAR) 300  UNIT/ML Solostar Pen Inject 24 Units into the  skin at bedtime.    Marland Kitchen losartan (COZAAR) 25 MG tablet Take 25 mg by mouth daily.    . polyethylene glycol (MIRALAX) packet Take 17 g by mouth daily. 14 each 0  . TRUEPLUS 5-BEVEL PEN NEEDLES 31G X 6 MM MISC      No current facility-administered medications on file prior to visit.    There are no Patient Instructions on file for this visit. No follow-ups on file.   Kris Hartmann, NP

## 2021-07-13 ENCOUNTER — Ambulatory Visit (INDEPENDENT_AMBULATORY_CARE_PROVIDER_SITE_OTHER): Payer: 59 | Admitting: Vascular Surgery

## 2021-07-13 ENCOUNTER — Other Ambulatory Visit: Payer: Self-pay

## 2021-07-13 ENCOUNTER — Ambulatory Visit (INDEPENDENT_AMBULATORY_CARE_PROVIDER_SITE_OTHER): Payer: 59

## 2021-07-13 ENCOUNTER — Other Ambulatory Visit (INDEPENDENT_AMBULATORY_CARE_PROVIDER_SITE_OTHER): Payer: Self-pay | Admitting: Nurse Practitioner

## 2021-07-13 VITALS — BP 123/78 | HR 86 | Ht 76.0 in | Wt 240.0 lb

## 2021-07-13 DIAGNOSIS — L97211 Non-pressure chronic ulcer of right calf limited to breakdown of skin: Secondary | ICD-10-CM

## 2021-07-13 DIAGNOSIS — Z794 Long term (current) use of insulin: Secondary | ICD-10-CM

## 2021-07-13 DIAGNOSIS — Z89512 Acquired absence of left leg below knee: Secondary | ICD-10-CM | POA: Diagnosis not present

## 2021-07-13 DIAGNOSIS — I70239 Atherosclerosis of native arteries of right leg with ulceration of unspecified site: Secondary | ICD-10-CM

## 2021-07-13 DIAGNOSIS — I7025 Atherosclerosis of native arteries of other extremities with ulceration: Secondary | ICD-10-CM | POA: Diagnosis not present

## 2021-07-13 DIAGNOSIS — I10 Essential (primary) hypertension: Secondary | ICD-10-CM

## 2021-07-13 DIAGNOSIS — E1151 Type 2 diabetes mellitus with diabetic peripheral angiopathy without gangrene: Secondary | ICD-10-CM

## 2021-07-13 DIAGNOSIS — L97209 Non-pressure chronic ulcer of unspecified calf with unspecified severity: Secondary | ICD-10-CM | POA: Insufficient documentation

## 2021-07-13 NOTE — Assessment & Plan Note (Signed)
3 layer Unna boot will be placed today and changed weekly.  We will reassess this in a few weeks.  That should help control his swelling get his ulcerations healed.

## 2021-07-13 NOTE — Assessment & Plan Note (Signed)
His ABI today is down a little 0.74 on the right with a digit pressure of 70.  His waveforms are monophasic.  He should have adequate flow for healing at the calf level, but if his ulcers do not heal somewhat promptly we may have to consider another angiogram with intervention of the right leg.

## 2021-07-13 NOTE — Progress Notes (Signed)
MRN : [id number]  Austin [name] is a 65 y.o. (25-Nov-1955) male who presents with chief complaint of  Chief Complaint  Patient presents with   Follow-up    Fol 3 Mo   .  History of Present Illness: Patient returns today in follow up of his PAD and right leg ulcerations.  He has multiple calf ulcerations now and significant swelling in that right leg.  He is already lost the left leg.  We have undergone multiple previous revascularizations in the past with the most recent being earlier this year.  His ABI today is down a little 0.74 on the right with a digit pressure of 70.  His waveforms are monophasic.  Current Outpatient Medications  Medication Sig Dispense Refill   ACCU-CHEK GUIDE test strip      apixaban (ELIQUIS) 5 MG TABS tablet Take 1 tablet (5 mg total) by mouth 2 (two) times daily. 60 tablet O   aspirin EC 81 MG tablet Take 1 tablet (81 mg total) by mouth daily. 150 tablet 2   atorvastatin (LIPITOR) 80 MG tablet Take 1 tablet (80 mg total) by mouth daily at 6 PM. 30 tablet 0   Blood Glucose Monitoring Suppl (ACCU-CHEK GUIDE) w/Device KIT USE 1 KIT AS DIRECTED FOUR TIMES A DAY     Docusate Sodium (DSS) 100 MG CAPS Take 100 mg by mouth in the morning and at bedtime.     HUMALOG KWIKPEN 100 UNIT/ML KwikPen Inject 24 Units into the skin 3 (three) times daily before meals.     insulin aspart (NOVOLOG) 100 UNIT/ML injection Inject 10 Units into the skin 3 (three) times daily with meals. 10 mL 0   insulin glargine (LANTUS) 100 UNIT/ML injection Inject 0.3 mLs (30 Units total) into the skin at bedtime. 10 mL 11   insulin glargine, 1 Unit Dial, (TOUJEO SOLOSTAR) 300 UNIT/ML Solostar Pen Inject 24 Units into the skin at bedtime.     losartan (COZAAR) 25 MG tablet Take 25 mg by mouth daily.     polyethylene glycol (MIRALAX) packet Take 17 g by mouth daily. 14 each 0   TRUEPLUS 5-BEVEL PEN NEEDLES 31G X 6 MM MISC      No current facility-administered medications for this visit.     Past Medical History:  Diagnosis Date   Coronary artery disease    Diabetes ([place])    DVT (deep venous thrombosis) (HCC)    GERD (gastroesophageal reflux disease)    Hyperlipidemia    Hypertension    Peripheral vascular disease ([place])     Past Surgical History:  Procedure Laterality Date   AMPUTATION Left 10/28/2018   Procedure: AMPUTATION BELOW KNEE;  Surgeon: Algernon Huxley, MD;  Location: [facility];  Service: Vascular;  Laterality: Left;   AMPUTATION TOE Left 02/10/2018   Procedure: AMPUTATION TOE;  Surgeon: Sharlotte Alamo, DPM;  Location: [facility];  Service: Podiatry;  Laterality: Left;   APPLICATION OF WOUND VAC Left 10/16/2018   Procedure: APPLICATION OF WOUND VAC;  Surgeon: Samara Deist, DPM;  Location: [facility];  Service: Podiatry;  Laterality: Left;   DORSAL SLIT N/A 01/12/2021   Procedure: DORSAL SLIT;  Surgeon: Billey Co, MD;  Location: [facility];  Service: Urology;  Laterality: N/A;   groin surgery     IRRIGATION AND DEBRIDEMENT FOOT Left 09/30/2018   Procedure: IRRIGATION AND DEBRIDEMENT FOOT;  Surgeon: Sharlotte Alamo, DPM;  Location: [facility];  Service: Podiatry;  Laterality: Left;   LOWER EXTREMITY ANGIOGRAPHY Left  02/12/2018   Procedure: Lower Extremity Angiography;  Surgeon: Algernon Huxley, MD;  Location: [place] [facility];  Service: Cardiovascular;  Laterality: Left;   LOWER EXTREMITY ANGIOGRAPHY Left 10/01/2018   Procedure: Lower Extremity Angiography;  Surgeon: Algernon Huxley, MD;  Location: [place] [facility];  Service: Cardiovascular;  Laterality: Left;   LOWER EXTREMITY ANGIOGRAPHY Left 10/19/2018   Procedure: Lower Extremity Angiography;  Surgeon: Algernon Huxley, MD;  Location: [place] [facility];  Service: Cardiovascular;  Laterality: Left;   LOWER EXTREMITY ANGIOGRAPHY Left 10/20/2018   Procedure: LOWER EXTREMITY ANGIOGRAPHY;  Surgeon: Algernon Huxley, MD;  Location: [place] [facility];  Service: Cardiovascular;  Laterality: Left;   LOWER EXTREMITY ANGIOGRAPHY Right  11/25/2018   Procedure: LOWER EXTREMITY ANGIOGRAPHY;  Surgeon: Algernon Huxley, MD;  Location: [place] [facility];  Service: Cardiovascular;  Laterality: Right;   LOWER EXTREMITY ANGIOGRAPHY Right 01/10/2020   Procedure: LOWER EXTREMITY ANGIOGRAPHY;  Surgeon: Algernon Huxley, MD;  Location: [place] [facility];  Service: Cardiovascular;  Laterality: Right;   LOWER EXTREMITY ANGIOGRAPHY Right 12/11/2020   Procedure: LOWER EXTREMITY ANGIOGRAPHY;  Surgeon: Algernon Huxley, MD;  Location: [place] [facility];  Service: Cardiovascular;  Laterality: Right;   LOWER EXTREMITY INTERVENTION  02/12/2018   Procedure: LOWER EXTREMITY INTERVENTION;  Surgeon: Algernon Huxley, MD;  Location: [place] [facility];  Service: Cardiovascular;;   NECK SURGERY     TRANSMETATARSAL AMPUTATION Left 10/16/2018   Procedure: TRANSMETATARSAL AMPUTATION LEFT FOOT;  Surgeon: Samara Deist, DPM;  Location: [facility];  Service: Podiatry;  Laterality: Left;     Social History   Tobacco Use   Smoking status: Former    Packs/day: 1.00    Years: 30.00    Pack years: 30.00    Types: Cigarettes    Quit date: 10/07/2018    Years since quitting: 2.7   Smokeless tobacco: Never  Vaping Use   Vaping Use: Never used  Substance Use Topics   Alcohol use: No   Drug use: No      Family History  Problem Relation Age of Onset   Diabetes Mother    Coronary artery disease Father    Hypertension Sister    Leukemia Brother      No Known Allergies  REVIEW OF SYSTEMS (Negative unless checked)   Constitutional: '[]' Weight loss  '[]' Fever  '[]' Chills Cardiac: '[]' Chest pain   '[]' Chest pressure   '[]' Palpitations   '[]' Shortness of breath when laying flat   '[]' Shortness of breath at rest   '[]' Shortness of breath with exertion. Vascular:  '[]' Pain in legs with walking   '[]' Pain in legs at rest   '[]' Pain in legs when laying flat   '[]' Claudication   '[]' Pain in feet when walking  '[]' Pain in feet at rest  '[]' Pain in feet when laying flat   '[]' History of DVT   '[]' Phlebitis    '[x]' Swelling in legs   '[]' Varicose veins   '[x]' Non-healing ulcers Pulmonary:   '[]' Uses home oxygen   '[]' Productive cough   '[]' Hemoptysis   '[]' Wheeze  '[]' COPD   '[]' Asthma Neurologic:  '[]' Dizziness  '[]' Blackouts   '[]' Seizures   '[]' History of stroke   '[]' History of TIA  '[]' Aphasia   '[]' Temporary blindness   '[]' Dysphagia   '[]' Weakness or numbness in arms   '[]' Weakness or numbness in legs Musculoskeletal:  '[x]' Arthritis   '[]' Joint swelling   '[x]' Joint pain   '[]' Low back pain Hematologic:  '[]' Easy bruising  '[]' Easy bleeding   '[]' Hypercoagulable state   '[]' Anemic  Gastrointestinal:  '[]' Blood in stool   '[]' Vomiting blood  '[]' Gastroesophageal reflux/heartburn   '[]' Abdominal pain Genitourinary:  '[]' Chronic kidney disease   '[]' Difficult urination  '[]' Frequent urination  '[]' Burning with urination   '[]' Hematuria Skin:  '[]' Rashes   '[x]' Ulcers   '[x]' Wounds Psychological:  '[]' History of anxiety   '[]'  History of major depression.  Physical Examination  BP 123/78   Pulse 86   Ht '6\' 4"'  (1.93 m)   Wt 240 lb (108.9 kg)   BMI 29.21 kg/m  Gen:  WD/WN, NAD Head: [place]/AT, No temporalis wasting. Ear/Nose/Throat: Hearing grossly intact, nares w/o erythema or drainage Eyes: Conjunctiva clear. Sclera non-icteric Neck: Supple.  Trachea midline Pulmonary:  Good air movement, no use of accessory muscles.  Cardiac: RRR, no JVD Vascular: Vessel Right Left  Radial Palpable Palpable                          PT Not Palpable Not Palpable  DP 1+ Palpable Not Palpable   Gastrointestinal: soft, non-tender/non-distended. No guarding/reflex.  Musculoskeletal: M/S 5/5 throughout.  No deformity or atrophy. Left leg amputation with prosthesis in place. Multiple ulcers in the right calf and lower leg.  2+ right lower extremity edema. Neurologic: Sensation grossly intact in extremities.  Symmetrical.  Speech is fluent.  Psychiatric: Judgment intact, Mood & affect appropriate for pt's clinical situation. Dermatologic: multiple ulcerations on the right lower  leg.      Labs No results found for this or any previous visit (from the past 2160 hour(s)).  Radiology No results found.  Assessment/Plan Essential hypertension blood pressure control important in reducing the progression of atherosclerotic disease. On appropriate oral medications.       Diabetes ([place]) blood glucose control important in reducing the progression of atherosclerotic disease. Also, involved in wound healing. On appropriate medications.     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  We will reassess this in a few weeks.  That should help control his swelling get his ulcerations healed.  Atherosclerosis of native arteries of the extremities with ulceration ([place]) His ABI today is down a little 0.74 on the right with a digit pressure of 70.  His waveforms are monophasic.  He should have adequate flow for healing at the calf level, but if his ulcers do not heal somewhat promptly we may have to consider another angiogram with intervention of the right leg.    Leotis Pain, MD  07/13/2021 10:28 AM    This note was created with Dragon medical transcription system.  Any errors from dictation are purely unintentional

## 2021-07-20 ENCOUNTER — Ambulatory Visit (INDEPENDENT_AMBULATORY_CARE_PROVIDER_SITE_OTHER): Payer: 59 | Admitting: Nurse Practitioner

## 2021-07-20 ENCOUNTER — Other Ambulatory Visit: Payer: Self-pay

## 2021-07-20 ENCOUNTER — Encounter (INDEPENDENT_AMBULATORY_CARE_PROVIDER_SITE_OTHER): Payer: Self-pay | Admitting: Nurse Practitioner

## 2021-07-20 VITALS — BP 147/76 | HR 85 | Resp 16

## 2021-07-20 DIAGNOSIS — I7025 Atherosclerosis of native arteries of other extremities with ulceration: Secondary | ICD-10-CM

## 2021-07-20 NOTE — Progress Notes (Signed)
History of Present Illness  There is no documented history at this time  Assessments & Plan   There are no diagnoses linked to this encounter.    Additional instructions  Subjective:  Patient presents with venous ulcer of the Right lower extremity.    Procedure:  3 layer unna wrap was placed Right lower extremity.   Plan:   Follow up in one week.   

## 2021-07-21 ENCOUNTER — Encounter (INDEPENDENT_AMBULATORY_CARE_PROVIDER_SITE_OTHER): Payer: Self-pay | Admitting: Nurse Practitioner

## 2021-07-24 ENCOUNTER — Telehealth (INDEPENDENT_AMBULATORY_CARE_PROVIDER_SITE_OTHER): Payer: Self-pay

## 2021-07-24 NOTE — Telephone Encounter (Signed)
Home health orders were faxed over to [facility] to start right lower extremity unna wraps.

## 2021-07-25 ENCOUNTER — Telehealth (INDEPENDENT_AMBULATORY_CARE_PROVIDER_SITE_OTHER): Payer: Self-pay | Admitting: Nurse Practitioner

## 2021-07-25 NOTE — Telephone Encounter (Signed)
Verbal order for? He had Unna wraps done on 07/20/21, so they may be for that

## 2021-07-25 NOTE — Telephone Encounter (Signed)
Verbal order request by Abby from [facility] is fine to start unna wrap on Monday. Patient was made aware that the nurse will be coming out on Monday and to expecting a call.

## 2021-07-25 NOTE — Telephone Encounter (Signed)
Called to get verbal orders for patient. Paitnet was last seen 07/20/21.Please advise.

## 2021-07-25 NOTE — Telephone Encounter (Signed)
Unna boot** sorry I thought I put that in there ; she went out to patients temporary address and could not get in touch with him via phone

## 2021-07-27 ENCOUNTER — Encounter (INDEPENDENT_AMBULATORY_CARE_PROVIDER_SITE_OTHER): Payer: 59

## 2021-07-30 ENCOUNTER — Other Ambulatory Visit: Payer: Self-pay | Admitting: *Deleted

## 2021-07-30 DIAGNOSIS — Z87891 Personal history of nicotine dependence: Secondary | ICD-10-CM

## 2021-08-02 ENCOUNTER — Telehealth (INDEPENDENT_AMBULATORY_CARE_PROVIDER_SITE_OTHER): Payer: Self-pay

## 2021-08-02 NOTE — Telephone Encounter (Signed)
Amirah from [facility] left voicemail requesting verbal orders start next week. She requesting two times a week for 2 weeks and one time a week for three weeks. Verbal orders are fine to start per Sheppard Plumber NP

## 2021-08-08 ENCOUNTER — Telehealth (INDEPENDENT_AMBULATORY_CARE_PROVIDER_SITE_OTHER): Payer: Self-pay

## 2021-08-08 NOTE — Telephone Encounter (Signed)
That is fine 

## 2021-08-08 NOTE — Telephone Encounter (Signed)
Austin [name] has been made aware

## 2021-08-13 ENCOUNTER — Ambulatory Visit (INDEPENDENT_AMBULATORY_CARE_PROVIDER_SITE_OTHER): Payer: 59 | Admitting: Nurse Practitioner

## 2021-08-13 ENCOUNTER — Other Ambulatory Visit: Payer: Self-pay

## 2021-08-13 ENCOUNTER — Encounter (INDEPENDENT_AMBULATORY_CARE_PROVIDER_SITE_OTHER): Payer: Self-pay | Admitting: Nurse Practitioner

## 2021-08-13 VITALS — BP 137/86 | HR 91 | Resp 16

## 2021-08-13 DIAGNOSIS — I10 Essential (primary) hypertension: Secondary | ICD-10-CM

## 2021-08-13 DIAGNOSIS — I7025 Atherosclerosis of native arteries of other extremities with ulceration: Secondary | ICD-10-CM | POA: Diagnosis not present

## 2021-08-13 DIAGNOSIS — L97211 Non-pressure chronic ulcer of right calf limited to breakdown of skin: Secondary | ICD-10-CM

## 2021-08-19 ENCOUNTER — Encounter (INDEPENDENT_AMBULATORY_CARE_PROVIDER_SITE_OTHER): Payer: Self-pay | Admitting: Nurse Practitioner

## 2021-08-19 NOTE — Progress Notes (Signed)
Subjective:    Patient ID: Austin [name], male    DOB: [birth year], 65 y.o.   MRN: [id number] Chief Complaint  Patient presents with   Follow-up    4wk unna boot check    Austin [name] is a 65 year old male that presents today for follow-up evaluation of lower extremity swelling.  He notes that the swelling is improved and there is still some small shallow ulcerations.  [name] bigger concern is of his left lower extremity stump.  It has been rubbing on his prosthetic and it has formed a wound.  This wound has been slow to heal.  He does have an upcoming evaluation with [facility] with prosthetic fitting to ensure no fit issues.   Review of Systems  Cardiovascular:  Positive for leg swelling.  Skin:  Positive for wound.  All other systems reviewed and are negative.     Objective:   Physical Exam Vitals reviewed.  HENT:     Head: Normocephalic.  Cardiovascular:     Rate and Rhythm: Normal rate.  Pulmonary:     Effort: Pulmonary effort is normal.  Musculoskeletal:     Right lower leg: Edema present.     Left Lower Extremity: Left leg is amputated below knee.  Skin:    General: Skin is warm and dry.  Neurological:     Mental Status: He is alert and oriented to person, place, and time.  Psychiatric:        Mood and Affect: Mood normal.        Behavior: Behavior normal.        Thought Content: Thought content normal.        Judgment: Judgment normal.    BP 137/86 (BP Location: Left Arm)   Pulse 91   Resp 16   Past Medical History:  Diagnosis Date   Coronary artery disease    Diabetes (HCC)    DVT (deep venous thrombosis) (HCC)    GERD (gastroesophageal reflux disease)    Hyperlipidemia    Hypertension    Peripheral vascular disease (HCC)     Social History   Socioeconomic History   Marital status: Divorced    Spouse name: Not on file   Number of children: Not on file   Years of education: Not on file   Highest education level: Not on file   Occupational History   Not on file  Tobacco Use   Smoking status: Former    Packs/day: 1.00    Years: 30.00    Pack years: 30.00    Types: Cigarettes    Quit date: 10/07/2018    Years since quitting: 2.8   Smokeless tobacco: Never  Vaping Use   Vaping Use: Never used  Substance and Sexual Activity   Alcohol use: No   Drug use: No   Sexual activity: Not Currently  Other Topics Concern   Not on file  Social History Narrative   Not on file   Social Determinants of Health   Financial Resource Strain: Not on file  Food Insecurity: Not on file  Transportation Needs: Not on file  Physical Activity: Not on file  Stress: Not on file  Social Connections: Not on file  Intimate Partner Violence: Not on file    Past Surgical History:  Procedure Laterality Date   AMPUTATION Left 10/28/2018   Procedure: AMPUTATION BELOW KNEE;  Surgeon: Algernon Huxley, MD;  Location: [facility];  Service: Vascular;  Laterality: Left;   AMPUTATION TOE Left 02/10/2018  Procedure: AMPUTATION TOE;  Surgeon: Sharlotte Alamo, DPM;  Location: [facility];  Service: Podiatry;  Laterality: Left;   APPLICATION OF WOUND VAC Left 10/16/2018   Procedure: APPLICATION OF WOUND VAC;  Surgeon: Samara Deist, DPM;  Location: [facility];  Service: Podiatry;  Laterality: Left;   DORSAL SLIT N/A 01/12/2021   Procedure: DORSAL SLIT;  Surgeon: Billey Co, MD;  Location: [facility];  Service: Urology;  Laterality: N/A;   groin surgery     IRRIGATION AND DEBRIDEMENT FOOT Left 09/30/2018   Procedure: IRRIGATION AND DEBRIDEMENT FOOT;  Surgeon: Sharlotte Alamo, DPM;  Location: [facility];  Service: Podiatry;  Laterality: Left;   LOWER EXTREMITY ANGIOGRAPHY Left 02/12/2018   Procedure: Lower Extremity Angiography;  Surgeon: Algernon Huxley, MD;  Location: [facility];  Service: Cardiovascular;  Laterality: Left;   LOWER EXTREMITY ANGIOGRAPHY Left 10/01/2018   Procedure: Lower Extremity Angiography;  Surgeon: Algernon Huxley, MD;  Location: [place];  Service: Cardiovascular;  Laterality: Left;   LOWER EXTREMITY ANGIOGRAPHY Left 10/19/2018   Procedure: Lower Extremity Angiography;  Surgeon: Algernon Huxley, MD;  Location: [place];  Service: Cardiovascular;  Laterality: Left;   LOWER EXTREMITY ANGIOGRAPHY Left 10/20/2018   Procedure: LOWER EXTREMITY ANGIOGRAPHY;  Surgeon: Algernon Huxley, MD;  Location: [place] [facility];  Service: Cardiovascular;  Laterality: Left;   LOWER EXTREMITY ANGIOGRAPHY Right 11/25/2018   Procedure: LOWER EXTREMITY ANGIOGRAPHY;  Surgeon: Algernon Huxley, MD;  Location: [facility];  Service: Cardiovascular;  Laterality: Right;   LOWER EXTREMITY ANGIOGRAPHY Right 01/10/2020   Procedure: LOWER EXTREMITY ANGIOGRAPHY;  Surgeon: Algernon Huxley, MD;  Location: [facility];  Service: Cardiovascular;  Laterality: Right;   LOWER EXTREMITY ANGIOGRAPHY Right 12/11/2020   Procedure: LOWER EXTREMITY ANGIOGRAPHY;  Surgeon: Algernon Huxley, MD;  Location: [facility];  Service: Cardiovascular;  Laterality: Right;   LOWER EXTREMITY INTERVENTION  02/12/2018   Procedure: LOWER EXTREMITY INTERVENTION;  Surgeon: Algernon Huxley, MD;  Location: [facility];  Service: Cardiovascular;;   NECK SURGERY     TRANSMETATARSAL AMPUTATION Left 10/16/2018   Procedure: TRANSMETATARSAL AMPUTATION LEFT FOOT;  Surgeon: Samara Deist, DPM;  Location: [facility];  Service: Podiatry;  Laterality: Left;    Family History  Problem Relation Age of Onset   Diabetes Mother    Coronary artery disease Father    Hypertension Sister    Leukemia Brother     No Known Allergies  CBC Latest Ref Rng & Units 03/31/2021 03/30/2021 01/11/2021  WBC 4.0 - 10.5 K/uL 8.4 8.2 8.8  Hemoglobin 13.0 - 17.0 g/dL 13.9 15.3 14.2  Hematocrit 39.0 - 52.0 % 41.3 45.7 43.2  Platelets 150 - 400 K/uL 199 231 240      CMP     Component Value Date/Time   NA 139 04/02/2021 0557   NA 135 (L) 11/17/2013 0621   K 3.8 04/02/2021 0557   K  4.3 11/17/2013 0621   CL 110 04/02/2021 0557   CL 99 11/17/2013 0621   CO2 23 04/02/2021 0557   CO2 31 11/17/2013 0621   GLUCOSE 130 (H) 04/02/2021 0557   GLUCOSE 172 (H) 11/17/2013 0621   BUN 8 04/02/2021 0557   BUN 10 11/17/2013 0621   CREATININE 0.71 04/02/2021 0557   CREATININE 0.85 11/17/2013 0621   CALCIUM 8.8 (L) 04/02/2021 0557   CALCIUM 9.6 11/17/2013 0621   PROT 7.4 03/30/2021 0958   PROT 8.5 (  H) 01/10/2013 2155   ALBUMIN 3.6 03/30/2021 0958   ALBUMIN 4.0 01/10/2013 2155   AST 24 03/30/2021 0958   AST 16 01/10/2013 2155   ALT 21 03/30/2021 0958   ALT 26 01/10/2013 2155   ALKPHOS 62 03/30/2021 0958   ALKPHOS 91 01/10/2013 2155   BILITOT 0.8 03/30/2021 0958   BILITOT 0.3 01/10/2013 2155   GFRNONAA >60 04/02/2021 0557   GFRNONAA >60 11/17/2013 0621   GFRAA >60 01/10/2020 0808   GFRAA >60 11/17/2013 0621     VAS [place] ABI WITH/WO TBI  Result Date: 07/24/2021  LOWER EXTREMITY DOPPLER STUDY Patient Name:  DEL OVERFELT  Date of Exam:   07/13/2021 Medical Rec #: [id number]         Accession #:    [id number] Date of Birth: [birth year]         Patient Gender: M Patient Age:   7 years Exam Location:  [facility] Procedure:      VAS [place] ABI WITH/WO TBI Referring Phys: Eulogio Ditch --------------------------------------------------------------------------------  Indications: Peripheral artery disease.  Vascular Interventions: H/O mulitple left leg revascularizations with left BKA                         on 10/28/18;                         11/25/18: Right SFA, popliteal & TP trunk PTAs;                         01/10/20: Right SFA stent with right posterior tibial &                         peroneal artery PTAs;                         12/11/20: Left EIA stent with right TP trunk & peroneal                         PTAs;. Performing Technologist: Blondell Reveal RT, RDMS, RVT  Examination Guidelines: A complete evaluation includes at minimum, Doppler waveform signals and systolic  blood pressure reading at the level of bilateral brachial, anterior tibial, and posterior tibial arteries, when vessel segments are accessible. Bilateral testing is considered an integral part of a complete examination. Photoelectric Plethysmograph (PPG) waveforms and toe systolic pressure readings are included as required and additional duplex testing as needed. Limited examinations for reoccurring indications may be performed as noted.  ABI Findings: +---------+------------------+-----+-------------------+-----------------------+ Right    Rt Pressure (mmHg)IndexWaveform           Comment                 +---------+------------------+-----+-------------------+-----------------------+ Brachial 144                                                               +---------+------------------+-----+-------------------+-----------------------+ CFA                             monophasic                                 +---------+------------------+-----+-------------------+-----------------------+  Popliteal                       monophasic                                 +---------+------------------+-----+-------------------+-----------------------+ ATA      107               0.74 monophasic                                 +---------+------------------+-----+-------------------+-----------------------+ PTA      63                0.44 dampened monophasicdetected via duplex                                                        with collateral present +---------+------------------+-----+-------------------+-----------------------+ Great Toe70                0.49 Abnormal                                   +---------+------------------+-----+-------------------+-----------------------+ +-------+-----------+-----------+------------+------------+ ABI/TBIToday's ABIToday's TBIPrevious ABIPrevious TBI +-------+-----------+-----------+------------+------------+ Right  0.74        0.49       0.85        0.69         +-------+-----------+-----------+------------+------------+ Left   NA                    NA                       +-------+-----------+-----------+------------+------------+ LIMITED DUPLEX: Monophasic flow noted throughout the right lower extremity arterial system. PTA pressure obtained using duplex scanner.  Mild decrease of the right ABI when compared to the previous exam on 04/06/21.  Summary: Right: Resting right ankle-brachial index indicates moderate right lower extremity arterial disease which appears to be possible originating at the iliac level based on Doppler waveforms. The right toe-brachial index is abnormal.  *See table(s) above for measurements and observations.  Electronically signed by Leotis Pain MD on 07/24/2021 at 9:05:51 AM.    Final        Assessment & Plan:   1. Skin ulcer of right calf, limited to breakdown of skin (HCC) Today the patient's wounds on the right lower extremity are mostly healed at this time.  He still has some continued swelling.  We will continue to have the patient in wraps and reevaluate in 4 weeks.  2. Essential hypertension Continue antihypertensive medications as already ordered, these medications have been reviewed and there are no changes at this time.   3. Atherosclerosis of native arteries of the extremities with ulceration ([place]) While the patient return with noninvasive studies of his stump to ensure there is no decreased perfusion leading to the slow healing of his wound.   Current Outpatient Medications on File Prior to Visit  Medication Sig Dispense Refill   ACCU-CHEK GUIDE test strip      apixaban (ELIQUIS) 5 MG TABS tablet Take 1 tablet (5 mg total) by mouth 2 (two) times daily. [place]  tablet O   aspirin EC 81 MG tablet Take 1 tablet (81 mg total) by mouth daily. 150 tablet 2   atorvastatin (LIPITOR) 80 MG tablet Take 1 tablet (80 mg total) by mouth daily at 6 PM. 30 tablet 0   Blood Glucose  Monitoring Suppl (ACCU-CHEK GUIDE) w/Device KIT USE 1 KIT AS DIRECTED FOUR TIMES A DAY     Docusate Sodium (DSS) 100 MG CAPS Take 100 mg by mouth in the morning and at bedtime.     HUMALOG KWIKPEN 100 UNIT/ML KwikPen Inject 24 Units into the skin 3 (three) times daily before meals.     insulin aspart (NOVOLOG) 100 UNIT/ML injection Inject 10 Units into the skin 3 (three) times daily with meals. 10 mL 0   insulin glargine (LANTUS) 100 UNIT/ML injection Inject 0.3 mLs (30 Units total) into the skin at bedtime. 10 mL 11   insulin glargine, 1 Unit Dial, (TOUJEO SOLOSTAR) 300 UNIT/ML Solostar Pen Inject 24 Units into the skin at bedtime.     losartan (COZAAR) 25 MG tablet Take 25 mg by mouth daily.     polyethylene glycol (MIRALAX) packet Take 17 g by mouth daily. 14 each 0   TRUEPLUS 5-BEVEL PEN NEEDLES 31G X 6 MM MISC      No current facility-administered medications on file prior to visit.    There are no Patient Instructions on file for this visit. No follow-ups on file.   Kris Hartmann, NP

## 2021-08-21 ENCOUNTER — Encounter: Payer: Self-pay | Admitting: Acute Care

## 2021-08-21 ENCOUNTER — Other Ambulatory Visit: Payer: Self-pay

## 2021-08-21 ENCOUNTER — Ambulatory Visit (INDEPENDENT_AMBULATORY_CARE_PROVIDER_SITE_OTHER): Payer: 59 | Admitting: Acute Care

## 2021-08-21 ENCOUNTER — Ambulatory Visit
Admission: RE | Admit: 2021-08-21 | Discharge: 2021-08-21 | Disposition: A | Discharge status: Home / Self Care | Payer: 59 | Source: Ambulatory Visit | Attending: Acute Care | Admitting: Acute Care

## 2021-08-21 DIAGNOSIS — Z87891 Personal history of nicotine dependence: Secondary | ICD-10-CM

## 2021-08-21 NOTE — Progress Notes (Signed)
Virtual Visit via Telephone Note  I connected with Austin [name] on 08/21/21 at  1:00 PM EST by telephone and verified that I am speaking with the correct person using two identifiers.  Location: Patient: Home Provider: Working form home    I discussed the limitations, risks, security and privacy concerns of performing an evaluation and management service by telephone and the availability of in person appointments. I also discussed with the patient that there may be a patient responsible charge related to this service. The patient expressed understanding and agreed to proceed.  Shared Decision Making Visit Lung Cancer Screening Program [phone])   Eligibility: Age 65 y.o. Pack Years Smoking History Calculation 47 (# packs/per year x # years smoked) Recent History of coughing up blood  no Unexplained weight loss? no ( >Than 15 pounds within the last 6 months ) Prior History Lung / other cancer no (Diagnosis within the last 5 years already requiring surveillance chest CT Scans). Smoking Status Former Smoker Former Smokers: Years since quit: < 1 year  Quit Date: 01/2021  Visit Components: Discussion included one or more decision making aids. yes Discussion included risk/benefits of screening. yes Discussion included potential follow up diagnostic testing for abnormal scans. yes Discussion included meaning and risk of over diagnosis. yes Discussion included meaning and risk of False Positives. yes Discussion included meaning of total radiation exposure. yes  Counseling Included: Importance of adherence to annual lung cancer LDCT screening. yes Impact of comorbidities on ability to participate in the program. yes Ability and willingness to under diagnostic treatment. yes  Smoking Cessation Counseling: Current Smokers:  Discussed importance of smoking cessation. yes Information about tobacco cessation classes and interventions provided to patient. yes Patient provided with  "ticket" for LDCT Scan. yes Symptomatic Patient. no  Counseling NA Diagnosis Code: Tobacco Use Z72.0 Asymptomatic Patient yes  Counseling (Intermediate counseling: > three minutes counseling) U0454 Former Smokers:  Discussed the importance of maintaining cigarette abstinence. yes Diagnosis Code: Personal History of Nicotine Dependence. U98.119 Information about tobacco cessation classes and interventions provided to patient. Yes Patient provided with "ticket" for LDCT Scan. yes Written Order for Lung Cancer Screening with LDCT placed in Epic. Yes (CT Chest Lung Cancer Screening Low Dose W/O CM) [id number] Z12.2-Screening of respiratory organs Z87.891-Personal history of nicotine dependence  I spent 25 minutes of face to face time with him discussing the risks and benefits of lung cancer screening. We viewed a power point together that explained in detail the above noted topics. We took the time to pause the power point at intervals to allow for questions to be asked and answered to ensure understanding. We discussed that he had taken the single most powerful action possible to decrease his risk of developing lung cancer when he quit smoking. I counseled hm to remain smoke free, and to contact me if me ever had the desire to smoke again so that I can provide resources and tools to help support the effort to remain smoke free. We discussed the time and location of the scan, and that either  Abigail Miyamoto RN or I will call with the results within  24-48 hours of receiving them. He has my card and contact information in the event he needs to speak with me, in addition to a copy of the power point we reviewed as a resource. He verbalized understanding of all of the above and had no further questions upon leaving the office.     I explained to the patient that  there has been a high incidence of coronary artery disease noted on these exams. I explained that this is a non-gated exam therefore degree or  severity cannot be determined. This patient is on statin therapy. I have asked the patient to follow-up with their PCP regarding any incidental finding of coronary artery disease and management with diet or medication as they feel is clinically indicated. The patient verbalized understanding of the above and had no further questions.   Jalana Moore D. Tiburcio Pea, NP-C  Pulmonary & Critical Care Personal contact information can be found on Amion  08/21/2021, 12:57 PM

## 2021-08-28 ENCOUNTER — Other Ambulatory Visit: Payer: Self-pay | Admitting: Acute Care

## 2021-08-28 DIAGNOSIS — Z87891 Personal history of nicotine dependence: Secondary | ICD-10-CM

## 2021-09-05 ENCOUNTER — Encounter (HOSPITAL_COMMUNITY): Payer: Self-pay | Admitting: *Deleted

## 2021-09-05 ENCOUNTER — Encounter (HOSPITAL_COMMUNITY): Payer: Self-pay

## 2021-09-05 ENCOUNTER — Emergency Department (HOSPITAL_COMMUNITY): Payer: 59

## 2021-09-05 ENCOUNTER — Telehealth (INDEPENDENT_AMBULATORY_CARE_PROVIDER_SITE_OTHER): Payer: Self-pay

## 2021-09-05 ENCOUNTER — Inpatient Hospital Stay (HOSPITAL_COMMUNITY)
Admission: EM | Admit: 2021-09-05 | Discharge: 2021-09-20 | DRG: 982 | Disposition: A | Discharge status: Skilled Nursing Facility | Payer: 59 | Attending: Internal Medicine | Admitting: Internal Medicine

## 2021-09-05 ENCOUNTER — Other Ambulatory Visit: Payer: Self-pay

## 2021-09-05 ENCOUNTER — Ambulatory Visit (HOSPITAL_COMMUNITY)
Admission: EM | Admit: 2021-09-05 | Discharge: 2021-09-05 | Disposition: A | Discharge status: Home / Self Care | Payer: 59

## 2021-09-05 DIAGNOSIS — L89151 Pressure ulcer of sacral region, stage 1: Secondary | ICD-10-CM | POA: Diagnosis present

## 2021-09-05 DIAGNOSIS — L03012 Cellulitis of left finger: Secondary | ICD-10-CM | POA: Diagnosis not present

## 2021-09-05 DIAGNOSIS — Z833 Family history of diabetes mellitus: Secondary | ICD-10-CM

## 2021-09-05 DIAGNOSIS — I10 Essential (primary) hypertension: Secondary | ICD-10-CM | POA: Diagnosis present

## 2021-09-05 DIAGNOSIS — Z89422 Acquired absence of other left toe(s): Secondary | ICD-10-CM

## 2021-09-05 DIAGNOSIS — E1152 Type 2 diabetes mellitus with diabetic peripheral angiopathy with gangrene: Secondary | ICD-10-CM | POA: Diagnosis present

## 2021-09-05 DIAGNOSIS — L089 Local infection of the skin and subcutaneous tissue, unspecified: Secondary | ICD-10-CM

## 2021-09-05 DIAGNOSIS — E11628 Type 2 diabetes mellitus with other skin complications: Secondary | ICD-10-CM

## 2021-09-05 DIAGNOSIS — E1151 Type 2 diabetes mellitus with diabetic peripheral angiopathy without gangrene: Secondary | ICD-10-CM | POA: Diagnosis present

## 2021-09-05 DIAGNOSIS — Z8739 Personal history of other diseases of the musculoskeletal system and connective tissue: Secondary | ICD-10-CM

## 2021-09-05 DIAGNOSIS — Z794 Long term (current) use of insulin: Secondary | ICD-10-CM | POA: Diagnosis not present

## 2021-09-05 DIAGNOSIS — M86142 Other acute osteomyelitis, left hand: Secondary | ICD-10-CM | POA: Diagnosis present

## 2021-09-05 DIAGNOSIS — E1165 Type 2 diabetes mellitus with hyperglycemia: Secondary | ICD-10-CM | POA: Diagnosis present

## 2021-09-05 DIAGNOSIS — E119 Type 2 diabetes mellitus without complications: Secondary | ICD-10-CM

## 2021-09-05 DIAGNOSIS — Z8249 Family history of ischemic heart disease and other diseases of the circulatory system: Secondary | ICD-10-CM

## 2021-09-05 DIAGNOSIS — K5900 Constipation, unspecified: Secondary | ICD-10-CM | POA: Diagnosis present

## 2021-09-05 DIAGNOSIS — Z87891 Personal history of nicotine dependence: Secondary | ICD-10-CM

## 2021-09-05 DIAGNOSIS — E1169 Type 2 diabetes mellitus with other specified complication: Secondary | ICD-10-CM | POA: Diagnosis not present

## 2021-09-05 DIAGNOSIS — Z7982 Long term (current) use of aspirin: Secondary | ICD-10-CM

## 2021-09-05 DIAGNOSIS — I2510 Atherosclerotic heart disease of native coronary artery without angina pectoris: Secondary | ICD-10-CM | POA: Diagnosis present

## 2021-09-05 DIAGNOSIS — B958 Unspecified staphylococcus as the cause of diseases classified elsewhere: Secondary | ICD-10-CM | POA: Diagnosis present

## 2021-09-05 DIAGNOSIS — Z8679 Personal history of other diseases of the circulatory system: Secondary | ICD-10-CM | POA: Diagnosis not present

## 2021-09-05 DIAGNOSIS — Z20822 Contact with and (suspected) exposure to covid-19: Secondary | ICD-10-CM | POA: Diagnosis present

## 2021-09-05 DIAGNOSIS — Z89512 Acquired absence of left leg below knee: Secondary | ICD-10-CM

## 2021-09-05 DIAGNOSIS — I4891 Unspecified atrial fibrillation: Secondary | ICD-10-CM | POA: Diagnosis present

## 2021-09-05 DIAGNOSIS — E785 Hyperlipidemia, unspecified: Secondary | ICD-10-CM | POA: Diagnosis present

## 2021-09-05 DIAGNOSIS — Z7901 Long term (current) use of anticoagulants: Secondary | ICD-10-CM

## 2021-09-05 DIAGNOSIS — E872 Acidosis, unspecified: Secondary | ICD-10-CM | POA: Diagnosis present

## 2021-09-05 DIAGNOSIS — Z79899 Other long term (current) drug therapy: Secondary | ICD-10-CM

## 2021-09-05 DIAGNOSIS — I739 Peripheral vascular disease, unspecified: Secondary | ICD-10-CM | POA: Diagnosis present

## 2021-09-05 NOTE — ED Notes (Signed)
Patient is being discharged from the Urgent Care and sent to the Emergency Department via POV with spouse . Per Marlowe Shores PA, patient is in need of higher level of care due to Osteomyelitis. Patient is aware and verbalizes understanding of plan of care.  Vitals:   09/05/21 1616  BP: 131/89  Pulse: 99  Resp: 20  Temp: 98.3 F (36.8 C)  SpO2: 97%

## 2021-09-05 NOTE — ED Provider Notes (Signed)
Emergency Medicine Provider Triage Evaluation Note  Austin [name] , a 65 y.o. male  was evaluated in triage.  Pt sent to the emergency department from urgent care due to left finger swelling and erythema.  Reports that this has been going on for 2 days.  He has a history of osteomyelitis requiring left lower extremity amputation.  Review of Systems  Positive: Pain Negative: Fevers or chills  Physical Exam  BP 135/84 (BP Location: Right Arm)   Pulse 100   Temp 98.8 F (37.1 C) (Oral)   Resp 18   SpO2 99%  Gen:   Awake, no distress   Resp:  Normal effort  MSK:   Moves extremities without difficulty  Other:  Erythema and warmth to left hand.  Large amounts of inflammation.  Strong radial pulses  Medical Decision Making  Medically screening exam initiated at 7:26 PM.  Appropriate orders placed.  Edson D Weppler was informed that the remainder of the evaluation will be completed by another provider, this initial triage assessment does not replace that evaluation, and the importance of remaining in the ED until their evaluation is complete.     Woodroe Chen 09/05/21 1928    Charlynne Pander, MD 09/05/21 [phone]

## 2021-09-05 NOTE — Discharge Instructions (Addendum)
You have a severe infection of your left middle finger that requires more intervention than we can provide in the urgent care setting.  He has significant risk factors including a history of gangrene, type 2 diabetes treated with insulin. Please report to the emergency room now for further evaluation and intervention.

## 2021-09-05 NOTE — ED Triage Notes (Signed)
T REPORTS HE WOKE UP AND HAND WAS SWOLLEN. LT MIDDLE FINGER IS RED .

## 2021-09-05 NOTE — ED Triage Notes (Signed)
Pt reports left hand swelling x5-6 days. Pt reports going to UC and being sent here for further evaluation. Denies any known injury.

## 2021-09-05 NOTE — ED Provider Notes (Signed)
[name]   MRN: [id number] DOB: [birth year]  Subjective:   Austin [name] is a 65 y.o. male with pmh of osteomyelitis, gangrene, type 2 diabetes treated with insulin presenting for 1 day history of acute onset left finger swelling with redness and pain.  Patient states that he woke up and saw his finger that way.  No current facility-administered medications for this encounter.  Current Outpatient Medications:    ACCU-CHEK GUIDE test strip, , Disp: , Rfl:    apixaban (ELIQUIS) 5 MG TABS tablet, Take 1 tablet (5 mg total) by mouth 2 (two) times daily., Disp: 60 tablet, Rfl: O   aspirin EC 81 MG tablet, Take 1 tablet (81 mg total) by mouth daily., Disp: 150 tablet, Rfl: 2   atorvastatin (LIPITOR) 80 MG tablet, Take 1 tablet (80 mg total) by mouth daily at 6 PM., Disp: 30 tablet, Rfl: 0   Blood Glucose Monitoring Suppl (ACCU-CHEK GUIDE) w/Device KIT, USE 1 KIT AS DIRECTED FOUR TIMES A DAY, Disp: , Rfl:    Docusate Sodium (DSS) 100 MG CAPS, Take 100 mg by mouth in the morning and at bedtime., Disp: , Rfl:    HUMALOG KWIKPEN 100 UNIT/ML KwikPen, Inject 24 Units into the skin 3 (three) times daily before meals., Disp: , Rfl:    insulin aspart (NOVOLOG) 100 UNIT/ML injection, Inject 10 Units into the skin 3 (three) times daily with meals., Disp: 10 mL, Rfl: 0   insulin glargine (LANTUS) 100 UNIT/ML injection, Inject 0.3 mLs (30 Units total) into the skin at bedtime., Disp: 10 mL, Rfl: 11   insulin glargine, 1 Unit Dial, (TOUJEO SOLOSTAR) 300 UNIT/ML Solostar Pen, Inject 24 Units into the skin at bedtime., Disp: , Rfl:    losartan (COZAAR) 25 MG tablet, Take 25 mg by mouth daily., Disp: , Rfl:    polyethylene glycol (MIRALAX) packet, Take 17 g by mouth daily., Disp: 14 each, Rfl: 0   TRUEPLUS 5-BEVEL PEN NEEDLES 31G X 6 MM MISC, , Disp: , Rfl:    No Known Allergies  Past Medical History:  Diagnosis Date   Coronary artery disease    Diabetes (HCC)    DVT (deep venous  thrombosis) (HCC)    GERD (gastroesophageal reflux disease)    Hyperlipidemia    Hypertension    Peripheral vascular disease (HCC)      Past Surgical History:  Procedure Laterality Date   AMPUTATION Left 10/28/2018   Procedure: AMPUTATION BELOW KNEE;  Surgeon: Annice Needy, MD;  Location: [facility];  Service: Vascular;  Laterality: Left;   AMPUTATION TOE Left 02/10/2018   Procedure: AMPUTATION TOE;  Surgeon: Linus Galas, DPM;  Location: [facility];  Service: Podiatry;  Laterality: Left;   APPLICATION OF WOUND VAC Left 10/16/2018   Procedure: APPLICATION OF WOUND VAC;  Surgeon: Gwyneth Revels, DPM;  Location: [facility];  Service: Podiatry;  Laterality: Left;   DORSAL SLIT N/A 01/12/2021   Procedure: DORSAL SLIT;  Surgeon: Sondra Come, MD;  Location: [facility];  Service: Urology;  Laterality: N/A;   groin surgery     IRRIGATION AND DEBRIDEMENT FOOT Left 09/30/2018   Procedure: IRRIGATION AND DEBRIDEMENT FOOT;  Surgeon: Linus Galas, DPM;  Location: [facility];  Service: Podiatry;  Laterality: Left;   LOWER EXTREMITY ANGIOGRAPHY Left 02/12/2018   Procedure: Lower Extremity Angiography;  Surgeon: Annice Needy, MD;  Location: [facility];  Service: Cardiovascular;  Laterality: Left;   LOWER EXTREMITY ANGIOGRAPHY Left 10/01/2018  Procedure: Lower Extremity Angiography;  Surgeon: Algernon Huxley, MD;  Location: [place] [facility];  Service: Cardiovascular;  Laterality: Left;   LOWER EXTREMITY ANGIOGRAPHY Left 10/19/2018   Procedure: Lower Extremity Angiography;  Surgeon: Algernon Huxley, MD;  Location: [place] [facility];  Service: Cardiovascular;  Laterality: Left;   LOWER EXTREMITY ANGIOGRAPHY Left 10/20/2018   Procedure: LOWER EXTREMITY ANGIOGRAPHY;  Surgeon: Algernon Huxley, MD;  Location: [place] [facility];  Service: Cardiovascular;  Laterality: Left;   LOWER EXTREMITY ANGIOGRAPHY Right 11/25/2018   Procedure: LOWER EXTREMITY ANGIOGRAPHY;  Surgeon: Algernon Huxley, MD;  Location: [place] [facility];  Service: Cardiovascular;  Laterality: Right;   LOWER EXTREMITY ANGIOGRAPHY Right 01/10/2020   Procedure: LOWER EXTREMITY ANGIOGRAPHY;  Surgeon: Algernon Huxley, MD;  Location: [place] [facility];  Service: Cardiovascular;  Laterality: Right;   LOWER EXTREMITY ANGIOGRAPHY Right 12/11/2020   Procedure: LOWER EXTREMITY ANGIOGRAPHY;  Surgeon: Algernon Huxley, MD;  Location: [place] [facility];  Service: Cardiovascular;  Laterality: Right;   LOWER EXTREMITY INTERVENTION  02/12/2018   Procedure: LOWER EXTREMITY INTERVENTION;  Surgeon: Algernon Huxley, MD;  Location: [place] [facility];  Service: Cardiovascular;;   NECK SURGERY     TRANSMETATARSAL AMPUTATION Left 10/16/2018   Procedure: TRANSMETATARSAL AMPUTATION LEFT FOOT;  Surgeon: Samara Deist, DPM;  Location: [facility];  Service: Podiatry;  Laterality: Left;    Family History  Problem Relation Age of Onset   Diabetes Mother    Coronary artery disease Father    Hypertension Sister    Leukemia Brother     Social History   Tobacco Use   Smoking status: Former    Packs/day: 1.00    Years: 47.00    Pack years: 47.00    Types: Cigarettes    Quit date: 01/2021    Years since quitting: 0.6   Smokeless tobacco: Never  Vaping Use   Vaping Use: Never used  Substance Use Topics   Alcohol use: No   Drug use: No    ROS   Objective:   Vitals: BP 131/89   Pulse 99   Temp 98.3 F (36.8 C)   Resp 20   SpO2 97%   Physical Exam Constitutional:      General: He is not in acute distress.    Appearance: Normal appearance. He is well-developed and normal weight. He is not ill-appearing, toxic-appearing or diaphoretic.  HENT:     Head: Normocephalic and atraumatic.     Right Ear: External ear normal.     Left Ear: External ear normal.     Nose: Nose normal.     Mouth/Throat:     Pharynx: Oropharynx is clear.  Eyes:     General: No scleral icterus.       Right eye: No discharge.        Left eye: No discharge.     Extraocular  Movements: Extraocular movements intact.     Pupils: Pupils are equal, round, and reactive to light.  Cardiovascular:     Rate and Rhythm: Normal rate.  Pulmonary:     Effort: Pulmonary effort is normal.  Musculoskeletal:       Hands:     Cervical back: Normal range of motion.  Neurological:     Mental Status: He is alert and oriented to person, place, and time.  Psychiatric:        Mood and Affect: Mood normal.        Behavior: Behavior  normal.        Thought Content: Thought content normal.        Judgment: Judgment normal.    Assessment and Plan :   PDMP not reviewed this encounter.  1. Finger infection   2. History of gangrene   3. Type 2 diabetes mellitus treated with insulin ([place])   4. History of osteomyelitis    I am concerned for patient's finger given his history of gangrene, osteomyelitis amputation.  He has type 2 diabetes treated with insulin other risk factors.  He needs further evaluation and intervention that we can provide in the urgent care setting.  Recommended he present to the emergency room, his wife will take him there now.   Jaynee Eagles, PA-C 09/05/21 1701

## 2021-09-05 NOTE — Telephone Encounter (Signed)
Tracey from [facility] left voicemail stating that she went to the patient home for rle unna wrap change and notice that the patient left hand was swollen. The tip of the left index finger is black and the patient notice the swelling a few days ago. The nurse advise the patient to go to urgent care to be evaluated. Sheppard Plumber NP has been made aware.

## 2021-09-06 ENCOUNTER — Encounter (HOSPITAL_COMMUNITY): Payer: Self-pay | Admitting: Internal Medicine

## 2021-09-06 DIAGNOSIS — Z89422 Acquired absence of other left toe(s): Secondary | ICD-10-CM | POA: Diagnosis not present

## 2021-09-06 DIAGNOSIS — I4891 Unspecified atrial fibrillation: Secondary | ICD-10-CM | POA: Diagnosis present

## 2021-09-06 DIAGNOSIS — Z87891 Personal history of nicotine dependence: Secondary | ICD-10-CM | POA: Diagnosis not present

## 2021-09-06 DIAGNOSIS — I739 Peripheral vascular disease, unspecified: Secondary | ICD-10-CM | POA: Diagnosis present

## 2021-09-06 DIAGNOSIS — Z79899 Other long term (current) drug therapy: Secondary | ICD-10-CM | POA: Diagnosis not present

## 2021-09-06 DIAGNOSIS — L03012 Cellulitis of left finger: Secondary | ICD-10-CM | POA: Diagnosis present

## 2021-09-06 DIAGNOSIS — I2510 Atherosclerotic heart disease of native coronary artery without angina pectoris: Secondary | ICD-10-CM | POA: Diagnosis present

## 2021-09-06 DIAGNOSIS — L089 Local infection of the skin and subcutaneous tissue, unspecified: Secondary | ICD-10-CM | POA: Diagnosis present

## 2021-09-06 DIAGNOSIS — Z7901 Long term (current) use of anticoagulants: Secondary | ICD-10-CM | POA: Diagnosis not present

## 2021-09-06 DIAGNOSIS — K5900 Constipation, unspecified: Secondary | ICD-10-CM | POA: Diagnosis present

## 2021-09-06 DIAGNOSIS — Z20822 Contact with and (suspected) exposure to covid-19: Secondary | ICD-10-CM | POA: Diagnosis present

## 2021-09-06 DIAGNOSIS — L89151 Pressure ulcer of sacral region, stage 1: Secondary | ICD-10-CM | POA: Diagnosis present

## 2021-09-06 DIAGNOSIS — E782 Mixed hyperlipidemia: Secondary | ICD-10-CM | POA: Diagnosis not present

## 2021-09-06 DIAGNOSIS — Z8249 Family history of ischemic heart disease and other diseases of the circulatory system: Secondary | ICD-10-CM | POA: Diagnosis not present

## 2021-09-06 DIAGNOSIS — B958 Unspecified staphylococcus as the cause of diseases classified elsewhere: Secondary | ICD-10-CM | POA: Diagnosis present

## 2021-09-06 DIAGNOSIS — Z89512 Acquired absence of left leg below knee: Secondary | ICD-10-CM | POA: Diagnosis not present

## 2021-09-06 DIAGNOSIS — Z7982 Long term (current) use of aspirin: Secondary | ICD-10-CM | POA: Diagnosis not present

## 2021-09-06 DIAGNOSIS — E1169 Type 2 diabetes mellitus with other specified complication: Secondary | ICD-10-CM | POA: Diagnosis present

## 2021-09-06 DIAGNOSIS — Z794 Long term (current) use of insulin: Secondary | ICD-10-CM | POA: Diagnosis not present

## 2021-09-06 DIAGNOSIS — E1152 Type 2 diabetes mellitus with diabetic peripheral angiopathy with gangrene: Secondary | ICD-10-CM | POA: Diagnosis present

## 2021-09-06 DIAGNOSIS — E1165 Type 2 diabetes mellitus with hyperglycemia: Secondary | ICD-10-CM | POA: Diagnosis present

## 2021-09-06 DIAGNOSIS — E872 Acidosis, unspecified: Secondary | ICD-10-CM | POA: Diagnosis present

## 2021-09-06 DIAGNOSIS — E785 Hyperlipidemia, unspecified: Secondary | ICD-10-CM | POA: Diagnosis present

## 2021-09-06 DIAGNOSIS — I10 Essential (primary) hypertension: Secondary | ICD-10-CM | POA: Diagnosis present

## 2021-09-06 DIAGNOSIS — M86142 Other acute osteomyelitis, left hand: Secondary | ICD-10-CM | POA: Diagnosis present

## 2021-09-06 DIAGNOSIS — Z833 Family history of diabetes mellitus: Secondary | ICD-10-CM | POA: Diagnosis not present

## 2021-09-06 LAB — LACTIC ACID, PLASMA
Lactic Acid, Venous: 2.2 mmol/L (ref 0.5–1.9)
Lactic Acid, Venous: 2.1 mmol/L (ref 0.5–1.9)

## 2021-09-06 LAB — BASIC METABOLIC PANEL
Anion gap: 8 (ref 5–15)
BUN: 14 mg/dL (ref 8–23)
CO2: 24 mmol/L (ref 22–32)
Calcium: 8.9 mg/dL (ref 8.9–10.3)
Chloride: 98 mmol/L (ref 98–111)
Creatinine, Ser: 0.94 mg/dL (ref 0.61–1.24)
GFR, Estimated: >60 mL/min (ref >60)
Glucose, Bld: 461 mg/dL — ABNORMAL HIGH (ref 70–99)
Potassium: 4.3 mmol/L (ref 3.5–5.1)
Sodium: 130 mmol/L — ABNORMAL LOW (ref 135–145)

## 2021-09-06 LAB — CBC
HCT: 42.2 % (ref 39.0–52.0)
Hemoglobin: 13.8 g/dL (ref 13.0–17.0)
MCH: 28.5 pg (ref 26.0–34.0)
MCHC: 32.7 g/dL (ref 30.0–36.0)
MCV: 87 fL (ref 80.0–100.0)
Platelets: 248 10*3/uL (ref 150–400)
RBC: 4.85 MIL/uL (ref 4.22–5.81)
RDW: 13.7 % (ref 11.5–15.5)
WBC: 12.6 10*3/uL — ABNORMAL HIGH (ref 4.0–10.5)
nRBC: 0 % (ref 0.0–0.2)

## 2021-09-06 LAB — CBG MONITORING, ED: Glucose-Capillary: 460 mg/dL — ABNORMAL HIGH (ref 70–99)

## 2021-09-06 LAB — GLUCOSE, CAPILLARY
Glucose-Capillary: 367 mg/dL — ABNORMAL HIGH (ref 70–99)
Glucose-Capillary: 285 mg/dL — ABNORMAL HIGH (ref 70–99)

## 2021-09-06 LAB — HEMOGLOBIN A1C
Hgb A1c MFr Bld: 12.2 % — ABNORMAL HIGH (ref 4.8–5.6)
Mean Plasma Glucose: 303.44 mg/dL

## 2021-09-06 LAB — RESP PANEL BY RT-PCR (FLU A&B, COVID) ARPGX2
Influenza A by PCR: NEGATIVE
Influenza B by PCR: NEGATIVE
SARS Coronavirus 2 by RT PCR: NEGATIVE

## 2021-09-06 MED ORDER — OXYCODONE HCL 5 MG PO TABS
5.0000 mg | ORAL_TABLET | ORAL | Status: DC | PRN
  Administered 2021-09-06 – 2021-09-19 (×36): 5 mg via ORAL
  Filled 2021-09-06 (×37): qty 1

## 2021-09-06 MED ORDER — VANCOMYCIN HCL 2000 MG/400ML IV SOLN
2000.0000 mg | Freq: Once | INTRAVENOUS | Status: AC
  Administered 2021-09-06: 2000 mg via INTRAVENOUS
  Filled 2021-09-06: qty 400

## 2021-09-06 MED ORDER — INSULIN ASPART 100 UNIT/ML IJ SOLN
0.0000 [IU] | Freq: Three times a day (TID) | INTRAMUSCULAR | Status: DC
  Administered 2021-09-06: 20 [IU] via SUBCUTANEOUS
  Administered 2021-09-07: 15 [IU] via SUBCUTANEOUS
  Administered 2021-09-07: 7 [IU] via SUBCUTANEOUS
  Administered 2021-09-07: 11 [IU] via SUBCUTANEOUS
  Administered 2021-09-08 (×2): 15 [IU] via SUBCUTANEOUS
  Administered 2021-09-08: 20 [IU] via SUBCUTANEOUS
  Administered 2021-09-09: 13:00:00 15 [IU] via SUBCUTANEOUS
  Administered 2021-09-09 – 2021-09-10 (×3): 11 [IU] via SUBCUTANEOUS
  Administered 2021-09-10: 7 [IU] via SUBCUTANEOUS
  Administered 2021-09-10: 11 [IU] via SUBCUTANEOUS
  Administered 2021-09-11: 15 [IU] via SUBCUTANEOUS
  Administered 2021-09-11 (×2): 11 [IU] via SUBCUTANEOUS
  Administered 2021-09-12: 15 [IU] via SUBCUTANEOUS
  Administered 2021-09-12: 11 [IU] via SUBCUTANEOUS
  Administered 2021-09-12: 4 [IU] via SUBCUTANEOUS
  Administered 2021-09-13 (×2): 11 [IU] via SUBCUTANEOUS
  Administered 2021-09-13: 7 [IU] via SUBCUTANEOUS
  Administered 2021-09-14: 11 [IU] via SUBCUTANEOUS
  Administered 2021-09-15: 15 [IU] via SUBCUTANEOUS
  Administered 2021-09-15: 4 [IU] via SUBCUTANEOUS
  Administered 2021-09-15: 7 [IU] via SUBCUTANEOUS
  Administered 2021-09-16: 11 [IU] via SUBCUTANEOUS
  Administered 2021-09-16 (×2): 15 [IU] via SUBCUTANEOUS
  Administered 2021-09-17: 7 [IU] via SUBCUTANEOUS
  Administered 2021-09-17: 11 [IU] via SUBCUTANEOUS
  Administered 2021-09-17: 7 [IU] via SUBCUTANEOUS
  Administered 2021-09-18 – 2021-09-19 (×5): 4 [IU] via SUBCUTANEOUS
  Administered 2021-09-19: 18:00:00 3 [IU] via SUBCUTANEOUS
  Filled 2021-09-06: qty 0.2

## 2021-09-06 MED ORDER — NYSTATIN 100000 UNIT/GM EX POWD
Freq: Three times a day (TID) | CUTANEOUS | Status: DC
  Administered 2021-09-10 – 2021-09-12 (×3): 1 via TOPICAL
  Filled 2021-09-06 (×3): qty 15

## 2021-09-06 MED ORDER — ONDANSETRON HCL 4 MG PO TABS: 4.0000 mg | ORAL_TABLET | Freq: Four times a day (QID) | ORAL | Status: DC | PRN

## 2021-09-06 MED ORDER — SODIUM CHLORIDE 0.9 % IV SOLN: 2.0000 g | Freq: Once | INTRAVENOUS | Status: DC

## 2021-09-06 MED ORDER — APIXABAN 5 MG PO TABS
5.0000 mg | ORAL_TABLET | Freq: Two times a day (BID) | ORAL | Status: DC
  Administered 2021-09-06 – 2021-09-13 (×14): 5 mg via ORAL
  Filled 2021-09-06 (×14): qty 1

## 2021-09-06 MED ORDER — LOSARTAN POTASSIUM 50 MG PO TABS
25.0000 mg | ORAL_TABLET | Freq: Every day | ORAL | Status: DC
  Administered 2021-09-06 – 2021-09-19 (×12): 25 mg via ORAL
  Filled 2021-09-06 (×12): qty 1

## 2021-09-06 MED ORDER — INSULIN GLARGINE-YFGN 100 UNIT/ML ~~LOC~~ SOLN
30.0000 [IU] | Freq: Every day | SUBCUTANEOUS | Status: DC
  Administered 2021-09-06 – 2021-09-10 (×5): 30 [IU] via SUBCUTANEOUS
  Filled 2021-09-06 (×5): qty 0.3

## 2021-09-06 MED ORDER — ASPIRIN EC 81 MG PO TBEC
81.0000 mg | DELAYED_RELEASE_TABLET | Freq: Every day | ORAL | Status: DC
  Administered 2021-09-06 – 2021-09-19 (×12): 81 mg via ORAL
  Filled 2021-09-06 (×12): qty 1

## 2021-09-06 MED ORDER — ATORVASTATIN CALCIUM 40 MG PO TABS
80.0000 mg | ORAL_TABLET | Freq: Every day | ORAL | Status: DC
  Administered 2021-09-06 – 2021-09-19 (×13): 80 mg via ORAL
  Filled 2021-09-06 (×13): qty 2

## 2021-09-06 MED ORDER — SODIUM CHLORIDE 0.9 % IV SOLN
2.0000 g | Freq: Once | INTRAVENOUS | Status: AC
  Administered 2021-09-06: 2 g via INTRAVENOUS
  Filled 2021-09-06: qty 20

## 2021-09-06 MED ORDER — ACETAMINOPHEN 325 MG PO TABS
650.0000 mg | ORAL_TABLET | Freq: Four times a day (QID) | ORAL | Status: DC | PRN
  Administered 2021-09-07 – 2021-09-16 (×13): 650 mg via ORAL
  Filled 2021-09-06 (×14): qty 2

## 2021-09-06 MED ORDER — ACETAMINOPHEN 650 MG RE SUPP
650.0000 mg | Freq: Four times a day (QID) | RECTAL | Status: DC | PRN
  Administered 2021-09-17: 650 mg via RECTAL

## 2021-09-06 MED ORDER — LACTATED RINGERS IV BOLUS
1000.0000 mL | Freq: Once | INTRAVENOUS | Status: AC
  Administered 2021-09-06: 1000 mL via INTRAVENOUS

## 2021-09-06 MED ORDER — LIDOCAINE-EPINEPHRINE (PF) 2 %-1:200000 IJ SOLN
INTRAMUSCULAR | Status: AC
  Filled 2021-09-06: qty 20

## 2021-09-06 MED ORDER — SODIUM CHLORIDE 0.9 % IV SOLN
2.0000 g | INTRAVENOUS | Status: DC
  Administered 2021-09-07 – 2021-09-09 (×3): 2 g via INTRAVENOUS
  Filled 2021-09-06 (×3): qty 20

## 2021-09-06 MED ORDER — VANCOMYCIN HCL 1750 MG/350ML IV SOLN
1750.0000 mg | Freq: Two times a day (BID) | INTRAVENOUS | Status: DC
  Administered 2021-09-07 (×3): 1750 mg via INTRAVENOUS
  Filled 2021-09-06 (×5): qty 350

## 2021-09-06 MED ORDER — ONDANSETRON HCL 4 MG/2ML IJ SOLN: 4.0000 mg | Freq: Four times a day (QID) | INTRAMUSCULAR | Status: DC | PRN

## 2021-09-06 MED ORDER — LACTATED RINGERS IV SOLN: INTRAVENOUS | Status: DC

## 2021-09-06 MED ORDER — INSULIN ASPART 100 UNIT/ML IJ SOLN
0.0000 [IU] | Freq: Three times a day (TID) | INTRAMUSCULAR | Status: DC
  Administered 2021-09-06: 20 [IU] via SUBCUTANEOUS
  Filled 2021-09-06: qty 0.15

## 2021-09-06 NOTE — Consult Note (Signed)
Reason for Consult/CC: Left long finger infection  Austin [name] is an 65 y.o. male.  HPI: Patient presents with left long finger pain and swelling.  Its been going on for for 5 days.  It is gradually getting worse.  His blood sugars have been out of control.  He has a history of left below-knee amputation for gangrene.  Emergency room called me to evaluate his finger.  Allergies: No Known Allergies  Medications:  Current Facility-Administered Medications:    acetaminophen (TYLENOL) tablet 650 mg, 650 mg, Oral, Q6H PRN **OR** acetaminophen (TYLENOL) suppository 650 mg, 650 mg, Rectal, Q6H PRN, Reubin Milan, MD   insulin aspart (novoLOG) injection 0-15 Units, 0-15 Units, Subcutaneous, TID WC, Reubin Milan, MD   lactated ringers infusion, , Intravenous, Continuous, Reubin Milan, MD   ondansetron [facility]) tablet 4 mg, 4 mg, Oral, Q6H PRN **OR** ondansetron (ZOFRAN) injection 4 mg, 4 mg, Intravenous, Q6H PRN, Reubin Milan, MD   oxyCODONE (Oxy IR/ROXICODONE) immediate release tablet 5 mg, 5 mg, Oral, Q4H PRN, Reubin Milan, MD   vancomycin [facility]) IVPB 1750 mg/350 mL, 1,750 mg, Intravenous, Q12H, Angela Adam, RPH   vancomycin (VANCOREADY) IVPB 2000 mg/400 mL, 2,000 mg, Intravenous, Once, Angela Adam, [facility], Last Rate: 200 mL/hr at 09/06/21 0920, 2,000 mg at 09/06/21 6834  Current Outpatient Medications:    ACCU-CHEK GUIDE test strip, , Disp: , Rfl:    apixaban (ELIQUIS) 5 MG TABS tablet, Take 1 tablet (5 mg total) by mouth 2 (two) times daily., Disp: 60 tablet, Rfl: O   aspirin EC 81 MG tablet, Take 1 tablet (81 mg total) by mouth daily., Disp: 150 tablet, Rfl: 2   atorvastatin (LIPITOR) 80 MG tablet, Take 1 tablet (80 mg total) by mouth daily at 6 PM., Disp: 30 tablet, Rfl: 0   Blood Glucose Monitoring Suppl (ACCU-CHEK GUIDE) w/Device KIT, USE 1 KIT AS DIRECTED FOUR TIMES A DAY, Disp: , Rfl:    Docusate Sodium (DSS) 100 MG CAPS, Take 100 mg by  mouth in the morning and at bedtime., Disp: , Rfl:    HUMALOG KWIKPEN 100 UNIT/ML KwikPen, Inject 24 Units into the skin 3 (three) times daily before meals., Disp: , Rfl:    insulin aspart (NOVOLOG) 100 UNIT/ML injection, Inject 10 Units into the skin 3 (three) times daily with meals., Disp: 10 mL, Rfl: 0   insulin glargine (LANTUS) 100 UNIT/ML injection, Inject 0.3 mLs (30 Units total) into the skin at bedtime., Disp: 10 mL, Rfl: 11   insulin glargine, 1 Unit Dial, (TOUJEO SOLOSTAR) 300 UNIT/ML Solostar Pen, Inject 24 Units into the skin at bedtime., Disp: , Rfl:    losartan (COZAAR) 25 MG tablet, Take 25 mg by mouth daily., Disp: , Rfl:    polyethylene glycol (MIRALAX) packet, Take 17 g by mouth daily., Disp: 14 each, Rfl: 0   TRUEPLUS 5-BEVEL PEN NEEDLES 31G X 6 MM MISC, , Disp: , Rfl:   Past Medical History:  Diagnosis Date   Coronary artery disease    Diabetes ([place])    DVT (deep venous thrombosis) (HCC)    GERD (gastroesophageal reflux disease)    Hyperlipidemia    Hypertension    Peripheral vascular disease ([place])     Past Surgical History:  Procedure Laterality Date   AMPUTATION Left 10/28/2018   Procedure: AMPUTATION BELOW KNEE;  Surgeon: Algernon Huxley, MD;  Location: [facility];  Service: Vascular;  Laterality: Left;   AMPUTATION TOE Left 02/10/2018  Procedure: AMPUTATION TOE;  Surgeon: Sharlotte Alamo, DPM;  Location: [facility];  Service: Podiatry;  Laterality: Left;   APPLICATION OF WOUND VAC Left 10/16/2018   Procedure: APPLICATION OF WOUND VAC;  Surgeon: Samara Deist, DPM;  Location: [facility];  Service: Podiatry;  Laterality: Left;   DORSAL SLIT N/A 01/12/2021   Procedure: DORSAL SLIT;  Surgeon: Billey Co, MD;  Location: [facility];  Service: Urology;  Laterality: N/A;   groin surgery     IRRIGATION AND DEBRIDEMENT FOOT Left 09/30/2018   Procedure: IRRIGATION AND DEBRIDEMENT FOOT;  Surgeon: Sharlotte Alamo, DPM;  Location: [facility];  Service: Podiatry;  Laterality: Left;   LOWER  EXTREMITY ANGIOGRAPHY Left 02/12/2018   Procedure: Lower Extremity Angiography;  Surgeon: Algernon Huxley, MD;  Location: [place] [facility];  Service: Cardiovascular;  Laterality: Left;   LOWER EXTREMITY ANGIOGRAPHY Left 10/01/2018   Procedure: Lower Extremity Angiography;  Surgeon: Algernon Huxley, MD;  Location: [place] [facility];  Service: Cardiovascular;  Laterality: Left;   LOWER EXTREMITY ANGIOGRAPHY Left 10/19/2018   Procedure: Lower Extremity Angiography;  Surgeon: Algernon Huxley, MD;  Location: [place] [facility];  Service: Cardiovascular;  Laterality: Left;   LOWER EXTREMITY ANGIOGRAPHY Left 10/20/2018   Procedure: LOWER EXTREMITY ANGIOGRAPHY;  Surgeon: Algernon Huxley, MD;  Location: [place] [facility];  Service: Cardiovascular;  Laterality: Left;   LOWER EXTREMITY ANGIOGRAPHY Right 11/25/2018   Procedure: LOWER EXTREMITY ANGIOGRAPHY;  Surgeon: Algernon Huxley, MD;  Location: [place] [facility];  Service: Cardiovascular;  Laterality: Right;   LOWER EXTREMITY ANGIOGRAPHY Right 01/10/2020   Procedure: LOWER EXTREMITY ANGIOGRAPHY;  Surgeon: Algernon Huxley, MD;  Location: [place] [facility];  Service: Cardiovascular;  Laterality: Right;   LOWER EXTREMITY ANGIOGRAPHY Right 12/11/2020   Procedure: LOWER EXTREMITY ANGIOGRAPHY;  Surgeon: Algernon Huxley, MD;  Location: [place] [facility];  Service: Cardiovascular;  Laterality: Right;   LOWER EXTREMITY INTERVENTION  02/12/2018   Procedure: LOWER EXTREMITY INTERVENTION;  Surgeon: Algernon Huxley, MD;  Location: [place] [facility];  Service: Cardiovascular;;   NECK SURGERY     TRANSMETATARSAL AMPUTATION Left 10/16/2018   Procedure: TRANSMETATARSAL AMPUTATION LEFT FOOT;  Surgeon: Samara Deist, DPM;  Location: [facility];  Service: Podiatry;  Laterality: Left;    Family History  Problem Relation Age of Onset   Diabetes Mother    Coronary artery disease Father    Hypertension Sister    Leukemia Brother     Social History:  reports that he quit smoking  about 8 months ago. His smoking use included cigarettes. He has a 47.00 pack-year smoking history. He has never used smokeless tobacco. He reports that he does not drink alcohol and does not use drugs.  Physical Exam Blood pressure (!) 169/86, pulse 79, temperature 97.9 F (36.6 C), temperature source Oral, resp. rate (!) 23, height _0  (1.93 m), weight 110.7 kg, SpO2 96 %. General: No acute distress.  Alert and oriented Left hand: Fingers well-perfused normal cap refill palp radial pulse.  Sensation seems to be intact throughout.  He has reasonable range of motion.  Range of motion of the long finger is limited due to pain and swelling.  He is nontender over the A1 and A2 pulleys of the long finger.  Minimal pain on passive stretch.  He looks to have a fluid collection in the distal tip extending along the ulnar side of the finger back to approximately the level of the PIP joint.  This area is where its the most tender.  Mild swelling and erythema extending onto the dorsum of his hand.  No fluctuance in the dorsal aspect of the hand or anywhere except for the fingertip  Results for orders placed or performed during the hospital encounter of 09/05/21 (from the past 48 hour(s))  CBC     Status: Abnormal   Collection Time: 09/06/21  6:00 AM  Result Value Ref Range   WBC 12.6 (H) 4.0 - 10.5 K/uL   RBC 4.85 4.22 - 5.81 MIL/uL   Hemoglobin 13.8 13.0 - 17.0 g/dL   HCT 42.2 39.0 - 52.0 %   MCV 87.0 80.0 - 100.0 fL   MCH 28.5 26.0 - 34.0 pg   MCHC 32.7 30.0 - 36.0 g/dL   RDW 13.7 11.5 - 15.5 %   Platelets 248 150 - 400 K/uL   nRBC 0.0 0.0 - 0.2 %    Comment: Performed at [facility], [place] [address], [place], [place] [address]  Basic metabolic panel     Status: Abnormal   Collection Time: 09/06/21  6:00 AM  Result Value Ref Range   Sodium 130 (L) 135 - 145 mmol/L   Potassium 4.3 3.5 - 5.1 mmol/L   Chloride 98 98 - 111 mmol/L   CO2 24 22 - 32 mmol/L   Glucose, Bld 461 (H) 70  - 99 mg/dL    Comment: Glucose reference range applies only to samples taken after fasting for at least 8 hours.   BUN 14 8 - 23 mg/dL   Creatinine, Ser 0.94 0.61 - 1.24 mg/dL   Calcium 8.9 8.9 - 10.3 mg/dL   GFR, Estimated >60 >60 mL/min    Comment: (NOTE) Calculated using the CKD-EPI Creatinine Equation (2021)    Anion gap 8 5 - 15    Comment: Performed at [facility], [place] [address], [place], [place] [address]  Resp Panel by RT-PCR (Flu A&B, Covid) Nasopharyngeal Swab     Status: None   Collection Time: 09/06/21  6:16 AM   Specimen: Nasopharyngeal Swab; Nasopharyngeal(NP) swabs in vial transport medium  Result Value Ref Range   SARS Coronavirus 2 by RT PCR NEGATIVE NEGATIVE    Comment: (NOTE) SARS-CoV-2 target nucleic acids are NOT DETECTED.  The SARS-CoV-2 RNA is generally detectable in upper respiratory specimens during the acute phase of infection. The lowest concentration of SARS-CoV-2 viral copies this assay can detect is 138 copies/mL. A negative result does not preclude SARS-Cov-2 infection and should not be used as the sole basis for treatment or other patient management decisions. A negative result may occur with  improper specimen collection/handling, submission of specimen other than nasopharyngeal swab, presence of viral mutation(s) within the areas targeted by this assay, and inadequate number of viral copies(<138 copies/mL). A negative result must be combined with clinical observations, patient history, and epidemiological information. The expected result is Negative.  Fact Sheet for Patients:  EntrepreneurPulse.com.au  Fact Sheet for Healthcare Providers:  IncredibleEmployment.be  This test is no t yet approved or cleared by the [place] FDA and  has been authorized for detection and/or diagnosis of SARS-CoV-2 by FDA under an Emergency Use Authorization (EUA). This EUA will remain  in effect  (meaning this test can be used) for the duration of the COVID-19 declaration under Section 564(b)(1) of the Act, 21 U.S.C.section 360bbb-3(b)(1), unless the authorization is terminated  or revoked sooner.       Influenza A by PCR NEGATIVE NEGATIVE  Influenza B by PCR NEGATIVE NEGATIVE    Comment: (NOTE) The Xpert Xpress SARS-CoV-2/FLU/RSV plus assay is intended as an aid in the diagnosis of influenza from Nasopharyngeal swab specimens and should not be used as a sole basis for treatment. Nasal washings and aspirates are unacceptable for Xpert Xpress SARS-CoV-2/FLU/RSV testing.  Fact Sheet for Patients: EntrepreneurPulse.com.au  Fact Sheet for Healthcare Providers: IncredibleEmployment.be  This test is not yet approved or cleared by the [place] FDA and has been authorized for detection and/or diagnosis of SARS-CoV-2 by FDA under an Emergency Use Authorization (EUA). This EUA will remain in effect (meaning this test can be used) for the duration of the COVID-19 declaration under Section 564(b)(1) of the Act, 21 U.S.C. section 360bbb-3(b)(1), unless the authorization is terminated or revoked.  Performed at [facility], [place] [address] [place], [place] [address]   Lactic acid, plasma     Status: Abnormal   Collection Time: 09/06/21  6:16 AM  Result Value Ref Range   Lactic Acid, Venous 2.2 (HH) 0.5 - 1.9 mmol/L    Comment: CRITICAL RESULT CALLED TO, READ BACK BY AND VERIFIED WITH: BLAIR, I. RN ON 09/06/2021 @ 0735 BY MECIAL J. Performed at [facility], [place] [address], [place], [place] [address]     DG Wrist Complete Left  Result Date: 09/05/2021 CLINICAL DATA:  Left upper extremity pain. EXAM: LEFT HAND - COMPLETE 3+ VIEW; LEFT WRIST - COMPLETE 3+ VIEW COMPARISON:  None. FINDINGS: No acute fracture or dislocation. Mild osteopenia. No significant arthritic changes. The soft tissue swelling of  the dorsum of the hand. No radiopaque foreign object or soft tissue gas. IMPRESSION: 1. No acute fracture or dislocation. 2. Soft tissue swelling of the hand. Electronically Signed   By: Anner Crete M.D.   On: 09/05/2021 19:48   DG Hand Complete Left  Result Date: 09/05/2021 CLINICAL DATA:  Left upper extremity pain. EXAM: LEFT HAND - COMPLETE 3+ VIEW; LEFT WRIST - COMPLETE 3+ VIEW COMPARISON:  None. FINDINGS: No acute fracture or dislocation. Mild osteopenia. No significant arthritic changes. The soft tissue swelling of the dorsum of the hand. No radiopaque foreign object or soft tissue gas. IMPRESSION: 1. No acute fracture or dislocation. 2. Soft tissue swelling of the hand. Electronically Signed   By: Anner Crete M.D.   On: 09/05/2021 19:48    Assessment/Plan: Patient presents with left long finger infection.  This looks to be similar to a felon.  I recommended bedside I&D.  We discussed the risks and benefits and is interested in moving forward.  Preoperative diagnosis: Left long finger infection Postoperative diagnosis: Same  Procedure performed: Finger was anesthetized with lidocaine with epinephrine for digital block.  This was given time to work.  The long finger was prepped with Betadine.  Incision was made over the area of the fingertip distally where fluid collection could be visualized.  This was cultured.  There look to be purulence and fluid in a pocket between the dermis and epidermis.  The blistering epidermis was removed and this encompassed the ulnar aspect of the fingertip extending back towards the PIP joint.  All fluid was evacuated.  There was some extension of this blistering to the dorsal aspect of the fingertip and the eponychial fold.  This was debrided as well.  I did make an incision through the dermis on the ulnar aspect of the fingertip and explored the subcutaneous tissues of the pulp.  No fluid was identified in this  area.  I did irrigate the entire wound  copiously with saline.  Iodoform gauze was packed in the subcutaneous pocket and the pulp and it was wrapped with a soft dressing.  Recommend inpatient admission for blood sugar control as his blood sugar was over 400 in the emergency room.  Recommend broad-spectrum antibiotic coverage which can subsequently be focused depending on the culture results.  We will plan to follow along to ensure that he is heading in the right direction.  Regarding wound care recommend twice a day dressing changes.  For this would remove the current dressing and packing and soak the hand in solution of saline combined with Betadine.  Would then repack the fingertip wound with a small wick of gauze and rewrap.  Recommend keeping the hand elevated.  Call with any questions.  Cindra Presume 09/06/2021, 9:49 AM

## 2021-09-06 NOTE — ED Provider Notes (Signed)
[facility] DEPT Provider Note   CSN: [id number] Arrival date & time: 09/05/21  0254     History Chief Complaint  Patient presents with   Arm Swelling    Austin [name] is a 65 y.o. male.  HPI 65 year old male with a history of CAD, DM type II, DVT on Eliquis, left BKA, history of gangrenous foot ulcers presents to the ER with complaints of left middle finger swelling which has been ongoing for the last 5 to 6 days.  No known fevers or chills.  He was seen at urgent care earlier and was sent to the ER for further evaluation.  Has pain with movement of the middle finger.  Wife at bedside stated that she noticed an ulcer developed the tip of his finger which he accidentally cut today and it did produce some pus    Past Medical History:  Diagnosis Date   Coronary artery disease    Diabetes ([place])    DVT (deep venous thrombosis) (HCC)    GERD (gastroesophageal reflux disease)    Hyperlipidemia    Hypertension    Peripheral vascular disease ([place])     Patient Active Problem List   Diagnosis Date Noted   Infection of left hand 09/06/2021   Lower limb ulcer, calf ([place]) 07/13/2021   Acute osteomyelitis of right foot ([place]) 03/30/2021   Diabetes mellitus type 2, uncomplicated ([place]) 27/03/2375   Atherosclerosis of native arteries of the extremities with ulceration ([place]) 01/01/2019   Status post below-knee amputation ([place]) 11/30/2018   Type 2 diabetes mellitus with diabetic peripheral angiopathy without gangrene ([place]) 10/14/2018   Malnutrition of moderate degree 10/02/2018   Foot ulcer ([place]) 09/29/2018   Gangrene of left foot ([place]) 02/09/2018   Atherosclerosis of native arteries of extremity with intermittent claudication ([place]) 05/25/2017   Diabetes ([place]) 05/25/2017   Essential hypertension 05/25/2017   Hyperlipidemia 05/25/2017    Past Surgical History:  Procedure Laterality Date   AMPUTATION Left 10/28/2018   Procedure: AMPUTATION BELOW KNEE;   Surgeon: Algernon Huxley, MD;  Location: [facility];  Service: Vascular;  Laterality: Left;   AMPUTATION TOE Left 02/10/2018   Procedure: AMPUTATION TOE;  Surgeon: Sharlotte Alamo, DPM;  Location: [facility];  Service: Podiatry;  Laterality: Left;   APPLICATION OF WOUND VAC Left 10/16/2018   Procedure: APPLICATION OF WOUND VAC;  Surgeon: Samara Deist, DPM;  Location: [facility];  Service: Podiatry;  Laterality: Left;   DORSAL SLIT N/A 01/12/2021   Procedure: DORSAL SLIT;  Surgeon: Billey Co, MD;  Location: [facility];  Service: Urology;  Laterality: N/A;   groin surgery     IRRIGATION AND DEBRIDEMENT FOOT Left 09/30/2018   Procedure: IRRIGATION AND DEBRIDEMENT FOOT;  Surgeon: Sharlotte Alamo, DPM;  Location: [facility];  Service: Podiatry;  Laterality: Left;   LOWER EXTREMITY ANGIOGRAPHY Left 02/12/2018   Procedure: Lower Extremity Angiography;  Surgeon: Algernon Huxley, MD;  Location: [place] [facility];  Service: Cardiovascular;  Laterality: Left;   LOWER EXTREMITY ANGIOGRAPHY Left 10/01/2018   Procedure: Lower Extremity Angiography;  Surgeon: Algernon Huxley, MD;  Location: [place] [facility];  Service: Cardiovascular;  Laterality: Left;   LOWER EXTREMITY ANGIOGRAPHY Left 10/19/2018   Procedure: Lower Extremity Angiography;  Surgeon: Algernon Huxley, MD;  Location: [place] [facility];  Service: Cardiovascular;  Laterality: Left;   LOWER EXTREMITY ANGIOGRAPHY Left 10/20/2018   Procedure: LOWER EXTREMITY ANGIOGRAPHY;  Surgeon: Algernon Huxley, MD;  Location: [place] [facility];  Service: Cardiovascular;  Laterality: Left;   LOWER EXTREMITY ANGIOGRAPHY Right 11/25/2018   Procedure: LOWER EXTREMITY ANGIOGRAPHY;  Surgeon: Algernon Huxley, MD;  Location: [place] [facility];  Service: Cardiovascular;  Laterality: Right;   LOWER EXTREMITY ANGIOGRAPHY Right 01/10/2020   Procedure: LOWER EXTREMITY ANGIOGRAPHY;  Surgeon: Algernon Huxley, MD;  Location: [place] [facility];  Service: Cardiovascular;  Laterality: Right;   LOWER  EXTREMITY ANGIOGRAPHY Right 12/11/2020   Procedure: LOWER EXTREMITY ANGIOGRAPHY;  Surgeon: Algernon Huxley, MD;  Location: [place] [facility];  Service: Cardiovascular;  Laterality: Right;   LOWER EXTREMITY INTERVENTION  02/12/2018   Procedure: LOWER EXTREMITY INTERVENTION;  Surgeon: Algernon Huxley, MD;  Location: [place] [facility];  Service: Cardiovascular;;   NECK SURGERY     TRANSMETATARSAL AMPUTATION Left 10/16/2018   Procedure: TRANSMETATARSAL AMPUTATION LEFT FOOT;  Surgeon: Samara Deist, DPM;  Location: [facility];  Service: Podiatry;  Laterality: Left;       Family History  Problem Relation Age of Onset   Diabetes Mother    Coronary artery disease Father    Hypertension Sister    Leukemia Brother     Social History   Tobacco Use   Smoking status: Former    Packs/day: 1.00    Years: 47.00    Pack years: 47.00    Types: Cigarettes    Quit date: 01/2021    Years since quitting: 0.6   Smokeless tobacco: Never  Vaping Use   Vaping Use: Never used  Substance Use Topics   Alcohol use: No   Drug use: No    Home Medications Prior to Admission medications   Medication Sig Start Date End Date Taking? Authorizing Provider  ACCU-CHEK GUIDE test strip  06/26/20   [provider]  apixaban (ELIQUIS) 5 MG TABS tablet Take 1 tablet (5 mg total) by mouth 2 (two) times daily. 02/12/18   Nicholes Mango, MD  aspirin EC 81 MG tablet Take 1 tablet (81 mg total) by mouth daily. 01/10/20   Algernon Huxley, MD  atorvastatin (LIPITOR) 80 MG tablet Take 1 tablet (80 mg total) by mouth daily at 6 PM. 02/12/18   Gouru, Aruna, MD  Blood Glucose Monitoring Suppl (ACCU-CHEK GUIDE) w/Device KIT USE 1 KIT AS DIRECTED FOUR TIMES A DAY 10/27/20   [provider]  Docusate Sodium (DSS) 100 MG CAPS Take 100 mg by mouth in the morning and at bedtime.    [provider]  HUMALOG KWIKPEN 100 UNIT/ML KwikPen Inject 24 Units into the skin 3 (three) times daily before meals. 12/08/19   [provider]  insulin aspart (NOVOLOG) 100 UNIT/ML injection Inject 10 Units into the skin 3 (three) times daily with meals. 10/30/18   Bettey Costa, MD  insulin glargine (LANTUS) 100 UNIT/ML injection Inject 0.3 mLs (30 Units total) into the skin at bedtime. 10/30/18   Bettey Costa, MD  insulin glargine, 1 Unit Dial, (TOUJEO SOLOSTAR) 300 UNIT/ML Solostar Pen Inject 24 Units into the skin at bedtime.    [provider]  losartan (COZAAR) 25 MG tablet Take 25 mg by mouth daily. 11/13/20   [provider]  polyethylene glycol (MIRALAX) packet Take 17 g by mouth daily. 10/04/18   Hillary Bow, MD  TRUEPLUS 5-BEVEL PEN NEEDLES 31G X 6 MM MISC  10/25/20   [provider]    Allergies    Patient has no known allergies.  Review of Systems   Review of Systems Ten systems reviewed  and are negative for acute change, except as noted in the HPI.   Physical Exam Updated Vital Signs BP (!) 168/96 (BP Location: Right Arm)   Pulse 72   Temp 98 F (36.7 C) (Oral)   Resp 15   Wt 111.1 kg   SpO2 96%   BMI 29.81 kg/m   Physical Exam Vitals and nursing note reviewed.  Constitutional:      General: He is not in acute distress.    Appearance: He is well-developed.  HENT:     Head: Normocephalic and atraumatic.  Eyes:     Conjunctiva/sclera: Conjunctivae normal.  Cardiovascular:     Rate and Rhythm: Normal rate and regular rhythm.     Heart sounds: No murmur heard. Pulmonary:     Effort: Pulmonary effort is normal. No respiratory distress.     Breath sounds: Normal breath sounds.  Abdominal:     Palpations: Abdomen is soft.     Tenderness: There is no abdominal tenderness.  Musculoskeletal:        General: Swelling and tenderness present.     Cervical back: Neck supple.     Comments: Left index finger with significant swelling, held passively in flexion.  Significant pain with extension of the left middle finger and tenderness to the flexor tendon sheath.  Fingers  warm to the touch, evidence of ulceration to the volar aspect of the tip of the left middle finger.  2+ radial pulses.  Evidence of left BKA  Skin:    General: Skin is warm and dry.     Capillary Refill: Capillary refill takes less than 2 seconds.  Neurological:     Mental Status: He is alert.  Psychiatric:        Mood and Affect: Mood normal.       ED Results / Procedures / Treatments   Labs (all labs ordered are listed, but only abnormal results are displayed) Labs Reviewed  CBC - Abnormal; Notable for the following components:      Result Value   WBC 12.6 (*)    All other components within normal limits  BASIC METABOLIC PANEL - Abnormal; Notable for the following components:   Sodium 130 (*)    Glucose, Bld 461 (*)    All other components within normal limits  RESP PANEL BY RT-PCR (FLU A&B, COVID) ARPGX2  LACTIC ACID, PLASMA  LACTIC ACID, PLASMA    EKG None  Radiology DG Wrist Complete Left  Result Date: 09/05/2021 CLINICAL DATA:  Left upper extremity pain. EXAM: LEFT HAND - COMPLETE 3+ VIEW; LEFT WRIST - COMPLETE 3+ VIEW COMPARISON:  None. FINDINGS: No acute fracture or dislocation. Mild osteopenia. No significant arthritic changes. The soft tissue swelling of the dorsum of the hand. No radiopaque foreign object or soft tissue gas. IMPRESSION: 1. No acute fracture or dislocation. 2. Soft tissue swelling of the hand. Electronically Signed   By: Anner Crete M.D.   On: 09/05/2021 19:48   DG Hand Complete Left  Result Date: 09/05/2021 CLINICAL DATA:  Left upper extremity pain. EXAM: LEFT HAND - COMPLETE 3+ VIEW; LEFT WRIST - COMPLETE 3+ VIEW COMPARISON:  None. FINDINGS: No acute fracture or dislocation. Mild osteopenia. No significant arthritic changes. The soft tissue swelling of the dorsum of the hand. No radiopaque foreign object or soft tissue gas. IMPRESSION: 1. No acute fracture or dislocation. 2. Soft tissue swelling of the hand. Electronically Signed   By: Anner Crete M.D.   On: 09/05/2021 19:48  Procedures Procedures   Medications Ordered in ED Medications  cefTRIAXone (ROCEPHIN) 2 g in sodium chloride 0.9 % 100 mL IVPB (has no administration in time range)  vancomycin (VANCOREADY) IVPB 2000 mg/400 mL (has no administration in time range)  vancomycin (VANCOREADY) IVPB 1750 mg/350 mL (has no administration in time range)    ED Course  I have reviewed the triage vital signs and the nursing notes.  Pertinent labs & imaging results that were available during my care of the patient were reviewed by me and considered in my medical decision making (see chart for details).     MDM Rules/Calculators/A&P                          65 year old male with left middle finger swelling.  Extensive history of diabetic ulcers, s/p left BKA.  History of gangrene.  Left index finger looks acutely infected with sausagelike appearance, held in flexion, with tenderness with extension and palpation of the flexor tendon.  X-rays without evidence of osteomyelitis, however there is some concern for possible flexor tenosynovitis.  Plan for hand surgery consultation, suspect patient will need admission.  Spoke with Dr. Claudia Desanctis with hand surgery.  They will evaluate the patient and patient, plan for admission.  Initiated Rocephin and Vanc.  Consulted hospitalist for admission.  Spoke with Dr. Myna Hidalgo who will admit the patient for further evaluation and treatment.  Able for admission.  Final Clinical Impression(s) / ED Diagnoses Final diagnoses:  Cellulitis of left index finger    Rx / DC Orders ED Discharge Orders     None        Garald Balding, PA-C 09/06/21 [id number]    Orpah Greek, MD 09/06/21 [phone]

## 2021-09-06 NOTE — Progress Notes (Signed)
Inpatient Diabetes Program Recommendations  AACE/ADA: New Consensus Statement on Inpatient Glycemic Control (2015)  Target Ranges:  Prepandial:   less than 140 mg/dL      Peak postprandial:   less than 180 mg/dL (1-2 hours)      Critically ill patients:  140 - 180 mg/dL   Lab Results  Component Value Date   GLUCAP 125 (H) 04/02/2021   HGBA1C 12.2 (H) 09/06/2021    Review of Glycemic Control  Latest Reference Range & Units 09/06/21 06:00  Glucose 70 - 99 mg/dL 546 (H)   Diabetes history: DM 2 Outpatient Diabetes medications: Humalog 20 units tid, Lantus 30 units qhs Current orders for Inpatient glycemic control:  Novolog 0-15 units tid   A1c 12.2% on 12/1  Inpatient Diabetes Program Recommendations:    -  Start Semglee 30 units -  Add Novolog hs scale   Spoke with pt at bedside regarding A1c and glucose control at home. Discussed glucose and A1c goals.Pt sees his PCP for DM Management in [place] but has not seen him in >6 months. However, pt did report seeing him usually every 3-4 months for DM follow up. Pt reports at last PCP appt his A1c was lower, but said his PCP did increase both Humalog and Lantus. Pt said at baseline his glucose is 150 range, but it has been elevated the last few weeks. Discussed importance of glucose control on wound healing during this time.   Thanks,  Christena Deem RN, MSN, BC-ADM Inpatient Diabetes Coordinator Team Pager [phone] (8a-5p)

## 2021-09-06 NOTE — Consult Note (Signed)
WOC consulted for finger cellulitis Plastic surgery has seen this patient for same and written wound care orders.  Requested image of RLE, if wound present Will follow up in am on RLE wound care needs  Austin [name] [facility], CNS, CWON-AP [phone]

## 2021-09-06 NOTE — Progress Notes (Signed)
Pharmacy Antibiotic Note  Austin [name] is a 65 y.o. male admitted on 09/05/2021 Pt sent to the ED from urgent care due to left finger swelling and erythema.  Reports that this has been going on for 2 days.  He has a history of osteomyelitis requiring left lower extremity amputation.Marland Kitchen  Pharmacy has been consulted to dose vancomycin for flexor tennosynovitis/finger infection  Plan: Vancomycin 2gm IV x 1 then 1750mg  IV q12h (AUC 526.1, Scr 0.94) Follow renal function, cultures and clinical course  Weight: 111.1 kg (244 lb 14.9 oz)  Temp (24hrs), Avg:98.4 F (36.9 C), Min:98 F (36.7 C), Max:99 F (37.2 C)  Recent Labs  Lab 09/06/21 0600  WBC 12.6*  CREATININE 0.94    Estimated Creatinine Clearance: 105.5 mL/min (by C-G formula based on SCr of 0.94 mg/dL).    No Known Allergies  Antimicrobials this admission: 12/1 vanc >> 12/1 CTX x 1  Dose adjustments this admission:   Microbiology results:    Thank you for allowing pharmacy to be a part of this patient's care.  14/1 RPh 09/06/2021, 6:34 AM

## 2021-09-06 NOTE — H&P (View-Only) (Signed)
Reason for Consult/CC: Left long finger infection  Austin [name] is an 65 y.o. male.  HPI: Patient presents with left long finger pain and swelling.  Its been going on for for 5 days.  It is gradually getting worse.  His blood sugars have been out of control.  He has a history of left below-knee amputation for gangrene.  Emergency room called me to evaluate his finger.  Allergies: No Known Allergies  Medications:  Current Facility-Administered Medications:    acetaminophen (TYLENOL) tablet 650 mg, 650 mg, Oral, Q6H PRN **OR** acetaminophen (TYLENOL) suppository 650 mg, 650 mg, Rectal, Q6H PRN, Ortiz, David Manuel, MD   insulin aspart (novoLOG) injection 0-15 Units, 0-15 Units, Subcutaneous, TID WC, Ortiz, David Manuel, MD   lactated ringers infusion, , Intravenous, Continuous, Ortiz, David Manuel, MD   ondansetron (ZOFRAN) tablet 4 mg, 4 mg, Oral, Q6H PRN **OR** ondansetron (ZOFRAN) injection 4 mg, 4 mg, Intravenous, Q6H PRN, Ortiz, David Manuel, MD   oxyCODONE (Oxy IR/ROXICODONE) immediate release tablet 5 mg, 5 mg, Oral, Q4H PRN, Ortiz, David Manuel, MD   vancomycin (VANCOREADY) IVPB 1750 mg/350 mL, 1,750 mg, Intravenous, Q12H, Jackson, Rachel E, RPH   vancomycin (VANCOREADY) IVPB 2000 mg/400 mL, 2,000 mg, Intravenous, Once, Jackson, Rachel E, RPH, Last Rate: 200 mL/hr at 09/06/21 0920, 2,000 mg at 09/06/21 0920  Current Outpatient Medications:    ACCU-CHEK GUIDE test strip, , Disp: , Rfl:    apixaban (ELIQUIS) 5 MG TABS tablet, Take 1 tablet (5 mg total) by mouth 2 (two) times daily., Disp: 60 tablet, Rfl: O   aspirin EC 81 MG tablet, Take 1 tablet (81 mg total) by mouth daily., Disp: 150 tablet, Rfl: 2   atorvastatin (LIPITOR) 80 MG tablet, Take 1 tablet (80 mg total) by mouth daily at 6 PM., Disp: 30 tablet, Rfl: 0   Blood Glucose Monitoring Suppl (ACCU-CHEK GUIDE) w/Device KIT, USE 1 KIT AS DIRECTED FOUR TIMES A DAY, Disp: , Rfl:    Docusate Sodium (DSS) 100 MG CAPS, Take 100 mg by  mouth in the morning and at bedtime., Disp: , Rfl:    HUMALOG KWIKPEN 100 UNIT/ML KwikPen, Inject 24 Units into the skin 3 (three) times daily before meals., Disp: , Rfl:    insulin aspart (NOVOLOG) 100 UNIT/ML injection, Inject 10 Units into the skin 3 (three) times daily with meals., Disp: 10 mL, Rfl: 0   insulin glargine (LANTUS) 100 UNIT/ML injection, Inject 0.3 mLs (30 Units total) into the skin at bedtime., Disp: 10 mL, Rfl: 11   insulin glargine, 1 Unit Dial, (TOUJEO SOLOSTAR) 300 UNIT/ML Solostar Pen, Inject 24 Units into the skin at bedtime., Disp: , Rfl:    losartan (COZAAR) 25 MG tablet, Take 25 mg by mouth daily., Disp: , Rfl:    polyethylene glycol (MIRALAX) packet, Take 17 g by mouth daily., Disp: 14 each, Rfl: 0   TRUEPLUS 5-BEVEL PEN NEEDLES 31G X 6 MM MISC, , Disp: , Rfl:   Past Medical History:  Diagnosis Date   Coronary artery disease    Diabetes (HCC)    DVT (deep venous thrombosis) (HCC)    GERD (gastroesophageal reflux disease)    Hyperlipidemia    Hypertension    Peripheral vascular disease (HCC)     Past Surgical History:  Procedure Laterality Date   AMPUTATION Left 10/28/2018   Procedure: AMPUTATION BELOW KNEE;  Surgeon: Dew, Jason S, MD;  Location: [facility];  Service: Vascular;  Laterality: Left;   AMPUTATION TOE Left 02/10/2018     Procedure: AMPUTATION TOE;  Surgeon: Sharlotte Alamo, DPM;  Location: [facility];  Service: Podiatry;  Laterality: Left;   APPLICATION OF WOUND VAC Left 10/16/2018   Procedure: APPLICATION OF WOUND VAC;  Surgeon: Samara Deist, DPM;  Location: [facility];  Service: Podiatry;  Laterality: Left;   DORSAL SLIT N/A 01/12/2021   Procedure: DORSAL SLIT;  Surgeon: Billey Co, MD;  Location: [facility];  Service: Urology;  Laterality: N/A;   groin surgery     IRRIGATION AND DEBRIDEMENT FOOT Left 09/30/2018   Procedure: IRRIGATION AND DEBRIDEMENT FOOT;  Surgeon: Sharlotte Alamo, DPM;  Location: [facility];  Service: Podiatry;  Laterality: Left;   LOWER  EXTREMITY ANGIOGRAPHY Left 02/12/2018   Procedure: Lower Extremity Angiography;  Surgeon: Algernon Huxley, MD;  Location: [place] [facility];  Service: Cardiovascular;  Laterality: Left;   LOWER EXTREMITY ANGIOGRAPHY Left 10/01/2018   Procedure: Lower Extremity Angiography;  Surgeon: Algernon Huxley, MD;  Location: [place] [facility];  Service: Cardiovascular;  Laterality: Left;   LOWER EXTREMITY ANGIOGRAPHY Left 10/19/2018   Procedure: Lower Extremity Angiography;  Surgeon: Algernon Huxley, MD;  Location: [place] [facility];  Service: Cardiovascular;  Laterality: Left;   LOWER EXTREMITY ANGIOGRAPHY Left 10/20/2018   Procedure: LOWER EXTREMITY ANGIOGRAPHY;  Surgeon: Algernon Huxley, MD;  Location: [place] [facility];  Service: Cardiovascular;  Laterality: Left;   LOWER EXTREMITY ANGIOGRAPHY Right 11/25/2018   Procedure: LOWER EXTREMITY ANGIOGRAPHY;  Surgeon: Algernon Huxley, MD;  Location: [place] [facility];  Service: Cardiovascular;  Laterality: Right;   LOWER EXTREMITY ANGIOGRAPHY Right 01/10/2020   Procedure: LOWER EXTREMITY ANGIOGRAPHY;  Surgeon: Algernon Huxley, MD;  Location: [place] [facility];  Service: Cardiovascular;  Laterality: Right;   LOWER EXTREMITY ANGIOGRAPHY Right 12/11/2020   Procedure: LOWER EXTREMITY ANGIOGRAPHY;  Surgeon: Algernon Huxley, MD;  Location: [place] [facility];  Service: Cardiovascular;  Laterality: Right;   LOWER EXTREMITY INTERVENTION  02/12/2018   Procedure: LOWER EXTREMITY INTERVENTION;  Surgeon: Algernon Huxley, MD;  Location: [place] [facility];  Service: Cardiovascular;;   NECK SURGERY     TRANSMETATARSAL AMPUTATION Left 10/16/2018   Procedure: TRANSMETATARSAL AMPUTATION LEFT FOOT;  Surgeon: Samara Deist, DPM;  Location: [facility];  Service: Podiatry;  Laterality: Left;    Family History  Problem Relation Age of Onset   Diabetes Mother    Coronary artery disease Father    Hypertension Sister    Leukemia Brother     Social History:  reports that he quit smoking  about 8 months ago. His smoking use included cigarettes. He has a 47.00 pack-year smoking history. He has never used smokeless tobacco. He reports that he does not drink alcohol and does not use drugs.  Physical Exam Blood pressure (!) 169/86, pulse 79, temperature 97.9 F (36.6 C), temperature source Oral, resp. rate (!) 23, height _0  (1.93 m), weight 110.7 kg, SpO2 96 %. General: No acute distress.  Alert and oriented Left hand: Fingers well-perfused normal cap refill palp radial pulse.  Sensation seems to be intact throughout.  He has reasonable range of motion.  Range of motion of the long finger is limited due to pain and swelling.  He is nontender over the A1 and A2 pulleys of the long finger.  Minimal pain on passive stretch.  He looks to have a fluid collection in the distal tip extending along the ulnar side of the finger back to approximately the level of the PIP joint.  This area is where its the most tender.  Mild swelling and erythema extending onto the dorsum of his hand.  No fluctuance in the dorsal aspect of the hand or anywhere except for the fingertip  Results for orders placed or performed during the hospital encounter of 09/05/21 (from the past 48 hour(s))  CBC     Status: Abnormal   Collection Time: 09/06/21  6:00 AM  Result Value Ref Range   WBC 12.6 (H) 4.0 - 10.5 K/uL   RBC 4.85 4.22 - 5.81 MIL/uL   Hemoglobin 13.8 13.0 - 17.0 g/dL   HCT 42.2 39.0 - 52.0 %   MCV 87.0 80.0 - 100.0 fL   MCH 28.5 26.0 - 34.0 pg   MCHC 32.7 30.0 - 36.0 g/dL   RDW 13.7 11.5 - 15.5 %   Platelets 248 150 - 400 K/uL   nRBC 0.0 0.0 - 0.2 %    Comment: Performed at [facility], [place] [address], [place], [place] [address]  Basic metabolic panel     Status: Abnormal   Collection Time: 09/06/21  6:00 AM  Result Value Ref Range   Sodium 130 (L) 135 - 145 mmol/L   Potassium 4.3 3.5 - 5.1 mmol/L   Chloride 98 98 - 111 mmol/L   CO2 24 22 - 32 mmol/L   Glucose, Bld 461 (H) 70  - 99 mg/dL    Comment: Glucose reference range applies only to samples taken after fasting for at least 8 hours.   BUN 14 8 - 23 mg/dL   Creatinine, Ser 0.94 0.61 - 1.24 mg/dL   Calcium 8.9 8.9 - 10.3 mg/dL   GFR, Estimated >60 >60 mL/min    Comment: (NOTE) Calculated using the CKD-EPI Creatinine Equation (2021)    Anion gap 8 5 - 15    Comment: Performed at [facility], [place] [address], [place], [place] [address]  Resp Panel by RT-PCR (Flu A&B, Covid) Nasopharyngeal Swab     Status: None   Collection Time: 09/06/21  6:16 AM   Specimen: Nasopharyngeal Swab; Nasopharyngeal(NP) swabs in vial transport medium  Result Value Ref Range   SARS Coronavirus 2 by RT PCR NEGATIVE NEGATIVE    Comment: (NOTE) SARS-CoV-2 target nucleic acids are NOT DETECTED.  The SARS-CoV-2 RNA is generally detectable in upper respiratory specimens during the acute phase of infection. The lowest concentration of SARS-CoV-2 viral copies this assay can detect is 138 copies/mL. A negative result does not preclude SARS-Cov-2 infection and should not be used as the sole basis for treatment or other patient management decisions. A negative result may occur with  improper specimen collection/handling, submission of specimen other than nasopharyngeal swab, presence of viral mutation(s) within the areas targeted by this assay, and inadequate number of viral copies(<138 copies/mL). A negative result must be combined with clinical observations, patient history, and epidemiological information. The expected result is Negative.  Fact Sheet for Patients:  EntrepreneurPulse.com.au  Fact Sheet for Healthcare Providers:  IncredibleEmployment.be  This test is no t yet approved or cleared by the [place] FDA and  has been authorized for detection and/or diagnosis of SARS-CoV-2 by FDA under an Emergency Use Authorization (EUA). This EUA will remain  in effect  (meaning this test can be used) for the duration of the COVID-19 declaration under Section 564(b)(1) of the Act, 21 U.S.C.section 360bbb-3(b)(1), unless the authorization is terminated  or revoked sooner.       Influenza A by PCR NEGATIVE NEGATIVE  Influenza B by PCR NEGATIVE NEGATIVE    Comment: (NOTE) The Xpert Xpress SARS-CoV-2/FLU/RSV plus assay is intended as an aid in the diagnosis of influenza from Nasopharyngeal swab specimens and should not be used as a sole basis for treatment. Nasal washings and aspirates are unacceptable for Xpert Xpress SARS-CoV-2/FLU/RSV testing.  Fact Sheet for Patients: https://www.fda.gov/media/152166/download  Fact Sheet for Healthcare Providers: https://www.fda.gov/media/152162/download  This test is not yet approved or cleared by the [place] FDA and has been authorized for detection and/or diagnosis of SARS-CoV-2 by FDA under an Emergency Use Authorization (EUA). This EUA will remain in effect (meaning this test can be used) for the duration of the COVID-19 declaration under Section 564(b)(1) of the Act, 21 U.S.C. section 360bbb-3(b)(1), unless the authorization is terminated or revoked.  Performed at [facility], [address] [place], [place] [address]   Lactic acid, plasma     Status: Abnormal   Collection Time: 09/06/21  6:16 AM  Result Value Ref Range   Lactic Acid, Venous 2.2 (HH) 0.5 - 1.9 mmol/L    Comment: CRITICAL RESULT CALLED TO, READ BACK BY AND VERIFIED WITH: BLAIR, I. RN ON 09/06/2021 @ 0735 BY MECIAL J. Performed at [facility], [address] [place], [place] [address]     DG Wrist Complete Left  Result Date: 09/05/2021 CLINICAL DATA:  Left upper extremity pain. EXAM: LEFT HAND - COMPLETE 3+ VIEW; LEFT WRIST - COMPLETE 3+ VIEW COMPARISON:  None. FINDINGS: No acute fracture or dislocation. Mild osteopenia. No significant arthritic changes. The soft tissue swelling of  the dorsum of the hand. No radiopaque foreign object or soft tissue gas. IMPRESSION: 1. No acute fracture or dislocation. 2. Soft tissue swelling of the hand. Electronically Signed   By: Arash  Radparvar M.D.   On: 09/05/2021 19:48   DG Hand Complete Left  Result Date: 09/05/2021 CLINICAL DATA:  Left upper extremity pain. EXAM: LEFT HAND - COMPLETE 3+ VIEW; LEFT WRIST - COMPLETE 3+ VIEW COMPARISON:  None. FINDINGS: No acute fracture or dislocation. Mild osteopenia. No significant arthritic changes. The soft tissue swelling of the dorsum of the hand. No radiopaque foreign object or soft tissue gas. IMPRESSION: 1. No acute fracture or dislocation. 2. Soft tissue swelling of the hand. Electronically Signed   By: Arash  Radparvar M.D.   On: 09/05/2021 19:48    Assessment/Plan: Patient presents with left long finger infection.  This looks to be similar to a felon.  I recommended bedside I&D.  We discussed the risks and benefits and is interested in moving forward.  Preoperative diagnosis: Left long finger infection Postoperative diagnosis: Same  Procedure performed: Finger was anesthetized with lidocaine with epinephrine for digital block.  This was given time to work.  The long finger was prepped with Betadine.  Incision was made over the area of the fingertip distally where fluid collection could be visualized.  This was cultured.  There look to be purulence and fluid in a pocket between the dermis and epidermis.  The blistering epidermis was removed and this encompassed the ulnar aspect of the fingertip extending back towards the PIP joint.  All fluid was evacuated.  There was some extension of this blistering to the dorsal aspect of the fingertip and the eponychial fold.  This was debrided as well.  I did make an incision through the dermis on the ulnar aspect of the fingertip and explored the subcutaneous tissues of the pulp.  No fluid was identified in this   area.  I did irrigate the entire wound  copiously with saline.  Iodoform gauze was packed in the subcutaneous pocket and the pulp and it was wrapped with a soft dressing.  Recommend inpatient admission for blood sugar control as his blood sugar was over 400 in the emergency room.  Recommend broad-spectrum antibiotic coverage which can subsequently be focused depending on the culture results.  We will plan to follow along to ensure that he is heading in the right direction.  Regarding wound care recommend twice a day dressing changes.  For this would remove the current dressing and packing and soak the hand in solution of saline combined with Betadine.  Would then repack the fingertip wound with a small wick of gauze and rewrap.  Recommend keeping the hand elevated.  Call with any questions.  Cindra Presume 09/06/2021, 9:49 AM

## 2021-09-06 NOTE — H&P (Signed)
History and Physical    Austin [name] DOB: 12-22-1955 DOA: 09/05/2021  PCP: Theotis Burrow, MD  Patient coming from: Home.  I have personally briefly reviewed patient's old medical records in [place]  Chief Complaint: Left hand wound.  HPI: Austin [name] is a 65 y.o. male with medical history significant of CAD, type 2 diabetes, history of DVT, GERD, hyperlipidemia, hypertension, peripheral vascular disease who is coming to the emergency department referred by the urgent care center with complaints of progressively worse left middle finger and left hand edema associated with TTP, calor and erythema for the past 5 to 6 days.  The patient had an an ulcer developed on his left middle fingertip with no obvious trauma.  He accidentally caught it yesterday and it started draining pus.  He denied fever, chill, but complained of fatigue and malaise.  His appetite is decreased.  No dyspnea, wheezing or hemoptysis.  Denied chest pain, palpitations, diaphoresis, PND, orthopnea or pitting edema of the lower extremities.  Denied abdominal pain, nausea, vomiting, diarrhea, melena or hematochezia.  He occasionally gets constipated.  No flank pain, dysuria, frequency or hematuria.  No polyuria, polydipsia, polyphagia or blurred vision.  He stated his blood glucose has been fairly controlled.  ED Course: Initial vital signs were temperature 98.8 F, pulse 100, respiration 18, BP 135/84 mmHg O2 sat 99% on room air.  The patient received a 1000 mL LR bolus, vancomycin and ceftriaxone 2 g IVPB while in the emergency department.  Lab work: CBC showed a white count of 12.6, hemoglobin 13.8 g/dL platelets 248.  Hemoglobin A1c was 12.2%.  Lactic acid 2.2 and then 2.1 mmol/L.  Glucose 461 mg/dL.  The rest of the BMP results were normal when sodium corrected to hyperglycemia.  Imaging: Left hand x-ray did not show any acute fracture or dislocation.  There was soft tissue swelling of  the hand.  Please see images and full radiology report for further details.  Review of Systems: As per HPI otherwise all other systems reviewed and are negative.  Past Medical History:  Diagnosis Date   Coronary artery disease    Diabetes ([place])    DVT (deep venous thrombosis) (HCC)    GERD (gastroesophageal reflux disease)    Hyperlipidemia    Hypertension    Peripheral vascular disease ([place])     Past Surgical History:  Procedure Laterality Date   AMPUTATION Left 10/28/2018   Procedure: AMPUTATION BELOW KNEE;  Surgeon: Algernon Huxley, MD;  Location: [facility];  Service: Vascular;  Laterality: Left;   AMPUTATION TOE Left 02/10/2018   Procedure: AMPUTATION TOE;  Surgeon: Sharlotte Alamo, DPM;  Location: [facility];  Service: Podiatry;  Laterality: Left;   APPLICATION OF WOUND VAC Left 10/16/2018   Procedure: APPLICATION OF WOUND VAC;  Surgeon: Samara Deist, DPM;  Location: [facility];  Service: Podiatry;  Laterality: Left;   DORSAL SLIT N/A 01/12/2021   Procedure: DORSAL SLIT;  Surgeon: Billey Co, MD;  Location: [facility];  Service: Urology;  Laterality: N/A;   groin surgery     IRRIGATION AND DEBRIDEMENT FOOT Left 09/30/2018   Procedure: IRRIGATION AND DEBRIDEMENT FOOT;  Surgeon: Sharlotte Alamo, DPM;  Location: [facility];  Service: Podiatry;  Laterality: Left;   LOWER EXTREMITY ANGIOGRAPHY Left 02/12/2018   Procedure: Lower Extremity Angiography;  Surgeon: Algernon Huxley, MD;  Location: [place] [facility];  Service: Cardiovascular;  Laterality: Left;   LOWER EXTREMITY ANGIOGRAPHY Left 10/01/2018   Procedure:  Lower Extremity Angiography;  Surgeon: Algernon Huxley, MD;  Location: [place] [facility];  Service: Cardiovascular;  Laterality: Left;   LOWER EXTREMITY ANGIOGRAPHY Left 10/19/2018   Procedure: Lower Extremity Angiography;  Surgeon: Algernon Huxley, MD;  Location: [place] [facility];  Service: Cardiovascular;  Laterality: Left;   LOWER EXTREMITY ANGIOGRAPHY Left 10/20/2018   Procedure: LOWER  EXTREMITY ANGIOGRAPHY;  Surgeon: Algernon Huxley, MD;  Location: [place] [facility];  Service: Cardiovascular;  Laterality: Left;   LOWER EXTREMITY ANGIOGRAPHY Right 11/25/2018   Procedure: LOWER EXTREMITY ANGIOGRAPHY;  Surgeon: Algernon Huxley, MD;  Location: [place] [facility];  Service: Cardiovascular;  Laterality: Right;   LOWER EXTREMITY ANGIOGRAPHY Right 01/10/2020   Procedure: LOWER EXTREMITY ANGIOGRAPHY;  Surgeon: Algernon Huxley, MD;  Location: [place] [facility];  Service: Cardiovascular;  Laterality: Right;   LOWER EXTREMITY ANGIOGRAPHY Right 12/11/2020   Procedure: LOWER EXTREMITY ANGIOGRAPHY;  Surgeon: Algernon Huxley, MD;  Location: [place] [facility];  Service: Cardiovascular;  Laterality: Right;   LOWER EXTREMITY INTERVENTION  02/12/2018   Procedure: LOWER EXTREMITY INTERVENTION;  Surgeon: Algernon Huxley, MD;  Location: [place] [facility];  Service: Cardiovascular;;   NECK SURGERY     TRANSMETATARSAL AMPUTATION Left 10/16/2018   Procedure: TRANSMETATARSAL AMPUTATION LEFT FOOT;  Surgeon: Samara Deist, DPM;  Location: [facility];  Service: Podiatry;  Laterality: Left;   Social History  reports that he quit smoking about 8 months ago. His smoking use included cigarettes. He has a 47.00 pack-year smoking history. He has never used smokeless tobacco. He reports that he does not drink alcohol and does not use drugs.  No Known Allergies  Family History  Problem Relation Age of Onset   Diabetes Mother    Coronary artery disease Father    Hypertension Sister    Leukemia Brother    Prior to Admission medications   Medication Sig Start Date End Date Taking? Authorizing Provider  ACCU-CHEK GUIDE test strip  06/26/20   [provider]  apixaban (ELIQUIS) 5 MG TABS tablet Take 1 tablet (5 mg total) by mouth 2 (two) times daily. 02/12/18   Nicholes Mango, MD  aspirin EC 81 MG tablet Take 1 tablet (81 mg total) by mouth daily. 01/10/20   Algernon Huxley, MD  atorvastatin (LIPITOR) 80 MG tablet Take  1 tablet (80 mg total) by mouth daily at 6 PM. 02/12/18   Gouru, Aruna, MD  Blood Glucose Monitoring Suppl (ACCU-CHEK GUIDE) w/Device KIT USE 1 KIT AS DIRECTED FOUR TIMES A DAY 10/27/20   [provider]  Docusate Sodium (DSS) 100 MG CAPS Take 100 mg by mouth in the morning and at bedtime.    [provider]  HUMALOG KWIKPEN 100 UNIT/ML KwikPen Inject 24 Units into the skin 3 (three) times daily before meals. 12/08/19   [provider]  insulin aspart (NOVOLOG) 100 UNIT/ML injection Inject 10 Units into the skin 3 (three) times daily with meals. 10/30/18   Bettey Costa, MD  insulin glargine (LANTUS) 100 UNIT/ML injection Inject 0.3 mLs (30 Units total) into the skin at bedtime. 10/30/18   Bettey Costa, MD  insulin glargine, 1 Unit Dial, (TOUJEO SOLOSTAR) 300 UNIT/ML Solostar Pen Inject 24 Units into the skin at bedtime.    [provider]  losartan (COZAAR) 25 MG tablet Take 25 mg by mouth daily. 11/13/20   [provider]  polyethylene glycol (MIRALAX) packet Take 17 g by mouth daily. 10/04/18   Sudini,  Srikar, MD  TRUEPLUS 5-BEVEL PEN NEEDLES 31G X 6 MM MISC  10/25/20   [provider]   Physical Exam: Vitals:   09/05/21 2333 09/06/21 0230 09/06/21 0607 09/06/21 0744  BP: (!) 117/100 (!) 151/87 (!) 168/96 (!) 169/86  Pulse: 84 89 72 79  Resp: '18 14 15 ' (!) 23  Temp: 99 F (37.2 C) 98.1 F (36.7 C) 98 F (36.7 C) 97.9 F (36.6 C)  TempSrc: Oral Oral Oral Oral  SpO2: 100% 94% 96%   Weight:   111.1 kg 110.7 kg  Height:    '6\' 4"'  (1.93 m)   Constitutional: Chronically ill-appearing.  NAD, calm, comfortable Eyes: PERRL, lids and conjunctivae normal ENMT: Mucous membranes are moist. Posterior pharynx clear of any exudate or lesions.  Poor state of repair of dentition with severe gingivitis. Neck: normal, supple, no masses, no thyromegaly Respiratory: clear to auscultation bilaterally, no wheezing, no crackles. Normal respiratory effort. No accessory  muscle use.  Cardiovascular: Regular rate and rhythm, no murmurs / rubs / gallops. No extremity edema. 2+ pedal pulses. No carotid bruits.  Abdomen: Obese, no distention.  Bowel sounds positive.  Soft, no tenderness, no masses palpated. No hepatosplenomegaly.  Musculoskeletal: no clubbing / cyanosis.  Left BKA.  Severely impaired ROM of left hand, no contractures. Normal muscle tone.  Skin: Positive calor, edema, erythema and TTP of left hand.  Please see pictures below. Neurologic: CN 2-12 grossly intact. Sensation intact, DTR normal. Strength 5/5 in all 4.  Psychiatric: Normal judgment and insight. Alert and oriented x 3. Normal mood.        Labs on Admission: I have personally reviewed following labs and imaging studies  CBC: Recent Labs  Lab 09/06/21 0600  WBC 12.6*  HGB 13.8  HCT 42.2  MCV 87.0  PLT 025    Basic Metabolic Panel: Recent Labs  Lab 09/06/21 0600  NA 130*  K 4.3  CL 98  CO2 24  GLUCOSE 461*  BUN 14  CREATININE 0.94  CALCIUM 8.9    GFR: Estimated Creatinine Clearance: 105.4 mL/min (by C-G formula based on SCr of 0.94 mg/dL).  Liver Function Tests: No results for input(s): AST, ALT, ALKPHOS, BILITOT, PROT, ALBUMIN in the last 168 hours.  Radiological Exams on Admission: DG Wrist Complete Left  Result Date: 09/05/2021 CLINICAL DATA:  Left upper extremity pain. EXAM: LEFT HAND - COMPLETE 3+ VIEW; LEFT WRIST - COMPLETE 3+ VIEW COMPARISON:  None. FINDINGS: No acute fracture or dislocation. Mild osteopenia. No significant arthritic changes. The soft tissue swelling of the dorsum of the hand. No radiopaque foreign object or soft tissue gas. IMPRESSION: 1. No acute fracture or dislocation. 2. Soft tissue swelling of the hand. Electronically Signed   By: Anner Crete M.D.   On: 09/05/2021 19:48   DG Hand Complete Left  Result Date: 09/05/2021 CLINICAL DATA:  Left upper extremity pain. EXAM: LEFT HAND - COMPLETE 3+ VIEW; LEFT WRIST - COMPLETE 3+ VIEW  COMPARISON:  None. FINDINGS: No acute fracture or dislocation. Mild osteopenia. No significant arthritic changes. The soft tissue swelling of the dorsum of the hand. No radiopaque foreign object or soft tissue gas. IMPRESSION: 1. No acute fracture or dislocation. 2. Soft tissue swelling of the hand. Electronically Signed   By: Anner Crete M.D.   On: 09/05/2021 19:48    EKG: Independently reviewed  Assessment/Plan Principal Problem:   Infection of left hand Admit to MedSurg/inpatient. Continue IV fluids. Analgesics as needed.  Continue ceftriaxone 2 g  IVPB daily. Continue vancomycin per pharmacy. Consult wound/ostomy care.  Follow-up CBC and CMP in AM.  Active Problems:   Essential hypertension Continue losartan 25 mg p.o. daily per Monitor BP, renal function electrolytes.    Hyperlipidemia Continue atorvastatin 80 mg p.o. daily.    Diabetes mellitus type 2, uncomplicated (HCC) Carbohydrate modified diet. CBG monitoring with RI SS. Check hemoglobin A1c.    Coronary artery disease Continue aspirin, ARB and statin.    Pressure injury of coccygeal region, stage 1 POA Begin preventive measures.     DVT prophylaxis: Lovenox SQ. Code Status:   Full code. Family Communication:   Disposition Plan:   Patient is from:  Home.  Anticipated DC to:  Home.  Anticipated DC date:  09/07/2021 or 09/08/2021.  Anticipated DC barriers: Clinical status.  Consults called:   Admission status:  Inpatient/MedSurg.   Severity of Illness: High severity after presenting with uncontrolled diabetes in the setting of acute cellulitis of the left hand.  The patient will remain for IV hydration, BG control and IV antibiotic therapy for 2 to 3 days.  Reubin Milan MD [facility]  How to contact the [facility] Attending or Consulting provider [place] or covering provider during after hours [place], for this patient?   Check the care team in [facility] and look for a) attending/consulting [facility]  provider listed and b) the [facility] team listed Log into [email or web address] and use [place]'s universal password to access. If you do not have the password, please contact the hospital operator. Locate the [facility] provider you are looking for under [facility] and page to a number that you can be directly reached. If you still have difficulty reaching the provider, please page the [facility] (Director on Call) for the Hospitalists listed on amion for assistance.  09/06/2021, 8:16 AM   This document was prepared using Dragon voice recognition software and may contain some unintended transcription errors.

## 2021-09-06 NOTE — ED Notes (Signed)
IV team at bedside 

## 2021-09-07 DIAGNOSIS — L089 Local infection of the skin and subcutaneous tissue, unspecified: Secondary | ICD-10-CM | POA: Diagnosis not present

## 2021-09-07 DIAGNOSIS — I739 Peripheral vascular disease, unspecified: Secondary | ICD-10-CM

## 2021-09-07 DIAGNOSIS — E1151 Type 2 diabetes mellitus with diabetic peripheral angiopathy without gangrene: Secondary | ICD-10-CM

## 2021-09-07 DIAGNOSIS — E782 Mixed hyperlipidemia: Secondary | ICD-10-CM | POA: Diagnosis not present

## 2021-09-07 DIAGNOSIS — I10 Essential (primary) hypertension: Secondary | ICD-10-CM | POA: Diagnosis not present

## 2021-09-07 DIAGNOSIS — E872 Acidosis, unspecified: Secondary | ICD-10-CM | POA: Diagnosis not present

## 2021-09-07 DIAGNOSIS — L89151 Pressure ulcer of sacral region, stage 1: Secondary | ICD-10-CM

## 2021-09-07 LAB — COMPREHENSIVE METABOLIC PANEL
ALT: 15 U/L (ref 0–44)
AST: 14 U/L — ABNORMAL LOW (ref 15–41)
Albumin: 2.6 g/dL — ABNORMAL LOW (ref 3.5–5.0)
Alkaline Phosphatase: 68 U/L (ref 38–126)
Anion gap: 7 (ref 5–15)
BUN: 12 mg/dL (ref 8–23)
CO2: 24 mmol/L (ref 22–32)
Calcium: 8.6 mg/dL — ABNORMAL LOW (ref 8.9–10.3)
Chloride: 103 mmol/L (ref 98–111)
Creatinine, Ser: 0.83 mg/dL (ref 0.61–1.24)
GFR, Estimated: >60 mL/min (ref >60)
Glucose, Bld: 261 mg/dL — ABNORMAL HIGH (ref 70–99)
Potassium: 4 mmol/L (ref 3.5–5.1)
Sodium: 134 mmol/L — ABNORMAL LOW (ref 135–145)
Total Bilirubin: 0.8 mg/dL (ref 0.3–1.2)
Total Protein: 7 g/dL (ref 6.5–8.1)

## 2021-09-07 LAB — GLUCOSE, CAPILLARY
Glucose-Capillary: 238 mg/dL — ABNORMAL HIGH (ref 70–99)
Glucose-Capillary: 344 mg/dL — ABNORMAL HIGH (ref 70–99)
Glucose-Capillary: 264 mg/dL — ABNORMAL HIGH (ref 70–99)
Glucose-Capillary: 435 mg/dL — ABNORMAL HIGH (ref 70–99)

## 2021-09-07 LAB — HEMOGLOBIN A1C
Hgb A1c MFr Bld: 12.8 % — ABNORMAL HIGH (ref 4.8–5.6)
Mean Plasma Glucose: 320.66 mg/dL

## 2021-09-07 LAB — CBC
HCT: 38 % — ABNORMAL LOW (ref 39.0–52.0)
Hemoglobin: 12.6 g/dL — ABNORMAL LOW (ref 13.0–17.0)
MCH: 28.1 pg (ref 26.0–34.0)
MCHC: 33.2 g/dL (ref 30.0–36.0)
MCV: 84.8 fL (ref 80.0–100.0)
Platelets: 258 10*3/uL (ref 150–400)
RBC: 4.48 MIL/uL (ref 4.22–5.81)
RDW: 13.6 % (ref 11.5–15.5)
WBC: 12.8 10*3/uL — ABNORMAL HIGH (ref 4.0–10.5)
nRBC: 0 % (ref 0.0–0.2)

## 2021-09-07 MED ORDER — INSULIN ASPART 100 UNIT/ML IJ SOLN
7.0000 [IU] | Freq: Once | INTRAMUSCULAR | Status: AC
  Administered 2021-09-07: 7 [IU] via SUBCUTANEOUS

## 2021-09-07 MED ORDER — SODIUM CHLORIDE 0.9 % IV SOLN: INTRAVENOUS | Status: DC | PRN

## 2021-09-07 NOTE — Plan of Care (Signed)
  Problem: Education: Goal: Knowledge of General Education information will improve Description Including pain rating scale, medication(s)/side effects and non-pharmacologic comfort measures Outcome: Progressing   

## 2021-09-07 NOTE — Progress Notes (Signed)
Subjective: 65 year old ill male with a medical history significant for insulin-dependent diabetes that is uncontrolled.  He is resting in bed today on evaluation.  He does not report any fevers or chills.  Objective: Vital signs in last 24 hours: Temp:  [97.9 F (36.6 C)-98.8 F (37.1 C)] 98.1 F (36.7 C) (12/02 0503) Pulse Rate:  [72-104] 92 (12/02 0503) Resp:  [15-21] 15 (12/02 0503) BP: (116-158)/(82-104) 116/82 (12/02 0503) SpO2:  [97 %-99 %] 97 % (12/01 1714) Last BM Date: 09/06/21  Intake/Output from previous day: 12/01 0701 - 12/02 0700 In: 1981.3 [I.V.:1981.3] Out: 600 [Urine:600] Intake/Output this shift: No intake/output data recorded.  General: No acute distress, well-developed, ill-appearing male Left hand: Palpable radial pulse.  Sensation intact.  Range of motion of long finger limited due to pain and swelling.  Range of motion of all other digits seems to be normal, but limited due to swelling and pain of long finger.  He has tenderness over the flexor portion of the left long finger, proximally and distally.  Some tenderness present ulnarly, laterally and along the extensor portion, but not as intense.  No exposed bone is noted distally.  Thickened eschar noted at the distal portion of the long finger with what appears to be some necrosis.  Do not appreciate any fluctuance in fingers or hand.  Mild hand swelling is noted.         Lab Results:  CBC Latest Ref Rng & Units 09/07/2021 09/06/2021 03/31/2021  WBC 4.0 - 10.5 K/uL 12.8(H) 12.6(H) 8.4  Hemoglobin 13.0 - 17.0 g/dL 12.6(L) 13.8 13.9  Hematocrit 39.0 - 52.0 % 38.0(L) 42.2 41.3  Platelets 150 - 400 K/uL 258 248 199    BMET Recent Labs    09/06/21 0600 09/07/21 0344  NA 130* 134*  K 4.3 4.0  CL 98 103  CO2 24 24  GLUCOSE 461* 261*  BUN 14 12  CREATININE 0.94 0.83  CALCIUM 8.9 8.6*   PT/INR No results for input(s): LABPROT, INR in the last 72 hours. ABG No results for input(s): PHART, HCO3  in the last 72 hours.  Invalid input(s): PCO2, PO2  Studies/Results: DG Wrist Complete Left  Result Date: 09/05/2021 CLINICAL DATA:  Left upper extremity pain. EXAM: LEFT HAND - COMPLETE 3+ VIEW; LEFT WRIST - COMPLETE 3+ VIEW COMPARISON:  None. FINDINGS: No acute fracture or dislocation. Mild osteopenia. No significant arthritic changes. The soft tissue swelling of the dorsum of the hand. No radiopaque foreign object or soft tissue gas. IMPRESSION: 1. No acute fracture or dislocation. 2. Soft tissue swelling of the hand. Electronically Signed   By: Elgie Collard M.D.   On: 09/05/2021 19:48   DG Hand Complete Left  Result Date: 09/05/2021 CLINICAL DATA:  Left upper extremity pain. EXAM: LEFT HAND - COMPLETE 3+ VIEW; LEFT WRIST - COMPLETE 3+ VIEW COMPARISON:  None. FINDINGS: No acute fracture or dislocation. Mild osteopenia. No significant arthritic changes. The soft tissue swelling of the dorsum of the hand. No radiopaque foreign object or soft tissue gas. IMPRESSION: 1. No acute fracture or dislocation. 2. Soft tissue swelling of the hand. Electronically Signed   By: Elgie Collard M.D.   On: 09/05/2021 19:48    Anti-infectives: Anti-infectives (From admission, onward)    Start     Dose/Rate Route Frequency Ordered Stop   09/07/21 0900  cefTRIAXone (ROCEPHIN) 2 g in sodium chloride 0.9 % 100 mL IVPB        2 g 200 mL/hr over 30  Minutes Intravenous Every 24 hours 09/06/21 1716     09/07/21 0630  cefTRIAXone (ROCEPHIN) 2 g in sodium chloride 0.9 % 100 mL IVPB  Status:  Discontinued        2 g 200 mL/hr over 30 Minutes Intravenous  Once 09/06/21 1709 09/06/21 1716   09/06/21 2000  vancomycin (VANCOREADY) IVPB 1750 mg/350 mL        1,750 mg 175 mL/hr over 120 Minutes Intravenous Every 12 hours 09/06/21 0635     09/06/21 0630  cefTRIAXone (ROCEPHIN) 2 g in sodium chloride 0.9 % 100 mL IVPB        2 g 200 mL/hr over 30 Minutes Intravenous  Once 09/06/21 0617 09/06/21 0921   09/06/21  0630  vancomycin (VANCOREADY) IVPB 2000 mg/400 mL        2,000 mg 200 mL/hr over 120 Minutes Intravenous  Once 09/06/21 0622 09/06/21 1120       Assessment/Plan: Left long finger infection:  Recommend continued inpatient admission for blood sugar control and IV antibiotics.  Recommend continuing with twice daily dressing changes:  -Remove current packing and soak hand in solution of saline combined with Betadine.  Then repacked the fingertip wound with small wick of gauze and rewrap.  Recommend keeping hand elevated.  No current surgical plans at this time, will plan to reevaluate on Monday, 09/10/2021.  Case discussed with Dr. Claudia Desanctis.   LOS: 1 day    Charlies Constable, PA-C 09/07/2021

## 2021-09-07 NOTE — Progress Notes (Signed)
PROGRESS NOTE  Austin [name] [id number] DOB: July 09, 1956 DOA: 09/05/2021 PCP: Theotis Burrow, MD   LOS: 1 day   Brief narrative: Austin [name] is a 65 y.o. male with past medical history of coronary artery disease, diabetes mellitus type 2, history of DVT, GERD, hyperlipidemia, hypertension and peripheral vascular disease who presented to the hospital with progressive swelling redness of the left middle finger with her left hand and edema for 5 to 6 days.  Patient had initially developed an ulcer on the left middle fingertip without any obvious trauma and symptoms continued to get worse.  In the ED patient had mild leukocytosis.  Hemoglobin A1c was 12.2.  Lactic acid was elevated at 2.2.  Blood glucose level elevated at 461.  X-ray of the left hand did not show any fracture or dislocation but soft tissue swelling.  Patient was mildly tachycardic.  Patient received Ringer lactate bolus vancomycin  and ceftriaxone in the ED and patient was admitted hospital for further evaluation and treatment.  Assessment/Plan:  Principal Problem:   Infection of left hand Active Problems:   Essential hypertension   Hyperlipidemia   Type 2 diabetes mellitus with diabetic peripheral angiopathy without gangrene (HCC)   Coronary artery disease   Lactic acidosis   Peripheral vascular disease (HCC)   Pressure injury of coccygeal region, stage 1   Left long finger infection with cellulitis of the left dorsal hand. Patient was seen by plastic surgery and underwent incision and drainage.  Surgery recommends twice daily dressing.  On vancomycin and Rocephin.  Continue IV antibiotics as per surgical recommendation.  Still has significant cellulitis of the dorsal hand.    Essential hypertension Continue losartan.  On IV fluids as well.  Continue to monitor closely.    Hyperlipidemia Continue Lipitor.     Diabetes mellitus type 2, uncomplicated  Latest hemoglobin A1c of 12.2.  Continue sliding  scale insulin, diabetic diet, Accu-Cheks.    Coronary artery disease Continue aspirin, ARB and statin.     Pressure injury of coccygeal region, stage 1 POA Continue prevention protocol.  DVT prophylaxis:  apixaban (ELIQUIS) tablet 5 mg    Code Status: Full code  Family Communication: Spoke with the patient at bedside  Status is: Inpatient  Remains inpatient appropriate because: IV antibiotics, status post surgical intervention    Consultants: Plastic surgery  Procedures: I&D of the left long finger  Anti-infectives:  Vancomycin and Rocephin IV  Anti-infectives (From admission, onward)    Start     Dose/Rate Route Frequency Ordered Stop   09/07/21 0900  cefTRIAXone (ROCEPHIN) 2 g in sodium chloride 0.9 % 100 mL IVPB        2 g 200 mL/hr over 30 Minutes Intravenous Every 24 hours 09/06/21 1716     09/07/21 0630  cefTRIAXone (ROCEPHIN) 2 g in sodium chloride 0.9 % 100 mL IVPB  Status:  Discontinued        2 g 200 mL/hr over 30 Minutes Intravenous  Once 09/06/21 1709 09/06/21 1716   09/06/21 2000  vancomycin (VANCOREADY) IVPB 1750 mg/350 mL        1,750 mg 175 mL/hr over 120 Minutes Intravenous Every 12 hours 09/06/21 0635     09/06/21 0630  cefTRIAXone (ROCEPHIN) 2 g in sodium chloride 0.9 % 100 mL IVPB        2 g 200 mL/hr over 30 Minutes Intravenous  Once 09/06/21 0617 09/06/21 0921   09/06/21 0630  vancomycin (VANCOREADY) IVPB 2000 mg/400 mL  2,000 mg 200 mL/hr over 120 Minutes Intravenous  Once 09/06/21 0622 09/06/21 1120       Subjective: Today, patient was seen and examined at bedside.  Patient complains of pain around 7/ 10 over the left hand and finger.  Denies any fever, chills or rigor.  Objective: Vitals:   09/06/21 2205 09/07/21 0503  BP: (!) 127/104 116/82  Pulse: 95 92  Resp: 15 15  Temp: 98.4 F (36.9 C) 98.1 F (36.7 C)  SpO2:      Intake/Output Summary (Last 24 hours) at 09/07/2021 1032 Last data filed at 09/07/2021 0600 Gross per  24 hour  Intake 1981.25 ml  Output 600 ml  Net 1381.25 ml   Filed Weights   09/06/21 0607 09/06/21 0744  Weight: 111.1 kg 110.7 kg   Body mass index is 29.7 kg/m.   Physical Exam: GENERAL: Patient is alert awake and oriented. Not in obvious distress. HENT: No scleral pallor or icterus. Pupils equally reactive to light. Oral mucosa is moist NECK: is supple, no gross swelling noted. CHEST: Clear to auscultation. No crackles or wheezes.  Diminished breath sounds bilaterally. CVS: S1 and S2 heard, no murmur. Regular rate and rhythm.  ABDOMEN: Soft, non-tender, bowel sounds are present. EXTREMITIES: Left hand with gross dorsal edema and cellulitis.  Left long finger with dressing.  Left below-knee amputation.  Right lower extremity unavailable.  Left below-knee amputation.  Right lower extremity on Unna boot. CNS: Cranial nerves are intact. No focal motor deficits. SKIN: warm and dry, right lower extremity on Unna boot.  Data Review: I have personally reviewed the following laboratory data and studies,  CBC: Recent Labs  Lab 09/06/21 0600 09/07/21 0344  WBC 12.6* 12.8*  HGB 13.8 12.6*  HCT 42.2 38.0*  MCV 87.0 84.8  PLT 248 0000000   Basic Metabolic Panel: Recent Labs  Lab 09/06/21 0600 09/07/21 0344  NA 130* 134*  K 4.3 4.0  CL 98 103  CO2 24 24  GLUCOSE 461* 261*  BUN 14 12  CREATININE 0.94 0.83  CALCIUM 8.9 8.6*   Liver Function Tests: Recent Labs  Lab 09/07/21 0344  AST 14*  ALT 15  ALKPHOS 68  BILITOT 0.8  PROT 7.0  ALBUMIN 2.6*   No results for input(s): LIPASE, AMYLASE in the last 168 hours. No results for input(s): AMMONIA in the last 168 hours. Cardiac Enzymes: No results for input(s): CKTOTAL, CKMB, CKMBINDEX, TROPONINI in the last 168 hours. BNP (last 3 results) Recent Labs    03/30/21 0958  BNP 67.6    ProBNP (last 3 results) No results for input(s): PROBNP in the last 8760 hours.  CBG: Recent Labs  Lab 09/06/21 1318 09/06/21 1725  09/06/21 2203 09/07/21 0748  GLUCAP 460* 367* 285* 238*   Recent Results (from the past 240 hour(s))  Resp Panel by RT-PCR (Flu A&B, Covid) Nasopharyngeal Swab     Status: None   Collection Time: 09/06/21  6:16 AM   Specimen: Nasopharyngeal Swab; Nasopharyngeal(NP) swabs in vial transport medium  Result Value Ref Range Status   SARS Coronavirus 2 by RT PCR NEGATIVE NEGATIVE Final    Comment: (NOTE) SARS-CoV-2 target nucleic acids are NOT DETECTED.  The SARS-CoV-2 RNA is generally detectable in upper respiratory specimens during the acute phase of infection. The lowest concentration of SARS-CoV-2 viral copies this assay can detect is 138 copies/mL. A negative result does not preclude SARS-Cov-2 infection and should not be used as the sole basis for treatment or other patient  management decisions. A negative result may occur with  improper specimen collection/handling, submission of specimen other than nasopharyngeal swab, presence of viral mutation(s) within the areas targeted by this assay, and inadequate number of viral copies(<138 copies/mL). A negative result must be combined with clinical observations, patient history, and epidemiological information. The expected result is Negative.  Fact Sheet for Patients:  EntrepreneurPulse.com.au  Fact Sheet for Healthcare Providers:  IncredibleEmployment.be  This test is no t yet approved or cleared by the [place] FDA and  has been authorized for detection and/or diagnosis of SARS-CoV-2 by FDA under an Emergency Use Authorization (EUA). This EUA will remain  in effect (meaning this test can be used) for the duration of the COVID-19 declaration under Section 564(b)(1) of the Act, 21 U.S.C.section 360bbb-3(b)(1), unless the authorization is terminated  or revoked sooner.       Influenza A by PCR NEGATIVE NEGATIVE Final   Influenza B by PCR NEGATIVE NEGATIVE Final    Comment: (NOTE) The  Xpert Xpress SARS-CoV-2/FLU/RSV plus assay is intended as an aid in the diagnosis of influenza from Nasopharyngeal swab specimens and should not be used as a sole basis for treatment. Nasal washings and aspirates are unacceptable for Xpert Xpress SARS-CoV-2/FLU/RSV testing.  Fact Sheet for Patients: EntrepreneurPulse.com.au  Fact Sheet for Healthcare Providers: IncredibleEmployment.be  This test is not yet approved or cleared by the [place] FDA and has been authorized for detection and/or diagnosis of SARS-CoV-2 by FDA under an Emergency Use Authorization (EUA). This EUA will remain in effect (meaning this test can be used) for the duration of the COVID-19 declaration under Section 564(b)(1) of the Act, 21 U.S.C. section 360bbb-3(b)(1), unless the authorization is terminated or revoked.  Performed at [facility], [place] [address], [place], [place] [address]   Aerobic Culture w Gram Stain (superficial specimen)     Status: None (Preliminary result)   Collection Time: 09/06/21  8:56 AM   Specimen: Wound  Result Value Ref Range Status   Specimen Description   Final    WOUND LT FINGER Performed at [place] [address], [place], [place] [address]    Special Requests   Final    Normal Performed at [facility], [place] [address], [place], [place] [address]    Gram Stain   Final    MODERATE WBC PRESENT,BOTH PMN AND MONONUCLEAR ABUNDANT GRAM POSITIVE COCCI ABUNDANT GRAM NEGATIVE RODS FEW GRAM VARIABLE ROD    Culture   Final    CULTURE REINCUBATED FOR BETTER GROWTH Performed at [facility], [place] [address], [place], [place] [address]    Report Status PENDING  Incomplete     Studies: DG Wrist Complete Left  Result Date: 09/05/2021 CLINICAL DATA:  Left upper extremity pain. EXAM: LEFT HAND - COMPLETE 3+ VIEW; LEFT WRIST - COMPLETE 3+ VIEW COMPARISON:  None. FINDINGS: No  acute fracture or dislocation. Mild osteopenia. No significant arthritic changes. The soft tissue swelling of the dorsum of the hand. No radiopaque foreign object or soft tissue gas. IMPRESSION: 1. No acute fracture or dislocation. 2. Soft tissue swelling of the hand. Electronically Signed   By: Anner Crete M.D.   On: 09/05/2021 19:48   DG Hand Complete Left  Result Date: 09/05/2021 CLINICAL DATA:  Left upper extremity pain. EXAM: LEFT HAND - COMPLETE 3+ VIEW; LEFT WRIST - COMPLETE 3+ VIEW COMPARISON:  None. FINDINGS: No acute fracture or dislocation. Mild osteopenia. No significant arthritic changes. The  soft tissue swelling of the dorsum of the hand. No radiopaque foreign object or soft tissue gas. IMPRESSION: 1. No acute fracture or dislocation. 2. Soft tissue swelling of the hand. Electronically Signed   By: Elgie Collard M.D.   On: 09/05/2021 19:48      Joycelyn Das, MD  [facility] 09/07/2021  If 7PM-7AM, please contact night-coverage

## 2021-09-07 NOTE — Progress Notes (Addendum)
Inpatient Diabetes Program Recommendations  AACE/ADA: New Consensus Statement on Inpatient Glycemic Control (2015)  Target Ranges:  Prepandial:   less than 140 mg/dL      Peak postprandial:   less than 180 mg/dL (1-2 hours)      Critically ill patients:  140 - 180 mg/dL   Lab Results  Component Value Date   GLUCAP 285 (H) 09/06/2021   HGBA1C 12.8 (H) 09/07/2021    Review of Glycemic Control  Latest Reference Range & Units 09/06/21 17:25 09/06/21 22:03  Glucose-Capillary 70 - 99 mg/dL 832 (H) 919 (H)    Latest Reference Range & Units 09/07/21 03:44  Glucose 70 - 99 mg/dL 166 (H)   Diabetes history: DM 2 Outpatient Diabetes medications: Humalog 20 units tid, Lantus 30 units qhs Current orders for Inpatient glycemic control:  Novolog 0-20 units tid   A1c 12.2% on 12/1  Inpatient Diabetes Program Recommendations:    Fasting 261 -  Add Novolog 5 units tid meal coverage if eating >50% of meals -  Add Novolog hs scale   Thanks,  Christena Deem RN, MSN, BC-ADM Inpatient Diabetes Coordinator Team Pager [phone] (8a-5p)

## 2021-09-07 NOTE — Telephone Encounter (Signed)
Patient reach out to informed that he was at [facility] and was diagnosis with gangrene on the tip of his finger.I recommended the patient the providers at [facility] will reach to the office if they need to speak with his provider.

## 2021-09-07 NOTE — Consult Note (Signed)
WOC Nurse wound consult note Consultation was completed by review of records, images and assistance from the bedside nurse/clinical staff.  Reason for Consult:RLE wound Wound type: partial thickness ulceration Pressure Injury POA: NA Measurement:2cm x 1cm x 0.1cm  Drainage (amount, consistency, odor) none Periwound: edema  Dressing procedure/placement/frequency:  Continue silicone foam to protect and insulate wound, change every 3 days  Discussed POC with patient and bedside nurse.  Re consult if needed, will not follow at this time. Thanks  Allina Riches [employer], RN,CWOCN, CNS, CWON-AP [phone])

## 2021-09-08 DIAGNOSIS — E872 Acidosis, unspecified: Secondary | ICD-10-CM | POA: Diagnosis not present

## 2021-09-08 DIAGNOSIS — I10 Essential (primary) hypertension: Secondary | ICD-10-CM | POA: Diagnosis not present

## 2021-09-08 DIAGNOSIS — E782 Mixed hyperlipidemia: Secondary | ICD-10-CM | POA: Diagnosis not present

## 2021-09-08 DIAGNOSIS — L089 Local infection of the skin and subcutaneous tissue, unspecified: Secondary | ICD-10-CM | POA: Diagnosis not present

## 2021-09-08 LAB — GLUCOSE, CAPILLARY
Glucose-Capillary: 339 mg/dL — ABNORMAL HIGH (ref 70–99)
Glucose-Capillary: 329 mg/dL — ABNORMAL HIGH (ref 70–99)
Glucose-Capillary: 371 mg/dL — ABNORMAL HIGH (ref 70–99)
Glucose-Capillary: 300 mg/dL — ABNORMAL HIGH (ref 70–99)

## 2021-09-08 MED ORDER — DOCUSATE SODIUM 100 MG PO CAPS
100.0000 mg | ORAL_CAPSULE | Freq: Two times a day (BID) | ORAL | Status: DC
  Administered 2021-09-08 – 2021-09-19 (×22): 100 mg via ORAL
  Filled 2021-09-08 (×22): qty 1

## 2021-09-08 MED ORDER — POLYETHYLENE GLYCOL 3350 17 G PO PACK
17.0000 g | PACK | Freq: Every day | ORAL | Status: DC
  Administered 2021-09-08 – 2021-09-19 (×10): 17 g via ORAL
  Filled 2021-09-08 (×10): qty 1

## 2021-09-08 MED ORDER — INSULIN ASPART 100 UNIT/ML IJ SOLN
3.0000 [IU] | Freq: Three times a day (TID) | INTRAMUSCULAR | Status: DC
  Administered 2021-09-08 – 2021-09-11 (×6): 3 [IU] via SUBCUTANEOUS

## 2021-09-08 NOTE — Progress Notes (Signed)
PROGRESS NOTE  Austin [name] [id number] DOB: 1955-11-19 DOA: 09/05/2021 PCP: Theotis Burrow, MD   LOS: 2 days   Brief narrative:  Austin [name] is a 65 y.o. male with past medical history of coronary artery disease, diabetes mellitus type 2, history of DVT, GERD, hyperlipidemia, hypertension and peripheral vascular disease who presented to the hospital with progressive swelling redness of the left middle finger with her left hand and edema for 5 to 6 days.  Patient had initially developed an ulcer on the left middle fingertip without any obvious trauma and symptoms continued to get worse.  In the ED, patient had mild leukocytosis.  Hemoglobin A1c was 12.2.  Lactic acid was elevated at 2.2.  Blood glucose level elevated at 461.  X-ray of the left hand did not show any fracture or dislocation but soft tissue swelling.  Patient was mildly tachycardic.  Patient received Ringer lactate bolus vancomycin  and ceftriaxone in the ED and patient was admitted hospital for further evaluation and treatment.  Assessment/Plan:  Principal Problem:   Infection of left hand Active Problems:   Essential hypertension   Hyperlipidemia   Type 2 diabetes mellitus with diabetic peripheral angiopathy without gangrene (HCC)   Coronary artery disease   Lactic acidosis   Peripheral vascular disease (HCC)   Pressure injury of coccygeal region, stage 1  Left long finger infection with cellulitis of the left dorsal hand. Patient was seen by plastic surgery and underwent incision and drainage.  Surgery recommends twice daily dressing.  On vancomycin and Rocephin.  Culture from wound shows group A strep at this time.  Will discontinue vancomycin.  Continue IV antibiotics with Rocephin for now.  Continue to monitor.  Will prescribe sling to facilitate elevation of the arm.    Essential hypertension Continue losartan.  Discontinue IV fluids today.    Hyperlipidemia Continue Lipitor.     Diabetes  mellitus type 2, with hyperglycemia. Latest hemoglobin A1c of 12.2.  Continue sliding scale insulin, diabetic diet, Accu-Cheks.  We will add mealtime insulin as well.    Coronary artery disease Continue aspirin, ARB and statin.     Pressure injury of coccygeal region, stage 1 POA Continue prevention protocol.  DVT prophylaxis:  apixaban (ELIQUIS) tablet 5 mg   Disposition:  Home when okay with orthopedics.  Follow surgical culture and sensitivity..  Code Status: Full code  Family Communication:  I did speak with the patient at bedside  Status is: Inpatient  Remains inpatient appropriate because: IV antibiotics, status post surgical intervention    Consultants: Plastic surgery  Procedures: I&D of the left long finger  Anti-infectives:  Vancomycin >dced today and Rocephin IV  Anti-infectives (From admission, onward)    Start     Dose/Rate Route Frequency Ordered Stop   09/07/21 0900  cefTRIAXone (ROCEPHIN) 2 g in sodium chloride 0.9 % 100 mL IVPB        2 g 200 mL/hr over 30 Minutes Intravenous Every 24 hours 09/06/21 1716     09/07/21 0630  cefTRIAXone (ROCEPHIN) 2 g in sodium chloride 0.9 % 100 mL IVPB  Status:  Discontinued        2 g 200 mL/hr over 30 Minutes Intravenous  Once 09/06/21 1709 09/06/21 1716   09/06/21 2000  vancomycin (VANCOREADY) IVPB 1750 mg/350 mL  Status:  Discontinued        1,750 mg 175 mL/hr over 120 Minutes Intravenous Every 12 hours 09/06/21 0635 09/08/21 0936   09/06/21 0630  cefTRIAXone (  ROCEPHIN) 2 g in sodium chloride 0.9 % 100 mL IVPB        2 g 200 mL/hr over 30 Minutes Intravenous  Once 09/06/21 0617 09/06/21 0921   09/06/21 0630  vancomycin (VANCOREADY) IVPB 2000 mg/400 mL        2,000 mg 200 mL/hr over 120 Minutes Intravenous  Once 09/06/21 0622 09/06/21 1120       Subjective: Today, patient was seen and examined at bedside.  Complains of mild left hand pain 4/10.  Denies any fever or chills.    Objective: Vitals:    09/08/21 0606 09/08/21 0733  BP: 134/70 (!) 153/81  Pulse: 84 82  Resp: 18 18  Temp: 98.4 F (36.9 C) 97.9 F (36.6 C)  SpO2: 95% 97%    Intake/Output Summary (Last 24 hours) at 09/08/2021 1019 Last data filed at 09/08/2021 0600 Gross per 24 hour  Intake 1960.8 ml  Output 4300 ml  Net -2339.2 ml    Filed Weights   09/06/21 0607 09/06/21 0744  Weight: 111.1 kg 110.7 kg   Body mass index is 29.7 kg/m.   Physical Exam:  GENERAL: Patient is alert awake and oriented. Not in obvious distress. HENT: No scleral pallor or icterus. Pupils equally reactive to light. Oral mucosa is moist NECK: is supple, no gross swelling noted. CHEST: Clear to auscultation. No crackles or wheezes.  Diminished breath sounds bilaterally. CVS: S1 and S2 heard, no murmur. Regular rate and rhythm.  ABDOMEN: Soft, non-tender, bowel sounds are present. EXTREMITIES: Dorsal left hand with edema, long finger with is dressing.  Left below-knee amputation.  Right lower extremity with discoloration and ulcer.   CNS: Cranial nerves are intact. No focal motor deficits. SKIN: warm and dry, right lower extremity discoloration, wound.  Data Review: I have personally reviewed the following laboratory data and studies,  CBC: Recent Labs  Lab 09/06/21 0600 09/07/21 0344  WBC 12.6* 12.8*  HGB 13.8 12.6*  HCT 42.2 38.0*  MCV 87.0 84.8  PLT 248 258    Basic Metabolic Panel: Recent Labs  Lab 09/06/21 0600 09/07/21 0344  NA 130* 134*  K 4.3 4.0  CL 98 103  CO2 24 24  GLUCOSE 461* 261*  BUN 14 12  CREATININE 0.94 0.83  CALCIUM 8.9 8.6*    Liver Function Tests: Recent Labs  Lab 09/07/21 0344  AST 14*  ALT 15  ALKPHOS 68  BILITOT 0.8  PROT 7.0  ALBUMIN 2.6*    No results for input(s): LIPASE, AMYLASE in the last 168 hours. No results for input(s): AMMONIA in the last 168 hours. Cardiac Enzymes: No results for input(s): CKTOTAL, CKMB, CKMBINDEX, TROPONINI in the last 168 hours. BNP (last 3  results) Recent Labs    03/30/21 0958  BNP 67.6     ProBNP (last 3 results) No results for input(s): PROBNP in the last 8760 hours.  CBG: Recent Labs  Lab 09/07/21 0748 09/07/21 1150 09/07/21 1733 09/07/21 2202 09/08/21 0731  GLUCAP 238* 344* 264* 435* 339*    Recent Results (from the past 240 hour(s))  Resp Panel by RT-PCR (Flu A&B, Covid) Nasopharyngeal Swab     Status: None   Collection Time: 09/06/21  6:16 AM   Specimen: Nasopharyngeal Swab; Nasopharyngeal(NP) swabs in vial transport medium  Result Value Ref Range Status   SARS Coronavirus 2 by RT PCR NEGATIVE NEGATIVE Final    Comment: (NOTE) SARS-CoV-2 target nucleic acids are NOT DETECTED.  The SARS-CoV-2 RNA is generally detectable in upper  respiratory specimens during the acute phase of infection. The lowest concentration of SARS-CoV-2 viral copies this assay can detect is 138 copies/mL. A negative result does not preclude SARS-Cov-2 infection and should not be used as the sole basis for treatment or other patient management decisions. A negative result may occur with  improper specimen collection/handling, submission of specimen other than nasopharyngeal swab, presence of viral mutation(s) within the areas targeted by this assay, and inadequate number of viral copies(<138 copies/mL). A negative result must be combined with clinical observations, patient history, and epidemiological information. The expected result is Negative.  Fact Sheet for Patients:  EntrepreneurPulse.com.au  Fact Sheet for Healthcare Providers:  IncredibleEmployment.be  This test is no t yet approved or cleared by the [place] FDA and  has been authorized for detection and/or diagnosis of SARS-CoV-2 by FDA under an Emergency Use Authorization (EUA). This EUA will remain  in effect (meaning this test can be used) for the duration of the COVID-19 declaration under Section 564(b)(1) of the Act,  21 U.S.C.section 360bbb-3(b)(1), unless the authorization is terminated  or revoked sooner.       Influenza A by PCR NEGATIVE NEGATIVE Final   Influenza B by PCR NEGATIVE NEGATIVE Final    Comment: (NOTE) The Xpert Xpress SARS-CoV-2/FLU/RSV plus assay is intended as an aid in the diagnosis of influenza from Nasopharyngeal swab specimens and should not be used as a sole basis for treatment. Nasal washings and aspirates are unacceptable for Xpert Xpress SARS-CoV-2/FLU/RSV testing.  Fact Sheet for Patients: EntrepreneurPulse.com.au  Fact Sheet for Healthcare Providers: IncredibleEmployment.be  This test is not yet approved or cleared by the [place] FDA and has been authorized for detection and/or diagnosis of SARS-CoV-2 by FDA under an Emergency Use Authorization (EUA). This EUA will remain in effect (meaning this test can be used) for the duration of the COVID-19 declaration under Section 564(b)(1) of the Act, 21 U.S.C. section 360bbb-3(b)(1), unless the authorization is terminated or revoked.  Performed at [facility], [place] [address], [place], [place] [address]   Aerobic Culture w Gram Stain (superficial specimen)     Status: None (Preliminary result)   Collection Time: 09/06/21  8:56 AM   Specimen: Wound  Result Value Ref Range Status   Specimen Description   Final    WOUND LT FINGER Performed at [place] [address] [place], [place] [address]    Special Requests   Final    Normal Performed at [facility], [place] [address] [place], [place] [address]    Gram Stain   Final    MODERATE WBC PRESENT,BOTH PMN AND MONONUCLEAR ABUNDANT GRAM POSITIVE COCCI ABUNDANT GRAM NEGATIVE RODS FEW GRAM VARIABLE ROD    Culture   Final    ABUNDANT GROUP B STREP(S.AGALACTIAE)ISOLATED TESTING AGAINST S. AGALACTIAE NOT ROUTINELY PERFORMED DUE TO PREDICTABILITY OF AMP/PEN/VAN  SUSCEPTIBILITY. CULTURE REINCUBATED FOR BETTER GROWTH Performed at [facility], [place] [address] [place], [place] [address]    Report Status PENDING  Incomplete      Studies: No results found.    Flora Lipps, MD  [facility] 09/08/2021  If 7PM-7AM, please contact night-coverage

## 2021-09-08 NOTE — Progress Notes (Signed)
Orthopedic Tech Progress Note Patient Details:  Austin [name] [birth year] [id number]  Patient ID: [name], male   DOB: 07/13/1956, 65 y.o.   MRN: [id number]  [name] 09/08/2021, 2:52 PMapply shoulder sling

## 2021-09-08 NOTE — Progress Notes (Signed)
Orthopedic Tech Progress Note Patient Details:  Austin [name] 04-09-1956 [id number]  Ortho Devices Type of Ortho Device: Shoulder immobilizer Ortho Device/Splint Location: left Ortho Device/Splint Interventions: Application   Post Interventions Patient Tolerated: Well Instructions Provided: Care of device  [name] 09/08/2021, 10:01 AM

## 2021-09-08 NOTE — Plan of Care (Signed)
  Problem: Education: Goal: Knowledge of General Education information will improve Description Including pain rating scale, medication(s)/side effects and non-pharmacologic comfort measures Outcome: Progressing   Problem: Health Behavior/Discharge Planning: Goal: Ability to manage health-related needs will improve Outcome: Progressing   

## 2021-09-09 DIAGNOSIS — E782 Mixed hyperlipidemia: Secondary | ICD-10-CM | POA: Diagnosis not present

## 2021-09-09 DIAGNOSIS — I10 Essential (primary) hypertension: Secondary | ICD-10-CM | POA: Diagnosis not present

## 2021-09-09 DIAGNOSIS — E872 Acidosis, unspecified: Secondary | ICD-10-CM | POA: Diagnosis not present

## 2021-09-09 DIAGNOSIS — L089 Local infection of the skin and subcutaneous tissue, unspecified: Secondary | ICD-10-CM | POA: Diagnosis not present

## 2021-09-09 LAB — CBC
HCT: 40 % (ref 39.0–52.0)
Hemoglobin: 13.6 g/dL (ref 13.0–17.0)
MCH: 28.2 pg (ref 26.0–34.0)
MCHC: 34 g/dL (ref 30.0–36.0)
MCV: 83 fL (ref 80.0–100.0)
Platelets: 263 10*3/uL (ref 150–400)
RBC: 4.82 MIL/uL (ref 4.22–5.81)
RDW: 13.4 % (ref 11.5–15.5)
WBC: 13.3 10*3/uL — ABNORMAL HIGH (ref 4.0–10.5)
nRBC: 0 % (ref 0.0–0.2)

## 2021-09-09 LAB — BASIC METABOLIC PANEL
Anion gap: 7 (ref 5–15)
BUN: 10 mg/dL (ref 8–23)
CO2: 23 mmol/L (ref 22–32)
Calcium: 8.5 mg/dL — ABNORMAL LOW (ref 8.9–10.3)
Chloride: 101 mmol/L (ref 98–111)
Creatinine, Ser: 0.75 mg/dL (ref 0.61–1.24)
GFR, Estimated: >60 mL/min (ref >60)
Glucose, Bld: 263 mg/dL — ABNORMAL HIGH (ref 70–99)
Potassium: 4.1 mmol/L (ref 3.5–5.1)
Sodium: 131 mmol/L — ABNORMAL LOW (ref 135–145)

## 2021-09-09 LAB — GLUCOSE, CAPILLARY
Glucose-Capillary: 264 mg/dL — ABNORMAL HIGH (ref 70–99)
Glucose-Capillary: 306 mg/dL — ABNORMAL HIGH (ref 70–99)
Glucose-Capillary: 290 mg/dL — ABNORMAL HIGH (ref 70–99)
Glucose-Capillary: 301 mg/dL — ABNORMAL HIGH (ref 70–99)

## 2021-09-09 LAB — AEROBIC CULTURE W GRAM STAIN (SUPERFICIAL SPECIMEN): Special Requests: NORMAL

## 2021-09-09 LAB — MAGNESIUM: Magnesium: 1.8 mg/dL (ref 1.7–2.4)

## 2021-09-09 MED ORDER — CEFAZOLIN SODIUM-DEXTROSE 1-4 GM/50ML-% IV SOLN
1.0000 g | Freq: Three times a day (TID) | INTRAVENOUS | Status: DC
  Administered 2021-09-10 – 2021-09-17 (×22): 1 g via INTRAVENOUS
  Filled 2021-09-09 (×24): qty 50

## 2021-09-09 NOTE — Plan of Care (Signed)
  Problem: Nutrition: Goal: Adequate nutrition will be maintained Outcome: Progressing   

## 2021-09-09 NOTE — Progress Notes (Signed)
Attempted to do wound care on patient for the second time today. Patient was eating dinner at this time.

## 2021-09-09 NOTE — Progress Notes (Signed)
PROGRESS NOTE  Austin [name] HKV:[id number] DOB: 1956/10/01 DOA: 09/05/2021 PCP: Preston Fleeting, MD   LOS: 3 days   Brief narrative:  Austin [name] is a 65 y.o. male with past medical history of coronary artery disease, diabetes mellitus type 2, history of DVT, GERD, hyperlipidemia, hypertension and peripheral vascular disease who presented to the hospital with progressive swelling redness of the left middle finger with left hand edema for 5 to 6 days.  Patient had initially developed an ulcer on the left middle fingertip without any obvious trauma and symptoms continued to get worse.  In the ED, patient had mild leukocytosis.  Hemoglobin A1c was 12.2.  Lactic acid was elevated at 2.2.  Blood glucose level elevated at 461.  X-ray of the left hand did not show any fracture or dislocation but soft tissue swelling.  Patient was mildly tachycardic.  Patient received Ringer lactate bolus, vancomycin  and ceftriaxone in the ED and was admitted to the hospital for further evaluation and treatment.  Assessment/Plan:  Principal Problem:   Infection of left hand Active Problems:   Essential hypertension   Hyperlipidemia   Type 2 diabetes mellitus with diabetic peripheral angiopathy without gangrene (HCC)   Coronary artery disease   Lactic acidosis   Peripheral vascular disease (HCC)   Pressure injury of coccygeal region, stage 1  Left long finger infection with cellulitis of the left dorsal hand. Patient was seen by plastic surgery and underwent incision and drainage at bedside.  Surgery recommends twice daily dressing.  Was initially on vancomycin and Rocephin.  Culture from wound shows group A strep at this time. Will continue with Rocephin for now. Continue sling.  Surgery plans to follow-up on 09/10/2021.    Essential hypertension Continue losartan.  Vitals relatively stable.    Hyperlipidemia Continue Lipitor.     Diabetes mellitus type 2, with hyperglycemia. Latest  hemoglobin A1c of 12.2.  Continue basal insulin, sliding scale insulin, diabetic diet, Accu-Cheks.  Added mealtime insulin since yesterday for adequate control.    Coronary artery disease Continue aspirin, ARB and statin.     Pressure injury of coccygeal region, stage 1 POA Continue prevention protocol.  DVT prophylaxis:  apixaban (ELIQUIS) tablet 5 mg   Disposition:  Home when okay with orthopedics in 1 to 2 days.  Follow surgical culture and sensitivity.  Code Status: Full code  Family Communication:  Spoke with the patient at bedside.  Status is: Inpatient  Remains inpatient appropriate because: IV antibiotics, status post surgical intervention    Consultants: Plastic surgery  Procedures: I&D of the left long finger  Anti-infectives:  Rocephin IV  Anti-infectives (From admission, onward)    Start     Dose/Rate Route Frequency Ordered Stop   09/07/21 0900  cefTRIAXone (ROCEPHIN) 2 g in sodium chloride 0.9 % 100 mL IVPB        2 g 200 mL/hr over 30 Minutes Intravenous Every 24 hours 09/06/21 1716     09/07/21 0630  cefTRIAXone (ROCEPHIN) 2 g in sodium chloride 0.9 % 100 mL IVPB  Status:  Discontinued        2 g 200 mL/hr over 30 Minutes Intravenous  Once 09/06/21 1709 09/06/21 1716   09/06/21 2000  vancomycin (VANCOREADY) IVPB 1750 mg/350 mL  Status:  Discontinued        1,750 mg 175 mL/hr over 120 Minutes Intravenous Every 12 hours 09/06/21 0635 09/08/21 0936   09/06/21 0630  cefTRIAXone (ROCEPHIN) 2 g in sodium chloride 0.9 %  100 mL IVPB        2 g 200 mL/hr over 30 Minutes Intravenous  Once 09/06/21 0617 09/06/21 0921   09/06/21 0630  vancomycin (VANCOREADY) IVPB 2000 mg/400 mL        2,000 mg 200 mL/hr over 120 Minutes Intravenous  Once 09/06/21 0622 09/06/21 1120       Subjective: Today, patient was seen and examined at bedside.  Complains of mild pain in the hand but better than yesterday.  Denies any nausea vomiting fever or chills.  Has not had a bowel  movement.  Objective: Vitals:   09/08/21 2010 09/09/21 0513  BP: (!) 125/106 (!) 141/88  Pulse: 94 100  Resp: 18 18  Temp: 98 F (36.7 C) 98.3 F (36.8 C)  SpO2: 98% 95%    Intake/Output Summary (Last 24 hours) at 09/09/2021 0732 Last data filed at 09/09/2021 0724 Gross per 24 hour  Intake 1732.76 ml  Output 2050 ml  Net -317.24 ml    Filed Weights   09/06/21 0607 09/06/21 0744  Weight: 111.1 kg 110.7 kg   Body mass index is 29.7 kg/m.   Physical Exam:  General:  Average built, not in obvious distress HENT:   No scleral pallor or icterus noted. Oral mucosa is moist.  Chest:  Clear breath sounds.  Diminished breath sounds bilaterally. No crackles or wheezes.  CVS: S1 &S2 heard. No murmur.  Regular rate and rhythm. Abdomen: Soft, nontender, nondistended.  Bowel sounds are heard.   Extremities: No cyanosis, clubbing or edema.  Peripheral pulses are palpable.  Dorsal left hand with edema, long finger with dressing, left below-knee amputation, right lower extremity with discoloration and ulcer Psych: Alert, awake and oriented, normal mood CNS:  No cranial nerve deficits.  Power equal in all extremities.   Skin: Warm and dry.  Left finger with dressing.  Data Review: I have personally reviewed the following laboratory data and studies,  CBC: Recent Labs  Lab 09/06/21 0600 09/07/21 0344 09/09/21 0330  WBC 12.6* 12.8* 13.3*  HGB 13.8 12.6* 13.6  HCT 42.2 38.0* 40.0  MCV 87.0 84.8 83.0  PLT 248 258 [id number]    Basic Metabolic Panel: Recent Labs  Lab 09/06/21 0600 09/07/21 0344 09/09/21 0330  NA 130* 134* 131*  K 4.3 4.0 4.1  CL 98 103 101  CO2 24 24 23   GLUCOSE 461* 261* 263*  BUN 14 12 10   CREATININE 0.94 0.83 0.75  CALCIUM 8.9 8.6* 8.5*  MG  --   --  1.8    Liver Function Tests: Recent Labs  Lab 09/07/21 0344  AST 14*  ALT 15  ALKPHOS 68  BILITOT 0.8  PROT 7.0  ALBUMIN 2.6*    No results for input(s): LIPASE, AMYLASE in the last 168 hours. No  results for input(s): AMMONIA in the last 168 hours. Cardiac Enzymes: No results for input(s): CKTOTAL, CKMB, CKMBINDEX, TROPONINI in the last 168 hours. BNP (last 3 results) Recent Labs    03/30/21 0958  BNP 67.6     ProBNP (last 3 results) No results for input(s): PROBNP in the last 8760 hours.  CBG: Recent Labs  Lab 09/08/21 0731 09/08/21 1212 09/08/21 1614 09/08/21 2246 09/09/21 0720  GLUCAP 339* 329* 371* 300* 264*    Recent Results (from the past 240 hour(s))  Resp Panel by RT-PCR (Flu A&B, Covid) Nasopharyngeal Swab     Status: None   Collection Time: 09/06/21  6:16 AM   Specimen: Nasopharyngeal Swab; Nasopharyngeal(NP) swabs  in vial transport medium  Result Value Ref Range Status   SARS Coronavirus 2 by RT PCR NEGATIVE NEGATIVE Final    Comment: (NOTE) SARS-CoV-2 target nucleic acids are NOT DETECTED.  The SARS-CoV-2 RNA is generally detectable in upper respiratory specimens during the acute phase of infection. The lowest concentration of SARS-CoV-2 viral copies this assay can detect is 138 copies/mL. A negative result does not preclude SARS-Cov-2 infection and should not be used as the sole basis for treatment or other patient management decisions. A negative result may occur with  improper specimen collection/handling, submission of specimen other than nasopharyngeal swab, presence of viral mutation(s) within the areas targeted by this assay, and inadequate number of viral copies(<138 copies/mL). A negative result must be combined with clinical observations, patient history, and epidemiological information. The expected result is Negative.  Fact Sheet for Patients:  EntrepreneurPulse.com.au  Fact Sheet for Healthcare Providers:  IncredibleEmployment.be  This test is no t yet approved or cleared by the [place] FDA and  has been authorized for detection and/or diagnosis of SARS-CoV-2 by FDA under an Emergency Use  Authorization (EUA). This EUA will remain  in effect (meaning this test can be used) for the duration of the COVID-19 declaration under Section 564(b)(1) of the Act, 21 U.S.C.section 360bbb-3(b)(1), unless the authorization is terminated  or revoked sooner.       Influenza A by PCR NEGATIVE NEGATIVE Final   Influenza B by PCR NEGATIVE NEGATIVE Final    Comment: (NOTE) The Xpert Xpress SARS-CoV-2/FLU/RSV plus assay is intended as an aid in the diagnosis of influenza from Nasopharyngeal swab specimens and should not be used as a sole basis for treatment. Nasal washings and aspirates are unacceptable for Xpert Xpress SARS-CoV-2/FLU/RSV testing.  Fact Sheet for Patients: EntrepreneurPulse.com.au  Fact Sheet for Healthcare Providers: IncredibleEmployment.be  This test is not yet approved or cleared by the [place] FDA and has been authorized for detection and/or diagnosis of SARS-CoV-2 by FDA under an Emergency Use Authorization (EUA). This EUA will remain in effect (meaning this test can be used) for the duration of the COVID-19 declaration under Section 564(b)(1) of the Act, 21 U.S.C. section 360bbb-3(b)(1), unless the authorization is terminated or revoked.  Performed at [facility], [place] [address] [place], [place] [address]   Aerobic Culture w Gram Stain (superficial specimen)     Status: None (Preliminary result)   Collection Time: 09/06/21  8:56 AM   Specimen: Wound  Result Value Ref Range Status   Specimen Description   Final    WOUND LT FINGER Performed at [facility] [address] [place], [place] [address]    Special Requests   Final    Normal Performed at [facility], [place] [address] [place],  [address]    Gram Stain   Final    MODERATE WBC PRESENT,BOTH PMN AND MONONUCLEAR ABUNDANT GRAM POSITIVE COCCI ABUNDANT GRAM NEGATIVE RODS FEW GRAM VARIABLE ROD     Culture   Final    ABUNDANT STREPTOCOCCUS AGALACTIAE TESTING AGAINST S. AGALACTIAE NOT ROUTINELY PERFORMED DUE TO PREDICTABILITY OF AMP/PEN/VAN SUSCEPTIBILITY. RARE STAPHYLOCOCCUS AUREUS SUSCEPTIBILITIES TO FOLLOW Performed at [facility], [place] [address] [place],  [address]    Report Status PENDING  Incomplete      Studies: No results found.    Flora Lipps, MD  [facility] 09/09/2021  If 7PM-7AM, please contact night-coverage

## 2021-09-09 NOTE — Plan of Care (Signed)
  Problem: Education: Goal: Knowledge of General Education information will improve Description: Including pain rating scale, medication(s)/side effects and non-pharmacologic comfort measures Outcome: Progressing   Problem: Pain Managment: Goal: General experience of comfort will improve Outcome: Progressing   Problem: Skin Integrity: Goal: Risk for impaired skin integrity will decrease Outcome: Progressing   

## 2021-09-09 NOTE — Progress Notes (Signed)
Patient had an "assisted fall" at the end of shift. Patient's primary RN was assisting another patient when this incident happened. Primary RN was not notified that assistance was needed in the patient's room when the patient was being assisted with toileting. Primary RN was notified that assisted fall occurred after being done helping another patient. Patient's primary RN notified Dr. Rebekah Chesterfield, MD (day time hospitalist) and [facility] floor coverage Audrea Muscat, NP at 7:20 PM. Both Dr. Rebekah Chesterfield, MD and Audrea Muscat, NP messaged primary RN back saying that they received the message that patient had assisted fall. Patient is alert and oriented and no injuries noted after assisted fall.  New orders from Dr. Tyson Babinski, MD: PT evaluation. Patient will continue to be monitored.

## 2021-09-09 NOTE — Progress Notes (Signed)
Patient's sister (Deloris Margarette Asal) was contacted at the 7 PM hour and made aware that patient had a fall. Left a voicemail and callback number. Told patient's sister to call back the unit to let us know she has received the message. Patient's son was also contacted, but phone call did not go through.

## 2021-09-10 ENCOUNTER — Inpatient Hospital Stay (HOSPITAL_COMMUNITY): Payer: 59 | Admitting: Anesthesiology

## 2021-09-10 ENCOUNTER — Ambulatory Visit (INDEPENDENT_AMBULATORY_CARE_PROVIDER_SITE_OTHER): Payer: 59 | Admitting: Nurse Practitioner

## 2021-09-10 ENCOUNTER — Encounter (HOSPITAL_COMMUNITY)
Admission: EM | Disposition: A | Discharge status: Skilled Nursing Facility | Payer: Self-pay | Source: Home / Self Care | Attending: Family Medicine

## 2021-09-10 ENCOUNTER — Encounter (INDEPENDENT_AMBULATORY_CARE_PROVIDER_SITE_OTHER): Payer: Self-pay

## 2021-09-10 ENCOUNTER — Encounter (HOSPITAL_COMMUNITY): Payer: Self-pay | Admitting: Family Medicine

## 2021-09-10 DIAGNOSIS — I10 Essential (primary) hypertension: Secondary | ICD-10-CM | POA: Diagnosis not present

## 2021-09-10 DIAGNOSIS — E872 Acidosis, unspecified: Secondary | ICD-10-CM | POA: Diagnosis not present

## 2021-09-10 DIAGNOSIS — L089 Local infection of the skin and subcutaneous tissue, unspecified: Secondary | ICD-10-CM | POA: Diagnosis not present

## 2021-09-10 DIAGNOSIS — E782 Mixed hyperlipidemia: Secondary | ICD-10-CM | POA: Diagnosis not present

## 2021-09-10 DIAGNOSIS — L03012 Cellulitis of left finger: Secondary | ICD-10-CM

## 2021-09-10 HISTORY — PX: MINOR IRRIGATION AND DEBRIDEMENT OF WOUND: SHX6239

## 2021-09-10 HISTORY — PX: AMPUTATION: SHX166

## 2021-09-10 LAB — GLUCOSE, CAPILLARY
Glucose-Capillary: 289 mg/dL — ABNORMAL HIGH (ref 70–99)
Glucose-Capillary: 257 mg/dL — ABNORMAL HIGH (ref 70–99)
Glucose-Capillary: 223 mg/dL — ABNORMAL HIGH (ref 70–99)
Glucose-Capillary: 216 mg/dL — ABNORMAL HIGH (ref 70–99)
Glucose-Capillary: 219 mg/dL — ABNORMAL HIGH (ref 70–99)

## 2021-09-10 LAB — BASIC METABOLIC PANEL
Anion gap: 8 (ref 5–15)
BUN: 9 mg/dL (ref 8–23)
CO2: 23 mmol/L (ref 22–32)
Calcium: 8.8 mg/dL — ABNORMAL LOW (ref 8.9–10.3)
Chloride: 99 mmol/L (ref 98–111)
Creatinine, Ser: 0.86 mg/dL (ref 0.61–1.24)
GFR, Estimated: >60 mL/min (ref >60)
Glucose, Bld: 276 mg/dL — ABNORMAL HIGH (ref 70–99)
Potassium: 4.7 mmol/L (ref 3.5–5.1)
Sodium: 130 mmol/L — ABNORMAL LOW (ref 135–145)

## 2021-09-10 LAB — CBC
HCT: 42.8 % (ref 39.0–52.0)
Hemoglobin: 14.7 g/dL (ref 13.0–17.0)
MCH: 28.4 pg (ref 26.0–34.0)
MCHC: 34.3 g/dL (ref 30.0–36.0)
MCV: 82.8 fL (ref 80.0–100.0)
Platelets: 268 10*3/uL (ref 150–400)
RBC: 5.17 MIL/uL (ref 4.22–5.81)
RDW: 13.7 % (ref 11.5–15.5)
WBC: 16.7 10*3/uL — ABNORMAL HIGH (ref 4.0–10.5)
nRBC: 0 % (ref 0.0–0.2)

## 2021-09-10 SURGERY — AMPUTATION DIGIT
Anesthesia: Monitor Anesthesia Care | Site: Finger | Laterality: Left

## 2021-09-10 MED ORDER — BUPIVACAINE-EPINEPHRINE (PF) 0.25% -1:200000 IJ SOLN
INTRAMUSCULAR | Status: DC | PRN
  Administered 2021-09-10: 10 mL

## 2021-09-10 MED ORDER — ACETAMINOPHEN 10 MG/ML IV SOLN: 1000.0000 mg | Freq: Once | INTRAVENOUS | Status: DC | PRN

## 2021-09-10 MED ORDER — PROPOFOL 1000 MG/100ML IV EMUL
INTRAVENOUS | Status: AC
  Filled 2021-09-10: qty 100

## 2021-09-10 MED ORDER — MIDAZOLAM HCL 2 MG/2ML IJ SOLN
INTRAMUSCULAR | Status: AC
  Filled 2021-09-10: qty 2

## 2021-09-10 MED ORDER — LACTATED RINGERS IV SOLN: INTRAVENOUS | Status: DC

## 2021-09-10 MED ORDER — FENTANYL CITRATE PF 50 MCG/ML IJ SOSY
50.0000 ug | PREFILLED_SYRINGE | INTRAMUSCULAR | Status: DC
  Administered 2021-09-10: 50 ug via INTRAVENOUS

## 2021-09-10 MED ORDER — IRRISEPT - 450ML BOTTLE WITH 0.05% CHG IN STERILE WATER, USP 99.95% OPTIME
TOPICAL | Status: DC | PRN
  Administered 2021-09-10: 450 mL

## 2021-09-10 MED ORDER — INSULIN ASPART 100 UNIT/ML IJ SOLN
INTRAMUSCULAR | Status: AC
  Filled 2021-09-10: qty 1

## 2021-09-10 MED ORDER — PROPOFOL 500 MG/50ML IV EMUL
INTRAVENOUS | Status: DC | PRN
Start: 2021-09-10 — End: 2021-09-10
  Administered 2021-09-10: 30 ug/kg/min via INTRAVENOUS

## 2021-09-10 MED ORDER — FENTANYL CITRATE PF 50 MCG/ML IJ SOSY: 25.0000 ug | PREFILLED_SYRINGE | INTRAMUSCULAR | Status: DC | PRN

## 2021-09-10 MED ORDER — CHLORHEXIDINE GLUCONATE 0.12 % MT SOLN
15.0000 mL | Freq: Once | OROMUCOSAL | Status: AC
  Administered 2021-09-10: 15 mL via OROMUCOSAL

## 2021-09-10 MED ORDER — CHLORHEXIDINE GLUCONATE CLOTH 2 % EX PADS
6.0000 | MEDICATED_PAD | Freq: Once | CUTANEOUS | Status: AC
  Administered 2021-09-10: 6 via TOPICAL

## 2021-09-10 MED ORDER — FENTANYL CITRATE PF 50 MCG/ML IJ SOSY
PREFILLED_SYRINGE | INTRAMUSCULAR | Status: AC
  Filled 2021-09-10: qty 1

## 2021-09-10 MED ORDER — HYDRALAZINE HCL 20 MG/ML IJ SOLN: 10.0000 mg | Freq: Four times a day (QID) | INTRAMUSCULAR | Status: DC | PRN

## 2021-09-10 MED ORDER — CHLORHEXIDINE GLUCONATE CLOTH 2 % EX PADS: 6.0000 | MEDICATED_PAD | Freq: Once | CUTANEOUS | Status: AC

## 2021-09-10 MED ORDER — 0.9 % SODIUM CHLORIDE (POUR BTL) OPTIME
TOPICAL | Status: DC | PRN
  Administered 2021-09-10: 1000 mL

## 2021-09-10 MED ORDER — CEFAZOLIN SODIUM-DEXTROSE 2-4 GM/100ML-% IV SOLN: 2.0000 g | INTRAVENOUS | Status: DC

## 2021-09-10 MED ORDER — BUPIVACAINE-EPINEPHRINE (PF) 0.25% -1:200000 IJ SOLN
INTRAMUSCULAR | Status: AC
  Filled 2021-09-10: qty 30

## 2021-09-10 MED ORDER — BUPIVACAINE HCL (PF) 0.25 % IJ SOLN
INTRAMUSCULAR | Status: AC
  Filled 2021-09-10: qty 30

## 2021-09-10 MED ORDER — CLONIDINE HCL (ANALGESIA) 100 MCG/ML EP SOLN
EPIDURAL | Status: DC | PRN
  Administered 2021-09-10: 50 ug

## 2021-09-10 MED ORDER — MIDAZOLAM HCL 2 MG/2ML IJ SOLN
INTRAMUSCULAR | Status: DC | PRN
  Administered 2021-09-10 (×2): 1 mg via INTRAVENOUS

## 2021-09-10 MED ORDER — FENTANYL CITRATE (PF) 100 MCG/2ML IJ SOLN
INTRAMUSCULAR | Status: AC
  Filled 2021-09-10: qty 2

## 2021-09-10 MED ORDER — ROPIVACAINE HCL 5 MG/ML IJ SOLN
INTRAMUSCULAR | Status: DC | PRN
  Administered 2021-09-10: 20 mL via PERINEURAL

## 2021-09-10 MED ORDER — PROPOFOL 10 MG/ML IV BOLUS
INTRAVENOUS | Status: DC | PRN
  Administered 2021-09-10: 20 mg via INTRAVENOUS
  Administered 2021-09-10: 30 mg via INTRAVENOUS

## 2021-09-10 SURGICAL SUPPLY — 48 items
BAG COUNTER SPONGE SURGICOUNT (BAG) ×2 IMPLANT
BNDG COHESIVE 1X5 TAN STRL LF (GAUZE/BANDAGES/DRESSINGS) ×2 IMPLANT
BNDG CONFORM 2 STRL LF (GAUZE/BANDAGES/DRESSINGS) ×1 IMPLANT
BNDG ELASTIC 2X5.8 VLCR STR LF (GAUZE/BANDAGES/DRESSINGS) ×2 IMPLANT
BNDG ELASTIC 3X5.8 VLCR STR LF (GAUZE/BANDAGES/DRESSINGS) ×1 IMPLANT
BNDG ELASTIC 4X5.8 VLCR STR LF (GAUZE/BANDAGES/DRESSINGS) IMPLANT
BNDG GAUZE ELAST 4 BULKY (GAUZE/BANDAGES/DRESSINGS) ×2 IMPLANT
CNTNR URN SCR LID CUP LEK RST (MISCELLANEOUS) ×1 IMPLANT
CONT SPEC 4OZ STRL OR WHT (MISCELLANEOUS) ×2
CORD BIPOLAR FORCEPS 12FT (ELECTRODE) ×2 IMPLANT
COVER SURGICAL LIGHT HANDLE (MISCELLANEOUS) ×2 IMPLANT
CUFF TOURN SGL QUICK 18X4 (TOURNIQUET CUFF) ×2 IMPLANT
CUFF TOURN SGL QUICK 24 (TOURNIQUET CUFF)
CUFF TRNQT CYL 24X4X16.5-23 (TOURNIQUET CUFF) IMPLANT
DRAPE SURG 17X23 STRL (DRAPES) ×2 IMPLANT
DRSG ADAPTIC 3X8 NADH LF (GAUZE/BANDAGES/DRESSINGS) ×2 IMPLANT
GAUZE SPONGE 2X2 8PLY STRL LF (GAUZE/BANDAGES/DRESSINGS) IMPLANT
GAUZE SPONGE 4X4 12PLY STRL (GAUZE/BANDAGES/DRESSINGS) ×1 IMPLANT
GAUZE XEROFORM 1X8 LF (GAUZE/BANDAGES/DRESSINGS) ×1 IMPLANT
GLOVE SURG ORTHO LTX SZ8 (GLOVE) ×2 IMPLANT
GLOVE SURG UNDER POLY LF SZ8.5 (GLOVE) ×2 IMPLANT
GOWN STRL REUS W/ TWL LRG LVL3 (GOWN DISPOSABLE) ×2 IMPLANT
GOWN STRL REUS W/ TWL XL LVL3 (GOWN DISPOSABLE) ×1 IMPLANT
GOWN STRL REUS W/TWL LRG LVL3 (GOWN DISPOSABLE) ×4
GOWN STRL REUS W/TWL XL LVL3 (GOWN DISPOSABLE) ×2
JET LAVAGE IRRISEPT WOUND (IRRIGATION / IRRIGATOR) ×2
KIT BASIN OR (CUSTOM PROCEDURE TRAY) ×2 IMPLANT
LAVAGE JET IRRISEPT WOUND (IRRIGATION / IRRIGATOR) IMPLANT
MANIFOLD NEPTUNE II (INSTRUMENTS) ×2 IMPLANT
NDL HYPO 25X1 1.5 SAFETY (NEEDLE) IMPLANT
NEEDLE HYPO 25X1 1.5 SAFETY (NEEDLE) IMPLANT
NS IRRIG 1000ML POUR BTL (IV SOLUTION) ×2 IMPLANT
PACK ORTHO EXTREMITY (CUSTOM PROCEDURE TRAY) ×2 IMPLANT
PAD ARMBOARD 7.5X6 YLW CONV (MISCELLANEOUS) ×4 IMPLANT
PAD CAST 4YDX4 CTTN HI CHSV (CAST SUPPLIES) IMPLANT
PADDING CAST COTTON 4X4 STRL (CAST SUPPLIES)
SOAP 2 % CHG 4 OZ (WOUND CARE) ×2 IMPLANT
SPONGE GAUZE 2X2 STER 10/PKG (GAUZE/BANDAGES/DRESSINGS)
SUCTION FRAZIER HANDLE 10FR (MISCELLANEOUS)
SUCTION TUBE FRAZIER 10FR DISP (MISCELLANEOUS) IMPLANT
SUT CHROMIC 4 0 PS 2 18 (SUTURE) ×2 IMPLANT
SUT MERSILENE 4 0 P 3 (SUTURE) IMPLANT
SUT PROLENE 4 0 PS 2 18 (SUTURE) IMPLANT
SYR CONTROL 10ML LL (SYRINGE) IMPLANT
TOWEL OR 17X26 10 PK STRL BLUE (TOWEL DISPOSABLE) ×2 IMPLANT
TOWEL OR NON WOVEN STRL DISP B (DISPOSABLE) ×2 IMPLANT
TUBING CONNECTING 10 (TUBING) IMPLANT
WATER STERILE IRR 1000ML POUR (IV SOLUTION) ×1 IMPLANT

## 2021-09-10 NOTE — Anesthesia Preprocedure Evaluation (Signed)
Anesthesia Evaluation  Patient identified by MRN, date of birth, ID band Patient awake    Reviewed: Allergy & Precautions, NPO status , Patient's Chart, lab work & pertinent test results  Airway Mallampati: I  TM Distance: >3 FB Neck ROM: Full    Dental no notable dental hx.    Pulmonary neg pulmonary ROS, former smoker,    Pulmonary exam normal        Cardiovascular hypertension, Pt. on medications + CAD, + Peripheral Vascular Disease and + DVT   Rhythm:Regular Rate:Normal     Neuro/Psych negative neurological ROS  negative psych ROS   GI/Hepatic Neg liver ROS, GERD  ,  Endo/Other  diabetes, Type 2, Insulin Dependent  Renal/GU   negative genitourinary   Musculoskeletal Left long finger injection   Abdominal Normal abdominal exam  (+)   Peds  Hematology negative hematology ROS (+)   Anesthesia Other Findings   Reproductive/Obstetrics                            Anesthesia Physical Anesthesia Plan  ASA: 3  Anesthesia Plan: MAC and Regional   Post-op Pain Management: Regional block   Induction: Intravenous  PONV Risk Score and Plan: 1 and Ondansetron, Dexamethasone and Treatment may vary due to age or medical condition  Airway Management Planned: Simple Face Mask, Natural Airway and Nasal Cannula  Additional Equipment: None  Intra-op Plan:   Post-operative Plan:   Informed Consent: I have reviewed the patients History and Physical, chart, labs and discussed the procedure including the risks, benefits and alternatives for the proposed anesthesia with the patient or authorized representative who has indicated his/her understanding and acceptance.     Dental advisory given  Plan Discussed with: CRNA  Anesthesia Plan Comments: (Lab Results      Component                Value               Date                      WBC                      16.7 (H)            09/10/2021                 HGB                      14.7                09/10/2021                HCT                      42.8                09/10/2021                MCV                      82.8                09/10/2021                PLT  268                 09/10/2021           Lab Results      Component                Value               Date                      NA                       130 (L)             09/10/2021                K                        4.7                 09/10/2021                CO2                      23                  09/10/2021                GLUCOSE                  276 (H)             09/10/2021                BUN                      9                   09/10/2021                CREATININE               0.86                09/10/2021                CALCIUM                  8.8 (L)             09/10/2021                GFRNONAA                 >60                 09/10/2021          )        Anesthesia Quick Evaluation

## 2021-09-10 NOTE — Progress Notes (Signed)
Spoke to [place], Charity fundraiser and received report for surgery scheduled for 1710 today.

## 2021-09-10 NOTE — Evaluation (Signed)
Physical Therapy Evaluation Patient Details Name: Austin [name] MRN: [id number] DOB: 12-15-1955 Today's Date: 09/10/2021  History of Present Illness  65 y.o. male admitted to hospital with progressive swelling redness of the left middle finger with left hand edema. X-ray of the left hand did not show any fracture or dislocation but soft tissue swelling pt is s/p L long finger I&D on 12/2 at bedside, to return to OR for second I&D 12/5   PMH: coronary artery disease, diabetes mellitus type 2, history of DVT, GERD, hyperlipidemia, hypertension and peripheral vascular disease, L BKA  Clinical Impression  Pt admitted with above diagnosis.  Pt oriented to self only this date during PT session. Difficulty following commands and requires max assist for bed mobility. Unless pt clears cognitively and/or has incr assist at home, he will likely need SNF post acute. Pt  has the primo-fit in place, has not been OOB in 4-5 days at time of PT eval, he is significantly deconditioned.   Pt currently with functional limitations due to the deficits listed below (see PT Problem List). Pt will benefit from skilled PT to increase their independence and safety with mobility to allow discharge to the venue listed below.          Recommendations for follow up therapy are one component of a multi-disciplinary discharge planning process, led by the attending physician.  Recommendations may be updated based on patient status, additional functional criteria and insurance authorization.  Follow Up Recommendations Skilled nursing-short term rehab (<3 hours/day)    Assistance Recommended at Discharge Frequent or constant Supervision/Assistance  Functional Status Assessment Patient has had a recent decline in their functional status and demonstrates the ability to make significant improvements in function in a reasonable and predictable amount of time.  Equipment Recommendations  None recommended by PT    Recommendations  for Other Services       Precautions / Restrictions Precautions Precautions: Fall Restrictions Weight Bearing Restrictions: No      Mobility  Bed Mobility Overal bed mobility: Needs Assistance Bed Mobility: Rolling;Sidelying to Sit;Sit to Supine Rolling: Mod assist Sidelying to sit: Max assist   Sit to supine: Mod assist;Max assist   General bed mobility comments: incr time and effort, assist to elevate trunk, bed bad utilized to bring L hip forward. assist with trunk to upright and LEs off bed. assist to lift LEs on to bed; multi-modal cues for sequencing and self assist    Transfers                   General transfer comment: NT with +1 assist    Ambulation/Gait                  Stairs            Wheelchair Mobility    Modified Rankin (Stroke Patients Only)       Balance Overall balance assessment: Needs assistance Sitting-balance support: Bilateral upper extremity supported (RLE supported, L BKA) Sitting balance-Leahy Scale: Poor Sitting balance - Comments: briefly able to maintain sitting with close supervision, overall min to mod assist to hold midline Postural control: Posterior lean;Right lateral lean     Standing balance comment: NT                             Pertinent Vitals/Pain Pain Assessment: No/denies pain    Home Living Family/patient expects to be discharged to:: Private residence Living Arrangements: Alone  Available Help at Discharge: Available PRN/intermittently;Friend(s) Type of Home: House Home Access: Stairs to enter Entrance Stairs-Rails: None Entrance Stairs-Number of Steps: 2   Home Layout: One level Home Equipment: Agricultural consultant (2 wheels);Wheelchair - manual Additional Comments: pt is not a a reliable historian today, some info taken from previous adm    Prior Function Prior Level of Function : Patient poor historian/Family not available                     Hand Dominance         Extremity/Trunk Assessment   Upper Extremity Assessment Upper Extremity Assessment: Defer to OT evaluation    Lower Extremity Assessment Lower Extremity Assessment: Generalized weakness;LLE deficits/detail;Difficult to assess due to impaired cognition RLE Deficits / Details: AAROM grossly WFL LLE Deficits / Details: old L BKA, AROM grossly WFL       Communication   Communication: No difficulties  Cognition Arousal/Alertness: Awake/alert;Lethargic (initially lethargic, then after pt aroused and worked with PT he became restless, removing clothing, trying to sit up on his own) Behavior During Therapy: Restless Overall Cognitive Status: Impaired/Different from baseline Area of Impairment: Orientation;Following commands;Problem solving                 Orientation Level: Disoriented to;Place;Situation;Time     Following Commands: Follows one step commands inconsistently;Follows multi-step commands inconsistently     Problem Solving: Slow processing;Decreased initiation;Difficulty sequencing;Requires verbal cues;Requires tactile cues          General Comments      Exercises     Assessment/Plan    PT Assessment Patient needs continued PT services  PT Problem List Decreased strength;Decreased mobility;Decreased activity tolerance;Decreased balance;Decreased knowledge of use of DME;Decreased cognition       PT Treatment Interventions DME instruction;Therapeutic activities;Functional mobility training;Therapeutic exercise;Patient/family education;Balance training    PT Goals (Current goals can be found in the Care Plan section)  Acute Rehab PT Goals PT Goal Formulation: Patient unable to participate in goal setting Time For Goal Achievement: 09/24/21 Potential to Achieve Goals: Good    Frequency Min 2X/week   Barriers to discharge        Co-evaluation               AM-PAC PT "6 Clicks" Mobility  Outcome Measure Help needed turning from your back to  your side while in a flat bed without using bedrails?: A Lot Help needed moving from lying on your back to sitting on the side of a flat bed without using bedrails?: A Lot Help needed moving to and from a bed to a chair (including a wheelchair)?: Total Help needed standing up from a chair using your arms (e.g., wheelchair or bedside chair)?: Total Help needed to walk in hospital room?: Total Help needed climbing 3-5 steps with a railing? : Total 6 Click Score: 8    End of Session   Activity Tolerance: Patient limited by fatigue Patient left: in bed;with call bell/phone within reach;with bed alarm set Nurse Communication: Mobility status PT Visit Diagnosis: Other abnormalities of gait and mobility (R26.89)    Time: 0944-1000 PT Time Calculation (min) (ACUTE ONLY): 16 min   Charges:   PT Evaluation $PT Eval Low Complexity: 1 Low          Seena Face, PT  Acute Rehab Dept ([facility]) [phone] Pager [phone]  09/10/2021   [facility] 09/10/2021, 10:56 AM

## 2021-09-10 NOTE — Transfer of Care (Signed)
Immediate Anesthesia Transfer of Care Note  Patient: Ivar D Ginyard  Procedure(s) Performed: AMPUTATION DIGIT (Left: Finger) IRRIGATION AND DEBRIDEMENT OF LEFT LONG FINGER WOUND (Left: Finger)  Patient Location: PACU  Anesthesia Type:MAC combined with regional for post-op pain  Level of Consciousness: awake, alert , oriented and patient cooperative  Airway & Oxygen Therapy: Patient Spontanous Breathing  Post-op Assessment: Report given to RN and Post -op Vital signs reviewed and stable  Post vital signs: Reviewed and stable  Last Vitals:  Vitals Value Taken Time  BP 125/79 09/10/21 1739  Temp    Pulse 105 09/10/21 1741  Resp 35 09/10/21 1741  SpO2 94 % 09/10/21 1741  Vitals shown include unvalidated device data.  Last Pain:  Vitals:   09/10/21 1611  TempSrc:   PainSc: 0-No pain      Patients Stated Pain Goal: 0 (66/06/30 1601)  Complications: No notable events documented.

## 2021-09-10 NOTE — Progress Notes (Signed)
Inpatient Diabetes Program Recommendations  AACE/ADA: New Consensus Statement on Inpatient Glycemic Control (2015)  Target Ranges:  Prepandial:   less than 140 mg/dL      Peak postprandial:   less than 180 mg/dL (1-2 hours)      Critically ill patients:  140 - 180 mg/dL   Lab Results  Component Value Date   GLUCAP 257 (H) 09/10/2021   HGBA1C 12.8 (H) 09/07/2021    Review of Glycemic Control  Blood sugars still above goal of 140-180 mg/dL. Needs tight glycemic control for healing  Current orders for Inpatient glycemic control: Semglee 30 units QHS, Novolog 0-20 units TID with meals + 3 units TID  Inpatient Diabetes Program Recommendations:    Increase Semglee to 35 units QHS Add Novolog 0-5 for HS correction Increase Novolog to 5 units TID with meals  Will continue to follow glucose trends.  Thank you. Ailene Ards, RD, LDN, CDE Inpatient Diabetes Coordinator [phone]

## 2021-09-10 NOTE — Op Note (Signed)
Operative Note   DATE OF OPERATION: 09/10/2021  SURGICAL DEPARTMENT: Plastic Surgery  PREOPERATIVE DIAGNOSES: Left long finger infection with necrosis  POSTOPERATIVE DIAGNOSES:  same  PROCEDURE: 1.  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  Incision and drainage of the flexor tendon sheath left long finger  SURGEON: Ancil Linseyollier Stephens Kristan Brummitt, MD  ASSISTANT: Zadie CleverlyMatt Scheeler, PA The advanced practice practitioner (APP) assisted throughout the case.  The APP was essential in retraction and counter traction when needed to make the case progress smoothly.  This retraction and assistance made it possible to see the tissue planes for the procedure.  The assistance was needed for hemostasis, tissue re-approximation and closure of the incision site.   ANESTHESIA:  General.   COMPLICATIONS: None.   INDICATIONS FOR PROCEDURE:  The patient, Austin [name] is a 65 y.o. male born on 05/06/1956, is here for treatment of left long finger infection.  This was a distal fingertip infection that was drained in the emergency room previously.  It seemed like he was developing progressive necrosis of the skin envelope distal to the PIP joint.  His white count continued to go up.  For these reasons we elected to take him for amputation of the PIP joint along with exploration of the flexor tendon sheath.  He is fully aware of the risks and benefits of the procedure. MRN: [id number]  CONSENT:  Informed consent was obtained directly from the patient. Risks, benefits and alternatives were fully discussed. Specific risks including but not limited to bleeding, infection, hematoma, seroma, scarring, pain, contracture, asymmetry, wound healing problems, and need for further surgery were all discussed. The patient did have an ample opportunity to have questions answered to satisfaction.   DESCRIPTION OF PROCEDURE:  The patient was taken to the operating room. SCDs were placed and antibiotics were given.  Regional  anesthesia was administered.  [name] operative site was prepped and draped in a sterile fashion. A time out was performed and all information was confirmed to be correct.  Started by evaluating the finger.  There seem to be progressive necrosis of the skin distal to the PIP joint.  I elected to try out a fishmouth incision just distal to the PIP joint.  The arm was exsanguinated with gravity and tourniquet inflated to 250 mmHg.  I incised along the fishmouth incision and encountered purulence in the subcutaneous tissues.  The flexor sheath was identified and the flexor tendons appeared healthy with no purulence within the sheath itself.  I then dissected back to the PIP joint and sharply incised the pericapsular ligaments to amputate the finger at that level.  I then elected to perform a mid axial incision along the ulnar aspect of the finger and extended this back to the level of the A1 pulley in a [place] fashion.  The soft tissues back in the palm proper appeared healthy.  No further purulence was encountered.  I did milk the flexor sheath and there did not appear to be any purulence tracking in that direction.  I did open the flexor sheath along its entirety and no further purulence was encountered.  Dorsally there is no fluctuance and no signs of further purulence in that area either.  At this point I debrided the distal aspect of the proximal phalanx with a rongeur to shorten its slightly further to avoid any tension on the skin closure.  A rasp was used to file down any sharp areas.  I did remove a small piece of tissue  distally and this was sent as a tissue culture.  Entire wound was then irrigated copiously with Irrisept followed by saline.  At this point loose closure was done with interrupted 4-0 chromic sutures.  Soft dressing was applied.  Tourniquet was let down.  Tourniquet time was 28 minutes.  The patient tolerated the procedure well.  There were no complications. The patient was allowed to  wake from anesthesia, extubated and taken to the recovery room in satisfactory condition.

## 2021-09-10 NOTE — Interval H&P Note (Signed)
History and Physical Interval Note:  09/10/2021 3:55 PM  Austin [name]  has presented today for surgery, with the diagnosis of infection left long finger.  The various methods of treatment have been discussed with the patient and family. After consideration of risks, benefits and other options for treatment, the patient has consented to  Procedure(s): AMPUTATION DIGIT (Left) IRRIGATION AND DEBRIDEMENT OF LEFT LONG FINGER WOUND (Left) as a surgical intervention.  [name] history has been reviewed, patient examined, no change in status, stable for surgery.  I have reviewed the patient's chart and labs.  Questions were answered to the patient's satisfaction.     Austin [name]

## 2021-09-10 NOTE — Progress Notes (Signed)
PROGRESS NOTE  Austin [name] [id number] DOB: March 21, 1956 DOA: 09/05/2021 PCP: Theotis Burrow, MD   LOS: 4 days   Brief narrative:  Austin [name] is a 65 y.o. male with past medical history of coronary artery disease, diabetes mellitus type 2, history of DVT, GERD, hyperlipidemia, hypertension and peripheral vascular disease who presented to the hospital with progressive swelling redness of the left middle finger with left hand edema for 5 to 6 days.  Patient had initially developed an ulcer on the left middle fingertip without any obvious trauma and symptoms continued to get worse.  In the ED, patient had mild leukocytosis.  Hemoglobin A1c was 12.2.  Lactic acid was elevated at 2.2.  Blood glucose level elevated at 461.  X-ray of the left hand did not show any fracture or dislocation but soft tissue swelling.  Patient was mildly tachycardic.  Patient received Ringer lactate bolus, vancomycin  and ceftriaxone in the ED and was admitted to the hospital for further evaluation and treatment.  Assessment/Plan:  Principal Problem:   Infection of left hand Active Problems:   Essential hypertension   Hyperlipidemia   Type 2 diabetes mellitus with diabetic peripheral angiopathy without gangrene (HCC)   Coronary artery disease   Lactic acidosis   Peripheral vascular disease (HCC)   Pressure injury of coccygeal region, stage 1  Left long finger infection with cellulitis of the left dorsal hand. Patient was seen by plastic surgery and underwent incision and drainage at bedside.   Was initially on vancomycin and Rocephin.  Culture from wound shows rare staph aureus.  Antibiotics has been now changed to Ancef.  Planning for repeat debridement today as per surgery since patient  with significant leukocytosis.  Temperature max of 100.3 F.    Essential hypertension On losartan.   We will add as needed hydralazine.    Hyperlipidemia Continue Lipitor.     Diabetes mellitus type 2,  with hyperglycemia. Latest hemoglobin A1c of 12.2.  Continue basal insulin, sliding scale insulin, diabetic diet, Accu-Cheks.  Added mealtime insulin for adequate glycemic control.  Currently NPO.  We will closely monitor     Coronary artery disease Continue aspirin, ARB and statin.     Pressure injury of coccygeal region, stage 1 POA Continue prevention protocol.  DVT prophylaxis:  apixaban (ELIQUIS) tablet 5 mg   Disposition:  Home when okay with orthopedics in 1 to 2 days.  Follow surgical culture and sensitivity.  Plan for repeat debridement today.  Code Status: Full code  Family Communication:  I tried to reach the patient's sister on the phone but was unable to reach her.  Status is: Inpatient  Remains inpatient appropriate because: IV antibiotics, status post surgical intervention, need for repeat debridement.  Consultants: Plastic surgery  Procedures: I&D of the left long finger at bedside on 09/06/2021.  Anti-infectives: Ancef IV  Anti-infectives (From admission, onward)    Start     Dose/Rate Route Frequency Ordered Stop   09/10/21 0600  ceFAZolin (ANCEF) IVPB 1 g/50 mL premix        1 g 100 mL/hr over 30 Minutes Intravenous Every 8 hours 09/09/21 1342     09/07/21 0900  cefTRIAXone (ROCEPHIN) 2 g in sodium chloride 0.9 % 100 mL IVPB  Status:  Discontinued        2 g 200 mL/hr over 30 Minutes Intravenous Every 24 hours 09/06/21 1716 09/09/21 1342   09/07/21 0630  cefTRIAXone (ROCEPHIN) 2 g in sodium chloride 0.9 % 100  mL IVPB  Status:  Discontinued        2 g 200 mL/hr over 30 Minutes Intravenous  Once 09/06/21 1709 09/06/21 1716   09/06/21 2000  vancomycin (VANCOREADY) IVPB 1750 mg/350 mL  Status:  Discontinued        1,750 mg 175 mL/hr over 120 Minutes Intravenous Every 12 hours 09/06/21 0635 09/08/21 0936   09/06/21 0630  cefTRIAXone (ROCEPHIN) 2 g in sodium chloride 0.9 % 100 mL IVPB        2 g 200 mL/hr over 30 Minutes Intravenous  Once 09/06/21 0617  09/06/21 0921   09/06/21 0630  vancomycin (VANCOREADY) IVPB 2000 mg/400 mL        2,000 mg 200 mL/hr over 120 Minutes Intravenous  Once 09/06/21 0622 09/06/21 1120       Subjective: Today, patient was seen and examined at bedside.  Complains of mild pain over the dorsal hand.  Denies any fever chills or rigor.  Constipated.  Objective: Vitals:   09/09/21 2111 09/10/21 0538  BP: (!) 171/89 (!) 160/91  Pulse: (!) 104 (!) 106  Resp: 16 16  Temp: 99 F (37.2 C) 98.7 F (37.1 C)  SpO2: 96% 98%    Intake/Output Summary (Last 24 hours) at 09/10/2021 0955 Last data filed at 09/10/2021 0600 Gross per 24 hour  Intake 301.65 ml  Output 1950 ml  Net -1648.35 ml    Filed Weights   09/06/21 0607 09/06/21 0744  Weight: 111.1 kg 110.7 kg   Body mass index is 29.7 kg/m.   Physical Exam:  General:  Average built, not in obvious distress HENT:   No scleral pallor or icterus noted. Oral mucosa is moist.  Chest:  Clear breath sounds.  Diminished breath sounds bilaterally. No crackles or wheezes.  CVS: S1 &S2 heard. No murmur.  Regular rate and rhythm. Abdomen: Soft, nontender, nondistended.  Bowel sounds are heard.   Extremities: No cyanosis, clubbing or edema.  Dorsal left hand with edema and long finger with Ace dressing, left below-knee amputation.  Right lower extremity with discoloration.   Psych: Alert, awake and oriented,  CNS:  No cranial nerve deficits.  Moves extremities. Skin: Warm and dry.  Left hand with Ace bandages  Data Review: I have personally reviewed the following laboratory data and studies,  CBC: Recent Labs  Lab 09/06/21 0600 09/07/21 0344 09/09/21 0330 09/10/21 0526  WBC 12.6* 12.8* 13.3* 16.7*  HGB 13.8 12.6* 13.6 14.7  HCT 42.2 38.0* 40.0 42.8  MCV 87.0 84.8 83.0 82.8  PLT 248 258 263 [id number]    Basic Metabolic Panel: Recent Labs  Lab 09/06/21 0600 09/07/21 0344 09/09/21 0330 09/10/21 0526  NA 130* 134* 131* 130*  K 4.3 4.0 4.1 4.7  CL 98 103  101 99  CO2 24 24 23 23   GLUCOSE 461* 261* 263* 276*  BUN 14 12 10 9   CREATININE 0.94 0.83 0.75 0.86  CALCIUM 8.9 8.6* 8.5* 8.8*  MG  --   --  1.8  --     Liver Function Tests: Recent Labs  Lab 09/07/21 0344  AST 14*  ALT 15  ALKPHOS 68  BILITOT 0.8  PROT 7.0  ALBUMIN 2.6*    No results for input(s): LIPASE, AMYLASE in the last 168 hours. No results for input(s): AMMONIA in the last 168 hours. Cardiac Enzymes: No results for input(s): CKTOTAL, CKMB, CKMBINDEX, TROPONINI in the last 168 hours. BNP (last 3 results) Recent Labs    03/30/21 [phone]  BNP 67.6     ProBNP (last 3 results) No results for input(s): PROBNP in the last 8760 hours.  CBG: Recent Labs  Lab 09/09/21 0720 09/09/21 1126 09/09/21 1644 09/09/21 2112 09/10/21 0724  GLUCAP 264* 306* 290* 301* 289*    Recent Results (from the past 240 hour(s))  Resp Panel by RT-PCR (Flu A&B, Covid) Nasopharyngeal Swab     Status: None   Collection Time: 09/06/21  6:16 AM   Specimen: Nasopharyngeal Swab; Nasopharyngeal(NP) swabs in vial transport medium  Result Value Ref Range Status   SARS Coronavirus 2 by RT PCR NEGATIVE NEGATIVE Final    Comment: (NOTE) SARS-CoV-2 target nucleic acids are NOT DETECTED.  The SARS-CoV-2 RNA is generally detectable in upper respiratory specimens during the acute phase of infection. The lowest concentration of SARS-CoV-2 viral copies this assay can detect is 138 copies/mL. A negative result does not preclude SARS-Cov-2 infection and should not be used as the sole basis for treatment or other patient management decisions. A negative result may occur with  improper specimen collection/handling, submission of specimen other than nasopharyngeal swab, presence of viral mutation(s) within the areas targeted by this assay, and inadequate number of viral copies(<138 copies/mL). A negative result must be combined with clinical observations, patient history, and  epidemiological information. The expected result is Negative.  Fact Sheet for Patients:  BloggerCourse.com  Fact Sheet for Healthcare Providers:  SeriousBroker.it  This test is no t yet approved or cleared by the [place] FDA and  has been authorized for detection and/or diagnosis of SARS-CoV-2 by FDA under an Emergency Use Authorization (EUA). This EUA will remain  in effect (meaning this test can be used) for the duration of the COVID-19 declaration under Section 564(b)(1) of the Act, 21 U.S.C.section 360bbb-3(b)(1), unless the authorization is terminated  or revoked sooner.       Influenza A by PCR NEGATIVE NEGATIVE Final   Influenza B by PCR NEGATIVE NEGATIVE Final    Comment: (NOTE) The Xpert Xpress SARS-CoV-2/FLU/RSV plus assay is intended as an aid in the diagnosis of influenza from Nasopharyngeal swab specimens and should not be used as a sole basis for treatment. Nasal washings and aspirates are unacceptable for Xpert Xpress SARS-CoV-2/FLU/RSV testing.  Fact Sheet for Patients: BloggerCourse.com  Fact Sheet for Healthcare Providers: SeriousBroker.it  This test is not yet approved or cleared by the [place] FDA and has been authorized for detection and/or diagnosis of SARS-CoV-2 by FDA under an Emergency Use Authorization (EUA). This EUA will remain in effect (meaning this test can be used) for the duration of the COVID-19 declaration under Section 564(b)(1) of the Act, 21 U.S.C. section 360bbb-3(b)(1), unless the authorization is terminated or revoked.  Performed at [facility], [address], [place], [place] [address]   Aerobic Culture w Gram Stain (superficial specimen)     Status: None   Collection Time: 09/06/21  8:56 AM   Specimen: Wound  Result Value Ref Range Status   Specimen Description   Final    WOUND LT  FINGER Performed at [facility], [address] [place], [place] [address]    Special Requests   Final    Normal Performed at [facility], [address], [place], [place] [address]    Gram Stain   Final    MODERATE WBC PRESENT,BOTH PMN AND MONONUCLEAR ABUNDANT GRAM POSITIVE COCCI ABUNDANT GRAM NEGATIVE RODS FEW GRAM VARIABLE ROD    Culture   Final  ABUNDANT STREPTOCOCCUS AGALACTIAE TESTING AGAINST S. AGALACTIAE NOT ROUTINELY PERFORMED DUE TO PREDICTABILITY OF AMP/PEN/VAN SUSCEPTIBILITY. RARE STAPHYLOCOCCUS AUREUS WITHIN MIXED ORGANISMS Performed at [facility], [address], [place], [place] [address]    Report Status 09/09/2021 FINAL  Final   Organism ID, Bacteria STAPHYLOCOCCUS AUREUS  Final      Susceptibility   Staphylococcus aureus - MIC*    CIPROFLOXACIN <=0.5 SENSITIVE Sensitive     ERYTHROMYCIN <=0.25 SENSITIVE Sensitive     GENTAMICIN <=0.5 SENSITIVE Sensitive     OXACILLIN <=0.25 SENSITIVE Sensitive     TETRACYCLINE <=1 SENSITIVE Sensitive     VANCOMYCIN <=0.5 SENSITIVE Sensitive     TRIMETH/SULFA <=10 SENSITIVE Sensitive     CLINDAMYCIN <=0.25 SENSITIVE Sensitive     RIFAMPIN <=0.5 SENSITIVE Sensitive     Inducible Clindamycin NEGATIVE Sensitive     * RARE STAPHYLOCOCCUS AUREUS      Studies: No results found.    Joycelyn Das, MD  [facility] 09/10/2021  If 7PM-7AM, please contact night-coverage

## 2021-09-10 NOTE — Anesthesia Postprocedure Evaluation (Signed)
Anesthesia Post Note  Patient: Kavon D Bastone  Procedure(s) Performed: AMPUTATION DIGIT (Left: Finger) IRRIGATION AND DEBRIDEMENT OF LEFT LONG FINGER WOUND (Left: Finger)     Patient location during evaluation: PACU Anesthesia Type: Regional and MAC Level of consciousness: awake and alert Pain management: pain level controlled Vital Signs Assessment: post-procedure vital signs reviewed and stable Respiratory status: spontaneous breathing, nonlabored ventilation, respiratory function stable and patient connected to nasal cannula oxygen Cardiovascular status: stable and blood pressure returned to baseline Postop Assessment: no apparent nausea or vomiting Anesthetic complications: no   No notable events documented.  Last Vitals:  Vitals:   09/10/21 1830 09/10/21 1850  BP: 132/73 109/72  Pulse: 90 89  Resp: 16 16  Temp: 37.3 C 36.8 C  SpO2: 94% 97%    Last Pain:  Vitals:   09/10/21 1850  TempSrc: Oral  PainSc:                  March Rummage Jeronica Stlouis

## 2021-09-10 NOTE — Progress Notes (Signed)
65 year old male status post left long finger amputation at the PIP joint and incision and drainage of the flexor tendon sheath with Dr. Arita Miss on 09/10/2021.  Recommend daily dressing changes to the left long finger stump with Xeroform followed by 2 x 2 gauze/4 x 4 gauze, Kerlix and Ace wrap.  Cultures were sent, will follow cultures.  We will continue to follow, plan to evaluate at end of week.

## 2021-09-10 NOTE — Progress Notes (Signed)
Assisted Dr. Greg Stoltzfus with left, ultrasound guided, supraclavicular block. Side rails up, monitors on throughout procedure. See vital signs in flow sheet. Tolerated Procedure well. 

## 2021-09-10 NOTE — Anesthesia Procedure Notes (Signed)
Anesthesia Regional Block: Supraclavicular block   Pre-Anesthetic Checklist: , timeout performed,  Correct Patient, Correct Site, Correct Laterality,  Correct Procedure, Correct Position, site marked,  Risks and benefits discussed,  Surgical consent,  Pre-op evaluation,  At surgeon's request and post-op pain management  Laterality: Left  Prep: Dura Prep       Needles:  Injection technique: Single-shot  Needle Type: Echogenic Stimulator Needle     Needle Length: 5cm  Needle Gauge: 20     Additional Needles:   Procedures:,,,, ultrasound used (permanent image in chart),,    Narrative:  Start time: 09/10/2021 4:09 PM End time: 09/10/2021 4:11 PM Injection made incrementally with aspirations every 5 mL.  Performed by: Personally  Anesthesiologist: Atilano Median, DO  Additional Notes: Patient identified. Risks/Benefits/Options discussed with patient including but not limited to bleeding, infection, nerve damage, failed block, incomplete pain control. Patient expressed understanding and wished to proceed. All questions were answered. Sterile technique was used throughout the entire procedure. Please see nursing notes for vital signs. Aspirated in 5cc intervals with injection for negative confirmation. Patient was given instructions on fall risk and not to get out of bed. All questions and concerns addressed with instructions to call with any issues or inadequate analgesia.

## 2021-09-10 NOTE — Progress Notes (Signed)
Day of Surgery  Subjective: Patient with left long finger infection.  This was drained at the bedside by Dr. Arita Miss on 09/06/2021, he reports improvement in pain today.  He denies any fevers or chills.  Objective: Vital signs in last 24 hours: Temp:  [97.7 F (36.5 C)-100.3 F (37.9 C)] 98.7 F (37.1 C) (12/05 0538) Pulse Rate:  [89-106] 106 (12/05 0538) Resp:  [14-20] 16 (12/05 0538) BP: (124-171)/(75-91) 160/91 (12/05 0538) SpO2:  [95 %-98 %] 98 % (12/05 0538) Last BM Date: 09/06/21  Intake/Output from previous day: 12/04 0701 - 12/05 0700 In: 541.7 [P.O.:300; I.V.:91.7; IV Piggyback:150] Out: 2450 [Urine:2450] Intake/Output this shift: Total I/O In: -  Out: 200 [Urine:200]  General appearance: alert, cooperative, and no distress Left hand: Palpable radial pulse.  Sensation intact.  Poor range of motion of left long finger due to pain and swelling.  Decreased tenderness noted over MCP joint and flexor tendons.  Necrosis noted along the distal portion of the long finger, no fluctuance is noted, this has softened up.  Scant amount of purulence is expressed with palpation.  No exposed bone or tendon.  Lab Results:  CBC Latest Ref Rng & Units 09/10/2021 09/09/2021 09/07/2021  WBC 4.0 - 10.5 K/uL 16.7(H) 13.3(H) 12.8(H)  Hemoglobin 13.0 - 17.0 g/dL 48.0 16.5 12.6(L)  Hematocrit 39.0 - 52.0 % 42.8 40.0 38.0(L)  Platelets 150 - 400 K/uL 268 263 258    BMET Recent Labs    09/09/21 0330 09/10/21 0526  NA 131* 130*  K 4.1 4.7  CL 101 99  CO2 23 23  GLUCOSE 263* 276*  BUN 10 9  CREATININE 0.75 0.86  CALCIUM 8.5* 8.8*   PT/INR No results for input(s): LABPROT, INR in the last 72 hours. ABG No results for input(s): PHART, HCO3 in the last 72 hours.  Invalid input(s): PCO2, PO2  Studies/Results: No results found.  Anti-infectives: Anti-infectives (From admission, onward)    Start     Dose/Rate Route Frequency Ordered Stop   09/10/21 0600  ceFAZolin (ANCEF) IVPB 1 g/50 mL  premix        1 g 100 mL/hr over 30 Minutes Intravenous Every 8 hours 09/09/21 1342     09/07/21 0900  cefTRIAXone (ROCEPHIN) 2 g in sodium chloride 0.9 % 100 mL IVPB  Status:  Discontinued        2 g 200 mL/hr over 30 Minutes Intravenous Every 24 hours 09/06/21 1716 09/09/21 1342   09/07/21 0630  cefTRIAXone (ROCEPHIN) 2 g in sodium chloride 0.9 % 100 mL IVPB  Status:  Discontinued        2 g 200 mL/hr over 30 Minutes Intravenous  Once 09/06/21 1709 09/06/21 1716   09/06/21 2000  vancomycin (VANCOREADY) IVPB 1750 mg/350 mL  Status:  Discontinued        1,750 mg 175 mL/hr over 120 Minutes Intravenous Every 12 hours 09/06/21 0635 09/08/21 0936   09/06/21 0630  cefTRIAXone (ROCEPHIN) 2 g in sodium chloride 0.9 % 100 mL IVPB        2 g 200 mL/hr over 30 Minutes Intravenous  Once 09/06/21 0617 09/06/21 0921   09/06/21 0630  vancomycin (VANCOREADY) IVPB 2000 mg/400 mL        2,000 mg 200 mL/hr over 120 Minutes Intravenous  Once 09/06/21 0622 09/06/21 1120       Assessment/Plan:  Left long finger infection:  Plan for irrigation and debridement and possible amputation of left long finger with Dr. Arita Miss this afternoon.  NPO. Dressing was changed this morning. Discussed surgery with patient, all of his questions were answered to his content.    LOS: 4 days    Charlies Constable, PA-C 09/10/2021

## 2021-09-11 ENCOUNTER — Encounter (INDEPENDENT_AMBULATORY_CARE_PROVIDER_SITE_OTHER): Payer: Self-pay

## 2021-09-11 ENCOUNTER — Encounter (HOSPITAL_COMMUNITY): Payer: Self-pay | Admitting: Plastic Surgery

## 2021-09-11 ENCOUNTER — Encounter (INDEPENDENT_AMBULATORY_CARE_PROVIDER_SITE_OTHER): Payer: 59

## 2021-09-11 ENCOUNTER — Ambulatory Visit (INDEPENDENT_AMBULATORY_CARE_PROVIDER_SITE_OTHER): Payer: 59 | Admitting: Nurse Practitioner

## 2021-09-11 LAB — BASIC METABOLIC PANEL
Anion gap: 8 (ref 5–15)
BUN: 13 mg/dL (ref 8–23)
CO2: 24 mmol/L (ref 22–32)
Calcium: 8.6 mg/dL — ABNORMAL LOW (ref 8.9–10.3)
Chloride: 98 mmol/L (ref 98–111)
Creatinine, Ser: 0.76 mg/dL (ref 0.61–1.24)
GFR, Estimated: >60 mL/min (ref >60)
Glucose, Bld: 282 mg/dL — ABNORMAL HIGH (ref 70–99)
Potassium: 4.2 mmol/L (ref 3.5–5.1)
Sodium: 130 mmol/L — ABNORMAL LOW (ref 135–145)

## 2021-09-11 LAB — GLUCOSE, CAPILLARY
Glucose-Capillary: 284 mg/dL — ABNORMAL HIGH (ref 70–99)
Glucose-Capillary: 303 mg/dL — ABNORMAL HIGH (ref 70–99)
Glucose-Capillary: 300 mg/dL — ABNORMAL HIGH (ref 70–99)
Glucose-Capillary: 305 mg/dL — ABNORMAL HIGH (ref 70–99)

## 2021-09-11 LAB — CBC
HCT: 38.3 % — ABNORMAL LOW (ref 39.0–52.0)
Hemoglobin: 12.8 g/dL — ABNORMAL LOW (ref 13.0–17.0)
MCH: 27.9 pg (ref 26.0–34.0)
MCHC: 33.4 g/dL (ref 30.0–36.0)
MCV: 83.4 fL (ref 80.0–100.0)
Platelets: 283 10*3/uL (ref 150–400)
RBC: 4.59 MIL/uL (ref 4.22–5.81)
RDW: 13.7 % (ref 11.5–15.5)
WBC: 16.3 10*3/uL — ABNORMAL HIGH (ref 4.0–10.5)
nRBC: 0 % (ref 0.0–0.2)

## 2021-09-11 LAB — MAGNESIUM: Magnesium: 2 mg/dL (ref 1.7–2.4)

## 2021-09-11 MED ORDER — INSULIN GLARGINE-YFGN 100 UNIT/ML ~~LOC~~ SOLN
35.0000 [IU] | Freq: Every day | SUBCUTANEOUS | Status: DC
  Administered 2021-09-11 – 2021-09-15 (×5): 35 [IU] via SUBCUTANEOUS
  Filled 2021-09-11 (×9): qty 0.35

## 2021-09-11 MED ORDER — INSULIN ASPART 100 UNIT/ML IJ SOLN
5.0000 [IU] | Freq: Three times a day (TID) | INTRAMUSCULAR | Status: DC
  Administered 2021-09-11 – 2021-09-19 (×24): 5 [IU] via SUBCUTANEOUS

## 2021-09-11 MED ORDER — INSULIN ASPART 100 UNIT/ML IJ SOLN
0.0000 [IU] | Freq: Every day | INTRAMUSCULAR | Status: DC
Start: 2021-09-11 — End: 2021-09-20
  Administered 2021-09-11 – 2021-09-12 (×2): 4 [IU] via SUBCUTANEOUS
  Administered 2021-09-13: 3 [IU] via SUBCUTANEOUS
  Administered 2021-09-15: 2 [IU] via SUBCUTANEOUS
  Administered 2021-09-16: 4 [IU] via SUBCUTANEOUS
  Administered 2021-09-17 – 2021-09-18 (×2): 3 [IU] via SUBCUTANEOUS

## 2021-09-11 NOTE — Progress Notes (Addendum)
PROGRESS NOTE  Austin [name] [id number] DOB: 24-Nov-1955 DOA: 09/05/2021 PCP: Theotis Burrow, MD   LOS: 5 days   Brief narrative:  Austin [name] is a 65 y.o. male with past medical history of coronary artery disease, diabetes mellitus type 2, history of DVT, GERD, hyperlipidemia, hypertension and peripheral vascular disease who presented to the hospital with progressive swelling redness of the left middle finger with left hand edema for 5 to 6 days.  Patient had initially developed an ulcer on the left middle fingertip without any obvious trauma and symptoms continued to get worse.  In the ED, patient had mild leukocytosis.  Hemoglobin A1c was 12.2.  Lactic acid was elevated at 2.2.  Blood glucose level elevated at 461.  X-ray of the left hand did not show any fracture or dislocation but soft tissue swelling.  Patient was mildly tachycardic.  Patient received Ringer lactate bolus, vancomycin  and ceftriaxone in the ED and was admitted to the hospital for further evaluation and treatment.  Assessment/Plan:  Principal Problem:   Infection of left hand Active Problems:   Essential hypertension   Hyperlipidemia   Type 2 diabetes mellitus with diabetic peripheral angiopathy without gangrene (HCC)   Coronary artery disease   Lactic acidosis   Peripheral vascular disease (HCC)   Pressure injury of coccygeal region, stage 1  Left long finger infection with cellulitis of the left dorsal hand. Patient was seen by plastic surgery and underwent incision and drainage at bedside in the ED.  Status post repeat left long finger amputation at the PIP joint and incision and drainage of the flexor tendon sheath on 09/10/2021.  Patient did have fever and rising leukocytosis yesterday.  Temperature max of 99.3 F today.  WBC at 16.3 from 16.7 we will continue to monitor.  Follow orthopedic recommendations.  Follow culture and sensitivity from yesterday.  Follow orthopedic recommendations on  discharge.    Essential hypertension On losartan.   Continue as needed hydralazine.    Hyperlipidemia Continue Lipitor.     Diabetes mellitus type 2, with hyperglycemia. Latest hemoglobin A1c of 12.2.  Diabetic coordinator on board.  Will adjust insulin.  On long-acting mealtime and sliding scale insulin at this time.    Coronary artery disease Continue aspirin, ARB and statin.     Pressure injury of coccygeal region, stage 1 POA Continue prevention protocol.  DVT prophylaxis:  apixaban (ELIQUIS) tablet 5 mg   Disposition:  Home when okay with orthopedics. Follow surgical culture and sensitivity.   Code Status: Full code  Family Communication:  I again tried to reach the patient's sister on the phone but was unable to reach her.  Status is: Inpatient  Remains inpatient appropriate because: IV antibiotics, status post surgical intervention, need for repeat debridement.  Consultants: Plastic surgery  Procedures: I&D of the left long finger at bedside on 09/06/2021. I&D on 09/10/2021  Anti-infectives: Ancef IV  Anti-infectives (From admission, onward)    Start     Dose/Rate Route Frequency Ordered Stop   09/10/21 1300  ceFAZolin (ANCEF) IVPB 2g/100 mL premix  Status:  Discontinued        2 g 200 mL/hr over 30 Minutes Intravenous On call to O.R. 09/10/21 1236 09/10/21 1843   09/10/21 0600  ceFAZolin (ANCEF) IVPB 1 g/50 mL premix        1 g 100 mL/hr over 30 Minutes Intravenous Every 8 hours 09/09/21 1342     09/07/21 0900  cefTRIAXone (ROCEPHIN) 2 g in sodium  chloride 0.9 % 100 mL IVPB  Status:  Discontinued        2 g 200 mL/hr over 30 Minutes Intravenous Every 24 hours 09/06/21 1716 09/09/21 1342   09/07/21 0630  cefTRIAXone (ROCEPHIN) 2 g in sodium chloride 0.9 % 100 mL IVPB  Status:  Discontinued        2 g 200 mL/hr over 30 Minutes Intravenous  Once 09/06/21 1709 09/06/21 1716   09/06/21 2000  vancomycin (VANCOREADY) IVPB 1750 mg/350 mL  Status:  Discontinued         1,750 mg 175 mL/hr over 120 Minutes Intravenous Every 12 hours 09/06/21 0635 09/08/21 0936   09/06/21 0630  cefTRIAXone (ROCEPHIN) 2 g in sodium chloride 0.9 % 100 mL IVPB        2 g 200 mL/hr over 30 Minutes Intravenous  Once 09/06/21 0617 09/06/21 0921   09/06/21 0630  vancomycin (VANCOREADY) IVPB 2000 mg/400 mL        2,000 mg 200 mL/hr over 120 Minutes Intravenous  Once 09/06/21 0622 09/06/21 1120      Subjective: Today, patient was seen and examined at bedside.  Complains of mild pain over the surgical site.  Denies any shortness of breath chest pain fever chills or rigor.   Objective: Vitals:   09/11/21 0249 09/11/21 0500  BP: (!) 143/99 (!) 153/81  Pulse: 68 100  Resp: 17 17  Temp: 98.3 F (36.8 C) 98.1 F (36.7 C)  SpO2: 96% 99%    Intake/Output Summary (Last 24 hours) at 09/11/2021 0959 Last data filed at 09/11/2021 0600 Gross per 24 hour  Intake 2261.62 ml  Output 1002 ml  Net 1259.62 ml    Filed Weights   09/06/21 0607 09/06/21 0744  Weight: 111.1 kg 110.7 kg   Body mass index is 29.7 kg/m.   Physical Exam:  General:  Average built, not in obvious distress HENT:   No scleral pallor or icterus noted. Oral mucosa is moist.  Chest:  Clear breath sounds.  Diminished breath sounds bilaterally. No crackles or wheezes.  CVS: S1 &S2 heard. No murmur.  Regular rate and rhythm. Abdomen: Soft, nontender, nondistended.  Bowel sounds are heard.   Extremities: Dorsal left hand with edema and long finger with Ace dressing.  Left below-knee amputation.  Right lower extremity with discoloration. Psych: Alert, awake and oriented, normal mood CNS:  No cranial nerve deficits.  Power equal in all extremities.   Skin: Warm and dry.  Left hand with Ace bandage.  Data Review: I have personally reviewed the following laboratory data and studies,  CBC: Recent Labs  Lab 09/06/21 0600 09/07/21 0344 09/09/21 0330 09/10/21 0526 09/11/21 0340  WBC 12.6* 12.8* 13.3*  16.7* 16.3*  HGB 13.8 12.6* 13.6 14.7 12.8*  HCT 42.2 38.0* 40.0 42.8 38.3*  MCV 87.0 84.8 83.0 82.8 83.4  PLT 248 258 263 268 [id number]    Basic Metabolic Panel: Recent Labs  Lab 09/06/21 0600 09/07/21 0344 09/09/21 0330 09/10/21 0526 09/11/21 0340  NA 130* 134* 131* 130* 130*  K 4.3 4.0 4.1 4.7 4.2  CL 98 103 101 99 98  CO2 24 24 23 23 24   GLUCOSE 461* 261* 263* 276* 282*  BUN 14 12 10 9 13   CREATININE 0.94 0.83 0.75 0.86 0.76  CALCIUM 8.9 8.6* 8.5* 8.8* 8.6*  MG  --   --  1.8  --  2.0    Liver Function Tests: Recent Labs  Lab 09/07/21 0344  AST 14*  ALT 15  ALKPHOS 68  BILITOT 0.8  PROT 7.0  ALBUMIN 2.6*    No results for input(s): LIPASE, AMYLASE in the last 168 hours. No results for input(s): AMMONIA in the last 168 hours. Cardiac Enzymes: No results for input(s): CKTOTAL, CKMB, CKMBINDEX, TROPONINI in the last 168 hours. BNP (last 3 results) Recent Labs    03/30/21 0958  BNP 67.6     ProBNP (last 3 results) No results for input(s): PROBNP in the last 8760 hours.  CBG: Recent Labs  Lab 09/10/21 1127 09/10/21 1541 09/10/21 1743 09/10/21 2121 09/11/21 0748  GLUCAP 257* 223* 216* 219* 284*    Recent Results (from the past 240 hour(s))  Resp Panel by RT-PCR (Flu A&B, Covid) Nasopharyngeal Swab     Status: None   Collection Time: 09/06/21  6:16 AM   Specimen: Nasopharyngeal Swab; Nasopharyngeal(NP) swabs in vial transport medium  Result Value Ref Range Status   SARS Coronavirus 2 by RT PCR NEGATIVE NEGATIVE Final    Comment: (NOTE) SARS-CoV-2 target nucleic acids are NOT DETECTED.  The SARS-CoV-2 RNA is generally detectable in upper respiratory specimens during the acute phase of infection. The lowest concentration of SARS-CoV-2 viral copies this assay can detect is 138 copies/mL. A negative result does not preclude SARS-Cov-2 infection and should not be used as the sole basis for treatment or other patient management decisions. A negative  result may occur with  improper specimen collection/handling, submission of specimen other than nasopharyngeal swab, presence of viral mutation(s) within the areas targeted by this assay, and inadequate number of viral copies(<138 copies/mL). A negative result must be combined with clinical observations, patient history, and epidemiological information. The expected result is Negative.  Fact Sheet for Patients:  EntrepreneurPulse.com.au  Fact Sheet for Healthcare Providers:  IncredibleEmployment.be  This test is no t yet approved or cleared by the [place] FDA and  has been authorized for detection and/or diagnosis of SARS-CoV-2 by FDA under an Emergency Use Authorization (EUA). This EUA will remain  in effect (meaning this test can be used) for the duration of the COVID-19 declaration under Section 564(b)(1) of the Act, 21 U.S.C.section 360bbb-3(b)(1), unless the authorization is terminated  or revoked sooner.       Influenza A by PCR NEGATIVE NEGATIVE Final   Influenza B by PCR NEGATIVE NEGATIVE Final    Comment: (NOTE) The Xpert Xpress SARS-CoV-2/FLU/RSV plus assay is intended as an aid in the diagnosis of influenza from Nasopharyngeal swab specimens and should not be used as a sole basis for treatment. Nasal washings and aspirates are unacceptable for Xpert Xpress SARS-CoV-2/FLU/RSV testing.  Fact Sheet for Patients: EntrepreneurPulse.com.au  Fact Sheet for Healthcare Providers: IncredibleEmployment.be  This test is not yet approved or cleared by the [place] FDA and has been authorized for detection and/or diagnosis of SARS-CoV-2 by FDA under an Emergency Use Authorization (EUA). This EUA will remain in effect (meaning this test can be used) for the duration of the COVID-19 declaration under Section 564(b)(1) of the Act, 21 U.S.C. section 360bbb-3(b)(1), unless the authorization is  terminated or revoked.  Performed at [facility], [place] [address] [place], [place] [address]   Aerobic Culture w Gram Stain (superficial specimen)     Status: None   Collection Time: 09/06/21  8:56 AM   Specimen: Wound  Result Value Ref Range Status   Specimen Description   Final    WOUND LT FINGER Performed at [facility] [address]  [place], [place] [address]    Special Requests   Final    Normal Performed at [facility], [address], [place], [place] [address]    Gram Stain   Final    MODERATE WBC PRESENT,BOTH PMN AND MONONUCLEAR ABUNDANT GRAM POSITIVE COCCI ABUNDANT GRAM NEGATIVE RODS FEW GRAM VARIABLE ROD    Culture   Final    ABUNDANT STREPTOCOCCUS AGALACTIAE TESTING AGAINST S. AGALACTIAE NOT ROUTINELY PERFORMED DUE TO PREDICTABILITY OF AMP/PEN/VAN SUSCEPTIBILITY. RARE STAPHYLOCOCCUS AUREUS WITHIN MIXED ORGANISMS Performed at [facility], [address] [place], [place] [address]    Report Status 09/09/2021 FINAL  Final   Organism ID, Bacteria STAPHYLOCOCCUS AUREUS  Final      Susceptibility   Staphylococcus aureus - MIC*    CIPROFLOXACIN <=0.5 SENSITIVE Sensitive     ERYTHROMYCIN <=0.25 SENSITIVE Sensitive     GENTAMICIN <=0.5 SENSITIVE Sensitive     OXACILLIN <=0.25 SENSITIVE Sensitive     TETRACYCLINE <=1 SENSITIVE Sensitive     VANCOMYCIN <=0.5 SENSITIVE Sensitive     TRIMETH/SULFA <=10 SENSITIVE Sensitive     CLINDAMYCIN <=0.25 SENSITIVE Sensitive     RIFAMPIN <=0.5 SENSITIVE Sensitive     Inducible Clindamycin NEGATIVE Sensitive     * RARE STAPHYLOCOCCUS AUREUS  Aerobic/Anaerobic Culture w Gram Stain (surgical/deep wound)     Status: None (Preliminary result)   Collection Time: 09/10/21  5:27 PM   Specimen: PATH Soft tissue  Result Value Ref Range Status   Specimen Description   Final    TISSUE LEFT LONG FINGER Performed at [facility], [address]  [place], [place] [address]    Special Requests   Final    NONE Performed at [facility], [address], [place], [place] [address]    Gram Stain   Final    NO SQUAMOUS EPITHELIAL CELLS SEEN NO WBC SEEN NO ORGANISMS SEEN Performed at [facility], [address], [place], [place] [address]    Culture PENDING  Incomplete   Report Status PENDING  Incomplete      Studies: No results found.    Joycelyn Das, MD  [facility] 09/11/2021  If 7PM-7AM, please contact night-coverage

## 2021-09-11 NOTE — [place] FL2 (Signed)
[place] LEVEL OF CARE SCREENING TOOL     IDENTIFICATION  Patient Name: Austin [name] Birthdate: [birth year] Sex: male Admission Date (Current Location): 09/05/2021  [place] and [place] Number:  Kathleen Argue [id number] [place] and Address:  [facility],  [place] [address], [place]      Provider Number: [id number]  Attending Physician Name and Address:  Flora Lipps, MD  Relative Name and Phone Number:  Despina Arias (sister) Ph: [phone]    Current Level of Care: Hospital Recommended Level of Care: [place] Prior Approval Number:    Date Approved/Denied:   PASRR Number: [id number]  Discharge Plan: SNF    Current Diagnoses: Patient Active Problem List   Diagnosis Date Noted   Infection of left hand 09/06/2021   Pressure injury of coccygeal region, stage 1 09/06/2021   Coronary artery disease    Lactic acidosis    Peripheral vascular disease ([place])    Lower limb ulcer, calf ([place]) 07/13/2021   Acute osteomyelitis of right foot ([place]) 03/30/2021   Diabetes mellitus type 2, uncomplicated ([place]) [id number]   Atherosclerosis of native arteries of the extremities with ulceration ([place]) 01/01/2019   Status post below-knee amputation ([place]) 11/30/2018   Type 2 diabetes mellitus with diabetic peripheral angiopathy without gangrene ([place]) 10/14/2018   Malnutrition of moderate degree 10/02/2018   Foot ulcer ([place]) 09/29/2018   Gangrene of left foot ([place]) 02/09/2018   Atherosclerosis of native arteries of extremity with intermittent claudication ([place]) 05/25/2017   Diabetes ([place]) 05/25/2017   Essential hypertension 05/25/2017   Hyperlipidemia 05/25/2017    Orientation RESPIRATION BLADDER Height & Weight     Self, Time, Situation, Place  Normal Incontinent Weight: 244 lb (110.7 kg) Height:  6\' 4"  (193 cm)  BEHAVIORAL SYMPTOMS/MOOD NEUROLOGICAL BOWEL NUTRITION STATUS   (N/A)  (N/A) Continent Diet (Heart healthy/carb  modified)  AMBULATORY STATUS COMMUNICATION OF NEEDS Skin   Extensive Assist Verbally Surgical wounds, Other (Comment), PU Stage and Appropriate Care (Amputation: left finger (will need twice daily dressing changes); RLE wound: silicone foam dressing every 3 days) PU Stage 1 Dressing:  (Pressure injury of coccygeal region)                     Personal Care Assistance Level of Assistance  Bathing, Feeding, Dressing Bathing Assistance: Maximum assistance Feeding assistance: Independent Dressing Assistance: Maximum assistance     Functional Limitations Info  Sight, Hearing, Speech Sight Info: Adequate Hearing Info: Adequate Speech Info: Adequate    SPECIAL CARE FACTORS FREQUENCY  PT (By licensed PT), OT (By licensed OT)     PT Frequency: 5x's/week OT Frequency: 5x's/week            Contractures Contractures Info: Not present    Additional Factors Info  Code Status, Allergies, Insulin Sliding Scale Code Status Info: Full Allergies Info: NKA   Insulin Sliding Scale Info: See discharge summary       Current Medications (09/11/2021):  This is the current hospital active medication list Current Facility-Administered Medications  Medication Dose Route Frequency Provider Last Rate Last Admin   0.9 %  sodium chloride infusion   Intravenous PRN Flora Lipps, MD   Stopped at 09/09/21 1828   acetaminophen (TYLENOL) tablet 650 mg  650 mg Oral Q6H PRN Reubin Milan, MD   650 mg at 09/11/21 1314   Or   acetaminophen (TYLENOL) suppository 650 mg  650 mg Rectal Q6H PRN Reubin Milan, MD  apixaban (ELIQUIS) tablet 5 mg  5 mg Oral BID Bobette Mo, MD   5 mg at 09/11/21 1007   aspirin EC tablet 81 mg  81 mg Oral Daily Bobette Mo, MD   81 mg at 09/11/21 1007   atorvastatin (LIPITOR) tablet 80 mg  80 mg Oral q1800 Bobette Mo, MD   80 mg at 09/10/21 2127   ceFAZolin (ANCEF) IVPB 1 g/50 mL premix  1 g Intravenous Q8H Pokhrel, Laxman, MD 100  mL/hr at 09/11/21 1317 1 g at 09/11/21 1317   docusate sodium (COLACE) capsule 100 mg  100 mg Oral BID Pokhrel, Laxman, MD   100 mg at 09/11/21 1007   hydrALAZINE (APRESOLINE) injection 10 mg  10 mg Intravenous Q6H PRN Pokhrel, Laxman, MD       insulin aspart (novoLOG) injection 0-20 Units  0-20 Units Subcutaneous TID WC Bobette Mo, MD   15 Units at 09/11/21 1315   insulin aspart (novoLOG) injection 0-5 Units  0-5 Units Subcutaneous QHS Pokhrel, Laxman, MD       insulin aspart (novoLOG) injection 5 Units  5 Units Subcutaneous TID WC Pokhrel, Laxman, MD   5 Units at 09/11/21 1316   insulin glargine-yfgn (SEMGLEE) injection 35 Units  35 Units Subcutaneous QHS Pokhrel, Laxman, MD       losartan (COZAAR) tablet 25 mg  25 mg Oral Daily Bobette Mo, MD   25 mg at 09/11/21 1007   nystatin (MYCOSTATIN/NYSTOP) topical powder   Topical TID Bobette Mo, MD   1 application at 09/10/21 2134   ondansetron [facility]) tablet 4 mg  4 mg Oral Q6H PRN Bobette Mo, MD       Or   ondansetron [facility]) injection 4 mg  4 mg Intravenous Q6H PRN Bobette Mo, MD       oxyCODONE (Oxy IR/ROXICODONE) immediate release tablet 5 mg  5 mg Oral Q4H PRN Bobette Mo, MD   5 mg at 09/11/21 1314   polyethylene glycol (MIRALAX / GLYCOLAX) packet 17 g  17 g Oral Daily Pokhrel, Laxman, MD   17 g at 09/11/21 1007     Discharge Medications: Please see discharge summary for a list of discharge medications.  Relevant Imaging Results:  Relevant Lab Results:   Additional Information SSN: [id number]  Ewing Schlein, LCSW

## 2021-09-11 NOTE — TOC Initial Note (Signed)
Transition of Care [facility]) - Initial/Assessment Note   Patient Details  Name: Austin [name] MRN: [id number] Date of Birth: 04-Feb-1956  [facility]) CM/SW Contact:    Sherie Don, LCSW Phone Number: 09/11/2021, 2:07 PM  Clinical Narrative: PT recommended SNF. CSW met with patient, who is agreeable to rehab.  FL2 done; PASRR verified. Initial referral faxed out. TOC awaiting bed offers.  Expected Discharge Plan: Skilled Nursing Facility Barriers to Discharge: Continued Medical Work up, SNF Pending bed offer  Patient Goals and CMS Choice Patient states their goals for this hospitalization and ongoing recovery are:: Go to rehab CMS Medicare.gov Compare Post Acute Care list provided to:: Patient Choice offered to / list presented to : Patient  Expected Discharge Plan and Services Expected Discharge Plan: [place] In-house Referral: Clinical Social Work Post Acute Care Choice: [place] Living arrangements for the past 2 months: Mobile Home           DME Arranged: N/A DME Agency: NA  Prior Living Arrangements/Services Living arrangements for the past 2 months: Mobile Home Lives with:: Friends Patient language and need for interpreter reviewed:: Yes Do you feel safe going back to the place where you live?: Yes      Need for Family Participation in Patient Care: No (Comment) Care giver support system in place?: Yes (comment) Current home services: DME Criminal Activity/Legal Involvement Pertinent to Current Situation/Hospitalization: No - Comment as needed  Activities of Daily Living Home Assistive Devices/Equipment: Environmental consultant (specify type), Wheelchair, CBG Meter, Prosthesis (left lower extremity prosthesis, front wheeled walker, tub/shower unit, standard height toilet) ADL Screening (condition at time of admission) Patient's cognitive ability adequate to safely complete daily activities?: Yes Is the patient deaf or have difficulty  hearing?: No Does the patient have difficulty seeing, even when wearing glasses/contacts?: No Does the patient have difficulty concentrating, remembering, or making decisions?: No Patient able to express need for assistance with ADLs?: Yes Does the patient have difficulty dressing or bathing?: No Independently performs ADLs?: Yes (appropriate for developmental age) Does the patient have difficulty walking or climbing stairs?: No Weakness of Legs: Right Weakness of Arms/Hands: Left  Permission Sought/Granted Permission sought to share information with : Facility Art therapist granted to share information with : Yes, Verbal Permission Granted Permission granted to share info w AGENCY: SNFs  Emotional Assessment Appearance:: Appears stated age Attitude/Demeanor/Rapport: Engaged Affect (typically observed): Accepting Orientation: : Oriented to Self, Oriented to Place, Oriented to  Time, Oriented to Situation Alcohol / Substance Use: Not Applicable Psych Involvement: No (comment)  Admission diagnosis:  Cellulitis of left index finger [L03.012] Infection of left hand [L08.9] Patient Active Problem List   Diagnosis Date Noted   Infection of left hand 09/06/2021   Pressure injury of coccygeal region, stage 1 09/06/2021   Coronary artery disease    Lactic acidosis    Peripheral vascular disease ([place])    Lower limb ulcer, calf ([place]) 07/13/2021   Acute osteomyelitis of right foot ([place]) 03/30/2021   Diabetes mellitus type 2, uncomplicated ([place]) 62/83/6629   Atherosclerosis of native arteries of the extremities with ulceration ([place]) 01/01/2019   Status post below-knee amputation ([place]) 11/30/2018   Type 2 diabetes mellitus with diabetic peripheral angiopathy without gangrene ([place]) 10/14/2018   Malnutrition of moderate degree 10/02/2018   Foot ulcer ([place]) 09/29/2018   Gangrene of left foot ([place]) 02/09/2018   Atherosclerosis of native arteries of extremity with  intermittent claudication ([place]) 05/25/2017   Diabetes ([place])  05/25/2017   Essential hypertension 05/25/2017   Hyperlipidemia 05/25/2017   PCP:  Theotis Burrow, MD Pharmacy:   [id number] [address]Phone: [phone]: [phone] [id number] [place] [address]: [phone]: [phone] Risk Interventions No flowsheet data found.

## 2021-09-11 NOTE — Care Management Important Message (Signed)
Important Message  Patient Details IM Letter given to the Patient. Name: Austin [name] MRN: [id number] Date of Birth: 06-18-1956   Medicare Important Message Given:  Yes     Caren Macadam 09/11/2021, 12:50 PM

## 2021-09-12 LAB — BASIC METABOLIC PANEL
Anion gap: 8 (ref 5–15)
BUN: 13 mg/dL (ref 8–23)
CO2: 25 mmol/L (ref 22–32)
Calcium: 8.4 mg/dL — ABNORMAL LOW (ref 8.9–10.3)
Chloride: 101 mmol/L (ref 98–111)
Creatinine, Ser: 0.68 mg/dL (ref 0.61–1.24)
GFR, Estimated: >60 mL/min (ref >60)
Glucose, Bld: 202 mg/dL — ABNORMAL HIGH (ref 70–99)
Potassium: 4.1 mmol/L (ref 3.5–5.1)
Sodium: 134 mmol/L — ABNORMAL LOW (ref 135–145)

## 2021-09-12 LAB — CBC
HCT: 38.6 % — ABNORMAL LOW (ref 39.0–52.0)
Hemoglobin: 12.4 g/dL — ABNORMAL LOW (ref 13.0–17.0)
MCH: 27.5 pg (ref 26.0–34.0)
MCHC: 32.1 g/dL (ref 30.0–36.0)
MCV: 85.6 fL (ref 80.0–100.0)
Platelets: 277 10*3/uL (ref 150–400)
RBC: 4.51 MIL/uL (ref 4.22–5.81)
RDW: 13.7 % (ref 11.5–15.5)
WBC: 13.3 10*3/uL — ABNORMAL HIGH (ref 4.0–10.5)
nRBC: 0 % (ref 0.0–0.2)

## 2021-09-12 LAB — GLUCOSE, CAPILLARY
Glucose-Capillary: 194 mg/dL — ABNORMAL HIGH (ref 70–99)
Glucose-Capillary: 286 mg/dL — ABNORMAL HIGH (ref 70–99)
Glucose-Capillary: 309 mg/dL — ABNORMAL HIGH (ref 70–99)
Glucose-Capillary: 314 mg/dL — ABNORMAL HIGH (ref 70–99)

## 2021-09-12 LAB — SURGICAL PATHOLOGY

## 2021-09-12 NOTE — Progress Notes (Addendum)
PROGRESS NOTE    Austin [name]  [id number] DOB: Dec 23, 1955 DOA: 09/05/2021  PCP: Theotis Burrow, MD   Brief Narrative:  This 65 years old male with PMH significant for CAD, diabetes type 2, history of DVT, GERD, hyperlipidemia, hypertension, peripheral vascular disease presented in the ED with progressive swelling,  redness of the left middle finger with left hand edema for 5 to 6 days.  Patient has initially developed an ulcer on the tip of left middle finger without an obvious trauma and symptoms continued to get worse.  X-ray of the left hand did not show any fracture or dislocation but soft tissue swelling.  Patient was started on vancomycin and ceftriaxone and admitted for further evaluation.  Plastic surgery was consulted,  Patient underwent amputation of the left middle finger.  Assessment & Plan:   Principal Problem:   Infection of left hand Active Problems:   Essential hypertension   Hyperlipidemia   Type 2 diabetes mellitus with diabetic peripheral angiopathy without gangrene (HCC)   Coronary artery disease   Lactic acidosis   Peripheral vascular disease (HCC)   Pressure injury of coccygeal region, stage 1  Left middle finger infection with cellulitis of left dorsal hand: Patient was seen by plastic surgery and underwent incision and drainage at bedside in the ED.   Status post repeat left long finger amputation at the PIP joint and incision and drainage of the flexor tendon sheath on 09/10/2021.  Patient remains afebrile, Leucocytosis trending down.  Continue IV cefazolin, adequate pain control with oxycodone. Follow orthopedic recommendations.  Follow culture and sensitivity from yesterday.   Follow orthopedic recommendations on discharge.   Essential hypertension: Continue losartan, Continue as needed hydralazine.  Hyperlipidemia: Continue Lipitor.  Coronary artery disease: Continue aspirin, losartan and statin.  Type 2 diabetes with  hyperglycemia: Hemoglobin A1c 12.2.  Poorly controlled Continue Semglee 35 units daily Continue regular insulin sliding scale. Dietitian consult   Pressure injury of coccygeal region, stage 1 POA Continue wound care protocol. Continue prevention, frequent posture change every 4 hours    DVT prophylaxis: Eliquis Code Status: Full code Family Communication: No family at bedside Disposition Plan:   Status is: Inpatient  Remains inpatient appropriate because: Infection of the left middle finger with necrosis requiring debridement and IV antibiotics.  Consultants:  Orthopedics  Procedures:  Left long finger amputation at the PIP joint. Incision and drainage of the flexor tendon sheath left long finger   Antimicrobials:  Anti-infectives (From admission, onward)    Start     Dose/Rate Route Frequency Ordered Stop   09/10/21 1300  ceFAZolin (ANCEF) IVPB 2g/100 mL premix  Status:  Discontinued        2 g 200 mL/hr over 30 Minutes Intravenous On call to O.R. 09/10/21 1236 09/10/21 1843   09/10/21 0600  ceFAZolin (ANCEF) IVPB 1 g/50 mL premix        1 g 100 mL/hr over 30 Minutes Intravenous Every 8 hours 09/09/21 1342     09/07/21 0900  cefTRIAXone (ROCEPHIN) 2 g in sodium chloride 0.9 % 100 mL IVPB  Status:  Discontinued        2 g 200 mL/hr over 30 Minutes Intravenous Every 24 hours 09/06/21 1716 09/09/21 1342   09/07/21 0630  cefTRIAXone (ROCEPHIN) 2 g in sodium chloride 0.9 % 100 mL IVPB  Status:  Discontinued        2 g 200 mL/hr over 30 Minutes Intravenous  Once 09/06/21 1709 09/06/21 1716   09/06/21  2000  vancomycin (VANCOREADY) IVPB 1750 mg/350 mL  Status:  Discontinued        1,750 mg 175 mL/hr over 120 Minutes Intravenous Every 12 hours 09/06/21 0635 09/08/21 0936   09/06/21 0630  cefTRIAXone (ROCEPHIN) 2 g in sodium chloride 0.9 % 100 mL IVPB        2 g 200 mL/hr over 30 Minutes Intravenous  Once 09/06/21 0617 09/06/21 0921   09/06/21 0630  vancomycin (VANCOREADY)  IVPB 2000 mg/400 mL        2,000 mg 200 mL/hr over 120 Minutes Intravenous  Once 09/06/21 0622 09/06/21 1120       Subjective: Patient was seen and examined at bedside.  Overnight events noted.   Patient reports being in a lot of pain in the left hand.  Left hand seen in dressing.   Objective: Vitals:   09/11/21 0500 09/11/21 1426 09/11/21 2020 09/12/21 0459  BP: (!) 153/81 120/74 126/71 117/78  Pulse: 100 93 79 95  Resp: 17 18 17 18   Temp: 98.1 F (36.7 C)  97.9 F (36.6 C) 98.3 F (36.8 C)  TempSrc:      SpO2: 99% 98% 100% 99%  Weight:      Height:        Intake/Output Summary (Last 24 hours) at 09/12/2021 1226 Last data filed at 09/12/2021 0941 Gross per 24 hour  Intake 1710 ml  Output 2200 ml  Net -490 ml   Filed Weights   09/06/21 0607 09/06/21 0744  Weight: 111.1 kg 110.7 kg    Examination:  General exam: Appears comfortable, not in any acute distress.  Deconditioned Respiratory system: Clear to auscultation. Respiratory effort normal.  RR 15 Cardiovascular system: S1-S2 heard, regular rate and rhythm, no murmur. Gastrointestinal system: Abdomen is soft, nontender, nondistended, BS+ Central nervous system: Alert and oriented x 3. No focal neurological deficits. Extremities: Left hand middle finger amputated,  remained in dressing. Skin: No rashes, lesions or ulcers Psychiatry: Judgement and insight appear normal. Mood & affect appropriate.     Data Reviewed: I have personally reviewed following labs and imaging studies  CBC: Recent Labs  Lab 09/07/21 0344 09/09/21 0330 09/10/21 0526 09/11/21 0340 09/12/21 0829  WBC 12.8* 13.3* 16.7* 16.3* 13.3*  HGB 12.6* 13.6 14.7 12.8* 12.4*  HCT 38.0* 40.0 42.8 38.3* 38.6*  MCV 84.8 83.0 82.8 83.4 85.6  PLT 258 263 268 283 [id number]   Basic Metabolic Panel: Recent Labs  Lab 09/07/21 0344 09/09/21 0330 09/10/21 0526 09/11/21 0340 09/12/21 0829  NA 134* 131* 130* 130* 134*  K 4.0 4.1 4.7 4.2 4.1  CL 103 101  99 98 101  CO2 24 23 23 24 25   GLUCOSE 261* 263* 276* 282* 202*  BUN 12 10 9 13 13   CREATININE 0.83 0.75 0.86 0.76 0.68  CALCIUM 8.6* 8.5* 8.8* 8.6* 8.4*  MG  --  1.8  --  2.0  --    GFR: Estimated Creatinine Clearance: 123.8 mL/min (by C-G formula based on SCr of 0.68 mg/dL). Liver Function Tests: Recent Labs  Lab 09/07/21 0344  AST 14*  ALT 15  ALKPHOS 68  BILITOT 0.8  PROT 7.0  ALBUMIN 2.6*   No results for input(s): LIPASE, AMYLASE in the last 168 hours. No results for input(s): AMMONIA in the last 168 hours. Coagulation Profile: No results for input(s): INR, PROTIME in the last 168 hours. Cardiac Enzymes: No results for input(s): CKTOTAL, CKMB, CKMBINDEX, TROPONINI in the last 168 hours. BNP (last 3  results) No results for input(s): PROBNP in the last 8760 hours. HbA1C: No results for input(s): HGBA1C in the last 72 hours. CBG: Recent Labs  Lab 09/11/21 1158 09/11/21 1629 09/11/21 2022 09/12/21 0738 09/12/21 1158  GLUCAP 303* 300* 305* 194* 286*   Lipid Profile: No results for input(s): CHOL, HDL, LDLCALC, TRIG, CHOLHDL, LDLDIRECT in the last 72 hours. Thyroid Function Tests: No results for input(s): TSH, T4TOTAL, FREET4, T3FREE, THYROIDAB in the last 72 hours. Anemia Panel: No results for input(s): VITAMINB12, FOLATE, FERRITIN, TIBC, IRON, RETICCTPCT in the last 72 hours. Sepsis Labs: Recent Labs  Lab 09/06/21 0616 09/06/21 0830  LATICACIDVEN 2.2* 2.1*    Recent Results (from the past 240 hour(s))  Resp Panel by RT-PCR (Flu A&B, Covid) Nasopharyngeal Swab     Status: None   Collection Time: 09/06/21  6:16 AM   Specimen: Nasopharyngeal Swab; Nasopharyngeal(NP) swabs in vial transport medium  Result Value Ref Range Status   SARS Coronavirus 2 by RT PCR NEGATIVE NEGATIVE Final    Comment: (NOTE) SARS-CoV-2 target nucleic acids are NOT DETECTED.  The SARS-CoV-2 RNA is generally detectable in upper respiratory specimens during the acute phase of  infection. The lowest concentration of SARS-CoV-2 viral copies this assay can detect is 138 copies/mL. A negative result does not preclude SARS-Cov-2 infection and should not be used as the sole basis for treatment or other patient management decisions. A negative result may occur with  improper specimen collection/handling, submission of specimen other than nasopharyngeal swab, presence of viral mutation(s) within the areas targeted by this assay, and inadequate number of viral copies(<138 copies/mL). A negative result must be combined with clinical observations, patient history, and epidemiological information. The expected result is Negative.  Fact Sheet for Patients:  BloggerCourse.com  Fact Sheet for Healthcare Providers:  SeriousBroker.it  This test is no t yet approved or cleared by the [place] FDA and  has been authorized for detection and/or diagnosis of SARS-CoV-2 by FDA under an Emergency Use Authorization (EUA). This EUA will remain  in effect (meaning this test can be used) for the duration of the COVID-19 declaration under Section 564(b)(1) of the Act, 21 U.S.C.section 360bbb-3(b)(1), unless the authorization is terminated  or revoked sooner.       Influenza A by PCR NEGATIVE NEGATIVE Final   Influenza B by PCR NEGATIVE NEGATIVE Final    Comment: (NOTE) The Xpert Xpress SARS-CoV-2/FLU/RSV plus assay is intended as an aid in the diagnosis of influenza from Nasopharyngeal swab specimens and should not be used as a sole basis for treatment. Nasal washings and aspirates are unacceptable for Xpert Xpress SARS-CoV-2/FLU/RSV testing.  Fact Sheet for Patients: BloggerCourse.com  Fact Sheet for Healthcare Providers: SeriousBroker.it  This test is not yet approved or cleared by the [place] FDA and has been authorized for detection and/or diagnosis of SARS-CoV-2  by FDA under an Emergency Use Authorization (EUA). This EUA will remain in effect (meaning this test can be used) for the duration of the COVID-19 declaration under Section 564(b)(1) of the Act, 21 U.S.C. section 360bbb-3(b)(1), unless the authorization is terminated or revoked.  Performed at [facility], [address], [place], [place] [address]   Aerobic Culture w Gram Stain (superficial specimen)     Status: None   Collection Time: 09/06/21  8:56 AM   Specimen: Wound  Result Value Ref Range Status   Specimen Description   Final    WOUND LT FINGER Performed at [facility], [address] [place], [place] [address]    Special Requests   Final    Normal Performed at [facility], [place] [address] [place], [place] [address]    Gram Stain   Final    MODERATE WBC PRESENT,BOTH PMN AND MONONUCLEAR ABUNDANT GRAM POSITIVE COCCI ABUNDANT GRAM NEGATIVE RODS FEW GRAM VARIABLE ROD    Culture   Final    ABUNDANT STREPTOCOCCUS AGALACTIAE TESTING AGAINST S. AGALACTIAE NOT ROUTINELY PERFORMED DUE TO PREDICTABILITY OF AMP/PEN/VAN SUSCEPTIBILITY. RARE STAPHYLOCOCCUS AUREUS WITHIN MIXED ORGANISMS Performed at [facility], [place] [address], [place], [place] [address]    Report Status 09/09/2021 FINAL  Final   Organism ID, Bacteria STAPHYLOCOCCUS AUREUS  Final      Susceptibility   Staphylococcus aureus - MIC*    CIPROFLOXACIN <=0.5 SENSITIVE Sensitive     ERYTHROMYCIN <=0.25 SENSITIVE Sensitive     GENTAMICIN <=0.5 SENSITIVE Sensitive     OXACILLIN <=0.25 SENSITIVE Sensitive     TETRACYCLINE <=1 SENSITIVE Sensitive     VANCOMYCIN <=0.5 SENSITIVE Sensitive     TRIMETH/SULFA <=10 SENSITIVE Sensitive     CLINDAMYCIN <=0.25 SENSITIVE Sensitive     RIFAMPIN <=0.5 SENSITIVE Sensitive     Inducible Clindamycin NEGATIVE Sensitive     * RARE STAPHYLOCOCCUS AUREUS  Aerobic/Anaerobic Culture w Gram Stain (surgical/deep wound)      Status: None (Preliminary result)   Collection Time: 09/10/21  5:27 PM   Specimen: PATH Soft tissue  Result Value Ref Range Status   Specimen Description   Final    TISSUE LEFT LONG FINGER Performed at [place] [address] [place], [place] [address]    Special Requests   Final    NONE Performed at [facility], [place] [address] [place], [place] [address]    Gram Stain   Final    NO SQUAMOUS EPITHELIAL CELLS SEEN NO WBC SEEN NO ORGANISMS SEEN    Culture   Final    NO GROWTH 2 DAYS Performed at [facility], [place] [address], [place], [place] [address]    Report Status PENDING  Incomplete    Radiology Studies: No results found.  Scheduled Meds:  apixaban  5 mg Oral BID   aspirin EC  81 mg Oral Daily   atorvastatin  80 mg Oral q1800   docusate sodium  100 mg Oral BID   insulin aspart  0-20 Units Subcutaneous TID WC   insulin aspart  0-5 Units Subcutaneous QHS   insulin aspart  5 Units Subcutaneous TID WC   insulin glargine-yfgn  35 Units Subcutaneous QHS   losartan  25 mg Oral Daily   nystatin   Topical TID   polyethylene glycol  17 g Oral Daily   Continuous Infusions:  sodium chloride Stopped (09/09/21 1828)    ceFAZolin (ANCEF) IV 1 g (09/12/21 0501)     LOS: 6 days    Time spent: 35 mins    Lilliauna Van, MD [facility]   If 7PM-7AM, please contact night-coverage

## 2021-09-12 NOTE — Progress Notes (Signed)
2 Days Post-Op  Subjective: Pt resting in bed, wife at bedside. Some hand tenderness, mild pain  Objective: Vital signs in last 24 hours: Temp:  [97.9 F (36.6 C)-98.3 F (36.8 C)] 98.3 F (36.8 C) (12/07 0459) Pulse Rate:  [79-95] 95 (12/07 0459) Resp:  [17-18] 18 (12/07 0459) BP: (117-126)/(71-78) 117/78 (12/07 0459) SpO2:  [98 %-100 %] 99 % (12/07 0459) Last BM Date: 09/11/21  Intake/Output from previous day: 12/06 0701 - 12/07 0700 In: [id number] [P.O.:1140; I.V.:420; IV Piggyback:150] Out: 1800 [Urine:1800] Intake/Output this shift: Total I/O In: 240 [P.O.:240] Out: 900 [Urine:900]  General appearance: alert, cooperative, and no distress Resp: unlabored Extremities: Left hand: Left long finger s/p amputation at PIP. Swelling as expected. Dorsally, skin is healing well. No erythema ort cellulitis changes. Along the volar aspect of the left long finger, there is some tissue/epithelial loss. No frank necrosis is noted. No foul odor. Incisions intact. No purulent drainage. Mild tenderness along proximal left long finger amputation stump. No cellulitis changes. Slightly erythema of volar hand and MCP. No fluctuance or purulence noted. No crepitus.          Lab Results:  CBC Latest Ref Rng & Units 09/12/2021 09/11/2021 09/10/2021  WBC 4.0 - 10.5 K/uL 13.3(H) 16.3(H) 16.7(H)  Hemoglobin 13.0 - 17.0 g/dL 12.4(L) 12.8(L) 14.7  Hematocrit 39.0 - 52.0 % 38.6(L) 38.3(L) 42.8  Platelets 150 - 400 K/uL 277 283 268    BMET Recent Labs    09/11/21 0340 09/12/21 0829  NA 130* 134*  K 4.2 4.1  CL 98 101  CO2 24 25  GLUCOSE 282* 202*  BUN 13 13  CREATININE 0.76 0.68  CALCIUM 8.6* 8.4*   PT/INR No results for input(s): LABPROT, INR in the last 72 hours. ABG No results for input(s): PHART, HCO3 in the last 72 hours.  Invalid input(s): PCO2, PO2  Studies/Results: No results found.  Anti-infectives: Anti-infectives (From admission, onward)    Start     Dose/Rate Route  Frequency Ordered Stop   09/10/21 1300  ceFAZolin (ANCEF) IVPB 2g/100 mL premix  Status:  Discontinued        2 g 200 mL/hr over 30 Minutes Intravenous On call to O.R. 09/10/21 1236 09/10/21 1843   09/10/21 0600  ceFAZolin (ANCEF) IVPB 1 g/50 mL premix        1 g 100 mL/hr over 30 Minutes Intravenous Every 8 hours 09/09/21 1342     09/07/21 0900  cefTRIAXone (ROCEPHIN) 2 g in sodium chloride 0.9 % 100 mL IVPB  Status:  Discontinued        2 g 200 mL/hr over 30 Minutes Intravenous Every 24 hours 09/06/21 1716 09/09/21 1342   09/07/21 0630  cefTRIAXone (ROCEPHIN) 2 g in sodium chloride 0.9 % 100 mL IVPB  Status:  Discontinued        2 g 200 mL/hr over 30 Minutes Intravenous  Once 09/06/21 1709 09/06/21 1716   09/06/21 2000  vancomycin (VANCOREADY) IVPB 1750 mg/350 mL  Status:  Discontinued        1,750 mg 175 mL/hr over 120 Minutes Intravenous Every 12 hours 09/06/21 0635 09/08/21 0936   09/06/21 0630  cefTRIAXone (ROCEPHIN) 2 g in sodium chloride 0.9 % 100 mL IVPB        2 g 200 mL/hr over 30 Minutes Intravenous  Once 09/06/21 0617 09/06/21 0921   09/06/21 0630  vancomycin (VANCOREADY) IVPB 2000 mg/400 mL        2,000 mg 200 mL/hr over  120 Minutes Intravenous  Once 09/06/21 0622 09/06/21 1120       Assessment/Plan: s/p Procedure(s): AMPUTATION DIGIT IRRIGATION AND DEBRIDEMENT OF LEFT LONG FINGER WOUND  Recommend continue with daily xeroform, 4x4 gauze, kerlix, ace wrap to left hand. Continue to monitor skin for improvement in superficial skin loss. No current signs of overt necrosis, some questionable darkening pigmentation distally.   CBC improving, likely jumped due to surgery. No current growth of specimens from surgery.  Discussed with patient he is at increased risk of complications given his overall health status and diabetic history. He is understanding of this.   Pictures were obtained of the patient and placed in the chart with the patient's or guardian's  permission.    LOS: 6 days    Leslee Home, PA-C 09/12/2021

## 2021-09-12 NOTE — Progress Notes (Signed)
Inpatient Diabetes Program Recommendations  AACE/ADA: New Consensus Statement on Inpatient Glycemic Control (2015)  Target Ranges:  Prepandial:   less than 140 mg/dL      Peak postprandial:   less than 180 mg/dL (1-2 hours)      Critically ill patients:  140 - 180 mg/dL   Lab Results  Component Value Date   GLUCAP 194 (H) 09/12/2021   HGBA1C 12.8 (H) 09/07/2021    Review of Glycemic Control  Latest Reference Range & Units 09/11/21 07:48 09/11/21 11:58 09/11/21 16:29 09/11/21 20:22 09/12/21 07:38  Glucose-Capillary 70 - 99 mg/dL 786 (H)  Novolog 14 units 303 (H)  Novolog 20 units 300 (H)  Novolog 16 units 305 (H)  Novolog 4 units 194 (H)  Novolog 9 units   Diabetes history: DM 2 Outpatient Diabetes medications: Humalog 20 units tid, Lantus 30 units qhs Current orders for Inpatient glycemic control:  Semglee 35 units qhs Novolog 0-20 units tid + hs Novolog 5 units tid meal coverage  Inpatient Diabetes Program Recommendations:    -   Increase Novolog meal coverage to 10 units tid if eating >50% of meals  Thanks,  Christena Deem RN, MSN, BC-ADM Inpatient Diabetes Coordinator Team Pager [phone] (8a-5p)

## 2021-09-12 NOTE — Progress Notes (Signed)
Physical Therapy Treatment Patient Details Name: Austin [name] MRN: [id number] DOB: [birth year] Today's Date: 09/12/2021   History of Present Illness 65 y.o. male admitted to hospital with progressive swelling redness of the left middle finger with left hand edema. X-ray of the left hand did not show any fracture or dislocation but soft tissue swelling pt is s/p L long finger I&D on 12/2 at bedside, to return to OR for second I&D 12/5   PMH: coronary artery disease, diabetes mellitus type 2, history of DVT, GERD, hyperlipidemia, hypertension and peripheral vascular disease, L BKA    PT Comments    Patient is alert, conversive, able to follow directions and participate in bed mobility and sitting balance activities  and LE exercises. Patient demonstrates min guard sitting balance. Patient will benefit from rehab at SNF level.Continue PT   Recommendations for follow up therapy are one component of a multi-disciplinary discharge planning process, led by the attending physician.  Recommendations may be updated based on patient status, additional functional criteria and insurance authorization.  Follow Up Recommendations  Skilled nursing-short term rehab (<3 hours/day)     Assistance Recommended at Discharge Frequent or constant Supervision/Assistance  Equipment Recommendations  None recommended by PT    Recommendations for Other Services       Precautions / Restrictions Precautions Precautions: Fall Precaution Comments: left hand infection Restrictions Weight Bearing Restrictions: Yes LUE Weight Bearing: Non weight bearing LLE Weight Bearing: Non weight bearing     Mobility  Bed Mobility   Bed Mobility: Sit to Supine;Supine to Sit     Supine to sit: Mod assist Sit to supine: Min assist   General bed mobility comments: extra time, pulls on rail with right arm to pull self around,, able to move legs over bed edge, mod assistnace to fully sit upright. has forward flexed  upper  runk.  Patient able to place  legs onto bed with min assistnace    Transfers Overall transfer level: Needs assistance                      Ambulation/Gait                   Stairs             Wheelchair Mobility    Modified Rankin (Stroke Patients Only)       Balance Overall balance assessment: Needs assistance Sitting-balance support: Single extremity supported;Feet supported Sitting balance-Leahy Scale: Fair Sitting balance - Comments: worked on siting posture and upper trunk extension, lateral lean down to each elbow and sitting back upright. Postural control: Right lateral lean                                  Cognition Arousal/Alertness: Awake/alert Behavior During Therapy: WFL for tasks assessed/performed Overall Cognitive Status: Impaired/Different from baseline                                 General Comments: more alert, O to hospital, follows  1 step directions consistently. Answer peraonal q's appropriately        Exercises General Exercises - Lower Extremity Long Arc Quad: AROM;AAROM;Both;10 reps;Seated    General Comments        Pertinent Vitals/Pain Pain Assessment: Faces Faces Pain Scale: Hurts even more Pain Location: L hand Pain Descriptors / Indicators: Aching  Pain Intervention(s): Patient requesting pain meds-RN notified;Limited activity within patient's tolerance    Home Living                          Prior Function            PT Goals (current goals can now be found in the care plan section) Progress towards PT goals: Progressing toward goals    Frequency    Min 2X/week      PT Plan Current plan remains appropriate    Co-evaluation              AM-PAC PT "6 Clicks" Mobility   Outcome Measure  Help needed turning from your back to your side while in a flat bed without using bedrails?: A Little Help needed moving from lying on your back to sitting on the  side of a flat bed without using bedrails?: A Little Help needed moving to and from a bed to a chair (including a wheelchair)?: A Lot Help needed standing up from a chair using your arms (e.g., wheelchair or bedside chair)?: Total Help needed to walk in hospital room?: Total Help needed climbing 3-5 steps with a railing? : Total 6 Click Score: 11    End of Session   Activity Tolerance: Patient tolerated treatment well Patient left: in bed;with call bell/phone within reach;with bed alarm set Nurse Communication: Mobility status PT Visit Diagnosis: Other abnormalities of gait and mobility (R26.89)     Time: [id number] PT Time Calculation (min) (ACUTE ONLY): 21 min  Charges:  $Therapeutic Activity: 8-22 mins                     [facility] Pager [phone] Office [phone]    Claretha Cooper 09/12/2021, 4:32 PM

## 2021-09-13 LAB — CBC
HCT: 39.1 % (ref 39.0–52.0)
Hemoglobin: 13.4 g/dL (ref 13.0–17.0)
MCH: 28.3 pg (ref 26.0–34.0)
MCHC: 34.3 g/dL (ref 30.0–36.0)
MCV: 82.7 fL (ref 80.0–100.0)
Platelets: 260 10*3/uL (ref 150–400)
RBC: 4.73 MIL/uL (ref 4.22–5.81)
RDW: 13.5 % (ref 11.5–15.5)
WBC: 10.5 10*3/uL (ref 4.0–10.5)
nRBC: 0 % (ref 0.0–0.2)

## 2021-09-13 LAB — PHOSPHORUS: Phosphorus: 2.8 mg/dL (ref 2.5–4.6)

## 2021-09-13 LAB — BASIC METABOLIC PANEL
Anion gap: 8 (ref 5–15)
BUN: 14 mg/dL (ref 8–23)
CO2: 25 mmol/L (ref 22–32)
Calcium: 8.4 mg/dL — ABNORMAL LOW (ref 8.9–10.3)
Chloride: 100 mmol/L (ref 98–111)
Creatinine, Ser: 0.69 mg/dL (ref 0.61–1.24)
GFR, Estimated: >60 mL/min (ref >60)
Glucose, Bld: 271 mg/dL — ABNORMAL HIGH (ref 70–99)
Potassium: 4.3 mmol/L (ref 3.5–5.1)
Sodium: 133 mmol/L — ABNORMAL LOW (ref 135–145)

## 2021-09-13 LAB — GLUCOSE, CAPILLARY
Glucose-Capillary: 235 mg/dL — ABNORMAL HIGH (ref 70–99)
Glucose-Capillary: 273 mg/dL — ABNORMAL HIGH (ref 70–99)
Glucose-Capillary: 270 mg/dL — ABNORMAL HIGH (ref 70–99)
Glucose-Capillary: 294 mg/dL — ABNORMAL HIGH (ref 70–99)

## 2021-09-13 LAB — MAGNESIUM: Magnesium: 1.9 mg/dL (ref 1.7–2.4)

## 2021-09-13 NOTE — TOC Progression Note (Signed)
Transition of Care [facility]) - Progression Note   Patient Details  Name: KEYWON MESTRE MRN: [id number] Date of Birth: 01/26/1956  Transition of Care [facility] CM/SW Contact  Ewing Schlein, LCSW Phone Number: 09/13/2021, 2:15 PM  Clinical Narrative: CSW provided patient with bed offers for SNF. TOC awaiting to find out if patient will discharge to SNF on IV or PO antibiotics as insurance authorization cannot be started without this being confirmed.  Expected Discharge Plan: Skilled Nursing Facility Barriers to Discharge: Continued Medical Work up, SNF Pending bed offer  Expected Discharge Plan and Services Expected Discharge Plan: Skilled Nursing Facility In-house Referral: Clinical Social Work Post Acute Care Choice: Skilled Nursing Facility Living arrangements for the past 2 months: Mobile Home           DME Arranged: N/A DME Agency: NA  Readmission Risk Interventions No flowsheet data found.

## 2021-09-13 NOTE — Progress Notes (Signed)
PROGRESS NOTE    Austin [name]  [id number] DOB: 12/30/1955 DOA: 09/05/2021  PCP: Theotis Burrow, MD   Brief Narrative:  This 65 years old male with PMH significant for CAD, diabetes type 2, history of DVT, GERD, hyperlipidemia, hypertension, peripheral vascular disease presented in the ED with progressive swelling,  redness of the left middle finger with left hand edema for 5 to 6 days.  Patient has initially developed an ulcer on the tip of left middle finger without an obvious trauma and symptoms continued to get worse.  X-ray of the left hand did not show any fracture or dislocation but soft tissue swelling.  Patient was started on vancomycin and ceftriaxone and admitted for further evaluation.  Plastic surgery was consulted,  Patient underwent amputation of the left middle finger.  Assessment & Plan:   Principal Problem:   Infection of left hand Active Problems:   Essential hypertension   Hyperlipidemia   Type 2 diabetes mellitus with diabetic peripheral angiopathy without gangrene (HCC)   Coronary artery disease   Lactic acidosis   Peripheral vascular disease (HCC)   Pressure injury of coccygeal region, stage 1  Left middle finger infection with cellulitis of left dorsal hand: Patient was seen by plastic surgery and underwent incision and drainage at bedside in the ED.   Status post  left long finger amputation at the PIP joint and incision and drainage of the flexor tendon sheath on 09/10/2021.  Patient remains afebrile, Leucocytosis trending down.  Continue IV cefazolin, adequate pain control with oxycodone. Follow orthopedic recommendations.  Cultures so far no growth. Continue local wound care with daily xeroform, 4 x 4 guaze, ace wrap. Follow orthopedic recommendations on discharge.   Essential hypertension: Continue losartan, Continue as needed hydralazine.  Hyperlipidemia: Continue Lipitor.  Coronary artery disease: Continue aspirin, losartan and  statin.  Type 2 diabetes with hyperglycemia: Hemoglobin A1c 12.2.  Poorly controlled Continue Semglee 35 units daily Continue regular insulin sliding scale. Dietitian consult   Pressure injury of coccygeal region, stage 1 POA Continue wound care protocol. Continue prevention, frequent posture change every 4 hours    DVT prophylaxis: Eliquis Code Status: Full code Family Communication: No family at bedside Disposition Plan:   Status is: Inpatient  Remains inpatient appropriate because: Infection of the left middle finger with necrosis requiring debridement and IV antibiotics.  Consultants:  Orthopedics  Procedures:  Left long finger amputation at the PIP joint. Incision and drainage of the flexor tendon sheath left long finger   Antimicrobials:  Anti-infectives (From admission, onward)    Start     Dose/Rate Route Frequency Ordered Stop   09/10/21 1300  ceFAZolin (ANCEF) IVPB 2g/100 mL premix  Status:  Discontinued        2 g 200 mL/hr over 30 Minutes Intravenous On call to O.R. 09/10/21 1236 09/10/21 1843   09/10/21 0600  ceFAZolin (ANCEF) IVPB 1 g/50 mL premix        1 g 100 mL/hr over 30 Minutes Intravenous Every 8 hours 09/09/21 1342     09/07/21 0900  cefTRIAXone (ROCEPHIN) 2 g in sodium chloride 0.9 % 100 mL IVPB  Status:  Discontinued        2 g 200 mL/hr over 30 Minutes Intravenous Every 24 hours 09/06/21 1716 09/09/21 1342   09/07/21 0630  cefTRIAXone (ROCEPHIN) 2 g in sodium chloride 0.9 % 100 mL IVPB  Status:  Discontinued        2 g 200 mL/hr over 30 Minutes  Intravenous  Once 09/06/21 1709 09/06/21 1716   09/06/21 2000  vancomycin (VANCOREADY) IVPB 1750 mg/350 mL  Status:  Discontinued        1,750 mg 175 mL/hr over 120 Minutes Intravenous Every 12 hours 09/06/21 0635 09/08/21 0936   09/06/21 0630  cefTRIAXone (ROCEPHIN) 2 g in sodium chloride 0.9 % 100 mL IVPB        2 g 200 mL/hr over 30 Minutes Intravenous  Once 09/06/21 0617 09/06/21 0921   09/06/21  0630  vancomycin (VANCOREADY) IVPB 2000 mg/400 mL        2,000 mg 200 mL/hr over 120 Minutes Intravenous  Once 09/06/21 0622 09/06/21 1120       Subjective: Patient was seen and examined at bedside.  Overnight events noted.   Patient reports being in a lot of pain,  reports pain is manageable with pain medications.   His left hand is covered in dressing.   Objective: Vitals:   09/12/21 0459 09/12/21 1419 09/12/21 2111 09/13/21 0540  BP: 117/78 115/71 132/87 133/73  Pulse: 95 81 79 71  Resp: 18 18 16 16   Temp: 98.3 F (36.8 C)  (!) 97.5 F (36.4 C) 98.2 F (36.8 C)  TempSrc:   Oral Oral  SpO2: 99% 98% 98% 99%  Weight:      Height:        Intake/Output Summary (Last 24 hours) at 09/13/2021 1127 Last data filed at 09/13/2021 0755 Gross per 24 hour  Intake 1110 ml  Output 1600 ml  Net -490 ml   Filed Weights   09/06/21 0607 09/06/21 0744  Weight: 111.1 kg 110.7 kg    Examination:  General exam: Appears comfortable, but appears in lot of pain.  Deconditioned Respiratory system: Clear to auscultation. Respiratory effort normal.  RR 15 Cardiovascular system: S1-S2 heard, regular rate and rhythm, no murmur. Gastrointestinal system: Abdomen is soft, nontender, nondistended, BS+ Central nervous system: Alert and oriented x 3. No focal neurological deficits. Extremities: Left hand middle finger amputated,  remained in dressing. Skin: No rashes, lesions or ulcers Psychiatry: Judgement and insight appear normal. Mood & affect appropriate.     Data Reviewed: I have personally reviewed following labs and imaging studies  CBC: Recent Labs  Lab 09/09/21 0330 09/10/21 0526 09/11/21 0340 09/12/21 0829 09/13/21 0342  WBC 13.3* 16.7* 16.3* 13.3* 10.5  HGB 13.6 14.7 12.8* 12.4* 13.4  HCT 40.0 42.8 38.3* 38.6* 39.1  MCV 83.0 82.8 83.4 85.6 82.7  PLT 263 268 283 277 [id number]   Basic Metabolic Panel: Recent Labs  Lab 09/09/21 0330 09/10/21 0526 09/11/21 0340 09/12/21 0829  09/13/21 0342  NA 131* 130* 130* 134* 133*  K 4.1 4.7 4.2 4.1 4.3  CL 101 99 98 101 100  CO2 23 23 24 25 25   GLUCOSE 263* 276* 282* 202* 271*  BUN 10 9 13 13 14   CREATININE 0.75 0.86 0.76 0.68 0.69  CALCIUM 8.5* 8.8* 8.6* 8.4* 8.4*  MG 1.8  --  2.0  --  1.9  PHOS  --   --   --   --  2.8   GFR: Estimated Creatinine Clearance: 123.8 mL/min (by C-G formula based on SCr of 0.69 mg/dL). Liver Function Tests: Recent Labs  Lab 09/07/21 0344  AST 14*  ALT 15  ALKPHOS 68  BILITOT 0.8  PROT 7.0  ALBUMIN 2.6*   No results for input(s): LIPASE, AMYLASE in the last 168 hours. No results for input(s): AMMONIA in the last 168 hours.  Coagulation Profile: No results for input(s): INR, PROTIME in the last 168 hours. Cardiac Enzymes: No results for input(s): CKTOTAL, CKMB, CKMBINDEX, TROPONINI in the last 168 hours. BNP (last 3 results) No results for input(s): PROBNP in the last 8760 hours. HbA1C: No results for input(s): HGBA1C in the last 72 hours. CBG: Recent Labs  Lab 09/12/21 0738 09/12/21 1158 09/12/21 1634 09/12/21 2104 09/13/21 0728  GLUCAP 194* 286* 309* 314* 235*   Lipid Profile: No results for input(s): CHOL, HDL, LDLCALC, TRIG, CHOLHDL, LDLDIRECT in the last 72 hours. Thyroid Function Tests: No results for input(s): TSH, T4TOTAL, FREET4, T3FREE, THYROIDAB in the last 72 hours. Anemia Panel: No results for input(s): VITAMINB12, FOLATE, FERRITIN, TIBC, IRON, RETICCTPCT in the last 72 hours. Sepsis Labs: No results for input(s): PROCALCITON, LATICACIDVEN in the last 168 hours.   Recent Results (from the past 240 hour(s))  Resp Panel by RT-PCR (Flu A&B, Covid) Nasopharyngeal Swab     Status: None   Collection Time: 09/06/21  6:16 AM   Specimen: Nasopharyngeal Swab; Nasopharyngeal(NP) swabs in vial transport medium  Result Value Ref Range Status   SARS Coronavirus 2 by RT PCR NEGATIVE NEGATIVE Final    Comment: (NOTE) SARS-CoV-2 target nucleic acids are NOT  DETECTED.  The SARS-CoV-2 RNA is generally detectable in upper respiratory specimens during the acute phase of infection. The lowest concentration of SARS-CoV-2 viral copies this assay can detect is 138 copies/mL. A negative result does not preclude SARS-Cov-2 infection and should not be used as the sole basis for treatment or other patient management decisions. A negative result may occur with  improper specimen collection/handling, submission of specimen other than nasopharyngeal swab, presence of viral mutation(s) within the areas targeted by this assay, and inadequate number of viral copies(<138 copies/mL). A negative result must be combined with clinical observations, patient history, and epidemiological information. The expected result is Negative.  Fact Sheet for Patients:  EntrepreneurPulse.com.au  Fact Sheet for Healthcare Providers:  IncredibleEmployment.be  This test is no t yet approved or cleared by the [place] FDA and  has been authorized for detection and/or diagnosis of SARS-CoV-2 by FDA under an Emergency Use Authorization (EUA). This EUA will remain  in effect (meaning this test can be used) for the duration of the COVID-19 declaration under Section 564(b)(1) of the Act, 21 U.S.C.section 360bbb-3(b)(1), unless the authorization is terminated  or revoked sooner.       Influenza A by PCR NEGATIVE NEGATIVE Final   Influenza B by PCR NEGATIVE NEGATIVE Final    Comment: (NOTE) The Xpert Xpress SARS-CoV-2/FLU/RSV plus assay is intended as an aid in the diagnosis of influenza from Nasopharyngeal swab specimens and should not be used as a sole basis for treatment. Nasal washings and aspirates are unacceptable for Xpert Xpress SARS-CoV-2/FLU/RSV testing.  Fact Sheet for Patients: EntrepreneurPulse.com.au  Fact Sheet for Healthcare Providers: IncredibleEmployment.be  This test is not yet  approved or cleared by the [place] FDA and has been authorized for detection and/or diagnosis of SARS-CoV-2 by FDA under an Emergency Use Authorization (EUA). This EUA will remain in effect (meaning this test can be used) for the duration of the COVID-19 declaration under Section 564(b)(1) of the Act, 21 U.S.C. section 360bbb-3(b)(1), unless the authorization is terminated or revoked.  Performed at [facility], [address], [place], [place] [address]   Aerobic Culture w Gram Stain (superficial specimen)     Status: None   Collection Time: 09/06/21  8:56 AM  Specimen: Wound  Result Value Ref Range Status   Specimen Description   Final    WOUND LT FINGER Performed at [facility], [address] [place], [place] [address]    Special Requests   Final    Normal Performed at [facility], [address] [place], [place] [address]    Gram Stain   Final    MODERATE WBC PRESENT,BOTH PMN AND MONONUCLEAR ABUNDANT GRAM POSITIVE COCCI ABUNDANT GRAM NEGATIVE RODS FEW GRAM VARIABLE ROD    Culture   Final    ABUNDANT STREPTOCOCCUS AGALACTIAE TESTING AGAINST S. AGALACTIAE NOT ROUTINELY PERFORMED DUE TO PREDICTABILITY OF AMP/PEN/VAN SUSCEPTIBILITY. RARE STAPHYLOCOCCUS AUREUS WITHIN MIXED ORGANISMS Performed at [facility], [address], [place], [place] [address]    Report Status 09/09/2021 FINAL  Final   Organism ID, Bacteria STAPHYLOCOCCUS AUREUS  Final      Susceptibility   Staphylococcus aureus - MIC*    CIPROFLOXACIN <=0.5 SENSITIVE Sensitive     ERYTHROMYCIN <=0.25 SENSITIVE Sensitive     GENTAMICIN <=0.5 SENSITIVE Sensitive     OXACILLIN <=0.25 SENSITIVE Sensitive     TETRACYCLINE <=1 SENSITIVE Sensitive     VANCOMYCIN <=0.5 SENSITIVE Sensitive     TRIMETH/SULFA <=10 SENSITIVE Sensitive     CLINDAMYCIN <=0.25 SENSITIVE Sensitive     RIFAMPIN <=0.5 SENSITIVE Sensitive     Inducible Clindamycin  NEGATIVE Sensitive     * RARE STAPHYLOCOCCUS AUREUS  Aerobic/Anaerobic Culture w Gram Stain (surgical/deep wound)     Status: None (Preliminary result)   Collection Time: 09/10/21  5:27 PM   Specimen: PATH Soft tissue  Result Value Ref Range Status   Specimen Description   Final    TISSUE LEFT LONG FINGER Performed at [facility], [address], [place], [place] [address]    Special Requests   Final    NONE Performed at [facility], [address] [place], [place] [address]    Gram Stain   Final    NO SQUAMOUS EPITHELIAL CELLS SEEN NO WBC SEEN NO ORGANISMS SEEN    Culture   Final    CULTURE REINCUBATED FOR BETTER GROWTH Performed at [facility], [address] [place], [place] [address]    Report Status PENDING  Incomplete    Radiology Studies: No results found.  Scheduled Meds:  apixaban  5 mg Oral BID   aspirin EC  81 mg Oral Daily   atorvastatin  80 mg Oral q1800   docusate sodium  100 mg Oral BID   insulin aspart  0-20 Units Subcutaneous TID WC   insulin aspart  0-5 Units Subcutaneous QHS   insulin aspart  5 Units Subcutaneous TID WC   insulin glargine-yfgn  35 Units Subcutaneous QHS   losartan  25 mg Oral Daily   nystatin   Topical TID   polyethylene glycol  17 g Oral Daily   Continuous Infusions:  sodium chloride 10 mL/hr at 09/13/21 0425    ceFAZolin (ANCEF) IV 1 g (09/13/21 0503)     LOS: 7 days    Time spent: 25 mins    Jazmeen Axtell, MD [facility]   If 7PM-7AM, please contact night-coverage

## 2021-09-14 ENCOUNTER — Inpatient Hospital Stay (HOSPITAL_COMMUNITY): Payer: 59 | Admitting: Anesthesiology

## 2021-09-14 ENCOUNTER — Encounter (HOSPITAL_COMMUNITY)
Admission: EM | Disposition: A | Discharge status: Skilled Nursing Facility | Payer: Self-pay | Source: Home / Self Care | Attending: Family Medicine

## 2021-09-14 ENCOUNTER — Encounter (HOSPITAL_COMMUNITY): Payer: Self-pay | Admitting: Family Medicine

## 2021-09-14 DIAGNOSIS — L03012 Cellulitis of left finger: Secondary | ICD-10-CM

## 2021-09-14 HISTORY — PX: AMPUTATION: SHX166

## 2021-09-14 LAB — AEROBIC/ANAEROBIC CULTURE W GRAM STAIN (SURGICAL/DEEP WOUND): Gram Stain: NONE SEEN

## 2021-09-14 LAB — GLUCOSE, CAPILLARY
Glucose-Capillary: 274 mg/dL — ABNORMAL HIGH (ref 70–99)
Glucose-Capillary: 166 mg/dL — ABNORMAL HIGH (ref 70–99)
Glucose-Capillary: 155 mg/dL — ABNORMAL HIGH (ref 70–99)
Glucose-Capillary: 170 mg/dL — ABNORMAL HIGH (ref 70–99)
Glucose-Capillary: 156 mg/dL — ABNORMAL HIGH (ref 70–99)
Glucose-Capillary: 155 mg/dL — ABNORMAL HIGH (ref 70–99)

## 2021-09-14 LAB — SURGICAL PCR SCREEN
MRSA, PCR: NEGATIVE
Staphylococcus aureus: NEGATIVE

## 2021-09-14 SURGERY — AMPUTATION DIGIT
Anesthesia: Monitor Anesthesia Care | Laterality: Left

## 2021-09-14 MED ORDER — MORPHINE SULFATE (PF) 2 MG/ML IV SOLN: 1.0000 mg | Freq: Once | INTRAVENOUS | Status: DC

## 2021-09-14 MED ORDER — LACTATED RINGERS IV SOLN: INTRAVENOUS | Status: DC

## 2021-09-14 MED ORDER — ONDANSETRON HCL 4 MG/2ML IJ SOLN
INTRAMUSCULAR | Status: AC
  Filled 2021-09-14: qty 2

## 2021-09-14 MED ORDER — OXYCODONE HCL 5 MG/5ML PO SOLN: 5.0000 mg | Freq: Once | ORAL | Status: DC | PRN

## 2021-09-14 MED ORDER — PROPOFOL 10 MG/ML IV BOLUS
INTRAVENOUS | Status: AC
  Filled 2021-09-14: qty 20

## 2021-09-14 MED ORDER — CHLORHEXIDINE GLUCONATE 0.12 % MT SOLN
15.0000 mL | Freq: Once | OROMUCOSAL | Status: AC
  Administered 2021-09-14: 15 mL via OROMUCOSAL

## 2021-09-14 MED ORDER — CHLORHEXIDINE GLUCONATE CLOTH 2 % EX PADS
6.0000 | MEDICATED_PAD | Freq: Once | CUTANEOUS | Status: AC
  Administered 2021-09-14: 6 via TOPICAL

## 2021-09-14 MED ORDER — ONDANSETRON HCL 4 MG/2ML IJ SOLN
INTRAMUSCULAR | Status: DC | PRN
  Administered 2021-09-14: 4 mg via INTRAVENOUS

## 2021-09-14 MED ORDER — APIXABAN 5 MG PO TABS
5.0000 mg | ORAL_TABLET | Freq: Two times a day (BID) | ORAL | Status: DC
  Administered 2021-09-15 – 2021-09-19 (×10): 5 mg via ORAL
  Filled 2021-09-14 (×10): qty 1

## 2021-09-14 MED ORDER — ACETAMINOPHEN 10 MG/ML IV SOLN
1000.0000 mg | INTRAVENOUS | Status: AC
  Administered 2021-09-14: 1000 mg via INTRAVENOUS

## 2021-09-14 MED ORDER — PROPOFOL 1000 MG/100ML IV EMUL
INTRAVENOUS | Status: AC
  Filled 2021-09-14: qty 100

## 2021-09-14 MED ORDER — FENTANYL CITRATE PF 50 MCG/ML IJ SOSY: 50.0000 ug | PREFILLED_SYRINGE | INTRAMUSCULAR | Status: DC

## 2021-09-14 MED ORDER — ROPIVACAINE HCL 5 MG/ML IJ SOLN
INTRAMUSCULAR | Status: DC | PRN
  Administered 2021-09-14: 25 mL via PERINEURAL

## 2021-09-14 MED ORDER — ACETAMINOPHEN 10 MG/ML IV SOLN
INTRAVENOUS | Status: AC
  Filled 2021-09-14: qty 100

## 2021-09-14 MED ORDER — CHLORHEXIDINE GLUCONATE CLOTH 2 % EX PADS: 6.0000 | MEDICATED_PAD | Freq: Once | CUTANEOUS | Status: DC

## 2021-09-14 MED ORDER — PROPOFOL 500 MG/50ML IV EMUL
INTRAVENOUS | Status: DC | PRN
  Administered 2021-09-14: 50 ug/kg/min via INTRAVENOUS

## 2021-09-14 MED ORDER — OXYCODONE HCL 5 MG PO TABS: 5.0000 mg | ORAL_TABLET | Freq: Once | ORAL | Status: DC | PRN

## 2021-09-14 MED ORDER — ACETAMINOPHEN 500 MG PO TABS
1000.0000 mg | ORAL_TABLET | Freq: Once | ORAL | Status: DC
  Filled 2021-09-14: qty 2

## 2021-09-14 MED ORDER — FENTANYL CITRATE PF 50 MCG/ML IJ SOSY
PREFILLED_SYRINGE | INTRAMUSCULAR | Status: AC
  Administered 2021-09-14: 50 ug via INTRAVENOUS
  Filled 2021-09-14: qty 1

## 2021-09-14 MED ORDER — FENTANYL CITRATE PF 50 MCG/ML IJ SOSY: 25.0000 ug | PREFILLED_SYRINGE | INTRAMUSCULAR | Status: DC | PRN

## 2021-09-14 MED ORDER — PHENYLEPHRINE 40 MCG/ML (10ML) SYRINGE FOR IV PUSH (FOR BLOOD PRESSURE SUPPORT)
PREFILLED_SYRINGE | INTRAVENOUS | Status: DC | PRN
Start: 2021-09-14 — End: 2021-09-14
  Administered 2021-09-14: 80 ug via INTRAVENOUS

## 2021-09-14 SURGICAL SUPPLY — 46 items
BAG COUNTER SPONGE SURGICOUNT (BAG) ×1 IMPLANT
BNDG COHESIVE 1X5 TAN STRL LF (GAUZE/BANDAGES/DRESSINGS) ×1 IMPLANT
BNDG CONFORM 2 STRL LF (GAUZE/BANDAGES/DRESSINGS) IMPLANT
BNDG ELASTIC 2X5.8 VLCR STR LF (GAUZE/BANDAGES/DRESSINGS) ×2 IMPLANT
BNDG ELASTIC 3X5.8 VLCR STR LF (GAUZE/BANDAGES/DRESSINGS) IMPLANT
BNDG ELASTIC 4X5.8 VLCR STR LF (GAUZE/BANDAGES/DRESSINGS) ×1 IMPLANT
BNDG GAUZE ELAST 4 BULKY (GAUZE/BANDAGES/DRESSINGS) ×2 IMPLANT
CNTNR URN SCR LID CUP LEK RST (MISCELLANEOUS) ×1 IMPLANT
CONT SPEC 4OZ STRL OR WHT (MISCELLANEOUS)
CORD BIPOLAR FORCEPS 12FT (ELECTRODE) ×1 IMPLANT
COVER SURGICAL LIGHT HANDLE (MISCELLANEOUS) ×2 IMPLANT
CUFF TOURN SGL QUICK 18X4 (TOURNIQUET CUFF) ×2 IMPLANT
CUFF TOURN SGL QUICK 24 (TOURNIQUET CUFF)
CUFF TRNQT CYL 24X4X16.5-23 (TOURNIQUET CUFF) IMPLANT
DRAPE SURG 17X23 STRL (DRAPES) ×2 IMPLANT
DRSG ADAPTIC 3X8 NADH LF (GAUZE/BANDAGES/DRESSINGS) ×1 IMPLANT
GAUZE SPONGE 2X2 8PLY STRL LF (GAUZE/BANDAGES/DRESSINGS) IMPLANT
GAUZE SPONGE 4X4 12PLY STRL (GAUZE/BANDAGES/DRESSINGS) ×1 IMPLANT
GLOVE SURG ORTHO LTX SZ8 (GLOVE) ×2 IMPLANT
GLOVE SURG UNDER POLY LF SZ8.5 (GLOVE) ×2 IMPLANT
GOWN STRL REUS W/ TWL LRG LVL3 (GOWN DISPOSABLE) ×2 IMPLANT
GOWN STRL REUS W/ TWL XL LVL3 (GOWN DISPOSABLE) ×1 IMPLANT
GOWN STRL REUS W/TWL LRG LVL3 (GOWN DISPOSABLE) ×4
GOWN STRL REUS W/TWL XL LVL3 (GOWN DISPOSABLE)
JET LAVAGE IRRISEPT WOUND (IRRIGATION / IRRIGATOR) ×2
KIT BASIN OR (CUSTOM PROCEDURE TRAY) ×2 IMPLANT
LAVAGE JET IRRISEPT WOUND (IRRIGATION / IRRIGATOR) IMPLANT
MANIFOLD NEPTUNE II (INSTRUMENTS) ×2 IMPLANT
NDL HYPO 25X1 1.5 SAFETY (NEEDLE) IMPLANT
NEEDLE HYPO 25X1 1.5 SAFETY (NEEDLE) IMPLANT
NS IRRIG 1000ML POUR BTL (IV SOLUTION) ×2 IMPLANT
PACK ORTHO EXTREMITY (CUSTOM PROCEDURE TRAY) ×2 IMPLANT
PAD ARMBOARD 7.5X6 YLW CONV (MISCELLANEOUS) ×4 IMPLANT
PAD CAST 4YDX4 CTTN HI CHSV (CAST SUPPLIES) IMPLANT
PADDING CAST COTTON 4X4 STRL (CAST SUPPLIES)
SOAP 2 % CHG 4 OZ (WOUND CARE) ×1 IMPLANT
SPONGE GAUZE 2X2 STER 10/PKG (GAUZE/BANDAGES/DRESSINGS)
SUCTION FRAZIER HANDLE 10FR (MISCELLANEOUS)
SUCTION TUBE FRAZIER 10FR DISP (MISCELLANEOUS) IMPLANT
SUT MERSILENE 4 0 P 3 (SUTURE) IMPLANT
SUT PROLENE 4 0 PS 2 18 (SUTURE) IMPLANT
SYR CONTROL 10ML LL (SYRINGE) IMPLANT
TOWEL OR 17X26 10 PK STRL BLUE (TOWEL DISPOSABLE) ×2 IMPLANT
TOWEL OR NON WOVEN STRL DISP B (DISPOSABLE) ×2 IMPLANT
TUBING CONNECTING 10 (TUBING) IMPLANT
WATER STERILE IRR 1000ML POUR (IV SOLUTION) ×2 IMPLANT

## 2021-09-14 NOTE — Progress Notes (Signed)
Patient underwent ray amputation left long finger today for progressing infection with gangrene.  The distal aspect was left open and was packed with gauze.  Would recommend continuing to elevate the hand and change the packing daily.  We will plan to follow along.  Additional cultures were taken today which could be monitored while he is here.  Call with any questions.

## 2021-09-14 NOTE — Interval H&P Note (Signed)
History and Physical Interval Note:  09/14/2021 5:41 PM  Mandrell D Lunz  has presented today for surgery, with the diagnosis of infected left long finger.  The various methods of treatment have been discussed with the patient and family. After consideration of risks, benefits and other options for treatment, the patient has consented to  Procedure(s): AMPUTATION DIGIT left long finger and irrigation and debridement (Left) as a surgical intervention.  [name] history has been reviewed, patient examined, no change in status, stable for surgery.  I have reviewed the patient's chart and labs.  Questions were answered to the patient's satisfaction.     Allena [place]

## 2021-09-14 NOTE — Progress Notes (Signed)
4 Days Post-Op  Subjective: Resting in bed, no distress. Reports some pain to hand, denies subjective fevers/chills.    Objective: Vital signs in last 24 hours: Temp:  [97.8 F (36.6 C)-98.1 F (36.7 C)] 98.1 F (36.7 C) (12/09 0456) Pulse Rate:  [75-99] 99 (12/09 0456) Resp:  [17-20] 20 (12/09 0456) BP: (134-143)/(72-88) 143/88 (12/09 0456) SpO2:  [98 %-100 %] 98 % (12/09 0456) Last BM Date: 09/11/21  Intake/Output from previous day: 12/08 0701 - 12/09 0700 In: 2594.9 [P.O.:2220; I.V.:224.9; IV Piggyback:150] Out: 2250 [Urine:2250] Intake/Output this shift: No intake/output data recorded.  General appearance: alert, cooperative, and no distress Resp: unlabored Extremities: left long finger s/p amputation at PIP joint. Swelling has worsened. There is purulent drainage from the base of the left long finger. Erythema spreading into hand. Increased tenderness over left longer finger amputation stump, extending into hand. No fluid collection or specific area of fluctuance palpated. Radial pulse is palpable.          Lab Results:  CBC Latest Ref Rng & Units 09/13/2021 09/12/2021 09/11/2021  WBC 4.0 - 10.5 K/uL 10.5 13.3(H) 16.3(H)  Hemoglobin 13.0 - 17.0 g/dL 13.4 12.4(L) 12.8(L)  Hematocrit 39.0 - 52.0 % 39.1 38.6(L) 38.3(L)  Platelets 150 - 400 K/uL 260 277 283    BMET Recent Labs    09/12/21 0829 09/13/21 0342  NA 134* 133*  K 4.1 4.3  CL 101 100  CO2 25 25  GLUCOSE 202* 271*  BUN 13 14  CREATININE 0.68 0.69  CALCIUM 8.4* 8.4*   PT/INR No results for input(s): LABPROT, INR in the last 72 hours. ABG No results for input(s): PHART, HCO3 in the last 72 hours.  Invalid input(s): PCO2, PO2  Studies/Results: No results found.  Anti-infectives: Anti-infectives (From admission, onward)    Start     Dose/Rate Route Frequency Ordered Stop   09/10/21 1300  ceFAZolin (ANCEF) IVPB 2g/100 mL premix  Status:  Discontinued        2 g 200 mL/hr over 30 Minutes  Intravenous On call to O.R. 09/10/21 1236 09/10/21 1843   09/10/21 0600  ceFAZolin (ANCEF) IVPB 1 g/50 mL premix        1 g 100 mL/hr over 30 Minutes Intravenous Every 8 hours 09/09/21 1342     09/07/21 0900  cefTRIAXone (ROCEPHIN) 2 g in sodium chloride 0.9 % 100 mL IVPB  Status:  Discontinued        2 g 200 mL/hr over 30 Minutes Intravenous Every 24 hours 09/06/21 1716 09/09/21 1342   09/07/21 0630  cefTRIAXone (ROCEPHIN) 2 g in sodium chloride 0.9 % 100 mL IVPB  Status:  Discontinued        2 g 200 mL/hr over 30 Minutes Intravenous  Once 09/06/21 1709 09/06/21 1716   09/06/21 2000  vancomycin (VANCOREADY) IVPB 1750 mg/350 mL  Status:  Discontinued        1,750 mg 175 mL/hr over 120 Minutes Intravenous Every 12 hours 09/06/21 0635 09/08/21 0936   09/06/21 0630  cefTRIAXone (ROCEPHIN) 2 g in sodium chloride 0.9 % 100 mL IVPB        2 g 200 mL/hr over 30 Minutes Intravenous  Once 09/06/21 0617 09/06/21 0921   09/06/21 0630  vancomycin (VANCOREADY) IVPB 2000 mg/400 mL        2,000 mg 200 mL/hr over 120 Minutes Intravenous  Once 09/06/21 0622 09/06/21 1120       Assessment/Plan: s/p Procedure(s): AMPUTATION DIGIT IRRIGATION AND DEBRIDEMENT OF LEFT  LONG FINGER WOUND  Plan for further amputation of left long finger and I&D with Dr. Arita Miss this afternoon. Patient made NPO. Discussed risks with patient, all of his questions answered to his content. Dressing changed today by me.   Continue IV antibiotics, will discuss taking additional cultures today for susceptibilities.    LOS: 8 days    Leslee Home, PA-C 09/14/2021

## 2021-09-14 NOTE — Progress Notes (Signed)
Pts son was notified that the patient will be moved to the 6th floor after surgery.  He will let his sisters know.

## 2021-09-14 NOTE — Care Management Important Message (Signed)
Important Message  Patient Details IM Letter given to the Patient. Name: Austin [name] MRN: [id number] Date of Birth: [birth year]   Medicare Important Message Given:  Yes     Caren Macadam 09/14/2021, 1:38 PM

## 2021-09-14 NOTE — Progress Notes (Signed)
PROGRESS NOTE    Austin [name]  NWG:[id number] DOB: 1956/05/23 DOA: 09/05/2021  PCP: Preston Fleeting, MD   Brief Narrative:  This 65 years old male with PMH significant for CAD, diabetes type 2, history of DVT, GERD, hyperlipidemia, hypertension, peripheral vascular disease presented in the ED with progressive swelling,  redness of the left middle finger with left hand edema for 5 to 6 days.  Patient has initially developed an ulcer on the tip of left middle finger without an obvious trauma and symptoms continued to get worse.  X-ray of the left hand did not show any fracture or dislocation but soft tissue swelling.  Patient was started on vancomycin and ceftriaxone and admitted for further evaluation.  Plastic surgery was consulted,  Patient underwent amputation of the left middle finger.  Assessment & Plan:   Principal Problem:   Infection of left hand Active Problems:   Essential hypertension   Hyperlipidemia   Type 2 diabetes mellitus with diabetic peripheral angiopathy without gangrene (HCC)   Coronary artery disease   Lactic acidosis   Peripheral vascular disease (HCC)   Pressure injury of coccygeal region, stage 1  Left middle finger infection with cellulitis of left dorsal hand: Patient was seen by plastic surgery and underwent incision and drainage at bedside in the ED.   Status post Left long finger amputation at the PIP joint and incision and drainage of the flexor tendon sheath on 09/10/2021.  Patient remains afebrile, Leucocytosis trending down.  Continue IV cefazolin, adequate pain control with oxycodone. Follow orthopedic recommendations.  Cultures so far no growth. Continue local wound care with daily xeroform, 4 x 4 guaze, ace wrap. After dressing change it was noted the swelling has worsened, there was a purulent drainage noted. Plastic surgery has recommended further amputation of left long finger and incision and drainage with Dr. Arita Miss today.    Essential hypertension: Continue losartan. Continue as needed hydralazine.  Hyperlipidemia: Continue Lipitor.  Coronary artery disease: Continue aspirin, losartan and statin.  Type 2 diabetes with hyperglycemia: Hemoglobin A1c 12.2.  Poorly controlled Continue Semglee 35 units daily Continue regular insulin sliding scale. Dietitian consult   Pressure injury of coccygeal region, stage 1 POA Continue wound care protocol. Continue prevention, frequent posture change every 4 hours    DVT prophylaxis: Eliquis Code Status: Full code Family Communication: No family at bedside Disposition Plan:   Status is: Inpatient  Remains inpatient appropriate because: Infection of the left middle finger with necrosis requiring debridement and IV antibiotics.  Patient is requiring further amputation of the left long finger.  Consultants:  Orthopedics  Procedures:  Left long finger amputation at the PIP joint. Incision and drainage of the flexor tendon sheath left long finger   Antimicrobials:  Anti-infectives (From admission, onward)    Start     Dose/Rate Route Frequency Ordered Stop   09/10/21 1300  ceFAZolin (ANCEF) IVPB 2g/100 mL premix  Status:  Discontinued        2 g 200 mL/hr over 30 Minutes Intravenous On call to O.R. 09/10/21 1236 09/10/21 1843   09/10/21 0600  ceFAZolin (ANCEF) IVPB 1 g/50 mL premix        1 g 100 mL/hr over 30 Minutes Intravenous Every 8 hours 09/09/21 1342     09/07/21 0900  cefTRIAXone (ROCEPHIN) 2 g in sodium chloride 0.9 % 100 mL IVPB  Status:  Discontinued        2 g 200 mL/hr over 30 Minutes Intravenous Every 24 hours  09/06/21 1716 09/09/21 1342   09/07/21 0630  cefTRIAXone (ROCEPHIN) 2 g in sodium chloride 0.9 % 100 mL IVPB  Status:  Discontinued        2 g 200 mL/hr over 30 Minutes Intravenous  Once 09/06/21 1709 09/06/21 1716   09/06/21 2000  vancomycin (VANCOREADY) IVPB 1750 mg/350 mL  Status:  Discontinued        1,750 mg 175 mL/hr over  120 Minutes Intravenous Every 12 hours 09/06/21 0635 09/08/21 0936   09/06/21 0630  cefTRIAXone (ROCEPHIN) 2 g in sodium chloride 0.9 % 100 mL IVPB        2 g 200 mL/hr over 30 Minutes Intravenous  Once 09/06/21 0617 09/06/21 0921   09/06/21 0630  vancomycin (VANCOREADY) IVPB 2000 mg/400 mL        2,000 mg 200 mL/hr over 120 Minutes Intravenous  Once 09/06/21 0622 09/06/21 1120       Subjective: Patient was seen and examined at bedside.  Overnight events noted.   Patient reports the pain in the left long finger has worsened.  He reports feeling tightness in that area. His left hand is covered in dressing.   Objective: Vitals:   09/13/21 0540 09/13/21 1321 09/13/21 2037 09/14/21 0456  BP: 133/73 139/79 134/72 (!) 143/88  Pulse: 71 79 75 99  Resp: 16 17 18 20   Temp: 98.2 F (36.8 C) 97.8 F (36.6 C) 98.1 F (36.7 C) 98.1 F (36.7 C)  TempSrc: Oral  Oral Oral  SpO2: 99% 100% 98% 98%  Weight:      Height:        Intake/Output Summary (Last 24 hours) at 09/14/2021 1124 Last data filed at 09/14/2021 0945 Gross per 24 hour  Intake 1934.87 ml  Output 2500 ml  Net -565.13 ml   Filed Weights   09/06/21 0607 09/06/21 0744  Weight: 111.1 kg 110.7 kg    Examination:  General exam: Appears comfortable but remains in a lot of pain.  Deconditioned Respiratory system: Clear to auscultation bilaterally. Respiratory effort normal.  RR 15 Cardiovascular system: S1-S2 heard, regular rate and rhythm, no murmur. Gastrointestinal system: Abdomen is soft, nontender, nondistended, BS+ Central nervous system: Alert and oriented x 3. No focal neurological deficits. Extremities: Left hand middle finger amputated,  remained in dressing. Skin: No rashes, lesions or ulcers Psychiatry: Judgement and insight appear normal. Mood & affect appropriate.     Data Reviewed: I have personally reviewed following labs and imaging studies  CBC: Recent Labs  Lab 09/09/21 0330 09/10/21 0526  09/11/21 0340 09/12/21 0829 09/13/21 0342  WBC 13.3* 16.7* 16.3* 13.3* 10.5  HGB 13.6 14.7 12.8* 12.4* 13.4  HCT 40.0 42.8 38.3* 38.6* 39.1  MCV 83.0 82.8 83.4 85.6 82.7  PLT 263 268 283 277 [id number]   Basic Metabolic Panel: Recent Labs  Lab 09/09/21 0330 09/10/21 0526 09/11/21 0340 09/12/21 0829 09/13/21 0342  NA 131* 130* 130* 134* 133*  K 4.1 4.7 4.2 4.1 4.3  CL 101 99 98 101 100  CO2 23 23 24 25 25   GLUCOSE 263* 276* 282* 202* 271*  BUN 10 9 13 13 14   CREATININE 0.75 0.86 0.76 0.68 0.69  CALCIUM 8.5* 8.8* 8.6* 8.4* 8.4*  MG 1.8  --  2.0  --  1.9  PHOS  --   --   --   --  2.8   GFR: Estimated Creatinine Clearance: 123.8 mL/min (by C-G formula based on SCr of 0.69 mg/dL). Liver Function Tests: No results  for input(s): AST, ALT, ALKPHOS, BILITOT, PROT, ALBUMIN in the last 168 hours.  No results for input(s): LIPASE, AMYLASE in the last 168 hours. No results for input(s): AMMONIA in the last 168 hours. Coagulation Profile: No results for input(s): INR, PROTIME in the last 168 hours. Cardiac Enzymes: No results for input(s): CKTOTAL, CKMB, CKMBINDEX, TROPONINI in the last 168 hours. BNP (last 3 results) No results for input(s): PROBNP in the last 8760 hours. HbA1C: No results for input(s): HGBA1C in the last 72 hours. CBG: Recent Labs  Lab 09/13/21 0728 09/13/21 1137 09/13/21 1655 09/13/21 2035 09/14/21 0739  GLUCAP 235* 273* 270* 294* 274*   Lipid Profile: No results for input(s): CHOL, HDL, LDLCALC, TRIG, CHOLHDL, LDLDIRECT in the last 72 hours. Thyroid Function Tests: No results for input(s): TSH, T4TOTAL, FREET4, T3FREE, THYROIDAB in the last 72 hours. Anemia Panel: No results for input(s): VITAMINB12, FOLATE, FERRITIN, TIBC, IRON, RETICCTPCT in the last 72 hours. Sepsis Labs: No results for input(s): PROCALCITON, LATICACIDVEN in the last 168 hours.   Recent Results (from the past 240 hour(s))  Resp Panel by RT-PCR (Flu A&B, Covid) Nasopharyngeal Swab      Status: None   Collection Time: 09/06/21  6:16 AM   Specimen: Nasopharyngeal Swab; Nasopharyngeal(NP) swabs in vial transport medium  Result Value Ref Range Status   SARS Coronavirus 2 by RT PCR NEGATIVE NEGATIVE Final    Comment: (NOTE) SARS-CoV-2 target nucleic acids are NOT DETECTED.  The SARS-CoV-2 RNA is generally detectable in upper respiratory specimens during the acute phase of infection. The lowest concentration of SARS-CoV-2 viral copies this assay can detect is 138 copies/mL. A negative result does not preclude SARS-Cov-2 infection and should not be used as the sole basis for treatment or other patient management decisions. A negative result may occur with  improper specimen collection/handling, submission of specimen other than nasopharyngeal swab, presence of viral mutation(s) within the areas targeted by this assay, and inadequate number of viral copies(<138 copies/mL). A negative result must be combined with clinical observations, patient history, and epidemiological information. The expected result is Negative.  Fact Sheet for Patients:  EntrepreneurPulse.com.au  Fact Sheet for Healthcare Providers:  IncredibleEmployment.be  This test is no t yet approved or cleared by the [place] FDA and  has been authorized for detection and/or diagnosis of SARS-CoV-2 by FDA under an Emergency Use Authorization (EUA). This EUA will remain  in effect (meaning this test can be used) for the duration of the COVID-19 declaration under Section 564(b)(1) of the Act, 21 U.S.C.section 360bbb-3(b)(1), unless the authorization is terminated  or revoked sooner.       Influenza A by PCR NEGATIVE NEGATIVE Final   Influenza B by PCR NEGATIVE NEGATIVE Final    Comment: (NOTE) The Xpert Xpress SARS-CoV-2/FLU/RSV plus assay is intended as an aid in the diagnosis of influenza from Nasopharyngeal swab specimens and should not be used as a sole basis  for treatment. Nasal washings and aspirates are unacceptable for Xpert Xpress SARS-CoV-2/FLU/RSV testing.  Fact Sheet for Patients: EntrepreneurPulse.com.au  Fact Sheet for Healthcare Providers: IncredibleEmployment.be  This test is not yet approved or cleared by the [place] FDA and has been authorized for detection and/or diagnosis of SARS-CoV-2 by FDA under an Emergency Use Authorization (EUA). This EUA will remain in effect (meaning this test can be used) for the duration of the COVID-19 declaration under Section 564(b)(1) of the Act, 21 U.S.C. section 360bbb-3(b)(1), unless the authorization is terminated or revoked.  Performed at  [facility], [place] [address] [place], [place] [address]   Aerobic Culture w Gram Stain (superficial specimen)     Status: None   Collection Time: 09/06/21  8:56 AM   Specimen: Wound  Result Value Ref Range Status   Specimen Description   Final    WOUND LT FINGER Performed at [place] [address] [place], [place] [address]    Special Requests   Final    Normal Performed at [facility], [place] [address] [place], [place] [address]    Gram Stain   Final    MODERATE WBC PRESENT,BOTH PMN AND MONONUCLEAR ABUNDANT GRAM POSITIVE COCCI ABUNDANT GRAM NEGATIVE RODS FEW GRAM VARIABLE ROD    Culture   Final    ABUNDANT STREPTOCOCCUS AGALACTIAE TESTING AGAINST S. AGALACTIAE NOT ROUTINELY PERFORMED DUE TO PREDICTABILITY OF AMP/PEN/VAN SUSCEPTIBILITY. RARE STAPHYLOCOCCUS AUREUS WITHIN MIXED ORGANISMS Performed at [facility], [place] [address], [place], [place] [address]    Report Status 09/09/2021 FINAL  Final   Organism ID, Bacteria STAPHYLOCOCCUS AUREUS  Final      Susceptibility   Staphylococcus aureus - MIC*    CIPROFLOXACIN <=0.5 SENSITIVE Sensitive     ERYTHROMYCIN <=0.25 SENSITIVE Sensitive     GENTAMICIN <=0.5 SENSITIVE Sensitive      OXACILLIN <=0.25 SENSITIVE Sensitive     TETRACYCLINE <=1 SENSITIVE Sensitive     VANCOMYCIN <=0.5 SENSITIVE Sensitive     TRIMETH/SULFA <=10 SENSITIVE Sensitive     CLINDAMYCIN <=0.25 SENSITIVE Sensitive     RIFAMPIN <=0.5 SENSITIVE Sensitive     Inducible Clindamycin NEGATIVE Sensitive     * RARE STAPHYLOCOCCUS AUREUS  Aerobic/Anaerobic Culture w Gram Stain (surgical/deep wound)     Status: None (Preliminary result)   Collection Time: 09/10/21  5:27 PM   Specimen: PATH Soft tissue  Result Value Ref Range Status   Specimen Description   Final    TISSUE LEFT LONG FINGER Performed at [place] [address] [place], [place] [address]    Special Requests   Final    NONE Performed at [facility], [place] [address] [place], [place] [address]    Gram Stain   Final    NO SQUAMOUS EPITHELIAL CELLS SEEN NO WBC SEEN NO ORGANISMS SEEN    Culture   Final    CULTURE REINCUBATED FOR BETTER GROWTH Performed at [facility], [place] [address], [place], [place] [address]    Report Status PENDING  Incomplete    Radiology Studies: No results found.  Scheduled Meds:  [START ON 09/15/2021] apixaban  5 mg Oral BID   aspirin EC  81 mg Oral Daily   atorvastatin  80 mg Oral q1800   docusate sodium  100 mg Oral BID   insulin aspart  0-20 Units Subcutaneous TID WC   insulin aspart  0-5 Units Subcutaneous QHS   insulin aspart  5 Units Subcutaneous TID WC   insulin glargine-yfgn  35 Units Subcutaneous QHS   losartan  25 mg Oral Daily   nystatin   Topical TID   polyethylene glycol  17 g Oral Daily   Continuous Infusions:  sodium chloride 10 mL/hr at 09/13/21 1225    ceFAZolin (ANCEF) IV 1 g (09/14/21 0514)     LOS: 8 days    Time spent: 25 mins    Alija Riano, MD [facility]   If 7PM-7AM, please contact night-coverage

## 2021-09-14 NOTE — TOC Progression Note (Addendum)
Transition of Care [facility]) - Progression Note   Patient Details  Name: Austin [name] MRN: [id number] Date of Birth: [birth year]  [facility]) CM/SW [place], LCSW Phone Number: 09/14/2021, 11:20 AM  Clinical Narrative: CSW met with patient to confirm bed choice and patient is requesting [facility]. Patient requested that CSW follow up with his sister regarding bed choice. CSW spoke with sister who also requested [facility] so that she can more easily visit the patient. CSW confirmed with Tammy at [facility] that the bed is still available and the facility can admit the patient on Monday.  CSW called Bernadene Bell to start insurance authorization for SNF. Reference ID is: [id number]. Clinicals uploaded to [facility] for review. TOC awaiting insurance approval.  Addendum: CSW received insurance authorization for SNF. Patient has been approved for 09/17/2021-09/21/2021.  Expected Discharge Plan: [place] Barriers to Discharge: Continued Medical Work up, SNF Pending bed offer  Expected Discharge Plan and Services Expected Discharge Plan: [place] In-house Referral: Clinical Social Work Post Acute Care Choice: [place] Living arrangements for the past 2 months: Mobile Home           DME Arranged: N/A DME Agency: NA  Readmission Risk Interventions No flowsheet data found.

## 2021-09-14 NOTE — Transfer of Care (Signed)
Immediate Anesthesia Transfer of Care Note  Patient: Austin [name]  Procedure(s) Performed: AMPUTATION DIGIT left long finger and irrigation and debridement (Left)  Patient Location: PACU  Anesthesia Type:MAC  Level of Consciousness: awake, alert  and oriented  Airway & Oxygen Therapy: Patient Spontanous Breathing and Patient connected to face mask oxygen  Post-op Assessment: Report given to RN and Post -op Vital signs reviewed and stable  Post vital signs: Reviewed and stable  Last Vitals:  Vitals Value Taken Time  BP    Temp    Pulse 87 09/14/21 1909  Resp 19 09/14/21 1909  SpO2 100 % 09/14/21 1909  Vitals shown include unvalidated device data.  Last Pain:  Vitals:   09/14/21 1349  TempSrc: Oral  PainSc:       Patients Stated Pain Goal: 4 (09/38/18 2993)  Complications: No notable events documented.

## 2021-09-14 NOTE — Progress Notes (Signed)
Physical Therapy Treatment Patient Details Name: Austin [name] MRN: [id number] DOB: 1955/11/12 Today's Date: 09/14/2021   History of Present Illness 65 y.o. male admitted to hospital with progressive swelling redness of the left middle finger with left hand edema. X-ray of the left hand did not show any fracture or dislocation but soft tissue swelling pt is s/p L long finger I&D on 12/2 at bedside, to return to OR for second I&D 12/5   PMH: coronary artery disease, diabetes mellitus type 2, history of DVT, GERD, hyperlipidemia, hypertension and peripheral vascular disease, L BKA    PT Comments    Pt somewhat lethargic today, but did arouse to verbal stimuli. Assisted pt to sit edge of bed x 10 minutes where he worked on sitting balance and BLE strengthening exercises. Pt initially unsteady in sitting and required assistance for trunk control. Pt is oriented to self but not to situation, oriented to month but not to year. He is able to follow 1 step commands.     Recommendations for follow up therapy are one component of a multi-disciplinary discharge planning process, led by the attending physician.  Recommendations may be updated based on patient status, additional functional criteria and insurance authorization.  Follow Up Recommendations  Skilled nursing-short term rehab (<3 hours/day)     Assistance Recommended at Discharge Frequent or constant Supervision/Assistance  Equipment Recommendations  None recommended by PT    Recommendations for Other Services       Precautions / Restrictions Precautions Precautions: Fall Precaution Comments: left hand infection Restrictions LUE Weight Bearing: Non weight bearing Other Position/Activity Restrictions: pt reports he hasn't worn L prosthesis for "awhile" due to a sore on his leg     Mobility  Bed Mobility Overal bed mobility: Needs Assistance Bed Mobility: Sit to Supine;Supine to Sit     Supine to sit: Max assist;HOB  elevated Sit to supine: Min assist   General bed mobility comments: increased time, used bedrail, assist to raise trunk and then to steady once in sitting; assist for BLEs into bed. Sat EOB x 10 minutes and worked on sitting balance, forward reaching.    Transfers                   General transfer comment: deferred 2* pt leaving for surgery soon    Ambulation/Gait                   Stairs             Wheelchair Mobility    Modified Rankin (Stroke Patients Only)       Balance Overall balance assessment: Needs assistance Sitting-balance support: Single extremity supported;Feet supported Sitting balance-Leahy Scale: Poor Sitting balance - Comments: initially poor requiring min/mod assist for static sitting (trunk sway in multiple directions), then fair. Performed forward and lateral reaching. Sat EOB x 10 minutes. Postural control: Right lateral lean;Left lateral lean                                  Cognition Arousal/Alertness: Lethargic Behavior During Therapy: WFL for tasks assessed/performed Overall Cognitive Status: No family/caregiver present to determine baseline cognitive functioning Area of Impairment: Orientation;Following commands;Problem solving                 Orientation Level: Disoriented to;Place;Situation;Time     Following Commands: Follows one step commands inconsistently;Follows multi-step commands inconsistently     Problem Solving: Slow  processing;Decreased initiation;Difficulty sequencing;Requires verbal cues;Requires tactile cues General Comments: oriented to self, stated month correctly but stated year is "2001 or 2002", not able to state reason for being in hospital, can follow 1 step commands        Exercises General Exercises - Lower Extremity Long Arc Quad: AROM;AAROM;Both;10 reps;Seated Hip Flexion/Marching: AROM;Left;5 reps (attempted on R however pt had to assist lifting RLE with his RUE)     General Comments        Pertinent Vitals/Pain Faces Pain Scale: Hurts little more Pain Location: L hand Pain Descriptors / Indicators: Throbbing Pain Intervention(s): Limited activity within patient's tolerance;Monitored during session;Patient requesting pain meds-RN notified;Repositioned    Home Living                          Prior Function            PT Goals (current goals can now be found in the care plan section) Acute Rehab PT Goals PT Goal Formulation: Patient unable to participate in goal setting Time For Goal Achievement: 09/24/21 Potential to Achieve Goals: Fair Progress towards PT goals: Progressing toward goals    Frequency    Min 2X/week      PT Plan Current plan remains appropriate    Co-evaluation              AM-PAC PT "6 Clicks" Mobility   Outcome Measure  Help needed turning from your back to your side while in a flat bed without using bedrails?: A Little Help needed moving from lying on your back to sitting on the side of a flat bed without using bedrails?: A Little Help needed moving to and from a bed to a chair (including a wheelchair)?: Total Help needed standing up from a chair using your arms (e.g., wheelchair or bedside chair)?: Total Help needed to walk in hospital room?: Total Help needed climbing 3-5 steps with a railing? : Total 6 Click Score: 10    End of Session   Activity Tolerance: Patient tolerated treatment well Patient left: in bed;with call bell/phone within reach;with bed alarm set Nurse Communication: Mobility status;Patient requests pain meds;Need for lift equipment PT Visit Diagnosis: Other abnormalities of gait and mobility (R26.89)     Time: [id number] PT Time Calculation (min) (ACUTE ONLY): 16 min  Charges:  $Therapeutic Activity: 8-22 mins                    Blondell Reveal Kistler PT 09/14/2021  Acute Rehabilitation Services Pager [phone] Office [phone]

## 2021-09-14 NOTE — Anesthesia Preprocedure Evaluation (Addendum)
Anesthesia Evaluation  Patient identified by MRN, date of birth, ID band Patient awake    Reviewed: Allergy & Precautions, Patient's Chart, lab work & pertinent test results  History of Anesthesia Complications Negative for: history of anesthetic complications  Airway Mallampati: II  TM Distance: >3 FB     Dental no notable dental hx.    Pulmonary former smoker,    Pulmonary exam normal        Cardiovascular hypertension, + CAD and + Peripheral Vascular Disease   Rhythm:Regular Rate:Normal     Neuro/Psych negative neurological ROS  negative psych ROS   GI/Hepatic Neg liver ROS, GERD  ,  Endo/Other  diabetes, Poorly Controlled, Type 2, Insulin Dependent  Renal/GU negative Renal ROS  negative genitourinary   Musculoskeletal infected left long finger   Abdominal Normal abdominal exam  (+)   Peds  Hematology negative hematology ROS (+)   Anesthesia Other Findings Day of surgery medications reviewed with patient.  Reproductive/Obstetrics negative OB ROS                            Anesthesia Physical Anesthesia Plan  ASA: 3 and emergent  Anesthesia Plan: MAC and Regional   Post-op Pain Management: Tylenol PO (pre-op) and Regional block   Induction: Intravenous  PONV Risk Score and Plan: Treatment may vary due to age or medical condition, Ondansetron, Propofol infusion, TIVA and Dexamethasone  Airway Management Planned: Natural Airway, Simple Face Mask and Nasal Cannula  Additional Equipment: None  Intra-op Plan:   Post-operative Plan:   Informed Consent: I have reviewed the patients History and Physical, chart, labs and discussed the procedure including the risks, benefits and alternatives for the proposed anesthesia with the patient or authorized representative who has indicated his/her understanding and acceptance.     Dental advisory given  Plan Discussed with:  CRNA  Anesthesia Plan Comments:        Anesthesia Quick Evaluation

## 2021-09-14 NOTE — Progress Notes (Signed)
Assisted Dr. Greg Stoltzfus with left, ultrasound guided, interscalene  block. Side rails up, monitors on throughout procedure. See vital signs in flow sheet. Tolerated Procedure well. 

## 2021-09-14 NOTE — H&P (View-Only) (Signed)
4 Days Post-Op  Subjective: Resting in bed, no distress. Reports some pain to hand, denies subjective fevers/chills.    Objective: Vital signs in last 24 hours: Temp:  [97.8 F (36.6 C)-98.1 F (36.7 C)] 98.1 F (36.7 C) (12/09 0456) Pulse Rate:  [75-99] 99 (12/09 0456) Resp:  [17-20] 20 (12/09 0456) BP: (134-143)/(72-88) 143/88 (12/09 0456) SpO2:  [98 %-100 %] 98 % (12/09 0456) Last BM Date: 09/11/21  Intake/Output from previous day: 12/08 0701 - 12/09 0700 In: 2594.9 [P.O.:2220; I.V.:224.9; IV Piggyback:150] Out: 2250 [Urine:2250] Intake/Output this shift: No intake/output data recorded.  General appearance: alert, cooperative, and no distress Resp: unlabored Extremities: left long finger s/p amputation at PIP joint. Swelling has worsened. There is purulent drainage from the base of the left long finger. Erythema spreading into hand. Increased tenderness over left longer finger amputation stump, extending into hand. No fluid collection or specific area of fluctuance palpated. Radial pulse is palpable.          Lab Results:  CBC Latest Ref Rng & Units 09/13/2021 09/12/2021 09/11/2021  WBC 4.0 - 10.5 K/uL 10.5 13.3(H) 16.3(H)  Hemoglobin 13.0 - 17.0 g/dL 13.4 12.4(L) 12.8(L)  Hematocrit 39.0 - 52.0 % 39.1 38.6(L) 38.3(L)  Platelets 150 - 400 K/uL 260 277 283    BMET Recent Labs    09/12/21 0829 09/13/21 0342  NA 134* 133*  K 4.1 4.3  CL 101 100  CO2 25 25  GLUCOSE 202* 271*  BUN 13 14  CREATININE 0.68 0.69  CALCIUM 8.4* 8.4*   PT/INR No results for input(s): LABPROT, INR in the last 72 hours. ABG No results for input(s): PHART, HCO3 in the last 72 hours.  Invalid input(s): PCO2, PO2  Studies/Results: No results found.  Anti-infectives: Anti-infectives (From admission, onward)    Start     Dose/Rate Route Frequency Ordered Stop   09/10/21 1300  ceFAZolin (ANCEF) IVPB 2g/100 mL premix  Status:  Discontinued        2 g 200 mL/hr over 30 Minutes  Intravenous On call to O.R. 09/10/21 1236 09/10/21 1843   09/10/21 0600  ceFAZolin (ANCEF) IVPB 1 g/50 mL premix        1 g 100 mL/hr over 30 Minutes Intravenous Every 8 hours 09/09/21 1342     09/07/21 0900  cefTRIAXone (ROCEPHIN) 2 g in sodium chloride 0.9 % 100 mL IVPB  Status:  Discontinued        2 g 200 mL/hr over 30 Minutes Intravenous Every 24 hours 09/06/21 1716 09/09/21 1342   09/07/21 0630  cefTRIAXone (ROCEPHIN) 2 g in sodium chloride 0.9 % 100 mL IVPB  Status:  Discontinued        2 g 200 mL/hr over 30 Minutes Intravenous  Once 09/06/21 1709 09/06/21 1716   09/06/21 2000  vancomycin (VANCOREADY) IVPB 1750 mg/350 mL  Status:  Discontinued        1,750 mg 175 mL/hr over 120 Minutes Intravenous Every 12 hours 09/06/21 0635 09/08/21 0936   09/06/21 0630  cefTRIAXone (ROCEPHIN) 2 g in sodium chloride 0.9 % 100 mL IVPB        2 g 200 mL/hr over 30 Minutes Intravenous  Once 09/06/21 0617 09/06/21 0921   09/06/21 0630  vancomycin (VANCOREADY) IVPB 2000 mg/400 mL        2,000 mg 200 mL/hr over 120 Minutes Intravenous  Once 09/06/21 0622 09/06/21 1120       Assessment/Plan: s/p Procedure(s): AMPUTATION DIGIT IRRIGATION AND DEBRIDEMENT OF LEFT  LONG FINGER WOUND  Plan for further amputation of left long finger and I&D with Dr. Arita Miss this afternoon. Patient made NPO. Discussed risks with patient, all of his questions answered to his content. Dressing changed today by me.   Continue IV antibiotics, will discuss taking additional cultures today for susceptibilities.    LOS: 8 days    Leslee Home, PA-C 09/14/2021

## 2021-09-14 NOTE — Op Note (Signed)
Operative Note   DATE OF OPERATION: 09/14/2021  SURGICAL DEPARTMENT: Plastic Surgery  PREOPERATIVE DIAGNOSES: Left long finger infection with gangrene  POSTOPERATIVE DIAGNOSES:  same  PROCEDURE: Incision and drainage left long finger with ray amputation  SURGEON: Ancil Linsey, MD  ASSISTANT: None  ANESTHESIA:  General.   COMPLICATIONS: None.   INDICATIONS FOR PROCEDURE:  The patient, Austin [name] is a 65 y.o. male born on 04-Jun-1956, is here for treatment of progressing left long finger infection MRN: [id number]  CONSENT:  Informed consent was obtained directly from the patient. Risks, benefits and alternatives were fully discussed. Specific risks including but not limited to bleeding, infection, hematoma, seroma, scarring, pain, contracture, asymmetry, wound healing problems, and need for further surgery were all discussed. The patient did have an ample opportunity to have questions answered to satisfaction.   DESCRIPTION OF PROCEDURE:  The patient was taken to the operating room. SCDs were placed and antibiotics were given.  Regional anesthesia was administered.  [name] operative site was prepped and draped in a sterile fashion. A time out was performed and all information was confirmed to be correct.  Started by evaluating the finger.  This showed evidence of purulence from the loose closure from his more distal amputation done several days ago.  There look to be devascularization of the surrounding soft tissues left in the finger itself.  Arm was exsanguinated with gravity and tourniquet inflated to 250 mmHg.  A fishmouth type incision was made around the level of the MP joint.  Direct purulence was encountered.  This was cultured.  I then extended the dorsal incision proximal several centimeters proximal to the metacarpal head.  Sagittal saw was then used to come across the metacarpal.  The entirety of the finger and distal metacarpal were then amputated sharply.  I  explored for any additional purulence and none was encountered.  The tendons were trimmed back sharply.  The wound was then irrigated copiously with Irrisept and saline.  3-0 PDS mattress sutures were then used to close the same incision extension dorsally to give better bone coverage.  Tourniquet was then let down.  Tourniquet time was 14 minutes.  Hemostasis was obtained with cautery.  Wound was packed with ribbon gauze.  Soft wrap was applied.  The patient tolerated the procedure well.  There were no complications. The patient was allowed to wake from anesthesia, extubated and taken to the recovery room in satisfactory condition.

## 2021-09-14 NOTE — Anesthesia Postprocedure Evaluation (Signed)
Anesthesia Post Note  Patient: Austin [name]  Procedure(s) Performed: AMPUTATION DIGIT left long finger and irrigation and debridement (Left)     Patient location during evaluation: PACU Anesthesia Type: Regional and MAC Level of consciousness: awake and alert Pain management: pain level controlled Vital Signs Assessment: post-procedure vital signs reviewed and stable Respiratory status: spontaneous breathing, nonlabored ventilation, respiratory function stable and patient connected to nasal cannula oxygen Cardiovascular status: stable and blood pressure returned to baseline Postop Assessment: no apparent nausea or vomiting Anesthetic complications: no   No notable events documented.  Last Vitals:  Vitals:   09/14/21 1930 09/14/21 2005  BP: 115/76 127/69  Pulse: 82 74  Resp: (!) 21 17  Temp:  36.7 C  SpO2: 96% 99%    Last Pain:  Vitals:   09/14/21 2005  TempSrc: Oral  PainSc:                  March Rummage Reynolds Kittel

## 2021-09-14 NOTE — Anesthesia Procedure Notes (Signed)
Anesthesia Regional Block: Supraclavicular block   Pre-Anesthetic Checklist: , timeout performed,  Correct Patient, Correct Site, Correct Laterality,  Correct Procedure, Correct Position, site marked,  Risks and benefits discussed,  Surgical consent,  Pre-op evaluation,  At surgeon's request and post-op pain management  Laterality: Left  Prep: Dura Prep       Needles:  Injection technique: Single-shot  Needle Type: Echogenic Stimulator Needle     Needle Length: 5cm  Needle Gauge: 20     Additional Needles:   Procedures:,,,, ultrasound used (permanent image in chart),,    Narrative:  Start time: 09/14/2021 5:48 PM End time: 09/14/2021 5:50 PM Injection made incrementally with aspirations every 5 mL.  Performed by: Personally  Anesthesiologist: Atilano Median, DO  Additional Notes: Patient identified. Risks/Benefits/Options discussed with patient including but not limited to bleeding, infection, nerve damage, failed block, incomplete pain control. Patient expressed understanding and wished to proceed. All questions were answered. Sterile technique was used throughout the entire procedure. Please see nursing notes for vital signs. Aspirated in 5cc intervals with injection for negative confirmation. Patient was given instructions on fall risk and not to get out of bed. All questions and concerns addressed with instructions to call with any issues or inadequate analgesia.

## 2021-09-15 ENCOUNTER — Encounter (HOSPITAL_COMMUNITY): Payer: Self-pay | Admitting: Plastic Surgery

## 2021-09-15 LAB — GLUCOSE, CAPILLARY
Glucose-Capillary: 241 mg/dL — ABNORMAL HIGH (ref 70–99)
Glucose-Capillary: 315 mg/dL — ABNORMAL HIGH (ref 70–99)
Glucose-Capillary: 200 mg/dL — ABNORMAL HIGH (ref 70–99)
Glucose-Capillary: 202 mg/dL — ABNORMAL HIGH (ref 70–99)

## 2021-09-15 LAB — CBC
HCT: 37.5 % — ABNORMAL LOW (ref 39.0–52.0)
Hemoglobin: 12.2 g/dL — ABNORMAL LOW (ref 13.0–17.0)
MCH: 28 pg (ref 26.0–34.0)
MCHC: 32.5 g/dL (ref 30.0–36.0)
MCV: 86 fL (ref 80.0–100.0)
Platelets: 292 10*3/uL (ref 150–400)
RBC: 4.36 MIL/uL (ref 4.22–5.81)
RDW: 13.9 % (ref 11.5–15.5)
WBC: 14.3 10*3/uL — ABNORMAL HIGH (ref 4.0–10.5)
nRBC: 0 % (ref 0.0–0.2)

## 2021-09-15 LAB — BASIC METABOLIC PANEL
Anion gap: 9 (ref 5–15)
BUN: 12 mg/dL (ref 8–23)
CO2: 24 mmol/L (ref 22–32)
Calcium: 8.5 mg/dL — ABNORMAL LOW (ref 8.9–10.3)
Chloride: 99 mmol/L (ref 98–111)
Creatinine, Ser: 0.7 mg/dL (ref 0.61–1.24)
GFR, Estimated: >60 mL/min (ref >60)
Glucose, Bld: 249 mg/dL — ABNORMAL HIGH (ref 70–99)
Potassium: 4.7 mmol/L (ref 3.5–5.1)
Sodium: 132 mmol/L — ABNORMAL LOW (ref 135–145)

## 2021-09-15 MED ORDER — POVIDONE-IODINE 5 % EX SOLN
Freq: Two times a day (BID) | CUTANEOUS | Status: DC
  Filled 2021-09-15: qty 88.7

## 2021-09-15 NOTE — Plan of Care (Signed)

## 2021-09-15 NOTE — Progress Notes (Signed)
PROGRESS NOTE    Austin [name]  [id number] DOB: 1956/09/17 DOA: 09/05/2021  PCP: Theotis Burrow, MD   Brief Narrative:  This 65 years old male with PMH significant for CAD, diabetes type 2, history of DVT, GERD, hyperlipidemia, hypertension, peripheral vascular disease presented in the ED with progressive swelling,  redness of the left middle finger with left hand edema for 5 to 6 days.  Patient has initially developed an ulcer on the tip of left middle finger without an obvious trauma and symptoms continued to get worse.  X-ray of the left hand did not show any fracture or dislocation but soft tissue swelling.  Patient was started on vancomycin and ceftriaxone and admitted for further evaluation.  Plastic surgery was consulted,  Patient underwent amputation of the left middle finger.  Assessment & Plan:   Principal Problem:   Infection of left hand Active Problems:   Essential hypertension   Hyperlipidemia   Type 2 diabetes mellitus with diabetic peripheral angiopathy without gangrene (HCC)   Coronary artery disease   Lactic acidosis   Peripheral vascular disease (HCC)   Pressure injury of coccygeal region, stage 1  Left middle finger infection with cellulitis of left dorsal hand: Patient was seen by plastic surgery and underwent incision and drainage at bedside in the ED.   Status post Left long finger amputation at the PIP joint and incision and drainage of the flexor tendon sheath on 09/10/2021.  Patient remains afebrile, Leucocytosis trending down.  Continue IV cefazolin, adequate pain control with oxycodone. Follow orthopedic recommendations.  Cultures so far no growth. Continue local wound care with daily xeroform, 4 x 4 guaze, ace wrap. After dressing change it was noted the swelling has worsened, there was a purulent drainage noted. Plastic surgery has recommended further amputation of left long finger and incision and drainage with Dr. Claudia Desanctis 12/09 Patient  underwent ray amputation of the left long finger yesterday for progressing infection with gangrene.   Essential hypertension: Continue losartan. Continue as needed hydralazine.  Hyperlipidemia: Continue Lipitor.  Coronary artery disease: Continue aspirin, losartan and statin.  Type 2 diabetes with hyperglycemia: Hemoglobin A1c 12.2.  Poorly controlled Continue Semglee 35 units daily Continue regular insulin sliding scale. Dietitian consult   Pressure injury of coccygeal region, stage 1 POA Continue wound care protocol. Continue prevention, frequent posture change every 4 hours    DVT prophylaxis: Eliquis Code Status: Full code Family Communication: No family at bedside Disposition Plan:   Status is: Inpatient  Remains inpatient appropriate because: Infection of the left middle finger with necrosis requiring debridement and IV antibiotics.  Patient is requiring further amputation of the left long finger.  Consultants:  Orthopedics  Procedures:  Left long finger amputation at the PIP joint. Incision and drainage of the flexor tendon sheath left long finger   Antimicrobials:  Anti-infectives (From admission, onward)    Start     Dose/Rate Route Frequency Ordered Stop   09/10/21 1300  ceFAZolin (ANCEF) IVPB 2g/100 mL premix  Status:  Discontinued        2 g 200 mL/hr over 30 Minutes Intravenous On call to O.R. 09/10/21 1236 09/10/21 1843   09/10/21 0600  ceFAZolin (ANCEF) IVPB 1 g/50 mL premix        1 g 100 mL/hr over 30 Minutes Intravenous Every 8 hours 09/09/21 1342     09/07/21 0900  cefTRIAXone (ROCEPHIN) 2 g in sodium chloride 0.9 % 100 mL IVPB  Status:  Discontinued  2 g 200 mL/hr over 30 Minutes Intravenous Every 24 hours 09/06/21 1716 09/09/21 1342   09/07/21 0630  cefTRIAXone (ROCEPHIN) 2 g in sodium chloride 0.9 % 100 mL IVPB  Status:  Discontinued        2 g 200 mL/hr over 30 Minutes Intravenous  Once 09/06/21 1709 09/06/21 1716   09/06/21 2000   vancomycin (VANCOREADY) IVPB 1750 mg/350 mL  Status:  Discontinued        1,750 mg 175 mL/hr over 120 Minutes Intravenous Every 12 hours 09/06/21 0635 09/08/21 0936   09/06/21 0630  cefTRIAXone (ROCEPHIN) 2 g in sodium chloride 0.9 % 100 mL IVPB        2 g 200 mL/hr over 30 Minutes Intravenous  Once 09/06/21 0617 09/06/21 0921   09/06/21 0630  vancomycin (VANCOREADY) IVPB 2000 mg/400 mL        2,000 mg 200 mL/hr over 120 Minutes Intravenous  Once 09/06/21 0622 09/06/21 1120       Subjective: Patient was seen and examined at bedside.  Overnight events noted.   Patient underwent ray amputation of left long finger yesterday tolerated well.  He reports pain is manageable. His left hand is covered in dressing.   Objective: Vitals:   09/15/21 0009 09/15/21 0409 09/15/21 0837 09/15/21 1331  BP: 114/82 127/89 109/69 135/60  Pulse: 89 90 67 (!) 103  Resp: 16 16 17 20   Temp: (!) 97.5 F (36.4 C) 98.6 F (37 C) 97.7 F (36.5 C) 97.9 F (36.6 C)  TempSrc: Oral Oral Oral Oral  SpO2: 99% 98% 96% 99%  Weight:      Height:        Intake/Output Summary (Last 24 hours) at 09/15/2021 1421 Last data filed at 09/15/2021 1208 Gross per 24 hour  Intake 1520 ml  Output 1406 ml  Net 114 ml   Filed Weights   09/06/21 0607 09/06/21 0744 09/14/21 2010  Weight: 111.1 kg 110.7 kg 103.3 kg    Examination:  General exam: Appears comfortable but remains in a lot of pain.  Deconditioned Respiratory system: Clear to auscultation bilaterally. Respiratory effort normal.  RR 15 Cardiovascular system: S1-S2 heard, regular rate and rhythm, no murmur. Gastrointestinal system: Abdomen is soft, nontender, nondistended, BS+ Central nervous system: Alert and oriented x 3. No focal neurological deficits. Extremities: Left hand middle finger amputated,  remained in dressing. Skin: No rashes, lesions or ulcers Psychiatry: Judgement and insight appear normal. Mood & affect appropriate.     Data Reviewed:  I have personally reviewed following labs and imaging studies  CBC: Recent Labs  Lab 09/10/21 0526 09/11/21 0340 09/12/21 0829 09/13/21 0342 09/15/21 0626  WBC 16.7* 16.3* 13.3* 10.5 14.3*  HGB 14.7 12.8* 12.4* 13.4 12.2*  HCT 42.8 38.3* 38.6* 39.1 37.5*  MCV 82.8 83.4 85.6 82.7 86.0  PLT 268 283 277 260 [id number]   Basic Metabolic Panel: Recent Labs  Lab 09/09/21 0330 09/10/21 0526 09/11/21 0340 09/12/21 0829 09/13/21 0342 09/15/21 0626  NA 131* 130* 130* 134* 133* 132*  K 4.1 4.7 4.2 4.1 4.3 4.7  CL 101 99 98 101 100 99  CO2 23 23 24 25 25 24   GLUCOSE 263* 276* 282* 202* 271* 249*  BUN 10 9 13 13 14 12   CREATININE 0.75 0.86 0.76 0.68 0.69 0.70  CALCIUM 8.5* 8.8* 8.6* 8.4* 8.4* 8.5*  MG 1.8  --  2.0  --  1.9  --   PHOS  --   --   --   --  2.8  --    GFR: Estimated Creatinine Clearance: 111.5 mL/min (by C-G formula based on SCr of 0.7 mg/dL). Liver Function Tests: No results for input(s): AST, ALT, ALKPHOS, BILITOT, PROT, ALBUMIN in the last 168 hours.  No results for input(s): LIPASE, AMYLASE in the last 168 hours. No results for input(s): AMMONIA in the last 168 hours. Coagulation Profile: No results for input(s): INR, PROTIME in the last 168 hours. Cardiac Enzymes: No results for input(s): CKTOTAL, CKMB, CKMBINDEX, TROPONINI in the last 168 hours. BNP (last 3 results) No results for input(s): PROBNP in the last 8760 hours. HbA1C: No results for input(s): HGBA1C in the last 72 hours. CBG: Recent Labs  Lab 09/14/21 1701 09/14/21 1926 09/14/21 2008 09/15/21 0801 09/15/21 1143  GLUCAP 170* 156* 155* 241* 315*   Lipid Profile: No results for input(s): CHOL, HDL, LDLCALC, TRIG, CHOLHDL, LDLDIRECT in the last 72 hours. Thyroid Function Tests: No results for input(s): TSH, T4TOTAL, FREET4, T3FREE, THYROIDAB in the last 72 hours. Anemia Panel: No results for input(s): VITAMINB12, FOLATE, FERRITIN, TIBC, IRON, RETICCTPCT in the last 72 hours. Sepsis Labs: No  results for input(s): PROCALCITON, LATICACIDVEN in the last 168 hours.   Recent Results (from the past 240 hour(s))  Resp Panel by RT-PCR (Flu A&B, Covid) Nasopharyngeal Swab     Status: None   Collection Time: 09/06/21  6:16 AM   Specimen: Nasopharyngeal Swab; Nasopharyngeal(NP) swabs in vial transport medium  Result Value Ref Range Status   SARS Coronavirus 2 by RT PCR NEGATIVE NEGATIVE Final    Comment: (NOTE) SARS-CoV-2 target nucleic acids are NOT DETECTED.  The SARS-CoV-2 RNA is generally detectable in upper respiratory specimens during the acute phase of infection. The lowest concentration of SARS-CoV-2 viral copies this assay can detect is 138 copies/mL. A negative result does not preclude SARS-Cov-2 infection and should not be used as the sole basis for treatment or other patient management decisions. A negative result may occur with  improper specimen collection/handling, submission of specimen other than nasopharyngeal swab, presence of viral mutation(s) within the areas targeted by this assay, and inadequate number of viral copies(<138 copies/mL). A negative result must be combined with clinical observations, patient history, and epidemiological information. The expected result is Negative.  Fact Sheet for Patients:  BloggerCourse.com  Fact Sheet for Healthcare Providers:  SeriousBroker.it  This test is no t yet approved or cleared by the [place] FDA and  has been authorized for detection and/or diagnosis of SARS-CoV-2 by FDA under an Emergency Use Authorization (EUA). This EUA will remain  in effect (meaning this test can be used) for the duration of the COVID-19 declaration under Section 564(b)(1) of the Act, 21 U.S.C.section 360bbb-3(b)(1), unless the authorization is terminated  or revoked sooner.       Influenza A by PCR NEGATIVE NEGATIVE Final   Influenza B by PCR NEGATIVE NEGATIVE Final    Comment:  (NOTE) The Xpert Xpress SARS-CoV-2/FLU/RSV plus assay is intended as an aid in the diagnosis of influenza from Nasopharyngeal swab specimens and should not be used as a sole basis for treatment. Nasal washings and aspirates are unacceptable for Xpert Xpress SARS-CoV-2/FLU/RSV testing.  Fact Sheet for Patients: BloggerCourse.com  Fact Sheet for Healthcare Providers: SeriousBroker.it  This test is not yet approved or cleared by the [place] FDA and has been authorized for detection and/or diagnosis of SARS-CoV-2 by FDA under an Emergency Use Authorization (EUA). This EUA will remain in effect (meaning this test can be used) for  the duration of the COVID-19 declaration under Section 564(b)(1) of the Act, 21 U.S.C. section 360bbb-3(b)(1), unless the authorization is terminated or revoked.  Performed at [facility], [place] [address] [place], [place] [address]   Aerobic Culture w Gram Stain (superficial specimen)     Status: None   Collection Time: 09/06/21  8:56 AM   Specimen: Wound  Result Value Ref Range Status   Specimen Description   Final    WOUND LT FINGER Performed at [facility] [address] [place], [place] [address]    Special Requests   Final    Normal Performed at [facility], [place] [address] [place], [place] [address]    Gram Stain   Final    MODERATE WBC PRESENT,BOTH PMN AND MONONUCLEAR ABUNDANT GRAM POSITIVE COCCI ABUNDANT GRAM NEGATIVE RODS FEW GRAM VARIABLE ROD    Culture   Final    ABUNDANT STREPTOCOCCUS AGALACTIAE TESTING AGAINST S. AGALACTIAE NOT ROUTINELY PERFORMED DUE TO PREDICTABILITY OF AMP/PEN/VAN SUSCEPTIBILITY. RARE STAPHYLOCOCCUS AUREUS WITHIN MIXED ORGANISMS Performed at [facility], [place] [address], [place], [place] [address]    Report Status 09/09/2021 FINAL  Final   Organism ID, Bacteria STAPHYLOCOCCUS AUREUS  Final       Susceptibility   Staphylococcus aureus - MIC*    CIPROFLOXACIN <=0.5 SENSITIVE Sensitive     ERYTHROMYCIN <=0.25 SENSITIVE Sensitive     GENTAMICIN <=0.5 SENSITIVE Sensitive     OXACILLIN <=0.25 SENSITIVE Sensitive     TETRACYCLINE <=1 SENSITIVE Sensitive     VANCOMYCIN <=0.5 SENSITIVE Sensitive     TRIMETH/SULFA <=10 SENSITIVE Sensitive     CLINDAMYCIN <=0.25 SENSITIVE Sensitive     RIFAMPIN <=0.5 SENSITIVE Sensitive     Inducible Clindamycin NEGATIVE Sensitive     * RARE STAPHYLOCOCCUS AUREUS  Aerobic/Anaerobic Culture w Gram Stain (surgical/deep wound)     Status: None   Collection Time: 09/10/21  5:27 PM   Specimen: PATH Soft tissue  Result Value Ref Range Status   Specimen Description   Final    TISSUE LEFT LONG FINGER Performed at [facility] [address], [place], [place] [address]    Special Requests   Final    NONE Performed at [facility], [place] [address] [place], [place] [address]    Gram Stain   Final    NO SQUAMOUS EPITHELIAL CELLS SEEN NO WBC SEEN NO ORGANISMS SEEN    Culture   Final    FEW PREVOTELLA BIVIA BETA LACTAMASE POSITIVE Performed at [facility], [place] [address] [place], [place] [address]    Report Status 09/14/2021 FINAL  Final  Surgical pcr screen     Status: None   Collection Time: 09/14/21  2:03 PM   Specimen: Nasal Mucosa; Nasal Swab  Result Value Ref Range Status   MRSA, PCR NEGATIVE NEGATIVE Final   Staphylococcus aureus NEGATIVE NEGATIVE Final    Comment: (NOTE) The Xpert SA Assay (FDA approved for NASAL specimens in patients 21 years of age and older), is one component of a comprehensive surveillance program. It is not intended to diagnose infection nor to guide or monitor treatment. Performed at [facility], [place] [address] [place], [place] [address]   Aerobic/Anaerobic Culture w Gram Stain (surgical/deep wound)     Status: None (Preliminary result)   Collection  Time: 09/14/21  6:35 PM   Specimen: Wound  Result Value Ref Range Status   Specimen Description  Final    WOUND LEFT FINGER MIDDLE Performed at [facility], [place] [address] [place], [place] [address]    Special Requests   Final    NONE Performed at [facility], [place] [address], [place], [place] [address]    Gram Stain   Final    ABUNDANT WBC PRESENT,BOTH PMN AND MONONUCLEAR ABUNDANT GRAM POSITIVE COCCI FEW GRAM POSITIVE RODS RARE GRAM NEGATIVE RODS    Culture   Final    NO GROWTH < 24 HOURS Performed at [facility], [place] [address], [place], [place] [address]    Report Status PENDING  Incomplete    Radiology Studies: No results found.  Scheduled Meds:  apixaban  5 mg Oral BID   aspirin EC  81 mg Oral Daily   atorvastatin  80 mg Oral q1800   docusate sodium  100 mg Oral BID   insulin aspart  0-20 Units Subcutaneous TID WC   insulin aspart  0-5 Units Subcutaneous QHS   insulin aspart  5 Units Subcutaneous TID WC   insulin glargine-yfgn  35 Units Subcutaneous QHS   losartan  25 mg Oral Daily   nystatin   Topical TID   polyethylene glycol  17 g Oral Daily   povidone-Iodine   Topical BID   Continuous Infusions:  sodium chloride 10 mL/hr at 09/13/21 1225    ceFAZolin (ANCEF) IV 1 g (09/15/21 0548)     LOS: 9 days    Time spent: 25 mins    Zelina Jimerson, MD [facility]   If 7PM-7AM, please contact night-coverage

## 2021-09-16 LAB — BASIC METABOLIC PANEL
Anion gap: 7 (ref 5–15)
BUN: 12 mg/dL (ref 8–23)
CO2: 27 mmol/L (ref 22–32)
Calcium: 8.6 mg/dL — ABNORMAL LOW (ref 8.9–10.3)
Chloride: 100 mmol/L (ref 98–111)
Creatinine, Ser: 0.74 mg/dL (ref 0.61–1.24)
GFR, Estimated: >60 mL/min (ref >60)
Glucose, Bld: 250 mg/dL — ABNORMAL HIGH (ref 70–99)
Potassium: 4.3 mmol/L (ref 3.5–5.1)
Sodium: 134 mmol/L — ABNORMAL LOW (ref 135–145)

## 2021-09-16 LAB — GLUCOSE, CAPILLARY
Glucose-Capillary: 304 mg/dL — ABNORMAL HIGH (ref 70–99)
Glucose-Capillary: 334 mg/dL — ABNORMAL HIGH (ref 70–99)
Glucose-Capillary: 272 mg/dL — ABNORMAL HIGH (ref 70–99)
Glucose-Capillary: 315 mg/dL — ABNORMAL HIGH (ref 70–99)

## 2021-09-16 MED ORDER — INSULIN GLARGINE-YFGN 100 UNIT/ML ~~LOC~~ SOLN
40.0000 [IU] | Freq: Every day | SUBCUTANEOUS | Status: DC
  Administered 2021-09-16: 40 [IU] via SUBCUTANEOUS
  Filled 2021-09-16 (×2): qty 0.4

## 2021-09-16 NOTE — Progress Notes (Addendum)
PROGRESS NOTE    Austin [name]  LYY:[id number] DOB: 05-Feb-1956 DOA: 09/05/2021  PCP: Preston Fleeting, MD   Brief Narrative:  This 65 years old male with PMH significant for CAD, diabetes type 2, history of DVT, GERD, hyperlipidemia, hypertension, peripheral vascular disease presented in the ED with progressive swelling,  redness of the left middle finger with left hand edema for 5 to 6 days.  Patient has initially developed an ulcer on the tip of left middle finger without an obvious trauma and symptoms continued to get worse.  X-ray of the left hand did not show any fracture or dislocation but soft tissue swelling.  Patient was started on vancomycin and ceftriaxone and admitted for further evaluation.  Plastic surgery was consulted,  Patient underwent amputation of the left middle finger.  Assessment & Plan:   Principal Problem:   Infection of left hand Active Problems:   Essential hypertension   Hyperlipidemia   Type 2 diabetes mellitus with diabetic peripheral angiopathy without gangrene (HCC)   Coronary artery disease   Lactic acidosis   Peripheral vascular disease (HCC)   Pressure injury of coccygeal region, stage 1  Left middle finger infection with cellulitis of left dorsal hand: Patient was seen by plastic surgery and underwent incision and drainage at bedside in the ED.   Status post Left long finger amputation at the PIP joint and incision and drainage of the flexor tendon sheath on 09/10/2021.  Patient remains afebrile, Leucocytosis trending down.  Continue IV cefazolin, adequate pain control with oxycodone. Follow orthopedic recommendations.  Cultures so far no growth. Continue local wound care with daily xeroform, 4 x 4 guaze, ace wrap. After dressing change it was noted the swelling has worsened, there was a purulent drainage noted. Plastic surgery has recommended further amputation of left long finger and incision and drainage with Dr. Arita Miss 12/09 Patient  underwent ray amputation of the left long finger 12/10 for progressing infection with gangrene. Follow-up intraoperative cultures.   Essential hypertension: Continue losartan. Continue as needed hydralazine.  Hyperlipidemia: Continue Lipitor.  Coronary artery disease: Continue aspirin, losartan and statin.  Type 2 diabetes with hyperglycemia: Hemoglobin A1c 12.2.  Poorly controlled Increase Semglee 40 units daily Continue regular insulin sliding scale. Dietitian consult   Pressure injury of coccygeal region, stage 1 POA Continue wound care protocol. Continue prevention, frequent posture change every 4 hours    DVT prophylaxis: Eliquis Code Status: Full code Family Communication: No family at bedside Disposition Plan:   Status is: Inpatient  Remains inpatient appropriate because: Infection of the left middle finger with necrosis requiring debridement and IV antibiotics.  Patient is requiring further amputation of the left long finger.  Consultants:  Orthopedics  Procedures:  Left long finger amputation at the PIP joint. Incision and drainage of the flexor tendon sheath left long finger   Antimicrobials:  Anti-infectives (From admission, onward)    Start     Dose/Rate Route Frequency Ordered Stop   09/10/21 1300  ceFAZolin (ANCEF) IVPB 2g/100 mL premix  Status:  Discontinued        2 g 200 mL/hr over 30 Minutes Intravenous On call to O.R. 09/10/21 1236 09/10/21 1843   09/10/21 0600  ceFAZolin (ANCEF) IVPB 1 g/50 mL premix        1 g 100 mL/hr over 30 Minutes Intravenous Every 8 hours 09/09/21 1342     09/07/21 0900  cefTRIAXone (ROCEPHIN) 2 g in sodium chloride 0.9 % 100 mL IVPB  Status:  Discontinued  2 g 200 mL/hr over 30 Minutes Intravenous Every 24 hours 09/06/21 1716 09/09/21 1342   09/07/21 0630  cefTRIAXone (ROCEPHIN) 2 g in sodium chloride 0.9 % 100 mL IVPB  Status:  Discontinued        2 g 200 mL/hr over 30 Minutes Intravenous  Once 09/06/21 1709  09/06/21 1716   09/06/21 2000  vancomycin (VANCOREADY) IVPB 1750 mg/350 mL  Status:  Discontinued        1,750 mg 175 mL/hr over 120 Minutes Intravenous Every 12 hours 09/06/21 0635 09/08/21 0936   09/06/21 0630  cefTRIAXone (ROCEPHIN) 2 g in sodium chloride 0.9 % 100 mL IVPB        2 g 200 mL/hr over 30 Minutes Intravenous  Once 09/06/21 0617 09/06/21 0921   09/06/21 0630  vancomycin (VANCOREADY) IVPB 2000 mg/400 mL        2,000 mg 200 mL/hr over 120 Minutes Intravenous  Once 09/06/21 0622 09/06/21 1120       Subjective: Patient was seen and examined at bedside.  Overnight events noted.   Patient underwent ray amputation of left long finger 12/10 tolerated well.  He reports pain is manageable. His left hand is covered in dressing.   Objective: Vitals:   09/15/21 1331 09/15/21 2201 09/16/21 0444 09/16/21 1417  BP: 135/60 (!) 129/96 137/69 106/64  Pulse: (!) 103 69 77 68  Resp: 20 19 19 16   Temp: 97.9 F (36.6 C) 99.7 F (37.6 C) 98.1 F (36.7 C) 97.9 F (36.6 C)  TempSrc: Oral Oral Oral Oral  SpO2: 99% 97% 96% 91%  Weight:      Height:        Intake/Output Summary (Last 24 hours) at 09/16/2021 1512 Last data filed at 09/16/2021 1100 Gross per 24 hour  Intake 720 ml  Output 2800 ml  Net -2080 ml   Filed Weights   09/06/21 0607 09/06/21 0744 09/14/21 2010  Weight: 111.1 kg 110.7 kg 103.3 kg    Examination:  General exam: Appears comfortable but remains in a lot of pain.  Deconditioned Respiratory system: Clear to auscultation bilaterally. Respiratory effort normal.  RR 15 Cardiovascular system: S1-S2 heard, regular rate and rhythm, no murmur. Gastrointestinal system: Abdomen is soft, nontender, nondistended, BS+ Central nervous system: Alert and oriented x 3. No focal neurological deficits. Extremities: Left hand middle finger amputated,  remained in dressing. Skin: No rashes, lesions or ulcers Psychiatry: Judgement and insight appear normal. Mood & affect  appropriate.     Data Reviewed: I have personally reviewed following labs and imaging studies  CBC: Recent Labs  Lab 09/10/21 0526 09/11/21 0340 09/12/21 0829 09/13/21 0342 09/15/21 0626  WBC 16.7* 16.3* 13.3* 10.5 14.3*  HGB 14.7 12.8* 12.4* 13.4 12.2*  HCT 42.8 38.3* 38.6* 39.1 37.5*  MCV 82.8 83.4 85.6 82.7 86.0  PLT 268 283 277 260 [id number]   Basic Metabolic Panel: Recent Labs  Lab 09/11/21 0340 09/12/21 0829 09/13/21 0342 09/15/21 0626 09/16/21 0626  NA 130* 134* 133* 132* 134*  K 4.2 4.1 4.3 4.7 4.3  CL 98 101 100 99 100  CO2 24 25 25 24 27   GLUCOSE 282* 202* 271* 249* 250*  BUN 13 13 14 12 12   CREATININE 0.76 0.68 0.69 0.70 0.74  CALCIUM 8.6* 8.4* 8.4* 8.5* 8.6*  MG 2.0  --  1.9  --   --   PHOS  --   --  2.8  --   --    GFR: Estimated Creatinine Clearance:  111.5 mL/min (by C-G formula based on SCr of 0.74 mg/dL). Liver Function Tests: No results for input(s): AST, ALT, ALKPHOS, BILITOT, PROT, ALBUMIN in the last 168 hours.  No results for input(s): LIPASE, AMYLASE in the last 168 hours. No results for input(s): AMMONIA in the last 168 hours. Coagulation Profile: No results for input(s): INR, PROTIME in the last 168 hours. Cardiac Enzymes: No results for input(s): CKTOTAL, CKMB, CKMBINDEX, TROPONINI in the last 168 hours. BNP (last 3 results) No results for input(s): PROBNP in the last 8760 hours. HbA1C: No results for input(s): HGBA1C in the last 72 hours. CBG: Recent Labs  Lab 09/15/21 1143 09/15/21 1652 09/15/21 2203 09/16/21 0708 09/16/21 1242  GLUCAP 315* 200* 202* 304* 334*   Lipid Profile: No results for input(s): CHOL, HDL, LDLCALC, TRIG, CHOLHDL, LDLDIRECT in the last 72 hours. Thyroid Function Tests: No results for input(s): TSH, T4TOTAL, FREET4, T3FREE, THYROIDAB in the last 72 hours. Anemia Panel: No results for input(s): VITAMINB12, FOLATE, FERRITIN, TIBC, IRON, RETICCTPCT in the last 72 hours. Sepsis Labs: No results for input(s):  PROCALCITON, LATICACIDVEN in the last 168 hours.   Recent Results (from the past 240 hour(s))  Aerobic/Anaerobic Culture w Gram Stain (surgical/deep wound)     Status: None   Collection Time: 09/10/21  5:27 PM   Specimen: PATH Soft tissue  Result Value Ref Range Status   Specimen Description   Final    TISSUE LEFT LONG FINGER Performed at [facility], [address] [place], [place] [address]    Special Requests   Final    NONE Performed at [facility], [address] [place], [place] [address]    Gram Stain   Final    NO SQUAMOUS EPITHELIAL CELLS SEEN NO WBC SEEN NO ORGANISMS SEEN    Culture   Final    FEW PREVOTELLA BIVIA BETA LACTAMASE POSITIVE Performed at [facility], [address] [place], [place] [address]    Report Status 09/14/2021 FINAL  Final  Surgical pcr screen     Status: None   Collection Time: 09/14/21  2:03 PM   Specimen: Nasal Mucosa; Nasal Swab  Result Value Ref Range Status   MRSA, PCR NEGATIVE NEGATIVE Final   Staphylococcus aureus NEGATIVE NEGATIVE Final    Comment: (NOTE) The Xpert SA Assay (FDA approved for NASAL specimens in patients 61 years of age and older), is one component of a comprehensive surveillance program. It is not intended to diagnose infection nor to guide or monitor treatment. Performed at [facility], [address], [place], [place] [address]   Aerobic/Anaerobic Culture w Gram Stain (surgical/deep wound)     Status: None (Preliminary result)   Collection Time: 09/14/21  6:35 PM   Specimen: Wound  Result Value Ref Range Status   Specimen Description   Final    WOUND LEFT FINGER MIDDLE Performed at [facility], [address], [place], [place] [address]    Special Requests   Final    NONE Performed at [facility], [address], [place], [place] [address]    Gram Stain   Final    ABUNDANT WBC PRESENT,BOTH PMN AND  MONONUCLEAR ABUNDANT GRAM POSITIVE COCCI FEW GRAM POSITIVE RODS RARE GRAM NEGATIVE RODS    Culture   Final    NO GROWTH 2 DAYS HOLDING FOR POSSIBLE ANAEROBE Performed at [facility], [address], [place], [place] [address]  Report Status PENDING  Incomplete    Radiology Studies: No results found.  Scheduled Meds:  apixaban  5 mg Oral BID   aspirin EC  81 mg Oral Daily   atorvastatin  80 mg Oral q1800   docusate sodium  100 mg Oral BID   insulin aspart  0-20 Units Subcutaneous TID WC   insulin aspart  0-5 Units Subcutaneous QHS   insulin aspart  5 Units Subcutaneous TID WC   insulin glargine-yfgn  40 Units Subcutaneous QHS   losartan  25 mg Oral Daily   nystatin   Topical TID   polyethylene glycol  17 g Oral Daily   povidone-Iodine   Topical BID   Continuous Infusions:  sodium chloride 10 mL/hr at 09/13/21 1225    ceFAZolin (ANCEF) IV 1 g (09/16/21 1309)     LOS: 10 days    Time spent: 25 mins    Asahd Can, MD [facility]   If 7PM-7AM, please contact night-coverage

## 2021-09-17 LAB — GLUCOSE, CAPILLARY
Glucose-Capillary: 220 mg/dL — ABNORMAL HIGH (ref 70–99)
Glucose-Capillary: 285 mg/dL — ABNORMAL HIGH (ref 70–99)
Glucose-Capillary: 204 mg/dL — ABNORMAL HIGH (ref 70–99)
Glucose-Capillary: 258 mg/dL — ABNORMAL HIGH (ref 70–99)

## 2021-09-17 LAB — BASIC METABOLIC PANEL
Anion gap: 5 (ref 5–15)
BUN: 12 mg/dL (ref 8–23)
CO2: 27 mmol/L (ref 22–32)
Calcium: 8.5 mg/dL — ABNORMAL LOW (ref 8.9–10.3)
Chloride: 101 mmol/L (ref 98–111)
Creatinine, Ser: 0.69 mg/dL (ref 0.61–1.24)
GFR, Estimated: >60 mL/min (ref >60)
Glucose, Bld: 279 mg/dL — ABNORMAL HIGH (ref 70–99)
Potassium: 4.9 mmol/L (ref 3.5–5.1)
Sodium: 133 mmol/L — ABNORMAL LOW (ref 135–145)

## 2021-09-17 MED ORDER — SODIUM CHLORIDE 0.9 % IV SOLN
3.0000 g | Freq: Four times a day (QID) | INTRAVENOUS | Status: DC
  Administered 2021-09-17 – 2021-09-18 (×5): 3 g via INTRAVENOUS
  Filled 2021-09-17 (×6): qty 8

## 2021-09-17 MED ORDER — INSULIN GLARGINE-YFGN 100 UNIT/ML ~~LOC~~ SOLN
44.0000 [IU] | Freq: Every day | SUBCUTANEOUS | Status: DC
  Administered 2021-09-17 – 2021-09-19 (×3): 44 [IU] via SUBCUTANEOUS
  Filled 2021-09-17 (×3): qty 0.44

## 2021-09-17 NOTE — Progress Notes (Addendum)
Inpatient Diabetes Program Recommendations  AACE/ADA: New Consensus Statement on Inpatient Glycemic Control (2015)  Target Ranges:  Prepandial:   less than 140 mg/dL      Peak postprandial:   less than 180 mg/dL (1-2 hours)      Critically ill patients:  140 - 180 mg/dL   Lab Results  Component Value Date   GLUCAP 220 (H) 09/17/2021   HGBA1C 12.8 (H) 09/07/2021    Review of Glycemic Control  Latest Reference Range & Units 09/16/21 07:08 09/16/21 12:42 09/16/21 17:08 09/16/21 21:05 09/17/21 07:15  Glucose-Capillary 70 - 99 mg/dL 315 (H) 945 (H) 859 (H) 315 (H) 220 (H)  (H): Data is abnormally high  Diabetes history: DM2 Outpatient Diabetes medications: Lantus 30 unitd QHS, Humalog 20 units TID with meals  Current orders for Inpatient glycemic control: Semglee 40 units QHS, Novolog 0-20 units TID and 0-5 units QHS  Inpatient Diabetes Program Recommendations:    Novolog 10 units TID with meals if eats at least 50% Semglee 44 units QHS   Secure chat sent to Dr. Lucianne Muss  Will continue to follow while inpatient.  Thank you, Dulce Sellar, RN, BSN Diabetes Coordinator Inpatient Diabetes Program [phone] (team pager from 8a-5p)

## 2021-09-17 NOTE — Progress Notes (Signed)
Physical Therapy Treatment Patient Details Name: Austin [name] MRN: [id number] DOB: November 26, 1955 Today's Date: 09/17/2021   History of Present Illness 65 y.o. male admitted to hospital with progressive swelling redness of the left middle finger with left hand edema. X-ray of the left hand did not show any fracture or dislocation but soft tissue swelling pt is s/p L long finger I&D on 12/2 at bedside, to return to OR for second I&D 12/5. s/p I&D with Ray amputation 12/9.  PMH: coronary artery disease, diabetes mellitus type 2, history of DVT, GERD, hyperlipidemia, hypertension and peripheral vascular disease, L BKA    PT Comments    Pt more alert and oriented this session compared to last week. Sit to stand using Stedy with +2 max assistance. Pt only able to stand briefly, for a few seconds. Pt was soiled with BM, assisted with clean up. Skin tear noted L inner buttock, RN notified.     Recommendations for follow up therapy are one component of a multi-disciplinary discharge planning process, led by the attending physician.  Recommendations may be updated based on patient status, additional functional criteria and insurance authorization.  Follow Up Recommendations  Skilled nursing-short term rehab (<3 hours/day)     Assistance Recommended at Discharge Intermittent Supervision/Assistance  Equipment Recommendations  None recommended by PT    Recommendations for Other Services       Precautions / Restrictions Precautions Precautions: Fall Precaution Comments: left hand infection, L BKA Restrictions Weight Bearing Restrictions: Yes LUE Weight Bearing: Non weight bearing LLE Weight Bearing: Non weight bearing Other Position/Activity Restrictions: pt reports he hasn't worn L prosthesis for "awhile" due to a sore on his leg     Mobility  Bed Mobility Overal bed mobility: Needs Assistance Bed Mobility: Supine to Sit     Supine to sit: Mod assist     General bed mobility  comments: increased time, used bedrail, assist to raise trunk and then to steady once in sitting    Transfers Overall transfer level: Needs assistance   Transfers: Sit to/from Stand;Bed to chair/wheelchair/BSC Sit to Stand: +2 physical assistance;Max assist           General transfer comment: assist to rise, attampted with RW however pt unable to come to full upright position, then attempted with Stedy, pt able to come to standing position briefly then sat on flaps of Stedy, Pt soiled with BM so stood briefly for pericare. Skin tear noted L inner buttock, applied barrier cream and notified RN. Transfer via Lift Equipment: Stedy  Ambulation/Gait               General Gait Details: unable   Marine scientist Rankin (Stroke Patients Only)       Balance Overall balance assessment: Needs assistance Sitting-balance support: Single extremity supported;Feet supported Sitting balance-Leahy Scale: Fair                                      Cognition Arousal/Alertness: Awake/alert Behavior During Therapy: WFL for tasks assessed/performed Overall Cognitive Status: Within Functional Limits for tasks assessed                                          Exercises  General Comments        Pertinent Vitals/Pain Faces Pain Scale: Hurts little more Pain Location: L hand Pain Descriptors / Indicators: Throbbing Pain Intervention(s): Limited activity within patient's tolerance;Monitored during session;Premedicated before session    Home Living                          Prior Function            PT Goals (current goals can now be found in the care plan section) Acute Rehab PT Goals PT Goal Formulation: Patient unable to participate in goal setting Time For Goal Achievement: 09/24/21 Potential to Achieve Goals: Fair Progress towards PT goals: Progressing toward goals     Frequency    Min 2X/week      PT Plan Current plan remains appropriate    Co-evaluation              AM-PAC PT "6 Clicks" Mobility   Outcome Measure  Help needed turning from your back to your side while in a flat bed without using bedrails?: A Little Help needed moving from lying on your back to sitting on the side of a flat bed without using bedrails?: A Lot Help needed moving to and from a bed to a chair (including a wheelchair)?: Total Help needed standing up from a chair using your arms (e.g., wheelchair or bedside chair)?: Total Help needed to walk in hospital room?: Total Help needed climbing 3-5 steps with a railing? : Total 6 Click Score: 9    End of Session Equipment Utilized During Treatment: Gait belt Activity Tolerance: Patient tolerated treatment well Patient left: in chair;with call bell/phone within reach;with chair alarm set Nurse Communication: Mobility status;Need for lift equipment;Other (comment) (skin tear L inner buttock) PT Visit Diagnosis: Other abnormalities of gait and mobility (R26.89)     Time: 2876-8115 PT Time Calculation (min) (ACUTE ONLY): 20 min  Charges:  $Therapeutic Activity: 8-22 mins                    Ralene Bathe Kistler PT 09/17/2021  Acute Rehabilitation Services Pager [phone] Office [phone]

## 2021-09-17 NOTE — Care Management Important Message (Signed)
Important Message  Patient Details IM Letter given to the Patient. Name: Austin [name] MRN: [id number] Date of Birth: December 07, 1955   Medicare Important Message Given:  Yes     Caren Macadam 09/17/2021, 11:01 AM

## 2021-09-17 NOTE — Progress Notes (Signed)
3 Days Post-Op  Subjective: 65 year old male status post ray amputation of left long finger.  Patient is resting in the bedside chair today, no specific complaints.  Denies any fevers or chills.  Objective: Vital signs in last 24 hours: Temp:  [97.6 F (36.4 C)-97.9 F (36.6 C)] 97.9 F (36.6 C) (12/12 1313) Pulse Rate:  [73-100] 100 (12/12 1313) Resp:  [14-18] 18 (12/12 1313) BP: (120-131)/(73-77) 120/73 (12/12 1313) SpO2:  [95 %-98 %] 95 % (12/12 1313) Last BM Date: 09/16/21 (small)  Intake/Output from previous day: 12/11 0701 - 12/12 0700 In: 1300 [P.O.:1200; IV Piggyback:100] Out: 2750 [Urine:2750] Intake/Output this shift: Total I/O In: 480 [P.O.:480] Out: -   General appearance: alert, cooperative, no distress, and resting in bedside chair Extremities: Left upper extremity: Palpable radial pulse.  Left hand wound with packing in place, this was removed.  Healthy granulation tissue noted within the wound bed.  No overt purulence noted.  No cellulitic changes.  The base of the wound is tender with packing, but no significant tenderness with palpation of the hand.  Do not appreciate any foul odors.  Lab Results:  CBC Latest Ref Rng & Units 09/15/2021 09/13/2021 09/12/2021  WBC 4.0 - 10.5 K/uL 14.3(H) 10.5 13.3(H)  Hemoglobin 13.0 - 17.0 g/dL 12.2(L) 13.4 12.4(L)  Hematocrit 39.0 - 52.0 % 37.5(L) 39.1 38.6(L)  Platelets 150 - 400 K/uL 292 260 277    BMET Recent Labs    09/16/21 0626 09/17/21 0524  NA 134* 133*  K 4.3 4.9  CL 100 101  CO2 27 27  GLUCOSE 250* 279*  BUN 12 12  CREATININE 0.74 0.69  CALCIUM 8.6* 8.5*   PT/INR No results for input(s): LABPROT, INR in the last 72 hours. ABG No results for input(s): PHART, HCO3 in the last 72 hours.  Invalid input(s): PCO2, PO2  Studies/Results: No results found.  Anti-infectives: Anti-infectives (From admission, onward)    Start     Dose/Rate Route Frequency Ordered Stop   09/17/21 1400  Ampicillin-Sulbactam  (UNASYN) 3 g in sodium chloride 0.9 % 100 mL IVPB        3 g 200 mL/hr over 30 Minutes Intravenous Every 6 hours 09/17/21 0845     09/10/21 1300  ceFAZolin (ANCEF) IVPB 2g/100 mL premix  Status:  Discontinued        2 g 200 mL/hr over 30 Minutes Intravenous On call to O.R. 09/10/21 1236 09/10/21 1843   09/10/21 0600  ceFAZolin (ANCEF) IVPB 1 g/50 mL premix  Status:  Discontinued        1 g 100 mL/hr over 30 Minutes Intravenous Every 8 hours 09/09/21 1342 09/17/21 0845   09/07/21 0900  cefTRIAXone (ROCEPHIN) 2 g in sodium chloride 0.9 % 100 mL IVPB  Status:  Discontinued        2 g 200 mL/hr over 30 Minutes Intravenous Every 24 hours 09/06/21 1716 09/09/21 1342   09/07/21 0630  cefTRIAXone (ROCEPHIN) 2 g in sodium chloride 0.9 % 100 mL IVPB  Status:  Discontinued        2 g 200 mL/hr over 30 Minutes Intravenous  Once 09/06/21 1709 09/06/21 1716   09/06/21 2000  vancomycin (VANCOREADY) IVPB 1750 mg/350 mL  Status:  Discontinued        1,750 mg 175 mL/hr over 120 Minutes Intravenous Every 12 hours 09/06/21 0635 09/08/21 0936   09/06/21 0630  cefTRIAXone (ROCEPHIN) 2 g in sodium chloride 0.9 % 100 mL IVPB  2 g 200 mL/hr over 30 Minutes Intravenous  Once 09/06/21 0617 09/06/21 0921   09/06/21 0630  vancomycin (VANCOREADY) IVPB 2000 mg/400 mL        2,000 mg 200 mL/hr over 120 Minutes Intravenous  Once 09/06/21 0622 09/06/21 1120       Assessment/Plan: s/p Procedure(s): AMPUTATION DIGIT left long finger and irrigation and debridement 65 year old male status post ray amputation of left long finger with Dr. Claudia Desanctis on 09/14/2021.  Intraoperative cultures are showing few Prevotella Bivia, beta-lactamase positive.  Final report status is pending.  Patient may benefit from consult to ID for specific recommendations.  Recommend continuing with packing gauze daily.  Recommend continue to elevate the hand as able.  May be stable for discharge once ID consult and recommendations are provided  for any further need for antibiotics.  We will continue to monitor.  Patient would benefit from assistance with setting up home health for when discharged home - we will place consult to transition of care team.   LOS: 11 days    Charlies Constable, PA-C 09/17/2021

## 2021-09-17 NOTE — Progress Notes (Signed)
PROGRESS NOTE    Austin [name]  [id number] DOB: 01-26-1956 DOA: 09/05/2021  PCP: Theotis Burrow, MD   Brief Narrative:  This 65 years old male with PMH significant for CAD, diabetes type 2, history of DVT, GERD, hyperlipidemia, hypertension, peripheral vascular disease presented in the ED with progressive swelling,  redness of the left middle finger with left hand edema for 5 to 6 days.  Patient has initially developed an ulcer on the tip of left middle finger without an obvious trauma and symptoms continued to get worse.  X-ray of the left hand did not show any fracture or dislocation but soft tissue swelling.  Patient was started on vancomycin and ceftriaxone and admitted for further evaluation.  Plastic surgery was consulted,  Patient underwent amputation of the left middle finger.  Assessment & Plan:   Principal Problem:   Infection of left hand Active Problems:   Essential hypertension   Hyperlipidemia   Type 2 diabetes mellitus with diabetic peripheral angiopathy without gangrene (HCC)   Coronary artery disease   Lactic acidosis   Peripheral vascular disease (HCC)   Pressure injury of coccygeal region, stage 1  Left middle finger infection with cellulitis of left dorsal hand: Patient was seen by plastic surgery and underwent incision and drainage at bedside in the ED.   Status post Left long finger amputation at the PIP joint and incision and drainage of the flexor tendon sheath on 09/10/2021.  Patient remains afebrile, Leucocytosis trending down.  Continue IV cefazolin, adequate pain control with oxycodone. Follow orthopedic recommendations.  Cultures so far no growth. Continue local wound care with daily xeroform, 4 x 4 guaze, ace wrap. After dressing change it was noted the swelling has worsened, there was a purulent drainage noted. Plastic surgery has recommended further amputation of left long finger and incision and drainage with Dr. Claudia Desanctis 12/09 Patient  underwent ray amputation of the left long finger on 12/10 for progressing infection with gangrene. Follow-up intraoperative cultures.   Essential hypertension: Continue losartan. Continue as needed hydralazine.  Hyperlipidemia: Continue Lipitor.  Coronary artery disease: Continue aspirin, losartan and statin.  Type 2 diabetes with hyperglycemia: Hemoglobin A1c 12.2.  Poorly controlled Increase Semglee 44 units daily Continue regular insulin sliding scale. Dietitian consult   Pressure injury of coccygeal region, stage 1 POA Continue wound care protocol. Continue prevention, frequent posture change every 4 hours    DVT prophylaxis: Eliquis Code Status: Full code Family Communication: No family at bedside Disposition Plan:   Status is: Inpatient  Remains inpatient appropriate because: Infection of the left middle finger with necrosis requiring debridement and IV antibiotics.  Patient is requiring further amputation of the left long finger.  Patient is medically clear for discharge: No  Consultants:  Orthopedics  Procedures:  Left long finger amputation at the PIP joint. Incision and drainage of the flexor tendon sheath left long finger   Antimicrobials:  Anti-infectives (From admission, onward)    Start     Dose/Rate Route Frequency Ordered Stop   09/17/21 1400  Ampicillin-Sulbactam (UNASYN) 3 g in sodium chloride 0.9 % 100 mL IVPB        3 g 200 mL/hr over 30 Minutes Intravenous Every 6 hours 09/17/21 0845     09/10/21 1300  ceFAZolin (ANCEF) IVPB 2g/100 mL premix  Status:  Discontinued        2 g 200 mL/hr over 30 Minutes Intravenous On call to O.R. 09/10/21 1236 09/10/21 1843   09/10/21 0600  ceFAZolin (ANCEF)  IVPB 1 g/50 mL premix  Status:  Discontinued        1 g 100 mL/hr over 30 Minutes Intravenous Every 8 hours 09/09/21 1342 09/17/21 0845   09/07/21 0900  cefTRIAXone (ROCEPHIN) 2 g in sodium chloride 0.9 % 100 mL IVPB  Status:  Discontinued        2  g 200 mL/hr over 30 Minutes Intravenous Every 24 hours 09/06/21 1716 09/09/21 1342   09/07/21 0630  cefTRIAXone (ROCEPHIN) 2 g in sodium chloride 0.9 % 100 mL IVPB  Status:  Discontinued        2 g 200 mL/hr over 30 Minutes Intravenous  Once 09/06/21 1709 09/06/21 1716   09/06/21 2000  vancomycin (VANCOREADY) IVPB 1750 mg/350 mL  Status:  Discontinued        1,750 mg 175 mL/hr over 120 Minutes Intravenous Every 12 hours 09/06/21 0635 09/08/21 0936   09/06/21 0630  cefTRIAXone (ROCEPHIN) 2 g in sodium chloride 0.9 % 100 mL IVPB        2 g 200 mL/hr over 30 Minutes Intravenous  Once 09/06/21 0617 09/06/21 0921   09/06/21 0630  vancomycin (VANCOREADY) IVPB 2000 mg/400 mL        2,000 mg 200 mL/hr over 120 Minutes Intravenous  Once 09/06/21 0622 09/06/21 1120       Subjective: Patient was seen and examined at bedside.  Overnight events noted.   Patient underwent ray amputation of left long finger 12/10 tolerated well.   Patient reports pain is manageable. His left hand is covered in dressing.   Objective: Vitals:   09/16/21 1417 09/16/21 2101 09/17/21 0643 09/17/21 1313  BP: 106/64 126/77 131/77 120/73  Pulse: 68 73 81 100  Resp: 16 14 14 18   Temp: 97.9 F (36.6 C) 97.6 F (36.4 C) 97.8 F (36.6 C) 97.9 F (36.6 C)  TempSrc: Oral Oral Oral   SpO2: 91% 98% 95% 95%  Weight:      Height:        Intake/Output Summary (Last 24 hours) at 09/17/2021 1438 Last data filed at 09/17/2021 0900 Gross per 24 hour  Intake 1060 ml  Output 1750 ml  Net -690 ml   Filed Weights   09/06/21 0607 09/06/21 0744 09/14/21 2010  Weight: 111.1 kg 110.7 kg 103.3 kg    Examination:  General exam: Appears comfortable but remains in a lot of pain.  Deconditioned Respiratory system: Clear to auscultation bilaterally. Respiratory effort normal.  RR 15 Cardiovascular system: S1-S2 heard, regular rate and rhythm, no murmur. Gastrointestinal system: Abdomen is soft, nontender, nondistended,  BS+ Central nervous system: Alert and oriented x 3. No focal neurological deficits. Extremities: Left hand middle finger amputated,  remained in dressing. Skin: No rashes, lesions or ulcers Psychiatry: Judgement and insight appear normal. Mood & affect appropriate.     Data Reviewed: I have personally reviewed following labs and imaging studies  CBC: Recent Labs  Lab 09/11/21 0340 09/12/21 0829 09/13/21 0342 09/15/21 0626  WBC 16.3* 13.3* 10.5 14.3*  HGB 12.8* 12.4* 13.4 12.2*  HCT 38.3* 38.6* 39.1 37.5*  MCV 83.4 85.6 82.7 86.0  PLT 283 277 260 [id number]   Basic Metabolic Panel: Recent Labs  Lab 09/11/21 0340 09/12/21 0829 09/13/21 0342 09/15/21 0626 09/16/21 0626 09/17/21 0524  NA 130* 134* 133* 132* 134* 133*  K 4.2 4.1 4.3 4.7 4.3 4.9  CL 98 101 100 99 100 101  CO2 24 25 25 24 27 27   GLUCOSE 282* 202* 271*  249* 250* 279*  BUN 13 13 14 12 12 12   CREATININE 0.76 0.68 0.69 0.70 0.74 0.69  CALCIUM 8.6* 8.4* 8.4* 8.5* 8.6* 8.5*  MG 2.0  --  1.9  --   --   --   PHOS  --   --  2.8  --   --   --    GFR: Estimated Creatinine Clearance: 111.5 mL/min (by C-G formula based on SCr of 0.69 mg/dL). Liver Function Tests: No results for input(s): AST, ALT, ALKPHOS, BILITOT, PROT, ALBUMIN in the last 168 hours.  No results for input(s): LIPASE, AMYLASE in the last 168 hours. No results for input(s): AMMONIA in the last 168 hours. Coagulation Profile: No results for input(s): INR, PROTIME in the last 168 hours. Cardiac Enzymes: No results for input(s): CKTOTAL, CKMB, CKMBINDEX, TROPONINI in the last 168 hours. BNP (last 3 results) No results for input(s): PROBNP in the last 8760 hours. HbA1C: No results for input(s): HGBA1C in the last 72 hours. CBG: Recent Labs  Lab 09/16/21 1242 09/16/21 1708 09/16/21 2105 09/17/21 0715 09/17/21 1129  GLUCAP 334* 272* 315* 220* 285*   Lipid Profile: No results for input(s): CHOL, HDL, LDLCALC, TRIG, CHOLHDL, LDLDIRECT in the last 72  hours. Thyroid Function Tests: No results for input(s): TSH, T4TOTAL, FREET4, T3FREE, THYROIDAB in the last 72 hours. Anemia Panel: No results for input(s): VITAMINB12, FOLATE, FERRITIN, TIBC, IRON, RETICCTPCT in the last 72 hours. Sepsis Labs: No results for input(s): PROCALCITON, LATICACIDVEN in the last 168 hours.   Recent Results (from the past 240 hour(s))  Aerobic/Anaerobic Culture w Gram Stain (surgical/deep wound)     Status: None   Collection Time: 09/10/21  5:27 PM   Specimen: PATH Soft tissue  Result Value Ref Range Status   Specimen Description   Final    TISSUE LEFT LONG FINGER Performed at [facility] [address] [place], [place] [address]    Special Requests   Final    NONE Performed at [facility], [place] [address] [place], [place] [address]    Gram Stain   Final    NO SQUAMOUS EPITHELIAL CELLS SEEN NO WBC SEEN NO ORGANISMS SEEN    Culture   Final    FEW PREVOTELLA BIVIA BETA LACTAMASE POSITIVE Performed at [facility], [place] [address] [place], [place] [address]    Report Status 09/14/2021 FINAL  Final  Surgical pcr screen     Status: None   Collection Time: 09/14/21  2:03 PM   Specimen: Nasal Mucosa; Nasal Swab  Result Value Ref Range Status   MRSA, PCR NEGATIVE NEGATIVE Final   Staphylococcus aureus NEGATIVE NEGATIVE Final    Comment: (NOTE) The Xpert SA Assay (FDA approved for NASAL specimens in patients 75 years of age and older), is one component of a comprehensive surveillance program. It is not intended to diagnose infection nor to guide or monitor treatment. Performed at [facility], [place] [address] [place], [place] [address]   Aerobic/Anaerobic Culture w Gram Stain (surgical/deep wound)     Status: None (Preliminary result)   Collection Time: 09/14/21  6:35 PM   Specimen: Wound  Result Value Ref Range Status   Specimen Description   Final    WOUND LEFT FINGER  MIDDLE Performed at [facility] [address], [place], [place] [address]    Special Requests   Final    NONE Performed at [facility], [address] [place], [place] [address]    Gram Stain   Final    ABUNDANT WBC PRESENT,BOTH PMN AND MONONUCLEAR ABUNDANT GRAM POSITIVE COCCI FEW GRAM POSITIVE RODS RARE GRAM NEGATIVE RODS    Culture   Final    NO AEROBIC GROWTH 3 DAYS FEW PREVOTELLA BIVIA BETA LACTAMASE POSITIVE Performed at [facility], [address], [place], [place] [address]    Report Status PENDING  Incomplete    Radiology Studies: No results found.  Scheduled Meds:  apixaban  5 mg Oral BID   aspirin EC  81 mg Oral Daily   atorvastatin  80 mg Oral q1800   docusate sodium  100 mg Oral BID   insulin aspart  0-20 Units Subcutaneous TID WC   insulin aspart  0-5 Units Subcutaneous QHS   insulin aspart  5 Units Subcutaneous TID WC   insulin glargine-yfgn  44 Units Subcutaneous QHS   losartan  25 mg Oral Daily   nystatin   Topical TID   polyethylene glycol  17 g Oral Daily   povidone-Iodine   Topical BID   Continuous Infusions:  sodium chloride 10 mL/hr at 09/13/21 1225   ampicillin-sulbactam (UNASYN) IV 3 g (09/17/21 1312)     LOS: 11 days    Time spent: 25 mins    Natalya Domzalski, MD [facility]   If 7PM-7AM, please contact night-coverage

## 2021-09-17 NOTE — Plan of Care (Signed)
  Problem: Education: Goal: Knowledge of General Education information will improve Description: Including pain rating scale, medication(s)/side effects and non-pharmacologic comfort measures Outcome: Progressing   Problem: Clinical Measurements: Goal: Ability to maintain clinical measurements within normal limits will improve Outcome: Progressing Goal: Will remain free from infection Outcome: Progressing Goal: Diagnostic test results will improve Outcome: Progressing Goal: Respiratory complications will improve Outcome: Progressing Goal: Cardiovascular complication will be avoided Outcome: Progressing   Problem: Health Behavior/Discharge Planning: Goal: Ability to manage health-related needs will improve Outcome: Progressing   Problem: Coping: Goal: Level of anxiety will decrease Outcome: Progressing   Problem: Elimination: Goal: Will not experience complications related to bowel motility Outcome: Progressing Goal: Will not experience complications related to urinary retention Outcome: Progressing   Problem: Pain Managment: Goal: General experience of comfort will improve Outcome: Progressing   Problem: Skin Integrity: Goal: Risk for impaired skin integrity will decrease Outcome: Progressing   Problem: Safety: Goal: Ability to remain free from injury will improve Outcome: Progressing   Problem: Pain Managment: Goal: General experience of comfort will improve Outcome: Progressing   Problem: Education: Goal: Ability to describe self-care measures that may prevent or decrease complications (Diabetes Survival Skills Education) will improve Outcome: Progressing Goal: Individualized Educational Video(s) Outcome: Progressing   Problem: Coping: Goal: Ability to adjust to condition or change in health will improve Outcome: Progressing   Problem: Fluid Volume: Goal: Ability to maintain a balanced intake and output will improve Outcome: Progressing   Problem: Health  Behavior/Discharge Planning: Goal: Ability to identify and utilize available resources and services will improve Outcome: Progressing Goal: Ability to manage health-related needs will improve Outcome: Progressing   Problem: Skin Integrity: Goal: Risk for impaired skin integrity will decrease Outcome: Progressing

## 2021-09-18 LAB — GLUCOSE, CAPILLARY
Glucose-Capillary: 162 mg/dL — ABNORMAL HIGH (ref 70–99)
Glucose-Capillary: 199 mg/dL — ABNORMAL HIGH (ref 70–99)
Glucose-Capillary: 178 mg/dL — ABNORMAL HIGH (ref 70–99)
Glucose-Capillary: 264 mg/dL — ABNORMAL HIGH (ref 70–99)

## 2021-09-18 LAB — SURGICAL PATHOLOGY

## 2021-09-18 MED ORDER — AMOXICILLIN-POT CLAVULANATE 875-125 MG PO TABS
1.0000 | ORAL_TABLET | Freq: Two times a day (BID) | ORAL | Status: DC
  Administered 2021-09-18 – 2021-09-19 (×3): 1 via ORAL
  Filled 2021-09-18 (×3): qty 1

## 2021-09-18 NOTE — Consult Note (Signed)
[facility] for Infectious Disease       Reason for Consult:gangrene    Referring Physician: Dr. Dwyane Dee  Principal Problem:   Infection of left hand Active Problems:   Essential hypertension   Hyperlipidemia   Type 2 diabetes mellitus with diabetic peripheral angiopathy without gangrene (HCC)   Coronary artery disease   Lactic acidosis   Peripheral vascular disease (HCC)   Pressure injury of coccygeal region, stage 1    amoxicillin-clavulanate  1 tablet Oral Q12H   apixaban  5 mg Oral BID   aspirin EC  81 mg Oral Daily   atorvastatin  80 mg Oral q1800   docusate sodium  100 mg Oral BID   insulin aspart  0-20 Units Subcutaneous TID WC   insulin aspart  0-5 Units Subcutaneous QHS   insulin aspart  5 Units Subcutaneous TID WC   insulin glargine-yfgn  44 Units Subcutaneous QHS   losartan  25 mg Oral Daily   nystatin   Topical TID   polyethylene glycol  17 g Oral Daily   povidone-Iodine   Topical BID    Recommendations: Amoxicillin/clavulanate for 3 more days   Assessment: He has a gangrenous process of his left long finger now s/p amputation by Dr. Claudia Desanctis.  There was some purulence present and growth in culture with Streptococcus, MSSA and Pevotella.  It appears from the OP report from 12/5 that the infection has been removed.    Antibiotics: Day 13 total antibiotics  HPI: Austin [name] is a 65 y.o. male with PVD, previous smoker, CAD who came in with a gangrenous finger as above.  Pictures reviewed.  He is now s/p amputation.  Cultures noted.  His only complaint now is pain at the site.  He is having no associated rash or diarrhea.  Has been on broad spectrum antibiotics.     Review of Systems:  Constitutional: negative for fevers and chills Gastrointestinal: negative for nausea and diarrhea All other systems reviewed and are negative    Past Medical History:  Diagnosis Date   Coronary artery disease    Diabetes (HCC)    DVT (deep venous thrombosis) (HCC)     GERD (gastroesophageal reflux disease)    Hyperlipidemia    Hypertension    Peripheral vascular disease (HCC)     Social History   Tobacco Use   Smoking status: Former    Packs/day: 1.00    Years: 47.00    Pack years: 47.00    Types: Cigarettes    Quit date: 01/2021    Years since quitting: 0.7   Smokeless tobacco: Never  Vaping Use   Vaping Use: Never used  Substance Use Topics   Alcohol use: No   Drug use: No    Family History  Problem Relation Age of Onset   Diabetes Mother    Coronary artery disease Father    Hypertension Sister    Leukemia Brother     No Known Allergies  Physical Exam: Constitutional: in no apparent distress  Vitals:   09/18/21 0515 09/18/21 1533  BP: (!) 131/42 (!) 143/90  Pulse: 79 75  Resp: 14 18  Temp: 98.6 F (37 C) 98.1 F (36.7 C)  SpO2: 100% 97%   EYES: anicteric Respiratory: normal respiratory effort Musculoskeletal: left hand wrapped Skin: negatives: no rash Neuro: non-focal  Lab Results  Component Value Date   WBC 14.3 (H) 09/15/2021   HGB 12.2 (L) 09/15/2021   HCT 37.5 (L) 09/15/2021  MCV 86.0 09/15/2021   PLT 292 09/15/2021    Lab Results  Component Value Date   CREATININE 0.69 09/17/2021   BUN 12 09/17/2021   NA 133 (L) 09/17/2021   K 4.9 09/17/2021   CL 101 09/17/2021   CO2 27 09/17/2021    Lab Results  Component Value Date   ALT 15 09/07/2021   AST 14 (L) 09/07/2021   ALKPHOS 68 09/07/2021     Microbiology: Recent Results (from the past 240 hour(s))  Aerobic/Anaerobic Culture w Gram Stain (surgical/deep wound)     Status: None   Collection Time: 09/10/21  5:27 PM   Specimen: PATH Soft tissue  Result Value Ref Range Status   Specimen Description   Final    TISSUE LEFT LONG FINGER Performed at [facility], [address] [place], [place] [address]    Special Requests   Final    NONE Performed at [facility], [address], [place], [place]  [address]    Gram Stain   Final    NO SQUAMOUS EPITHELIAL CELLS SEEN NO WBC SEEN NO ORGANISMS SEEN    Culture   Final    FEW PREVOTELLA BIVIA BETA LACTAMASE POSITIVE Performed at [facility], [address], [place], [place] [address]    Report Status 09/14/2021 FINAL  Final  Surgical pcr screen     Status: None   Collection Time: 09/14/21  2:03 PM   Specimen: Nasal Mucosa; Nasal Swab  Result Value Ref Range Status   MRSA, PCR NEGATIVE NEGATIVE Final   Staphylococcus aureus NEGATIVE NEGATIVE Final    Comment: (NOTE) The Xpert SA Assay (FDA approved for NASAL specimens in patients 8 years of age and older), is one component of a comprehensive surveillance program. It is not intended to diagnose infection nor to guide or monitor treatment. Performed at [facility], [address] [place], [place] [address]   Aerobic/Anaerobic Culture w Gram Stain (surgical/deep wound)     Status: None (Preliminary result)   Collection Time: 09/14/21  6:35 PM   Specimen: Wound  Result Value Ref Range Status   Specimen Description   Final    WOUND LEFT FINGER MIDDLE Performed at [facility], [address] [place], [place] [address]    Special Requests   Final    NONE Performed at [facility], [address], [place], [place] [address]    Gram Stain   Final    ABUNDANT WBC PRESENT,BOTH PMN AND MONONUCLEAR ABUNDANT GRAM POSITIVE COCCI FEW GRAM POSITIVE RODS RARE GRAM NEGATIVE RODS    Culture   Final    NO AEROBIC GROWTH 4 DAYS FEW PREVOTELLA BIVIA BETA LACTAMASE POSITIVE Performed at [facility], [address], [place], [place] [address]    Report Status PENDING  Incomplete    Gardiner Barefoot, MD [facility] Infectious [email or web address] 09/18/2021, 3:59 PM

## 2021-09-18 NOTE — Progress Notes (Addendum)
PROGRESS NOTE    Austin [name]  YSA:[id number] DOB: 1956-10-02 DOA: 09/05/2021  PCP: Preston Fleeting, MD   Brief Narrative:  This 65 years old male with PMH significant for CAD, diabetes type 2, history of DVT, GERD, hyperlipidemia, hypertension, peripheral vascular disease presented in the ED with progressive swelling,  redness of the left middle finger with left hand edema for 5 to 6 days.  Patient has initially developed an ulcer on the tip of left middle finger without an obvious trauma and symptoms continued to get worse.  X-ray of the left hand did not show any fracture or dislocation but soft tissue swelling.  Patient was started on vancomycin and ceftriaxone and admitted for further evaluation.  Plastic surgery was consulted,  Patient underwent amputation of the left middle finger.  Assessment & Plan:   Principal Problem:   Infection of left hand Active Problems:   Essential hypertension   Hyperlipidemia   Type 2 diabetes mellitus with diabetic peripheral angiopathy without gangrene (HCC)   Coronary artery disease   Lactic acidosis   Peripheral vascular disease (HCC)   Pressure injury of coccygeal region, stage 1  Left middle finger infection with cellulitis of left dorsal hand: Patient was seen by plastic surgery and underwent incision and drainage at bedside in the ED.   Status post Left long finger amputation at the PIP joint and incision and drainage of the flexor tendon sheath on 09/10/2021.  Patient remains afebrile, Leucocytosis trending down.  Continue IV cefazolin, adequate pain control with oxycodone. Follow orthopedic recommendations.  Cultures so far no growth. Continue local wound care with daily xeroform, 4 x 4 guaze, ace wrap. After dressing change it was noted the swelling has worsened, there was a purulent drainage noted. Plastic surgery has recommended further amputation of left long finger and incision and drainage with Dr. Arita Miss 12/09 Patient  underwent ray amputation of the left long finger on 12/10 for progressing infection with gangrene. Intraoperative cultures showing Prevotella Biviua.  Infectious disease consulted for antibiotic regimen for discharge.   Essential hypertension: Continue losartan. Continue as needed hydralazine.  Hyperlipidemia: Continue Lipitor.  Coronary artery disease: Continue aspirin, losartan and statin.  Type 2 diabetes with hyperglycemia: Hemoglobin A1c 12.2.  Poorly controlled Increase Semglee 44 units daily Continue regular insulin sliding scale. Dietitian consult   Pressure injury of coccygeal region, stage 1 POA Continue wound care protocol. Continue prevention, frequent posture change every 4 hours    DVT prophylaxis: Eliquis Code Status: Full code Family Communication: No family at bedside Disposition Plan:   Status is: Inpatient  Remains inpatient appropriate because: Infection of the left middle finger with necrosis requiring debridement and IV antibiotics.   Patient has underwent further amputation of the left long finger.  Patient is medically clear for discharge: No  Patient has approved bed in SNF.  Anticipated discharge 09/19/2021  Consultants:  Orthopedics  Procedures:  Left long finger amputation at the PIP joint. Incision and drainage of the flexor tendon sheath left long finger   Antimicrobials:  Anti-infectives (From admission, onward)    Start     Dose/Rate Route Frequency Ordered Stop   09/17/21 1400  Ampicillin-Sulbactam (UNASYN) 3 g in sodium chloride 0.9 % 100 mL IVPB        3 g 200 mL/hr over 30 Minutes Intravenous Every 6 hours 09/17/21 0845     09/10/21 1300  ceFAZolin (ANCEF) IVPB 2g/100 mL premix  Status:  Discontinued  2 g 200 mL/hr over 30 Minutes Intravenous On call to O.R. 09/10/21 1236 09/10/21 1843   09/10/21 0600  ceFAZolin (ANCEF) IVPB 1 g/50 mL premix  Status:  Discontinued        1 g 100 mL/hr over 30 Minutes Intravenous  Every 8 hours 09/09/21 1342 09/17/21 0845   09/07/21 0900  cefTRIAXone (ROCEPHIN) 2 g in sodium chloride 0.9 % 100 mL IVPB  Status:  Discontinued        2 g 200 mL/hr over 30 Minutes Intravenous Every 24 hours 09/06/21 1716 09/09/21 1342   09/07/21 0630  cefTRIAXone (ROCEPHIN) 2 g in sodium chloride 0.9 % 100 mL IVPB  Status:  Discontinued        2 g 200 mL/hr over 30 Minutes Intravenous  Once 09/06/21 1709 09/06/21 1716   09/06/21 2000  vancomycin (VANCOREADY) IVPB 1750 mg/350 mL  Status:  Discontinued        1,750 mg 175 mL/hr over 120 Minutes Intravenous Every 12 hours 09/06/21 0635 09/08/21 0936   09/06/21 0630  cefTRIAXone (ROCEPHIN) 2 g in sodium chloride 0.9 % 100 mL IVPB        2 g 200 mL/hr over 30 Minutes Intravenous  Once 09/06/21 0617 09/06/21 0921   09/06/21 0630  vancomycin (VANCOREADY) IVPB 2000 mg/400 mL        2,000 mg 200 mL/hr over 120 Minutes Intravenous  Once 09/06/21 0622 09/06/21 1120       Subjective: Patient was seen and examined at bedside.  Overnight events noted.   Patient underwent ray amputation of left long finger 12/10. tolerated well.   Patient reports pain is manageable.  His left hand is in dressing.   Objective: Vitals:   09/17/21 0643 09/17/21 1313 09/17/21 2147 09/18/21 0515  BP: 131/77 120/73 124/77 (!) 131/42  Pulse: 81 100 77 79  Resp: 14 18 14 14   Temp: 97.8 F (36.6 C) 97.9 F (36.6 C) 98.1 F (36.7 C) 98.6 F (37 C)  TempSrc: Oral  Oral Oral  SpO2: 95% 95% 100% 100%  Weight:      Height:        Intake/Output Summary (Last 24 hours) at 09/18/2021 1431 Last data filed at 09/18/2021 0930 Gross per 24 hour  Intake 734 ml  Output 1550 ml  Net -816 ml   Filed Weights   09/06/21 0607 09/06/21 0744 09/14/21 2010  Weight: 111.1 kg 110.7 kg 103.3 kg    Examination:  General exam: Deconditioned, not in any distress.  Appears comfortable. Respiratory system: Clear to auscultation bilaterally. Respiratory effort normal.  RR  14 Cardiovascular system: S1-S2 heard, regular rate and rhythm, no murmur. Gastrointestinal system: Abdomen is soft, nontender, nondistended, BS+ Central nervous system: Alert and oriented x 3. No focal neurological deficits. Extremities: Left hand middle finger amputated,  remained in dressing. Left BKA Skin: No rashes, lesions or ulcers Psychiatry: Judgement and insight appear normal. Mood & affect appropriate.     Data Reviewed: I have personally reviewed following labs and imaging studies  CBC: Recent Labs  Lab 09/12/21 0829 09/13/21 0342 09/15/21 0626  WBC 13.3* 10.5 14.3*  HGB 12.4* 13.4 12.2*  HCT 38.6* 39.1 37.5*  MCV 85.6 82.7 86.0  PLT 277 260 [id number]   Basic Metabolic Panel: Recent Labs  Lab 09/12/21 0829 09/13/21 0342 09/15/21 0626 09/16/21 0626 09/17/21 0524  NA 134* 133* 132* 134* 133*  K 4.1 4.3 4.7 4.3 4.9  CL 101 100 99 100 101  CO2  25 25 24 27 27   GLUCOSE 202* 271* 249* 250* 279*  BUN 13 14 12 12 12   CREATININE 0.68 0.69 0.70 0.74 0.69  CALCIUM 8.4* 8.4* 8.5* 8.6* 8.5*  MG  --  1.9  --   --   --   PHOS  --  2.8  --   --   --    GFR: Estimated Creatinine Clearance: 111.5 mL/min (by C-G formula based on SCr of 0.69 mg/dL). Liver Function Tests: No results for input(s): AST, ALT, ALKPHOS, BILITOT, PROT, ALBUMIN in the last 168 hours.  No results for input(s): LIPASE, AMYLASE in the last 168 hours. No results for input(s): AMMONIA in the last 168 hours. Coagulation Profile: No results for input(s): INR, PROTIME in the last 168 hours. Cardiac Enzymes: No results for input(s): CKTOTAL, CKMB, CKMBINDEX, TROPONINI in the last 168 hours. BNP (last 3 results) No results for input(s): PROBNP in the last 8760 hours. HbA1C: No results for input(s): HGBA1C in the last 72 hours. CBG: Recent Labs  Lab 09/17/21 1129 09/17/21 1609 09/17/21 2150 09/18/21 0804 09/18/21 1200  GLUCAP 285* 204* 258* 162* 199*   Lipid Profile: No results for input(s): CHOL,  HDL, LDLCALC, TRIG, CHOLHDL, LDLDIRECT in the last 72 hours. Thyroid Function Tests: No results for input(s): TSH, T4TOTAL, FREET4, T3FREE, THYROIDAB in the last 72 hours. Anemia Panel: No results for input(s): VITAMINB12, FOLATE, FERRITIN, TIBC, IRON, RETICCTPCT in the last 72 hours. Sepsis Labs: No results for input(s): PROCALCITON, LATICACIDVEN in the last 168 hours.   Recent Results (from the past 240 hour(s))  Aerobic/Anaerobic Culture w Gram Stain (surgical/deep wound)     Status: None   Collection Time: 09/10/21  5:27 PM   Specimen: PATH Soft tissue  Result Value Ref Range Status   Specimen Description   Final    TISSUE LEFT LONG FINGER Performed at [facility] [address], [place], [place] [address]    Special Requests   Final    NONE Performed at [facility], [place] [address], [place], [place] [address]    Gram Stain   Final    NO SQUAMOUS EPITHELIAL CELLS SEEN NO WBC SEEN NO ORGANISMS SEEN    Culture   Final    FEW PREVOTELLA BIVIA BETA LACTAMASE POSITIVE Performed at [facility], [place] [address] [place], [place] [address]    Report Status 09/14/2021 FINAL  Final  Surgical pcr screen     Status: None   Collection Time: 09/14/21  2:03 PM   Specimen: Nasal Mucosa; Nasal Swab  Result Value Ref Range Status   MRSA, PCR NEGATIVE NEGATIVE Final   Staphylococcus aureus NEGATIVE NEGATIVE Final    Comment: (NOTE) The Xpert SA Assay (FDA approved for NASAL specimens in patients 4 years of age and older), is one component of a comprehensive surveillance program. It is not intended to diagnose infection nor to guide or monitor treatment. Performed at [facility], [place] [address], [place], [place] [address]   Aerobic/Anaerobic Culture w Gram Stain (surgical/deep wound)     Status: None (Preliminary result)   Collection Time: 09/14/21  6:35 PM   Specimen: Wound  Result Value Ref Range Status   Specimen  Description   Final    WOUND LEFT FINGER MIDDLE Performed at [place] [place] [address], [place], [place] [address]    Special Requests   Final    NONE Performed at [facility], [address] [place], [place] [address]    Gram Stain   Final    ABUNDANT WBC PRESENT,BOTH PMN AND MONONUCLEAR ABUNDANT GRAM POSITIVE COCCI FEW GRAM POSITIVE RODS RARE GRAM NEGATIVE RODS    Culture   Final    NO AEROBIC GROWTH 4 DAYS FEW PREVOTELLA BIVIA BETA LACTAMASE POSITIVE Performed at [facility], [place] [address], [place], [place] [address]    Report Status PENDING  Incomplete    Radiology Studies: No results found.  Scheduled Meds:  apixaban  5 mg Oral BID   aspirin EC  81 mg Oral Daily   atorvastatin  80 mg Oral q1800   docusate sodium  100 mg Oral BID   insulin aspart  0-20 Units Subcutaneous TID WC   insulin aspart  0-5 Units Subcutaneous QHS   insulin aspart  5 Units Subcutaneous TID WC   insulin glargine-yfgn  44 Units Subcutaneous QHS   losartan  25 mg Oral Daily   nystatin   Topical TID   polyethylene glycol  17 g Oral Daily   povidone-Iodine   Topical BID   Continuous Infusions:  sodium chloride 10 mL/hr at 09/13/21 1225   ampicillin-sulbactam (UNASYN) IV 3 g (09/18/21 0752)     LOS: 12 days    Time spent: 25 mins    Inas Avena, MD [facility]   If 7PM-7AM, please contact night-coverage

## 2021-09-19 LAB — CBC
HCT: 37.9 % — ABNORMAL LOW (ref 39.0–52.0)
Hemoglobin: 12.3 g/dL — ABNORMAL LOW (ref 13.0–17.0)
MCH: 27.8 pg (ref 26.0–34.0)
MCHC: 32.5 g/dL (ref 30.0–36.0)
MCV: 85.6 fL (ref 80.0–100.0)
Platelets: 320 10*3/uL (ref 150–400)
RBC: 4.43 MIL/uL (ref 4.22–5.81)
RDW: 13.5 % (ref 11.5–15.5)
WBC: 10.3 10*3/uL (ref 4.0–10.5)
nRBC: 0 % (ref 0.0–0.2)

## 2021-09-19 LAB — AEROBIC/ANAEROBIC CULTURE W GRAM STAIN (SURGICAL/DEEP WOUND)

## 2021-09-19 LAB — BASIC METABOLIC PANEL
Anion gap: 9 (ref 5–15)
BUN: 11 mg/dL (ref 8–23)
CO2: 24 mmol/L (ref 22–32)
Calcium: 8.8 mg/dL — ABNORMAL LOW (ref 8.9–10.3)
Chloride: 101 mmol/L (ref 98–111)
Creatinine, Ser: 0.68 mg/dL (ref 0.61–1.24)
GFR, Estimated: >60 mL/min (ref >60)
Glucose, Bld: 131 mg/dL — ABNORMAL HIGH (ref 70–99)
Potassium: 4.3 mmol/L (ref 3.5–5.1)
Sodium: 134 mmol/L — ABNORMAL LOW (ref 135–145)

## 2021-09-19 LAB — MAGNESIUM: Magnesium: 1.9 mg/dL (ref 1.7–2.4)

## 2021-09-19 LAB — RESP PANEL BY RT-PCR (FLU A&B, COVID) ARPGX2
Influenza A by PCR: NEGATIVE
Influenza B by PCR: NEGATIVE
SARS Coronavirus 2 by RT PCR: NEGATIVE

## 2021-09-19 LAB — GLUCOSE, CAPILLARY
Glucose-Capillary: 186 mg/dL — ABNORMAL HIGH (ref 70–99)
Glucose-Capillary: 174 mg/dL — ABNORMAL HIGH (ref 70–99)
Glucose-Capillary: 139 mg/dL — ABNORMAL HIGH (ref 70–99)
Glucose-Capillary: 188 mg/dL — ABNORMAL HIGH (ref 70–99)

## 2021-09-19 LAB — PHOSPHORUS: Phosphorus: 2.9 mg/dL (ref 2.5–4.6)

## 2021-09-19 MED ORDER — AMOXICILLIN-POT CLAVULANATE 875-125 MG PO TABS
1.0000 | ORAL_TABLET | Freq: Two times a day (BID) | ORAL | 0 refills | Status: AC
Start: 2021-09-19 — End: 2021-09-21

## 2021-09-19 MED ORDER — HUMALOG KWIKPEN 100 UNIT/ML ~~LOC~~ SOPN: 5.0000 [IU] | PEN_INJECTOR | Freq: Three times a day (TID) | SUBCUTANEOUS | 11 refills | Status: DC

## 2021-09-19 MED ORDER — INSULIN DETEMIR 100 UNIT/ML ~~LOC~~ SOLN: 44.0000 [IU] | Freq: Every day | SUBCUTANEOUS | 11 refills | Status: DC

## 2021-09-19 NOTE — Progress Notes (Signed)
Report called to Makena at [facility].

## 2021-09-19 NOTE — Progress Notes (Signed)
Received report on patient, did assessment, no change from previous assessment. Will continue to monitor. °

## 2021-09-19 NOTE — Discharge Summary (Signed)
Physician Discharge Summary  Austin [name] VQM:[id number] DOB: September 12, 1956 DOA: 09/05/2021  PCP: Theotis Burrow, MD  Admit date: 09/05/2021 Discharge date: 09/19/2021  Admitted From: Home Disposition: Skilled nursing facility  Recommendations for Outpatient Follow-up:  Follow up with Dr. Claudia Desanctis  Discharge Condition: Stable CODE STATUS: Full code Diet recommendation: Heart healthy, carb modified diet  Brief/Interim Summary: Austin [name] is a 65 years old male with PMH significant for CAD, diabetes type 2, history of DVT, GERD, hyperlipidemia, hypertension, peripheral vascular disease presented in the ED with progressive swelling,  redness of the left middle finger with left hand edema for 5 to 6 days.  Patient has initially developed an ulcer on the tip of left middle finger without an obvious trauma and symptoms continued to get worse.  X-ray of the left hand did not show any fracture or dislocation but soft tissue swelling.  Patient was started on vancomycin and ceftriaxone and admitted for further evaluation.  Plastic surgery was consulted. Patient underwent amputation of the left middle finger on 12/9.  Culture showed Streptococcus, MSSA, Prevotella.  Infectious disease was consulted.  Discharge Diagnoses:  Principal Problem:   Infection of left hand Active Problems:   Essential hypertension   Hyperlipidemia   Type 2 diabetes mellitus with diabetic peripheral angiopathy without gangrene (HCC)   Coronary artery disease   Lactic acidosis   Peripheral vascular disease (HCC)   Pressure injury of coccygeal region, stage 1   Left middle finger infection with cellulitis of left dorsal hand: Patient was seen by plastic surgery and underwent incision and drainage at bedside in the ED.   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  Plastic surgery has recommended further amputation of left long  finger and incision and drainage with Dr. Claudia Desanctis 09/14/2021. Intraoperative cultures showing Prevotella Biviua.  Infectious disease consulted for antibiotic regimen for discharge.  Recommended Augmentin, last day 09/21/2021   Essential hypertension: Continue losartan. Continue as needed hydralazine.   Hyperlipidemia: Continue Lipitor.   Coronary artery disease: Continue aspirin, losartan and statin.   Type 2 diabetes with hyperglycemia: Hemoglobin A1c 12.2.  Poorly controlled Increase Semglee 44 units daily   Pressure injury of coccygeal region, stage 1 POA Continue wound care protocol. Continue prevention, frequent posture change every 4 hours  Hx A Fib Continue eliquis     Discharge Instructions  Discharge Instructions     Diet - low sodium heart healthy   Complete by: As directed    Diet Carb Modified   Complete by: As directed    Discharge wound care:   Complete by: As directed    Remove dressing from left hand.  Remove packing.  Soak the hand in saline combined with Betadine for 5 to 10 minutes.  Then allow the hand to dry.  Repack the wound with iodoform gauze.  Rewrap the hand with 4 x 4's and Kerlix to hold it in place.   Increase activity slowly   Complete by: As directed       Allergies as of 09/19/2021   No Known Allergies      Medication List     STOP taking these medications    insulin aspart 100 UNIT/ML injection Commonly known as: novoLOG   insulin glargine 100 UNIT/ML injection Commonly known as: LANTUS Replaced by: insulin detemir 100 UNIT/ML injection       TAKE these medications    Accu-Chek Guide test  strip Generic drug: glucose blood   Accu-Chek Guide w/Device Kit USE 1 KIT AS DIRECTED FOUR TIMES A DAY   amoxicillin-clavulanate 875-125 MG tablet Commonly known as: AUGMENTIN Take 1 tablet by mouth every 12 (twelve) hours for 2 days.   apixaban 5 MG Tabs tablet Commonly known as: ELIQUIS Take 1 tablet (5 mg total) by mouth 2  (two) times daily.   aspirin EC 81 MG tablet Take 1 tablet (81 mg total) by mouth daily.   atorvastatin 80 MG tablet Commonly known as: LIPITOR Take 1 tablet (80 mg total) by mouth daily at 6 PM.   DSS 100 MG Caps Take 100 mg by mouth in the morning and at bedtime.   HumaLOG KwikPen 100 UNIT/ML KwikPen Generic drug: insulin lispro Inject 5 Units into the skin 3 (three) times daily before meals. What changed: how much to take   insulin detemir 100 UNIT/ML injection Commonly known as: Levemir Inject 0.44 mLs (44 Units total) into the skin at bedtime. Replaces: insulin glargine 100 UNIT/ML injection   losartan 25 MG tablet Commonly known as: COZAAR Take 25 mg by mouth daily.   TRUEplus 5-Bevel Pen Needles 31G X 6 MM Misc Generic drug: Insulin Pen Needle               Discharge Care Instructions  (From admission, onward)           Start     Ordered   09/19/21 0000  Discharge wound care:       Comments: Remove dressing from left hand.  Remove packing.  Soak the hand in saline combined with Betadine for 5 to 10 minutes.  Then allow the hand to dry.  Repack the wound with iodoform gauze.  Rewrap the hand with 4 x 4's and Kerlix to hold it in place.   09/19/21 1149            Contact information for follow-up providers     Pace, Steffanie Dunn, MD. Schedule an appointment as soon as possible for a visit in 1 week(s).   Specialty: Plastic Surgery Contact information: [place] [address] [place] [address] [phone]              Contact information for after-discharge care     Destination     [facility] Preferred SNF .   Service: Skilled Nursing Contact information: [place] [phone]                    No Known Allergies   Procedures/Studies: DG Wrist Complete Left  Result Date: 09/05/2021 CLINICAL DATA:  Left upper extremity pain. EXAM: LEFT HAND - COMPLETE 3+  VIEW; LEFT WRIST - COMPLETE 3+ VIEW COMPARISON:  None. FINDINGS: No acute fracture or dislocation. Mild osteopenia. No significant arthritic changes. The soft tissue swelling of the dorsum of the hand. No radiopaque foreign object or soft tissue gas. IMPRESSION: 1. No acute fracture or dislocation. 2. Soft tissue swelling of the hand. Electronically Signed   By: Anner Crete M.D.   On: 09/05/2021 19:48   DG Hand Complete Left  Result Date: 09/05/2021 CLINICAL DATA:  Left upper extremity pain. EXAM: LEFT HAND - COMPLETE 3+ VIEW; LEFT WRIST - COMPLETE 3+ VIEW COMPARISON:  None. FINDINGS: No acute fracture or dislocation. Mild osteopenia. No significant arthritic changes. The soft tissue swelling of the dorsum of the hand. No radiopaque foreign object or soft tissue gas. IMPRESSION: 1.  No acute fracture or dislocation. 2. Soft tissue swelling of the hand. Electronically Signed   By: Anner Crete M.D.   On: 09/05/2021 19:48   CT CHEST LUNG CA SCREEN LOW DOSE W/O CM  Result Date: 08/22/2021 CLINICAL DATA:  Lung cancer screening. Forty-seven pack-year history. Former asymptomatic smoker. EXAM: CT CHEST WITHOUT CONTRAST LOW-DOSE FOR LUNG CANCER SCREENING TECHNIQUE: Multidetector CT imaging of the chest was performed following the standard protocol without IV contrast. COMPARISON:  None. FINDINGS: Cardiovascular: Heart size is normal. Aortic atherosclerosis. Coronary artery calcification. Mediastinum/Nodes: No enlarged mediastinal, hilar, or axillary lymph nodes. Thyroid gland, trachea, and esophagus demonstrate no significant findings. Lungs/Pleura: Paraseptal and centrilobular emphysema. No pleural effusion, airspace consolidation, or atelectasis. There are 2 tiny lung nodules identified. The largest is in the subpleural aspect of the posterior right upper lobe with a mean derived diameter of 3.9 mm. Upper Abdomen: No acute abnormality. Musculoskeletal: No chest wall mass or suspicious bone lesions  identified. IMPRESSION: 1. Lung-RADS 2, benign appearance or behavior. Continue annual screening with low-dose chest CT without contrast in 12 months. 2. Coronary artery calcifications. 3. Aortic Atherosclerosis (ICD10-I70.0) and Emphysema (ICD10-J43.9). Electronically Signed   By: Kerby Moors M.D.   On: 08/22/2021 14:06      Discharge Exam: Vitals:   09/18/21 2121 09/19/21 0426  BP: 116/85 121/79  Pulse: (!) 58 72  Resp: 16 18  Temp: 98.4 F (36.9 C) 98.5 F (36.9 C)  SpO2: 96% 97%    General: Pt is alert, awake, not in acute distress Cardiovascular: RRR, S1/S2 +, no edema Respiratory: CTA bilaterally, no wheezing, no rhonchi, no respiratory distress, no conversational dyspnea  Abdominal: Soft, NT, ND, bowel sounds + Extremities: no edema, no cyanosis, left BKA, left third finger amputation with clean and dry dressing  Psych: Normal mood and affect, stable judgement and insight     The results of significant diagnostics from this hospitalization (including imaging, microbiology, ancillary and laboratory) are listed below for reference.     Microbiology: Recent Results (from the past 240 hour(s))  Aerobic/Anaerobic Culture w Gram Stain (surgical/deep wound)     Status: None   Collection Time: 09/10/21  5:27 PM   Specimen: PATH Soft tissue  Result Value Ref Range Status   Specimen Description   Final    TISSUE LEFT LONG FINGER Performed at [facility] [address], [place], [place] [address]    Special Requests   Final    NONE Performed at [facility], [place] [address], [place], [place] [address]    Gram Stain   Final    NO SQUAMOUS EPITHELIAL CELLS SEEN NO WBC SEEN NO ORGANISMS SEEN    Culture   Final    FEW PREVOTELLA BIVIA BETA LACTAMASE POSITIVE Performed at [facility], [place] [address], [place], [place] [address]    Report Status 09/14/2021 FINAL  Final  Surgical pcr screen     Status: None   Collection Time:  09/14/21  2:03 PM   Specimen: Nasal Mucosa; Nasal Swab  Result Value Ref Range Status   MRSA, PCR NEGATIVE NEGATIVE Final   Staphylococcus aureus NEGATIVE NEGATIVE Final    Comment: (NOTE) The Xpert SA Assay (FDA approved for NASAL specimens in patients 49 years of age and older), is one component of a comprehensive surveillance program. It is not intended to diagnose infection nor to guide or monitor treatment. Performed at [facility], [place] [address] [place], [place]  [address]   Aerobic/Anaerobic Culture w Gram Stain (surgical/deep wound)     Status: None   Collection Time: 09/14/21  6:35 PM   Specimen: Wound  Result Value Ref Range Status   Specimen Description   Final    WOUND LEFT FINGER MIDDLE Performed at [place] [address], [place], [place] [address]    Special Requests   Final    NONE Performed at [facility], [place] [address], [place], [place] [address]    Gram Stain   Final    ABUNDANT WBC PRESENT,BOTH PMN AND MONONUCLEAR ABUNDANT GRAM POSITIVE COCCI FEW GRAM POSITIVE RODS RARE GRAM NEGATIVE RODS    Culture   Final    FEW PREVOTELLA BIVIA BETA LACTAMASE POSITIVE Performed at [facility], [place] [address] [place], [place] [address]    Report Status 09/19/2021 FINAL  Final  Resp Panel by RT-PCR (Flu A&B, Covid) Nasopharyngeal Swab     Status: None   Collection Time: 09/19/21  8:36 AM   Specimen: Nasopharyngeal Swab; Nasopharyngeal(NP) swabs in vial transport medium  Result Value Ref Range Status   SARS Coronavirus 2 by RT PCR NEGATIVE NEGATIVE Final    Comment: (NOTE) SARS-CoV-2 target nucleic acids are NOT DETECTED.  The SARS-CoV-2 RNA is generally detectable in upper respiratory specimens during the acute phase of infection. The lowest concentration of SARS-CoV-2 viral copies this assay can detect is 138 copies/mL. A negative result does not preclude SARS-Cov-2 infection and should  not be used as the sole basis for treatment or other patient management decisions. A negative result may occur with  improper specimen collection/handling, submission of specimen other than nasopharyngeal swab, presence of viral mutation(s) within the areas targeted by this assay, and inadequate number of viral copies(<138 copies/mL). A negative result must be combined with clinical observations, patient history, and epidemiological information. The expected result is Negative.  Fact Sheet for Patients:  EntrepreneurPulse.com.au  Fact Sheet for Healthcare Providers:  IncredibleEmployment.be  This test is no t yet approved or cleared by the [place] FDA and  has been authorized for detection and/or diagnosis of SARS-CoV-2 by FDA under an Emergency Use Authorization (EUA). This EUA will remain  in effect (meaning this test can be used) for the duration of the COVID-19 declaration under Section 564(b)(1) of the Act, 21 U.S.C.section 360bbb-3(b)(1), unless the authorization is terminated  or revoked sooner.       Influenza A by PCR NEGATIVE NEGATIVE Final   Influenza B by PCR NEGATIVE NEGATIVE Final    Comment: (NOTE) The Xpert Xpress SARS-CoV-2/FLU/RSV plus assay is intended as an aid in the diagnosis of influenza from Nasopharyngeal swab specimens and should not be used as a sole basis for treatment. Nasal washings and aspirates are unacceptable for Xpert Xpress SARS-CoV-2/FLU/RSV testing.  Fact Sheet for Patients: EntrepreneurPulse.com.au  Fact Sheet for Healthcare Providers: IncredibleEmployment.be  This test is not yet approved or cleared by the [place] FDA and has been authorized for detection and/or diagnosis of SARS-CoV-2 by FDA under an Emergency Use Authorization (EUA). This EUA will remain in effect (meaning this test can be used) for the duration of the COVID-19 declaration under  Section 564(b)(1) of the Act, 21 U.S.C. section 360bbb-3(b)(1), unless the authorization is terminated or revoked.  Performed at [facility], [place] [address] [place], [place] [address]      Labs: BNP (last 3 results) Recent Labs    03/30/21 0958  BNP 67.6  Basic Metabolic Panel: Recent Labs  Lab 09/13/21 0342 09/15/21 0626 09/16/21 0626 09/17/21 0524 09/19/21 0545  NA 133* 132* 134* 133* 134*  K 4.3 4.7 4.3 4.9 4.3  CL 100 99 100 101 101  CO2 _0 GLUCOSE 271* 249* 250* 279* 131*  BUN _1 CREATININE 0.69 0.70 0.74 0.69 0.68  CALCIUM 8.4* 8.5* 8.6* 8.5* 8.8*  MG 1.9  --   --   --  1.9  PHOS 2.8  --   --   --  2.9   Liver Function Tests: No results for input(s): AST, ALT, ALKPHOS, BILITOT, PROT, ALBUMIN in the last 168 hours. No results for input(s): LIPASE, AMYLASE in the last 168 hours. No results for input(s): AMMONIA in the last 168 hours. CBC: Recent Labs  Lab 09/13/21 0342 09/15/21 0626 09/19/21 0545  WBC 10.5 14.3* 10.3  HGB 13.4 12.2* 12.3*  HCT 39.1 37.5* 37.9*  MCV 82.7 86.0 85.6  PLT 260 292 320   Cardiac Enzymes: No results for input(s): CKTOTAL, CKMB, CKMBINDEX, TROPONINI in the last 168 hours. BNP: Invalid input(s): POCBNP CBG: Recent Labs  Lab 09/18/21 1200 09/18/21 1738 09/18/21 2123 09/19/21 0810 09/19/21 1142  GLUCAP 199* 178* 264* 186* 174*   D-Dimer No results for input(s): DDIMER in the last 72 hours. Hgb A1c No results for input(s): HGBA1C in the last 72 hours. Lipid Profile No results for input(s): CHOL, HDL, LDLCALC, TRIG, CHOLHDL, LDLDIRECT in the last 72 hours. Thyroid function studies No results for input(s): TSH, T4TOTAL, T3FREE, THYROIDAB in the last 72 hours.  Invalid input(s): FREET3 Anemia work up No results for input(s): VITAMINB12, FOLATE, FERRITIN, TIBC, IRON, RETICCTPCT in the last 72 hours. Urinalysis    Component Value Date/Time   COLORURINE YELLOW (A)  03/30/2021 0958   APPEARANCEUR CLEAR (A) 03/30/2021 0958   APPEARANCEUR Cloudy (A) 11/20/2020 1611   LABSPEC 1.027 03/30/2021 0958   LABSPEC 1.024 11/12/2013 2001   PHURINE 5.0 03/30/2021 0958   GLUCOSEU >=500 (A) 03/30/2021 0958   GLUCOSEU >=500 11/12/2013 2001   HGBUR NEGATIVE 03/30/2021 0958   BILIRUBINUR NEGATIVE 03/30/2021 0958   BILIRUBINUR Negative 11/20/2020 1611   BILIRUBINUR Negative 11/12/2013 2001   KETONESUR NEGATIVE 03/30/2021 0958   PROTEINUR NEGATIVE 03/30/2021 0958   NITRITE NEGATIVE 03/30/2021 0958   LEUKOCYTESUR NEGATIVE 03/30/2021 0958   LEUKOCYTESUR Negative 11/12/2013 2001   Sepsis Labs Invalid input(s): PROCALCITONIN,  WBC,  LACTICIDVEN Microbiology Recent Results (from the past 240 hour(s))  Aerobic/Anaerobic Culture w Gram Stain (surgical/deep wound)     Status: None   Collection Time: 09/10/21  5:27 PM   Specimen: PATH Soft tissue  Result Value Ref Range Status   Specimen Description   Final    TISSUE LEFT LONG FINGER Performed at [facility], [place] [address] [place], [place] [address]    Special Requests   Final    NONE Performed at [facility], [place] [address], [place], [place] [address]    Gram Stain   Final    NO SQUAMOUS EPITHELIAL CELLS SEEN NO WBC SEEN NO ORGANISMS SEEN    Culture   Final    FEW PREVOTELLA BIVIA BETA LACTAMASE POSITIVE Performed at [facility], [place] [address] [place],  [address]    Report Status 09/14/2021 FINAL  Final  Surgical pcr screen     Status: None   Collection Time: 09/14/21  2:03 PM   Specimen: Nasal  Mucosa; Nasal Swab  Result Value Ref Range Status   MRSA, PCR NEGATIVE NEGATIVE Final   Staphylococcus aureus NEGATIVE NEGATIVE Final    Comment: (NOTE) The Xpert SA Assay (FDA approved for NASAL specimens in patients 52 years of age and older), is one component of a comprehensive surveillance program. It is not intended to diagnose infection nor  to guide or monitor treatment. Performed at [facility], [place] [address] [place], [place] [address]   Aerobic/Anaerobic Culture w Gram Stain (surgical/deep wound)     Status: None   Collection Time: 09/14/21  6:35 PM   Specimen: Wound  Result Value Ref Range Status   Specimen Description   Final    WOUND LEFT FINGER MIDDLE Performed at [place] [address], [place], [place] [address]    Special Requests   Final    NONE Performed at [facility], [place] [address] [place], [place] [address]    Gram Stain   Final    ABUNDANT WBC PRESENT,BOTH PMN AND MONONUCLEAR ABUNDANT GRAM POSITIVE COCCI FEW GRAM POSITIVE RODS RARE GRAM NEGATIVE RODS    Culture   Final    FEW PREVOTELLA BIVIA BETA LACTAMASE POSITIVE Performed at [facility], [place] [address] [place], [place] [address]    Report Status 09/19/2021 FINAL  Final  Resp Panel by RT-PCR (Flu A&B, Covid) Nasopharyngeal Swab     Status: None   Collection Time: 09/19/21  8:36 AM   Specimen: Nasopharyngeal Swab; Nasopharyngeal(NP) swabs in vial transport medium  Result Value Ref Range Status   SARS Coronavirus 2 by RT PCR NEGATIVE NEGATIVE Final    Comment: (NOTE) SARS-CoV-2 target nucleic acids are NOT DETECTED.  The SARS-CoV-2 RNA is generally detectable in upper respiratory specimens during the acute phase of infection. The lowest concentration of SARS-CoV-2 viral copies this assay can detect is 138 copies/mL. A negative result does not preclude SARS-Cov-2 infection and should not be used as the sole basis for treatment or other patient management decisions. A negative result may occur with  improper specimen collection/handling, submission of specimen other than nasopharyngeal swab, presence of viral mutation(s) within the areas targeted by this assay, and inadequate number of viral copies(<138 copies/mL). A negative result must be combined with clinical  observations, patient history, and epidemiological information. The expected result is Negative.  Fact Sheet for Patients:  EntrepreneurPulse.com.au  Fact Sheet for Healthcare Providers:  IncredibleEmployment.be  This test is no t yet approved or cleared by the [place] FDA and  has been authorized for detection and/or diagnosis of SARS-CoV-2 by FDA under an Emergency Use Authorization (EUA). This EUA will remain  in effect (meaning this test can be used) for the duration of the COVID-19 declaration under Section 564(b)(1) of the Act, 21 U.S.C.section 360bbb-3(b)(1), unless the authorization is terminated  or revoked sooner.       Influenza A by PCR NEGATIVE NEGATIVE Final   Influenza B by PCR NEGATIVE NEGATIVE Final    Comment: (NOTE) The Xpert Xpress SARS-CoV-2/FLU/RSV plus assay is intended as an aid in the diagnosis of influenza from Nasopharyngeal swab specimens and should not be used as a sole basis for treatment. Nasal washings and aspirates are unacceptable for Xpert Xpress SARS-CoV-2/FLU/RSV testing.  Fact Sheet for Patients: EntrepreneurPulse.com.au  Fact Sheet for Healthcare Providers: IncredibleEmployment.be  This test is not yet approved or cleared by the [place] FDA and has been authorized for detection and/or diagnosis of SARS-CoV-2 by  FDA under an Emergency Use Authorization (EUA). This EUA will remain in effect (meaning this test can be used) for the duration of the COVID-19 declaration under Section 564(b)(1) of the Act, 21 U.S.C. section 360bbb-3(b)(1), unless the authorization is terminated or revoked.  Performed at [facility], [place] [address], [place], [place] [address]      Patient was seen and examined on the day of discharge and was found to be in stable condition. Time coordinating discharge: 40 minutes including assessment and coordination of  care, as well as examination of the patient.   SIGNED:  Dessa Phi, DO [facility] 09/19/2021, 11:56 AM

## 2021-09-19 NOTE — Progress Notes (Signed)
Discharge education completed with pt and significant other at bedside. No questions or concerns at this time. Informed pt that personal wheelchair will not be able to be transported via [facility] with pt. Pt's significant other states she will be taking pt's wheelchair with her and will take to [facility]. All other belongings sent with pt.

## 2021-09-19 NOTE — TOC Transition Note (Signed)
Transition of Care [facility]) - CM/SW Discharge Note   Patient Details  Name: Austin [name] MRN: [id number] Date of Birth: 28-Jan-1956  Transition of Care [facility]) CM/SW Contact:  Bartholome Bill, RN Phone Number: 09/19/2021, 1:23 PM   Clinical Narrative:     Pt to dc to [facility] room 603B. [facility] contacted for transport. Sister Deloris called to inform of transfer. RN to call report to [phone].  Final next level of care: Skilled Nursing Facility Barriers to Discharge: Continued Medical Work up, SNF Pending bed offer   Patient Goals and CMS Choice Patient states their goals for this hospitalization and ongoing recovery are:: Go to rehab CMS Medicare.gov Compare Post Acute Care list provided to:: Patient Choice offered to / list presented to : Patient  Discharge Placement              Patient chooses bed at: [facility] Patient to be transferred to facility by: [facility] Name of family member notified: Deloris Patient and family notified of of transfer: 09/19/21  Discharge Plan and Services In-house Referral: Clinical Social Work   Post Acute Care Choice: Skilled Nursing Facility          DME Arranged: N/A DME Agency: NA                  Social Determinants of Health (SDOH) Interventions     Readmission Risk Interventions No flowsheet data found.

## 2021-09-27 ENCOUNTER — Other Ambulatory Visit: Payer: Self-pay

## 2021-09-27 ENCOUNTER — Ambulatory Visit (INDEPENDENT_AMBULATORY_CARE_PROVIDER_SITE_OTHER): Payer: Medicaid Other | Admitting: Plastic Surgery

## 2021-09-27 DIAGNOSIS — L089 Local infection of the skin and subcutaneous tissue, unspecified: Secondary | ICD-10-CM

## 2021-09-27 DIAGNOSIS — S61412D Laceration without foreign body of left hand, subsequent encounter: Secondary | ICD-10-CM

## 2021-09-27 NOTE — Progress Notes (Signed)
Patient presents postop after ray amputation of left long finger for infection and gangrene.  He feels like things are going well.  He has had a nursing facility where they are doing wound care.  On exam the area looks to be healing appropriately.  No residual swelling or erythema.  Wound bed itself has a small amount of fibrinous tissue but overall looks healthy.  His other fingers are functioning normally.  We will plan to continue wound care and see him again in 3 weeks.  All of his questions were answered.

## 2021-10-15 ENCOUNTER — Ambulatory Visit (INDEPENDENT_AMBULATORY_CARE_PROVIDER_SITE_OTHER): Payer: 59

## 2021-10-15 ENCOUNTER — Encounter (INDEPENDENT_AMBULATORY_CARE_PROVIDER_SITE_OTHER): Payer: Self-pay | Admitting: Nurse Practitioner

## 2021-10-15 ENCOUNTER — Other Ambulatory Visit: Payer: Self-pay

## 2021-10-15 ENCOUNTER — Ambulatory Visit (INDEPENDENT_AMBULATORY_CARE_PROVIDER_SITE_OTHER): Payer: 59 | Admitting: Vascular Surgery

## 2021-10-15 VITALS — BP 136/79 | HR 123 | Ht 76.0 in | Wt 245.0 lb

## 2021-10-15 DIAGNOSIS — I10 Essential (primary) hypertension: Secondary | ICD-10-CM

## 2021-10-15 DIAGNOSIS — I7025 Atherosclerosis of native arteries of other extremities with ulceration: Secondary | ICD-10-CM | POA: Diagnosis not present

## 2021-10-15 DIAGNOSIS — E782 Mixed hyperlipidemia: Secondary | ICD-10-CM

## 2021-10-15 DIAGNOSIS — L97211 Non-pressure chronic ulcer of right calf limited to breakdown of skin: Secondary | ICD-10-CM

## 2021-10-15 DIAGNOSIS — E1151 Type 2 diabetes mellitus with diabetic peripheral angiopathy without gangrene: Secondary | ICD-10-CM

## 2021-10-15 NOTE — Progress Notes (Deleted)
Patient is a 66 year old male with PMH of type II DM and left middle finger gangrene s/p amputation performed 09/14/2021 by Dr. Arita Miss with intraoperative cultures showing Prevotella Biviua who presents to clinic for postoperative follow-up.  Patient was last seen here in clinic on 09/27/2021 by Dr. Arita Miss.  At that time, exam was entirely reassuring and without any evidence otherwise concerning for infection.  He was receiving regular care at the SNF and felt improved.  Today

## 2021-10-17 ENCOUNTER — Encounter (INDEPENDENT_AMBULATORY_CARE_PROVIDER_SITE_OTHER): Payer: Self-pay | Admitting: Vascular Surgery

## 2021-10-17 NOTE — Progress Notes (Signed)
MRN : [id number]  Austin [name] is a 66 y.o. (Aug 02, 1956) male who presents with chief complaint of check circulation.  History of Present Illness:  Patient returns today in follow up of his PAD and right leg ulcerations.  He has multiple right calf and ankle ulcerations now associated with significant swelling of the leg.  He states these ulcers are relatively no and the previously documented ulcers have largely healed.  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  We have undergone multiple previous revascularizations in the past with the most recent being earlier this year.   Current Meds  Medication Sig   ACCU-CHEK GUIDE test strip    apixaban (ELIQUIS) 5 MG TABS tablet Take 1 tablet (5 mg total) by mouth 2 (two) times daily.   aspirin EC 81 MG tablet Take 1 tablet (81 mg total) by mouth daily.   atorvastatin (LIPITOR) 80 MG tablet Take 1 tablet (80 mg total) by mouth daily at 6 PM.   Blood Glucose Monitoring Suppl (ACCU-CHEK GUIDE) w/Device KIT USE 1 KIT AS DIRECTED FOUR TIMES A DAY   Docusate Sodium (DSS) 100 MG CAPS Take 100 mg by mouth in the morning and at bedtime.   HUMALOG KWIKPEN 100 UNIT/ML KwikPen Inject 5 Units into the skin 3 (three) times daily before meals.   insulin detemir (LEVEMIR) 100 UNIT/ML injection Inject 0.44 mLs (44 Units total) into the skin at bedtime.   losartan (COZAAR) 25 MG tablet Take 25 mg by mouth daily.   TRUEPLUS 5-BEVEL PEN NEEDLES 31G X 6 MM MISC     Past Medical History:  Diagnosis Date   Coronary artery disease    Diabetes (HCC)    DVT (deep venous thrombosis) (HCC)    GERD (gastroesophageal reflux disease)    Hyperlipidemia    Hypertension    Peripheral vascular disease ([place])     Past Surgical History:  Procedure Laterality Date   AMPUTATION Left 10/28/2018   Procedure: AMPUTATION BELOW KNEE;  Surgeon: Algernon Huxley, MD;  Location: [facility];  Service: Vascular;  Laterality: Left;   AMPUTATION Left 09/10/2021   Procedure: AMPUTATION DIGIT;  Surgeon: Cindra Presume, MD;  Location: [facility];  Service: Plastics;  Laterality: Left;   AMPUTATION Left 09/14/2021   Procedure: AMPUTATION DIGIT left long finger and irrigation and debridement;  Surgeon: Cindra Presume, MD;  Location: [facility];  Service: Plastics;  Laterality: Left;   AMPUTATION TOE Left 02/10/2018   Procedure: AMPUTATION TOE;  Surgeon: Sharlotte Alamo, DPM;  Location: [facility];  Service: Podiatry;  Laterality: Left;   APPLICATION OF WOUND VAC Left 10/16/2018   Procedure: APPLICATION OF WOUND VAC;  Surgeon: Samara Deist, DPM;  Location: [facility];  Service: Podiatry;  Laterality: Left;   DORSAL SLIT N/A 01/12/2021   Procedure: DORSAL SLIT;  Surgeon: Billey Co, MD;  Location: [facility];  Service: Urology;  Laterality: N/A;   groin surgery     IRRIGATION AND DEBRIDEMENT FOOT Left 09/30/2018   Procedure: IRRIGATION AND DEBRIDEMENT FOOT;  Surgeon: Sharlotte Alamo, DPM;  Location: [facility];  Service: Podiatry;  Laterality: Left;   LOWER EXTREMITY ANGIOGRAPHY Left 02/12/2018   Procedure: Lower Extremity Angiography;  Surgeon: Algernon Huxley, MD;  Location: [place] [facility];  Service: Cardiovascular;  Laterality: Left;   LOWER EXTREMITY ANGIOGRAPHY Left 10/01/2018  Procedure: Lower Extremity Angiography;  Surgeon: Algernon Huxley, MD;  Location: [place] [facility];  Service: Cardiovascular;  Laterality: Left;   LOWER EXTREMITY ANGIOGRAPHY Left 10/19/2018   Procedure: Lower Extremity Angiography;  Surgeon: Algernon Huxley, MD;  Location: [place] [facility];  Service: Cardiovascular;  Laterality: Left;   LOWER EXTREMITY ANGIOGRAPHY Left 10/20/2018   Procedure: LOWER EXTREMITY ANGIOGRAPHY;  Surgeon: Algernon Huxley, MD;  Location: [place] [facility];  Service: Cardiovascular;  Laterality: Left;   LOWER EXTREMITY ANGIOGRAPHY Right 11/25/2018   Procedure: LOWER  EXTREMITY ANGIOGRAPHY;  Surgeon: Algernon Huxley, MD;  Location: [place] [facility];  Service: Cardiovascular;  Laterality: Right;   LOWER EXTREMITY ANGIOGRAPHY Right 01/10/2020   Procedure: LOWER EXTREMITY ANGIOGRAPHY;  Surgeon: Algernon Huxley, MD;  Location: [place] [facility];  Service: Cardiovascular;  Laterality: Right;   LOWER EXTREMITY ANGIOGRAPHY Right 12/11/2020   Procedure: LOWER EXTREMITY ANGIOGRAPHY;  Surgeon: Algernon Huxley, MD;  Location: [place] [facility];  Service: Cardiovascular;  Laterality: Right;   LOWER EXTREMITY INTERVENTION  02/12/2018   Procedure: LOWER EXTREMITY INTERVENTION;  Surgeon: Algernon Huxley, MD;  Location: [place] [facility];  Service: Cardiovascular;;   MINOR IRRIGATION AND DEBRIDEMENT OF WOUND Left 09/10/2021   Procedure: IRRIGATION AND DEBRIDEMENT OF LEFT LONG FINGER WOUND;  Surgeon: Cindra Presume, MD;  Location: [facility];  Service: Plastics;  Laterality: Left;   NECK SURGERY     TRANSMETATARSAL AMPUTATION Left 10/16/2018   Procedure: TRANSMETATARSAL AMPUTATION LEFT FOOT;  Surgeon: Samara Deist, DPM;  Location: [facility];  Service: Podiatry;  Laterality: Left;    Social History Social History   Tobacco Use   Smoking status: Former    Packs/day: 1.00    Years: 47.00    Pack years: 47.00    Types: Cigarettes    Quit date: 01/2021    Years since quitting: 0.7   Smokeless tobacco: Never  Vaping Use   Vaping Use: Never used  Substance Use Topics   Alcohol use: No   Drug use: No    Family History Family History  Problem Relation Age of Onset   Diabetes Mother    Coronary artery disease Father    Hypertension Sister    Leukemia Brother     No Known Allergies   REVIEW OF SYSTEMS (Negative unless checked)  Constitutional: _0 Weight loss  _1 Fever  _2 Chills Cardiac: _3 Chest pain   _4 Chest pressure   _5 Palpitations   _6 Shortness of breath when laying flat   _7 Shortness of breath with exertion. Vascular:  _8 Pain in leg with walking   _9 Pain in legs  at rest  _10 History of DVT   _11 Phlebitis   _12 Swelling in legs   _13 Varicose veins   _14 Non-healing ulcers Pulmonary:   _15 Uses home oxygen   _16 Productive cough   _17 Hemoptysis   _18 Wheeze  _19 COPD   _20 Asthma Neurologic:  _21 Dizziness   _22 Seizures   _23 History of stroke   _24 History of TIA  _25 Aphasia   _26 Vissual changes   _27 Weakness or numbness in arm   _28 Weakness or numbness in leg Musculoskeletal:   _29 Joint swelling   _30 Joint pain   _31 Low back pain Hematologic:  _32 Easy bruising  _33 Easy bleeding   _34 Hypercoagulable state   _35 Anemic Gastrointestinal:  _36 Diarrhea   _37 Vomiting  _38 Gastroesophageal reflux/heartburn   _39 Difficulty swallowing. Genitourinary:  _40 Chronic kidney disease   _41 Difficult urination  _42 Frequent urination   _43 Blood in urine Skin:  _44 Rashes   _45 Ulcers  Psychological:  _46 History of anxiety   _47   History of major depression.  Physical Examination  Vitals:   10/15/21 1116  BP: 136/79  Pulse: (!) 123  Weight: 245 lb (111.1 kg)  Height: _0  (1.93 m)   Body mass index is 29.82 kg/m. Gen: WD/WN, NAD Head: /AT, No temporalis wasting.  Ear/Nose/Throat: Hearing grossly intact, nares w/o erythema or drainage Eyes: PER, EOMI, sclera nonicteric.  Neck: Supple, no masses.  No bruit or JVD.  Pulmonary:  Good air movement, no audible wheezing, no use of accessory muscles.  Cardiac: RRR, normal S1, S2, no Murmurs. Vascular: Several small ulcers on the right associated with a hard nonpitting edema.  The skin is somewhat shiny and red.  On the left his below-knee amputation stump has a superficial ulcer approximately the size of a nickel on the anterior portion Vessel Right Left  Radial Palpable Palpable  PT Not palpable BKA  DP Not palpable BKA  Gastrointestinal: soft, non-distended. No guarding/no peritoneal signs.  Musculoskeletal: M/S 5/5 throughout.  No visible deformity.  Neurologic: CN 2-12 intact. Pain and light touch intact in extremities.  Symmetrical.  Speech is fluent. Motor  exam as listed above. Psychiatric: Judgment intact, Mood & affect appropriate for pt's clinical situation. Dermatologic: No rashes or ulcers noted.  No changes consistent with cellulitis.   CBC Lab Results  Component Value Date   WBC 10.3 09/19/2021   HGB 12.3 (L) 09/19/2021   HCT 37.9 (L) 09/19/2021   MCV 85.6 09/19/2021   PLT 320 09/19/2021    BMET    Component Value Date/Time   NA 134 (L) 09/19/2021 0545   NA 135 (L) 11/17/2013 0621   K 4.3 09/19/2021 0545   K 4.3 11/17/2013 0621   CL 101 09/19/2021 0545   CL 99 11/17/2013 0621   CO2 24 09/19/2021 0545   CO2 31 11/17/2013 0621   GLUCOSE 131 (H) 09/19/2021 0545   GLUCOSE 172 (H) 11/17/2013 0621   BUN 11 09/19/2021 0545   BUN 10 11/17/2013 0621   CREATININE 0.68 09/19/2021 0545   CREATININE 0.85 11/17/2013 0621   CALCIUM 8.8 (L) 09/19/2021 0545   CALCIUM 9.6 11/17/2013 0621   GFRNONAA >60 09/19/2021 0545   GFRNONAA >60 11/17/2013 0621   GFRAA >60 01/10/2020 0808   GFRAA >60 11/17/2013 0621   CrCl cannot be calculated (Patient's most recent lab result is older than the maximum 21 days allowed.).  COAG Lab Results  Component Value Date   INR 1.2 03/31/2021   INR 1.10 10/28/2018   INR 1.16 10/23/2018    Radiology VAS [place] ABI WITH/WO TBI  Result Date: 10/17/2021  LOWER EXTREMITY DOPPLER STUDY Patient Name:  SLAYDE BRAULT  Date of Exam:   10/15/2021 Medical Rec #: [id number]         Accession #:    [id number] Date of Birth: [birth year]         Patient Gender: M Patient Age:   48 years Exam Location:  [facility] Procedure:      VAS [place] ABI WITH/WO TBI Referring Phys: Corene Cornea DEW --------------------------------------------------------------------------------  Indications: Peripheral artery disease.  Vascular Interventions: H/O mulitple left leg revascularizations with left BKA                         on 10/28/18;                         11/25/18: Right SFA, popliteal & TP trunk PTAs;  01/10/20: Right SFA stent with right posterior tibial &                         peroneal artery PTAs;                         12/11/20: Left EIA stent with right TP trunk & peroneal                         PTAs;. Performing Technologist: Blondell Reveal RT, RDMS, RVT  Examination Guidelines: A complete evaluation includes at minimum, Doppler waveform signals and systolic blood pressure reading at the level of bilateral brachial, anterior tibial, and posterior tibial arteries, when vessel segments are accessible. Bilateral testing is considered an integral part of a complete examination. Photoelectric Plethysmograph (PPG) waveforms and toe systolic pressure readings are included as required and additional duplex testing as needed. Limited examinations for reoccurring indications may be performed as noted.  ABI Findings: +---------+------------------+-----+-------------------+------------+  Right     Rt Pressure (mmHg) Index Waveform            Comment       +---------+------------------+-----+-------------------+------------+  Brachial  120                                                        +---------+------------------+-----+-------------------+------------+  ATA       92                 0.77  dampened monophasic               +---------+------------------+-----+-------------------+------------+  PTA                                                    not detected  +---------+------------------+-----+-------------------+------------+  PERO                                                   not detected  +---------+------------------+-----+-------------------+------------+  Great Toe 36                 0.30  Abnormal                          +---------+------------------+-----+-------------------+------------+ +-------+-----------+-----------+------------+------------+  ABI/TBI Today's ABI Today's TBI Previous ABI Previous TBI  +-------+-----------+-----------+------------+------------+  Right   0.77        0.30         0.74         0.49          +-------+-----------+-----------+------------+------------+  Left    BKA                     BKA                        +-------+-----------+-----------+------------+------------+ Right ABIs appear essentially unchanged compared to prior study on 07/13/21. Right TBIs appear decreased.  Summary: Right: Resting  right ankle-brachial index indicates moderate right lower extremity arterial disease. The right toe-brachial index is abnormal.  *See table(s) above for measurements and observations.  Electronically signed by Leotis Pain MD on 10/17/2021 at 10:08:52 AM.    Final      Assessment/Plan 1. Atherosclerosis of native arteries of the extremities with ulceration ([place])  Recommend:  The patient has evidence of atherosclerosis of the lower extremities with claudication.  The patient does not voice lifestyle limiting changes at this point in time.  His biggest complaint today is the inability to comfortably wear his left prosthesis secondary to the ulceration.  I have contacted [employer] and they will get in touch with them so the can work on fitting his prosthesis.  Noninvasive studies do not suggest clinically significant change.  No invasive studies, angiography or surgery at this time The patient should continue walking and begin a more formal exercise program.  The patient should continue antiplatelet therapy and aggressive treatment of the lipid abnormalities  No changes in the patient's medications at this time  The patient should be wearing graduated compression socks 10-15 mmHg strength to control the edema on the right.    2. Essential hypertension Continue antihypertensive medications as already ordered, these medications have been reviewed and there are no changes at this time.   3. Type 2 diabetes mellitus with diabetic peripheral angiopathy without gangrene, without long-term current use of insulin (HCC) Continue hypoglycemic medications as already ordered, these  medications have been reviewed and there are no changes at this time.  Hgb A1C to be monitored as already arranged by primary service   4. Mixed hyperlipidemia Continue statin as ordered and reviewed, no changes at this time     Hortencia Pilar, MD  10/17/2021 12:56 PM

## 2021-10-18 ENCOUNTER — Ambulatory Visit: Payer: Medicaid Other | Admitting: Physician Assistant

## 2022-01-15 ENCOUNTER — Ambulatory Visit (INDEPENDENT_AMBULATORY_CARE_PROVIDER_SITE_OTHER): Payer: 59 | Admitting: Nurse Practitioner

## 2022-02-15 ENCOUNTER — Inpatient Hospital Stay (HOSPITAL_COMMUNITY)
Admission: EM | Admit: 2022-02-15 | Discharge: 2022-03-08 | DRG: 853 | Disposition: A | Discharge status: Rehabilitation Facility | Payer: Medicare Other | Attending: Internal Medicine | Admitting: Internal Medicine

## 2022-02-15 ENCOUNTER — Emergency Department (HOSPITAL_COMMUNITY): Payer: Medicare Other

## 2022-02-15 DIAGNOSIS — E1169 Type 2 diabetes mellitus with other specified complication: Secondary | ICD-10-CM | POA: Diagnosis present

## 2022-02-15 DIAGNOSIS — Z86718 Personal history of other venous thrombosis and embolism: Secondary | ICD-10-CM

## 2022-02-15 DIAGNOSIS — A419 Sepsis, unspecified organism: Secondary | ICD-10-CM

## 2022-02-15 DIAGNOSIS — G9341 Metabolic encephalopathy: Secondary | ICD-10-CM | POA: Diagnosis not present

## 2022-02-15 DIAGNOSIS — L97516 Non-pressure chronic ulcer of other part of right foot with bone involvement without evidence of necrosis: Secondary | ICD-10-CM | POA: Diagnosis present

## 2022-02-15 DIAGNOSIS — Z7902 Long term (current) use of antithrombotics/antiplatelets: Secondary | ICD-10-CM

## 2022-02-15 DIAGNOSIS — Z89519 Acquired absence of unspecified leg below knee: Secondary | ICD-10-CM

## 2022-02-15 DIAGNOSIS — Z7901 Long term (current) use of anticoagulants: Secondary | ICD-10-CM

## 2022-02-15 DIAGNOSIS — Z833 Family history of diabetes mellitus: Secondary | ICD-10-CM

## 2022-02-15 DIAGNOSIS — Z7985 Long-term (current) use of injectable non-insulin antidiabetic drugs: Secondary | ICD-10-CM

## 2022-02-15 DIAGNOSIS — Z955 Presence of coronary angioplasty implant and graft: Secondary | ICD-10-CM

## 2022-02-15 DIAGNOSIS — E11621 Type 2 diabetes mellitus with foot ulcer: Secondary | ICD-10-CM | POA: Diagnosis present

## 2022-02-15 DIAGNOSIS — Z794 Long term (current) use of insulin: Secondary | ICD-10-CM

## 2022-02-15 DIAGNOSIS — R4182 Altered mental status, unspecified: Principal | ICD-10-CM

## 2022-02-15 DIAGNOSIS — Z7982 Long term (current) use of aspirin: Secondary | ICD-10-CM

## 2022-02-15 DIAGNOSIS — Z79899 Other long term (current) drug therapy: Secondary | ICD-10-CM

## 2022-02-15 DIAGNOSIS — A401 Sepsis due to streptococcus, group B: Secondary | ICD-10-CM | POA: Diagnosis not present

## 2022-02-15 DIAGNOSIS — I10 Essential (primary) hypertension: Secondary | ICD-10-CM | POA: Diagnosis present

## 2022-02-15 DIAGNOSIS — T82858A Stenosis of vascular prosthetic devices, implants and grafts, initial encounter: Secondary | ICD-10-CM | POA: Diagnosis present

## 2022-02-15 DIAGNOSIS — Y831 Surgical operation with implant of artificial internal device as the cause of abnormal reaction of the patient, or of later complication, without mention of misadventure at the time of the procedure: Secondary | ICD-10-CM | POA: Diagnosis present

## 2022-02-15 DIAGNOSIS — Z8249 Family history of ischemic heart disease and other diseases of the circulatory system: Secondary | ICD-10-CM

## 2022-02-15 DIAGNOSIS — E1151 Type 2 diabetes mellitus with diabetic peripheral angiopathy without gangrene: Secondary | ICD-10-CM | POA: Diagnosis present

## 2022-02-15 DIAGNOSIS — E13621 Other specified diabetes mellitus with foot ulcer: Secondary | ICD-10-CM | POA: Diagnosis not present

## 2022-02-15 DIAGNOSIS — M869 Osteomyelitis, unspecified: Secondary | ICD-10-CM | POA: Diagnosis present

## 2022-02-15 DIAGNOSIS — E872 Acidosis, unspecified: Secondary | ICD-10-CM | POA: Diagnosis present

## 2022-02-15 DIAGNOSIS — E1165 Type 2 diabetes mellitus with hyperglycemia: Secondary | ICD-10-CM | POA: Diagnosis present

## 2022-02-15 DIAGNOSIS — R6520 Severe sepsis without septic shock: Secondary | ICD-10-CM | POA: Diagnosis present

## 2022-02-15 DIAGNOSIS — L97509 Non-pressure chronic ulcer of other part of unspecified foot with unspecified severity: Secondary | ICD-10-CM

## 2022-02-15 DIAGNOSIS — I2510 Atherosclerotic heart disease of native coronary artery without angina pectoris: Secondary | ICD-10-CM | POA: Diagnosis present

## 2022-02-15 DIAGNOSIS — Z89512 Acquired absence of left leg below knee: Secondary | ICD-10-CM

## 2022-02-15 DIAGNOSIS — I739 Peripheral vascular disease, unspecified: Secondary | ICD-10-CM | POA: Diagnosis present

## 2022-02-15 DIAGNOSIS — Z87891 Personal history of nicotine dependence: Secondary | ICD-10-CM

## 2022-02-15 DIAGNOSIS — I70221 Atherosclerosis of native arteries of extremities with rest pain, right leg: Secondary | ICD-10-CM | POA: Diagnosis present

## 2022-02-15 DIAGNOSIS — Z806 Family history of leukemia: Secondary | ICD-10-CM

## 2022-02-15 DIAGNOSIS — I482 Chronic atrial fibrillation, unspecified: Secondary | ICD-10-CM | POA: Diagnosis present

## 2022-02-15 DIAGNOSIS — E785 Hyperlipidemia, unspecified: Secondary | ICD-10-CM | POA: Diagnosis present

## 2022-02-15 DIAGNOSIS — A4101 Sepsis due to Methicillin susceptible Staphylococcus aureus: Secondary | ICD-10-CM | POA: Diagnosis present

## 2022-02-15 LAB — I-STAT CHEM 8, ED
BUN: 14 mg/dL (ref 8–23)
Calcium, Ion: 1.1 mmol/L — ABNORMAL LOW (ref 1.15–1.40)
Chloride: 101 mmol/L (ref 98–111)
Creatinine, Ser: 1 mg/dL (ref 0.61–1.24)
Glucose, Bld: 296 mg/dL — ABNORMAL HIGH (ref 70–99)
HCT: 44 % (ref 39.0–52.0)
Hemoglobin: 15 g/dL (ref 13.0–17.0)
Potassium: 3.8 mmol/L (ref 3.5–5.1)
Sodium: 136 mmol/L (ref 135–145)
TCO2: 23 mmol/L (ref 22–32)

## 2022-02-15 LAB — COMPREHENSIVE METABOLIC PANEL
ALT: 18 U/L (ref 0–44)
AST: 19 U/L (ref 15–41)
Albumin: 3.1 g/dL — ABNORMAL LOW (ref 3.5–5.0)
Alkaline Phosphatase: 63 U/L (ref 38–126)
Anion gap: 10 (ref 5–15)
BUN: 14 mg/dL (ref 8–23)
CO2: 23 mmol/L (ref 22–32)
Calcium: 9.1 mg/dL (ref 8.9–10.3)
Chloride: 103 mmol/L (ref 98–111)
Creatinine, Ser: 1.24 mg/dL (ref 0.61–1.24)
GFR, Estimated: >60 mL/min (ref >60)
Glucose, Bld: 302 mg/dL — ABNORMAL HIGH (ref 70–99)
Potassium: 3.8 mmol/L (ref 3.5–5.1)
Sodium: 136 mmol/L (ref 135–145)
Total Bilirubin: 0.9 mg/dL (ref 0.3–1.2)
Total Protein: 7 g/dL (ref 6.5–8.1)

## 2022-02-15 LAB — CBC
HCT: 43.6 % (ref 39.0–52.0)
Hemoglobin: 14.4 g/dL (ref 13.0–17.0)
MCH: 27.7 pg (ref 26.0–34.0)
MCHC: 33 g/dL (ref 30.0–36.0)
MCV: 84 fL (ref 80.0–100.0)
Platelets: 164 10*3/uL (ref 150–400)
RBC: 5.19 MIL/uL (ref 4.22–5.81)
RDW: 14.7 % (ref 11.5–15.5)
WBC: 16.9 10*3/uL — ABNORMAL HIGH (ref 4.0–10.5)
nRBC: 0 % (ref 0.0–0.2)

## 2022-02-15 LAB — DIFFERENTIAL
Abs Immature Granulocytes: 0.12 10*3/uL — ABNORMAL HIGH (ref 0.00–0.07)
Basophils Absolute: 0 10*3/uL (ref 0.0–0.1)
Basophils Relative: 0 %
Eosinophils Absolute: 0 10*3/uL (ref 0.0–0.5)
Eosinophils Relative: 0 %
Immature Granulocytes: 1 %
Lymphocytes Relative: 10 %
Lymphs Abs: 1.7 10*3/uL (ref 0.7–4.0)
Monocytes Absolute: 1.3 10*3/uL — ABNORMAL HIGH (ref 0.1–1.0)
Monocytes Relative: 8 %
Neutro Abs: 13.7 10*3/uL — ABNORMAL HIGH (ref 1.7–7.7)
Neutrophils Relative %: 81 %

## 2022-02-15 LAB — APTT: aPTT: 33 seconds (ref 24–36)

## 2022-02-15 LAB — CBG MONITORING, ED: Glucose-Capillary: 265 mg/dL — ABNORMAL HIGH (ref 70–99)

## 2022-02-15 LAB — LACTIC ACID, PLASMA: Lactic Acid, Venous: 2 mmol/L (ref 0.5–1.9)

## 2022-02-15 LAB — PROTIME-INR
INR: 1.3 — ABNORMAL HIGH (ref 0.8–1.2)
Prothrombin Time: 16.5 seconds — ABNORMAL HIGH (ref 11.4–15.2)

## 2022-02-15 MED ORDER — LACTATED RINGERS IV SOLN: INTRAVENOUS | Status: DC

## 2022-02-15 MED ORDER — VANCOMYCIN HCL IN DEXTROSE 1-5 GM/200ML-% IV SOLN: 1000.0000 mg | Freq: Once | INTRAVENOUS | Status: DC

## 2022-02-15 MED ORDER — LACTATED RINGERS IV BOLUS (SEPSIS)
1000.0000 mL | Freq: Once | INTRAVENOUS | Status: AC
  Administered 2022-02-16: 1000 mL via INTRAVENOUS

## 2022-02-15 MED ORDER — VANCOMYCIN HCL 1500 MG/300ML IV SOLN
1500.0000 mg | Freq: Two times a day (BID) | INTRAVENOUS | Status: DC
  Administered 2022-02-16 – 2022-02-19 (×8): 1500 mg via INTRAVENOUS
  Filled 2022-02-15 (×10): qty 300

## 2022-02-15 MED ORDER — LACTATED RINGERS IV BOLUS (SEPSIS)
1000.0000 mL | Freq: Once | INTRAVENOUS | Status: AC
  Administered 2022-02-15: 1000 mL via INTRAVENOUS

## 2022-02-15 MED ORDER — SODIUM CHLORIDE 0.9% FLUSH: 3.0000 mL | Freq: Once | INTRAVENOUS | Status: DC

## 2022-02-15 MED ORDER — IOHEXOL 350 MG/ML SOLN
75.0000 mL | Freq: Once | INTRAVENOUS | Status: AC | PRN
  Administered 2022-02-15: 75 mL via INTRAVENOUS

## 2022-02-15 MED ORDER — VANCOMYCIN HCL 1250 MG/250ML IV SOLN: 1250.0000 mg | Freq: Two times a day (BID) | INTRAVENOUS | Status: DC

## 2022-02-15 MED ORDER — SODIUM CHLORIDE 0.9 % IV SOLN
2.0000 g | Freq: Once | INTRAVENOUS | Status: AC
  Administered 2022-02-15: 2 g via INTRAVENOUS
  Filled 2022-02-15: qty 20

## 2022-02-15 MED ORDER — VANCOMYCIN HCL 10 G IV SOLR
2250.0000 mg | Freq: Once | INTRAVENOUS | Status: DC
  Filled 2022-02-15: qty 22.5

## 2022-02-15 MED ORDER — LACTATED RINGERS IV BOLUS (SEPSIS)
500.0000 mL | Freq: Once | INTRAVENOUS | Status: AC
  Administered 2022-02-16: 500 mL via INTRAVENOUS

## 2022-02-15 MED ORDER — VANCOMYCIN HCL 2000 MG/400ML IV SOLN
2000.0000 mg | Freq: Once | INTRAVENOUS | Status: AC
  Administered 2022-02-16: 2000 mg via INTRAVENOUS
  Filled 2022-02-15: qty 400

## 2022-02-15 NOTE — Progress Notes (Addendum)
Pharmacy Antibiotic Note ? ?Austin [name] is a 66 y.o. male admitted on 02/15/2022 with cellulitis.  Pharmacy has been consulted for Vancomycin dosing. ? ?Plan: ?Vancomycin 2000 mg IV x 1, followed by 1500 mg IV q12h (eAUC 478.1, SCr 1.00, goal AUC 400-550) ?Ceftriaxone 2 g IV x 1 ?Follow-up clinical status, renal function ?Follow-up cultures, LOT, de-escalate as able ?Obtain Vancomycin levels as appropriate  ? ?Height: 6\' 4"  (193 cm) ?Weight: 111.1 kg (245 lb) ?IBW/kg (Calculated): 59.2 kg  ? ?Temp (24hrs), Avg:103.1 ?F (39.5 ?C), Min:103.1 ?F (39.5 ?C), Max:103.1 ?F (39.5 ?C) ? ?Recent Labs  ?Lab 02/15/22 ?2051 02/15/22 ?2055  ?WBC 16.9*  --   ?CREATININE 1.24 1.00  ?  ?Estimated Creatinine Clearance: 99.2 mL/min (by C-G formula based on SCr of 1 mg/dL).   ? ?Not on File ? ?Antimicrobials this admission: ?Vancomycin 5/12 >>  ?Ceftriaxone 5/12  ? ?Microbiology results: ?5/12 BCx: pending ? ? ?Thank you for allowing pharmacy to be a part of this patient?s care. ? ?7/12, PharmD ?[id number] Pharmacy Resident ?02/15/2022 10:28 PM  ? ?Please check AMION for all [facility] Pharmacy phone numbers ?After 10:00 PM, call Main Pharmacy [phone] ? ? ?

## 2022-02-15 NOTE — ED Triage Notes (Signed)
BIB [facility] from home. Fell off couch around 1600 refused to come to ED until now. Altered, difficulty speaking and ambulating. Many co-morbidities including diabetic. Hx left BKA. LNW 1600. ?

## 2022-02-15 NOTE — Consult Note (Addendum)
NEUROLOGY CONSULTATION NOTE   Date of service: Feb 15, 2022 Patient Name: Austin [name] MRN:  [id number] DOB:  November 30, 1955 Reason for consult: "Stroke code for concern for encephalopathy and L sided weakness" Requesting Provider: No att. providers found _ _ _   _ __   _ __ _ _  __ __   _ __   __ _  History of Present Illness  Austin [name] is a 66 y.o. male with PMH significant for DM2, Afibb on Eliquis and took it this AM, hx of GERD, HTN, HLD, peripheral vasc disease and s/p left BKA, CAD who was brought in by EMS with encephalopathy and concern for L sided weakness. Per EMS, family told then that he has not been feeling well for the last day and has diarrhea. Today around 1400, he slid out from the recliner and was on the floor. This is apparently normal for him. Family requested that he be seen in the hospital but patient declined to go to hospital. Around 1600, family found him confused and not very responsive so they eventually called EMS. EMS noted him to be very confused, not following commands for them in LUE and was hypotensive. He was given fluid bolus and activated and brought in as a code stroke. EMS report significant improvement after he was given the fluid bolus.  On arrival, he is drowsy, exam is thereforelimited. He is significantly bradyphrenic but difficult to evaluate for aphasia out of proportion to encephalopathy. His R leg appears weaker than his L leg with R get externally rotated. Per patient, this is normal and he has difficulty and pain with his R leg but given drowsiness, unclear if reliable historian.  LKW: 1600 on 02/15/22. mRS: 4 tNKASE: not offered, he is on eliquis and symptoms seem Thrombectomy: not offered, no LVO NIHSS components Score: Comment  1a Level of Conscious 0[]  1[x]  2[]  3[]      1b LOC Questions 0[x]  1[]  2[]       1c LOC Commands 0[x]  1[]  2[]       2 Best Gaze 0[x]  1[]  2[]       3 Visual 0[x]  1[]  2[]  3[]      4 Facial Palsy 0[x]  1[]  2[]  3[]       5a Motor Arm - left 0[x]  1[]  2[]  3[]  4[]  UN[]    5b Motor Arm - Right 0[x]  1[]  2[]  3[]  4[]  UN[]    6a Motor Leg - Left 0[]  1[x]  2[]  3[]  4[]  UN[]    6b Motor Leg - Right 0[]  1[]  2[]  3[x]  4[]  UN[]    7 Limb Ataxia 0[x]  1[]  2[]  3[]  UN[]     8 Sensory 0[x]  1[]  2[]  UN[]      9 Best Language 0[]  1[x]  2[]  3[]      10 Dysarthria 0[x]  1[]  2[]  UN[]      11 Extinct. and Inattention 0[x]  1[]  2[]       TOTAL: 6      ROS   Unable to get detailed ROS 2/2 drowsiness and bradyphrenia. Keeps falling asleep during eval.  Past History   Past Medical History:  Diagnosis Date  . Coronary artery disease   . Diabetes (HCC)   . DVT (deep venous thrombosis) (HCC)   . GERD (gastroesophageal reflux disease)   . Hyperlipidemia   . Hypertension   . Peripheral vascular disease [facility])    Past Surgical History:  Procedure Laterality Date  . AMPUTATION Left 10/28/2018   Procedure: AMPUTATION BELOW KNEE;  Surgeon: Annice Needy, MD;  Location: [facility];  Service:  Vascular;  Laterality: Left;  . AMPUTATION Left 09/10/2021   Procedure: AMPUTATION DIGIT;  Surgeon: Allena Napoleon, MD;  Location: [facility];  Service: Plastics;  Laterality: Left;  . AMPUTATION Left 09/14/2021   Procedure: AMPUTATION DIGIT left long finger and irrigation and debridement;  Surgeon: Allena Napoleon, MD;  Location: [facility];  Service: Plastics;  Laterality: Left;  . AMPUTATION TOE Left 02/10/2018   Procedure: AMPUTATION TOE;  Surgeon: Linus Galas, DPM;  Location: [facility];  Service: Podiatry;  Laterality: Left;  . APPLICATION OF WOUND VAC Left 10/16/2018   Procedure: APPLICATION OF WOUND VAC;  Surgeon: Gwyneth Revels, DPM;  Location: [facility];  Service: Podiatry;  Laterality: Left;  . DORSAL SLIT N/A 01/12/2021   Procedure: DORSAL SLIT;  Surgeon: Sondra Come, MD;  Location: [facility];  Service: Urology;  Laterality: N/A;  . groin surgery    . IRRIGATION AND DEBRIDEMENT FOOT Left 09/30/2018   Procedure: IRRIGATION AND DEBRIDEMENT FOOT;  Surgeon:  Linus Galas, DPM;  Location: [facility];  Service: Podiatry;  Laterality: Left;  . LOWER EXTREMITY ANGIOGRAPHY Left 02/12/2018   Procedure: Lower Extremity Angiography;  Surgeon: Annice Needy, MD;  Location: [facility];  Service: Cardiovascular;  Laterality: Left;  . LOWER EXTREMITY ANGIOGRAPHY Left 10/01/2018   Procedure: Lower Extremity Angiography;  Surgeon: Annice Needy, MD;  Location: [facility];  Service: Cardiovascular;  Laterality: Left;  . LOWER EXTREMITY ANGIOGRAPHY Left 10/19/2018   Procedure: Lower Extremity Angiography;  Surgeon: Annice Needy, MD;  Location: [facility];  Service: Cardiovascular;  Laterality: Left;  . LOWER EXTREMITY ANGIOGRAPHY Left 10/20/2018   Procedure: LOWER EXTREMITY ANGIOGRAPHY;  Surgeon: Annice Needy, MD;  Location: [facility];  Service: Cardiovascular;  Laterality: Left;  . LOWER EXTREMITY ANGIOGRAPHY Right 11/25/2018   Procedure: LOWER EXTREMITY ANGIOGRAPHY;  Surgeon: Annice Needy, MD;  Location: [facility];  Service: Cardiovascular;  Laterality: Right;  . LOWER EXTREMITY ANGIOGRAPHY Right 01/10/2020   Procedure: LOWER EXTREMITY ANGIOGRAPHY;  Surgeon: Annice Needy, MD;  Location: [facility];  Service: Cardiovascular;  Laterality: Right;  . LOWER EXTREMITY ANGIOGRAPHY Right 12/11/2020   Procedure: LOWER EXTREMITY ANGIOGRAPHY;  Surgeon: Annice Needy, MD;  Location: [facility];  Service: Cardiovascular;  Laterality: Right;  . LOWER EXTREMITY INTERVENTION  02/12/2018   Procedure: LOWER EXTREMITY INTERVENTION;  Surgeon: Annice Needy, MD;  Location: [facility];  Service: Cardiovascular;;  . MINOR IRRIGATION AND DEBRIDEMENT OF WOUND Left 09/10/2021   Procedure: IRRIGATION AND DEBRIDEMENT OF LEFT LONG FINGER WOUND;  Surgeon: Allena Napoleon, MD;  Location: [facility];  Service: Plastics;  Laterality: Left;  . NECK SURGERY    . TRANSMETATARSAL AMPUTATION Left 10/16/2018   Procedure: TRANSMETATARSAL  AMPUTATION LEFT FOOT;  Surgeon: Gwyneth Revels, DPM;  Location: [facility];  Service: Podiatry;  Laterality: Left;   Family History  Problem Relation Age of Onset  . Diabetes Mother   . Coronary artery disease Father   . Hypertension Sister   . Leukemia Brother    Social History   Socioeconomic History  . Marital status: Divorced    Spouse name: Not on file  . Number of children: Not on file  . Years of education: Not on file  . Highest education level: Not on file  Occupational History  . Not on file  Tobacco Use  . Smoking status: Former    Packs/day: 1.00  Years: 47.00    Pack years: 47.00    Types: Cigarettes    Quit date: 01/2021    Years since quitting: 1.1  . Smokeless tobacco: Never  Vaping Use  . Vaping Use: Never used  Substance and Sexual Activity  . Alcohol use: No  . Drug use: No  . Sexual activity: Not Currently  Other Topics Concern  . Not on file  Social History Narrative  . Not on file   Social Determinants of Health   Financial Resource Strain: Not on file  Food Insecurity: Not on file  Transportation Needs: Not on file  Physical Activity: Not on file  Stress: Not on file  Social Connections: Not on file   Not on File  Medications  (Not in a hospital admission)    Vitals   There were no vitals filed for this visit.   There is no height or weight on file to calculate BMI.  Physical Exam   General: Laying comfortably in bed; in no acute distress.  HENT: Normal oropharynx and mucosa. Normal external appearance of ears and nose.  Neck: Supple, no pain or tenderness  CV: No JVD. No peripheral edema.  Pulmonary: Symmetric Chest rise. Normal respiratory effort.  Abdomen: Soft to touch, non-tender.  Ext: No cyanosis, edema, or deformity  Skin: No rash. Normal palpation of skin.   Musculoskeletal: Normal digits and nails by inspection. No clubbing.   Neurologic Examination  Mental status/Cognition: drowsy, requires verbal and tactile  stimulation to be able to stay awake and get some history, oriented to self, place, month and year. Poor attention Speech/language: non fluent but secondary to drowsiness i presume, he is able to identify objects, requires coaxing to follow 1 step commands. Cranial nerves:   CN II Pupils equal and reactive to light, difficult to assess for VF deficit but blinks BL   CN III,IV,VI EOM intact, no gaze preference or deviation, no nystagmus    CN V normal sensation in V1, V2, and V3 segments bilaterally    CN VII no asymmetry, no nasolabial fold flattening    CN VIII normal hearing to speech    CN IX & X normal palatal elevation, no uvular deviation    CN XI 5/5 head turn and 5/5 shoulder shrug bilaterally    CN XII midline tongue protrusion    Motor:  Muscle bulk: normal, tone normal, pronator drift none Difficult to do detailed strength testing secondary to somnolence but spontaneously moving all extremities. Will hold BL upper extremities off the bed without drift for more than 10 secs. Will left L eg off the bed and hold up for more than 5 secs with some drift. Unable to lift R leg off the bed, reports pain in R hip.  Sensation:  Light touch Intact throughout   Pin prick    Temperature    Vibration   Proprioception    Coordination/Complex Motor:  - Finger to Nose intact BL - Heel to shin unable to do. - Rapid alternating movement are slowed - Gait: Deferred for patient safety.  Labs   CBC: No results for input(s): WBC, NEUTROABS, HGB, HCT, MCV, PLT in the last 168 hours.  Basic Metabolic Panel:  Lab Results  Component Value Date   NA 134 (L) 09/19/2021   K 4.3 09/19/2021   CO2 24 09/19/2021   GLUCOSE 131 (H) 09/19/2021   BUN 11 09/19/2021   CREATININE 0.68 09/19/2021   CALCIUM 8.8 (L) 09/19/2021   GFRNONAA >60 09/19/2021  GFRAA >60 01/10/2020   Lipid Panel:  Lab Results  Component Value Date   LDLCALC 106 (H) 11/14/2013   HgbA1c:  Lab Results  Component Value Date    HGBA1C 12.8 (H) 09/07/2021   Urine Drug Screen: No results found for: LABOPIA, COCAINSCRNUR, LABBENZ, AMPHETMU, THCU, LABBARB  Alcohol Level No results found for: ETH  CT Head without contrast(Personally reviewed): CTH was negative for a large hypodensity concerning for a large territory infarct or hyperdensity concerning for an ICH  CT angio Head and Neck with contrast(Personally reviewed): No LVO.  MRI Brain: pending  Impression   Austin [name] is a 66 y.o. male with PMH significant for with PMH significant for DM2, Afibb on Eliquis and took it this AM, hx of GERD, HTN, HLD, peripheral vasc disease and s/p left BKA, CAD who was brought in by EMS with encephalopathy and concern for L sided weakness. Exam is difficult secondary to encephalopathy but some concern for weakness in RLE worse than LLE. Unclear chronicity, does seem to be related to pain but his RLE is externally rotated.  He is not a candidate for tNKASE 2/2 eliquis and outside the window and not a candidate for thrombectomy 2/2 no LVO.  Recommendations  - Recommend MRI Brain without contrast. If negative for stroke, no further stroke workup. - Recommend encephalopathy workup with CBC, chemistry, vitals, infectious screen with UA, CXR, procalcitonin and lactate. Ammonia, TSH, Vit B12 and folate. ______________________________________________________________________   Thank you for the opportunity to take part in the care of this patient. If you have any further questions, please contact the neurology consultation attending.  Signed,  Erick Blinks [facility] Pager Number [phone] _ _ _   _ __   _ __ _ _  __ __   _ __   __ _

## 2022-02-15 NOTE — ED Provider Notes (Signed)
?[facility] ?Provider Note ? ? ?CSN: [id number] ?Arrival date & time: 02/15/22  2044 ? ?  ? ?History ? ?No chief complaint on file. ? ? ?MARVIN GRABILL is a 66 y.o. male. ? ?HPI ?Patient arrived as a code stroke and significant history obtained from EMS by stroke team.  Patient is a somewhat limited historian but at the time of my assessment which was after completing imaging, patient is responding to questions albeit seeming mildly confused.  At the time of my evaluation, patient's main focus and complaint is severe pain in his right foot.  The below history was obtained by Dr. Sarita Haver. ? ?"BOBBY BARTON is a 66 y.o. male with PMH significant for DM2, Afibb on Eliquis and took it this AM, hx of GERD, HTN, HLD, peripheral vasc disease and s/p Right BKA, CAD who was brought in by EMS with encephalopathy and concern for L sided weakness. Per EMS, family told then that he has not been feeling well for the last day and has diarrhea. Today around 1400, he slid out from the recliner and was on the floor. This is apparently normal for him. Family requested that he be seen in the hospital but patient declined to go to hospital. Around 1600, family found him confused and not very responsive so they eventually called EMS. EMS noted him to be very confused, not following commands for them in LUE and was hypotensive. He was given 523ml fluid bolus and activated and brought in as a code stroke. EMS report significant improvement after he was given the fluid bolus." ?  ? ?Home Medications ?Prior to Admission medications   ?Medication Sig Start Date End Date Taking? Authorizing Provider  ?ACCU-CHEK GUIDE test strip  06/26/20   [provider]  ?apixaban (ELIQUIS) 5 MG TABS tablet Take 1 tablet (5 mg total) by mouth 2 (two) times daily. 02/12/18   Nicholes Mango, MD  ?aspirin EC 81 MG tablet Take 1 tablet (81 mg total) by mouth daily. 01/10/20   Algernon Huxley, MD  ?atorvastatin  (LIPITOR) 80 MG tablet Take 1 tablet (80 mg total) by mouth daily at 6 PM. 02/12/18   Gouru, Aruna, MD  ?Blood Glucose Monitoring Suppl (ACCU-CHEK GUIDE) w/Device KIT USE 1 KIT AS DIRECTED FOUR TIMES A DAY 10/27/20   [provider]  ?Docusate Sodium (DSS) 100 MG CAPS Take 100 mg by mouth in the morning and at bedtime.    [provider]  ?HUMALOG KWIKPEN 100 UNIT/ML KwikPen Inject 5 Units into the skin 3 (three) times daily before meals. 09/19/21   Dessa Phi, DO  ?insulin detemir (LEVEMIR) 100 UNIT/ML injection Inject 0.44 mLs (44 Units total) into the skin at bedtime. 09/19/21   Dessa Phi, DO  ?losartan (COZAAR) 25 MG tablet Take 25 mg by mouth daily. 11/13/20   [provider]  ?TRUEPLUS 5-BEVEL PEN NEEDLES 31G X 6 MM [place]  10/25/20   [provider]  ?   ? ?Allergies    ?Patient has no allergy information on record.   ? ?Review of Systems   ?Review of Systems ?5 caveat cannot obtain review of systems due to patient confusion. ?Physical Exam ?Updated Vital Signs ?There were no vitals taken for this visit. ?Physical Exam ?HENT:  ?   Mouth/Throat:  ?   Pharynx: Oropharynx is clear.  ?Cardiovascular:  ?   Rate and Rhythm: Normal rate and regular rhythm.  ?Pulmonary:  ?   Effort: Pulmonary effort  is normal.  ?   Breath sounds: Normal breath sounds.  ?Abdominal:  ?   General: There is no distension.  ?   Palpations: Abdomen is soft.  ?   Tenderness: There is no abdominal tenderness. There is no guarding.  ?Musculoskeletal:  ?   Comments: Large eroded ulcer to the sole of the right foot.  Erythema of the foot and lower leg.  ?Skin: ?   General: Skin is warm and dry.  ?Neurological:  ?   General: No focal deficit present.  ?   Mental Status: He is alert.  ? ? ?ED Results / Procedures / Treatments   ?Labs ?(all labs ordered are listed, but only abnormal results are displayed) ?Labs Reviewed  ?I-STAT CHEM 8, ED - Abnormal; Notable for the following components:  ?    Result Value  ?  Glucose, Bld 296 (*)   ? Calcium, Ion 1.10 (*)   ? All other components within normal limits  ?CBG MONITORING, ED - Abnormal; Notable for the following components:  ? Glucose-Capillary 265 (*)   ? All other components within normal limits  ?PROTIME-INR  ?APTT  ?CBC  ?DIFFERENTIAL  ?COMPREHENSIVE METABOLIC PANEL  ? ? ?EKG ?EKG Interpretation ? ?Date/Time:  Saturday Feb 16 2022 03:45:06 EDT ?Ventricular Rate:  77 ?PR Interval:    ?QRS Duration: 94 ?QT Interval:  364 ?QTC Calculation: 412 ?R Axis:   -31 ?Text Interpretation: Incomplete analysis due to missing data in precordial lead(s) Atrial fibrillation Left anterior fascicular block Borderline low voltage, extremity leads Missing lead(s): V2 Confirmed by Orpah Greek [phone]) on 02/17/2022 7:16:21 PM ? ?Radiology ?No results found. ? ?Procedures ?Procedures  ? ?CRITICAL CARE ?Performed by: Charlesetta Shanks ? ? ?Total critical care time: 30 minutes ? ?Critical care time was exclusive of separately billable procedures and treating other patients. ? ?Critical care was necessary to treat or prevent imminent or life-threatening deterioration. ? ?Critical care was time spent personally by me on the following activities: development of treatment plan with patient and/or surrogate as well as nursing, discussions with consultants, evaluation of patient's response to treatment, examination of patient, obtaining history from patient or surrogate, ordering and performing treatments and interventions, ordering and review of laboratory studies, ordering and review of radiographic studies, pulse oximetry and re-evaluation of patient's condition.  ?Medications Ordered in ED ?Medications  ?sodium chloride flush (NS) 0.9 % injection 3 mL (has no administration in time range)  ?iohexol (OMNIPAQUE) 350 MG/ML injection 75 mL (75 mLs Intravenous Contrast Given 02/15/22 2104)  ? ? ?ED Course/ Medical Decision Making/ A&P ?  ?                        ?Medical Decision Making ?Amount  and/or Complexity of Data Reviewed ?Labs: ordered. ?Radiology: ordered. ? ?Risk ?Prescription drug management. ?Decision regarding hospitalization. ? ?Patient arrives as code stroke.  After completing diagnostic evaluation no stroke findings.  Patient is febrile and has altered mental status more consistent with acute infectious etiology.  Physical exam shows a large ulcer and redness of the right lower extremity.  Sepsis protocol initiated. ? ?consult: [facility] hospitalist Dr. Bridgett Larsson for admission. ? ?Diagnostic results reviewed.  Patient has normal renal function but significantly increased white count supporting sepsis as etiology for mental status change conjunction with fever.  Etiology interpretation reviewed of studies including chest x-ray which does not show evidence of pneumonia.  Also obtained were imaging of the foot for possible osteomyelitis with  an ulcer and swelling.  X-rays do not indicate osteomyelitis.  Sepsis protocol initiated however patient is not requiring respiratory support or pressors.  Stable for admission to hospitalist service. ? ? ? ? ? ? ? 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 ? ?Rx / DC Orders ?ED Discharge Orders   ? ? None  ? ?  ? ? ?  ?Charlesetta Shanks, MD ?02/20/22 1115 ? ?

## 2022-02-15 NOTE — Progress Notes (Addendum)
Responded to Code Stroke called at 2027 for L sided weakness, LSN-1600. Pt arrived at 2044, CBG-265, NIH-6, CT head negative, CTA-no LVO. TNK not given-pt on eliquis and outside window. Plan MRI and metabolic work-up.  ?

## 2022-02-16 ENCOUNTER — Encounter (HOSPITAL_COMMUNITY): Payer: Self-pay | Admitting: Internal Medicine

## 2022-02-16 ENCOUNTER — Inpatient Hospital Stay (HOSPITAL_COMMUNITY): Payer: Medicare Other

## 2022-02-16 DIAGNOSIS — E669 Obesity, unspecified: Secondary | ICD-10-CM | POA: Diagnosis not present

## 2022-02-16 DIAGNOSIS — E1151 Type 2 diabetes mellitus with diabetic peripheral angiopathy without gangrene: Secondary | ICD-10-CM | POA: Diagnosis present

## 2022-02-16 DIAGNOSIS — E785 Hyperlipidemia, unspecified: Secondary | ICD-10-CM | POA: Diagnosis present

## 2022-02-16 DIAGNOSIS — M869 Osteomyelitis, unspecified: Secondary | ICD-10-CM | POA: Diagnosis present

## 2022-02-16 DIAGNOSIS — A4101 Sepsis due to Methicillin susceptible Staphylococcus aureus: Secondary | ICD-10-CM | POA: Diagnosis present

## 2022-02-16 DIAGNOSIS — Z794 Long term (current) use of insulin: Secondary | ICD-10-CM

## 2022-02-16 DIAGNOSIS — E11621 Type 2 diabetes mellitus with foot ulcer: Secondary | ICD-10-CM | POA: Diagnosis present

## 2022-02-16 DIAGNOSIS — I70244 Atherosclerosis of native arteries of left leg with ulceration of heel and midfoot: Secondary | ICD-10-CM | POA: Diagnosis not present

## 2022-02-16 DIAGNOSIS — E1169 Type 2 diabetes mellitus with other specified complication: Secondary | ICD-10-CM | POA: Diagnosis present

## 2022-02-16 DIAGNOSIS — L97513 Non-pressure chronic ulcer of other part of right foot with necrosis of muscle: Secondary | ICD-10-CM

## 2022-02-16 DIAGNOSIS — I482 Chronic atrial fibrillation, unspecified: Secondary | ICD-10-CM | POA: Diagnosis present

## 2022-02-16 DIAGNOSIS — Z79899 Other long term (current) drug therapy: Secondary | ICD-10-CM | POA: Diagnosis not present

## 2022-02-16 DIAGNOSIS — A419 Sepsis, unspecified organism: Secondary | ICD-10-CM

## 2022-02-16 DIAGNOSIS — G9341 Metabolic encephalopathy: Secondary | ICD-10-CM | POA: Diagnosis present

## 2022-02-16 DIAGNOSIS — R4182 Altered mental status, unspecified: Secondary | ICD-10-CM | POA: Diagnosis not present

## 2022-02-16 DIAGNOSIS — Z1321 Encounter for screening for nutritional disorder: Secondary | ICD-10-CM | POA: Diagnosis not present

## 2022-02-16 DIAGNOSIS — L97401 Non-pressure chronic ulcer of unspecified heel and midfoot limited to breakdown of skin: Secondary | ICD-10-CM | POA: Diagnosis not present

## 2022-02-16 DIAGNOSIS — I10 Essential (primary) hypertension: Secondary | ICD-10-CM

## 2022-02-16 DIAGNOSIS — E11622 Type 2 diabetes mellitus with other skin ulcer: Secondary | ICD-10-CM | POA: Diagnosis present

## 2022-02-16 DIAGNOSIS — Z806 Family history of leukemia: Secondary | ICD-10-CM | POA: Diagnosis not present

## 2022-02-16 DIAGNOSIS — Z89431 Acquired absence of right foot: Secondary | ICD-10-CM | POA: Diagnosis not present

## 2022-02-16 DIAGNOSIS — R6520 Severe sepsis without septic shock: Secondary | ICD-10-CM

## 2022-02-16 DIAGNOSIS — D62 Acute posthemorrhagic anemia: Secondary | ICD-10-CM | POA: Diagnosis present

## 2022-02-16 DIAGNOSIS — E872 Acidosis, unspecified: Secondary | ICD-10-CM | POA: Diagnosis present

## 2022-02-16 DIAGNOSIS — Z87891 Personal history of nicotine dependence: Secondary | ICD-10-CM | POA: Diagnosis not present

## 2022-02-16 DIAGNOSIS — Z89512 Acquired absence of left leg below knee: Secondary | ICD-10-CM | POA: Diagnosis not present

## 2022-02-16 DIAGNOSIS — Z807 Family history of other malignant neoplasms of lymphoid, hematopoietic and related tissues: Secondary | ICD-10-CM | POA: Diagnosis not present

## 2022-02-16 DIAGNOSIS — A401 Sepsis due to streptococcus, group B: Secondary | ICD-10-CM | POA: Diagnosis present

## 2022-02-16 DIAGNOSIS — M79671 Pain in right foot: Secondary | ICD-10-CM | POA: Diagnosis not present

## 2022-02-16 DIAGNOSIS — Z8249 Family history of ischemic heart disease and other diseases of the circulatory system: Secondary | ICD-10-CM | POA: Diagnosis not present

## 2022-02-16 DIAGNOSIS — M86671 Other chronic osteomyelitis, right ankle and foot: Secondary | ICD-10-CM | POA: Diagnosis not present

## 2022-02-16 DIAGNOSIS — I4891 Unspecified atrial fibrillation: Secondary | ICD-10-CM | POA: Diagnosis not present

## 2022-02-16 DIAGNOSIS — L97829 Non-pressure chronic ulcer of other part of left lower leg with unspecified severity: Secondary | ICD-10-CM | POA: Diagnosis present

## 2022-02-16 DIAGNOSIS — L97529 Non-pressure chronic ulcer of other part of left foot with unspecified severity: Secondary | ICD-10-CM | POA: Diagnosis not present

## 2022-02-16 DIAGNOSIS — I70221 Atherosclerosis of native arteries of extremities with rest pain, right leg: Secondary | ICD-10-CM | POA: Diagnosis present

## 2022-02-16 DIAGNOSIS — L97516 Non-pressure chronic ulcer of other part of right foot with bone involvement without evidence of necrosis: Secondary | ICD-10-CM | POA: Diagnosis present

## 2022-02-16 DIAGNOSIS — Z01810 Encounter for preprocedural cardiovascular examination: Secondary | ICD-10-CM | POA: Diagnosis not present

## 2022-02-16 DIAGNOSIS — L97509 Non-pressure chronic ulcer of other part of unspecified foot with unspecified severity: Secondary | ICD-10-CM | POA: Diagnosis not present

## 2022-02-16 DIAGNOSIS — Z7982 Long term (current) use of aspirin: Secondary | ICD-10-CM | POA: Diagnosis not present

## 2022-02-16 DIAGNOSIS — E13621 Other specified diabetes mellitus with foot ulcer: Secondary | ICD-10-CM | POA: Diagnosis present

## 2022-02-16 DIAGNOSIS — E1110 Type 2 diabetes mellitus with ketoacidosis without coma: Secondary | ICD-10-CM | POA: Diagnosis not present

## 2022-02-16 DIAGNOSIS — M86171 Other acute osteomyelitis, right ankle and foot: Secondary | ICD-10-CM | POA: Diagnosis not present

## 2022-02-16 DIAGNOSIS — E1165 Type 2 diabetes mellitus with hyperglycemia: Secondary | ICD-10-CM | POA: Diagnosis present

## 2022-02-16 DIAGNOSIS — K219 Gastro-esophageal reflux disease without esophagitis: Secondary | ICD-10-CM | POA: Diagnosis present

## 2022-02-16 DIAGNOSIS — Z7902 Long term (current) use of antithrombotics/antiplatelets: Secondary | ICD-10-CM | POA: Diagnosis not present

## 2022-02-16 DIAGNOSIS — T82858A Stenosis of vascular prosthetic devices, implants and grafts, initial encounter: Secondary | ICD-10-CM | POA: Diagnosis present

## 2022-02-16 DIAGNOSIS — Z4781 Encounter for orthopedic aftercare following surgical amputation: Secondary | ICD-10-CM | POA: Diagnosis present

## 2022-02-16 DIAGNOSIS — E559 Vitamin D deficiency, unspecified: Secondary | ICD-10-CM | POA: Diagnosis present

## 2022-02-16 DIAGNOSIS — E1142 Type 2 diabetes mellitus with diabetic polyneuropathy: Secondary | ICD-10-CM | POA: Diagnosis present

## 2022-02-16 DIAGNOSIS — Z7901 Long term (current) use of anticoagulants: Secondary | ICD-10-CM | POA: Diagnosis not present

## 2022-02-16 DIAGNOSIS — I2510 Atherosclerotic heart disease of native coronary artery without angina pectoris: Secondary | ICD-10-CM | POA: Diagnosis present

## 2022-02-16 DIAGNOSIS — L03115 Cellulitis of right lower limb: Secondary | ICD-10-CM | POA: Diagnosis not present

## 2022-02-16 DIAGNOSIS — I739 Peripheral vascular disease, unspecified: Secondary | ICD-10-CM | POA: Diagnosis not present

## 2022-02-16 DIAGNOSIS — Z833 Family history of diabetes mellitus: Secondary | ICD-10-CM | POA: Diagnosis not present

## 2022-02-16 DIAGNOSIS — M62838 Other muscle spasm: Secondary | ICD-10-CM | POA: Diagnosis not present

## 2022-02-16 DIAGNOSIS — Y831 Surgical operation with implant of artificial internal device as the cause of abnormal reaction of the patient, or of later complication, without mention of misadventure at the time of the procedure: Secondary | ICD-10-CM | POA: Diagnosis present

## 2022-02-16 DIAGNOSIS — Z86718 Personal history of other venous thrombosis and embolism: Secondary | ICD-10-CM | POA: Diagnosis not present

## 2022-02-16 LAB — COMPREHENSIVE METABOLIC PANEL
ALT: 16 U/L (ref 0–44)
AST: 16 U/L (ref 15–41)
Albumin: 2.7 g/dL — ABNORMAL LOW (ref 3.5–5.0)
Alkaline Phosphatase: 64 U/L (ref 38–126)
Anion gap: 5 (ref 5–15)
BUN: 10 mg/dL (ref 8–23)
CO2: 27 mmol/L (ref 22–32)
Calcium: 8.8 mg/dL — ABNORMAL LOW (ref 8.9–10.3)
Chloride: 104 mmol/L (ref 98–111)
Creatinine, Ser: 1.04 mg/dL (ref 0.61–1.24)
GFR, Estimated: >60 mL/min (ref >60)
Glucose, Bld: 243 mg/dL — ABNORMAL HIGH (ref 70–99)
Potassium: 3.8 mmol/L (ref 3.5–5.1)
Sodium: 136 mmol/L (ref 135–145)
Total Bilirubin: 1.2 mg/dL (ref 0.3–1.2)
Total Protein: 6.9 g/dL (ref 6.5–8.1)

## 2022-02-16 LAB — VITAMIN B12: Vitamin B-12: 746 pg/mL (ref 180–914)

## 2022-02-16 LAB — CBG MONITORING, ED
Glucose-Capillary: 218 mg/dL — ABNORMAL HIGH (ref 70–99)
Glucose-Capillary: 272 mg/dL — ABNORMAL HIGH (ref 70–99)
Glucose-Capillary: 238 mg/dL — ABNORMAL HIGH (ref 70–99)
Glucose-Capillary: 215 mg/dL — ABNORMAL HIGH (ref 70–99)

## 2022-02-16 LAB — CBC WITH DIFFERENTIAL/PLATELET
Abs Immature Granulocytes: 0.06 10*3/uL (ref 0.00–0.07)
Basophils Absolute: 0 10*3/uL (ref 0.0–0.1)
Basophils Relative: 0 %
Eosinophils Absolute: 0 10*3/uL (ref 0.0–0.5)
Eosinophils Relative: 0 %
HCT: 43.5 % (ref 39.0–52.0)
Hemoglobin: 14.5 g/dL (ref 13.0–17.0)
Immature Granulocytes: 0 %
Lymphocytes Relative: 16 %
Lymphs Abs: 2.2 10*3/uL (ref 0.7–4.0)
MCH: 27.9 pg (ref 26.0–34.0)
MCHC: 33.3 g/dL (ref 30.0–36.0)
MCV: 83.7 fL (ref 80.0–100.0)
Monocytes Absolute: 1.3 10*3/uL — ABNORMAL HIGH (ref 0.1–1.0)
Monocytes Relative: 9 %
Neutro Abs: 10.1 10*3/uL — ABNORMAL HIGH (ref 1.7–7.7)
Neutrophils Relative %: 75 %
Platelets: 167 10*3/uL (ref 150–400)
RBC: 5.2 MIL/uL (ref 4.22–5.81)
RDW: 15 % (ref 11.5–15.5)
WBC: 13.6 10*3/uL — ABNORMAL HIGH (ref 4.0–10.5)
nRBC: 0 % (ref 0.0–0.2)

## 2022-02-16 LAB — MAGNESIUM: Magnesium: 1.5 mg/dL — ABNORMAL LOW (ref 1.7–2.4)

## 2022-02-16 LAB — PROCALCITONIN: Procalcitonin: 0.34 ng/mL

## 2022-02-16 LAB — TSH: TSH: 0.831 u[IU]/mL (ref 0.350–4.500)

## 2022-02-16 LAB — LACTIC ACID, PLASMA: Lactic Acid, Venous: 2.6 mmol/L (ref 0.5–1.9)

## 2022-02-16 LAB — AMMONIA: Ammonia: 13 umol/L (ref 9–35)

## 2022-02-16 MED ORDER — OXYCODONE HCL 5 MG PO TABS
5.0000 mg | ORAL_TABLET | ORAL | Status: DC | PRN
  Administered 2022-02-17 – 2022-03-05 (×9): 5 mg via ORAL
  Filled 2022-02-16 (×9): qty 1

## 2022-02-16 MED ORDER — SODIUM CHLORIDE 0.9 % IV SOLN: 2.0000 g | INTRAVENOUS | Status: DC

## 2022-02-16 MED ORDER — ACETAMINOPHEN 650 MG RE SUPP: 650.0000 mg | Freq: Four times a day (QID) | RECTAL | Status: DC | PRN

## 2022-02-16 MED ORDER — ASPIRIN 81 MG PO TBEC
81.0000 mg | DELAYED_RELEASE_TABLET | Freq: Every day | ORAL | Status: DC
  Administered 2022-02-16 – 2022-03-08 (×19): 81 mg via ORAL
  Filled 2022-02-16 (×19): qty 1

## 2022-02-16 MED ORDER — LOSARTAN POTASSIUM 25 MG PO TABS
25.0000 mg | ORAL_TABLET | Freq: Every day | ORAL | Status: DC
Start: 2022-02-16 — End: 2022-03-08
  Administered 2022-02-16 – 2022-03-08 (×20): 25 mg via ORAL
  Filled 2022-02-16 (×20): qty 1

## 2022-02-16 MED ORDER — ATORVASTATIN CALCIUM 80 MG PO TABS
80.0000 mg | ORAL_TABLET | Freq: Every day | ORAL | Status: DC
  Administered 2022-02-16 – 2022-03-07 (×19): 80 mg via ORAL
  Filled 2022-02-16 (×10): qty 1
  Filled 2022-02-16: qty 2
  Filled 2022-02-16 (×8): qty 1

## 2022-02-16 MED ORDER — METRONIDAZOLE 500 MG/100ML IV SOLN
500.0000 mg | Freq: Two times a day (BID) | INTRAVENOUS | Status: DC
  Administered 2022-02-16: 500 mg via INTRAVENOUS
  Filled 2022-02-16: qty 100

## 2022-02-16 MED ORDER — INSULIN ASPART 100 UNIT/ML IJ SOLN
2.0000 [IU] | Freq: Three times a day (TID) | INTRAMUSCULAR | Status: DC
  Administered 2022-02-16 – 2022-02-17 (×6): 2 [IU] via SUBCUTANEOUS

## 2022-02-16 MED ORDER — HYDRALAZINE HCL 20 MG/ML IJ SOLN
10.0000 mg | INTRAMUSCULAR | Status: DC | PRN
Start: 2022-02-16 — End: 2022-03-08

## 2022-02-16 MED ORDER — SODIUM CHLORIDE 0.9 % IV SOLN: INTRAVENOUS | Status: AC

## 2022-02-16 MED ORDER — ONDANSETRON HCL 4 MG/2ML IJ SOLN
4.0000 mg | Freq: Four times a day (QID) | INTRAMUSCULAR | Status: DC | PRN
Start: 2022-02-16 — End: 2022-03-08

## 2022-02-16 MED ORDER — MAGNESIUM SULFATE 2 GM/50ML IV SOLN
2.0000 g | Freq: Once | INTRAVENOUS | Status: AC
Start: 2022-02-16 — End: 2022-02-16
  Administered 2022-02-16: 2 g via INTRAVENOUS
  Filled 2022-02-16: qty 50

## 2022-02-16 MED ORDER — APIXABAN 5 MG PO TABS
5.0000 mg | ORAL_TABLET | Freq: Two times a day (BID) | ORAL | Status: DC
  Administered 2022-02-16 – 2022-02-17 (×3): 5 mg via ORAL
  Filled 2022-02-16 (×3): qty 1

## 2022-02-16 MED ORDER — ONDANSETRON HCL 4 MG PO TABS
4.0000 mg | ORAL_TABLET | Freq: Four times a day (QID) | ORAL | Status: DC | PRN
Start: 2022-02-16 — End: 2022-03-08

## 2022-02-16 MED ORDER — TRAZODONE HCL 50 MG PO TABS
50.0000 mg | ORAL_TABLET | Freq: Every evening | ORAL | Status: DC | PRN
Start: 2022-02-16 — End: 2022-03-08
  Administered 2022-02-23: 50 mg via ORAL
  Filled 2022-02-16: qty 1

## 2022-02-16 MED ORDER — SENNOSIDES-DOCUSATE SODIUM 8.6-50 MG PO TABS
1.0000 | ORAL_TABLET | Freq: Every evening | ORAL | Status: DC | PRN
Start: 2022-02-16 — End: 2022-03-08
  Administered 2022-02-19 – 2022-02-25 (×3): 1 via ORAL
  Filled 2022-02-16 (×3): qty 1

## 2022-02-16 MED ORDER — SODIUM CHLORIDE 0.9 % IV SOLN
2.0000 g | Freq: Three times a day (TID) | INTRAVENOUS | Status: DC
  Administered 2022-02-16 – 2022-02-19 (×12): 2 g via INTRAVENOUS
  Filled 2022-02-16 (×13): qty 12.5

## 2022-02-16 MED ORDER — ACETAMINOPHEN 325 MG PO TABS: 650.0000 mg | ORAL_TABLET | Freq: Four times a day (QID) | ORAL | Status: DC | PRN

## 2022-02-16 MED ORDER — METOPROLOL TARTRATE 5 MG/5ML IV SOLN: 5.0000 mg | INTRAVENOUS | Status: DC | PRN

## 2022-02-16 MED ORDER — GUAIFENESIN 100 MG/5ML PO LIQD
5.0000 mL | ORAL | Status: DC | PRN
Start: 2022-02-16 — End: 2022-03-08
  Filled 2022-02-16: qty 5

## 2022-02-16 MED ORDER — INSULIN ASPART 100 UNIT/ML IJ SOLN
0.0000 [IU] | Freq: Three times a day (TID) | INTRAMUSCULAR | Status: DC
  Administered 2022-02-16: 5 [IU] via SUBCUTANEOUS
  Administered 2022-02-16: 8 [IU] via SUBCUTANEOUS
  Administered 2022-02-16 – 2022-02-17 (×2): 5 [IU] via SUBCUTANEOUS
  Administered 2022-02-17 – 2022-02-18 (×3): 8 [IU] via SUBCUTANEOUS
  Administered 2022-02-18: 11 [IU] via SUBCUTANEOUS
  Administered 2022-02-18: 15 [IU] via SUBCUTANEOUS

## 2022-02-16 MED ORDER — LACTATED RINGERS IV SOLN: INTRAVENOUS | Status: DC

## 2022-02-16 MED ORDER — IPRATROPIUM-ALBUTEROL 0.5-2.5 (3) MG/3ML IN SOLN: 3.0000 mL | RESPIRATORY_TRACT | Status: DC | PRN

## 2022-02-16 NOTE — Sepsis Progress Note (Signed)
Following for sepsis monitoring ?

## 2022-02-16 NOTE — H&P (Addendum)
?History and Physical  ? ? ?HENCE DERRICK [id number] DOB: Apr 21, 1956 DOA: 02/15/2022 ? ?DOS: the patient was seen and examined on 02/15/2022 ? ?PCP: Theotis Burrow, MD  ? ?Patient coming from: Home ? ?I have personally briefly reviewed patient's old medical records in [place] ? ?CC: altered mental status ?HPI: ?66 year old African-American male history of type 2 diabetes, hypertension, peripheral vascular disease, A-fib on chronic anticoagulation, hypertension, status post left BKA, presents to the ER from home.  Patient reportedly fell off a couch.  EMS was called but he refused to go with EMS.  Patient fell again.  This time he had altered mental status.  Reportedly had left-sided weakness. ? ?Patient was finally brought in by EMS. ? ?Patient unable to give any history due to encephalopathy.  Reportedly he had been feeling unwell.  Has had diarrhea for a day. ? ?Per EMS documentation, he was hypotensive.  He was given 5 cc bolus. ? ?Code stroke was called but canceled due to patient being on Eliquis and him being outside the [facility] window. ? ?While in ER, he spiked a fever 103.1. ? ?Lactic acid is elevated 2.0.  White count was elevated 16.9. ? ?[facility] hospitalist contacted for admission.  ? ?ED Course: febrile to 103 in ER. CT head negative. ? ?Review of Systems:  ?Review of Systems  ?Unable to perform ROS: Mental status change  ? ?Past Medical History:  ?Diagnosis Date  ? Coronary artery disease   ? Diabetes ([place])   ? DVT (deep venous thrombosis) ([place])   ? Gangrene of left foot ([place]) 02/09/2018  ? GERD (gastroesophageal reflux disease)   ? Hyperlipidemia   ? Hypertension   ? Peripheral vascular disease ([place])   ? ? ?Past Surgical History:  ?Procedure Laterality Date  ? AMPUTATION Left 10/28/2018  ? Procedure: AMPUTATION BELOW KNEE;  Surgeon: Algernon Huxley, MD;  Location: [facility];  Service: Vascular;  Laterality: Left;  ? AMPUTATION Left 09/10/2021  ? Procedure: AMPUTATION DIGIT;  Surgeon: Cindra Presume, MD;  [facility]: [facility];  Service: Plastics;  Laterality: Left;  ? AMPUTATION Left 09/14/2021  ? Procedure: AMPUTATION DIGIT left long finger and irrigation and debridement;  Surgeon: Cindra Presume, MD;  [facility];  Service: Plastics;  Laterality: Left;  ? AMPUTATION TOE Left 02/10/2018  ? Procedure: AMPUTATION TOE;  Surgeon: Sharlotte Alamo, DPM;  Location: [facility];  Service: Podiatry;  Laterality: Left;  ? APPLICATION OF WOUND VAC Left 10/16/2018  ? Procedure: APPLICATION OF WOUND VAC;  Surgeon: Samara Deist, DPM;  Location: [facility];  Service: Podiatry;  Laterality: Left;  ? DORSAL SLIT N/A 01/12/2021  ? Procedure: DORSAL SLIT;  Surgeon: Billey Co, MD;  Location: [facility];  Service: Urology;  Laterality: N/A;  ? groin surgery    ? IRRIGATION AND DEBRIDEMENT FOOT Left 09/30/2018  ? Procedure: IRRIGATION AND DEBRIDEMENT FOOT;  Surgeon: Sharlotte Alamo, DPM;  Location: [facility];  Service: Podiatry;  Laterality: Left;  ? LOWER EXTREMITY ANGIOGRAPHY Left 02/12/2018  ? Procedure: Lower Extremity Angiography;  Surgeon: Algernon Huxley, MD;  Location: [place] [facility];  Service: Cardiovascular;  Laterality: Left;  ? LOWER EXTREMITY ANGIOGRAPHY Left 10/01/2018  ? Procedure: Lower Extremity Angiography;  Surgeon: Algernon Huxley, MD;  Location: [place] [facility];  Service: Cardiovascular;  Laterality: Left;  ? LOWER EXTREMITY ANGIOGRAPHY Left 10/19/2018  ? Procedure: Lower Extremity Angiography;  Surgeon: Algernon Huxley, MD;  Location: [place] [facility];  Service: Cardiovascular;  Laterality: Left;  ? LOWER EXTREMITY ANGIOGRAPHY Left 10/20/2018  ? Procedure: LOWER EXTREMITY ANGIOGRAPHY;  Surgeon: Algernon Huxley, MD;  Location: [place] [facility];  Service: Cardiovascular;  Laterality: Left;  ? LOWER EXTREMITY ANGIOGRAPHY Right 11/25/2018  ? Procedure: LOWER EXTREMITY ANGIOGRAPHY;  Surgeon: Algernon Huxley, MD;  Location: [place] [facility];  Service: Cardiovascular;  Laterality: Right;  ? LOWER EXTREMITY  ANGIOGRAPHY Right 01/10/2020  ? Procedure: LOWER EXTREMITY ANGIOGRAPHY;  Surgeon: Algernon Huxley, MD;  Location: [place] [facility];  Service: Cardiovascular;  Laterality: Right;  ? LOWER EXTREMITY ANGIOGRAPHY Right 12/11/2020  ? Procedure: LOWER EXTREMITY ANGIOGRAPHY;  Surgeon: Algernon Huxley, MD;  Location: [place] [facility];  Service: Cardiovascular;  Laterality: Right;  ? LOWER EXTREMITY INTERVENTION  02/12/2018  ? Procedure: LOWER EXTREMITY INTERVENTION;  Surgeon: Algernon Huxley, MD;  Location: [place] [facility];  Service: Cardiovascular;;  ? MINOR IRRIGATION AND DEBRIDEMENT OF WOUND Left 09/10/2021  ? Procedure: IRRIGATION AND DEBRIDEMENT OF LEFT LONG FINGER WOUND;  Surgeon: Cindra Presume, MD;  [facility];  Service: Plastics;  Laterality: Left;  ? NECK SURGERY    ? TRANSMETATARSAL AMPUTATION Left 10/16/2018  ? Procedure: TRANSMETATARSAL AMPUTATION LEFT FOOT;  Surgeon: Samara Deist, DPM;  Location: [facility];  Service: Podiatry;  Laterality: Left;  ? ? ? reports that he quit smoking about 13 months ago. His smoking use included cigarettes. He has a 47.00 pack-year smoking history. He has never used smokeless tobacco. He reports that he does not drink alcohol and does not use drugs. ? ?No Known Allergies ? ?Family History  ?Problem Relation Age of Onset  ? Diabetes Mother   ? Coronary artery disease Father   ? Hypertension Sister   ? Leukemia Brother   ? ? ?Prior to Admission medications   ?Medication Sig Start Date End Date Taking? Authorizing Provider  ?ACCU-CHEK GUIDE test strip 5 (five) times daily. 06/26/20  Yes [provider]  ?apixaban (ELIQUIS) 5 MG TABS tablet Take 1 tablet (5 mg total) by mouth 2 (two) times daily. 02/12/18  Yes Gouru, Illene Silver, MD  ?aspirin EC 81 MG tablet Take 1 tablet (81 mg total) by mouth daily. 01/10/20  Yes Algernon Huxley, MD  ?atorvastatin (LIPITOR) 80 MG tablet Take 1 tablet (80 mg total) by mouth daily at 6 PM. 02/12/18  Yes Gouru, Aruna, MD  ?Blood Glucose Monitoring Suppl  (ACCU-CHEK GUIDE) w/Device KIT 5 (five) times daily. 10/27/20  Yes [provider]  ?Cleda Clarks 100 UNIT/ML KwikPen Inject 5 Units into the skin 3 (three) times daily before meals. ?Patient taking differently: Inject 10 Units into the skin 3 (three) times daily before meals. 09/19/21  Yes Dessa Phi, DO  ?losartan (COZAAR) 25 MG tablet Take 25 mg by mouth daily. 11/13/20  Yes [provider]  ?Lenon Curt SOLOSTAR 300 UNIT/ML Solostar Pen Inject 60 Units into the skin at bedtime. 02/05/22  Yes [provider]  ?TRUEPLUS 5-BEVEL PEN NEEDLES 31G X 6 MM [place]  10/25/20  Yes [provider]  ?insulin detemir (LEVEMIR) 100 UNIT/ML injection Inject 0.44 mLs (44 Units total) into the skin at bedtime. ?Patient not taking: Reported on 02/16/2022 09/19/21   Dessa Phi, DO  ?TRULICITY 7.61 YW/7.3XT SOPN Inject 0.75 mg into the skin once a week. 02/05/22   [provider]  ? ? ?Physical Exam: ?Vitals:  ? 02/15/22 2300 02/15/22 2315 02/15/22 2345 02/16/22 0030  ?BP: 119/74 (!) 163/79 116/63  132/75  ?Pulse: 93 (!) 111 96 99  ?Resp: (!) 34 (!) 28 (!) 24 (!) 21  ?Temp:      ?TempSrc:      ?SpO2: 98% 97% 96% 99%  ?Weight:      ? ? ?Physical Exam ?Vitals and nursing note reviewed.  ?Constitutional:   ?   General: He is not in acute distress. ?   Appearance: He is obese. He is not toxic-appearing or diaphoretic.  ?   Comments: Appears chronically ill.  ?HENT:  ?   Head: Normocephalic and atraumatic.  ?   Nose: Nose normal.  ?Eyes:  ?   Comments: Pupils were equal and reactive to light.  ?Cardiovascular:  ?   Rate and Rhythm: Normal rate. Rhythm irregular.  ?Pulmonary:  ?   Effort: Pulmonary effort is normal. No respiratory distress.  ?   Breath sounds: No wheezing or rales.  ?Abdominal:  ?   General: Bowel sounds are normal. There is no distension.  ?Musculoskeletal:  ?   Comments: Left BKA. ? ?Ulceration at the base of the fifth metatarsal head.  See picture.  ?Skin: ?   Comments: Right  lower extremity mildly edematous, erythematous.  ?Neurological:  ?   Mental Status: He is disoriented.  ?   Comments: Arousable for only about 5 seconds then falls back to sleep.  ?  ? ? ? ? ?Labs on Admission

## 2022-02-16 NOTE — Assessment & Plan Note (Addendum)
-   Continue eliquis and lopressor

## 2022-02-16 NOTE — Assessment & Plan Note (Addendum)
-   A1c 11.3% on 02/17/2022 - Continue Semglee and NovoLog

## 2022-02-16 NOTE — ED Notes (Addendum)
ED TO INPATIENT HANDOFF REPORT ? ?ED Nurse Name and Phone #: Deago Burruss RN [phone] ? ?S ?Name/Age/Gender ?Austin [name] ?66 y.o. ?male ?Room/Bed: 033C/033C ? ?Code Status ?  Code Status: Full Code ? ?Home/SNF/Other ?Home ?Patient oriented to: self, place, situation ?Is this baseline? Yes  ? ?Triage Complete: Triage complete  ?Chief Complaint ?Acute metabolic encephalopathy 99991111 ? ?Triage Note ?BIB [facility] from home. Fell off couch around 1600 refused to come to ED until now. Altered, difficulty speaking and ambulating. Many co-morbidities including diabetic. Hx left BKA. LNW 1600.  ? ?Allergies ?No Known Allergies ? ?Level of Care/Admitting Diagnosis ?ED Disposition   ? ? ED Disposition  ?Admit  ? Condition  ?--  ? Comment  ?Hospital Area: [facility] [id number] ? Level of Care: Telemetry Medical [104] ? May admit patient to [facility] or [facility] if equivalent level of care is available:: No ? Covid Evaluation: Asymptomatic - no recent exposure (last 10 days) testing not required ? Diagnosis: Acute metabolic encephalopathy [id number] ? Admitting Physician: Bridgett Larsson, [place] ? Attending Physician: Bridgett Larsson, [place] ? Estimated length of stay: 3 - 4 days ? Certification:: I certify this patient will need inpatient services for at least 2 midnights ?  ?  ? ?  ? ? ?B ?Medical/Surgery History ?Past Medical History:  ?Diagnosis Date  ? Coronary artery disease   ? Diabetes ([place])   ? DVT (deep venous thrombosis) ([place])   ? Gangrene of left foot ([place]) 02/09/2018  ? GERD (gastroesophageal reflux disease)   ? Hyperlipidemia   ? Hypertension   ? Peripheral vascular disease ([place])   ? ?Past Surgical History:  ?Procedure Laterality Date  ? AMPUTATION Left 10/28/2018  ? Procedure: AMPUTATION BELOW KNEE;  Surgeon: Algernon Huxley, MD;  Location: [facility];  Service: Vascular;  Laterality: Left;  ? AMPUTATION Left 09/10/2021  ? Procedure: AMPUTATION DIGIT;  Surgeon: Cindra Presume, MD;  [facility];  Service:  Plastics;  Laterality: Left;  ? AMPUTATION Left 09/14/2021  ? Procedure: AMPUTATION DIGIT left long finger and irrigation and debridement;  Surgeon: Cindra Presume, MD;  [facility];  Service: Plastics;  Laterality: Left;  ? AMPUTATION TOE Left 02/10/2018  ? Procedure: AMPUTATION TOE;  Surgeon: Sharlotte Alamo, DPM;  Location: [facility];  Service: Podiatry;  Laterality: Left;  ? APPLICATION OF WOUND VAC Left 10/16/2018  ? Procedure: APPLICATION OF WOUND VAC;  Surgeon: Samara Deist, DPM;  Location: [facility];  Service: Podiatry;  Laterality: Left;  ? DORSAL SLIT N/A 01/12/2021  ? Procedure: DORSAL SLIT;  Surgeon: Billey Co, MD;  Location: [facility];  Service: Urology;  Laterality: N/A;  ? groin surgery    ? IRRIGATION AND DEBRIDEMENT FOOT Left 09/30/2018  ? Procedure: IRRIGATION AND DEBRIDEMENT FOOT;  Surgeon: Sharlotte Alamo, DPM;  Location: [facility];  Service: Podiatry;  Laterality: Left;  ? LOWER EXTREMITY ANGIOGRAPHY Left 02/12/2018  ? Procedure: Lower Extremity Angiography;  Surgeon: Algernon Huxley, MD;  Location: [place] [facility];  Service: Cardiovascular;  Laterality: Left;  ? LOWER EXTREMITY ANGIOGRAPHY Left 10/01/2018  ? Procedure: Lower Extremity Angiography;  Surgeon: Algernon Huxley, MD;  Location: [place] [facility];  Service: Cardiovascular;  Laterality: Left;  ? LOWER EXTREMITY ANGIOGRAPHY Left 10/19/2018  ? Procedure: Lower Extremity Angiography;  Surgeon: Algernon Huxley, MD;  Location: [place] [facility];  Service: Cardiovascular;  Laterality: Left;  ? LOWER EXTREMITY ANGIOGRAPHY Left 10/20/2018  ? Procedure: LOWER EXTREMITY ANGIOGRAPHY;  Surgeon: Algernon Huxley, MD;  Location: [place] [facility];  Service: Cardiovascular;  Laterality: Left;  ? LOWER EXTREMITY ANGIOGRAPHY Right 11/25/2018  ? Procedure: LOWER EXTREMITY ANGIOGRAPHY;  Surgeon: Algernon Huxley, MD;  Location: [place] [facility];  Service: Cardiovascular;  Laterality: Right;  ? LOWER EXTREMITY ANGIOGRAPHY Right 01/10/2020  ? Procedure: LOWER  EXTREMITY ANGIOGRAPHY;  Surgeon: Algernon Huxley, MD;  Location: [place] [facility];  Service: Cardiovascular;  Laterality: Right;  ? LOWER EXTREMITY ANGIOGRAPHY Right 12/11/2020  ? Procedure: LOWER EXTREMITY ANGIOGRAPHY;  Surgeon: Algernon Huxley, MD;  Location: [place] [facility];  Service: Cardiovascular;  Laterality: Right;  ? LOWER EXTREMITY INTERVENTION  02/12/2018  ? Procedure: LOWER EXTREMITY INTERVENTION;  Surgeon: Algernon Huxley, MD;  Location: [place] [facility];  Service: Cardiovascular;;  ? MINOR IRRIGATION AND DEBRIDEMENT OF WOUND Left 09/10/2021  ? Procedure: IRRIGATION AND DEBRIDEMENT OF LEFT LONG FINGER WOUND;  Surgeon: Cindra Presume, MD;  [facility];  Service: Plastics;  Laterality: Left;  ? NECK SURGERY    ? TRANSMETATARSAL AMPUTATION Left 10/16/2018  ? Procedure: TRANSMETATARSAL AMPUTATION LEFT FOOT;  Surgeon: Samara Deist, DPM;  Location: [facility];  Service: Podiatry;  Laterality: Left;  ?  ? ?A ?IV Location/Drains/Wounds ?Patient Lines/Drains/Airways Status   ? ? Active Line/Drains/Airways   ? ? Name Placement date Placement time Site Days  ? Peripheral IV 02/16/22 22 G Anterior;Right Wrist 02/16/22  0105  Wrist  less than 1  ? Peripheral IV 02/16/22 20 G 2.5" Anterior;Proximal;Right Forearm 02/16/22  0145  Forearm  less than 1  ? Incision (Closed) 01/12/21 Penis 01/12/21  1323  -- 400  ? Incision (Closed) 09/10/21 Arm Left 09/10/21  1718  -- 159  ? Incision (Closed) 09/14/21 Hand Left 09/14/21  1850  -- 155  ? Pressure Injury 10/27/18 Stage II -  Partial thickness loss of dermis presenting as a shallow open ulcer with a red, pink wound bed without slough. patient noted to have skin opening between upper buttocks-foam placed 10/27/18  2100  -- 1208  ? Pressure Injury 10/27/18 Stage I -  Intact skin with non-blanchable redness of a localized area usually over a bony prominence. patient noted to have large bruising area and knot-pt states does not hurt 10/27/18  2100  -- 1208  ? Wound /  Incision (Open or Dehisced) 03/31/21 Diabetic ulcer Foot Right;Lower;Posterior 03/31/21  0757  Foot  322  ? Wound / Incision (Open or Dehisced) 09/06/21 Diabetic ulcer Pretibial Distal;Right;Lower 09/06/21  1645  Pretibial  163  ? Wound / Incision (Open or Dehisced) 09/17/21 Skin tear Buttocks Left 09/17/21  1314  Buttocks  152  ? ?  ?  ? ?  ? ? ?Intake/Output Last 24 hours ? ?Intake/Output Summary (Last 24 hours) at 02/16/2022 2231 ?Last data filed at 02/16/2022 [id number] ?Gross per 24 hour  ?Intake --  ?Output 1200 ml  ?Net -1200 ml  ? ? ?Labs/Imaging ?Results for orders placed or performed during the hospital encounter of 02/15/22 (from the past 48 hour(s))  ?Protime-INR     Status: Abnormal  ? Collection Time: 02/15/22  8:51 PM  ?Result Value Ref Range  ? Prothrombin Time 16.5 (H) 11.4 - 15.2 seconds  ? INR 1.3 (H) 0.8 - 1.2  ?  Comment: (NOTE) ?INR goal varies based on device and disease states. ?Performed at [facility], [place] [address], [place], [place] ?[address] ?  ?APTT     Status: None  ?  Collection Time: 02/15/22  8:51 PM  ?Result Value Ref Range  ? aPTT 33 24 - 36 seconds  ?  Comment: Performed at [facility], [place] [address], [place], [place] [address]  ?CBC     Status: Abnormal  ? Collection Time: 02/15/22  8:51 PM  ?Result Value Ref Range  ? WBC 16.9 (H) 4.0 - 10.5 K/uL  ? RBC 5.19 4.22 - 5.81 MIL/uL  ? Hemoglobin 14.4 13.0 - 17.0 g/dL  ? HCT 43.6 39.0 - 52.0 %  ? MCV 84.0 80.0 - 100.0 fL  ? MCH 27.7 26.0 - 34.0 pg  ? MCHC 33.0 30.0 - 36.0 g/dL  ? RDW 14.7 11.5 - 15.5 %  ? Platelets 164 150 - 400 K/uL  ? nRBC 0.0 0.0 - 0.2 %  ?  Comment: Performed at [facility], [place] [address] [place], [place] [address]  ?Differential     Status: Abnormal  ? Collection Time: 02/15/22  8:51 PM  ?Result Value Ref Range  ? Neutrophils Relative % 81 %  ? Neutro Abs 13.7 (H) 1.7 - 7.7 K/uL  ? Lymphocytes Relative 10 %  ? Lymphs Abs 1.7 0.7 - 4.0 K/uL  ? Monocytes Relative 8 %  ? Monocytes Absolute 1.3 (H)  0.1 - 1.0 K/uL  ? Eosinophils Relative 0 %  ? Eosinophils Absolute 0.0 0.0 - 0.5 K/uL  ? Basophils Relative 0 %  ? Basophils Absolute 0.0 0.0 - 0.1 K/uL  ? Immature Granulocytes 1 %  ? Abs Immature Granulocytes 0.12 (H) 0.

## 2022-02-16 NOTE — Progress Notes (Signed)
Please see full neurology consult from Dr. Lorrin Goodell overnight. Suspect encephalopathy 2/2 sepsis, likely 2/2 diabetic foot ulcer. Unclear if L sided weakness is acute or chronic. MRI brain wo contrast is pending, if negative for acute stroke no further inpatient neurologic workup indicated. ? ?We will f/u MRI results ? ?Su Monks, MD ?[facility] ?[phone] ? ?If 7pm- 7am, please page neurology on call as listed in [place]. ? ?

## 2022-02-16 NOTE — ED Notes (Signed)
Breakfast order placed ?

## 2022-02-16 NOTE — Progress Notes (Signed)
Inpatient Diabetes Program Recommendations ? ?AACE/ADA: New Consensus Statement on Inpatient Glycemic Control (2015) ? ?Target Ranges:  Prepandial:   less than 140 mg/dL ?     Peak postprandial:   less than 180 mg/dL (1-2 hours) ?     Critically ill patients:  140 - 180 mg/dL  ? ?Lab Results  ?Component Value Date  ? GLUCAP 272 (H) 02/16/2022  ? HGBA1C 12.8 (H) 09/07/2021  ? ? ?Review of Glycemic Control ? ?Diabetes history: DM2 ?Outpatient Diabetes medications: Toujeo 60 QHS, Humalog 10 TID, Trulicity0.75 weekly ?Current orders for Inpatient glycemic control: Novolog 0-15 units TID + 2 units TID ? ?HgbA1C pending. CBGs 218, 272 today thus far ?Needs part of basal insulin ? ?Inpatient Diabetes Program Recommendations:   ? ?Add Semglee 30 units QHS ? ?Follow closely. ? ?Thank you. ?Ailene Ards, RD, LDN, CDE ?Inpatient Diabetes Coordinator ?[phone]  ? ? ? ? ? ? ?

## 2022-02-16 NOTE — Assessment & Plan Note (Addendum)
-   MRI brain with remote infarct.  Seen by neurology (5/14: MRI brain negative for acute stroke. No further inpatient neurologic workup indicated. Neurology to sign off) -TSH and ammonia within normal limits

## 2022-02-16 NOTE — Assessment & Plan Note (Addendum)
Continue cozaar.  

## 2022-02-16 NOTE — Assessment & Plan Note (Addendum)
-   initially had fever, tachycardia; source foot ulcer - see above

## 2022-02-16 NOTE — Progress Notes (Signed)
?PROGRESS NOTE ? ? ? ?LANDY BULGER  [id number] DOB: November 25, 1955 DOA: 02/15/2022 ?PCP: Theotis Burrow, MD  ? ?Brief Narrative:  ?66 year old with history of DM2, HTN, PVD, A-fib on Eliquis, HTN, status post left BKA came to the ED after falling off the couch due to weakness and change in mental status.  Upon admission patient was noted to be in sepsis secondary to likely diabetic foot ulcer. ? ? ?Assessment & Plan: ? Principal Problem: ?  Acute metabolic encephalopathy ?Active Problems: ?  Diabetic foot ulcer associated with type 2 diabetes mellitus ([place]) ?  Sepsis with acute organ dysfunction without septic shock (HCC) ?  Essential hypertension ?  Type 2 diabetes mellitus with diabetic peripheral angiopathy without gangrene ([place]) ?  Status post below-knee amputation (HCC) - left side ?  Atrial fibrillation, chronic ([place]) ?  ? ? ?Assessment and Plan: ?* Acute metabolic encephalopathy ?Routine blood work unremarkable.  Mentation has improved.  Ammonia levels are normal, TSH normal ?- Procalcitonin 0.3 ?- MRI brain ? ?Sepsis with acute organ dysfunction without septic shock (HCC) ?Infected diabetic right foot ulcer with concerns of osteomyelitis ?Sepsis evidenced by leukocytosis, fever and source.  Concerns of osteomyelitis ?- IV vancomycin and cefepime ? ?Diabetic foot ulcer associated with type 2 diabetes mellitus ([place]) ?Type 2 diabetes mellitus with diabetic peripheral angiopathy without gangrene ([place]) ?- Hemoglobin A1c pending.  On outpatient Toujeo and Trulicity?  Versus Levemir? ?- Sliding scale and Accu-Cheks ?-Diabetic coordinator ? ?Atrial fibrillation, chronic ([place]) ?Continue eliquis ? ?Status post below-knee amputation (HCC) - left side ?Chronic. Stable. ? ?Essential hypertension ?Continue cozaar ? ? ? ?DVT prophylaxis: Eliquis ?Code Status: Full code ?Family Communication:   ? ?Status is: Inpatient ?Remains inpatient appropriate because: Maintain hospital stay for IV antibiotics, culture  monitoring and MRI of the foot due to less concerns of osteomyelitis. ? ? ? ? ? ?Subjective: ?Patient reports of pain swelling and drainage of the right foot of the plantar surface. ? ? ? ?Examination: ? ?General exam: Appears calm and comfortable  ?Respiratory system: Clear to auscultation. Respiratory effort normal. ?Cardiovascular system: S1 & S2 heard, RRR. No JVD, murmurs, rubs, gallops or clicks. No pedal edema. ?Gastrointestinal system: Abdomen is nondistended, soft and nontender. No organomegaly or masses felt. Normal bowel sounds heard. ?Central nervous system: Alert and oriented. No focal neurological deficits. ?Extremities: Left-sided BKA noted. ?Skin: Right lower extremity foot diabetic foot ulcer noted, warmth and swelling around it.  Mild drainage. ?Psychiatry: Judgement and insight appear normal. Mood & affect appropriate.  ? ? ? ?Objective: ?Vitals:  ? 02/16/22 0100 02/16/22 0345 02/16/22 0720 02/16/22 0730  ?BP:  130/77 138/71 (!) 128/104  ?Pulse:  82 80 80  ?Resp:  (!) 26 18 16   ?Temp: 99.6 ?F (37.6 ?C)     ?TempSrc: Oral     ?SpO2:  94% 97% 100%  ?Weight:      ? ? ?Intake/Output Summary (Last 24 hours) at 02/16/2022 0741 ?Last data filed at 02/16/2022 [id number] ?Gross per 24 hour  ?Intake --  ?Output 1200 ml  ?Net -1200 ml  ? ?Filed Weights  ? 02/15/22 2216  ?Weight: 111.1 kg  ? ? ? ?Data Reviewed:  ? ?CBC: ?Recent Labs  ?Lab 02/15/22 ?2051 02/15/22 ?2055 02/16/22 ?0530  ?WBC 16.9*  --  13.6*  ?NEUTROABS 13.7*  --  10.1*  ?HGB 14.4 15.0 14.5  ?HCT 43.6 44.0 43.5  ?MCV 84.0  --  83.7  ?PLT 164  --  167  ? ?Basic  Metabolic Panel: ?Recent Labs  ?Lab 02/15/22 ?2051 02/15/22 ?2055 02/16/22 ?0530  ?NA 136 136 136  ?K 3.8 3.8 3.8  ?CL 103 101 104  ?CO2 23  --  27  ?GLUCOSE 302* 296* 243*  ?BUN 14 14 10   ?CREATININE 1.24 1.00 1.04  ?CALCIUM 9.1  --  8.8*  ?MG  --   --  1.5*  ? ?GFR: ?Estimated Creatinine Clearance: 95.4 mL/min (by C-G formula based on SCr of 1.04 mg/dL). ?Liver Function Tests: ?Recent Labs  ?Lab  02/15/22 ?2051 02/16/22 ?0530  ?AST 19 16  ?ALT 18 16  ?ALKPHOS 63 64  ?BILITOT 0.9 1.2  ?PROT 7.0 6.9  ?ALBUMIN 3.1* 2.7*  ? ?No results for input(s): LIPASE, AMYLASE in the last 168 hours. ?Recent Labs  ?Lab 02/16/22 ?0530  ?AMMONIA 13  ? ?Coagulation Profile: ?Recent Labs  ?Lab 02/15/22 ?2051  ?INR 1.3*  ? ?Cardiac Enzymes: ?No results for input(s): CKTOTAL, CKMB, CKMBINDEX, TROPONINI in the last 168 hours. ?BNP (last 3 results) ?No results for input(s): PROBNP in the last 8760 hours. ?HbA1C: ?No results for input(s): HGBA1C in the last 72 hours. ?CBG: ?Recent Labs  ?Lab 02/15/22 ?2054  ?GLUCAP 265*  ? ?Lipid Profile: ?No results for input(s): CHOL, HDL, LDLCALC, TRIG, CHOLHDL, LDLDIRECT in the last 72 hours. ?Thyroid Function Tests: ?Recent Labs  ?  02/16/22 ?0530  ?TSH 0.831  ? ?Anemia Panel: ?Recent Labs  ?  02/16/22 ?0530  ?VITAMINB12 746  ? ?Sepsis Labs: ?Recent Labs  ?Lab 02/15/22 ?2130 02/16/22 ?0239 02/16/22 ?0530  ?PROCALCITON  --   --  0.34  ?LATICACIDVEN 2.0* 2.6*  --   ? ? ?No results found for this or any previous visit (from the past 240 hour(s)).  ? ? ? ? ? ?Radiology Studies: ?DG Pelvis Portable ? ?Result Date: 02/16/2022 ?CLINICAL DATA:  Pain. EXAM: PORTABLE PELVIS 1-2 VIEWS COMPARISON:  None Available. FINDINGS: There is no evidence of an acute pelvic fracture or diastasis. No pelvic bone lesions are seen. Moderate severity degenerative changes seen involving both hips in the form of joint space narrowing and acetabular sclerosis. Multiple radiopaque vascular stents are present within the pelvis and bilateral lower extremities. Mildly diluted radiopaque contrast is seen within the urinary bladder. IMPRESSION: 1. No acute osseous abnormality. 2. Moderate severity degenerative changes of both hips. Electronically Signed   By: Virgina Norfolk M.D.   On: 02/16/2022 01:31  ? ?DG Chest Port 1 View ? ?Result Date: 02/15/2022 ?CLINICAL DATA:  Sepsis and right foot wound. EXAM: PORTABLE CHEST 1 VIEW  COMPARISON:  October 22, 2018 FINDINGS: Multiple overlying radiopaque cardiac lead wires are seen. The heart size and mediastinal contours are within normal limits. Both lungs are clear. The visualized skeletal structures are unremarkable. IMPRESSION: No active disease. Electronically Signed   By: Virgina Norfolk M.D.   On: 02/15/2022 22:18  ? ?DG Foot Complete Right ? ?Result Date: 02/15/2022 ?CLINICAL DATA:  Sepsis.  Right foot wound. EXAM: RIGHT FOOT COMPLETE - 3+ VIEW COMPARISON:  None Available. FINDINGS: There is no evidence of fracture or dislocation. The second through fifth right toes are partially flexed in position and subsequently limited in evaluation. Mild degenerative changes are seen along the dorsal aspect of the proximal to mid right foot. Mild dorsal soft tissue swelling is also seen. IMPRESSION: 1. Mild degenerative changes along the dorsal aspect of the proximal to mid right foot. 2. Mild dorsal soft tissue swelling without an acute osseous abnormality. Electronically Signed   By:  Virgina Norfolk M.D.   On: 02/15/2022 22:21  ? ?CT HEAD CODE STROKE WO CONTRAST ? ?Result Date: 02/15/2022 ?CLINICAL DATA:  Code stroke. Neuro deficit, acute, stroke suspected. Left-sided deficits and slurred speech. EXAM: CT HEAD WITHOUT CONTRAST TECHNIQUE: Contiguous axial images were obtained from the base of the skull through the vertex without intravenous contrast. RADIATION DOSE REDUCTION: This exam was performed according to the departmental dose-optimization program which includes automated exposure control, adjustment of the mA and/or kV according to patient size and/or use of iterative reconstruction technique. COMPARISON:  None Available. FINDINGS: Brain: Within limitations of moderate motion artifact, no acute cortically based infarct, intracranial hemorrhage, mass, midline shift, or extra-axial fluid collection is identified. The ventricles and sulci are within normal limits for age. Patchy hypodensities  in the cerebral white matter bilaterally are nonspecific but compatible with mild chronic small vessel ischemic disease. Vascular: Calcified atherosclerosis at the skull base. No hyperdense vessel. Skull: No

## 2022-02-16 NOTE — Subjective & Objective (Signed)
CC: altered mental status ?HPI: ?66 year old African-American male history of type 2 diabetes, hypertension, peripheral vascular disease, A-fib on chronic anticoagulation, hypertension, status post left BKA, presents to the ER from home.  Patient reportedly fell off a couch.  EMS was called but he refused to go with EMS.  Patient fell again.  This time he had altered mental status.  Reportedly had left-sided weakness. ? ?Patient was finally brought in by EMS. ? ?Patient unable to give any history due to encephalopathy.  Reportedly he had been feeling unwell.  Has had diarrhea for a day. ? ?Per EMS documentation, he was hypotensive.  He was given 5 cc bolus. ? ?Code stroke was called but canceled due to patient being on Eliquis and him being outside the [facility] window. ? ?While in ER, he spiked a fever 103.1. ? ?Lactic acid is elevated 2.0.  White count was elevated 16.9. ? ?[facility] hospitalist contacted for admission. ?

## 2022-02-16 NOTE — Assessment & Plan Note (Addendum)
-  S/p partial fifth ray amputation right foot on 02/27/2022 -Group B strep and now rare staph (MSSA) noted on wound culture from surgery;  -Weight-bear minimally RLE for transition purposes only per podiatry recommendations

## 2022-02-16 NOTE — Assessment & Plan Note (Addendum)
-   Patient has prosthesis

## 2022-02-17 ENCOUNTER — Encounter (HOSPITAL_COMMUNITY): Payer: Self-pay | Admitting: Internal Medicine

## 2022-02-17 ENCOUNTER — Other Ambulatory Visit: Payer: Self-pay

## 2022-02-17 DIAGNOSIS — E1169 Type 2 diabetes mellitus with other specified complication: Secondary | ICD-10-CM

## 2022-02-17 DIAGNOSIS — E11621 Type 2 diabetes mellitus with foot ulcer: Secondary | ICD-10-CM | POA: Diagnosis not present

## 2022-02-17 DIAGNOSIS — L97513 Non-pressure chronic ulcer of other part of right foot with necrosis of muscle: Secondary | ICD-10-CM | POA: Diagnosis not present

## 2022-02-17 DIAGNOSIS — E1151 Type 2 diabetes mellitus with diabetic peripheral angiopathy without gangrene: Secondary | ICD-10-CM | POA: Diagnosis not present

## 2022-02-17 DIAGNOSIS — A419 Sepsis, unspecified organism: Secondary | ICD-10-CM | POA: Diagnosis not present

## 2022-02-17 DIAGNOSIS — G9341 Metabolic encephalopathy: Secondary | ICD-10-CM | POA: Diagnosis not present

## 2022-02-17 DIAGNOSIS — I482 Chronic atrial fibrillation, unspecified: Secondary | ICD-10-CM | POA: Diagnosis not present

## 2022-02-17 DIAGNOSIS — M86171 Other acute osteomyelitis, right ankle and foot: Secondary | ICD-10-CM

## 2022-02-17 DIAGNOSIS — L03115 Cellulitis of right lower limb: Secondary | ICD-10-CM

## 2022-02-17 DIAGNOSIS — I10 Essential (primary) hypertension: Secondary | ICD-10-CM | POA: Diagnosis not present

## 2022-02-17 LAB — GLUCOSE, CAPILLARY
Glucose-Capillary: 235 mg/dL — ABNORMAL HIGH (ref 70–99)
Glucose-Capillary: 269 mg/dL — ABNORMAL HIGH (ref 70–99)
Glucose-Capillary: 296 mg/dL — ABNORMAL HIGH (ref 70–99)
Glucose-Capillary: 299 mg/dL — ABNORMAL HIGH (ref 70–99)

## 2022-02-17 LAB — BASIC METABOLIC PANEL
Anion gap: 10 (ref 5–15)
BUN: 10 mg/dL (ref 8–23)
CO2: 21 mmol/L — ABNORMAL LOW (ref 22–32)
Calcium: 8.4 mg/dL — ABNORMAL LOW (ref 8.9–10.3)
Chloride: 104 mmol/L (ref 98–111)
Creatinine, Ser: 0.82 mg/dL (ref 0.61–1.24)
GFR, Estimated: >60 mL/min (ref >60)
Glucose, Bld: 243 mg/dL — ABNORMAL HIGH (ref 70–99)
Potassium: 4.4 mmol/L (ref 3.5–5.1)
Sodium: 135 mmol/L (ref 135–145)

## 2022-02-17 LAB — CBC
HCT: 38.2 % — ABNORMAL LOW (ref 39.0–52.0)
Hemoglobin: 13.2 g/dL (ref 13.0–17.0)
MCH: 28.6 pg (ref 26.0–34.0)
MCHC: 34.6 g/dL (ref 30.0–36.0)
MCV: 82.7 fL (ref 80.0–100.0)
Platelets: 127 10*3/uL — ABNORMAL LOW (ref 150–400)
RBC: 4.62 MIL/uL (ref 4.22–5.81)
RDW: 15.1 % (ref 11.5–15.5)
WBC: 8.5 10*3/uL (ref 4.0–10.5)
nRBC: 0 % (ref 0.0–0.2)

## 2022-02-17 LAB — MAGNESIUM: Magnesium: 1.8 mg/dL (ref 1.7–2.4)

## 2022-02-17 LAB — HEMOGLOBIN A1C
Hgb A1c MFr Bld: 11.3 % — ABNORMAL HIGH (ref 4.8–5.6)
Mean Plasma Glucose: 277.61 mg/dL

## 2022-02-17 LAB — PROCALCITONIN: Procalcitonin: 0.19 ng/mL

## 2022-02-17 LAB — HEPATITIS C ANTIBODY: HCV Ab: NONREACTIVE

## 2022-02-17 LAB — LACTIC ACID, PLASMA: Lactic Acid, Venous: 1.5 mmol/L (ref 0.5–1.9)

## 2022-02-17 LAB — HIV ANTIBODY (ROUTINE TESTING W REFLEX): HIV Screen 4th Generation wRfx: NONREACTIVE

## 2022-02-17 MED ORDER — INSULIN GLARGINE-YFGN 100 UNIT/ML ~~LOC~~ SOLN
30.0000 [IU] | Freq: Every day | SUBCUTANEOUS | Status: DC
  Administered 2022-02-17: 30 [IU] via SUBCUTANEOUS
  Filled 2022-02-17 (×2): qty 0.3

## 2022-02-17 NOTE — Progress Notes (Addendum)
?PROGRESS NOTE ? ? ? ?Austin [name]  [id number] DOB: Jan 17, 1956 DOA: 02/15/2022 ?PCP: Theotis Burrow, MD  ? ?Brief Narrative:  ?66 year old with history of DM2, HTN, PVD, A-fib on Eliquis, HTN, status post left BKA came to the ED after falling off the couch due to weakness and change in mental status.  Upon admission patient was noted to be in sepsis secondary to likely diabetic foot ulcer.  MRI was positive for right foot fifth digit osteomyelitis.  Podiatry and infectious disease consulted ? ? ?Assessment & Plan: ? Principal Problem: ?  Acute metabolic encephalopathy ?Active Problems: ?  Diabetic foot ulcer associated with type 2 diabetes mellitus ([place]) ?  Sepsis with acute organ dysfunction without septic shock (HCC) ?  Essential hypertension ?  Type 2 diabetes mellitus with diabetic peripheral angiopathy without gangrene ([place]) ?  Status post below-knee amputation (HCC) - left side ?  Atrial fibrillation, chronic ([place]) ?  ? ? ?Assessment and Plan: ?* Acute metabolic encephalopathy ?Routine blood work unremarkable.  Mentation has improved.  Ammonia levels are normal, TSH normal ?- Procalcitonin 0.3 ?- MRI brain-shows remote infarct, no acute CVA ? 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  Concerns of osteomyelitis ?- IV vancomycin and cefepime ?- MRI foot shows digital fist metatarsal osteomyelitis with surrounding muscular edema ?-Podiatry, [facility] and infectious disease has been consulted ?-12/11/20 Patient had PCI and balloon angioplasty of right lower extremity, stent placement of left iliac. Dr Carlis Abbott from Vascular will consult on him as well for possible revascularization.  ? ?Diabetic foot ulcer associated with type 2 diabetes mellitus ([place]) ?Type 2 diabetes mellitus with diabetic peripheral angiopathy without gangrene ([place]) ?- Hemoglobin A1c 11.3 on outpatient Toujeo  and Trulicity?  Versus Levemir? ?- Sliding scale and Accu-Cheks ?-Diabetic coordinator ?- Semglee 30 units at bedtime ? ?Atrial fibrillation, chronic ([place]) ?Continue eliquis ? ?ADDENDUM 130PM ?HOLD ELIQUIS ~48HRS PER [facility] FOR POSSIBLE INTERVENTION LATER THIS WEEK ? ?Status post below-knee amputation (HCC) - left side ?Chronic. Stable. ? ?Essential hypertension ?Continue cozaar ? ?DVT prophylaxis: Eliquis ?Code Status: Full code ?Family Communication:   ? ?Status is: Inpatient ?Remains inpatient appropriate because: Continue hospital stay for IV antibiotics.  Will need podiatry and infectious disease evaluation ? ? ? ? ? ?Subjective: ?Seen and examined at bedside.  Still has right lower extremity pain and swelling, slightly improved compared to yesterday. ? ?Examination: ? ?General exam: Appears calm and comfortable  ?Respiratory system: Clear to auscultation. Respiratory effort normal. ?Cardiovascular system: S1 & S2 heard, RRR. No JVD, murmurs, rubs, gallops or clicks. No pedal edema. ?Gastrointestinal system: Abdomen is nondistended, soft and nontender. No organomegaly or masses felt. Normal bowel sounds heard. ?Central nervous system: Alert and oriented. No focal neurological deficits. ?Extremities: Left-sided BKA ?Skin: Right lower foot diabetic foot ulcer noted with surrounding erythema and warmth extending up to his shins. ?Psychiatry: Judgement and insight appear normal. Mood & affect appropriate.  ? ? ? ?Objective: ?Vitals:  ? 02/16/22 2339 02/16/22 2340 02/17/22 0246 02/17/22 0327  ?BP:  127/76  111/79  ?Pulse:  90  83  ?Resp:  19  20  ?Temp:  98.8 ?F (37.1 ?C)  97.8 ?F (36.6 ?C)  ?TempSrc:  Oral  Oral  ?SpO2: 98% 100%  98%  ?Weight: 106.1 kg  106.1 kg   ? ? ?Intake/Output Summary (Last 24 hours) at 02/17/2022 0724 ?Last data filed at 02/17/2022 0500 ?Johney Maine  per 24 hour  ?Intake --  ?Output 1600 ml  ?Net -1600 ml  ? ?Filed Weights  ? 02/15/22 2216 02/16/22 2339 02/17/22 0246  ?Weight: 111.1 kg 106.1 kg 106.1 kg   ? ? ? ?Data Reviewed:  ? ?CBC: ?Recent Labs  ?Lab 02/15/22 ?2051 02/15/22 ?2055 02/16/22 ?0530 02/17/22 ?0052  ?WBC 16.9*  --  13.6* 8.5  ?NEUTROABS 13.7*  --  10.1*  --   ?HGB 14.4 15.0 14.5 13.2  ?HCT 43.6 44.0 43.5 38.2*  ?MCV 84.0  --  83.7 82.7  ?PLT 164  --  167 127*  ? ?Basic Metabolic Panel: ?Recent Labs  ?Lab 02/15/22 ?2051 02/15/22 ?2055 02/16/22 ?0530 02/17/22 ?0052  ?NA 136 136 136 135  ?K 3.8 3.8 3.8 4.4  ?CL 103 101 104 104  ?CO2 23  --  27 21*  ?GLUCOSE 302* 296* 243* 243*  ?BUN 14 14 10 10   ?CREATININE 1.24 1.00 1.04 0.82  ?CALCIUM 9.1  --  8.8* 8.4*  ?MG  --   --  1.5* 1.8  ? ?GFR: ?Estimated Creatinine Clearance: 118.4 mL/min (by C-G formula based on SCr of 0.82 mg/dL). ?Liver Function Tests: ?Recent Labs  ?Lab 02/15/22 ?2051 02/16/22 ?0530  ?AST 19 16  ?ALT 18 16  ?ALKPHOS 63 64  ?BILITOT 0.9 1.2  ?PROT 7.0 6.9  ?ALBUMIN 3.1* 2.7*  ? ?No results for input(s): LIPASE, AMYLASE in the last 168 hours. ?Recent Labs  ?Lab 02/16/22 ?0530  ?AMMONIA 13  ? ?Coagulation Profile: ?Recent Labs  ?Lab 02/15/22 ?2051  ?INR 1.3*  ? ?Cardiac Enzymes: ?No results for input(s): CKTOTAL, CKMB, CKMBINDEX, TROPONINI in the last 168 hours. ?BNP (last 3 results) ?No results for input(s): PROBNP in the last 8760 hours. ?HbA1C: ?Recent Labs  ?  02/17/22 ?0052  ?HGBA1C 11.3*  ? ?CBG: ?Recent Labs  ?Lab 02/15/22 ?2054 02/16/22 ?0804 02/16/22 ?1214 02/16/22 ?1826 02/16/22 ?2253  ?GLUCAP 265* 218* 272* 238* 215*  ? ?Lipid Profile: ?No results for input(s): CHOL, HDL, LDLCALC, TRIG, CHOLHDL, LDLDIRECT in the last 72 hours. ?Thyroid Function Tests: ?Recent Labs  ?  02/16/22 ?0530  ?TSH 0.831  ? ?Anemia Panel: ?Recent Labs  ?  02/16/22 ?0530  ?VITAMINB12 746  ? ?Sepsis Labs: ?Recent Labs  ?Lab 02/15/22 ?2130 02/16/22 ?0239 02/16/22 ?0530 02/17/22 ?0052  ?PROCALCITON  --   --  0.34 0.19  ?LATICACIDVEN 2.0* 2.6*  --  1.5  ? ? ?Recent Results (from the past 240 hour(s))  ?Culture, blood (single)     Status: None (Preliminary result)   ? Collection Time: 02/15/22  9:27 PM  ? Specimen: BLOOD  ?Result Value Ref Range Status  ? Specimen Description BLOOD LEFT ANTECUBITAL  Final  ? Special Requests   Final  ?  BOTTLES DRAWN AEROBIC AND ANAEROBIC Blood Culture results may not be optimal due to an excessive volume of blood received in culture bottles  ? Culture   Final  ?  NO GROWTH < 12 HOURS ?Performed at [facility], [place] [address], [place], [place] [address] ?  ? Report Status PENDING  Incomplete  ?  ? ? ? ? ? ?Radiology Studies: ?MR BRAIN WO CONTRAST ? ?Result Date: 02/16/2022 ?CLINICAL DATA:  Initial evaluation for acute delirium. EXAM: MRI HEAD WITHOUT CONTRAST TECHNIQUE: Multiplanar, multiecho pulse sequences of the brain and surrounding structures were obtained without intravenous contrast. COMPARISON:  Prior CTs from 02/15/2022. FINDINGS: Brain: Generalized age-related cerebral atrophy. Patchy and confluent T2/FLAIR hyperintensity involving the periventricular and  deep white matter both cerebral hemispheres as well as the deep gray nuclei and pons, most consistent with chronic microvascular ischemic disease, moderately advanced in nature. Small remote left cerebellar infarct noted. No evidence for acute or subacute ischemia. Gray-white matter differentiation maintained. Or is of chronic cortical infarction. No acute or chronic intracranial blood products. No mass lesion, mass effect or midline shift. No hydrocephalus or extra-axial fluid collection. Pituitary gland suprasellar region normal. Vascular: Major intracranial vascular flow voids are maintained. Skull and upper cervical spine: Craniocervical junction normal. Bone marrow signal intensity normal. No scalp soft tissue abnormality. Sinuses/Orbits: Prior bilateral ocular lens replacement. Scattered mucosal thickening noted throughout the paranasal sinuses. Trace bilateral mastoid effusions, of doubtful significance. Other: None. IMPRESSION: 1. No acute intracranial abnormality. 2.  Age-related cerebral atrophy with moderately advanced chronic microvascular ischemic disease. 3. Small remote left cerebellar infarct. Electronically Signed   By: Jeannine Boga M.D.   On: 05/13

## 2022-02-17 NOTE — Plan of Care (Signed)
Neurology plan of care ? ?MRI brain negative for acute stroke. No further inpatient neurologic workup indicated. Neurology to sign off, but please re-engage if additional neurologic concerns arise. ? ?Su Monks, MD ?[facility] ?[phone] ? ?If 7pm- 7am, please page neurology on call as listed in [place]. ? ?

## 2022-02-17 NOTE — Consult Note (Signed)
Podiatry Consult Note ? ?To: Dr. Gerlean Ren hospitalist ?Reason for consult: Right diabetic foot ulcer with osteomyelitis ?From: Dr. Landis Martins podiatry [facility] ? ?HPI: ?Austin [name] is a 66 y.o. male patient who seen at bedside for evaluation of right foot infection.  Patient reports that he typically sees Dr. Cleda Mccreedy at [facility] clinic who has been treating the wound and debriding it every time and states that he knows he has had this sore for a really long time admits some swelling but denies any pain.  Patient reports that he is fearful of losing another leg but he is willing to lose the toe but does not want to lose the leg.  Patient denies nausea vomiting fever chills or any other constitutional symptoms at this time. ? ?Patient Active Problem List  ? Diagnosis Date Noted  ? Acute metabolic encephalopathy 72/53/6644  ? Diabetic foot ulcer associated with type 2 diabetes mellitus ([place]) 02/16/2022  ? Sepsis with acute organ dysfunction without septic shock ([place]) 02/16/2022  ? Atrial fibrillation, chronic ([place]) 02/16/2022  ? Infection of left hand 09/06/2021  ? Pressure injury of coccygeal region, stage 1 09/06/2021  ? Coronary artery disease   ? Peripheral vascular disease ([place])   ? Lower limb ulcer, calf ([place]) 07/13/2021  ? Acute osteomyelitis of right foot ([place]) 03/30/2021  ? Diabetes mellitus type 2, uncomplicated ([place]) 03/47/4259  ? Atherosclerosis of native arteries of the extremities with ulceration ([place]) 01/01/2019  ? 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 Type 2 diabetes mellitus with diabetic peripheral angiopathy without gangrene ([place]) 10/14/2018  ? Malnutrition of moderate degree 10/02/2018  ? Foot ulcer ([place]) 09/29/2018  ? Atherosclerosis of native arteries of extremity with intermittent claudication ([place]) 05/25/2017  ? Diabetes ([place]) 05/25/2017  ? Essential hypertension 05/25/2017  ? Hyperlipidemia 05/25/2017  ? ? ?No current facility-administered  medications on file prior to encounter.  ? ?Current Outpatient Medications on File Prior to Encounter  ?Medication Sig Dispense Refill  ? ACCU-CHEK GUIDE test strip 5 (five) times daily.    ? apixaban (ELIQUIS) 5 MG TABS tablet Take 1 tablet (5 mg total) by mouth 2 (two) times daily. 60 tablet O  ? aspirin EC 81 MG tablet Take 1 tablet (81 mg total) by mouth daily. 150 tablet 2  ? atorvastatin (LIPITOR) 80 MG tablet Take 1 tablet (80 mg total) by mouth daily at 6 PM. 30 tablet 0  ? Blood Glucose Monitoring Suppl (ACCU-CHEK GUIDE) w/Device KIT 5 (five) times daily.    ? HUMALOG KWIKPEN 100 UNIT/ML KwikPen Inject 5 Units into the skin 3 (three) times daily before meals. (Patient taking differently: Inject 10 Units into the skin 3 (three) times daily before meals.) 15 mL 11  ? losartan (COZAAR) 25 MG tablet Take 25 mg by mouth daily.    ? TOUJEO MAX SOLOSTAR 300 UNIT/ML Solostar Pen Inject 60 Units into the skin at bedtime.    ? TRUEPLUS 5-BEVEL PEN NEEDLES 31G X 6 MM MISC     ? TRULICITY 5.63 OV/5.6EP SOPN Inject 0.75 mg into the skin once a week.    ? ? ?No Known Allergies ? ?Past Surgical History:  ?Procedure Laterality Date  ? AMPUTATION Left 10/28/2018  ? Procedure: AMPUTATION BELOW KNEE;  Surgeon: Algernon Huxley, MD;  Location: [facility] ORS;  Service: Vascular;  Laterality: Left;  ? AMPUTATION Left 09/10/2021  ? Procedure: AMPUTATION DIGIT;  Surgeon: Cindra Presume, MD;  Location: [facility];  Service: Plastics;  Laterality: Left;  ? AMPUTATION Left 09/14/2021  ? Procedure: AMPUTATION DIGIT left long finger and irrigation and debridement;  Surgeon: Cindra Presume, MD;  Location: [facility];  Service: Plastics;  Laterality: Left;  ? AMPUTATION TOE Left 02/10/2018  ? Procedure: AMPUTATION TOE;  Surgeon: Sharlotte Alamo, DPM;  Location: [facility] ORS;  Service: Podiatry;  Laterality: Left;  ? APPLICATION OF WOUND VAC Left 10/16/2018  ? Procedure: APPLICATION OF WOUND VAC;  Surgeon: Samara Deist, DPM;  Location: [facility];  Service: Podiatry;   Laterality: Left;  ? DORSAL SLIT N/A 01/12/2021  ? Procedure: DORSAL SLIT;  Surgeon: Billey Co, MD;  Location: [facility] ORS;  Service: Urology;  Laterality: N/A;  ? groin surgery    ? IRRIGATION AND DEBRIDEMENT FOOT Left 09/30/2018  ? Procedure: IRRIGATION AND DEBRIDEMENT FOOT;  Surgeon: Sharlotte Alamo, DPM;  Location: [facility] OR[facility];  Service: Podiatry;  Laterality: Left;  ? LOWER EXTREMITY ANGIOGRAPHY Left 02/12/2018  ? Procedure: Lower Extremity Angiography;  Surgeon: Algernon Huxley, MD;  Location: [place] [facility];  Service: Cardiovascular;  Laterality: Left;  ? LOWER EXTREMITY ANGIOGRAPHY Left 10/01/2018  ? Procedure: Lower Extremity Angiography;  Surgeon: Algernon Huxley, MD;  Location: [place] [facility];  Service: Cardiovascular;  Laterality: Left;  ? LOWER EXTREMITY ANGIOGRAPHY Left 10/19/2018  ? Procedure: Lower Extremity Angiography;  Surgeon: Algernon Huxley, MD;  Location: [place] [facility];  Service: Cardiovascular;  Laterality: Left;  ? LOWER EXTREMITY ANGIOGRAPHY Left 10/20/2018  ? Procedure: LOWER EXTREMITY ANGIOGRAPHY;  Surgeon: Algernon Huxley, MD;  Location: [place] [facility];  Service: Cardiovascular;  Laterality: Left;  ? LOWER EXTREMITY ANGIOGRAPHY Right 11/25/2018  ? Procedure: LOWER EXTREMITY ANGIOGRAPHY;  Surgeon: Algernon Huxley, MD;  Location: [place] [facility];  Service: Cardiovascular;  Laterality: Right;  ? LOWER EXTREMITY ANGIOGRAPHY Right 01/10/2020  ? Procedure: LOWER EXTREMITY ANGIOGRAPHY;  Surgeon: Algernon Huxley, MD;  Location: [employer] [facility];  Service: Cardiovascular;  Laterality: Right;  ? LOWER EXTREMITY ANGIOGRAPHY Right 12/11/2020  ? Procedure: LOWER EXTREMITY ANGIOGRAPHY;  Surgeon: Algernon Huxley, MD;  Location: [place] [facility];  Service: Cardiovascular;  Laterality: Right;  ? LOWER EXTREMITY INTERVENTION  02/12/2018  ? Procedure: LOWER EXTREMITY INTERVENTION;  Surgeon: Algernon Huxley, MD;  Location: [place] [facility];  Service: Cardiovascular;;  ? MINOR IRRIGATION AND DEBRIDEMENT  OF WOUND Left 09/10/2021  ? Procedure: IRRIGATION AND DEBRIDEMENT OF LEFT LONG FINGER WOUND;  Surgeon: Cindra Presume, MD;  Location: [facility];  Service: Plastics;  Laterality: Left;  ? NECK SURGERY    ? TRANSMETATARSAL AMPUTATION Left 10/16/2018  ? Procedure: TRANSMETATARSAL AMPUTATION LEFT FOOT;  Surgeon: Samara Deist, DPM;  Location: [facility];  Service: Podiatry;  Laterality: Left;  ? ? ?Family History  ?Problem Relation Age of Onset  ? Diabetes Mother   ? Coronary artery disease Father   ? Hypertension Sister   ? Leukemia Brother   ? ? ?Social History  ? ?Socioeconomic History  ? Marital status: Divorced  ?  Spouse name: Not on file  ? Number of children: Not on file  ? Years of education: Not on file  ? Highest education level: Not on file  ?Occupational History  ? Not on file  ?Tobacco Use  ? Smoking status: Former  ?  Packs/day: 1.00  ?  Years: 47.00  ?  Pack years: 47.00  ?  Types: Cigarettes  ?  Quit date: 01/2021  ?  Years since quitting: 1.1  ? Smokeless tobacco: Never  ?Vaping Use  ? Vaping Use: Never used  ?Substance and Sexual Activity  ? Alcohol use: No  ? Drug use: No  ? Sexual activity: Not Currently  ?Other Topics Concern  ? Not on file  ?Social History Narrative  ? Not on file  ? ?Social Determinants of Health  ? ?Financial Resource Strain: Not on file  ?Food Insecurity: Not on file  ?Transportation Needs: Not on file  ?Physical Activity: Not on file  ?Stress: Not on file  ?Social Connections: Not on file  ?Intimate Partner Violence: Not on file  ? ? ? ?Objective:  ?Today's Vitals  ? 02/16/22 2340 02/17/22 0246 02/17/22 0327 02/17/22 0740  ?BP: 127/76  111/79 127/63  ?Pulse: 90  83 79  ?Resp: 19  20 (!) 24  ?Temp: 98.8 ?F (37.1 ?C)  97.8 ?F (36.6 ?C) 98.3 ?F (36.8 ?C)  ?TempSrc: Oral  Oral Oral  ?SpO2: 100%  98% 98%  ?Weight:  106.1 kg    ?PainSc: 0-No pain     ? ?Body mass index is 28.47 kg/m?.  ? ?General: Alert and oriented x3 in no acute distress ? ?Dermatology: Full-thickness ulceration  plantar fifth metatarsal head right foot with a fibrogranular base probes to bone the wound is approximately 1.5 cm x 2 cm probes to joint capsule.  There is surrounding soft tissue swelling, erythema, how

## 2022-02-17 NOTE — Consult Note (Signed)
?  [facility] for Infectious Disease  ? ? ?Date of Admission:  02/15/2022    ? ?Reason for Consult: Osteomyelitis ?    ?Referring Physician: Dr Reesa Chew ? ?Current antibiotics: ?Cefepime 5/13 - present ?Vancomycin 5/13 - present ? ?Previous antibiotics: ?Ceftriaxone x 1 dose 5/12 ?MTZ x 1 dose 5/13 ? ? ? ?ASSESSMENT:   ? ?66 y.o. male admitted with: ? ?Diabetic foot ulcer: With osteomyelitis of the right fifth metatarsal ?Uncontrolled diabetes: Hemoglobin A1c is 11.3 ?Peripheral arterial disease: Status post left BKA.  ABI in January 2023 notable for moderate right lower extremity arterial disease. ?Severe sepsis: Secondary to #1. ? ?RECOMMENDATIONS:   ? ?Continue current antibiotics given that he presented with fevers, leukocytosis, lactic acidosis ?Continue cefepime and vancomycin ?Agree with surgical consultation.  Patient remains at high risk for further limb loss ?Would also recommend vascular surgery evaluation to ensure that he is optimized from blood flow standpoint prior to any surgical intervention given his history of PAD ?Follow-up cultures ?Wound care ?Glycemic control ?Lab monitoring ?Check HIV, HCV ?Will follow ? ? ?Principal Problem: ?  Acute metabolic encephalopathy ?Active Problems: ?  Essential hypertension ?  Type 2 diabetes mellitus with diabetic peripheral angiopathy without gangrene ([place]) ?  Status post below-knee amputation (H[facility]) - left side ?  Diabetic foot ulcer associated with type 2 diabetes mellitus ([place]) ?  Sepsis with acute organ dysfunction without septic shock ([facility]) ?  Atrial fibrillation, chronic ([place]) ? ? ?MEDICATIONS:   ? ?Scheduled Meds: ? apixaban  5 mg Oral BID  ? aspirin EC  81 mg Oral Daily  ? atorvastatin  80 mg Oral q1800  ? insulin aspart  0-15 Units Subcutaneous TID WC  ? insulin aspart  2 Units Subcutaneous TID WC  ? insulin glargine-yfgn  30 Units Subcutaneous Daily  ? losartan  25 mg Oral Daily  ? sodium chloride flush  3 mL Intravenous Once  ? ?Continuous  Infusions: ? ceFEPime (MAXIPIME) IV 2 g (02/17/22 [id number])  ? vancomycin Stopped (02/17/22 [id number])  ? ?PRN Meds:.acetaminophen **OR** acetaminophen, guaiFENesin, hydrALAZINE, ipratropium-albuterol, metoprolol tartrate, ondansetron **OR** ondansetron (ZOFRAN) IV, oxyCODONE, senna-docusate, traZODone ? ?HPI:   ? ?Austin [name] is a 66 y.o. male with a past medical history of type 2 diabetes, hypertension, peripheral vascular disease, atrial fibrillation on anticoagulation, hypertension, status post left BKA who presented to the ER with initial concern for encephalopathy and left-sided weakness.  Around 2 PM yesterday, patient slid out from the recliner and was on the floor.  Family request that he be seen at the hospital but he declined at that time.  Around 4 PM he was found to be confused and not very responsive so they called EMS.  Upon EMS arrival he was noted to be very confused, not following commands, and hypotensive.  He was brought to the emergency department as a code stroke where he was evaluated by neurology.  Neurology recommended an MRI brain which was unremarkable for an acute process.  They recommended no further work-up at this time.  While in the emergency department patient was noted to be febrile with lactic acidosis and leukocytosis.  He also had a wound on the lateral aspect of his right foot.  He underwent MRI which showed osteomyelitis of the distal fifth metatarsal.  He was started on broad-spectrum antibiotics.  Blood cultures were obtained x1 which are currently no growth.  We have been consulted for further antibiotic recommendations. ? ?He reports today that he has a longstanding  history of this diabetic foot ulcer.  He has been following with podiatry in [place] where he undergoes frequent wound care and debridements. ? ? ?Past Medical History:  ?Diagnosis Date  ? Coronary artery disease   ? Diabetes ([place])   ? DVT (deep venous thrombosis) ([place])   ? Gangrene of left foot ([place]) 02/09/2018  ?  GERD (gastroesophageal reflux disease)   ? Hyperlipidemia   ? Hypertension   ? Peripheral vascular disease ([place])   ? ? ?Social History  ? ?Tobacco Use  ? Smoking status: Former  ?  Packs/day: 1.00  ?  Years: 47.00  ?  Pack years: 47.00  ?  Types: Cigarettes  ?  Quit date: 01/2021  ?  Years since quitting: 1.1  ? Smokeless tobacco: Never  ?Vaping Use  ? Vaping Use: Never used  ?Substance Use Topics  ? Alcohol use: No  ? Drug use: No  ? ? ?Family History  ?Problem Relation Age of Onset  ? Diabetes Mother   ? Coronary artery disease Father   ? Hypertension Sister   ? Leukemia Brother   ? ? ?No Known Allergies ? ?Review of Systems  ?All other systems reviewed and are negative. Except as noted above in the HPI.  ? ?OBJECTIVE:  ? ?Blood pressure 127/63, pulse 79, temperature 98.3 ?F (36.8 ?C), temperature source Oral, resp. rate (!) 24, weight 106.1 kg, SpO2 98 %. ?Body mass index is 28.47 kg/m?. ? ?Physical Exam ?Constitutional:   ?   Comments: Sitting up in recliner, eating breakfast, no distress.   ?HENT:  ?   Head: Normocephalic and atraumatic.  ?   Mouth/Throat:  ?   Comments: Dentition is poor.  ?Eyes:  ?   Extraocular Movements: Extraocular movements intact.  ?   Conjunctiva/sclera: Conjunctivae normal.  ?Cardiovascular:  ?   Rate and Rhythm: Normal rate and regular rhythm.  ?Pulmonary:  ?   Effort: Pulmonary effort is normal. No respiratory distress.  ?   Breath sounds: Normal breath sounds.  ?Abdominal:  ?   General: There is no distension.  ?   Palpations: Abdomen is soft.  ?   Tenderness: There is no abdominal tenderness.  ?   Hernia: No hernia is present.  ?Musculoskeletal:  ?   Comments: Left BKA. ?Right 5th metatarsal ulcer.  ?Skin: ?   General: Skin is warm and dry.  ?   Comments: Right lower extremity changes consistent with PAD.   ?Neurological:  ?   General: No focal deficit present.  ?   Mental Status: He is oriented to person, place, and time.  ?Psychiatric:     ?   Mood and Affect: Mood normal.     ?    Behavior: Behavior normal.  ? ? ? ? ? ? ?Lab Results: ?Lab Results  ?Component Value Date  ? WBC 8.5 02/17/2022  ? HGB 13.2 02/17/2022  ? HCT 38.2 (L) 02/17/2022  ? MCV 82.7 02/17/2022  ? PLT 127 (L) 02/17/2022  ?  ?Lab Results  ?Component Value Date  ? NA 135 02/17/2022  ? K 4.4 02/17/2022  ? CO2 21 (L) 02/17/2022  ? GLUCOSE 243 (H) 02/17/2022  ? BUN 10 02/17/2022  ? CREATININE 0.82 02/17/2022  ? CALCIUM 8.4 (L) 02/17/2022  ? GFRNONAA >60 02/17/2022  ? GFRAA >60 01/10/2020  ?  ?Lab Results  ?Component Value Date  ? ALT 16 02/16/2022  ? AST 16 02/16/2022  ? ALKPHOS 64 02/16/2022  ? BILITOT 1.2 02/16/2022  ? ? ?  No results found for: CRP ? ?   ?Component Value Date/Time  ? ESRSEDRATE 3 03/30/2021 0958  ? ESRSEDRATE 36 (H) 11/16/2013 0514  ? ? ?I have reviewed the micro and lab results in Epic. ? ?Imaging: ?MR BRAIN WO CONTRAST ? ?Result Date: 02/16/2022 ?CLINICAL DATA:  Initial evaluation for acute delirium. EXAM: MRI HEAD WITHOUT CONTRAST TECHNIQUE: Multiplanar, multiecho pulse sequences of the brain and surrounding structures were obtained without intravenous contrast. COMPARISON:  Prior CTs from 02/15/2022. FINDINGS: Brain: Generalized age-related cerebral atrophy. Patchy and confluent T2/FLAIR hyperintensity involving the periventricular and deep white matter both cerebral hemispheres as well as the deep gray nuclei and pons, most consistent with chronic microvascular ischemic disease, moderately advanced in nature. Small remote left cerebellar infarct noted. No evidence for acute or subacute ischemia. Gray-white matter differentiation maintained. Or is of chronic cortical infarction. No acute or chronic intracranial blood products. No mass lesion, mass effect or midline shift. No hydrocephalus or extra-axial fluid collection. Pituitary gland suprasellar region normal. Vascular: Major intracranial vascular flow voids are maintained. Skull and upper cervical spine: Craniocervical junction normal. Bone marrow signal  intensity normal. No scalp soft tissue abnormality. Sinuses/Orbits: Prior bilateral ocular lens replacement. Scattered mucosal thickening noted throughout the paranasal sinuses. Trace bilateral mastoid effusi

## 2022-02-17 NOTE — Consult Note (Signed)
WOC Nurse Consult Note: ?Reason for Consult: Right foot, lateral aspect full thickness wound. Patient has been seen today by podiatric medicine and vascular surgery today. See photos in EHR ?Wound type: Infectious, PAD ?Dressing procedure/placement/frequency: [facility] (Dr. Marylene Land) will be following. POC is for amputation of right fifth toe and distal metatarsal after patient has been optimized by VVS. Dr. Marylene Land has indicated in her notes that topical care can be with the silicone foam dressing currently in place, I have written orders for that intervention. ? ?WOC nursing team will not follow, but will remain available to this patient, the nursing and medical teams.  Please re-consult if needed. ?Thanks, ?Ladona Mow, MSN, RN, GNP, CWOCN, CWON-AP, FAAN  ?Pager# [phone]  ? ? ? ?  ?

## 2022-02-17 NOTE — Consult Note (Signed)
?Hospital Consult ? ? ? ?Reason for Consult: Right fifth toe osteomyelitis with known PAD ?Referring Physician: Hospitalist ?MRN #:  [id number] ? ?History of Present Illness: This is a 66 y.o. male with hx DVT on Eliquis, CAD, DM, HTN, HLD, PAD that vascular surgery has been consulted for osteomyelitis of the right fifth toe with open wound known underlying PAD.  Patient states he has had a wound on the right foot for some time.  He has had multiple left leg interventions at [facility] with Dr. Lucky Cowboy and Dr. Delana Meyer ultimately requiring left BKA.  Most recent ABI on 10/15/21 were 0.77 right dampened monophasic with TP 36.  He does walk with a prosthesis.  His last Eliquis dose was today. ? ?Past Medical History:  ?Diagnosis Date  ? Coronary artery disease   ? Diabetes ([place])   ? DVT (deep venous thrombosis) ([place])   ? Gangrene of left foot ([place]) 02/09/2018  ? GERD (gastroesophageal reflux disease)   ? Hyperlipidemia   ? Hypertension   ? Peripheral vascular disease ([place])   ? ? ?Past Surgical History:  ?Procedure Laterality Date  ? AMPUTATION Left 10/28/2018  ? Procedure: AMPUTATION BELOW KNEE;  Surgeon: Algernon Huxley, MD;  Location: [facility];  Service: Vascular;  Laterality: Left;  ? AMPUTATION Left 09/10/2021  ? Procedure: AMPUTATION DIGIT;  Surgeon: Cindra Presume, MD;  Location: [facility];  Service: Plastics;  Laterality: Left;  ? AMPUTATION Left 09/14/2021  ? Procedure: AMPUTATION DIGIT left long finger and irrigation and debridement;  Surgeon: Cindra Presume, MD;  Location: [facility];  Service: Plastics;  Laterality: Left;  ? AMPUTATION TOE Left 02/10/2018  ? Procedure: AMPUTATION TOE;  Surgeon: Sharlotte Alamo, DPM;  Location: [facility];  Service: Podiatry;  Laterality: Left;  ? APPLICATION OF WOUND VAC Left 10/16/2018  ? Procedure: APPLICATION OF WOUND VAC;  Surgeon: Samara Deist, DPM;  Location: [facility];  Service: Podiatry;  Laterality: Left;  ? DORSAL SLIT N/A 01/12/2021  ? Procedure: DORSAL SLIT;  Surgeon: Billey Co, MD;   Location: [facility];  Service: Urology;  Laterality: N/A;  ? groin surgery    ? IRRIGATION AND DEBRIDEMENT FOOT Left 09/30/2018  ? Procedure: IRRIGATION AND DEBRIDEMENT FOOT;  Surgeon: Sharlotte Alamo, DPM;  Location: [facility];  Service: Podiatry;  Laterality: Left;  ? LOWER EXTREMITY ANGIOGRAPHY Left 02/12/2018  ? Procedure: Lower Extremity Angiography;  Surgeon: Algernon Huxley, MD;  Location: [place] [facility];  Service: Cardiovascular;  Laterality: Left;  ? LOWER EXTREMITY ANGIOGRAPHY Left 10/01/2018  ? Procedure: Lower Extremity Angiography;  Surgeon: Algernon Huxley, MD;  Location: [place] [facility];  Service: Cardiovascular;  Laterality: Left;  ? LOWER EXTREMITY ANGIOGRAPHY Left 10/19/2018  ? Procedure: Lower Extremity Angiography;  Surgeon: Algernon Huxley, MD;  Location: [facility];  Service: Cardiovascular;  Laterality: Left;  ? LOWER EXTREMITY ANGIOGRAPHY Left 10/20/2018  ? Procedure: LOWER EXTREMITY ANGIOGRAPHY;  Surgeon: Algernon Huxley, MD;  Location: [facility];  Service: Cardiovascular;  Laterality: Left;  ? LOWER EXTREMITY ANGIOGRAPHY Right 11/25/2018  ? Procedure: LOWER EXTREMITY ANGIOGRAPHY;  Surgeon: Algernon Huxley, MD;  Location: [facility];  Service: Cardiovascular;  Laterality: Right;  ? LOWER EXTREMITY ANGIOGRAPHY Right 01/10/2020  ? Procedure: LOWER EXTREMITY ANGIOGRAPHY;  Surgeon: Algernon Huxley, MD;  Location: [facility];  Service: Cardiovascular;  Laterality: Right;  ? LOWER EXTREMITY ANGIOGRAPHY Right 12/11/2020  ? Procedure: LOWER EXTREMITY ANGIOGRAPHY;  Surgeon: Leotis Pain  S, MD;  Location: [facility] [facility];  Service: Cardiovascular;  Laterality: Right;  ? LOWER EXTREMITY INTERVENTION  02/12/2018  ? Procedure: LOWER EXTREMITY INTERVENTION;  Surgeon: Algernon Huxley, MD;  Location: [place] [facility];  Service: Cardiovascular;;  ? MINOR IRRIGATION AND DEBRIDEMENT OF WOUND Left 09/10/2021  ? Procedure: IRRIGATION AND DEBRIDEMENT OF LEFT LONG FINGER WOUND;  Surgeon: Cindra Presume, MD;  Location: [facility];  Service: Plastics;  Laterality: Left;  ? NECK SURGERY    ? TRANSMETATARSAL AMPUTATION Left 10/16/2018  ? Procedure: TRANSMETATARSAL AMPUTATION LEFT FOOT;  Surgeon: Samara Deist, DPM;  Location: [facility];  Service: Podiatry;  Laterality: Left;  ? ? ?No Known Allergies ? ?Prior to Admission medications   ?Medication Sig Start Date End Date Taking? Authorizing Provider  ?ACCU-CHEK GUIDE test strip 5 (five) times daily. 06/26/20  Yes [provider]  ?apixaban (ELIQUIS) 5 MG TABS tablet Take 1 tablet (5 mg total) by mouth 2 (two) times daily. 02/12/18  Yes Gouru, Illene Silver, MD  ?aspirin EC 81 MG tablet Take 1 tablet (81 mg total) by mouth daily. 01/10/20  Yes Algernon Huxley, MD  ?atorvastatin (LIPITOR) 80 MG tablet Take 1 tablet (80 mg total) by mouth daily at 6 PM. 02/12/18  Yes Gouru, Aruna, MD  ?Blood Glucose Monitoring Suppl (ACCU-CHEK GUIDE) w/Device KIT 5 (five) times daily. 10/27/20  Yes [provider]  ?Cleda Clarks 100 UNIT/ML KwikPen Inject 5 Units into the skin 3 (three) times daily before meals. ?Patient taking differently: Inject 10 Units into the skin 3 (three) times daily before meals. 09/19/21  Yes Dessa Phi, DO  ?losartan (COZAAR) 25 MG tablet Take 25 mg by mouth daily. 11/13/20  Yes [provider]  ?Lenon Curt SOLOSTAR 300 UNIT/ML Solostar Pen Inject 60 Units into the skin at bedtime. 02/05/22  Yes [provider]  ?TRUEPLUS 5-BEVEL PEN NEEDLES 31G X 6 MM [place]  10/25/20  Yes [provider]  ?TRULICITY 3.22 GU/5.4YH SOPN Inject 0.75 mg into the skin once a week. 02/05/22   [provider]  ? ? ?Social History  ? ?Socioeconomic History  ? Marital status: Divorced  ?  Spouse name: Not on file  ? Number of children: Not on file  ? Years of education: Not on file  ? Highest education level: Not on file  ?Occupational History  ? Not on file  ?Tobacco Use  ? Smoking status: Former  ?  Packs/day: 1.00  ?  Years: 47.00  ?  Pack  years: 47.00  ?  Types: Cigarettes  ?  Quit date: 01/2021  ?  Years since quitting: 1.1  ? Smokeless tobacco: Never  ?Vaping Use  ? Vaping Use: Never used  ?Substance and Sexual Activity  ? Alcohol use: No  ? Drug use: No  ? Sexual activity: Not Currently  ?Other Topics Concern  ? Not on file  ?Social History Narrative  ? Not on file  ? ?Social Determinants of Health  ? ?Financial Resource Strain: Not on file  ?Food Insecurity: Not on file  ?Transportation Needs: Not on file  ?Physical Activity: Not on file  ?Stress: Not on file  ?Social Connections: Not on file  ?Intimate Partner Violence: Not on file  ? ? ? ?Family History  ?Problem Relation Age of Onset  ? Diabetes Mother   ? Coronary artery disease Father   ? Hypertension Sister   ? Leukemia Brother   ? ? ?ROS: '[x]'  Positive   '[ ]'   Negative   '[ ]'  All sytems reviewed and are negative ? ?Cardiovascular: ?'[]'  chest pain/pressure ?'[]'  palpitations ?'[]'  SOB lying flat ?'[]'  DOE ?'[]'  pain in legs while walking ?'[]'  pain in legs at rest ?'[]'  pain in legs at night ?'[]'  non-healing ulcers ?'[]'  hx of DVT ?'[]'  swelling in legs ? ?Pulmonary: ?'[]'  productive cough ?'[]'  asthma/wheezing ?'[]'  home O2 ? ?Neurologic: ?'[]'  weakness in '[]'  arms '[]'  legs ?'[]'  numbness in '[]'  arms '[]'  legs ?'[]'  hx of CVA '[]'  mini stroke ?'[]' difficulty speaking or slurred speech ?'[]'  temporary loss of vision in one eye ?'[]'  dizziness ? ?Hematologic: ?'[]'  hx of cancer ?'[]'  bleeding problems ?'[]'  problems with blood clotting easily ? ?Endocrine:  ? '[]'  diabetes '[]'  thyroid disease ? ?GI ?'[]'  vomiting blood ?'[]'  blood in stool ? ?GU: ?'[]'  CKD/renal failure '[]'  HD--'[]'  M/W/F or '[]'  T/T/S ?'[]'  burning with urination ?'[]'  blood in urine ? ?Psychiatric: ?'[]'  anxiety ?'[]'  depression ? ?Musculoskeletal: ?'[]'  arthritis ?'[]'  joint pain ? ?Integumentary: ?'[]'  rashes '[]'  ulcers ? ?Constitutional: ?'[]'  fever '[]'  chills ? ? ?Physical Examination ? ?Vitals:  ? 02/17/22 0327 02/17/22 0740  ?BP: 111/79 127/63  ?Pulse: 83 79  ?Resp: 20 (!) 24  ?Temp: 97.8 ?F (36.6  ?C) 98.3 ?F (36.8 ?C)  ?SpO2: 98% 98%  ? ?Body mass index is 28.47 kg/m?. ? ?General:  NAD ?Gait: Not observed ?HENT: WNL, normocephalic ?Pulmonary: normal non-labored breathing ?Cardiac: regular, without  Mu

## 2022-02-18 ENCOUNTER — Encounter (HOSPITAL_COMMUNITY): Payer: Medicaid Other

## 2022-02-18 ENCOUNTER — Ambulatory Visit (INDEPENDENT_AMBULATORY_CARE_PROVIDER_SITE_OTHER): Payer: 59 | Admitting: Nurse Practitioner

## 2022-02-18 DIAGNOSIS — E11621 Type 2 diabetes mellitus with foot ulcer: Secondary | ICD-10-CM | POA: Diagnosis not present

## 2022-02-18 DIAGNOSIS — I482 Chronic atrial fibrillation, unspecified: Secondary | ICD-10-CM | POA: Diagnosis not present

## 2022-02-18 DIAGNOSIS — L03115 Cellulitis of right lower limb: Secondary | ICD-10-CM | POA: Diagnosis not present

## 2022-02-18 DIAGNOSIS — I10 Essential (primary) hypertension: Secondary | ICD-10-CM | POA: Diagnosis not present

## 2022-02-18 DIAGNOSIS — G9341 Metabolic encephalopathy: Secondary | ICD-10-CM | POA: Diagnosis not present

## 2022-02-18 LAB — CBC
HCT: 38 % — ABNORMAL LOW (ref 39.0–52.0)
Hemoglobin: 12.6 g/dL — ABNORMAL LOW (ref 13.0–17.0)
MCH: 28 pg (ref 26.0–34.0)
MCHC: 33.2 g/dL (ref 30.0–36.0)
MCV: 84.4 fL (ref 80.0–100.0)
Platelets: 159 10*3/uL (ref 150–400)
RBC: 4.5 MIL/uL (ref 4.22–5.81)
RDW: 15.1 % (ref 11.5–15.5)
WBC: 6.5 10*3/uL (ref 4.0–10.5)
nRBC: 0 % (ref 0.0–0.2)

## 2022-02-18 LAB — BASIC METABOLIC PANEL
Anion gap: 8 (ref 5–15)
BUN: 14 mg/dL (ref 8–23)
CO2: 21 mmol/L — ABNORMAL LOW (ref 22–32)
Calcium: 8.6 mg/dL — ABNORMAL LOW (ref 8.9–10.3)
Chloride: 106 mmol/L (ref 98–111)
Creatinine, Ser: 0.94 mg/dL (ref 0.61–1.24)
GFR, Estimated: >60 mL/min (ref >60)
Glucose, Bld: 366 mg/dL — ABNORMAL HIGH (ref 70–99)
Potassium: 4.3 mmol/L (ref 3.5–5.1)
Sodium: 135 mmol/L (ref 135–145)

## 2022-02-18 LAB — GLUCOSE, CAPILLARY
Glucose-Capillary: 339 mg/dL — ABNORMAL HIGH (ref 70–99)
Glucose-Capillary: 272 mg/dL — ABNORMAL HIGH (ref 70–99)
Glucose-Capillary: 360 mg/dL — ABNORMAL HIGH (ref 70–99)
Glucose-Capillary: 269 mg/dL — ABNORMAL HIGH (ref 70–99)

## 2022-02-18 LAB — MAGNESIUM: Magnesium: 1.7 mg/dL (ref 1.7–2.4)

## 2022-02-18 MED ORDER — LIVING WELL WITH DIABETES BOOK
Freq: Once | Status: AC
  Filled 2022-02-18: qty 1

## 2022-02-18 MED ORDER — INSULIN GLARGINE-YFGN 100 UNIT/ML ~~LOC~~ SOLN
40.0000 [IU] | Freq: Every day | SUBCUTANEOUS | Status: DC
  Administered 2022-02-18 – 2022-02-19 (×2): 40 [IU] via SUBCUTANEOUS
  Filled 2022-02-18 (×4): qty 0.4

## 2022-02-18 MED ORDER — INSULIN ASPART 100 UNIT/ML IJ SOLN
5.0000 [IU] | Freq: Three times a day (TID) | INTRAMUSCULAR | Status: DC
  Administered 2022-02-18 – 2022-02-28 (×24): 5 [IU] via SUBCUTANEOUS

## 2022-02-18 NOTE — Progress Notes (Signed)
?PROGRESS NOTE ? ? ? ?Austin [name]  [id number] DOB: 02/13/1956 DOA: 02/15/2022 ?PCP: Theotis Burrow, MD  ? ?Brief Narrative:  ?66 year old with history of DM2, HTN, PVD, A-fib on Eliquis, HTN, status post left BKA came to the ED after falling off the couch due to weakness and change in mental status.  Upon admission patient was noted to be in sepsis secondary to likely diabetic foot ulcer.  Upon admission patient was seen by neurology team, MRI brain was negative.  MRI was positive for right foot fifth digit osteomyelitis.  Podiatry, infectious disease and vascular team were consulted. ? ? ?Assessment & Plan: ? Principal Problem: ?  Diabetic foot ulcer associated with type 2 diabetes mellitus ([place]) ?Active Problems: ?  Acute metabolic encephalopathy ?  Sepsis with acute organ dysfunction without septic shock (HCC) ?  Essential hypertension ?  Type 2 diabetes mellitus with diabetic peripheral angiopathy without gangrene ([place]) ?  Status post below-knee amputation (HCC) - left side ?  Atrial fibrillation, chronic ([place]) ?  ? ? ?Assessment and Plan: ? ? 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  Concerns of osteomyelitis ?- IV vancomycin and cefepime ?- MRI foot shows digital fist metatarsal osteomyelitis with surrounding muscular edema ?-Podiatry, VVS and infectious disease has been consulted.  Appreciate input ?-Plans for angiogram later this week per vascular ?-12/11/20 Patient had PCI and balloon angioplasty of right lower extremity, stent placement of left iliac.  ? ?Diabetic foot ulcer associated with type 2 diabetes mellitus ([place]) ?Type 2 diabetes mellitus with diabetic peripheral angiopathy without gangrene ([place]) ?- Hemoglobin A1c 11.3 on outpatient Toujeo and Trulicity?  Versus Levemir? ?- Sliding scale and Accu-Cheks ?-Diabetic coordinator ?-Increase Semglee 40 units at  bedtime ?-NovoLog 5 units premeals ? ?* Acute metabolic encephalopathy ?This is resolved.  MRI brain showed remote infarct.  Seen by neurology team.  TSH and ammonia is normal ? ?Atrial fibrillation, chronic ([place]) ?Currently Eliquis is on hold in anticipation for vascular intervention later in the week. ? ?Status post below-knee amputation (HCC) - left side ?Chronic. Stable. ? ?Essential hypertension ?Continue cozaar ? ?DVT prophylaxis: Eliquis ?Code Status: Full code ?Family Communication:   ? ?Status is: Inpatient ?Remains inpatient appropriate because: Maintain hospital stay for IV antibiotic.  Podiatry, vascular, ID following.  Will at least in the hospital for next 3-4 days ? ? ? ?Subjective: ?RLE pain is better. Afebrile overnight.  ? ?Examination: ? ?General exam: Appears calm and comfortable  ?Respiratory system: Clear to auscultation. Respiratory effort normal. ?Cardiovascular system: S1 & S2 heard, RRR. No JVD, murmurs, rubs, gallops or clicks. No pedal edema. ?Gastrointestinal system: Abdomen is nondistended, soft and nontender. No organomegaly or masses felt. Normal bowel sounds heard. ?Central nervous system: Alert and oriented. No focal neurological deficits. ?Extremities: Left-sided BKA ?Skin: Right lower foot diabetic foot ulcer noted with surrounding erythema and warmth extending up to his shins. ?Psychiatry: Judgement and insight appear normal. Mood & affect appropriate.  ? ? ? ?Objective: ?Vitals:  ? 02/17/22 1636 02/17/22 1959 02/17/22 2327 02/18/22 0400  ?BP: (!) 95/59 137/64 (!) 129/107 132/61  ?Pulse: 79 85 79 83  ?Resp: 17 19 20 15   ?Temp: 98.3 ?F (36.8 ?C) 98.2 ?F (36.8 ?C)  98.1 ?F (36.7 ?C)  ?TempSrc: Oral Oral  Oral  ?SpO2: 99% 96% 98% 98%  ?Weight:    104.5 kg  ? ? ?Intake/Output Summary (Last 24 hours)  at 02/18/2022 0739 ?Last data filed at 02/18/2022 0443 ?Gross per 24 hour  ?Intake 1693.77 ml  ?Output 3100 ml  ?Net -1406.23 ml  ? ?Filed Weights  ? 02/16/22 2339 02/17/22 0246 02/18/22  0400  ?Weight: 106.1 kg 106.1 kg 104.5 kg  ? ? ? ?Data Reviewed:  ? ?CBC: ?Recent Labs  ?Lab 02/15/22 ?2051 02/15/22 ?2055 02/16/22 ?0530 02/17/22 ?0052 02/18/22 ?0146  ?WBC 16.9*  --  13.6* 8.5 6.5  ?NEUTROABS 13.7*  --  10.1*  --   --   ?HGB 14.4 15.0 14.5 13.2 12.6*  ?HCT 43.6 44.0 43.5 38.2* 38.0*  ?MCV 84.0  --  83.7 82.7 84.4  ?PLT 164  --  167 127* 159  ? ?Basic Metabolic Panel: ?Recent Labs  ?Lab 02/15/22 ?2051 02/15/22 ?2055 02/16/22 ?0530 02/17/22 ?0052 02/18/22 ?0146  ?NA 136 136 136 135 135  ?K 3.8 3.8 3.8 4.4 4.3  ?CL 103 101 104 104 106  ?CO2 23  --  27 21* 21*  ?GLUCOSE 302* 296* 243* 243* 366*  ?BUN 14 14 10 10 14   ?CREATININE 1.24 1.00 1.04 0.82 0.94  ?CALCIUM 9.1  --  8.8* 8.4* 8.6*  ?MG  --   --  1.5* 1.8 1.7  ? ?GFR: ?Estimated Creatinine Clearance: 102.7 mL/min (by C-G formula based on SCr of 0.94 mg/dL). ?Liver Function Tests: ?Recent Labs  ?Lab 02/15/22 ?2051 02/16/22 ?0530  ?AST 19 16  ?ALT 18 16  ?ALKPHOS 63 64  ?BILITOT 0.9 1.2  ?PROT 7.0 6.9  ?ALBUMIN 3.1* 2.7*  ? ?No results for input(s): LIPASE, AMYLASE in the last 168 hours. ?Recent Labs  ?Lab 02/16/22 ?0530  ?AMMONIA 13  ? ?Coagulation Profile: ?Recent Labs  ?Lab 02/15/22 ?2051  ?INR 1.3*  ? ?Cardiac Enzymes: ?No results for input(s): CKTOTAL, CKMB, CKMBINDEX, TROPONINI in the last 168 hours. ?BNP (last 3 results) ?No results for input(s): PROBNP in the last 8760 hours. ?HbA1C: ?Recent Labs  ?  02/17/22 ?0052  ?HGBA1C 11.3*  ? ?CBG: ?Recent Labs  ?Lab 02/16/22 ?2253 02/17/22 ?0744 02/17/22 ?1235 02/17/22 ?1639 02/17/22 ?2226  ?GLUCAP 215* 235* 269* 296* 299*  ? ?Lipid Profile: ?No results for input(s): CHOL, HDL, LDLCALC, TRIG, CHOLHDL, LDLDIRECT in the last 72 hours. ?Thyroid Function Tests: ?Recent Labs  ?  02/16/22 ?0530  ?TSH 0.831  ? ?Anemia Panel: ?Recent Labs  ?  02/16/22 ?0530  ?VITAMINB12 746  ? ?Sepsis Labs: ?Recent Labs  ?Lab 02/15/22 ?2130 02/16/22 ?0239 02/16/22 ?0530 02/17/22 ?0052  ?PROCALCITON  --   --  0.34 0.19   ?LATICACIDVEN 2.0* 2.6*  --  1.5  ? ? ?Recent Results (from the past 240 hour(s))  ?Culture, blood (single)     Status: None (Preliminary result)  ? Collection Time: 02/15/22  9:27 PM  ? Specimen: BLOOD  ?Result Value Ref Range Status  ? Specimen Description BLOOD LEFT ANTECUBITAL  Final  ? Special Requests   Final  ?  BOTTLES DRAWN AEROBIC AND ANAEROBIC Blood Culture results may not be optimal due to an excessive volume of blood received in culture bottles  ? Culture   Final  ?  NO GROWTH 2 DAYS ?Performed at [facility], [place] [address], [place], [place] [address] ?  ? Report Status PENDING  Incomplete  ?  ? ? ? ? ? ?Radiology Studies: ?MR BRAIN WO CONTRAST ? ?Result Date: 02/16/2022 ?CLINICAL DATA:  Initial evaluation for acute delirium. EXAM: MRI HEAD WITHOUT CONTRAST TECHNIQUE: Multiplanar, multiecho pulse sequences of  the brain and surrounding structures were obtained without intravenous contrast. COMPARISON:  Prior CTs from 02/15/2022. FINDINGS: Brain: Generalized age-related cerebral atrophy. Patchy and confluent T2/FLAIR hyperintensity involving the periventricular and deep white matter both cerebral hemispheres as well as the deep gray nuclei and pons, most consistent with chronic microvascular ischemic disease, moderately advanced in nature. Small remote left cerebellar infarct noted. No evidence for acute or subacute ischemia. Gray-white matter differentiation maintained. Or is of chronic cortical infarction. No acute or chronic intracranial blood products. No mass lesion, mass effect or midline shift. No hydrocephalus or extra-axial fluid collection. Pituitary gland suprasellar region normal. Vascular: Major intracranial vascular flow voids are maintained. Skull and upper cervical spine: Craniocervical junction normal. Bone marrow signal intensity normal. No scalp soft tissue abnormality. Sinuses/Orbits: Prior bilateral ocular lens replacement. Scattered mucosal thickening noted throughout the  paranasal sinuses. Trace bilateral mastoid effusions, of doubtful significance. Other: None. IMPRESSION: 1. No acute intracranial abnormality. 2. Age-related cerebral atrophy with moderately advanced chronic mi

## 2022-02-18 NOTE — Progress Notes (Signed)
Pharmacy Antibiotic Note ? ?Austin [name] is a 66 y.o. male admitted on 02/15/2022 with cellulitis.  Pharmacy has been consulted for vancomycin and cefepime dosing. Blood cultures negative so far. SCr stable at 0.94 today. Will f/u ID recommendations. ? ?Plan: ?Continue vancomycin 1500 mg IV q12h (eAUC 478.1, SCr 1, goal AUC 400-550) ?Continue cefepime 2 grams IV every 8 hours ?Follow-up clinical status, renal function ?Follow-up cultures, LOT, de-escalate as able ?Obtain vancomycin levels if staying on vancomycin ? ?Height: 6\' 4"  (193 cm) ?Weight: 104.5 kg (230 lb 4.8 oz) ?IBW/kg (Calculated): 59.2 kg  ? ?Temp (24hrs), Avg:98.1 ?F (36.7 ?C), Min:97.8 ?F (36.6 ?C), Max:98.3 ?F (36.8 ?C) ? ?Recent Labs  ?Lab 02/15/22 ?2051 02/15/22 ?2055 02/15/22 ?2130 02/16/22 ?0239 02/16/22 ?0530 02/17/22 ?0052 02/18/22 ?0146  ?WBC 16.9*  --   --   --  13.6* 8.5 6.5  ?CREATININE 1.24 1.00  --   --  1.04 0.82 0.94  ?LATICACIDVEN  --   --  2.0* 2.6*  --  1.5  --   ? ?  ?Estimated Creatinine Clearance: 102.7 mL/min (by C-G formula based on SCr of 0.94 mg/dL).   ? ?No Known Allergies ? ?Antimicrobials this admission: ?Ceftriaxone 5/12 x 1 ?Vancomycin 5/12 >>  ?Cefepime 5/13 >> ? ?Microbiology results: ?5/12 BCx: ng x 3d ? ? ?Thank you for allowing 7/12 to participate in this patients care. ?[place], PharmD ?02/18/2022 1:29 PM ? ?**Pharmacist phone directory can be found on [email or web address] listed under [facility]** ? ? ? ?

## 2022-02-18 NOTE — Progress Notes (Signed)
[facility] [place] ? ?Subjective  -no complaints ? ? ?Objective ?126/71 ?66 ?97.9 ?F (36.6 ?C) (Oral) ?13 ?96% ? ?Intake/Output Summary (Last 24 hours) at 02/18/2022 1413 ?Last data filed at 02/18/2022 1315 ?Gross per 24 hour  ?Intake 1213.77 ml  ?Output 4050 ml  ?Net -2836.23 ml  ? ?Bilateral femoral pulses palpable ?Left BKA ?Right foot plantar surface wound ? ?Laboratory ?Lab Results: ?Recent Labs  ?  02/17/22 ?0052 02/18/22 ?[id number]  ?WBC 8.5 6.5  ?HGB 13.2 12.6*  ?HCT 38.2* 38.0*  ?PLT 127* 159  ? ?BMET ?Recent Labs  ?  02/17/22 ?0052 02/18/22 ?0146  ?NA 135 135  ?K 4.4 4.3  ?CL 104 106  ?CO2 21* 21*  ?GLUCOSE 243* 366*  ?BUN 10 14  ?CREATININE 0.82 0.94  ?CALCIUM 8.4* 8.6*  ? ? ?COAG ?Lab Results  ?Component Value Date  ? INR 1.3 (H) 02/15/2022  ? INR 1.2 03/31/2021  ? INR 1.10 10/28/2018  ? ?No results found for: PTT ? ?Assessment/Planning: ? ?Discussed plan for aortogram with lower extremity arteriogram with possible intervention with a focus on the right leg on Wednesday with me in the Cath Lab.  Discussed with hospitalist holding his Eliquis yesterday.  ABI from January 2023 was 0.77 and dampended monophasic on the right with TP 36.  Discussed this is inaderquate for wound healing. ? ?Marty Heck ?02/18/2022 ?2:13 PM ?-- ? ? ?

## 2022-02-18 NOTE — Progress Notes (Signed)
Inpatient Diabetes Program Recommendations ? ?AACE/ADA: New Consensus Statement on Inpatient Glycemic Control (2015) ? ?Target Ranges:  Prepandial:   less than 140 mg/dL ?     Peak postprandial:   less than 180 mg/dL (1-2 hours) ?     Critically ill patients:  140 - 180 mg/dL  ? ?Lab Results  ?Component Value Date  ? GLUCAP 272 (H) 02/18/2022  ? HGBA1C 11.3 (H) 02/17/2022  ? ? ?Review of Glycemic Control ? ?Diabetes history: DM2 ?Outpatient Diabetes medications: Toujeo 60 units QHS, Humalog 10 units TID, Trulicity 0.75 mg Weekly ?Current orders for Inpatient glycemic control: Semgle 40 units QD, Novolog 0-15 units TID and 0-5 QHS, Novolog 5 units TID ? ?Spoke with patient at bedside.  Reviewed patient's current A1c of 11.3% (average blood glucose of 278 mg/dL). Explained what a A1c is and what it measures. Also reviewed goal A1c with patient, importance of good glucose control @ home, and blood sugar goals. ? ?He states he checks his blood sugar at home TID.  His blood sugar runs 200-300 mg/dl.  He states he drinks one glass of milk with BF and uses milk in his cereal.  He states he does not eat bread or sweets.  He is current with his PCP.  He sees Surveyor, minerals with [facility] for nutrition every 2-3 weeks.  He was prescribed Trulicity and a [employer] 1 week ago and has not started either yet.  He plans to start once he gets home.   ? ?Educated on The Plate Method, CHO's, portion control, CBGs at home fasting and mid afternoon, F/U with PCP every 3 months, bring meter to PCP office, long and short term complications of uncontrolled BG. ? ?LWWD booklet is at bedside.  Plan for surgery on Wednesday per patient.   ? ?Will continue to follow while inpatient. ? ?Thank you, ?Dulce Sellar, MSN, CDCES ?Diabetes Coordinator ?Inpatient Diabetes Program ?[phone] (team pager from 8a-5p) ? ? ? ? ? ? ? ? ?

## 2022-02-19 ENCOUNTER — Encounter (HOSPITAL_COMMUNITY): Payer: Medicaid Other

## 2022-02-19 DIAGNOSIS — I10 Essential (primary) hypertension: Secondary | ICD-10-CM | POA: Diagnosis not present

## 2022-02-19 DIAGNOSIS — G9341 Metabolic encephalopathy: Secondary | ICD-10-CM | POA: Diagnosis not present

## 2022-02-19 DIAGNOSIS — I482 Chronic atrial fibrillation, unspecified: Secondary | ICD-10-CM | POA: Diagnosis not present

## 2022-02-19 DIAGNOSIS — E1169 Type 2 diabetes mellitus with other specified complication: Secondary | ICD-10-CM | POA: Diagnosis not present

## 2022-02-19 DIAGNOSIS — E11621 Type 2 diabetes mellitus with foot ulcer: Secondary | ICD-10-CM | POA: Diagnosis not present

## 2022-02-19 DIAGNOSIS — M86171 Other acute osteomyelitis, right ankle and foot: Secondary | ICD-10-CM | POA: Diagnosis not present

## 2022-02-19 DIAGNOSIS — L97513 Non-pressure chronic ulcer of other part of right foot with necrosis of muscle: Secondary | ICD-10-CM | POA: Diagnosis not present

## 2022-02-19 LAB — CBC
HCT: 38.2 % — ABNORMAL LOW (ref 39.0–52.0)
Hemoglobin: 13 g/dL (ref 13.0–17.0)
MCH: 28.3 pg (ref 26.0–34.0)
MCHC: 34 g/dL (ref 30.0–36.0)
MCV: 83.2 fL (ref 80.0–100.0)
Platelets: 181 10*3/uL (ref 150–400)
RBC: 4.59 MIL/uL (ref 4.22–5.81)
RDW: 14.8 % (ref 11.5–15.5)
WBC: 7.6 10*3/uL (ref 4.0–10.5)
nRBC: 0 % (ref 0.0–0.2)

## 2022-02-19 LAB — BASIC METABOLIC PANEL
Anion gap: 8 (ref 5–15)
BUN: 13 mg/dL (ref 8–23)
CO2: 19 mmol/L — ABNORMAL LOW (ref 22–32)
Calcium: 8.8 mg/dL — ABNORMAL LOW (ref 8.9–10.3)
Chloride: 109 mmol/L (ref 98–111)
Creatinine, Ser: 0.83 mg/dL (ref 0.61–1.24)
GFR, Estimated: >60 mL/min (ref >60)
Glucose, Bld: 260 mg/dL — ABNORMAL HIGH (ref 70–99)
Potassium: 4 mmol/L (ref 3.5–5.1)
Sodium: 136 mmol/L (ref 135–145)

## 2022-02-19 LAB — FOLATE RBC
Folate, Hemolysate: 328 ng/mL
Folate, RBC: 800 ng/mL (ref >498)
Hematocrit: 41 % (ref 37.5–51.0)

## 2022-02-19 LAB — GLUCOSE, CAPILLARY
Glucose-Capillary: 237 mg/dL — ABNORMAL HIGH (ref 70–99)
Glucose-Capillary: 287 mg/dL — ABNORMAL HIGH (ref 70–99)
Glucose-Capillary: 315 mg/dL — ABNORMAL HIGH (ref 70–99)
Glucose-Capillary: 227 mg/dL — ABNORMAL HIGH (ref 70–99)
Glucose-Capillary: 317 mg/dL — ABNORMAL HIGH (ref 70–99)

## 2022-02-19 LAB — MAGNESIUM: Magnesium: 1.7 mg/dL (ref 1.7–2.4)

## 2022-02-19 LAB — TROPONIN I (HIGH SENSITIVITY): Troponin I (High Sensitivity): 4 ng/L (ref <18)

## 2022-02-19 LAB — MRSA NEXT GEN BY PCR, NASAL: MRSA by PCR Next Gen: NOT DETECTED

## 2022-02-19 MED ORDER — MAGNESIUM OXIDE -MG SUPPLEMENT 400 (240 MG) MG PO TABS
800.0000 mg | ORAL_TABLET | Freq: Once | ORAL | Status: AC
  Administered 2022-02-19: 800 mg via ORAL
  Filled 2022-02-19: qty 2

## 2022-02-19 MED ORDER — INSULIN ASPART 100 UNIT/ML IJ SOLN
0.0000 [IU] | Freq: Every day | INTRAMUSCULAR | Status: DC
  Administered 2022-02-19: 4 [IU] via SUBCUTANEOUS
  Administered 2022-02-20: 5 [IU] via SUBCUTANEOUS
  Administered 2022-02-21: 3 [IU] via SUBCUTANEOUS
  Administered 2022-02-22: 2 [IU] via SUBCUTANEOUS
  Administered 2022-02-24 – 2022-03-01 (×4): 3 [IU] via SUBCUTANEOUS
  Administered 2022-03-02 – 2022-03-06 (×5): 2 [IU] via SUBCUTANEOUS

## 2022-02-19 MED ORDER — POTASSIUM CHLORIDE CRYS ER 20 MEQ PO TBCR
40.0000 meq | EXTENDED_RELEASE_TABLET | Freq: Once | ORAL | Status: DC
Start: 2022-02-19 — End: 2022-02-19

## 2022-02-19 MED ORDER — INSULIN ASPART 100 UNIT/ML IJ SOLN
0.0000 [IU] | Freq: Three times a day (TID) | INTRAMUSCULAR | Status: DC
  Administered 2022-02-19: 8 [IU] via SUBCUTANEOUS
  Administered 2022-02-19: 5 [IU] via SUBCUTANEOUS
  Administered 2022-02-19 – 2022-02-20 (×2): 11 [IU] via SUBCUTANEOUS
  Administered 2022-02-21: 5 [IU] via SUBCUTANEOUS
  Administered 2022-02-21: 8 [IU] via SUBCUTANEOUS
  Administered 2022-02-21: 11 [IU] via SUBCUTANEOUS
  Administered 2022-02-22: 8 [IU] via SUBCUTANEOUS
  Administered 2022-02-22: 3 [IU] via SUBCUTANEOUS
  Administered 2022-02-22: 11 [IU] via SUBCUTANEOUS
  Administered 2022-02-23: 3 [IU] via SUBCUTANEOUS
  Administered 2022-02-23 – 2022-02-24 (×4): 5 [IU] via SUBCUTANEOUS
  Administered 2022-02-25 (×2): 3 [IU] via SUBCUTANEOUS
  Administered 2022-02-26 (×3): 5 [IU] via SUBCUTANEOUS
  Administered 2022-02-27 (×2): 3 [IU] via SUBCUTANEOUS
  Administered 2022-02-28: 11 [IU] via SUBCUTANEOUS
  Administered 2022-02-28: 8 [IU] via SUBCUTANEOUS
  Administered 2022-02-28: 11 [IU] via SUBCUTANEOUS
  Administered 2022-03-01: 5 [IU] via SUBCUTANEOUS
  Administered 2022-03-01 (×2): 8 [IU] via SUBCUTANEOUS
  Administered 2022-03-02: 5 [IU] via SUBCUTANEOUS
  Administered 2022-03-02: 8 [IU] via SUBCUTANEOUS
  Administered 2022-03-02 – 2022-03-03 (×2): 5 [IU] via SUBCUTANEOUS
  Administered 2022-03-03: 2 [IU] via SUBCUTANEOUS
  Administered 2022-03-03: 5 [IU] via SUBCUTANEOUS
  Administered 2022-03-04: 2 [IU] via SUBCUTANEOUS
  Administered 2022-03-04 – 2022-03-05 (×3): 5 [IU] via SUBCUTANEOUS
  Administered 2022-03-05 – 2022-03-06 (×3): 3 [IU] via SUBCUTANEOUS
  Administered 2022-03-06: 15 [IU] via SUBCUTANEOUS
  Administered 2022-03-06: 5 [IU] via SUBCUTANEOUS
  Administered 2022-03-07: 2 [IU] via SUBCUTANEOUS
  Administered 2022-03-07 (×2): 3 [IU] via SUBCUTANEOUS
  Administered 2022-03-08: 2 [IU] via SUBCUTANEOUS
  Administered 2022-03-08: 8 [IU] via SUBCUTANEOUS

## 2022-02-19 NOTE — Progress Notes (Signed)
EKG COMPLETED AND LAB CALLED TO COLLECT STAT LABS ORDERED AND BLOOD GLUCOSE ACCUCHECK 237 ?

## 2022-02-19 NOTE — Progress Notes (Addendum)
?  Progress Note ? ? ? ?02/19/2022 ?7:37 AM ?Hospital Day 3 ? ?Subjective:  no complaints; ready to proceed tomorrow ? ? ?Vitals:  ? 02/19/22 0402 02/19/22 0500  ?BP: 107/80   ?Pulse: 78   ?Resp: 19 19  ?Temp: 97.8 ?F (36.6 ?C)   ?SpO2:    ? ? ?Physical Exam: ?General:  no distress ?Lungs:  non labored ? ? ?CBC ?   ?Component Value Date/Time  ? WBC 7.6 02/19/2022 0135  ? RBC 4.59 02/19/2022 0135  ? HGB 13.0 02/19/2022 0135  ? HGB 14.1 11/17/2013 0621  ? HCT 38.2 (L) 02/19/2022 0135  ? HCT 40.1 11/17/2013 0621  ? PLT 181 02/19/2022 0135  ? PLT 218 11/17/2013 0621  ? MCV 83.2 02/19/2022 0135  ? MCV 86 11/17/2013 0621  ? MCH 28.3 02/19/2022 0135  ? MCHC 34.0 02/19/2022 0135  ? RDW 14.8 02/19/2022 0135  ? RDW 13.6 11/17/2013 0621  ? LYMPHSABS 2.2 02/16/2022 0530  ? LYMPHSABS 2.4 11/17/2013 0621  ? MONOABS 1.3 (H) 02/16/2022 0530  ? MONOABS 0.9 11/17/2013 0621  ? EOSABS 0.0 02/16/2022 0530  ? EOSABS 0.1 11/17/2013 0621  ? BASOSABS 0.0 02/16/2022 0530  ? BASOSABS 0.0 11/17/2013 [id number]  ? ? ?BMET ?   ?Component Value Date/Time  ? NA 136 02/19/2022 0135  ? NA 135 (L) 11/17/2013 [id number]  ? K 4.0 02/19/2022 0135  ? K 4.3 11/17/2013 0621  ? CL 109 02/19/2022 0135  ? CL 99 11/17/2013 0621  ? CO2 19 (L) 02/19/2022 0135  ? CO2 31 11/17/2013 0621  ? GLUCOSE 260 (H) 02/19/2022 0135  ? GLUCOSE 172 (H) 11/17/2013 [id number]  ? BUN 13 02/19/2022 0135  ? BUN 10 11/17/2013 0621  ? CREATININE 0.83 02/19/2022 0135  ? CREATININE 0.85 11/17/2013 0621  ? CALCIUM 8.8 (L) 02/19/2022 0135  ? CALCIUM 9.6 11/17/2013 0621  ? GFRNONAA >60 02/19/2022 0135  ? GFRNONAA >60 11/17/2013 [id number]  ? GFRAA >60 01/10/2020 0808  ? GFRAA >60 11/17/2013 [id number]  ? ? ?INR ?   ?Component Value Date/Time  ? INR 1.3 (H) 02/15/2022 2051  ? ? ? ?Intake/Output Summary (Last 24 hours) at 02/19/2022 0737 ?Last data filed at 02/18/2022 2012 ?Gross per 24 hour  ?Intake --  ?Output 1700 ml  ?Net -1700 ml  ? ? ? ?Assessment/Plan:  66 y.o. male with right 5th toe osteomyelitis Hospital Day  3 ? ?-plan for angiogram tomorrow with Dr. Carlis Abbott.  Pt ready to proceed.   ?-npo/consent/labs ordered.  ?-appears last dose of Eliquis was 5/14 am dose.  ? ? ?Leontine Locket, PA-C ?[facility] ?[phone] ?02/19/2022 ?7:37 AM ? ? ?I have seen and evaluated the patient. I agree with the PA note as documented above.  ? ?Marty Heck, MD ?[facility] ?Office: [phone] ? ?

## 2022-02-19 NOTE — Progress Notes (Signed)
Informed consent signed and placed in pt's chart for procedure tomorrow.  ?

## 2022-02-19 NOTE — Progress Notes (Signed)
? ?[facility] Infectious Diseases Follow Up Note ? ?Patient Identification: ?Patient Name: Austin [name] MRN: [id number] [place] Date: 02/15/2022  8:47 PM ?Age: 66 y.o.Today's Date: 02/19/2022 ? ? ?Reason for Visit: Diabetic foot infection/osteomyelitis ? ?Principal Problem: ?  Diabetic foot ulcer associated with type 2 diabetes mellitus ([place]) ?Active Problems: ?  Essential hypertension ?  Type 2 diabetes mellitus with diabetic peripheral angiopathy without gangrene ([place]) ?  Status post below-knee amputation (HCC) - left side ?  Acute metabolic encephalopathy ?  Sepsis with acute organ dysfunction without septic shock (HCC) ?  Atrial fibrillation, chronic ([place]) ? ? ?Antibiotics:  ?Vancomycin 5/12-c ?Ceftriaxone 5/12, cefepime 5/13-c ?Metronidazole 5/12 ? ?Lines/Hardwares:  ? ?Interval Events: Continues to be afebrile, no leukocytosis ? ? 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 ? ?Recommendations ?Continue vancomycin, pharmacy to dose ?Will switch cefepime to ceftriaxone and metronidazole PO ?Plan for angiogram with vascular surgery on 5/17 noted ?Fu blood cultures  ?Follow-up podiatry plans ?Monitor CBC BMP and vancomycin trough ? ?Rest of the management as per the primary team. ?Thank you for the consult. Please page with pertinent questions or concerns. ? ?______________________________________________________________________ ?Subjective ?patient seen and examined at the bedside.  ?He is eating lunch ?Denies nausea, vomiting and diarrhea ? ?Vitals ?BP 120/66 (BP Location: Right Arm)   Pulse 60   Temp 98.6 ?F (37 ?C) (Oral)   Resp 17   Ht 6\' 4"  (1.93 m)   Wt 102.4 kg   SpO2 98%   BMI 27.48 kg/m?  ? ?  ?Physical Exam ?Constitutional: lying in the bed and  having lunch ?   Comments:  ? ?Cardiovascular:  ?   Rate and Rhythm: Normal rate and regular rhythm.  ?   Heart sounds:  ? ?Pulmonary:  ?   Effort: Pulmonary effort is normal on room air  ?   Comments:  ? ?Abdominal:  ?   Palpations: Abdomen is soft.  ?   Tenderness: Distended and nontender ? ?Musculoskeletal:     ?   General: Right fifth metatarsal ulcer, Left BKA ? ?Skin: ?   Comments:  ? ?Neurological:  ?   General: Grossly non focal, awake, alert and oriented  ? ?Psychiatric:     ?   Mood and Affect: Mood normal.  ? ?Pertinent Microbiology ?Results for orders placed or performed during the hospital encounter of 02/15/22  ?Culture, blood (single)     Status: None (Preliminary result)  ? Collection Time: 02/15/22  9:27 PM  ? Specimen: BLOOD  ?Result Value Ref Range Status  ? Specimen Description BLOOD LEFT ANTECUBITAL  Final  ? Special Requests   Final  ?  BOTTLES DRAWN AEROBIC AND ANAEROBIC Blood Culture results may not be optimal due to an excessive volume of blood received in culture bottles  ? Culture   Final  ?  NO GROWTH 4 DAYS ?Performed at [facility], [place] [address] [place], [place] [address] ?  ? Report Status PENDING  Incomplete  ?MRSA Next Gen by PCR, Nasal     Status: None  ? Collection Time: 02/19/22  8:50 AM  ? Specimen: Nasal Mucosa; Nasal Swab  ?Result Value Ref Range Status  ? MRSA by PCR Next Gen NOT DETECTED NOT DETECTED Final  ?  Comment: (NOTE) ?The GeneXpert MRSA Assay (FDA approved for NASAL  specimens only), ?is one component of a comprehensive MRSA colonization surveillance ?program. It is not intended to diagnose MRSA infection nor to guide ?or monitor treatment for MRSA infections. ?Test performance is not FDA approved in patients less than 2 years ?old. ?Performed at [facility], [place] [address], [place], [place] ?[address] ?  ? ? ?Pertinent Lab. ? ?  Latest Ref Rng & Units 02/19/2022  ?  1:35 AM 02/18/2022  ?  1:46 AM 02/17/2022  ? 12:52 AM  ?CBC  ?WBC 4.0 - 10.5 K/uL 7.6    6.5   8.5    ?Hemoglobin 13.0 - 17.0 g/dL 13.0   12.6   13.2    ?Hematocrit 39.0 - 52.0 % 38.2   38.0   38.2    ?Platelets 150 - 400 K/uL 181   159   127    ? ? ?  Latest Ref Rng & Units 02/19/2022  ?  1:35 AM 02/18/2022  ?  1:46 AM 02/17/2022  ? 12:52 AM  ?CMP  ?Glucose 70 - 99 mg/dL 260   366   243    ?BUN 8 - 23 mg/dL 13   14   10     ?Creatinine 0.61 - 1.24 mg/dL 0.83   0.94   0.82    ?Sodium 135 - 145 mmol/L 136   135   135    ?Potassium 3.5 - 5.1 mmol/L 4.0   4.3   4.4    ?Chloride 98 - 111 mmol/L 109   106   104    ?CO2 22 - 32 mmol/L 19   21   21     ?Calcium 8.9 - 10.3 mg/dL 8.8   8.6   8.4    ? ? ? ?Pertinent Imaging today ?Plain films and CT images have been personally visualized and interpreted; radiology reports have been reviewed. Decision making incorporated into the Impression / Recommendations. ? ?I spent approx 55  minutes for this patient encounter including review of prior medical records, coordination of care with primary/other specialist with greater than 50% of time being face to face/counseling and discussing diagnostics/treatment plan with the patient/family. ? ?Electronically signed by:  ? ?Rosiland Oz, MD ?Infectious Disease Physician ?[facility] for Infectious Disease ?Pager: [phone] ? ?

## 2022-02-19 NOTE — Progress Notes (Signed)
?PROGRESS NOTE ? ? ? ?Austin [name]  [id number] DOB: 04/16/1956 DOA: 02/15/2022 ?PCP: Preston Fleeting, MD  ? ?Brief Narrative:  ?66 year old with history of DM2, HTN, PVD, A-fib on Eliquis, HTN, status post left BKA came to the ED after falling off the couch due to weakness and change in mental status.  Upon admission patient was noted to be in sepsis secondary to likely diabetic foot ulcer.  Upon admission patient was seen by neurology team, MRI brain was negative.  MRI was positive for right foot fifth digit osteomyelitis.  Podiatry, infectious disease and vascular team were consulted.  Plans for vascular intervention possibly 5/17, Eliquis on hold at their request ? ? ?Assessment & Plan: ? Principal Problem: ?  Diabetic foot ulcer associated with type 2 diabetes mellitus (HCC) ?Active Problems: ?  Acute metabolic encephalopathy ?  Sepsis with acute organ dysfunction without septic shock (HCC) ?  Essential hypertension ?  Type 2 diabetes mellitus with diabetic peripheral angiopathy without gangrene (HCC) ?  Status post below-knee amputation (HCC) - left side ?  Atrial fibrillation, chronic (HCC) ?  ? ? ?Assessment and Plan: ? ? 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  Concerns of osteomyelitis ?-Continue IV vancomycin and cefepime. ?- MRI foot shows digital fist metatarsal osteomyelitis with surrounding muscular edema ?-Podiatry, VVS and infectious disease has been consulted.  Appreciate input ?-Vascular planning on angio tomorrow, Eliquis on hold with their request ?-12/11/20 Patient had PCI and balloon angioplasty of right lower extremity, stent placement of left iliac.  ? ?Diabetic foot ulcer associated with type 2 diabetes mellitus (HCC) ?Type 2 diabetes mellitus with diabetic peripheral angiopathy without gangrene (HCC) ?- Hemoglobin A1c 11.3 on outpatient Toujeo and  Trulicity?  Versus Levemir? ?- Sliding scale and Accu-Cheks ?-Diabetic coordinator ?-Semglee 40 units at bedtime ?-NovoLog 5 units premeals ? ?* Acute metabolic encephalopathy ?This is resolved.  MRI brain showed remote infarct.  Seen by neurology team.  TSH and ammonia is normal ? ?Atrial fibrillation, chronic (HCC) ?Currently Eliquis is on hold in anticipation for vascular intervention tomorrow ? ?Status post below-knee amputation (HCC) - left side ?Chronic. Stable. ? ?Essential hypertension ?Continue cozaar ? ?DVT prophylaxis: Eliquis, on hold in anticipation for vascular intervention tomorrow ?Code Status: Full code ?Family Communication:   ? ?Status is: Inpatient ?Remains inpatient appropriate because: Maintain hospital stay for IV antibiotics, vascular intervention tomorrow with likely amputation by podiatry thereafter. ? ?Subjective: ?Seen and examined at bedside, remains afebrile. ?Examination: ?Constitutional: Not in acute distress ?Respiratory: Clear to auscultation bilaterally ?Cardiovascular: Normal sinus rhythm, no rubs ?Abdomen: Nontender nondistended good bowel sounds ?Musculoskeletal: No edema noted ?Skin: Right foot diabetic foot ulcer with surrounding erythema and warmth extending up to his shin ?Neurologic: CN 2-12 grossly intact.  And nonfocal ?Psychiatric: Normal judgment and insight. Alert and oriented x 3. Normal mood. ? ? ? ?Objective: ?Vitals:  ? 02/19/22 0300 02/19/22 0400 02/19/22 0402 02/19/22 0500  ?BP:   107/80   ?Pulse:   78   ?Resp: 16 18 19 19   ?Temp:   97.8 ?F (36.6 ?C)   ?TempSrc:   Oral   ?SpO2:      ?Weight:    102.4 kg  ?Height:    6\' 4"  (1.93 m)  ? ? ?Intake/Output Summary (Last 24 hours) at 02/19/2022 0739 ?Last data filed at 02/18/2022 2012 ?Gross per 24 hour  ?Intake --  ?Output 1700  ml  ?Net -1700 ml  ? ?Filed Weights  ? 02/17/22 0246 02/18/22 0400 02/19/22 0500  ?Weight: 106.1 kg 104.5 kg 102.4 kg  ? ? ? ?Data Reviewed:  ? ?CBC: ?Recent Labs  ?Lab 02/15/22 ?2051  02/15/22 ?2055 02/16/22 ?0530 02/17/22 ?0052 02/18/22 ?0146 02/19/22 ?0135  ?WBC 16.9*  --  13.6* 8.5 6.5 7.6  ?NEUTROABS 13.7*  --  10.1*  --   --   --   ?HGB 14.4 15.0 14.5 13.2 12.6* 13.0  ?HCT 43.6 44.0 43.5 38.2* 38.0* 38.2*  ?MCV 84.0  --  83.7 82.7 84.4 83.2  ?PLT 164  --  167 127* 159 181  ? ?Basic Metabolic Panel: ?Recent Labs  ?Lab 02/15/22 ?2051 02/15/22 ?2055 02/16/22 ?0530 02/17/22 ?0052 02/18/22 ?0146 02/19/22 ?0135  ?NA 136 136 136 135 135 136  ?K 3.8 3.8 3.8 4.4 4.3 4.0  ?CL 103 101 104 104 106 109  ?CO2 23  --  27 21* 21* 19*  ?GLUCOSE 302* 296* 243* 243* 366* 260*  ?BUN 14 14 10 10 14 13   ?CREATININE 1.24 1.00 1.04 0.82 0.94 0.83  ?CALCIUM 9.1  --  8.8* 8.4* 8.6* 8.8*  ?MG  --   --  1.5* 1.8 1.7 1.7  ? ?GFR: ?Estimated Creatinine Clearance: 107.5 mL/min (by C-G formula based on SCr of 0.83 mg/dL). ?Liver Function Tests: ?Recent Labs  ?Lab 02/15/22 ?2051 02/16/22 ?0530  ?AST 19 16  ?ALT 18 16  ?ALKPHOS 63 64  ?BILITOT 0.9 1.2  ?PROT 7.0 6.9  ?ALBUMIN 3.1* 2.7*  ? ?No results for input(s): LIPASE, AMYLASE in the last 168 hours. ?Recent Labs  ?Lab 02/16/22 ?0530  ?AMMONIA 13  ? ?Coagulation Profile: ?Recent Labs  ?Lab 02/15/22 ?2051  ?INR 1.3*  ? ?Cardiac Enzymes: ?No results for input(s): CKTOTAL, CKMB, CKMBINDEX, TROPONINI in the last 168 hours. ?BNP (last 3 results) ?No results for input(s): PROBNP in the last 8760 hours. ?HbA1C: ?Recent Labs  ?  02/17/22 ?0052  ?HGBA1C 11.3*  ? ?CBG: ?Recent Labs  ?Lab 02/18/22 ?0846 02/18/22 ?1220 02/18/22 ?1711 02/18/22 ?2119 02/19/22 ?0141  ?GLUCAP 339* 272* 360* 269* 237*  ? ?Lipid Profile: ?No results for input(s): CHOL, HDL, LDLCALC, TRIG, CHOLHDL, LDLDIRECT in the last 72 hours. ?Thyroid Function Tests: ?No results for input(s): TSH, T4TOTAL, FREET4, T3FREE, THYROIDAB in the last 72 hours. ? ?Anemia Panel: ?No results for input(s): VITAMINB12, FOLATE, FERRITIN, TIBC, IRON, RETICCTPCT in the last 72 hours. ? ?Sepsis Labs: ?Recent Labs  ?Lab 02/15/22 ?2130  02/16/22 ?0239 02/16/22 ?0530 02/17/22 ?0052  ?PROCALCITON  --   --  0.34 0.19  ?LATICACIDVEN 2.0* 2.6*  --  1.5  ? ? ?Recent Results (from the past 240 hour(s))  ?Culture, blood (single)     Status: None (Preliminary result)  ? Collection Time: 02/15/22  9:27 PM  ? Specimen: BLOOD  ?Result Value Ref Range Status  ? Specimen Description BLOOD LEFT ANTECUBITAL  Final  ? Special Requests   Final  ?  BOTTLES DRAWN AEROBIC AND ANAEROBIC Blood Culture results may not be optimal due to an excessive volume of blood received in culture bottles  ? Culture   Final  ?  NO GROWTH 3 DAYS ?Performed at [facility], [place] [address] [place], [place] [address] ?  ? Report Status PENDING  Incomplete  ?  ? ? ? ? ? ?Radiology Studies: ?No results found. ? ? ? ? ? ?Scheduled Meds: ? aspirin EC  81 mg Oral Daily  ?  atorvastatin  80 mg Oral q1800  ? insulin aspart  0-15 Units Subcutaneous TID WC  ? insulin aspart  0-5 Units Subcutaneous QHS  ? insulin aspart  5 Units Subcutaneous TID WC  ? insulin glargine-yfgn  40 Units Subcutaneous Daily  ? losartan  25 mg Oral Daily  ? magnesium oxide  800 mg Oral Once  ? sodium chloride flush  3 mL Intravenous Once  ? ?Continuous Infusions: ? ceFEPime (MAXIPIME) IV 2 g (02/19/22 0008)  ? vancomycin 1,500 mg (02/18/22 2228)  ? ? ? LOS: 3 days  ? ?Time spent= 35 mins ? ? ? ?Austin Olarte Arsenio Loader, MD ?[facility] ? ?If 7PM-7AM, please contact night-coverage ? ?02/19/2022, 7:39 AM  ? ?

## 2022-02-19 NOTE — Care Management Important Message (Signed)
Important Message ? ?Patient Details  ?Name: Austin [name] ?MRN: [id number] ?Date of Birth: 18-Jun-1956 ? ? ?Medicare Important Message Given:  Yes ? ? ? ? ?Austin [name] ?02/19/2022, 3:04 PM ?

## 2022-02-19 NOTE — Progress Notes (Signed)
Inpatient Diabetes Program Recommendations ? ?AACE/ADA: New Consensus Statement on Inpatient Glycemic Control (2015) ? ?Target Ranges:  Prepandial:   less than 140 mg/dL ?     Peak postprandial:   less than 180 mg/dL (1-2 hours) ?     Critically ill patients:  140 - 180 mg/dL  ? ?Lab Results  ?Component Value Date  ? GLUCAP 315 (H) 02/19/2022  ? HGBA1C 11.3 (H) 02/17/2022  ? ? ?Review of Glycemic Control ? Latest Reference Range & Units 02/18/22 08:46 02/18/22 12:20 02/18/22 17:11 02/18/22 21:19 02/19/22 01:41 02/19/22 08:18 02/19/22 11:44  ?Glucose-Capillary 70 - 99 mg/dL 339 (H) 272 (H) 360 (H) 269 (H) 237 (H) 287 (H) 315 (H)  ? ?Diabetes history: DM2 ?Outpatient Diabetes medications: Toujeo 60 units QHS, Humalog 10 units TID, Trulicity [id number] mg Weekly ?Current orders for Inpatient glycemic control: Semgle 40 units QD, Novolog 0-15 units TID and 0-5 QHS, Novolog 5 units TID ? ?-   Increase Semglee to 50 units ?-   Increase Novolog meal coverage to 8 units tid ? ? ?Thanks, ? ?Tama Headings RN, MSN, BC-ADM ?Inpatient Diabetes Coordinator ?Team Pager [phone] (8a-5p) ?

## 2022-02-19 NOTE — Progress Notes (Signed)
Overnight progress note ? ?Notified by RN that telemetry was showing A-fib with slow ventricular response, heart rate as low as 39-40.  This lasted for less than a minute and heart rate currently fluctuating between 50s to 70s.  Patient was sleeping during this episode, no complaints.  Blood pressure 138/66. ? ?Chart reviewed, patient with history of diabetes, hypertension, chronic A-fib admitted for sepsis secondary to infected diabetic foot ulcer/osteomyelitis.  Patient has not received any AV nodal blocking agents.  There is order for PRN IV metoprolol but no doses administered.  TSH was normal on labs done 3 days ago. ? ?Stat EKG done and showing A-fib with slow ventricular response, rate in the 50s. ? ?-Discussed with cardiology, bradycardia felt to be related to sleep ?-Stat labs ordered to check troponin and electrolytes ?-Discontinued PRN IV metoprolol ? ?

## 2022-02-20 ENCOUNTER — Encounter (HOSPITAL_COMMUNITY)
Admission: EM | Disposition: A | Discharge status: Rehabilitation Facility | Payer: Self-pay | Source: Home / Self Care | Attending: Internal Medicine

## 2022-02-20 DIAGNOSIS — I482 Chronic atrial fibrillation, unspecified: Secondary | ICD-10-CM | POA: Diagnosis not present

## 2022-02-20 DIAGNOSIS — L97513 Non-pressure chronic ulcer of other part of right foot with necrosis of muscle: Secondary | ICD-10-CM | POA: Diagnosis not present

## 2022-02-20 DIAGNOSIS — M869 Osteomyelitis, unspecified: Secondary | ICD-10-CM | POA: Diagnosis present

## 2022-02-20 DIAGNOSIS — E11621 Type 2 diabetes mellitus with foot ulcer: Secondary | ICD-10-CM | POA: Diagnosis not present

## 2022-02-20 DIAGNOSIS — G9341 Metabolic encephalopathy: Secondary | ICD-10-CM | POA: Diagnosis not present

## 2022-02-20 DIAGNOSIS — I70244 Atherosclerosis of native arteries of left leg with ulceration of heel and midfoot: Secondary | ICD-10-CM

## 2022-02-20 HISTORY — PX: ABDOMINAL AORTOGRAM W/LOWER EXTREMITY: CATH118223

## 2022-02-20 HISTORY — PX: PERIPHERAL VASCULAR INTERVENTION: CATH118257

## 2022-02-20 LAB — CBC
HCT: 40.7 % (ref 39.0–52.0)
Hemoglobin: 13.3 g/dL (ref 13.0–17.0)
MCH: 27.7 pg (ref 26.0–34.0)
MCHC: 32.7 g/dL (ref 30.0–36.0)
MCV: 84.8 fL (ref 80.0–100.0)
Platelets: 204 10*3/uL (ref 150–400)
RBC: 4.8 MIL/uL (ref 4.22–5.81)
RDW: 14.6 % (ref 11.5–15.5)
WBC: 8.7 10*3/uL (ref 4.0–10.5)
nRBC: 0 % (ref 0.0–0.2)

## 2022-02-20 LAB — VANCOMYCIN, PEAK: Vancomycin Pk: 35 ug/mL (ref 30–40)

## 2022-02-20 LAB — GLUCOSE, CAPILLARY
Glucose-Capillary: 312 mg/dL — ABNORMAL HIGH (ref 70–99)
Glucose-Capillary: 115 mg/dL — ABNORMAL HIGH (ref 70–99)
Glucose-Capillary: 287 mg/dL — ABNORMAL HIGH (ref 70–99)
Glucose-Capillary: 403 mg/dL — ABNORMAL HIGH (ref 70–99)
Glucose-Capillary: 370 mg/dL — ABNORMAL HIGH (ref 70–99)

## 2022-02-20 LAB — BASIC METABOLIC PANEL
Anion gap: 7 (ref 5–15)
BUN: 11 mg/dL (ref 8–23)
CO2: 23 mmol/L (ref 22–32)
Calcium: 8.8 mg/dL — ABNORMAL LOW (ref 8.9–10.3)
Chloride: 106 mmol/L (ref 98–111)
Creatinine, Ser: 0.86 mg/dL (ref 0.61–1.24)
GFR, Estimated: >60 mL/min (ref >60)
Glucose, Bld: 324 mg/dL — ABNORMAL HIGH (ref 70–99)
Potassium: 4.1 mmol/L (ref 3.5–5.1)
Sodium: 136 mmol/L (ref 135–145)

## 2022-02-20 LAB — POCT ACTIVATED CLOTTING TIME
Activated Clotting Time: 287 seconds
Activated Clotting Time: 167 seconds
Activated Clotting Time: 185 seconds

## 2022-02-20 LAB — CULTURE, BLOOD (SINGLE): Culture: NO GROWTH

## 2022-02-20 LAB — SURGICAL PCR SCREEN
MRSA, PCR: NEGATIVE
Staphylococcus aureus: NEGATIVE

## 2022-02-20 LAB — MAGNESIUM: Magnesium: 1.7 mg/dL (ref 1.7–2.4)

## 2022-02-20 LAB — VANCOMYCIN, TROUGH: Vancomycin Tr: 15 ug/mL (ref 15–20)

## 2022-02-20 SURGERY — ABDOMINAL AORTOGRAM W/LOWER EXTREMITY
Anesthesia: LOCAL

## 2022-02-20 MED ORDER — IODIXANOL 320 MG/ML IV SOLN
INTRAVENOUS | Status: DC | PRN
Start: 2022-02-20 — End: 2022-02-20
  Administered 2022-02-20: 100 mL

## 2022-02-20 MED ORDER — SODIUM CHLORIDE 0.9 % IV SOLN: 250.0000 mL | INTRAVENOUS | Status: DC | PRN

## 2022-02-20 MED ORDER — SODIUM CHLORIDE 0.9 % IV SOLN
2.0000 g | INTRAVENOUS | Status: DC
  Administered 2022-02-20 – 2022-03-02 (×11): 2 g via INTRAVENOUS
  Filled 2022-02-20 (×13): qty 20

## 2022-02-20 MED ORDER — FENTANYL CITRATE (PF) 100 MCG/2ML IJ SOLN
INTRAMUSCULAR | Status: DC | PRN
  Administered 2022-02-20: 25 ug via INTRAVENOUS

## 2022-02-20 MED ORDER — LIDOCAINE HCL (PF) 1 % IJ SOLN
INTRAMUSCULAR | Status: DC | PRN
  Administered 2022-02-20: 12 mL

## 2022-02-20 MED ORDER — MIDAZOLAM HCL 2 MG/2ML IJ SOLN
INTRAMUSCULAR | Status: DC | PRN
Start: 2022-02-20 — End: 2022-02-20
  Administered 2022-02-20: 1 mg via INTRAVENOUS

## 2022-02-20 MED ORDER — ONDANSETRON HCL 4 MG/2ML IJ SOLN: 4.0000 mg | Freq: Four times a day (QID) | INTRAMUSCULAR | Status: DC | PRN

## 2022-02-20 MED ORDER — HYDRALAZINE HCL 20 MG/ML IJ SOLN: 5.0000 mg | INTRAMUSCULAR | Status: DC | PRN

## 2022-02-20 MED ORDER — SODIUM CHLORIDE 0.9% FLUSH: 3.0000 mL | INTRAVENOUS | Status: DC | PRN

## 2022-02-20 MED ORDER — SODIUM CHLORIDE 0.9 % IV SOLN: INTRAVENOUS | Status: AC

## 2022-02-20 MED ORDER — INSULIN GLARGINE-YFGN 100 UNIT/ML ~~LOC~~ SOLN
30.0000 [IU] | Freq: Every day | SUBCUTANEOUS | Status: DC
  Administered 2022-02-20 – 2022-02-21 (×2): 30 [IU] via SUBCUTANEOUS
  Filled 2022-02-20 (×2): qty 0.3

## 2022-02-20 MED ORDER — METRONIDAZOLE 500 MG PO TABS
500.0000 mg | ORAL_TABLET | Freq: Two times a day (BID) | ORAL | Status: DC
  Administered 2022-02-20 – 2022-03-02 (×20): 500 mg via ORAL
  Filled 2022-02-20 (×20): qty 1

## 2022-02-20 MED ORDER — HEPARIN SODIUM (PORCINE) 1000 UNIT/ML IJ SOLN
INTRAMUSCULAR | Status: DC | PRN
  Administered 2022-02-20: 10000 [IU] via INTRAVENOUS

## 2022-02-20 MED ORDER — MUPIROCIN 2 % EX OINT
1.0000 "application " | TOPICAL_OINTMENT | Freq: Two times a day (BID) | CUTANEOUS | Status: AC
  Administered 2022-02-20 – 2022-02-24 (×8): 1 via NASAL
  Filled 2022-02-20 (×4): qty 22

## 2022-02-20 MED ORDER — HEPARIN SODIUM (PORCINE) 1000 UNIT/ML IJ SOLN
INTRAMUSCULAR | Status: AC
  Filled 2022-02-20: qty 10

## 2022-02-20 MED ORDER — SODIUM CHLORIDE 0.9% FLUSH
3.0000 mL | Freq: Two times a day (BID) | INTRAVENOUS | Status: DC
  Administered 2022-02-23 – 2022-02-24 (×3): 3 mL via INTRAVENOUS

## 2022-02-20 MED ORDER — FENTANYL CITRATE (PF) 100 MCG/2ML IJ SOLN
INTRAMUSCULAR | Status: AC
Start: 2022-02-20 — End: ?
  Filled 2022-02-20: qty 2

## 2022-02-20 MED ORDER — LIDOCAINE HCL (PF) 1 % IJ SOLN
INTRAMUSCULAR | Status: AC
  Filled 2022-02-20: qty 30

## 2022-02-20 MED ORDER — MIDAZOLAM HCL 2 MG/2ML IJ SOLN
INTRAMUSCULAR | Status: AC
  Filled 2022-02-20: qty 2

## 2022-02-20 MED ORDER — SODIUM CHLORIDE 0.9 % IV SOLN: INTRAVENOUS | Status: DC

## 2022-02-20 MED ORDER — INSULIN GLARGINE-YFGN 100 UNIT/ML ~~LOC~~ SOLN
40.0000 [IU] | Freq: Every day | SUBCUTANEOUS | Status: DC
  Filled 2022-02-20: qty 0.4

## 2022-02-20 MED ORDER — VANCOMYCIN HCL 1250 MG/250ML IV SOLN
1250.0000 mg | Freq: Two times a day (BID) | INTRAVENOUS | Status: DC
  Administered 2022-02-20 – 2022-02-28 (×16): 1250 mg via INTRAVENOUS
  Filled 2022-02-20 (×21): qty 250

## 2022-02-20 MED ORDER — LABETALOL HCL 5 MG/ML IV SOLN: 10.0000 mg | INTRAVENOUS | Status: DC | PRN

## 2022-02-20 MED ORDER — ACETAMINOPHEN 325 MG PO TABS: 650.0000 mg | ORAL_TABLET | ORAL | Status: DC | PRN

## 2022-02-20 MED ORDER — HEPARIN (PORCINE) IN NACL 1000-0.9 UT/500ML-% IV SOLN
INTRAVENOUS | Status: AC
  Filled 2022-02-20: qty 1000

## 2022-02-20 MED ORDER — HEPARIN (PORCINE) IN NACL 1000-0.9 UT/500ML-% IV SOLN
INTRAVENOUS | Status: DC | PRN
  Administered 2022-02-20 (×2): 500 mL

## 2022-02-20 SURGICAL SUPPLY — 14 items
BALLN MUSTANG 6.0X40 75 (BALLOONS) ×3
BALLOON MUSTANG 6.0X40 75 (BALLOONS) IMPLANT
CATH ANGIO 5F BER2 65CM (CATHETERS) ×1 IMPLANT
CATH OMNI FLUSH 5F 65CM (CATHETERS) ×1 IMPLANT
KIT MICROPUNCTURE NIT STIFF (SHEATH) ×1 IMPLANT
KIT PV (KITS) ×3 IMPLANT
SHEATH FLEX ANSEL ANG 6F 45CM (SHEATH) ×1 IMPLANT
SHEATH PINNACLE 5F 10CM (SHEATH) ×1 IMPLANT
STENT INNOVA 7X40X130 (Permanent Stent) ×1 IMPLANT
SYR MEDRAD MARK V 150ML (SYRINGE) ×1 IMPLANT
TRANSDUCER W/STOPCOCK (MISCELLANEOUS) ×3 IMPLANT
TRAY PV CATH (CUSTOM PROCEDURE TRAY) ×3 IMPLANT
WIRE BENTSON .035X145CM (WIRE) ×1 IMPLANT
WIRE STARTER ROSEN .035X260 (WIRE) ×1 IMPLANT

## 2022-02-20 NOTE — Op Note (Signed)
? ? ?Patient name: Austin [name] MRN: [id number] DOB: 1956/06/29 Sex: male ? ?02/20/2022 ?Pre-operative Diagnosis: Critical limb ischemia of the right lower extremity with tissue loss and foot wound ?Post-operative diagnosis:  Same ?Surgeon:  Cephus Shelling, MD ?Procedure Performed: ?1.  Ultrasound-guided access left common femoral artery ?2.  Aortogram with catheter selection of aorta ?3.  Right lower extremity arteriogram with selection of second-order branches ?4.  Right external iliac artery angioplasty with stent placement (7 mm x 40 mm self-expanding Innova postdilated with a 6 mm Mustang) ?5.  61 minutes of monitored moderate conscious sedation time ? ?Indications: 66 year old male seen in consultation over the weekend with right foot wound.  He has undergone multiple previous lower extremity interventions at [facility].  Ultimately he was seen with a dampened monophasic waveform in the right foot and severely diminished toe pressure of 36 inadequate for wound healing.  He presents today for aortogram, lower extremity arteriogram, possible intervention after risks and benefits discussed. ? ?Findings:  ? ?Aortogram showed both renal arteries are patent as well as the infrarenal aorta.  On the right which is the side of interest, his common iliac artery stent and external iliac stent are both widely patent.  There was about a 60% stenosis in the right external iliac artery distal to his existing stents.  The common femoral is patent with profunda runoff.  There is a flush SFA occlusion on the right and his previous SFA stents in the proximal SFA are occluded.  He does reconstitute a tibioperoneal trunk with runoff in the peroneal and posterior tibial also retrograde filling of the anterior tibial off the TP trunk with runoff into the foot.  Posterior tibial occludes above the ankle. ? ?Ultimately the right external iliac stenosis was stented with a 7 mm self-expanding Innova with no residual stenosis.   ?  ?Procedure:  The patient was identified in the holding area and taken to room 8.  The patient was then placed supine on the table and prepped and draped in the usual sterile fashion.  A time out was called.  Patient did receive moderate sedation with Versed and fentanyl.  All of his vital signs were monitored including heart rate, respiratory rate, blood pressure and oxygenation.  I was present for the entire sedation.  Ultrasound was used to evaluate the left common femoral artery.  It was patent .  A digital ultrasound image was acquired.  A micropuncture needle was used to access the left common femoral artery under ultrasound guidance.  An 018 wire was advanced without resistance and a micropuncture sheath was placed.  The 018 wire was removed and a benson wire was placed.  The micropuncture sheath was exchanged for a 5 french sheath.  An omniflush catheter was advanced over the wire to the level of L-1.  An abdominal angiogram was obtained.  Next, using the omniflush catheter and a benson wire, the aortic bifurcation was crossed and the catheter was placed into the right external iliac artery and right runoff was obtained.  Pertinent findings are noted above.  After evaluating the images, it looked like he needs a right lower extremity tibial bypass.  He has a very diminished femoral pulse on the right.  I elected to stent the 60% right external iliac stenosis for better inflow to support his bypass.  I upsized to a long 6 [place] Ansell sheath over the aortic bifurcation in the left groin with a Rosen wire.  Patient was given 100 units/kg IV  heparin.  I then got dedicated imaging with oblique orientation of the C arm to mark the right external iliac stenosis.  The right external iliac stenosis was stented with a 7 mm x 40 mm self-expanding Innova postdilated with a 6 mm Mustang.  Excellent results with no residual stenosis.  Sheath was pulled back over the aortic bifurcation and he was taken to holding for  sheath pull. ? ? ?Plan: Right external iliac artery 60% stenosis was stented with a 7 mm x 40 mm self-expanding Innova with excellent results.  Patient will need vein mapping and right leg tibial bypass given flush right SFA occlusion. ?  ? ?Cephus Shelling, MD ?[facility] ?Office: [phone] ? ?Cephus Shelling ? ? ?

## 2022-02-20 NOTE — Progress Notes (Signed)
?Subjective:  ?Patient ID: Austin [name], male    DOB: 06-12-1956,  MRN: [id number] ? ?Patient seen at bedside this afternoon s/p right lower extremity arteriogram and angioplasty of external iliac artery. Patient relates he is doing well. Denies much pain in foot and denies n/v/f/c.   ? ?Past Medical History:  ?Diagnosis Date  ? Coronary artery disease   ? Diabetes (HCC)   ? DVT (deep venous thrombosis) (HCC)   ? Gangrene of left foot (HCC) 02/09/2018  ? GERD (gastroesophageal reflux disease)   ? Hyperlipidemia   ? Hypertension   ? Peripheral vascular disease (HCC)   ?  ? ?Past Surgical History:  ?Procedure Laterality Date  ? AMPUTATION Left 10/28/2018  ? Procedure: AMPUTATION BELOW KNEE;  Surgeon: Annice Needy, MD;  Location: [facility];  Service: Vascular;  Laterality: Left;  ? AMPUTATION Left 09/10/2021  ? Procedure: AMPUTATION DIGIT;  Surgeon: Allena Napoleon, MD;  Location: [facility];  Service: Plastics;  Laterality: Left;  ? AMPUTATION Left 09/14/2021  ? Procedure: AMPUTATION DIGIT left long finger and irrigation and debridement;  Surgeon: Allena Napoleon, MD;  Location: [facility];  Service: Plastics;  Laterality: Left;  ? AMPUTATION TOE Left 02/10/2018  ? Procedure: AMPUTATION TOE;  Surgeon: Linus Galas, DPM;  Location: [facility];  Service: Podiatry;  Laterality: Left;  ? APPLICATION OF WOUND VAC Left 10/16/2018  ? Procedure: APPLICATION OF WOUND VAC;  Surgeon: Gwyneth Revels, DPM;  Location: [facility];  Service: Podiatry;  Laterality: Left;  ? DORSAL SLIT N/A 01/12/2021  ? Procedure: DORSAL SLIT;  Surgeon: Sondra Come, MD;  Location: [facility];  Service: Urology;  Laterality: N/A;  ? groin surgery    ? IRRIGATION AND DEBRIDEMENT FOOT Left 09/30/2018  ? Procedure: IRRIGATION AND DEBRIDEMENT FOOT;  Surgeon: Linus Galas, DPM;  Location: [facility];  Service: Podiatry;  Laterality: Left;  ? LOWER EXTREMITY ANGIOGRAPHY Left 02/12/2018  ? Procedure: Lower Extremity Angiography;  Surgeon: Annice Needy, MD;  Location: [facility];  Service: Cardiovascular;  Laterality: Left;  ? LOWER EXTREMITY ANGIOGRAPHY Left 10/01/2018  ? Procedure: Lower Extremity Angiography;  Surgeon: Annice Needy, MD;  Location: [facility];  Service: Cardiovascular;  Laterality: Left;  ? LOWER EXTREMITY ANGIOGRAPHY Left 10/19/2018  ? Procedure: Lower Extremity Angiography;  Surgeon: Annice Needy, MD;  Location: [facility];  Service: Cardiovascular;  Laterality: Left;  ? LOWER EXTREMITY ANGIOGRAPHY Left 10/20/2018  ? Procedure: LOWER EXTREMITY ANGIOGRAPHY;  Surgeon: Annice Needy, MD;  Location: [facility];  Service: Cardiovascular;  Laterality: Left;  ? LOWER EXTREMITY ANGIOGRAPHY Right 11/25/2018  ? Procedure: LOWER EXTREMITY ANGIOGRAPHY;  Surgeon: Annice Needy, MD;  Location: [facility];  Service: Cardiovascular;  Laterality: Right;  ? LOWER EXTREMITY ANGIOGRAPHY Right 01/10/2020  ? Procedure: LOWER EXTREMITY ANGIOGRAPHY;  Surgeon: Annice Needy, MD;  Location: [facility];  Service: Cardiovascular;  Laterality: Right;  ? LOWER EXTREMITY ANGIOGRAPHY Right 12/11/2020  ? Procedure: LOWER EXTREMITY ANGIOGRAPHY;  Surgeon: Annice Needy, MD;  Location: [facility];  Service: Cardiovascular;  Laterality: Right;  ? LOWER EXTREMITY INTERVENTION  02/12/2018  ? Procedure: LOWER EXTREMITY INTERVENTION;  Surgeon: Annice Needy, MD;  Location: [facility];  Service: Cardiovascular;;  ? MINOR IRRIGATION AND DEBRIDEMENT OF WOUND Left 09/10/2021  ? Procedure: IRRIGATION AND DEBRIDEMENT OF LEFT LONG FINGER WOUND;  Surgeon: Allena Napoleon, MD;  Location: [facility];  Service: Government social research officer;  Laterality: Left;  ? NECK SURGERY    ? TRANSMETATARSAL AMPUTATION Left 10/16/2018  ? Procedure: TRANSMETATARSAL AMPUTATION LEFT FOOT;  Surgeon: Gwyneth Revels, DPM;  Location: [facility];  Service: Podiatry;  Laterality: Left;  ? ? ? ?  Latest Ref Rng & Units 02/20/2022  ?  2:22 AM 02/19/2022  ?  1:35 AM 02/18/2022  ?  1:46 AM  ?CBC  ?WBC 4.0 - 10.5  K/uL 8.7   7.6   6.5    ?Hemoglobin 13.0 - 17.0 g/dL 01.0   93.2   35.5    ?Hematocrit 39.0 - 52.0 % 40.7   38.2   38.0    ?Platelets 150 - 400 K/uL 204   181   159    ? ? ? ?  Latest Ref Rng & Units 02/20/2022  ?  2:22 AM 02/19/2022  ?  1:35 AM 02/18/2022  ?  1:46 AM  ?BMP  ?Glucose 70 - 99 mg/dL 732   202   542    ?BUN 8 - 23 mg/dL 11   13   14     ?Creatinine 0.61 - 1.24 mg/dL   7.06   2.37    ?Sodium 135 - 145 mmol/L 136   136   135    ?Potassium 3.5 - 5.1 mmol/L 4.1   4.0   4.3    ?Chloride 98 - 111 mmol/L 106   109   106    ?CO2 22 - 32 mmol/L 23   19   21     ?Calcium 8.9 - 10.3 mg/dL 8.8   8.8   8.6    ? ? ? ?Objective:  ? ?Vitals:  ? 02/20/22 1615 02/20/22 1629  ?BP: (!) 146/85 (!) 149/82  ?Pulse:  68  ?Resp: (!) 22 19  ?Temp:    ?SpO2:  100%  ? ? ?General:AA&O x 3. Normal mood and affect  ? ?Vascular: DP and PT pulses 2/4 bilateral. Brisk capillary refill to all digits. Pedal hair present  ? ?Neruological. Epicritic sensation grossly intact.  ? ?Dermatology: Full-thickness ulceration plantar fifth metatarsal head right foot with a fibrogranular base probes to bone the wound is approximately 1.5 cm x 2 cm probes to joint capsule.  There is surrounding soft tissue swelling, erythema, however no warmth.  No malodor.  No active purulence. ? ?MSK: MMT 5/5 in dorsiflexion, plantar flexion, inversion and eversion. Normal joint ROM without pain or crepitus.  ? ?Results ?MRI ?IMPRESSION: ?1. Evidence of osteomyelitis of the distal fifth metatarsal as ?described. ?2. Overlying soft tissue wound at the lateral foot near the fifth ?metatarsal head. ?3. Diffuse muscular edema throughout the foot. Marked subcutaneous ?soft tissue edema throughout the dorsum of the foot. ?  ? ? ?Assessment & Plan:  ?Patient was evaluated and treated and all questions answered. ? ?Dx: Right foot diabetic ulcer with fifth metatarsophalangeal joint osteomyelitis.  ?-s/p right lower extremity arteriogram and plan for vascular tibial bypass  on 5/22.  ?-Recommend continue with medical management and IV antibiotics per infectious disease ?-Continue with Mepilex bandage to the right foot ?-Patient to be weightbearing only for transfers ?-Consult appreciated ?-Podiatry to follow; plan for amputation of right fifth toe and distal metatarsal after patient has been optimized by vascular. Will await bypass on 5/22/ ?  ? ?6/22, DPM ? ?Accessible via secure chat for questions or concerns. ? ?

## 2022-02-20 NOTE — Progress Notes (Signed)
?PROGRESS NOTE ? ? ? ?ASAEL FUTRELL  [id number] DOB: Jul 20, 1956 DOA: 02/15/2022 ?PCP: Theotis Burrow, MD  ? ?Brief Narrative:  ?66 year old with history of DM2, HTN, PVD, A-fib on Eliquis, HTN, status post left BKA came to the ED after falling off the couch due to weakness and change in mental status.  Upon admission patient was noted to be in sepsis secondary to likely diabetic foot ulcer.  Upon admission patient was seen by neurology team, MRI brain was negative.  MRI was positive for right foot fifth digit osteomyelitis.  Podiatry, infectious disease and vascular team were consulted.  Plans for vascular intervention possibly 5/17, Eliquis on hold at their request ? ? ?Assessment & Plan: ? Principal Problem: ?  Diabetic foot ulcer associated with type 2 diabetes mellitus ([place]) ?Active Problems: ?  Acute metabolic encephalopathy ?  Sepsis with acute organ dysfunction without septic shock (HCC) ?  Essential hypertension ?  Type 2 diabetes mellitus with diabetic peripheral angiopathy without gangrene ([place]) ?  Status post below-knee amputation (HCC) - left side ?  Atrial fibrillation, chronic ([place]) ?  ? ? ?Assessment and Plan: ? ? 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  Concerns of osteomyelitis ?-Continue IV vancomycin and cefepime. ?- MRI foot shows digital fist metatarsal osteomyelitis with surrounding muscular edema ?-Podiatry, VVS and infectious disease has been consulted.  Appreciate input ?- Eliquis on hold for angiogram today. ?-12/11/20 Patient had PCI and balloon angioplasty of right lower extremity, stent placement of left iliac.  ? ?Diabetic foot ulcer associated with type 2 diabetes mellitus ([place]) ?Type 2 diabetes mellitus with diabetic peripheral angiopathy without gangrene ([place]) ?- Hemoglobin A1c 11.3 on outpatient Toujeo and Trulicity?  Versus Levemir? ?-  Sliding scale and Accu-Cheks ?-Diabetic coordinator ?-Semglee 40 units at bedtime, likely will add 10 units at bedtime if it remains uncontrolled. ?-NovoLog 5 units premeals ? ?Acute metabolic encephalopathy ?This is resolved.  MRI brain showed remote infarct.  Seen by neurology team.  TSH and ammonia is normal ? ?Atrial fibrillation, chronic ([place]) ?Currently Eliquis is on hold in anticipation for vascular intervention tomorrow ? ?Status post below-knee amputation (HCC) - left side ?Chronic. Stable. ? ?Essential hypertension ?Continue cozaar ? ?DVT prophylaxis: Eliquis, on hold in anticipation for vascular intervention tomorrow ?Code Status: Full code ?Family Communication:   ? ?Status is: Inpatient ?Remains inpatient appropriate because: Maintain hospital stay for IV antibiotics, vascular intervention tomorrow with likely amputation by podiatry thereafter. ? ?Subjective: ?He denies any complaints today, no significant events overnight as discussed with staff. ? ?Examination: ? ?Awake Alert, Oriented X 3, No new F.N deficits, Normal affect ?Symmetrical Chest wall movement, Good air movement bilaterally, CTAB ?RRR,No Gallops,Rubs or new Murmurs, No Parasternal Heave ?Abdomen soft, nontender ?Right foot diabetic ulcer with surrounding erythema, left lower extremity BKA ? ? ?Objective: ?Vitals:  ? 02/19/22 1941 02/20/22 0015 02/20/22 0400 02/20/22 0741  ?BP: 133/69 (!) 106/57 120/68 129/68  ?Pulse: 75 80 63 (!) 51  ?Resp: 19 19 12  (!) 23  ?Temp: 98.1 ?F (36.7 ?C) 98.6 ?F (37 ?C) 98.2 ?F (36.8 ?C)   ?TempSrc: Oral Oral Oral   ?SpO2: 98% 98% 98%   ?Weight:   100.7 kg   ?Height:      ? ? ?Intake/Output Summary (Last 24 hours) at 02/20/2022 1128 ?Last data filed at 02/20/2022 0446 ?Gross per 24 hour  ?Intake 800 ml  ?Output 1100  ml  ?Net -300 ml  ? ?Filed Weights  ? 02/18/22 0400 02/19/22 0500 02/20/22 0400  ?Weight: 104.5 kg 102.4 kg 100.7 kg  ? ? ? ?Data Reviewed:  ? ?CBC: ?Recent Labs  ?Lab 02/15/22 ?2051 02/15/22 ?2055  02/16/22 ?0530 02/16/22 ?[id number] 02/17/22 ?0052 02/18/22 ?0146 02/19/22 ?0135 02/20/22 ?0222  ?WBC 16.9*  --  13.6*  --  8.5 6.5 7.6 8.7  ?NEUTROABS 13.7*  --  10.1*  --   --   --   --   --   ?HGB 14.4   < > 14.5  --  13.2 12.6* 13.0 13.3  ?HCT 43.6   < > 43.5 41.0 38.2* 38.0* 38.2* 40.7  ?MCV 84.0  --  83.7  --  82.7 84.4 83.2 84.8  ?PLT 164  --  167  --  127* 159 181 204  ? < > = values in this interval not displayed.  ? ?Basic Metabolic Panel: ?Recent Labs  ?Lab 02/16/22 ?0530 02/17/22 ?0052 02/18/22 ?0146 02/19/22 ?0135 02/20/22 ?0222  ?NA 136 135 135 136 136  ?K 3.8 4.4 4.3 4.0 4.1  ?CL 104 104 106 109 106  ?CO2 27 21* 21* 19* 23  ?GLUCOSE 243* 243* 366* 260* 324*  ?BUN 10 10 14 13 11   ?CREATININE 1.04 0.82 0.94 0.83 0.86  ?CALCIUM 8.8* 8.4* 8.6* 8.8* 8.8*  ?MG 1.5* 1.8 1.7 1.7 1.7  ? ?GFR: ?Estimated Creatinine Clearance: 103.7 mL/min (by C-G formula based on SCr of 0.86 mg/dL). ?Liver Function Tests: ?Recent Labs  ?Lab 02/15/22 ?2051 02/16/22 ?0530  ?AST 19 16  ?ALT 18 16  ?ALKPHOS 63 64  ?BILITOT 0.9 1.2  ?PROT 7.0 6.9  ?ALBUMIN 3.1* 2.7*  ? ?No results for input(s): LIPASE, AMYLASE in the last 168 hours. ?Recent Labs  ?Lab 02/16/22 ?0530  ?AMMONIA 13  ? ?Coagulation Profile: ?Recent Labs  ?Lab 02/15/22 ?2051  ?INR 1.3*  ? ?Cardiac Enzymes: ?No results for input(s): CKTOTAL, CKMB, CKMBINDEX, TROPONINI in the last 168 hours. ?BNP (last 3 results) ?No results for input(s): PROBNP in the last 8760 hours. ?HbA1C: ?No results for input(s): HGBA1C in the last 72 hours. ? ?CBG: ?Recent Labs  ?Lab 02/19/22 ?[id number] 02/19/22 ?1144 02/19/22 ?1638 02/19/22 ?2121 02/20/22 ?[id number]  ?GLUCAP 287* 315* 227* 317* 312*  ? ?Lipid Profile: ?No results for input(s): CHOL, HDL, LDLCALC, TRIG, CHOLHDL, LDLDIRECT in the last 72 hours. ?Thyroid Function Tests: ?No results for input(s): TSH, T4TOTAL, FREET4, T3FREE, THYROIDAB in the last 72 hours. ? ?Anemia Panel: ?No results for input(s): VITAMINB12, FOLATE, FERRITIN, TIBC, IRON, RETICCTPCT  in the last 72 hours. ? ?Sepsis Labs: ?Recent Labs  ?Lab 02/15/22 ?2130 02/16/22 ?0239 02/16/22 ?0530 02/17/22 ?0052  ?PROCALCITON  --   --  0.34 0.19  ?LATICACIDVEN 2.0* 2.6*  --  1.5  ? ? ?Recent Results (from the past 240 hour(s))  ?Culture, blood (single)     Status: None  ? Collection Time: 02/15/22  9:27 PM  ? Specimen: BLOOD  ?Result Value Ref Range Status  ? Specimen Description BLOOD LEFT ANTECUBITAL  Final  ? Special Requests   Final  ?  BOTTLES DRAWN AEROBIC AND ANAEROBIC Blood Culture results may not be optimal due to an excessive volume of blood received in culture bottles  ? Culture   Final  ?  NO GROWTH 5 DAYS ?Performed at [facility], [place] [address], [place], [place] [address] ?  ? Report Status 02/20/2022 FINAL  Final  ?MRSA Next Gen by  PCR, Nasal     Status: None  ? Collection Time: 02/19/22  8:50 AM  ? Specimen: Nasal Mucosa; Nasal Swab  ?Result Value Ref Range Status  ? MRSA by PCR Next Gen NOT DETECTED NOT DETECTED Final  ?  Comment: (NOTE) ?The GeneXpert MRSA Assay (FDA approved for NASAL specimens only), ?is one component of a comprehensive MRSA colonization surveillance ?program. It is not intended to diagnose MRSA infection nor to guide ?or monitor treatment for MRSA infections. ?Test performance is not FDA approved in patients less than 2 years ?old. ?Performed at [facility], [place] [address] [place], [place] ?[address] ?  ?Surgical PCR screen     Status: None  ? Collection Time: 02/20/22  4:12 AM  ? Specimen: Nasal Mucosa; Nasal Swab  ?Result Value Ref Range Status  ? MRSA, PCR NEGATIVE NEGATIVE Final  ? Staphylococcus aureus NEGATIVE NEGATIVE Final  ?  Comment: (NOTE) ?The Xpert SA Assay (FDA approved for NASAL specimens in patients 54 ?years of age and older), is one component of a comprehensive ?surveillance program. It is not intended to diagnose infection nor to ?guide or monitor treatment. ?Performed at [facility], [place] [address] [place], [place] ?[address] ?   ?  ? ? ? ? ? ?Radiology Studies: ?No results found. ? ? ? ? ? ?Scheduled Meds: ? [MAR Hold] aspirin EC  81 mg Oral Daily  ? [MAR Hold] atorvastatin  80 mg Oral q1800  ? [MAR Hold] insulin aspart  0-15 U

## 2022-02-20 NOTE — Progress Notes (Signed)
Inpatient Diabetes Program Recommendations ? ?AACE/ADA: New Consensus Statement on Inpatient Glycemic Control (2015) ? ?Target Ranges:  Prepandial:   less than 140 mg/dL ?     Peak postprandial:   less than 180 mg/dL (1-2 hours) ?     Critically ill patients:  140 - 180 mg/dL  ? ?Lab Results  ?Component Value Date  ? GLUCAP 312 (H) 02/20/2022  ? HGBA1C 11.3 (H) 02/17/2022  ? ? ?Review of Glycemic Control ? Latest Reference Range & Units 02/19/22 16:38 02/19/22 21:21 02/20/22 07:48  ?Glucose-Capillary 70 - 99 mg/dL 227 (H) 317 (H) 312 (H)  ? ?Diabetes history: DM 2 ?Outpatient Diabetes medications: Toujeo 60 units QHS, Humalog 10 units TID, Trulicity [id number] mg Weekly ?Current orders for Inpatient glycemic control: Semglee 40 units QD, Novolog 0-15 units TID and 0-5 QHS, Novolog 5 units TID ? ?Inpatient Diabetes Program Recommendations:   ? ?Please consider increasing Semglee to 50 units daily.  Needs Semglee dose today.   ? ?Thanks,  ?Adah Perl, RN, BC-ADM ?Inpatient Diabetes Coordinator ?Pager [phone]  (8a-5p) ? ? ?

## 2022-02-20 NOTE — Progress Notes (Addendum)
Pharmacy Antibiotic Note ? ?Austin [name] is a 66 y.o. male admitted on 02/15/2022 with cellulitis.  Pharmacy has been consulted for vancomycin and cefepime dosing. Blood cultures negative so far. ? ?S/p aortogram and angioplasty for iliac artery. He has been on vanc/ceftriaxone/metronidazole since 5/13. Wbc wnl, AF. ID is on board.  ? ?VP 35, VT 15>>AUC 577 ? ?Plan: ?Change vancomycin to 1250 mg IV q12h>>AUC 480 ?Continue cefepime 2 grams IV every 8 hours ?Repeat levels as needed ? ?Height: 6\' 4"  (193 cm) ?Height: 6\' 4"  (193 cm) ?Weight: 100.7 kg (222 lb 0.1 oz) ?IBW/kg (Calculated) : 86.8 ?IBW/kg (Calculated): 59.2 kg  ? ?Temp (24hrs), Avg:98.1 ?F (36.7 ?C), Min:97.5 ?F (36.4 ?C), Max:98.6 ?F (37 ?C) ? ?Recent Labs  ?Lab 02/15/22 ?2130 02/16/22 ?0239 02/16/22 ?0530 02/17/22 ?0052 02/18/22 ?0146 02/19/22 ?0135 02/20/22 ?0222 02/20/22 ?1036  ?WBC  --   --  13.6* 8.5 6.5 7.6 8.7  --   ?CREATININE  --   --  1.04 0.82 0.94 0.83 0.86  --   ?LATICACIDVEN 2.0* 2.6*  --  1.5  --   --   --   --   ?[place]  --   --   --   --   --   --   --  15  ?VANCOPEAK  --   --   --   --   --   --  72  --   ? ?  ?Estimated Creatinine Clearance: 103.7 mL/min (by C-G formula based on SCr of 0.86 mg/dL).   ? ?No Known Allergies ? ?Antimicrobials this admission: ?Ceftriaxone 5/12 x 1 ?Vancomycin 5/12 >>  ?Cefepime 5/13 >> ?Flagyl 5/17>> ? ?Microbiology results: ?5/12 BCx: ngF ?MRSA PCR neg ? ?Onnie Boer, PharmD, BCIDP, AAHIVP, CPP ?Infectious Disease Pharmacist ?02/20/2022 5:01 PM ? ? ? ? ?

## 2022-02-20 NOTE — Progress Notes (Signed)
[facility] [place] ? ?Subjective  - no complaints. ? ? ?Objective ?129/68 ?(!) 51 ?98.2 ?F (36.8 ?C) (Oral) ?(!) 23 ?98% ? ?Intake/Output Summary (Last 24 hours) at 02/20/2022 1058 ?Last data filed at 02/20/2022 0446 ?Gross per 24 hour  ?Intake 800 ml  ?Output 1100 ml  ?Net -300 ml  ? ? ?Open wound right foot ? ?Laboratory ?Lab Results: ?Recent Labs  ?  02/19/22 ?0135 02/20/22 ?0222  ?WBC 7.6 8.7  ?HGB 13.0 13.3  ?HCT 38.2* 40.7  ?PLT 181 204  ? ?BMET ?Recent Labs  ?  02/19/22 ?0135 02/20/22 ?0222  ?NA 136 136  ?K 4.0 4.1  ?CL 109 106  ?CO2 19* 23  ?GLUCOSE 260* 324*  ?BUN 13 11  ?CREATININE 0.83 0.86  ?CALCIUM 8.8* 8.8*  ? ? ?COAG ?Lab Results  ?Component Value Date  ? INR 1.3 (H) 02/15/2022  ? INR 1.2 03/31/2021  ? INR 1.10 10/28/2018  ? ?No results found for: PTT ? ?Assessment/Planning: ? ?Plan aortogram, lower extremity arteriogram, possible intervention with focus right leg for CLI with tissue loss. ABI from January 2023 was 0.77 and dampended monophasic on the right with TP 36.  Discussed this is inaderquate for wound healing.  ? ?Cephus Shelling ?02/20/2022 ?10:58 AM ?-- ? ? ?

## 2022-02-20 NOTE — Progress Notes (Signed)
Pt admitted to rm 17 from cath lab. The L groin site started bleeding. Stop the bleeding at 1620 pm. Bed rest end till 2020pm. Initiated tele. CHG wipe given. VSS. Call bell within reach.  ? ?Lavenia Atlas, RN ? ?

## 2022-02-20 NOTE — Progress Notes (Signed)
Femoral Sheath Removal ?Start Time 1515 ?End Time 1545 ?Sheath removed from left groin. Manual pressure held for 20 min. VVS stable throughout. Patient educated.  ? ?Kathleene Hazel RN ?

## 2022-02-20 NOTE — Progress Notes (Signed)
Received verbal order from MD for admistered 40 units of lantus.  ? ?Lavenia Atlas, RN ? ?

## 2022-02-21 ENCOUNTER — Inpatient Hospital Stay (HOSPITAL_COMMUNITY): Payer: Medicare Other

## 2022-02-21 ENCOUNTER — Encounter (HOSPITAL_COMMUNITY): Payer: Self-pay | Admitting: Vascular Surgery

## 2022-02-21 DIAGNOSIS — Z01810 Encounter for preprocedural cardiovascular examination: Secondary | ICD-10-CM

## 2022-02-21 DIAGNOSIS — I70244 Atherosclerosis of native arteries of left leg with ulceration of heel and midfoot: Secondary | ICD-10-CM | POA: Diagnosis not present

## 2022-02-21 DIAGNOSIS — E11621 Type 2 diabetes mellitus with foot ulcer: Secondary | ICD-10-CM | POA: Diagnosis not present

## 2022-02-21 DIAGNOSIS — I482 Chronic atrial fibrillation, unspecified: Secondary | ICD-10-CM | POA: Diagnosis not present

## 2022-02-21 DIAGNOSIS — L97513 Non-pressure chronic ulcer of other part of right foot with necrosis of muscle: Secondary | ICD-10-CM | POA: Diagnosis not present

## 2022-02-21 LAB — CBC
HCT: 40.8 % (ref 39.0–52.0)
Hemoglobin: 13.5 g/dL (ref 13.0–17.0)
MCH: 27.7 pg (ref 26.0–34.0)
MCHC: 33.1 g/dL (ref 30.0–36.0)
MCV: 83.8 fL (ref 80.0–100.0)
Platelets: 226 10*3/uL (ref 150–400)
RBC: 4.87 MIL/uL (ref 4.22–5.81)
RDW: 14.7 % (ref 11.5–15.5)
WBC: 10.4 10*3/uL (ref 4.0–10.5)
nRBC: 0 % (ref 0.0–0.2)

## 2022-02-21 LAB — GLUCOSE, CAPILLARY
Glucose-Capillary: 286 mg/dL — ABNORMAL HIGH (ref 70–99)
Glucose-Capillary: 307 mg/dL — ABNORMAL HIGH (ref 70–99)
Glucose-Capillary: 223 mg/dL — ABNORMAL HIGH (ref 70–99)
Glucose-Capillary: 256 mg/dL — ABNORMAL HIGH (ref 70–99)

## 2022-02-21 LAB — LIPID PANEL
Cholesterol: 76 mg/dL (ref 0–200)
HDL: 13 mg/dL — ABNORMAL LOW (ref >40)
LDL Cholesterol: 9 mg/dL (ref 0–99)
Total CHOL/HDL Ratio: 5.8 RATIO
Triglycerides: 272 mg/dL — ABNORMAL HIGH (ref <150)
VLDL: 54 mg/dL — ABNORMAL HIGH (ref 0–40)

## 2022-02-21 LAB — BASIC METABOLIC PANEL
Anion gap: 8 (ref 5–15)
BUN: 11 mg/dL (ref 8–23)
CO2: 22 mmol/L (ref 22–32)
Calcium: 8.7 mg/dL — ABNORMAL LOW (ref 8.9–10.3)
Chloride: 107 mmol/L (ref 98–111)
Creatinine, Ser: 0.74 mg/dL (ref 0.61–1.24)
GFR, Estimated: >60 mL/min (ref >60)
Glucose, Bld: 345 mg/dL — ABNORMAL HIGH (ref 70–99)
Potassium: 4.2 mmol/L (ref 3.5–5.1)
Sodium: 137 mmol/L (ref 135–145)

## 2022-02-21 LAB — MAGNESIUM: Magnesium: 1.8 mg/dL (ref 1.7–2.4)

## 2022-02-21 MED ORDER — INSULIN GLARGINE-YFGN 100 UNIT/ML ~~LOC~~ SOLN
20.0000 [IU] | Freq: Two times a day (BID) | SUBCUTANEOUS | Status: DC
  Administered 2022-02-21 – 2022-02-25 (×8): 20 [IU] via SUBCUTANEOUS
  Filled 2022-02-21 (×12): qty 0.2

## 2022-02-21 MED ORDER — ENOXAPARIN SODIUM 100 MG/ML IJ SOSY
100.0000 mg | PREFILLED_SYRINGE | Freq: Two times a day (BID) | INTRAMUSCULAR | Status: AC
  Administered 2022-02-21 – 2022-02-24 (×6): 100 mg via SUBCUTANEOUS
  Filled 2022-02-21 (×6): qty 1

## 2022-02-21 MED ORDER — PANTOPRAZOLE SODIUM 40 MG PO TBEC
40.0000 mg | DELAYED_RELEASE_TABLET | Freq: Every day | ORAL | Status: DC
Start: 2022-02-21 — End: 2022-03-08
  Administered 2022-02-21 – 2022-03-08 (×15): 40 mg via ORAL
  Filled 2022-02-21 (×15): qty 1

## 2022-02-21 NOTE — Progress Notes (Signed)
  Progress Note    02/21/2022 8:30 AM 1 Day Post-Op  Subjective: No complaints   Vitals:   02/21/22 0416 02/21/22 0736  BP: (!) 115/50 107/77  Pulse: 86 71  Resp: 11 20  Temp: 98 F (36.7 C) 97.8 F (36.6 C)  SpO2: 98% 94%   Physical Exam: Lungs:  non labored Incisions:  L groin cath site without hematoma Neurologic: A&O  CBC    Component Value Date/Time   WBC 10.4 02/21/2022 0120   RBC 4.87 02/21/2022 0120   HGB 13.5 02/21/2022 0120   HGB 14.1 11/17/2013 0621   HCT 40.8 02/21/2022 0120   HCT 41.0 02/16/2022 0608   PLT 226 02/21/2022 0120   PLT 218 11/17/2013 0621   MCV 83.8 02/21/2022 0120   MCV 86 11/17/2013 0621   MCH 27.7 02/21/2022 0120   MCHC 33.1 02/21/2022 0120   RDW 14.7 02/21/2022 0120   RDW 13.6 11/17/2013 0621   LYMPHSABS 2.2 02/16/2022 0530   LYMPHSABS 2.4 11/17/2013 0621   MONOABS 1.3 (H) 02/16/2022 0530   MONOABS 0.9 11/17/2013 0621   EOSABS 0.0 02/16/2022 0530   EOSABS 0.1 11/17/2013 0621   BASOSABS 0.0 02/16/2022 0530   BASOSABS 0.0 11/17/2013 0621    BMET    Component Value Date/Time   NA 137 02/21/2022 0120   NA 135 (L) 11/17/2013 0621   K 4.2 02/21/2022 0120   K 4.3 11/17/2013 0621   CL 107 02/21/2022 0120   CL 99 11/17/2013 0621   CO2 22 02/21/2022 0120   CO2 31 11/17/2013 0621   GLUCOSE 345 (H) 02/21/2022 0120   GLUCOSE 172 (H) 11/17/2013 0621   BUN 11 02/21/2022 0120   BUN 10 11/17/2013 0621   CREATININE 0.74 02/21/2022 0120   CREATININE 0.85 11/17/2013 0621   CALCIUM 8.7 (L) 02/21/2022 0120   CALCIUM 9.6 11/17/2013 0621   GFRNONAA >60 02/21/2022 0120   GFRNONAA >60 11/17/2013 0621   GFRAA >60 01/10/2020 0808   GFRAA >60 11/17/2013 0621    INR    Component Value Date/Time   INR 1.3 (H) 02/15/2022 2051     Intake/Output Summary (Last 24 hours) at 02/21/2022 0830 Last data filed at 02/21/2022 0734 Gross per 24 hour  Intake 340 ml  Output 2000 ml  Net -1660 ml     Assessment/Plan:  66 y.o. male is s/p R  iliac stent 1 Day Post-Op   Right iliac artery was successfully treated with a stent however patient did have a flush occlusion of the SFA.  He will be considered for right leg bypass graft.  Vein mapping has been ordered.  He is tentatively scheduled for surgery on Monday.  Left groin catheterization site is without hematoma.   Dagoberto Ligas, PA-C [facility] [phone] 02/21/2022 8:30 AM

## 2022-02-21 NOTE — Progress Notes (Signed)
ANTICOAGULATION CONSULT NOTE - Initial Consult  Pharmacy Consult for enoxaparin Indication: atrial fibrillation  No Known Allergies  Patient Measurements: Height: 6\' 4"  (193 cm) Weight: 103.9 kg (229 lb 0.9 oz) IBW/kg (Calculated) : 86.8  Vital Signs: Temp: 97.6 F (36.4 C) (05/18 1216) Temp Source: Oral (05/18 1216) BP: 112/56 (05/18 1216) Pulse Rate: 68 (05/18 1216)  Labs: Recent Labs    02/19/22 0135 02/20/22 0222 02/21/22 0120  HGB 13.0 13.3 13.5  HCT 38.2* 40.7 40.8  PLT 181 204 226  CREATININE 0.83 0.86 0.74  TROPONINIHS 4  --   --     Estimated Creatinine Clearance: 111.5 mL/min (by C-G formula based on SCr of 0.74 mg/dL).   Medical History: Past Medical History:  Diagnosis Date   Coronary artery disease    Diabetes (HCC)    DVT (deep venous thrombosis) (HCC)    Gangrene of left foot (HCC) 02/09/2018   GERD (gastroesophageal reflux disease)    Hyperlipidemia    Hypertension    Peripheral vascular disease (HCC)     Assessment: 27 YOM presenting with DFI, hx of afib on Eliquis PTA with last dose administered here 5/14, on hold for bypass possibly Monday.    Goal of Therapy:  Anti-Xa level 0.6-1 units/ml 4hrs after LMWH dose given Monitor platelets by anticoagulation protocol: Yes   Plan:  Enoxaparin 1mg /kg (100mg ) SQ every 12 hours Monitor renal function, CBC, s/s bleeding F/u ability to restart Eliquis   Monday, PharmD Clinical Pharmacist ED Pharmacist Phone # [phone] 02/21/2022 2:12 PM

## 2022-02-21 NOTE — Consult Note (Signed)
WOC Nurse Consult Note: Reason for Consult: foot ulcer Wound type: foot ulcer (see note from L. McNichol on 5/14) This patient is being followed by Vascular and Podiatry. Please follow orders for wound care previously put in on 5/14 Direct all further questions to Vascular or Podiatry.  Thank you for the consult. WOC nurse will not follow at this time.   Please re-consult the WOC team if needed.  Renaldo Reel Katrinka Blazing, MSN, RN, CMSRN, Angus Seller, [facility] Wound Treatment Associate Pager [phone]

## 2022-02-21 NOTE — Progress Notes (Signed)
Mobility Specialist Progress Note   02/21/22 1243  Mobility  Activity Transferred from chair to bed  Level of Assistance Minimal assist, patient does 75% or more  Assistive Device None  Activity Response Tolerated well  $Mobility charge 1 Mobility   RN requesting assistance to get pt from chair to bed for vein mapping. Laterally scooted on left side of bed to get back in w/ minA of RLE.  Pt presenting w/ good upper body strength to move most of body weight on there own. Tx'd w/o fault and left in supine. RN left in room.   Frederico Hamman Mobility Specialist Phone Number [phone]'

## 2022-02-21 NOTE — Progress Notes (Signed)
PHARMACIST LIPID MONITORING   Austin [name] is a 66 y.o. male admitted on 02/15/2022 presenting with DFI and limb ischemia.  Pharmacy has been consulted to optimize lipid-lowering therapy with the indication of secondary prevention for clinical ASCVD.  Recent Labs:  Lipid Panel (last 6 months):   Lab Results  Component Value Date   CHOL 76 02/21/2022   TRIG 272 (H) 02/21/2022   HDL 13 (L) 02/21/2022   CHOLHDL 5.8 02/21/2022   VLDL 54 (H) 02/21/2022   LDLCALC 9 02/21/2022    Hepatic function panel (last 6 months):   Lab Results  Component Value Date   AST 16 02/16/2022   ALT 16 02/16/2022   ALKPHOS 64 02/16/2022   BILITOT 1.2 02/16/2022    SCr (since admission):   Serum creatinine: 0.74 mg/dL 46/65/99 3570 Estimated creatinine clearance: 111.5 mL/min  Current therapy and lipid therapy tolerance Current lipid-lowering therapy: Atorvastatin 80mg  Previous lipid-lowering therapies (if applicable): n/a Documented or reported allergies or intolerances to lipid-lowering therapies (if applicable): n/a  Assessment:   LDL controlled on high intensity statin  Plan:    1.Statin intensity (high intensity recommended for all patients regardless of the LDL):  No statin changes. The patient is already on a high intensity statin.  2.Add ezetimibe (if any one of the following):   Not indicated at this time.  3.Refer to lipid clinic:   No  4.Follow-up with:  Cardiology provider - None  5.Follow-up labs after discharge:  No changes in lipid therapy, repeat a lipid panel in one year.       , PharmD 02/21/2022, 7:08 AM

## 2022-02-21 NOTE — Progress Notes (Signed)
VASCULAR LAB    Lower extremity vein mapping has been performed.  See CV proc for preliminary results.   Kailan Carmen, RVT 02/21/2022, 1:04 PM

## 2022-02-21 NOTE — Progress Notes (Signed)
PROGRESS NOTE    Austin [name]  [id number] DOB: Jul 14, 1956 DOA: 02/15/2022 PCP: Theotis Burrow, MD   Brief Narrative:  66 year old with history of DM2, HTN, PVD, A-fib on Eliquis, HTN, status post left BKA came to the ED after falling off the couch due to weakness and change in mental status.  Upon admission patient was noted to be in sepsis secondary to likely diabetic foot ulcer.  Upon admission patient was seen by neurology team, MRI brain was negative.  MRI was positive for right foot fifth digit osteomyelitis.  Podiatry, infectious disease and vascular team were consulted.  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  Concerns of osteomyelitis -Continue IV vancomycin and cefepime. - MRI foot shows digital fist metatarsal osteomyelitis with surrounding muscular edema -Podiatry, VVS and infectious disease has been consulted.  Appreciate input -12/11/20 Patient had PCI and balloon angioplasty of right lower extremity, stent placement of left iliac.  -Status post right iliac artery stent 02/20/2022, he will still need right leg bypass, tentative planning for MAC Monday, meanwhile continue with broad-spectrum antibiotics pain, Flagyl and vancomycin  Diabetic foot ulcer associated with type 2  diabetes mellitus ([place]) Type 2 diabetes mellitus with diabetic peripheral angiopathy without gangrene ([place]) - Hemoglobin A1c 11.3 on outpatient Toujeo and Trulicity?  Versus Levemir? - Sliding scale and Accu-Cheks - We will change his Semglee to 20 units twice daily and uptitrate as needed..   Acute metabolic encephalopathy This is resolved.  MRI brain showed remote infarct.  Seen by neurology team.  TSH and ammonia is normal  Atrial fibrillation, chronic (HCC) Eliquis on hold for anticipated procedures, will start on full dose Lovenox.  Status post below-knee amputation (HCC) - left side Chronic. Stable.  Essential hypertension Continue cozaar  DVT prophylaxis: Eliquis was held, will start on Lovenox treatment dose for A-fib. Code Status: Full code Family Communication:    Status is: Inpatient Remains inpatient appropriate because: Maintain hospital stay for IV antibiotics, vascular intervention tomorrow with likely amputation by podiatry thereafter.  Subjective:  Significant events overnight as discussed with staff, he denies any complaints,  Examination:  Awake Alert, Oriented X 3, No new F.N deficits, Normal affect Symmetrical Chest wall movement, Good air movement bilaterally, CTAB RRR,No Gallops,Rubs or new Murmurs, No Parasternal Heave +ve B.Sounds, Abd Soft, No tenderness, No rebound - guarding or rigidity. Right foot diabetic ulcer with surrounding erythema, left lower extremity BKA   Objective: Vitals:   02/21/22 0008 02/21/22 0416 02/21/22 0736 02/21/22 1216  BP: 118/79 (!) 115/50 107/77 (!) 112/56  Pulse: 63 86 71 68  Resp: _0 Temp: 98.2 F (36.8 C) 98 F (36.7 C) 97.8 F (36.6 C) 97.6 F (36.4 C)  TempSrc: Oral Oral Oral Oral  SpO2: 98% 98% 94% 99%  Weight:  103.9 kg    Height:  Intake/Output Summary (Last 24 hours) at 02/21/2022 1342 Last data filed at 02/21/2022 1000 Gross per 24 hour  Intake 580 ml  Output 2000 ml  Net -1420  ml   Filed Weights   02/19/22 0500 02/20/22 0400 02/21/22 0416  Weight: 102.4 kg 100.7 kg 103.9 kg     Data Reviewed:   CBC: Recent Labs  Lab 02/15/22 2051 02/15/22 2055 02/16/22 0530 02/16/22 0608 02/17/22 0052 02/18/22 0146 02/19/22 0135 02/20/22 0222 02/21/22 0120  WBC 16.9*  --  13.6*  --  8.5 6.5 7.6 8.7 10.4  NEUTROABS 13.7*  --  10.1*  --   --   --   --   --   --   HGB 14.4   < > 14.5  --  13.2 12.6* 13.0 13.3 13.5  HCT 43.6   < > 43.5   < > 38.2* 38.0* 38.2* 40.7 40.8  MCV 84.0  --  83.7  --  82.7 84.4 83.2 84.8 83.8  PLT 164  --  167  --  127* 159 181 204 226   < > = values in this interval not displayed.   Basic Metabolic Panel: Recent Labs  Lab 02/17/22 0052 02/18/22 0146 02/19/22 0135 02/20/22 0222 02/21/22 0120  NA 135 135 136 136 137  K 4.4 4.3 4.0 4.1 4.2  CL 104 106 109 106 107  CO2 21* 21* 19* 23 22  GLUCOSE 243* 366* 260* 324* 345*  BUN _0 CREATININE 0.82 0.94 0.83 0.86 0.74  CALCIUM 8.4* 8.6* 8.8* 8.8* 8.7*  MG 1.8 1.7 1.7 1.7 1.8   GFR: Estimated Creatinine Clearance: 111.5 mL/min (by C-G formula based on SCr of 0.74 mg/dL). Liver Function Tests: Recent Labs  Lab 02/15/22 2051 02/16/22 0530  AST 19 16  ALT 18 16  ALKPHOS 63 64  BILITOT 0.9 1.2  PROT 7.0 6.9  ALBUMIN 3.1* 2.7*   No results for input(s): LIPASE, AMYLASE in the last 168 hours. Recent Labs  Lab 02/16/22 0530  AMMONIA 13   Coagulation Profile: Recent Labs  Lab 02/15/22 2051  INR 1.3*   Cardiac Enzymes: No results for input(s): CKTOTAL, CKMB, CKMBINDEX, TROPONINI in the last 168 hours. BNP (last 3 results) No results for input(s): PROBNP in the last 8760 hours. HbA1C: No results for input(s): HGBA1C in the last 72 hours.  CBG: Recent Labs  Lab 02/20/22 2045 02/20/22 2220 02/20/22 2256 02/21/22 0610 02/21/22 1210  GLUCAP 287* 403* 370* 286* 307*   Lipid Profile: Recent Labs    02/21/22 0120  CHOL 76  HDL 13*  LDLCALC 9  TRIG  272*  CHOLHDL 5.8   Thyroid Function Tests: No results for input(s): TSH, T4TOTAL, FREET4, T3FREE, THYROIDAB in the last 72 hours.  Anemia Panel: No results for input(s): VITAMINB12, FOLATE, FERRITIN, TIBC, IRON, RETICCTPCT in the last 72 hours.  Sepsis Labs: Recent Labs  Lab 02/15/22 2130 02/16/22 0239 02/16/22 0530 02/17/22 0052  PROCALCITON  --   --  0.34 0.19  LATICACIDVEN 2.0* 2.6*  --  1.5    Recent Results (from the past 240 hour(s))  Culture, blood (single)     Status: None   Collection Time: 02/15/22  9:27 PM   Specimen: BLOOD  Result Value Ref Range Status   Specimen Description BLOOD LEFT ANTECUBITAL  Final   Special Requests   Final    BOTTLES DRAWN AEROBIC AND ANAEROBIC Blood Culture results may not be optimal due to an excessive  volume of blood received in culture bottles   Culture   Final    NO GROWTH 5 DAYS Performed at [facility], [place] [address] [place], [place] [address]    Report Status 02/20/2022 FINAL  Final  MRSA Next Gen by PCR, Nasal     Status: None   Collection Time: 02/19/22  8:50 AM   Specimen: Nasal Mucosa; Nasal Swab  Result Value Ref Range Status   MRSA by PCR Next Gen NOT DETECTED NOT DETECTED Final    Comment: (NOTE) The GeneXpert MRSA Assay (FDA approved for NASAL specimens only), is one component of a comprehensive MRSA colonization surveillance program. It is not intended to diagnose MRSA infection nor to guide or monitor treatment for MRSA infections. Test performance is not FDA approved in patients less than 5 years old. Performed at [facility], [place] [address], [place], [place] [address]   Surgical PCR screen     Status: None   Collection Time: 02/20/22  4:12 AM   Specimen: Nasal Mucosa; Nasal Swab  Result Value Ref Range Status   MRSA, PCR NEGATIVE NEGATIVE Final   Staphylococcus aureus NEGATIVE NEGATIVE Final    Comment: (NOTE) The Xpert SA Assay (FDA approved for NASAL specimens in patients 51 years of  age and older), is one component of a comprehensive surveillance program. It is not intended to diagnose infection nor to guide or monitor treatment. Performed at [facility], [place] [address], [place], [place] [address]          Radiology Studies: PERIPHERAL VASCULAR CATHETERIZATION  Result Date: 02/20/2022 Patient name: MAREK NGHIEM       MRN: [id number]        DOB: 08-27-1956          Sex: male  02/20/2022 Pre-operative Diagnosis: Critical limb ischemia of the right lower extremity with tissue loss and foot wound Post-operative diagnosis:  Same Surgeon:  Marty Heck, MD Procedure Performed: 1.  Ultrasound-guided access left common femoral artery 2.  Aortogram with catheter selection of aorta 3.  Right lower extremity arteriogram with selection of second-order branches 4.  Right external iliac artery angioplasty with stent placement (7 mm x 40 mm self-expanding Innova postdilated with a 6 mm Mustang) 5.  61 minutes of monitored moderate conscious sedation time  Indications: 66 year old male seen in consultation over the weekend with right foot wound.  He has undergone multiple previous lower extremity interventions at [facility].  Ultimately he was seen with a dampened monophasic waveform in the right foot and severely diminished toe pressure of 36 inadequate for wound healing.  He presents today for aortogram, lower extremity arteriogram, possible intervention after risks and benefits discussed.  Findings:  Aortogram showed both renal arteries are patent as well as the infrarenal aorta.  On the right which is the side of interest, his common iliac artery stent and external iliac stent are both widely patent.  There was about a 60% stenosis in the right external iliac artery distal to his existing stents.  The common femoral is patent with profunda runoff.  There is a flush SFA occlusion on the right and his previous SFA stents in the proximal SFA are occluded.  He does reconstitute a  tibioperoneal trunk with runoff in the peroneal and posterior tibial also retrograde filling of the anterior tibial off the TP trunk with runoff into the foot.  Posterior tibial occludes above the ankle.  Ultimately the right external iliac stenosis was  stented with a 7 mm self-expanding Innova with no residual stenosis.             Procedure:  The patient was identified in the holding area and taken to room 8.  The patient was then placed supine on the table and prepped and draped in the usual sterile fashion.  A time out was called.  Patient did receive moderate sedation with Versed and fentanyl.  All of his vital signs were monitored including heart rate, respiratory rate, blood pressure and oxygenation.  I was present for the entire sedation.  Ultrasound was used to evaluate the left common femoral artery.  It was patent .  A digital ultrasound image was acquired.  A micropuncture needle was used to access the left common femoral artery under ultrasound guidance.  An 018 wire was advanced without resistance and a micropuncture sheath was placed.  The 018 wire was removed and a benson wire was placed.  The micropuncture sheath was exchanged for a 5 french sheath.  An omniflush catheter was advanced over the wire to the level of L-1.  An abdominal angiogram was obtained.  Next, using the omniflush catheter and a benson wire, the aortic bifurcation was crossed and the catheter was placed into the right external iliac artery and right runoff was obtained.  Pertinent findings are noted above.  After evaluating the images, it looked like he needs a right lower extremity tibial bypass.  He has a very diminished femoral pulse on the right.  I elected to stent the 60% right external iliac stenosis for better inflow to support his bypass.  I upsized to a long 6 [place] Ansell sheath over the aortic bifurcation in the left groin with a Rosen wire.  Patient was given 100 units/kg IV heparin.  I then got dedicated imaging  with oblique orientation of the C arm to mark the right external iliac stenosis.  The right external iliac stenosis was stented with a 7 mm x 40 mm self-expanding Innova postdilated with a 6 mm Mustang.  Excellent results with no residual stenosis.  Sheath was pulled back over the aortic bifurcation and he was taken to holding for sheath pull.   Plan: Right external iliac artery 60% stenosis was stented with a 7 mm x 40 mm self-expanding Innova with excellent results.  Patient will need vein mapping and right leg tibial bypass given flush right SFA occlusion.              Marty Heck, MD [facility] Office: [phone]        Scheduled Meds:  aspirin EC  81 mg Oral Daily   atorvastatin  80 mg Oral q1800   insulin aspart  0-15 Units Subcutaneous TID WC   insulin aspart  0-5 Units Subcutaneous QHS   insulin aspart  5 Units Subcutaneous TID WC   insulin glargine-yfgn  30 Units Subcutaneous Daily   losartan  25 mg Oral Daily   metroNIDAZOLE  500 mg Oral Q12H   mupirocin ointment  1 application. Nasal BID   sodium chloride flush  3 mL Intravenous Once   sodium chloride flush  3 mL Intravenous Q12H   Continuous Infusions:  sodium chloride 100 mL/hr at 02/21/22 0526   sodium chloride     cefTRIAXone (ROCEPHIN)  IV 2 g (02/21/22 0847)   vancomycin 1,250 mg (02/21/22 0531)     LOS: 5 days       Phillips Climes, MD [facility]  If 7PM-7AM, please contact night-coverage  02/21/2022, 1:42 PM

## 2022-02-21 NOTE — Progress Notes (Signed)
[facility]  Subjective  -no complaints.   Objective (!) 112/56 68 97.6 F (36.4 C) (Oral) 18 99%  Intake/Output Summary (Last 24 hours) at 02/21/2022 1303 Last data filed at 02/21/2022 1000 Gross per 24 hour  Intake 580 ml  Output 2000 ml  Net -1420 ml    Right femoral pulse palpable Right foot owund Left femoral pulse no hematoma  Laboratory Lab Results: Recent Labs    02/20/22 0222 02/21/22 0120  WBC 8.7 10.4  HGB 13.3 13.5  HCT 40.7 40.8  PLT 204 226   BMET Recent Labs    02/20/22 0222 02/21/22 0120  NA 136 137  K 4.1 4.2  CL 106 107  CO2 23 22  GLUCOSE 324* 345*  BUN 11 11  CREATININE 0.86 0.74  CALCIUM 8.8* 8.7*    COAG Lab Results  Component Value Date   INR 1.3 (H) 02/15/2022   INR 1.2 03/31/2021   INR 1.10 10/28/2018   No results found for: PTT  Assessment/Planning:  Postop day 1 status post aortogram with lower extremity arteriogram with placement of right external iliac stent.  He subsequently needs a right common femoral to tibial bypass given his SFA stents are occluded with flush occlusion on the right. Vein mapping ordered today.  I tentatively posted him for bypass on Monday.  This is for limb salvage.  Please continue aspirin statin.  We will hold Plavix given pending surgery.  Marty Heck 02/21/2022 1:03 PM --

## 2022-02-21 NOTE — Evaluation (Signed)
Occupational Therapy Evaluation Patient Details Name: Austin [name] MRN: [id number] DOB: August 11, 1956 Today's Date: 02/21/2022   History of Present Illness Pt is a 66 y.o. 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 Brain MRI negative for acute injury. S/p RLE arteriogram and angioplasty of external iliac artery on 5/17. Plan for vascular tibial bypass on 5/22. PMH includes PVD s/p L BKA (10/2018), DM, DVT, HTN, HLD.   Clinical Impression   Pt. Was cooperative during evaluation and motivated to work with OT. Pt. States he is not allowed to weight bear on R LE until after sx on Monday. He states he is not able to don prosthetic leg on L secondary to wound on stump. Pt. Is scooting bed to chair. Pt. States he has a drop arm commode at home and he can scoot to it. Pt. To be followed by acute ot.      Recommendations for follow up therapy are one component of a multi-disciplinary discharge planning process, led by the attending physician.  Recommendations may be updated based on patient status, additional functional criteria and insurance authorization.   Follow Up Recommendations  Home health OT (Pt. wants home health for ot)    Assistance Recommended at Discharge Frequent or constant Supervision/Assistance  Patient can return home with the following A lot of help with walking and/or transfers;A lot of help with bathing/dressing/bathroom    Functional Status Assessment  Patient has had a recent decline in their functional status and demonstrates the ability to make significant improvements in function in a reasonable and predictable amount of time.  Equipment Recommendations  None recommended by OT    Recommendations for Other Services       Precautions / Restrictions Precautions Precautions: Fall;Other (comment) Precaution Comments: H/o L BKA (prosthetic in room) Restrictions Weight Bearing Restrictions: No       Mobility Bed Mobility                    Transfers                          Balance                                           ADL either performed or assessed with clinical judgement   ADL Overall ADL's : Needs assistance/impaired Eating/Feeding: Independent   Grooming: Wash/dry hands;Wash/dry face;Set up;Sitting   Upper Body Bathing: Set up;Sitting   Lower Body Bathing: Maximal assistance;Sitting/lateral leans   Upper Body Dressing : Minimal assistance;Sitting   Lower Body Dressing: Total assistance;Sitting/lateral leans               Functional mobility during ADLs:  (Pt. is able to scott transfer and stated the md does not want him to put weight through leg.) General ADL Comments: Pt. was sitting in recliner for adls.     Vision Baseline Vision/History:  (pnt weats glasses but they broke. Pt. reports he had cataract sx.) Ability to See in Adequate Light: 0 Adequate Patient Visual Report: No change from baseline Vision Assessment?: No apparent visual deficits     Perception     Praxis      Pertinent Vitals/Pain Pain Assessment Pain Assessment: No/denies pain     Hand Dominance Right   Extremity/Trunk Assessment  Upper Extremity Assessment Upper Extremity Assessment: Overall WFL for tasks assessed   Lower Extremity Assessment Lower Extremity Assessment: RLE deficits/detail;LLE deficits/detail RLE Deficits / Details: s/p RLE revascularization; hip and knee functionally at least 3/5 strength RLE Sensation: history of peripheral neuropathy LLE Deficits / Details: h/o L BKA; hip and knee functionally at least 3/5 strength LLE Sensation: history of peripheral neuropathy       Communication Communication Communication: No difficulties   Cognition Arousal/Alertness: Awake/alert Behavior During Therapy: WFL for tasks assessed/performed Overall Cognitive Status: Within Functional Limits for tasks assessed                                  General Comments: oriented times 4     General Comments  educ re: precautions (R foot WB for transfers only), positioning, activity recommendations, importance of OOB    Exercises     Shoulder Instructions      Home Living Family/patient expects to be discharged to:: Private residence Living Arrangements: Non-relatives/Friends Available Help at Discharge: Family;Friend(s);Available 24 hours/day Type of Home: House Home Access: Level entry     Home Layout: One level     Bathroom Shower/Tub: Tub/shower unit (does bird baths at sink)   Bathroom Toilet: Handicapped height Bathroom Accessibility: Yes How Accessible: Accessible via wheelchair;Accessible via walker Home Equipment: [place] (2 wheels);Wheelchair - manual;Tub bench   Additional Comments: lives with friend; will also have assist from brother and sister at d/c      Prior Functioning/Environment Prior Level of Function : Independent/Modified Independent             Mobility Comments: Mod indep ambulating with LLE prosthetic and RW, typically uses manual w/c ADLs Comments: Does bird baths at sink from w/c        OT Problem List: Decreased activity tolerance;Decreased knowledge of use of DME or AE;Decreased knowledge of precautions      OT Treatment/Interventions: Self-care/ADL training;DME and/or AE instruction;Therapeutic activities;Patient/family education    OT Goals(Current goals can be found in the care plan section) Acute Rehab OT Goals Patient Stated Goal: go home OT Goal Formulation: With patient Time For Goal Achievement: 03/07/22 Potential to Achieve Goals: Good  OT Frequency: Min 2X/week    Co-evaluation              AM-PAC OT "6 Clicks" Daily Activity     Outcome Measure Help from another person eating meals?: None Help from another person taking care of personal grooming?: A Little Help from another person toileting, which includes  using toliet, bedpan, or urinal?: A Lot Help from another person bathing (including washing, rinsing, drying)?: A Lot Help from another person to put on and taking off regular upper body clothing?: A Little Help from another person to put on and taking off regular lower body clothing?: A Lot 6 Click Score: 16   End of Session Nurse Communication:  (ok therapy)  Activity Tolerance: Patient tolerated treatment well Patient left: in chair  OT Visit Diagnosis: Unsteadiness on feet (R26.81)                Time: [id number] OT Time Calculation (min): 33 min Charges:  OT General Charges $OT Visit: 1 Visit OT Evaluation $OT Eval Moderate Complexity: 1 Mod  Terrius Gentile OT/L   Garo Heidelberg 02/21/2022, 11:54 AM

## 2022-02-21 NOTE — Evaluation (Signed)
Physical Therapy Evaluation Patient Details Name: Austin [name] MRN: [id number] DOB: [birth year] Today's Date: 02/21/2022  History of Present Illness  Pt is a 66 y.o. 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 Brain MRI negative for acute injury. S/p RLE arteriogram and angioplasty of external iliac artery on 5/17. Plan for vascular tibial bypass on 5/22. PMH includes PVD s/p L BKA (10/2018), DM, DVT, HTN, HLD.   Clinical Impression  Pt presents with an overall decrease in functional mobility secondary to above. PTA, pt mod indep with primary use of wheelchair, also ambulating short distances with RW and LLE prosthetic; lives with friend, also with family nearby to provide increased assist at d/c. Initiated educ on precautions, positioning, and importance of mobility. Today, pt able to perform lateral scoot transfer to recliner, requiring up to minA for mobility. Pt would benefit from continued acute PT services to maximize functional mobility and independence prior to d/c home.      Recommendations for follow up therapy are one component of a multi-disciplinary discharge planning process, led by the attending physician.  Recommendations may be updated based on patient status, additional functional criteria and insurance authorization.  Follow Up Recommendations Home health PT (to confirm post-op additional RLE intervention 5/22)    Assistance Recommended at Discharge Intermittent Supervision/Assistance  Patient can return home with the following  A little help with walking and/or transfers;A little help with bathing/dressing/bathroom;Assistance with cooking/housework;Assist for transportation;Help with stairs or ramp for entrance    Equipment Recommendations None recommended by PT  Recommendations for Other Services       Functional Status Assessment Patient has had a recent decline in their functional status and  demonstrates the ability to make significant improvements in function in a reasonable and predictable amount of time.     Precautions / Restrictions Precautions Precautions: Fall;Other (comment) Precaution Comments: H/o L BKA (prosthetic in room) Restrictions Weight Bearing Restrictions: No      Mobility  Bed Mobility Overal bed mobility: Needs Assistance Bed Mobility: Supine to Sit     Supine to sit: Min assist, HOB elevated     General bed mobility comments: MinA for HHA to elevate trunk, pt scooting self to EOB without assist    Transfers Overall transfer level: Needs assistance Equipment used: None Transfers: Bed to chair/wheelchair/BSC            Lateral/Scoot Transfers: Supervision General transfer comment: assist for set-up, pt able to perform lateral scoot transfer from bed to drop-arm recliner towards R-side, supervision for safety    Ambulation/Gait                  Stairs            Wheelchair Mobility    Modified Rankin (Stroke Patients Only)       Balance Overall balance assessment: Needs assistance Sitting-balance support: No upper extremity supported Sitting balance-Leahy Scale: Good                                       Pertinent Vitals/Pain Pain Assessment Pain Assessment: Faces Faces Pain Scale: Hurts little more Pain Location: R foot Pain Descriptors / Indicators: Discomfort, Guarding Pain Intervention(s): Monitored during session, Limited activity within patient's tolerance    Home Living Family/patient expects to be discharged to:: Private residence Living Arrangements: Non-relatives/Friends Available Help at  Discharge: Family;Friend(s);Available 24 hours/day Type of Home: House Home Access: Level entry       Home Layout: One level Home Equipment: Conservation officer, nature (2 wheels);Wheelchair - manual;Tub bench Additional Comments: lives with friend; will also have assist from brother and sister at d/c     Prior Function Prior Level of Function : Independent/Modified Independent             Mobility Comments: Mod indep ambulating with LLE prosthetic and RW, typically uses manual w/c ADLs Comments: Does bird baths at sink from w/c     Hand Dominance        Extremity/Trunk Assessment   Upper Extremity Assessment Upper Extremity Assessment: Overall WFL for tasks assessed    Lower Extremity Assessment Lower Extremity Assessment: RLE deficits/detail;LLE deficits/detail RLE Deficits / Details: s/p RLE revascularization; hip and knee functionally at least 3/5 strength RLE Sensation: history of peripheral neuropathy LLE Deficits / Details: h/o L BKA; hip and knee functionally at least 3/5 strength LLE Sensation: history of peripheral neuropathy       Communication   Communication: No difficulties  Cognition Arousal/Alertness: Awake/alert Behavior During Therapy: WFL for tasks assessed/performed Overall Cognitive Status: Within Functional Limits for tasks assessed                                          General Comments General comments (skin integrity, edema, etc.): educ re: precautions (R foot WB for transfers only), positioning, activity recommendations, importance of OOB    Exercises     Assessment/Plan    PT Assessment Patient needs continued PT services  PT Problem List Decreased strength;Decreased activity tolerance;Decreased balance;Decreased mobility;Decreased knowledge of use of DME;Decreased knowledge of precautions;Pain       PT Treatment Interventions DME instruction;Gait training;Functional mobility training;Therapeutic activities;Balance training;Patient/family education;Wheelchair mobility training;Therapeutic exercise    PT Goals (Current goals can be found in the Care Plan section)  Acute Rehab PT Goals Patient Stated Goal: return home PT Goal Formulation: With patient Time For Goal Achievement: 03/07/22 Potential to Achieve  Goals: Good    Frequency Min 3X/week     Co-evaluation               AM-PAC PT "6 Clicks" Mobility  Outcome Measure Help needed turning from your back to your side while in a flat bed without using bedrails?: None Help needed moving from lying on your back to sitting on the side of a flat bed without using bedrails?: A Little Help needed moving to and from a bed to a chair (including a wheelchair)?: A Little Help needed standing up from a chair using your arms (e.g., wheelchair or bedside chair)?: A Little Help needed to walk in hospital room?: Total Help needed climbing 3-5 steps with a railing? : Total 6 Click Score: 15    End of Session   Activity Tolerance: Patient tolerated treatment well Patient left: in chair;with call bell/phone within reach;with chair alarm set Nurse Communication: Mobility status PT Visit Diagnosis: Other abnormalities of gait and mobility (R26.89);Pain    Time: [id number] PT Time Calculation (min) (ACUTE ONLY): 17 min   Charges:   PT Evaluation $PT Eval Moderate Complexity: 1 Mod        Mabeline Caras, PT, DPT Acute Rehabilitation Services  Pager [phone] Office [place] 02/21/2022, 10:52 AM

## 2022-02-22 DIAGNOSIS — I70244 Atherosclerosis of native arteries of left leg with ulceration of heel and midfoot: Secondary | ICD-10-CM | POA: Diagnosis not present

## 2022-02-22 DIAGNOSIS — I482 Chronic atrial fibrillation, unspecified: Secondary | ICD-10-CM | POA: Diagnosis not present

## 2022-02-22 DIAGNOSIS — E11621 Type 2 diabetes mellitus with foot ulcer: Secondary | ICD-10-CM | POA: Diagnosis not present

## 2022-02-22 DIAGNOSIS — L97513 Non-pressure chronic ulcer of other part of right foot with necrosis of muscle: Secondary | ICD-10-CM | POA: Diagnosis not present

## 2022-02-22 LAB — GLUCOSE, CAPILLARY
Glucose-Capillary: 298 mg/dL — ABNORMAL HIGH (ref 70–99)
Glucose-Capillary: 307 mg/dL — ABNORMAL HIGH (ref 70–99)
Glucose-Capillary: 185 mg/dL — ABNORMAL HIGH (ref 70–99)
Glucose-Capillary: 201 mg/dL — ABNORMAL HIGH (ref 70–99)

## 2022-02-22 LAB — MAGNESIUM: Magnesium: 1.8 mg/dL (ref 1.7–2.4)

## 2022-02-22 LAB — BASIC METABOLIC PANEL
Anion gap: 7 (ref 5–15)
BUN: 12 mg/dL (ref 8–23)
CO2: 24 mmol/L (ref 22–32)
Calcium: 8.9 mg/dL (ref 8.9–10.3)
Chloride: 107 mmol/L (ref 98–111)
Creatinine, Ser: 0.85 mg/dL (ref 0.61–1.24)
GFR, Estimated: >60 mL/min (ref >60)
Glucose, Bld: 307 mg/dL — ABNORMAL HIGH (ref 70–99)
Potassium: 4.1 mmol/L (ref 3.5–5.1)
Sodium: 138 mmol/L (ref 135–145)

## 2022-02-22 LAB — CBC
HCT: 38.8 % — ABNORMAL LOW (ref 39.0–52.0)
Hemoglobin: 13 g/dL (ref 13.0–17.0)
MCH: 27.7 pg (ref 26.0–34.0)
MCHC: 33.5 g/dL (ref 30.0–36.0)
MCV: 82.6 fL (ref 80.0–100.0)
Platelets: 233 10*3/uL (ref 150–400)
RBC: 4.7 MIL/uL (ref 4.22–5.81)
RDW: 14.7 % (ref 11.5–15.5)
WBC: 9.9 10*3/uL (ref 4.0–10.5)
nRBC: 0 % (ref 0.0–0.2)

## 2022-02-22 MED ORDER — POLYETHYLENE GLYCOL 3350 17 G PO PACK
17.0000 g | PACK | Freq: Every day | ORAL | Status: DC
  Administered 2022-02-26 – 2022-03-08 (×3): 17 g via ORAL
  Filled 2022-02-22 (×6): qty 1

## 2022-02-22 NOTE — Progress Notes (Signed)
[facility]  Subjective  -no complaints.   Objective 122/64 61 97.7 F (36.5 C) (Oral) 18 99%  Intake/Output Summary (Last 24 hours) at 02/22/2022 0848 Last data filed at 02/22/2022 0655 Gross per 24 hour  Intake 3372.92 ml  Output 2100 ml  Net 1272.92 ml    Right femoral pulse palpable Right foot wound as pictured    Laboratory Lab Results: Recent Labs    02/21/22 0120 02/22/22 0108  WBC 10.4 9.9  HGB 13.5 13.0  HCT 40.8 38.8*  PLT 226 233   BMET Recent Labs    02/21/22 0120 02/22/22 0108  NA 137 138  K 4.2 4.1  CL 107 107  CO2 22 24  GLUCOSE 345* 307*  BUN 11 12  CREATININE 0.74 0.85  CALCIUM 8.7* 8.9    COAG Lab Results  Component Value Date   INR 1.3 (H) 02/15/2022   INR 1.2 03/31/2021   INR 1.10 10/28/2018   No results found for: PTT  Assessment/Planning:  66 year old male underwent right external iliac artery stenting earlier this week and now has a better femoral pulse.  He is scheduled for right lower extremity femoral to tibial bypass on Monday with me for critical limb ischemia with tissue loss.  His SFA stents placed at [facility] are occluded and he has a flush SFA occlusion.  I have again discussed bypass surgery with him today as well as indications with goal of limb preservation.  He does appear to have a usable vein on vein mapping.  My partner Dr. Stanford Breed will see him this weekend.  Marty Heck 02/22/2022 8:48 AM --

## 2022-02-22 NOTE — Progress Notes (Signed)
ID Brief Note  Remains afebrile, no leukocytosis Plan for RLE femoral to tibial bypass on Monday noted Plan for amputation of rt 5th toe and distal metatarsal by Podiatry after Vascular optimization  Blood cx 5/12 no growth in 5 days   Continue Vancomycin, ceftriaxone and metronidazole for now  Dr Renold Don covering the weekend and new ID team to assume care starting Monday.   Odette Fraction, MD Infectious Disease Physician [facility] [address] [place], [place] [address] Phone: [phone]  Fax: [phone]]

## 2022-02-22 NOTE — Progress Notes (Signed)
PROGRESS NOTE    RAJVIR ERNSTER  [id number] DOB: Jul 06, 1956 DOA: 02/15/2022 PCP: Theotis Burrow, MD   Brief Narrative:   66 year old with history of DM2, HTN, PVD, A-fib on Eliquis, HTN, status post left BKA came to the ED after falling off the couch due to weakness and change in mental status.  Upon admission patient was noted to be in sepsis secondary to likely diabetic foot ulcer.  Upon admission patient was seen by neurology team, MRI brain was negative.  MRI was positive for right foot fifth digit osteomyelitis.  Podiatry, infectious disease and vascular team were consulted.  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  Concerns of osteomyelitis -Continue IV vancomycin and cefepime. - MRI foot shows digital fist metatarsal osteomyelitis with surrounding muscular edema -Podiatry, VVS and infectious disease has been consulted.  Appreciate input -12/11/20 Patient had PCI and balloon angioplasty of right lower extremity, stent placement of left iliac.  -Status post right iliac artery stent 02/20/2022, he will still need right leg bypass, tentative planning for MAC Monday, meanwhile continue with broad-spectrum antibiotics pain, Flagyl and vancomycin  Diabetic foot ulcer associated with type 2  diabetes mellitus ([place]) Type 2 diabetes mellitus with diabetic peripheral angiopathy without gangrene ([place]) - Hemoglobin A1c 11.3 on outpatient Toujeo and Trulicity?  Versus Levemir? - Sliding scale and Accu-Cheks - We will change his Semglee to 20 units twice daily and uptitrate as needed..  Acute metabolic encephalopathy This is resolved.  MRI brain showed remote infarct.  Seen by neurology team.  TSH and ammonia is normal  Atrial fibrillation, chronic (HCC) Eliquis on hold for anticipated procedures, started on full dose Lovenox in anticipation for surgery next week, will hold Lovenox Sunday evening.  Status post below-knee amputation (HCC) - left side Chronic. Stable.  Essential hypertension Continue cozaar  DVT prophylaxis: Eliquis was held, will start on Lovenox treatment dose for A-fib. Code Status: Full code Family Communication:    Status is: Inpatient Remains inpatient appropriate because: Maintain hospital stay for IV antibiotics, vascular intervention tomorrow with likely amputation by podiatry thereafter.  Subjective:  No Significant events overnight as discussed with staff, he denies any complaints,  Examination:  Awake Alert, Oriented X 3, No new F.N deficits, Normal affect Symmetrical Chest wall movement, Good air movement bilaterally, CTAB RRR,No Gallops,Rubs or new Murmurs, No Parasternal Heave +ve B.Sounds, Abd Soft, No tenderness, No rebound - guarding or rigidity. Right foot diabetic ulcer with surrounding erythema, left lower extremity BKA   Objective: Vitals:   02/22/22 0421 02/22/22 0645 02/22/22 0833 02/22/22 1141  BP: 121/75  122/64 114/66  Pulse: 80  61 73  Resp: _0 Temp: 97.7 F (36.5 C)  97.7 F (36.5 C) 97.7 F (36.5 C)  TempSrc: Oral  Oral Oral  SpO2: 95%  99% 98%  Weight:  101 kg    Height:        Intake/Output Summary (Last 24 hours) at 02/22/2022 1301 Last data filed at 02/22/2022 1249 Gross per 24 hour  Intake 3852.92  ml  Output 2650 ml  Net 1202.92 ml   Filed Weights   02/20/22 0400 02/21/22 0416 02/22/22 0645  Weight: 100.7 kg 103.9 kg 101 kg     Data Reviewed:   CBC: Recent Labs  Lab 02/15/22 2051 02/15/22 2055 02/16/22 0530 02/16/22 0608 02/18/22 0146 02/19/22 0135 02/20/22 0222 02/21/22 0120 02/22/22 0108  WBC 16.9*  --  13.6*   < > 6.5 7.6 8.7 10.4 9.9  NEUTROABS 13.7*  --  10.1*  --   --   --   --   --   --   HGB 14.4   < > 14.5   < > 12.6* 13.0 13.3 13.5 13.0  HCT 43.6   < > 43.5   < > 38.0* 38.2* 40.7 40.8 38.8*  MCV 84.0  --  83.7   < > 84.4 83.2 84.8 83.8 82.6  PLT 164  --  167   < > 159 181 204 226 233   < > = values in this interval not displayed.   Basic Metabolic Panel: Recent Labs  Lab 02/18/22 0146 02/19/22 0135 02/20/22 0222 02/21/22 0120 02/22/22 0108  NA 135 136 136 137 138  K 4.3 4.0 4.1 4.2 4.1  CL 106 109 106 107 107  CO2 21* 19* _0 GLUCOSE 366* 260* 324* 345* 307*  BUN _1 CREATININE 0.94 0.83 0.86 0.74 0.85  CALCIUM 8.6* 8.8* 8.8* 8.7* 8.9  MG 1.7 1.7 1.7 1.8 1.8   GFR: Estimated Creatinine Clearance: 105 mL/min (by C-G formula based on SCr of 0.85 mg/dL). Liver Function Tests: Recent Labs  Lab 02/15/22 2051 02/16/22 0530  AST 19 16  ALT 18 16  ALKPHOS 63 64  BILITOT 0.9 1.2  PROT 7.0 6.9  ALBUMIN 3.1* 2.7*   No results for input(s): LIPASE, AMYLASE in the last 168 hours. Recent Labs  Lab 02/16/22 0530  AMMONIA 13   Coagulation Profile: Recent Labs  Lab 02/15/22 2051  INR 1.3*   Cardiac Enzymes: No results for input(s): CKTOTAL, CKMB, CKMBINDEX, TROPONINI in the last 168 hours. BNP (last 3 results) No results for input(s): PROBNP in the last 8760 hours. HbA1C: No results for input(s): HGBA1C in the last 72 hours.  CBG: Recent Labs  Lab 02/21/22 1210 02/21/22 1650 02/21/22 2155 02/22/22 0647 02/22/22 1138  GLUCAP 307* 223* 256* 298* 307*   Lipid Profile: Recent Labs    02/21/22 0120  CHOL 76   HDL 13*  LDLCALC 9  TRIG 272*  CHOLHDL 5.8   Thyroid Function Tests: No results for input(s): TSH, T4TOTAL, FREET4, T3FREE, THYROIDAB in the last 72 hours.  Anemia Panel: No results for input(s): VITAMINB12, FOLATE, FERRITIN, TIBC, IRON, RETICCTPCT in the last 72 hours.  Sepsis Labs: Recent Labs  Lab 02/15/22 2130 02/16/22 0239 02/16/22 0530 02/17/22 0052  PROCALCITON  --   --  0.34 0.19  LATICACIDVEN 2.0* 2.6*  --  1.5    Recent Results (from the past 240 hour(s))  Culture, blood (single)     Status: None   Collection Time: 02/15/22  9:27 PM   Specimen: BLOOD  Result Value Ref Range Status   Specimen Description BLOOD LEFT ANTECUBITAL  Final   Special Requests   Final  BOTTLES DRAWN AEROBIC AND ANAEROBIC Blood Culture results may not be optimal due to an excessive volume of blood received in culture bottles   Culture   Final    NO GROWTH 5 DAYS Performed at [facility], [place] [address] [place], [place] [address]    Report Status 02/20/2022 FINAL  Final  MRSA Next Gen by PCR, Nasal     Status: None   Collection Time: 02/19/22  8:50 AM   Specimen: Nasal Mucosa; Nasal Swab  Result Value Ref Range Status   MRSA by PCR Next Gen NOT DETECTED NOT DETECTED Final    Comment: (NOTE) The GeneXpert MRSA Assay (FDA approved for NASAL specimens only), is one component of a comprehensive MRSA colonization surveillance program. It is not intended to diagnose MRSA infection nor to guide or monitor treatment for MRSA infections. Test performance is not FDA approved in patients less than 48 years old. Performed at [facility], [place] [address], [place], [place] [address]   Surgical PCR screen     Status: None   Collection Time: 02/20/22  4:12 AM   Specimen: Nasal Mucosa; Nasal Swab  Result Value Ref Range Status   MRSA, PCR NEGATIVE NEGATIVE Final   Staphylococcus aureus NEGATIVE NEGATIVE Final    Comment: (NOTE) The Xpert SA Assay (FDA approved for NASAL  specimens in patients 51 years of age and older), is one component of a comprehensive surveillance program. It is not intended to diagnose infection nor to guide or monitor treatment. Performed at [facility], [place] [address], [place], [place] [address]          Radiology Studies: VAS [place] LOWER EXTREMITY SAPHENOUS VEIN MAPPING  Result Date: 02/21/2022 LOWER EXTREMITY VEIN MAPPING Patient Name:  ANFERNEE PESCHKE  Date of Exam:   02/21/2022 Medical Rec #: [id number]         Accession #:    [id number] Date of Birth: Feb 11, 1956         Patient Gender: M Patient Age:   24 years Exam Location:  [facility] Procedure:      VAS [place] LOWER EXTREMITY SAPHENOUS VEIN MAPPING Referring Phys: Monica Martinez --------------------------------------------------------------------------------  Indications:        Pre-op History:            History of PAD; patient is pre-operative for lower extremity                     bypass graft. Risk Factors:       PAD. Other Risk Factors: Left BKA.  Comparison Study: No prior study Performing Technologist: Sharion Dove RVS  Examination Guidelines: A complete evaluation includes B-mode imaging, spectral Doppler, color Doppler, and power Doppler as needed of all accessible portions of each vessel. Bilateral testing is considered an integral part of a complete examination. Limited examinations for reoccurring indications may be performed as noted. +----------------+----------+----------------------+---------------+-----------+ RT Diameter (cm)    RT             GSV            LT Diameter  LT Findings                  Findings                            (cm)                  +----------------+----------+----------------------+---------------+-----------+  0.57                    Saphenofemoral         0.30                                                   Junction                                   +----------------+----------+----------------------+---------------+-----------+       0.58                    Proximal thigh         0.16                  +----------------+----------+----------------------+---------------+-----------+       0.50                      Mid thigh            0.21                  +----------------+----------+----------------------+---------------+-----------+    0.43/0.17    branching      Distal thigh          0.29                  +----------------+----------+----------------------+---------------+-----------+       0.51                         Knee              0.24                  +----------------+----------+----------------------+---------------+-----------+  3.70/0.21/0.19 branching       Prox calf                          BKA     +----------------+----------+----------------------+---------------+-----------+    0.42/0.173   branching        Mid calf                          BKA     +----------------+----------+----------------------+---------------+-----------+    0.29/0.20    branching      Distal calf                         BKA     +----------------+----------+----------------------+---------------+-----------+    0.19/0.174   branching         Ankle                            BKA     +----------------+----------+----------------------+---------------+-----------+    Preliminary         Scheduled Meds:  aspirin EC  81 mg Oral Daily   atorvastatin  80 mg Oral q1800   enoxaparin (LOVENOX) injection  100 mg Subcutaneous Q12H   insulin aspart  0-15 Units Subcutaneous TID WC   insulin aspart  0-5 Units Subcutaneous QHS   insulin aspart  5 Units Subcutaneous TID WC   insulin glargine-yfgn  20 Units Subcutaneous BID  losartan  25 mg Oral Daily   metroNIDAZOLE  500 mg Oral Q12H   mupirocin ointment  1 application. Nasal BID   pantoprazole  40 mg Oral Daily   polyethylene glycol  17 g Oral Daily   sodium chloride  flush  3 mL Intravenous Once   sodium chloride flush  3 mL Intravenous Q12H   Continuous Infusions:  sodium chloride 50 mL/hr at 02/21/22 2114   sodium chloride     cefTRIAXone (ROCEPHIN)  IV 2 g (02/22/22 1014)   vancomycin 1,250 mg (02/22/22 0534)     LOS: 6 days       Phillips Climes, MD [facility]  If 7PM-7AM, please contact night-coverage  02/22/2022, 1:01 PM

## 2022-02-22 NOTE — Progress Notes (Signed)
   02/22/22 1500  Mobility  Activity Refused mobility   Declined for unspecified. Will f/u as time permits

## 2022-02-22 NOTE — Progress Notes (Signed)
  Transition of Care [facility]) Screening Note   Patient Details  Name: Austin [name] Date of Birth: Dec 14, 1955   Transition of Care [facility]) CM/SW Contact:    Darrold Span, RN Phone Number: 02/22/2022, 4:23 PM    Transition of Care Department [facility]) has reviewed patient and note plan for right lower extremity femoral to tibial bypass on Monday. We will continue to monitor patient advancement through interdisciplinary progression rounds. If new patient transition needs arise, please place a TOC consult.

## 2022-02-23 DIAGNOSIS — I482 Chronic atrial fibrillation, unspecified: Secondary | ICD-10-CM | POA: Diagnosis not present

## 2022-02-23 DIAGNOSIS — M869 Osteomyelitis, unspecified: Secondary | ICD-10-CM | POA: Diagnosis not present

## 2022-02-23 DIAGNOSIS — L97513 Non-pressure chronic ulcer of other part of right foot with necrosis of muscle: Secondary | ICD-10-CM | POA: Diagnosis not present

## 2022-02-23 DIAGNOSIS — E11621 Type 2 diabetes mellitus with foot ulcer: Secondary | ICD-10-CM | POA: Diagnosis not present

## 2022-02-23 DIAGNOSIS — I70244 Atherosclerosis of native arteries of left leg with ulceration of heel and midfoot: Secondary | ICD-10-CM | POA: Diagnosis not present

## 2022-02-23 LAB — BASIC METABOLIC PANEL
Anion gap: 7 (ref 5–15)
BUN: 9 mg/dL (ref 8–23)
CO2: 24 mmol/L (ref 22–32)
Calcium: 9.1 mg/dL (ref 8.9–10.3)
Chloride: 110 mmol/L (ref 98–111)
Creatinine, Ser: 0.75 mg/dL (ref 0.61–1.24)
GFR, Estimated: >60 mL/min (ref >60)
Glucose, Bld: 153 mg/dL — ABNORMAL HIGH (ref 70–99)
Potassium: 3.9 mmol/L (ref 3.5–5.1)
Sodium: 141 mmol/L (ref 135–145)

## 2022-02-23 LAB — CBC
HCT: 38.8 % — ABNORMAL LOW (ref 39.0–52.0)
Hemoglobin: 13.4 g/dL (ref 13.0–17.0)
MCH: 28.3 pg (ref 26.0–34.0)
MCHC: 34.5 g/dL (ref 30.0–36.0)
MCV: 81.9 fL (ref 80.0–100.0)
Platelets: 254 10*3/uL (ref 150–400)
RBC: 4.74 MIL/uL (ref 4.22–5.81)
RDW: 14.9 % (ref 11.5–15.5)
WBC: 10.6 10*3/uL — ABNORMAL HIGH (ref 4.0–10.5)
nRBC: 0 % (ref 0.0–0.2)

## 2022-02-23 LAB — GLUCOSE, CAPILLARY
Glucose-Capillary: 174 mg/dL — ABNORMAL HIGH (ref 70–99)
Glucose-Capillary: 212 mg/dL — ABNORMAL HIGH (ref 70–99)
Glucose-Capillary: 219 mg/dL — ABNORMAL HIGH (ref 70–99)
Glucose-Capillary: 195 mg/dL — ABNORMAL HIGH (ref 70–99)

## 2022-02-23 LAB — MAGNESIUM: Magnesium: 1.7 mg/dL (ref 1.7–2.4)

## 2022-02-23 LAB — VANCOMYCIN, TROUGH: Vancomycin Tr: 12 ug/mL — ABNORMAL LOW (ref 15–20)

## 2022-02-23 LAB — VANCOMYCIN, PEAK: Vancomycin Pk: 27 ug/mL — ABNORMAL LOW (ref 30–40)

## 2022-02-23 MED ORDER — MAGNESIUM SULFATE 2 GM/50ML IV SOLN
2.0000 g | Freq: Once | INTRAVENOUS | Status: AC
  Administered 2022-02-23: 2 g via INTRAVENOUS
  Filled 2022-02-23: qty 50

## 2022-02-23 NOTE — Progress Notes (Signed)
PROGRESS NOTE    Austin [name]  [id number]:[id number] DOB: 1956/04/22 DOA: 02/15/2022 PCP: Preston Fleeting, MD   Brief Narrative:   66 year old with history of DM2, HTN, PVD, A-fib on Eliquis, HTN, status post left BKA came to the ED after falling off the couch due to weakness and change in mental status.  Upon admission patient was noted to be in sepsis secondary to likely diabetic foot ulcer.  Upon admission patient was seen by neurology team, MRI brain was negative.  MRI was positive for right foot fifth digit osteomyelitis.  Podiatry, infectious disease and vascular team were consulted.  Plans for vascular intervention possibly 5/17, Eliquis on hold at their request   Assessment & Plan:  Principal Problem:   Diabetic foot ulcer associated with type 2 diabetes mellitus (HCC) Active Problems:   Acute metabolic encephalopathy   Sepsis with acute organ dysfunction without septic shock (HCC)   Essential hypertension   Type 2 diabetes mellitus with diabetic peripheral angiopathy without gangrene (HCC)   Status post below-knee amputation (HCC) - left side   Atrial fibrillation, chronic (HCC)   Osteomyelitis (HCC)     Assessment and Plan:   Sepsis with acute organ dysfunction without septic shock (HCC) 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  Concerns of osteomyelitis -Continue IV vancomycin and cefepime. - MRI foot shows digital fist metatarsal osteomyelitis with surrounding muscular edema -Podiatry, VVS and infectious disease has been consulted.  Appreciate input -12/11/20 Patient had PCI and balloon angioplasty of right lower extremity, stent placement of left iliac.  -Vascular surgery input greatly appreciated, status post right iliac artery stent 02/20/2022, he will need right leg bypass, with tentative planning this coming Monday. -Continue with broad-spectrum antibiotics till amputation is performed.    Diabetic foot ulcer associated with type 2 diabetes mellitus (HCC) Type 2 diabetes mellitus with diabetic peripheral angiopathy without gangrene (HCC) - Hemoglobin A1c 11.3 on outpatient Toujeo and Trulicity?  Versus Levemir? - Sliding scale and Accu-Cheks - We will change his Semglee to 20 units twice daily and uptitrate as needed..  Acute metabolic encephalopathy This is resolved.  MRI brain showed remote infarct.  Seen by neurology team.  TSH and ammonia is normal  Atrial fibrillation, chronic (HCC) Eliquis on hold for anticipated procedures, started on full dose Lovenox in anticipation for surgery next week, will hold Lovenox Sunday evening.  Status post below-knee amputation (HCC) - left side Chronic. Stable.  Essential hypertension Continue cozaar  DVT prophylaxis: Eliquis was held, will start on Lovenox treatment dose for A-fib. Code Status: Full code Family Communication:    Status is: Inpatient Remains inpatient appropriate because: Maintain hospital stay for IV antibiotics, vascular intervention tomorrow with likely amputation by podiatry thereafter.  Subjective:  No Significant events overnight as discussed with staff, he denies any complaints,  Examination:  Awake Alert, Oriented X 3, No new F.N deficits, Normal affect Symmetrical Chest wall movement, Good air movement bilaterally, CTAB RRR,No Gallops,Rubs or new Murmurs, No Parasternal Heave +ve B.Sounds, Abd Soft, No tenderness, No rebound - guarding or rigidity. Right foot diabetic ulcer with surrounding erythema, left lower extremity BKA   Objective: Vitals:   02/22/22 2252 02/23/22 0310 02/23/22 0947 02/23/22 1146  BP: 114/70 116/86 122/70 104/68  Pulse: 73 71 70 63  Resp: 20 16 18 15   Temp: (!) 97.5 F (36.4 C) (!) 97.4 F (36.3 C) (!) 97.4 F (36.3 C) (!) 97.4 F (36.3 C)  TempSrc: Oral  Oral Oral Oral  SpO2: 97% 97% 97% 98%  Weight:  101.4 kg    Height:        Intake/Output Summary (Last  24 hours) at 02/23/2022 1308 Last data filed at 02/23/2022 1100 Gross per 24 hour  Intake 770.16 ml  Output 2775 ml  Net -2004.84 ml   Filed Weights   02/21/22 0416 02/22/22 0645 02/23/22 0310  Weight: 103.9 kg 101 kg 101.4 kg     Data Reviewed:   CBC: Recent Labs  Lab 02/19/22 0135 02/20/22 0222 02/21/22 0120 02/22/22 0108 02/23/22 0116  WBC 7.6 8.7 10.4 9.9 10.6*  HGB 13.0 13.3 13.5 13.0 13.4  HCT 38.2* 40.7 40.8 38.8* 38.8*  MCV 83.2 84.8 83.8 82.6 81.9  PLT 181 204 226 233 0000000   Basic Metabolic Panel: Recent Labs  Lab 02/19/22 0135 02/20/22 0222 02/21/22 0120 02/22/22 0108 02/23/22 0116  NA 136 136 137 138 141  K 4.0 4.1 4.2 4.1 3.9  CL 109 106 107 107 110  CO2 19* 23 22 24 24   GLUCOSE 260* 324* 345* 307* 153*  BUN 13 11 11 12 9   CREATININE 0.83 0.86 0.74 0.85 0.75  CALCIUM 8.8* 8.8* 8.7* 8.9 9.1  MG 1.7 1.7 1.8 1.8 1.7   GFR: Estimated Creatinine Clearance: 111.5 mL/min (by C-G formula based on SCr of 0.75 mg/dL). Liver Function Tests: No results for input(s): AST, ALT, ALKPHOS, BILITOT, PROT, ALBUMIN in the last 168 hours.  No results for input(s): LIPASE, AMYLASE in the last 168 hours. No results for input(s): AMMONIA in the last 168 hours.  Coagulation Profile: No results for input(s): INR, PROTIME in the last 168 hours.  Cardiac Enzymes: No results for input(s): CKTOTAL, CKMB, CKMBINDEX, TROPONINI in the last 168 hours. BNP (last 3 results) No results for input(s): PROBNP in the last 8760 hours. HbA1C: No results for input(s): HGBA1C in the last 72 hours.  CBG: Recent Labs  Lab 02/22/22 1138 02/22/22 1633 02/22/22 2121 02/23/22 0625 02/23/22 1148  GLUCAP 307* 185* 201* 174* 212*   Lipid Profile: Recent Labs    02/21/22 0120  CHOL 76  HDL 13*  LDLCALC 9  TRIG 272*  CHOLHDL 5.8   Thyroid Function Tests: No results for input(s): TSH, T4TOTAL, FREET4, T3FREE, THYROIDAB in the last 72 hours.  Anemia Panel: No results for  input(s): VITAMINB12, FOLATE, FERRITIN, TIBC, IRON, RETICCTPCT in the last 72 hours.  Sepsis Labs: Recent Labs  Lab 02/17/22 0052  PROCALCITON 0.19  LATICACIDVEN 1.5    Recent Results (from the past 240 hour(s))  Culture, blood (single)     Status: None   Collection Time: 02/15/22  9:27 PM   Specimen: BLOOD  Result Value Ref Range Status   Specimen Description BLOOD LEFT ANTECUBITAL  Final   Special Requests   Final    BOTTLES DRAWN AEROBIC AND ANAEROBIC Blood Culture results may not be optimal due to an excessive volume of blood received in culture bottles   Culture   Final    NO GROWTH 5 DAYS Performed at [facility], [place] [address], [place], [place] [address]    Report Status 02/20/2022 FINAL  Final  MRSA Next Gen by PCR, Nasal     Status: None   Collection Time: 02/19/22  8:50 AM   Specimen: Nasal Mucosa; Nasal Swab  Result Value Ref Range Status   MRSA by PCR Next Gen NOT DETECTED NOT DETECTED Final    Comment: (NOTE) The GeneXpert MRSA Assay (  FDA approved for NASAL specimens only), is one component of a comprehensive MRSA colonization surveillance program. It is not intended to diagnose MRSA infection nor to guide or monitor treatment for MRSA infections. Test performance is not FDA approved in patients less than 27 years old. Performed at [facility], [place] [address], [place], [place] [address]   Surgical PCR screen     Status: None   Collection Time: 02/20/22  4:12 AM   Specimen: Nasal Mucosa; Nasal Swab  Result Value Ref Range Status   MRSA, PCR NEGATIVE NEGATIVE Final   Staphylococcus aureus NEGATIVE NEGATIVE Final    Comment: (NOTE) The Xpert SA Assay (FDA approved for NASAL specimens in patients 65 years of age and older), is one component of a comprehensive surveillance program. It is not intended to diagnose infection nor to guide or monitor treatment. Performed at [facility], [place] [address], [place], [place] [address]           Radiology Studies: No results found.      Scheduled Meds:  aspirin EC  81 mg Oral Daily   atorvastatin  80 mg Oral q1800   enoxaparin (LOVENOX) injection  100 mg Subcutaneous Q12H   insulin aspart  0-15 Units Subcutaneous TID WC   insulin aspart  0-5 Units Subcutaneous QHS   insulin aspart  5 Units Subcutaneous TID WC   insulin glargine-yfgn  20 Units Subcutaneous BID   losartan  25 mg Oral Daily   metroNIDAZOLE  500 mg Oral Q12H   mupirocin ointment  1 application. Nasal BID   pantoprazole  40 mg Oral Daily   polyethylene glycol  17 g Oral Daily   sodium chloride flush  3 mL Intravenous Once   sodium chloride flush  3 mL Intravenous Q12H   Continuous Infusions:  sodium chloride 50 mL/hr at 02/22/22 1600   sodium chloride     cefTRIAXone (ROCEPHIN)  IV 2 g (02/23/22 0945)   vancomycin 1,250 mg (02/23/22 0500)     LOS: 7 days       Phillips Climes, MD [facility]  If 7PM-7AM, please contact night-coverage  02/23/2022, 1:08 PM

## 2022-02-23 NOTE — Progress Notes (Signed)
Pharmacy Antibiotic Note  Austin [name] is a 66 y.o. male admitted on 02/15/2022 with cellulitis.  Pharmacy has been consulted for vancomycin and cefepime dosing. Blood cultures negative so far. Scr remains stable <1 and patient afebrile.   S/p aortogram and angioplasty for iliac artery. He has been on vanc/ceftriaxone/metronidazole since 5/13. ID is on board.   5/20: VP 27, VT 12, cAUC 500  Plan: Continue vancomycin to 1250 mg IV q12h (cAUC 500) Continue cefepime 2 grams IV every 8 hours Repeat levels as needed  Height: 6\' 4"  (193 cm) Height: 6\' 4"  (193 cm) Weight: 101.4 kg (223 lb 8.7 oz) IBW/kg (Calculated) : 86.8 IBW/kg (Calculated): 59.2 kg   Temp (24hrs), Avg:97.4 F (36.3 C), Min:97.4 F (36.3 C), Max:97.5 F (36.4 C)  Recent Labs  Lab 02/17/22 0052 02/18/22 0146 02/19/22 0135 02/20/22 0222 02/20/22 1036 02/21/22 0120 02/22/22 0108 02/23/22 0116 02/23/22 0809 02/23/22 1758  WBC 8.5   < > 7.6 8.7  --  10.4 9.9 10.6*  --   --   CREATININE 0.82   < > 0.83 0.86  --  0.74 0.85 0.75  --   --   LATICACIDVEN 1.5  --   --   --   --   --   --   --   --   --   VANCOTROUGH  --   --   --   --  15  --   --   --   --  12*  VANCOPEAK  --   --   --  35  --   --   --   --  27*  --    < > = values in this interval not displayed.     Estimated Creatinine Clearance: 111.5 mL/min (by C-G formula based on SCr of 0.75 mg/dL).    No Known Allergies  Antimicrobials this admission: Ceftriaxone 5/12 x 1, 5/17>>  Vancomycin 5/12 >>  Cefepime 5/13 >> 5/16 Flagyl 5/17>>  Microbiology results: 5/12 BCx: ngF MRSA PCR neg  6/16, PharmD, MBA, MS Pharmacy Resident [phone] 02/23/2022 7:00 PM

## 2022-02-23 NOTE — Progress Notes (Addendum)
  Progress Note    02/23/2022 5:46 AM 3 Days Post-Op  Subjective:  no complaints   Vitals:   02/22/22 2252 02/23/22 0310  BP: 114/70 116/86  Pulse: 73 71  Resp: 20 16  Temp: (!) 97.5 F (36.4 C) (!) 97.4 F (36.3 C)  SpO2: 97% 97%   Physical Exam: Cardiac:  regular Lungs:  non labored Extremities:  left cf access site c/d/I without swelling or hematoma. Right plantar foot ulceration stable Abdomen:  soft Neurologic: alert and oriented  CBC    Component Value Date/Time   WBC 10.6 (H) 02/23/2022 0116   RBC 4.74 02/23/2022 0116   HGB 13.4 02/23/2022 0116   HGB 14.1 11/17/2013 0621   HCT 38.8 (L) 02/23/2022 0116   HCT 41.0 02/16/2022 0608   PLT 254 02/23/2022 0116   PLT 218 11/17/2013 0621   MCV 81.9 02/23/2022 0116   MCV 86 11/17/2013 0621   MCH 28.3 02/23/2022 0116   MCHC 34.5 02/23/2022 0116   RDW 14.9 02/23/2022 0116   RDW 13.6 11/17/2013 0621   LYMPHSABS 2.2 02/16/2022 0530   LYMPHSABS 2.4 11/17/2013 0621   MONOABS 1.3 (H) 02/16/2022 0530   MONOABS 0.9 11/17/2013 0621   EOSABS 0.0 02/16/2022 0530   EOSABS 0.1 11/17/2013 0621   BASOSABS 0.0 02/16/2022 0530   BASOSABS 0.0 11/17/2013 0621    BMET    Component Value Date/Time   NA 141 02/23/2022 0116   NA 135 (L) 11/17/2013 0621   K 3.9 02/23/2022 0116   K 4.3 11/17/2013 0621   CL 110 02/23/2022 0116   CL 99 11/17/2013 0621   CO2 24 02/23/2022 0116   CO2 31 11/17/2013 0621   GLUCOSE 153 (H) 02/23/2022 0116   GLUCOSE 172 (H) 11/17/2013 0621   BUN 9 02/23/2022 0116   BUN 10 11/17/2013 0621   CREATININE 0.75 02/23/2022 0116   CREATININE 0.85 11/17/2013 0621   CALCIUM 9.1 02/23/2022 0116   CALCIUM 9.6 11/17/2013 0621   GFRNONAA >60 02/23/2022 0116   GFRNONAA >60 11/17/2013 0621   GFRAA >60 01/10/2020 0808   GFRAA >60 11/17/2013 0621    INR    Component Value Date/Time   INR 1.3 (H) 02/15/2022 2051     Intake/Output Summary (Last 24 hours) at 02/23/2022 0546 Last data filed at 02/23/2022  0019 Gross per 24 hour  Intake 1800.16 ml  Output 2925 ml  Net -1124.84 ml     Assessment/Plan:  66 y.o. male is s/p Right EIA stenting 3 Days Post-Op   Right femoral pulse Left CF access site c/d/I without swelling or hematoma Has tissue loss on right foot. SFA stents occluded Plan is for right femoral to tibial vein bypass on Monday 5/22   Karoline Caldwell, [facility] [phone] 02/23/2022 5:46 AM  VASCULAR STAFF ADDENDUM: I have independently interviewed and examined the patient. I agree with the above.   Yevonne Aline. Stanford Breed, MD [facility] Phone Number: [phone] 02/23/2022 6:01 AM

## 2022-02-24 DIAGNOSIS — M869 Osteomyelitis, unspecified: Secondary | ICD-10-CM | POA: Diagnosis not present

## 2022-02-24 DIAGNOSIS — L97513 Non-pressure chronic ulcer of other part of right foot with necrosis of muscle: Secondary | ICD-10-CM | POA: Diagnosis not present

## 2022-02-24 DIAGNOSIS — E11621 Type 2 diabetes mellitus with foot ulcer: Secondary | ICD-10-CM | POA: Diagnosis not present

## 2022-02-24 LAB — GLUCOSE, CAPILLARY
Glucose-Capillary: 216 mg/dL — ABNORMAL HIGH (ref 70–99)
Glucose-Capillary: 225 mg/dL — ABNORMAL HIGH (ref 70–99)
Glucose-Capillary: 217 mg/dL — ABNORMAL HIGH (ref 70–99)
Glucose-Capillary: 236 mg/dL — ABNORMAL HIGH (ref 70–99)
Glucose-Capillary: 289 mg/dL — ABNORMAL HIGH (ref 70–99)

## 2022-02-24 NOTE — Progress Notes (Signed)
  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 Chestine Spore Reviewed surgery with patient and questions/ concerns addressed NPO after midnight Consent order placed   Graceann Congress, PA-C [facility] [phone] 02/24/2022 7:09 AM

## 2022-02-24 NOTE — Progress Notes (Signed)
Occupational Therapy Treatment Patient Details Name: Austin [name] MRN: [id number] DOB: 02/05/1956 Today's Date: 02/24/2022   History of present illness Pt is a 66 y.o. 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 Brain MRI negative for acute injury. S/p RLE arteriogram and angioplasty of external iliac artery on 5/17. Plan for vascular tibial bypass on 5/22. PMH includes PVD s/p L BKA (10/2018), DM, DVT, HTN, HLD.   OT comments  Pt up in chair upon arrival, wanting to remain in chair vs bed. Pt able to perform LB dressing with mod A using figure 4 technique with legs elevated in recliner. Pt performing UB/LB exercises in chair with demonstration, able to perform R/L weight shifting in chair in prep for lateral scoot transfers. Pt with good UB strength and mobility. Pt presenting with impairments listed below, will follow acutely. Continue to recommend HHOT at d/c.   Recommendations for follow up therapy are one component of a multi-disciplinary discharge planning process, led by the attending physician.  Recommendations may be updated based on patient status, additional functional criteria and insurance authorization.    Follow Up Recommendations  Home health OT    Assistance Recommended at Discharge Frequent or constant Supervision/Assistance  Patient can return home with the following  A lot of help with walking and/or transfers;A lot of help with bathing/dressing/bathroom   Equipment Recommendations  None recommended by OT    Recommendations for Other Services      Precautions / Restrictions Precautions Precautions: Fall;Other (comment) Precaution Comments: H/o L BKA (prosthetic in room) Restrictions Weight Bearing Restrictions: Yes Other Position/Activity Restrictions: WB for transfers only per podiatry       Mobility Bed Mobility               General bed mobility comments: up in chair upon  arrival    Transfers                   General transfer comment: simulated as pt wanting to stay up in chair     Balance Overall balance assessment: Needs assistance Sitting-balance support: No upper extremity supported Sitting balance-Leahy Scale: Good Sitting balance - Comments: can reach outside BOS without LOB                                   ADL either performed or assessed with clinical judgement   ADL Overall ADL's : Needs assistance/impaired                     Lower Body Dressing: Moderate assistance;Sitting/lateral leans Lower Body Dressing Details (indicate cue type and reason): donning sock with figure 4 in chair with legs elevated                    Extremity/Trunk Assessment Upper Extremity Assessment Upper Extremity Assessment: Overall WFL for tasks assessed   Lower Extremity Assessment Lower Extremity Assessment: Defer to PT evaluation        Vision   Vision Assessment?: No apparent visual deficits   Perception Perception Perception: Not tested   Praxis Praxis Praxis: Not tested    Cognition Arousal/Alertness: Awake/alert Behavior During Therapy: WFL for tasks assessed/performed Overall Cognitive Status: Within Functional Limits for tasks assessed  General Comments: pt able to recall surgery he is having tomorrow, follows all instructions without difficulty        Exercises Exercises: General Upper Extremity, General Lower Extremity, Other exercises General Exercises - Upper Extremity Shoulder Flexion: AROM, Both, 10 reps, Seated Elbow Flexion: AROM, Both, 10 reps, Seated Elbow Extension: AROM, Both, 10 reps, Seated Digit Composite Flexion: AROM, Both, 10 reps, Seated Composite Extension: AROM, Both, 10 reps, Seated General Exercises - Lower Extremity Ankle Circles/Pumps: AROM, Right, 10 reps, Seated Short Arc Quad: AROM, Both, 10 reps, Seated Other  Exercises Other Exercises: weight shifting to R/L side in chair x10    Shoulder Instructions       General Comments reviewed precautions with pt, VSS on RA    Pertinent Vitals/ Pain       Pain Assessment Pain Assessment: Faces Pain Score: 4  Faces Pain Scale: Hurts little more Pain Location: R foot Pain Descriptors / Indicators: Discomfort, Guarding Pain Intervention(s): Monitored during session  Home Living                                          Prior Functioning/Environment              Frequency  Min 2X/week        Progress Toward Goals  OT Goals(current goals can now be found in the care plan section)  Progress towards OT goals: Progressing toward goals  Acute Rehab OT Goals Patient Stated Goal: none stated OT Goal Formulation: With patient Time For Goal Achievement: 03/07/22 Potential to Achieve Goals: Good ADL Goals Pt Will Perform Upper Body Bathing: with set-up;sitting Pt Will Perform Lower Body Bathing: with set-up;sitting/lateral leans;sit to/from stand Pt Will Perform Upper Body Dressing: with set-up;sitting Pt Will Perform Lower Body Dressing: with set-up;sit to/from stand;sitting/lateral leans Pt Will Transfer to Toilet: squat pivot transfer;with supervision Pt Will Perform Toileting - Clothing Manipulation and hygiene: sitting/lateral leans;sit to/from stand;with supervision  Plan Discharge plan remains appropriate;Frequency remains appropriate    Co-evaluation                 AM-PAC OT "6 Clicks" Daily Activity     Outcome Measure   Help from another person eating meals?: None Help from another person taking care of personal grooming?: A Little Help from another person toileting, which includes using toliet, bedpan, or urinal?: A Lot Help from another person bathing (including washing, rinsing, drying)?: A Lot Help from another person to put on and taking off regular upper body clothing?: A Lot Help from  another person to put on and taking off regular lower body clothing?: A Lot 6 Click Score: 15    End of Session    OT Visit Diagnosis: Unsteadiness on feet (R26.81)   Activity Tolerance Patient tolerated treatment well   Patient Left in chair;with call bell/phone within reach   Nurse Communication Mobility status        Time: 1545-1601 OT Time Calculation (min): 16 min  Charges: OT General Charges $OT Visit: 1 Visit OT Treatments $Therapeutic Activity: 8-22 mins  Lynnda Child, OTD, OTR/L Acute Rehab [phone] - Bear 02/24/2022, 4:13 PM

## 2022-02-24 NOTE — Progress Notes (Signed)
PROGRESS NOTE    Austin [name]  [id number] DOB: 04-08-1956 DOA: 02/15/2022 PCP: Theotis Burrow, MD   Brief Narrative:   66 year old with history of DM2, HTN, PVD, A-fib on Eliquis, HTN, status post left BKA came to the ED after falling off the couch due to weakness and change in mental status.  Upon admission patient was noted to be in sepsis secondary to likely diabetic foot ulcer.  Upon admission patient was seen by neurology team, MRI brain was negative.  MRI was positive for right foot fifth digit osteomyelitis.  Podiatry, infectious disease and vascular team were consulted.  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  Concerns of osteomyelitis -Continue IV vancomycin and cefepime. - MRI foot shows digital fist metatarsal osteomyelitis with surrounding muscular edema -Podiatry, VVS and infectious disease has been consulted.  Appreciate input -12/11/20 Patient had PCI and balloon angioplasty of right lower extremity, stent placement of left iliac.  -Vascular surgery input greatly appreciated, status post right iliac artery stent 02/20/2022, he will need right leg bypass, with tentative planning this coming Monday. -Continue with broad-spectrum antibiotics till amputation is performed.    Diabetic foot ulcer associated with type 2 diabetes mellitus ([place]) Type 2 diabetes mellitus with diabetic peripheral angiopathy without gangrene ([place]) - Hemoglobin A1c 11.3 on outpatient Toujeo and Trulicity?  Versus Levemir? - Sliding scale and Accu-Cheks - We will change his Semglee to 20 units twice daily and uptitrate as needed..  Acute metabolic encephalopathy This is resolved.  MRI brain showed remote infarct.  Seen by neurology team.  TSH and ammonia is normal  Atrial fibrillation, chronic (HCC) Eliquis on hold for anticipated procedures, -On Lovenox full treatment dose, will hold after morning dose in anticipation for surgery tomorrow, full anticoagulation to be resumed after bypass tomorrow once cleared by vascular surgery, will keep on Lovenox as he will still need further surgery by podiatry. .   Status post below-knee amputation (HCC) - left side Chronic. Stable.  Essential hypertension Continue cozaar  DVT prophylaxis: Eliquis was held, will start on Lovenox treatment dose for A-fib. Code Status: Full code Family Communication:    Status is: Inpatient Remains inpatient appropriate because: Maintain hospital stay for IV antibiotics, vascular intervention tomorrow with likely amputation by podiatry thereafter.  Subjective:  No Significant events overnight as discussed with staff, he denies any complaints,  Examination:  Awake Alert, Oriented X 3, No new F.N deficits, Normal affect Symmetrical Chest wall movement, Good air movement bilaterally, CTAB RRR,No Gallops,Rubs or new Murmurs, No Parasternal Heave +ve B.Sounds, Abd Soft, No tenderness, No rebound - guarding or rigidity. Right foot diabetic ulcer with surrounding erythema, left lower extremity BKA   Objective: Vitals:   02/23/22 2337 02/24/22 0433 02/24/22 0811 02/24/22 1128  BP: 125/81 118/68 (!) 143/69 102/72  Pulse: 71 63 69 72  Resp: 15  15 15 15   Temp: 97.8 F (36.6 C) 97.8 F (36.6 C) 98.2 F  (36.8 C) 97.6 F (36.4 C)  TempSrc: Oral Oral Oral Oral  SpO2: 99% 95% 98% 96%  Weight:  100.4 kg    Height:        Intake/Output Summary (Last 24 hours) at 02/24/2022 1250 Last data filed at 02/24/2022 1129 Gross per 24 hour  Intake 2822.71 ml  Output 3400 ml  Net -577.29 ml   Filed Weights   02/22/22 0645 02/23/22 0310 02/24/22 0433  Weight: 101 kg 101.4 kg 100.4 kg     Data Reviewed:   CBC: Recent Labs  Lab 02/19/22 0135 02/20/22 0222 02/21/22 0120 02/22/22 0108 02/23/22 0116  WBC 7.6 8.7 10.4 9.9 10.6*  HGB 13.0 13.3 13.5 13.0 13.4  HCT 38.2* 40.7 40.8 38.8* 38.8*  MCV 83.2 84.8 83.8 82.6 81.9  PLT 181 204 226 233 0000000   Basic Metabolic Panel: Recent Labs  Lab 02/19/22 0135 02/20/22 0222 02/21/22 0120 02/22/22 0108 02/23/22 0116  NA 136 136 137 138 141  K 4.0 4.1 4.2 4.1 3.9  CL 109 106 107 107 110  CO2 19* 23 22 24 24   GLUCOSE 260* 324* 345* 307* 153*  BUN 13 11 11 12 9   CREATININE 0.83 0.86 0.74 0.85 0.75  CALCIUM 8.8* 8.8* 8.7* 8.9 9.1  MG 1.7 1.7 1.8 1.8 1.7   GFR: Estimated Creatinine Clearance: 111.5 mL/min (by C-G formula based on SCr of 0.75 mg/dL). Liver Function Tests: No results for input(s): AST, ALT, ALKPHOS, BILITOT, PROT, ALBUMIN in the last 168 hours.  No results for input(s): LIPASE, AMYLASE in the last 168 hours. No results for input(s): AMMONIA in the last 168 hours.  Coagulation Profile: No results for input(s): INR, PROTIME in the last 168 hours.  Cardiac Enzymes: No results for input(s): CKTOTAL, CKMB, CKMBINDEX, TROPONINI in the last 168 hours. BNP (last 3 results) No results for input(s): PROBNP in the last 8760 hours. HbA1C: No results for input(s): HGBA1C in the last 72 hours.  CBG: Recent Labs  Lab 02/23/22 1841 02/23/22 2135 02/24/22 0642 02/24/22 0934 02/24/22 1128  GLUCAP 219* 195* 216* 225* 217*   Lipid Profile: No results for input(s): CHOL, HDL, LDLCALC, TRIG, CHOLHDL, LDLDIRECT in the last 72  hours.  Thyroid Function Tests: No results for input(s): TSH, T4TOTAL, FREET4, T3FREE, THYROIDAB in the last 72 hours.  Anemia Panel: No results for input(s): VITAMINB12, FOLATE, FERRITIN, TIBC, IRON, RETICCTPCT in the last 72 hours.  Sepsis Labs: No results for input(s): PROCALCITON, LATICACIDVEN in the last 168 hours.   Recent Results (from the past 240 hour(s))  Culture, blood (single)     Status: None   Collection Time: 02/15/22  9:27 PM   Specimen: BLOOD  Result Value Ref Range Status   Specimen Description BLOOD LEFT ANTECUBITAL  Final   Special Requests   Final    BOTTLES DRAWN AEROBIC AND ANAEROBIC Blood Culture results may not be optimal due to an excessive volume of blood received in culture bottles   Culture   Final    NO GROWTH 5 DAYS Performed at [facility] [address], [place], [place] [address]    Report Status 02/20/2022 FINAL  Final  MRSA Next Gen by PCR, Nasal     Status: None   Collection Time: 02/19/22  8:50 AM   Specimen: Nasal Mucosa; Nasal Swab  Result Value Ref Range Status   MRSA by PCR Next Gen  NOT DETECTED NOT DETECTED Final    Comment: (NOTE) The GeneXpert MRSA Assay (FDA approved for NASAL specimens only), is one component of a comprehensive MRSA colonization surveillance program. It is not intended to diagnose MRSA infection nor to guide or monitor treatment for MRSA infections. Test performance is not FDA approved in patients less than 33 years old. Performed at [facility], [place] [address] [place], [place] [address]   Surgical PCR screen     Status: None   Collection Time: 02/20/22  4:12 AM   Specimen: Nasal Mucosa; Nasal Swab  Result Value Ref Range Status   MRSA, PCR NEGATIVE NEGATIVE Final   Staphylococcus aureus NEGATIVE NEGATIVE Final    Comment: (NOTE) The Xpert SA Assay (FDA approved for NASAL specimens in patients 35 years of age and older), is one component of a comprehensive surveillance program. It is not  intended to diagnose infection nor to guide or monitor treatment. Performed at [facility], [place] [address], [place], [place] [address]          Radiology Studies: No results found.      Scheduled Meds:  aspirin EC  81 mg Oral Daily   atorvastatin  80 mg Oral q1800   insulin aspart  0-15 Units Subcutaneous TID WC   insulin aspart  0-5 Units Subcutaneous QHS   insulin aspart  5 Units Subcutaneous TID WC   insulin glargine-yfgn  20 Units Subcutaneous BID   losartan  25 mg Oral Daily   metroNIDAZOLE  500 mg Oral Q12H   mupirocin ointment  1 application. Nasal BID   pantoprazole  40 mg Oral Daily   polyethylene glycol  17 g Oral Daily   sodium chloride flush  3 mL Intravenous Once   sodium chloride flush  3 mL Intravenous Q12H   Continuous Infusions:  sodium chloride 50 mL/hr at 02/23/22 1830   sodium chloride     cefTRIAXone (ROCEPHIN)  IV 2 g (02/24/22 1005)   vancomycin 1,250 mg (02/24/22 0534)     LOS: 8 days       Phillips Climes, MD [facility]  If 7PM-7AM, please contact night-coverage  02/24/2022, 12:50 PM

## 2022-02-25 ENCOUNTER — Inpatient Hospital Stay (HOSPITAL_COMMUNITY): Payer: Medicare Other | Admitting: Anesthesiology

## 2022-02-25 ENCOUNTER — Encounter (HOSPITAL_COMMUNITY)
Admission: EM | Disposition: A | Discharge status: Rehabilitation Facility | Payer: Self-pay | Source: Home / Self Care | Attending: Internal Medicine

## 2022-02-25 ENCOUNTER — Encounter (HOSPITAL_COMMUNITY): Payer: Self-pay | Admitting: Internal Medicine

## 2022-02-25 ENCOUNTER — Other Ambulatory Visit: Payer: Self-pay

## 2022-02-25 DIAGNOSIS — L97529 Non-pressure chronic ulcer of other part of left foot with unspecified severity: Secondary | ICD-10-CM | POA: Diagnosis not present

## 2022-02-25 DIAGNOSIS — I70221 Atherosclerosis of native arteries of extremities with rest pain, right leg: Secondary | ICD-10-CM

## 2022-02-25 DIAGNOSIS — I70244 Atherosclerosis of native arteries of left leg with ulceration of heel and midfoot: Secondary | ICD-10-CM | POA: Diagnosis not present

## 2022-02-25 DIAGNOSIS — E11621 Type 2 diabetes mellitus with foot ulcer: Secondary | ICD-10-CM | POA: Diagnosis not present

## 2022-02-25 DIAGNOSIS — M869 Osteomyelitis, unspecified: Secondary | ICD-10-CM | POA: Diagnosis not present

## 2022-02-25 DIAGNOSIS — Z794 Long term (current) use of insulin: Secondary | ICD-10-CM | POA: Diagnosis not present

## 2022-02-25 DIAGNOSIS — L97513 Non-pressure chronic ulcer of other part of right foot with necrosis of muscle: Secondary | ICD-10-CM | POA: Diagnosis not present

## 2022-02-25 HISTORY — PX: FEMORAL-TIBIAL BYPASS GRAFT: SHX938

## 2022-02-25 HISTORY — PX: VEIN HARVEST: SHX6363

## 2022-02-25 LAB — GLUCOSE, CAPILLARY
Glucose-Capillary: 181 mg/dL — ABNORMAL HIGH (ref 70–99)
Glucose-Capillary: 174 mg/dL — ABNORMAL HIGH (ref 70–99)
Glucose-Capillary: 141 mg/dL — ABNORMAL HIGH (ref 70–99)
Glucose-Capillary: 153 mg/dL — ABNORMAL HIGH (ref 70–99)
Glucose-Capillary: 175 mg/dL — ABNORMAL HIGH (ref 70–99)
Glucose-Capillary: 171 mg/dL — ABNORMAL HIGH (ref 70–99)
Glucose-Capillary: 190 mg/dL — ABNORMAL HIGH (ref 70–99)

## 2022-02-25 LAB — TYPE AND SCREEN
ABO/RH(D): O POS
Antibody Screen: NEGATIVE

## 2022-02-25 SURGERY — CREATION, BYPASS, ARTERIAL, FEMORAL TO TIBIAL, USING GRAFT
Anesthesia: General | Site: Leg Upper | Laterality: Right

## 2022-02-25 MED ORDER — 0.9 % SODIUM CHLORIDE (POUR BTL) OPTIME
TOPICAL | Status: DC | PRN
  Administered 2022-02-25: 1000 mL

## 2022-02-25 MED ORDER — PHENOL 1.4 % MT LIQD: 1.0000 | OROMUCOSAL | Status: DC | PRN

## 2022-02-25 MED ORDER — SODIUM CHLORIDE 0.9 % IV SOLN: 500.0000 mL | Freq: Once | INTRAVENOUS | Status: DC | PRN

## 2022-02-25 MED ORDER — CHLORHEXIDINE GLUCONATE CLOTH 2 % EX PADS
6.0000 | MEDICATED_PAD | Freq: Every day | CUTANEOUS | Status: DC
  Administered 2022-02-25 – 2022-02-28 (×3): 6 via TOPICAL

## 2022-02-25 MED ORDER — METOPROLOL TARTRATE 5 MG/5ML IV SOLN: 2.0000 mg | INTRAVENOUS | Status: DC | PRN

## 2022-02-25 MED ORDER — HEPARIN SODIUM (PORCINE) 1000 UNIT/ML IJ SOLN
INTRAMUSCULAR | Status: DC | PRN
Start: 2022-02-25 — End: 2022-02-25
  Administered 2022-02-25: 10000 [IU] via INTRAVENOUS

## 2022-02-25 MED ORDER — SUCCINYLCHOLINE CHLORIDE 200 MG/10ML IV SOSY
PREFILLED_SYRINGE | INTRAVENOUS | Status: AC
  Filled 2022-02-25: qty 10

## 2022-02-25 MED ORDER — ONDANSETRON HCL 4 MG/2ML IJ SOLN
INTRAMUSCULAR | Status: DC | PRN
  Administered 2022-02-25: 4 mg via INTRAVENOUS

## 2022-02-25 MED ORDER — LIDOCAINE 2% (20 MG/ML) 5 ML SYRINGE
INTRAMUSCULAR | Status: DC | PRN
  Administered 2022-02-25: 80 mg via INTRAVENOUS

## 2022-02-25 MED ORDER — HEPARIN SODIUM (PORCINE) 1000 UNIT/ML IJ SOLN
INTRAMUSCULAR | Status: AC
  Filled 2022-02-25: qty 20

## 2022-02-25 MED ORDER — HEPARIN SODIUM (PORCINE) 5000 UNIT/ML IJ SOLN: 5000.0000 [IU] | Freq: Three times a day (TID) | INTRAMUSCULAR | Status: DC

## 2022-02-25 MED ORDER — ALUM & MAG HYDROXIDE-SIMETH 200-200-20 MG/5ML PO SUSP: 15.0000 mL | ORAL | Status: DC | PRN

## 2022-02-25 MED ORDER — INSULIN ASPART 100 UNIT/ML IJ SOLN
0.0000 [IU] | INTRAMUSCULAR | Status: AC | PRN
  Administered 2022-02-25 (×3): 2 [IU] via SUBCUTANEOUS
  Filled 2022-02-25: qty 1

## 2022-02-25 MED ORDER — LABETALOL HCL 5 MG/ML IV SOLN
10.0000 mg | INTRAVENOUS | Status: DC | PRN
  Administered 2022-02-25 – 2022-02-26 (×2): 10 mg via INTRAVENOUS
  Filled 2022-02-25 (×2): qty 4

## 2022-02-25 MED ORDER — PHENYLEPHRINE 80 MCG/ML (10ML) SYRINGE FOR IV PUSH (FOR BLOOD PRESSURE SUPPORT)
PREFILLED_SYRINGE | INTRAVENOUS | Status: DC | PRN
  Administered 2022-02-25 (×3): 80 ug via INTRAVENOUS
  Administered 2022-02-25: 160 ug via INTRAVENOUS

## 2022-02-25 MED ORDER — FENTANYL CITRATE (PF) 250 MCG/5ML IJ SOLN
INTRAMUSCULAR | Status: AC
  Filled 2022-02-25: qty 5

## 2022-02-25 MED ORDER — CLOPIDOGREL BISULFATE 75 MG PO TABS
75.0000 mg | ORAL_TABLET | Freq: Every day | ORAL | Status: DC
  Administered 2022-02-26 – 2022-03-08 (×10): 75 mg via ORAL
  Filled 2022-02-25 (×10): qty 1

## 2022-02-25 MED ORDER — LACTATED RINGERS IV SOLN: INTRAVENOUS | Status: DC | PRN

## 2022-02-25 MED ORDER — PROTAMINE SULFATE 10 MG/ML IV SOLN
INTRAVENOUS | Status: DC | PRN
  Administered 2022-02-25: 50 mg via INTRAVENOUS

## 2022-02-25 MED ORDER — CHLORHEXIDINE GLUCONATE 0.12 % MT SOLN: 15.0000 mL | Freq: Once | OROMUCOSAL | Status: AC

## 2022-02-25 MED ORDER — PROTAMINE SULFATE 10 MG/ML IV SOLN
INTRAVENOUS | Status: AC
  Filled 2022-02-25: qty 10

## 2022-02-25 MED ORDER — SUGAMMADEX SODIUM 200 MG/2ML IV SOLN
INTRAVENOUS | Status: DC | PRN
Start: 2022-02-25 — End: 2022-02-25
  Administered 2022-02-25: 250 mg via INTRAVENOUS

## 2022-02-25 MED ORDER — ACETAMINOPHEN 650 MG RE SUPP: 325.0000 mg | RECTAL | Status: DC | PRN

## 2022-02-25 MED ORDER — MEPERIDINE HCL 25 MG/ML IJ SOLN
INTRAMUSCULAR | Status: AC
  Administered 2022-02-25: 6.25 mg via INTRAVENOUS
  Filled 2022-02-25: qty 1

## 2022-02-25 MED ORDER — ACETAMINOPHEN 10 MG/ML IV SOLN
INTRAVENOUS | Status: AC
  Administered 2022-02-25: 1000 mg via INTRAVENOUS
  Filled 2022-02-25: qty 100

## 2022-02-25 MED ORDER — FENTANYL CITRATE (PF) 100 MCG/2ML IJ SOLN
INTRAMUSCULAR | Status: AC
  Administered 2022-02-25: 25 ug via INTRAVENOUS
  Filled 2022-02-25: qty 2

## 2022-02-25 MED ORDER — LACTATED RINGERS IV SOLN: INTRAVENOUS | Status: DC

## 2022-02-25 MED ORDER — CHLORHEXIDINE GLUCONATE 0.12 % MT SOLN
OROMUCOSAL | Status: AC
  Administered 2022-02-25: 15 mL via OROMUCOSAL
  Filled 2022-02-25: qty 15

## 2022-02-25 MED ORDER — FENTANYL CITRATE (PF) 250 MCG/5ML IJ SOLN
INTRAMUSCULAR | Status: DC | PRN
  Administered 2022-02-25 (×3): 25 ug via INTRAVENOUS
  Administered 2022-02-25: 100 ug via INTRAVENOUS
  Administered 2022-02-25: 25 ug via INTRAVENOUS
  Administered 2022-02-25: 50 ug via INTRAVENOUS

## 2022-02-25 MED ORDER — LIDOCAINE 2% (20 MG/ML) 5 ML SYRINGE
INTRAMUSCULAR | Status: AC
  Filled 2022-02-25: qty 10

## 2022-02-25 MED ORDER — MEPERIDINE HCL 25 MG/ML IJ SOLN: 6.2500 mg | INTRAMUSCULAR | Status: DC | PRN

## 2022-02-25 MED ORDER — FENTANYL CITRATE (PF) 100 MCG/2ML IJ SOLN: 25.0000 ug | INTRAMUSCULAR | Status: DC | PRN

## 2022-02-25 MED ORDER — PROTAMINE SULFATE 10 MG/ML IV SOLN
INTRAVENOUS | Status: AC
  Filled 2022-02-25: qty 25

## 2022-02-25 MED ORDER — GLYCOPYRROLATE PF 0.2 MG/ML IJ SOSY
PREFILLED_SYRINGE | INTRAMUSCULAR | Status: AC
  Filled 2022-02-25: qty 1

## 2022-02-25 MED ORDER — PROPOFOL 10 MG/ML IV BOLUS
INTRAVENOUS | Status: DC | PRN
  Administered 2022-02-25: 30 mg via INTRAVENOUS
  Administered 2022-02-25: 130 mg via INTRAVENOUS

## 2022-02-25 MED ORDER — SODIUM CHLORIDE 0.9 % IV SOLN: INTRAVENOUS | Status: DC

## 2022-02-25 MED ORDER — ACETAMINOPHEN 325 MG PO TABS
325.0000 mg | ORAL_TABLET | ORAL | Status: DC | PRN
  Administered 2022-02-26: 650 mg via ORAL
  Filled 2022-02-25 (×2): qty 2

## 2022-02-25 MED ORDER — SUGAMMADEX SODIUM 500 MG/5ML IV SOLN
INTRAVENOUS | Status: AC
  Filled 2022-02-25: qty 5

## 2022-02-25 MED ORDER — HEPARIN 6000 UNIT IRRIGATION SOLUTION
Status: DC | PRN
  Administered 2022-02-25: 1

## 2022-02-25 MED ORDER — MORPHINE SULFATE (PF) 2 MG/ML IV SOLN
2.0000 mg | INTRAVENOUS | Status: DC | PRN
  Administered 2022-02-26: 2 mg via INTRAVENOUS
  Filled 2022-02-25: qty 1

## 2022-02-25 MED ORDER — EPHEDRINE 5 MG/ML INJ
INTRAVENOUS | Status: AC
  Filled 2022-02-25: qty 10

## 2022-02-25 MED ORDER — ROCURONIUM BROMIDE 10 MG/ML (PF) SYRINGE
PREFILLED_SYRINGE | INTRAVENOUS | Status: DC | PRN
  Administered 2022-02-25: 20 mg via INTRAVENOUS
  Administered 2022-02-25: 60 mg via INTRAVENOUS
  Administered 2022-02-25 (×2): 20 mg via INTRAVENOUS
  Administered 2022-02-25: 30 mg via INTRAVENOUS
  Administered 2022-02-25: 20 mg via INTRAVENOUS

## 2022-02-25 MED ORDER — ROCURONIUM BROMIDE 10 MG/ML (PF) SYRINGE
PREFILLED_SYRINGE | INTRAVENOUS | Status: AC
  Filled 2022-02-25: qty 30

## 2022-02-25 MED ORDER — ORAL CARE MOUTH RINSE: 15.0000 mL | Freq: Once | OROMUCOSAL | Status: AC

## 2022-02-25 MED ORDER — ACETAMINOPHEN 10 MG/ML IV SOLN: 1000.0000 mg | Freq: Once | INTRAVENOUS | Status: DC | PRN

## 2022-02-25 MED ORDER — ONDANSETRON HCL 4 MG/2ML IJ SOLN
INTRAMUSCULAR | Status: AC
  Filled 2022-02-25: qty 4

## 2022-02-25 SURGICAL SUPPLY — 64 items
ADH SKN CLS APL DERMABOND .7 (GAUZE/BANDAGES/DRESSINGS) ×4
AGENT HMST SPONGE THK3/8 (HEMOSTASIS)
BAG COUNTER SPONGE SURGICOUNT (BAG) ×3 IMPLANT
BAG SPNG CNTER NS LX DISP (BAG) ×2
BANDAGE ESMARK 6X9 LF (GAUZE/BANDAGES/DRESSINGS) IMPLANT
BLADE CLIPPER SURG (BLADE) ×3 IMPLANT
BNDG CMPR 9X6 STRL LF SNTH (GAUZE/BANDAGES/DRESSINGS)
BNDG ESMARK 6X9 LF (GAUZE/BANDAGES/DRESSINGS)
CANISTER SUCT 3000ML PPV (MISCELLANEOUS) ×3 IMPLANT
CANNULA VESSEL 3MM 2 BLNT TIP (CANNULA) ×1 IMPLANT
CATH EMB 4FR 40CM (CATHETERS) ×1 IMPLANT
CLIP VESOCCLUDE MED 24/CT (CLIP) ×3 IMPLANT
CLIP VESOCCLUDE SM WIDE 24/CT (CLIP) ×3 IMPLANT
CNTNR URN SCR LID CUP LEK RST (MISCELLANEOUS) IMPLANT
CONT SPEC 4OZ STRL OR WHT (MISCELLANEOUS) ×3
COVER PROBE W GEL 5X96 (DRAPES) ×3 IMPLANT
CUFF TOURN SGL QUICK 24 (TOURNIQUET CUFF)
CUFF TOURN SGL QUICK 34 (TOURNIQUET CUFF)
CUFF TRNQT CYL 24X4X16.5-23 (TOURNIQUET CUFF) IMPLANT
CUFF TRNQT CYL 34X4.125X (TOURNIQUET CUFF) IMPLANT
DERMABOND ADVANCED (GAUZE/BANDAGES/DRESSINGS) ×2
DERMABOND ADVANCED .7 DNX12 (GAUZE/BANDAGES/DRESSINGS) ×2 IMPLANT
DRAIN CHANNEL 15F RND FF W/TCR (WOUND CARE) IMPLANT
DRAPE C-ARM 42X72 X-RAY (DRAPES) ×3 IMPLANT
DRAPE HALF SHEET 40X57 (DRAPES) IMPLANT
ELECT REM PT RETURN 9FT ADLT (ELECTROSURGICAL) ×3
ELECTRODE REM PT RTRN 9FT ADLT (ELECTROSURGICAL) ×2 IMPLANT
EVACUATOR SILICONE 100CC (DRAIN) IMPLANT
GLOVE BIO SURGEON STRL SZ7.5 (GLOVE) ×3 IMPLANT
GLOVE BIOGEL PI IND STRL 8 (GLOVE) ×2 IMPLANT
GLOVE BIOGEL PI INDICATOR 8 (GLOVE) ×1
GOWN STRL REUS W/ TWL LRG LVL3 (GOWN DISPOSABLE) ×4 IMPLANT
GOWN STRL REUS W/ TWL XL LVL3 (GOWN DISPOSABLE) ×4 IMPLANT
GOWN STRL REUS W/TWL LRG LVL3 (GOWN DISPOSABLE) ×6
GOWN STRL REUS W/TWL XL LVL3 (GOWN DISPOSABLE) ×6
HEMOSTAT SPONGE AVITENE ULTRA (HEMOSTASIS) IMPLANT
KIT BASIN OR (CUSTOM PROCEDURE TRAY) ×3 IMPLANT
KIT TURNOVER KIT B (KITS) ×3 IMPLANT
LOOP VESSEL MINI RED (MISCELLANEOUS) ×3 IMPLANT
MARKER SKIN DUAL TIP RULER LAB (MISCELLANEOUS) ×1 IMPLANT
NS IRRIG 1000ML POUR BTL (IV SOLUTION) ×6 IMPLANT
PACK PERIPHERAL VASCULAR (CUSTOM PROCEDURE TRAY) ×3 IMPLANT
PAD ARMBOARD 7.5X6 YLW CONV (MISCELLANEOUS) ×6 IMPLANT
PENCIL BUTTON HOLSTER BLD 10FT (ELECTRODE) ×2 IMPLANT
PUNCH AORTIC ROTATE 4.0MM (MISCELLANEOUS) ×1 IMPLANT
STOPCOCK 4 WAY LG BORE MALE ST (IV SETS) ×4 IMPLANT
SUT MNCRL AB 4-0 PS2 18 (SUTURE) ×9 IMPLANT
SUT PROLENE 5 0 C 1 24 (SUTURE) ×7 IMPLANT
SUT PROLENE 6 0 BV (SUTURE) ×10 IMPLANT
SUT PROLENE 7 0 BV 1 (SUTURE) IMPLANT
SUT SILK 2 0 PERMA HAND 18 BK (SUTURE) IMPLANT
SUT SILK 2 0 SH (SUTURE) ×3 IMPLANT
SUT SILK 3 0 (SUTURE) ×12
SUT SILK 3-0 18XBRD TIE 12 (SUTURE) IMPLANT
SUT VIC AB 2-0 CT1 27 (SUTURE) ×6
SUT VIC AB 2-0 CT1 TAPERPNT 27 (SUTURE) ×4 IMPLANT
SUT VIC AB 3-0 SH 27 (SUTURE) ×18
SUT VIC AB 3-0 SH 27X BRD (SUTURE) ×4 IMPLANT
SYR 3ML LL SCALE MARK (SYRINGE) ×1 IMPLANT
TOWEL GREEN STERILE (TOWEL DISPOSABLE) ×3 IMPLANT
TRAY FOLEY MTR SLVR 16FR STAT (SET/KITS/TRAYS/PACK) ×3 IMPLANT
TUBING EXTENTION W/L.L. (IV SETS) ×3 IMPLANT
UNDERPAD 30X36 HEAVY ABSORB (UNDERPADS AND DIAPERS) ×3 IMPLANT
WATER STERILE IRR 1000ML POUR (IV SOLUTION) ×3 IMPLANT

## 2022-02-25 NOTE — Anesthesia Preprocedure Evaluation (Addendum)
Anesthesia Evaluation  Patient identified by MRN, date of birth, ID band Patient awake    Reviewed: Allergy & Precautions, NPO status , Patient's Chart, lab work & pertinent test results  Airway Mallampati: II  TM Distance: >3 FB Neck ROM: Full    Dental  (+) Poor Dentition, Missing   Pulmonary neg pulmonary ROS, former smoker,    Pulmonary exam normal        Cardiovascular hypertension, Pt. on medications + CAD, + Peripheral Vascular Disease and + DVT  + dysrhythmias Atrial Fibrillation  Rhythm:Irregular Rate:Normal     Neuro/Psych CVA negative psych ROS   GI/Hepatic Neg liver ROS, GERD  ,  Endo/Other  diabetes, Type 2, Insulin Dependent  Renal/GU negative Renal ROS  negative genitourinary   Musculoskeletal negative musculoskeletal ROS (+)   Abdominal Normal abdominal exam  (+)   Peds  Hematology negative hematology ROS (+)   Anesthesia Other Findings   Reproductive/Obstetrics                            Anesthesia Physical Anesthesia Plan  ASA: 3  Anesthesia Plan: General   Post-op Pain Management:    Induction: Intravenous  PONV Risk Score and Plan: 2 and Ondansetron, Dexamethasone, Midazolam and Treatment may vary due to age or medical condition  Airway Management Planned: Mask and Oral ETT  Additional Equipment: Arterial line  Intra-op Plan:   Post-operative Plan: Extubation in OR  Informed Consent: I have reviewed the patients History and Physical, chart, labs and discussed the procedure including the risks, benefits and alternatives for the proposed anesthesia with the patient or authorized representative who has indicated his/her understanding and acceptance.     Dental advisory given  Plan Discussed with: CRNA  Anesthesia Plan Comments: (Lab Results      Component                Value               Date                      WBC                      10.6 (H)             02/23/2022                HGB                      13.4                02/23/2022                HCT                      38.8 (L)            02/23/2022                MCV                      81.9                02/23/2022                PLT  254                 02/23/2022           Lab Results      Component                Value               Date                      NA                       141                 02/23/2022                K                        3.9                 02/23/2022                CO2                      24                  02/23/2022                GLUCOSE                  153 (H)             02/23/2022                BUN                      9                   02/23/2022                CREATININE               0.75                02/23/2022                CALCIUM                  9.1                 02/23/2022                GFRNONAA                 >60                 02/23/2022          )        Anesthesia Quick Evaluation

## 2022-02-25 NOTE — H&P (View-Only) (Signed)
   PODIATRY PROGRESS NOTE  NAME Austin [name] MRN [id number] DOB 1956-09-12 DOA 02/15/2022   CC: Osteomyelitis right foot.  S/P RLE bypass graft with great saphenous vein DOS: 02/25/2022  66 y.o. male RLE bypass graft with great saphenous vein today due to critical limb ischemia with tissue loss presenting comfortably in bed this evening.  Planning for partial fifth ray amputation RT foot due to arthritis confirmed by MRI.  ASSESSMENT/PLAN OF CARE -Patient seen this evening at bedside.  Resting comfortably.  Plan of care and course of action was explained in detail to the patient.  He demonstrated understanding.  He is okay to proceed with partial amputation of the fifth ray of the right foot. -Planning for surgery Wednesday, 02/27/2022 after 5PM.  Preoperative orders will be placed tomorrow.  N.p.o. after full breakfast morning of 02/27/2022.     Edrick Kins, DPM [facility]  Dr. Edrick Kins, DPM    [address], [place] [address]                Office [phone]  Fax [phone]

## 2022-02-25 NOTE — Anesthesia Postprocedure Evaluation (Signed)
Anesthesia Post Note  Patient: Austin [name]  Procedure(s) Performed: RIGHT LOWER EXTREMITY FEMORAL TO TIBIAL PERONEAL TRUNK BYPASS (Right) VEIN HARVEST OF RIGHT GREATER SAPHENOUS VEIN (Right: Leg Upper)     Patient location during evaluation: PACU Anesthesia Type: General Level of consciousness: awake and alert Pain management: pain level controlled Vital Signs Assessment: post-procedure vital signs reviewed and stable Respiratory status: spontaneous breathing, nonlabored ventilation, respiratory function stable and patient connected to nasal cannula oxygen Cardiovascular status: blood pressure returned to baseline and stable Postop Assessment: no apparent nausea or vomiting Anesthetic complications: no   No notable events documented.  Last Vitals:  Vitals:   02/25/22 1535 02/25/22 1605  BP: 128/74 128/81  Pulse: (!) 107 (!) 112  Resp: (!) 22 16  Temp: 36.6 C 36.7 C  SpO2: 92% 99%    Last Pain:  Vitals:   02/25/22 1605  TempSrc: Oral  PainSc:                  Nelle Don Quinlin Conant

## 2022-02-25 NOTE — Transfer of Care (Signed)
Immediate Anesthesia Transfer of Care Note  Patient: Austin [name]  Procedure(s) Performed: RIGHT LOWER EXTREMITY FEMORAL TO TIBIAL PERONEAL TRUNK BYPASS (Right) VEIN HARVEST OF RIGHT GREATER SAPHENOUS VEIN (Right: Leg Upper)  Patient Location: PACU  Anesthesia Type:General  Level of Consciousness: drowsy and patient cooperative  Airway & Oxygen Therapy: Patient Spontanous Breathing and Patient connected to face mask oxygen  Post-op Assessment: Report given to RN and Post -op Vital signs reviewed and stable  Post vital signs: Reviewed and stable  Last Vitals:  Vitals Value Taken Time  BP 163/135 02/25/22 1443  Temp    Pulse 99 02/25/22 1444  Resp 23 02/25/22 1444  SpO2 78 % 02/25/22 1444  Vitals shown include unvalidated device data.  Last Pain:  Vitals:   02/25/22 0838  TempSrc: Oral  PainSc: 0-No pain      Patients Stated Pain Goal: 1 (02/20/22 2213)  Complications: No notable events documented.

## 2022-02-25 NOTE — Op Note (Signed)
Date Feb 25, 2022  Preoperative diagnosis: Critical limb ischemia of the right lower extremity with tissue loss (diabetic foot ulcer)  Postoperative diagnosis: Same  Procedure: 1.  Harvest of right leg great saphenous vein 2.  Right common femoral artery to tibioperoneal trunk bypass with ipsilateral nonreversed great saphenous vein  Surgeon: Dr. Cephus Shelling, MD  Assistant: Dr. Elna Breslow, MD and Doreatha Massed, [place]  Indications: 66 year old male seen last week with critical limb ischemia with tissue loss with a diabetic foot ulcer of the right leg.  He has undergone extensive stenting in the right lower extremity at [facility] including a previous right common and external iliac artery stent and also SFA stents.  The right common and external iliac stent were patent and I placed an additional external iliac artery stent last week for a 60% stenosis.  He had a flush right SFA occlusion of his SFA stents.  He presents today for right lower extremity tibial bypass after risk benefits discussed.  An assistant was needed for vein harvest as well as to expedite the case and for sewing both anastomosis.  Findings: The common femoral artery was dissected out through a transverse incision in the right groin.  The right great saphenous vein was harvested from the saphenofemoral junction to the distal calf and was of excellent caliber.  I then sewed a nonreversed bypass from the common femoral artery tunneled through the saphenectomy incisions sewn to the TP trunk in nonreversed fashion.  Brisk tibial signals at completion.  Anesthesia: General  Details: Patient was taken to the operating room after informed consent was obtained.  Placed on the operative table in supine position.  General endotracheal anesthesia was induced.  The right groin and right leg were then prepped and draped in standard sterile fashion.  Antibiotics were given.  Timeout performed.  Initially made a transverse incision in the  right groin dissected down through the subcutaneous tissue and ultimately went to open the femoral sheath and there was multiple very large lymph nodes in our way.  I did send one of these lymph nodes to pathology.  I then opened the femoral sheath longitudinally dissected out the SFA and profunda and the common femoral as well as the distal external iliac and controlled all these with Vesseloops.  I then started at the proximal thigh while Dr. Blase Mess started in the distal calf and the saphenous vein was harvested between skip incisions.  All side branches were ligated between 3-0 silk ties and divided.  Once the vein was fully harvested I then ligated over right angle clamp at the saphenofemoral junction and this was oversewn with a 5-0 Prolene.  I then passed through the skin tunnels and then it was ligated over right angle clamp in the calf.  The saphenous vein was then taken to the back table and then was dilated with heparinized saline and multiple side branches were repaired with 6-0 Prolene's.  Ultimately used Meyerding retractors to enter the below-knee popliteal space and then dissected out the below-knee popliteal artery and then dissected onto the TP trunk after the anterior tibial vein was ligated between 2-0 silk ties and divided.  I got a vessel loop around the anterior tibial artery and then dissected all the way down the TP trunk and this was soft for anastomosis.  Patient was given 100 units/kg IV heparin.  The saphenous vein was then brought on the field in nonreversed fashion and spatulated and an end to side anastomosis was sewn to  the right common femoral artery after this was opened with 11 blade scalpel Potts scissors and I used a punch.  I use vessel loops for control.  I then did an end to side anastomosis to the right common femoral artery with 5-0 Prolene parachute technique.  We had good pulse in the proximal bypass but no flow distally given this was nonreversed.  I used a valvulotome  and lysed all the valves and had good pulsatile flow through the bypass.  This was then marked for orientation after 2 medium clips were applied.  I then tunneled it through the saphenectomy incisions and then got to the TP trunk where the vein was cut to the appropriate length after I opened the TP trunk with 11 blade scalpel and Potts scissors.  The proximal and distal TP trunk was controlled with Vesseloops.  I then spatulated the vein and end to side anastomosis was sewn to the TP trunk with 6-0 Prolene parachute technique.  We de-aired the artery prior to completion.  We had good backbleeding and I did pass a #2 dilator distal through the anastomosis with no resistance.  Once we came off clamps we had excellent signals in the right foot in the peroneal and AT.  Patient was given protamine for reversal.  All incisions were irrigated and closed with 2-0 Vicryl, 3-0 Vicryl, 4-0 Monocryl and Dermabond.  Taken to recovery in stable condition.  Complication: None  Condition: Stable  Cephus Shelling, MD [facility] Office: [phone]   Cephus Shelling

## 2022-02-25 NOTE — Progress Notes (Addendum)
   PODIATRY PROGRESS NOTE  NAME Dekari D Mctier MRN [id number] DOB 08/05/1956 DOA 02/15/2022   CC: Osteomyelitis right foot.  S/P RLE bypass graft with great saphenous vein DOS: 02/25/2022  65 y.o. male RLE bypass graft with great saphenous vein today due to critical limb ischemia with tissue loss presenting comfortably in bed this evening.  Planning for partial fifth ray amputation RT foot due to arthritis confirmed by MRI.  ASSESSMENT/PLAN OF CARE -Patient seen this evening at bedside.  Resting comfortably.  Plan of care and course of action was explained in detail to the patient.  He demonstrated understanding.  He is okay to proceed with partial amputation of the fifth ray of the right foot. -Planning for surgery Wednesday, 02/27/2022 after 5PM.  Preoperative orders will be placed tomorrow.  N.p.o. after full breakfast morning of 02/27/2022.     Juliett Eastburn M. Jatin Naumann, DPM [facility]  Dr. Brieanne Mignone M. Symantha Steeber, DPM    [address]                                        [place], [place] [address]                Office [phone]  Fax [phone]     

## 2022-02-25 NOTE — Progress Notes (Signed)
[facility]  Subjective  -no complaints   Objective (!) 134/94 97 97.6 F (36.4 C) (Oral) 18 97%  Intake/Output Summary (Last 24 hours) at 02/25/2022 0915 Last data filed at 02/25/2022 0543 Gross per 24 hour  Intake 1424.38 ml  Output 3150 ml  Net -1725.62 ml    Right femoral pulse palpable    Laboratory Lab Results: Recent Labs    02/23/22 0116  WBC 10.6*  HGB 13.4  HCT 38.8*  PLT 254   BMET Recent Labs    02/23/22 0116  NA 141  K 3.9  CL 110  CO2 24  GLUCOSE 153*  BUN 9  CREATININE 0.75  CALCIUM 9.1    COAG Lab Results  Component Value Date   INR 1.3 (H) 02/15/2022   INR 1.2 03/31/2021   INR 1.10 10/28/2018   No results found for: PTT  Assessment/Planning:  Plan right lower extremity common femoral to tibial bypass for critical ischemia with tissue loss.  Diabetic foot ulcer with occluded SFA stents previously placed at [place].  Risk benefits again discussed.  Cephus Shelling 02/25/2022 9:15 AM --

## 2022-02-25 NOTE — Progress Notes (Signed)
  Day of Surgery Note    Subjective:  cold in recovery room   Vitals:   02/25/22 0740 02/25/22 0838  BP: 129/70 (!) 134/94  Pulse: 95 97  Resp: 20 18  Temp: 98 F (36.7 C) 97.6 F (36.4 C)  SpO2: 96% 97%    Incisions:   all incisions look good Extremities:  + doppler flow right AT/PT/pero Cardiac:  irregular Lungs:  non labored    Assessment/Plan:  This is a 66 y.o. male who is s/p  Right femoral to TP trunk bypass with reversed GSV   -pt with +doppler flow right AT/PT/peroneal  -all incisions look fine -pt was on Lovenox prior to surgery-would hold this until at least morning rounds for risk of bleeding.  Will reassess in the am and most likely will be able to restart at that time.  -to 4E progressive later today   Leontine Locket, PA-C 02/25/2022 3:05 PM [phone]

## 2022-02-25 NOTE — Progress Notes (Signed)
PROGRESS NOTE    Austin [name]  UEA:[id number] DOB: 1956/06/20 DOA: 02/15/2022 PCP: Preston Fleeting, MD   Brief Narrative:   66 year old with history of DM2, HTN, PVD, A-fib on Eliquis, HTN, status post left BKA came to the ED after falling off the couch due to weakness and change in mental status.  Upon admission patient was noted to be in sepsis secondary to likely diabetic foot ulcer.  Upon admission patient was seen by neurology team, MRI brain was negative.  MRI was positive for right foot fifth digit osteomyelitis.  Podiatry, infectious disease and vascular team were consulted. Vascular surgery input greatly appreciated, status post right iliac artery stent 02/20/2022, he will need right leg bypass, with tentative planning for today.   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  Concerns of osteomyelitis -Continue IV vancomycin and cefepime. - MRI foot shows digital fist metatarsal osteomyelitis with surrounding muscular edema -Podiatry, VVS and infectious disease has been consulted.  Appreciate input -12/11/20 Patient had PCI and balloon angioplasty of right lower extremity, stent placement of left iliac.  -Vascular surgery input greatly appreciated, status post right iliac artery stent 02/20/2022, he will need right leg bypass, plan for today. -Continue with broad-spectrum  antibiotics till amputation is performed.   Diabetic foot ulcer associated with type 2 diabetes mellitus (HCC) Type 2 diabetes mellitus with diabetic peripheral angiopathy without gangrene (HCC) - Hemoglobin A1c 11.3 on outpatient Toujeo and Trulicity?  Versus Levemir? - Sliding scale and Accu-Cheks - will increase  his Semglee to 25 units twice daily and uptitrate as needed..  Acute metabolic encephalopathy This is resolved.  MRI brain showed remote infarct.  Seen by neurology team.  TSH and ammonia is normal  Atrial fibrillation, chronic (HCC) Eliquis on hold for anticipated procedures, -On Lovenox full treatment dose, will hold after morning dose in anticipation for surgery tomorrow, full anticoagulation to be resumed after bypass tomorrow once cleared by vascular surgery, will keep on Lovenox as he will still need further surgery by podiatry.  -With vascular surgery when it is safe to resume full dose Lovenox after today's surgery.   Status post below-knee amputation (HCC) - left side Chronic. Stable.  Essential hypertension Continue cozaar  DVT prophylaxis: Eliquis was held, will start on Lovenox treatment dose for A-fib. Code Status: Full code Family Communication:    Status is: Inpatient Remains inpatient appropriate because: Maintain hospital stay for IV antibiotics, vascular intervention tomorrow with likely amputation by podiatry thereafter.  Subjective:  No Significant events overnight as discussed with staff, he denies any complaints,  Examination:  Awake Alert, Oriented X 3, No new F.N deficits, Normal affect Symmetrical Chest wall movement, Good air movement bilaterally, CTAB RRR,No Gallops,Rubs or new Murmurs, No Parasternal Heave +ve B.Sounds, Abd Soft, No tenderness, No rebound - guarding or rigidity. Right foot diabetic ulcer with surrounding erythema, left lower extremity BKA   Objective: Vitals:   02/24/22 2328  02/25/22 0438 02/25/22 0740 02/25/22 0838   BP: 125/75 (!) 159/92 129/70 (!) 134/94  Pulse: 76 91 95 97  Resp: 19 18 20 18   Temp: 98 F (36.7 C) 98.5 F (36.9 C) 98 F (36.7 C) 97.6 F (36.4 C)  TempSrc: Oral Oral Oral Oral  SpO2: 98% 97% 96% 97%  Weight:  100.6 kg    Height:        Intake/Output Summary (Last 24 hours) at 02/25/2022 1101 Last data filed at 02/25/2022 0543 Gross per 24 hour  Intake 1424.38 ml  Output 2550 ml  Net -1125.62 ml   Filed Weights   02/23/22 0310 02/24/22 0433 02/25/22 0438  Weight: 101.4 kg 100.4 kg 100.6 kg     Data Reviewed:   CBC: Recent Labs  Lab 02/19/22 0135 02/20/22 0222 02/21/22 0120 02/22/22 0108 02/23/22 0116  WBC 7.6 8.7 10.4 9.9 10.6*  HGB 13.0 13.3 13.5 13.0 13.4  HCT 38.2* 40.7 40.8 38.8* 38.8*  MCV 83.2 84.8 83.8 82.6 81.9  PLT 181 204 226 233 254   Basic Metabolic Panel: Recent Labs  Lab 02/19/22 0135 02/20/22 0222 02/21/22 0120 02/22/22 0108 02/23/22 0116  NA 136 136 137 138 141  K 4.0 4.1 4.2 4.1 3.9  CL 109 106 107 107 110  CO2 19* 23 22 24 24   GLUCOSE 260* 324* 345* 307* 153*  BUN 13 11 11 12 9   CREATININE 0.83 0.86 0.74 0.85 0.75  CALCIUM 8.8* 8.8* 8.7* 8.9 9.1  MG 1.7 1.7 1.8 1.8 1.7   GFR: Estimated Creatinine Clearance: 111.5 mL/min (by C-G formula based on SCr of 0.75 mg/dL). Liver Function Tests: No results for input(s): AST, ALT, ALKPHOS, BILITOT, PROT, ALBUMIN in the last 168 hours.  No results for input(s): LIPASE, AMYLASE in the last 168 hours. No results for input(s): AMMONIA in the last 168 hours.  Coagulation Profile: No results for input(s): INR, PROTIME in the last 168 hours.  Cardiac Enzymes: No results for input(s): CKTOTAL, CKMB, CKMBINDEX, TROPONINI in the last 168 hours. BNP (last 3 results) No results for input(s): PROBNP in the last 8760 hours. HbA1C: No results for input(s): HGBA1C in the last 72 hours.  CBG: Recent Labs  Lab 02/24/22 1656 02/24/22 2134 02/25/22 0615 02/25/22 0841 02/25/22 1058  GLUCAP  236* 289* 181* 174* 141*   Lipid Profile: No results for input(s): CHOL, HDL, LDLCALC, TRIG, CHOLHDL, LDLDIRECT in the last 72 hours.  Thyroid Function Tests: No results for input(s): TSH, T4TOTAL, FREET4, T3FREE, THYROIDAB in the last 72 hours.  Anemia Panel: No results for input(s): VITAMINB12, FOLATE, FERRITIN, TIBC, IRON, RETICCTPCT in the last 72 hours.  Sepsis Labs: No results for input(s): PROCALCITON, LATICACIDVEN in the last 168 hours.   Recent Results (from the past 240 hour(s))  Culture, blood (single)     Status: None   Collection Time: 02/15/22  9:27 PM   Specimen: BLOOD  Result Value Ref Range Status   Specimen Description BLOOD LEFT ANTECUBITAL  Final   Special Requests   Final    BOTTLES DRAWN AEROBIC AND ANAEROBIC Blood Culture results may not be optimal due to an excessive volume of blood received in culture bottles   Culture   Final    NO GROWTH 5 DAYS Performed at [facility], [address] [place], [facility] [address]    Report Status 02/20/2022 FINAL  Final  MRSA Next Gen by PCR, Nasal     Status: None   Collection Time: 02/19/22  8:50 AM   Specimen: Nasal Mucosa; Nasal Swab  Result Value Ref Range Status   MRSA by PCR Next Gen NOT DETECTED NOT DETECTED Final    Comment: (NOTE) The GeneXpert MRSA Assay (FDA approved for NASAL specimens only), is one component of a comprehensive MRSA colonization surveillance program. It is not intended to diagnose MRSA infection nor to guide or monitor treatment for MRSA infections. Test performance is not FDA approved in patients less than 123 years old. Performed at [facility], [address] [place], [place] [address]   Surgical PCR screen     Status: None   Collection Time: 02/20/22  4:12 AM   Specimen: Nasal Mucosa; Nasal Swab  Result Value Ref Range Status   MRSA, PCR NEGATIVE NEGATIVE Final   Staphylococcus aureus NEGATIVE NEGATIVE Final    Comment: (NOTE) The Xpert SA Assay (FDA approved  for NASAL specimens in patients 222 years of age and older), is one component of a comprehensive surveillance program. It is not intended to diagnose infection nor to guide or monitor treatment. Performed at [facility], [address] [place], [place] [address]          Radiology Studies: No results found.      Scheduled Meds:  [MAR Hold] aspirin EC  81 mg Oral Daily   [MAR Hold] atorvastatin  80 mg Oral q1800   [MAR Hold] insulin aspart  0-15 Units Subcutaneous TID WC   [MAR Hold] insulin aspart  0-5 Units Subcutaneous QHS   [MAR Hold] insulin aspart  5 Units Subcutaneous TID WC   [MAR Hold] insulin glargine-yfgn  20 Units Subcutaneous BID   [MAR Hold] losartan  25 mg Oral Daily   [MAR Hold] metroNIDAZOLE  500 mg Oral Q12H   [MAR Hold] pantoprazole  40 mg Oral Daily   [MAR Hold] polyethylene glycol  17 g Oral Daily   [MAR Hold] sodium chloride flush  3 mL Intravenous Once   [MAR Hold] sodium chloride flush  3 mL Intravenous Q12H   Continuous Infusions:  sodium chloride Stopped (02/24/22 1237)   [MAR Hold] sodium chloride     [MAR Hold] cefTRIAXone (ROCEPHIN)  IV 0 g (02/24/22 1035)   lactated ringers 10 mL/hr at 02/25/22 1011   [MAR Hold] vancomycin 1,250 mg (02/25/22 0543)     LOS: 9 days       Huey Bienenstockawood Aliviah Spain, MD [facility]  If 7PM-7AM, please contact night-coverage  02/25/2022, 11:01 AM

## 2022-02-25 NOTE — Progress Notes (Signed)
Inpatient Diabetes Program Recommendations  AACE/ADA: New Consensus Statement on Inpatient Glycemic Control (2015)  Target Ranges:  Prepandial:   less than 140 mg/dL      Peak postprandial:   less than 180 mg/dL (1-2 hours)      Critically ill patients:  140 - 180 mg/dL   Lab Results  Component Value Date   GLUCAP 174 (H) 02/25/2022   HGBA1C 11.3 (H) 02/17/2022    Review of Glycemic Control  Latest Reference Range & Units 02/24/22 11:28 02/24/22 16:56 02/24/22 21:34 02/25/22 06:15 02/25/22 08:41  Glucose-Capillary 70 - 99 mg/dL 665 (H) 993 (H) 570 (H) 181 (H) 174 (H)  (H): Data is abnormally high   Diabetes history: DM 2 Outpatient Diabetes medications: Toujeo 60 units QHS, Humalog 10 units TID, Trulicity 0.75 mg Weekly Current orders for Inpatient glycemic control: Semglee 20 units BID, Novolog 0-15 units TID and 0-5 QHS, Novolog 5 units TID   Inpatient Diabetes Program Recommendations:     Consider increasing Semglee to 25 units BID.  Needs AM Semglee dose today.   Thanks, Lujean Rave, MSN, RNC-OB Diabetes Coordinator [phone] (8a-5p)

## 2022-02-25 NOTE — Anesthesia Procedure Notes (Signed)
Procedure Name: Intubation Date/Time: 02/25/2022 10:28 AM Performed by: Thelma Comp, CRNA Pre-anesthesia Checklist: Patient identified, Emergency Drugs available, Suction available and Patient being monitored Patient Re-evaluated:Patient Re-evaluated prior to induction Oxygen Delivery Method: Circle System Utilized Preoxygenation: Pre-oxygenation with 100% oxygen Induction Type: IV induction Ventilation: Mask ventilation without difficulty and Oral airway inserted - appropriate to patient size Laryngoscope Size: Mac and 4 Grade View: Grade I Tube type: Oral Tube size: 7.5 mm Number of attempts: 1 Airway Equipment and Method: Stylet and Oral airway Placement Confirmation: ETT inserted through vocal cords under direct vision, positive ETCO2 and breath sounds checked- equal and bilateral Secured at: 23 cm Tube secured with: Tape Dental Injury: Teeth and Oropharynx as per pre-operative assessment  Comments: Poor dentition at baseline

## 2022-02-25 NOTE — Care Management Important Message (Signed)
Important Message  Patient Details  Name: Austin [name] MRN: [id number] Date of Birth: 1956/05/22   Medicare Important Message Given:  Yes     Renie Ora 02/25/2022, 8:09 AM

## 2022-02-25 NOTE — Progress Notes (Signed)
PT Cancellation Note  Patient Details Name: Austin [name] MRN: [id number] DOB: 1956-08-09   Cancelled Treatment:    Reason Eval/Treat Not Completed: Patient at procedure or test/unavailable Pt off floor in the OR. Will follow.   Blake Divine A Charlina Dwight 02/25/2022, 8:55 AM [place], PT, DPT Acute Rehabilitation Services Secure chat preferred Office [phone]

## 2022-02-25 NOTE — Discharge Instructions (Signed)
 [facility]  Discharge instructions  Lower Extremity Bypass Surgery  Please refer to the following instruction for your post-procedure care. Your surgeon or physician assistant will discuss any changes with you.  Activity  You are encouraged to walk as much as you can. You can slowly return to normal activities during the month after your surgery. Avoid strenuous activity and heavy lifting until your doctor tells you it's OK. Avoid activities such as vacuuming or swinging a golf club. Do not drive until your doctor give the OK and you are no longer taking prescription pain medications. It is also normal to have difficulty with sleep habits, eating and bowel movement after surgery. These will go away with time.  Bathing/Showering  Shower daily after you go home. Do not soak in a bathtub, hot tub, or swim until the incision heals completely.  Incision Care  Clean your incision with mild soap and water. Shower every day. Pat the area dry with a clean towel. You do not need a bandage unless otherwise instructed. Do not apply any ointments or creams to your incision. If you have open wounds you will be instructed how to care for them or a visiting nurse may be arranged for you. If you have staples or sutures along your incision they will be removed at your post-op appointment. You may have skin glue on your incision. Do not peel it off. It will come off on its own in about one week.  Wash the groin wound with soap and water daily and pat dry. (No tub bath-only shower)  Then put a dry gauze or washcloth in the groin to keep this area dry to help prevent wound infection.  Do this daily and as needed.  Do not use Vaseline or neosporin on your incisions.  Only use soap and water on your incisions and then protect and keep dry.  Diet  Resume your normal diet. There are no special food restrictions following this procedure. A low fat/ low cholesterol diet is  recommended for all patients with vascular disease. In order to heal from your surgery, it is CRITICAL to get adequate nutrition. Your body requires vitamins, minerals, and protein. Vegetables are the best source of vitamins and minerals. Vegetables also provide the perfect balance of protein. Processed food has little nutritional value, so try to avoid this.  Medications  Resume taking all your medications unless your doctor or physician assistant tells you not to. If your incision is causing pain, you may take over-the-counter pain relievers such as acetaminophen (Tylenol). If you were prescribed a stronger pain medication, please aware these medication can cause nausea and constipation. Prevent nausea by taking the medication with a snack or meal. Avoid constipation by drinking plenty of fluids and eating foods with high amount of fiber, such as fruits, vegetables, and grains. Take Colace 100 mg (an over-the-counter stool softener) twice a day as needed for constipation.  Do not take Tylenol if you are taking prescription pain medications.  Follow Up  Our office will schedule a follow up appointment 2-3 weeks following discharge.  Please call us immediately for any of the following conditions  Severe or worsening pain in your legs or feet while at rest or while walking Increase pain, redness, warmth, or drainage (pus) from your incision site(s) Fever of 101 degree or higher The swelling in your leg with the bypass suddenly worsens and becomes more painful than when you were in the hospital If you have   been instructed to feel your graft pulse then you should do so every day. If you can no longer feel this pulse, call the office immediately. Not all patients are given this instruction.  Leg swelling is common after leg bypass surgery.  The swelling should improve over a few months following surgery. To improve the swelling, you may elevate your legs above the level of your heart while you are  sitting or resting. Your surgeon or physician assistant may ask you to apply an ACE wrap or wear compression (TED) stockings to help to reduce swelling.  Reduce your risk of vascular disease  Stop smoking. If you would like help call [facility] at 1-800-QUIT-NOW (1-800-784-8669) or [facility] at 336-586-4000.  Manage your cholesterol Maintain a desired weight Control your diabetes weight Control your diabetes Keep your blood pressure down  If you have any questions, please call the office at [phone]  

## 2022-02-25 NOTE — Anesthesia Procedure Notes (Signed)
Arterial Line Insertion Start/End5/22/2023 9:23 AM Performed by: Janene Harvey, CRNA, CRNA  Patient location: Pre-op. Preanesthetic checklist: patient identified, IV checked, risks and benefits discussed and monitors and equipment checked Lidocaine 1% used for infiltration Left, radial was placed Catheter size: 20 G Hand hygiene performed  and maximum sterile barriers used  Allen's test indicative of satisfactory collateral circulation Attempts: 2 Procedure performed without using ultrasound guided technique. Following insertion, dressing applied and Biopatch. Post procedure assessment: unchanged  Patient tolerated the procedure well with no immediate complications.

## 2022-02-26 ENCOUNTER — Encounter (HOSPITAL_COMMUNITY): Payer: Self-pay | Admitting: Vascular Surgery

## 2022-02-26 DIAGNOSIS — E1169 Type 2 diabetes mellitus with other specified complication: Secondary | ICD-10-CM | POA: Diagnosis not present

## 2022-02-26 DIAGNOSIS — M869 Osteomyelitis, unspecified: Secondary | ICD-10-CM | POA: Diagnosis not present

## 2022-02-26 DIAGNOSIS — L97513 Non-pressure chronic ulcer of other part of right foot with necrosis of muscle: Secondary | ICD-10-CM | POA: Diagnosis not present

## 2022-02-26 DIAGNOSIS — I739 Peripheral vascular disease, unspecified: Secondary | ICD-10-CM

## 2022-02-26 DIAGNOSIS — E11621 Type 2 diabetes mellitus with foot ulcer: Secondary | ICD-10-CM | POA: Diagnosis not present

## 2022-02-26 LAB — CBC
HCT: 36.9 % — ABNORMAL LOW (ref 39.0–52.0)
Hemoglobin: 12.3 g/dL — ABNORMAL LOW (ref 13.0–17.0)
MCH: 27.7 pg (ref 26.0–34.0)
MCHC: 33.3 g/dL (ref 30.0–36.0)
MCV: 83.1 fL (ref 80.0–100.0)
Platelets: 214 10*3/uL (ref 150–400)
RBC: 4.44 MIL/uL (ref 4.22–5.81)
RDW: 15.2 % (ref 11.5–15.5)
WBC: 9.5 10*3/uL (ref 4.0–10.5)
nRBC: 0 % (ref 0.0–0.2)

## 2022-02-26 LAB — GLUCOSE, CAPILLARY
Glucose-Capillary: 206 mg/dL — ABNORMAL HIGH (ref 70–99)
Glucose-Capillary: 215 mg/dL — ABNORMAL HIGH (ref 70–99)
Glucose-Capillary: 206 mg/dL — ABNORMAL HIGH (ref 70–99)
Glucose-Capillary: 195 mg/dL — ABNORMAL HIGH (ref 70–99)

## 2022-02-26 LAB — BASIC METABOLIC PANEL
Anion gap: 6 (ref 5–15)
BUN: 8 mg/dL (ref 8–23)
CO2: 23 mmol/L (ref 22–32)
Calcium: 8.5 mg/dL — ABNORMAL LOW (ref 8.9–10.3)
Chloride: 107 mmol/L (ref 98–111)
Creatinine, Ser: 0.99 mg/dL (ref 0.61–1.24)
GFR, Estimated: >60 mL/min (ref >60)
Glucose, Bld: 232 mg/dL — ABNORMAL HIGH (ref 70–99)
Potassium: 3.9 mmol/L (ref 3.5–5.1)
Sodium: 136 mmol/L (ref 135–145)

## 2022-02-26 LAB — LIPID PANEL
Cholesterol: 52 mg/dL (ref 0–200)
HDL: 15 mg/dL — ABNORMAL LOW (ref >40)
LDL Cholesterol: 24 mg/dL (ref 0–99)
Total CHOL/HDL Ratio: 3.5 RATIO
Triglycerides: 65 mg/dL (ref <150)
VLDL: 13 mg/dL (ref 0–40)

## 2022-02-26 LAB — SURGICAL PATHOLOGY

## 2022-02-26 LAB — POCT ACTIVATED CLOTTING TIME: Activated Clotting Time: 317 seconds

## 2022-02-26 MED ORDER — SALINE SPRAY 0.65 % NA SOLN
1.0000 | NASAL | Status: DC | PRN
  Administered 2022-02-26: 1 via NASAL
  Filled 2022-02-26: qty 44

## 2022-02-26 MED ORDER — ENOXAPARIN SODIUM 100 MG/ML IJ SOSY
100.0000 mg | PREFILLED_SYRINGE | Freq: Two times a day (BID) | INTRAMUSCULAR | Status: AC
  Administered 2022-02-26: 100 mg via SUBCUTANEOUS
  Filled 2022-02-26: qty 1

## 2022-02-26 MED ORDER — INSULIN GLARGINE-YFGN 100 UNIT/ML ~~LOC~~ SOLN
22.0000 [IU] | Freq: Two times a day (BID) | SUBCUTANEOUS | Status: DC
  Administered 2022-02-26 – 2022-02-28 (×6): 22 [IU] via SUBCUTANEOUS
  Filled 2022-02-26 (×8): qty 0.22

## 2022-02-26 NOTE — Progress Notes (Signed)
Mobility Specialist: Progress Note   02/26/22 1744  Mobility  Activity Transferred from chair to bed  Level of Assistance Maximum assist, patient does 25-49%  Assistive Device Sliding board  Activity Response Tolerated well  $Mobility charge 1 Mobility   Pt received in the chair and assisted back to bed per request. Assisted pt using sliding board, mod-max physical assist for scoot. MinA for balance during transfer. Pt required verbal cues for hand placement and direction. Pt is in the bed with call bell and phone in reach. Bed alarm is on.   [facility] Chike Farrington Mobility Specialist Mobility Specialist 5 North: [phone] Mobility Specialist 6 North: [phone]

## 2022-02-26 NOTE — Progress Notes (Signed)
PHARMACIST LIPID MONITORING   Austin [name] is a 66 y.o. male admitted on 02/15/2022 presenting with DFI and limb ischemia.  Pharmacy has been consulted to optimize lipid-lowering therapy with the indication of secondary prevention for clinical ASCVD.  Recent Labs:  Lipid Panel (last 6 months):   Lab Results  Component Value Date   CHOL 52 02/26/2022   TRIG 65 02/26/2022   HDL 15 (L) 02/26/2022   CHOLHDL 3.5 02/26/2022   VLDL 13 02/26/2022   LDLCALC 24 02/26/2022    Hepatic function panel (last 6 months):   Lab Results  Component Value Date   AST 16 02/16/2022   ALT 16 02/16/2022   ALKPHOS 64 02/16/2022   BILITOT 1.2 02/16/2022    SCr (since admission):   Serum creatinine: 0.99 mg/dL 96/78/93 8101 Estimated creatinine clearance: 90.1 mL/min  Current therapy and lipid therapy tolerance Current lipid-lowering therapy: Atorvastatin 80mg  Previous lipid-lowering therapies (if applicable): n/a Documented or reported allergies or intolerances to lipid-lowering therapies (if applicable): n/a  Assessment:   LDL controlled on high intensity statin  Plan:    1.Statin intensity (high intensity recommended for all patients regardless of the LDL):  No statin changes. The patient is already on a high intensity statin.  2.Add ezetimibe (if any one of the following):   Not indicated at this time.  3.Refer to lipid clinic:   No  4.Follow-up with:  Cardiology provider - None  5.Follow-up labs after discharge:  No changes in lipid therapy, repeat a lipid panel in one year.       , PharmD, BCPS Clinical Pharmacist 02/26/2022 7:43 AM

## 2022-02-26 NOTE — Progress Notes (Signed)
Physical Therapy Treatment Patient Details Name: Austin [name] MRN: [id number] DOB: 1956/05/21 Today's Date: 02/26/2022   History of Present Illness Pt is a 66 y.o. 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 Brain MRI negative for acute injury. S/p RLE arteriogram and angioplasty of external iliac artery on 5/17. S/p RLE tibial bypass graft on 5/22. Plan for R 5th ray amputation on 5/25. PMH includes PVD s/p L BKA (10/2018), DM, DVT, HTN, HLD.   PT Comments    Pt slowly progressing with mobility, noted increased fatigue and weakness this session. Today's session focused on transfer training; pt requiring modA for lateral scoot transfer to recliner; pt declines donning LLE BKA or standing trials. Pt remains limited by generalized weakness, decreased activity tolerance, and impaired balance strategies/postural reactions. Pt reports not interested in post-acute rehab, stating family able to provide necessary assist at home. Therefore, continue to recommend HHPT services to maximize functional mobility and independence upon return home.    Recommendations for follow up therapy are one component of a multi-disciplinary discharge planning process, led by the attending physician.  Recommendations may be updated based on patient status, additional functional criteria and insurance authorization.  Follow Up Recommendations  Home health PT     Assistance Recommended at Discharge Intermittent Supervision/Assistance  Patient can return home with the following A lot of help with walking and/or transfers;A little help with bathing/dressing/bathroom;Assistance with cooking/housework;Assist for transportation;Help with stairs or ramp for entrance   Equipment Recommendations  None recommended by PT    Recommendations for Other Services       Precautions / Restrictions Precautions Precautions: Fall;Other (comment) Precaution  Comments: H/o L BKA (prosthetic in room) Restrictions Weight Bearing Restrictions: Yes Other Position/Activity Restrictions: WB for transfers only per podiatry     Mobility  Bed Mobility Overal bed mobility: Needs Assistance Bed Mobility: Supine to Sit     Supine to sit: Mod assist, HOB elevated     General bed mobility comments: modA for RLE management and scooting hips to EOB, min-modA for HHA to elevate trunk and scoot hips forward once seated    Transfers Overall transfer level: Needs assistance Equipment used: None Transfers: Bed to chair/wheelchair/BSC            Lateral/Scoot Transfers: Mod assist General transfer comment: assist for transfer set-up; modA to scoot hips with bed pad for lateral scoot from bed to drop-arm recliner towards R-side; pt demonstrates good awareness of sequencing, even counting "1,2,3" before each scoot. pt declined donning LLE prosthetic in order to trial standing/standing transfer    Ambulation/Gait                   Stairs             Wheelchair Mobility    Modified Rankin (Stroke Patients Only)       Balance Overall balance assessment: Needs assistance Sitting-balance support: No upper extremity supported Sitting balance-Leahy Scale: Fair Sitting balance - Comments: noted decreased sitting balance this session, self-corrected LOB with UE support; requires assist to don R sock       Standing balance comment: pt declined standing trial                            Cognition Arousal/Alertness: Awake/alert Behavior During Therapy: WFL for tasks assessed/performed, Flat affect Overall Cognitive Status: No family/caregiver present to determine baseline cognitive  functioning                                 General Comments: WFL for simple tasks; pt yawning throughout session, suspect fatigue contributing to flat affect and apparent slowed processing        Exercises General Exercises -  Lower Extremity Ankle Circles/Pumps: AROM, Right, Seated Quad Sets: AROM, Right, Seated Other Exercises Other Exercises: incentive spirometer x5 (pt pulling ~[phone] mL, min cues for technique) Other Exercises: noted RLE resting in hip ER/ABD with knee flexion contracture - repositioned and pillows placed to encourage neutral alignment, including increased hip IR/ADD, knee ext    General Comments General comments (skin integrity, edema, etc.): discussed d/c planning as pt requiring increased assist this session, also plans to go to OR tomorrow for R 5th ray amputation which will likely cause transfers to be even more difficult post-op; pt declines donning LLE prosthetic or standing trial this session; pt continues to plan for return home, reports family able to provide current assist level he is requiring      Pertinent Vitals/Pain Pain Assessment Pain Assessment: Faces Faces Pain Scale: Hurts little more Pain Location: RLE incisions Pain Descriptors / Indicators: Grimacing, Guarding, Tightness Pain Intervention(s): Monitored during session, Limited activity within patient's tolerance    Home Living                          Prior Function            PT Goals (current goals can now be found in the care plan section) Progress towards PT goals: Progressing toward goals (slowly)    Frequency    Min 3X/week      PT Plan Current plan remains appropriate    Co-evaluation              AM-PAC PT "6 Clicks" Mobility   Outcome Measure  Help needed turning from your back to your side while in a flat bed without using bedrails?: A Little Help needed moving from lying on your back to sitting on the side of a flat bed without using bedrails?: A Lot Help needed moving to and from a bed to a chair (including a wheelchair)?: A Lot Help needed standing up from a chair using your arms (e.g., wheelchair or bedside chair)?: A Lot Help needed to walk in hospital room?:  Total Help needed climbing 3-5 steps with a railing? : Total 6 Click Score: 11    End of Session   Activity Tolerance: Patient tolerated treatment well;Patient limited by fatigue Patient left: in chair;with call bell/phone within reach;with chair alarm set Nurse Communication: Mobility status PT Visit Diagnosis: Other abnormalities of gait and mobility (R26.89);Pain     Time: [id number] PT Time Calculation (min) (ACUTE ONLY): 25 min  Charges:  $Therapeutic Activity: 23-37 mins                     Mabeline Caras, PT, DPT Acute Rehabilitation Services  Pager [phone] Office [place] 02/26/2022, 2:37 PM

## 2022-02-26 NOTE — Progress Notes (Addendum)
Austin [name]  [id number] DOB: [birth year] DOA: 02/15/2022 PCP: Theotis Burrow, MD   Brief Narrative:   66 year old with history of DM2, HTN, PVD, A-fib on Eliquis, HTN, status post left BKA came to the ED after falling off the couch due to weakness and change in mental status.  Upon admission patient was noted to be in sepsis secondary to likely diabetic foot ulcer.  Upon admission patient was seen by neurology team, MRI brain was negative.  MRI was positive for right foot fifth digit osteomyelitis.  Podiatry, infectious disease and vascular team were consulted. Vascular surgery input greatly appreciated, status post right iliac artery stent 02/20/2022, followed by right common femoral to TP trunk bypass with ipsilateral nonreversed great saphenous vein on 5/22, plan for partial amputation of the fifth ray of the right foot Wednesday by podiatry.   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  Concerns of osteomyelitis -Continue IV vancomycin and cefepime. - MRI foot shows digital fist metatarsal osteomyelitis with surrounding muscular edema -Podiatry, VVS and infectious disease has been consulted.  Appreciate input -12/11/20 Patient had PCI and balloon angioplasty of right lower extremity, stent placement of left iliac.  -Vascular surgery input greatly  appreciated, status post right iliac artery stent 02/20/2022, right common femoral to TP trunk bypass with ipsilateral nonreversed great saphenous vein on 5/22 -Antibiotics management per ID, remains on vancomycin, Rocephin and Flagyl, will repeat blood cultures fever of 101.8 overnight -plan for partial amputation of the fifth ray of the right foot Wednesday by podiatry.  Will need n.p.o. after breakfast for surgery tomorrow after 5 PM.    Diabetic foot ulcer associated with type 2 diabetes mellitus ([place]) Type 2 diabetes mellitus with diabetic peripheral angiopathy without gangrene ([place]) - Sliding scale and Accu-Cheks - Restart Semglee to 22 units twice daily, continue with Premeal NovoLog and insulin sliding scale.  Acute metabolic encephalopathy This is resolved.  MRI brain showed remote infarct.  Seen by neurology team.  TSH and ammonia is normal  Atrial fibrillation, chronic (HCC) Eliquis on hold for anticipated procedures, -On Lovenox full treatment dose, will give 1 dose this morning and hold after in anticipation for surgery tomorrow.  I have discussed with podiatry, anticoagulation can be resumed 6 hours after surgery.   Status post below-knee amputation (HCC) - left side Chronic. Stable.  Essential hypertension Continue cozaar  DVT prophylaxis: Eliquis was held, full dose Lovenox, will hold this evening anticipation for surgery tomorrow. Code Status: Full code Family Communication:    Status is: Inpatient Remains inpatient appropriate because: Maintain hospital stay for IV antibiotics, vascular intervention tomorrow with likely amputation by podiatry thereafter.  Subjective:  Fever of 101.8 overnight, he was encouraged to use incentive spirometer, he himself denies any complaints. Examination:  Awake Alert, Oriented X 3, No new F.N deficits, Normal affect Symmetrical Chest wall movement, entry at the bases with some Rales RRR,No  Gallops,Rubs or new Murmurs, No  Parasternal Heave +ve B.Sounds, Abd Soft, No tenderness, No rebound - guarding or rigidity. Right foot diabetic ulcer with surrounding erythema, left lower extremity BKA surgical incision in right lower extremity looks with no dehiscence or discharge, looks clean, dry and intact   Objective: Vitals:   02/26/22 0550 02/26/22 0600 02/26/22 0655 02/26/22 0851  BP:  125/72 105/74 113/72  Pulse:  (!) 101 (!) 109 89  Resp:  18 20 20   Temp: (!) 101.8 F (38.8 C)  98.5 F (36.9 C) 98 F (36.7 C)  TempSrc:   Oral Oral  SpO2:    97%  Weight:      Height:        Intake/Output Summary (Last 24 hours) at 02/26/2022 1118 Last data filed at 02/26/2022 0650 Gross per 24 hour  Intake 2683.81 ml  Output 2525 ml  Net 158.81 ml   Filed Weights   02/24/22 0433 02/25/22 0438 02/26/22 0500  Weight: 100.4 kg 100.6 kg 101.5 kg     Data Reviewed:   CBC: Recent Labs  Lab 02/20/22 0222 02/21/22 0120 02/22/22 0108 02/23/22 0116 02/26/22 0410  WBC 8.7 10.4 9.9 10.6* 9.5  HGB 13.3 13.5 13.0 13.4 12.3*  HCT 40.7 40.8 38.8* 38.8* 36.9*  MCV 84.8 83.8 82.6 81.9 83.1  PLT 204 226 233 254 [id number]   Basic Metabolic Panel: Recent Labs  Lab 02/20/22 0222 02/21/22 0120 02/22/22 0108 02/23/22 0116 02/26/22 0410  NA 136 137 138 141 136  K 4.1 4.2 4.1 3.9 3.9  CL 106 107 107 110 107  CO2 23 22 24 24 23   GLUCOSE 324* 345* 307* 153* 232*  BUN 11 11 12 9 8   CREATININE 0.86 0.74 0.85 0.75 0.99  CALCIUM 8.8* 8.7* 8.9 9.1 8.5*  MG 1.7 1.8 1.8 1.7  --    GFR: Estimated Creatinine Clearance: 90.1 mL/min (by C-G formula based on SCr of 0.99 mg/dL). Liver Function Tests: No results for input(s): AST, ALT, ALKPHOS, BILITOT, PROT, ALBUMIN in the last 168 hours.  No results for input(s): LIPASE, AMYLASE in the last 168 hours. No results for input(s): AMMONIA in the last 168 hours.  Coagulation Profile: No results for input(s): INR, PROTIME in the last 168 hours.  Cardiac Enzymes: No results for  input(s): CKTOTAL, CKMB, CKMBINDEX, TROPONINI in the last 168 hours. BNP (last 3 results) No results for input(s): PROBNP in the last 8760 hours. HbA1C: No results for input(s): HGBA1C in the last 72 hours.  CBG: Recent Labs  Lab 02/25/22 1325 02/25/22 1445 02/25/22 1811 02/25/22 2119 02/26/22 0641  GLUCAP 153* 175* 171* 190* 206*   Lipid Profile: Recent Labs    02/26/22 0410  CHOL 52  HDL 15*  LDLCALC 24  TRIG 65  CHOLHDL 3.5    Thyroid Function Tests: No results for input(s): TSH, T4TOTAL, FREET4, T3FREE, THYROIDAB in the last 72 hours.  Anemia Panel: No results for input(s): VITAMINB12, FOLATE, FERRITIN, TIBC, IRON, RETICCTPCT in the last 72 hours.  Sepsis Labs: No results for input(s): PROCALCITON, LATICACIDVEN in the last 168 hours.   Recent Results (from the past 240 hour(s))  MRSA Next Gen by PCR, Nasal     Status: None   Collection Time: 02/19/22  8:50 AM   Specimen: Nasal Mucosa; Nasal Swab  Result Value Ref Range Status   MRSA by PCR Next Gen NOT DETECTED NOT DETECTED Final    Comment: (NOTE) The GeneXpert MRSA Assay (FDA approved for NASAL specimens  only), is one component of a comprehensive MRSA colonization surveillance program. It is not intended to diagnose MRSA infection nor to guide or monitor treatment for MRSA infections. Test performance is not FDA approved in patients less than 11 years old. Performed at [facility], [place] [address] [place], [place] [address]   Surgical PCR screen     Status: None   Collection Time: 02/20/22  4:12 AM   Specimen: Nasal Mucosa; Nasal Swab  Result Value Ref Range Status   MRSA, PCR NEGATIVE NEGATIVE Final   Staphylococcus aureus NEGATIVE NEGATIVE Final    Comment: (NOTE) The Xpert SA Assay (FDA approved for NASAL specimens in patients 78 years of age and older), is one component of a comprehensive surveillance program. It is not intended to diagnose infection nor to guide or monitor  treatment. Performed at [facility], [place] [address], [place], [place] [address]          Radiology Studies: No results found.      Scheduled Meds:  aspirin EC  81 mg Oral Daily   atorvastatin  80 mg Oral q1800   Chlorhexidine Gluconate Cloth  6 each Topical Daily   clopidogrel  75 mg Oral Q0600   insulin aspart  0-15 Units Subcutaneous TID WC   insulin aspart  0-5 Units Subcutaneous QHS   insulin aspart  5 Units Subcutaneous TID WC   insulin glargine-yfgn  22 Units Subcutaneous BID   losartan  25 mg Oral Daily   metroNIDAZOLE  500 mg Oral Q12H   pantoprazole  40 mg Oral Daily   polyethylene glycol  17 g Oral Daily   sodium chloride flush  3 mL Intravenous Once   Continuous Infusions:  sodium chloride     sodium chloride 75 mL/hr at 02/26/22 0546   cefTRIAXone (ROCEPHIN)  IV 2 g (02/26/22 0900)   vancomycin 1,250 mg (02/26/22 0549)     LOS: 10 days       Phillips Climes, MD [facility]  If 7PM-7AM, please contact night-coverage  02/26/2022, 11:18 AM

## 2022-02-26 NOTE — Progress Notes (Signed)
Occupational Therapy Treatment Patient Details Name: Austin [name] MRN: [id number] DOB: 1956/01/22 Today's Date: 02/26/2022   History of present illness Pt is a 66 y.o. 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 Brain MRI negative for acute injury. S/p RLE arteriogram and angioplasty of external iliac artery on 5/17. S/p RLE tibial bypass graft on 5/22. Plan for R 5th ray amputation on 5/25. PMH includes PVD s/p L BKA (10/2018), DM, DVT, HTN, HLD.   OT comments  Pt progressing towards goals, session focused on ADLs, pt requiring set up A for UB bathing, mod A for LB bathing, and min A for UB dressing. Pt appearing more lethargic this session, states he is tired from surgery, however follows commands with increased time. Pt presenting with impairments listed below, will follow acutely. Continue to recommend HHOT at d/c.   Recommendations for follow up therapy are one component of a multi-disciplinary discharge planning process, led by the attending physician.  Recommendations may be updated based on patient status, additional functional criteria and insurance authorization.    Follow Up Recommendations  Home health OT    Assistance Recommended at Discharge Frequent or constant Supervision/Assistance  Patient can return home with the following  A lot of help with walking and/or transfers;A lot of help with bathing/dressing/bathroom   Equipment Recommendations  None recommended by OT    Recommendations for Other Services      Precautions / Restrictions Precautions Precautions: Fall;Other (comment) Precaution Comments: H/o L BKA (prosthetic in room) Restrictions Weight Bearing Restrictions: Yes Other Position/Activity Restrictions: WB for transfers only per podiatry       Mobility Bed Mobility               General bed mobility comments: up in chair upon arrival    Transfers                    General transfer comment: pt in chair upon arrival, wanting to remain in chair at end of session     Balance Overall balance assessment: Needs assistance Sitting-balance support: No upper extremity supported Sitting balance-Leahy Scale: Fair Sitting balance - Comments: can reach past knees without LOB                                   ADL either performed or assessed with clinical judgement   ADL Overall ADL's : Needs assistance/impaired         Upper Body Bathing: Set up;Sitting   Lower Body Bathing: Moderate assistance;Sitting/lateral leans Lower Body Bathing Details (indicate cue type and reason): to wash below knee level Upper Body Dressing : Minimal assistance;Sitting Upper Body Dressing Details (indicate cue type and reason): to don gown                        Extremity/Trunk Assessment Upper Extremity Assessment Upper Extremity Assessment: Overall WFL for tasks assessed   Lower Extremity Assessment Lower Extremity Assessment: Defer to PT evaluation        Vision   Vision Assessment?: No apparent visual deficits   Perception Perception Perception: Not tested   Praxis Praxis Praxis: Not tested    Cognition Arousal/Alertness: Awake/alert Behavior During Therapy: WFL for tasks assessed/performed, Flat affect Overall Cognitive Status: No family/caregiver present to determine baseline cognitive functioning  General Comments: drowsy, keeps eyes closed during session, however follows instructions with increased tiem        Exercises      Shoulder Instructions       General Comments VSS on RA    Pertinent Vitals/ Pain       Pain Assessment Pain Assessment: Faces Pain Score: 4  Faces Pain Scale: Hurts little more Pain Location: RLE incisions Pain Descriptors / Indicators: Grimacing, Guarding, Tightness Pain Intervention(s): Limited activity within patient's tolerance, Monitored  during session, Repositioned  Home Living                                          Prior Functioning/Environment              Frequency  Min 3X/week        Progress Toward Goals  OT Goals(current goals can now be found in the care plan section)  Progress towards OT goals: Progressing toward goals  Acute Rehab OT Goals Patient Stated Goal: to get washed up OT Goal Formulation: With patient Time For Goal Achievement: 03/07/22 Potential to Achieve Goals: Good ADL Goals Pt Will Perform Upper Body Bathing: with set-up;sitting Pt Will Perform Lower Body Bathing: with set-up;sitting/lateral leans;sit to/from stand Pt Will Perform Upper Body Dressing: with set-up;sitting Pt Will Perform Lower Body Dressing: with set-up;sit to/from stand;sitting/lateral leans Pt Will Transfer to Toilet: squat pivot transfer;with supervision Pt Will Perform Toileting - Clothing Manipulation and hygiene: sitting/lateral leans;sit to/from stand;with supervision  Plan Discharge plan remains appropriate;Frequency needs to be updated    Co-evaluation                 AM-PAC OT "6 Clicks" Daily Activity     Outcome Measure   Help from another person eating meals?: None Help from another person taking care of personal grooming?: A Little Help from another person toileting, which includes using toliet, bedpan, or urinal?: A Lot Help from another person bathing (including washing, rinsing, drying)?: A Lot Help from another person to put on and taking off regular upper body clothing?: A Lot Help from another person to put on and taking off regular lower body clothing?: A Lot 6 Click Score: 15    End of Session    OT Visit Diagnosis: Unsteadiness on feet (R26.81)   Activity Tolerance Patient tolerated treatment well   Patient Left in chair;with call bell/phone within reach   Nurse Communication Mobility status        Time: 7628-3151 OT Time Calculation (min): 26  min  Charges: OT General Charges $OT Visit: 1 Visit OT Treatments $Self Care/Home Management : 23-37 mins  Alfonzo Beers, OTD, OTR/L Acute Rehab [phone]   Austin [name] 02/26/2022, 4:21 PM

## 2022-02-26 NOTE — Progress Notes (Signed)
[facility]   Subjective  - febrile this am.   Objective 125/72 (!) 101 (!) 101.8 F (38.8 C) 18 98%  Intake/Output Summary (Last 24 hours) at 02/26/2022 0640 Last data filed at 02/26/2022 0445 Gross per 24 hour  Intake 1350 ml  Output 2450 ml  Net -1100 ml   Right groin and right leg incisions clean dry and intact with no hematoma Palpable pulse in right common femoral to TP trunk bypass in the vein graft tunneled through the saphenectomy incision Right DP and peroneal brisk by Doppler Chronic right foot wound  Laboratory Lab Results: Recent Labs    02/26/22 0410  WBC 9.5  HGB 12.3*  HCT 36.9*  PLT 214   BMET Recent Labs    02/26/22 0410  NA 136  K 3.9  CL 107  CO2 23  GLUCOSE 232*  BUN 8  CREATININE 0.99  CALCIUM 8.5*    COAG Lab Results  Component Value Date   INR 1.3 (H) 02/15/2022   INR 1.2 03/31/2021   INR 1.10 10/28/2018   No results found for: PTT  Assessment/Planning:  66 year old male now postop day 1 status post right common femoral to TP trunk bypass with ipsilateral nonreversed great saphenous vein.  All of his incisions look good.  He has a palpable pulse in the bypass graft tunneled through the saphenectomy incision.  Brisk DP and peroneal signal in the right foot.  Hemoglobin stable at 12.  Creatinine stable.  I have asked nursing to get him incentive spirometer for his fever this a.m.  Podiatry planning for surgery on Wednesday.  OK to start anticoagulation for his atrial fibrillation.  Marty Heck 02/26/2022 6:40 AM --

## 2022-02-26 NOTE — Progress Notes (Signed)
[facility]  Date of Admission:  02/15/2022           Reason for visit: Follow up on DFU/OM  Current antibiotics: Vancomycin Ceftriaxone Metronidazole  ASSESSMENT:    66 y.o. male admitted with:  Diabetic foot ulcer with OM of the right 5th metatarsal: Planning for OR with podiatry after 5pm on 5/24 to have partial 5th ray amputation. Peripheral artery disease: Status post prior left BKA.  Underwent right femoral to TP trunk bypass with reversed GSV on 5/22 with vascular surgery to optimize blood flow prior to procedure for problem #1. Uncontrolled DM: Hgb A1c at admission is 11.3. Post operative fever: Febrile this morning to 101.8.  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   Acute metabolic encephalopathy   Sepsis with acute organ dysfunction without septic shock (HCC)   Atrial fibrillation, chronic (HCC)   Osteomyelitis (HCC)    MEDICATIONS:    Scheduled Meds:  aspirin EC  81 mg Oral Daily   atorvastatin  80 mg Oral q1800   Chlorhexidine Gluconate Cloth  6 each Topical Daily   clopidogrel  75 mg Oral Q0600   insulin aspart  0-15 Units Subcutaneous TID WC   insulin aspart  0-5 Units Subcutaneous QHS   insulin aspart  5 Units Subcutaneous TID WC   insulin glargine-yfgn  20 Units Subcutaneous BID   losartan  25 mg Oral Daily   metroNIDAZOLE  500 mg Oral Q12H   pantoprazole  40 mg Oral Daily   polyethylene glycol  17 g Oral Daily   sodium chloride flush  3 mL Intravenous Once   Continuous Infusions:  sodium  chloride     sodium chloride 75 mL/hr at 02/26/22 0546   cefTRIAXone (ROCEPHIN)  IV 0 g (02/24/22 1035)   vancomycin 1,250 mg (02/26/22 0549)   PRN Meds:.sodium chloride, acetaminophen **OR** acetaminophen, alum & mag hydroxide-simeth, guaiFENesin, hydrALAZINE, ipratropium-albuterol, labetalol, metoprolol tartrate, morphine injection, ondansetron **OR** ondansetron (ZOFRAN) IV, oxyCODONE, phenol, senna-docusate, traZODone  SUBJECTIVE:   24 hour events:  S/p vascular intervention Planning for podiatry surgery tmrw Febrile  Patient reports fever yesterday evening Tolerated procedure yesterday Right leg without pain  Review of Systems  All other systems reviewed and are negative.    OBJECTIVE:   Blood pressure 125/72, pulse (!) 101, temperature (!) 101.8 F (38.8 C), resp. rate 18, height _0  (1.93 m), weight 101.5 kg, SpO2 98 %. Body mass index is 27.24 kg/m.  Physical Exam Constitutional:      General: He is not in acute distress.    Appearance: Normal appearance.  HENT:     Head: Normocephalic and atraumatic.  Eyes:     Extraocular Movements: Extraocular movements intact.     Conjunctiva/sclera: Conjunctivae normal.  Pulmonary:     Effort: Pulmonary effort is normal. No respiratory distress.  Abdominal:     General: There is no distension.     Palpations: Abdomen is soft.     Tenderness: There is no abdominal tenderness.  Musculoskeletal:     Cervical back: Normal range of motion  and neck supple.     Comments: Right leg incisions look good.  Leg is warm.   Left BKA site healed. Bandage over right 5th met wound  Skin:    General: Skin is warm and dry.     Findings: No rash.  Neurological:     General: No focal deficit present.     Mental Status: He is alert and oriented to person, place, and time.  Psychiatric:        Mood and Affect: Mood normal.        Behavior: Behavior normal.     Lab Results: Lab Results  Component Value Date   WBC 9.5 02/26/2022    HGB 12.3 (L) 02/26/2022   HCT 36.9 (L) 02/26/2022   MCV 83.1 02/26/2022   PLT 214 02/26/2022    Lab Results  Component Value Date   NA 136 02/26/2022   K 3.9 02/26/2022   CO2 23 02/26/2022   GLUCOSE 232 (H) 02/26/2022   BUN 8 02/26/2022   CREATININE 0.99 02/26/2022   CALCIUM 8.5 (L) 02/26/2022   GFRNONAA >60 02/26/2022   GFRAA >60 01/10/2020    Lab Results  Component Value Date   ALT 16 02/16/2022   AST 16 02/16/2022   ALKPHOS 64 02/16/2022   BILITOT 1.2 02/16/2022    No results found for: CRP     Component Value Date/Time   ESRSEDRATE 3 03/30/2021 0958   ESRSEDRATE 36 (H) 11/16/2013 0514     I have reviewed the micro and lab results in Epic.  Imaging: No results found.   Imaging independently reviewed in Epic.    Raynelle [facility] [phone] pager 02/26/2022, 6:15 AM  I have personally spent 50 minutes involved in face-to-face and non-face-to-face activities for this patient on the day of the visit. Professional time spent includes the following activities: Preparing to see the patient (review of tests), Obtaining and/or reviewing separately obtained history (admission/discharge record), Performing a medically appropriate examination and/or evaluation , Ordering medications/tests/procedures, referring and communicating with other health care professionals, Documenting clinical information in the EMR, Independently interpreting results (not separately reported), Communicating results to the patient/family/caregiver, Counseling and educating the patient/family/caregiver and Care coordination (not separately reported).

## 2022-02-26 NOTE — Progress Notes (Signed)
Pharmacy Antibiotic Note  Austin [name] is a 66 y.o. male admitted on 02/15/2022 with cellulitis.  Pharmacy has been consulted for vancomycin dosing. S/p aortogram and angioplasty for iliac artery. He has been on vancomycin, ceftriaxone, and metronidazole per ID. AUC was therapeutic on 5/20 on vancomycin 1250mg  q12h. Scr 0.99 - appears at baseline. Febrile x1 - 101.8. Plan for partial 5th ray amputation on 5/24.    Plan: Continue vancomycin 1250mg  q12h  Monitor renal function, cultures, clinical progression  Check vancomycin levels as indicated   Height: 6\' 4"  (193 cm) Height: 6\' 4"  (193 cm) Weight: 101.5 kg (223 lb 12.3 oz) IBW/kg (Calculated) : 86.8 IBW/kg (Calculated): 59.2 kg   Temp (24hrs), Avg:98.7 F (37.1 C), Min:97.6 F (36.4 C), Max:101.8 F (38.8 C)  Recent Labs  Lab 02/20/22 0222 02/20/22 1036 02/21/22 0120 02/22/22 0108 02/23/22 0116 02/23/22 0809 02/23/22 1758 02/26/22 0410  WBC 8.7  --  10.4 9.9 10.6*  --   --  9.5  CREATININE 0.86  --  0.74 0.85 0.75  --   --  0.99  VANCOTROUGH  --  15  --   --   --   --  12*  --   VANCOPEAK 35  --   --   --   --  27*  --   --      Estimated Creatinine Clearance: 90.1 mL/min (by C-G formula based on SCr of 0.99 mg/dL).    No Known Allergies  Antimicrobials this admission: Ceftriaxone 5/12 x 1, 5/17>>  Vancomycin 5/12 >>  Cefepime 5/13 >> 5/16 Flagyl 5/17>>  Microbiology results: 5/12 BCx: negative MRSA PCR neg  Cristela Felt, PharmD, BCPS Clinical Pharmacist 02/26/2022 7:45 AM

## 2022-02-27 ENCOUNTER — Encounter (HOSPITAL_COMMUNITY)
Admission: EM | Disposition: A | Discharge status: Rehabilitation Facility | Payer: Self-pay | Source: Home / Self Care | Attending: Internal Medicine

## 2022-02-27 ENCOUNTER — Other Ambulatory Visit: Payer: Self-pay

## 2022-02-27 ENCOUNTER — Inpatient Hospital Stay (HOSPITAL_COMMUNITY): Payer: Medicare Other | Admitting: Certified Registered"

## 2022-02-27 ENCOUNTER — Encounter (HOSPITAL_COMMUNITY): Payer: Self-pay | Admitting: Internal Medicine

## 2022-02-27 ENCOUNTER — Other Ambulatory Visit (HOSPITAL_COMMUNITY): Payer: Self-pay

## 2022-02-27 ENCOUNTER — Inpatient Hospital Stay (HOSPITAL_COMMUNITY): Payer: Medicare Other

## 2022-02-27 DIAGNOSIS — I10 Essential (primary) hypertension: Secondary | ICD-10-CM | POA: Diagnosis not present

## 2022-02-27 DIAGNOSIS — Z794 Long term (current) use of insulin: Secondary | ICD-10-CM | POA: Diagnosis not present

## 2022-02-27 DIAGNOSIS — E1169 Type 2 diabetes mellitus with other specified complication: Secondary | ICD-10-CM

## 2022-02-27 DIAGNOSIS — E1151 Type 2 diabetes mellitus with diabetic peripheral angiopathy without gangrene: Secondary | ICD-10-CM | POA: Diagnosis not present

## 2022-02-27 DIAGNOSIS — I482 Chronic atrial fibrillation, unspecified: Secondary | ICD-10-CM | POA: Diagnosis not present

## 2022-02-27 DIAGNOSIS — M869 Osteomyelitis, unspecified: Secondary | ICD-10-CM | POA: Diagnosis not present

## 2022-02-27 DIAGNOSIS — E11621 Type 2 diabetes mellitus with foot ulcer: Secondary | ICD-10-CM | POA: Diagnosis not present

## 2022-02-27 DIAGNOSIS — G9341 Metabolic encephalopathy: Secondary | ICD-10-CM | POA: Diagnosis not present

## 2022-02-27 DIAGNOSIS — M86671 Other chronic osteomyelitis, right ankle and foot: Secondary | ICD-10-CM

## 2022-02-27 HISTORY — PX: AMPUTATION: SHX166

## 2022-02-27 LAB — GLUCOSE, CAPILLARY
Glucose-Capillary: 162 mg/dL — ABNORMAL HIGH (ref 70–99)
Glucose-Capillary: 192 mg/dL — ABNORMAL HIGH (ref 70–99)
Glucose-Capillary: 174 mg/dL — ABNORMAL HIGH (ref 70–99)
Glucose-Capillary: 157 mg/dL — ABNORMAL HIGH (ref 70–99)
Glucose-Capillary: 271 mg/dL — ABNORMAL HIGH (ref 70–99)

## 2022-02-27 LAB — CBC
HCT: 34.5 % — ABNORMAL LOW (ref 39.0–52.0)
Hemoglobin: 11.5 g/dL — ABNORMAL LOW (ref 13.0–17.0)
MCH: 27.7 pg (ref 26.0–34.0)
MCHC: 33.3 g/dL (ref 30.0–36.0)
MCV: 83.1 fL (ref 80.0–100.0)
Platelets: 190 10*3/uL (ref 150–400)
RBC: 4.15 MIL/uL — ABNORMAL LOW (ref 4.22–5.81)
RDW: 15.4 % (ref 11.5–15.5)
WBC: 9.8 10*3/uL (ref 4.0–10.5)
nRBC: 0 % (ref 0.0–0.2)

## 2022-02-27 LAB — BASIC METABOLIC PANEL
Anion gap: 6 (ref 5–15)
BUN: 7 mg/dL — ABNORMAL LOW (ref 8–23)
CO2: 22 mmol/L (ref 22–32)
Calcium: 8.5 mg/dL — ABNORMAL LOW (ref 8.9–10.3)
Chloride: 108 mmol/L (ref 98–111)
Creatinine, Ser: 0.81 mg/dL (ref 0.61–1.24)
GFR, Estimated: >60 mL/min (ref >60)
Glucose, Bld: 167 mg/dL — ABNORMAL HIGH (ref 70–99)
Potassium: 3.7 mmol/L (ref 3.5–5.1)
Sodium: 136 mmol/L (ref 135–145)

## 2022-02-27 SURGERY — AMPUTATION, FOOT, PARTIAL
Anesthesia: General | Site: Foot | Laterality: Right

## 2022-02-27 MED ORDER — DEXAMETHASONE SODIUM PHOSPHATE 10 MG/ML IJ SOLN
INTRAMUSCULAR | Status: DC | PRN
  Administered 2022-02-27: 5 mg via INTRAVENOUS

## 2022-02-27 MED ORDER — ACETAMINOPHEN 500 MG PO TABS
1000.0000 mg | ORAL_TABLET | Freq: Once | ORAL | Status: AC
  Administered 2022-02-27: 1000 mg via ORAL

## 2022-02-27 MED ORDER — CHLORHEXIDINE GLUCONATE 0.12 % MT SOLN
OROMUCOSAL | Status: AC
  Filled 2022-02-27: qty 15

## 2022-02-27 MED ORDER — HYDROMORPHONE HCL 1 MG/ML IJ SOLN: 0.2500 mg | INTRAMUSCULAR | Status: DC | PRN

## 2022-02-27 MED ORDER — LIDOCAINE HCL 2 % IJ SOLN
INTRAMUSCULAR | Status: DC | PRN
  Administered 2022-02-27: 7.5 mL

## 2022-02-27 MED ORDER — INSULIN ASPART 100 UNIT/ML IJ SOLN
INTRAMUSCULAR | Status: AC
  Filled 2022-02-27: qty 1

## 2022-02-27 MED ORDER — MEPERIDINE HCL 25 MG/ML IJ SOLN: 6.2500 mg | INTRAMUSCULAR | Status: DC | PRN

## 2022-02-27 MED ORDER — MIDAZOLAM HCL 2 MG/2ML IJ SOLN: 0.5000 mg | Freq: Once | INTRAMUSCULAR | Status: DC | PRN

## 2022-02-27 MED ORDER — OXYCODONE HCL 5 MG/5ML PO SOLN: 5.0000 mg | Freq: Once | ORAL | Status: DC | PRN

## 2022-02-27 MED ORDER — PROPOFOL 10 MG/ML IV BOLUS
INTRAVENOUS | Status: DC | PRN
  Administered 2022-02-27: 100 mg via INTRAVENOUS
  Administered 2022-02-27: 50 mg via INTRAVENOUS

## 2022-02-27 MED ORDER — BUPIVACAINE HCL 0.5 % IJ SOLN
INTRAMUSCULAR | Status: AC
  Filled 2022-02-27: qty 1

## 2022-02-27 MED ORDER — ONDANSETRON HCL 4 MG/2ML IJ SOLN
INTRAMUSCULAR | Status: DC | PRN
  Administered 2022-02-27: 4 mg via INTRAVENOUS

## 2022-02-27 MED ORDER — 0.9 % SODIUM CHLORIDE (POUR BTL) OPTIME
TOPICAL | Status: DC | PRN
  Administered 2022-02-27: 1000 mL

## 2022-02-27 MED ORDER — PHENYLEPHRINE HCL-NACL 20-0.9 MG/250ML-% IV SOLN
INTRAVENOUS | Status: DC | PRN
  Administered 2022-02-27: 25 ug/min via INTRAVENOUS

## 2022-02-27 MED ORDER — OXYCODONE HCL 5 MG PO TABS: 5.0000 mg | ORAL_TABLET | Freq: Once | ORAL | Status: DC | PRN

## 2022-02-27 MED ORDER — BUPIVACAINE HCL (PF) 0.5 % IJ SOLN
INTRAMUSCULAR | Status: DC | PRN
  Administered 2022-02-27: 7.5 mL

## 2022-02-27 MED ORDER — FENTANYL CITRATE (PF) 250 MCG/5ML IJ SOLN
INTRAMUSCULAR | Status: DC | PRN
  Administered 2022-02-27: 25 ug via INTRAVENOUS
  Administered 2022-02-27: 50 ug via INTRAVENOUS

## 2022-02-27 MED ORDER — BUPIVACAINE HCL (PF) 0.5 % IJ SOLN
INTRAMUSCULAR | Status: AC
  Filled 2022-02-27: qty 10

## 2022-02-27 MED ORDER — ACETAMINOPHEN 500 MG PO TABS
ORAL_TABLET | ORAL | Status: AC
  Filled 2022-02-27: qty 2

## 2022-02-27 MED ORDER — LIDOCAINE-EPINEPHRINE 2 %-1:100000 IJ SOLN
INTRAMUSCULAR | Status: AC
  Filled 2022-02-27: qty 1

## 2022-02-27 MED ORDER — INSULIN ASPART 100 UNIT/ML IJ SOLN
0.0000 [IU] | INTRAMUSCULAR | Status: DC | PRN
  Administered 2022-02-27: 2 [IU] via SUBCUTANEOUS

## 2022-02-27 MED ORDER — PHENYLEPHRINE 80 MCG/ML (10ML) SYRINGE FOR IV PUSH (FOR BLOOD PRESSURE SUPPORT)
PREFILLED_SYRINGE | INTRAVENOUS | Status: DC | PRN
  Administered 2022-02-27 (×3): 160 ug via INTRAVENOUS

## 2022-02-27 MED ORDER — MIDAZOLAM HCL 2 MG/2ML IJ SOLN
INTRAMUSCULAR | Status: AC
  Filled 2022-02-27: qty 2

## 2022-02-27 MED ORDER — FENTANYL CITRATE (PF) 250 MCG/5ML IJ SOLN
INTRAMUSCULAR | Status: AC
  Filled 2022-02-27: qty 5

## 2022-02-27 MED ORDER — LACTATED RINGERS IV SOLN: INTRAVENOUS | Status: DC | PRN

## 2022-02-27 MED ORDER — LIDOCAINE 2% (20 MG/ML) 5 ML SYRINGE
INTRAMUSCULAR | Status: DC | PRN
  Administered 2022-02-27: 40 mg via INTRAVENOUS

## 2022-02-27 MED ORDER — LIDOCAINE HCL 2 % IJ SOLN
INTRAMUSCULAR | Status: AC
  Filled 2022-02-27: qty 40

## 2022-02-27 SURGICAL SUPPLY — 56 items
APL PRP STRL LF DISP 70% ISPRP (MISCELLANEOUS)
APL SKNCLS STERI-STRIP NONHPOA (GAUZE/BANDAGES/DRESSINGS) ×1
BAG COUNTER SPONGE SURGICOUNT (BAG) ×2 IMPLANT
BAG SPNG CNTER NS LX DISP (BAG) ×1
BENZOIN TINCTURE PRP APPL 2/3 (GAUZE/BANDAGES/DRESSINGS) ×2 IMPLANT
BLADE AVERAGE 25X9 (BLADE) ×1 IMPLANT
BLADE OSCILLATING /SAGITTAL (BLADE) ×2 IMPLANT
BLADE SURG 15 STRL LF DISP TIS (BLADE) IMPLANT
BLADE SURG 15 STRL SS (BLADE)
BNDG CMPR 9X4 STRL LF SNTH (GAUZE/BANDAGES/DRESSINGS) ×1
BNDG ELASTIC 4X5.8 VLCR STR LF (GAUZE/BANDAGES/DRESSINGS) ×1 IMPLANT
BNDG ESMARK 4X9 LF (GAUZE/BANDAGES/DRESSINGS) ×1 IMPLANT
BNDG GAUZE ELAST 4 BULKY (GAUZE/BANDAGES/DRESSINGS) ×2 IMPLANT
CHLORAPREP W/TINT 26 (MISCELLANEOUS) IMPLANT
CNTNR URN SCR LID CUP LEK RST (MISCELLANEOUS) IMPLANT
CONT SPEC 4OZ STRL OR WHT (MISCELLANEOUS) ×2
COVER SURGICAL LIGHT HANDLE (MISCELLANEOUS) ×2 IMPLANT
CUFF TOURN SGL QUICK 18 NS (TOURNIQUET CUFF) ×1 IMPLANT
CUFF TOURN SGL QUICK 18X4 (TOURNIQUET CUFF) ×2 IMPLANT
CUFF TOURN SGL QUICK 24 (TOURNIQUET CUFF)
CUFF TRNQT CYL 24X4X16.5-23 (TOURNIQUET CUFF) IMPLANT
DRSG ADAPTIC 3X8 NADH LF (GAUZE/BANDAGES/DRESSINGS) ×1 IMPLANT
DRSG PAD ABDOMINAL 8X10 ST (GAUZE/BANDAGES/DRESSINGS) ×2 IMPLANT
ELECT REM PT RETURN 9FT ADLT (ELECTROSURGICAL)
ELECTRODE REM PT RTRN 9FT ADLT (ELECTROSURGICAL) IMPLANT
GAUZE PACKING IODOFORM 1/4X15 (PACKING) ×2 IMPLANT
GAUZE SPONGE 4X4 12PLY STRL (GAUZE/BANDAGES/DRESSINGS) ×2 IMPLANT
GAUZE SPONGE 4X4 12PLY STRL LF (GAUZE/BANDAGES/DRESSINGS) ×1 IMPLANT
GAUZE XEROFORM 1X8 LF (GAUZE/BANDAGES/DRESSINGS) ×2 IMPLANT
GLOVE BIO SURGEON STRL SZ8 (GLOVE) ×2 IMPLANT
GLOVE BIOGEL PI IND STRL 8 (GLOVE) ×1 IMPLANT
GLOVE BIOGEL PI INDICATOR 8 (GLOVE) ×1
GOWN STRL REUS W/ TWL LRG LVL3 (GOWN DISPOSABLE) ×2 IMPLANT
GOWN STRL REUS W/TWL LRG LVL3 (GOWN DISPOSABLE) ×4
KIT BASIN OR (CUSTOM PROCEDURE TRAY) ×2 IMPLANT
KIT TURNOVER KIT B (KITS) ×2 IMPLANT
NDL HYPO 25GX1X1/2 BEV (NEEDLE) IMPLANT
NDL PRECISIONGLIDE 27X1.5 (NEEDLE) ×1 IMPLANT
NEEDLE HYPO 25GX1X1/2 BEV (NEEDLE) ×4 IMPLANT
NEEDLE PRECISIONGLIDE 27X1.5 (NEEDLE) ×2 IMPLANT
NS IRRIG 1000ML POUR BTL (IV SOLUTION) ×2 IMPLANT
PACK ORTHO EXTREMITY (CUSTOM PROCEDURE TRAY) ×2 IMPLANT
PAD ARMBOARD 7.5X6 YLW CONV (MISCELLANEOUS) ×4 IMPLANT
PAD CAST 4YDX4 CTTN HI CHSV (CAST SUPPLIES) IMPLANT
PADDING CAST COTTON 4X4 STRL (CAST SUPPLIES) ×2
SOL PREP POV-IOD 4OZ 10% (MISCELLANEOUS) ×2 IMPLANT
STAPLER VISISTAT 35W (STAPLE) ×1 IMPLANT
STRIP CLOSURE SKIN 1/2X4 (GAUZE/BANDAGES/DRESSINGS) ×2 IMPLANT
SUT ETHILON 2 0 FS 18 (SUTURE) ×1 IMPLANT
SUT PROLENE 4 0 PS 2 18 (SUTURE) IMPLANT
SUT VIC AB 3-0 PS2 18 (SUTURE) IMPLANT
SUT VICRYL 4-0 PS2 18IN ABS (SUTURE) ×2 IMPLANT
SYR CONTROL 10ML LL (SYRINGE) ×4 IMPLANT
TOWEL GREEN STERILE (TOWEL DISPOSABLE) ×2 IMPLANT
TUBE CONNECTING 12X1/4 (SUCTIONS) ×2 IMPLANT
YANKAUER SUCT BULB TIP NO VENT (SUCTIONS) IMPLANT

## 2022-02-27 NOTE — Progress Notes (Signed)
PROGRESS NOTE    Austin [name]  XBJ:[id number] DOB: 12/22/1955 DOA: 02/15/2022 PCP: [name], [name], MD   Brief Narrative:   Patient is a 66 year old male with past medical history of, hypertension, peripheral vascular disease, atrial fibrillation on Eliquis, hypertension, status post left BKA presented to hospital with weakness and altered mental status.,  Patient was noted to be in sepsis secondary to diabetic foot ulcer.  MRI of the brain was negative.  MRI of the foot was positive for fifth digit osteomyelitis.   Podiatry, infectious disease and vascular team were consulted. Vascular surgery did right iliac artery stent 02/20/2022, followed by right common femoral to TP trunk bypass with ipsilateral nonreversed great saphenous vein on 5/22, plan for partial amputation of the fifth ray of the right foot Wednesday by podiatry.  Assessment & Plan:  Principal Problem:   Diabetic foot ulcer associated with type 2 diabetes mellitus (HCC) Active Problems:   Acute metabolic encephalopathy   Sepsis with acute organ dysfunction without septic shock (HCC)   Essential hypertension   Type 2 diabetes mellitus with diabetic peripheral angiopathy without gangrene (HCC)   Status post below-knee amputation (HCC) - left side   Atrial fibrillation, chronic (HCC)   Osteomyelitis (HCC)   Sepsis with acute organ dysfunction without septic shock Infected diabetic right foot fifth digit osteomyelitis Podiatry, vascular surgery and infectious disease on board.  Previous history of PCI and balloon angioplasty of right lower extremity, stent placement of left iliac.  Patient underwent right iliac artery stent 02/20/2022, right common femoral to TP trunk bypass with ipsilateral nonreversed great saphenous vein on 5/22.  ID on board and currently on Rocephin, Flagyl and vancomycin.  Temperature max of 98.2 F. Plan for partial amputation of the fifth ray of the right foot on 02/27/2022.  Diabetic foot ulcer  associated with type 2 diabetes mellitus (HCC) Type 2 diabetes mellitus with diabetic peripheral angiopathy without gangrene (HCC) Continue Semglee, mealtime and sliding scale insulin while in the hospital.  Acute metabolic encephalopathy Resolved at this time.  MRI brain with remote infarct.  Seen by neurology.  TSH and ammonia within normal limits  Atrial fibrillation, chronic (HCC) On Eliquis as outpatient.  Currently on hold.  Received Lovenox yesterday  Left below-knee amputation (HCC) Chronic. Stable.  Essential hypertension Continue cozaar  DVT prophylaxis: Eliquis on hold.  Code Status: Full code  Family Communication: Communicated with the patient at bedside  Status is: Inpatient  Remains inpatient appropriate because:  IV antibiotics, status post vascular intervention, awaiting for partial amputation.  Subjective: Today, patient was seen and examined at bedside.  Denies any nausea vomiting fever chills.  Denies overt pain.  Awaiting for surgical intervention.   Objective: Vitals:   02/26/22 2343 02/27/22 0410 02/27/22 0815 02/27/22 1144  BP: 124/67 122/73 131/72 131/66  Pulse: 91 93 94 84  Resp: 20 19 18 15   Temp: 98 F (36.7 C) 98.2 F (36.8 C) (!) 97.4 F (36.3 C) (!) 97.4 F (36.3 C)  TempSrc: Oral Oral Oral Oral  SpO2: 94% 97% 96% 95%  Weight:  101.6 kg    Height:        Intake/Output Summary (Last 24 hours) at 02/27/2022 1306 Last data filed at 02/27/2022 0811 Gross per 24 hour  Intake 1285.96 ml  Output 2100 ml  Net -814.04 ml    Filed Weights   02/25/22 0438 02/26/22 0500 02/27/22 0410  Weight: 100.6 kg 101.5 kg 101.6 kg   Physical examination: General:  Average  built, not in obvious distress HENT:   No scleral pallor or icterus noted. Oral mucosa is moist.  Chest:  Clear breath sounds.  Diminished breath sounds bilaterally. No crackles or wheezes.  CVS: S1 &S2 heard. No murmur.  Regular rate and rhythm. Abdomen: Soft, nontender,  nondistended.  Bowel sounds are heard.   Extremities: No cyanosis, clubbing or edema.  Peripheral pulses are palpable.  Left below-knee amputation.  Right foot diabetic ulcer with surrounding erythema.  Status post right leg vascular surgery intervention with staples Psych: Alert, awake and oriented, normal mood CNS:  No cranial nerve deficits.  Power equal in all extremities.   Skin: Warm and dry.  No rashes noted.   Data Reviewed:   CBC: Recent Labs  Lab 02/21/22 0120 02/22/22 0108 02/23/22 0116 02/26/22 0410 02/27/22 0320  WBC 10.4 9.9 10.6* 9.5 9.8  HGB 13.5 13.0 13.4 12.3* 11.5*  HCT 40.8 38.8* 38.8* 36.9* 34.5*  MCV 83.8 82.6 81.9 83.1 83.1  PLT 226 233 254 214 190    Basic Metabolic Panel: Recent Labs  Lab 02/21/22 0120 02/22/22 0108 02/23/22 0116 02/26/22 0410 02/27/22 0320  NA 137 138 141 136 136  K 4.2 4.1 3.9 3.9 3.7  CL 107 107 110 107 108  CO2 22 24 24 23 22   GLUCOSE 345* 307* 153* 232* 167*  BUN 11 12 9 8  7*  CREATININE 0.74 0.85 0.75 0.99 0.81  CALCIUM 8.7* 8.9 9.1 8.5* 8.5*  MG 1.8 1.8 1.7  --   --     GFR: Estimated Creatinine Clearance: 110.1 mL/min (by C-G formula based on SCr of 0.81 mg/dL). Liver Function Tests: No results for input(s): AST, ALT, ALKPHOS, BILITOT, PROT, ALBUMIN in the last 168 hours.  No results for input(s): LIPASE, AMYLASE in the last 168 hours. No results for input(s): AMMONIA in the last 168 hours.  Coagulation Profile: No results for input(s): INR, PROTIME in the last 168 hours.  Cardiac Enzymes: No results for input(s): CKTOTAL, CKMB, CKMBINDEX, TROPONINI in the last 168 hours. BNP (last 3 results) No results for input(s): PROBNP in the last 8760 hours. HbA1C: No results for input(s): HGBA1C in the last 72 hours.  CBG: Recent Labs  Lab 02/26/22 1125 02/26/22 1605 02/26/22 2117 02/27/22 0634 02/27/22 1143  GLUCAP 215* 206* 195* 162* 192*    Lipid Profile: Recent Labs    02/26/22 0410  CHOL 52  HDL  15*  LDLCALC 24  TRIG 65  CHOLHDL 3.5     Thyroid Function Tests: No results for input(s): TSH, T4TOTAL, FREET4, T3FREE, THYROIDAB in the last 72 hours.  Anemia Panel: No results for input(s): VITAMINB12, FOLATE, FERRITIN, TIBC, IRON, RETICCTPCT in the last 72 hours.  Sepsis Labs: No results for input(s): PROCALCITON, LATICACIDVEN in the last 168 hours.   Recent Results (from the past 240 hour(s))  MRSA Next Gen by PCR, Nasal     Status: None   Collection Time: 02/19/22  8:50 AM   Specimen: Nasal Mucosa; Nasal Swab  Result Value Ref Range Status   MRSA by PCR Next Gen NOT DETECTED NOT DETECTED Final    Comment: (NOTE) The GeneXpert MRSA Assay (FDA approved for NASAL specimens only), is one component of a comprehensive MRSA colonization surveillance program. It is not intended to diagnose MRSA infection nor to guide or monitor treatment for MRSA infections. Test performance is not FDA approved in patients less than 48 years old. Performed at [facility], [address], [place],  [place] [address]   Surgical PCR screen     Status: None   Collection Time: 02/20/22  4:12 AM   Specimen: Nasal Mucosa; Nasal Swab  Result Value Ref Range Status   MRSA, PCR NEGATIVE NEGATIVE Final   Staphylococcus aureus NEGATIVE NEGATIVE Final    Comment: (NOTE) The Xpert SA Assay (FDA approved for NASAL specimens in patients 30 years of age and older), is one component of a comprehensive surveillance program. It is not intended to diagnose infection nor to guide or monitor treatment. Performed at [facility], [address] [place], [place] [address]   Culture, blood (Routine X 2) w Reflex to ID Panel     Status: None (Preliminary result)   Collection Time: 02/26/22 12:22 PM   Specimen: BLOOD RIGHT HAND  Result Value Ref Range Status   Specimen Description BLOOD RIGHT HAND  Final   Special Requests   Final    AEROBIC BOTTLE ONLY Blood Culture results may not be optimal due  to an inadequate volume of blood received in culture bottles   Culture   Final    NO GROWTH < 24 HOURS Performed at [facility], [address], [place], [place] [address]    Report Status PENDING  Incomplete  Culture, blood (Routine X 2) w Reflex to ID Panel     Status: None (Preliminary result)   Collection Time: 02/26/22 12:25 PM   Specimen: BLOOD RIGHT HAND  Result Value Ref Range Status   Specimen Description BLOOD RIGHT HAND  Final   Special Requests   Final    AEROBIC BOTTLE ONLY Blood Culture results may not be optimal due to an inadequate volume of blood received in culture bottles   Culture   Final    NO GROWTH < 24 HOURS Performed at [facility], [address] [place], [place] [address]    Report Status PENDING  Incomplete      Radiology Studies: No results found.   Scheduled Meds:  aspirin EC  81 mg Oral Daily   atorvastatin  80 mg Oral q1800   Chlorhexidine Gluconate Cloth  6 each Topical Daily   clopidogrel  75 mg Oral Q0600   insulin aspart  0-15 Units Subcutaneous TID WC   insulin aspart  0-5 Units Subcutaneous QHS   insulin aspart  5 Units Subcutaneous TID WC   insulin glargine-yfgn  22 Units Subcutaneous BID   losartan  25 mg Oral Daily   metroNIDAZOLE  500 mg Oral Q12H   pantoprazole  40 mg Oral Daily   polyethylene glycol  17 g Oral Daily   sodium chloride flush  3 mL Intravenous Once   Continuous Infusions:  sodium chloride     sodium chloride 75 mL/hr at 02/27/22 0808   cefTRIAXone (ROCEPHIN)  IV 2 g (02/27/22 0811)   vancomycin 1,250 mg (02/27/22 0552)     LOS: 11 days     Joycelyn Das, MD [facility] If 7PM-7AM, please contact night-coverage 02/27/2022, 1:06 PM

## 2022-02-27 NOTE — Interval H&P Note (Signed)
History and Physical Interval Note:  02/27/2022 5:04 PM  Austin [name]  has presented today for surgery, with the diagnosis of Ostemyelitis.  The various methods of treatment have been discussed with the patient and family. After consideration of risks, benefits and other options for treatment, the patient has consented to  Procedure(s): RIGHT PARTIAL AMPUTATION FOOT (Right) as a surgical intervention.  [name] history has been reviewed, patient examined, no change in status, stable for surgery.  I have reviewed the patient's chart and labs.  Questions were answered to the patient's satisfaction.     Felecia Shelling

## 2022-02-27 NOTE — Transfer of Care (Signed)
Immediate Anesthesia Transfer of Care Note  Patient: Austin [name]  Procedure(s) Performed: RIGHT PARTIAL AMPUTATION FOOT (Right: Foot)  Patient Location: PACU  Anesthesia Type:General  Level of Consciousness: awake  Airway & Oxygen Therapy: Patient Spontanous Breathing  Post-op Assessment: Report given to RN  Post vital signs: Reviewed  Last Vitals:  Vitals Value Taken Time  BP 106/62 02/27/22 1950  Temp 36.6 C 02/27/22 1950  Pulse 25 02/27/22 2020  Resp 20 02/27/22 2107  SpO2 96 % 02/27/22 2020  Vitals shown include unvalidated device data.  Last Pain:  Vitals:   02/27/22 1950  TempSrc: Oral  PainSc: 0-No pain      Patients Stated Pain Goal: 1 (02/20/22 2213)  Complications: No notable events documented.

## 2022-02-27 NOTE — Anesthesia Postprocedure Evaluation (Signed)
Anesthesia Post Note  Patient: Austin [name]  Procedure(s) Performed: RIGHT PARTIAL AMPUTATION FOOT (Right: Foot)     Patient location during evaluation: PACU Anesthesia Type: General Level of consciousness: awake and alert Pain management: pain level controlled Vital Signs Assessment: post-procedure vital signs reviewed and stable Respiratory status: spontaneous breathing, nonlabored ventilation, respiratory function stable and patient connected to nasal cannula oxygen Cardiovascular status: blood pressure returned to baseline and stable Postop Assessment: no apparent nausea or vomiting Anesthetic complications: no   No notable events documented.  Last Vitals:  Vitals:   02/27/22 1930 02/27/22 1950  BP: 113/68 106/62  Pulse: 95 83  Resp: (!) 24 17  Temp: (!) 36.4 C 36.6 C  SpO2: 94% 94%    Last Pain:  Vitals:   02/27/22 1950  TempSrc: Oral  PainSc: 0-No pain                 Earl Lites P Karishma Unrein

## 2022-02-27 NOTE — Anesthesia Preprocedure Evaluation (Addendum)
Anesthesia Evaluation  Patient identified by MRN, date of birth, ID band Patient awake    Reviewed: Allergy & Precautions, NPO status , Patient's Chart, lab work & pertinent test results  History of Anesthesia Complications Negative for: history of anesthetic complications  Airway Mallampati: I  TM Distance: >3 FB Neck ROM: Full    Dental  (+) Poor Dentition, Missing, Dental Advisory Given   Pulmonary former smoker,    breath sounds clear to auscultation       Cardiovascular hypertension, + Peripheral Vascular Disease and + DVT  + dysrhythmias Atrial Fibrillation  Rhythm:Irregular Rate:Normal  '17 ECHO: INTERPRETATION  NORMAL LEFT VENTRICULAR SYSTOLIC FUNCTION WITH AN ESTIMATED EF = 50-55 %  NORMAL RIGHT VENTRICULAR SYSTOLIC FUNCTION  TRIVIAL TRICUSPID AND MITRAL VALVE INSUFFICIENCY  NO VALVULAR STENOSIS   '17 stress: normal perfusion   Neuro/Psych Peripheral neuropathy    GI/Hepatic Neg liver ROS, GERD  Controlled,  Endo/Other  diabetes, Insulin Dependent  Renal/GU negative Renal ROS     Musculoskeletal   Abdominal   Peds  Hematology eliquis   Anesthesia Other Findings   Reproductive/Obstetrics                            Anesthesia Physical Anesthesia Plan  ASA: 3  Anesthesia Plan: General   Post-op Pain Management: Tylenol PO (pre-op)*   Induction: Intravenous  PONV Risk Score and Plan: 2 and Ondansetron and Dexamethasone  Airway Management Planned: LMA  Additional Equipment: None  Intra-op Plan:   Post-operative Plan:   Informed Consent: I have reviewed the patients History and Physical, chart, labs and discussed the procedure including the risks, benefits and alternatives for the proposed anesthesia with the patient or authorized representative who has indicated his/her understanding and acceptance.     Dental advisory given  Plan Discussed with: CRNA and  Surgeon  Anesthesia Plan Comments:        Anesthesia Quick Evaluation

## 2022-02-27 NOTE — Progress Notes (Addendum)
Progress Note    02/27/2022 7:33 AM 2 Days Post-Op  Subjective:  says he has a little pain in the right leg  afebrile  Vitals:   02/26/22 2343 02/27/22 0410  BP: 124/67 122/73  Pulse: 91 93  Resp: 20 19  Temp: 98 F (36.7 C) 98.2 F (36.8 C)  SpO2: 94% 97%    Physical Exam: Cardiac:  irregular Lungs:  non labored Incisions:  all incisions look good Extremities:  brisk right DP and peroneal doppler signals and palpable graft pulse at the knee   CBC    Component Value Date/Time   WBC 9.8 02/27/2022 0320   RBC 4.15 (L) 02/27/2022 0320   HGB 11.5 (L) 02/27/2022 0320   HGB 14.1 11/17/2013 0621   HCT 34.5 (L) 02/27/2022 0320   HCT 41.0 02/16/2022 0608   PLT 190 02/27/2022 0320   PLT 218 11/17/2013 0621   MCV 83.1 02/27/2022 0320   MCV 86 11/17/2013 0621   MCH 27.7 02/27/2022 0320   MCHC 33.3 02/27/2022 0320   RDW 15.4 02/27/2022 0320   RDW 13.6 11/17/2013 0621   LYMPHSABS 2.2 02/16/2022 0530   LYMPHSABS 2.4 11/17/2013 0621   MONOABS 1.3 (H) 02/16/2022 0530   MONOABS 0.9 11/17/2013 0621   EOSABS 0.0 02/16/2022 0530   EOSABS 0.1 11/17/2013 0621   BASOSABS 0.0 02/16/2022 0530   BASOSABS 0.0 11/17/2013 0621    BMET    Component Value Date/Time   NA 136 02/27/2022 0320   NA 135 (L) 11/17/2013 0621   K 3.7 02/27/2022 0320   K 4.3 11/17/2013 0621   CL 108 02/27/2022 0320   CL 99 11/17/2013 0621   CO2 22 02/27/2022 0320   CO2 31 11/17/2013 0621   GLUCOSE 167 (H) 02/27/2022 0320   GLUCOSE 172 (H) 11/17/2013 0621   BUN 7 (L) 02/27/2022 0320   BUN 10 11/17/2013 0621   CREATININE 0.81 02/27/2022 0320   CREATININE 0.85 11/17/2013 0621   CALCIUM 8.5 (L) 02/27/2022 0320   CALCIUM 9.6 11/17/2013 0621   GFRNONAA >60 02/27/2022 0320   GFRNONAA >60 11/17/2013 0621   GFRAA >60 01/10/2020 0808   GFRAA >60 11/17/2013 0621    INR    Component Value Date/Time   INR 1.3 (H) 02/15/2022 2051     Intake/Output Summary (Last 24 hours) at 02/27/2022 0733 Last  data filed at 02/27/2022 0222 Gross per 24 hour  Intake 1045.96 ml  Output 2175 ml  Net -1129.04 ml     Assessment/Plan:  66 y.o. male is s/p:   right common femoral to TP trunk bypass with ipsilateral nonreversed great saphenous vein  2 Days Post-Op   -pt with brisk right DP and peroneal doppler signals and palpable graft pulse at the knee -continue PT/OT (they are recommending HHPT/OT) -DVT prophylaxis:  most likely to restart tomorrow given he is going to OR later today.  He received a dose of Lovenox last evening.  -dry gauze to right groin to wick moisture to help prevent wound infection-order placed.    Doreatha Massed, PA-C [facility] [phone] 02/27/2022 7:33 AM  I have seen and evaluated the patient. I agree with the PA note as documented above.  Postop day 2 status post right common femoral to TP trunk bypass with ipsilateral nonreversed great saphenous vein for CLI with tissue loss.  Palpable pulse in the bypass graft tunneled through the saphenectomy incision.  Brisk Doppler signals in the foot including DP and peroneal.  Would  recommend aspirin Plavix for right iliac stent for at least 1 month and then can go to just aspirin.  OR today with podiatry.     Marty Heck, MD [facility] [place] Office: [phone]

## 2022-02-27 NOTE — TOC Benefit Eligibility Note (Signed)
Patient Product/process development scientist completed.    The patient is currently admitted and upon discharge could be taking ezetimibe (Zetia) 10 mg.  The current 30 day co-pay is, $0.00.   The patient is insured through [employer] Part D    Roland Earl, CPhT Pharmacy Patient Advocate Specialist [facility] Patient Advocate Team Direct Number: [phone]  Fax: [phone]

## 2022-02-27 NOTE — Brief Op Note (Signed)
02/27/2022  6:57 PM  PATIENT:  Austin [name]  66 y.o. male  PRE-OPERATIVE DIAGNOSIS:  Ostemyelitis  POST-OPERATIVE DIAGNOSIS:  Ostemyelitis  PROCEDURE:  Procedure(s): RIGHT PARTIAL AMPUTATION FOOT (Right)  SURGEON:  Surgeon(s) and Role:    Edrick Kins, DPM - Primary  PHYSICIAN ASSISTANT:   ASSISTANTS: none   ANESTHESIA:   local and IV sedation  EBL:  minimal   BLOOD ADMINISTERED:none  DRAINS: none   LOCAL MEDICATIONS USED: 0.5% MARCAINE plain   and 2% LIDOCAINE plain totaling 39mL combined a 50-50 mixture and a local block fashion  SPECIMEN: Fifth metatarsal RT foot sent for path and culture  DISPOSITION OF SPECIMEN:  PATHOLOGY  COUNTS:  YES  TOURNIQUET:  * Missing tourniquet times found for documented tourniquets in log: [id number] *  DICTATION: .Dragon Dictation  PLAN OF CARE: Admit to inpatient   PATIENT DISPOSITION:  PACU - hemodynamically stable.   Delay start of Pharmacological VTE agent (>24hrs) due to surgical blood loss or risk of bleeding: no

## 2022-02-27 NOTE — Anesthesia Procedure Notes (Addendum)
Procedure Name: LMA Insertion Date/Time: 02/27/2022 5:50 PM Performed by: Lelon Perla, CRNA Pre-anesthesia Checklist: Patient identified, Emergency Drugs available, Suction available and Patient being monitored Patient Re-evaluated:Patient Re-evaluated prior to induction Oxygen Delivery Method: Circle System Utilized Preoxygenation: Pre-oxygenation with 100% oxygen Induction Type: IV induction Ventilation: Mask ventilation without difficulty LMA: LMA inserted LMA Size: 4.0 Number of attempts: 1 Airway Equipment and Method: Bite block Placement Confirmation: positive ETCO2 Tube secured with: Tape Dental Injury: Teeth and Oropharynx as per pre-operative assessment

## 2022-02-27 NOTE — Op Note (Signed)
OPERATIVE REPORT Patient name: Austin [name] MRN: [id number] DOB: [birth year]  DOS: 02/27/22  Preop Dx: Osteomyelitis right fifth metatarsal Postop Dx: same  Procedure:  1.  Partial fifth ray amputation RT foot  Surgeon: Felecia Shelling DPM  Anesthesia: 50-50 mixture of 2% lidocaine plain with 0.5% Marcaine plain totaling 15 mL infiltrated in the patient's right foot in a local block fashion  Hemostasis: No tourniquet utilized  EBL: Minimal mL Materials: None Injectables: None Pathology: Fifth metatarsal bone sent to pathology for path and culture  Condition: The patient tolerated the procedure and anesthesia well. No complications noted or reported   Justification for procedure: The patient is a 66 y.o. male DM2, HTN, PVD, A-fib on Eliquis, PSxHx BKA LLE who presents today for surgical correction of osteomyelitis to the right foot. All conservative modalities of been unsuccessful in providing any sort of satisfactory alleviation of symptoms with the patient. The patient was told benefits as well as possible side effects of the surgery. The patient consented for surgical correction. The patient consent form was reviewed. All patient questions were answered. No guarantees were expressed or implied. The patient and the surgeon both signed the patient consent form with the witness present and placed in the patient's chart.   Procedure in Detail: The patient was brought to the operating room, remained in the hospital bed in a supine position at which time an aseptic scrub and drape were performed about the patient's respective lower extremity after anesthesia was induced as described above. Attention was then directed to the surgical area where procedure number one commenced.  Procedure #1: Partial fifth ray amputation right foot A racquet type incision was planned and made around the fifth toe and fifth MTP of the right foot.  The racquet type incision also encompassed the plantar  wound to the fifth MTP joint.  The incision was sharply carried down to the level of bone and joint at which time the fifth toe was grasped with a perforating towel clamp and distracted distally and disarticulated at the fifth MTP.  The toe was removed in toto.  Any necrotic nonviable tissue within the amputation site was debrided away and excised using a #15 scalpel.  Additional dissection was utilized to expose the distal half of the fifth metatarsal bone of the right foot.  This was in preparation for the ensuing osteotomy.  Osteotomy was performed using a sagittal blade mounted on sagittal saw of the fifth metatarsal.  The distal portion of the metatarsal was removed in toto and placed in sterile specimen container and sent to pathology. Copious irrigation was utilized and any additional necrotic tissue was excised and debrided away from the amputation site.  Healthy margins.  Minimal bleeding noted.  Primary closure was obtained using combination of 2-0 nylon suture and stainless steel skin staples. Dry sterile compressive dressings were then applied to all previously mentioned incision sites about the patient's lower extremity.  The patient was then transferred from the operating room to the recovery room having tolerated the procedure and anesthesia well. All vital signs are stable. After a brief stay in the recovery room the patient was readmitted to inpatient room.  The patient is to keep the dressings clean dry and intact   IMPRESSION: Minimal bleeding noted.  Wound margins appeared healthy and viable.  There was not an extensive amount of necrotic debris within the amputation site.  Felecia Shelling, DPM [facility]  Dr. Felecia Shelling, DPM  [address], [place] [address]                Office [phone]  Fax [phone]

## 2022-02-27 NOTE — Progress Notes (Signed)
Physical Therapy Treatment Patient Details Name: Austin [name] MRN: [id number] DOB: 1956/01/28 Today's Date: 02/27/2022   History of Present Illness Pt is a 66 y.o. 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 Brain MRI negative for acute injury. S/p RLE arteriogram and angioplasty of external iliac artery on 5/17. S/p RLE tibial bypass graft on 5/22. Plan for R 5th ray amputation on 5/24. PMH includes PVD s/p L BKA (10/2018), DM, DVT, HTN, HLD.   PT Comments    Pt preparing for R foot 5th ray amputation this evening. Planned standing trial with LLE BKA prosthetic this session, but pt does not have prosthetic sleeve/sock (significant other called, will bring today). Pt unable to stand on RLE with RW despite maxA+2; tolerated prolonged sitting activity and demonstrates improving bed mobility. Pt remains limited by generalized weakness, decreased activity tolerance, and impaired balance strategies/postural reactions. Expect pt to have increased difficulty with OOB once s/p R foot 5th ray amputation; will likely require post-acute rehab services to maximize functional mobility and independence prior to return home. Pt with necessary family support and good tolerance for intensive AIR-level therapies; d/c recommendations updated, will continue to reassess post-op.    Recommendations for follow up therapy are one component of a multi-disciplinary discharge planning process, led by the attending physician.  Recommendations may be updated based on patient status, additional functional criteria and insurance authorization.  Follow Up Recommendations  Acute inpatient rehab (3hours/day)     Assistance Recommended at Discharge Intermittent Supervision/Assistance  Patient can return home with the following A lot of help with walking and/or transfers;A lot of help with bathing/dressing/bathroom;Assistance with cooking/housework;Help  with stairs or ramp for entrance;Assist for transportation   Equipment Recommendations  None recommended by PT    Recommendations for Other Services       Precautions / Restrictions Precautions Precautions: Fall;Other (comment) Precaution Comments: H/o L BKA (prosthetic in room; pt's girlfriend to bring prosthetic sleeve/sock 5/24) Restrictions Weight Bearing Restrictions: Yes Other Position/Activity Restrictions: WB for transfers only per podiatry     Mobility  Bed Mobility Overal bed mobility: Needs Assistance Bed Mobility: Supine to Sit, Sit to Supine, Rolling Rolling: Min assist   Supine to sit: Mod assist, HOB elevated     General bed mobility comments: improved ability to initiate BLE movement to EOB, heavy modA for HHA to elevate trunk to sitting; improved ability to scoot hips towards EOB without assist, min cues to scoot forward; modA for LE management with return to supine. rolling R/L upon return to supine due to bladder incontinence/need for linen change, heavy reliance on bed rail    Transfers Overall transfer level: Needs assistance Equipment used: Rolling walker (2 wheels) Transfers: Sit to/from Stand Sit to Stand: Max assist, +2 physical assistance, From elevated surface           General transfer comment: attempted standing trials from elevated bed height (L BKA prosthetic not donned as pt does not have sleeve/sock) to RW; pt able to clear buttocks but unable to extend hips/trunk despite maxA+2, significant lean towards L-side    Ambulation/Gait                   Stairs             Wheelchair Mobility    Modified Rankin (Stroke Patients Only)       Balance Overall balance assessment: Needs assistance Sitting-balance support: No upper extremity supported  Sitting balance-Leahy Scale: Fair Sitting balance - Comments: prolonged sitting EOB without UE support, R foot on floor     Standing balance-Leahy Scale: Zero Standing  balance comment: unable to achieve fully upright standing despite maxA+2                            Cognition Arousal/Alertness: Awake/alert Behavior During Therapy: WFL for tasks assessed/performed, Flat affect Overall Cognitive Status: No family/caregiver present to determine baseline cognitive functioning Area of Impairment: Attention, Following commands, Safety/judgement, Awareness, Problem solving                   Current Attention Level: Selective   Following Commands: Follows multi-step commands with increased time Safety/Judgement: Decreased awareness of safety, Decreased awareness of deficits Awareness: Emergent   General Comments: pt remains drowsy, more alert once sitting EOB, but requiring cues to keep eyes open when laying down; decreased awareness of deficits        Exercises      General Comments General comments (skin integrity, edema, etc.): pt has LLE prosthetic in room, but discovered he does not have sleeve/sock (significant other reports pt admitted with this; RN called ED to check); pt's significant other plans to bring extra to hospital today      Pertinent Vitals/Pain Pain Assessment Pain Assessment: Faces Faces Pain Scale: Hurts even more Pain Location: RLE incisions Pain Descriptors / Indicators: Grimacing, Guarding, Tightness Pain Intervention(s): Monitored during session, Limited activity within patient's tolerance    Home Living                          Prior Function            PT Goals (current goals can now be found in the care plan section) Progress towards PT goals: Progressing toward goals (slowly)    Frequency    Min 3X/week      PT Plan Discharge plan needs to be updated    Co-evaluation              AM-PAC PT "6 Clicks" Mobility   Outcome Measure  Help needed turning from your back to your side while in a flat bed without using bedrails?: A Little Help needed moving from lying on your  back to sitting on the side of a flat bed without using bedrails?: A Lot Help needed moving to and from a bed to a chair (including a wheelchair)?: A Lot Help needed standing up from a chair using your arms (e.g., wheelchair or bedside chair)?: Total Help needed to walk in hospital room?: Total Help needed climbing 3-5 steps with a railing? : Total 6 Click Score: 10    End of Session Equipment Utilized During Treatment: Gait belt Activity Tolerance: Patient tolerated treatment well;Patient limited by fatigue Patient left: in bed;with call bell/phone within reach;with bed alarm set Nurse Communication: Mobility status PT Visit Diagnosis: Other abnormalities of gait and mobility (R26.89);Pain     Time: [id number] PT Time Calculation (min) (ACUTE ONLY): 27 min  Charges:  $Therapeutic Activity: 23-37 mins                     Mabeline Caras, PT, DPT Acute Rehabilitation Services  Pager [phone] Office [place] 02/27/2022, 5:02 PM

## 2022-02-28 ENCOUNTER — Encounter (HOSPITAL_COMMUNITY): Payer: Self-pay | Admitting: Podiatry

## 2022-02-28 DIAGNOSIS — E11621 Type 2 diabetes mellitus with foot ulcer: Secondary | ICD-10-CM | POA: Diagnosis not present

## 2022-02-28 DIAGNOSIS — L97513 Non-pressure chronic ulcer of other part of right foot with necrosis of muscle: Secondary | ICD-10-CM | POA: Diagnosis not present

## 2022-02-28 DIAGNOSIS — M869 Osteomyelitis, unspecified: Secondary | ICD-10-CM | POA: Diagnosis not present

## 2022-02-28 DIAGNOSIS — E1169 Type 2 diabetes mellitus with other specified complication: Secondary | ICD-10-CM | POA: Diagnosis not present

## 2022-02-28 DIAGNOSIS — I482 Chronic atrial fibrillation, unspecified: Secondary | ICD-10-CM | POA: Diagnosis not present

## 2022-02-28 LAB — CBC
HCT: 32 % — ABNORMAL LOW (ref 39.0–52.0)
Hemoglobin: 10.6 g/dL — ABNORMAL LOW (ref 13.0–17.0)
MCH: 27.4 pg (ref 26.0–34.0)
MCHC: 33.1 g/dL (ref 30.0–36.0)
MCV: 82.7 fL (ref 80.0–100.0)
Platelets: 210 10*3/uL (ref 150–400)
RBC: 3.87 MIL/uL — ABNORMAL LOW (ref 4.22–5.81)
RDW: 15.4 % (ref 11.5–15.5)
WBC: 11.7 10*3/uL — ABNORMAL HIGH (ref 4.0–10.5)
nRBC: 0 % (ref 0.0–0.2)

## 2022-02-28 LAB — GLUCOSE, CAPILLARY
Glucose-Capillary: 322 mg/dL — ABNORMAL HIGH (ref 70–99)
Glucose-Capillary: 307 mg/dL — ABNORMAL HIGH (ref 70–99)
Glucose-Capillary: 289 mg/dL — ABNORMAL HIGH (ref 70–99)
Glucose-Capillary: 278 mg/dL — ABNORMAL HIGH (ref 70–99)

## 2022-02-28 LAB — COMPREHENSIVE METABOLIC PANEL
ALT: 21 U/L (ref 0–44)
AST: 17 U/L (ref 15–41)
Albumin: 2.3 g/dL — ABNORMAL LOW (ref 3.5–5.0)
Alkaline Phosphatase: 47 U/L (ref 38–126)
Anion gap: 8 (ref 5–15)
BUN: 12 mg/dL (ref 8–23)
CO2: 20 mmol/L — ABNORMAL LOW (ref 22–32)
Calcium: 8.7 mg/dL — ABNORMAL LOW (ref 8.9–10.3)
Chloride: 108 mmol/L (ref 98–111)
Creatinine, Ser: 0.75 mg/dL (ref 0.61–1.24)
GFR, Estimated: >60 mL/min (ref >60)
Glucose, Bld: 293 mg/dL — ABNORMAL HIGH (ref 70–99)
Potassium: 4.5 mmol/L (ref 3.5–5.1)
Sodium: 136 mmol/L (ref 135–145)
Total Bilirubin: 0.5 mg/dL (ref 0.3–1.2)
Total Protein: 6.6 g/dL (ref 6.5–8.1)

## 2022-02-28 LAB — MAGNESIUM: Magnesium: 1.8 mg/dL (ref 1.7–2.4)

## 2022-02-28 LAB — VANCOMYCIN, PEAK: Vancomycin Pk: 17 ug/mL — ABNORMAL LOW (ref 30–40)

## 2022-02-28 MED ORDER — APIXABAN 5 MG PO TABS
5.0000 mg | ORAL_TABLET | Freq: Two times a day (BID) | ORAL | Status: DC
  Administered 2022-02-28 – 2022-03-08 (×17): 5 mg via ORAL
  Filled 2022-02-28 (×17): qty 1

## 2022-02-28 NOTE — Care Management Important Message (Signed)
Important Message  Patient Details  Name: Austin [name] MRN: [id number] Date of Birth: 05/06/1956   Medicare Important Message Given:  Yes     Renie Ora 02/28/2022, 1:04 PM

## 2022-02-28 NOTE — Progress Notes (Signed)
Orthopedic Tech Progress Note Patient Details:  Austin [name] 06/12/1956 [id number] Left shoe at bedside Ortho Devices Type of Ortho Device: Darco shoe Ortho Device/Splint Location: RLE Ortho Device/Splint Interventions: Ordered      Boneta Standre A Sophya Vanblarcom 02/28/2022, 9:06 AM

## 2022-02-28 NOTE — Progress Notes (Signed)
Inpatient Rehab Admissions Coordinator:   I met with patient at the bedside to discuss CIR recommendations and goals/expectations of CIR stay.  We reviewed 3 hrs/day of therapy, physician follow up, and estimated length of stay ~2 weeks, depending on progress. I feel he could likely reach supervision>mod I w/c level with intense rehab.  He lives with a friend, and she is home 24/7.  We discussed need for [facility] prior auth and I explained process and that we could appeal if request was initially denied.  I do think he meets criteria for CIR, given his complicated hospital stay and functional decline.  I will start insurance request today and follow along.   Shann Medal, PT, DPT Admissions Coordinator [phone] 02/28/22  1:48 PM

## 2022-02-28 NOTE — Progress Notes (Signed)
[facility]  Date of Admission:  02/15/2022           Reason for visit: Follow up on DFU/OM   Current antibiotics: Vancomycin Ceftriaxone Metronidazole   ASSESSMENT:    66 y.o. male admitted with:  Diabetic foot ulcer with OM of the right 5th metatarsal: Status post OR with podiatry on 5/24 for partial 5th ray amputation. Cultures and pathology pending. Peripheral artery disease: Status post prior left BKA.  Underwent right femoral to TP trunk bypass with reversed GSV on 5/22 with vascular surgery to optimize blood flow prior to procedure for problem #1. Uncontrolled DM: Hgb A1c at admission is 11.3.   RECOMMENDATIONS:    Continue current antibiotics Follow cultures Wound care, lab monitoring, glycemic control Will follow   Principal Problem:   Diabetic foot ulcer associated with type 2 diabetes mellitus (HCC) Active Problems:   Essential hypertension   Type 2 diabetes mellitus with diabetic peripheral angiopathy without gangrene (HCC)   Status post below-knee amputation (HCC) - left side   Acute metabolic encephalopathy   Sepsis with acute organ dysfunction without septic shock (HCC)   Atrial fibrillation, chronic (HCC)   Osteomyelitis (HCC)    MEDICATIONS:    Scheduled Meds:  aspirin EC  81 mg Oral Daily   atorvastatin  80 mg Oral q1800   Chlorhexidine Gluconate Cloth  6 each Topical Daily   clopidogrel  75 mg Oral Q0600   insulin aspart  0-15 Units Subcutaneous TID WC   insulin aspart  0-5 Units Subcutaneous QHS   insulin aspart  5 Units Subcutaneous TID WC   insulin glargine-yfgn  22 Units Subcutaneous BID   losartan  25 mg Oral Daily   metroNIDAZOLE  500 mg Oral Q12H   pantoprazole  40 mg Oral Daily   polyethylene glycol  17 g Oral Daily   sodium chloride flush  3 mL Intravenous Once   Continuous Infusions:  sodium chloride     sodium chloride 75 mL/hr at 02/27/22 0808   cefTRIAXone (ROCEPHIN)  IV 2 g (02/27/22 0811)    vancomycin 1,250 mg (02/28/22 0610)   PRN Meds:.sodium chloride, acetaminophen **OR** acetaminophen, alum & mag hydroxide-simeth, guaiFENesin, hydrALAZINE, ipratropium-albuterol, labetalol, metoprolol tartrate, morphine injection, ondansetron **OR** ondansetron (ZOFRAN) IV, oxyCODONE, phenol, senna-docusate, sodium chloride, traZODone  SUBJECTIVE:   24 hour events:  S/p fifth ray amp Tmax 98.4 Cx from OR pending  He is feeling good.  No complaints, pain controlled, no fevers.   Review of Systems  All other systems reviewed and are negative.    OBJECTIVE:   Blood pressure 128/76, pulse 81, temperature (!) 97.4 F (36.3 C), temperature source Oral, resp. rate 15, height 6\' 4"  (1.93 m), weight 101.6 kg, SpO2 97 %. Body mass index is 27.26 kg/m.  Physical Exam Constitutional:      General: He is not in acute distress.    Appearance: Normal appearance.  Eyes:     Extraocular Movements: Extraocular movements intact.     Conjunctiva/sclera: Conjunctivae normal.  Abdominal:     General: There is no distension.     Palpations: Abdomen is soft.  Musculoskeletal:     Cervical back: Normal range of motion and neck supple.     Comments: Right leg incision looks good.  Right foot wrapped.   Skin:    General: Skin is warm and dry.  Neurological:     General: No focal deficit present.     Mental Status:  He is alert and oriented to person, place, and time.  Psychiatric:        Mood and Affect: Mood normal.        Behavior: Behavior normal.     Lab Results: Lab Results  Component Value Date   WBC 11.7 (H) 02/28/2022   HGB 10.6 (L) 02/28/2022   HCT 32.0 (L) 02/28/2022   MCV 82.7 02/28/2022   PLT 210 02/28/2022    Lab Results  Component Value Date   NA 136 02/28/2022   K 4.5 02/28/2022   CO2 20 (L) 02/28/2022   GLUCOSE 293 (H) 02/28/2022   BUN 12 02/28/2022   CREATININE 0.75 02/28/2022   CALCIUM 8.7 (L) 02/28/2022   GFRNONAA >60 02/28/2022   GFRAA >60 01/10/2020     Lab Results  Component Value Date   ALT 21 02/28/2022   AST 17 02/28/2022   ALKPHOS 47 02/28/2022   BILITOT 0.5 02/28/2022    No results found for: CRP     Component Value Date/Time   ESRSEDRATE 3 03/30/2021 0958   ESRSEDRATE 36 (H) 11/16/2013 0514     I have reviewed the micro and lab results in Epic.  Imaging: DG Foot Complete Right  Result Date: 02/27/2022 CLINICAL DATA:  Osteomyelitis left fifth metatarsal EXAM: RIGHT FOOT COMPLETE - 3+ VIEW COMPARISON:  02/15/2022 FINDINGS: There is interval surgical removal of right fifth toe. There is interval removal of right fifth metatarsal from the level of proximal shaft of fifth metatarsal. Small plantar spur is seen in calcaneus. Bony spurs are noted in the dorsal aspect of talonavicular joint. There is soft tissue swelling over the dorsum. IMPRESSION: Status post surgical removal of right fifth toe and partial resection of right fifth metatarsal. Electronically Signed   By: Ernie Avena M.D.   On: 02/27/2022 19:49   DG MINI C-ARM IMAGE ONLY  Result Date: 02/27/2022 There is no interpretation for this exam.  This order is for images obtained during a surgical procedure.  Please See "Surgeries" Tab for more information regarding the procedure.     Imaging independently reviewed in Epic.    Vedia Coffer for Infectious Disease [facility] [phone] pager 02/28/2022, 8:28 AM

## 2022-02-28 NOTE — Progress Notes (Signed)
Occupational Therapy Treatment Patient Details Name: Austin [name] MRN: [id number] DOB: 05/18/1956 Today's Date: 02/28/2022   History of present illness Pt is a 66 y.o. 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 Brain MRI negative for acute injury. S/p RLE arteriogram and angioplasty of external iliac artery on 5/17. S/p RLE tibial bypass graft on 5/22. Plan for R 5th ray amputation on 5/24. PMH includes PVD s/p L BKA (10/2018), DM, DVT, HTN, HLD.   OT comments  Pt was seen for OT ADL retraining session today with focus on bed moblity, functional mobility and transfers (3:1 and recliner) in preparation for increased independence with ADL's and self care tasks, grooming while seated and pt education. Pt was initially Mod A +2 for sit to stand and SPT to 3:1 from EOB that was elevated significantly. After resting, pt was Max A +2 from lower surface of 3:1 to recliner chair. Pt requires frequent/constant vc's/tc's and step by step instruction to maintain WB precautions (WB R LE only through heel during transfers per podiatry). Lift pad was placed behind pt and RN was educated to use maxisky PRN to assist pt back to bed. Pt is set-up for grooming while seated prior to lunch. He should benfit from AIR as he is motivated and has family/friends support for Mod I level.   Recommendations for follow up therapy are one component of a multi-disciplinary discharge planning process, led by the attending physician.  Recommendations may be updated based on patient status, additional functional criteria and insurance authorization.    Follow Up Recommendations  Acute inpatient rehab (3hours/day)    Assistance Recommended at Discharge Frequent or constant Supervision/Assistance  Patient can return home with the following  A lot of help with walking and/or transfers;A lot of help with bathing/dressing/bathroom   Equipment  Recommendations  Other (comment) (Defer to next venue)    Recommendations for Other Services      Precautions / Restrictions Precautions Precautions: Fall;Other (comment) Precaution Comments: H/o L BKA (prosthetic in room; pt's girlfriend brought in prosthetic sleeve/sock on 5/24) Restrictions Weight Bearing Restrictions: Yes RLE Weight Bearing:  (WB during transfers only, through heel) Other Position/Activity Restrictions: WB for transfers only per podiatry       Mobility Bed Mobility Overal bed mobility: Needs Assistance Bed Mobility: Supine to Sit     Supine to sit: Supervision, HOB elevated, Min guard    Transfers Overall transfer level: Needs assistance Equipment used: Rolling walker (2 wheels) Transfers: Sit to/from Stand, Bed to chair/wheelchair/BSC Sit to Stand: Mod assist, +2 physical assistance, +2 safety/equipment, From elevated surface Stand pivot transfers: Max assist, +2 physical assistance, +2 safety/equipment, From elevated surface   General transfer comment: Pt was initially Mod A +2 for sit to stand and SPT to 3:1 from EOB that was elevated significantly. After resting, pt was Max A +2 from lower surface of 3:1 to recliner chair. Pt requires frequent/constant vc's/tc's and step by step instruction to maintain WB precautions (WB R LE only through heel during transfers per podiatry). Lift pad was placed behind pt and RN was educated to use maxisky PRN to assist pt back to bed     Balance Overall balance assessment: Needs assistance Sitting-balance support: Single extremity supported, No upper extremity supported Sitting balance-Leahy Scale: Fair     Standing balance support: Bilateral upper extremity supported, Reliant on assistive device for balance Standing balance-Leahy Scale: Poor Standing balance comment: Difficulty/unable to  achieve fully upright standing despite mod-maxA+2 as pt appears to have difficulty with WB precautions through R LE.       ADL  either performed or assessed with clinical judgement   ADL Overall ADL's : Needs assistance/impaired     Grooming: Wash/dry hands;Wash/dry face;Sitting   Upper Body Bathing: Set up;Sitting       Lower Body Dressing: Maximal assistance;Sitting/lateral leans Lower Body Dressing Details (indicate cue type and reason): donning orthotic RLE with figure 4 bed level Toilet Transfer: +2 for physical assistance;+2 for safety/equipment;Moderate assistance;BSC/3in1;Rolling walker (2 wheels);Stand-pivot;Cueing for safety;Cueing for sequencing Toilet Transfer Details (indicate cue type and reason): Pt was Mod A +2 SPT from bed that was raised significantly secondary to WB through R LE during transfers/heel only orders per podiatry/chart review. Pt required VC/TC Toileting- Clothing Manipulation and Hygiene: Maximal assistance;+2 for physical assistance;+2 for safety/equipment;Cueing for safety;Cueing for sequencing;Sit to/from stand       Functional mobility during ADLs: Moderate assistance;Maximal assistance;+2 for physical assistance;+2 for safety/equipment;Cueing for safety;Cueing for sequencing;Rolling walker (2 wheels) (From elevated surface) General ADL Comments: Pt was seen for OT ADL retraining session today with focus on bed moblity, functional mobility and transfers (3:1 and recliner) in reparation for increased independence with ADL's and self care tasks, grooming while seated and pt education. Pt was initially Mod A +2 for sit to stand and SPT to 3:1 from EOB that was elevated significantly. After resting, pt was Max A +2 from lower surface of 3:1 to recliner chair. Pt requires frequent/constant vc's/tc's and step by step instruction to maintain WB precautions (WB R LE only through heel during transfers per podiatry). Lift pad was placed behind pt and RN was educated to use maxisky PRN to assist pt back to bed. Pt is set-up for grooming while seated prior to lunch. He should benfit from AIR as he  is motivated and has family/friends support for Mod I level.    Extremity/Trunk Assessment Upper Extremity Assessment Upper Extremity Assessment: Generalized weakness;Overall [facility] for tasks assessed   Lower Extremity Assessment Lower Extremity Assessment: Defer to PT evaluation        Vision Ability to See in Adequate Light: 0 Adequate Patient Visual Report: No change from baseline Vision Assessment?: No apparent visual deficits          Cognition Arousal/Alertness: Awake/alert Behavior During Therapy: WFL for tasks assessed/performed, Anxious Overall Cognitive Status: No family/caregiver present to determine baseline cognitive functioning Area of Impairment: Attention, Following commands, Safety/judgement, Awareness, Problem solving   Current Attention Level: Selective   Following Commands: Follows multi-step commands with increased time Safety/Judgement: Decreased awareness of safety, Decreased awareness of deficits Awareness: Emergent Problem Solving: Slow processing, Decreased initiation, Difficulty sequencing, Requires verbal cues, Requires tactile cues                General Comments  Pt is motivated for therapy and wants to return to Mod I level. Reports girlfriend and friends support    Pertinent Vitals/ Pain       Pain Assessment Pain Assessment: No/denies pain  Home Living Family/patient expects to be discharged to:: Private residence Living Arrangements: Non-relatives/Friends Available Help at Discharge: Family;Friend(s);Available 24 hours/day Type of Home: House Home Access: Level entry     Home Layout: One level     Bathroom Shower/Tub: Tub/shower unit (Takes sponge baths at sink)     Bathroom Accessibility: Yes How Accessible: Accessible via wheelchair;Accessible via walker Home Equipment: Rolling Walker (2 wheels);Wheelchair - Administrator, sports  Comments: lives with friend; will also have assist from brother and sister at d/c       Prior Functioning/Environment  Mod I, takes bird bathes at sink level prior to this admission per pt report/chart review.   Frequency  Min 3X/week        Progress Toward Goals  OT Goals(current goals can now be found in the care plan section)  Progress towards OT goals: Progressing toward goals  Acute Rehab OT Goals Patient Stated Goal: Go to Rehab, "I want to be independent" OT Goal Formulation: With patient Time For Goal Achievement: 03/07/22 Potential to Achieve Goals: Good  Plan Discharge plan needs to be updated;Frequency needs to be updated    Co-evaluation    PT/OT/SLP Co-Evaluation/Treatment: Yes Reason for Co-Treatment: Complexity of the patient's impairments (multi-system involvement);For patient/therapist safety;To address functional/ADL transfers PT goals addressed during session: Mobility/safety with mobility;Proper use of DME OT goals addressed during session: ADL's and self-care;Proper use of Adaptive equipment and DME (Functional mobility and transfers/activity tolerance related to ADL's)      AM-PAC OT "6 Clicks" Daily Activity     Outcome Measure   Help from another person eating meals?: None Help from another person taking care of personal grooming?: A Little Help from another person toileting, which includes using toliet, bedpan, or urinal?: A Lot Help from another person bathing (including washing, rinsing, drying)?: A Lot Help from another person to put on and taking off regular upper body clothing?: A Little Help from another person to put on and taking off regular lower body clothing?: A Lot 6 Click Score: 16    End of Session Equipment Utilized During Treatment: Gait belt;Rolling walker (2 wheels);Other (comment) (LLE prosthetic; R LE post-op shoe/orthotic)  OT Visit Diagnosis: Unsteadiness on feet (R26.81)   Activity Tolerance Patient tolerated treatment well   Patient Left in chair;with call bell/phone within reach;with nursing/sitter in  room;with chair alarm set   Nurse Communication Mobility status;Need for lift equipment        Time: 1126-1200 OT Time Calculation (min): 34 min  Charges: OT General Charges $OT Visit: 1 Visit OT Treatments $Self Care/Home Management : 8-22 mins   Almyra Deforest, OTR/L 02/28/2022, 12:46 PM

## 2022-02-28 NOTE — Progress Notes (Signed)
Subjective: 1 Day Post-Op Procedure(s) (LRB): RIGHT PARTIAL AMPUTATION FOOT (Right) Partial fifth ray amputation RT foot.  DOS: 02/27/2022.  Patient reports no pain.  Feeling well.  Resting comfortably in the recliner.  No strikethrough on the dressings.  Objective: Vital signs in last 24 hours: Temp:  [97.4 F (36.3 C)-97.9 F (36.6 C)] 97.6 F (36.4 C) (05/25 1604) Pulse Rate:  [77-101] 87 (05/25 1604) Resp:  [15-26] 17 (05/25 1604) BP: (99-128)/(59-86) 109/76 (05/25 1604) SpO2:  [94 %-98 %] 96 % (05/25 1604)  Recent Labs    02/26/22 0410 02/27/22 0320 02/28/22 0236  HGB 12.3* 11.5* 10.6*   Recent Labs    02/27/22 0320 02/28/22 0236  WBC 9.8 11.7*  RBC 4.15* 3.87*  HCT 34.5* 32.0*  PLT 190 210   Recent Labs    02/27/22 0320 02/28/22 0236  NA 136 136  K 3.7 4.5  CL 108 108  CO2 22 20*  BUN 7* 12  CREATININE 0.81 0.75  GLUCOSE 167* 293*  CALCIUM 8.5* 8.7*   No results for input(s): LABPT, INR in the last 72 hours.    Incision well coapted.  Staples and sutures intact.  No active bleeding or drainage.  Please see above noted photo  Assessment/Plan: 1 Day Post-Op Procedure(s) (LRB): RIGHT PARTIAL AMPUTATION FOOT (Right) DOS: 02/27/2022 -Dressings changed.  Leave clean dry and intact until follow-up outpatient -Cultures pending no growth < 12 hours.  Continue ABX as per ID.  -BKA LLE.  Patient may weight-bear minimally RLE for transition purposes only -From a podiatry/surgical standpoint patient okay to be discharged.  Planned for inpatient rehab ~2 weeks   Edrick Kins 02/28/2022, 5:31 PM

## 2022-02-28 NOTE — Progress Notes (Addendum)
Progress Note    02/28/2022 7:36 AM 1 Day Post-Op  Subjective:  no complaints  afebrile  Vitals:   02/27/22 2307 02/28/22 0352  BP: 114/82 100/86  Pulse: 85 78  Resp: 18 17  Temp: (!) 97.4 F (36.3 C) (!) 97.5 F (36.4 C)  SpO2: 97% 98%    Physical Exam: Cardiac:  regular Lungs:  non labored Incisions:  all incisions are healing nicely Extremities:  brisk right PT/peroneal doppler signals with bandage in place that is clean and dry   CBC    Component Value Date/Time   WBC 11.7 (H) 02/28/2022 0236   RBC 3.87 (L) 02/28/2022 0236   HGB 10.6 (L) 02/28/2022 0236   HGB 14.1 11/17/2013 0621   HCT 32.0 (L) 02/28/2022 0236   HCT 41.0 02/16/2022 0608   PLT 210 02/28/2022 0236   PLT 218 11/17/2013 0621   MCV 82.7 02/28/2022 0236   MCV 86 11/17/2013 0621   MCH 27.4 02/28/2022 0236   MCHC 33.1 02/28/2022 0236   RDW 15.4 02/28/2022 0236   RDW 13.6 11/17/2013 0621   LYMPHSABS 2.2 02/16/2022 0530   LYMPHSABS 2.4 11/17/2013 0621   MONOABS 1.3 (H) 02/16/2022 0530   MONOABS 0.9 11/17/2013 0621   EOSABS 0.0 02/16/2022 0530   EOSABS 0.1 11/17/2013 0621   BASOSABS 0.0 02/16/2022 0530   BASOSABS 0.0 11/17/2013 0621    BMET    Component Value Date/Time   NA 136 02/28/2022 0236   NA 135 (L) 11/17/2013 0621   K 4.5 02/28/2022 0236   K 4.3 11/17/2013 0621   CL 108 02/28/2022 0236   CL 99 11/17/2013 0621   CO2 20 (L) 02/28/2022 0236   CO2 31 11/17/2013 0621   GLUCOSE 293 (H) 02/28/2022 0236   GLUCOSE 172 (H) 11/17/2013 0621   BUN 12 02/28/2022 0236   BUN 10 11/17/2013 0621   CREATININE 0.75 02/28/2022 0236   CREATININE 0.85 11/17/2013 0621   CALCIUM 8.7 (L) 02/28/2022 0236   CALCIUM 9.6 11/17/2013 0621   GFRNONAA >60 02/28/2022 0236   GFRNONAA >60 11/17/2013 0621   GFRAA >60 01/10/2020 0808   GFRAA >60 11/17/2013 0621    INR    Component Value Date/Time   INR 1.3 (H) 02/15/2022 2051     Intake/Output Summary (Last 24 hours) at 02/28/2022 0736 Last data  filed at 02/28/2022 0700 Gross per 24 hour  Intake 590 ml  Output 1600 ml  Net -1010 ml     Assessment/Plan:  66 y.o. male is s/p:  right common femoral to TP trunk bypass with ipsilateral nonreversed great saphenous vein  3 Days Post-Op And   Partial fifth ray amputation RT foot by Dr. Amalia Hailey 1 Day Post-Op   -pt doing well this am with brisk doppler signals right PT/peroneal -toe amputation per Dr. Amalia Hailey.  Talked to pt about heel weight bearing only and will order his a darco shoe. -ok to restart anticoagulation for afib from vascular standpoint.   -mobilize out of bed    Leontine Locket, PA-C [facility] [phone] 02/28/2022 7:36 AM  I have seen and evaluated the patient. I agree with the PA note as documented above.  Status post right common femoral to TP trunk bypass with ipsilateral vein on Monday.  Palpable pulse in the bypass that is tunneled through the saphenectomy incision.  Incisions all look great.  Patient underwent partial right fifth ray amputation with podiatry yesterday.  Looks good from my standpoint.  Marty Heck,  MD [facility] [place] Office: [phone]

## 2022-02-28 NOTE — Progress Notes (Signed)
Physical Therapy Treatment Patient Details Name: Austin [name] MRN: [id number] DOB: 1956-09-26 Today's Date: 02/28/2022   History of Present Illness Pt is a 67 y.o. 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 Brain MRI negative for acute injury. S/p RLE arteriogram and angioplasty of external iliac artery on 5/17. S/p RLE tibial bypass graft on 5/22. Now s/p  Partial fifth ray amputation RT foot by Dr. Logan Bores 5/24. PMH includes PVD s/p L BKA (10/2018) with prosthetic, DM, DVT, HTN, HLD.    PT Comments    Pt received in supine, motivated to progress mobility/standing tolerance and stating "I want to be independent". Pt making good progress toward goals, able to initiate sit<>stand and stand pivot transfer training. Pt needing bed height maximally elevated for initial stand pivot to elevated BSC and with increased difficulty standing from BSC height. Pt unable to perform second pivot from [facility] to recliner, so chair pulled up behind him. Pt with difficulty maintaining heel WB on RLE during transfer despite max cues, darco shoe and +2 assist. Pt receptive to instruction on supine BLE exercises for strengthening, he would benefit from a handout to reinforce technique/frequency next session. Pt reports only minimal fatigue at end of session (1/10 modified RPE), although with noted muscle fatigue with prolonged standing trials. Pt continues to benefit from PT services to progress toward functional mobility goals, continue to recommend AIR.   Recommendations for follow up therapy are one component of a multi-disciplinary discharge planning process, led by the attending physician.  Recommendations may be updated based on patient status, additional functional criteria and insurance authorization.  Follow Up Recommendations  Acute inpatient rehab (3hours/day)     Assistance Recommended at Discharge Intermittent Supervision/Assistance   Patient can return home with the following A lot of help with bathing/dressing/bathroom;Assistance with cooking/housework;Help with stairs or ramp for entrance;Assist for transportation;Two people to help with walking and/or transfers   Equipment Recommendations  None recommended by PT (pending progression of mobility)    Recommendations for Other Services Rehab consult     Precautions / Restrictions Precautions Precautions: Fall;Other (comment) Precaution Comments: H/o L BKA (prosthetic in room; pt's girlfriend brought in prosthetic sleeve/sock on 5/24) Required Braces or Orthoses: Other Brace Other Brace: RLE darco shoe (in room) Restrictions Weight Bearing Restrictions: Yes RLE Weight Bearing: Partial weight bearing Other Position/Activity Restrictions: WB through R heel for transfers only per podiatry     Mobility  Bed Mobility Overal bed mobility: Needs Assistance Bed Mobility: Supine to Sit     Supine to sit: Min guard, HOB elevated     General bed mobility comments: Supervision to initiate, min guard for safety; supervision for advancing hips/anterior scooting    Transfers Overall transfer level: Needs assistance Equipment used: Rolling walker (2 wheels) Transfers: Sit to/from Stand, Bed to chair/wheelchair/BSC Sit to Stand: Mod assist, +2 physical assistance, +2 safety/equipment, From elevated surface Stand pivot transfers: Max assist, +2 physical assistance, +2 safety/equipment, From elevated surface         General transfer comment: Pt was initially Mod A +2 for sit to stand and SPT to 3:1 from EOB that was elevated significantly. After resting, pt was Max A +2 from lower surface of 3:1 with difficulty standing fully upright/extending B knees and ultimately needed  recliner chair pulled up behind him. Pt requires frequent/constant vc's/tc's and step by step instruction to maintain WB precautions (WB R LE only through heel during transfers  per podiatry) and pt  frequently placing weight through his toes, causing front of darco shoe to tip forward. Lift pad was placed behind pt and RN was educated to use maxisky PRN to assist pt back to bed vs have him perform seated scoot, pt unsafe to perform stand pivot back to bed with +2 assist from nursing staff. RN/unit secretary asked to get drop arm bedside commode for his room as this would be safer for him than pivoting to [facility]       Balance Overall balance assessment: Needs assistance Sitting-balance support: Single extremity supported, No upper extremity supported Sitting balance-Leahy Scale: Fair     Standing balance support: Bilateral upper extremity supported, Reliant on assistive device for balance Standing balance-Leahy Scale: Zero Standing balance comment: Difficulty/unable to achieve fully upright standing despite mod-maxA+2 as pt appears to have difficulty with WB precautions through R LE and LLE knee remaining somewhat flexed after initial transfer, likely due to fatigue. +2 maxA to maintain upright at [employer] Arousal/Alertness: Awake/alert Behavior During Therapy: [facility] for tasks assessed/performed, Anxious Overall Cognitive Status: No family/caregiver present to determine baseline cognitive functioning Area of Impairment: Attention, Following commands, Safety/judgement, Awareness, Problem solving                   Current Attention Level: Selective   Following Commands: Follows multi-step commands with increased time Safety/Judgement: Decreased awareness of safety, Decreased awareness of deficits Awareness: Emergent Problem Solving: Slow processing, Decreased initiation, Difficulty sequencing, Requires verbal cues, Requires tactile cues General Comments: poor insight into deficits and activity tolerance; pt unable to complete full pivot on second attempt (returning from [facility] to recliner after initial pivot from elevated bed>BSC) and states "I'm  not tired at all" although he had been unable to stand fully upright and pivot, and chair had to be pulled up behind him. Pt very eager to progress strength/independence with excellent participation as able. Decreased awareness of safety as well.        Exercises General Exercises - Lower Extremity Quad Sets: AROM, Both, 5 reps, Other (comment) (in recliner; encouraged him to perform hourly, weak quad set observed on RLE) Other Exercises Other Exercises: verbal review for ankle pumps on R and hip abd/heel slides TID, pt would benefit from handout to reinforce supine therex    General Comments General comments (skin integrity, edema, etc.): BP 113/79 supine prior to OOB and pt denies acute s/sx distress. HR to 134 bpm with exertion, no significant dyspnea with exertion.      Pertinent Vitals/Pain Pain Assessment Pain Assessment: Faces Faces Pain Scale: Hurts a little bit Pain Location: RLE Pain Descriptors / Indicators: Grimacing, Guarding Pain Intervention(s): Limited activity within patient's tolerance, Monitored during session, Repositioned    Home Living Family/patient expects to be discharged to:: Private residence Living Arrangements: Non-relatives/Friends Available Help at Discharge: Family;Friend(s);Available 24 hours/day Type of Home: House Home Access: Level entry       Home Layout: One level Home Equipment: Conservation officer, nature (2 wheels);Wheelchair - manual;Tub bench Additional Comments: lives with friend; will also have assist from brother and sister at d/c    Prior Function            PT Goals (current goals can now be found in the care plan section) Acute Rehab PT Goals Patient Stated Goal: To return home and be  independent in getting around and caring for myself. PT Goal Formulation: With patient Time For Goal Achievement: 03/07/22 Progress towards PT goals: Progressing toward goals    Frequency    Min 3X/week      PT Plan Current plan remains  appropriate    Co-evaluation PT/OT/SLP Co-Evaluation/Treatment: Yes Reason for Co-Treatment: For patient/therapist safety;Necessary to address cognition/behavior during functional activity;To address functional/ADL transfers PT goals addressed during session: Mobility/safety with mobility;Balance;Proper use of DME;Strengthening/ROM OT goals addressed during session: ADL's and self-care;Proper use of Adaptive equipment and DME (Functional mobility and transfers/activity tolerance related to ADL's)      AM-PAC PT "6 Clicks" Mobility   Outcome Measure  Help needed turning from your back to your side while in a flat bed without using bedrails?: A Little Help needed moving from lying on your back to sitting on the side of a flat bed without using bedrails?: A Little Help needed moving to and from a bed to a chair (including a wheelchair)?: A Lot Help needed standing up from a chair using your arms (e.g., wheelchair or bedside chair)?: Total Help needed to walk in hospital room?: Total Help needed climbing 3-5 steps with a railing? : Total 6 Click Score: 11    End of Session Equipment Utilized During Treatment: Gait belt Activity Tolerance: Patient tolerated treatment well Patient left: in chair;with call bell/phone within reach;with chair alarm set;with nursing/sitter in room Nurse Communication: Mobility status;Need for lift equipment;Other (comment) (lateral scoot tf with slide board vs maxi sky lift for return to bed) PT Visit Diagnosis: Other abnormalities of gait and mobility (R26.89);Pain     Time: 1126-1200 PT Time Calculation (min) (ACUTE ONLY): 34 min  Charges:  $Therapeutic Activity: 8-22 mins                     Myliyah Rebuck P., PTA Acute Rehabilitation Services Secure Chat Preferred 9a-5:30pm Office: [place] 02/28/2022, 2:44 PM

## 2022-02-28 NOTE — Progress Notes (Signed)
PROGRESS NOTE    Austin [name]  [id number] DOB: 01/18/1956 DOA: 02/15/2022 PCP: Theotis Burrow, MD   Brief Narrative:   Patient is a 66 year old male with past medical history of, hypertension, peripheral vascular disease, atrial fibrillation on Eliquis, hypertension, status post left BKA presented to hospital with weakness and altered mental status.,  Patient was noted to be in sepsis secondary to diabetic foot ulcer.  MRI of the brain was negative.  MRI of the foot was positive for fifth digit osteomyelitis.   Podiatry, infectious disease and vascular team were consulted. Vascular surgery did right iliac artery stent 02/20/2022, followed by right common femoral to TP trunk bypass with ipsilateral nonreversed great saphenous vein on 5/22.  S/P partial amputation of the fifth ray of the right foot 5/24 by podiatry.  CIR eval?  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   -Previous history of PCI and balloon angioplasty of right lower extremity, stent placement of left iliac.  Patient underwent right iliac artery stent 02/20/2022, right common femoral to TP trunk bypass with ipsilateral nonreversed great saphenous vein on 5/22.   -ID on board and currently on Rocephin, Flagyl and vancomycin- will continue for at least 24 hours post amputation -s/p partial amputation of the fifth ray of the right foot on  02/27/2022.  Diabetic foot ulcer associated with type 2 diabetes mellitus ([place]) Type 2 diabetes mellitus with diabetic peripheral angiopathy without gangrene (HCC) Continue Semglee -SSI  Acute metabolic encephalopathy Resolved - MRI brain with remote infarct.  Seen by neurology (5/14: MRI brain negative for acute stroke. No further inpatient neurologic workup indicated. Neurology to sign off) -TSH and ammonia within normal limits  Atrial fibrillation, chronic (HCC) On Eliquis as outpatient.   -resume eliquis   Left below-knee amputation (HCC) Chronic. Stable.  Essential hypertension Continue cozaar  DVT prophylaxis: Eliquis  Code Status: Full code    Status is: Inpatient  Remains inpatient appropriate because:  PT recs CIR  Subjective: Agreeable for rehab   Objective: Vitals:   02/27/22 1950 02/27/22 2307 02/28/22 0352 02/28/22 0755  BP: 106/62 114/82 100/86 128/76  Pulse: 83 85 78 81  Resp: 17 18 17 15   Temp: 97.8 F (36.6 C) (!) 97.4 F (36.3 C) (!) 97.5 F (36.4 C) (!) 97.4 F (36.3 C)  TempSrc: Oral Oral Oral Oral  SpO2: 94% 97% 98% 97%  Weight:      Height:        Intake/Output Summary (Last 24 hours) at 02/28/2022 1010 Last data filed at 02/28/2022 0755 Gross per 24 hour  Intake 590 ml  Output 1500 ml  Net -910 ml   Filed Weights   02/25/22 0438 02/26/22 0500 02/27/22 0410  Weight: 100.6 kg 101.5 kg 101.6 kg   Physical examination:  General: Appearance:     Overweight male in no acute distress     Lungs:      respirations unlabored  Heart:    Normal  heart rate.  MS:   Below knee amputation of left lower extremity is noted.  Right foot wrapped.   Neurologic:   Awake, alert, oriented x 3. No apparent focal neurological           defect.       Data Reviewed:   CBC: Recent Labs  Lab 02/22/22 0108 02/23/22 0116 02/26/22 0410 02/27/22 0320 02/28/22 0236  WBC 9.9 10.6* 9.5 9.8 11.7*  HGB 13.0 13.4 12.3* 11.5* 10.6*  HCT 38.8* 38.8*  36.9* 34.5* 32.0*  MCV 82.6 81.9 83.1 83.1 82.7  PLT 233 254 214 190 [id number]   Basic Metabolic Panel: Recent Labs  Lab 02/22/22 0108 02/23/22 0116 02/26/22 0410 02/27/22 0320 02/28/22 0236  NA 138 141 136 136 136  K 4.1 3.9 3.9 3.7 4.5  CL 107 110 107 108 108  CO2 24 24 23 22  20*  GLUCOSE 307* 153* 232* 167* 293*  BUN 12 9 8  7* 12  CREATININE 0.85 0.75 0.99 0.81 0.75  CALCIUM 8.9 9.1 8.5* 8.5* 8.7*  MG 1.8 1.7  --   --  1.8   GFR: Estimated Creatinine Clearance: 111.5 mL/min (by C-G formula based on SCr of 0.75 mg/dL). Liver Function Tests: Recent Labs  Lab 02/28/22 0236  AST 17  ALT 21  ALKPHOS 47  BILITOT 0.5  PROT 6.6  ALBUMIN 2.3*    No results for input(s): LIPASE, AMYLASE in the last 168 hours. No results for input(s): AMMONIA in the last 168 hours.  Coagulation Profile: No results for input(s): INR, PROTIME in the last 168 hours.  Cardiac Enzymes: No results for input(s): CKTOTAL, CKMB, CKMBINDEX, TROPONINI in the last 168 hours. BNP (last 3 results) No results for input(s): PROBNP in the last 8760 hours. HbA1C: No results for input(s): HGBA1C in the last 72 hours.  CBG: Recent Labs  Lab 02/27/22 1143 02/27/22 1602 02/27/22 1845 02/27/22 2124 02/28/22 0646  GLUCAP 192* 174* 157* 271* 322*   Lipid Profile: Recent Labs    02/26/22 0410  CHOL 52  HDL 15*  LDLCALC 24  TRIG 65  CHOLHDL 3.5    Thyroid Function Tests: No results for input(s): TSH, T4TOTAL, FREET4, T3FREE, THYROIDAB in the last 72 hours.  Anemia Panel: No results for input(s): VITAMINB12, FOLATE, FERRITIN, TIBC, IRON, RETICCTPCT in the last 72 hours.  Sepsis Labs: No results for input(s): PROCALCITON, LATICACIDVEN in the last 168 hours.   Recent Results (from the past 240 hour(s))  MRSA Next Gen by PCR, Nasal     Status: None   Collection Time: 02/19/22  8:50 AM   Specimen: Nasal Mucosa; Nasal Swab  Result Value Ref Range Status   MRSA by PCR Next Gen NOT DETECTED NOT  DETECTED Final    Comment: (NOTE) The GeneXpert MRSA Assay (FDA approved for NASAL specimens only), is one component of a comprehensive MRSA colonization surveillance program. It is not intended to diagnose MRSA infection nor to guide or monitor treatment for MRSA infections. Test performance is not FDA approved in patients less than 52 years old. Performed at [facility], [place] [address], [place], [place] [address]   Surgical PCR screen     Status: None   Collection Time: 02/20/22  4:12 AM   Specimen: Nasal Mucosa; Nasal Swab  Result Value Ref Range Status   MRSA, PCR NEGATIVE NEGATIVE Final   Staphylococcus aureus NEGATIVE NEGATIVE Final    Comment: (NOTE) The Xpert SA Assay (FDA approved for NASAL specimens in  patients 20 years of age and older), is one component of a comprehensive surveillance program. It is not intended to diagnose infection nor to guide or monitor treatment. Performed at [facility], [place] [address], [place], [place] [address]   Culture, blood (Routine X 2) w Reflex to ID Panel     Status: None (Preliminary result)   Collection Time: 02/26/22 12:22 PM   Specimen: BLOOD RIGHT HAND  Result Value Ref Range Status   Specimen Description BLOOD RIGHT HAND  Final   Special Requests   Final    AEROBIC BOTTLE ONLY Blood Culture results may not be optimal due to an inadequate volume of blood received in culture bottles   Culture   Final    NO GROWTH 1 DAY Performed at [facility], [place] [address], [place], [place] [address]    Report Status PENDING  Incomplete  Culture, blood (Routine X 2) w Reflex to ID Panel     Status: None (Preliminary result)   Collection Time: 02/26/22 12:25 PM   Specimen: BLOOD RIGHT HAND  Result Value Ref Range Status   Specimen Description BLOOD RIGHT HAND  Final   Special Requests   Final    AEROBIC BOTTLE ONLY Blood Culture results may not be optimal due to an inadequate volume of blood received in culture bottles    Culture   Final    NO GROWTH 1 DAY Performed at [facility], [place] [address], [place], [place] [address]    Report Status PENDING  Incomplete  Aerobic/Anaerobic Culture w Gram Stain (surgical/deep wound)     Status: None (Preliminary result)   Collection Time: 02/27/22  6:19 PM   Specimen: PATH Digit amputation; Tissue  Result Value Ref Range Status   Specimen Description BONE  Final   Special Requests RIGHT FIFTH TOE  Final   Gram Stain NO WBC SEEN NO ORGANISMS SEEN   Final   Culture   Final    NO GROWTH < 12 HOURS Performed at [facility], [place] [address] [place], [place] [address]    Report Status PENDING  Incomplete     Radiology Studies: DG Foot Complete Right  Result Date: 02/27/2022 CLINICAL DATA:  Osteomyelitis left fifth metatarsal EXAM: RIGHT FOOT COMPLETE - 3+ VIEW COMPARISON:  02/15/2022 FINDINGS: There is interval surgical removal of right fifth toe. There is interval removal of right fifth metatarsal from the level of proximal shaft of fifth metatarsal. Small plantar spur is seen in calcaneus. Bony spurs are noted in the dorsal aspect of talonavicular joint. There is soft tissue swelling over the dorsum. IMPRESSION: Status post surgical removal of right fifth toe and partial resection of right fifth metatarsal. Electronically Signed   By: Elmer Picker M.D.   On: 02/27/2022 19:49   DG MINI C-ARM IMAGE ONLY  Result Date: 02/27/2022 There is no interpretation for this exam.  This order is for images obtained during a surgical procedure.  Please See "Surgeries" Tab for more information regarding the procedure.     Scheduled Meds:  apixaban  5 mg Oral BID   aspirin EC  81 mg Oral Daily   atorvastatin  80 mg Oral q1800   Chlorhexidine Gluconate Cloth  6 each Topical Daily   clopidogrel  75 mg Oral Q0600   insulin aspart  0-15 Units Subcutaneous TID WC   insulin aspart  0-5 Units Subcutaneous QHS   insulin aspart  5 Units Subcutaneous TID WC  insulin glargine-yfgn  22 Units Subcutaneous BID   losartan  25 mg Oral Daily   metroNIDAZOLE  500 mg Oral Q12H   pantoprazole  40 mg Oral Daily   polyethylene glycol  17 g Oral Daily   sodium chloride flush  3 mL Intravenous Once   Continuous Infusions:  sodium chloride     sodium chloride 75 mL/hr at 02/27/22 0808   cefTRIAXone (ROCEPHIN)  IV 2 g (02/28/22 0835)   vancomycin 1,250 mg (02/28/22 0610)     LOS: 12 days     Geradine Girt, DO [facility] If 7PM-7AM, please contact night-coverage 02/28/2022, 10:10 AM

## 2022-03-01 DIAGNOSIS — I482 Chronic atrial fibrillation, unspecified: Secondary | ICD-10-CM | POA: Diagnosis not present

## 2022-03-01 DIAGNOSIS — L97513 Non-pressure chronic ulcer of other part of right foot with necrosis of muscle: Secondary | ICD-10-CM | POA: Diagnosis not present

## 2022-03-01 DIAGNOSIS — M869 Osteomyelitis, unspecified: Secondary | ICD-10-CM | POA: Diagnosis not present

## 2022-03-01 DIAGNOSIS — E1169 Type 2 diabetes mellitus with other specified complication: Secondary | ICD-10-CM | POA: Diagnosis not present

## 2022-03-01 DIAGNOSIS — E11621 Type 2 diabetes mellitus with foot ulcer: Secondary | ICD-10-CM | POA: Diagnosis not present

## 2022-03-01 LAB — GLUCOSE, CAPILLARY
Glucose-Capillary: 260 mg/dL — ABNORMAL HIGH (ref 70–99)
Glucose-Capillary: 276 mg/dL — ABNORMAL HIGH (ref 70–99)
Glucose-Capillary: 279 mg/dL — ABNORMAL HIGH (ref 70–99)
Glucose-Capillary: 267 mg/dL — ABNORMAL HIGH (ref 70–99)

## 2022-03-01 LAB — VANCOMYCIN, TROUGH: Vancomycin Tr: 15 ug/mL (ref 15–20)

## 2022-03-01 LAB — SURGICAL PATHOLOGY

## 2022-03-01 MED ORDER — VANCOMYCIN HCL 1500 MG/300ML IV SOLN
1500.0000 mg | Freq: Two times a day (BID) | INTRAVENOUS | Status: DC
  Administered 2022-03-01: 1500 mg via INTRAVENOUS
  Filled 2022-03-01: qty 300

## 2022-03-01 MED ORDER — INSULIN ASPART 100 UNIT/ML IJ SOLN
8.0000 [IU] | Freq: Three times a day (TID) | INTRAMUSCULAR | Status: DC
  Administered 2022-03-01 – 2022-03-02 (×4): 8 [IU] via SUBCUTANEOUS

## 2022-03-01 MED ORDER — INSULIN GLARGINE-YFGN 100 UNIT/ML ~~LOC~~ SOLN
26.0000 [IU] | Freq: Two times a day (BID) | SUBCUTANEOUS | Status: DC
  Administered 2022-03-01 – 2022-03-02 (×3): 26 [IU] via SUBCUTANEOUS
  Filled 2022-03-01 (×4): qty 0.26

## 2022-03-01 NOTE — PMR Pre-admission (Signed)
PMR Admission Coordinator Pre-Admission Assessment  Patient: STEPFON [name] is an 66 y.o., male MRN: [id number] DOB: May 02, 1956 Height: '6\' 4"'  (193 cm) Weight: 98.2 kg  Insurance Information HMO: yes    PPO:      PCP:      IPA:      80/20:      OTHER:  PRIMARY: U[employer] Medicare      Policy#: [id number]      Subscriber: pt CM Name: expedited appeals dept      Phone#: [phone]     Fax#: [phone] Pre-Cert#: [id number] auth for CIR via expedited appeal with [facility] Medicare with updates due to fax listed above on 6/7      Employer:  Benefits:  Phone #: [phone]     Name:  Eff. Date: 01/05/22     Deduct: $0      Out of Pocket Max: $8300 (met $17.36)      Life Max: n/a CIR: $1556/admit      SNF: 20 full days Outpatient: 80%     Co-Ins: 20% Home Health: 100%      Co-Pay:  DME: 80%     Co-Ins: 20% Providers:  SECONDARY: Medicaid      Policy#: [id number]     Phone#: [phone]  Financial Counselor:       Phone#:   The "Data Collection Information Summary" for patients in Inpatient Rehabilitation Facilities with attached "Privacy Act [place] Records" was provided and verbally reviewed with: Patient  Emergency Contact Information Contact Information     Name Relation Home Work Mobile   Austin [name] [phone]  [phone]   [name], Austin [phone]  [phone]   Austin [name]   [phone]       Current Medical History  Patient Admitting Diagnosis: debility   History of Present Illness: Pt is a 66 y/o with PMH of PVD s/p 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  MRI negative.  He underwent failed RLE arteriorgram and angioplasty of external iliac artery on 5/17 and tibial bypass on 5/22, as well as 5th ray amputation on 5/24.  Post op course ID consult with recs for rocephin, flagyl, and vancomycin.  Therapy ongoing and pt was recommended for CIR.     Complete NIHSS TOTAL: 0  [facility]'s [facility] record [facility] has been reviewed by the rehabilitation admission coordinator and physician.  Past Medical History  Past Medical History:  Diagnosis Date   Coronary artery disease    Diabetes (Austin [name])    DVT (deep venous thrombosis) (Austin [name])    Gangrene of left foot (Austin [name]) 02/09/2018   GERD (gastroesophageal reflux disease)    Hyperlipidemia    Hypertension    Peripheral vascular disease (Austin [name])     Has the patient had major surgery during 100 days prior to admission? Yes  Family History   family history includes Coronary artery disease in his father; Diabetes in his mother; Hypertension in his [name]; Leukemia in his brother.  Current Medications  Current Facility-Administered Medications:    0.9 %  sodium chloride infusion, 500 mL, Intravenous, Once PRN, Rhyne, Samantha J, PA-C   acetaminophen (TYLENOL) tablet 325-650 mg, 325-650 mg, Oral, Q4H PRN, 650 mg at 02/26/22 0551 **OR** acetaminophen (TYLENOL) suppository 325-650 mg, 325-650 mg, Rectal, Q4H PRN, Rhyne, Samantha J, PA-C   alum & mag hydroxide-simeth (MAALOX/MYLANTA) 200-200-20 MG/5ML suspension 15-30 mL, 15-30 mL, Oral, Q2H PRN, Rhyne, Aldona Bar  J, PA-C   amoxicillin-clavulanate (AUGMENTIN) 875-125 MG per tablet 1 tablet, 1 tablet, Oral, Q12H, Samtani, Jai-Gurmukh, MD, 1 tablet at 03/08/22 0403   apixaban (ELIQUIS) tablet 5 mg, 5 mg, Oral, BID, Vann, Jessica U, DO, 5 mg at 03/08/22 7829   aspirin EC tablet 81 mg, 81 mg, Oral, Daily, Rhyne, Samantha J, PA-C, 81 mg at 03/08/22 0949   atorvastatin (LIPITOR) tablet 80 mg, 80 mg, Oral, q1800, Rhyne, Samantha J, PA-C, 80 mg at 03/07/22 1713   clopidogrel (PLAVIX) tablet 75 mg, 75 mg, Oral, Q0600, Rhyne, Samantha J, PA-C, 75 mg at 03/08/22 5621   guaiFENesin (ROBITUSSIN) 100 MG/5ML liquid 5 mL, 5 mL, Oral, Q4H PRN, Rhyne, Samantha J, PA-C   hydrALAZINE (APRESOLINE) injection 10 mg, 10 mg, Intravenous, Q4H PRN, Rhyne, Samantha J, PA-C    insulin aspart (novoLOG) injection 0-15 Units, 0-15 Units, Subcutaneous, TID WC, Rhyne, Samantha J, PA-C, 2 Units at 03/08/22 3086   insulin aspart (novoLOG) injection 0-5 Units, 0-5 Units, Subcutaneous, QHS, Rhyne, Samantha J, PA-C, 2 Units at 03/06/22 2209   insulin aspart (novoLOG) injection 10 Units, 10 Units, Subcutaneous, TID WC, Dwyane Dee, MD, 10 Units at 03/07/22 1714   insulin glargine-yfgn (SEMGLEE) injection 30 Units, 30 Units, Subcutaneous, BID, Dwyane Dee, MD, 30 Units at 03/08/22 0954   ipratropium-albuterol (DUONEB) 0.5-2.5 (3) MG/3ML nebulizer solution 3 mL, 3 mL, Nebulization, Q4H PRN, Rhyne, Samantha J, PA-C   labetalol (NORMODYNE) injection 10 mg, 10 mg, Intravenous, Q10 min PRN, Rhyne, Samantha J, PA-C, 10 mg at 02/26/22 0424   losartan (COZAAR) tablet 25 mg, 25 mg, Oral, Daily, Rhyne, Samantha J, PA-C, 25 mg at 03/08/22 0949   metoprolol tartrate (LOPRESSOR) injection 2-5 mg, 2-5 mg, Intravenous, Q2H PRN, Rhyne, Samantha J, PA-C   morphine (PF) 2 MG/ML injection 2 mg, 2 mg, Intravenous, Q2H PRN, Rhyne, Samantha J, PA-C, 2 mg at 02/26/22 0420   ondansetron (ZOFRAN) tablet 4 mg, 4 mg, Oral, Q6H PRN **OR** ondansetron (ZOFRAN) injection 4 mg, 4 mg, Intravenous, Q6H PRN, Rhyne, Samantha J, PA-C   oxyCODONE (Oxy IR/ROXICODONE) immediate release tablet 5 mg, 5 mg, Oral, Q4H PRN, Rhyne, Samantha J, PA-C, 5 mg at 03/05/22 1650   pantoprazole (PROTONIX) EC tablet 40 mg, 40 mg, Oral, Daily, Rhyne, Samantha J, PA-C, 40 mg at 03/08/22 0949   phenol (CHLORASEPTIC) mouth spray 1 spray, 1 spray, Mouth/Throat, PRN, Rhyne, Samantha J, PA-C   polyethylene glycol (MIRALAX / GLYCOLAX) packet 17 g, 17 g, Oral, Daily, Rhyne, Samantha J, PA-C, 17 g at 03/08/22 0954   senna-docusate (Senokot-S) tablet 1 tablet, 1 tablet, Oral, QHS PRN, Rhyne, Samantha J, PA-C, 1 tablet at 02/25/22 2138   sodium chloride (OCEAN) 0.65 % nasal spray 1 spray, 1 spray, Each Nare, PRN, Elgergawy, Silver Huguenin, MD, 1 spray  at 02/26/22 2249   sodium chloride flush (NS) 0.9 % injection 3 mL, 3 mL, Intravenous, Once, Rhyne, Samantha J, PA-C   traZODone (DESYREL) tablet 50 mg, 50 mg, Oral, QHS PRN, Rhyne, Samantha J, PA-C, 50 mg at 02/23/22 2214  Patients Current Diet:  Diet Order             Diet - low sodium heart healthy           Diet Carb Modified Fluid consistency: Thin; Room service appropriate? Yes with Assist  Diet effective now                   Precautions / Restrictions Precautions Precautions: Fall, Other (comment)  Precaution Comments: H/o L BKA (prosthetic in room) Other Brace: RLE darco shoe (in room) Restrictions Weight Bearing Restrictions: Yes RLE Weight Bearing: Partial weight bearing RLE Partial Weight Bearing Percentage or Pounds:  (Heels only) Other Position/Activity Restrictions: WB through R heel for transfers only per podiatry   Has the patient had 2 or more falls or a fall with injury in the past year? Yes  Prior Activity Level Household: mod I prior to admit, prosthetic and RW, not driving  Prior Functional Level Self Care: Did the patient need help bathing, dressing, using the toilet or eating? Independent  Indoor Mobility: Did the patient need assistance with walking from room to room (with or without device)? Independent  Stairs: Did the patient need assistance with internal or external stairs (with or without device)? Independent  Functional Cognition: Did the patient need help planning regular tasks such as shopping or remembering to take medications? Independent  Patient Information Are you of Hispanic, Latino/a,or Spanish origin?: A. No, not of Hispanic, Latino/a, or Spanish origin What is your race?: B. Black or African American Do you need or want an interpreter to communicate with a doctor or health care staff?: 0. No  Patient's Response To:  Health Literacy and Transportation Is the patient able to respond to health literacy and transportation needs?:  Yes Health Literacy - How often do you need to have someone help you when you read instructions, pamphlets, or other written material from your doctor or pharmacy?: Always In the past 12 months, has lack of transportation kept you from medical appointments or from getting medications?: Yes In the past 12 months, has lack of transportation kept you from meetings, work, or from getting things needed for daily living?: Yes  [name] / [place] Devices/Equipment: Environmental consultant (specify type), Prosthesis Home Equipment: Rolling Walker (2 wheels), Wheelchair - manual, Tub bench  Prior Device Use: Indicate devices/aids used by the patient prior to current illness, exacerbation or injury? Walker and Orthotics/Prosthetics  Current Functional Level Cognition  Overall Cognitive Status: No family/caregiver present to determine baseline cognitive functioning Current Attention Level: Selective Orientation Level: Oriented X4 Following Commands: Follows multi-step commands with increased time Safety/Judgement: Decreased awareness of safety, Decreased awareness of deficits General Comments: difficulty maintaining WB precautions despite instructions, pt in wet bed upon therapist arrival to room (purewick on but had leaked?) but also sitting on bed pan with nothing in it.    Extremity Assessment (includes Sensation/Coordination)  Upper Extremity Assessment: Generalized weakness, Overall WFL for tasks assessed  Lower Extremity Assessment: Defer to PT evaluation RLE Deficits / Details: s/p RLE revascularization; hip and knee functionally at least 3/5 strength RLE Sensation: history of peripheral neuropathy LLE Deficits / Details: h/o L BKA; hip and knee functionally at least 3/5 strength LLE Sensation: history of peripheral neuropathy    ADLs  Overall ADL's : Needs assistance/impaired Eating/Feeding: Modified independent, Bed level Grooming: Wash/dry hands, Wash/dry face,  Supervision/safety, Sitting Grooming Details (indicate cue type and reason): in recliner Upper Body Bathing: Supervision/ safety, Sitting Upper Body Bathing Details (indicate cue type and reason): in recliner Lower Body Bathing: Moderate assistance, Sit to/from stand Lower Body Bathing Details (indicate cue type and reason): able to bathe legs seated and required max assist for peri area cleaning while standing in Stedy Upper Body Dressing : Minimal assistance, Sitting Upper Body Dressing Details (indicate cue type and reason): in recliner Lower Body Dressing: Moderate assistance, Sitting/lateral leans Lower Body Dressing Details (indicate cue type and reason):  able to don left prosthesis and required assistance with orthotic on RLE Toilet Transfer:  (Pt declined as he is currently using bed pan. Discussed importance of functional transfers to assist in increasing activity tolerance/endurance. Pt verbalized understanding/reports frustration w/ difficulty having BM of late & wanting to stay on bedpan.) Toilet Transfer Details (indicate cue type and reason): Pt was Mod A +2 SPT from bed that was raised significantly secondary to WB through R LE during transfers/heel only orders per podiatry/chart review. Pt required VC/TC Toileting- Clothing Manipulation and Hygiene: Maximal assistance, +2 for physical assistance, +2 for safety/equipment, Cueing for safety, Cueing for sequencing, Sit to/from stand Functional mobility during ADLs: Moderate assistance, Maximal assistance, +2 for physical assistance, +2 for safety/equipment, Cueing for safety, Cueing for sequencing, Rolling walker (2 wheels) (From elevated surface) General ADL Comments: self care performed seated in recliner with patient standing in stedy for peri area cleaning    Mobility  Overal bed mobility: Needs Assistance Bed Mobility: Supine to Sit Rolling: Min guard Supine to sit: Min assist, HOB elevated General bed mobility comments:  required assistance raising trunk, pt reliant on bed rail and HOB very elevated while performing    Transfers  Overall transfer level: Needs assistance Equipment used: Rolling walker (2 wheels), Ambulation equipment used Transfers: Sit to/from Stand, Bed to chair/wheelchair/BSC Sit to Stand: Mod assist, +2 safety/equipment, +2 physical assistance, From elevated surface Bed to/from chair/wheelchair/BSC transfer type:: Via Lift equipment Stand pivot transfers: Max assist, +2 physical assistance, +2 safety/equipment, From elevated surface  Lateral/Scoot Transfers: Mod assist Transfer via Lift Equipment: Stedy General transfer comment: stood from EOB to [place] with +2 modA from elevated bed height, mod cues for safe UE placement and use of momentum required; totalA to transfer to chair from bed with [facility] for safety.    Ambulation / Gait / Stairs / Office manager / Balance Dynamic Sitting Balance Sitting balance - Comments: able to don L BKA prosthetic with assist for set-up; requires assist to don R darco shoe Balance Overall balance assessment: Needs assistance Sitting-balance support: Single extremity supported, No upper extremity supported Sitting balance-Leahy Scale: Fair Sitting balance - Comments: able to don L BKA prosthetic with assist for set-up; requires assist to don R darco shoe Standing balance support: Bilateral upper extremity supported, Reliant on assistive device for balance Standing balance-Leahy Scale: Poor Standing balance comment: Pt able to stand ~3 mins in Stedy frame for posterior/peri-care due to wet bed, fair compliance with RLE heel WB in [place];    Special needs/care consideration Continuous Drip IV  vancomycin, rocephin, Skin RLE incisions, and Diabetic management yes   Previous Home Environment (from acute therapy documentation) Living Arrangements: Non-relatives/Friends Available Help at Discharge: Family, Friend(s), Available 24  hours/day Type of Home: House Home Layout: One level Home Access: Level entry Bathroom Shower/Tub: Tub/shower unit (takes sponge baths at sink) Bathroom Toilet: Handicapped height Bathroom Accessibility: Yes How Accessible: Accessible via wheelchair, Accessible via walker [place]: No Additional Comments: lives with friend; will also have assist from brother and [name] at d/c  Discharge Living Setting Plans for Discharge Living Setting: Patient's home, Lives with (comment) (s/o Clarise Cruz) Type of Home at Discharge: House Discharge Home Layout: One level Discharge Home Access: Level entry Discharge Bathroom Shower/Tub: [place] unit Discharge Bathroom Toilet: Handicapped height Discharge Bathroom Accessibility: Yes How Accessible: Accessible via wheelchair Does the patient have any problems obtaining your medications?: No  Social/Family/Support Systems Anticipated Caregiver: significant other Clarise Cruz,  [name] Deloris, and brother Anticipated Ambulance person Information: Deloris [phone] Ability/Limitations of Caregiver: min assist mobility Caregiver Availability: 24/7 Discharge Plan Discussed with Primary Caregiver: Yes Is Caregiver In Agreement with Plan?: Yes Does Caregiver/Family have Issues with Lodging/Transportation while Pt is in Rehab?: Yes (no car)  Goals Patient/Family Goal for Rehab: PT/OT min assist w/c level, SL n/a Expected length of stay: 14-16 days Pt/Family Agrees to Admission and willing to participate: Yes Program Orientation Provided & Reviewed with Pt/Caregiver Including Roles  & Responsibilities: Yes  Barriers to Discharge: Insurance for SNF coverage  Decrease burden of Care through IP rehab admission: n/a  Possible need for SNF placement upon discharge: Not anticipated  Patient Condition: I have reviewed medical records from [facility], spoken with CM, and patient. I met with patient at the bedside for inpatient rehabilitation assessment.   Patient will benefit from ongoing PT and OT, can actively participate in 3 hours of therapy a day 5 days of the week, and can make measurable gains during the admission.  Patient will also benefit from the coordinated team approach during an Inpatient Acute Rehabilitation admission.  The patient will receive intensive therapy as well as Rehabilitation physician, nursing, social worker, and care management interventions.  Due to safety, skin/wound care, disease management, medication administration, pain management, and patient education the patient requires 24 hour a day rehabilitation nursing.  The patient is currently mod/max assist with mobility and basic ADLs.  Discharge setting and therapy post discharge at home with home health is anticipated.  Patient has agreed to participate in the Acute Inpatient Rehabilitation Program and will admit today.  Preadmission Screen Completed By: Shann Medal, PT, DPT and  Michel Santee, 03/08/2022 10:00 AM ______________________________________________________________________   Discussed status with Dr. Ranell Patrick on 03/08/22  at 10:00 AM  and received approval for admission today.  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 Yes Co-Morbidities requiring supervision/potential complications: type 2 diabetes, diabetic foot ulcer, left BKA, essential hypertension, peripheral vascular disease Due to bladder management, bowel management, safety, skin/wound care, disease management, medication administration, pain management, and patient education, does the patient require 24 hr/day rehab nursing? Yes Does the patient require coordinated care of a physician, rehab nurse, PT, OT to address physical  and functional deficits in the context of the above medical diagnosis(es)? Yes Addressing deficits in the following areas: balance, endurance, locomotion, strength, transferring, bowel/bladder control, bathing, dressing, feeding, grooming, toileting, and psychosocial support Can the patient actively participate in an intensive therapy program of at least 3 hrs of therapy 5 days a week? Yes The potential for patient to make measurable gains while on inpatient rehab is excellent Anticipated functional outcomes upon discharge from inpatient rehab: min assist PT, supervision OT, supervision SLP Estimated rehab length of stay to reach the above functional goals is: 10-14 days Anticipated discharge destination: Home 10. Overall Rehab/Functional Prognosis: excellent   MD Signature: Leeroy Cha, MD

## 2022-03-01 NOTE — Progress Notes (Addendum)
[facility]  Date of Admission:  02/15/2022           Reason for visit: Follow up on DFU/OM   Current antibiotics: Vancomycin Ceftriaxone Metronidazole   ASSESSMENT:    66 y.o. male admitted with:  Diabetic foot ulcer with OM of the right 5th metatarsal: Status post OR with podiatry on 5/24 for partial 5th ray amputation. Pathology showed osteomyelitis. Cultures pending. Peripheral artery disease: Status post prior left BKA.  Underwent right femoral to TP trunk bypass with reversed GSV on 5/22 with vascular surgery to optimize blood flow prior to procedure for problem #1. Uncontrolled DM: Hgb A1c at admission is 11.3.  RECOMMENDATIONS:    Continue ceftriaxone and flagyl Stop vancomycin Wound care, glycemic control Planning for a few weeks of mop up therapy as difficult to discern if all osteomyelitic bone removed at time of partial 5th ray amputation.  Anticipate transitioning to PO soon to continue antibiotics.  Hopefully he is able to go to rehab here Dr Daiva Eves available as needed over the weekend.  I'll be back Tuesday to see him whether he is on the floor or at CIR   Principal Problem:   Diabetic foot ulcer associated with type 2 diabetes mellitus (HCC) Active Problems:   Essential hypertension   Type 2 diabetes mellitus with diabetic peripheral angiopathy without gangrene (HCC)   Status post below-knee amputation (HCC) - left side   Acute metabolic encephalopathy   Sepsis with acute organ dysfunction without septic shock (HCC)   Atrial fibrillation, chronic (HCC)   Osteomyelitis (HCC)    MEDICATIONS:    Scheduled Meds:  apixaban  5 mg Oral BID   aspirin EC  81 mg Oral Daily   atorvastatin  80 mg Oral q1800   Chlorhexidine Gluconate Cloth  6 each Topical Daily   clopidogrel  75 mg Oral Q0600   insulin aspart  0-15 Units Subcutaneous TID WC   insulin aspart  0-5 Units Subcutaneous QHS   insulin aspart  8 Units Subcutaneous TID  WC   insulin glargine-yfgn  26 Units Subcutaneous BID   losartan  25 mg Oral Daily   metroNIDAZOLE  500 mg Oral Q12H   pantoprazole  40 mg Oral Daily   polyethylene glycol  17 g Oral Daily   sodium chloride flush  3 mL Intravenous Once   Continuous Infusions:  sodium chloride     sodium chloride Stopped (02/28/22 2204)   cefTRIAXone (ROCEPHIN)  IV 2 g (02/28/22 0835)   vancomycin 1,500 mg (03/01/22 0647)   PRN Meds:.sodium chloride, acetaminophen **OR** acetaminophen, alum & mag hydroxide-simeth, guaiFENesin, hydrALAZINE, ipratropium-albuterol, labetalol, metoprolol tartrate, morphine injection, ondansetron **OR** ondansetron (ZOFRAN) IV, oxyCODONE, phenol, senna-docusate, sodium chloride, traZODone  SUBJECTIVE:   24 hour events:  No acute events noted  Therapy recommending CIR Continued on vanc/ctx/flagyl 5/24 cx currently NGTD  Feeling good.  No fevers, pain, or diarrhea.   Review of Systems  All other systems reviewed and are negative.    OBJECTIVE:   Blood pressure 116/63, pulse 77, temperature (!) 97.5 F (36.4 C), temperature source Oral, resp. rate 15, height 6\' 4"  (1.93 m), weight 101.6 kg, SpO2 96 %. Body mass index is 27.26 kg/m.  Physical Exam Constitutional:      General: He is not in acute distress.    Appearance: Normal appearance.  HENT:     Head: Normocephalic and atraumatic.  Pulmonary:     Effort: Pulmonary effort is  normal. No respiratory distress.  Abdominal:     General: There is no distension.     Palpations: Abdomen is soft.  Musculoskeletal:     Comments: Right leg incision looks good.  Right foot wrapped at amputation site.   Skin:    General: Skin is warm and dry.  Neurological:     General: No focal deficit present.     Mental Status: He is alert and oriented to person, place, and time.  Psychiatric:        Mood and Affect: Mood normal.        Behavior: Behavior normal.     Lab Results: Lab Results  Component Value Date   WBC  11.7 (H) 02/28/2022   HGB 10.6 (L) 02/28/2022   HCT 32.0 (L) 02/28/2022   MCV 82.7 02/28/2022   PLT 210 02/28/2022    Lab Results  Component Value Date   NA 136 02/28/2022   K 4.5 02/28/2022   CO2 20 (L) 02/28/2022   GLUCOSE 293 (H) 02/28/2022   BUN 12 02/28/2022   CREATININE 0.75 02/28/2022   CALCIUM 8.7 (L) 02/28/2022   GFRNONAA >60 02/28/2022   GFRAA >60 01/10/2020    Lab Results  Component Value Date   ALT 21 02/28/2022   AST 17 02/28/2022   ALKPHOS 47 02/28/2022   BILITOT 0.5 02/28/2022    No results found for: CRP     Component Value Date/Time   ESRSEDRATE 3 03/30/2021 0958   ESRSEDRATE 36 (H) 11/16/2013 0514     I have reviewed the micro and lab results in Epic.  Imaging: DG Foot Complete Right  Result Date: 02/27/2022 CLINICAL DATA:  Osteomyelitis left fifth metatarsal EXAM: RIGHT FOOT COMPLETE - 3+ VIEW COMPARISON:  02/15/2022 FINDINGS: There is interval surgical removal of right fifth toe. There is interval removal of right fifth metatarsal from the level of proximal shaft of fifth metatarsal. Small plantar spur is seen in calcaneus. Bony spurs are noted in the dorsal aspect of talonavicular joint. There is soft tissue swelling over the dorsum. IMPRESSION: Status post surgical removal of right fifth toe and partial resection of right fifth metatarsal. Electronically Signed   By: Ernie Avena M.D.   On: 02/27/2022 19:49   DG MINI C-ARM IMAGE ONLY  Result Date: 02/27/2022 There is no interpretation for this exam.  This order is for images obtained during a surgical procedure.  Please See "Surgeries" Tab for more information regarding the procedure.     Imaging independently reviewed in Epic.    Vedia Coffer for Infectious Disease [facility] [phone] pager 03/01/2022, 8:25 AM

## 2022-03-01 NOTE — Progress Notes (Signed)
PROGRESS NOTE    Austin [name]  [id number] DOB: 1955/11/01 DOA: 02/15/2022 PCP: Theotis Burrow, MD   Brief Narrative:   Patient is a 66 year old male with past medical history of, hypertension, peripheral vascular disease, atrial fibrillation on Eliquis, hypertension, status post left BKA presented to hospital with weakness and altered mental status.,  Patient was noted to be in sepsis secondary to diabetic foot ulcer.  MRI of the brain was negative.  MRI of the foot was positive for fifth digit osteomyelitis.   Podiatry, infectious disease and vascular team were consulted. Vascular surgery did right iliac artery stent 02/20/2022, followed by right common femoral to TP trunk bypass with ipsilateral nonreversed great saphenous vein on 5/22.  S/P partial amputation of the fifth ray of the right foot 5/24 by podiatry.  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   -Previous history of PCI and balloon angioplasty of right lower extremity, stent placement of left iliac.  Patient underwent right iliac artery stent 02/20/2022, right common femoral to TP trunk bypass with ipsilateral nonreversed great saphenous vein on 5/22.   -ID on board and currently on Rocephin, Flagyl and vancomycin- per ID -s/p partial amputation of the fifth ray of the right foot on 02/27/2022.  Diabetic foot  ulcer associated with type 2 diabetes mellitus ([place]) Type 2 diabetes mellitus with diabetic peripheral angiopathy without gangrene (HCC) Continue Semglee -SSI -adjust medications for better control  Acute metabolic encephalopathy Resolved - MRI brain with remote infarct.  Seen by neurology (5/14: MRI brain negative for acute stroke. No further inpatient neurologic workup indicated. Neurology to sign off) -TSH and ammonia within normal limits  Atrial fibrillation, chronic (HCC) On Eliquis as outpatient.   -resume eliquis  5/25  Left below-knee amputation (HCC) Chronic. Stable.  Essential hypertension Continue cozaar  DVT prophylaxis: Eliquis  Code Status: Full code    Status is: Inpatient  Remains inpatient appropriate because:  PT recs CIR  Subjective: No overnight events   Objective: Vitals:   02/28/22 1943 03/01/22 0027 03/01/22 0253 03/01/22 0733  BP: 107/63 128/62 115/64 116/63  Pulse: 86 81 81 77  Resp: 16 20 17 15   Temp: 97.9 F (36.6 C) (!) 97.5 F (36.4 C) (!) 97.4 F (36.3 C) (!) 97.5 F (36.4 C)  TempSrc: Oral Oral Oral Oral  SpO2: 99% 100% 98% 96%  Weight:      Height:        Intake/Output Summary (Last 24 hours) at 03/01/2022 0757 Last data filed at 03/01/2022 0500 Gross per 24 hour  Intake 240 ml  Output 2000 ml  Net -1760 ml   Filed Weights   02/25/22 0438 02/26/22 0500 02/27/22 0410  Weight: 100.6 kg 101.5 kg 101.6 kg   Physical examination:   General: Appearance:     Overweight male in no acute distress     Lungs:     respirations unlabored  Heart:  Normal heart rate.      Neurologic:   Awake, alert, oriented x 3. No apparent focal neurological           defect.      Data Reviewed:   CBC: Recent Labs  Lab 02/23/22 0116 02/26/22 0410 02/27/22 0320 02/28/22 0236  WBC 10.6* 9.5 9.8 11.7*  HGB 13.4 12.3* 11.5* 10.6*  HCT 38.8* 36.9* 34.5* 32.0*  MCV 81.9 83.1 83.1 82.7  PLT 254 214 190 210   Basic Metabolic  Panel: Recent Labs  Lab 02/23/22 0116 02/26/22 0410 02/27/22 0320 02/28/22 0236  NA 141 136 136 136  K 3.9 3.9 3.7 4.5  CL 110 107 108 108  CO2 24 23 22  20*  GLUCOSE 153* 232* 167* 293*  BUN 9 8 7* 12  CREATININE 0.75 0.99 0.81 0.75  CALCIUM 9.1 8.5* 8.5* 8.7*  MG 1.7  --   --  1.8   GFR: Estimated Creatinine Clearance: 111.5 mL/min (by C-G formula based on SCr of 0.75 mg/dL). Liver Function Tests: Recent Labs  Lab 02/28/22 0236  AST 17  ALT 21  ALKPHOS 47  BILITOT 0.5  PROT 6.6  ALBUMIN 2.3*    No results for input(s): LIPASE, AMYLASE in the last 168 hours. No results for input(s): AMMONIA in the last 168 hours.  Coagulation Profile: No results for input(s): INR, PROTIME in the last 168 hours.  Cardiac Enzymes: No results for input(s): CKTOTAL, CKMB, CKMBINDEX, TROPONINI in the last 168 hours. BNP (last 3 results) No results for input(s): PROBNP in the last 8760 hours. HbA1C: No results for input(s): HGBA1C in the last 72 hours.  CBG: Recent Labs  Lab 02/28/22 0646 02/28/22 1128 02/28/22 1608 02/28/22 2148 03/01/22 0621  GLUCAP 322* 307* 289* 278* 260*   Lipid Profile: No results for input(s): CHOL, HDL, LDLCALC, TRIG, CHOLHDL, LDLDIRECT in the last 72 hours.   Thyroid Function Tests: No results for input(s): TSH, T4TOTAL, FREET4, T3FREE, THYROIDAB in the last 72 hours.  Anemia Panel: No results for input(s): VITAMINB12, FOLATE, FERRITIN, TIBC, IRON, RETICCTPCT in the last 72 hours.  Sepsis Labs: No results for input(s): PROCALCITON, LATICACIDVEN in the last 168 hours.   Recent Results (from the past 240 hour(s))  MRSA Next Gen by PCR, Nasal     Status: None   Collection Time: 02/19/22  8:50 AM   Specimen: Nasal Mucosa; Nasal Swab  Result Value Ref Range Status   MRSA by PCR Next Gen NOT DETECTED NOT DETECTED Final    Comment: (NOTE) The GeneXpert MRSA Assay (FDA approved for NASAL specimens only), is one component of a comprehensive MRSA  colonization surveillance program. It is not intended to diagnose MRSA infection nor to guide or monitor treatment for MRSA infections. Test performance is not FDA approved in patients less than 33 years old. Performed at [facility], [address] [place], [place] [address]   Surgical PCR screen     Status: None   Collection Time: 02/20/22  4:12 AM   Specimen: Nasal Mucosa; Nasal Swab  Result Value Ref Range Status   MRSA, PCR NEGATIVE NEGATIVE Final   Staphylococcus aureus NEGATIVE NEGATIVE Final    Comment: (NOTE) The Xpert SA Assay (FDA approved for NASAL specimens in patients 37 years of age and older), is one component of a comprehensive surveillance program. It is not intended to diagnose infection nor to guide or monitor treatment. Performed at [facility], [address], [place], [place]  [address]   Culture, blood (Routine X 2) w Reflex to ID Panel     Status: None (Preliminary result)   Collection Time: 02/26/22 12:22 PM   Specimen: BLOOD RIGHT HAND  Result Value Ref Range Status   Specimen Description BLOOD RIGHT HAND  Final   Special Requests   Final    AEROBIC BOTTLE ONLY Blood Culture results may not be optimal due to an inadequate volume of blood received in culture bottles   Culture   Final    NO GROWTH 3 DAYS Performed at [facility], [place] [address] [place], [place] [address]    Report Status PENDING  Incomplete  Culture, blood (Routine X 2) w Reflex to ID Panel     Status: None (Preliminary result)   Collection Time: 02/26/22 12:25 PM   Specimen: BLOOD RIGHT HAND  Result Value Ref Range Status   Specimen Description BLOOD RIGHT HAND  Final   Special Requests   Final    AEROBIC BOTTLE ONLY Blood Culture results may not be optimal due to an inadequate volume of blood received in culture bottles   Culture   Final    NO GROWTH 3 DAYS Performed at [facility], [place] [address] [place], [place] [address]    Report Status PENDING   Incomplete  Aerobic/Anaerobic Culture w Gram Stain (surgical/deep wound)     Status: None (Preliminary result)   Collection Time: 02/27/22  6:19 PM   Specimen: PATH Digit amputation; Tissue  Result Value Ref Range Status   Specimen Description BONE  Final   Special Requests RIGHT FIFTH TOE  Final   Gram Stain NO WBC SEEN NO ORGANISMS SEEN   Final   Culture   Final    NO GROWTH < 12 HOURS Performed at [facility], [place] [address], [place], [place] [address]    Report Status PENDING  Incomplete     Radiology Studies: DG Foot Complete Right  Result Date: 02/27/2022 CLINICAL DATA:  Osteomyelitis left fifth metatarsal EXAM: RIGHT FOOT COMPLETE - 3+ VIEW COMPARISON:  02/15/2022 FINDINGS: There is interval surgical removal of right fifth toe. There is interval removal of right fifth metatarsal from the level of proximal shaft of fifth metatarsal. Small plantar spur is seen in calcaneus. Bony spurs are noted in the dorsal aspect of talonavicular joint. There is soft tissue swelling over the dorsum. IMPRESSION: Status post surgical removal of right fifth toe and partial resection of right fifth metatarsal. Electronically Signed   By: Elmer Picker M.D.   On: 02/27/2022 19:49   DG MINI C-ARM IMAGE ONLY  Result Date: 02/27/2022 There is no interpretation for this exam.  This order is for images obtained during a surgical procedure.  Please See "Surgeries" Tab for more information regarding the procedure.     Scheduled Meds:  apixaban  5 mg Oral BID   aspirin EC  81 mg Oral Daily   atorvastatin  80 mg Oral q1800   Chlorhexidine Gluconate Cloth  6 each Topical Daily   clopidogrel  75 mg Oral Q0600   insulin aspart  0-15 Units Subcutaneous TID WC   insulin aspart  0-5 Units Subcutaneous QHS   insulin aspart  8 Units Subcutaneous TID WC   insulin glargine-yfgn  26 Units Subcutaneous BID   losartan  25 mg Oral Daily   metroNIDAZOLE  500 mg Oral Q12H   pantoprazole  40 mg Oral Daily    polyethylene glycol  17 g  Oral Daily   sodium chloride flush  3 mL Intravenous Once   Continuous Infusions:  sodium chloride     sodium chloride Stopped (02/28/22 2204)   cefTRIAXone (ROCEPHIN)  IV 2 g (02/28/22 0835)   vancomycin 1,500 mg (03/01/22 0647)     LOS: 13 days     Geradine Girt, DO [facility] If 7PM-7AM, please contact night-coverage 03/01/2022, 7:57 AM

## 2022-03-01 NOTE — Progress Notes (Signed)
Inpatient Rehab Admissions Coordinator:   Awaiting determination from [facility] regarding CIR prior auth request.  Will continue to follow.   Estill Dooms, PT, DPT Admissions Coordinator [phone] 03/01/22  1:09 PM

## 2022-03-01 NOTE — Progress Notes (Signed)
Physical Therapy Treatment Patient Details Name: Austin [name] MRN: [id number] DOB: 1955/12/05 Today's Date: 03/01/2022   History of Present Illness Pt is a 66 y.o. 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 Brain MRI negative for acute injury. S/p RLE arteriogram and angioplasty of external iliac artery on 5/17. S/p RLE tibial bypass graft on 5/22. 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 PMH includes PVD s/p L BKA (10/2018) with prosthetic, DM, DVT, HTN, HLD.   PT Comments    Pt progressing with mobility. Today's session focused on standing trials with stedy frame and RLE therex; pt requiring modA+1-2 for mobility with stedy. Pt remains motivated to participate, noted improvements in fatigue/lethargy. Pt remains limited by generalized weakness, decreased activity tolerance, and impaired balance strategies/postural reactions. Pt remains an excellent candidate for intensive CIR-level therapies to maximize functional mobility and independence prior to return home.    Recommendations for follow up therapy are one component of a multi-disciplinary discharge planning process, led by the attending physician.  Recommendations may be updated based on patient status, additional functional criteria and insurance authorization.  Follow Up Recommendations  Acute inpatient rehab (3hours/day)     Assistance Recommended at Discharge Intermittent Supervision/Assistance  Patient can return home with the following Two people to help with walking and/or transfers;A lot of help with bathing/dressing/bathroom;Assistance with cooking/housework;Assist for transportation;Help with stairs or ramp for entrance   Equipment Recommendations   (TBD - lift equipment if home)    Recommendations for Other Services       Precautions / Restrictions Precautions Precautions: Fall;Other (comment) Precaution Comments: H/o L BKA (prosthetic in  room) Required Braces or Orthoses: Other Brace Other Brace: RLE darco shoe (in room) Restrictions Weight Bearing Restrictions: Yes Other Position/Activity Restrictions: WB through R heel for transfers only per podiatry     Mobility  Bed Mobility Overal bed mobility: Needs Assistance Bed Mobility: Supine to Sit     Supine to sit: Min assist, HOB elevated     General bed mobility comments: minA for HHA to elevate trunk, scooting hips to EOB without assist    Transfers Overall transfer level: Needs assistance Equipment used: Ambulation equipment used Transfers: Sit to/from Stand, Bed to chair/wheelchair/BSC Sit to Stand: Mod assist, +2 safety/equipment, +2 physical assistance           General transfer comment: ModA (+2 safety) initial stand from elevated EOB into stedy frame, heavy reliance on BUE support to pull to stand then heavy pushing through BUEs to extend trunk/LEs, difficulty achieving full bilateral knee/hip/trunk ext despite cues; modA+2 for eccentric lowering into recliner; modA+2 standing from low recliner height into stedy frame; repeated verbal cues for sequencing Transfer via Lift Equipment: Stedy  Ambulation/Gait                   Stairs             Wheelchair Mobility    Modified Rankin (Stroke Patients Only)       Balance Overall balance assessment: Needs assistance Sitting-balance support: Single extremity supported, No upper extremity supported Sitting balance-Leahy Scale: Fair Sitting balance - Comments: able to don L BKA prosthetic with assist for set-up; require assist to don R darco shoe   Standing balance support: Bilateral upper extremity supported, Reliant on assistive device for balance Standing balance-Leahy Scale: Poor Standing balance comment: can maintain static standing in stedy frame with heavy reliance on BUE support  Cognition Arousal/Alertness: Awake/alert Behavior During  Therapy: WFL for tasks assessed/performed Overall Cognitive Status: No family/caregiver present to determine baseline cognitive functioning Area of Impairment: Attention, Safety/judgement, Awareness, Problem solving, Following commands                   Current Attention Level: Selective   Following Commands: Follows multi-step commands with increased time Safety/Judgement: Decreased awareness of safety, Decreased awareness of deficits Awareness: Emergent Problem Solving: Slow processing, Requires verbal cues          Exercises General Exercises - Lower Extremity Long Arc Quad: AROM, Right, Seated, 10 reps Heel Slides: AROM, Right, Seated, 10 reps Straight Leg Raises: AAROM, Right, Seated    General Comments        Pertinent Vitals/Pain Pain Assessment Pain Assessment: Faces Faces Pain Scale: Hurts a little bit Pain Location: RLE incisions Pain Descriptors / Indicators: Tightness Pain Intervention(s): Limited activity within patient's tolerance, Monitored during session, Repositioned    Home Living                          Prior Function            PT Goals (current goals can now be found in the care plan section) Progress towards PT goals: Progressing toward goals    Frequency    Min 3X/week      PT Plan Current plan remains appropriate    Co-evaluation              AM-PAC PT "6 Clicks" Mobility   Outcome Measure  Help needed turning from your back to your side while in a flat bed without using bedrails?: A Little Help needed moving from lying on your back to sitting on the side of a flat bed without using bedrails?: A Little Help needed moving to and from a bed to a chair (including a wheelchair)?: Total Help needed standing up from a chair using your arms (e.g., wheelchair or bedside chair)?: Total Help needed to walk in hospital room?: Total Help needed climbing 3-5 steps with a railing? : Total 6 Click Score: 10    End of  Session Equipment Utilized During Treatment: Gait belt Activity Tolerance: Patient tolerated treatment well Patient left: in chair;with call bell/phone within reach;with chair alarm set;with nursing/sitter in room Nurse Communication: Mobility status;Need for lift equipment PT Visit Diagnosis: Other abnormalities of gait and mobility (R26.89);Pain     Time: 0092-3300 PT Time Calculation (min) (ACUTE ONLY): 26 min  Charges:  $Therapeutic Exercise: 8-22 mins $Therapeutic Activity: 8-22 mins                     Ina Homes, PT, DPT Acute Rehabilitation Services  Pager [phone] Office [phone]  Malachy Chamber 03/01/2022, 5:35 PM

## 2022-03-01 NOTE — Progress Notes (Signed)
Pharmacy Antibiotic Note  Austin [name] is a 66 y.o. male admitted on 02/15/2022 with sepsis and osteomyelitis .  Pharmacy has been consulted for vancomycin dosing.  Vanc peak 17 >> trough 15 >> AUC just under goal at 399.  Plan: Change vancomycin to 1500 mg IV Q12H. Goal AUC 400-550. Expected AUC 480. Also on ceftriaxone and metronidazole.  Height: 6\' 4"  (193 cm) Weight: 101.6 kg (223 lb 15.8 oz) IBW/kg (Calculated) : 86.8  Temp (24hrs), Avg:97.6 F (36.4 C), Min:97.4 F (36.3 C), Max:97.9 F (36.6 C)  Recent Labs  Lab 02/23/22 0116 02/23/22 0809 02/23/22 1758 02/26/22 0410 02/27/22 0320 02/28/22 0236 02/28/22 2224 03/01/22 0159  WBC 10.6*  --   --  9.5 9.8 11.7*  --   --   CREATININE 0.75  --   --  0.99 0.81 0.75  --   --   VANCOTROUGH  --   --  12*  --   --   --   --  15  VANCOPEAK  --  27*  --   --   --   --  17*  --     Estimated Creatinine Clearance: 111.5 mL/min (by C-G formula based on SCr of 0.75 mg/dL).    No Known Allergies   Thank you for allowing pharmacy to be a part of this patient's care.  Wynona Neat, PharmD, BCPS  03/01/2022 3:58 AM

## 2022-03-01 NOTE — Progress Notes (Signed)
Inpatient Diabetes Program Recommendations  AACE/ADA: New Consensus Statement on Inpatient Glycemic Control (2015)  Target Ranges:  Prepandial:   less than 140 mg/dL      Peak postprandial:   less than 180 mg/dL (1-2 hours)      Critically ill patients:  140 - 180 mg/dL    Latest Reference Range & Units 02/28/22 06:46 02/28/22 11:28 02/28/22 16:08 02/28/22 21:48  Glucose-Capillary 70 - 99 mg/dL 977 (H)  16 units Novolog  22 units Semglee @0837   307 (H)  16 units Novolog  289 (H)  13 units Novolog  278 (H)  3 units Novolog  22 units Semglee @2200   (H): Data is abnormally high  Latest Reference Range & Units 03/01/22 06:21  Glucose-Capillary 70 - 99 mg/dL (H)  (H): Data is abnormally high     Home DM Meds: Toujeo 60 units QHS      Humalog 10 units TID      Trulicity 0.75 mg Weekly  Current Orders: Semglee 22 units BID      Novolog Moderate Correction Scale/ SSI (0-15 units) TID AC + HS      Novolog 5 units TID with meals    MD- Note CBGs remain >250.  Please consider:  1. Increase Semglee to 26 units BID (20% increase)  2. Increase Novolog Meal Coverage to 8 units TID with meals    --Will follow patient during hospitalization--  03/03/22 RN, MSN, CDE Diabetes Coordinator Inpatient Glycemic Control Team Team Pager: [phone] (8a-5p)

## 2022-03-01 NOTE — Progress Notes (Addendum)
Progress Note    03/01/2022 7:46 AM 2 Days Post-Op  Subjective:  no complaints   Vitals:   03/01/22 0253 03/01/22 0733  BP: 115/64 116/63  Pulse: 81 77  Resp: 17 15  Temp: (!) 97.4 F (36.3 C) (!) 97.5 F (36.4 C)  SpO2: 98% 96%   Physical Exam: Cardiac:  regular Lungs:  non labored Incisions:  Left groin, left leg incisions are all clean, dry and intact without swelling or hematoma Extremities:  LLE well perfused and warm with PT/Peroneal signals. Left foot dressed Neurologic: Alert and oriented  CBC    Component Value Date/Time   WBC 11.7 (H) 02/28/2022 0236   RBC 3.87 (L) 02/28/2022 0236   HGB 10.6 (L) 02/28/2022 0236   HGB 14.1 11/17/2013 0621   HCT 32.0 (L) 02/28/2022 0236   HCT 41.0 02/16/2022 0608   PLT 210 02/28/2022 0236   PLT 218 11/17/2013 0621   MCV 82.7 02/28/2022 0236   MCV 86 11/17/2013 0621   MCH 27.4 02/28/2022 0236   MCHC 33.1 02/28/2022 0236   RDW 15.4 02/28/2022 0236   RDW 13.6 11/17/2013 0621   LYMPHSABS 2.2 02/16/2022 0530   LYMPHSABS 2.4 11/17/2013 0621   MONOABS 1.3 (H) 02/16/2022 0530   MONOABS 0.9 11/17/2013 0621   EOSABS 0.0 02/16/2022 0530   EOSABS 0.1 11/17/2013 0621   BASOSABS 0.0 02/16/2022 0530   BASOSABS 0.0 11/17/2013 0621    BMET    Component Value Date/Time   NA 136 02/28/2022 0236   NA 135 (L) 11/17/2013 0621   K 4.5 02/28/2022 0236   K 4.3 11/17/2013 0621   CL 108 02/28/2022 0236   CL 99 11/17/2013 0621   CO2 20 (L) 02/28/2022 0236   CO2 31 11/17/2013 0621   GLUCOSE 293 (H) 02/28/2022 0236   GLUCOSE 172 (H) 11/17/2013 0621   BUN 12 02/28/2022 0236   BUN 10 11/17/2013 0621   CREATININE 0.75 02/28/2022 0236   CREATININE 0.85 11/17/2013 0621   CALCIUM 8.7 (L) 02/28/2022 0236   CALCIUM 9.6 11/17/2013 0621   GFRNONAA >60 02/28/2022 0236   GFRNONAA >60 11/17/2013 0621   GFRAA >60 01/10/2020 0808   GFRAA >60 11/17/2013 0621    INR    Component Value Date/Time   INR 1.3 (H) 02/15/2022 2051      Intake/Output Summary (Last 24 hours) at 03/01/2022 0746 Last data filed at 03/01/2022 0500 Gross per 24 hour  Intake 480 ml  Output 2500 ml  Net -2020 ml     Assessment/Plan:  66 y.o. male is s/p right common femoral to TP trunk bypass with ipsilateral nonreversed great saphenous vein  2 Days Post-Op   Left leg incisions are clean, dry and intact PT/Peroneal signals Toe care with Podiatry Okay for discharge from vascular standpoint Will arrange follow up in 2-3 weeks for incision check   Graceann Congress, PA-C [facility] [phone] 03/01/2022 7:46 AM  I have seen and evaluated the patient. I agree with the PA note as documented above.  Postop day 4 status post right common femoral to TP trunk bypass with ipsilateral nonreversed great saphenous vein.  Still has an excellent pulse in the bypass tunneled through the saphenectomy incisions.  Brisk AT peroneal signal at the ankle.  Wound care per podiatry.Would recommend aspirin Plavix for right iliac stent for at least 1 month and then can go to just aspirin.  Will arrange follow-up in 2-3 weeks in our office for incision checks.  Cristal Deer  Addison Naegeli, MD [facility] [place] Office: [phone]

## 2022-03-02 DIAGNOSIS — M869 Osteomyelitis, unspecified: Secondary | ICD-10-CM | POA: Diagnosis not present

## 2022-03-02 DIAGNOSIS — G9341 Metabolic encephalopathy: Secondary | ICD-10-CM | POA: Diagnosis not present

## 2022-03-02 DIAGNOSIS — L97513 Non-pressure chronic ulcer of other part of right foot with necrosis of muscle: Secondary | ICD-10-CM | POA: Diagnosis not present

## 2022-03-02 DIAGNOSIS — E11621 Type 2 diabetes mellitus with foot ulcer: Secondary | ICD-10-CM | POA: Diagnosis not present

## 2022-03-02 LAB — CBC
HCT: 30.9 % — ABNORMAL LOW (ref 39.0–52.0)
Hemoglobin: 10.3 g/dL — ABNORMAL LOW (ref 13.0–17.0)
MCH: 27.8 pg (ref 26.0–34.0)
MCHC: 33.3 g/dL (ref 30.0–36.0)
MCV: 83.5 fL (ref 80.0–100.0)
Platelets: 226 10*3/uL (ref 150–400)
RBC: 3.7 MIL/uL — ABNORMAL LOW (ref 4.22–5.81)
RDW: 15.6 % — ABNORMAL HIGH (ref 11.5–15.5)
WBC: 9.2 10*3/uL (ref 4.0–10.5)
nRBC: 0 % (ref 0.0–0.2)

## 2022-03-02 LAB — BASIC METABOLIC PANEL
Anion gap: 9 (ref 5–15)
BUN: 14 mg/dL (ref 8–23)
CO2: 22 mmol/L (ref 22–32)
Calcium: 8.5 mg/dL — ABNORMAL LOW (ref 8.9–10.3)
Chloride: 109 mmol/L (ref 98–111)
Creatinine, Ser: 1.01 mg/dL (ref 0.61–1.24)
GFR, Estimated: >60 mL/min (ref >60)
Glucose, Bld: 258 mg/dL — ABNORMAL HIGH (ref 70–99)
Potassium: 4 mmol/L (ref 3.5–5.1)
Sodium: 140 mmol/L (ref 135–145)

## 2022-03-02 LAB — GLUCOSE, CAPILLARY
Glucose-Capillary: 239 mg/dL — ABNORMAL HIGH (ref 70–99)
Glucose-Capillary: 241 mg/dL — ABNORMAL HIGH (ref 70–99)
Glucose-Capillary: 255 mg/dL — ABNORMAL HIGH (ref 70–99)
Glucose-Capillary: 230 mg/dL — ABNORMAL HIGH (ref 70–99)

## 2022-03-02 MED ORDER — INSULIN ASPART 100 UNIT/ML IJ SOLN
10.0000 [IU] | Freq: Three times a day (TID) | INTRAMUSCULAR | Status: DC
  Administered 2022-03-02 – 2022-03-08 (×12): 10 [IU] via SUBCUTANEOUS

## 2022-03-02 MED ORDER — SODIUM CHLORIDE 0.9 % IV SOLN
1.5000 g | Freq: Four times a day (QID) | INTRAVENOUS | Status: DC
  Filled 2022-03-02 (×3): qty 4

## 2022-03-02 MED ORDER — SODIUM CHLORIDE 0.9 % IV SOLN
3.0000 g | Freq: Four times a day (QID) | INTRAVENOUS | Status: DC
  Administered 2022-03-02 – 2022-03-03 (×3): 3 g via INTRAVENOUS
  Filled 2022-03-02 (×5): qty 8

## 2022-03-02 MED ORDER — INSULIN GLARGINE-YFGN 100 UNIT/ML ~~LOC~~ SOLN
30.0000 [IU] | Freq: Two times a day (BID) | SUBCUTANEOUS | Status: DC
  Administered 2022-03-02 – 2022-03-08 (×12): 30 [IU] via SUBCUTANEOUS
  Filled 2022-03-02 (×13): qty 0.3

## 2022-03-02 NOTE — Assessment & Plan Note (Signed)
-   s/p right iliac artery stent on 02/20/2022 followed by right common femoral artery to tibial peroneal trunk bypass with ipsilateral nonreversed great saphenous vein on 5/22. -Recommended for aspirin and Plavix for 1 month then aspirin -Follow-up with vascular surgery in 2 to 3 weeks for incision checks

## 2022-03-02 NOTE — Progress Notes (Signed)
Progress Note    Austin [name]   WNU:[id number]  DOB: 1956-08-15  DOA: 02/15/2022     14 PCP: Austin Fleeting, MD  Initial CC: right foot wound, AMS  Hospital Course: Austin [name] is a 66 yo male with PMH CAD, DM II, HLD, HTN, PVD s/p left BKA who presented with weakness and altered mentation.  Work-up revealed diabetic foot ulcer involving his right foot.  He underwent evaluations with podiatry, ID, and vascular surgery. He underwent right iliac artery stent on 02/20/2022 followed by right common femoral artery to tibial peroneal trunk bypass with ipsilateral nonreversed great saphenous vein on 5/22. This was then followed by partial fifth ray amputation right foot on 02/27/2022. He has since been evaluated for going to CIR at discharge.  Interval History:  No events overnight. Tentative plan for CIR at time of discharge. He seems to be recovering well and he had no concerns this morning.   Assessment and Plan: * Diabetic foot ulcer associated with type 2 diabetes mellitus (HCC) - s/p partial fifth ray amputation right foot on 02/27/2022 -Group B strep noted on wound culture from surgery - see OM  Osteomyelitis (HCC) - s/p partial 5th ray amputation right foot 02/27/22 - ID following as well - currently on Rocephin and flagyl; wound culture noted with GBS - will follow up with ID regarding final plan for abx to complete course   Peripheral vascular disease (HCC) - s/p right iliac artery stent on 02/20/2022 followed by right common femoral artery to tibial peroneal trunk bypass with ipsilateral nonreversed great saphenous vein on 5/22. -Recommended for aspirin and Plavix for 1 month then aspirin -Follow-up with vascular surgery in 2 to 3 weeks for incision checks  Acute metabolic encephalopathy-resolved as of 03/02/2022 - MRI brain with remote infarct.  Seen by neurology (5/14: MRI brain negative for acute stroke. No further inpatient neurologic workup indicated. Neurology  to sign off) -TSH and ammonia within normal limits  Sepsis with acute organ dysfunction without septic shock (HCC)-resolved as of 03/02/2022 - initially had fever, tachycardia; source foot ulcer - see above  Atrial fibrillation, chronic (HCC) - Continue eliquis and lopressor  Status post below-knee amputation (HCC) - left side - Patient has prosthesis  Type 2 diabetes mellitus with diabetic peripheral angiopathy without gangrene (HCC) - A1c 11.3% on 02/17/2022 - Continue Semglee and NovoLog  Essential hypertension - Continue cozaar   Old records reviewed in assessment of this patient  Antimicrobials: Cefepime 5/13 >> 5/16 Rocephin 5/17 >>current Flagyl 5/17 >> current  Vanc 5/13 >> 5/26  DVT prophylaxis:  SCD's Start: 02/25/22 1759 SCDs Start: 02/16/22 0311 apixaban (ELIQUIS) tablet 5 mg   Code Status:   Code Status: Full Code  Disposition Plan:  Possibly CIR Status is: Inpt  Objective: Blood pressure 121/71, pulse 69, temperature 97.6 F (36.4 C), temperature source Oral, resp. rate 20, height 6\' 4"  (1.93 m), weight 101.6 kg, SpO2 98 %.  Examination:  Physical Exam Constitutional:      General: He is not in acute distress.    Appearance: Normal appearance.  HENT:     Head: Normocephalic and atraumatic.     Mouth/Throat:     Mouth: Mucous membranes are moist.  Eyes:     Extraocular Movements: Extraocular movements intact.  Cardiovascular:     Rate and Rhythm: Normal rate and regular rhythm.  Pulmonary:     Effort: Pulmonary effort is normal. No respiratory distress.     Breath sounds: Normal breath  sounds. No wheezing.  Abdominal:     General: Bowel sounds are normal. There is no distension.     Palpations: Abdomen is soft.     Tenderness: There is no abdominal tenderness.  Musculoskeletal:     Cervical back: Normal range of motion and neck supple.     Comments: Right lower extremity noted with 3 large incisions healing well and right foot in surgical  dressing; left BKA appreciated with small chronic scab on anterior surface   Skin:    General: Skin is warm and dry.  Neurological:     General: No focal deficit present.     Mental Status: He is alert.  Psychiatric:        Mood and Affect: Mood normal.        Behavior: Behavior normal.     Consultants: ID Podiatry Vascular surgery  Procedures:  Right iliac artery stent on 02/20/2022 Right common femoral artery to tibial peroneal trunk bypass with ipsilateral nonreversed great saphenous vein on 5/22 Partial fifth ray amputation right foot on 02/27/2022  Data Reviewed: Results for orders placed or performed during the hospital encounter of 02/15/22 (from the past 24 hour(s))  Glucose, capillary     Status: Abnormal   Collection Time: 03/01/22  4:24 PM  Result Value Ref Range   Glucose-Capillary 279 (H) 70 - 99 mg/dL  Glucose, capillary     Status: Abnormal   Collection Time: 03/01/22  9:20 PM  Result Value Ref Range   Glucose-Capillary 267 (H) 70 - 99 mg/dL  CBC     Status: Abnormal   Collection Time: 03/02/22  1:57 AM  Result Value Ref Range   WBC 9.2 4.0 - 10.5 K/uL   RBC 3.70 (L) 4.22 - 5.81 MIL/uL   Hemoglobin 10.3 (L) 13.0 - 17.0 g/dL   HCT 03.530.9 (L) 00.939.0 - 38.152.0 %   MCV 83.5 80.0 - 100.0 fL   MCH 27.8 26.0 - 34.0 pg   MCHC 33.3 30.0 - 36.0 g/dL   RDW 82.915.6 (H) 93.711.5 - 16.915.5 %   Platelets 226 150 - 400 K/uL   nRBC 0.0 0.0 - 0.2 %  Basic metabolic panel     Status: Abnormal   Collection Time: 03/02/22  1:57 AM  Result Value Ref Range   Sodium 140 135 - 145 mmol/L   Potassium 4.0 3.5 - 5.1 mmol/L   Chloride 109 98 - 111 mmol/L   CO2 22 22 - 32 mmol/L   Glucose, Bld 258 (H) 70 - 99 mg/dL   BUN 14 8 - 23 mg/dL   Creatinine, Ser 6.781.01 0.61 - 1.24 mg/dL   Calcium 8.5 (L) 8.9 - 10.3 mg/dL   GFR, Estimated >93>60 >81>60 mL/min   Anion gap 9 5 - 15  Glucose, capillary     Status: Abnormal   Collection Time: 03/02/22  6:30 AM  Result Value Ref Range   Glucose-Capillary 239 (H)  70 - 99 mg/dL  Glucose, capillary     Status: Abnormal   Collection Time: 03/02/22 11:50 AM  Result Value Ref Range   Glucose-Capillary 241 (H) 70 - 99 mg/dL   Comment 1 Notify RN     I have Reviewed nursing notes, Vitals, and Lab results since pt's last encounter. Pertinent lab results: see above I have ordered test including BMP, CBC, Mg I have reviewed the last note from staff over past 24 hours I have discussed pt's care plan and test results with nursing staff, case manager  LOS: 14 days   Lewie Chamber, MD [facility] 03/02/2022, 1:59 PM

## 2022-03-02 NOTE — Progress Notes (Addendum)
Mobility Specialist: Progress Note   03/02/22 1448  Mobility  Activity Transferred from bed to chair;Transferred to/from [facility]  Level of Assistance Moderate assist, patient does 50-74%  Assistive Device Front wheel walker  RLE Weight Bearing PWB  Distance Ambulated (ft) 8 ft (4'+2'+2')  Activity Response Tolerated well  $Mobility charge 1 Mobility   Pt received in the bed and agreeable to mobility. Pt able to take a few steps to the chair and transfer to/from [facility] today. Pt unable to maintain WB through heel in darco shoe despite frequent cueing. Heavy modA to stand from the chair and BSC, BM successful. Pt assisted with pericare with help from RN. Pt back to chair after session with call bell and phone in reach. Chair alarm is on. Recommend +2 for physical assistance/chair follow for next bout of ambulation.   [facility] Jillian Warth Mobility Specialist Mobility Specialist 5 North: [phone] Mobility Specialist 6 North: [phone]

## 2022-03-02 NOTE — Progress Notes (Signed)
Pharmacy Antibiotic Note  Austin [name] is a 66 y.o. male admitted on 02/15/2022 with sepsis and concern for osteomyelitis. Pharmacy has been consulted to transition antibiotics unasyn.  Plan: Unasyn 3g q6h  F/u renal function and length of therapy  Height: 6\' 4"  (193 cm) Weight: 101.6 kg (223 lb 15.8 oz) IBW/kg (Calculated) : 86.8  Temp (24hrs), Avg:97.7 F (36.5 C), Min:97.3 F (36.3 C), Max:98.1 F (36.7 C)  Recent Labs  Lab 02/23/22 1758 02/26/22 0410 02/27/22 0320 02/28/22 0236 02/28/22 2224 03/01/22 0159 03/02/22 0157  WBC  --  9.5 9.8 11.7*  --   --  9.2  CREATININE  --  0.99 0.81 0.75  --   --  1.01  VANCOTROUGH 12*  --   --   --   --  15  --   VANCOPEAK  --   --   --   --  17*  --   --     Estimated Creatinine Clearance: 88.3 mL/min (by C-G formula based on SCr of 1.01 mg/dL).    No Known Allergies  Antimicrobials this admission: Vancomycin 5/12 >> 5/26  Cefepime 5/13 >>5/17  Ceftriaxone 5/12 x 1, 5/17 > 5/27 Flagyl 5/13 x 1, 5/17> 5/27 Unasyn 5/27 >  Microbiology results: 5/12 BCx: ngF 5/23 BCx: ngtd 5/24 R fifth toe tissue: ngtd  Thank you for allowing pharmacy to be a part of this patient's care.  6/24 03/02/2022 5:12 PM

## 2022-03-02 NOTE — Progress Notes (Signed)
Mobility Specialist: Progress Note   03/02/22 1645  Mobility  Activity Transferred from chair to bed  Level of Assistance Moderate assist, patient does 50-74%  Assistive Device Front wheel walker  RLE Weight Bearing PWB  Distance Ambulated (ft) 2 ft  Activity Response Tolerated well  $Mobility charge 1 Mobility   Pt assisted back to bed. Heavy modA to stand and pivot to the bed. Pt back in bed with call bell and phone at his side. Bed alarm is on.   [facility] Marisue Canion Mobility Specialist Mobility Specialist 5 North: [phone] Mobility Specialist 6 North: [phone]

## 2022-03-02 NOTE — Assessment & Plan Note (Addendum)
-   s/p partial 5th ray amputation right foot 02/27/22 - ID following as well -Antibiotics de-escalated from Rocephin/Flagyl down to Unasyn on 03/02/2022 to cover GBS. Culture then updated on 5/28 to include rare Staph thus vanc added per ID; now has speciated to MSSA so vanc d'c once again - continue monotherapy unasyn with plans to transition to Augmentin at discharge to complete course on 03/27/22 per ID

## 2022-03-02 NOTE — Hospital Course (Signed)
Austin [name] is a 66 yo male with PMH CAD, DM II, HLD, HTN, PVD s/p left BKA who presented with weakness and altered mentation.  Work-up revealed diabetic foot ulcer involving his right foot.  He underwent evaluations with podiatry, ID, and vascular surgery. He underwent right iliac artery stent on 02/20/2022 followed by right common femoral artery to tibial peroneal trunk bypass with ipsilateral nonreversed great saphenous vein on 5/22. This was then followed by partial fifth ray amputation right foot on 02/27/2022. He has since been evaluated for going to CIR at discharge.

## 2022-03-02 NOTE — Progress Notes (Signed)
       Date: 03/02/2022  Patient name: Austin [name]  Medical record number: [id number]  Date of birth: 31-Aug-1956    Cultures from OR with GBS  Will switch to unasyn to cover this and anaerobes  Acey Lav 03/02/2022, 5:04 PM

## 2022-03-03 DIAGNOSIS — M869 Osteomyelitis, unspecified: Secondary | ICD-10-CM | POA: Diagnosis not present

## 2022-03-03 DIAGNOSIS — L97513 Non-pressure chronic ulcer of other part of right foot with necrosis of muscle: Secondary | ICD-10-CM | POA: Diagnosis not present

## 2022-03-03 DIAGNOSIS — E11621 Type 2 diabetes mellitus with foot ulcer: Secondary | ICD-10-CM | POA: Diagnosis not present

## 2022-03-03 LAB — GLUCOSE, CAPILLARY
Glucose-Capillary: 144 mg/dL — ABNORMAL HIGH (ref 70–99)
Glucose-Capillary: 233 mg/dL — ABNORMAL HIGH (ref 70–99)
Glucose-Capillary: 230 mg/dL — ABNORMAL HIGH (ref 70–99)
Glucose-Capillary: 217 mg/dL — ABNORMAL HIGH (ref 70–99)

## 2022-03-03 LAB — CULTURE, BLOOD (ROUTINE X 2)
Culture: NO GROWTH
Culture: NO GROWTH

## 2022-03-03 MED ORDER — VANCOMYCIN HCL 1500 MG/300ML IV SOLN
1500.0000 mg | Freq: Two times a day (BID) | INTRAVENOUS | Status: DC
  Administered 2022-03-04 – 2022-03-05 (×3): 1500 mg via INTRAVENOUS
  Filled 2022-03-03 (×3): qty 300

## 2022-03-03 MED ORDER — SODIUM CHLORIDE 0.9 % IV SOLN
3.0000 g | Freq: Four times a day (QID) | INTRAVENOUS | Status: DC
  Administered 2022-03-03 – 2022-03-07 (×16): 3 g via INTRAVENOUS
  Filled 2022-03-03 (×17): qty 8

## 2022-03-03 MED ORDER — VANCOMYCIN HCL 2000 MG/400ML IV SOLN
2000.0000 mg | Freq: Once | INTRAVENOUS | Status: AC
  Administered 2022-03-03: 2000 mg via INTRAVENOUS
  Filled 2022-03-03: qty 400

## 2022-03-03 NOTE — Progress Notes (Signed)
         Date: 03/03/2022  Patient name: Austin [name]  Medical record number: [id number]  Date of birth: 16-Mar-1956   Cultures from the OR are growing group B streptococcus yesterday and so I narrowed him to Unasyn but they are also growing rare Staphylococcus aureus species.  We will therefore add back vancomycin as well pending Staphylococcus aureus susceptibilities   Acey Lav 03/03/2022, 3:57 PM

## 2022-03-03 NOTE — Progress Notes (Signed)
Pharmacy Antibiotic Note  Austin [name] is a 66 y.o. male admitted on 02/15/2022 with sepsis and concern for osteomyelitis 2/2 to diabetic foot ulcer. Now s/p amputation on 5/24 with cultures growing rare group B strep and staph aureus. Pharmacy has been consulted to dose unasyn and vancomycin.  Goal AUC: 400-550   Plan: Vancomycin 2000mg  x 1 load, followed by 1500mg  q12h (eAUC 543, Scr 1.01)  Continue Unasyn 3g q6h  F/u renal function and length of therapy  Height: 6\' 4"  (193 cm) Weight: 98.7 kg (217 lb 9.5 oz) IBW/kg (Calculated) : 86.8  Temp (24hrs), Avg:98 F (36.7 C), Min:97.2 F (36.2 C), Max:98.6 F (37 C)  Recent Labs  Lab 02/26/22 0410 02/27/22 0320 02/28/22 0236 02/28/22 2224 03/01/22 0159 03/02/22 0157  WBC 9.5 9.8 11.7*  --   --  9.2  CREATININE 0.99 0.81 0.75  --   --  1.01  VANCOTROUGH  --   --   --   --  15  --   VANCOPEAK  --   --   --  17*  --   --      Estimated Creatinine Clearance: 88.3 mL/min (by C-G formula based on SCr of 1.01 mg/dL).    No Known Allergies  Antimicrobials this admission: Vancomycin 5/12 >> 5/26, 5/28 >  Cefepime 5/13 >>5/17  Ceftriaxone 5/12 x 1, 5/17 > 5/27 Flagyl 5/13 x 1, 5/17> 5/27 Unasyn 5/27 >  Microbiology results: 5/12 BCx: ngF 5/23 BCx: ngtd 5/24 R fifth toe tissue: rare group B strep and staph aureus  Thank you for allowing pharmacy to be a part of this patient's care.  Levonne Spiller 03/03/2022 4:13 PM

## 2022-03-03 NOTE — Progress Notes (Signed)
Mobility Specialist: Progress Note   03/03/22 1416  Mobility  Activity Ambulated with assistance in room  Level of Assistance Moderate assist, patient does 50-74%  Assistive Device Front wheel walker  RLE Weight Bearing PWB  Distance Ambulated (ft) 10 ft  Activity Response Tolerated well  $Mobility charge 1 Mobility   Pre-Mobility: 88 HR Post-Mobility: 84 HR  Pt received in the bed and agreeable to mobility. No c/o throughout. MinA with bed mobility and modA to stand at [facility] with elevated bed height. Pt unable to maintain heel WB with dacro shoe. Verbal cues and physical assist for RW management and safety throughout as well. Pt is in the chair with call bell and phone in reach. Chair alarm is on.   [place] Lennox Leikam Mobility Specialist Mobility Specialist 5 North: [phone] Mobility Specialist 6 North: [phone]

## 2022-03-03 NOTE — Progress Notes (Signed)
Progress Note    Austin [name]   [id number]  DOB: [birth year]  DOA: 02/15/2022     15 PCP: Austin Burrow, MD  Initial CC: right foot wound, AMS  Hospital Course: Austin [name] is a 66 yo male with PMH CAD, DM II, HLD, HTN, PVD s/p left BKA who presented with weakness and altered mentation.  Work-up revealed diabetic foot ulcer involving his right foot.  He underwent evaluations with podiatry, ID, and vascular surgery. He underwent right iliac artery stent on 02/20/2022 followed by right common femoral artery to tibial peroneal trunk bypass with ipsilateral nonreversed great saphenous vein on 5/22. This was then followed by partial fifth ray amputation right foot on 02/27/2022. He has since been evaluated for going to CIR at discharge.  Interval History:  No events overnight.  Little more lethargic this morning when seen but otherwise seems to be doing okay.  Assessment and Plan: * Diabetic foot ulcer associated with type 2 diabetes mellitus ([place]) - s/p partial fifth ray amputation right foot on 02/27/2022 -Group B strep noted on wound culture from surgery; see OM -Weight-bear minimally RLE for transition purposes only per podiatry recommendations  Osteomyelitis of fifth toe of right foot ([place]) - s/p partial 5th ray amputation right foot 02/27/22 - ID following as well -Antibiotics de-escalated from Rocephin/Flagyl down to Unasyn on 03/02/2022  Peripheral vascular disease ([place]) - s/p right iliac artery stent on 02/20/2022 followed by right common femoral artery to tibial peroneal trunk bypass with ipsilateral nonreversed great saphenous vein on 5/22. -Recommended for aspirin and Plavix for 1 month then aspirin -Follow-up with vascular surgery in 2 to 3 weeks for incision checks  Acute metabolic encephalopathy-resolved as of 03/02/2022 - MRI brain with remote infarct.  Seen by neurology (5/14: MRI brain negative for acute stroke. No further inpatient neurologic workup  indicated. Neurology to sign off) -TSH and ammonia within normal limits  Sepsis with acute organ dysfunction without septic shock (HCC)-resolved as of 03/02/2022 - initially had fever, tachycardia; source foot ulcer - see above  Atrial fibrillation, chronic (HCC) - Continue eliquis and lopressor  Status post below-knee amputation ([place]) - left side - Patient has prosthesis  Type 2 diabetes mellitus with diabetic peripheral angiopathy without gangrene ([place]) - A1c 11.3% on 02/17/2022 - Continue Semglee and NovoLog  Essential hypertension - Continue cozaar   Old records reviewed in assessment of this patient  Antimicrobials: Cefepime 5/13 >> 5/16 Rocephin 5/17 >>5/27 Flagyl 5/17 >> 5/27 Vanc 5/13 >> 5/26 Unasyn 03/02/2022 >> current  DVT prophylaxis:  SCD's Start: 02/25/22 1759 SCDs Start: 02/16/22 0311 apixaban (ELIQUIS) tablet 5 mg   Code Status:   Code Status: Full Code  Disposition Plan:  Possibly CIR Status is: Inpt  Objective: Blood pressure 119/64, pulse 69, temperature (!) 97.2 F (36.2 C), temperature source Oral, resp. rate 19, height 6\' 4"  (1.93 m), weight 98.7 kg, SpO2 98 %.  Examination:  Physical Exam Constitutional:      General: He is not in acute distress.    Appearance: Normal appearance.  HENT:     Head: Normocephalic and atraumatic.     Mouth/Throat:     Mouth: Mucous membranes are moist.  Eyes:     Extraocular Movements: Extraocular movements intact.  Cardiovascular:     Rate and Rhythm: Normal rate and regular rhythm.  Pulmonary:     Effort: Pulmonary effort is normal. No respiratory distress.     Breath sounds: Normal breath sounds. No wheezing.  Abdominal:  General: Bowel sounds are normal. There is no distension.     Palpations: Abdomen is soft.     Tenderness: There is no abdominal tenderness.  Musculoskeletal:     Cervical back: Normal range of motion and neck supple.     Comments: Right lower extremity noted with 3 large  incisions healing well and right foot in surgical dressing; left BKA appreciated with small chronic scab on anterior surface   Skin:    General: Skin is warm and dry.  Neurological:     General: No focal deficit present.     Mental Status: He is alert.  Psychiatric:        Mood and Affect: Mood normal.        Behavior: Behavior normal.     Consultants: ID Podiatry Vascular surgery  Procedures:  Right iliac artery stent on 02/20/2022 Right common femoral artery to tibial peroneal trunk bypass with ipsilateral nonreversed great saphenous vein on 5/22 Partial fifth ray amputation right foot on 02/27/2022  Data Reviewed: Results for orders placed or performed during the hospital encounter of 02/15/22 (from the past 24 hour(s))  Glucose, capillary     Status: Abnormal   Collection Time: 03/02/22  4:24 PM  Result Value Ref Range   Glucose-Capillary 255 (H) 70 - 99 mg/dL   Comment 1 Notify RN   Glucose, capillary     Status: Abnormal   Collection Time: 03/02/22  9:17 PM  Result Value Ref Range   Glucose-Capillary 230 (H) 70 - 99 mg/dL   Comment 1 Notify RN    Comment 2 Document in Chart   Glucose, capillary     Status: Abnormal   Collection Time: 03/03/22  6:16 AM  Result Value Ref Range   Glucose-Capillary 144 (H) 70 - 99 mg/dL   Comment 1 Notify RN    Comment 2 Document in Chart   Glucose, capillary     Status: Abnormal   Collection Time: 03/03/22 11:25 AM  Result Value Ref Range   Glucose-Capillary 233 (H) 70 - 99 mg/dL    I have Reviewed nursing notes, Vitals, and Lab results since pt's last encounter. Pertinent lab results: see above I have ordered test including BMP, CBC, Mg I have reviewed the last note from staff over past 24 hours I have discussed pt's care plan and test results with nursing staff, case manager   LOS: 15 days   Austin Dee, MD [facility] 03/03/2022, 12:40 PM

## 2022-03-03 NOTE — Progress Notes (Signed)
Mobility Specialist: Progress Note   03/03/22 1630  Mobility  Activity Transferred from chair to bed  Level of Assistance Moderate assist, patient does 50-74%  Assistive Device Front wheel walker  RLE Weight Bearing PWB  Distance Ambulated (ft) 2 ft  Activity Response Tolerated fair  $Mobility charge 1 Mobility   Pt assisted back to bed per request. Assisted with pericare with help from NT d/t BM in the chair. Pt is back in the bed with NT present in the room.   [facility] Ashaunte Standley Mobility Specialist Mobility Specialist 5 North: [phone] Mobility Specialist 6 North: [phone]

## 2022-03-03 NOTE — Progress Notes (Signed)
Subjective: 4 Days Post-Op Procedure(s) (LRB): RIGHT PARTIAL AMPUTATION FOOT (Right) Partial fifth ray amputation RT foot.  DOS: 02/27/2022.  Patient resting comfortably on bed.  Dressings intact.  Objective: Vital signs in last 24 hours: Temp:  [97.6 F (36.4 C)-98.6 F (37 C)] 98.6 F (37 C) (05/28 0733) Pulse Rate:  [69-86] 82 (05/28 0733) Resp:  [14-20] 14 (05/28 0733) BP: (121-154)/(62-86) 129/69 (05/28 0733) SpO2:  [95 %-100 %] 95 % (05/28 0733) Weight:  [98.7 kg] 98.7 kg (05/28 0500)  Recent Labs    03/02/22 0157  HGB 10.3*   Recent Labs    03/02/22 0157  WBC 9.2  RBC 3.70*  HCT 30.9*  PLT 226   Recent Labs    03/02/22 0157  NA 140  K 4.0  CL 109  CO2 22  BUN 14  CREATININE 1.01  GLUCOSE 258*  CALCIUM 8.5*   No results for input(s): LABPT, INR in the last 72 hours.     Incision remains well coapted.  Staples and sutures intact.  No active bleeding or drainage.  Please see above noted photo update  Assessment/Plan: 4 Days Post-Op Procedure(s) (LRB): RIGHT PARTIAL AMPUTATION FOOT (Right) DOS: 02/27/2022 -Dressings changed.   -ABX as per ID.  -BKA LLE.  Patient may continue to weight-bear minimally RLE for transition purposes only - Planned discharge to CIR   Felecia Shelling 03/03/2022, 11:15 AM

## 2022-03-04 DIAGNOSIS — E11621 Type 2 diabetes mellitus with foot ulcer: Secondary | ICD-10-CM | POA: Diagnosis not present

## 2022-03-04 DIAGNOSIS — M869 Osteomyelitis, unspecified: Secondary | ICD-10-CM | POA: Diagnosis not present

## 2022-03-04 DIAGNOSIS — L97513 Non-pressure chronic ulcer of other part of right foot with necrosis of muscle: Secondary | ICD-10-CM | POA: Diagnosis not present

## 2022-03-04 LAB — GLUCOSE, CAPILLARY
Glucose-Capillary: 144 mg/dL — ABNORMAL HIGH (ref 70–99)
Glucose-Capillary: 217 mg/dL — ABNORMAL HIGH (ref 70–99)
Glucose-Capillary: 250 mg/dL — ABNORMAL HIGH (ref 70–99)
Glucose-Capillary: 229 mg/dL — ABNORMAL HIGH (ref 70–99)

## 2022-03-04 NOTE — Progress Notes (Signed)
Inpatient Rehabilitation Admissions Coordinator   I have notified Dr Frederick Peers of request for peer to peer by Fransico Him MD by 12 noon 5/20. Call to [phone] option #5 with his policy number of [id number] with DOB of 07/23/1956.  Ottie Glazier, RN, MSN Rehab Admissions Coordinator [phone] 03/04/2022 10:20 AM

## 2022-03-04 NOTE — Progress Notes (Addendum)
  Progress Note    03/04/2022 7:01 AM 5 Days Post-Op  Subjective:  sleeping but wakes and answers questions.  Says his foot feels a little bit better  afebrile  Vitals:   03/03/22 2339 03/04/22 0453  BP: 136/65 124/62  Pulse: 76 82  Resp: 16 18  Temp: 98.8 F (37.1 C) 98.2 F (36.8 C)  SpO2: 97% 98%    Physical Exam: General:  resting  Lungs:  non labored Incisions:  all incisions look fine and healing nicely Extremities:  brisk right PT doppler signal.  Right foot is wrapped with ace and is clean and intact.   CBC    Component Value Date/Time   WBC 9.2 03/02/2022 0157   RBC 3.70 (L) 03/02/2022 0157   HGB 10.3 (L) 03/02/2022 0157   HGB 14.1 11/17/2013 0621   HCT 30.9 (L) 03/02/2022 0157   HCT 41.0 02/16/2022 0608   PLT 226 03/02/2022 0157   PLT 218 11/17/2013 0621   MCV 83.5 03/02/2022 0157   MCV 86 11/17/2013 0621   MCH 27.8 03/02/2022 0157   MCHC 33.3 03/02/2022 0157   RDW 15.6 (H) 03/02/2022 0157   RDW 13.6 11/17/2013 0621   LYMPHSABS 2.2 02/16/2022 0530   LYMPHSABS 2.4 11/17/2013 0621   MONOABS 1.3 (H) 02/16/2022 0530   MONOABS 0.9 11/17/2013 0621   EOSABS 0.0 02/16/2022 0530   EOSABS 0.1 11/17/2013 0621   BASOSABS 0.0 02/16/2022 0530   BASOSABS 0.0 11/17/2013 0621    BMET    Component Value Date/Time   NA 140 03/02/2022 0157   NA 135 (L) 11/17/2013 0621   K 4.0 03/02/2022 0157   K 4.3 11/17/2013 0621   CL 109 03/02/2022 0157   CL 99 11/17/2013 0621   CO2 22 03/02/2022 0157   CO2 31 11/17/2013 0621   GLUCOSE 258 (H) 03/02/2022 0157   GLUCOSE 172 (H) 11/17/2013 0621   BUN 14 03/02/2022 0157   BUN 10 11/17/2013 0621   CREATININE 1.01 03/02/2022 0157   CREATININE 0.85 11/17/2013 0621   CALCIUM 8.5 (L) 03/02/2022 0157   CALCIUM 9.6 11/17/2013 0621   GFRNONAA >60 03/02/2022 0157   GFRNONAA >60 11/17/2013 0621   GFRAA >60 01/10/2020 0808   GFRAA >60 11/17/2013 0621    INR    Component Value Date/Time   INR 1.3 (H) 02/15/2022 2051      Intake/Output Summary (Last 24 hours) at 03/04/2022 0701 Last data filed at 03/03/2022 2342 Gross per 24 hour  Intake 360 ml  Output 1900 ml  Net -1540 ml     Assessment/Plan:  66 y.o. male is s/p:  right common femoral to TP trunk bypass with ipsilateral nonreversed great saphenous vein   7 Days Post-Op  And Partial fifth ray amputation RT foot by Dr. Logan Bores 5 Days Post-Op   -pt continues to have brisk right PT doppler signal -all incisions are healing nicely. -Cultures from the OR for toe amp were growing group B streptococcus yesterday and so ID narrowed his abx Unasyn and added back vancomycin since they were also growing rare Staphylococcus aureus species -DVT prophylaxis:  Eliquis   Doreatha Massed, PA-C [facility] [phone] 03/04/2022 7:01 AM   I have interviewed and examined patient with PA and agree with assessment and plan above.   Laylia Mui C. Randie Heinz, MD [facility] [place] Office: [phone] Pager: [phone]

## 2022-03-04 NOTE — Progress Notes (Signed)
Mobility Specialist Progress Note    03/04/22 1136  Mobility  Activity Transferred from bed to chair  Level of Assistance Contact guard assist, steadying assist  Assistive Device Other (Comment) (HHA)  RLE Weight Bearing PWB  Activity Response Tolerated well  $Mobility charge 1 Mobility   Pt received and agreeable. No complaints. Left with call bell in reach.    Hildred Alamin Mobility Specialist  Primary: 5N M.S. Phone: [phone] Secondary: 6N M.S. Phone: [phone]

## 2022-03-04 NOTE — Progress Notes (Signed)
Subjective: 5 Days Post-Op Procedure(s) (LRB): RIGHT PARTIAL AMPUTATION FOOT (Right) Partial fifth ray amputation RT foot.  DOS: 02/27/2022.  Patient resting comfortably on bed.  Dressings intact. States that he is no having any pain. Denies any fevers, chills.   Objective: Vitals:   03/04/22 1721 03/04/22 1823  BP: 135/81   Pulse: 63   Resp: 18 (!) 21  Temp:  97.7 F (36.5 C)  SpO2: 100%     Incision remains well coapted.  Staples and sutures intact.  No active bleeding or drainage.  Appears to be healing well at this time. No signs of dehiscence.   Assessment/Plan: 5 Days Post-Op Procedure(s) (LRB): RIGHT PARTIAL AMPUTATION FOOT (Right) DOS: 02/27/2022 -Dressings changed.   -ABX as per ID.  -BKA LLE.  Patient may continue to weight-bear minimally RLE for transition purposes only - Planned discharge to [place], DPM

## 2022-03-04 NOTE — Progress Notes (Signed)
Progress Note    Austin [name]   [id number]  DOB: 08-Oct-1955  DOA: 02/15/2022     16 PCP: Austin Burrow, MD  Initial CC: right foot wound, AMS  Hospital Course: Austin [name] is a 66 yo male with PMH CAD, DM II, HLD, HTN, PVD s/p left BKA who presented with weakness and altered mentation.  Work-up revealed diabetic foot ulcer involving his right foot.  He underwent evaluations with podiatry, ID, and vascular surgery. He underwent right iliac artery stent on 02/20/2022 followed by right common femoral artery to tibial peroneal trunk bypass with ipsilateral nonreversed great saphenous vein on 5/22. This was then followed by partial fifth ray amputation right foot on 02/27/2022. He has since been evaluated for going to CIR at discharge.  Interval History:  No events overnight.  Resting in bed comfortably this morning. Tried calling [facility] this morning but they are closed due to the holiday.  I will reattempt tomorrow for peer to peer.  Assessment and Plan: * Diabetic foot ulcer associated with type 2 diabetes mellitus ([place]) - s/p partial fifth ray amputation right foot on 02/27/2022 -Group B strep and now rare staph noted on wound culture from surgery; see OM -Weight-bear minimally RLE for transition purposes only per podiatry recommendations  Osteomyelitis of fifth toe of right foot ([place]) - s/p partial 5th ray amputation right foot 02/27/22 - ID following as well -Antibiotics de-escalated from Rocephin/Flagyl down to Unasyn on 03/02/2022 to cover GBS. Culture then updated on 5/28 to include rare Staph thus vanc added per ID; follow up Staph sens and further adjustment to be made at that time   Peripheral vascular disease ([place]) - s/p right iliac artery stent on 02/20/2022 followed by right common femoral artery to tibial peroneal trunk bypass with ipsilateral nonreversed great saphenous vein on 5/22. -Recommended for aspirin and Plavix for 1 month then aspirin -Follow-up  with vascular surgery in 2 to 3 weeks for incision checks  Acute metabolic encephalopathy-resolved as of 03/02/2022 - MRI brain with remote infarct.  Seen by neurology (5/14: MRI brain negative for acute stroke. No further inpatient neurologic workup indicated. 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 - Patient has prosthesis  Type 2 diabetes mellitus with diabetic peripheral angiopathy without gangrene ([place]) - A1c 11.3% on 02/17/2022 - Continue Semglee and NovoLog  Essential hypertension - Continue cozaar   Old records reviewed in assessment of this patient  Antimicrobials: Cefepime 5/13 >> 5/16 Rocephin 5/17 >>5/27 Flagyl 5/17 >> 5/27 Vanc 5/13 >> 5/26 Unasyn 03/02/2022 >> current  DVT prophylaxis:  SCD's Start: 02/25/22 1759 SCDs Start: 02/16/22 0311 apixaban (ELIQUIS) tablet 5 mg   Code Status:   Code Status: Full Code  Disposition Plan:  Possibly CIR Status is: Inpt  Objective: Blood pressure (!) 135/59, pulse 80, temperature 97.8 F (36.6 C), temperature source Oral, resp. rate 15, height 6\' 4"  (1.93 m), weight 98.6 kg, SpO2 97 %.  Examination:  Physical Exam Constitutional:      General: He is not in acute distress.    Appearance: Normal appearance.  HENT:     Head: Normocephalic and atraumatic.     Mouth/Throat:     Mouth: Mucous membranes are moist.  Eyes:  Extraocular Movements: Extraocular movements intact.  Cardiovascular:     Rate and Rhythm: Normal rate and regular rhythm.  Pulmonary:     Effort: Pulmonary effort is normal. No respiratory distress.     Breath sounds: Normal breath sounds. No wheezing.  Abdominal:     General: Bowel sounds are  normal. There is no distension.     Palpations: Abdomen is soft.     Tenderness: There is no abdominal tenderness.  Musculoskeletal:     Cervical back: Normal range of motion and neck supple.     Comments: Right lower extremity noted with 3 large incisions healing well and right foot in surgical dressing; left BKA appreciated with small chronic scab on anterior surface   Skin:    General: Skin is warm and dry.  Neurological:     General: No focal deficit present.     Mental Status: He is alert.  Psychiatric:        Mood and Affect: Mood normal.        Behavior: Behavior normal.     Consultants: ID Podiatry Vascular surgery  Procedures:  Right iliac artery stent on 02/20/2022 Right common femoral artery to tibial peroneal trunk bypass with ipsilateral nonreversed great saphenous vein on 5/22 Partial fifth ray amputation right foot on 02/27/2022  Data Reviewed: Results for orders placed or performed during the hospital encounter of 02/15/22 (from the past 24 hour(s))  Glucose, capillary     Status: Abnormal   Collection Time: 03/03/22  4:10 PM  Result Value Ref Range   Glucose-Capillary 230 (H) 70 - 99 mg/dL  Glucose, capillary     Status: Abnormal   Collection Time: 03/03/22  9:18 PM  Result Value Ref Range   Glucose-Capillary 217 (H) 70 - 99 mg/dL  Glucose, capillary     Status: Abnormal   Collection Time: 03/04/22  6:21 AM  Result Value Ref Range   Glucose-Capillary 144 (H) 70 - 99 mg/dL  Glucose, capillary     Status: Abnormal   Collection Time: 03/04/22 11:24 AM  Result Value Ref Range   Glucose-Capillary 217 (H) 70 - 99 mg/dL    I have Reviewed nursing notes, Vitals, and Lab results since pt's last encounter. Pertinent lab results: see above I have ordered test including BMP, CBC, Mg I have reviewed the last note from staff over past 24 hours I have discussed pt's care plan and test results with nursing staff, case manager   LOS: 16 days   Dwyane Dee,  MD [facility] 03/04/2022, 11:57 AM

## 2022-03-05 DIAGNOSIS — M869 Osteomyelitis, unspecified: Secondary | ICD-10-CM | POA: Diagnosis not present

## 2022-03-05 DIAGNOSIS — E11621 Type 2 diabetes mellitus with foot ulcer: Secondary | ICD-10-CM | POA: Diagnosis not present

## 2022-03-05 DIAGNOSIS — E1169 Type 2 diabetes mellitus with other specified complication: Secondary | ICD-10-CM | POA: Diagnosis not present

## 2022-03-05 DIAGNOSIS — L97513 Non-pressure chronic ulcer of other part of right foot with necrosis of muscle: Secondary | ICD-10-CM | POA: Diagnosis not present

## 2022-03-05 LAB — AEROBIC/ANAEROBIC CULTURE W GRAM STAIN (SURGICAL/DEEP WOUND): Gram Stain: NONE SEEN

## 2022-03-05 LAB — GLUCOSE, CAPILLARY
Glucose-Capillary: 171 mg/dL — ABNORMAL HIGH (ref 70–99)
Glucose-Capillary: 180 mg/dL — ABNORMAL HIGH (ref 70–99)
Glucose-Capillary: 250 mg/dL — ABNORMAL HIGH (ref 70–99)
Glucose-Capillary: 243 mg/dL — ABNORMAL HIGH (ref 70–99)

## 2022-03-05 LAB — CBC WITH DIFFERENTIAL/PLATELET
Abs Immature Granulocytes: 0.07 10*3/uL (ref 0.00–0.07)
Basophils Absolute: 0 10*3/uL (ref 0.0–0.1)
Basophils Relative: 0 %
Eosinophils Absolute: 0.1 10*3/uL (ref 0.0–0.5)
Eosinophils Relative: 1 %
HCT: 34.4 % — ABNORMAL LOW (ref 39.0–52.0)
Hemoglobin: 11.3 g/dL — ABNORMAL LOW (ref 13.0–17.0)
Immature Granulocytes: 1 %
Lymphocytes Relative: 22 %
Lymphs Abs: 2.7 10*3/uL (ref 0.7–4.0)
MCH: 27.6 pg (ref 26.0–34.0)
MCHC: 32.8 g/dL (ref 30.0–36.0)
MCV: 83.9 fL (ref 80.0–100.0)
Monocytes Absolute: 1 10*3/uL (ref 0.1–1.0)
Monocytes Relative: 8 %
Neutro Abs: 8.2 10*3/uL — ABNORMAL HIGH (ref 1.7–7.7)
Neutrophils Relative %: 68 %
Platelets: 319 10*3/uL (ref 150–400)
RBC: 4.1 MIL/uL — ABNORMAL LOW (ref 4.22–5.81)
RDW: 15.5 % (ref 11.5–15.5)
WBC: 12.1 10*3/uL — ABNORMAL HIGH (ref 4.0–10.5)
nRBC: 0 % (ref 0.0–0.2)

## 2022-03-05 LAB — BASIC METABOLIC PANEL
Anion gap: 6 (ref 5–15)
BUN: 10 mg/dL (ref 8–23)
CO2: 23 mmol/L (ref 22–32)
Calcium: 8.5 mg/dL — ABNORMAL LOW (ref 8.9–10.3)
Chloride: 108 mmol/L (ref 98–111)
Creatinine, Ser: 0.85 mg/dL (ref 0.61–1.24)
GFR, Estimated: >60 mL/min (ref >60)
Glucose, Bld: 189 mg/dL — ABNORMAL HIGH (ref 70–99)
Potassium: 3.9 mmol/L (ref 3.5–5.1)
Sodium: 137 mmol/L (ref 135–145)

## 2022-03-05 LAB — MAGNESIUM: Magnesium: 1.9 mg/dL (ref 1.7–2.4)

## 2022-03-05 NOTE — Progress Notes (Signed)
Mobility Specialist Progress Note    03/05/22 1653  Mobility  Activity Transferred from chair to bed  Level of Assistance Moderate assist, patient does 50-74%  Assistive Device Other (Comment) (HHA)  Activity Response Tolerated well  $Mobility charge 1 Mobility   Pt received and agreeable. C/o 8/10 RLE pain. Left with call bell in reach and RN present.   [place] Mobility Specialist  Primary: 5N M.S. Phone: [phone] Secondary: 6N M.S. Phone: [phone]

## 2022-03-05 NOTE — Progress Notes (Signed)
Inpatient Rehab Admissions Coordinator:   Met with pt to update that we have received and initial denial from [facility].  He would like to appeal with health plan and I will start this process today.  Can take up to 72 hours.  Will follow.   Shann Medal, PT, DPT Admissions Coordinator [phone] 03/05/22  3:57 PM

## 2022-03-05 NOTE — Progress Notes (Signed)
Progress Note    Austin [name]   [id number]  DOB: 09/23/1956  DOA: 02/15/2022     17 PCP: Austin Burrow, MD  Initial CC: right foot wound, AMS  Hospital Course: Austin [name] is a 66 yo male with PMH CAD, DM II, HLD, HTN, PVD s/p left BKA who presented with weakness and altered mentation.  Work-up revealed diabetic foot ulcer involving his right foot.  He underwent evaluations with podiatry, ID, and vascular surgery. He underwent right iliac artery stent on 02/20/2022 followed by right common femoral artery to tibial peroneal trunk bypass with ipsilateral nonreversed great saphenous vein on 5/22. This was then followed by partial fifth ray amputation right foot on 02/27/2022. He has since been evaluated for going to CIR at discharge.  Interval History:  No events overnight.  Called [facility] today for P2P.  Comfortably resting in bed; remains stable for discharge once dispo found.   Assessment and Plan: * Diabetic foot ulcer associated with type 2 diabetes mellitus ([place]) - s/p partial fifth ray amputation right foot on 02/27/2022 -Group B strep and now rare staph ([place]) noted on wound culture from surgery; see OM -Weight-bear minimally RLE for transition purposes only per podiatry recommendations  Osteomyelitis of fifth toe of right foot ([place]) - s/p partial 5th ray amputation right foot 02/27/22 - ID following as well -Antibiotics de-escalated from Rocephin/Flagyl down to Unasyn on 03/02/2022 to cover GBS. Culture then updated on 5/28 to include rare Staph thus vanc added per ID; now has speciated to MSSA so vanc d'c once again - continue monotherapy unasyn with plans to transition to Augmentin at discharge to complete course on 03/27/22 per ID  Peripheral vascular disease ([place]) - s/p right iliac artery stent on 02/20/2022 followed by right common femoral artery to tibial peroneal trunk bypass with ipsilateral nonreversed great saphenous vein on 5/22. -Recommended for  aspirin and Plavix for 1 month then aspirin -Follow-up with vascular surgery in 2 to 3 weeks for incision checks  Acute metabolic encephalopathy-resolved as of 03/02/2022 - MRI brain with remote infarct.  Seen by neurology (5/14: MRI brain negative for acute stroke. No further inpatient neurologic workup indicated. 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 - Patient has prosthesis  Type 2 diabetes mellitus with diabetic peripheral angiopathy without gangrene ([place]) - A1c 11.3% on 02/17/2022 - Continue Semglee and NovoLog  Essential hypertension - Continue cozaar   Old records reviewed in assessment of this patient  Antimicrobials: Cefepime 5/13 >> 5/16 Rocephin 5/17 >>5/27 Flagyl 5/17 >> 5/27 Vanc 5/13 >> 5/26 Vanc 5/28 >> 5/30 Unasyn 03/02/2022 >> current  DVT prophylaxis:  SCD's Start: 02/25/22 1759 SCDs Start: 02/16/22 0311 apixaban (ELIQUIS) tablet 5 mg   Code Status:   Code Status: Full Code  Disposition Plan:  Pending, possibly SNF now Status is: Inpt  Objective: Blood pressure 112/77, pulse 70, temperature (!) 97.5 F (36.4 C), temperature source Oral, resp. rate 18, height 6\' 4"  (1.93 m), weight 98.6 kg, SpO2 100 %.  Examination:  Physical Exam Constitutional:      General: He is not in acute distress.    Appearance: Normal appearance.  HENT:     Head: Normocephalic and atraumatic.     Mouth/Throat:  Mouth: Mucous membranes are moist.  Eyes:     Extraocular Movements: Extraocular movements intact.  Cardiovascular:     Rate and Rhythm: Normal rate and regular rhythm.  Pulmonary:     Effort: Pulmonary effort is normal. No respiratory distress.     Breath  sounds: Normal breath sounds. No wheezing.  Abdominal:     General: Bowel sounds are normal. There is no distension.     Palpations: Abdomen is soft.     Tenderness: There is no abdominal tenderness.  Musculoskeletal:     Cervical back: Normal range of motion and neck supple.     Comments: Right lower extremity noted with 3 large incisions healing well and right foot in surgical dressing; left BKA appreciated with small chronic scab on anterior surface   Skin:    General: Skin is warm and dry.  Neurological:     General: No focal deficit present.     Mental Status: He is alert.  Psychiatric:        Mood and Affect: Mood normal.        Behavior: Behavior normal.     Consultants: ID Podiatry Vascular surgery  Procedures:  Right iliac artery stent on 02/20/2022 Right common femoral artery to tibial peroneal trunk bypass with ipsilateral nonreversed great saphenous vein on 5/22 Partial fifth ray amputation right foot on 02/27/2022  Data Reviewed: Results for orders placed or performed during the hospital encounter of 02/15/22 (from the past 24 hour(s))  Glucose, capillary     Status: Abnormal   Collection Time: 03/04/22  5:39 PM  Result Value Ref Range   Glucose-Capillary 250 (H) 70 - 99 mg/dL  Glucose, capillary     Status: Abnormal   Collection Time: 03/04/22  9:15 PM  Result Value Ref Range   Glucose-Capillary 229 (H) 70 - 99 mg/dL   Comment 1 Notify RN    Comment 2 Document in Chart   Basic metabolic panel     Status: Abnormal   Collection Time: 03/05/22  2:07 AM  Result Value Ref Range   Sodium 137 135 - 145 mmol/L   Potassium 3.9 3.5 - 5.1 mmol/L   Chloride 108 98 - 111 mmol/L   CO2 23 22 - 32 mmol/L   Glucose, Bld 189 (H) 70 - 99 mg/dL   BUN 10 8 - 23 mg/dL   Creatinine, Ser 1.610.85 0.61 - 1.24 mg/dL   Calcium 8.5 (L) 8.9 - 10.3 mg/dL   GFR, Estimated >09>60 >60>60 mL/min   Anion gap 6 5 - 15  CBC with Differential/Platelet     Status: Abnormal   Collection Time:  03/05/22  2:07 AM  Result Value Ref Range   WBC 12.1 (H) 4.0 - 10.5 K/uL   RBC 4.10 (L) 4.22 - 5.81 MIL/uL   Hemoglobin 11.3 (L) 13.0 - 17.0 g/dL   HCT 45.434.4 (L) 09.839.0 - 11.952.0 %   MCV 83.9 80.0 - 100.0 fL   MCH 27.6 26.0 - 34.0 pg   MCHC 32.8 30.0 - 36.0 g/dL   RDW 14.715.5 82.911.5 - 56.215.5 %   Platelets 319 150 - 400 K/uL   nRBC 0.0 0.0 - 0.2 %   Neutrophils Relative % 68 %   Neutro Abs 8.2 (H) 1.7 - 7.7 K/uL   Lymphocytes Relative 22 %   Lymphs Abs 2.7 0.7 - 4.0 K/uL   Monocytes Relative 8 %   Monocytes Absolute 1.0 0.1 - 1.0 K/uL   Eosinophils Relative 1 %  Eosinophils Absolute 0.1 0.0 - 0.5 K/uL   Basophils Relative 0 %   Basophils Absolute 0.0 0.0 - 0.1 K/uL   Immature Granulocytes 1 %   Abs Immature Granulocytes 0.07 0.00 - 0.07 K/uL  Magnesium     Status: None   Collection Time: 03/05/22  2:07 AM  Result Value Ref Range   Magnesium 1.9 1.7 - 2.4 mg/dL  Glucose, capillary     Status: Abnormal   Collection Time: 03/05/22  6:09 AM  Result Value Ref Range   Glucose-Capillary 171 (H) 70 - 99 mg/dL   Comment 1 Notify RN    Comment 2 Document in Chart   Glucose, capillary     Status: Abnormal   Collection Time: 03/05/22 11:36 AM  Result Value Ref Range   Glucose-Capillary 180 (H) 70 - 99 mg/dL    I have Reviewed nursing notes, Vitals, and Lab results since pt's last encounter. Pertinent lab results: see above I have ordered test including BMP, CBC, Mg I have reviewed the last note from staff over past 24 hours I have discussed pt's care plan and test results with nursing staff, case manager   LOS: 17 days   Dwyane Dee, MD [facility] 03/05/2022, 2:54 PM

## 2022-03-05 NOTE — Progress Notes (Signed)
  Progress Note    03/05/2022 7:24 AM 6 Days Post-Op  Subjective:  no complaints   Vitals:   03/04/22 2309 03/05/22 0412  BP: 130/80 111/66  Pulse: 81 78  Resp: 20 15  Temp: 97.6 F (36.4 C) (!) 97.3 F (36.3 C)  SpO2: 98% 100%   Physical Exam: Cardiac:  recular Lungs:  non labored Incisions:  Right groin and right leg incisions are all intact and well appearing. No bleeding or drainage Extremities:  RLE well perfused and warm Doppler Pero signal. Palpable pulse in bypass graft Abdomen:  soft Neurologic: alert and oriented  CBC    Component Value Date/Time   WBC 12.1 (H) 03/05/2022 0207   RBC 4.10 (L) 03/05/2022 0207   HGB 11.3 (L) 03/05/2022 0207   HGB 14.1 11/17/2013 0621   HCT 34.4 (L) 03/05/2022 0207   HCT 41.0 02/16/2022 0608   PLT 319 03/05/2022 0207   PLT 218 11/17/2013 0621   MCV 83.9 03/05/2022 0207   MCV 86 11/17/2013 0621   MCH 27.6 03/05/2022 0207   MCHC 32.8 03/05/2022 0207   RDW 15.5 03/05/2022 0207   RDW 13.6 11/17/2013 0621   LYMPHSABS 2.7 03/05/2022 0207   LYMPHSABS 2.4 11/17/2013 0621   MONOABS 1.0 03/05/2022 0207   MONOABS 0.9 11/17/2013 0621   EOSABS 0.1 03/05/2022 0207   EOSABS 0.1 11/17/2013 0621   BASOSABS 0.0 03/05/2022 0207   BASOSABS 0.0 11/17/2013 0621    BMET    Component Value Date/Time   NA 137 03/05/2022 0207   NA 135 (L) 11/17/2013 0621   K 3.9 03/05/2022 0207   K 4.3 11/17/2013 0621   CL 108 03/05/2022 0207   CL 99 11/17/2013 0621   CO2 23 03/05/2022 0207   CO2 31 11/17/2013 0621   GLUCOSE 189 (H) 03/05/2022 0207   GLUCOSE 172 (H) 11/17/2013 0621   BUN 10 03/05/2022 0207   BUN 10 11/17/2013 0621   CREATININE 0.85 03/05/2022 0207   CREATININE 0.85 11/17/2013 0621   CALCIUM 8.5 (L) 03/05/2022 0207   CALCIUM 9.6 11/17/2013 0621   GFRNONAA >60 03/05/2022 0207   GFRNONAA >60 11/17/2013 0621   GFRAA >60 01/10/2020 0808   GFRAA >60 11/17/2013 0621    INR    Component Value Date/Time   INR 1.3 (H) 02/15/2022  2051     Intake/Output Summary (Last 24 hours) at 03/05/2022 0724 Last data filed at 03/05/2022 0448 Gross per 24 hour  Intake 1796.33 ml  Output 3401 ml  Net -1604.67 ml     Assessment/Plan:  66 y.o. male is s/p Right common femoral to TPT bypass with vein 8 Days Post Op and partial 5th ray amputation of right foot by Dr. Amalia Hailey 6 Days Post-Op   Says pain overall well controlled RLE well perfused with Pero doppler signal. Palpable pulse in RLE bypass graft Incisions are all intact and well appearing On Unasyn and Vanc Continue Therapies Has outpatient follow up arranged on 03/26/22 for incision check  DVT prophylaxis:  Eliquis   Karoline Caldwell, PA-C [facility] [phone] 03/05/2022 7:24 AM

## 2022-03-05 NOTE — Progress Notes (Signed)
Subjective: 6 Days Post-Op Procedure(s) (LRB): RIGHT PARTIAL AMPUTATION FOOT (Right) Partial fifth ray amputation RT foot.  DOS: 02/27/2022.  Patient resting comfortably on bed.  Dressings intact. State he is feeling   Objective: Vitals:   03/05/22 1627 03/05/22 1953  BP: (!) 124/56 111/65  Pulse: 82 69  Resp: 16 18  Temp:  98 F (36.7 C)  SpO2: 96% 100%     Dressing clean, dry, intact without any strikethrough  Assessment/Plan: 6 Days Post-Op Procedure(s) (LRB): RIGHT PARTIAL AMPUTATION FOOT (Right) DOS: 02/27/2022 -White blood cell count elevated today.  Will recheck tomorrow.  Plan for dressing change tomorrow. -ABX as per ID.  -BKA LLE.  Patient may continue to weight-bear minimally RLE for transition purposes only - Planned discharge to [place], DPM

## 2022-03-05 NOTE — Progress Notes (Signed)
Physical Therapy Treatment Patient Details Name: Austin [name] MRN: [id number] DOB: [birth year] Today's Date: 03/05/2022   History of Present Illness Pt is a 66 y.o. 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 Brain MRI negative for acute injury. S/p RLE arteriogram and angioplasty of external iliac artery on 5/17. S/p RLE tibial bypass graft on 5/22. 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 PMH includes PVD s/p L BKA (10/2018) with prosthetic, DM, DVT, HTN, HLD.   PT Comments    Pt progressing with mobility. Today's session focused on transfer training with RW vs Stedy; pt with heavy reliance on BUE support of Stedy to pull/push into standing with modA. Pt demonstrates improved RLE AROM, still lacking full knee extension. Pt motivated to participate, remains an excellent candidate for intensive CIR-level therapies to maximize functional mobility and independence prior to return home.    Recommendations for follow up therapy are one component of a multi-disciplinary discharge planning process, led by the attending physician.  Recommendations may be updated based on patient status, additional functional criteria and insurance authorization.  Follow Up Recommendations  Acute inpatient rehab (3hours/day)     Assistance Recommended at Discharge Intermittent Supervision/Assistance  Patient can return home with the following Two people to help with walking and/or transfers;A lot of help with bathing/dressing/bathroom;Assistance with cooking/housework;Assist for transportation;Help with stairs or ramp for entrance   Equipment Recommendations   (TBD- lift equipment if home)    Recommendations for Other Services       Precautions / Restrictions Precautions Precautions: Fall;Other (comment) Precaution Comments: H/o L BKA (prosthetic in room) Required Braces or Orthoses: Other Brace Other Brace: RLE darco shoe (in  room) Restrictions Weight Bearing Restrictions: Yes RLE Weight Bearing: Partial weight bearing Other Position/Activity Restrictions: WB through R heel for transfers only per podiatry     Mobility  Bed Mobility               General bed mobility comments: received sitting in recliner    Transfers Overall transfer level: Needs assistance Equipment used: Rolling walker (2 wheels), Ambulation equipment used Transfers: Sit to/from Stand             General transfer comment: 3x attempt to stand from low recliner height to RW, pt able to fully offload buttocks, but difficulty transitioning UE support from armrests to RW to achieve fully upright standing, ultimately unsuccessful; additional sit<>stands from recliner in stedy, modA for trunk elevaiton, heavy reliance on BUE support to pull to stand then push to extend trunk; maxA for eccentric lowering to recliner    Ambulation/Gait                   Stairs             Wheelchair Mobility    Modified Rankin (Stroke Patients Only)       Balance Overall balance assessment: Needs assistance Sitting-balance support: Single extremity supported, No upper extremity supported Sitting balance-Leahy Scale: Fair Sitting balance - Comments: able to don L BKA prosthetic with assist for set-up; requires assist to don R darco shoe   Standing balance support: Bilateral upper extremity supported, Reliant on assistive device for balance Standing balance-Leahy Scale: Poor Standing balance comment: can maintain static standing in stedy frame with heavy reliance on BUE support  Cognition Arousal/Alertness: Awake/alert Behavior During Therapy: WFL for tasks assessed/performed Overall Cognitive Status: No family/caregiver present to determine baseline cognitive functioning Area of Impairment: Attention, Safety/judgement, Awareness, Problem solving, Following commands                    Current Attention Level: Selective   Following Commands: Follows multi-step commands with increased time Safety/Judgement: Decreased awareness of safety, Decreased awareness of deficits Awareness: Emergent Problem Solving: Slow processing, Requires verbal cues General Comments: Decreased awareness of deficits, safety and decreased activity tolerance. Pt expressed motivation for CIR Rehab and awaiting to hear back on approval        Exercises Other Exercises Other Exercises: seated LAQ with heel slide on floor to maximize knee flex; RLE positioned resting to encourage full knee ext (slight flex contracture) and hip IR    General Comments        Pertinent Vitals/Pain Pain Assessment Pain Assessment: No/denies pain Pain Intervention(s): Monitored during session, Repositioned    Home Living                          Prior Function            PT Goals (current goals can now be found in the care plan section) Acute Rehab PT Goals Patient Stated Goal: To return home and be independent in getting around and caring for myself. PT Goal Formulation: With patient Time For Goal Achievement: 03/19/22 Potential to Achieve Goals: Good Progress towards PT goals: Progressing toward goals    Frequency    Min 3X/week      PT Plan Current plan remains appropriate    Co-evaluation              AM-PAC PT "6 Clicks" Mobility   Outcome Measure  Help needed turning from your back to your side while in a flat bed without using bedrails?: A Little Help needed moving from lying on your back to sitting on the side of a flat bed without using bedrails?: A Little Help needed moving to and from a bed to a chair (including a wheelchair)?: Total Help needed standing up from a chair using your arms (e.g., wheelchair or bedside chair)?: Total Help needed to walk in hospital room?: Total Help needed climbing 3-5 steps with a railing? : Total 6 Click Score: 10    End of  Session Equipment Utilized During Treatment: Gait belt Activity Tolerance: Patient tolerated treatment well Patient left: in chair;with call bell/phone within reach;with chair alarm set;with nursing/sitter in room Nurse Communication: Mobility status;Need for lift equipment PT Visit Diagnosis: Other abnormalities of gait and mobility (R26.89);Pain     Time: [id number] PT Time Calculation (min) (ACUTE ONLY): 24 min  Charges:  $Therapeutic Activity: 23-37 mins                     Mabeline Caras, PT, DPT Acute Rehabilitation Services  Pager [phone] Office [place] 03/05/2022, 2:43 PM

## 2022-03-05 NOTE — Progress Notes (Signed)
Mobility Specialist Progress Note    03/05/22 1117  Mobility  Activity Transferred from bed to chair  Level of Assistance Contact guard assist, steadying assist  Assistive Device Other (Comment) (HHA)  RLE Weight Bearing PWB  Activity Response Tolerated well  $Mobility charge 1 Mobility   Left with call bell in reach.   Hildred Alamin Mobility Specialist  Primary: 5N M.S. Phone: [phone] Secondary: 6N M.S. Phone: [phone]

## 2022-03-05 NOTE — Progress Notes (Signed)
Occupational Therapy Evaluation Patient Details Name: Austin [name] MRN: [id number] DOB: 1955-12-16 Today's Date: 03/05/2022   History of Present Illness Pt is a 66 y.o. 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 Brain MRI negative for acute injury. S/p RLE arteriogram and angioplasty of external iliac artery on 5/17. S/p RLE tibial bypass graft on 5/22. 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 PMH includes PVD s/p L BKA (10/2018) with prosthetic, DM, DVT, HTN, HLD.   Clinical Impression   Pt was seen for OT ADL retraining session today with focus on grooming and bathing at bed level as pt was using bed pan. He politely declined transfer to 3:1 or chair at this time but was agreeable to bathing/dressing/grooming. Discussed importance of functional transfers to assist in increasing activity tolerance/endurance. Pt verbalized understanding but reports frustration w/ difficulty having BM of late & wanting to stay on bed pan. Pt educated verbally in role of OT and that it may be easier to have BM if sitting up on 3:1. Will continue to folow acutely and progress to increased transfers for increased independence with ADL's. Pt remains appropriate for CIR level and is motivated.     Recommendations for follow up therapy are one component of a multi-disciplinary discharge planning process, led by the attending physician.  Recommendations may be updated based on patient status, additional functional criteria and insurance authorization.   Follow Up Recommendations  Acute inpatient rehab (3hours/day)    Assistance Recommended at Discharge Frequent or constant Supervision/Assistance  Patient can return home with the following A lot of help with walking and/or transfers;A lot of help with bathing/dressing/bathroom;Assistance with cooking/housework    Functional Status Assessment     Equipment Recommendations  Other (comment)  (Defer to next venue)    Recommendations for Other Services       Precautions / Restrictions Precautions Precautions: Fall;Other (comment) Precaution Comments: H/o L BKA (prosthetic in room) Required Braces or Orthoses: Other Brace Other Brace: RLE darco shoe (in room) Restrictions Weight Bearing Restrictions: Yes Other Position/Activity Restrictions: WB through R heel for transfers only per podiatry      Mobility Bed Mobility Overal bed mobility: Needs Assistance Bed Mobility: Supine to Sit Rolling: Min guard   Supine to sit: Min guard, Supervision, HOB elevated     General bed mobility comments: Pt using bed rails to sit up in bed during bathing this am    Transfers Overall transfer level:  (Pt declined secondary to using bed pan and needing to have BM)          ADL either performed or assessed with clinical judgement   ADL Overall ADL's : Needs assistance/impaired Eating/Feeding: Modified independent;Bed level   Grooming: Wash/dry hands;Wash/dry face;Oral care;Applying deodorant;Brushing hair;Set up;Bed level Grooming Details (indicate cue type and reason): HOB elevated Upper Body Bathing: Set up;Bed level Upper Body Bathing Details (indicate cue type and reason): HOB elevated Lower Body Bathing: Moderate assistance;Sitting/lateral leans;Bed level Lower Body Bathing Details (indicate cue type and reason): To wash below knee level Upper Body Dressing : Minimal assistance;Bed level Upper Body Dressing Details (indicate cue type and reason): Sitting up in bed to don gown     Toilet Transfer:  (Pt declined as he is currently using bed pan. Discussed importance of functional transfers to assist in increasing activity tolerance/endurance. Pt verbalized understanding/reports frustration w/ difficulty having BM of late & wanting to stay on bedpan.)  General ADL Comments: Pt was seen for OT ADL retraining session today with focus on grooming and bathing at bed level  as pt was using bed pan. He politely declined transfer to 3:1 or chair at ths time but was agreeable to bathing/dressing/grooming. Discussed importance of functional transfers to assist in increasing activity tolerance/endurance. Pt verbalized understanding but reports frustration w/ difficulty having BM of late & wanting to stay on bed pan. Pt educated verbally in role of OT and that it may be easier to have BM if sitting up on 3:1. Will continue to folow acutely and progress to increased transfers for increased independence with ADL's.     Vision Ability to See in Adequate Light: 0 Adequate Patient Visual Report: No change from baseline              Pertinent Vitals/Pain Pain Assessment Pain Assessment: No/denies pain     Hand Dominance Right   Extremity/Trunk Assessment Upper Extremity Assessment Upper Extremity Assessment: Generalized weakness;Overall [facility] for tasks assessed   Lower Extremity Assessment Lower Extremity Assessment: Defer to PT evaluation       Communication Communication Communication: No difficulties   Cognition Arousal/Alertness: Awake/alert Behavior During Therapy: WFL for tasks assessed/performed   Following Commands: Follows multi-step commands with increased time Safety/Judgement: Decreased awareness of safety, Decreased awareness of deficits Awareness: Emergent Problem Solving: Slow processing, Requires verbal cues General Comments: Decreased awareness of deficits, safety and decreased activity tolerance. Pt expressed motivation for CIR Rehab and awaiting to hear back on approval                Home Living Family/patient expects to be discharged to:: Private residence Living Arrangements: Non-relatives/Friends Available Help at Discharge: Family;Friend(s);Available 24 hours/day Type of Home: House Home Access: Level entry     Home Layout: One level     Bathroom Shower/Tub: Tub/shower unit (takes sponge baths at sink)   Bathroom Toilet:  Handicapped height Bathroom Accessibility: Yes How Accessible: Accessible via wheelchair;Accessible via walker Home Equipment: [place] (2 wheels);Wheelchair - manual;Tub bench   Additional Comments: lives with friend; will also have assist from brother and sister at d/c      Prior Functioning/Environment Prior Level of Function : Independent/Modified Independent     Mobility Comments: Mod indep ambulating with LLE prosthetic and RW, typically uses manual w/c ADLs Comments: Does bird baths at sink from w/c        OT Problem List: Decreased activity tolerance;Decreased knowledge of use of DME or AE;Decreased knowledge of precautions      OT Treatment/Interventions: Self-care/ADL training;DME and/or AE instruction;Therapeutic activities;Patient/family education    OT Goals(Current goals can be found in the care plan section) Acute Rehab OT Goals Patient Stated Goal: Pt is hopeful to go to Rehab and increase his independence OT Goal Formulation: With patient Time For Goal Achievement: 03/07/22 Potential to Achieve Goals: Good  OT Frequency: Min 3X/week       AM-PAC OT "6 Clicks" Daily Activity     Outcome Measure Help from another person eating meals?: None Help from another person taking care of personal grooming?: A Little Help from another person toileting, which includes using toliet, bedpan, or urinal?: A Lot Help from another person bathing (including washing, rinsing, drying)?: A Lot Help from another person to put on and taking off regular upper body clothing?: A Little Help from another person to put on and taking off regular lower body clothing?: A Lot 6 Click Score: 16   End of Session  Activity Tolerance: Patient tolerated treatment well;Other (comment) (Pt limited by need to have BM per his report and sitting on bed pan) Patient left: in bed;with call bell/phone within reach  OT Visit Diagnosis: Unsteadiness on feet (R26.81)                Time:  [id number] OT Time Calculation (min): 19 min Charges:  OT General Charges $OT Visit: 1 Visit OT Treatments $Self Care/Home Management : 8-22 mins [place], [place], OTR/L 03/05/2022, 10:02 AM

## 2022-03-05 NOTE — TOC Progression Note (Signed)
Transition of Care [facility]) - Progression Note    Patient Details  Name: Austin [name] MRN: [id number] Date of Birth: 11/12/1955  Transition of Care [facility]) CM/SW [place], RN Phone Number: 03/05/2022, 8:48 AM  Clinical Narrative:    Patient was declined by insurance for CIR, peer to peer done and denied. Plan to appeal decision.   Expected Discharge Plan: [place] Barriers to Discharge: Continued Medical Work up  Expected Discharge Plan and Services Expected Discharge Plan: [place] arrangements for the past 2 months: Mobile Home                                       Social Determinants of Health (SDOH) Interventions    Readmission Risk Interventions     View : No data to display.

## 2022-03-05 NOTE — Progress Notes (Signed)
[facility]  Date of Admission:  02/15/2022           Reason for visit: Follow up on DFU/OM   Current antibiotics: Vancomycin Unasyn    ASSESSMENT:    66 y.o. male admitted with:  Diabetic foot ulcer with OM of the right 5th metatarsal: Status post OR with podiatry on 5/24 for partial 5th ray amputation. Pathology showed osteomyelitis. Cultures finalized with GBS and MSSA. Peripheral artery disease: Status post prior left BKA.  Underwent right femoral to TP trunk bypass with reversed GSV on 5/22 with vascular surgery to optimize blood flow prior to procedure for problem #1. Uncontrolled DM: Hgb A1c at admission is 11.3.  RECOMMENDATIONS:    Continue Unasyn Stop Vancomycin Wound care, glycemic control Will plan for 4 weeks of therapy from date of amputation to do mop up therapy as difficult to discern if all osteomyelitic bone removed at time of partial 5th ray amputation.  Continue Unasyn while he remains inpatient and transition to Augmentin at discharge.  End date for antibiotics is 03/27/22 Will arrange follow up and sign off.  Please call as needed   Principal Problem:   Diabetic foot ulcer associated with type 2 diabetes mellitus (HCC) Active Problems:   Essential hypertension   Type 2 diabetes mellitus with diabetic peripheral angiopathy without gangrene (HCC)   Status post below-knee amputation (HCC) - left side   Peripheral vascular disease (HCC)   Atrial fibrillation, chronic (HCC)   Osteomyelitis of fifth toe of right foot (HCC)    MEDICATIONS:    Scheduled Meds:  apixaban  5 mg Oral BID   aspirin EC  81 mg Oral Daily   atorvastatin  80 mg Oral q1800   clopidogrel  75 mg Oral Q0600   insulin aspart  0-15 Units Subcutaneous TID WC   insulin aspart  0-5 Units Subcutaneous QHS   insulin aspart  10 Units Subcutaneous TID WC   insulin glargine-yfgn  30 Units Subcutaneous BID   losartan  25 mg Oral Daily   pantoprazole  40 mg  Oral Daily   polyethylene glycol  17 g Oral Daily   sodium chloride flush  3 mL Intravenous Once   Continuous Infusions:  sodium chloride     ampicillin-sulbactam (UNASYN) IV Stopped (03/05/22 0728)   PRN Meds:.sodium chloride, acetaminophen **OR** acetaminophen, alum & mag hydroxide-simeth, guaiFENesin, hydrALAZINE, ipratropium-albuterol, labetalol, metoprolol tartrate, morphine injection, ondansetron **OR** ondansetron (ZOFRAN) IV, oxyCODONE, phenol, senna-docusate, sodium chloride, traZODone  SUBJECTIVE:   24 hour events:  No events   Feeling good.  Pain controlled. Hoping to go to rehab.  Review of Systems  All other systems reviewed and are negative.    OBJECTIVE:   Blood pressure 114/66, pulse 73, temperature 98.3 F (36.8 C), temperature source Oral, resp. rate 15, height 6\' 4"  (1.93 m), weight 98.6 kg, SpO2 97 %. Body mass index is 26.46 kg/m.  Physical Exam Constitutional:      General: He is not in acute distress.    Appearance: Normal appearance.  HENT:     Head: Normocephalic and atraumatic.  Eyes:     Extraocular Movements: Extraocular movements intact.     Conjunctiva/sclera: Conjunctivae normal.  Pulmonary:     Effort: Pulmonary effort is normal. No respiratory distress.  Abdominal:     General: There is no distension.     Palpations: Abdomen is soft.  Musculoskeletal:     Cervical back: Normal range of motion and  neck supple.     Comments: Right foot wrapped in Ace bandage.   Skin:    General: Skin is warm and dry.     Findings: No rash.  Neurological:     General: No focal deficit present.     Mental Status: He is alert and oriented to person, place, and time.  Psychiatric:        Mood and Affect: Mood normal.        Behavior: Behavior normal.     Lab Results: Lab Results  Component Value Date   WBC 12.1 (H) 03/05/2022   HGB 11.3 (L) 03/05/2022   HCT 34.4 (L) 03/05/2022   MCV 83.9 03/05/2022   PLT 319 03/05/2022    Lab Results   Component Value Date   NA 137 03/05/2022   K 3.9 03/05/2022   CO2 23 03/05/2022   GLUCOSE 189 (H) 03/05/2022   BUN 10 03/05/2022   CREATININE 0.85 03/05/2022   CALCIUM 8.5 (L) 03/05/2022   GFRNONAA >60 03/05/2022   GFRAA >60 01/10/2020    Lab Results  Component Value Date   ALT 21 02/28/2022   AST 17 02/28/2022   ALKPHOS 47 02/28/2022   BILITOT 0.5 02/28/2022    No results found for: CRP     Component Value Date/Time   ESRSEDRATE 3 03/30/2021 0958   ESRSEDRATE 36 (H) 11/16/2013 0514     I have reviewed the micro and lab results in Epic.  Imaging: No results found.   Imaging independently reviewed in Epic.    Vedia Coffer for Infectious Disease [facility] [phone] pager 03/05/2022, 10:37 AM

## 2022-03-06 DIAGNOSIS — E11621 Type 2 diabetes mellitus with foot ulcer: Secondary | ICD-10-CM | POA: Diagnosis not present

## 2022-03-06 DIAGNOSIS — L97513 Non-pressure chronic ulcer of other part of right foot with necrosis of muscle: Secondary | ICD-10-CM | POA: Diagnosis not present

## 2022-03-06 LAB — MAGNESIUM: Magnesium: 1.9 mg/dL (ref 1.7–2.4)

## 2022-03-06 LAB — CBC WITH DIFFERENTIAL/PLATELET
Abs Immature Granulocytes: 0.07 10*3/uL (ref 0.00–0.07)
Basophils Absolute: 0 10*3/uL (ref 0.0–0.1)
Basophils Relative: 0 %
Eosinophils Absolute: 0.1 10*3/uL (ref 0.0–0.5)
Eosinophils Relative: 1 %
HCT: 33.9 % — ABNORMAL LOW (ref 39.0–52.0)
Hemoglobin: 11.2 g/dL — ABNORMAL LOW (ref 13.0–17.0)
Immature Granulocytes: 1 %
Lymphocytes Relative: 28 %
Lymphs Abs: 2.6 10*3/uL (ref 0.7–4.0)
MCH: 27.7 pg (ref 26.0–34.0)
MCHC: 33 g/dL (ref 30.0–36.0)
MCV: 83.9 fL (ref 80.0–100.0)
Monocytes Absolute: 0.9 10*3/uL (ref 0.1–1.0)
Monocytes Relative: 10 %
Neutro Abs: 5.6 10*3/uL (ref 1.7–7.7)
Neutrophils Relative %: 60 %
Platelets: 308 10*3/uL (ref 150–400)
RBC: 4.04 MIL/uL — ABNORMAL LOW (ref 4.22–5.81)
RDW: 15.5 % (ref 11.5–15.5)
WBC: 9.3 10*3/uL (ref 4.0–10.5)
nRBC: 0 % (ref 0.0–0.2)

## 2022-03-06 LAB — GLUCOSE, CAPILLARY
Glucose-Capillary: 205 mg/dL — ABNORMAL HIGH (ref 70–99)
Glucose-Capillary: 184 mg/dL — ABNORMAL HIGH (ref 70–99)
Glucose-Capillary: 221 mg/dL — ABNORMAL HIGH (ref 70–99)
Glucose-Capillary: 203 mg/dL — ABNORMAL HIGH (ref 70–99)

## 2022-03-06 LAB — BASIC METABOLIC PANEL
Anion gap: 4 — ABNORMAL LOW (ref 5–15)
BUN: 10 mg/dL (ref 8–23)
CO2: 25 mmol/L (ref 22–32)
Calcium: 8.7 mg/dL — ABNORMAL LOW (ref 8.9–10.3)
Chloride: 109 mmol/L (ref 98–111)
Creatinine, Ser: 0.82 mg/dL (ref 0.61–1.24)
GFR, Estimated: >60 mL/min (ref >60)
Glucose, Bld: 212 mg/dL — ABNORMAL HIGH (ref 70–99)
Potassium: 4 mmol/L (ref 3.5–5.1)
Sodium: 138 mmol/L (ref 135–145)

## 2022-03-06 NOTE — Progress Notes (Signed)
Occupational Therapy Treatment Patient Details Name: Austin [name] MRN: [id number] DOB: 08-21-1956 Today's Date: 03/06/2022   History of present illness Pt is a 66 y.o. 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 Brain MRI negative for acute injury. S/p RLE arteriogram and angioplasty of external iliac artery on 5/17. S/p RLE tibial bypass graft on 5/22. 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 PMH includes PVD s/p L BKA (10/2018) with prosthetic, DM, DVT, HTN, HLD.   OT comments  Patient received in supine and agreeable to OT/PT session. Patient was min assist to get to EOB due to assistance with trunk. Patient able to donn left prosthesis but required assistance with right orthotic shoe. Patient stood with mod assist +2 to RW and transferred to recliner with max assist +2 to maintain WB precautions. Patient performed bathing seated in recliner and stood inside stedy with mod assist +2 for peri area cleaning. Patient making good progress but continues to have difficulty maintaining WB precautions. Acute OT to continue to follow.    Recommendations for follow up therapy are one component of a multi-disciplinary discharge planning process, led by the attending physician.  Recommendations may be updated based on patient status, additional functional criteria and insurance authorization.    Follow Up Recommendations  Acute inpatient rehab (3hours/day)    Assistance Recommended at Discharge Frequent or constant Supervision/Assistance  Patient can return home with the following  A lot of help with walking and/or transfers;A lot of help with bathing/dressing/bathroom;Assistance with cooking/housework   Equipment Recommendations  Other (comment) (TBD)    Recommendations for Other Services      Precautions / Restrictions Precautions Precautions: Fall;Other (comment) Precaution Comments: H/o L BKA (prosthetic in  room) Required Braces or Orthoses: Other Brace Other Brace: RLE darco shoe (in room) Restrictions Weight Bearing Restrictions: Yes RLE Weight Bearing: Partial weight bearing Other Position/Activity Restrictions: WB through R heel for transfers only per podiatry       Mobility Bed Mobility Overal bed mobility: Needs Assistance Bed Mobility: Supine to Sit     Supine to sit: Min assist, HOB elevated     General bed mobility comments: required assistance raising trunk    Transfers Overall transfer level: Needs assistance Equipment used: Rolling walker (2 wheels), Ambulation equipment used Transfers: Sit to/from Stand Sit to Stand: Mod assist, +2 safety/equipment, +2 physical assistance Stand pivot transfers: Max assist, +2 physical assistance, +2 safety/equipment, From elevated surface         General transfer comment: stood from EOB with mod assist +2 and max assist +2 to transfer to  recliner due to WB precautions     Balance Overall balance assessment: Needs assistance Sitting-balance support: Single extremity supported, No upper extremity supported Sitting balance-Leahy Scale: Fair Sitting balance - Comments: able to don L BKA prosthetic with assist for set-up; requires assist to don R darco shoe   Standing balance support: Bilateral upper extremity supported, Reliant on assistive device for balance Standing balance-Leahy Scale: Poor Standing balance comment: stood in stedy with assistance of 2                           ADL either performed or assessed with clinical judgement   ADL Overall ADL's : Needs assistance/impaired     Grooming: Wash/dry hands;Wash/dry face;Supervision/safety;Sitting Grooming Details (indicate cue type and reason): in recliner Upper Body Bathing: Supervision/ safety;Sitting Upper Body  Bathing Details (indicate cue type and reason): in recliner Lower Body Bathing: Moderate assistance;Sit to/from stand Lower Body Bathing Details  (indicate cue type and reason): able to bathe legs seated and required max assist for peri area cleaning while standing in Stedy Upper Body Dressing : Minimal assistance;Sitting Upper Body Dressing Details (indicate cue type and reason): in recliner Lower Body Dressing: Moderate assistance;Sitting/lateral leans Lower Body Dressing Details (indicate cue type and reason): able to don left prosthesis and required assistance with orthotic on RLE               General ADL Comments: self care performed seated in recliner with patient standing in stedy for peri area cleaning    Extremity/Trunk Assessment              Vision       Perception     Praxis      Cognition Arousal/Alertness: Awake/alert Behavior During Therapy: WFL for tasks assessed/performed Overall Cognitive Status: No family/caregiver present to determine baseline cognitive functioning Area of Impairment: Attention, Safety/judgement, Awareness, Problem solving, Following commands                   Current Attention Level: Selective   Following Commands: Follows multi-step commands with increased time Safety/Judgement: Decreased awareness of safety, Decreased awareness of deficits Awareness: Emergent Problem Solving: Slow processing, Requires verbal cues General Comments: difficulty maintaining WB precautions        Exercises      Shoulder Instructions       General Comments      Pertinent Vitals/ Pain       Pain Assessment Pain Assessment: Faces Faces Pain Scale: Hurts a little bit Pain Location: RLE incisions and left residual limb Pain Descriptors / Indicators: Sore Pain Intervention(s): Monitored during session, Repositioned  Home Living                                          Prior Functioning/Environment              Frequency  Min 3X/week        Progress Toward Goals  OT Goals(current goals can now be found in the care plan section)  Progress  towards OT goals: Progressing toward goals  Acute Rehab OT Goals Patient Stated Goal: get better OT Goal Formulation: With patient Time For Goal Achievement: 03/07/22 Potential to Achieve Goals: Good ADL Goals Pt Will Perform Upper Body Bathing: with set-up;sitting Pt Will Perform Lower Body Bathing: with set-up;sitting/lateral leans;sit to/from stand Pt Will Perform Upper Body Dressing: with set-up;sitting Pt Will Perform Lower Body Dressing: with set-up;sit to/from stand;sitting/lateral leans Pt Will Transfer to Toilet: squat pivot transfer;with supervision Pt Will Perform Toileting - Clothing Manipulation and hygiene: sitting/lateral leans;sit to/from stand;with supervision  Plan Discharge plan remains appropriate    Co-evaluation    PT/OT/SLP Co-Evaluation/Treatment: Yes Reason for Co-Treatment: For patient/therapist safety;To address functional/ADL transfers   OT goals addressed during session: ADL's and self-care      AM-PAC OT "6 Clicks" Daily Activity     Outcome Measure   Help from another person eating meals?: None Help from another person taking care of personal grooming?: A Little Help from another person toileting, which includes using toliet, bedpan, or urinal?: A Lot Help from another person bathing (including washing, rinsing, drying)?: A Lot Help from another person to put on and taking off  regular upper body clothing?: A Little Help from another person to put on and taking off regular lower body clothing?: A Lot 6 Click Score: 16    End of Session Equipment Utilized During Treatment: Gait belt;Rolling walker (2 wheels);Other (comment) Charlaine Dalton)  OT Visit Diagnosis: Unsteadiness on feet (R26.81)   Activity Tolerance Patient tolerated treatment well   Patient Left in chair;with call bell/phone within reach;with chair alarm set   Nurse Communication Mobility status;Need for lift equipment        Time: [id number] OT Time Calculation (min): 28  min  Charges: OT General Charges $OT Visit: 1 Visit OT Treatments $Self Care/Home Management : 8-22 mins  Lodema Hong, [place]  Pager [phone] Office [place] 03/06/2022, 3:03 PM

## 2022-03-06 NOTE — Progress Notes (Signed)
Subjective: 7 Days Post-Op Procedure(s) (LRB): RIGHT PARTIAL AMPUTATION FOOT (Right) Partial fifth ray amputation RT foot.  DOS: 02/27/2022.  Not having any pain in the foot. No fevers, chills or other concerns today.   Objective: Today's Vitals   03/05/22 2009 03/06/22 0014 03/06/22 0420 03/06/22 0748  BP:  129/67 (!) 133/54 122/60  Pulse:  67 63 (!) 57  Resp:  18 20 (!) 23  Temp:  97.9 F (36.6 C) 97.9 F (36.6 C) 97.9 F (36.6 C)  TempSrc:  Oral Oral Axillary  SpO2:  96% 99% 97%  Weight:   99.5 kg   Height:      PainSc: 0-No pain      Body mass index is 26.7 kg/m.   Dressing clean, dry, intact without any strikethrough. Incision is well coapted with staples intact.  Minimal edema but there is no erythema warmth there is no drainage or pus noted.  No signs of cellulitis or abscess.     Assessment/Plan: 7 Days Post-Op Procedure(s) (LRB): RIGHT PARTIAL AMPUTATION FOOT (Right) DOS: 02/27/2022 -White blood cell count improved today.  -Dressing changed today.  -ABX as per ID.  -BKA LLE.  Patient may continue to weight-bear minimally RLE for transition purposes only -Planned discharge to [place], DPM

## 2022-03-06 NOTE — Progress Notes (Signed)
Inpatient Rehab Admissions Coordinator:   Awaiting determination from [facility] Medicare regarding expedited appeal begun yesterday afternoon.  Will follow.   Estill Dooms, PT, DPT Admissions Coordinator [phone] 03/06/22  10:14 AM

## 2022-03-06 NOTE — Progress Notes (Signed)
Mobility Specialist Progress Note   03/06/22 1900  Mobility  Activity Transferred from chair to bed  Level of Assistance Standby assist, set-up cues, supervision of patient - no hands on  Assistive Device Sliding board  RLE Weight Bearing PWB  RLE Partial Weight Bearing Percentage or Pounds  (Heels only)  Activity Response Tolerated well  $Mobility charge 1 Mobility   RN requesting assistance to get pt from chair to bed. Required supervision + minimal verbal cues for body placement while using the sliding board for transfer. Pt left in bed w/ RN in room and call bell in reach.  Frederico Hamman Mobility Specialist Phone Number [phone]

## 2022-03-06 NOTE — Care Management Important Message (Signed)
Important Message  Patient Details  Name: NAHUEL ENCE MRN: [id number] Date of Birth: 21-May-1956   Medicare Important Message Given:  Yes     Shelda Altes 03/06/2022, 9:16 AM

## 2022-03-06 NOTE — Progress Notes (Incomplete)
PMR Admission Coordinator Pre-Admission Assessment   Patient: Austin [name] is an 66 y.o., male MRN: [id number] DOB: Dec 23, 1955 Height: '6\' 4"'  (193 cm) Weight: 101.6 kg   Insurance Information HMO: yes    PPO:      PCP:      IPA:      80/20:      OTHER:  PRIMARY: UHC Medicare      Policy#: [id number]      Subscriber: pt CM Name: ***      Phone#: [phone]     Fax#: [phone] Pre-Cert#: [id number] auth for CIR from ***      Employer:  Benefits:  Phone #: [phone]     Name:  Eff. Date: 01/05/22     Deduct: $0      Out of Pocket Max: $8300 (met $17.36)      Life Max: n/a CIR: $1556/admit      SNF: 20 full days Outpatient: 80%     Co-Ins: 20% Home Health: 100%      Co-Pay:  DME: 80%     Co-Ins: 20% Providers:  SECONDARY: Medicaid      Policy#: [id number]     Phone#: [phone]   Financial Counselor:       Phone#:    The "Data Collection Information Summary" for patients in Inpatient Rehabilitation Facilities with attached "Privacy Act [place] Records" was provided and verbally reviewed with: Patient   Emergency Contact Information Contact Information       Name Relation Home Work Mobile    Glen Sister [phone]   [phone]    Babatunde, Seago [phone]   [phone]    Donald Siva     [phone]           Current Medical History  Patient Admitting Diagnosis: debility    History of Present Illness: Pt is a 66 y/o with PMH of PVD s/p 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  MRI negative.  He underwent failed RLE arteriorgram and angioplasty of external iliac artery on 5/17 and tibial bypass on 5/22, as well as 5th ray amputation on 5/24.  Post op course ID consult with recs for rocephin, flagyl, and vancomycin.  Therapy ongoing and pt was recommended for CIR.     Complete NIHSS TOTAL: 0   Patient's medical record from [facility] has  been reviewed by the rehabilitation admission coordinator and physician.   Past Medical History      Past Medical History:  Diagnosis Date   Coronary artery disease     Diabetes ([place])     DVT (deep venous thrombosis) ([place])     Gangrene of left foot ([place]) 02/09/2018   GERD (gastroesophageal reflux disease)     Hyperlipidemia     Hypertension     Peripheral vascular disease ([place])        Has the patient had major surgery during 100 days prior to admission? Yes   Family History   family history includes Coronary artery disease in his father; Diabetes in his mother; Hypertension in his sister; Leukemia in his brother.   Current Medications   Current Facility-Administered Medications:    0.9 %  sodium chloride infusion, 500 mL, Intravenous, Once PRN, Rhyne, Samantha J, PA-C   0.9 %  sodium chloride infusion, , Intravenous, Continuous, Rhyne, Samantha J, PA-C, Stopped at 02/28/22 2204   acetaminophen (TYLENOL) tablet 325-650 mg, 325-650 mg,  Oral, Q4H PRN, 650 mg at 02/26/22 0551 **OR** acetaminophen (TYLENOL) suppository 325-650 mg, 325-650 mg, Rectal, Q4H PRN, Rhyne, Samantha J, PA-C   alum & mag hydroxide-simeth (MAALOX/MYLANTA) 200-200-20 MG/5ML suspension 15-30 mL, 15-30 mL, Oral, Q2H PRN, Rhyne, Samantha J, PA-C   apixaban (ELIQUIS) tablet 5 mg, 5 mg, Oral, BID, Vann, Jessica U, DO, 5 mg at 03/01/22 0947   aspirin EC tablet 81 mg, 81 mg, Oral, Daily, Rhyne, Samantha J, PA-C, 81 mg at 03/01/22 0947   atorvastatin (LIPITOR) tablet 80 mg, 80 mg, Oral, q1800, Rhyne, Samantha J, PA-C, 80 mg at 02/28/22 1626   cefTRIAXone (ROCEPHIN) 2 g in sodium chloride 0.9 % 100 mL IVPB, 2 g, Intravenous, Q24H, Rhyne, Samantha J, PA-C, Last Rate: 200 mL/hr at 03/01/22 0953, 2 g at 03/01/22 0953   Chlorhexidine Gluconate Cloth 2 % PADS 6 each, 6 each, Topical, Daily, Elgergawy, Silver Huguenin, MD, 6 each at 02/28/22 1148   clopidogrel (PLAVIX) tablet 75 mg, 75 mg, Oral, Q0600, Rhyne, Samantha J, PA-C, 75 mg at  03/01/22 1102   guaiFENesin (ROBITUSSIN) 100 MG/5ML liquid 5 mL, 5 mL, Oral, Q4H PRN, Rhyne, Samantha J, PA-C   hydrALAZINE (APRESOLINE) injection 10 mg, 10 mg, Intravenous, Q4H PRN, Rhyne, Samantha J, PA-C   insulin aspart (novoLOG) injection 0-15 Units, 0-15 Units, Subcutaneous, TID WC, Rhyne, Samantha J, PA-C, 5 Units at 03/01/22 1312   insulin aspart (novoLOG) injection 0-5 Units, 0-5 Units, Subcutaneous, QHS, Rhyne, Samantha J, PA-C, 3 Units at 02/28/22 2202   insulin aspart (novoLOG) injection 8 Units, 8 Units, Subcutaneous, TID WC, Vann, Jessica U, DO, 8 Units at 03/01/22 1313   insulin glargine-yfgn (SEMGLEE) injection 26 Units, 26 Units, Subcutaneous, BID, Vann, Jessica U, DO, 26 Units at 03/01/22 0949   ipratropium-albuterol (DUONEB) 0.5-2.5 (3) MG/3ML nebulizer solution 3 mL, 3 mL, Nebulization, Q4H PRN, Rhyne, Samantha J, PA-C   labetalol (NORMODYNE) injection 10 mg, 10 mg, Intravenous, Q10 min PRN, Rhyne, Samantha J, PA-C, 10 mg at 02/26/22 0424   losartan (COZAAR) tablet 25 mg, 25 mg, Oral, Daily, Rhyne, Samantha J, PA-C, 25 mg at 03/01/22 0947   metoprolol tartrate (LOPRESSOR) injection 2-5 mg, 2-5 mg, Intravenous, Q2H PRN, Rhyne, Samantha J, PA-C   metroNIDAZOLE (FLAGYL) tablet 500 mg, 500 mg, Oral, Q12H, Rhyne, Samantha J, PA-C, 500 mg at 03/01/22 1101   morphine (PF) 2 MG/ML injection 2 mg, 2 mg, Intravenous, Q2H PRN, Rhyne, Samantha J, PA-C, 2 mg at 02/26/22 0420   ondansetron (ZOFRAN) tablet 4 mg, 4 mg, Oral, Q6H PRN **OR** ondansetron (ZOFRAN) injection 4 mg, 4 mg, Intravenous, Q6H PRN, Rhyne, Samantha J, PA-C   oxyCODONE (Oxy IR/ROXICODONE) immediate release tablet 5 mg, 5 mg, Oral, Q4H PRN, Rhyne, Samantha J, PA-C, 5 mg at 02/28/22 2200   pantoprazole (PROTONIX) EC tablet 40 mg, 40 mg, Oral, Daily, Rhyne, Samantha J, PA-C, 40 mg at 03/01/22 0947   phenol (CHLORASEPTIC) mouth spray 1 spray, 1 spray, Mouth/Throat, PRN, Rhyne, Samantha J, PA-C   polyethylene glycol (MIRALAX /  GLYCOLAX) packet 17 g, 17 g, Oral, Daily, Rhyne, Samantha J, PA-C, 17 g at 02/26/22 0846   senna-docusate (Senokot-S) tablet 1 tablet, 1 tablet, Oral, QHS PRN, Rhyne, Samantha J, PA-C, 1 tablet at 02/25/22 2138   sodium chloride (OCEAN) 0.65 % nasal spray 1 spray, 1 spray, Each Nare, PRN, Elgergawy, Silver Huguenin, MD, 1 spray at 02/26/22 2249   sodium chloride flush (NS) 0.9 % injection 3 mL, 3 mL, Intravenous, Once, Rhyne, Hulen Shouts,  PA-C   traZODone (DESYREL) tablet 50 mg, 50 mg, Oral, QHS PRN, Rhyne, Samantha J, PA-C, 50 mg at 02/23/22 2214   Patients Current Diet:  Diet Order                  Diet Carb Modified Fluid consistency: Thin; Room service appropriate? Yes with Assist  Diet effective now                         Precautions / Restrictions Precautions Precautions: Fall, Other (comment) Precaution Comments: H/o L BKA (prosthetic in room; pt's girlfriend brought in prosthetic sleeve/sock on 5/24) Other Brace: RLE darco shoe (in room) Restrictions Weight Bearing Restrictions: Yes RLE Weight Bearing: Partial weight bearing Other Position/Activity Restrictions: WB through R heel for transfers only per podiatry    Has the patient had 2 or more falls or a fall with injury in the past year? Yes   Prior Activity Level Household: mod I prior to admit, prosthetic and RW, not driving   Prior Functional Level Self Care: Did the patient need help bathing, dressing, using the toilet or eating? Independent   Indoor Mobility: Did the patient need assistance with walking from room to room (with or without device)? Independent   Stairs: Did the patient need assistance with internal or external stairs (with or without device)? Independent   Functional Cognition: Did the patient need help planning regular tasks such as shopping or remembering to take medications? Independent   Patient Information Are you of Hispanic, Latino/a,or Spanish origin?: A. No, not of Hispanic, Latino/a, or  Spanish origin What is your race?: B. Black or African American Do you need or want an interpreter to communicate with a doctor or health care staff?: 0. No   Patient's Response To:  Health Literacy and Transportation Is the patient able to respond to health literacy and transportation needs?: Yes Health Literacy - How often do you need to have someone help you when you read instructions, pamphlets, or other written material from your doctor or pharmacy?: Always In the past 12 months, has lack of transportation kept you from medical appointments or from getting medications?: Yes In the past 12 months, has lack of transportation kept you from meetings, work, or from getting things needed for daily living?: Yes   [place] / [place] Devices/Equipment: Environmental consultant (specify type), Prosthesis Home Equipment: Rolling Walker (2 wheels), Wheelchair - manual, Tub bench   Prior Device Use: Indicate devices/aids used by the patient prior to current illness, exacerbation or injury? Walker and Orthotics/Prosthetics   Current Functional Level Cognition   Overall Cognitive Status: No family/caregiver present to determine baseline cognitive functioning Current Attention Level: Selective Orientation Level: Oriented X4 Following Commands: Follows multi-step commands with increased time Safety/Judgement: Decreased awareness of safety, Decreased awareness of deficits General Comments: poor insight into deficits and activity tolerance; pt unable to complete full pivot on second attempt (returning from [facility] to recliner after initial pivot from elevated bed>BSC) and states "I'm not tired at all" although he had been unable to stand fully upright and pivot, and chair had to be pulled up behind him. Pt very eager to progress strength/independence with excellent participation as able. Decreased awareness of safety as well.    Extremity Assessment (includes Sensation/Coordination)   Upper  Extremity Assessment: Generalized weakness, Overall WFL for tasks assessed  Lower Extremity Assessment: Defer to PT evaluation RLE Deficits / Details: s/p RLE revascularization; hip and knee functionally  at least 3/5 strength RLE Sensation: history of peripheral neuropathy LLE Deficits / Details: h/o L BKA; hip and knee functionally at least 3/5 strength LLE Sensation: history of peripheral neuropathy     ADLs   Overall ADL's : Needs assistance/impaired Eating/Feeding: Independent Grooming: Wash/dry hands, Wash/dry face, Sitting Upper Body Bathing: Set up, Sitting Lower Body Bathing: Moderate assistance, Sitting/lateral leans Lower Body Bathing Details (indicate cue type and reason): to wash below knee level Upper Body Dressing : Minimal assistance, Sitting Upper Body Dressing Details (indicate cue type and reason): to don gown Lower Body Dressing: Maximal assistance, Sitting/lateral leans Lower Body Dressing Details (indicate cue type and reason): donning orthotic RLE with figure 4 bed level Toilet Transfer: +2 for physical assistance, +2 for safety/equipment, Moderate assistance, BSC/3in1, Rolling walker (2 wheels), Stand-pivot, Cueing for safety, Cueing for sequencing Toilet Transfer Details (indicate cue type and reason): Pt was Mod A +2 SPT from bed that was raised significantly secondary to WB through R LE during transfers/heel only orders per podiatry/chart review. Pt required VC/TC Toileting- Clothing Manipulation and Hygiene: Maximal assistance, +2 for physical assistance, +2 for safety/equipment, Cueing for safety, Cueing for sequencing, Sit to/from stand Functional mobility during ADLs: Moderate assistance, Maximal assistance, +2 for physical assistance, +2 for safety/equipment, Cueing for safety, Cueing for sequencing, Rolling walker (2 wheels) (From elevated surface) General ADL Comments: Pt was seen for OT ADL retraining session today with focus on bed moblity, functional mobility  and transfers (3:1 and recliner) in reparation for increased independence with ADL's and self care tasks, grooming while seated and pt education. Pt was initially Mod A +2 for sit to stand and SPT to 3:1 from EOB that was elevated significantly. After resting, pt was Max A +2 from lower surface of 3:1 to recliner chair. Pt requires frequent/constant vc's/tc's and step by step instruction to maintain WB precautions (WB R LE only through heel during transfers per podiatry). Lift pad was placed behind pt and RN was educated to use maxisky PRN to assist pt back to bed. Pt is set-up for grooming while seated prior to lunch. He should benfit from AIR as he is motivated and has family/friends support for Mod I level.     Mobility   Overal bed mobility: Needs Assistance Bed Mobility: Supine to Sit Rolling: Min assist Supine to sit: Min guard, HOB elevated General bed mobility comments: Supervision to initiate, min guard for safety; supervision for advancing hips/anterior scooting     Transfers   Overall transfer level: Needs assistance Equipment used: Rolling walker (2 wheels) Transfers: Sit to/from Stand, Bed to chair/wheelchair/BSC Sit to Stand: Mod assist, +2 physical assistance, +2 safety/equipment, From elevated surface Bed to/from chair/wheelchair/BSC transfer type:: Stand pivot Stand pivot transfers: Max assist, +2 physical assistance, +2 safety/equipment, From elevated surface  Lateral/Scoot Transfers: Mod assist General transfer comment: Pt was initially Mod A +2 for sit to stand and SPT to 3:1 from EOB that was elevated significantly. After resting, pt was Max A +2 from lower surface of 3:1 with difficulty standing fully upright/extending B knees and ultimately needed  recliner chair pulled up behind him. Pt requires frequent/constant vc's/tc's and step by step instruction to maintain WB precautions (WB R LE only through heel during transfers per podiatry) and pt frequently placing weight through  his toes, causing front of darco shoe to tip forward. Lift pad was placed behind pt and RN was educated to use maxisky PRN to assist pt back to bed vs have him perform  seated scoot, pt unsafe to perform stand pivot back to bed with +2 assist from nursing staff. RN/unit secretary asked to get drop arm bedside commode for his room as this would be safer for him than pivoting to [facility]     Ambulation / Gait / Stairs / Wheelchair Mobility         Posture / Balance Dynamic Sitting Balance Sitting balance - Comments: prolonged sitting EOB without UE support, R foot on floor Balance Overall balance assessment: Needs assistance Sitting-balance support: Single extremity supported, No upper extremity supported Sitting balance-Leahy Scale: Fair Sitting balance - Comments: prolonged sitting EOB without UE support, R foot on floor Standing balance support: Bilateral upper extremity supported, Reliant on assistive device for balance Standing balance-Leahy Scale: Zero Standing balance comment: Difficulty/unable to achieve fully upright standing despite mod-maxA+2 as pt appears to have difficulty with WB precautions through R LE and LLE knee remaining somewhat flexed after initial transfer, likely due to fatigue. +2 maxA to maintain upright at RW     Special needs/care consideration Continuous Drip IV  vancomycin, rocephin, Skin RLE incisions, and Diabetic management yes    Previous Home Environment (from acute therapy documentation) Living Arrangements: Non-relatives/Friends Available Help at Discharge: Family, Friend(s), Available 24 hours/day Type of Home: House Home Layout: One level Home Access: Level entry Bathroom Shower/Tub: Tub/shower unit (Takes sponge baths at sink) Biochemist, clinical: Handicapped height Bathroom Accessibility: Yes How Accessible: Accessible via wheelchair, Accessible via walker [place]: No Additional Comments: lives with friend; will also have assist from brother and  sister at d/c   Discharge Living Setting Plans for Discharge Living Setting: Patient's home, Lives with (comment) (s/o Clarise Cruz) Type of Home at Discharge: House Discharge Home Layout: One level Discharge Home Access: Level entry Discharge Bathroom Shower/Tub: Tub/shower unit Discharge Bathroom Toilet: Handicapped height Discharge Bathroom Accessibility: Yes How Accessible: Accessible via wheelchair Does the patient have any problems obtaining your medications?: No   Social/Family/Support Systems Anticipated Caregiver: significant other Clarise Cruz, sister Deloris, and brother Anticipated Ambulance person Information: Deloris [phone] Ability/Limitations of Caregiver: min assist mobility Caregiver Availability: 24/7 Discharge Plan Discussed with Primary Caregiver: Yes Is Caregiver In Agreement with Plan?: Yes Does Caregiver/Family have Issues with Lodging/Transportation while Pt is in Rehab?: Yes (no car)   Goals Patient/Family Goal for Rehab: PT/OT min assist w/c level, SL n/a Expected length of stay: 14-16 days Pt/Family Agrees to Admission and willing to participate: Yes Program Orientation Provided & Reviewed with Pt/Caregiver Including Roles  & Responsibilities: Yes  Barriers to Discharge: Insurance for SNF coverage   Decrease burden of Care through IP rehab admission: n/a   Possible need for SNF placement upon discharge: Not anticipated   Patient Condition: I have reviewed medical records from [facility], spoken with CM, and patient. I met with patient at the bedside for inpatient rehabilitation assessment.  Patient will benefit from ongoing PT and OT, can actively participate in 3 hours of therapy a day 5 days of the week, and can make measurable gains during the admission.  Patient will also benefit from the coordinated team approach during an Inpatient Acute Rehabilitation admission.  The patient will receive intensive therapy as well as Rehabilitation physician, nursing, social  worker, and care management interventions.  Due to safety, skin/wound care, disease management, medication administration, pain management, and patient education the patient requires 24 hour a day rehabilitation nursing.  The patient is currently mod/max assist with mobility and basic ADLs.  Discharge setting and therapy post discharge  at home with home health is anticipated.  Patient has agreed to participate in the Acute Inpatient Rehabilitation Program and will admit {Time; today/tomorrow:10263}.   Preadmission Screen Completed By: Shann Medal, PT, DPT and  Michel Santee, 03/01/2022 4:08 PM

## 2022-03-06 NOTE — Progress Notes (Signed)
Progress Note Patient: Austin [name] DOB: 1956-06-25 DOA: 02/15/2022  DOS: the patient was seen and examined on 03/06/2022  Brief hospital course: Mr. Austin [name] is a 66 yo male with PMH CAD, DM II, HLD, HTN, PVD s/p left BKA who presented with weakness and altered mentation.  Work-up revealed diabetic foot ulcer involving his right foot.  He underwent evaluations with podiatry, ID, and vascular surgery. He underwent right iliac artery stent on 02/20/2022 followed by right common femoral artery to tibial peroneal trunk bypass with ipsilateral nonreversed great saphenous vein on 5/22. This was then followed by partial fifth ray amputation right foot on 02/27/2022. He has since been evaluated for going to CIR at discharge. Assessment and Plan: * Diabetic foot ulcer associated with type 2 diabetes mellitus (Austin [name]) -S/p partial fifth ray amputation right foot on 02/27/2022 -Group B strep and now rare staph (MSSA) noted on wound culture from surgery;  -Weight-bear minimally RLE for transition purposes only per podiatry recommendations  Osteomyelitis of fifth toe of right foot (Austin [name]) - s/p partial 5th ray amputation right foot 02/27/22 - ID following as well -Antibiotics de-escalated from Rocephin/Flagyl down to Unasyn on 03/02/2022 to cover GBS. Culture then updated on 5/28 to include rare Staph thus vanc added per ID; now has speciated to MSSA so vanc d'c once again - continue monotherapy unasyn with plans to transition to Augmentin at discharge to complete course on 03/27/22 per ID  Peripheral vascular disease (Austin [name]) - s/p right iliac artery stent on 02/20/2022 followed by right common femoral artery to tibial peroneal trunk bypass with ipsilateral nonreversed great saphenous vein on 5/22. -Recommended for aspirin and Plavix for 1 month then aspirin -Follow-up with vascular surgery in 2 to 3 weeks for incision checks  Acute metabolic encephalopathy-resolved as of 03/02/2022 - MRI brain with  remote infarct.  Seen by neurology (5/14: MRI brain negative for acute stroke. No further inpatient neurologic workup indicated. Neurology to sign off) -TSH and ammonia within normal limits  Sepsis with acute organ dysfunction without septic shock (Austin [name])-resolved as of 03/02/2022 - initially had fever, tachycardia; source foot ulcer - see above  Atrial fibrillation, chronic (Austin [name]) - Continue eliquis and lopressor  Status post below-knee amputation (Austin [name]) - left side - Patient has prosthesis  Type 2 diabetes mellitus with diabetic peripheral angiopathy without gangrene (Austin [name]) - A1c 11.3% on 02/17/2022 - Continue Semglee and NovoLog  Essential hypertension - Continue cozaar  Subjective: No nausea no vomiting no fever no chills no chest pain no abdominal pain.  Pain is well controlled on his foot.  Physical Exam: Vitals:   03/06/22 0420 03/06/22 0748 03/06/22 1208 03/06/22 1540  BP: (!) 133/54 122/60 125/90 115/89  Pulse: 63 (!) 57 76 100  Resp: 20 (!) 23 20 17   Temp: 97.9 F (36.6 C) 97.9 F (36.6 C) (!) 97.5 F (36.4 C) (!) 97.4 F (36.3 C)  TempSrc: Oral Axillary Oral Oral  SpO2: 99% 97% 97% 98%  Weight: 99.5 kg     Height:       General: Appear in mild distress; no visible Abnormal Neck Mass Or lumps, Conjunctiva normal Cardiovascular: S1 and S2 Present, no Murmur, Respiratory: good respiratory effort, Bilateral Air entry present and CTA, no Crackles, no wheezes Abdomen: Bowel Sound present, Non tender  Extremities: no Pedal edema Neurology: alert and oriented to time, place, and person  Gait not checked due to patient safety concerns   Data Reviewed: I have reviewed the last note from podiatry,  Family Communication: None at bedside  Disposition: Status is: Inpatient Remains inpatient appropriate because: Awaiting CIR appeal results  Author: Berle Mull, MD 03/06/2022 6:48 PM  Please look on [email or web address] to find out who is on call.

## 2022-03-06 NOTE — Progress Notes (Signed)
Physical Therapy Treatment Patient Details Name: Austin [name] MRN: [id number] DOB: 1956/09/09 Today's Date: 03/06/2022   History of Present Illness Pt is a 66 y.o. 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 Brain MRI negative for acute injury. S/p RLE arteriogram and angioplasty of external iliac artery on 5/17. S/p RLE tibial bypass graft on 5/22. 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 PMH includes PVD s/p L BKA (10/2018) with prosthetic, DM, DVT, HTN, HLD.    PT Comments    Pt received in supine, bed noted to be wet (purewick on but was not catching all urine), pt agreeable to therapy session and motivated to progress standing tolerance and BLE strength. Pt needing up to +2 maxA for pivotal transfers toward his stronger L side using RW and up to +2 modA for sit<>stand from chair to [facility] for strengthening. Pt able to stand 1-2 minutes in [place] for strengthening with noted L quad fasciculations and increased trunk flexion with fatigue. Pt with poor compliance maintaining R heel WB precautions during pivot from bed>chair so RN notified he did better with R heel WB standing in [place], will need to perform SB vs Stedy transfer to return to bed safely. Pt denies significant fatigue post-exertion, reporting ~2/10 modified RPE. Pt continues to benefit from PT services to progress toward functional mobility goals and remains a good candidate for high intensity post-acute rehab.   Recommendations for follow up therapy are one component of a multi-disciplinary discharge planning process, led by the attending physician.  Recommendations may be updated based on patient status, additional functional criteria and insurance authorization.  Follow Up Recommendations  Acute inpatient rehab (3hours/day)     Assistance Recommended at Discharge Intermittent Supervision/Assistance  Patient can return home with the following Two people  to help with walking and/or transfers;A lot of help with bathing/dressing/bathroom;Assistance with cooking/housework;Assist for transportation;Help with stairs or ramp for entrance   Equipment Recommendations  None recommended by PT (pending progression of mobility)    Recommendations for Other Services Rehab consult     Precautions / Restrictions Precautions Precautions: Fall;Other (comment) Precaution Comments: H/o L BKA (prosthetic in room) Required Braces or Orthoses: Other Brace Other Brace: RLE darco shoe (in room) Restrictions Weight Bearing Restrictions: Yes RLE Weight Bearing: Partial weight bearing Other Position/Activity Restrictions: WB through R heel for transfers only per podiatry     Mobility  Bed Mobility Overal bed mobility: Needs Assistance Bed Mobility: Supine to Sit     Supine to sit: Min assist, HOB elevated     General bed mobility comments: required assistance raising trunk, pt reliant on bed rail and HOB very elevated while performing    Transfers Overall transfer level: Needs assistance Equipment used: Rolling walker (2 wheels), Ambulation equipment used Transfers: Sit to/from Stand Sit to Stand: Mod assist, +2 safety/equipment, +2 physical assistance Stand pivot transfers: Max assist, +2 physical assistance, +2 safety/equipment, From elevated surface         General transfer comment: stood from EOB with mod assist +2 to RW and max assist +2 to transfer to  recliner on his L side due to WB precautions; pt with improved compliance with R heel WB precautions standing to [employer] x2 trials from chair       Balance Overall balance assessment: Needs assistance Sitting-balance support: Single extremity supported, No upper extremity supported Sitting balance-Leahy Scale: Fair Sitting balance - Comments: able to don L BKA  prosthetic with assist for set-up; requires assist to don R darco shoe   Standing balance support: Bilateral upper extremity  supported, Reliant on assistive device for balance Standing balance-Leahy Scale: Poor Standing balance comment: Pt maintains somewhat flexed trunk in Stedy and RW but improved compliance with RLE heel WB standing in [place] frame. +2 assist needed with each method.                            Cognition Arousal/Alertness: Awake/alert Behavior During Therapy: WFL for tasks assessed/performed Overall Cognitive Status: No family/caregiver present to determine baseline cognitive functioning Area of Impairment: Attention, Safety/judgement, Awareness, Problem solving, Following commands                   Current Attention Level: Selective   Following Commands: Follows multi-step commands with increased time Safety/Judgement: Decreased awareness of safety, Decreased awareness of deficits Awareness: Emergent Problem Solving: Slow processing, Requires verbal cues General Comments: difficulty maintaining WB precautions despite instructions        Exercises General Exercises - Lower Extremity Ankle Circles/Pumps: AROM, Right, Seated Quad Sets: AROM, Both, 5 reps, Other (comment) (encouraged him to perform hourly with 5 second hold each rep; noted slightly improved quad contractions from previous week) Other Exercises Other Exercises: IS x 10 reps (500-700 mL) Other Exercises: noted RLE resting in hip ER/ABD with knee flexion contracture - repositioned and pillows placed to encourage neutral alignment, including increased hip IR/ADD, knee ext Other Exercises: STS x 3 reps (and static standing with R heel WB only for strengthening)    General Comments General comments (skin integrity, edema, etc.): SpO2 97% on RA at rest, HR 100 bpm sitting and to 114 bpm with exertion, 74 bpm resting in recliner      Pertinent Vitals/Pain Pain Assessment Pain Assessment: 0-10 Pain Score: 8  Faces Pain Scale: Hurts little more Pain Location: RLE incisions and left residual limb, bottom  discomfort from wet bed Pain Descriptors / Indicators: Sore Pain Intervention(s): Monitored during session, Repositioned     PT Goals (current goals can now be found in the care plan section) Acute Rehab PT Goals Patient Stated Goal: To return home and be independent in getting around and caring for myself. PT Goal Formulation: With patient Time For Goal Achievement: 03/19/22 Progress towards PT goals: Progressing toward goals    Frequency    Min 3X/week      PT Plan Current plan remains appropriate    Co-evaluation PT/OT/SLP Co-Evaluation/Treatment: Yes Reason for Co-Treatment: For patient/therapist safety;To address functional/ADL transfers PT goals addressed during session: Mobility/safety with mobility;Balance;Proper use of DME;Strengthening/ROM OT goals addressed during session: ADL's and self-care      AM-PAC PT "6 Clicks" Mobility   Outcome Measure  Help needed turning from your back to your side while in a flat bed without using bedrails?: A Little Help needed moving from lying on your back to sitting on the side of a flat bed without using bedrails?: A Lot (from flat bed no rails; only a little from hospital bed) Help needed moving to and from a bed to a chair (including a wheelchair)?: Total Help needed standing up from a chair using your arms (e.g., wheelchair or bedside chair)?: Total (+2 maxA) Help needed to walk in hospital room?: Total Help needed climbing 3-5 steps with a railing? : Total 6 Click Score: 9    End of Session Equipment Utilized During Treatment: Gait belt Activity Tolerance: Patient  tolerated treatment well Patient left: in chair;with call bell/phone within reach;with chair alarm set;Other (comment) (heels floated with pillow pt encouraged to maintain R knee neutral position) Nurse Communication: Mobility status;Need for lift equipment;Other (comment) (use SB scoot vs +2 A and Stedy for return to bed from chair.) PT Visit Diagnosis: Other  abnormalities of gait and mobility (R26.89);Pain Pain - Right/Left: Right Pain - part of body: Ankle and joints of foot     Time: [id number] PT Time Calculation (min) (ACUTE ONLY): 29 min  Charges:  $Therapeutic Exercise: 8-22 mins                     Klyde Banka P., PTA Acute Rehabilitation Services Secure Chat Preferred 9a-5:30pm Office: [place] 03/06/2022, 4:12 PM

## 2022-03-07 DIAGNOSIS — L97513 Non-pressure chronic ulcer of other part of right foot with necrosis of muscle: Secondary | ICD-10-CM | POA: Diagnosis not present

## 2022-03-07 DIAGNOSIS — E11621 Type 2 diabetes mellitus with foot ulcer: Secondary | ICD-10-CM | POA: Diagnosis not present

## 2022-03-07 LAB — GLUCOSE, CAPILLARY
Glucose-Capillary: 146 mg/dL — ABNORMAL HIGH (ref 70–99)
Glucose-Capillary: 176 mg/dL — ABNORMAL HIGH (ref 70–99)
Glucose-Capillary: 180 mg/dL — ABNORMAL HIGH (ref 70–99)
Glucose-Capillary: 144 mg/dL — ABNORMAL HIGH (ref 70–99)

## 2022-03-07 MED ORDER — INSULIN GLARGINE-YFGN 100 UNIT/ML ~~LOC~~ SOLN: 30.0000 [IU] | Freq: Two times a day (BID) | SUBCUTANEOUS | 11 refills | Status: DC

## 2022-03-07 MED ORDER — OXYCODONE HCL 5 MG PO TABS: 5.0000 mg | ORAL_TABLET | ORAL | 0 refills | Status: DC | PRN

## 2022-03-07 MED ORDER — AMOXICILLIN-POT CLAVULANATE 875-125 MG PO TABS
1.0000 | ORAL_TABLET | Freq: Two times a day (BID) | ORAL | Status: DC
  Administered 2022-03-07 – 2022-03-08 (×2): 1 via ORAL
  Filled 2022-03-07 (×3): qty 1

## 2022-03-07 MED ORDER — AMOXICILLIN-POT CLAVULANATE 875-125 MG PO TABS: 1.0000 | ORAL_TABLET | Freq: Two times a day (BID) | ORAL | 0 refills | Status: DC

## 2022-03-07 MED ORDER — PANTOPRAZOLE SODIUM 40 MG PO TBEC: 40.0000 mg | DELAYED_RELEASE_TABLET | Freq: Every day | ORAL | Status: DC

## 2022-03-07 MED ORDER — CLOPIDOGREL BISULFATE 75 MG PO TABS: 75.0000 mg | ORAL_TABLET | Freq: Every day | ORAL | Status: DC

## 2022-03-07 MED ORDER — TRAZODONE HCL 50 MG PO TABS: 50.0000 mg | ORAL_TABLET | Freq: Every evening | ORAL | Status: DC | PRN

## 2022-03-07 NOTE — Progress Notes (Signed)
Subjective: 8 Days Post-Op Procedure(s) (LRB): RIGHT PARTIAL AMPUTATION FOOT (Right) Partial fifth ray amputation RT foot.  DOS: 02/27/2022.  Not having any pain in the foot. No fevers, chills or other concerns today.  He has no concerns and likely discharging to rehab tomorrow.  Objective: Vitals:   03/07/22 1640 03/07/22 1958  BP:  120/77  Pulse:  67  Resp:  13  Temp: (!) 97.4 F (36.3 C) (!) 97.3 F (36.3 C)  SpO2:  100%     Dressing clean, dry, intact without any strikethrough. Incision is well coapted with staples intact.  Minimal edema but there is no erythema warmth there is no drainage or pus noted.  No signs of cellulitis or abscess.  Overall appears to be healing well at this timeframe postoperatively.    Assessment/Plan: 8 Days Post-Op Procedure(s) (LRB): RIGHT PARTIAL AMPUTATION FOOT (Right) DOS: 02/27/2022 -No labs to review today.  Remains afebrile. -Dressing changed today.  Xeroform was applied followed by 4 x 4's, Kerlix, Ace.  Order for daily dressing changes. -ABX as per ID.  -BKA LLE.  Patient may continue to weight-bear minimally RLE for transition purposes only -Planned discharge to CIR-okay from podiatry standpoint for discharge to rehab.  Ovid Curd, DPM

## 2022-03-07 NOTE — Progress Notes (Addendum)
Inpatient Rehab Admissions Coordinator:   Continue to await determination from [facility] Medicare regarding expedited appeal begun Tuesday.  Will continue to follow.   Addendum 1143: I have received insurance approval for CIR.  I do not have a bed for this patient to admit today but will follow for admit as soon as bed becomes available.  Will update pt at bedside today.   Estill Dooms, PT, DPT Admissions Coordinator [phone] 03/07/22  10:43 AM

## 2022-03-07 NOTE — Discharge Summary (Signed)
Physician Discharge Summary  Austin [name] WGN:[id number] DOB: 27-Dec-1955 DOA: 02/15/2022  PCP: Theotis Burrow, MD  Admit date: 02/15/2022 Discharge date: 03/07/2022  Time spent: 55 minutes  Recommendations for Outpatient Follow-up:  Patient needs to complete Augmentin on 03/27/2022 and follow-up with infectious disease for MSSA bacteremia secondary to osteomyelitis Get labs as per protocol at [facility] Continue aspirin Plavix for 1 month and then discontinue Plavix probably on July 1 given his peripheral vascular disease-I will CC vascular surgeon Dr. Gwenlyn Saran who saw the patient to ensure that follow-up is complete Recheck and adjust diabetes meds carefully-had an A1c of 11.3 so needs education  Discharge Diagnoses:  MAIN problem for hospitalization   Osteomyelitis with sepsis on admission  Please see below for itemized issues addressed in [place]- refer to other progress notes for clarity if needed  Discharge Condition: Improved  Diet recommendation: Diabetic heart healthy  Filed Weights   03/05/22 0412 03/06/22 0420 03/07/22 0434  Weight: 98.6 kg 99.5 kg 100.7 kg    History of present illness:  89 black male known history of DM TY 2 Peripheral vascular disease with prior BKA left leg-in the setting of prior iliac SFA and popliteal stenting with tibial angioplasty 02/2018 and PTCA of that area with angioplasty balloons--ultimately underwent left leg amputation He has had fifth metatarsal head osteomyelitis in the past in June 2022 managed conservatively also has had 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 He was admitted 5/12 with worsening ulcer to right foot saw ID vascular surgery podiatry and underwent right iliac artery stenting 5/17 and other procedures ultimately ending up with the fifth ray partial amputation of right foot He is stabilized and will be going to [facility] when they get approval   Hospital Course:   Osteomyelitis right fifth great toe seen by ID placed on Unasyn eventually growing MSSA Transitioning on discharge to Augmentin until 03/27/2022 per ID Diabetic foot wound in the setting of type 2 diabetes mellitus Previously has grown a variety of pathogens this time around growing group B strep and rare MSSA ID saw the patient and recommended Augmentin on discharge Peripheral vascular disease Also had a long digit amputation by Dr. Claudia Desanctis previously Multiple episodes of stenting to salvage lower extremities eventually resulting in left BKA in the past Will need aspirin Plavix until July 1 and then stop and continue only aspirin Metabolic encephalopathy 7/84 No stroke on MRI probably related to pain meds-unclear etiology but now resolved Chronic A-fib CHADVASC >3 Continue Eliquis and Lopressor Poorly controlled diabetes mellitus with A1c 11.3 this admission Will need close monitoring of sugars and secondary prevention-discharging on adjusted meds as below Will need sliding scale Hopeful to go home eventually after [facility] stay   Discharge Exam: Vitals:   03/07/22 0809 03/07/22 1155  BP: 126/88 (!) 147/75  Pulse: 76 86  Resp: (!) 21 20  Temp: (!) 97.3 F (36.3 C) 98.2 F (36.8 C)  SpO2: 99% 100%    Subj on day of d/c   Awake coherent no distress  General Exam on discharge  Awake alert coherent EOMI NCAT Chest clear S1-S2 no murmur no rub no gallop Wound has not been evaluated I have reached out to wound care/podiatry in addition for them to sign off on the wound He will be transitioning off of his Unasyn to Augmentin and we can probably let him go to [facility] tomorrow  Discharge Instructions   Discharge Instructions     Diet -  low sodium heart healthy   Complete by: As directed    Discharge wound care:   Complete by: As directed    Per podiatry   Increase activity slowly   Complete by: As directed       Allergies as of 03/07/2022   No Known Allergies       Medication List     STOP taking these medications    Toujeo Max SoloStar 300 UNIT/ML Solostar Pen Generic drug: insulin glargine (2 Unit Dial)       TAKE these medications    Accu-Chek Guide test strip Generic drug: glucose blood 5 (five) times daily.   Accu-Chek Guide w/Device Kit 5 (five) times daily.   amoxicillin-clavulanate 875-125 MG tablet Commonly known as: AUGMENTIN Take 1 tablet by mouth 2 (two) times daily for 20 days.   apixaban 5 MG Tabs tablet Commonly known as: ELIQUIS Take 1 tablet (5 mg total) by mouth 2 (two) times daily.   aspirin EC 81 MG tablet Take 1 tablet (81 mg total) by mouth daily.   atorvastatin 80 MG tablet Commonly known as: LIPITOR Take 1 tablet (80 mg total) by mouth daily at 6 PM.   clopidogrel 75 MG tablet Commonly known as: PLAVIX Take 1 tablet (75 mg total) by mouth daily at 6 (six) AM. Start taking on: March 08, 2022   HumaLOG KwikPen 100 UNIT/ML KwikPen Generic drug: insulin lispro Inject 5 Units into the skin 3 (three) times daily before meals. What changed: how much to take   insulin glargine-yfgn 100 UNIT/ML injection Commonly known as: SEMGLEE Inject 0.3 mLs (30 Units total) into the skin 2 (two) times daily.   losartan 25 MG tablet Commonly known as: COZAAR Take 25 mg by mouth daily.   oxyCODONE 5 MG immediate release tablet Commonly known as: Oxy IR/ROXICODONE Take 1 tablet (5 mg total) by mouth every 4 (four) hours as needed for moderate pain or severe pain.   pantoprazole 40 MG tablet Commonly known as: PROTONIX Take 1 tablet (40 mg total) by mouth daily. Start taking on: March 08, 2022   traZODone 50 MG tablet Commonly known as: DESYREL Take 1 tablet (50 mg total) by mouth at bedtime as needed for sleep.   TRUEplus 5-Bevel Pen Needles 31G X 6 MM Misc Generic drug: Insulin Pen Needle   Trulicity 2.48 GN/0.0BB Sopn Generic drug: Dulaglutide Inject 0.75 mg into the skin once a week.                Discharge Care Instructions  (From admission, onward)           Start     Ordered   03/07/22 0000  Discharge wound care:       Comments: Per podiatry   03/07/22 1410           No Known Allergies  Follow-up Information     Mignon Pine, DO Follow up on 03/21/2022.   Specialties: Infectious Diseases, Internal Medicine Why: Appointment at 230pm Contact information: [address] [place] [address] [phone]                  The results of significant diagnostics from this hospitalization (including imaging, microbiology, ancillary and laboratory) are listed below for reference.    Significant Diagnostic Studies: MR BRAIN WO CONTRAST  Result Date: 02/16/2022 CLINICAL DATA:  Initial evaluation for acute delirium. EXAM: MRI HEAD WITHOUT CONTRAST TECHNIQUE: Multiplanar, multiecho pulse sequences of the brain and surrounding  structures were obtained without intravenous contrast. COMPARISON:  Prior CTs from 02/15/2022. FINDINGS: Brain: Generalized age-related cerebral atrophy. Patchy and confluent T2/FLAIR hyperintensity involving the periventricular and deep white matter both cerebral hemispheres as well as the deep gray nuclei and pons, most consistent with chronic microvascular ischemic disease, moderately advanced in nature. Small remote left cerebellar infarct noted. No evidence for acute or subacute ischemia. Gray-white matter differentiation maintained. Or is of chronic cortical infarction. No acute or chronic intracranial blood products. No mass lesion, mass effect or midline shift. No hydrocephalus or extra-axial fluid collection. Pituitary gland suprasellar region normal. Vascular: Major intracranial vascular flow voids are maintained. Skull and upper cervical spine: Craniocervical junction normal. Bone marrow signal intensity normal. No scalp soft tissue abnormality. Sinuses/Orbits: Prior bilateral ocular lens replacement. Scattered mucosal  thickening noted throughout the paranasal sinuses. Trace bilateral mastoid effusions, of doubtful significance. Other: None. IMPRESSION: 1. No acute intracranial abnormality. 2. Age-related cerebral atrophy with moderately advanced chronic microvascular ischemic disease. 3. Small remote left cerebellar infarct. Electronically Signed   By: Jeannine Boga M.D.   On: 02/16/2022 22:29   DG Pelvis Portable  Result Date: 02/16/2022 CLINICAL DATA:  Pain. EXAM: PORTABLE PELVIS 1-2 VIEWS COMPARISON:  None Available. FINDINGS: There is no evidence of an acute pelvic fracture or diastasis. No pelvic bone lesions are seen. Moderate severity degenerative changes seen involving both hips in the form of joint space narrowing and acetabular sclerosis. Multiple radiopaque vascular stents are present within the pelvis and bilateral lower extremities. Mildly diluted radiopaque contrast is seen within the urinary bladder. IMPRESSION: 1. No acute osseous abnormality. 2. Moderate severity degenerative changes of both hips. Electronically Signed   By: Virgina Norfolk M.D.   On: 02/16/2022 01:31   MR FOOT RIGHT WO CONTRAST  Result Date: 02/16/2022 CLINICAL DATA:  Suspected osteomyelitis EXAM: MRI OF THE RIGHT FOREFOOT WITHOUT CONTRAST TECHNIQUE: Multiplanar, multisequence MR imaging of the right foot was performed. No intravenous contrast was administered. COMPARISON:  Right foot x-ray 02/15/2022 FINDINGS: Bones/Joint/Cartilage Abnormal bone marrow edema at the distal fifth metatarsal with bone destruction of the metatarsal head. No other suspicious bone marrow signal abnormalities identified in the forefoot. Mild degenerative changes at the first metatarsophalangeal joint and interphalangeal joint noted. Ligaments No acute abnormality. Muscles and Tendons Edema throughout the plantar and dorsal musculature of the foot. No definite tenosynovitis visualized. Soft tissues Soft tissue wound at the lateral aspect of the foot  overlying the fifth metatarsal head region. Diffuse subcutaneous soft tissue edema throughout the dorsum of the foot. IMPRESSION: 1. Evidence of osteomyelitis of the distal fifth metatarsal as described. 2. Overlying soft tissue wound at the lateral foot near the fifth metatarsal head. 3. Diffuse muscular edema throughout the foot. Marked subcutaneous soft tissue edema throughout the dorsum of the foot. Electronically Signed   By: Ofilia Neas M.D.   On: 02/16/2022 21:34   PERIPHERAL VASCULAR CATHETERIZATION  Result Date: 02/20/2022 Patient name: LYFE MONGER       MRN: [id number]        DOB: May 25, 1956          Sex: male  02/20/2022 Pre-operative Diagnosis: Critical limb ischemia of the right lower extremity with tissue loss and foot wound Post-operative diagnosis:  Same Surgeon:  Marty Heck, MD Procedure Performed: 1.  Ultrasound-guided access left common femoral artery 2.  Aortogram with catheter selection of aorta 3.  Right lower extremity arteriogram with selection of second-order branches 4.  Right external iliac artery angioplasty  with stent placement (7 mm x 40 mm self-expanding Innova postdilated with a 6 mm Mustang) 5.  61 minutes of monitored moderate conscious sedation time  Indications: 66 year old male seen in consultation over the weekend with right foot wound.  He has undergone multiple previous lower extremity interventions at [facility].  Ultimately he was seen with a dampened monophasic waveform in the right foot and severely diminished toe pressure of 36 inadequate for wound healing.  He presents today for aortogram, lower extremity arteriogram, possible intervention after risks and benefits discussed.  Findings:  Aortogram showed both renal arteries are patent as well as the infrarenal aorta.  On the right which is the side of interest, his common iliac artery stent and external iliac stent are both widely patent.  There was about a 60% stenosis in the right external iliac  artery distal to his existing stents.  The common femoral is patent with profunda runoff.  There is a flush SFA occlusion on the right and his previous SFA stents in the proximal SFA are occluded.  He does reconstitute a tibioperoneal trunk with runoff in the peroneal and posterior tibial also retrograde filling of the anterior tibial off the TP trunk with runoff into the foot.  Posterior tibial occludes above the ankle.  Ultimately the right external iliac stenosis was stented with a 7 mm self-expanding Innova with no residual stenosis.             Procedure:  The patient was identified in the holding area and taken to room 8.  The patient was then placed supine on the table and prepped and draped in the usual sterile fashion.  A time out was called.  Patient did receive moderate sedation with Versed and fentanyl.  All of his vital signs were monitored including heart rate, respiratory rate, blood pressure and oxygenation.  I was present for the entire sedation.  Ultrasound was used to evaluate the left common femoral artery.  It was patent .  A digital ultrasound image was acquired.  A micropuncture needle was used to access the left common femoral artery under ultrasound guidance.  An 018 wire was advanced without resistance and a micropuncture sheath was placed.  The 018 wire was removed and a benson wire was placed.  The micropuncture sheath was exchanged for a 5 french sheath.  An omniflush catheter was advanced over the wire to the level of L-1.  An abdominal angiogram was obtained.  Next, using the omniflush catheter and a benson wire, the aortic bifurcation was crossed and the catheter was placed into the right external iliac artery and right runoff was obtained.  Pertinent findings are noted above.  After evaluating the images, it looked like he needs a right lower extremity tibial bypass.  He has a very diminished femoral pulse on the right.  I elected to stent the 60% right external iliac stenosis for  better inflow to support his bypass.  I upsized to a long 6 [place] Ansell sheath over the aortic bifurcation in the left groin with a Rosen wire.  Patient was given 100 units/kg IV heparin.  I then got dedicated imaging with oblique orientation of the C arm to mark the right external iliac stenosis.  The right external iliac stenosis was stented with a 7 mm x 40 mm self-expanding Innova postdilated with a 6 mm Mustang.  Excellent results with no residual stenosis.  Sheath was pulled back over the aortic bifurcation and he was taken to holding for sheath  pull.   Plan: Right external iliac artery 60% stenosis was stented with a 7 mm x 40 mm self-expanding Innova with excellent results.  Patient will need vein mapping and right leg tibial bypass given flush right SFA occlusion.              Marty Heck, MD [facility] Office: [phone]   DG Chest Port 1 View  Result Date: 02/15/2022 CLINICAL DATA:  Sepsis and right foot wound. EXAM: PORTABLE CHEST 1 VIEW COMPARISON:  October 22, 2018 FINDINGS: Multiple overlying radiopaque cardiac lead wires are seen. The heart size and mediastinal contours are within normal limits. Both lungs are clear. The visualized skeletal structures are unremarkable. IMPRESSION: No active disease. Electronically Signed   By: Virgina Norfolk M.D.   On: 02/15/2022 22:18   DG Foot Complete Right  Result Date: 02/27/2022 CLINICAL DATA:  Osteomyelitis left fifth metatarsal EXAM: RIGHT FOOT COMPLETE - 3+ VIEW COMPARISON:  02/15/2022 FINDINGS: There is interval surgical removal of right fifth toe. There is interval removal of right fifth metatarsal from the level of proximal shaft of fifth metatarsal. Small plantar spur is seen in calcaneus. Bony spurs are noted in the dorsal aspect of talonavicular joint. There is soft tissue swelling over the dorsum. IMPRESSION: Status post surgical removal of right fifth toe and partial resection of right fifth  metatarsal. Electronically Signed   By: Elmer Picker M.D.   On: 02/27/2022 19:49   DG Foot Complete Right  Result Date: 02/15/2022 CLINICAL DATA:  Sepsis.  Right foot wound. EXAM: RIGHT FOOT COMPLETE - 3+ VIEW COMPARISON:  None Available. FINDINGS: There is no evidence of fracture or dislocation. The second through fifth right toes are partially flexed in position and subsequently limited in evaluation. Mild degenerative changes are seen along the dorsal aspect of the proximal to mid right foot. Mild dorsal soft tissue swelling is also seen. IMPRESSION: 1. Mild degenerative changes along the dorsal aspect of the proximal to mid right foot. 2. Mild dorsal soft tissue swelling without an acute osseous abnormality. Electronically Signed   By: Virgina Norfolk M.D.   On: 02/15/2022 22:21   DG MINI C-ARM IMAGE ONLY  Result Date: 02/27/2022 There is no interpretation for this exam.  This order is for images obtained during a surgical procedure.  Please See "Surgeries" Tab for more information regarding the procedure.   VAS [place] LOWER EXTREMITY SAPHENOUS VEIN MAPPING  Result Date: 02/22/2022 LOWER EXTREMITY VEIN MAPPING Patient Name:  RYLE BUSCEMI  Date of Exam:   02/21/2022 Medical Rec #: [id number]         Accession #:    [id number] Date of Birth: 1956/06/24         Patient Gender: M Patient Age:   16 years Exam Location[facility]  [facility] Procedure:      VAS [place] LOWER EXTREMITY SAPHENOUS VEIN MAPPING Referring Phys: Monica Martinez --------------------------------------------------------------------------------  Indications:        Pre-op History:            History of PAD; patient is pre-operative for lower extremity                     bypass graft. Risk Factors:       PAD. Other Risk Factors: Left BKA.  Comparison Study: No prior study Performing Technologist: Sharion Dove RVS  Examination Guidelines: A complete evaluation includes B-mode imaging, spectral Doppler, color Doppler, and power  Doppler as  needed of all accessible portions of each vessel. Bilateral testing is considered an integral part of a complete examination. Limited examinations for reoccurring indications may be performed as noted. +----------------+----------+----------------------+---------------+-----------+ RT Diameter (cm)    RT             GSV            LT Diameter  LT Findings                  Findings                            (cm)                  +----------------+----------+----------------------+---------------+-----------+       0.57                    Saphenofemoral         0.30                                                   Junction                                  +----------------+----------+----------------------+---------------+-----------+       0.58                    Proximal thigh         0.16                  +----------------+----------+----------------------+---------------+-----------+       0.50                      Mid thigh            0.21                  +----------------+----------+----------------------+---------------+-----------+    0.43/0.17    branching      Distal thigh          0.29                  +----------------+----------+----------------------+---------------+-----------+       0.51                         Knee              0.24                  +----------------+----------+----------------------+---------------+-----------+  3.70/0.21/0.19 branching       Prox calf                          BKA     +----------------+----------+----------------------+---------------+-----------+    0.42/0.173   branching        Mid calf                          BKA     +----------------+----------+----------------------+---------------+-----------+    0.29/0.20    branching      Distal calf  BKA     +----------------+----------+----------------------+---------------+-----------+    0.19/0.174   branching          Ankle                            BKA     +----------------+----------+----------------------+---------------+-----------+ Diagnosing physician: Orlie Pollen Electronically signed by Orlie Pollen on 02/22/2022 at 5:13:13 PM.    Final    CT HEAD CODE STROKE WO CONTRAST  Result Date: 02/15/2022 CLINICAL DATA:  Code stroke. Neuro deficit, acute, stroke suspected. Left-sided deficits and slurred speech. EXAM: CT HEAD WITHOUT CONTRAST TECHNIQUE: Contiguous axial images were obtained from the base of the skull through the vertex without intravenous contrast. RADIATION DOSE REDUCTION: This exam was performed according to the departmental dose-optimization program which includes automated exposure control, adjustment of the mA and/or kV according to patient size and/or use of iterative reconstruction technique. COMPARISON:  None Available. FINDINGS: Brain: Within limitations of moderate motion artifact, no acute cortically based infarct, intracranial hemorrhage, mass, midline shift, or extra-axial fluid collection is identified. The ventricles and sulci are within normal limits for age. Patchy hypodensities in the cerebral white matter bilaterally are nonspecific but compatible with mild chronic small vessel ischemic disease. Vascular: Calcified atherosclerosis at the skull base. No hyperdense vessel. Skull: No fracture or suspicious osseous lesion. Sinuses/Orbits: Small mucous retention cyst in the right maxillary sinus. Clear mastoid air cells. Bilateral cataract extraction. Other: None. ASPECTS [facility] Stroke Program Early CT Score) - Ganglionic level infarction (caudate, lentiform nuclei, internal capsule, insula, M1-M3 cortex): 7 - Supraganglionic infarction (M4-M6 cortex): 3 Total score (0-10 with 10 being normal): 10 IMPRESSION: 1. No acute infarct or intracranial hemorrhage identified on this motion degraded study. ASPECTS of 10. 2. Mild chronic small vessel ischemic disease. These results were communicated  to Dr. Lorrin Goodell at 9:06 pm on 02/15/2022 by text page via the [facility] messaging system. Electronically Signed   By: Logan Bores M.D.   On: 02/15/2022 21:07   CT ANGIO HEAD NECK W WO CM (CODE STROKE)  Result Date: 02/15/2022 CLINICAL DATA:  Neuro deficit, acute, stroke suspected. Left-sided deficits and slurred speech. EXAM: CT ANGIOGRAPHY HEAD AND NECK TECHNIQUE: Multidetector CT imaging of the head and neck was performed using the standard protocol during bolus administration of intravenous contrast. Multiplanar CT image reconstructions and MIPs were obtained to evaluate the vascular anatomy. Carotid stenosis measurements (when applicable) are obtained utilizing NASCET criteria, using the distal internal carotid diameter as the denominator. RADIATION DOSE REDUCTION: This exam was performed according to the departmental dose-optimization program which includes automated exposure control, adjustment of the mA and/or kV according to patient size and/or use of iterative reconstruction technique. CONTRAST:  11m OMNIPAQUE IOHEXOL 350 MG/ML SOLN COMPARISON:  None Available. FINDINGS: CTA NECK FINDINGS Aortic arch: Standard 3 vessel aortic arch with widely patent arch vessel origins. Right carotid system: Patent with minimal calcified and soft plaque at the carotid bifurcation. No evidence of a significant stenosis or dissection. Left carotid system: Patent with a small amount calcified plaque at the carotid bifurcation. No evidence of a significant stenosis or dissection. Vertebral arteries: Patent and codominant without evidence of a significant stenosis or dissection. Skeleton: Extensive prior posterior decompression in the cervical spine. Disc degeneration most advanced at C3-4. Other neck: No evidence of cervical lymphadenopathy or mass. Upper chest: Clear lung apices. Review of the MIP images confirms the above findings CTA HEAD FINDINGS Anterior circulation: The internal carotid arteries are  patent from skull  base to carotid termini with calcified plaque resulting in mild cavernous segment stenosis bilaterally. ACAs and MCAs are patent without evidence of a proximal branch occlusion. There is a mild proximal left M1 stenosis. No aneurysm is identified. Posterior circulation: The intracranial vertebral arteries are widely patent to the basilar. Patent bilateral PICA, right AICA, and bilateral SCA origins are visualized. The basilar artery is patent with mild irregularity but no significant stenosis. Both PCAs are patent without evidence of a significant proximal stenosis. No aneurysm is identified. Venous sinuses: As permitted by contrast timing, patent. Anatomic variants: None. Review of the MIP images confirms the above findings IMPRESSION: Mild atherosclerosis in the head and neck without a large vessel occlusion or flow limiting proximal stenosis. These results were communicated to Dr. Lorrin Goodell at 9:06 pm on 02/15/2022 by text page via the [facility] messaging system. Electronically Signed   By: Logan Bores M.D.   On: 02/15/2022 21:20    Microbiology: Recent Results (from the past 240 hour(s))  Culture, blood (Routine X 2) w Reflex to ID Panel     Status: None   Collection Time: 02/26/22 12:22 PM   Specimen: BLOOD RIGHT HAND  Result Value Ref Range Status   Specimen Description BLOOD RIGHT HAND  Final   Special Requests   Final    AEROBIC BOTTLE ONLY Blood Culture results may not be optimal due to an inadequate volume of blood received in culture bottles   Culture   Final    NO GROWTH 5 DAYS Performed at [facility], [place] [address], [place], [place] [address]    Report Status 03/03/2022 FINAL  Final  Culture, blood (Routine X 2) w Reflex to ID Panel     Status: None   Collection Time: 02/26/22 12:25 PM   Specimen: BLOOD RIGHT HAND  Result Value Ref Range Status   Specimen Description BLOOD RIGHT HAND  Final   Special Requests   Final    AEROBIC BOTTLE ONLY Blood Culture results may not be  optimal due to an inadequate volume of blood received in culture bottles   Culture   Final    NO GROWTH 5 DAYS Performed at [facility], [place] [address], [place], [place] [address]    Report Status 03/03/2022 FINAL  Final  Aerobic/Anaerobic Culture w Gram Stain (surgical/deep wound)     Status: None   Collection Time: 02/27/22  6:19 PM   Specimen: PATH Digit amputation; Tissue  Result Value Ref Range Status   Specimen Description BONE  Final   Special Requests RIGHT FIFTH TOE  Final   Gram Stain NO WBC SEEN NO ORGANISMS SEEN   Final   Culture   Final    RARE GROUP B STREP(S.AGALACTIAE)ISOLATED TESTING AGAINST S. AGALACTIAE NOT ROUTINELY PERFORMED DUE TO PREDICTABILITY OF AMP/PEN/VAN SUSCEPTIBILITY. RARE STAPHYLOCOCCUS AUREUS NO ANAEROBES ISOLATED Performed at [facility], [place] [address] [place], [place] [address]    Report Status 03/05/2022 FINAL  Final   Organism ID, Bacteria STAPHYLOCOCCUS AUREUS  Final      Susceptibility   Staphylococcus aureus - MIC*    CIPROFLOXACIN <=0.5 SENSITIVE Sensitive     ERYTHROMYCIN <=0.25 SENSITIVE Sensitive     GENTAMICIN <=0.5 SENSITIVE Sensitive     OXACILLIN <=0.25 SENSITIVE Sensitive     TETRACYCLINE <=1 SENSITIVE Sensitive     VANCOMYCIN 1 SENSITIVE Sensitive     TRIMETH/SULFA <=10 SENSITIVE Sensitive     CLINDAMYCIN <=0.25 SENSITIVE Sensitive  RIFAMPIN <=0.5 SENSITIVE Sensitive     Inducible Clindamycin NEGATIVE Sensitive     * RARE STAPHYLOCOCCUS AUREUS     Labs: Basic Metabolic Panel: Recent Labs  Lab 03/02/22 0157 03/05/22 0207 03/06/22 0116  NA 140 137 138  K 4.0 3.9 4.0  CL 109 108 109  CO2 _0 GLUCOSE 258* 189* 212*  BUN _1 CREATININE 1.01 0.85 0.82  CALCIUM 8.5* 8.5* 8.7*  MG  --  1.9 1.9   Liver Function Tests: No results for input(s): AST, ALT, ALKPHOS, BILITOT, PROT, ALBUMIN in the last 168 hours. No results for input(s): LIPASE, AMYLASE in the last 168 hours. No results for  input(s): AMMONIA in the last 168 hours. CBC: Recent Labs  Lab 03/02/22 0157 03/05/22 0207 03/06/22 0116  WBC 9.2 12.1* 9.3  NEUTROABS  --  8.2* 5.6  HGB 10.3* 11.3* 11.2*  HCT 30.9* 34.4* 33.9*  MCV 83.5 83.9 83.9  PLT 226 319 308   Cardiac Enzymes: No results for input(s): CKTOTAL, CKMB, CKMBINDEX, TROPONINI in the last 168 hours. BNP: BNP (last 3 results) Recent Labs    03/30/21 0958  BNP 67.6    ProBNP (last 3 results) No results for input(s): PROBNP in the last 8760 hours.  CBG: Recent Labs  Lab 03/06/22 1212 03/06/22 1545 03/06/22 2129 03/07/22 0614 03/07/22 1151  GLUCAP 184* 221* 203* 146* 176*       Signed:  Nita Sells MD   [facility] 03/07/2022, 2:11 PM

## 2022-03-07 NOTE — Progress Notes (Signed)
Physical Therapy Treatment Patient Details Name: Austin [name] MRN: [id number] DOB: 1956-02-17 Today's Date: 03/07/2022   History of Present Illness Pt is a 66 y.o. 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 Brain MRI negative for acute injury. S/p RLE arteriogram and angioplasty of external iliac artery on 5/17. S/p RLE tibial bypass graft on 5/22. 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 PMH includes PVD s/p L BKA (10/2018) with prosthetic, DM, DVT, HTN, HLD.    PT Comments    Pt received in supine (in wet bed) on bed pan, reports he was unable to have BM and defers transfer to [facility], reporting he does not need to go. Pt performed sit<>stand via Stedy with +2 modA and static standing in [place] frame for strengthening. Pt unable to perform posterior/peri-care unassisted in seated/standing postures and needs +2 physical assist for static/dynamic standing tasks. Pt up in chair and reviewed seated UE/LE exercises with teachback method. Pt continues to benefit from PT services to progress toward functional mobility goals.   Recommendations for follow up therapy are one component of a multi-disciplinary discharge planning process, led by the attending physician.  Recommendations may be updated based on patient status, additional functional criteria and insurance authorization.  Follow Up Recommendations  Acute inpatient rehab (3hours/day)     Assistance Recommended at Discharge Intermittent Supervision/Assistance  Patient can return home with the following Two people to help with walking and/or transfers;A lot of help with bathing/dressing/bathroom;Assistance with cooking/housework;Assist for transportation;Help with stairs or ramp for entrance   Equipment Recommendations  None recommended by PT (pending progression of mobility)    Recommendations for Other Services Rehab consult     Precautions / Restrictions  Precautions Precautions: Fall;Other (comment) Precaution Comments: H/o L BKA (prosthetic in room) Required Braces or Orthoses: Other Brace Other Brace: RLE darco shoe (in room) Restrictions Weight Bearing Restrictions: Yes RLE Weight Bearing: Partial weight bearing Other Position/Activity Restrictions: WB through R heel for transfers only per podiatry     Mobility  Bed Mobility Overal bed mobility: Needs Assistance Bed Mobility: Supine to Sit     Supine to sit: Min assist, HOB elevated     General bed mobility comments: required assistance raising trunk, pt reliant on bed rail and HOB very elevated while performing    Transfers Overall transfer level: Needs assistance Equipment used: Rolling walker (2 wheels), Ambulation equipment used Transfers: Sit to/from Stand, Bed to chair/wheelchair/BSC Sit to Stand: Mod assist, +2 safety/equipment, +2 physical assistance, From elevated surface           General transfer comment: stood from EOB to [place] with +2 modA from elevated bed height, mod cues for safe UE placement and use of momentum required; totalA to transfer to chair from bed with [facility] for safety.           Balance Overall balance assessment: Needs assistance Sitting-balance support: Single extremity supported, No upper extremity supported Sitting balance-Leahy Scale: Fair Sitting balance - Comments: able to don L BKA prosthetic with assist for set-up; requires assist to don R darco shoe   Standing balance support: Bilateral upper extremity supported, Reliant on assistive device for balance Standing balance-Leahy Scale: Poor Standing balance comment: Pt able to stand ~3 mins in Stedy frame for posterior/peri-care due to wet bed, fair compliance with RLE heel WB in [place];  Cognition Arousal/Alertness: Awake/alert Behavior During Therapy: WFL for tasks assessed/performed Overall Cognitive Status: No family/caregiver present to  determine baseline cognitive functioning Area of Impairment: Attention, Safety/judgement, Awareness, Problem solving, Following commands                   Current Attention Level: Selective   Following Commands: Follows multi-step commands with increased time Safety/Judgement: Decreased awareness of safety, Decreased awareness of deficits Awareness: Emergent Problem Solving: Slow processing, Requires verbal cues General Comments: difficulty maintaining WB precautions despite instructions, pt in wet bed upon therapist arrival to room (purewick on but had leaked?) but also sitting on bed pan with nothing in it.        Exercises General Exercises - Lower Extremity Ankle Circles/Pumps: AROM, Right, Seated Other Exercises Other Exercises: IS x 10 reps (500-700 mL) Other Exercises: chair push-ups x10 reps (short rest break after 5 reps) using BLE for assist Other Exercises: static standing in Stedy frame 3 mins for BLE strengthening    General Comments General comments (skin integrity, edema, etc.): HR to 117 bpm with exertion      Pertinent Vitals/Pain Pain Assessment Pain Assessment: Faces Faces Pain Scale: Hurts little more Pain Location: RLE incisions and left residual limb, bottom discomfort from wet bed Pain Descriptors / Indicators: Sore Pain Intervention(s): Monitored during session, Repositioned, Other (comment) (pt wet bed linens removed and new gown placed)     PT Goals (current goals can now be found in the care plan section) Acute Rehab PT Goals Patient Stated Goal: To return home and be independent in getting around and caring for myself. PT Goal Formulation: With patient Time For Goal Achievement: 03/19/22 Progress towards PT goals: Progressing toward goals    Frequency    Min 3X/week      PT Plan Current plan remains appropriate       AM-PAC PT "6 Clicks" Mobility   Outcome Measure  Help needed turning from your back to your side while in a  flat bed without using bedrails?: A Little Help needed moving from lying on your back to sitting on the side of a flat bed without using bedrails?: A Lot (from flat bed no rails; only a little from hospital bed) Help needed moving to and from a bed to a chair (including a wheelchair)?: A Lot (scooting with mod cues) Help needed standing up from a chair using your arms (e.g., wheelchair or bedside chair)?: Total (+2 maxA) Help needed to walk in hospital room?: Total Help needed climbing 3-5 steps with a railing? : Total 6 Click Score: 10    End of Session Equipment Utilized During Treatment: Gait belt Activity Tolerance: Patient tolerated treatment well Patient left: in chair;with call bell/phone within reach;with chair alarm set;Other (comment) (heels floated with pillow pt encouraged to maintain R knee neutral position) Nurse Communication: Mobility status;Need for lift equipment;Other (comment) (use SB scoot vs +2 A and Stedy for return to bed from chair.) PT Visit Diagnosis: Other abnormalities of gait and mobility (R26.89);Pain Pain - Right/Left: Right Pain - part of body: Ankle and joints of foot     Time: 1535-1605 PT Time Calculation (min) (ACUTE ONLY): 30 min  Charges:  $Therapeutic Exercise: 8-22 mins $Therapeutic Activity: 8-22 mins                     Dafina Suk P., PTA Acute Rehabilitation Services Secure Chat Preferred 9a-5:30pm Office: [phone]    Dorathy Kinsman [facility] 03/07/2022, 6:46 PM

## 2022-03-07 NOTE — Progress Notes (Signed)
Patient stabilized for discharge awaiting CIR authorization likely can transition to CIR tomorrow 6/2 if bed available -transition to Augmentin today and see if any fever curve Dr. Loreta Ave has been consulted and he will see the wound and cleared the patient for discharge tomorrow to CIR if there is a bed

## 2022-03-07 NOTE — Progress Notes (Signed)
Pharmacy Antibiotic Note  Austin [name] is a 66 y.o. male admitted on 02/15/2022 with sepsis and concern for osteomyelitis 2/2 to diabetic foot ulcer. Now s/p amputation on 5/24 with cultures growing rare group B strep and staph aureus. Pharmacy has been consulted to dose unasyn, vancomycin off. Cr has remained stable, planning Unasyn for full 4 weeks.  Plan: Continue Unasyn 3g q6h  F/u renal function   Height: 6\' 4"  (193 cm) Weight: 100.7 kg (222 lb 0.1 oz) IBW/kg (Calculated) : 86.8  Temp (24hrs), Avg:97.5 F (36.4 C), Min:97.3 F (36.3 C), Max:98.1 F (36.7 C)  Recent Labs  Lab 02/28/22 2224 03/01/22 0159 03/02/22 0157 03/05/22 0207 03/06/22 0116  WBC  --   --  9.2 12.1* 9.3  CREATININE  --   --  1.01 0.85 0.82  VANCOTROUGH  --  15  --   --   --   VANCOPEAK 17*  --   --   --   --      Estimated Creatinine Clearance: 108.8 mL/min (by C-G formula based on SCr of 0.82 mg/dL).    No Known Allergies  Antimicrobials this admission: Vancomycin 5/12 >> 5/26, 5/28 > 5/30 Cefepime 5/13 >>5/17  Ceftriaxone 5/12 x 1, 5/17 > 5/27 Flagyl 5/13 x 1, 5/17> 5/27 Unasyn 5/27 >  Microbiology results: 5/12 BCx: ngF 5/23 BCx: ngtd 5/24 R fifth toe tissue: rare group B strep and staph aureus  Thank you for allowing pharmacy to be a part of this patient's care.   6/24, PharmD, BCPS, [facility] Clinical Pharmacist [phone] Please check AMION for all [facility] numbers 03/07/2022

## 2022-03-08 ENCOUNTER — Encounter (HOSPITAL_COMMUNITY): Payer: Self-pay | Admitting: Physical Medicine and Rehabilitation

## 2022-03-08 ENCOUNTER — Inpatient Hospital Stay (HOSPITAL_COMMUNITY)
Admission: RE | Admit: 2022-03-08 | Discharge: 2022-03-21 | DRG: 560 | Disposition: A | Discharge status: Home Health | Payer: Medicare Other | Source: Intra-hospital | Attending: Physical Medicine and Rehabilitation | Admitting: Physical Medicine and Rehabilitation

## 2022-03-08 ENCOUNTER — Other Ambulatory Visit: Payer: Self-pay

## 2022-03-08 DIAGNOSIS — L97209 Non-pressure chronic ulcer of unspecified calf with unspecified severity: Secondary | ICD-10-CM | POA: Diagnosis present

## 2022-03-08 DIAGNOSIS — D62 Acute posthemorrhagic anemia: Secondary | ICD-10-CM

## 2022-03-08 DIAGNOSIS — K219 Gastro-esophageal reflux disease without esophagitis: Secondary | ICD-10-CM | POA: Diagnosis present

## 2022-03-08 DIAGNOSIS — I739 Peripheral vascular disease, unspecified: Secondary | ICD-10-CM | POA: Diagnosis not present

## 2022-03-08 DIAGNOSIS — E11622 Type 2 diabetes mellitus with other skin ulcer: Secondary | ICD-10-CM | POA: Diagnosis present

## 2022-03-08 DIAGNOSIS — L97829 Non-pressure chronic ulcer of other part of left lower leg with unspecified severity: Secondary | ICD-10-CM | POA: Diagnosis present

## 2022-03-08 DIAGNOSIS — I10 Essential (primary) hypertension: Secondary | ICD-10-CM | POA: Diagnosis present

## 2022-03-08 DIAGNOSIS — Z89439 Acquired absence of unspecified foot: Secondary | ICD-10-CM

## 2022-03-08 DIAGNOSIS — Z833 Family history of diabetes mellitus: Secondary | ICD-10-CM | POA: Diagnosis not present

## 2022-03-08 DIAGNOSIS — Z79899 Other long term (current) drug therapy: Secondary | ICD-10-CM

## 2022-03-08 DIAGNOSIS — Z7902 Long term (current) use of antithrombotics/antiplatelets: Secondary | ICD-10-CM | POA: Diagnosis not present

## 2022-03-08 DIAGNOSIS — Z807 Family history of other malignant neoplasms of lymphoid, hematopoietic and related tissues: Secondary | ICD-10-CM | POA: Diagnosis not present

## 2022-03-08 DIAGNOSIS — Z794 Long term (current) use of insulin: Secondary | ICD-10-CM

## 2022-03-08 DIAGNOSIS — I482 Chronic atrial fibrillation, unspecified: Secondary | ICD-10-CM | POA: Diagnosis present

## 2022-03-08 DIAGNOSIS — E559 Vitamin D deficiency, unspecified: Secondary | ICD-10-CM | POA: Diagnosis present

## 2022-03-08 DIAGNOSIS — Z7982 Long term (current) use of aspirin: Secondary | ICD-10-CM

## 2022-03-08 DIAGNOSIS — Z4781 Encounter for orthopedic aftercare following surgical amputation: Secondary | ICD-10-CM | POA: Diagnosis present

## 2022-03-08 DIAGNOSIS — Z1321 Encounter for screening for nutritional disorder: Secondary | ICD-10-CM | POA: Diagnosis not present

## 2022-03-08 DIAGNOSIS — I2510 Atherosclerotic heart disease of native coronary artery without angina pectoris: Secondary | ICD-10-CM | POA: Diagnosis present

## 2022-03-08 DIAGNOSIS — Z87891 Personal history of nicotine dependence: Secondary | ICD-10-CM

## 2022-03-08 DIAGNOSIS — Z89512 Acquired absence of left leg below knee: Secondary | ICD-10-CM | POA: Diagnosis not present

## 2022-03-08 DIAGNOSIS — E1142 Type 2 diabetes mellitus with diabetic polyneuropathy: Secondary | ICD-10-CM | POA: Diagnosis present

## 2022-03-08 DIAGNOSIS — Z86718 Personal history of other venous thrombosis and embolism: Secondary | ICD-10-CM

## 2022-03-08 DIAGNOSIS — E785 Hyperlipidemia, unspecified: Secondary | ICD-10-CM | POA: Diagnosis present

## 2022-03-08 DIAGNOSIS — E11621 Type 2 diabetes mellitus with foot ulcer: Secondary | ICD-10-CM | POA: Diagnosis not present

## 2022-03-08 DIAGNOSIS — Z89431 Acquired absence of right foot: Secondary | ICD-10-CM | POA: Diagnosis not present

## 2022-03-08 DIAGNOSIS — Z8249 Family history of ischemic heart disease and other diseases of the circulatory system: Secondary | ICD-10-CM | POA: Diagnosis not present

## 2022-03-08 DIAGNOSIS — L97401 Non-pressure chronic ulcer of unspecified heel and midfoot limited to breakdown of skin: Secondary | ICD-10-CM

## 2022-03-08 DIAGNOSIS — E1151 Type 2 diabetes mellitus with diabetic peripheral angiopathy without gangrene: Secondary | ICD-10-CM | POA: Diagnosis present

## 2022-03-08 DIAGNOSIS — I4891 Unspecified atrial fibrillation: Secondary | ICD-10-CM | POA: Diagnosis not present

## 2022-03-08 DIAGNOSIS — M86171 Other acute osteomyelitis, right ankle and foot: Secondary | ICD-10-CM | POA: Diagnosis present

## 2022-03-08 DIAGNOSIS — M62838 Other muscle spasm: Secondary | ICD-10-CM | POA: Diagnosis not present

## 2022-03-08 DIAGNOSIS — E1169 Type 2 diabetes mellitus with other specified complication: Secondary | ICD-10-CM | POA: Diagnosis not present

## 2022-03-08 DIAGNOSIS — E13621 Other specified diabetes mellitus with foot ulcer: Secondary | ICD-10-CM

## 2022-03-08 DIAGNOSIS — Z89421 Acquired absence of other right toe(s): Secondary | ICD-10-CM | POA: Diagnosis present

## 2022-03-08 DIAGNOSIS — Z7901 Long term (current) use of anticoagulants: Secondary | ICD-10-CM

## 2022-03-08 DIAGNOSIS — R4182 Altered mental status, unspecified: Secondary | ICD-10-CM

## 2022-03-08 DIAGNOSIS — E1110 Type 2 diabetes mellitus with ketoacidosis without coma: Secondary | ICD-10-CM | POA: Diagnosis not present

## 2022-03-08 DIAGNOSIS — E669 Obesity, unspecified: Secondary | ICD-10-CM | POA: Diagnosis not present

## 2022-03-08 DIAGNOSIS — M79671 Pain in right foot: Secondary | ICD-10-CM | POA: Diagnosis not present

## 2022-03-08 DIAGNOSIS — L97509 Non-pressure chronic ulcer of other part of unspecified foot with unspecified severity: Secondary | ICD-10-CM

## 2022-03-08 LAB — GLUCOSE, CAPILLARY
Glucose-Capillary: 131 mg/dL — ABNORMAL HIGH (ref 70–99)
Glucose-Capillary: 255 mg/dL — ABNORMAL HIGH (ref 70–99)
Glucose-Capillary: 224 mg/dL — ABNORMAL HIGH (ref 70–99)
Glucose-Capillary: 169 mg/dL — ABNORMAL HIGH (ref 70–99)

## 2022-03-08 MED ORDER — JUVEN PO PACK
1.0000 | PACK | Freq: Two times a day (BID) | ORAL | Status: DC
  Administered 2022-03-09 – 2022-03-21 (×26): 1 via ORAL
  Filled 2022-03-08 (×25): qty 1

## 2022-03-08 MED ORDER — DIPHENHYDRAMINE HCL 12.5 MG/5ML PO ELIX: 12.5000 mg | ORAL_SOLUTION | Freq: Four times a day (QID) | ORAL | Status: DC | PRN

## 2022-03-08 MED ORDER — INSULIN GLARGINE-YFGN 100 UNIT/ML ~~LOC~~ SOLN
30.0000 [IU] | Freq: Two times a day (BID) | SUBCUTANEOUS | Status: DC
Start: 2022-03-08 — End: 2022-03-09
  Administered 2022-03-08 – 2022-03-09 (×2): 30 [IU] via SUBCUTANEOUS
  Filled 2022-03-08 (×3): qty 0.3

## 2022-03-08 MED ORDER — ACETAMINOPHEN 325 MG PO TABS
325.0000 mg | ORAL_TABLET | ORAL | Status: DC | PRN
  Administered 2022-03-10 – 2022-03-21 (×10): 650 mg via ORAL
  Filled 2022-03-08 (×10): qty 2

## 2022-03-08 MED ORDER — OXYCODONE HCL 5 MG PO TABS
5.0000 mg | ORAL_TABLET | ORAL | Status: DC | PRN
  Administered 2022-03-08 – 2022-03-21 (×14): 5 mg via ORAL
  Filled 2022-03-08 (×14): qty 1

## 2022-03-08 MED ORDER — PHENOL 1.4 % MT LIQD: 1.0000 | OROMUCOSAL | Status: DC | PRN

## 2022-03-08 MED ORDER — SENNOSIDES-DOCUSATE SODIUM 8.6-50 MG PO TABS: 1.0000 | ORAL_TABLET | Freq: Every evening | ORAL | Status: DC | PRN

## 2022-03-08 MED ORDER — BISACODYL 10 MG RE SUPP: 10.0000 mg | Freq: Every day | RECTAL | Status: DC | PRN

## 2022-03-08 MED ORDER — GUAIFENESIN-DM 100-10 MG/5ML PO SYRP
5.0000 mL | ORAL_SOLUTION | Freq: Four times a day (QID) | ORAL | Status: DC | PRN
  Administered 2022-03-10 – 2022-03-13 (×4): 10 mL via ORAL
  Filled 2022-03-08 (×4): qty 10

## 2022-03-08 MED ORDER — POLYETHYLENE GLYCOL 3350 17 G PO PACK: 17.0000 g | PACK | Freq: Every day | ORAL | Status: DC | PRN

## 2022-03-08 MED ORDER — POLYETHYLENE GLYCOL 3350 17 G PO PACK
17.0000 g | PACK | Freq: Every day | ORAL | Status: DC
  Administered 2022-03-09 – 2022-03-21 (×10): 17 g via ORAL
  Filled 2022-03-08 (×12): qty 1

## 2022-03-08 MED ORDER — PANTOPRAZOLE SODIUM 40 MG PO TBEC
40.0000 mg | DELAYED_RELEASE_TABLET | Freq: Every day | ORAL | Status: DC
  Administered 2022-03-09 – 2022-03-21 (×13): 40 mg via ORAL
  Filled 2022-03-08 (×13): qty 1

## 2022-03-08 MED ORDER — ATORVASTATIN CALCIUM 80 MG PO TABS
80.0000 mg | ORAL_TABLET | Freq: Every day | ORAL | Status: DC
  Administered 2022-03-08 – 2022-03-21 (×14): 80 mg via ORAL
  Filled 2022-03-08 (×14): qty 1

## 2022-03-08 MED ORDER — INSULIN ASPART 100 UNIT/ML IJ SOLN: 0.0000 [IU] | Freq: Every day | INTRAMUSCULAR | 11 refills | Status: DC

## 2022-03-08 MED ORDER — CLOPIDOGREL BISULFATE 75 MG PO TABS
75.0000 mg | ORAL_TABLET | Freq: Every day | ORAL | Status: DC
  Administered 2022-03-09 – 2022-03-21 (×13): 75 mg via ORAL
  Filled 2022-03-08 (×14): qty 1

## 2022-03-08 MED ORDER — CLOPIDOGREL BISULFATE 75 MG PO TABS: 75.0000 mg | ORAL_TABLET | Freq: Every day | ORAL | Status: DC

## 2022-03-08 MED ORDER — INSULIN ASPART 100 UNIT/ML IJ SOLN
0.0000 [IU] | Freq: Three times a day (TID) | INTRAMUSCULAR | Status: DC
  Administered 2022-03-08: 5 [IU] via SUBCUTANEOUS
  Administered 2022-03-09 – 2022-03-11 (×7): 3 [IU] via SUBCUTANEOUS
  Administered 2022-03-11 (×2): 5 [IU] via SUBCUTANEOUS
  Administered 2022-03-12: 3 [IU] via SUBCUTANEOUS
  Administered 2022-03-12: 2 [IU] via SUBCUTANEOUS
  Administered 2022-03-12 – 2022-03-13 (×2): 5 [IU] via SUBCUTANEOUS
  Administered 2022-03-13 – 2022-03-16 (×8): 3 [IU] via SUBCUTANEOUS
  Administered 2022-03-16: 2 [IU] via SUBCUTANEOUS
  Administered 2022-03-16: 5 [IU] via SUBCUTANEOUS
  Administered 2022-03-17 (×2): 2 [IU] via SUBCUTANEOUS
  Administered 2022-03-17 – 2022-03-18 (×3): 3 [IU] via SUBCUTANEOUS
  Administered 2022-03-18 – 2022-03-19 (×2): 2 [IU] via SUBCUTANEOUS
  Administered 2022-03-19 – 2022-03-21 (×6): 3 [IU] via SUBCUTANEOUS
  Administered 2022-03-21: 5 [IU] via SUBCUTANEOUS

## 2022-03-08 MED ORDER — ZINC SULFATE 220 (50 ZN) MG PO CAPS
220.0000 mg | ORAL_CAPSULE | Freq: Every day | ORAL | Status: DC
  Administered 2022-03-08 – 2022-03-21 (×14): 220 mg via ORAL
  Filled 2022-03-08 (×14): qty 1

## 2022-03-08 MED ORDER — INSULIN ASPART 100 UNIT/ML IJ SOLN
0.0000 [IU] | Freq: Every day | INTRAMUSCULAR | Status: DC
  Administered 2022-03-09 – 2022-03-10 (×2): 2 [IU] via SUBCUTANEOUS
  Administered 2022-03-14: 3 [IU] via SUBCUTANEOUS
  Administered 2022-03-15: 2 [IU] via SUBCUTANEOUS
  Administered 2022-03-18: 4 [IU] via SUBCUTANEOUS

## 2022-03-08 MED ORDER — PROCHLORPERAZINE EDISYLATE 10 MG/2ML IJ SOLN: 5.0000 mg | Freq: Four times a day (QID) | INTRAMUSCULAR | Status: DC | PRN

## 2022-03-08 MED ORDER — APIXABAN 5 MG PO TABS
5.0000 mg | ORAL_TABLET | Freq: Two times a day (BID) | ORAL | Status: DC
  Administered 2022-03-08 – 2022-03-21 (×26): 5 mg via ORAL
  Filled 2022-03-08 (×26): qty 1

## 2022-03-08 MED ORDER — ASCORBIC ACID 500 MG PO TABS
500.0000 mg | ORAL_TABLET | Freq: Two times a day (BID) | ORAL | Status: DC
  Administered 2022-03-08 – 2022-03-21 (×26): 500 mg via ORAL
  Filled 2022-03-08 (×26): qty 1

## 2022-03-08 MED ORDER — INSULIN ASPART 100 UNIT/ML IJ SOLN: 10.0000 [IU] | Freq: Three times a day (TID) | INTRAMUSCULAR | 11 refills | Status: DC

## 2022-03-08 MED ORDER — PROCHLORPERAZINE 25 MG RE SUPP: 12.5000 mg | Freq: Four times a day (QID) | RECTAL | Status: DC | PRN

## 2022-03-08 MED ORDER — SALINE SPRAY 0.65 % NA SOLN: 1.0000 | NASAL | Status: DC | PRN

## 2022-03-08 MED ORDER — PROCHLORPERAZINE MALEATE 5 MG PO TABS: 5.0000 mg | ORAL_TABLET | Freq: Four times a day (QID) | ORAL | Status: DC | PRN

## 2022-03-08 MED ORDER — IPRATROPIUM-ALBUTEROL 0.5-2.5 (3) MG/3ML IN SOLN: 3.0000 mL | RESPIRATORY_TRACT | Status: DC | PRN

## 2022-03-08 MED ORDER — INSULIN ASPART 100 UNIT/ML IJ SOLN
10.0000 [IU] | Freq: Three times a day (TID) | INTRAMUSCULAR | Status: DC
  Administered 2022-03-08 – 2022-03-20 (×32): 10 [IU] via SUBCUTANEOUS

## 2022-03-08 MED ORDER — ALUM & MAG HYDROXIDE-SIMETH 200-200-20 MG/5ML PO SUSP: 30.0000 mL | ORAL | Status: DC | PRN

## 2022-03-08 MED ORDER — INSULIN ASPART 100 UNIT/ML IJ SOLN: 0.0000 [IU] | Freq: Three times a day (TID) | INTRAMUSCULAR | 11 refills | Status: DC

## 2022-03-08 MED ORDER — AMOXICILLIN-POT CLAVULANATE 875-125 MG PO TABS
1.0000 | ORAL_TABLET | Freq: Two times a day (BID) | ORAL | Status: DC
  Administered 2022-03-08 – 2022-03-21 (×26): 1 via ORAL
  Filled 2022-03-08 (×29): qty 1

## 2022-03-08 MED ORDER — LOSARTAN POTASSIUM 25 MG PO TABS
25.0000 mg | ORAL_TABLET | Freq: Every day | ORAL | Status: DC
  Administered 2022-03-09 – 2022-03-21 (×13): 25 mg via ORAL
  Filled 2022-03-08 (×13): qty 1

## 2022-03-08 MED ORDER — TRAZODONE HCL 50 MG PO TABS: 25.0000 mg | ORAL_TABLET | Freq: Every evening | ORAL | Status: DC | PRN

## 2022-03-08 MED ORDER — FLEET ENEMA 7-19 GM/118ML RE ENEM
1.0000 | ENEMA | Freq: Once | RECTAL | Status: DC | PRN
Start: 2022-03-08 — End: 2022-03-21

## 2022-03-08 MED ORDER — RISAQUAD PO CAPS
1.0000 | ORAL_CAPSULE | Freq: Every day | ORAL | Status: DC
  Administered 2022-03-08 – 2022-03-21 (×14): 1 via ORAL
  Filled 2022-03-08 (×14): qty 1

## 2022-03-08 MED ORDER — ASPIRIN 81 MG PO TBEC
81.0000 mg | DELAYED_RELEASE_TABLET | Freq: Every day | ORAL | Status: DC
  Administered 2022-03-09 – 2022-03-21 (×13): 81 mg via ORAL
  Filled 2022-03-08 (×13): qty 1

## 2022-03-08 MED ORDER — MAGNESIUM GLUCONATE 500 MG PO TABS
250.0000 mg | ORAL_TABLET | Freq: Every day | ORAL | Status: DC
  Administered 2022-03-08 – 2022-03-21 (×14): 250 mg via ORAL
  Filled 2022-03-08 (×14): qty 1

## 2022-03-08 NOTE — Progress Notes (Addendum)
  Progress Note    03/08/2022 7:58 AM 9 Days Post-Op  Subjective: no complaints   Vitals:   03/08/22 0025 03/08/22 0401  BP: 137/76 140/75  Pulse: 76 71  Resp: 20 18  Temp: 97.6 F (36.4 C) (!) 97.2 F (36.2 C)  SpO2: 98% 99%   Physical Exam: Cardiac:  regular Lungs:  non labored Incisions:  right groin and right leg incisions are c/d/I and healing well Extremities:  well perfused and warm. Doppler AT signal. Foot dressed Abdomen:  soft Neurologic: alert and oriented  CBC    Component Value Date/Time   WBC 9.3 03/06/2022 0116   RBC 4.04 (L) 03/06/2022 0116   HGB 11.2 (L) 03/06/2022 0116   HGB 14.1 11/17/2013 0621   HCT 33.9 (L) 03/06/2022 0116   HCT 41.0 02/16/2022 0608   PLT 308 03/06/2022 0116   PLT 218 11/17/2013 0621   MCV 83.9 03/06/2022 0116   MCV 86 11/17/2013 0621   MCH 27.7 03/06/2022 0116   MCHC 33.0 03/06/2022 0116   RDW 15.5 03/06/2022 0116   RDW 13.6 11/17/2013 0621   LYMPHSABS 2.6 03/06/2022 0116   LYMPHSABS 2.4 11/17/2013 0621   MONOABS 0.9 03/06/2022 0116   MONOABS 0.9 11/17/2013 0621   EOSABS 0.1 03/06/2022 0116   EOSABS 0.1 11/17/2013 0621   BASOSABS 0.0 03/06/2022 0116   BASOSABS 0.0 11/17/2013 0621    BMET    Component Value Date/Time   NA 138 03/06/2022 0116   NA 135 (L) 11/17/2013 0621   K 4.0 03/06/2022 0116   K 4.3 11/17/2013 0621   CL 109 03/06/2022 0116   CL 99 11/17/2013 0621   CO2 25 03/06/2022 0116   CO2 31 11/17/2013 0621   GLUCOSE 212 (H) 03/06/2022 0116   GLUCOSE 172 (H) 11/17/2013 0621   BUN 10 03/06/2022 0116   BUN 10 11/17/2013 0621   CREATININE 0.82 03/06/2022 0116   CREATININE 0.85 11/17/2013 0621   CALCIUM 8.7 (L) 03/06/2022 0116   CALCIUM 9.6 11/17/2013 0621   GFRNONAA >60 03/06/2022 0116   GFRNONAA >60 11/17/2013 0621   GFRAA >60 01/10/2020 0808   GFRAA >60 11/17/2013 0621    INR    Component Value Date/Time   INR 1.3 (H) 02/15/2022 2051     Intake/Output Summary (Last 24 hours) at 03/08/2022  0758 Last data filed at 03/08/2022 0402 Gross per 24 hour  Intake 720 ml  Output 2500 ml  Net -1780 ml     Assessment/Plan:  66 y.o. male is s/p Right common femoral to TPT bypass with vein 11 Days Post Op and partial 5th ray amputation of right foot by Dr. Logan Bores  9 Days Post-Op   RLE incisions are c/d/I and healing well. Doppler AT signal in right foot Remains on Unasyn and Vanc Foot care per podiatry D/c plan for CIR Okay for d/c from vascular standpoint. Has follow up arranged on 6/20   Graceann Congress, PA-C [facility] [phone] 03/08/2022 7:58 AM  I have seen and evaluated the patient. I agree with the PA note as documented above.  Status post right common femoral to TP trunk bypass with ipsilateral nonreversed saphenous vein several weeks ago for CLI with tissue loss.  Has an excellent palpable pulse in the bypass graft tunneled through the saphenectomy incisions.  All of his incisions are healing.  Cephus Shelling, MD [facility] [place] Office: [phone]

## 2022-03-08 NOTE — Progress Notes (Signed)
Inpatient Rehab Admissions Coordinator:   I have a bed for pt to admit to CIR today.  D/C order and summary in.  I will update pt at bedside.  TOC aware.   Estill Dooms, PT, DPT Admissions Coordinator [phone] 03/08/22  9:58 AM

## 2022-03-08 NOTE — H&P (Signed)
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 CTA head/neck was negative for LVO.  MRI brain negative for acute changes and showed moderately advanced chronic changes with small remote left cerebellar infarct.  He was started on broad spectrum antibiotics and podiatry/Dr.Stover as well as Dr. Carlis Abbott were consulted for input as patient not in favor of losing his leg.   He underwent aortogram with R-external iliac artery angioplasty and stent placement on 05/17 followed by right CFA to tibioperoneal trunk bypass with ipsilateral nonreversed great saphenous vein on 05/22 in attempts at limb salvage.  He underwent partial 5th ray amputation of right foot by Dr. Amalia Hailey on 05/24 and to be minimally WB on RLE for transfers only  ID following for input on antibiotic regimen--surgical wound culture positive for rare Group B strep agalactiae and rare Staph aureus. Antibiotics narrowed to Augmentin X 4 weeks with end date of 06/21 and ID follow up at discharge.  PT/OT has been working with patient who continues to be limited by weakness requiring STEDY for standing attempts/WB precautions. CIR recommended due to functional decline. Currently complains of weakness.   Review of Systems  Constitutional:  Negative for chills and fever.  HENT:  Negative for hearing loss.   Eyes:  Negative for blurred vision.  Respiratory:  Negative for cough and sputum production.   Cardiovascular:  Negative for chest  pain and palpitations.  Gastrointestinal:  Negative for heartburn.  Genitourinary:  Negative for frequency and hematuria.  Musculoskeletal:  Negative for myalgias.  Skin:  Negative for rash.  Neurological:  Positive for sensory change and weakness. Negative for dizziness and headaches.  Psychiatric/Behavioral:  The patient does not have insomnia.     Past Medical History:  Diagnosis Date   Coronary artery disease    Diabetes ([name])    DVT (deep venous thrombosis) ([facility])    Gangrene of left foot ([name]) 02/09/2018   GERD (gastroesophageal reflux disease)    Hyperlipidemia    Hypertension    Peripheral vascular disease ([place])     Past Surgical History:  Procedure Laterality Date   ABDOMINAL AORTOGRAM W/LOWER EXTREMITY N/A 02/20/2022   Procedure: ABDOMINAL AORTOGRAM W/LOWER EXTREMITY;  Surgeon: Marty Heck, MD;  Location: [facility];  Service: Cardiovascular;  Laterality: N/A;   AMPUTATION Left 10/28/2018   Procedure: AMPUTATION BELOW KNEE;  Surgeon: Algernon Huxley, MD;  Location: [facility];  Service: Vascular;  Laterality: Left;   AMPUTATION Left 09/10/2021   Procedure: AMPUTATION DIGIT;  Surgeon: Cindra Presume, MD;  Location: [facility];  Service: Plastics;  Laterality: Left;   AMPUTATION Left 09/14/2021   Procedure: AMPUTATION DIGIT left long finger and irrigation and debridement;  Surgeon: Cindra Presume, MD;  Location: [facility];  Service: Plastics;  Laterality: Left;   AMPUTATION Right 02/27/2022   Procedure: RIGHT PARTIAL AMPUTATION FOOT;  Surgeon: Edrick Kins, DPM;  Location: [facility];  Service: Podiatry;  Laterality: Right;   AMPUTATION TOE Left 02/10/2018  Procedure: AMPUTATION TOE;  Surgeon: Sharlotte Alamo, DPM;  Location: [facility];  Service: Podiatry;  Laterality: Left;   APPLICATION OF WOUND VAC Left 10/16/2018   Procedure: APPLICATION OF WOUND VAC;  Surgeon: Samara Deist, DPM;  Location: [facility];  Service: Podiatry;  Laterality: Left;   DORSAL SLIT N/A 01/12/2021    Procedure: DORSAL SLIT;  Surgeon: Billey Co, MD;  Location: [facility];  Service: Urology;  Laterality: N/A;   FEMORAL-TIBIAL BYPASS GRAFT Right 02/25/2022   Procedure: RIGHT LOWER EXTREMITY FEMORAL TO TIBIAL PERONEAL TRUNK BYPASS;  Surgeon: Marty Heck, MD;  Location: [facility];  Service: Vascular;  Laterality: Right;  INSERT ARTERIAL LINE   groin surgery     IRRIGATION AND DEBRIDEMENT FOOT Left 09/30/2018   Procedure: IRRIGATION AND DEBRIDEMENT FOOT;  Surgeon: Sharlotte Alamo, DPM;  Location: [facility];  Service: Podiatry;  Laterality: Left;   LOWER EXTREMITY ANGIOGRAPHY Left 02/12/2018   Procedure: Lower Extremity Angiography;  Surgeon: Algernon Huxley, MD;  Location: [facility];  Service: Cardiovascular;  Laterality: Left;   LOWER EXTREMITY ANGIOGRAPHY Left 10/01/2018   Procedure: Lower Extremity Angiography;  Surgeon: Algernon Huxley, MD;  Location: [facility];  Service: Cardiovascular;  Laterality: Left;   LOWER EXTREMITY ANGIOGRAPHY Left 10/19/2018   Procedure: Lower Extremity Angiography;  Surgeon: Algernon Huxley, MD;  Location: [facility];  Service: Cardiovascular;  Laterality: Left;   LOWER EXTREMITY ANGIOGRAPHY Left 10/20/2018   Procedure: LOWER EXTREMITY ANGIOGRAPHY;  Surgeon: Algernon Huxley, MD;  Location: [place] [facility];  Service: Cardiovascular;  Laterality: Left;   LOWER EXTREMITY ANGIOGRAPHY Right 11/25/2018   Procedure: LOWER EXTREMITY ANGIOGRAPHY;  Surgeon: Algernon Huxley, MD;  Location: [facility];  Service: Cardiovascular;  Laterality: Right;   LOWER EXTREMITY ANGIOGRAPHY Right 01/10/2020   Procedure: LOWER EXTREMITY ANGIOGRAPHY;  Surgeon: Algernon Huxley, MD;  Location: [place] [facility];  Service: Cardiovascular;  Laterality: Right;   LOWER EXTREMITY ANGIOGRAPHY Right 12/11/2020   Procedure: LOWER EXTREMITY ANGIOGRAPHY;  Surgeon: Algernon Huxley, MD;  Location: [place];  Service: Cardiovascular;  Laterality: Right;   LOWER EXTREMITY  INTERVENTION  02/12/2018   Procedure: LOWER EXTREMITY INTERVENTION;  Surgeon: Algernon Huxley, MD;  Location: [facility] [facility];  Service: Cardiovascular;;   MINOR IRRIGATION AND DEBRIDEMENT OF WOUND Left 09/10/2021   Procedure: IRRIGATION AND DEBRIDEMENT OF LEFT LONG FINGER WOUND;  Surgeon: Cindra Presume, MD;  [facility];  Service: Plastics;  Laterality: Left;   NECK SURGERY     PERIPHERAL VASCULAR INTERVENTION  02/20/2022   Procedure: PERIPHERAL VASCULAR INTERVENTION;  Surgeon: Marty Heck, MD;  Location: [facility];  Service: Cardiovascular;;  right iliac   TRANSMETATARSAL AMPUTATION Left 10/16/2018   Procedure: TRANSMETATARSAL AMPUTATION LEFT FOOT;  Surgeon: Samara Deist, DPM;  Location: [facility];  Service: Podiatry;  Laterality: Left;   VEIN HARVEST Right 02/25/2022   Procedure: VEIN HARVEST OF RIGHT GREATER SAPHENOUS VEIN;  Surgeon: Marty Heck, MD;  Location: [facility];  Service: Vascular;  Laterality: Right;    Family History  Problem Relation Age of Onset   Diabetes Mother    Coronary artery disease Father    Hypertension Sister    Leukemia Brother     Social History:  Has been living with GF for past 2 years but plans on going to his mobile home in the future. He used Conservation officer, historic buildings in home PTA. Disabled since 75's due  to back injury--used to work as a Training and development officer at [employer]. Sisters help out. He  reports that he quit smoking about 14 months ago. His smoking use included cigarettes. He has a 47.00 pack-year smoking history. He has never used smokeless tobacco, does not drink alcohol and does not use drugs.   Allergies: No Known Allergies   Medications Prior to Admission  Medication Sig Dispense Refill   ACCU-CHEK GUIDE test strip 5 (five) times daily.     amoxicillin-clavulanate (AUGMENTIN) 875-125 MG tablet Take 1 tablet by mouth 2 (two) times daily for 20 days. 40 tablet 0   apixaban (ELIQUIS) 5 MG TABS tablet Take 1 tablet (5 mg total) by mouth  2 (two) times daily. 60 tablet O   aspirin EC 81 MG tablet Take 1 tablet (81 mg total) by mouth daily. 150 tablet 2   atorvastatin (LIPITOR) 80 MG tablet Take 1 tablet (80 mg total) by mouth daily at 6 PM. 30 tablet 0   Blood Glucose Monitoring Suppl (ACCU-CHEK GUIDE) w/Device KIT 5 (five) times daily.     clopidogrel (PLAVIX) 75 MG tablet Take 1 tablet (75 mg total) by mouth daily at 6 (six) AM for 23 days.     insulin aspart (NOVOLOG) 100 UNIT/ML injection Inject 0-15 Units into the skin 3 (three) times daily with meals. 10 mL 11   insulin aspart (NOVOLOG) 100 UNIT/ML injection Inject 0-5 Units into the skin at bedtime. 10 mL 11   insulin aspart (NOVOLOG) 100 UNIT/ML injection Inject 10 Units into the skin 3 (three) times daily with meals. 10 mL 11   insulin glargine-yfgn (SEMGLEE) 100 UNIT/ML injection Inject 0.3 mLs (30 Units total) into the skin 2 (two) times daily. 10 mL 11   losartan (COZAAR) 25 MG tablet Take 25 mg by mouth daily.     oxyCODONE (OXY IR/ROXICODONE) 5 MG immediate release tablet Take 1 tablet (5 mg total) by mouth every 4 (four) hours as needed for moderate pain or severe pain. 30 tablet 0   pantoprazole (PROTONIX) 40 MG tablet Take 1 tablet (40 mg total) by mouth daily.     traZODone (DESYREL) 50 MG tablet Take 1 tablet (50 mg total) by mouth at bedtime as needed for sleep.     TRUEPLUS 5-BEVEL PEN NEEDLES 31G X 6 MM MISC      Home: Home Living Family/patient expects to be discharged to:: Private residence Living Arrangements: Non-relatives/Friends Available Help at Discharge: Family, Friend(s), Available 24 hours/day Type of Home: House Home Access: Level entry Home Layout: One level Bathroom Shower/Tub: Tub/shower unit (takes sponge baths at sink) Bathroom Toilet: Handicapped height Bathroom Accessibility: Yes Home Equipment: Conservation officer, nature (2 wheels), Wheelchair - manual, Tub bench Additional Comments: lives with friend; will also have assist from brother and  sister at d/c   Functional History: Prior Function Prior Level of Function : Independent/Modified Independent Mobility Comments: Mod indep ambulating with LLE prosthetic and RW, typically uses manual w/c ADLs Comments: Does bird baths at sink from w/c   Functional Status:  Mobility: Bed Mobility Overal bed mobility: Needs Assistance Bed Mobility: Supine to Sit Rolling: Min guard Supine to sit: Min assist, HOB elevated General bed mobility comments: required assistance raising trunk, pt reliant on bed rail and HOB very elevated while performing Transfers Overall transfer level: Needs assistance Equipment used: Rolling walker (2 wheels), Ambulation equipment used Transfers: Sit to/from Stand, Bed to chair/wheelchair/BSC Sit to Stand: Mod assist, +2 safety/equipment, +2 physical assistance, From elevated surface Bed  to/from chair/wheelchair/BSC transfer type:: Via Lift equipment Stand pivot transfers: Max assist, +2 physical assistance, +2 safety/equipment, From elevated surface  Lateral/Scoot Transfers: Mod assist Transfer via Lift Equipment: Stedy General transfer comment: stood from EOB to [place] with +2 modA from elevated bed height, mod cues for safe UE placement and use of momentum required; totalA to transfer to chair from bed with [facility] for safety.   ADL: ADL Overall ADL's : Needs assistance/impaired Eating/Feeding: Modified independent, Bed level Grooming: Wash/dry hands, Wash/dry face, Supervision/safety, Sitting Grooming Details (indicate cue type and reason): in recliner Upper Body Bathing: Supervision/ safety, Sitting Upper Body Bathing Details (indicate cue type and reason): in recliner Lower Body Bathing: Moderate assistance, Sit to/from stand Lower Body Bathing Details (indicate cue type and reason): able to bathe legs seated and required max assist for peri area cleaning while standing in Stedy Upper Body Dressing : Minimal assistance, Sitting Upper Body Dressing  Details (indicate cue type and reason): in recliner Lower Body Dressing: Moderate assistance, Sitting/lateral leans Lower Body Dressing Details (indicate cue type and reason): able to don left prosthesis and required assistance with orthotic on RLE Toilet Transfer:  (Pt declined as he is currently using bed pan. Discussed importance of functional transfers to assist in increasing activity tolerance/endurance. Pt verbalized understanding/reports frustration w/ difficulty having BM of late & wanting to stay on bedpan.) Toilet Transfer Details (indicate cue type and reason): Pt was Mod A +2 SPT from bed that was raised significantly secondary to WB through R LE during transfers/heel only orders per podiatry/chart review. Pt required VC/TC Toileting- Clothing Manipulation and Hygiene: Maximal assistance, +2 for physical assistance, +2 for safety/equipment, Cueing for safety, Cueing for sequencing, Sit to/from stand Functional mobility during ADLs: Moderate assistance, Maximal assistance, +2 for physical assistance, +2 for safety/equipment, Cueing for safety, Cueing for sequencing, Rolling walker (2 wheels) (From elevated surface) General ADL Comments: self care performed seated in recliner with patient standing in stedy for peri area cleaning   Cognition: Cognition Overall Cognitive Status: No family/caregiver present to determine baseline cognitive functioning Orientation Level: Oriented X4 Cognition Arousal/Alertness: Awake/alert Behavior During Therapy: WFL for tasks assessed/performed Overall Cognitive Status: No family/caregiver present to determine baseline cognitive functioning Area of Impairment: Attention, Safety/judgement, Awareness, Problem solving, Following commands Current Attention Level: Selective Following Commands: Follows multi-step commands with increased time Safety/Judgement: Decreased awareness of safety, Decreased awareness of deficits Awareness: Emergent Problem Solving:  Slow processing, Requires verbal cues General Comments: difficulty maintaining WB precautions despite instructions, pt in wet bed upon therapist arrival to room (purewick on but had leaked?) but also sitting on bed pan with nothing in it.    Physical Exam: Blood pressure 122/80, pulse 61, temperature (!) 97.5 F (36.4 C), temperature source Oral, resp. rate 16, height $RemoveBe'6\' 4"'VpxdicZXU$  (1.93 m), weight 98.4 kg, SpO2 99 %.  Vitals and nursing note reviewed.  Constitutional:      Appearance: Normal appearance.  Cardiac: RRR Pulm: breathing comfortably on room air Abdominal:     General: There is no distension.     Tenderness: There is no abdominal tenderness.  Skin:    General: Skin is warm and dry.     Comments: Dry scabbed ulcer on left tibial tuberosity. Right foot with dry dressing. Old Left 2nd digit amputation site well healed.  Moving extremities well Neurological:     Mental Status: He is alert and oriented to person, place, and time.   Results for orders placed or performed during the hospital encounter of  02/15/22 (from the past 48 hour(s))  Glucose, capillary     Status: Abnormal   Collection Time: 03/06/22  9:29 PM  Result Value Ref Range   Glucose-Capillary 203 (H) 70 - 99 mg/dL    Comment: Glucose reference range applies only to samples taken after fasting for at least 8 hours.   Comment 1 Notify RN    Comment 2 Document in Chart   Glucose, capillary     Status: Abnormal   Collection Time: 03/07/22  6:14 AM  Result Value Ref Range   Glucose-Capillary 146 (H) 70 - 99 mg/dL    Comment: Glucose reference range applies only to samples taken after fasting for at least 8 hours.   Comment 1 Notify RN    Comment 2 Document in Chart   Glucose, capillary     Status: Abnormal   Collection Time: 03/07/22 11:51 AM  Result Value Ref Range   Glucose-Capillary 176 (H) 70 - 99 mg/dL    Comment: Glucose reference range applies only to samples taken after fasting for at least 8 hours.    Comment 1 Notify RN    Comment 2 Document in Chart   Glucose, capillary     Status: Abnormal   Collection Time: 03/07/22  4:34 PM  Result Value Ref Range   Glucose-Capillary 180 (H) 70 - 99 mg/dL    Comment: Glucose reference range applies only to samples taken after fasting for at least 8 hours.   Comment 1 Notify RN    Comment 2 Document in Chart   Glucose, capillary     Status: Abnormal   Collection Time: 03/07/22  9:07 PM  Result Value Ref Range   Glucose-Capillary 144 (H) 70 - 99 mg/dL    Comment: Glucose reference range applies only to samples taken after fasting for at least 8 hours.   Comment 1 Notify RN    Comment 2 Document in Chart   Glucose, capillary     Status: Abnormal   Collection Time: 03/08/22  6:20 AM  Result Value Ref Range   Glucose-Capillary 131 (H) 70 - 99 mg/dL    Comment: Glucose reference range applies only to samples taken after fasting for at least 8 hours.   Comment 1 Notify RN    Comment 2 Document in Chart   Glucose, capillary     Status: Abnormal   Collection Time: 03/08/22 12:34 PM  Result Value Ref Range   Glucose-Capillary 255 (H) 70 - 99 mg/dL    Comment: Glucose reference range applies only to samples taken after fasting for at least 8 hours.   Comment 1 Notify RN    Comment 2 Document in Chart    No results found.    Blood pressure 122/80, pulse 61, temperature (!) 97.5 F (36.4 C), temperature source Oral, resp. rate 16, height $RemoveBe'6\' 4"'uNjAWYSBr$  (1.93 m), weight 98.4 kg, SpO2 99 %.  Medical Problem List and Plan: 1. Functional deficits secondary to right 5th digit amputation  -patient may shower  -ELOS/Goals: 10-14 days S  Admit to CIR 2.  H/o DVT/Antithrombotics: -DVT/anticoagulation:  Pharmaceutical: Other (comment)--On eliquis  -antiplatelet therapy:  ASA/Plavix 3. Pain Management:  Oxycodone prn.   --Mg 1.9 on 05/31-->will add mag gluconate for supplement 4. Mood: LCSW to follow for evaluation and support.   -antipsychotic agents:  N/A 5. Neuropsych: This patient is capable of making decisions on his own behalf. 6. Skin/Wound Care:  Add protein supplement and vitamins to promote wound healing.  -- 7.  Fluids/Electrolytes/Nutrition: Monitor I/O. Check CMET in am  --Magnesium added to help with sleep, pain and BP 8. Osteomyelitis Right 5th toe s/p partial amputation: Augmentin with end date of 03/27/22  --follow up with ID after discharge.    --continue to monitor WBC--trending down.  9. CAD: On ASA, Plavix, Cozaar and Lipitor. M--monitor for symptoms with increase in activity.  10. T2DM: Hgb A1C- 11.8 (was 12.2 09/2021)--followed by PCP/nutritionist in [place]   --on Lantus 30 u BID, Novolog 10 units tid for meal coverage with SSI for elevated BS.   --reports compliance with insulin regimen/diet PTA.  11. 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  12.  A fib: Monitor HR/BP TID. On Eliquis.  13. Acute blood loss anemia: Recheck CBC in am.  14. Screening for vitamin D deficiency: check vitamin D level tomorrow.  15. Suboptimal magnesium level: start supplement today 16. Screening for B12 deficiency: B12 reviewed and is normal at 746 on 02/16/22.    I have personally performed a face to face diagnostic evaluation, including, but not limited to relevant history and physical exam findings, of this patient and developed relevant assessment and plan.  Additionally, I have reviewed and concur with the physician assistant's documentation above.  Leeroy Cha, MD  Bary Leriche, PA-C 03/08/2022

## 2022-03-08 NOTE — Progress Notes (Signed)
INPATIENT REHABILITATION ADMISSION NOTE   Arrival Method: bed     Mental Orientation: oriented x4   Assessment: done   Skin: done   IV'S: one present    Pain: none   Tubes and Drains: none   Safety Measures: reviewed with pt   Vital Signs: done   Height and Weight: done   Rehab Orientation: done   Family:     Notes: done   Marylu Lund, RN

## 2022-03-08 NOTE — Discharge Summary (Signed)
Physician Discharge Summary  Austin [name] VOZ:[id number] DOB: July 18, 1956 DOA: 02/15/2022  PCP: Theotis Burrow, MD  Admit date: 02/15/2022 Discharge date: 03/08/2022  Admitted From: Home Disposition:  CIR   Recommendations for Outpatient Follow-up:  Continue with Augmentin on discharge until 03/27/2022 Recommended for aspirin and Plavix for 1 month then aspirin Follow-up with vascular surgery in 2 to 3 weeks for incision checks Vascular Recommended for aspirin and Plavix ( stop date 6/25) for 1 month then aspirin   Discharge Condition:Stable CODE STATUS:FULL Diet recommendation: Heart Healthy   Brief/Interim Summary:  Austin [name] is a 66 yo male with PMH CAD, DM II, HLD, HTN, PVD s/p left BKA who presented with weakness and altered mentation.  Work-up revealed diabetic foot ulcer involving his right foot.  He underwent evaluations with podiatry, ID, and vascular surgery. He underwent right iliac artery stent on 02/20/2022 followed by right common femoral artery to tibial peroneal trunk bypass with ipsilateral nonreversed great saphenous vein on 5/22. This was then followed by partial fifth ray amputation right foot on 02/27/2022. He has since been evaluated for going to CIR at discharge.  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 Culture then updated on 5/28 to include rare Staph thus vanc added per ID; now has speciated to MSSA so vanc d'c once again - continue monotherapy unasyn with plans to transition to Augmentin at discharge to complete course on 03/27/22 per ID   Peripheral vascular disease ([place]) - s/p right iliac artery stent on 02/20/2022 followed by right common femoral artery to tibial peroneal trunk bypass with ipsilateral nonreversed great saphenous vein on 5/22. -Recommended for aspirin and Plavix for 1 month then aspirin -Follow-up with vascular surgery in 2 to 3 weeks for incision checks   Acute metabolic encephalopathy - resolved as of 03/02/2022 - MRI brain with remote infarct.  Seen by neurology (5/14: MRI brain negative for acute stroke. No further inpatient neurologic workup indicated. 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 - Patient has prosthesis   Type 2 diabetes mellitus with diabetic peripheral angiopathy without gangrene ([place]) - A1c 11.3% on 02/17/2022 - Continue Semglee and NovoLog sliding scale   Essential hypertension - Continue cozaar  Discharge Instructions  Discharge Instructions     Diet - low sodium heart healthy   Complete by: As directed    Discharge wound care:   Complete by: As directed  Per podiatry   Increase activity slowly   Complete by: As directed       Allergies as of 03/08/2022   No Known Allergies      Medication List     STOP taking these  medications    HumaLOG KwikPen 100 UNIT/ML KwikPen Generic drug: insulin lispro   Toujeo Max SoloStar 300 UNIT/ML Solostar Pen Generic drug: insulin glargine (2 Unit Dial)   Trulicity 6.20 BT/5.9RC Sopn Generic drug: Dulaglutide       TAKE these medications    Accu-Chek Guide test strip Generic drug: glucose blood 5 (five) times daily.   Accu-Chek Guide w/Device Kit 5 (five) times daily.   amoxicillin-clavulanate 875-125 MG tablet Commonly known as: AUGMENTIN Take 1 tablet by mouth 2 (two) times daily for 20 days.   apixaban 5 MG Tabs tablet Commonly known as: ELIQUIS Take 1 tablet (5 mg total) by mouth 2 (two) times daily.   aspirin EC 81 MG tablet Take 1 tablet (81 mg total) by mouth daily.   atorvastatin 80 MG tablet Commonly known as: LIPITOR Take 1 tablet (80 mg total) by mouth daily at 6 PM.   clopidogrel 75 MG tablet Commonly known as: PLAVIX Take 1 tablet (75 mg total) by mouth daily at 6 (six) AM for 23 days.   insulin aspart 100 UNIT/ML injection Commonly known as: novoLOG Inject 0-15 Units into the skin 3 (three) times daily with meals.   insulin aspart 100 UNIT/ML injection Commonly known as: novoLOG Inject 0-5 Units into the skin at bedtime.   insulin aspart 100 UNIT/ML injection Commonly known as: novoLOG Inject 10 Units into the skin 3 (three) times daily with meals.   insulin glargine-yfgn 100 UNIT/ML injection Commonly known as: SEMGLEE Inject 0.3 mLs (30 Units total) into the skin 2 (two) times daily.   losartan 25 MG tablet Commonly known as: COZAAR Take 25 mg by mouth daily.   oxyCODONE 5 MG immediate release tablet Commonly known as: Oxy IR/ROXICODONE Take 1 tablet (5 mg total) by mouth every 4 (four) hours as needed for moderate pain or severe pain.   pantoprazole 40 MG tablet Commonly known as: PROTONIX Take 1 tablet (40 mg total) by mouth daily.   traZODone 50 MG tablet Commonly known as: DESYREL Take 1 tablet (50 mg  total) by mouth at bedtime as needed for sleep.   TRUEplus 5-Bevel Pen Needles 31G X 6 MM Misc Generic drug: Insulin Pen Needle               Discharge Care Instructions  (From admission, onward)           Start     Ordered   03/07/22 0000  Discharge wound care:       Comments: Per podiatry   03/07/22 1410            Follow-up Information     Mignon Pine, DO Follow up on 03/21/2022.   Specialties: Infectious Diseases, Internal Medicine Why: Appointment at 230pm Contact information: [place] [address] [phone]         [facility] Follow up.   Why: 03/26/22. The office will call the patient with an appointment Contact information: [address] [place] [phone]               No Known Allergies  Consultations: Vascular surgery  podiatry ID   Procedures/Studies: MR BRAIN WO CONTRAST  Result Date: 02/16/2022  CLINICAL DATA:  Initial evaluation for acute delirium. EXAM: MRI HEAD WITHOUT CONTRAST TECHNIQUE: Multiplanar, multiecho pulse sequences of the brain and surrounding structures were obtained without intravenous contrast. COMPARISON:  Prior CTs from 02/15/2022. FINDINGS: Brain: Generalized age-related cerebral atrophy. Patchy and confluent T2/FLAIR hyperintensity involving the periventricular and deep white matter both cerebral hemispheres as well as the deep gray nuclei and pons, most consistent with chronic microvascular ischemic disease, moderately advanced in nature. Small remote left cerebellar infarct noted. No evidence for acute or subacute ischemia. Gray-white matter differentiation maintained. Or is of chronic cortical infarction. No acute or chronic intracranial blood products. No mass lesion, mass effect or midline shift. No hydrocephalus or extra-axial fluid collection. Pituitary gland suprasellar region normal. Vascular: Major intracranial vascular  flow voids are maintained. Skull and upper cervical spine: Craniocervical junction normal. Bone marrow signal intensity normal. No scalp soft tissue abnormality. Sinuses/Orbits: Prior bilateral ocular lens replacement. Scattered mucosal thickening noted throughout the paranasal sinuses. Trace bilateral mastoid effusions, of doubtful significance. Other: None. IMPRESSION: 1. No acute intracranial abnormality. 2. Age-related cerebral atrophy with moderately advanced chronic microvascular ischemic disease. 3. Small remote left cerebellar infarct. Electronically Signed   By: Jeannine Boga M.D.   On: 02/16/2022 22:29   DG Pelvis Portable  Result Date: 02/16/2022 CLINICAL DATA:  Pain. EXAM: PORTABLE PELVIS 1-2 VIEWS COMPARISON:  None Available. FINDINGS: There is no evidence of an acute pelvic fracture or diastasis. No pelvic bone lesions are seen. Moderate severity degenerative changes seen involving both hips in the form of joint space narrowing and acetabular sclerosis. Multiple radiopaque vascular stents are present within the pelvis and bilateral lower extremities. Mildly diluted radiopaque contrast is seen within the urinary bladder. IMPRESSION: 1. No acute osseous abnormality. 2. Moderate severity degenerative changes of both hips. Electronically Signed   By: Virgina Norfolk M.D.   On: 02/16/2022 01:31   MR FOOT RIGHT WO CONTRAST  Result Date: 02/16/2022 CLINICAL DATA:  Suspected osteomyelitis EXAM: MRI OF THE RIGHT FOREFOOT WITHOUT CONTRAST TECHNIQUE: Multiplanar, multisequence MR imaging of the right foot was performed. No intravenous contrast was administered. COMPARISON:  Right foot x-ray 02/15/2022 FINDINGS: Bones/Joint/Cartilage Abnormal bone marrow edema at the distal fifth metatarsal with bone destruction of the metatarsal head. No other suspicious bone marrow signal abnormalities identified in the forefoot. Mild degenerative changes at the first metatarsophalangeal joint and  interphalangeal joint noted. Ligaments No acute abnormality. Muscles and Tendons Edema throughout the plantar and dorsal musculature of the foot. No definite tenosynovitis visualized. Soft tissues Soft tissue wound at the lateral aspect of the foot overlying the fifth metatarsal head region. Diffuse subcutaneous soft tissue edema throughout the dorsum of the foot. IMPRESSION: 1. Evidence of osteomyelitis of the distal fifth metatarsal as described. 2. Overlying soft tissue wound at the lateral foot near the fifth metatarsal head. 3. Diffuse muscular edema throughout the foot. Marked subcutaneous soft tissue edema throughout the dorsum of the foot. Electronically Signed   By: Ofilia Neas M.D.   On: 02/16/2022 21:34   PERIPHERAL VASCULAR CATHETERIZATION  Result Date: 02/20/2022 Patient name: TALLIS SOLEDAD       MRN: [id number]        DOB: 11-24-1955          Sex: male  02/20/2022 Pre-operative Diagnosis: Critical limb ischemia of the right lower extremity with tissue loss and foot wound Post-operative diagnosis:  Same Surgeon:  Marty Heck, MD Procedure Performed: 1.  Ultrasound-guided access left common femoral artery 2.  Aortogram  with catheter selection of aorta 3.  Right lower extremity arteriogram with selection of second-order branches 4.  Right external iliac artery angioplasty with stent placement (7 mm x 40 mm self-expanding Innova postdilated with a 6 mm Mustang) 5.  61 minutes of monitored moderate conscious sedation time  Indications: 66 year old male seen in consultation over the weekend with right foot wound.  He has undergone multiple previous lower extremity interventions at [facility].  Ultimately he was seen with a dampened monophasic waveform in the right foot and severely diminished toe pressure of 36 inadequate for wound healing.  He presents today for aortogram, lower extremity arteriogram, possible intervention after risks and benefits discussed.  Findings:  Aortogram showed  both renal arteries are patent as well as the infrarenal aorta.  On the right which is the side of interest, his common iliac artery stent and external iliac stent are both widely patent.  There was about a 60% stenosis in the right external iliac artery distal to his existing stents.  The common femoral is patent with profunda runoff.  There is a flush SFA occlusion on the right and his previous SFA stents in the proximal SFA are occluded.  He does reconstitute a tibioperoneal trunk with runoff in the peroneal and posterior tibial also retrograde filling of the anterior tibial off the TP trunk with runoff into the foot.  Posterior tibial occludes above the ankle.  Ultimately the right external iliac stenosis was stented with a 7 mm self-expanding Innova with no residual stenosis.             Procedure:  The patient was identified in the holding area and taken to room 8.  The patient was then placed supine on the table and prepped and draped in the usual sterile fashion.  A time out was called.  Patient did receive moderate sedation with Versed and fentanyl.  All of his vital signs were monitored including heart rate, respiratory rate, blood pressure and oxygenation.  I was present for the entire sedation.  Ultrasound was used to evaluate the left common femoral artery.  It was patent .  A digital ultrasound image was acquired.  A micropuncture needle was used to access the left common femoral artery under ultrasound guidance.  An 018 wire was advanced without resistance and a micropuncture sheath was placed.  The 018 wire was removed and a benson wire was placed.  The micropuncture sheath was exchanged for a 5 french sheath.  An omniflush catheter was advanced over the wire to the level of L-1.  An abdominal angiogram was obtained.  Next, using the omniflush catheter and a benson wire, the aortic bifurcation was crossed and the catheter was placed into the right external iliac artery and right runoff was obtained.   Pertinent findings are noted above.  After evaluating the images, it looked like he needs a right lower extremity tibial bypass.  He has a very diminished femoral pulse on the right.  I elected to stent the 60% right external iliac stenosis for better inflow to support his bypass.  I upsized to a long 6 [place] Ansell sheath over the aortic bifurcation in the left groin with a Rosen wire.  Patient was given 100 units/kg IV heparin.  I then got dedicated imaging with oblique orientation of the C arm to mark the right external iliac stenosis.  The right external iliac stenosis was stented with a 7 mm x 40 mm self-expanding Innova postdilated with a 6 mm Mustang.  Excellent results with no residual stenosis.  Sheath was pulled back over the aortic bifurcation and he was taken to holding for sheath pull.   Plan: Right external iliac artery 60% stenosis was stented with a 7 mm x 40 mm self-expanding Innova with excellent results.  Patient will need vein mapping and right leg tibial bypass given flush right SFA occlusion.              Marty Heck, MD [facility] [place] Office: [phone]   DG Chest Port 1 View  Result Date: 02/15/2022 CLINICAL DATA:  Sepsis and right foot wound. EXAM: PORTABLE CHEST 1 VIEW COMPARISON:  October 22, 2018 FINDINGS: Multiple overlying radiopaque cardiac lead wires are seen. The heart size and mediastinal contours are within normal limits. Both lungs are clear. The visualized skeletal structures are unremarkable. IMPRESSION: No active disease. Electronically Signed   By: Virgina Norfolk M.D.   On: 02/15/2022 22:18   DG Foot Complete Right  Result Date: 02/27/2022 CLINICAL DATA:  Osteomyelitis left fifth metatarsal EXAM: RIGHT FOOT COMPLETE - 3+ VIEW COMPARISON:  02/15/2022 FINDINGS: There is interval surgical removal of right fifth toe. There is interval removal of right fifth metatarsal from the level of proximal shaft of fifth metatarsal. Small  plantar spur is seen in calcaneus. Bony spurs are noted in the dorsal aspect of talonavicular joint. There is soft tissue swelling over the dorsum. IMPRESSION: Status post surgical removal of right fifth toe and partial resection of right fifth metatarsal. Electronically Signed   By: Elmer Picker M.D.   On: 02/27/2022 19:49   DG Foot Complete Right  Result Date: 02/15/2022 CLINICAL DATA:  Sepsis.  Right foot wound. EXAM: RIGHT FOOT COMPLETE - 3+ VIEW COMPARISON:  None Available. FINDINGS: There is no evidence of fracture or dislocation. The second through fifth right toes are partially flexed in position and subsequently limited in evaluation. Mild degenerative changes are seen along the dorsal aspect of the proximal to mid right foot. Mild dorsal soft tissue swelling is also seen. IMPRESSION: 1. Mild degenerative changes along the dorsal aspect of the proximal to mid right foot. 2. Mild dorsal soft tissue swelling without an acute osseous abnormality. Electronically Signed   By: Virgina Norfolk M.D.   On: 02/15/2022 22:21   DG MINI C-ARM IMAGE ONLY  Result Date: 02/27/2022 There is no interpretation for this exam.  This order is for images obtained during a surgical procedure.  Please See "Surgeries" Tab for more information regarding the procedure.   VAS [place] LOWER EXTREMITY SAPHENOUS VEIN MAPPING  Result Date: 02/22/2022 LOWER EXTREMITY VEIN MAPPING Patient Name:  SULEMAN GUNNING  Date of Exam:   02/21/2022 Medical Rec #: [id number]         Accession #:    [id number] Date of Birth: Jun 26, 1956         Patient Gender: M Patient Age:   88 years Exam Location:  [facility] Procedure:      VAS [place] LOWER EXTREMITY SAPHENOUS VEIN MAPPING Referring Phys: Monica Martinez --------------------------------------------------------------------------------  Indications:        Pre-op History:            History of PAD; patient is pre-operative for lower extremity                     bypass graft. Risk  Factors:       PAD. Other Risk Factors: Left BKA.  Comparison Study: No prior  study Performing Technologist: Sharion Dove RVS  Examination Guidelines: A complete evaluation includes B-mode imaging, spectral Doppler, color Doppler, and power Doppler as needed of all accessible portions of each vessel. Bilateral testing is considered an integral part of a complete examination. Limited examinations for reoccurring indications may be performed as noted. +----------------+----------+----------------------+---------------+-----------+ RT Diameter (cm)    RT             GSV            LT Diameter  LT Findings                  Findings                            (cm)                  +----------------+----------+----------------------+---------------+-----------+       0.57                    Saphenofemoral         0.30                                                   Junction                                  +----------------+----------+----------------------+---------------+-----------+       0.58                    Proximal thigh         0.16                  +----------------+----------+----------------------+---------------+-----------+       0.50                      Mid thigh            0.21                  +----------------+----------+----------------------+---------------+-----------+    0.43/0.17    branching      Distal thigh          0.29                  +----------------+----------+----------------------+---------------+-----------+       0.51                         Knee              0.24                  +----------------+----------+----------------------+---------------+-----------+  3.70/0.21/0.19 branching       Prox calf                          BKA     +----------------+----------+----------------------+---------------+-----------+    0.42/0.173   branching        Mid calf                          BKA      +----------------+----------+----------------------+---------------+-----------+    0.29/0.20    branching      Distal  calf                         BKA     +----------------+----------+----------------------+---------------+-----------+    0.19/0.174   branching         Ankle                            BKA     +----------------+----------+----------------------+---------------+-----------+ Diagnosing physician: Orlie Pollen Electronically signed by Orlie Pollen on 02/22/2022 at 5:13:13 PM.    Final    CT HEAD CODE STROKE WO CONTRAST  Result Date: 02/15/2022 CLINICAL DATA:  Code stroke. Neuro deficit, acute, stroke suspected. Left-sided deficits and slurred speech. EXAM: CT HEAD WITHOUT CONTRAST TECHNIQUE: Contiguous axial images were obtained from the base of the skull through the vertex without intravenous contrast. RADIATION DOSE REDUCTION: This exam was performed according to the departmental dose-optimization program which includes automated exposure control, adjustment of the mA and/or kV according to patient size and/or use of iterative reconstruction technique. COMPARISON:  None Available. FINDINGS: Brain: Within limitations of moderate motion artifact, no acute cortically based infarct, intracranial hemorrhage, mass, midline shift, or extra-axial fluid collection is identified. The ventricles and sulci are within normal limits for age. Patchy hypodensities in the cerebral white matter bilaterally are nonspecific but compatible with mild chronic small vessel ischemic disease. Vascular: Calcified atherosclerosis at the skull base. No hyperdense vessel. Skull: No fracture or suspicious osseous lesion. Sinuses/Orbits: Small mucous retention cyst in the right maxillary sinus. Clear mastoid air cells. Bilateral cataract extraction. Other: None. ASPECTS [facility] Stroke Program Early CT Score) - Ganglionic level infarction (caudate, lentiform nuclei, internal capsule, insula, M1-M3 cortex): 7 -  Supraganglionic infarction (M4-M6 cortex): 3 Total score (0-10 with 10 being normal): 10 IMPRESSION: 1. No acute infarct or intracranial hemorrhage identified on this motion degraded study. ASPECTS of 10. 2. Mild chronic small vessel ischemic disease. These results were communicated to Dr. Lorrin Goodell at 9:06 pm on 02/15/2022 by text page via the [facility] messaging system. Electronically Signed   By: Logan Bores M.D.   On: 02/15/2022 21:07   CT ANGIO HEAD NECK W WO CM (CODE STROKE)  Result Date: 02/15/2022 CLINICAL DATA:  Neuro deficit, acute, stroke suspected. Left-sided deficits and slurred speech. EXAM: CT ANGIOGRAPHY HEAD AND NECK TECHNIQUE: Multidetector CT imaging of the head and neck was performed using the standard protocol during bolus administration of intravenous contrast. Multiplanar CT image reconstructions and MIPs were obtained to evaluate the vascular anatomy. Carotid stenosis measurements (when applicable) are obtained utilizing NASCET criteria, using the distal internal carotid diameter as the denominator. RADIATION DOSE REDUCTION: This exam was performed according to the departmental dose-optimization program which includes automated exposure control, adjustment of the mA and/or kV according to patient size and/or use of iterative reconstruction technique. CONTRAST:  57m OMNIPAQUE IOHEXOL 350 MG/ML SOLN COMPARISON:  None Available. FINDINGS: CTA NECK FINDINGS Aortic arch: Standard 3 vessel aortic arch with widely patent arch vessel origins. Right carotid system: Patent with minimal calcified and soft plaque at the carotid bifurcation. No evidence of a significant stenosis or dissection. Left carotid system: Patent with a small amount calcified plaque at the carotid bifurcation. No evidence of a significant stenosis or dissection. Vertebral arteries: Patent and codominant without evidence of a significant stenosis or dissection. Skeleton: Extensive prior posterior decompression in the cervical  spine. Disc degeneration most advanced at C3-4. Other neck: No evidence of cervical lymphadenopathy or  mass. Upper chest: Clear lung apices. Review of the MIP images confirms the above findings CTA HEAD FINDINGS Anterior circulation: The internal carotid arteries are patent from skull base to carotid termini with calcified plaque resulting in mild cavernous segment stenosis bilaterally. ACAs and MCAs are patent without evidence of a proximal branch occlusion. There is a mild proximal left M1 stenosis. No aneurysm is identified. Posterior circulation: The intracranial vertebral arteries are widely patent to the basilar. Patent bilateral PICA, right AICA, and bilateral SCA origins are visualized. The basilar artery is patent with mild irregularity but no significant stenosis. Both PCAs are patent without evidence of a significant proximal stenosis. No aneurysm is identified. Venous sinuses: As permitted by contrast timing, patent. Anatomic variants: None. Review of the MIP images confirms the above findings IMPRESSION: Mild atherosclerosis in the head and neck without a large vessel occlusion or flow limiting proximal stenosis. These results were communicated to Dr. Lorrin Goodell at 9:06 pm on 02/15/2022 by text page via the [facility] messaging system. Electronically Signed   By: Logan Bores M.D.   On: 02/15/2022 21:20      Subjective:  No significant events overnight, patient denies any complaints today.  Discharge Exam: Vitals:   03/08/22 0401 03/08/22 0819  BP: 140/75 117/88  Pulse: 71 90  Resp: 18 19  Temp: (!) 97.2 F (36.2 C) (!) 97.2 F (36.2 C)  SpO2: 99% 98%   Vitals:   03/07/22 1958 03/08/22 0025 03/08/22 0401 03/08/22 0819  BP: 120/77 137/76 140/75 117/88  Pulse: 67 76 71 90  Resp: '13 20 18 19  ' Temp: (!) 97.3 F (36.3 C) 97.6 F (36.4 C) (!) 97.2 F (36.2 C) (!) 97.2 F (36.2 C)  TempSrc: Oral Oral Oral Oral  SpO2: 100% 98% 99% 98%  Weight:   98.2 kg   Height:        General: Pt  is alert, awake, not in acute distress Cardiovascular: RRR, S1/S2 +, no rubs, no gallops Respiratory: CTA bilaterally, no wheezing, no rhonchi Abdominal: Soft, NT, ND, bowel sounds + Extremities: Right foot bandaged, left BKA.    The results of significant diagnostics from this hospitalization (including imaging, microbiology, ancillary and laboratory) are listed below for reference.     Microbiology: Recent Results (from the past 240 hour(s))  Culture, blood (Routine X 2) w Reflex to ID Panel     Status: None   Collection Time: 02/26/22 12:22 PM   Specimen: BLOOD RIGHT HAND  Result Value Ref Range Status   Specimen Description BLOOD RIGHT HAND  Final   Special Requests   Final    AEROBIC BOTTLE ONLY Blood Culture results may not be optimal due to an inadequate volume of blood received in culture bottles   Culture   Final    NO GROWTH 5 DAYS Performed at [facility], [place] [address] [place], [place] [address]    Report Status 03/03/2022 FINAL  Final  Culture, blood (Routine X 2) w Reflex to ID Panel     Status: None   Collection Time: 02/26/22 12:25 PM   Specimen: BLOOD RIGHT HAND  Result Value Ref Range Status   Specimen Description BLOOD RIGHT HAND  Final   Special Requests   Final    AEROBIC BOTTLE ONLY Blood Culture results may not be optimal due to an inadequate volume of blood received in culture bottles   Culture   Final    NO GROWTH 5 DAYS Performed at [facility],  [address] [place], [place] [address]    Report Status 03/03/2022 FINAL  Final  Aerobic/Anaerobic Culture w Gram Stain (surgical/deep wound)     Status: None   Collection Time: 02/27/22  6:19 PM   Specimen: PATH Digit amputation; Tissue  Result Value Ref Range Status   Specimen Description BONE  Final   Special Requests RIGHT FIFTH TOE  Final   Gram Stain NO WBC SEEN NO ORGANISMS SEEN   Final   Culture   Final    RARE GROUP B STREP(S.AGALACTIAE)ISOLATED TESTING AGAINST S.  AGALACTIAE NOT ROUTINELY PERFORMED DUE TO PREDICTABILITY OF AMP/PEN/VAN SUSCEPTIBILITY. RARE STAPHYLOCOCCUS AUREUS NO ANAEROBES ISOLATED Performed at [facility], [place] [address] [place], [place] [address]    Report Status 03/05/2022 FINAL  Final   Organism ID, Bacteria STAPHYLOCOCCUS AUREUS  Final      Susceptibility   Staphylococcus aureus - MIC*    CIPROFLOXACIN <=0.5 SENSITIVE Sensitive     ERYTHROMYCIN <=0.25 SENSITIVE Sensitive     GENTAMICIN <=0.5 SENSITIVE Sensitive     OXACILLIN <=0.25 SENSITIVE Sensitive     TETRACYCLINE <=1 SENSITIVE Sensitive     VANCOMYCIN 1 SENSITIVE Sensitive     TRIMETH/SULFA <=10 SENSITIVE Sensitive     CLINDAMYCIN <=0.25 SENSITIVE Sensitive     RIFAMPIN <=0.5 SENSITIVE Sensitive     Inducible Clindamycin NEGATIVE Sensitive     * RARE STAPHYLOCOCCUS AUREUS     Labs: BNP (last 3 results) Recent Labs    03/30/21 0958  BNP 00.7   Basic Metabolic Panel: Recent Labs  Lab 03/02/22 0157 03/05/22 0207 03/06/22 0116  NA 140 137 138  K 4.0 3.9 4.0  CL 109 108 109  CO2 '22 23 25  ' GLUCOSE 258* 189* 212*  BUN '14 10 10  ' CREATININE 1.01 0.85 0.82  CALCIUM 8.5* 8.5* 8.7*  MG  --  1.9 1.9   Liver Function Tests: No results for input(s): AST, ALT, ALKPHOS, BILITOT, PROT, ALBUMIN in the last 168 hours. No results for input(s): LIPASE, AMYLASE in the last 168 hours. No results for input(s): AMMONIA in the last 168 hours. CBC: Recent Labs  Lab 03/02/22 0157 03/05/22 0207 03/06/22 0116  WBC 9.2 12.1* 9.3  NEUTROABS  --  8.2* 5.6  HGB 10.3* 11.3* 11.2*  HCT 30.9* 34.4* 33.9*  MCV 83.5 83.9 83.9  PLT 226 319 308   Cardiac Enzymes: No results for input(s): CKTOTAL, CKMB, CKMBINDEX, TROPONINI in the last 168 hours. BNP: Invalid input(s): POCBNP CBG: Recent Labs  Lab 03/07/22 0614 03/07/22 1151 03/07/22 1634 03/07/22 2107 03/08/22 0620  GLUCAP 146* 176* 180* 144* 131*   D-Dimer No results for input(s): DDIMER in the last 72  hours. Hgb A1c No results for input(s): HGBA1C in the last 72 hours. Lipid Profile No results for input(s): CHOL, HDL, LDLCALC, TRIG, CHOLHDL, LDLDIRECT in the last 72 hours. Thyroid function studies No results for input(s): TSH, T4TOTAL, T3FREE, THYROIDAB in the last 72 hours.  Invalid input(s): FREET3 Anemia work up No results for input(s): VITAMINB12, FOLATE, FERRITIN, TIBC, IRON, RETICCTPCT in the last 72 hours. Urinalysis    Component Value Date/Time   COLORURINE YELLOW (A) 03/30/2021 0958   APPEARANCEUR CLEAR (A) 03/30/2021 0958   APPEARANCEUR Cloudy (A) 11/20/2020 1611   LABSPEC 1.027 03/30/2021 0958   LABSPEC 1.024 11/12/2013 2001   PHURINE 5.0 03/30/2021 0958   GLUCOSEU >=500 (A) 03/30/2021 0958   GLUCOSEU >=500 11/12/2013 2001   HGBUR NEGATIVE 03/30/2021 [place]  NEGATIVE 03/30/2021 0958   BILIRUBINUR Negative 11/20/2020 1611   BILIRUBINUR Negative 11/12/2013 2001   KETONESUR NEGATIVE 03/30/2021 0958   PROTEINUR NEGATIVE 03/30/2021 0958   NITRITE NEGATIVE 03/30/2021 0958   LEUKOCYTESUR NEGATIVE 03/30/2021 0958   LEUKOCYTESUR Negative 11/12/2013 2001   Sepsis Labs Invalid input(s): PROCALCITONIN,  WBC,  LACTICIDVEN Microbiology Recent Results (from the past 240 hour(s))  Culture, blood (Routine X 2) w Reflex to ID Panel     Status: None   Collection Time: 02/26/22 12:22 PM   Specimen: BLOOD RIGHT HAND  Result Value Ref Range Status   Specimen Description BLOOD RIGHT HAND  Final   Special Requests   Final    AEROBIC BOTTLE ONLY Blood Culture results may not be optimal due to an inadequate volume of blood received in culture bottles   Culture   Final    NO GROWTH 5 DAYS Performed at [facility], [place] [address], [place], [place] [address]    Report Status 03/03/2022 FINAL  Final  Culture, blood (Routine X 2) w Reflex to ID Panel     Status: None   Collection Time: 02/26/22 12:25 PM   Specimen: BLOOD RIGHT HAND  Result Value Ref Range Status    Specimen Description BLOOD RIGHT HAND  Final   Special Requests   Final    AEROBIC BOTTLE ONLY Blood Culture results may not be optimal due to an inadequate volume of blood received in culture bottles   Culture   Final    NO GROWTH 5 DAYS Performed at [facility], [place] [address], [place], [place] [address]    Report Status 03/03/2022 FINAL  Final  Aerobic/Anaerobic Culture w Gram Stain (surgical/deep wound)     Status: None   Collection Time: 02/27/22  6:19 PM   Specimen: PATH Digit amputation; Tissue  Result Value Ref Range Status   Specimen Description BONE  Final   Special Requests RIGHT FIFTH TOE  Final   Gram Stain NO WBC SEEN NO ORGANISMS SEEN   Final   Culture   Final    RARE GROUP B STREP(S.AGALACTIAE)ISOLATED TESTING AGAINST S. AGALACTIAE NOT ROUTINELY PERFORMED DUE TO PREDICTABILITY OF AMP/PEN/VAN SUSCEPTIBILITY. RARE STAPHYLOCOCCUS AUREUS NO ANAEROBES ISOLATED Performed at [facility], [place] [address] [place], [place] [address]    Report Status 03/05/2022 FINAL  Final   Organism ID, Bacteria STAPHYLOCOCCUS AUREUS  Final      Susceptibility   Staphylococcus aureus - MIC*    CIPROFLOXACIN <=0.5 SENSITIVE Sensitive     ERYTHROMYCIN <=0.25 SENSITIVE Sensitive     GENTAMICIN <=0.5 SENSITIVE Sensitive     OXACILLIN <=0.25 SENSITIVE Sensitive     TETRACYCLINE <=1 SENSITIVE Sensitive     VANCOMYCIN 1 SENSITIVE Sensitive     TRIMETH/SULFA <=10 SENSITIVE Sensitive     CLINDAMYCIN <=0.25 SENSITIVE Sensitive     RIFAMPIN <=0.5 SENSITIVE Sensitive     Inducible Clindamycin NEGATIVE Sensitive     * RARE STAPHYLOCOCCUS AUREUS     Time coordinating discharge: Over 30 minutes  SIGNED:   Phillips Climes, MD  [facility] 03/08/2022, 11:32 AM Pager   If 7PM-7AM, please contact night-coverage [email or web address] Password [email or web address]

## 2022-03-08 NOTE — Progress Notes (Signed)
Inpatient Rehabilitation Admission Medication Review by a Pharmacist  A complete drug regimen review was completed for this patient to identify any potential clinically significant medication issues.  High Risk Drug Classes Is patient taking? Indication by Medication  Antipsychotic Yes PRN compazine for N/V  Anticoagulant Yes Eliquis for Afib  Antibiotic Yes Po Augmentin for diabetic foot - 4 week course  Opioid Yes Oxycodone prn pain  Antiplatelet Yes Clopidogrel for PVD  Hypoglycemics/insulin Yes SSI, Sem-glee for DM  Vasoactive Medication No Losartan for BP  Chemotherapy No   Other Yes Atorvastatin for HLD Pantoprazole for Gerd     Type of Medication Issue Identified Description of Issue Recommendation(s)  Drug Interaction(s) (clinically significant)     Duplicate Therapy     Allergy     No Medication Administration End Date     Incorrect Dose     Additional Drug Therapy Needed     Significant med changes from prior encounter (inform family/care partners about these prior to discharge).    Other       Clinically significant medication issues were identified that warrant physician communication and completion of prescribed/recommended actions by midnight of the next day:  No  Pharmacist comments: Augmentin x 4 weeks, DAPT x 1 month then ASA only  Time spent performing this drug regimen review (minutes):  20 minutes   Elwin Sleight 03/08/2022 11:10 AM

## 2022-03-08 NOTE — Progress Notes (Signed)
PMR Admission Coordinator Pre-Admission Assessment   Patient: Austin [name] is an 66 y.o., male MRN: [id number] DOB: March 18, 1956 Height: '6\' 4"'  (193 cm) Weight: 98.2 kg   Insurance Information HMO: yes    PPO:      PCP:      IPA:      80/20:      OTHER:  PRIMARY: U[employer] Medicare      Policy#: [id number]      Subscriber: pt CM Name: expedited appeals dept      Phone#: [phone]     Fax#: [phone] Pre-Cert#: [id number] auth for CIR via expedited appeal with [facility] Medicare with updates due to fax listed above on 6/7      Employer:  Benefits:  Phone #: [phone]     Name:  Eff. Date: 01/05/22     Deduct: $0      Out of Pocket Max: $8300 (met $17.36)      Life Max: n/a CIR: $1556/admit      SNF: 20 full days Outpatient: 80%     Co-Ins: 20% Home Health: 100%      Co-Pay:  DME: 80%     Co-Ins: 20% Providers:  SECONDARY: Medicaid      Policy#: [id number]     Phone#: [phone]   Financial Counselor:       Phone#:    The "Data Collection Information Summary" for patients in Inpatient Rehabilitation Facilities with attached "Privacy Act [place] Records" was provided and verbally reviewed with: Patient   Emergency Contact Information Contact Information       Name Relation Home Work Mobile    [place] Sister [phone]   [phone]    Swan, Zayed [phone]   [phone]    Donald Siva     [phone]           Current Medical History  Patient Admitting Diagnosis: debility    History of Present Illness: Pt is a 66 y/o with PMH of PVD s/p 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  MRI negative.  He underwent failed RLE arteriorgram and angioplasty of external iliac artery on 5/17 and tibial bypass on 5/22, as well as 5th ray amputation on 5/24.  Post op course ID consult with recs for rocephin, flagyl, and vancomycin.  Therapy ongoing and pt was  recommended for CIR.     Complete NIHSS TOTAL: 0   Patient's medical record from [facility] has been reviewed by the rehabilitation admission coordinator and physician.   Past Medical History      Past Medical History:  Diagnosis Date   Coronary artery disease     Diabetes ([place])     DVT (deep venous thrombosis) ([place])     Gangrene of left foot ([place]) 02/09/2018   GERD (gastroesophageal reflux disease)     Hyperlipidemia     Hypertension     Peripheral vascular disease ([place])        Has the patient had major surgery during 100 days prior to admission? Yes   Family History   family history includes Coronary artery disease in his father; Diabetes in his mother; Hypertension in his sister; Leukemia in his brother.   Current Medications   Current Facility-Administered Medications:    0.9 %  sodium chloride infusion, 500 mL, Intravenous, Once PRN, Rhyne, Samantha J, PA-C   acetaminophen (TYLENOL) tablet 325-650 mg, 325-650 mg, Oral, Q4H PRN, 650  mg at 02/26/22 0551 **OR** acetaminophen (TYLENOL) suppository 325-650 mg, 325-650 mg, Rectal, Q4H PRN, Rhyne, Samantha J, PA-C   alum & mag hydroxide-simeth (MAALOX/MYLANTA) 200-200-20 MG/5ML suspension 15-30 mL, 15-30 mL, Oral, Q2H PRN, Rhyne, Samantha J, PA-C   amoxicillin-clavulanate (AUGMENTIN) 875-125 MG per tablet 1 tablet, 1 tablet, Oral, Q12H, Samtani, Jai-Gurmukh, MD, 1 tablet at 03/08/22 0403   apixaban (ELIQUIS) tablet 5 mg, 5 mg, Oral, BID, Vann, Jessica U, DO, 5 mg at 03/08/22 5993   aspirin EC tablet 81 mg, 81 mg, Oral, Daily, Rhyne, Samantha J, PA-C, 81 mg at 03/08/22 0949   atorvastatin (LIPITOR) tablet 80 mg, 80 mg, Oral, q1800, Rhyne, Samantha J, PA-C, 80 mg at 03/07/22 1713   clopidogrel (PLAVIX) tablet 75 mg, 75 mg, Oral, Q0600, Rhyne, Samantha J, PA-C, 75 mg at 03/08/22 5701   guaiFENesin (ROBITUSSIN) 100 MG/5ML liquid 5 mL, 5 mL, Oral, Q4H PRN, Rhyne, Samantha J, PA-C   hydrALAZINE (APRESOLINE) injection 10 mg, 10 mg,  Intravenous, Q4H PRN, Rhyne, Samantha J, PA-C   insulin aspart (novoLOG) injection 0-15 Units, 0-15 Units, Subcutaneous, TID WC, Rhyne, Samantha J, PA-C, 2 Units at 03/08/22 7793   insulin aspart (novoLOG) injection 0-5 Units, 0-5 Units, Subcutaneous, QHS, Rhyne, Samantha J, PA-C, 2 Units at 03/06/22 2209   insulin aspart (novoLOG) injection 10 Units, 10 Units, Subcutaneous, TID WC, Dwyane Dee, MD, 10 Units at 03/07/22 1714   insulin glargine-yfgn (SEMGLEE) injection 30 Units, 30 Units, Subcutaneous, BID, Dwyane Dee, MD, 30 Units at 03/08/22 0954   ipratropium-albuterol (DUONEB) 0.5-2.5 (3) MG/3ML nebulizer solution 3 mL, 3 mL, Nebulization, Q4H PRN, Rhyne, Samantha J, PA-C   labetalol (NORMODYNE) injection 10 mg, 10 mg, Intravenous, Q10 min PRN, Rhyne, Samantha J, PA-C, 10 mg at 02/26/22 0424   losartan (COZAAR) tablet 25 mg, 25 mg, Oral, Daily, Rhyne, Samantha J, PA-C, 25 mg at 03/08/22 0949   metoprolol tartrate (LOPRESSOR) injection 2-5 mg, 2-5 mg, Intravenous, Q2H PRN, Rhyne, Samantha J, PA-C   morphine (PF) 2 MG/ML injection 2 mg, 2 mg, Intravenous, Q2H PRN, Rhyne, Samantha J, PA-C, 2 mg at 02/26/22 0420   ondansetron (ZOFRAN) tablet 4 mg, 4 mg, Oral, Q6H PRN **OR** ondansetron (ZOFRAN) injection 4 mg, 4 mg, Intravenous, Q6H PRN, Rhyne, Samantha J, PA-C   oxyCODONE (Oxy IR/ROXICODONE) immediate release tablet 5 mg, 5 mg, Oral, Q4H PRN, Rhyne, Samantha J, PA-C, 5 mg at 03/05/22 1650   pantoprazole (PROTONIX) EC tablet 40 mg, 40 mg, Oral, Daily, Rhyne, Samantha J, PA-C, 40 mg at 03/08/22 0949   phenol (CHLORASEPTIC) mouth spray 1 spray, 1 spray, Mouth/Throat, PRN, Rhyne, Samantha J, PA-C   polyethylene glycol (MIRALAX / GLYCOLAX) packet 17 g, 17 g, Oral, Daily, Rhyne, Samantha J, PA-C, 17 g at 03/08/22 0954   senna-docusate (Senokot-S) tablet 1 tablet, 1 tablet, Oral, QHS PRN, Rhyne, Samantha J, PA-C, 1 tablet at 02/25/22 2138   sodium chloride (OCEAN) 0.65 % nasal spray 1 spray, 1 spray,  Each Nare, PRN, Elgergawy, Silver Huguenin, MD, 1 spray at 02/26/22 2249   sodium chloride flush (NS) 0.9 % injection 3 mL, 3 mL, Intravenous, Once, Rhyne, Samantha J, PA-C   traZODone (DESYREL) tablet 50 mg, 50 mg, Oral, QHS PRN, Rhyne, Samantha J, PA-C, 50 mg at 02/23/22 2214   Patients Current Diet:  Diet Order                  Diet - low sodium heart healthy  Diet Carb Modified Fluid consistency: Thin; Room service appropriate? Yes with Assist  Diet effective now                         Precautions / Restrictions Precautions Precautions: Fall, Other (comment) Precaution Comments: H/o L BKA (prosthetic in room) Other Brace: RLE darco shoe (in room) Restrictions Weight Bearing Restrictions: Yes RLE Weight Bearing: Partial weight bearing RLE Partial Weight Bearing Percentage or Pounds:  (Heels only) Other Position/Activity Restrictions: WB through R heel for transfers only per podiatry    Has the patient had 2 or more falls or a fall with injury in the past year? Yes   Prior Activity Level Household: mod I prior to admit, prosthetic and RW, not driving   Prior Functional Level Self Care: Did the patient need help bathing, dressing, using the toilet or eating? Independent   Indoor Mobility: Did the patient need assistance with walking from room to room (with or without device)? Independent   Stairs: Did the patient need assistance with internal or external stairs (with or without device)? Independent   Functional Cognition: Did the patient need help planning regular tasks such as shopping or remembering to take medications? Independent   Patient Information Are you of Hispanic, Latino/a,or Spanish origin?: A. No, not of Hispanic, Latino/a, or Spanish origin What is your race?: B. Black or African American Do you need or want an interpreter to communicate with a doctor or health care staff?: 0. No   Patient's Response To:  Health Literacy and Transportation Is  the patient able to respond to health literacy and transportation needs?: Yes Health Literacy - How often do you need to have someone help you when you read instructions, pamphlets, or other written material from your doctor or pharmacy?: Always In the past 12 months, has lack of transportation kept you from medical appointments or from getting medications?: Yes In the past 12 months, has lack of transportation kept you from meetings, work, or from getting things needed for daily living?: Yes   [place] / [place] Devices/Equipment: Environmental consultant (specify type), Prosthesis Home Equipment: Rolling Walker (2 wheels), Wheelchair - manual, Tub bench   Prior Device Use: Indicate devices/aids used by the patient prior to current illness, exacerbation or injury? Walker and Orthotics/Prosthetics   Current Functional Level Cognition   Overall Cognitive Status: No family/caregiver present to determine baseline cognitive functioning Current Attention Level: Selective Orientation Level: Oriented X4 Following Commands: Follows multi-step commands with increased time Safety/Judgement: Decreased awareness of safety, Decreased awareness of deficits General Comments: difficulty maintaining WB precautions despite instructions, pt in wet bed upon therapist arrival to room (purewick on but had leaked?) but also sitting on bed pan with nothing in it.    Extremity Assessment (includes Sensation/Coordination)   Upper Extremity Assessment: Generalized weakness, Overall WFL for tasks assessed  Lower Extremity Assessment: Defer to PT evaluation RLE Deficits / Details: s/p RLE revascularization; hip and knee functionally at least 3/5 strength RLE Sensation: history of peripheral neuropathy LLE Deficits / Details: h/o L BKA; hip and knee functionally at least 3/5 strength LLE Sensation: history of peripheral neuropathy     ADLs   Overall ADL's : Needs assistance/impaired Eating/Feeding:  Modified independent, Bed level Grooming: Wash/dry hands, Wash/dry face, Supervision/safety, Sitting Grooming Details (indicate cue type and reason): in recliner Upper Body Bathing: Supervision/ safety, Sitting Upper Body Bathing Details (indicate cue type and reason): in recliner Lower Body Bathing: Moderate assistance,  Sit to/from stand Lower Body Bathing Details (indicate cue type and reason): able to bathe legs seated and required max assist for peri area cleaning while standing in Stedy Upper Body Dressing : Minimal assistance, Sitting Upper Body Dressing Details (indicate cue type and reason): in recliner Lower Body Dressing: Moderate assistance, Sitting/lateral leans Lower Body Dressing Details (indicate cue type and reason): able to don left prosthesis and required assistance with orthotic on RLE Toilet Transfer:  (Pt declined as he is currently using bed pan. Discussed importance of functional transfers to assist in increasing activity tolerance/endurance. Pt verbalized understanding/reports frustration w/ difficulty having BM of late & wanting to stay on bedpan.) Toilet Transfer Details (indicate cue type and reason): Pt was Mod A +2 SPT from bed that was raised significantly secondary to WB through R LE during transfers/heel only orders per podiatry/chart review. Pt required VC/TC Toileting- Clothing Manipulation and Hygiene: Maximal assistance, +2 for physical assistance, +2 for safety/equipment, Cueing for safety, Cueing for sequencing, Sit to/from stand Functional mobility during ADLs: Moderate assistance, Maximal assistance, +2 for physical assistance, +2 for safety/equipment, Cueing for safety, Cueing for sequencing, Rolling walker (2 wheels) (From elevated surface) General ADL Comments: self care performed seated in recliner with patient standing in stedy for peri area cleaning     Mobility   Overal bed mobility: Needs Assistance Bed Mobility: Supine to Sit Rolling: Min  guard Supine to sit: Min assist, HOB elevated General bed mobility comments: required assistance raising trunk, pt reliant on bed rail and HOB very elevated while performing     Transfers   Overall transfer level: Needs assistance Equipment used: Rolling walker (2 wheels), Ambulation equipment used Transfers: Sit to/from Stand, Bed to chair/wheelchair/BSC Sit to Stand: Mod assist, +2 safety/equipment, +2 physical assistance, From elevated surface Bed to/from chair/wheelchair/BSC transfer type:: Via Lift equipment Stand pivot transfers: Max assist, +2 physical assistance, +2 safety/equipment, From elevated surface  Lateral/Scoot Transfers: Mod assist Transfer via Lift Equipment: Stedy General transfer comment: stood from EOB to [place] with +2 modA from elevated bed height, mod cues for safe UE placement and use of momentum required; totalA to transfer to chair from bed with [facility] for safety.     Ambulation / Gait / Stairs / Proofreader / Balance Dynamic Sitting Balance Sitting balance - Comments: able to don L BKA prosthetic with assist for set-up; requires assist to don R darco shoe Balance Overall balance assessment: Needs assistance Sitting-balance support: Single extremity supported, No upper extremity supported Sitting balance-Leahy Scale: Fair Sitting balance - Comments: able to don L BKA prosthetic with assist for set-up; requires assist to don R darco shoe Standing balance support: Bilateral upper extremity supported, Reliant on assistive device for balance Standing balance-Leahy Scale: Poor Standing balance comment: Pt able to stand ~3 mins in Stedy frame for posterior/peri-care due to wet bed, fair compliance with RLE heel WB in [place];     Special needs/care consideration Continuous Drip IV  vancomycin, rocephin, Skin RLE incisions, and Diabetic management yes    Previous Home Environment (from acute therapy documentation) Living Arrangements:  Non-relatives/Friends Available Help at Discharge: Family, Friend(s), Available 24 hours/day Type of Home: House Home Layout: One level Home Access: Level entry Bathroom Shower/Tub: Tub/shower unit (takes sponge baths at sink) Bathroom Toilet: Handicapped height Bathroom Accessibility: Yes How Accessible: Accessible via wheelchair, Accessible via walker Tripp: No Additional Comments: lives with friend; will also have assist from brother and  sister at d/c   Discharge Living Setting Plans for Discharge Living Setting: Patient's home, Lives with (comment) (s/o Clarise Cruz) Type of Home at Discharge: House Discharge Home Layout: One level Discharge Home Access: Level entry Discharge Bathroom Shower/Tub: Tub/shower unit Discharge Bathroom Toilet: Handicapped height Discharge Bathroom Accessibility: Yes How Accessible: Accessible via wheelchair Does the patient have any problems obtaining your medications?: No   Social/Family/Support Systems Anticipated Caregiver: significant other Clarise Cruz, sister Deloris, and brother Anticipated Ambulance person Information: Deloris [phone] Ability/Limitations of Caregiver: min assist mobility Caregiver Availability: 24/7 Discharge Plan Discussed with Primary Caregiver: Yes Is Caregiver In Agreement with Plan?: Yes Does Caregiver/Family have Issues with Lodging/Transportation while Pt is in Rehab?: Yes (no car)   Goals Patient/Family Goal for Rehab: PT/OT min assist w/c level, SL n/a Expected length of stay: 14-16 days Pt/Family Agrees to Admission and willing to participate: Yes Program Orientation Provided & Reviewed with Pt/Caregiver Including Roles  & Responsibilities: Yes  Barriers to Discharge: Insurance for SNF coverage   Decrease burden of Care through IP rehab admission: n/a   Possible need for SNF placement upon discharge: Not anticipated   Patient Condition: I have reviewed medical records from [facility], spoken with CM,  and patient. I met with patient at the bedside for inpatient rehabilitation assessment.  Patient will benefit from ongoing PT and OT, can actively participate in 3 hours of therapy a day 5 days of the week, and can make measurable gains during the admission.  Patient will also benefit from the coordinated team approach during an Inpatient Acute Rehabilitation admission.  The patient will receive intensive therapy as well as Rehabilitation physician, nursing, social worker, and care management interventions.  Due to safety, skin/wound care, disease management, medication administration, pain management, and patient education the patient requires 24 hour a day rehabilitation nursing.  The patient is currently mod/max assist with mobility and basic ADLs.  Discharge setting and therapy post discharge at home with home health is anticipated.  Patient has agreed to participate in the Acute Inpatient Rehabilitation Program and will admit today.   Preadmission Screen Completed By: Shann Medal, PT, DPT and  Michel Santee, 03/08/2022 10:00 AM ______________________________________________________________________   Discussed status with Dr. Ranell Patrick on 03/08/22  at 10:00 AM  and received approval for admission today.   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 Yes Co-Morbidities requiring supervision/potential complications: type 2 diabetes, diabetic foot ulcer, left BKA, essential hypertension, peripheral vascular disease Due to bladder management, bowel management, safety, skin/wound care, disease management, medication administration, pain management, and patient education, does the patient require 24 hr/day rehab nursing? Yes Does  the patient require coordinated care of a physician, rehab nurse, PT, OT to address physical and functional deficits in the context of the above medical diagnosis(es)? Yes Addressing deficits in the following areas: balance, endurance, locomotion, strength, transferring, bowel/bladder control, bathing, dressing, feeding, grooming, toileting, and psychosocial support Can the patient actively participate in an intensive therapy program of at least 3 hrs of therapy 5 days a week? Yes The potential for patient to make measurable gains while on inpatient rehab is excellent Anticipated functional outcomes upon discharge from inpatient rehab: min assist PT, supervision OT, supervision SLP Estimated rehab  length of stay to reach the above functional goals is: 10-14 days Anticipated discharge destination: Home 10. Overall Rehab/Functional Prognosis: excellent     MD Signature: Leeroy Cha, MD

## 2022-03-08 NOTE — H&P (Signed)
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 CTA head/neck was negative for LVO.  MRI brain negative for acute changes and showed moderately advanced chronic changes with small remote left cerebellar infarct.  He was started on broad spectrum antibiotics and podiatry/Dr.Stover as well as Dr. Carlis Abbott were consulted for input as patient not in favor of losing his leg.   He underwent aortogram with R-external iliac artery angioplasty and stent placement on 05/17 followed by right CFA to tibioperoneal trunk bypass with ipsilateral nonreversed great saphenous vein on 05/22 in attempts at limb salvage.  He underwent partial 5th ray amputation of right foot by Dr. Amalia Hailey on 05/24 and to be minimally WB on RLE for transfers only  ID following for input on antibiotic regimen--surgical wound culture positive for rare Group B strep agalactiae and rare Staph aureus. Antibiotics narrowed to Augmentin X 4 weeks with end date of 06/21 and ID follow up at discharge.  PT/OT has been working with patient who continues to be limited by weakness requiring STEDY for standing attempts/WB precautions. CIR recommended due to functional decline. Currently complains of weakness.   Review of Systems  Constitutional:  Negative for chills and fever.  HENT:  Negative for hearing loss.   Eyes:  Negative for blurred vision.  Respiratory:  Negative for cough and sputum production.   Cardiovascular:  Negative for chest  pain and palpitations.  Gastrointestinal:  Negative for heartburn.  Genitourinary:  Negative for frequency and hematuria.  Musculoskeletal:  Negative for myalgias.  Skin:  Negative for rash.  Neurological:  Positive for sensory change and weakness. Negative for dizziness and headaches.  Psychiatric/Behavioral:  The patient does not have insomnia.     Past Medical History:  Diagnosis Date   Coronary artery disease    Diabetes ([name])    DVT (deep venous thrombosis) (HCC)    Gangrene of left foot ([name]) 02/09/2018   GERD (gastroesophageal reflux disease)    Hyperlipidemia    Hypertension    Peripheral vascular disease ([place])     Past Surgical History:  Procedure Laterality Date   ABDOMINAL AORTOGRAM W/LOWER EXTREMITY N/A 02/20/2022   Procedure: ABDOMINAL AORTOGRAM W/LOWER EXTREMITY;  Surgeon: Marty Heck, MD;  Location: [place] [facility];  Service: Cardiovascular;  Laterality: N/A;   AMPUTATION Left 10/28/2018   Procedure: AMPUTATION BELOW KNEE;  Surgeon: Algernon Huxley, MD;  Location: [facility];  Service: Vascular;  Laterality: Left;   AMPUTATION Left 09/10/2021   Procedure: AMPUTATION DIGIT;  Surgeon: Cindra Presume, MD;  [facility];  Service: Plastics;  Laterality: Left;   AMPUTATION Left 09/14/2021   Procedure: AMPUTATION DIGIT left long finger and irrigation and debridement;  Surgeon: Cindra Presume, MD;  [facility];  Service: Plastics;  Laterality: Left;   AMPUTATION Right 02/27/2022   Procedure: RIGHT PARTIAL AMPUTATION FOOT;  Surgeon: Edrick Kins, DPM;  Location: [place];  Service: Podiatry;  Laterality: Right;   AMPUTATION TOE Left 02/10/2018  Procedure: AMPUTATION TOE;  Surgeon: Sharlotte Alamo, DPM;  Location: [facility];  Service: Podiatry;  Laterality: Left;   APPLICATION OF WOUND VAC Left 10/16/2018   Procedure: APPLICATION OF WOUND VAC;  Surgeon: Samara Deist, DPM;  Location: [facility];  Service: Podiatry;  Laterality: Left;   DORSAL SLIT N/A 01/12/2021    Procedure: DORSAL SLIT;  Surgeon: Billey Co, MD;  Location: [facility];  Service: Urology;  Laterality: N/A;   FEMORAL-TIBIAL BYPASS GRAFT Right 02/25/2022   Procedure: RIGHT LOWER EXTREMITY FEMORAL TO TIBIAL PERONEAL TRUNK BYPASS;  Surgeon: Marty Heck, MD;  Location: [place];  Service: Vascular;  Laterality: Right;  INSERT ARTERIAL LINE   groin surgery     IRRIGATION AND DEBRIDEMENT FOOT Left 09/30/2018   Procedure: IRRIGATION AND DEBRIDEMENT FOOT;  Surgeon: Sharlotte Alamo, DPM;  Location: [facility];  Service: Podiatry;  Laterality: Left;   LOWER EXTREMITY ANGIOGRAPHY Left 02/12/2018   Procedure: Lower Extremity Angiography;  Surgeon: Algernon Huxley, MD;  Location: [place] [facility];  Service: Cardiovascular;  Laterality: Left;   LOWER EXTREMITY ANGIOGRAPHY Left 10/01/2018   Procedure: Lower Extremity Angiography;  Surgeon: Algernon Huxley, MD;  Location: [facility] [place] [facility];  Service: Cardiovascular;  Laterality: Left;   LOWER EXTREMITY ANGIOGRAPHY Left 10/19/2018   Procedure: Lower Extremity Angiography;  Surgeon: Algernon Huxley, MD;  Location: [place] [facility];  Service: Cardiovascular;  Laterality: Left;   LOWER EXTREMITY ANGIOGRAPHY Left 10/20/2018   Procedure: LOWER EXTREMITY ANGIOGRAPHY;  Surgeon: Algernon Huxley, MD;  Location: [place] [facility];  Service: Cardiovascular;  Laterality: Left;   LOWER EXTREMITY ANGIOGRAPHY Right 11/25/2018   Procedure: LOWER EXTREMITY ANGIOGRAPHY;  Surgeon: Algernon Huxley, MD;  Location: [place] [facility];  Service: Cardiovascular;  Laterality: Right;   LOWER EXTREMITY ANGIOGRAPHY Right 01/10/2020   Procedure: LOWER EXTREMITY ANGIOGRAPHY;  Surgeon: Algernon Huxley, MD;  Location: [place] [facility];  Service: Cardiovascular;  Laterality: Right;   LOWER EXTREMITY ANGIOGRAPHY Right 12/11/2020   Procedure: LOWER EXTREMITY ANGIOGRAPHY;  Surgeon: Algernon Huxley, MD;  Location: [place] [facility];  Service: Cardiovascular;  Laterality: Right;   LOWER EXTREMITY  INTERVENTION  02/12/2018   Procedure: LOWER EXTREMITY INTERVENTION;  Surgeon: Algernon Huxley, MD;  Location: [place] [facility];  Service: Cardiovascular;;   MINOR IRRIGATION AND DEBRIDEMENT OF WOUND Left 09/10/2021   Procedure: IRRIGATION AND DEBRIDEMENT OF LEFT LONG FINGER WOUND;  Surgeon: Cindra Presume, MD;  [facility];  Service: Plastics;  Laterality: Left;   NECK SURGERY     PERIPHERAL VASCULAR INTERVENTION  02/20/2022   Procedure: PERIPHERAL VASCULAR INTERVENTION;  Surgeon: Marty Heck, MD;  Location: [place] [facility];  Service: Cardiovascular;;  right iliac   TRANSMETATARSAL AMPUTATION Left 10/16/2018   Procedure: TRANSMETATARSAL AMPUTATION LEFT FOOT;  Surgeon: Samara Deist, DPM;  Location: [facility];  Service: Podiatry;  Laterality: Left;   VEIN HARVEST Right 02/25/2022   Procedure: VEIN HARVEST OF RIGHT GREATER SAPHENOUS VEIN;  Surgeon: Marty Heck, MD;  Location: [facility];  Service: Vascular;  Laterality: Right;    Family History  Problem Relation Age of Onset   Diabetes Mother    Coronary artery disease Father    Hypertension Sister    Leukemia Brother     Social History:  Has been living with GF for past 2 years but plans on going to his mobile home in the future. He used Conservation officer, historic buildings in home PTA. Disabled since 75's due  to back injury--used to work as a Training and development officer at [employer]. Sisters help out. He  reports that he quit smoking about 14 months ago. His smoking use included cigarettes. He has a 47.00 pack-year smoking history. He has never used smokeless tobacco, does not drink alcohol and does not use drugs.   Allergies: No Known Allergies   Medications Prior to Admission  Medication Sig Dispense Refill   ACCU-CHEK GUIDE test strip 5 (five) times daily.     apixaban (ELIQUIS) 5 MG TABS tablet Take 1 tablet (5 mg total) by mouth 2 (two) times daily. 60 tablet O   aspirin EC 81 MG tablet Take 1 tablet (81 mg total) by mouth daily. 150 tablet 2    atorvastatin (LIPITOR) 80 MG tablet Take 1 tablet (80 mg total) by mouth daily at 6 PM. 30 tablet 0   Blood Glucose Monitoring Suppl (ACCU-CHEK GUIDE) w/Device KIT 5 (five) times daily.     HUMALOG KWIKPEN 100 UNIT/ML KwikPen Inject 5 Units into the skin 3 (three) times daily before meals. (Patient taking differently: Inject 10 Units into the skin 3 (three) times daily before meals.) 15 mL 11   losartan (COZAAR) 25 MG tablet Take 25 mg by mouth daily.     TOUJEO MAX SOLOSTAR 300 UNIT/ML Solostar Pen Inject 60 Units into the skin at bedtime.     TRUEPLUS 5-BEVEL PEN NEEDLES 31G X 6 MM MISC      TRULICITY 8.85 OY/7.7AJ SOPN Inject 0.75 mg into the skin once a week.        Home: Home Living Family/patient expects to be discharged to:: Private residence Living Arrangements: Non-relatives/Friends Available Help at Discharge: Family, Friend(s), Available 24 hours/day Type of Home: House Home Access: Level entry [place]: One level Bathroom Shower/Tub: Tub/shower unit (takes sponge baths at sink) Bathroom Toilet: Handicapped height Bathroom Accessibility: Yes Home Equipment: Conservation officer, nature (2 wheels), Wheelchair - manual, Tub bench Additional Comments: lives with friend; will also have assist from brother and sister at d/c   Functional History: Prior Function Prior Level of Function : Independent/Modified Independent Mobility Comments: Mod indep ambulating with LLE prosthetic and RW, typically uses manual w/c ADLs Comments: Does bird baths at sink from w/c  Functional Status:  Mobility: Bed Mobility Overal bed mobility: Needs Assistance Bed Mobility: Supine to Sit Rolling: Min guard Supine to sit: Min assist, HOB elevated General bed mobility comments: required assistance raising trunk, pt reliant on bed rail and HOB very elevated while performing Transfers Overall transfer level: Needs assistance Equipment used: Rolling walker (2 wheels), Ambulation equipment used Transfers:  Sit to/from Stand, Bed to chair/wheelchair/BSC Sit to Stand: Mod assist, +2 safety/equipment, +2 physical assistance, From elevated surface Bed to/from chair/wheelchair/BSC transfer type:: Via Lift equipment Stand pivot transfers: Max assist, +2 physical assistance, +2 safety/equipment, From elevated surface  Lateral/Scoot Transfers: Mod assist Transfer via Lift Equipment: Stedy General transfer comment: stood from EOB to [place] with +2 modA from elevated bed height, mod cues for safe UE placement and use of momentum required; totalA to transfer to chair from bed with [facility] for safety.      ADL: ADL Overall ADL's : Needs assistance/impaired Eating/Feeding: Modified independent, Bed level Grooming: Wash/dry hands, Wash/dry face, Supervision/safety, Sitting Grooming Details (indicate cue type and reason): in recliner Upper Body Bathing: Supervision/ safety, Sitting Upper Body Bathing Details (indicate cue type and reason): in recliner Lower Body Bathing: Moderate assistance, Sit to/from stand Lower Body Bathing Details (indicate cue type and reason): able  to bathe legs seated and required max assist for peri area cleaning while standing in Stedy Upper Body Dressing : Minimal assistance, Sitting Upper Body Dressing Details (indicate cue type and reason): in recliner Lower Body Dressing: Moderate assistance, Sitting/lateral leans Lower Body Dressing Details (indicate cue type and reason): able to don left prosthesis and required assistance with orthotic on RLE Toilet Transfer:  (Pt declined as he is currently using bed pan. Discussed importance of functional transfers to assist in increasing activity tolerance/endurance. Pt verbalized understanding/reports frustration w/ difficulty having BM of late & wanting to stay on bedpan.) Toilet Transfer Details (indicate cue type and reason): Pt was Mod A +2 SPT from bed that was raised significantly secondary to WB through R LE during transfers/heel only  orders per podiatry/chart review. Pt required VC/TC Toileting- Clothing Manipulation and Hygiene: Maximal assistance, +2 for physical assistance, +2 for safety/equipment, Cueing for safety, Cueing for sequencing, Sit to/from stand Functional mobility during ADLs: Moderate assistance, Maximal assistance, +2 for physical assistance, +2 for safety/equipment, Cueing for safety, Cueing for sequencing, Rolling walker (2 wheels) (From elevated surface) General ADL Comments: self care performed seated in recliner with patient standing in stedy for peri area cleaning  Cognition: Cognition Overall Cognitive Status: No family/caregiver present to determine baseline cognitive functioning Orientation Level: Oriented X4 Cognition Arousal/Alertness: Awake/alert Behavior During Therapy: WFL for tasks assessed/performed Overall Cognitive Status: No family/caregiver present to determine baseline cognitive functioning Area of Impairment: Attention, Safety/judgement, Awareness, Problem solving, Following commands Current Attention Level: Selective Following Commands: Follows multi-step commands with increased time Safety/Judgement: Decreased awareness of safety, Decreased awareness of deficits Awareness: Emergent Problem Solving: Slow processing, Requires verbal cues General Comments: difficulty maintaining WB precautions despite instructions, pt in wet bed upon therapist arrival to room (purewick on but had leaked?) but also sitting on bed pan with nothing in it.  Physical Exam: Blood pressure 117/88, pulse 90, temperature (!) 97.2 F (36.2 C), temperature source Oral, resp. rate 19, height 6' 4" (1.93 m), weight 98.2 kg, SpO2 98 %.  Vitals and nursing note reviewed.  Constitutional:      Appearance: Normal appearance.  Cardiac: RRR Pulm: breathing comfortably on room air Abdominal:     General: There is no distension.     Tenderness: There is no abdominal tenderness.  Skin:    General: Skin is warm  and dry.     Comments: Dry scabbed ulcer on left tibial tuberosity. Right foot with dry dressing. Old Left 2nd digit amputation site well healed.  Moving extremities well Neurological:     Mental Status: He is alert and oriented to person, place, and time.   Results for orders placed or performed during the hospital encounter of 02/15/22 (from the past 48 hour(s))  Glucose, capillary     Status: Abnormal   Collection Time: 03/06/22 12:12 PM  Result Value Ref Range   Glucose-Capillary 184 (H) 70 - 99 mg/dL    Comment: Glucose reference range applies only to samples taken after fasting for at least 8 hours.  Glucose, capillary     Status: Abnormal   Collection Time: 03/06/22  3:45 PM  Result Value Ref Range   Glucose-Capillary 221 (H) 70 - 99 mg/dL    Comment: Glucose reference range applies only to samples taken after fasting for at least 8 hours.  Glucose, capillary     Status: Abnormal   Collection Time: 03/06/22  9:29 PM  Result Value Ref Range   Glucose-Capillary 203 (H) 70 - 99  mg/dL    Comment: Glucose reference range applies only to samples taken after fasting for at least 8 hours.   Comment 1 Notify RN    Comment 2 Document in Chart   Glucose, capillary     Status: Abnormal   Collection Time: 03/07/22  6:14 AM  Result Value Ref Range   Glucose-Capillary 146 (H) 70 - 99 mg/dL    Comment: Glucose reference range applies only to samples taken after fasting for at least 8 hours.   Comment 1 Notify RN    Comment 2 Document in Chart   Glucose, capillary     Status: Abnormal   Collection Time: 03/07/22 11:51 AM  Result Value Ref Range   Glucose-Capillary 176 (H) 70 - 99 mg/dL    Comment: Glucose reference range applies only to samples taken after fasting for at least 8 hours.   Comment 1 Notify RN    Comment 2 Document in Chart   Glucose, capillary     Status: Abnormal   Collection Time: 03/07/22  4:34 PM  Result Value Ref Range   Glucose-Capillary 180 (H) 70 - 99 mg/dL     Comment: Glucose reference range applies only to samples taken after fasting for at least 8 hours.   Comment 1 Notify RN    Comment 2 Document in Chart   Glucose, capillary     Status: Abnormal   Collection Time: 03/07/22  9:07 PM  Result Value Ref Range   Glucose-Capillary 144 (H) 70 - 99 mg/dL    Comment: Glucose reference range applies only to samples taken after fasting for at least 8 hours.   Comment 1 Notify RN    Comment 2 Document in Chart   Glucose, capillary     Status: Abnormal   Collection Time: 03/08/22  6:20 AM  Result Value Ref Range   Glucose-Capillary 131 (H) 70 - 99 mg/dL    Comment: Glucose reference range applies only to samples taken after fasting for at least 8 hours.   Comment 1 Notify RN    Comment 2 Document in Chart    No results found.    Blood pressure 117/88, pulse 90, temperature (!) 97.2 F (36.2 C), temperature source Oral, resp. rate 19, height 6' 4" (1.93 m), weight 98.2 kg, SpO2 98 %.  Medical Problem List and Plan: 1. Functional deficits secondary to right 5th digit amputation  -patient may shower  -ELOS/Goals: 10-14 days S  Admit to CIR 2.  H/o DVT/Antithrombotics: -DVT/anticoagulation:  Pharmaceutical: Other (comment)--On eliquis  -antiplatelet therapy:  ASA/Plavix 3. Pain Management:  Oxycodone prn.   --Mg 1.9 on 05/31-->will add mag gluconate for supplement 4. Mood: LCSW to follow for evaluation and support.   -antipsychotic agents: N/A 5. Neuropsych: This patient is capable of making decisions on his own behalf. 6. Skin/Wound Care:  Add protein supplement and vitamins to promote wound healing.  -- 7. Fluids/Electrolytes/Nutrition: Monitor I/O. Check CMET in am  --Magnesium added to help with sleep, pain and BP 8. Osteomyelitis Right 5th toe s/p partial amputation: Augmentin with end date of 03/27/22  --follow up with ID after discharge.    --continue to monitor WBC--trending down.  9. CAD: On ASA, Plavix, Cozaar and Lipitor.  M--monitor for symptoms with increase in activity.  10. T2DM: Hgb A1C- 11.8 (was 12.2 09/2021)--followed by PCP/nutritionist in [place]   --on Lantus 30 u BID, Novolog 10 units tid for meal coverage with SSI for elevated BS.   --reports compliance with insulin  regimen/diet PTA.  11. 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  12.  A fib: Monitor HR/BP TID. On Eliquis.  13. Acute blood loss anemia: Recheck CBC in am.  14. Screening for vitamin D deficiency: check vitamin D level tomorrow.  15. Suboptimal magnesium level: start supplement today 16. Screening for B12 deficiency: B12 reviewed and is normal at 746 on 02/16/22.    I have personally performed a face to face diagnostic evaluation, including, but not limited to relevant history and physical exam findings, of this patient and developed relevant assessment and plan.  Additionally, I have reviewed and concur with the physician assistant's documentation above.  Leeroy Cha, MD  Bary Leriche, PA-C 03/08/2022

## 2022-03-08 NOTE — TOC Transition Note (Signed)
Transition of Care [facility]) - CM/SW Discharge Note   Patient Details  Name: Austin [name] MRN: [id number] Date of Birth: 1956/02/09  [facility] CM/SW Contact:  Lockie Pares, RN Phone Number: 03/08/2022, 9:44 AM   Clinical Narrative:     Patient is discharged to go to CIR today, appeal approved. Awaiting a bed to open up in CIR TOC signing off, please p[lace consult for any further needs Final next level of care: IP Rehab Facility Barriers to Discharge: No Barriers Identified   Patient Goals and CMS Choice        Discharge Placement        CIR               Discharge Plan and Services                                     Social Determinants of Health (SDOH) Interventions     Readmission Risk Interventions     View : No data to display.

## 2022-03-09 DIAGNOSIS — L97509 Non-pressure chronic ulcer of other part of unspecified foot with unspecified severity: Secondary | ICD-10-CM

## 2022-03-09 DIAGNOSIS — M79671 Pain in right foot: Secondary | ICD-10-CM

## 2022-03-09 DIAGNOSIS — E559 Vitamin D deficiency, unspecified: Secondary | ICD-10-CM

## 2022-03-09 DIAGNOSIS — Z794 Long term (current) use of insulin: Secondary | ICD-10-CM

## 2022-03-09 DIAGNOSIS — Z89431 Acquired absence of right foot: Secondary | ICD-10-CM

## 2022-03-09 DIAGNOSIS — E11621 Type 2 diabetes mellitus with foot ulcer: Secondary | ICD-10-CM

## 2022-03-09 DIAGNOSIS — Z1321 Encounter for screening for nutritional disorder: Secondary | ICD-10-CM

## 2022-03-09 DIAGNOSIS — E1110 Type 2 diabetes mellitus with ketoacidosis without coma: Secondary | ICD-10-CM

## 2022-03-09 DIAGNOSIS — I10 Essential (primary) hypertension: Secondary | ICD-10-CM

## 2022-03-09 LAB — GLUCOSE, CAPILLARY
Glucose-Capillary: 199 mg/dL — ABNORMAL HIGH (ref 70–99)
Glucose-Capillary: 172 mg/dL — ABNORMAL HIGH (ref 70–99)
Glucose-Capillary: 187 mg/dL — ABNORMAL HIGH (ref 70–99)
Glucose-Capillary: 228 mg/dL — ABNORMAL HIGH (ref 70–99)

## 2022-03-09 LAB — CBC WITH DIFFERENTIAL/PLATELET
Abs Immature Granulocytes: 0.08 10*3/uL — ABNORMAL HIGH (ref 0.00–0.07)
Basophils Absolute: 0 10*3/uL (ref 0.0–0.1)
Basophils Relative: 0 %
Eosinophils Absolute: 0.1 10*3/uL (ref 0.0–0.5)
Eosinophils Relative: 1 %
HCT: 38.1 % — ABNORMAL LOW (ref 39.0–52.0)
Hemoglobin: 12.4 g/dL — ABNORMAL LOW (ref 13.0–17.0)
Immature Granulocytes: 1 %
Lymphocytes Relative: 25 %
Lymphs Abs: 2.6 10*3/uL (ref 0.7–4.0)
MCH: 27.4 pg (ref 26.0–34.0)
MCHC: 32.5 g/dL (ref 30.0–36.0)
MCV: 84.3 fL (ref 80.0–100.0)
Monocytes Absolute: 0.8 10*3/uL (ref 0.1–1.0)
Monocytes Relative: 8 %
Neutro Abs: 6.6 10*3/uL (ref 1.7–7.7)
Neutrophils Relative %: 65 %
Platelets: 341 10*3/uL (ref 150–400)
RBC: 4.52 MIL/uL (ref 4.22–5.81)
RDW: 15.2 % (ref 11.5–15.5)
WBC: 10.2 10*3/uL (ref 4.0–10.5)
nRBC: 0 % (ref 0.0–0.2)

## 2022-03-09 LAB — COMPREHENSIVE METABOLIC PANEL
ALT: 32 U/L (ref 0–44)
AST: 22 U/L (ref 15–41)
Albumin: 2.6 g/dL — ABNORMAL LOW (ref 3.5–5.0)
Alkaline Phosphatase: 72 U/L (ref 38–126)
Anion gap: 9 (ref 5–15)
BUN: 10 mg/dL (ref 8–23)
CO2: 27 mmol/L (ref 22–32)
Calcium: 9.3 mg/dL (ref 8.9–10.3)
Chloride: 105 mmol/L (ref 98–111)
Creatinine, Ser: 0.88 mg/dL (ref 0.61–1.24)
GFR, Estimated: >60 mL/min (ref >60)
Glucose, Bld: 218 mg/dL — ABNORMAL HIGH (ref 70–99)
Potassium: 4.2 mmol/L (ref 3.5–5.1)
Sodium: 141 mmol/L (ref 135–145)
Total Bilirubin: 0.2 mg/dL — ABNORMAL LOW (ref 0.3–1.2)
Total Protein: 7.6 g/dL (ref 6.5–8.1)

## 2022-03-09 LAB — VITAMIN D 25 HYDROXY (VIT D DEFICIENCY, FRACTURES): Vit D, 25-Hydroxy: 25.97 ng/mL — ABNORMAL LOW (ref 30–100)

## 2022-03-09 MED ORDER — VITAMIN D (ERGOCALCIFEROL) 1.25 MG (50000 UNIT) PO CAPS
50000.0000 [IU] | ORAL_CAPSULE | ORAL | Status: DC
  Administered 2022-03-09 – 2022-03-16 (×2): 50000 [IU] via ORAL
  Filled 2022-03-09 (×2): qty 1

## 2022-03-09 MED ORDER — INSULIN GLARGINE-YFGN 100 UNIT/ML ~~LOC~~ SOLN
31.0000 [IU] | Freq: Two times a day (BID) | SUBCUTANEOUS | Status: DC
Start: 2022-03-09 — End: 2022-03-10
  Administered 2022-03-09 – 2022-03-10 (×2): 31 [IU] via SUBCUTANEOUS
  Filled 2022-03-09 (×4): qty 0.31

## 2022-03-09 NOTE — Progress Notes (Signed)
Occupational Therapy Session Note  Patient Details  Name: Austin [name] MRN: [id number] Date of Birth: Nov 04, 1955  Today's Date: 03/09/2022 OT Individual Time: 1330-1430 OT Individual Time Calculation (min): 60 min    Short Term Goals: Week 1:  OT Short Term Goal 1 (Week 1): Pt will don pull up underwear with min A. OT Short Term Goal 2 (Week 1): Pt will don pants with min A. OT Short Term Goal 3 (Week 1): Pt will don DARCO shoe with min A. OT Short Term Goal 4 (Week 1): Pt will toilet with min A. Week 2:     Skilled Therapeutic Interventions/Progress Updates:   Patient seated at w/c LOF upon arrival, the pt initially completed a simulated task in LB dressing using theraband to improve his  safely and independence.  The pt was instructed on how to best approach the task, donning the side that is most impaired first, the pt required verbal prompts and  demonstration , but was able to demonstrate carryover with SBA while positioned on the mat.   The pt complete UB exercises to improve his strength for functional transfers while seated on the mat by pushing downward and scooting the length of the mat  repositioning his bottom to improve his UB strength in preparation for functional task performance.  The pt was able to roll himself from the gym to his room with only 1 rest break.Upon arriving at his room, the pt asked to remain at w/c LOF with his call light and bedside table within reach.  The chair alarm was activated and all additional needs were addressed.  The pt had no c/o pain this treatment session.    Balance/vestibular training;Discharge Orthoptist;Functional mobility training;Patient/family education;Self Care/advanced ADL retraining;Therapeutic Activities;Therapeutic Exercise   Therapy Documentation Precautions:  Precautions Precautions: Fall, Other (comment) Precaution Comments: H/o L BKA (prosthetic in room) Other Brace: RLE darco shoe (in  room) Restrictions Weight Bearing Restrictions: Yes RLE Weight Bearing: Partial weight bearing Other Position/Activity Restrictions: minimal WB through R heel for transfers only wearing DARCO shoe   Therapy/Group: Individual Therapy  Yvonne Kendall 03/09/2022, 4:07 PM

## 2022-03-09 NOTE — Plan of Care (Signed)
  Problem: RH Balance Goal: LTG Patient will maintain dynamic sitting balance (PT) Description: LTG:  Patient will maintain dynamic sitting balance with assistance during mobility activities (PT) Flowsheets (Taken 03/09/2022 2020) LTG: Pt will maintain dynamic sitting balance during mobility activities with:: Independent with assistive device  Goal: LTG Patient will maintain dynamic standing balance (PT) Description: LTG:  Patient will maintain dynamic standing balance with assistance during mobility activities (PT) Flowsheets (Taken 03/09/2022 2020) LTG: Pt will maintain dynamic standing balance during mobility activities with:: Supervision/Verbal cueing   Problem: Sit to Stand Goal: LTG:  Patient will perform sit to stand with assistance level (PT) Description: LTG:  Patient will perform sit to stand with assistance level (PT) Flowsheets (Taken 03/09/2022 2020) LTG: PT will perform sit to stand in preparation for functional mobility with assistance level: Supervision/Verbal cueing   Problem: RH Bed to Chair Transfers Goal: LTG Patient will perform bed/chair transfers w/assist (PT) Description: LTG: Patient will perform bed to chair transfers with assistance (PT). Flowsheets (Taken 03/09/2022 2020) LTG: Pt will perform Bed to Chair Transfers with assistance level: Supervision/Verbal cueing   Problem: RH Car Transfers Goal: LTG Patient will perform car transfers with assist (PT) Description: LTG: Patient will perform car transfers with assistance (PT). Flowsheets (Taken 03/09/2022 2020) LTG: Pt will perform car transfers with assist:: Supervision/Verbal cueing   Problem: RH Wheelchair Mobility Goal: LTG Patient will propel w/c in controlled environment (PT) Description: LTG: Patient will propel wheelchair in controlled environment, # of feet with assist (PT) Flowsheets (Taken 03/09/2022 2020) LTG: Pt will propel w/c in controlled environ  assist needed:: Independent with assistive device LTG:  Propel w/c distance in controlled environment: 131ft Goal: LTG Patient will propel w/c in home environment (PT) Description: LTG: Patient will propel wheelchair in home environment, # of feet with assistance (PT). Flowsheets (Taken 03/09/2022 2020) LTG: Pt will propel w/c in home environ  assist needed:: Independent with assistive device LTG: Propel w/c distance in home environment: 12ft

## 2022-03-09 NOTE — Progress Notes (Signed)
PROGRESS NOTE   Subjective/Complaints: Had a good first night here Eating his lunch Discussed vitamin D deficiency and elevated CBGs.   ROS: +right foot pain   Objective:   No results found. Recent Labs    03/09/22 0525  WBC 10.2  HGB 12.4*  HCT 38.1*  PLT 341   Recent Labs    03/09/22 0525  NA 141  K 4.2  CL 105  CO2 27  GLUCOSE 218*  BUN 10  CREATININE 0.88  CALCIUM 9.3    Intake/Output Summary (Last 24 hours) at 03/09/2022 0759 Last data filed at 03/09/2022 0349 Gross per 24 hour  Intake 360 ml  Output 1350 ml  Net -990 ml        Physical Exam: Vital Signs Blood pressure 131/67, pulse 79, temperature 98.1 F (36.7 C), resp. rate 18, height 6\' 4"  (1.93 m), weight 98.4 kg, SpO2 99 %. Gen: no distress, normal appearing HEENT: oral mucosa pink and moist, NCAT Cardio: Reg rate Chest: normal effort, normal rate of breathing Abd: soft, non-distended Ext: no edema Psych: pleasant, normal affect Skin:    General: Skin is warm and dry.     Comments: Dry scabbed ulcer on left tibial tuberosity. Right foot with dry dressing. Old Left 2nd digit amputation site well healed.  Moving extremities well Neurological:     Mental Status: He is alert and oriented to person, place, and time.    Assessment/Plan: 1. Functional deficits which require 3+ hours per day of interdisciplinary therapy in a comprehensive inpatient rehab setting. Physiatrist is providing close team supervision and 24 hour management of active medical problems listed below. Physiatrist and rehab team continue to assess barriers to discharge/monitor patient progress toward functional and medical goals  Care Tool:  Bathing              Bathing assist       Upper Body Dressing/Undressing Upper body dressing        Upper body assist      Lower Body Dressing/Undressing Lower body dressing            Lower body assist        Toileting Toileting    Toileting assist Assist for toileting: Set up assist Assistive Device Comment: Urinal   Transfers Chair/bed transfer  Transfers assist           Locomotion Ambulation   Ambulation assist              Walk 10 feet activity   Assist           Walk 50 feet activity   Assist           Walk 150 feet activity   Assist           Walk 10 feet on uneven surface  activity   Assist           Wheelchair     Assist               Wheelchair 50 feet with 2 turns activity    Assist            Wheelchair 150 feet activity  Assist          Blood pressure 131/67, pulse 79, temperature 98.1 F (36.7 C), resp. rate 18, height 6\' 4"  (1.93 m), weight 98.4 kg, SpO2 99 %.  Medical Problem List and Plan: 1. Functional deficits secondary to right 5th digit amputation             -patient may shower             -ELOS/Goals: 10-14 days S             Initial CIR evaluations today 2.  H/o DVT/Antithrombotics: -DVT/anticoagulation:  Pharmaceutical: Other (comment)--On eliquis             -antiplatelet therapy:  ASA/Plavix 3. Pain Management:  Oxycodone prn. Provided with pain relief journal 4. Mood: LCSW to follow for evaluation and support.              -antipsychotic agents: N/A 5. Neuropsych: This patient is capable of making decisions on his own behalf. 6. Skin/Wound Care:  Add protein supplement and vitamins to promote wound healing. 7. Fluids/Electrolytes/Nutrition: Monitor I/O. Check CMET in am             --Magnesium added to help with sleep, pain and BP 8. Osteomyelitis Right 5th toe s/p partial amputation: Augmentin with end date of 03/27/22  --follow up with ID after discharge.               --continue to monitor WBC--trending down.  9. CAD: On ASA, Plavix, Cozaar and Lipitor. M--monitor for symptoms with increase in activity.  10. T2DM: Hgb A1C- 11.8 (was 12.2 09/2021)--followed by  PCP/nutritionist in [place]              --continue Novolog 10 units tid for meal coverage with SSI for elevated BS.   Increase Lantus to 31U BID             --reports compliance with insulin regimen/diet PTA.  11. 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  12.  A fib: Monitor HR/BP TID. On Eliquis.  13. Acute blood loss anemia: Recheck CBC in am.  14. Vitamin D deficiency: start ergocalciferol 50,000 U once per week for 7 weeks 15. Suboptimal magnesium level: start supplement today 16. Screening for B12 deficiency: B12 reviewed and is normal at 746 on 02/16/22  LOS: 1 days A FACE TO FACE EVALUATION WAS PERFORMED  Austin [name] 03/09/2022, 7:59 AM

## 2022-03-09 NOTE — Progress Notes (Signed)
Physical Therapy Session Note  Patient Details  Name: Austin [name] MRN: [id number] Date of Birth: Dec 27, 1955  Today's Date: 03/09/2022 PT Individual Time: 1500-1530 PT Individual Time Calculation (min): 30 min   Short Term Goals: Week 1:  PT Short Term Goal 1 (Week 1): Pt will perform supine<>sit with supervision PT Short Term Goal 2 (Week 1): Pt will perform bed<>chair transfers using LRAD simulating home set-up with CGA PT Short Term Goal 3 (Week 1): Pt will perform real-life car transfer with min assist PT Short Term Goal 4 (Week 1): Pt will demonstrate ability to set-up transfers managing w/c parts with only intermittent cuing  Skilled Therapeutic Interventions/Progress Updates:  Pt presents sitting in w/c and agreeable to therapy.  Discussed prosthesis w/ opening at tib tub 2/2 "rubbing" and padding/bandage placed .  Pt states prosthetist opened for pressure relief.  Pt and friend states has not been ambulatory approximately 1 year.  Elevated leg rest applied to w/c for maintaining knee extension.  Pt negotiated w/c x 100' w/ supervision but verbal cues required to avoid objects to right.  Pt negotiated ramp w/ mod A for ascending, min descending.  Pt required seated rest break and then able to negotiate w/c x 50', 10' c/o back pain.  PT completed return into room.  Chair alarm on and all needs in reach.  Therapy Documentation Precautions:  Precautions Precautions: Fall, Other (comment) Precaution Comments: H/o L BKA (prosthetic in room) Other Brace: RLE darco shoe (in room) Restrictions Weight Bearing Restrictions: Yes RLE Weight Bearing: Partial weight bearing Other Position/Activity Restrictions: minimal WB through R heel for transfers only wearing DARCO shoe General:   Vital Signs: Therapy Vitals Temp: (!) 97.5 F (36.4 C) Temp Source: Oral Pulse Rate: 73 Resp: 18 BP: 96/75 Patient Position (if appropriate): Sitting Oxygen Therapy SpO2: 97 % O2 Device: Room  Air Pain:8/10 back.   Mobility: Bed Mobility Bed Mobility: Supine to Sit;Sit to Supine Supine to Sit: Supervision/Verbal cueing Sit to Supine: Minimal Assistance - Patient > 75% (requires assist for LE management onto bed) Transfers Transfers: Sit to Stand;Stand to Sit;Squat Pivot Transfers Sit to Stand: Minimal Assistance - Patient > 75% (in // bars) Stand to Sit: Minimal Assistance - Patient > 75% (in // bars) Squat Pivot Transfers: Supervision/Verbal cueing;Contact Guard/Touching assist Transfer (Assistive device): Other (Comment) (// bars) Locomotion : Gait Ambulation: No Gait Gait: No Stairs / Additional Locomotion Stairs: No Corporate treasurer: Yes Wheelchair Assistance: Set up Education officer, museum: Both upper extremities Wheelchair Parts Management: Needs assistance Distance: 176ft  Trunk/Postural Assessment : Cervical Assessment Cervical Assessment: Within Functional Limits Thoracic Assessment Thoracic Assessment: Exceptions to [facility] (thoracic rounding) Lumbar Assessment Lumbar Assessment: Exceptions to [facility] (posterior pelvic tilt) Postural Control Postural Control: Deficits on evaluation  Balance: Balance Balance Assessed: Yes Static Sitting Balance Static Sitting - Balance Support: Feet supported Static Sitting - Level of Assistance: 6: Modified independent (Device/Increase time) Dynamic Sitting Balance Dynamic Sitting - Balance Support: Feet supported Dynamic Sitting - Level of Assistance: 5: Stand by assistance Static Standing Balance Static Standing - Balance Support: During functional activity;Bilateral upper extremity supported (using // bars) Static Standing - Level of Assistance: 4: Min assist Dynamic Standing Balance Dynamic Standing - Balance Support: During functional activity;Bilateral upper extremity supported Dynamic Standing - Level of Assistance: 2: Max assist;3: Mod assist Exercises:   Other Treatments:       Therapy/Group: Individual Therapy  Lucio Edward 03/09/2022, 3:44 PM

## 2022-03-09 NOTE — Evaluation (Signed)
Physical Therapy Assessment and Plan  Patient Details  Name: Austin [name] MRN: [id number] Date of Birth: 09-11-1956  PT Diagnosis: Abnormality of gait, Difficulty walking, Impaired sensation, and Muscle weakness Rehab Potential: Good ELOS: 10-12 days   Today's Date: 03/09/2022 PT Individual Time: 1105-1204 PT Individual Time Calculation (min): 75 min    Hospital Problem: Principal Problem:   S/P transmetatarsal amputation of foot ([place])   Past Medical History:  Past Medical History:  Diagnosis Date   Coronary artery disease    Diabetes ([place])    DVT (deep venous thrombosis) ([place])    Gangrene of left foot ([place]) 02/09/2018   GERD (gastroesophageal reflux disease)    Hyperlipidemia    Hypertension    Peripheral vascular disease ([place])    Past Surgical History:  Past Surgical History:  Procedure Laterality Date   ABDOMINAL AORTOGRAM W/LOWER EXTREMITY N/A 02/20/2022   Procedure: ABDOMINAL AORTOGRAM W/LOWER EXTREMITY;  Surgeon: Marty Heck, MD;  Location: [facility];  Service: Cardiovascular;  Laterality: N/A;   AMPUTATION Left 10/28/2018   Procedure: AMPUTATION BELOW KNEE;  Surgeon: Algernon Huxley, MD;  Location: [facility];  Service: Vascular;  Laterality: Left;   AMPUTATION Left 09/10/2021   Procedure: AMPUTATION DIGIT;  Surgeon: Cindra Presume, MD;  Location: [facility];  Service: Plastics;  Laterality: Left;   AMPUTATION Left 09/14/2021   Procedure: AMPUTATION DIGIT left long finger and irrigation and debridement;  Surgeon: Cindra Presume, MD;  Location: [facility];  Service: Plastics;  Laterality: Left;   AMPUTATION Right 02/27/2022   Procedure: RIGHT PARTIAL AMPUTATION FOOT;  Surgeon: Edrick Kins, DPM;  Location: [facility];  Service: Podiatry;  Laterality: Right;   AMPUTATION TOE Left 02/10/2018   Procedure: AMPUTATION TOE;  Surgeon: Sharlotte Alamo, DPM;  Location: [facility];  Service: Podiatry;  Laterality: Left;   APPLICATION OF WOUND VAC Left 10/16/2018   Procedure:  APPLICATION OF WOUND VAC;  Surgeon: Samara Deist, DPM;  Location: [facility];  Service: Podiatry;  Laterality: Left;   DORSAL SLIT N/A 01/12/2021   Procedure: DORSAL SLIT;  Surgeon: Billey Co, MD;  Location: [facility];  Service: Urology;  Laterality: N/A;   FEMORAL-TIBIAL BYPASS GRAFT Right 02/25/2022   Procedure: RIGHT LOWER EXTREMITY FEMORAL TO TIBIAL PERONEAL TRUNK BYPASS;  Surgeon: Marty Heck, MD;  Location: [facility];  Service: Vascular;  Laterality: Right;  INSERT ARTERIAL LINE   groin surgery     IRRIGATION AND DEBRIDEMENT FOOT Left 09/30/2018   Procedure: IRRIGATION AND DEBRIDEMENT FOOT;  Surgeon: Sharlotte Alamo, DPM;  Location: [facility];  Service: Podiatry;  Laterality: Left;   LOWER EXTREMITY ANGIOGRAPHY Left 02/12/2018   Procedure: Lower Extremity Angiography;  Surgeon: Algernon Huxley, MD;  Location: [facility];  Service: Cardiovascular;  Laterality: Left;   LOWER EXTREMITY ANGIOGRAPHY Left 10/01/2018   Procedure: Lower Extremity Angiography;  Surgeon: Algernon Huxley, MD;  Location: [facility];  Service: Cardiovascular;  Laterality: Left;   LOWER EXTREMITY ANGIOGRAPHY Left 10/19/2018   Procedure: Lower Extremity Angiography;  Surgeon: Algernon Huxley, MD;  Location: [facility];  Service: Cardiovascular;  Laterality: Left;   LOWER EXTREMITY ANGIOGRAPHY Left 10/20/2018   Procedure: LOWER EXTREMITY ANGIOGRAPHY;  Surgeon: Algernon Huxley, MD;  Location: [facility];  Service: Cardiovascular;  Laterality: Left;   LOWER EXTREMITY ANGIOGRAPHY Right 11/25/2018   Procedure: LOWER EXTREMITY ANGIOGRAPHY;  Surgeon: Algernon Huxley, MD;  Location: [facility];  Service:  Cardiovascular;  Laterality: Right;   LOWER EXTREMITY ANGIOGRAPHY Right 01/10/2020   Procedure: LOWER EXTREMITY ANGIOGRAPHY;  Surgeon: Algernon Huxley, MD;  Location: [facility];  Service: Cardiovascular;  Laterality: Right;   LOWER EXTREMITY ANGIOGRAPHY Right 12/11/2020   Procedure: LOWER  EXTREMITY ANGIOGRAPHY;  Surgeon: Algernon Huxley, MD;  Location: [facility];  Service: Cardiovascular;  Laterality: Right;   LOWER EXTREMITY INTERVENTION  02/12/2018   Procedure: LOWER EXTREMITY INTERVENTION;  Surgeon: Algernon Huxley, MD;  Location: [facility];  Service: Cardiovascular;;   MINOR IRRIGATION AND DEBRIDEMENT OF WOUND Left 09/10/2021   Procedure: IRRIGATION AND DEBRIDEMENT OF LEFT LONG FINGER WOUND;  Surgeon: Cindra Presume, MD;  [facility];  Service: Plastics;  Laterality: Left;   NECK SURGERY     PERIPHERAL VASCULAR INTERVENTION  02/20/2022   Procedure: PERIPHERAL VASCULAR INTERVENTION;  Surgeon: Marty Heck, MD;  Location: [facility];  Service: Cardiovascular;;  right iliac   TRANSMETATARSAL AMPUTATION Left 10/16/2018   Procedure: TRANSMETATARSAL AMPUTATION LEFT FOOT;  Surgeon: Samara Deist, DPM;  Location: [facility];  Service: Podiatry;  Laterality: Left;   VEIN HARVEST Right 02/25/2022   Procedure: VEIN HARVEST OF RIGHT GREATER SAPHENOUS VEIN;  Surgeon: Marty Heck, MD;  [facility];  Service: Vascular;  Laterality: Right;    Assessment & Plan Clinical Impression: Patient is a 66 y.o. 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 CTA head/neck was negative for LVO.  MRI brain negative for acute changes and showed moderately advanced chronic changes with small remote left cerebellar infarct.  He was started on broad spectrum antibiotics and podiatry/Dr.Stover as well as Dr. Carlis Abbott were consulted for input as patient not in favor of losing his leg.    He underwent aortogram with R-external iliac artery  angioplasty and stent placement on 05/17 followed by right CFA to tibioperoneal trunk bypass with ipsilateral nonreversed great saphenous vein on 05/22 in attempts at limb salvage.  He underwent partial 5th ray amputation of right foot by Dr. Amalia Hailey on 05/24 and to be minimally WB on RLE for transfers only  ID following for input on antibiotic regimen--surgical wound culture positive for rare Group B strep agalactiae and rare Staph aureus. Antibiotics narrowed to Augmentin X 4 weeks with end date of 06/21 and ID follow up at discharge.  PT/OT has been working with patient who continues to be limited by weakness requiring STEDY for standing attempts/WB precautions. CIR recommended due to functional decline. Currently complains of weakness. Patient transferred to CIR on 03/08/2022 .   Patient currently requires min assist with mobility at wheelchair level secondary to muscle weakness and muscle joint tightness, decreased cardiorespiratoy endurance, and decreased standing balance, decreased postural control, decreased balance strategies, and difficulty maintaining precautions.  Prior to hospitalization, patient was modified independent  with mobility and lived with Friend(s) (friend, [name]) in a House home.  Home access is  Stairs to enter, Ramped entrance (pt says they have portable ramp to place over stairs).  Patient will benefit from skilled PT intervention to maximize safe functional mobility, minimize fall risk, and decrease caregiver burden for planned discharge home with 24hour  supervision.  Anticipate patient will benefit from follow up [place] at discharge.  PT - End of Session Activity Tolerance: Tolerates 30+ min activity with multiple rests Endurance Deficit: Yes Endurance Deficit Description: requires intermittent rest breaks PT Assessment Rehab Potential (ACUTE/IP ONLY): Good PT Barriers to Discharge: Weight bearing restrictions PT Patient demonstrates impairments in the following area(s):  Balance;Safety;Sensory;Skin Integrity;Endurance;Motor;Nutrition;Pain;Edema PT Transfers Functional Problem(s): Bed Mobility;Bed to Chair;Car;Furniture PT Locomotion Functional Problem(s): Ambulation;Wheelchair Mobility;Stairs PT Plan PT Intensity: Minimum of 1-2 x/day ,45 to 90 minutes PT Frequency: 5 out of 7 days PT Duration Estimated Length of Stay: 10-12 days PT Treatment/Interventions: Ambulation/gait training;Community reintegration;DME/adaptive equipment instruction;Neuromuscular re-education;Psychosocial support;Stair training;UE/LE Strength taining/ROM;Wheelchair propulsion/positioning;Balance/vestibular training;Discharge planning;Functional electrical stimulation;Pain management;Skin care/wound management;Therapeutic Activities;UE/LE Coordination activities;Disease management/prevention;Functional mobility training;Patient/family education;Splinting/orthotics;Therapeutic Exercise;Visual/perceptual remediation/compensation PT Transfers Anticipated Outcome(s): supervision PT Locomotion Anticipated Outcome(s): due to [place] restrictions and 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 pt not safe to ambulate PT Recommendation Follow Up Recommendations: Home health PT;24 hour supervision/assistance Patient destination: Home Equipment Recommended: To be determined   PT Evaluation Precautions/Restrictions  Precautions Precautions: Fall;Other (comment) Precaution Comments: H/o L BKA (prosthetic in room) Other Brace: RLE darco shoe (in room) Restrictions Weight Bearing Restrictions: Yes RLE Weight Bearing: Partial weight bearing Other Position/Activity Restrictions: minimal WB through R heel for transfers only wearing DARCO shoe Pain Pain Assessment Pain Scale: 0-10 Pain Score: 0-No pain Pain Interference Pain Interference Pain Effect on Sleep: 0. Does not apply - I have not had any pain or hurting in the past 5 days Pain Interference with Therapy Activities: 1. Rarely or not at  all Pain Interference with Day-to-Day Activities: 1. Rarely or not at all Home Living/Prior [place] Available Help at Discharge: Friend(s);Available 24 hours/day;Family (friend, [name], he lives with is retired & can provide 24hr support) Type of Home: House Home Access: Stairs to enter;Ramped entrance (pt says they have portable ramp to place over stairs) Entrance Stairs-Number of Steps: 2 Entrance Stairs-Rails: None Home Layout: One level Bathroom Shower/Tub: Chiropodist: Handicapped height Additional Comments: lives with friend at her house; will also have assist from brother and sister at d/c (he has a mobile home but not staying there)  Lives With: Friend(s) (friend, [name]) Prior Function Level of Independence: Needs assistance with homemaking;Requires assistive device for independence;Independent with transfers;Independent with gait (mod-I using manual wheelchair in the home and intermittently walking with RW)  Able to Take Stairs?: Yes (mod-I up the 2 STE house using RW) Driving: No (can drive but doesn't have a car) Vocation: Retired Vision/Perception  Vision - History Ability to See in Adequate Light: 1 Impaired Perception Perception: Within Functional Limits Praxis Praxis: Intact  Cognition Overall Cognitive Status: Within Functional Limits for tasks assessed Arousal/Alertness: Awake/alert Orientation Level: Oriented to person;Oriented to place;Oriented to situation;Disoriented to time (initially states it is May of 2003 with ability to correct month but not year with cuing) Year: Other (Comment) (2003) Month: June Day of Week: Correct Attention: Focused;Sustained Focused Attention: Appears intact Sustained Attention: Appears intact Memory: Appears intact Safety/Judgment: Appears intact Sensation Sensation Light Touch: Impaired Detail Peripheral sensation comments: limited access to R ankle/foot due to bandage but absent sensation  in toes; feels light touch on screen to L LE Light Touch Impaired Details: Impaired RLE Hot/Cold: Not tested Proprioception: Not tested Stereognosis: Not tested Coordination Gross Motor Movements are Fluid and Coordinated: No Coordination and Movement Description: generalized weakness and deconditioning with R LE WBing restrictions impacting movements Motor  Motor Motor:  Other (comment) Motor - Skilled Clinical Observations: generalized weakness and deconditioning with prior L LE BKA and new R LE WBing restrictions   Trunk/Postural Assessment  Cervical Assessment Cervical Assessment: Within Functional Limits Thoracic Assessment Thoracic Assessment: Exceptions to [facility] (thoracic rounding) Lumbar Assessment Lumbar Assessment: Exceptions to [facility] (posterior pelvic tilt) Postural Control Postural Control: Deficits on evaluation  Balance Balance Balance Assessed: Yes Static Sitting Balance Static Sitting - Balance Support: Feet supported Static Sitting - Level of Assistance: 6: Modified independent (Device/Increase time) Dynamic Sitting Balance Dynamic Sitting - Balance Support: Feet supported Dynamic Sitting - Level of Assistance: 5: Stand by assistance Static Standing Balance Static Standing - Balance Support: During functional activity;Bilateral upper extremity supported (using // bars) Static Standing - Level of Assistance: 4: Min assist Dynamic Standing Balance Dynamic Standing - Balance Support: During functional activity;Bilateral upper extremity supported Dynamic Standing - Level of Assistance: 2: Max assist;3: Mod assist Extremity Assessment      RLE Assessment RLE Assessment: Exceptions to [facility] Passive Range of Motion (PROM) Comments: decreased R knee extension ROM in sitting due to hamstring tightness, decreased ankle DF/PF ROM but [facility] General Strength Comments: assessed in sitting RLE Strength Right Hip Flexion: 2/5 Right Hip ABduction: 2/5 Right Hip ADduction:  2/5 Right Knee Flexion: 3/5 Right Knee Extension: 3+/5 (through available range) Right Ankle Dorsiflexion: 3-/5 (did not apply resistance but able to move against gravity) Right Ankle Plantar Flexion: 3-/5 (did not apply resistance) LLE Assessment LLE Assessment: Exceptions to [facility] Active Range of Motion (AROM) Comments: WFL General Strength Comments: assessed in sitting without wearing prosthetic LLE Strength Left Hip Flexion: 3+/5 Left Hip Extension: 3+/5 Left Hip ABduction: 3-/5 Left Hip ADduction: 3-/5 Left Knee Flexion: 3/5 (did not apply resistance due to short residual limb and not currently wearing prosthetic) Left Knee Extension: 3/5 (did not apply resistance due to short residual limb and not currently wearing prosthetic)  Care Tool Care Tool Bed Mobility Roll left and right activity   Roll left and right assist level: Minimal Assistance - Patient > 75%    Sit to lying activity   Sit to lying assist level: Minimal Assistance - Patient > 75%    Lying to sitting on side of bed activity   Lying to sitting on side of bed assist level: the ability to move from lying on the back to sitting on the side of the bed with no back support.: Contact Guard/Touching assist     Care Tool Transfers Sit to stand transfer Sit to stand activity did not occur: Safety/medical concerns (requires // bars)      Chair/bed transfer   Chair/bed transfer assist level: Contact Guard/Touching assist (squat pivot  )     Psychologist, counselling transfer activity did not occur: Safety/medical concerns (car height)        Care Tool Locomotion Ambulation Ambulation activity did not occur: Safety/medical concerns        Walk 10 feet activity Walk 10 feet activity did not occur: Safety/medical concerns       Walk 50 feet with 2 turns activity Walk 50 feet with 2 turns activity did not occur: Safety/medical concerns      Walk 150 feet activity Walk 150 feet activity did not  occur: Safety/medical concerns      Walk 10 feet on uneven surfaces activity Walk 10 feet on uneven surfaces activity did not occur: Safety/medical concerns      Stairs Stair  activity did not occur: Safety/medical concerns        Walk up/down 1 step activity Walk up/down 1 step or curb (drop down) activity did not occur: Safety/medical concerns      Walk up/down 4 steps activity Walk up/down 4 steps activity did not occur: Safety/medical concerns      Walk up/down 12 steps activity Walk up/down 12 steps activity did not occur: Safety/medical concerns      Pick up small objects from floor   Pick up small object from the floor assist level: Dependent - Patient 0%    Wheelchair Is the patient using a wheelchair?: Yes Type of Wheelchair: Manual   Wheelchair assist level: Set up assist Max wheelchair distance: 150f  Wheel 50 feet with 2 turns activity   Assist Level: Supervision/Verbal cueing  Wheel 150 feet activity   Assist Level: Supervision/Verbal cueing    Refer to Care Plan for Long Term Goals  SHORT TERM GOAL WEEK 1 PT Short Term Goal 1 (Week 1): Pt will perform supine<>sit with supervision PT Short Term Goal 2 (Week 1): Pt will perform bed<>chair transfers using LRAD simulating home set-up with CGA PT Short Term Goal 3 (Week 1): Pt will perform real-life car transfer with min assist PT Short Term Goal 4 (Week 1): Pt will demonstrate ability to set-up transfers managing w/c parts with only intermittent cuing  Recommendations for other services: Therapeutic Recreation  Stress management and Outing/community reintegration  Skilled Therapeutic Intervention Pt received sitting in w/c and agreeable to therapy session. Pt wearing R LE DARCO shoe throughout session. Evaluation completed (see details above) with patient education regarding purpose of PT evaluation, PT POC and goals, therapy schedule, weekly team meetings, and other CIR information including safety plan and fall  risk safety. Pt with bandage on L knee tibial tuberosity but states he is still wearing prosthetic despite this. Pt performed the below functional mobility tasks with the specified levels of skilled cuing and assistance. Pt able to use B UE support on // bars and L LE to maintain only minimal WBing through R heel wearing DARCO shoe in standing. Discussed primary concerns prior to D/C being pt's need for improved L LE fitting prosthetic for safe transfers, need for custom light-weight manual wheelchair, bed height at home may not allow squat pivot transfers requiring a stand pivot, and real-life car transfer which will likely require stand pivot transfers. At end of session, pt left seated in w/c with needs in reach and seat belt alarm on.  Mobility Bed Mobility Bed Mobility: Supine to Sit;Sit to Supine Supine to Sit: Supervision/Verbal cueing Sit to Supine: Minimal Assistance - Patient > 75% (requires assist for LE management onto bed) Transfers Transfers: Sit to Stand;Stand to Sit;Squat Pivot Transfers Sit to Stand: Minimal Assistance - Patient > 75% (in // bars) Stand to Sit: Minimal Assistance - Patient > 75% (in // bars) Squat Pivot Transfers: Supervision/Verbal cueing;Contact Guard/Touching assist Transfer (Assistive device): Other (Comment) (// bars) Locomotion  Gait Ambulation: No Gait Gait: No Stairs / Additional Locomotion Stairs: No WArchitect Yes Wheelchair Assistance: Set up aLexicographer Both upper extremities Wheelchair Parts Management: Needs assistance Distance: 1525f  Discharge Criteria: Patient will be discharged from PT if patient refuses treatment 3 consecutive times without medical reason, if treatment goals not met, if there is a change in medical status, if patient makes no progress towards goals or if patient is discharged from hospital.  The above assessment, treatment plan, treatment  alternatives and goals were  discussed and mutually agreed upon: by patient  Tawana Scale , PT, DPT, NCS, CSRS 03/09/2022, 8:00 AM

## 2022-03-09 NOTE — Evaluation (Signed)
Occupational Therapy Assessment and Plan  Patient Details  Name: TIMONTHY HOVATER MRN: [id number] Date of Birth: [birth year]  OT Diagnosis: muscle weakness (generalized) Rehab Potential: Rehab Potential (ACUTE ONLY): Excellent ELOS: 10-12 days   Today's Date: 03/09/2022 OT Individual Time: 4081-4481 OT Individual Time Calculation (min): 65 min     Hospital Problem: Principal Problem:   S/P transmetatarsal amputation of foot ([place])   Past Medical History:  Past Medical History:  Diagnosis Date   Coronary artery disease    Diabetes ([place])    DVT (deep venous thrombosis) ([place])    Gangrene of left foot ([place]) 02/09/2018   GERD (gastroesophageal reflux disease)    Hyperlipidemia    Hypertension    Peripheral vascular disease ([place])    Past Surgical History:  Past Surgical History:  Procedure Laterality Date   ABDOMINAL AORTOGRAM W/LOWER EXTREMITY N/A 02/20/2022   Procedure: ABDOMINAL AORTOGRAM W/LOWER EXTREMITY;  Surgeon: Marty Heck, MD;  Location: [facility];  Service: Cardiovascular;  Laterality: N/A;   AMPUTATION Left 10/28/2018   Procedure: AMPUTATION BELOW KNEE;  Surgeon: Algernon Huxley, MD;  Location: [facility];  Service: Vascular;  Laterality: Left;   AMPUTATION Left 09/10/2021   Procedure: AMPUTATION DIGIT;  Surgeon: Cindra Presume, MD;  [facility]: [facility];  Service: Plastics;  Laterality: Left;   AMPUTATION Left 09/14/2021   Procedure: AMPUTATION DIGIT left long finger and irrigation and debridement;  Surgeon: Cindra Presume, MD;  [facility];  Service: Plastics;  Laterality: Left;   AMPUTATION Right 02/27/2022   Procedure: RIGHT PARTIAL AMPUTATION FOOT;  Surgeon: Edrick Kins, DPM;  Location: [facility];  Service: Podiatry;  Laterality: Right;   AMPUTATION TOE Left 02/10/2018   Procedure: AMPUTATION TOE;  Surgeon: Sharlotte Alamo, DPM;  Location: [facility];  Service: Podiatry;  Laterality: Left;   APPLICATION OF WOUND VAC Left 10/16/2018   Procedure: APPLICATION OF WOUND  VAC;  Surgeon: Samara Deist, DPM;  Location: [facility];  Service: Podiatry;  Laterality: Left;   DORSAL SLIT N/A 01/12/2021   Procedure: DORSAL SLIT;  Surgeon: Billey Co, MD;  Location: [facility];  Service: Urology;  Laterality: N/A;   FEMORAL-TIBIAL BYPASS GRAFT Right 02/25/2022   Procedure: RIGHT LOWER EXTREMITY FEMORAL TO TIBIAL PERONEAL TRUNK BYPASS;  Surgeon: Marty Heck, MD;  Location: [facility];  Service: Vascular;  Laterality: Right;  INSERT ARTERIAL LINE   groin surgery     IRRIGATION AND DEBRIDEMENT FOOT Left 09/30/2018   Procedure: IRRIGATION AND DEBRIDEMENT FOOT;  Surgeon: Sharlotte Alamo, DPM;  Location: [facility];  Service: Podiatry;  Laterality: Left;   LOWER EXTREMITY ANGIOGRAPHY Left 02/12/2018   Procedure: Lower Extremity Angiography;  Surgeon: Algernon Huxley, MD;  Location: [facility];  Service: Cardiovascular;  Laterality: Left;   LOWER EXTREMITY ANGIOGRAPHY Left 10/01/2018   Procedure: Lower Extremity Angiography;  Surgeon: Algernon Huxley, MD;  Location: [facility];  Service: Cardiovascular;  Laterality: Left;   LOWER EXTREMITY ANGIOGRAPHY Left 10/19/2018   Procedure: Lower Extremity Angiography;  Surgeon: Algernon Huxley, MD;  Location: [facility];  Service: Cardiovascular;  Laterality: Left;   LOWER EXTREMITY ANGIOGRAPHY Left 10/20/2018   Procedure: LOWER EXTREMITY ANGIOGRAPHY;  Surgeon: Algernon Huxley, MD;  Location: [facility];  Service: Cardiovascular;  Laterality: Left;   LOWER EXTREMITY ANGIOGRAPHY Right 11/25/2018   Procedure: LOWER EXTREMITY ANGIOGRAPHY;  Surgeon: Algernon Huxley, MD;  Location: [facility];  Service: Cardiovascular;  Laterality: Right;   LOWER EXTREMITY ANGIOGRAPHY Right 01/10/2020   Procedure: LOWER EXTREMITY ANGIOGRAPHY;  Surgeon: Algernon Huxley, MD;  Location: [facility];  Service: Cardiovascular;  Laterality: Right;   LOWER EXTREMITY ANGIOGRAPHY Right 12/11/2020   Procedure: LOWER EXTREMITY ANGIOGRAPHY;   Surgeon: Algernon Huxley, MD;  Location: [facility];  Service: Cardiovascular;  Laterality: Right;   LOWER EXTREMITY INTERVENTION  02/12/2018   Procedure: LOWER EXTREMITY INTERVENTION;  Surgeon: Algernon Huxley, MD;  Location: [facility];  Service: Cardiovascular;;   MINOR IRRIGATION AND DEBRIDEMENT OF WOUND Left 09/10/2021   Procedure: IRRIGATION AND DEBRIDEMENT OF LEFT LONG FINGER WOUND;  Surgeon: Cindra Presume, MD;  [facility];  Service: Plastics;  Laterality: Left;   NECK SURGERY     PERIPHERAL VASCULAR INTERVENTION  02/20/2022   Procedure: PERIPHERAL VASCULAR INTERVENTION;  Surgeon: Marty Heck, MD;  Location: [facility];  Service: Cardiovascular;;  right iliac   TRANSMETATARSAL AMPUTATION Left 10/16/2018   Procedure: TRANSMETATARSAL AMPUTATION LEFT FOOT;  Surgeon: Samara Deist, DPM;  Location: [facility];  Service: Podiatry;  Laterality: Left;   VEIN HARVEST Right 02/25/2022   Procedure: VEIN HARVEST OF RIGHT GREATER SAPHENOUS VEIN;  Surgeon: Marty Heck, MD;  [facility];  Service: Vascular;  Laterality: Right;    Assessment & Plan Clinical Impression:   Waldo Laine with  history of T2DM poorly controlled,  CAD, DVT/CAF_ on Eliquis, PAD with severe atherosclerotic changes BLE with chronic ulcers RLE and s/p L-BKA who was admitted on 02/15/22 with recurrent falls, fevers, leucocytosis, confusion with question of left sided weakness and noted to be septic due to osteomyelitis of right 5th MT, bony destruction and diffuse edema. CTA head/neck was negative for LVO.  MRI brain negative for acute changes and showed moderately advanced chronic changes with small remote left cerebellar infarct.  He was started on broad spectrum antibiotics and podiatry/Dr.Stover as well as Dr. Carlis Abbott were consulted for input as patient not in favor of losing his leg.    He underwent aortogram with R-external iliac artery angioplasty and stent placement on 05/17  followed by right CFA to tibioperoneal trunk bypass with ipsilateral nonreversed great saphenous vein on 05/22 in attempts at limb salvage.  He underwent partial 5th ray amputation of right foot by Dr. Amalia Hailey on 05/24 and to be minimally WB on RLE for transfers only  ID following for input on antibiotic regimen--surgical wound culture positive for rare Group B strep agalactiae and rare Staph aureus. Antibiotics narrowed to Augmentin X 4 weeks with end date of 06/21 and ID follow up at discharge.  PT/OT has been working with patient who continues to be limited by weakness requiring STEDY for standing attempts/WB precautions. CIR recommended due to functional decline. Currently complains of weakness. Patient transferred to CIR on 03/08/2022 .    Patient currently requires mod with LB basic self-care skills secondary to muscle weakness and decreased sitting balance, decreased standing balance, and decreased balance strategies.  Prior to hospitalization, patient could complete BADLs with modified independent  at w/c level.  He does not do IADLs.  Patient will benefit from skilled intervention to increase independence with basic self-care skills prior to discharge home with care partner.  Anticipate patient will require intermittent supervision and follow up home health.  OT - End of Session Activity Tolerance: Tolerates 10 - 20 min activity with multiple rests Endurance Deficit: Yes Endurance Deficit Description: requires intermittent rest breaks OT  Assessment Rehab Potential (ACUTE ONLY): Excellent OT Patient demonstrates impairments in the following area(s): Balance;Endurance;Motor OT Basic ADL's Functional Problem(s): Bathing;Dressing;Toileting OT Transfers Functional Problem(s): Toilet OT Additional Impairment(s): None OT Plan OT Intensity: Minimum of 1-2 x/day, 45 to 90 minutes OT Frequency: 5 out of 7 days OT Duration/Estimated Length of Stay: 10-12 days OT Treatment/Interventions:  Balance/vestibular training;Discharge planning;DME/adaptive equipment instruction;Functional mobility training;Patient/family education;Self Care/advanced ADL retraining;Therapeutic Activities;Therapeutic Exercise OT Self Feeding Anticipated Outcome(s): ind OT Basic Self-Care Anticipated Outcome(s): mod I OT Toileting Anticipated Outcome(s): mod I OT Bathroom Transfers Anticipated Outcome(s): mod I to toilet OT Recommendation Patient destination: Home Follow Up Recommendations: Home health OT Equipment Recommended: None recommended by OT   OT Evaluation Precautions/Restrictions  Precautions Precautions: Fall;Other (comment) Precaution Comments: H/o L BKA (prosthetic in room) Other Brace: RLE darco shoe (in room) Restrictions Weight Bearing Restrictions: Yes RLE Weight Bearing: Partial weight bearing Other Position/Activity Restrictions: minimal WB through R heel for transfers only wearing DARCO shoe   Pain Pain Assessment Pain Scale: 0-10 Pain Score: 0-No pain Home Living/Prior Functioning Home Living Family/patient expects to be discharged to:: Private residence Living Arrangements: Spouse/significant other Available Help at Discharge: Friend(s), Available 24 hours/day, Family (friend, [name], he lives with is retired & can provide 24hr support) Type of Home: House Home Access: Stairs to enter, Ramped entrance (pt says they have portable ramp to place over stairs) Entrance Stairs-Number of Steps: 2 Entrance Stairs-Rails: None Home Layout: One level Bathroom Shower/Tub: Chiropodist: Handicapped height Additional Comments: lives with friend at her house; will also have assist from brother and sister at d/c (he has a mobile home but not staying there)  Lives With: Friend(s) (friend, Journalist, newspaper) Prior Function Level of Independence: Needs assistance with homemaking, Requires assistive device for independence, Independent with transfers, Independent with gait (mod-I  using manual wheelchair in the home and intermittently walking with RW)  Able to Take Stairs?: Yes (mod-I up the 2 STE house using RW) Driving: No (can drive but doesn't have a car) Vocation: Retired Surveyor, mining Baseline Vision/History: 1 Wears glasses (wears glasses all the time but are currently broken) Ability to See in Adequate Light: 1 Impaired Patient Visual Report: No change from baseline Vision Assessment?: No apparent visual deficits Perception  Perception: Within Functional Limits Praxis Praxis: Intact Cognition Cognition Overall Cognitive Status: Within Functional Limits for tasks assessed Arousal/Alertness: Awake/alert Orientation Level: Person;Place;Situation Person: Oriented Place: Oriented Situation: Oriented Memory: Appears intact  Attention: Focused;Sustained Focused Attention: Appears intact Sustained Attention: Appears intact Safety/Judgment: Appears intact Brief Interview for Mental Status (BIMS) Repetition of Three Words (First Attempt): 3 Temporal Orientation: Year: Correct Temporal Orientation: Month: Accurate within 5 days Temporal Orientation: Day: Correct Recall: "Sock": Yes, no cue required Recall: "Blue": Yes, no cue required Recall: "Bed": Yes, no cue required BIMS Summary Score: 15 Sensation Sensation Light Touch: Impaired Detail Peripheral sensation comments: limited access to R ankle/foot due to bandage but absent sensation in toes; feels light touch on screen to L LE Light Touch Impaired Details: Impaired RLE Hot/Cold: Not tested Proprioception: Not tested Stereognosis: Not tested Coordination Gross Motor Movements are Fluid and Coordinated: No Coordination and Movement Description: generalized weakness and deconditioning with R LE WBing restrictions impacting movements Motor  Motor Motor: Other (comment) Motor - Skilled Clinical Observations: generalized weakness and deconditioning with prior L LE BKA and new R LE WBing restrictions   Trunk/Postural Assessment  Cervical Assessment Cervical Assessment: Within Functional Limits Thoracic Assessment Thoracic Assessment: Exceptions to [facility] (thoracic rounding) Lumbar  Assessment Lumbar Assessment: Exceptions to [facility] (posterior pelvic tilt) Postural Control Postural Control: Deficits on evaluation  Balance Balance Balance Assessed: Yes Static Sitting Balance Static Sitting - Balance Support: Feet supported Static Sitting - Level of Assistance: 6: Modified independent (Device/Increase time) Dynamic Sitting Balance Dynamic Sitting - Balance Support: Feet supported Dynamic Sitting - Level of Assistance: 5: Stand by assistance Static Standing Balance Static Standing - Balance Support: During functional activity;Bilateral upper extremity supported (using // bars) Static Standing - Level of Assistance: 4: Min assist Dynamic Standing Balance Dynamic Standing - Balance Support: During functional activity;Bilateral upper extremity supported Dynamic Standing - Level of Assistance: 2: Max assist;3: Mod assist Extremity/Trunk Assessment RUE Assessment RUE Assessment: Within Functional Limits LUE Assessment LUE Assessment: Within Functional Limits General Strength Comments: no ring finger  Care Tool Care Tool Self Care Eating   Eating Assist Level: Independent    Oral Care    Oral Care Assist Level: Independent    Bathing   Body parts bathed by patient: Right arm;Left arm;Chest;Abdomen;Front perineal area;Buttocks;Right upper leg;Left upper leg;Face   Body parts n/a: Right lower leg;Left lower leg Assist Level: Supervision/Verbal cueing    Upper Body Dressing(including orthotics)   What is the patient wearing?: Pull over shirt   Assist Level: Independent    Lower Body Dressing (excluding footwear)   What is the patient wearing?: Pants;Underwear/pull up Assist for lower body dressing: Maximal Assistance - Patient 25 - 49%    Putting on/Taking off footwear   What  is the patient wearing?: Orthosis Assist for footwear: Maximal Assistance - Patient 25 - 49%       Care Tool Toileting Toileting activity   Assist for toileting: Maximal Assistance - Patient 25 - 49%     Care Tool Bed Mobility Roll left and right activity   Roll left and right assist level: Minimal Assistance - Patient > 75%    Sit to lying activity   Sit to lying assist level: Minimal Assistance - Patient > 75%    Lying to sitting on side of bed activity   Lying to sitting on side of bed assist level: the ability to move from lying on the back to sitting on the side of the bed with no back support.: Contact Guard/Touching assist     Care Tool Transfers Sit to stand transfer Sit to stand activity did not occur: Safety/medical concerns (requires // bars)      Chair/bed transfer   Chair/bed transfer assist level: Contact Guard/Touching assist (squat pivot  )     Toilet transfer   Assist Level: Supervision/Verbal cueing     Care Tool Cognition  Expression of Ideas and Wants Expression of Ideas and Wants: 4. Without difficulty (complex and basic) - expresses complex messages without difficulty and with speech that is clear and easy to understand  Understanding Verbal and Non-Verbal Content Understanding Verbal and Non-Verbal Content: 4. Understands (complex and basic) - clear comprehension without cues or repetitions   Memory/Recall Ability Memory/Recall Ability : Current season;Location of own room;Staff names and faces;That he or she is in a hospital/hospital unit   Refer to Care Plan for [place] 1 OT Short Term Goal 1 (Week 1): Pt will don pull up underwear with min A. OT Short Term Goal 2 (Week 1): Pt will don pants with min A. OT Short Term Goal 3 (Week 1): Pt will don DARCO shoe with min A. OT Short Term Goal 4 (Week 1): Pt  will toilet with min A.  Recommendations for other services: None    Skilled Therapeutic  Intervention ADL ADL Eating: Independent Grooming: Independent Upper Body Bathing: Independent Where Assessed-Upper Body Bathing: Sitting at sink Lower Body Bathing: Supervision/safety Where Assessed-Lower Body Bathing: Sitting at sink Upper Body Dressing: Independent Lower Body Dressing: Maximal assistance Toileting: Maximal assistance Where Assessed-Toileting: Glass blower/designer: Close supervision Toilet Transfer Method: Engineer, water: Bedside commode Mobility  Bed Mobility Bed Mobility: Supine to Sit;Sit to Supine Supine to Sit: Supervision/Verbal cueing Sit to Supine: Minimal Assistance - Patient > 75% (requires assist for LE management onto bed) Transfers Sit to Stand: Minimal Assistance - Patient > 75% (in // bars) Stand to Sit: Minimal Assistance - Patient > 75% (in // bars)  Pt seen for initial evaluation and ADL training.  Pt explained his PLOF and wanted to demonstrate how he usually does transfers at home which is with a low squat pivot.  Pt completed sq pivot bed to wc and then w/c to and from toilet with close S.   At the sink when pt was trying to pull up his pants he shifted his hips forward in wc seat so he was in a semi reclined position and I had to explain this was not a safe position as he could easily slide forward out of seat. Pt struggling with lateral leans and instead lifted up with a w/c pushup for therapist to pull pants over hips. Will need to find safer strategies for patient.   Reviewed OT POC and discussed pt's goals.  Pt resting in wc with all needs met.    Discharge Criteria: Patient will be discharged from OT if patient refuses treatment 3 consecutive times without medical reason, if treatment goals not met, if there is a change in medical status, if patient makes no progress towards goals or if patient is discharged from hospital.  The above assessment, treatment plan, treatment alternatives and goals were discussed and  mutually agreed upon: by patient  V[place] 03/09/2022, 1:03 PM

## 2022-03-10 LAB — GLUCOSE, CAPILLARY
Glucose-Capillary: 177 mg/dL — ABNORMAL HIGH (ref 70–99)
Glucose-Capillary: 193 mg/dL — ABNORMAL HIGH (ref 70–99)
Glucose-Capillary: 176 mg/dL — ABNORMAL HIGH (ref 70–99)
Glucose-Capillary: 209 mg/dL — ABNORMAL HIGH (ref 70–99)

## 2022-03-10 MED ORDER — INSULIN GLARGINE-YFGN 100 UNIT/ML ~~LOC~~ SOLN
32.0000 [IU] | Freq: Two times a day (BID) | SUBCUTANEOUS | Status: DC
  Administered 2022-03-10 – 2022-03-11 (×2): 32 [IU] via SUBCUTANEOUS
  Filled 2022-03-10 (×4): qty 0.32

## 2022-03-10 NOTE — Progress Notes (Signed)
PROGRESS NOTE   Subjective/Complaints: Has no new complaints this morning Provided list of foods for pain and diabetes.     ROS: +right foot pain, sleeping well at night   Objective:   No results found. Recent Labs    03/09/22 0525  WBC 10.2  HGB 12.4*  HCT 38.1*  PLT 341   Recent Labs    03/09/22 0525  NA 141  K 4.2  CL 105  CO2 27  GLUCOSE 218*  BUN 10  CREATININE 0.88  CALCIUM 9.3    Intake/Output Summary (Last 24 hours) at 03/10/2022 1838 Last data filed at 03/10/2022 1500 Gross per 24 hour  Intake 600 ml  Output 1475 ml  Net -875 ml        Physical Exam: Vital Signs Blood pressure 132/68, pulse 90, temperature 97.6 F (36.4 C), resp. rate 18, height 6\' 4"  (1.93 m), weight 98.4 kg, SpO2 98 %. Gen: no distress, normal appearing HEENT: oral mucosa pink and moist, NCAT Cardio: Reg rate Chest: normal effort, normal rate of breathing Abd: soft, non-distended Ext: no edema Psych: pleasant, normal affect Skin:    General: Skin is warm and dry.     Comments: Dry scabbed ulcer on left tibial tuberosity. Right foot with dry dressing. Old Left 2nd digit amputation site well healed.  Moving extremities well Neurological:     Mental Status: He is alert and oriented to person, place, and time.    Assessment/Plan: 1. Functional deficits which require 3+ hours per day of interdisciplinary therapy in a comprehensive inpatient rehab setting. Physiatrist is providing close team supervision and 24 hour management of active medical problems listed below. Physiatrist and rehab team continue to assess barriers to discharge/monitor patient progress toward functional and medical goals  Care Tool:  Bathing    Body parts bathed by patient: Right arm, Left arm, Chest, Abdomen, Front perineal area, Buttocks, Right upper leg, Left upper leg, Face     Body parts n/a: Right lower leg, Left lower leg   Bathing assist  Assist Level: Supervision/Verbal cueing     Upper Body Dressing/Undressing Upper body dressing   What is the patient wearing?: Pull over shirt    Upper body assist Assist Level: Independent    Lower Body Dressing/Undressing Lower body dressing      What is the patient wearing?: Pants, Underwear/pull up     Lower body assist Assist for lower body dressing: Maximal Assistance - Patient 25 - 49%     Toileting Toileting    Toileting assist Assist for toileting: Maximal Assistance - Patient 25 - 49% Assistive Device Comment: Urinal   Transfers Chair/bed transfer  Transfers assist     Chair/bed transfer assist level: Contact Guard/Touching assist (squat pivot  )     Locomotion Ambulation   Ambulation assist   Ambulation activity did not occur: Safety/medical concerns          Walk 10 feet activity   Assist  Walk 10 feet activity did not occur: Safety/medical concerns        Walk 50 feet activity   Assist Walk 50 feet with 2 turns activity did not occur: Safety/medical concerns  Walk 150 feet activity   Assist Walk 150 feet activity did not occur: Safety/medical concerns         Walk 10 feet on uneven surface  activity   Assist Walk 10 feet on uneven surfaces activity did not occur: Safety/medical concerns         Wheelchair     Assist Is the patient using a wheelchair?: Yes Type of Wheelchair: Manual    Wheelchair assist level: Set up assist Max wheelchair distance: 100    Wheelchair 50 feet with 2 turns activity    Assist        Assist Level: Supervision/Verbal cueing   Wheelchair 150 feet activity     Assist      Assist Level: Supervision/Verbal cueing   Blood pressure 132/68, pulse 90, temperature 97.6 F (36.4 C), resp. rate 18, height 6\' 4"  (1.93 m), weight 98.4 kg, SpO2 98 %.  Medical Problem List and Plan: 1. Functional deficits secondary to right 5th digit amputation              -patient may shower             -ELOS/Goals: 10-14 days S             Continue CIR 2.  H/o DVT: Continue eliquis             -antiplatelet therapy:  ASA/Plavix 3. Pain Management:  Oxycodone prn. Provided with pain relief journal 4. Mood: LCSW to follow for evaluation and support.              -antipsychotic agents: N/A 5. Neuropsych: This patient is capable of making decisions on his own behalf. 6. Skin/Wound Care:  Add protein supplement and vitamins to promote wound healing. 7. Fluids/Electrolytes/Nutrition: Monitor I/O. Check CMET in am             --Magnesium added to help with sleep, pain and BP 8. Osteomyelitis Right 5th toe s/p partial amputation: Augmentin with end date of 03/27/22  --follow up with ID after discharge.               --continue to monitor WBC--trending down.  9. CAD: Continue ASA, Plavix, Cozaar and Lipitor. M--monitor for symptoms with increase in activity.  10. T2DM: Hgb A1C- 11.8 (was 12.2 09/2021)--followed by PCP/nutritionist in [place]              --continue Novolog 10 units tid for meal coverage with SSI for elevated BS.   Increase Lantus to 32U BID             --reports compliance with insulin regimen/diet PTA.  11. 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  12.  A fib: Monitor HR/BP TID. Continue Eliquis.  13. Acute blood loss anemia: Recheck CBC in am.  14. Vitamin D deficiency: start ergocalciferol 50,000 U once per week for 7 weeks 15. Suboptimal magnesium level: start supplement today 16. Screening for B12 deficiency: B12 reviewed and is normal at 746 on 02/16/22  LOS: 2 days A FACE TO FACE EVALUATION WAS PERFORMED  Izora Ribas 03/10/2022, 6:38 PM

## 2022-03-11 DIAGNOSIS — E1169 Type 2 diabetes mellitus with other specified complication: Secondary | ICD-10-CM

## 2022-03-11 DIAGNOSIS — D62 Acute posthemorrhagic anemia: Secondary | ICD-10-CM

## 2022-03-11 DIAGNOSIS — I4891 Unspecified atrial fibrillation: Secondary | ICD-10-CM

## 2022-03-11 LAB — BASIC METABOLIC PANEL
Anion gap: 6 (ref 5–15)
BUN: 13 mg/dL (ref 8–23)
CO2: 25 mmol/L (ref 22–32)
Calcium: 9 mg/dL (ref 8.9–10.3)
Chloride: 106 mmol/L (ref 98–111)
Creatinine, Ser: 0.89 mg/dL (ref 0.61–1.24)
GFR, Estimated: >60 mL/min (ref >60)
Glucose, Bld: 219 mg/dL — ABNORMAL HIGH (ref 70–99)
Potassium: 4.2 mmol/L (ref 3.5–5.1)
Sodium: 137 mmol/L (ref 135–145)

## 2022-03-11 LAB — GLUCOSE, CAPILLARY
Glucose-Capillary: 229 mg/dL — ABNORMAL HIGH (ref 70–99)
Glucose-Capillary: 233 mg/dL — ABNORMAL HIGH (ref 70–99)
Glucose-Capillary: 196 mg/dL — ABNORMAL HIGH (ref 70–99)
Glucose-Capillary: 153 mg/dL — ABNORMAL HIGH (ref 70–99)

## 2022-03-11 LAB — CBC
HCT: 40.2 % (ref 39.0–52.0)
Hemoglobin: 13.5 g/dL (ref 13.0–17.0)
MCH: 27.7 pg (ref 26.0–34.0)
MCHC: 33.6 g/dL (ref 30.0–36.0)
MCV: 82.5 fL (ref 80.0–100.0)
Platelets: 302 10*3/uL (ref 150–400)
RBC: 4.87 MIL/uL (ref 4.22–5.81)
RDW: 14.7 % (ref 11.5–15.5)
WBC: 10.2 10*3/uL (ref 4.0–10.5)
nRBC: 0 % (ref 0.0–0.2)

## 2022-03-11 MED ORDER — INSULIN GLARGINE-YFGN 100 UNIT/ML ~~LOC~~ SOLN
34.0000 [IU] | Freq: Two times a day (BID) | SUBCUTANEOUS | Status: DC
Start: 2022-03-11 — End: 2022-03-11

## 2022-03-11 MED ORDER — INSULIN GLARGINE-YFGN 100 UNIT/ML ~~LOC~~ SOLN
34.0000 [IU] | Freq: Two times a day (BID) | SUBCUTANEOUS | Status: DC
Start: 2022-03-11 — End: 2022-03-12
  Administered 2022-03-11 – 2022-03-12 (×2): 34 [IU] via SUBCUTANEOUS
  Filled 2022-03-11 (×3): qty 0.34

## 2022-03-11 MED ORDER — INSULIN GLARGINE-YFGN 100 UNIT/ML ~~LOC~~ SOLN
33.0000 [IU] | Freq: Two times a day (BID) | SUBCUTANEOUS | Status: DC
Start: 2022-03-11 — End: 2022-03-11

## 2022-03-11 NOTE — Progress Notes (Signed)
Inpatient Rehabilitation  Patient information reviewed and entered into eRehab system by Itzel Lowrimore Samie Reasons, OTR/L.   Information including medical coding, functional ability and quality indicators will be reviewed and updated through discharge.    

## 2022-03-11 NOTE — IPOC Note (Signed)
Overall Plan of Care [facility]) Patient Details Name: Austin [name] MRN: [id number] DOB: 05/02/1956  Admitting Diagnosis: S/P transmetatarsal amputation of foot [facility])  Hospital Problems: Principal Problem:   S/P transmetatarsal amputation of foot (HCC)     Functional Problem List: Nursing Edema, Endurance, Medication Management, Motor, Safety, Skin Integrity  PT Balance, Safety, Sensory, Skin Integrity, Endurance, Motor, Nutrition, Pain, Edema  OT Balance, Endurance, Motor  SLP    TR         Basic ADL's: OT Bathing, Dressing, Toileting     Advanced  ADL's: OT       Transfers: PT Bed Mobility, Bed to Chair, Car, Oncologist: PT Ambulation, Psychologist, prison and probation services, Stairs     Additional Impairments: OT None  SLP        TR      Anticipated Outcomes Item Anticipated Outcome  Self Feeding ind  Swallowing      Basic self-care  mod I  Toileting  mod I   Bathroom Transfers mod I to toilet  Bowel/Bladder  n/a  Transfers  supervision  Locomotion  due to [employer] restrictions and 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 pt not safe to ambulate  Communication     Cognition     Pain  n/a  Safety/Judgment  min assist   Therapy Plan: PT Intensity: Minimum of 1-2 x/day ,45 to 90 minutes PT Frequency: 5 out of 7 days PT Duration Estimated Length of Stay: 10-12 days OT Intensity: Minimum of 1-2 x/day, 45 to 90 minutes OT Frequency: 5 out of 7 days OT Duration/Estimated Length of Stay: 10-12 days     Team Interventions: Nursing Interventions Patient/Family Education, Disease Management/Prevention, Medication Management, Skin Care/Wound Management, Discharge Planning  PT interventions Ambulation/gait training, Community reintegration, DME/adaptive equipment instruction, Neuromuscular re-education, Psychosocial support, Stair training, UE/LE Strength taining/ROM, Wheelchair propulsion/positioning, Warden/ranger, Discharge  planning, Functional electrical stimulation, Pain management, Skin care/wound management, Therapeutic Activities, UE/LE Coordination activities, Disease management/prevention, Functional mobility training, Patient/family education, Splinting/orthotics, Therapeutic Exercise, Visual/perceptual remediation/compensation  OT Interventions Balance/vestibular training, Discharge planning, DME/adaptive equipment instruction, Functional mobility training, Patient/family education, Self Care/advanced ADL retraining, Therapeutic Activities, Therapeutic Exercise  SLP Interventions    TR Interventions    SW/CM Interventions Discharge Planning, Psychosocial Support, Patient/Family Education   Barriers to Discharge MD  Medical stability  Nursing Decreased caregiver support, Home environment access/layout, Wound Care, Lack of/limited family support, Weight bearing restrictions 1 level, level entry. Lives with S/O. She and other family can provide min assist with mobility.  PT Weight bearing restrictions    OT      SLP      SW       Team Discharge Planning: Destination: PT-Home ,OT- Home , SLP-  Projected Follow-up: PT-Home health PT, 24 hour supervision/assistance, OT-  Home health OT, SLP-  Projected Equipment Needs: PT-To be determined, OT- None recommended by OT, SLP-  Equipment Details: PT- , OT-  Patient/family involved in discharge planning: PT- Patient,  OT-Patient, SLP-   MD ELOS: 10-14 days Medical Rehab Prognosis:  Excellent Assessment: The patient has been admitted for CIR therapies with the diagnosis of right 5th digit amputation.The team will be addressing functional mobility, strength, stamina, balance, safety, adaptive techniques and equipment, self-care, bowel and bladder mgt, patient and caregiver education. Goals have been set at supervision. Anticipated discharge destination is home.       See Team Conference Notes for weekly updates to the plan of  care

## 2022-03-11 NOTE — Progress Notes (Addendum)
PROGRESS NOTE   Subjective/Complaints: No new complaints or concerns this AM. Eating his breakfast.     ROS: +right foot pain, sleeping well at night. No CP or SOB.    Objective:   No results found. Recent Labs    03/09/22 0525 03/11/22 0551  WBC 10.2 10.2  HGB 12.4* 13.5  HCT 38.1* 40.2  PLT 341 302   Recent Labs    03/09/22 0525 03/11/22 0551  NA 141 137  K 4.2 4.2  CL 105 106  CO2 27 25  GLUCOSE 218* 219*  BUN 10 13  CREATININE 0.88 0.89  CALCIUM 9.3 9.0    Intake/Output Summary (Last 24 hours) at 03/11/2022 0904 Last data filed at 03/11/2022 0138 Gross per 24 hour  Intake 1000 ml  Output 1500 ml  Net -500 ml         Physical Exam: Vital Signs Blood pressure (!) 125/56, pulse 83, temperature 98 F (36.7 C), resp. rate 18, height 6\' 4"  (1.93 m), weight 98.4 kg, SpO2 100 %. Gen: no distress, normal appearing HEENT: oral mucosa pink and moist, NCAT Cardio: Reg rate, Chest: CTAB Abd: soft, non-distended Ext: no edema Psych: pleasant, normal affect Skin:    General: Skin is warm and dry.     Comments: Dry scabbed ulcer on left tibial tuberosity. Right foot with dry dressing. Old Left 2nd digit amputation site well healed.  Moving extremities well Neurological:     Mental Status: He is alert and oriented to person, place, and time.    Assessment/Plan: 1. Functional deficits which require 3+ hours per day of interdisciplinary therapy in a comprehensive inpatient rehab setting. Physiatrist is providing close team supervision and 24 hour management of active medical problems listed below. Physiatrist and rehab team continue to assess barriers to discharge/monitor patient progress toward functional and medical goals  Care Tool:  Bathing    Body parts bathed by patient: Right arm, Left arm, Chest, Abdomen, Front perineal area, Right upper leg, Left upper leg     Body parts n/a: Right lower leg,  Left lower leg   Bathing assist Assist Level: Supervision/Verbal cueing     Upper Body Dressing/Undressing Upper body dressing   What is the patient wearing?: Pull over shirt    Upper body assist Assist Level: Minimal Assistance - Patient > 75%    Lower Body Dressing/Undressing Lower body dressing      What is the patient wearing?: Incontinence brief, Pants     Lower body assist Assist for lower body dressing: Maximal Assistance - Patient 25 - 49%     Toileting Toileting    Toileting assist Assist for toileting: Moderate Assistance - Patient 50 - 74% [facility]) Assistive Device Comment: urinal and bedpan.   Transfers Chair/bed transfer  Transfers assist     Chair/bed transfer assist level: Contact Guard/Touching assist (squat pivot  )     Locomotion Ambulation   Ambulation assist   Ambulation activity did not occur: Safety/medical concerns          Walk 10 feet activity   Assist  Walk 10 feet activity did not occur: Safety/medical concerns        Walk  50 feet activity   Assist Walk 50 feet with 2 turns activity did not occur: Safety/medical concerns         Walk 150 feet activity   Assist Walk 150 feet activity did not occur: Safety/medical concerns         Walk 10 feet on uneven surface  activity   Assist Walk 10 feet on uneven surfaces activity did not occur: Safety/medical concerns         Wheelchair     Assist Is the patient using a wheelchair?: Yes Type of Wheelchair: Manual    Wheelchair assist level: Set up assist Max wheelchair distance: 100    Wheelchair 50 feet with 2 turns activity    Assist        Assist Level: Supervision/Verbal cueing   Wheelchair 150 feet activity     Assist      Assist Level: Supervision/Verbal cueing   Blood pressure (!) 125/56, pulse 83, temperature 98 F (36.7 C), resp. rate 18, height 6\' 4"  (1.93 m), weight 98.4 kg, SpO2 100 %.  Medical Problem List and Plan: 1.  Functional deficits secondary to right 5th digit amputation             -patient may shower             -ELOS/Goals: 10-14 days S             Continue CIR, continue PT and OT 2.  H/o DVT: Continue eliquis             -antiplatelet therapy:  ASA/Plavix 3. Pain Management:  Oxycodone prn. Provided with pain relief journal 4. Mood: LCSW to follow for evaluation and support.              -antipsychotic agents: N/A 5. Neuropsych: This patient is capable of making decisions on his own behalf. 6. Skin/Wound Care:  Add protein supplement and vitamins to promote wound healing. 7. Fluids/Electrolytes/Nutrition: Monitor I/O. Check CMET in am             --Magnesium added to help with sleep, pain and BP 8. Osteomyelitis Right 5th toe s/p partial amputation: Augmentin with end date of 03/27/22  --follow up with ID after discharge.               --continue to monitor WBC--trending down.   -6/5 WBC stable at 10.2 9. CAD: Continue ASA, Plavix, Cozaar and Lipitor. M--monitor for symptoms with increase in activity.  10. T2DM: Hgb A1C- 11.8 (was 12.2 09/2021)--followed by PCP/nutritionist in [place]              --continue Novolog 10 units tid for meal coverage with SSI for elevated BS.   Increase Lantus to 32U BID             --reports compliance with insulin regimen/diet PTA.   -6/5 Increase samglee to 34u BID 11. 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  12.  A fib: Monitor HR/BP TID. Continue Eliquis.  -stable heart rate, continue to follow 13. Acute blood loss anemia: Recheck CBC in am.  -6/5 HGB 13.5, follow on routine labs 14. Vitamin D deficiency: start ergocalciferol 50,000 U once per week for 7 weeks 15. Suboptimal magnesium level: start supplement today 16. Screening for B12 deficiency: B12 reviewed and is normal at 746 on 02/16/22  LOS: 3 days A FACE TO FACE EVALUATION WAS PERFORMED  Jennye Boroughs 03/11/2022,  9:04 AM

## 2022-03-11 NOTE — Progress Notes (Signed)
Physical Therapy Session Note  Patient Details  Name: Austin [name] MRN: [id number] Date of Birth: October 08, 1955  Today's Date: 03/11/2022 PT Individual Time: 6283-1517 and 1400-1456 PT Individual Time Calculation (min): 70 min and 56 min  Short Term Goals: Week 1:  PT Short Term Goal 1 (Week 1): Pt will perform supine<>sit with supervision PT Short Term Goal 2 (Week 1): Pt will perform bed<>chair transfers using LRAD simulating home set-up with CGA PT Short Term Goal 3 (Week 1): Pt will perform real-life car transfer with min assist PT Short Term Goal 4 (Week 1): Pt will demonstrate ability to set-up transfers managing w/c parts with only intermittent cuing  Skilled Therapeutic Interventions/Progress Updates:   Treatment Session 1 Received pt sitting in recliner with R Darco shoe donned. Pt agreeable to PT treatment and denied any pain during session. Session with emphasis on functional mobility/transfers, generalized strengthening and endurance, and standing balance. Donned gel liner and prosthetic with max A and noted pt's WC from home in room. Upon inspecting WC, found with brakes that are too loose and armrest detached from [facility] - notified CSW to determine if pt was eligible for new WC. Pt transferred recliner<>WC squat<>pivot to R with min/mod A and cues for technique. Pt performed WC mobility 133ft using BUE and supervision to ortho gym. Noticed open wound on R posterior calf - RN notified and cleaned/dressed wound. Pt performed BUE strengthening on UBE alternating 1 minute forward and 1 minute backwards for a total of 6 minutes on level 3 increasing to level 5 with emphasis on UE strength/endurance. Pt reported urge to void and transported back to room dependently. Pt able to void in urinal with supervision/set up assist and increased time, then performed WC mobility additional 152ft using BUE and supervision to main therapy gym. Worked on blocked practice sit<>stands inside // bars x 5 reps  with max A fading to mod A (both pulling up with BUE support on bars and placing 1 UE on WC armrest and 1 UE on parallel bars - does better with this technique). Noted severe tightness in R hamstrings with inability to place R heel on floor and extend R knee. Therefore performed seated passive hamstring stretch 4x20 second hold. Pt reported increased pain at L tibial tuberosity (old wound) with prolonged weightbearing, therefore limited amount of time spent in standing. Pt would benefit from consult with [employer] to look at prosthetic prior to discharge. Pt performed WC mobility 81ft using BUE and supervision back towards room - transported remainder of way dependently due to fatigue and transferred WC<>recliner stand/squat<>pivot with mod A (as pt getting stuck halfway through on recliner armrest). Concluded session with pt sitting in recliner, needs within reach, and seatbelt alarm on. NT present at bedside checking blood glucose levels.   Treatment Session 2 Received pt sitting in recliner with R Darco shoe donned and family present at bedside. Contacted [employer] to come out this afternoon to inspect prosthetic. Pt agreeable to PT treatment and denied any pain during session. Session with emphasis on functional mobility/transfers, generalized strengthening and endurance, and WC mobility. Donned gel liner with max A and prosthetic with supervision and transferred recliner<>WC squat<>pivot with mod A to clear buttocks as pt continues to get stuck on recliner armrest. Pt performed WC mobility >336ft using BUE and supervision with increased time to dayroom. Pt performed seated BLE strengthening on Kinetron at 20 cm/sec for 1 minute x 4 trials with emphasis on glute/quad strength as well as core control. Pt  then performed the following exercises with emphasis on UE strength/endurance: -WC pushups 2x10 -bicep curls with 12lb dowel 2x12 -horizontal chest press with 5lb dowel 2x12 -overhead chest press with 5lb dowel  2x12 -seated trunk rotation with 2.2lb medicine ball 2x10 bilaterally  Pt performed WC mobility additional 168ft using BUE and supervision back towards room - limited by fatigue and transported remainder of way back to room. Concluded session with pt sitting in WC, needs within reach, and seatbelt alarm on.   Therapy Documentation Precautions:  Precautions Precautions: Fall, Other (comment) Precaution Comments: H/o L BKA (prosthetic in room) Other Brace: RLE darco shoe (in room) Restrictions Weight Bearing Restrictions: Yes RLE Weight Bearing: Partial weight bearing Other Position/Activity Restrictions: minimal WB through R heel for transfers only wearing DARCO shoe  Therapy/Group: Individual Therapy Martin Majestic PT, DPT  03/11/2022, 7:08 AM

## 2022-03-11 NOTE — Progress Notes (Signed)
Occupational Therapy Session Note  Patient Details  Name: Austin [name] MRN: [id number] Date of Birth: 02-16-1956  Today's Date: 03/11/2022 OT Individual Time: 0800-0300 OT Individual Time Calculation (min): 55 min    Short Term Goals: Week 1:  OT Short Term Goal 1 (Week 1): Pt will don pull up underwear with min A. OT Short Term Goal 2 (Week 1): Pt will don pants with min A. OT Short Term Goal 3 (Week 1): Pt will don DARCO shoe with min A. OT Short Term Goal 4 (Week 1): Pt will toilet with min A.  Skilled Therapeutic Interventions/Progress Updates:  Skilled OT intervention completed with focus on ADL retraining, functional transfers. Pt received upright in bed, c/o 8/10 pain in R foot, pre-medicated, offered rest breaks and repositioning for pain reduction throughout session. Nurse in room to provide insulin. Completed bed mobility with min A for RLE and trunk control, total A for DRACO shoe on R foot, then min A lateral scoot/squat pivot > L to w/c. Seated at sink, pt completed UB bathing and grooming with supervision. Requested urinal use for void, with pt able to complete at set up A. Washed upper legs with supervision, then request to have BM on toilet. Pt does need assist for BLE management without leg rests donned for w/c mobility as pt having difficulty with active knee extension, relying on hands to pick up legs. Min A needed for donning prosthetic on LLE, then series of squat pivots > R to drop arm BSC over toilet with min A with pt using bilateral grab bars to push up. Continent of BM. Completed posterior pericare with CGA during forward lean. Series of squat pivots > L with min A, with pt requiring safety cues. Donned pants with max A at the sit > stand level with mod-max for standing from w/c using RW. Donned shirt with min A. Completed sit > stand with mod A and then stand pivot to recliner using RW with heavy mod A, with poor adherence to WB through R heel only with safety cues  needed as pt with heavy anterior lean in stance and poor positioning of RW far out in front of him. Pt was left seated in recliner, with belt alarm on, BLE elevated with pillows for comfort, and all needs in reach at end of session.   Therapy Documentation Precautions:  Precautions Precautions: Fall, Other (comment) Precaution Comments: H/o L BKA (prosthetic in room) Other Brace: RLE darco shoe (in room) Restrictions Weight Bearing Restrictions: Yes RLE Weight Bearing: Partial weight bearing Other Position/Activity Restrictions: minimal WB through R heel for transfers only wearing DARCO shoe    Therapy/Group: Individual Therapy  Austin [name] 03/11/2022, 7:30 AM

## 2022-03-12 LAB — GLUCOSE, CAPILLARY
Glucose-Capillary: 193 mg/dL — ABNORMAL HIGH (ref 70–99)
Glucose-Capillary: 136 mg/dL — ABNORMAL HIGH (ref 70–99)
Glucose-Capillary: 248 mg/dL — ABNORMAL HIGH (ref 70–99)
Glucose-Capillary: 163 mg/dL — ABNORMAL HIGH (ref 70–99)

## 2022-03-12 MED ORDER — INSULIN GLARGINE-YFGN 100 UNIT/ML ~~LOC~~ SOLN
35.0000 [IU] | Freq: Two times a day (BID) | SUBCUTANEOUS | Status: DC
  Administered 2022-03-12 – 2022-03-15 (×6): 35 [IU] via SUBCUTANEOUS
  Filled 2022-03-12 (×7): qty 0.35

## 2022-03-12 NOTE — Progress Notes (Signed)
Physical Therapy Session Note  Patient Details  Name: Austin [name] MRN: [id number] Date of Birth: December 11, 1955  Today's Date: 03/12/2022 PT Individual Time: 7989-2119 and 1301-1356 PT Individual Time Calculation (min): 41 min and 55 min  Short Term Goals: Week 1:  PT Short Term Goal 1 (Week 1): Pt will perform supine<>sit with supervision PT Short Term Goal 2 (Week 1): Pt will perform bed<>chair transfers using LRAD simulating home set-up with CGA PT Short Term Goal 3 (Week 1): Pt will perform real-life car transfer with min assist PT Short Term Goal 4 (Week 1): Pt will demonstrate ability to set-up transfers managing w/c parts with only intermittent cuing  Skilled Therapeutic Interventions/Progress Updates:   Treatment Session 1 Received pt sitting in WC with R Darco shoe donned, pt agreeable to PT treatment, and reported pain 6/10 along wound on L tibial tuberosity (premedicated). OT requested to focus on slideboard transfers this session due to high pain levels and nursing difficulty with transfers last night. Session with emphasis on functional mobility/transfers, generalized strengthening and endurance, and WC mobility. Donned gel liner and L prosthetic with supervision and min cues and pt performed WC mobility 160ft using BUE and supervision to ortho gym. Educated pt on use of slideboard and pt reported using one before. Pt transferred on/off mat via slideboard and CGA with min cues for hand placement. Worked on the following activities sitting EOM with supervision and verbal cues for technique: -batting beach ball with 4lb dowel x20 and 5lb dowel x20 with emphasis on UE strength and core control. Noted mild posterior lean but able to self-correct with cues -seated trunk rotation with 6lb medicine ball 2x8 bilaterally  Attempted seated LAQ on RLE but noted decreased ROM due to severely tight R hamstring. Performed supine passive sustained hamstring stretch for 3 minutes with RLE supported  on WC. Pt unable to tolerate position sitting upright and requested to perform lying down. Pt transported back to room dependently, removed prosthetic, and placed amputee support pad on L side. Propped RLE on trashcan as pt requesting to stretch R hamstring more but unable to tolerate placing it on standard chair. Concluded session with pt sitting in WC, needs within reach, and seatbelt alarm on. Safety plan updated.   Treatment Session 2 Received pt sitting in WC with R Darco shoe donned, pt agreeable to PT treatment, and reported pain 6/10 along wound at L tibial tuberosity - RN notified and administered pain medication. Contacted Scott from [place] who reported Brett Canales will be out today from 2-4 to inspect prosthetic. Session with emphasis on functional mobility/transfers, generalized strengthening and endurance, and WC mobility. Donned gel liner and L prosthetic with supervision and min cues. Pt performed WC mobility >3109ft using BUE and supervision with increased time to dayroom. Pt transferred WC<>Nustep squat<>pivot to R with min A and performed seated BUE/LE strengthening on Nustep at workload 6 for 10 minutes for a total of 523 steps with emphasis on cardiovascular endurance. Transferred back into WC squat<>pivot to R with min A and then again WC<>mat to R with min A and sit<>semi-reclined on wedge with CGA. Performed passive R hamstring stretch against gravity with towels under R heel to further facilitate stretch for 5 minutes. During passive stretch performed the following exercises with supervision and verbal cues for technique: -chest press with 12lb dowel 3x12 -LLE SLR 2x8 -LLE hip abduction 2x8 Pt transferred semi-reclined<>sitting EOM with supervision with increased effort/time and transferred mat<>WC to L squat<>pivot with min A. Pt transported back  to room in [facility] dependently and concluded session with pt sitting in WC, needs within reach, and seatbelt alarm on. Removed prosthetic and donned  amputee support pad for pressure relief on L tibial tuberosity wound and propped RLE on trashcan for comfort and to stretch R hamstring.   Therapy Documentation Precautions:  Precautions Precautions: Fall, Other (comment) Precaution Comments: H/o L BKA (prosthetic in room) Other Brace: RLE darco shoe (in room) Restrictions Weight Bearing Restrictions: Yes RLE Weight Bearing: Partial weight bearing Other Position/Activity Restrictions: minimal WB through R heel for transfers only wearing DARCO shoe  Therapy/Group: Individual Therapy Martin Majestic PT, DPT  03/12/2022, 7:05 AM

## 2022-03-12 NOTE — Progress Notes (Addendum)
PROGRESS NOTE   Subjective/Complaints: No new complaints this morning, sleepy    ROS: +right foot pain, sleeping well at night, denies constipation.    Objective:   No results found. Recent Labs    03/11/22 0551  WBC 10.2  HGB 13.5  HCT 40.2  PLT 302   Recent Labs    03/11/22 0551  NA 137  K 4.2  CL 106  CO2 25  GLUCOSE 219*  BUN 13  CREATININE 0.89  CALCIUM 9.0    Intake/Output Summary (Last 24 hours) at 03/12/2022 [id number] Last data filed at 03/12/2022 [id number] Gross per 24 hour  Intake 596 ml  Output 925 ml  Net -329 ml     Pressure Injury 03/08/22 Pretibial Left;Proximal Unstageable - Full thickness tissue loss in which the base of the injury is covered by slough (yellow, tan, gray, green or brown) and/or eschar (tan, brown or black) in the wound bed. Unstageable caused by (Active)  03/08/22 1515  Location: Pretibial  Location Orientation: Left;Proximal  Staging: Unstageable - Full thickness tissue loss in which the base of the injury is covered by slough (yellow, tan, gray, green or brown) and/or eschar (tan, brown or black) in the wound bed.  Wound Description (Comments): Unstageable caused by prothesis.  Present on Admission: Yes    Physical Exam: Vital Signs Blood pressure 130/74, pulse 64, temperature 97.6 F (36.4 C), resp. rate 18, height 6\' 4"  (1.93 m), weight 98.4 kg, SpO2 100 %. Gen: no distress, normal appearing, BMI 26.41 HEENT: oral mucosa pink and moist, NCAT Cardio: Reg rate Chest: normal effort, normal rate of breathing Abd: soft, non-distended Ext: no edema Psych: pleasant, normal affect Skin:    General: Skin is warm and dry.     Comments: Dry scabbed ulcer on left tibial tuberosity. Right foot with dry dressing. Old Left 2nd digit amputation site well healed.  Moving extremities well Neurological:     Mental Status: He is alert and oriented to person, place, and time.     Assessment/Plan: 1. Functional deficits which require 3+ hours per day of interdisciplinary therapy in a comprehensive inpatient rehab setting. Physiatrist is providing close team supervision and 24 hour management of active medical problems listed below. Physiatrist and rehab team continue to assess barriers to discharge/monitor patient progress toward functional and medical goals  Care Tool:  Bathing    Body parts bathed by patient: Right arm, Left arm, Chest, Abdomen, Front perineal area, Right upper leg, Left upper leg     Body parts n/a: Right lower leg, Left lower leg   Bathing assist Assist Level: Supervision/Verbal cueing     Upper Body Dressing/Undressing Upper body dressing   What is the patient wearing?: Pull over shirt    Upper body assist Assist Level: Minimal Assistance - Patient > 75%    Lower Body Dressing/Undressing Lower body dressing      What is the patient wearing?: Incontinence brief, Pants     Lower body assist Assist for lower body dressing: Maximal Assistance - Patient 25 - 49%     Toileting Toileting    Toileting assist Assist for toileting: Moderate Assistance - Patient 50 - 74% (BSC)  Assistive Device Comment: urinal and bedpan.   Transfers Chair/bed transfer  Transfers assist     Chair/bed transfer assist level: Moderate Assistance - Patient 50 - 74% (squat<>pivot)     Locomotion Ambulation   Ambulation assist   Ambulation activity did not occur: Safety/medical concerns          Walk 10 feet activity   Assist  Walk 10 feet activity did not occur: Safety/medical concerns        Walk 50 feet activity   Assist Walk 50 feet with 2 turns activity did not occur: Safety/medical concerns         Walk 150 feet activity   Assist Walk 150 feet activity did not occur: Safety/medical concerns         Walk 10 feet on uneven surface  activity   Assist Walk 10 feet on uneven surfaces activity did not occur:  Safety/medical concerns         Wheelchair     Assist Is the patient using a wheelchair?: Yes Type of Wheelchair: Manual    Wheelchair assist level: Supervision/Verbal cueing Max wheelchair distance: 161ft    Wheelchair 50 feet with 2 turns activity    Assist        Assist Level: Supervision/Verbal cueing   Wheelchair 150 feet activity     Assist      Assist Level: Supervision/Verbal cueing   Blood pressure 130/74, pulse 64, temperature 97.6 F (36.4 C), resp. rate 18, height 6\' 4"  (1.93 m), weight 98.4 kg, SpO2 100 %.  Medical Problem List and Plan: 1. 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  H/o DVT: Continue eliquis             -antiplatelet therapy:  ASA/Plavix 3. Pain Management:  continue Oxycodone prn. Provided with pain relief journal 4. Mood: LCSW to follow for evaluation and support.              -antipsychotic agents: N/A 5. Neuropsych: This patient is capable of making decisions on his own behalf. 6. Skin/Wound Care:  Add protein supplement and vitamins to promote wound healing. 7. Fluids/Electrolytes/Nutrition: Monitor I/O. Check CMET in am             --Magnesium added to help with sleep, pain and BP 8. Osteomyelitis Right 5th toe s/p partial amputation: Augmentin with end date of 03/27/22  --follow up with ID after discharge.               --continue to monitor WBC--trending down.  9. CAD: Continue ASA, Plavix, Cozaar and Lipitor. M--monitor for symptoms with increase in activity.  10. T2DM: Hgb A1C- 11.8 (was 12.2 09/2021)--followed by PCP/nutritionist in [place]              --continue Novolog 10 units tid for meal coverage with SSI for elevated BS.   Increase Lantus to 33U BID             --reports compliance with insulin regimen/diet PTA.  11. 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  12.  A fib: Monitor HR/BP TID. Continue Eliquis.  13. Acute blood loss anemia: Recheck CBC in  am.  14. Vitamin D deficiency: start ergocalciferol 50,000 U once per week for 7 weeks 15. Suboptimal magnesium level: start supplement today 16. Screening for B12 deficiency: B12 reviewed and is normal at 746 on 02/16/22  LOS: 4 days A FACE TO FACE EVALUATION WAS PERFORMED  Clide Deutscher Carlyann Placide 03/12/2022, 8:32 AM

## 2022-03-12 NOTE — Progress Notes (Signed)
Occupational Therapy Session Note  Patient Details  Name: Austin [name] MRN: [id number] Date of Birth: [birth year]  Today's Date: 03/12/2022 OT Individual Time: 2951-8841 OT Individual Time Calculation (min): 57 min    Short Term Goals: Week 1:  OT Short Term Goal 1 (Week 1): Pt will don pull up underwear with min A. OT Short Term Goal 2 (Week 1): Pt will don pants with min A. OT Short Term Goal 3 (Week 1): Pt will don DARCO shoe with min A. OT Short Term Goal 4 (Week 1): Pt will toilet with min A.  Skilled Therapeutic Interventions/Progress Updates:  Skilled OT intervention completed with focus on functional transfers, ADL retraining. Pt received semi-supine in bed, no c/o pain, agreeable to session. Pt's bed found to be saturated, with no brief donned, with pt expressing he prefers the "sucking thing that get's the pee" and therapist educating pt on non-functional use of purewicks and how he can ask to use toilet in bathroom vs urinal 2/2 spillage issues. At bed level, therapist assisted pt with removal of bed sheets and donning of brief/pants for maximizing pts's access to LB. Mod A needed for donning of brief/pants with mod A needed to roll > L. Bed mobility completed with mod A via HHA for trunk control, then lateral scoots > L with initial CGA however min A needed for hip clearance over w/c cushion. Seated at sink, pt completed UB bathing, dressing and grooming with supervision. Donned prosthesis with mod A, with pt expressing 5/10 pain in tibial tuberosity where a sore has developed from friction/poor fit of the prosthesis- notified nursing and meds provided during session. Deferred to seated tasks vs standing this session until proper fit of orthotic can be provided to minimize pain and further irritation. Self-propelled in w/c > gym with BUE, dependent transfer back to room 2/2 fatigue.  To promote BUE and cardiovascular endurance needed for functional transfers and ADL tasks, pt  completed the following on SCIFIT: 3 mins forwards on level 3, intermittent rest break, then 3 mins backwards on level 3. Pt able to maintain talking during exercises, with no SOB. Discussed with nursing about toilet transfers and the difficulty nursing had with squat pivot last night with therapist requesting next PT to try slideboard transfer with pt 2/2 poor hip clearance/pain and update safety transfer sheet as appropriate to increase safety of staff/pt. Pt was left seated in room, with amputee leg rest applied with prosthesis doffed for pain management, NT in room, belt alarm on and all needs in reach at end of session.   Therapy Documentation Precautions:  Precautions Precautions: Fall, Other (comment) Precaution Comments: H/o L BKA (prosthetic in room) Other Brace: RLE darco shoe (in room) Restrictions Weight Bearing Restrictions: Yes RLE Weight Bearing: Partial weight bearing Other Position/Activity Restrictions: minimal WB through R heel for transfers only wearing DARCO shoe    Therapy/Group: Individual Therapy  Melvyn Novas, MS, OTR/L  03/12/2022, 7:32 AM

## 2022-03-12 NOTE — Progress Notes (Signed)
Occupational Therapy Session Note  Patient Details  Name: Austin [name] MRN: [id number] Date of Birth: 07/29/1956  Today's Date: 03/12/2022 OT Individual Time: 1415-1453 OT Individual Time Calculation (min): 38 min    Short Term Goals: Week 1:  OT Short Term Goal 1 (Week 1): Pt will don pull up underwear with min A. OT Short Term Goal 2 (Week 1): Pt will don pants with min A. OT Short Term Goal 3 (Week 1): Pt will don DARCO shoe with min A. OT Short Term Goal 4 (Week 1): Pt will toilet with min A.  Skilled Therapeutic Interventions/Progress Updates:    Patient received up in wheelchair, sleepy.  Patient aware that he had therapy, and was also expecting prosthetist to visit regarding L Prosthesis - rubbing on tibial tuberosity.  Patient agreeable to exercise but opted not to work on standing due to prosthetic issue.  Worked on UE strengthening.  Chair push ups, Sustained chair push ups, and red theraband to address shoulder flexion with elbow extension, abduction/adduction, elbow flexion.   Assisted patient to transfer into recliner chair and provided alignment and stretch to RLE to promote knee extension and reduce hip ext rotation.   Safety belt in place and engaged, phone, personal items and call bell within reach.    Therapy Documentation Precautions:  Precautions Precautions: Fall, Other (comment) Precaution Comments: H/o L BKA (prosthetic in room) Other Brace: RLE darco shoe (in room) Restrictions Weight Bearing Restrictions: Yes RLE Weight Bearing: Partial weight bearing Other Position/Activity Restrictions: minimal WB through R heel for transfers only wearing DARCO shoe   Pain: Pain Assessment Pain Scale: 0-10 Pain Score: 5  Pain Type: Acute pain Pain Location: Leg Pain Orientation: Left Pain Descriptors / Indicators: Aching Pain Intervention(s): Medication (See eMAR)     Therapy/Group: Individual Therapy  Collier Salina 03/12/2022, 3:01 PM

## 2022-03-13 LAB — GLUCOSE, CAPILLARY
Glucose-Capillary: 171 mg/dL — ABNORMAL HIGH (ref 70–99)
Glucose-Capillary: 209 mg/dL — ABNORMAL HIGH (ref 70–99)
Glucose-Capillary: 175 mg/dL — ABNORMAL HIGH (ref 70–99)
Glucose-Capillary: 137 mg/dL — ABNORMAL HIGH (ref 70–99)

## 2022-03-13 NOTE — Progress Notes (Signed)
Patient ID: Austin [name], male   DOB: [birth year], 66 y.o.   MRN: [id number]  SW made to attempt to call patient family yo provide team conference updates. Unable to leave VM due to mailbox being full. SW will continue to follow up.

## 2022-03-13 NOTE — Progress Notes (Signed)
PROGRESS NOTE   Subjective/Complaints: No complaints this morning Working with Vicente Males [employer] adding soft patch to prosthetic yesterday.  Team conference today   ROS: +right foot pain, sleeping well at night, denies constipation.    Objective:   No results found. Recent Labs    03/11/22 0551  WBC 10.2  HGB 13.5  HCT 40.2  PLT 302   Recent Labs    03/11/22 0551  NA 137  K 4.2  CL 106  CO2 25  GLUCOSE 219*  BUN 13  CREATININE 0.89  CALCIUM 9.0    Intake/Output Summary (Last 24 hours) at 03/13/2022 1050 Last data filed at 03/13/2022 [id number] Gross per 24 hour  Intake 812 ml  Output 1725 ml  Net -913 ml     Pressure Injury 03/08/22 Pretibial Left;Proximal Unstageable - Full thickness tissue loss in which the base of the injury is covered by slough (yellow, tan, gray, green or brown) and/or eschar (tan, brown or black) in the wound bed. Unstageable caused by (Active)  03/08/22 1515  Location: Pretibial  Location Orientation: Left;Proximal  Staging: Unstageable - Full thickness tissue loss in which the base of the injury is covered by slough (yellow, tan, gray, green or brown) and/or eschar (tan, brown or black) in the wound bed.  Wound Description (Comments): Unstageable caused by prothesis.  Present on Admission: Yes    Physical Exam: Vital Signs Blood pressure 135/81, pulse 63, temperature 98.2 F (36.8 C), temperature source Oral, resp. rate 17, height 6\' 4"  (1.93 m), weight 98.4 kg, SpO2 98 %. Gen: no distress, normal appearing, BMI 26.41 HEENT: oral mucosa pink and moist, NCAT Cardio: Reg rate Chest: normal effort, normal rate of breathing Abd: soft, non-distended Ext: no edema Psych: pleasant, normal affect Skin:    General: Skin is warm and dry.     Comments: Dry scabbed ulcer on left tibial tuberosity. Right foot with dry dressing. Old Left 2nd digit amputation site well healed. Wearing DARCO  show. Moving extremities well Neurological:     Mental Status: He is alert and oriented to person, place, and time.    Assessment/Plan: 1. Functional deficits which require 3+ hours per day of interdisciplinary therapy in a comprehensive inpatient rehab setting. Physiatrist is providing close team supervision and 24 hour management of active medical problems listed below. Physiatrist and rehab team continue to assess barriers to discharge/monitor patient progress toward functional and medical goals  Care Tool:  Bathing    Body parts bathed by patient: Right arm, Left arm, Chest, Abdomen     Body parts n/a: Right lower leg, Left lower leg   Bathing assist Assist Level: Supervision/Verbal cueing     Upper Body Dressing/Undressing Upper body dressing   What is the patient wearing?: Pull over shirt    Upper body assist Assist Level: Supervision/Verbal cueing    Lower Body Dressing/Undressing Lower body dressing      What is the patient wearing?: Incontinence brief, Pants     Lower body assist Assist for lower body dressing: Moderate Assistance - Patient 50 - 74% (bed level)     Toileting Toileting    Toileting assist Assist for toileting: Moderate Assistance -  Patient 50 - 74% [facility]) Assistive Device Comment: urinal and bedpan.   Transfers Chair/bed transfer  Transfers assist     Chair/bed transfer assist level: Minimal Assistance - Patient > 75% (squat<>pivot)     Locomotion Ambulation   Ambulation assist   Ambulation activity did not occur: Safety/medical concerns          Walk 10 feet activity   Assist  Walk 10 feet activity did not occur: Safety/medical concerns        Walk 50 feet activity   Assist Walk 50 feet with 2 turns activity did not occur: Safety/medical concerns         Walk 150 feet activity   Assist Walk 150 feet activity did not occur: Safety/medical concerns         Walk 10 feet on uneven surface   activity   Assist Walk 10 feet on uneven surfaces activity did not occur: Safety/medical concerns         Wheelchair     Assist Is the patient using a wheelchair?: Yes Type of Wheelchair: Manual    Wheelchair assist level: Supervision/Verbal cueing Max wheelchair distance: >321ft    Wheelchair 50 feet with 2 turns activity    Assist        Assist Level: Supervision/Verbal cueing   Wheelchair 150 feet activity     Assist      Assist Level: Supervision/Verbal cueing   Blood pressure 135/81, pulse 63, temperature 98.2 F (36.8 C), temperature source Oral, resp. rate 17, height 6\' 4"  (1.93 m), weight 98.4 kg, SpO2 98 %.  Medical Problem List and Plan: 1. Functional deficits secondary to right 5th digit amputation             -patient may shower             -ELOS/Goals: 10-14 days S            Continue CIR            -Interdisciplinary Team Conference today   2.  H/o DVT: Continue eliquis             -antiplatelet therapy:  ASA/Plavix 3. Pain Management:  continue Oxycodone prn. Provided with pain relief journal 4. Mood: LCSW to follow for evaluation and support.              -antipsychotic agents: N/A 5. Neuropsych: This patient is capable of making decisions on his own behalf. 6. Skin/Wound Care:  Add protein supplement and vitamins to promote wound healing. 7. Fluids/Electrolytes/Nutrition: Monitor I/O. Check CMET in am             --Magnesium added to help with sleep, pain and BP 8. Osteomyelitis Right 5th toe s/p partial amputation: Augmentin with end date of 03/27/22  --follow up with ID after discharge.               --continue to monitor WBC--trending down.  9. CAD: Continue ASA, Plavix, Cozaar and Lipitor. M--monitor for symptoms with increase in activity.  10. T2DM: Hgb A1C- 11.8 (was 12.2 09/2021)--followed by PCP/nutritionist in [place]              --continue Novolog 10 units tid for meal coverage with SSI for elevated BS.   Discussed  CBGs with patient.   Increase Lantus to 33U BID             --reports compliance with insulin regimen/diet PTA.  11. PAD/ L  BKA and right 5th toe transmet  amputation: Limit WB on right foot to minimal for transfers only.  12.  A fib: Monitor HR/BP TID. Continue Eliquis.  13. Acute blood loss anemia: Recheck CBC in am.  14. Vitamin D deficiency: start ergocalciferol 50,000 U once per week for 7 weeks 15. Suboptimal magnesium level: start supplement today 16. Screening for B12 deficiency: B12 reviewed and is normal at 746 on 02/16/22  LOS: 5 days A FACE TO FACE EVALUATION WAS PERFORMED  Austin [name] P Marlea Gambill 03/13/2022, 10:50 AM

## 2022-03-13 NOTE — Progress Notes (Signed)
Occupational Therapy Session Note  Patient Details  Name: Austin [name] MRN: [id number] Date of Birth: [birth year]  Today's Date: 03/13/2022 OT Individual Time: 1448-1856 & 3149-7026 OT Individual Time Calculation (min): 71 min & 70 min   Short Term Goals: Week 1:  OT Short Term Goal 1 (Week 1): Pt will don pull up underwear with min A. OT Short Term Goal 2 (Week 1): Pt will don pants with min A. OT Short Term Goal 3 (Week 1): Pt will don DARCO shoe with min A. OT Short Term Goal 4 (Week 1): Pt will toilet with min A.  Skilled Therapeutic Interventions/Progress Updates:  Session 1 Skilled OT intervention completed with focus on toilet transfers, simulated toileting tasks, lateral leans, RLE strengthening for prep with LB clothing. Pt received seated in recliner, 5/10 pain in L tibial tuberosity where a sore is, pre-medicated, with pt declining standing this session 2/2 pain therefore therapist utilizing modifications for pain management. To promote safety and independence with toileting, therapist educated pt on bariatric drop arm BSC transfer via slideboard, with pt able to return demonstrate from recliner > L using slideboard with CGA to commode > CGA > L > w/c. Pt self-propelled in w/c with BUE <> gym with supervision. Occasional cues needed for not bumping into walls on turns. Education provided about setting up slide board for proper placement with pt able to do so with supervision. Slide board transfer > R to EOM with CGA.  Using red theraband around pt's thighs, pt laterally leaned R<>L with CGA/supervision to donn band over buttocks and back down to thighs in order to promote hip clearance needed for clothing management with toileting tasks. Therapist applied BUE push up blocks to each side of pt to better simulate safe and more restricted lean R<>L while on BBSC. Then completed 2x10 tricep push ups on blocks for BUE strength and hip clearance needed for toilet transfers and requirements.  To assist with ease of donning RLE into pants, pt able to complete AROM knee extension with visual target x10 with 5 sec hold, with pt unable to achieve only 50% of extension, as well as knee raises x10. Pt with significant tightness in RLE hamstrings and weak quads, therefore compensatory methods educated on regarding how to use LUE to assist with threading RLE into pants and during functional transfers. Supervision for board placement, then slide board > L with CGA to w/c. Pt was left seated in w/c, with belt alarm on, amputee support leg rest for passive knee extension stretch, and all needs in reach at end of session.  Session  Skilled OT intervention completed with focus on BUE endurance and strengthening, w/c mobility. Pt received seated in recliner, unrated pain in L tibial tuberosity sore and R knee, pre-medicated, with pt declining standing 2/2 pain and preference of letting the sore air out. Slideboard transfer > R from recliner > w/c with CGA. Pt self-propelled from room to elevators and outside with supervision, cues needed for turns. Seated in w/c, pt completed the following to promote strengthening of BUE needed for functional tasks and transfers:  (With red theraband) 3x10 reps Horizontal abduction Self-anchored shoulder flexion each arm Self-anchored bicep flexion each arm Alternating chest presses Shoulder external rotation Shoulder extension Shoulder diagonal pulls Therapist anchored scapular retraction Multimodal cues needed for form and positioning.   Self-propelled within gift shop to locate and identify 3 specific items, with pt able to remember all 3 items however needing guidance on finding items even in plain  view. Pt expressing fatigue, therefore transported dependently back to room. Pt preferring to return to recliner vs bed, with slideboard transfer > R with CGA. Pt requested therapist to ask nursing for meds for a cough, with therapist notifying nurse. Pt was left upright  in recliner, with belt alarm on and all needs in reach at end of session.   Therapy Documentation Precautions:  Precautions Precautions: Fall, Other (comment) Precaution Comments: H/o L BKA (prosthetic in room) Other Brace: RLE darco shoe (in room) Restrictions Weight Bearing Restrictions: Yes RLE Weight Bearing: Partial weight bearing Other Position/Activity Restrictions: minimal WB through R heel for transfers only wearing DARCO shoe    Therapy/Group: Individual Therapy  Melvyn Novas, MS, OTR/L  03/13/2022, 7:27 AM

## 2022-03-13 NOTE — Progress Notes (Signed)
Physical Therapy Session Note  Patient Details  Name: Austin [name] MRN: [id number] Date of Birth: [birth year]  Today's Date: 03/13/2022 PT Individual Time: 0831-0943 PT Individual Time Calculation (min): 72 min   Short Term Goals: Week 1:  PT Short Term Goal 1 (Week 1): Pt will perform supine<>sit with supervision PT Short Term Goal 2 (Week 1): Pt will perform bed<>chair transfers using LRAD simulating home set-up with CGA PT Short Term Goal 3 (Week 1): Pt will perform real-life car transfer with min assist PT Short Term Goal 4 (Week 1): Pt will demonstrate ability to set-up transfers managing w/c parts with only intermittent cuing  Skilled Therapeutic Interventions/Progress Updates:   Received pt semi-reclined in bed, pt agreeable to PT treatment, and denied any pain during session. Session with emphasis on functional mobility/transfers, dressing, generalized strengthening and endurance, and dynamic standing balance/coordination. Removed dirty underwear and voided in urinal with set up assist. Brett Canales from [place] came by yesterday and added padding to L tibial tuberosity of prosthetic to prevent rubbing and pt reported immediate relief. Pt transferred semi-reclined<>sitting EOB with HOB elevated and use of bedrails with supervision and increased time/effort. Donned underwear and pants sitting EOB with max A. Pt initially attempted without assist, but demonstrated posterior LOB requiring mod A to correct. Pt attempted to pull underwear/pants over hips via lateral leans, however due to poor core control encouraged pt to return to supine due to LOB. Sit<>supine with supervision and rolled L/R with mod A and required max/total A to pull over hips - required supine rest break afterwards then transferred supine<>sitting EOB with HOB elevated and mod A and donned pull over shirt with supervision. Donned gel liner and L prosthetic with supervision and R Darco shoe with total A. Pt transferred bed<>WC  squat<>pivot to L with CGA/min A. Pt sat in WC at sink and brushed teeth/washed face with set up assist. Pt performed WC mobility 171ft using BUE and supervision to main therapy gym and MD arrived for morning rounds. Worked on sit<>stands inside // bars x3 reps with mod fading to min A. In standing pt unable to place R heel on ground due to continued hamstring tightness and limited by increased pain along L tibial tuberosity wound. Performed seated passive hamstring stretch for 8 minutes with RLE supported on bench with gentle overpressure to promote knee extension - noted improved knee extension ROM after stretching. Pt transported back to room in [facility] dependently and RN notified and present to administer pain medication. Pt requesting to remove dressing from wound to let it "air out" in between therapies - RN confirmed that this was okay. Pt transferred WC<>recliner squat<>pivot with mod A due to difficulty clearing buttocks over recliner armrest and with increased difficulty as L prosthetic not donned due to pain on wound. Removed dressing per pt request. Concluded session with pt sitting in recliner, needs within reach, and seatbelt alarm on.   Of note, increased difficutly getting prosthetic to click in during session, possibly due to new foam padding insert - may need to contact [employer] if issue continues.   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

## 2022-03-13 NOTE — Patient Care Conference (Signed)
Inpatient RehabilitationTeam Conference and Plan of Care Update Date: 03/13/2022   Time: 11:30 AM    Patient Name: Austin [name]      Medical Record Number: [id number]  Date of Birth: [birth year] Sex: Male         Room/Bed: 4M03C/4M03C-01 Payor Info: Payor: Multimedia programmer / Plan: [facility] MEDICARE / Product Type: *No Product type* /    Admit Date/Time:  03/08/2022  3:15 PM  Primary Diagnosis:  S/P transmetatarsal amputation of foot [facility])  Hospital Problems: Principal Problem:   S/P transmetatarsal amputation of foot [facility])    Expected Discharge Date: Expected Discharge Date: 03/21/22  Team Members Present: Physician leading conference: Dr. Sula Soda Social Worker Present: Lavera Guise, BSW Nurse Present: Kennyth Arnold, RN PT Present: Raechel Chute, PT OT Present: Candee Furbish, OT PPS Coordinator present : Edson Snowball, PT     Current Status/Progress Goal Weekly Team Focus  Bowel/Bladder   able to void without any assistance  to continue to be continent  Toileting in a timely manner   Swallow/Nutrition/ Hydration             ADL's   Mod A bathing, min A UB dressing, max A LB dressing, mod A toileting, min A lateral scoot/squat pivot toilet transfer  mod I  safety awareness, ADL retraining, AE education, functional endurance   Mobility   bed mobility min A, squat<>pivot transfers mod A, sit<>stands in // bars max/mod A, unable to ambulate due to precautions  supervision/mod I WC mobility  functional mobility/transfers, generalized strengthening and endurance, dynamic standing balance/coordination, and D/C planning.   Communication             Safety/Cognition/ Behavioral Observations            Pain   no pain reported  to remain painfree  continue to monitor   Skin   skin intact  to remain intact  assess daily     Discharge Planning:  discharging home with Sister and primary caregiver. S.O and brother to assist as well   Team  Discussion: Doing well. No complaints. Max dose of long acting insulin. [employer] added padding to prothesis. Continent B/B. Tylenol for pain. Right venous stasis ulcer. Recommending home health. Gave education on weight loss and diabetes over the weekend. Will need a new WC, will check on insurance coverage.   Patient on target to meet rehab goals: yes, supervision/mod I for WC mobility. Mod I goals for ADL's, will downgrade. Currently supervision to min assist bed mobility, transfers. Limited due to weight bearing status through right leg. Mod assist bathing, max assist lower body dressing.  *See Care Plan and progress notes for long and short-term goals.   Revisions to Treatment Plan:  Medication adjustments, therapy to downgrade ADL goals   Teaching Needs: Family education, medication/pain management, skin/wound care, transfer training, etc.   Current Barriers to Discharge: Decreased caregiver support, Home enviroment access/layout, Wound care, Lack of/limited family support, and Weight bearing restrictions  Possible Resolutions to Barriers: Family education Home Health referral Order recommended DME     Medical Summary Current Status: residual limb pain, overweight, weightbearing limitations, wound over left tibial tuberocity, wound at 5th ray amputation on right, type 2 DM with hyperglycemia  Barriers to Discharge: Weight bearing restrictions;Weight;Medical stability;Wound care  Barriers to Discharge Comments: residual limb pain, overweight, weightbearing limitations, wound over left tibial tuberocity, wound at 5th ray amputation on right, type 2 DM with hyperglycemia Possible Resolutions to [employer] Focus: continue tylenol  prn, provided dietary education, consulted [employer] to apply support for left tuberosity wound, continue daily wound care, increased long acting insulin   Continued Need for Acute Rehabilitation Level of Care: The patient requires daily medical management by  a physician with specialized training in physical medicine and rehabilitation for the following reasons: Direction of a multidisciplinary physical rehabilitation program to maximize functional independence : Yes Medical management of patient stability for increased activity during participation in an intensive rehabilitation regime.: Yes Analysis of laboratory values and/or radiology reports with any subsequent need for medication adjustment and/or medical intervention. : Yes   I attest that I was present, lead the team conference, and concur with the assessment and plan of the team.   Tennis Must 03/13/2022, 12:17 PM

## 2022-03-14 LAB — GLUCOSE, CAPILLARY
Glucose-Capillary: 165 mg/dL — ABNORMAL HIGH (ref 70–99)
Glucose-Capillary: 169 mg/dL — ABNORMAL HIGH (ref 70–99)
Glucose-Capillary: 188 mg/dL — ABNORMAL HIGH (ref 70–99)
Glucose-Capillary: 278 mg/dL — ABNORMAL HIGH (ref 70–99)

## 2022-03-14 MED ORDER — MEDIHONEY WOUND/BURN DRESSING EX PSTE
1.0000 "application " | PASTE | Freq: Every day | CUTANEOUS | Status: DC
  Administered 2022-03-14 – 2022-03-21 (×8): 1 via TOPICAL
  Filled 2022-03-14: qty 44

## 2022-03-14 NOTE — Progress Notes (Signed)
Occupational Therapy Session Note  Patient Details  Name: Austin [name] MRN: [id number] Date of Birth: 1956/06/20  Today's Date: 03/14/2022 OT Group Time: 1430-1530 OT Group Time Calculation (min): 60 min   Short Term Goals: Week 1:  OT Short Term Goal 1 (Week 1): Pt will don pull up underwear with min A. OT Short Term Goal 2 (Week 1): Pt will don pants with min A. OT Short Term Goal 3 (Week 1): Pt will don DARCO shoe with min A. OT Short Term Goal 4 (Week 1): Pt will toilet with min A.  Skilled Therapeutic Interventions/Progress Updates:  Pt participated in group session with a focus on stress mgmt, education on healthy coping strategies, and social interaction. Focus of session on providing coping strategies to manage new diagnosis to allow for improved mental health to increase overall quality of life . Discussed how to break down stressors into "daily hassles," "major life stressors" and "life circumstances" in an effort to allow pts to chunk their stressors into groups and determine where to best put their efforts/time when dealing with stress. Pt actively sharing stressors and contributing to group conversation. Provided active listening, emotional support and therapeutic use of self. Offered education on factors that protect [place] against stress such as "daily uplifts," "healthy coping strategies" and "protective factors." Encouraged all group members to make an effort to actively recall one event from their day that was a daily uplift in an effort to protect their mindset from stressors as well as sharing this information with their caregivers to facilitate improved caregiver communication and decrease overall burden of care.  Issued pt handouts on healthy coping strategies to implement into routine. Pt completed w/c mobility back to room with supervision.   Therapy Documentation Precautions:  Precautions Precautions: Fall, Other (comment) Precaution Comments: H/o L BKA (prosthetic in  room) Other Brace: RLE darco shoe (in room) Restrictions Weight Bearing Restrictions: Yes RLE Weight Bearing: Touchdown weight bearing Other Position/Activity Restrictions: minimal WB through R heel for transfers only wearing DARCO shoe    Pain: no pain reported during session     Therapy/Group: Group Therapy  Barron Schmid 03/14/2022, 4:01 PM

## 2022-03-14 NOTE — Progress Notes (Signed)
Occupational Therapy Session Note  Patient Details  Name: Austin [name] MRN: [id number] Date of Birth: 1956/05/08  Today's Date: 03/14/2022 OT Individual Time: [id number] OT Individual Time Calculation (min): 28 min    Short Term Goals: Week 1:  OT Short Term Goal 1 (Week 1): Pt will don pull up underwear with min A. OT Short Term Goal 2 (Week 1): Pt will don pants with min A. OT Short Term Goal 3 (Week 1): Pt will don DARCO shoe with min A. OT Short Term Goal 4 (Week 1): Pt will toilet with min A.  Skilled Therapeutic Interventions/Progress Updates:    Patient received supine in bed - slow to wake, but agreeable to OT.  Skilled OT intervention focused on improving independence and safety with simple self care skills.  Patient able to bathe self at edge of bed, preferring to "wipe off" then shower - even prior to this hospitalization.  Patient states he has a bench at home, but has never put it into his tub/shower.  Patient able to bathe and dress self following set up using significant lateral and posterior leaning.  Used squat pivot transfer as patient has tender point distal residual limb, and prosthesis not comfortable at this time - per patient,prosthetist made adjustments to prosthesis.   Patient left up in wheelchair at sink with safety belt on and engaged.  Patient can maneuver wheelchair in room without assistance.    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

## 2022-03-14 NOTE — Progress Notes (Signed)
Physical Therapy Session Note  Patient Details  Name: Austin [name] MRN: [id number] Date of Birth: June 19, 1956  Today's Date: 03/14/2022 PT Individual Time: 7858-8502 PT Individual Time Calculation (min): 56 min   Short Term Goals: Week 1:  PT Short Term Goal 1 (Week 1): Pt will perform supine<>sit with supervision PT Short Term Goal 2 (Week 1): Pt will perform bed<>chair transfers using LRAD simulating home set-up with CGA PT Short Term Goal 3 (Week 1): Pt will perform real-life car transfer with min assist PT Short Term Goal 4 (Week 1): Pt will demonstrate ability to set-up transfers managing w/c parts with only intermittent cuing  Skilled Therapeutic Interventions/Progress Updates:   Received pt sitting in WC with R Darco shoe donned. Pt expressing concerns regarding D/C date, stating that he doesn't think he will be ready in time. Informed pt that this is an estimate and the D/C date can always be changed depending on mobility status. Pt agreeable to PT treatment and denied any pain initially but reported soreness along L tibial tuberosity wound with pressure from liner and prosthetic. Session with emphasis on functional mobility/transfers, generalized strengthening and endurance, and WC mobility. Pt voided in urinal seated with set up assist and discussed getting pt new WC, ultra-light prior to D/C - contacted CSW. Donned gel liner and L prosthetic with max A as pt with difficutly getting prosthetic to click in with new padding at tibial tuberosity. Pt performed WC mobility 16ft x 2 trials using BUE and supervision to/from main therapy gym. Pt transferred WC<>mat squat<>pivot to R with min A and sit<>semi-reclined on wedge with supervision and increased effort. Pt performed the following exercises with supervision and verbal cues for technique with emphasis on UE/LE strength while performing passive R hamstring stretch against gravity -LLE SLR 2x10 -LLE hip abduction 2x10 -hip adduction ball  squeezes 2x10 -overhead chest press with 9lb dowel 2x20 Provided pt with 20x18 WC for improved fit/comfort and pt transferred mat<>WC squat<>pivot with min A into new chair without prosthetic. Concluded session with pt siting in [facility], needs within reach, and seatbelt alarm on.   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

## 2022-03-14 NOTE — Consult Note (Signed)
WOC Nurse Consult Note: Reason for Consult: s/p Right common femoral to TPT bypass with vein and partial 5th ray amputation of right foot.  Trauma wound to left anterior lower leg, chronic per patient.  Wound type:Vascular, surgical and trauma Pressure Injury POA: NA Measurement: Right foot managed by podiatry and orders in place for daily Xeroform gauze dressing changes.  Bedside RN to perform.  Right leg and groin vascular procedure sites are healing, scabbed and dry.  No drainage or erythema noted.   Left leg is 1 cm round lesion with 0.5 cm scabbed center.  Will implement medihoney to clean this wound of devitalized tissue.  Wound bed: scabbed Drainage (amount, consistency, odor) none noted Periwound: dry skin Dressing procedure/placement/frequency:Moisturize legs daily, including right leg incision site.  Cleanse left leg wound with NS and pat dry. Apply medihoney to wound bed.  Cover with gauze and foam dressing. Change daily.  Will not follow at this time.  Please re-consult if needed.  Domenic Moras MSN, RN, FNP-BC CWON Wound, Ostomy, Continence Nurse Pager [phone]

## 2022-03-14 NOTE — Evaluation (Signed)
Recreational Therapy Assessment and Plan  Patient Details  Name: Austin [name] MRN: [id number] Date of Birth: [birth year] Today's Date: 03/14/2022  Rehab Potential:  Good ELOS:   d/c 6/15  Assessment  Hospital Problem: Principal Problem:   S/P transmetatarsal amputation of foot [facility])     Past Medical History:      Past Medical History:  Diagnosis Date   Coronary artery disease     Diabetes ([place])     DVT (deep venous thrombosis) ([place])     Gangrene of left foot ([place]) 02/09/2018   GERD (gastroesophageal reflux disease)     Hyperlipidemia     Hypertension     Peripheral vascular disease ([place])      Past Surgical History:       Past Surgical History:  Procedure Laterality Date   ABDOMINAL AORTOGRAM W/LOWER EXTREMITY N/A 02/20/2022    Procedure: ABDOMINAL AORTOGRAM W/LOWER EXTREMITY;  Surgeon: Marty Heck, MD;  Location: [place] [facility];  Service: Cardiovascular;  Laterality: N/A;   AMPUTATION Left 10/28/2018    Procedure: AMPUTATION BELOW KNEE;  Surgeon: Algernon Huxley, MD;  Location: [facility];  Service: Vascular;  Laterality: Left;   AMPUTATION Left 09/10/2021    Procedure: AMPUTATION DIGIT;  Surgeon: Cindra Presume, MD;  Location: [facility];  Service: Plastics;  Laterality: Left;   AMPUTATION Left 09/14/2021    Procedure: AMPUTATION DIGIT left long finger and irrigation and debridement;  Surgeon: Cindra Presume, MD;  [facility]: [facility];  Service: Plastics;  Laterality: Left;   AMPUTATION Right 02/27/2022    Procedure: RIGHT PARTIAL AMPUTATION FOOT;  Surgeon: Edrick Kins, DPM;  Location: [place];  Service: Podiatry;  Laterality: Right;   AMPUTATION TOE Left 02/10/2018    Procedure: AMPUTATION TOE;  Surgeon: Sharlotte Alamo, DPM;  Location: [facility];  Service: Podiatry;  Laterality: Left;   APPLICATION OF WOUND VAC Left 10/16/2018    Procedure: APPLICATION OF WOUND VAC;  Surgeon: Samara Deist, DPM;  Location: [facility];  Service: Podiatry;  Laterality: Left;   DORSAL SLIT N/A  01/12/2021    Procedure: DORSAL SLIT;  Surgeon: Billey Co, MD;  Location: [facility];  Service: Urology;  Laterality: N/A;   FEMORAL-TIBIAL BYPASS GRAFT Right 02/25/2022    Procedure: RIGHT LOWER EXTREMITY FEMORAL TO TIBIAL PERONEAL TRUNK BYPASS;  Surgeon: Marty Heck, MD;  Location: [place];  Service: Vascular;  Laterality: Right;  INSERT ARTERIAL LINE   groin surgery       IRRIGATION AND DEBRIDEMENT FOOT Left 09/30/2018    Procedure: IRRIGATION AND DEBRIDEMENT FOOT;  Surgeon: Sharlotte Alamo, DPM;  Location: [facility];  Service: Podiatry;  Laterality: Left;   LOWER EXTREMITY ANGIOGRAPHY Left 02/12/2018    Procedure: Lower Extremity Angiography;  Surgeon: Algernon Huxley, MD;  Location: [place] [facility];  Service: Cardiovascular;  Laterality: Left;   LOWER EXTREMITY ANGIOGRAPHY Left 10/01/2018    Procedure: Lower Extremity Angiography;  Surgeon: Algernon Huxley, MD;  Location: [place] [facility];  Service: Cardiovascular;  Laterality: Left;   LOWER EXTREMITY ANGIOGRAPHY Left 10/19/2018    Procedure: Lower Extremity Angiography;  Surgeon: Algernon Huxley, MD;  Location: [place] [facility];  Service: Cardiovascular;  Laterality: Left;   LOWER EXTREMITY ANGIOGRAPHY Left 10/20/2018    Procedure: LOWER EXTREMITY ANGIOGRAPHY;  Surgeon: Algernon Huxley, MD;  Location: [place] [facility];  Service: Cardiovascular;  Laterality: Left;   LOWER EXTREMITY ANGIOGRAPHY Right 11/25/2018    Procedure: LOWER EXTREMITY ANGIOGRAPHY;  Surgeon: Algernon Huxley, MD;  Location: [place] [facility];  Service: Cardiovascular;  Laterality: Right;   LOWER EXTREMITY ANGIOGRAPHY Right 01/10/2020    Procedure: LOWER EXTREMITY ANGIOGRAPHY;  Surgeon: Algernon Huxley, MD;  Location: [place] [facility];  Service: Cardiovascular;  Laterality: Right;   LOWER EXTREMITY ANGIOGRAPHY Right 12/11/2020    Procedure: LOWER EXTREMITY ANGIOGRAPHY;  Surgeon: Algernon Huxley, MD;  Location: [place] [facility];  Service: Cardiovascular;  Laterality:  Right;   LOWER EXTREMITY INTERVENTION   02/12/2018    Procedure: LOWER EXTREMITY INTERVENTION;  Surgeon: Algernon Huxley, MD;  Location: [place] [facility];  Service: Cardiovascular;;   MINOR IRRIGATION AND DEBRIDEMENT OF WOUND Left 09/10/2021    Procedure: IRRIGATION AND DEBRIDEMENT OF LEFT LONG FINGER WOUND;  Surgeon: Cindra Presume, MD;  [facility];  Service: Plastics;  Laterality: Left;   NECK SURGERY       PERIPHERAL VASCULAR INTERVENTION   02/20/2022    Procedure: PERIPHERAL VASCULAR INTERVENTION;  Surgeon: Marty Heck, MD;  Location: [place] [facility];  Service: Cardiovascular;;  right iliac   TRANSMETATARSAL AMPUTATION Left 10/16/2018    Procedure: TRANSMETATARSAL AMPUTATION LEFT FOOT;  Surgeon: Samara Deist, DPM;  Location: [facility];  Service: Podiatry;  Laterality: Left;   VEIN HARVEST Right 02/25/2022    Procedure: VEIN HARVEST OF RIGHT GREATER SAPHENOUS VEIN;  Surgeon: Marty Heck, MD;  [facility];  Service: Vascular;  Laterality: Right;      Assessment & Plan Clinical Impression: Patient is a 66 y.o. 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 CTA head/neck was negative for LVO.  MRI brain negative for acute changes and showed moderately advanced chronic changes with small remote left cerebellar infarct.  He was started on broad spectrum antibiotics and podiatry/Dr.Stover as well as Dr. Carlis Abbott were consulted for input as patient not in favor of losing his leg.    He underwent aortogram with R-external iliac artery angioplasty and stent placement on 05/17 followed by right CFA to tibioperoneal trunk bypass with ipsilateral  nonreversed great saphenous vein on 05/22 in attempts at limb salvage.  He underwent partial 5th ray amputation of right foot by Dr. Amalia Hailey on 05/24 and to be minimally WB on RLE for transfers only  ID following for input on antibiotic regimen--surgical wound culture positive for rare Group B strep agalactiae and rare Staph aureus. Antibiotics narrowed to Augmentin X 4 weeks with end date of 06/21 and ID follow up at discharge.  PT/OT has been working with patient who continues to be limited by weakness requiring STEDY for standing attempts/WB precautions. CIR recommended due to functional decline. Currently complains of weakness. Patient transferred to CIR on 03/08/2022.    Pt presents with decreased activity tolerance, decreased functional mobility, decreased balance, difficulty maintaining precautions Limiting pt's independence with leisure/community pursuits.  Full TR eval deferred but referred by team and actively participate in a stress managment/coping group today.  Pt education/discussion focused on stress exploration including factors that contribute to stress, factors that protect against stress and potential coping strategies.  Coping strategies included deep breathing, progressive muscle relaxation, imagery & challenging irrational thoughts.  Handouts  provided.   Plan  No further TR  Recommendations for other services: None   Discharge Criteria: Patient will be discharged from TR if patient refuses treatment 3 consecutive times without medical reason.  If treatment goals not met, if there is a change in medical status, if patient makes no progress towards goals or if patient is discharged from hospital.  The above assessment, treatment plan, treatment alternatives and goals were discussed and mutually agreed upon: by patient  [place] 03/14/2022, 4:03 PM

## 2022-03-14 NOTE — Plan of Care (Signed)
  Problem: Consults Goal: RH GENERAL PATIENT EDUCATION Description: See Patient Education module for education specifics. Outcome: Progressing Goal: Skin Care Protocol Initiated - if Braden Score 18 or less Description: If consults are not indicated, leave blank or document N/A Outcome: Progressing Goal: Diabetes Guidelines if Diabetic/Glucose > 140 Description: If diabetic or lab glucose is > 140 mg/dl - Initiate Diabetes/Hyperglycemia Guidelines & Document Interventions  Outcome: Progressing   Problem: RH SKIN INTEGRITY Goal: RH STG MAINTAIN SKIN INTEGRITY WITH ASSISTANCE Description: STG Maintain Skin Integrity With min Assistance. Outcome: Progressing Goal: RH STG ABLE TO PERFORM INCISION/WOUND CARE W/ASSISTANCE Description: STG Able To Perform Incision/Wound Care With min Assistance. Outcome: Progressing   Problem: RH SAFETY Goal: RH STG ADHERE TO SAFETY PRECAUTIONS W/ASSISTANCE/DEVICE Description: STG Adhere to Safety Precautions With Cues and Reminders. Outcome: Progressing Goal: RH STG DECREASED RISK OF FALL WITH ASSISTANCE Description: STG Decreased Risk of Fall With [employer]. Outcome: Progressing   Problem: RH KNOWLEDGE DEFICIT GENERAL Goal: RH STG INCREASE KNOWLEDGE OF SELF CARE AFTER HOSPITALIZATION Description: Patient will demonstrate knowledge of self-care management, medication management, skin/wound care, weight bearing precautions with educational materials and handouts provided by staff independently at discharge. Outcome: Progressing

## 2022-03-14 NOTE — Progress Notes (Signed)
Occupational Therapy Session Note  Patient Details  Name: Austin [name] MRN: [id number] Date of Birth: 10/07/1956  Today's Date: 03/14/2022 OT Individual Time: 1300-1409 OT Individual Time Calculation (min): 69 min    Short Term Goals: Week 1:  OT Short Term Goal 1 (Week 1): Pt will don pull up underwear with min A. OT Short Term Goal 2 (Week 1): Pt will don pants with min A. OT Short Term Goal 3 (Week 1): Pt will don DARCO shoe with min A. OT Short Term Goal 4 (Week 1): Pt will toilet with min A.  Skilled Therapeutic Interventions/Progress Updates:    Patient received up in wheelchair needing to use restroom.  Patient assisted to bariatric BSC over toilet in bathroom, and cues for set up and safety with transfer.  Patient agreeable to shower - checked with nursing regarding Left LE dressing - nurse indicated ok to get dressing wet, as she would change once out of the shower.  Showered in actual tub/shower using tub transfer bench.  Patient needed min assist and cueingfor set up and safety to transfer slight incline into shower.  Able to wash self with set up and cueing - and very close supervision with lateral leans for buttocks.  Patient indicates he might feel more comfortable trying this at home - patient's friend did not participate in this session, but patient indicates she'd be willing to help him as needed.   Patient returned to room, and nursing in to do dressing changes.    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

## 2022-03-14 NOTE — Progress Notes (Addendum)
PROGRESS NOTE   Subjective/Complaints: No new complaints  Wound care consulted regarding tibial tuberosity wound  He says he has not yet heard his discharge date Working on self care at the sink.   ROS: +right foot pain, sleeping well at night, deniesconstipation.    Objective:   No results found. No results for input(s): "WBC", "HGB", "HCT", "PLT" in the last 72 hours.  No results for input(s): "NA", "K", "CL", "CO2", "GLUCOSE", "BUN", "CREATININE", "CALCIUM" in the last 72 hours.   Intake/Output Summary (Last 24 hours) at 03/14/2022 1009 Last data filed at 03/14/2022 [id number] Gross per 24 hour  Intake 700 ml  Output 1475 ml  Net -775 ml     Pressure Injury 03/08/22 Pretibial Left;Proximal Unstageable - Full thickness tissue loss in which the base of the injury is covered by slough (yellow, tan, gray, green or brown) and/or eschar (tan, brown or black) in the wound bed. Unstageable caused by (Active)  03/08/22 1515  Location: Pretibial  Location Orientation: Left;Proximal  Staging: Unstageable - Full thickness tissue loss in which the base of the injury is covered by slough (yellow, tan, gray, green or brown) and/or eschar (tan, brown or black) in the wound bed.  Wound Description (Comments): Unstageable caused by prothesis.  Present on Admission: Yes    Physical Exam: Vital Signs Blood pressure 120/75, pulse 76, temperature 97.8 F (36.6 C), temperature source Oral, resp. rate 18, height 6\' 4"  (1.93 m), weight 98.4 kg, SpO2 96 %. Gen: no distress, normal appearing, BMI 26.41 HEENT: oral mucosa pink and moist, NCAT Cardio: Reg rate Chest: normal effort, normal rate of breathing Abd: soft, non-distended Ext: no edema Psych: pleasant, normal affect Skin:    General: Skin is warm and dry.     Comments: 1cm Dry scabbed ulcer on left tibial tuberosity. Right foot with dry dressing. Old Left 2nd digit amputation site well  healed. Wearing DARCO show. Moving extremities well Neurological:     Mental Status: He is alert and oriented to person, place, and time.    Assessment/Plan: 1. Functional deficits which require 3+ hours per day of interdisciplinary therapy in a comprehensive inpatient rehab setting. Physiatrist is providing close team supervision and 24 hour management of active medical problems listed below. Physiatrist and rehab team continue to assess barriers to discharge/monitor patient progress toward functional and medical goals  Care Tool:  Bathing    Body parts bathed by patient: Right arm, Left arm, Chest, Abdomen     Body parts n/a: Right lower leg, Left lower leg   Bathing assist Assist Level: Supervision/Verbal cueing     Upper Body Dressing/Undressing Upper body dressing   What is the patient wearing?: Pull over shirt    Upper body assist Assist Level: Supervision/Verbal cueing    Lower Body Dressing/Undressing Lower body dressing      What is the patient wearing?: Incontinence brief, Pants     Lower body assist Assist for lower body dressing: Moderate Assistance - Patient 50 - 74% (bed level)     Toileting Toileting    Toileting assist Assist for toileting: Moderate Assistance - Patient 50 - 74% [facility]) Assistive Device Comment: urinal and bedpan.  Transfers Chair/bed transfer  Transfers assist     Chair/bed transfer assist level: Minimal Assistance - Patient > 75%     Locomotion Ambulation   Ambulation assist   Ambulation activity did not occur: Safety/medical concerns          Walk 10 feet activity   Assist  Walk 10 feet activity did not occur: Safety/medical concerns        Walk 50 feet activity   Assist Walk 50 feet with 2 turns activity did not occur: Safety/medical concerns         Walk 150 feet activity   Assist Walk 150 feet activity did not occur: Safety/medical concerns         Walk 10 feet on uneven surface   activity   Assist Walk 10 feet on uneven surfaces activity did not occur: Safety/medical concerns         Wheelchair     Assist Is the patient using a wheelchair?: Yes Type of Wheelchair: Manual    Wheelchair assist level: Supervision/Verbal cueing Max wheelchair distance: >352ft    Wheelchair 50 feet with 2 turns activity    Assist        Assist Level: Supervision/Verbal cueing   Wheelchair 150 feet activity     Assist      Assist Level: Supervision/Verbal cueing   Blood pressure 120/75, pulse 76, temperature 97.8 F (36.6 C), temperature source Oral, resp. rate 18, height 6\' 4"  (1.93 m), weight 98.4 kg, SpO2 96 %.  Medical Problem List and Plan: 1. 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  H/o DVT: Continue eliquis             -antiplatelet therapy:  ASA/Plavix 3. Pain Management:  continue Oxycodone prn. Provided with pain relief journal 4. Mood: LCSW to follow for evaluation and support.              -antipsychotic agents: N/A 5. Neuropsych: This patient is capable of making decisions on his own behalf. 6. Skin/Wound Care:  Add protein supplement and vitamins to promote wound healing. 7. Fluids/Electrolytes/Nutrition: Monitor I/O. Check CMET in am             --Magnesium added to help with sleep, pain and BP 8. Osteomyelitis Right 5th toe s/p partial amputation: Augmentin with end date of 03/27/22  --follow up with ID after discharge.               --continue to monitor WBC--trending down.  9. CAD: Continue ASA, Plavix, Cozaar and Lipitor. M--monitor for symptoms with increase in activity.  10. T2DM: Hgb A1C- 11.8 (was 12.2 09/2021)--followed by PCP/nutritionist in [place]              --continue Novolog 10 units tid for meal coverage with SSI for elevated BS.   Discussed CBGs with patient.   Increase Lantus to 33U BID              --reports compliance with insulin regimen/diet PTA.  11. 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  12.  A fib: Monitor HR/BP TID. Continue Eliquis.  13. Acute blood loss anemia: Recheck CBC in am.  14. Vitamin D deficiency: start ergocalciferol 50,000 U once per week for 7 weeks 15. Suboptimal magnesium level: start supplement today 16. Screening for B12 deficiency: B12 reviewed and is normal at 746 on 02/16/22  LOS: 6 days A FACE TO FACE EVALUATION WAS PERFORMED  Chariah Bailey P Maranatha Grossi 03/14/2022, 10:09 AM

## 2022-03-15 DIAGNOSIS — I2510 Atherosclerotic heart disease of native coronary artery without angina pectoris: Secondary | ICD-10-CM

## 2022-03-15 LAB — GLUCOSE, CAPILLARY
Glucose-Capillary: 155 mg/dL — ABNORMAL HIGH (ref 70–99)
Glucose-Capillary: 100 mg/dL — ABNORMAL HIGH (ref 70–99)
Glucose-Capillary: 170 mg/dL — ABNORMAL HIGH (ref 70–99)
Glucose-Capillary: 241 mg/dL — ABNORMAL HIGH (ref 70–99)

## 2022-03-15 MED ORDER — INSULIN GLARGINE-YFGN 100 UNIT/ML ~~LOC~~ SOLN
37.0000 [IU] | Freq: Two times a day (BID) | SUBCUTANEOUS | Status: DC
  Administered 2022-03-15 – 2022-03-17 (×4): 37 [IU] via SUBCUTANEOUS
  Filled 2022-03-15 (×6): qty 0.37

## 2022-03-15 NOTE — Progress Notes (Signed)
PROGRESS NOTE   Subjective/Complaints: Working with OT this AM. New new concerns. Reports pain controlled with current regimen.   ROS: NO fever, chills, SOB, HA, Chest pain   Objective:   No results found. No results for input(s): "WBC", "HGB", "HCT", "PLT" in the last 72 hours.  No results for input(s): "NA", "K", "CL", "CO2", "GLUCOSE", "BUN", "CREATININE", "CALCIUM" in the last 72 hours.   Intake/Output Summary (Last 24 hours) at 03/15/2022 0843 Last data filed at 03/15/2022 [id number] Gross per 24 hour  Intake 880 ml  Output 2050 ml  Net -1170 ml      Pressure Injury 03/08/22 Pretibial Left;Proximal Unstageable - Full thickness tissue loss in which the base of the injury is covered by slough (yellow, tan, gray, green or brown) and/or eschar (tan, brown or black) in the wound bed. Unstageable caused by (Active)  03/08/22 1515  Location: Pretibial  Location Orientation: Left;Proximal  Staging: Unstageable - Full thickness tissue loss in which the base of the injury is covered by slough (yellow, tan, gray, green or brown) and/or eschar (tan, brown or black) in the wound bed.  Wound Description (Comments): Unstageable caused by prothesis.  Present on Admission: Yes    Physical Exam: Vital Signs Blood pressure 107/68, pulse 92, temperature 98 F (36.7 C), temperature source Oral, resp. rate 18, height 6\' 4"  (1.93 m), weight 98.4 kg, SpO2 98 %. Gen: no distress, normal appearing, sitting in bed  HEENT: oral mucosa pink and moist, NCAT Cardio: RRR Chest: CTAB, normal effort, normal rate of breathing Abd: soft, non-distended Ext: no edema Psych: pleasant, normal affect Skin:    General: Skin is warm and dry.     Comments: 1cm Dry scabbed ulcer on left tibial tuberosity. Right foot with dry dressing. Old Left 2nd digit amputation site well healed. Wearing DARCO show. Moving extremities well Neurological:     Mental Status: He  is alert and oriented to person, place, and time.    Assessment/Plan: 1. Functional deficits which require 3+ hours per day of interdisciplinary therapy in a comprehensive inpatient rehab setting. Physiatrist is providing close team supervision and 24 hour management of active medical problems listed below. Physiatrist and rehab team continue to assess barriers to discharge/monitor patient progress toward functional and medical goals  Care Tool:  Bathing    Body parts bathed by patient: Right arm, Left arm, Chest, Abdomen, Front perineal area, Buttocks, Right upper leg, Left upper leg, Face, Right lower leg     Body parts n/a: Left lower leg   Bathing assist Assist Level: Set up assist     Upper Body Dressing/Undressing Upper body dressing   What is the patient wearing?: Pull over shirt    Upper body assist Assist Level: Set up assist    Lower Body Dressing/Undressing Lower body dressing      What is the patient wearing?: Incontinence brief, Pants     Lower body assist Assist for lower body dressing: Minimal Assistance - Patient > 75%     Toileting Toileting    Toileting assist Assist for toileting: Minimal Assistance - Patient > 75% Assistive Device Comment: bariatric BSC   Transfers Chair/bed transfer  Transfers  assist     Chair/bed transfer assist level: Minimal Assistance - Patient > 75%     Locomotion Ambulation   Ambulation assist   Ambulation activity did not occur: Safety/medical concerns          Walk 10 feet activity   Assist  Walk 10 feet activity did not occur: Safety/medical concerns        Walk 50 feet activity   Assist Walk 50 feet with 2 turns activity did not occur: Safety/medical concerns         Walk 150 feet activity   Assist Walk 150 feet activity did not occur: Safety/medical concerns         Walk 10 feet on uneven surface  activity   Assist Walk 10 feet on uneven surfaces activity did not occur:  Safety/medical concerns         Wheelchair     Assist Is the patient using a wheelchair?: Yes Type of Wheelchair: Manual    Wheelchair assist level: Supervision/Verbal cueing Max wheelchair distance: >321ft    Wheelchair 50 feet with 2 turns activity    Assist        Assist Level: Supervision/Verbal cueing   Wheelchair 150 feet activity     Assist      Assist Level: Supervision/Verbal cueing   Blood pressure 107/68, pulse 92, temperature 98 F (36.7 C), temperature source Oral, resp. rate 18, height 6\' 4"  (1.93 m), weight 98.4 kg, SpO2 98 %.  Medical Problem List and Plan: 1. 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  H/o DVT: Continue eliquis             -antiplatelet therapy:  ASA/Plavix 3. Pain Management:  continue Oxycodone prn. Provided with pain relief journal 4. Mood: LCSW to follow for evaluation and support.              -antipsychotic agents: N/A 5. Neuropsych: This patient is capable of making decisions on his own behalf. 6. Skin/Wound Care:  Add protein supplement and vitamins to promote wound healing. 7. Fluids/Electrolytes/Nutrition: Monitor I/O. Check CMET in am             --Magnesium added to help with sleep, pain and BP 8. Osteomyelitis Right 5th toe s/p partial amputation: Augmentin with end date of 03/27/22  --follow up with ID after discharge.               --continue to monitor WBC--trending down.   -WBC was 10.2 on 6/5, continue to follow 9. CAD: Continue ASA, Plavix, Cozaar and Lipitor. M--monitor for symptoms with increase in activity.   -Denies chest pain 10. T2DM: Hgb A1C- 11.8 (was 12.2 09/2021)--followed by PCP/nutritionist in [place]              --continue Novolog 10 units tid for meal coverage with SSI for elevated BS.   Discussed CBGs with patient.   Increase Lantus to 33U  BID             --reports compliance with insulin regimen/diet PTA.   -6/9 Lantus increased from 35u to 37 units to to elevated CBG 11. PAD/ L  BKA and right 5th toe transmet amputation: Limit WB on  right foot to minimal for transfers only.  12.  A fib: Monitor HR/BP TID. Continue Eliquis.  13. Acute blood loss anemia:   -HGB stable at 13.5 on 6/5 14. Vitamin D deficiency: start ergocalciferol 50,000 U once per week for 7 weeks 15. Suboptimal magnesium level: start supplement today 16. Screening for B12 deficiency: B12 reviewed and is normal at 746 on 02/16/22  LOS: 7 days A FACE TO FACE EVALUATION WAS PERFORMED  Jennye Boroughs 03/15/2022, 8:43 AM

## 2022-03-15 NOTE — Patient Instructions (Signed)
  1) Seated Row   Sit up straight with elbows by your sides. Pull back with shoulders/elbows, keeping forearms straight, as if pulling back on the reins of a horse. Squeeze shoulder blades together. Repeat _10-15__times, __2-3__sets/day    2) Shoulder Elevation    Sit up straight with arms by your sides. Slowly bring your shoulders up towards your ears. Repeat_10-15__times, __2-3__ sets/day   Scapular Retraction (Standing)   With arms at sides, pinch shoulder blades together. Repeat __10__ times per set.     (Home) Retraction: Row - Bilateral (Anchor)   Facing anchor, arms reaching forward, pull hands toward stomach, keeping elbows bent and at your sides and pinching shoulder blades together. Repeat 10-12 times. 1-2 times/day.   Copyright  V[employer]. All rights reserved.

## 2022-03-15 NOTE — Plan of Care (Signed)
  Problem: RH Dressing Goal: LTG Patient will perform lower body dressing w/assist (OT) Description: LTG: Patient will perform lower body dressing with assist, with/without cues in positioning using equipment (OT) Flowsheets (Taken 03/15/2022 1554) LTG: Pt will perform lower body dressing with assistance level of: (downgraded) Supervision/Verbal cueing Note: Downgraded 2/2 generalized weakness, balance and endurance as well as lack of carryover between sessions with the education provided   Problem: RH Toileting Goal: LTG Patient will perform toileting task (3/3 steps) with assistance level (OT) Description: LTG: Patient will perform toileting task (3/3 steps) with assistance level (OT)  Flowsheets (Taken 03/15/2022 1554) LTG: Pt will perform toileting task (3/3 steps) with assistance level: (downgraded) Supervision/Verbal cueing Note: Downgraded 2/2 generalized weakness, balance and endurance as well as lack of carryover between sessions with the education provided   Problem: RH Toilet Transfers Goal: LTG Patient will perform toilet transfers w/assist (OT) Description: LTG: Patient will perform toilet transfers with assist, with/without cues using equipment (OT) Flowsheets (Taken 03/15/2022 1554) LTG: Pt will perform toilet transfers with assistance level of: (downgraded) Supervision/Verbal cueing Note: Downgraded 2/2 generalized weakness, balance and endurance as well as lack of carryover between sessions with the education provided

## 2022-03-15 NOTE — Progress Notes (Signed)
Occupational Therapy Session Note  Patient Details  Name: Austin [name] MRN: [id number] Date of Birth: 10/03/1956  Today's Date: 03/15/2022 OT Individual Time: 1030-1130 OT Individual Time Calculation (min): 60 min    Short Term Goals: Week 1:  OT Short Term Goal 1 (Week 1): Pt will don pull up underwear with min A. OT Short Term Goal 2 (Week 1): Pt will don pants with min A. OT Short Term Goal 3 (Week 1): Pt will don DARCO shoe with min A. OT Short Term Goal 4 (Week 1): Pt will toilet with min A.  Skilled Therapeutic Interventions/Progress Updates:    S: Pt agreeable to participate in OT session.  O: - W/C mobility completed with patient's propelling himself in w/c from room to ortho gym and back to room with supervision while focusing on BUE strength and endurance. - Functional transfer: with set-up provided of for SB, pt performed a lateral scoot transfer with therapist only stabilizing board for safety. - Seated core exercises performed with use of 4.4 lb weighted ball to increase dynamic sitting balance when performing LB ADL tasks.  - Scapular strengthening: shoulder, red band, row, 12X - While supine, patient participated in static and dynamic stretches involving the hamstrings, piriformis  Patient education: HEP provided including A/ROM scapular exercises,scapular strengthening with red band (seated row). Patient provided with handout, verbal instructions, and visual demonstration. Patient verbalized and demonstrated understanding.     A: Session focused on BUE scapular and shoulder strengthening, core strength, functional transfers, and postural control education. Patient presents in a forward head, rounded shoulders, and kyphosis seated posture. At end of session, patient demonstrated improved seated posture with the ability to maintain for an extended amount of time. Patient was provided with physical assist to perform all supine stretches due to increased RLE weakness  and additional weight from [facility] shoe.     P: Continue to work on improving scapular strength and postural strength.    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

## 2022-03-15 NOTE — Progress Notes (Signed)
Physical Therapy Weekly Progress Note  Patient Details  Name: Austin [name] MRN: [id number] Date of Birth: [birth year]  Beginning of progress report period: March 09, 2022 End of progress report period: March 15, 2022  Today's Date: 03/15/2022 PT Individual Time: 5093-2671 PT Individual Time Calculation (min): 70 min   Patient has met 3 of 4 short term goals. Pt demonstrates gradual progress towards long term goals. Pt is currently able to perform bed mobility with supervision (using bed features) but can require min A for supine<>sit without bed features. Pt is currently able to perform slideboard transfers with CGA/close supervision, squat<>pivots with min A, and sit<>stands inside parallel bars with min A. Pt remains WB through R heel for transfers only wearing Darco shoe. Pt has been limited by increased pain/"soreness" along wound located at L tibial tuberosity, tightness in R hamstring preventing full knee extension in standing, generalized weakness/deconditioning, and decreased balance strategies. [employer] added soft leather patch to prosthetic to prevent rubbing of wound; however pt with little relief and now with increased difficulty getting prosthetic to "click in". Custom WC evaluation scheduled on Monday 6/12 with [name] - plan for ultra light WC.   Patient continues to demonstrate the following deficits muscle weakness and muscle joint tightness, decreased cardiorespiratoy endurance, and decreased sitting balance, decreased standing balance, decreased postural control, decreased balance strategies, and difficulty maintaining precautions and therefore will continue to benefit from skilled PT intervention to increase functional independence with mobility.  Patient progressing toward long term goals..  Continue plan of care.  PT Short Term Goals Week 1:  PT Short Term Goal 1 (Week 1): Pt will perform supine<>sit with supervision PT Short Term Goal 1 - Progress (Week 1): Met PT Short Term  Goal 2 (Week 1): Pt will perform bed<>chair transfers using LRAD simulating home set-up with CGA PT Short Term Goal 2 - Progress (Week 1): Met PT Short Term Goal 3 (Week 1): Pt will perform real-life car transfer with min assist PT Short Term Goal 3 - Progress (Week 1): Progressing toward goal PT Short Term Goal 4 (Week 1): Pt will demonstrate ability to set-up transfers managing w/c parts with only intermittent cuing PT Short Term Goal 4 - Progress (Week 1): Met Week 2:  PT Short Term Goal 1 (Week 2): STG=LTG due to LOS  Skilled Therapeutic Interventions/Progress Updates:  Ambulation/gait training;Community reintegration;DME/adaptive equipment instruction;Neuromuscular re-education;Psychosocial support;Stair training;UE/LE Strength taining/ROM;Wheelchair propulsion/positioning;Balance/vestibular training;Discharge planning;Functional electrical stimulation;Pain management;Skin care/wound management;Therapeutic Activities;UE/LE Coordination activities;Disease management/prevention;Functional mobility training;Patient/family education;Splinting/orthotics;Therapeutic Exercise;Visual/perceptual remediation/compensation   Today's Interventions: Received pt sitting in [facility] with family present at bedside. Pt agreeable to PT treatment and denied any pain during session. NT arrived to check vitals. Session with emphasis on functional mobility/transfers, generalized strengthening and endurance, dynamic standing balance/coordination, and WC mobility. Pt with new shrinker on L residual limb to wear in between therapies. Doffed shrinker and donned gel liner and prosthetic with min A but had to come into semi-stand with min A to click in. Pt performed WC mobility 142f using BUE and supervision to main therapy gym. Pt transferred sit<>stand inside // bars with min A x 5 reps - of note, pt with no c/o pain/"soreness" along tibial tuberosity today! Pt performed WC mobility 128fusing BUE and supervision to dayroom and  transferred WC<>mat squat<>pivot to R with min A and sit<>semi-reclined in wedge with supervision. Pt performed the following exercises while passively stretching R hamstring: -LLE SLR with 1.5lb ankle weight 2x15 -LLE hip abduction with 1.5lb ankle weight  2x15 Pt transferred supine<>R sidelying with supervision and performed LLE hip abduction 2x10 and LLE hip extension 2x10 - noted decreased ROM and required verbal/tactile cues for proper technique. Pt then transferred R sidelying<>supine<>L sidelying<>prone with CGA and cues for technique. Pt performed the following exercises in prone with emphasis on hip flexor stretching: -passive hip flexor stretch x 2 minutes  -active assisted hamstring curl on RLE 2x10 -active assist hip extension on LLE 2x10 -prone<>prone on elbows x10 with 3-5 second isometric hold Pt transferred prone<>supine<>sitting EOM with mod A due to trunk weakness. Pt then transferred mat<>WC squat<>pivot to L with min A without prosthetic on and transported back to room in WC dependently. Doffed prosthetic and gel liner and donned shrinker to L residual limb. Concluded session with pt sitting in WC, needs within reach, and seatbelt alarm on.   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

## 2022-03-15 NOTE — Progress Notes (Signed)
Occupational Therapy Session Note  Patient Details  Name: Austin [name] MRN: [id number] Date of Birth: Nov 26, 1955  Today's Date: 03/15/2022 OT Individual Time: 7106-2694 OT Individual Time Calculation (min): 55 min    Short Term Goals: Week 1:  OT Short Term Goal 1 (Week 1): Pt will don pull up underwear with min A. OT Short Term Goal 2 (Week 1): Pt will don pants with min A. OT Short Term Goal 3 (Week 1): Pt will don DARCO shoe with min A. OT Short Term Goal 4 (Week 1): Pt will toilet with min A.  Skilled Therapeutic Interventions/Progress Updates:  Skilled OT intervention completed with focus on ADL retraining, functional transfers. Pt received upright in bed, no c/o pain, agreeable to session. Completed bed mobility with supervision, then seated EOB participated in bathing/dressing. Able to bathe/dress UB with supervision, then bathe LB with supervision including perineal with heavy posterior/lateral leans. Sacral pad found to be folded away from skin, therefore therapist changed for skin integrity protection with sitting as preventative measure. Donned brief/pants with min A using lateral leans with pt preferring to almost lay down on each side vs clearing hip. Donned DRACO shoe with total A for time, then slideboard transfer > R with supervision to w/c. Pt actively pulling off his L leg bandage where sore is on tibial tuberosity, with therapist utilizing saline to prevent scab removal, and applied medi-honey and recovered per nursing recommendations for wound healing. Pt completed grooming with set up A. Discussed fitbit or other HR tracking device as pt with questions about therapist's watch and capabilities with education provided about fall alert benefits with it as well. Pt was left seated in w/c per pt request, belt alarm on and all needs in reach at end of session.   Therapy Documentation Precautions:  Precautions Precautions: Fall, Other (comment) Precaution Comments: H/o L BKA  (prosthetic in room) Other Brace: RLE darco shoe (in room) Restrictions Weight Bearing Restrictions: Yes RLE Weight Bearing: Touchdown weight bearing Other Position/Activity Restrictions: minimal WB through R heel for transfers only wearing DARCO shoe    Therapy/Group: Individual Therapy  Melvyn Novas, MS, OTR/L  03/15/2022, 7:49 AM

## 2022-03-15 NOTE — Progress Notes (Signed)
Occupational Therapy Weekly Progress Note  Patient Details  Name: Austin [name] MRN: [id number] Date of Birth: 02-May-1956  Beginning of progress report period: March 09, 2022 End of progress report period: March 16, 2022   Patient has met 2 of 4 short term goals.  Pt is making slow progress towards LTGs. Secondary to sore on L tibial tuberosity and pain from it, pt has transitioned functional transfers from mod A stand pivot using RW to supervision slideboard transfers/min A squat pivot for pain management and to maximize independence. Pt is able to bathe at an overall min A level, dress at an overall min A level and requires mod assist for toileting tasks. Pt continues to be demonstrate generalized weakness, endurance deficits, high pain levels and poor standing balance which all impact his self-care abilities. Therapist did downgraded goals to supervision for concern of safety as pt with poor insight sometimes. Family would benefit from education regarding how to maximize pt's safety and independence upon d/c.   Patient continues to demonstrate the following deficits: muscle weakness, decreased cardiorespiratoy endurance, decreased coordination, and decreased sitting balance, decreased standing balance, and difficulty maintaining precautions and therefore will continue to benefit from skilled OT intervention to enhance overall performance with BADL and Reduce care partner burden.  Patient progressing toward long term goals..  Plan of care revisions: downgraded pt's LTGs from Mod I to supervision.  OT Short Term Goals Week 1:  OT Short Term Goal 1 (Week 1): Pt will don pull up underwear with min A. OT Short Term Goal 1 - Progress (Week 1): Met OT Short Term Goal 2 (Week 1): Pt will don pants with min A. OT Short Term Goal 2 - Progress (Week 1): Met OT Short Term Goal 3 (Week 1): Pt will don DARCO shoe with min A. OT Short Term Goal 3 - Progress (Week 1): Not met OT Short Term Goal 4 (Week 1):  Pt will toilet with min A. OT Short Term Goal 4 - Progress (Week 1): Not met Week 2:  OT Short Term Goal 1 (Week 2): STG = LTG 2/2 ELOS   Austin Yera E Calaya Gildner, MS, OTR/L  03/15/2022, 4:01 PM

## 2022-03-15 NOTE — Plan of Care (Signed)
  Problem: Consults Goal: RH GENERAL PATIENT EDUCATION Description: See Patient Education module for education specifics. Outcome: Progressing Goal: Skin Care Protocol Initiated - if Braden Score 18 or less Description: If consults are not indicated, leave blank or document N/A Outcome: Progressing Goal: Diabetes Guidelines if Diabetic/Glucose > 140 Description: If diabetic or lab glucose is > 140 mg/dl - Initiate Diabetes/Hyperglycemia Guidelines & Document Interventions  Outcome: Progressing   Problem: RH SKIN INTEGRITY Goal: RH STG MAINTAIN SKIN INTEGRITY WITH ASSISTANCE Description: STG Maintain Skin Integrity With min Assistance. Outcome: Progressing Goal: RH STG ABLE TO PERFORM INCISION/WOUND CARE W/ASSISTANCE Description: STG Able To Perform Incision/Wound Care With min Assistance. Outcome: Progressing   Problem: RH SAFETY Goal: RH STG ADHERE TO SAFETY PRECAUTIONS W/ASSISTANCE/DEVICE Description: STG Adhere to Safety Precautions With Cues and Reminders. Outcome: Progressing Goal: RH STG DECREASED RISK OF FALL WITH ASSISTANCE Description: STG Decreased Risk of Fall With [employer]. Outcome: Progressing   Problem: RH KNOWLEDGE DEFICIT GENERAL Goal: RH STG INCREASE KNOWLEDGE OF SELF CARE AFTER HOSPITALIZATION Description: Patient will demonstrate knowledge of self-care management, medication management, skin/wound care, weight bearing precautions with educational materials and handouts provided by staff independently at discharge. Outcome: Progressing

## 2022-03-16 DIAGNOSIS — E669 Obesity, unspecified: Secondary | ICD-10-CM

## 2022-03-16 DIAGNOSIS — I739 Peripheral vascular disease, unspecified: Secondary | ICD-10-CM

## 2022-03-16 LAB — GLUCOSE, CAPILLARY
Glucose-Capillary: 142 mg/dL — ABNORMAL HIGH (ref 70–99)
Glucose-Capillary: 162 mg/dL — ABNORMAL HIGH (ref 70–99)
Glucose-Capillary: 244 mg/dL — ABNORMAL HIGH (ref 70–99)
Glucose-Capillary: 177 mg/dL — ABNORMAL HIGH (ref 70–99)

## 2022-03-16 NOTE — Progress Notes (Signed)
Physical Therapy Session Note  Patient Details  Name: Austin [name] MRN: [id number] Date of Birth: [birth year]  Today's Date: 03/16/2022 PT Individual Time: 0930-1025 PT Individual Time Calculation (min): 55 min   Short Term Goals: Week 2:  PT Short Term Goal 1 (Week 2): STG=LTG due to LOS  Skilled Therapeutic Interventions/Progress Updates:     Pt supine in bed to start in hospital gown. Agreeable to PT tx and denies pain. Supine<>sitting EOB with minA for trunk support, pt slow to move and movements are very effortful. Sitting balance unsupported with supervision. Donned t-shirt with setupA and minA for LB dressing for threading. Pt completing lateral leans at EOB to pull pants over hips. Donned DARCO shoe with totalA for time to RLE. SB transfer completed from EOB to w/c with setupA for board placement and w/c management - supervision for lateral scooting on board into chair. Propelled himself ~49f with supervision in w/c to ortho rehab gym, decreased stroke efficiency with limited B shldr extension during propulsions. Setup on UE ergometer to challenge UE strengthening and endurance training - completed 10 minutes while seated in w/c. Lateral scoot transfer from w/c to mat table with supervision. Supervision for sit>supine on mat table with wedge for trunk support. Completed supine and sidelying there-ex for BLE strengthening: -3 way hip 2x15 each direction in sidelying for LLE -hamstring stretch for RLE with active quad sets -SLR on RLE 2x10 with AAROM -Hip abd on RLE 2x10 with -SAQ on RLE 2x10 with bolster under knee Squat<>pivot transfer completed with setupA from mat table to w/c - propelled himself ~739fin w/c back to his room with supervision. Remained seated in w/c with safety belt alarm on, call bell in lap, all needs met.   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

## 2022-03-16 NOTE — Progress Notes (Signed)
PROGRESS NOTE   Subjective/Complaints: Austin [name] just waking up. Says pain is controlled. No new complaints  ROS: Patient denies fever, rash, sore throat, blurred vision, dizziness, nausea, vomiting, diarrhea, cough, shortness of breath or chest pain,   back/neck pain, headache, or mood change.    Objective:   No results found. No results for input(s): "WBC", "HGB", "HCT", "PLT" in the last 72 hours.  No results for input(s): "NA", "K", "CL", "CO2", "GLUCOSE", "BUN", "CREATININE", "CALCIUM" in the last 72 hours.   Intake/Output Summary (Last 24 hours) at 03/16/2022 0827 Last data filed at 03/16/2022 0800 Gross per 24 hour  Intake 692 ml  Output 1450 ml  Net -758 ml     Pressure Injury 03/08/22 Pretibial Left;Proximal Unstageable - Full thickness tissue loss in which the base of the injury is covered by slough (yellow, tan, gray, green or brown) and/or eschar (tan, brown or black) in the wound bed. Unstageable caused by (Active)  03/08/22 1515  Location: Pretibial  Location Orientation: Left;Proximal  Staging: Unstageable - Full thickness tissue loss in which the base of the injury is covered by slough (yellow, tan, gray, green or brown) and/or eschar (tan, brown or black) in the wound bed.  Wound Description (Comments): Unstageable caused by prothesis.  Present on Admission: Yes    Physical Exam: Vital Signs Blood pressure 131/84, pulse 74, temperature 97.9 F (36.6 C), temperature source Oral, resp. rate 17, height 6\' 4"  (1.93 m), weight 98.4 kg, SpO2 100 %. Gen: no distress, normal appearing, sitting in bed  Constitutional: No distress . Vital signs reviewed. HEENT: NCAT, EOMI, oral membranes moist Neck: supple Cardiovascular: RRR without murmur. No JVD    Respiratory/Chest: CTA Bilaterally without wheezes or rales. Normal effort    GI/Abdomen: BS +, non-tender, non-distended Ext: no clubbing, cyanosis, or edema Psych:  pleasant and cooperative  Skin:    General: Skin is warm and dry.     Comments: 1cm Dry scabbed ulcer on left tibial tuberosity. Right foot with dry dressing. Old Left 2nd digit amputation site well healed. Wearing DARCO show. --stable appearance Neurological:     Mental Status: He is alert and oriented to person, place, and time.    Assessment/Plan: 1. Functional deficits which require 3+ hours per day of interdisciplinary therapy in a comprehensive inpatient rehab setting. Physiatrist is providing close team supervision and 24 hour management of active medical problems listed below. Physiatrist and rehab team continue to assess barriers to discharge/monitor patient progress toward functional and medical goals  Care Tool:  Bathing    Body parts bathed by patient: Right arm, Left arm, Chest, Abdomen, Front perineal area, Right upper leg, Left upper leg, Right lower leg, Face, Buttocks     Body parts n/a: Left lower leg   Bathing assist Assist Level: Supervision/Verbal cueing     Upper Body Dressing/Undressing Upper body dressing   What is the patient wearing?: Pull over shirt    Upper body assist Assist Level: Set up assist    Lower Body Dressing/Undressing Lower body dressing      What is the patient wearing?: Incontinence brief, Pants     Lower body assist Assist for lower body  dressing: Minimal Assistance - Patient > 75%     Chartered loss adjuster assist Assist for toileting: Minimal Assistance - Patient > 75% Assistive Device Comment: bariatric BSC   Transfers Chair/bed transfer  Transfers assist     Chair/bed transfer assist level: Minimal Assistance - Patient > 75% (squat<>pivot)     Locomotion Ambulation   Ambulation assist   Ambulation activity did not occur: Safety/medical concerns          Walk 10 feet activity   Assist  Walk 10 feet activity did not occur: Safety/medical concerns        Walk 50 feet activity   Assist  Walk 50 feet with 2 turns activity did not occur: Safety/medical concerns         Walk 150 feet activity   Assist Walk 150 feet activity did not occur: Safety/medical concerns         Walk 10 feet on uneven surface  activity   Assist Walk 10 feet on uneven surfaces activity did not occur: Safety/medical concerns         Wheelchair     Assist Is the patient using a wheelchair?: Yes Type of Wheelchair: Manual    Wheelchair assist level: Supervision/Verbal cueing Max wheelchair distance: 128ft    Wheelchair 50 feet with 2 turns activity    Assist        Assist Level: Supervision/Verbal cueing   Wheelchair 150 feet activity     Assist      Assist Level: Supervision/Verbal cueing   Blood pressure 131/84, pulse 74, temperature 97.9 F (36.6 C), temperature source Oral, resp. rate 17, height 6\' 4"  (1.93 m), weight 98.4 kg, SpO2 100 %.  Medical Problem List and Plan: 1. Functional deficits secondary to right 5th digit amputation             -patient may shower             -ELOS/Goals: 10-14 days S           -Continue CIR therapies including Austin [name], OT  2.  H/o DVT: Continue eliquis             -antiplatelet therapy:  ASA/Plavix 3. Pain Management:  continue Oxycodone prn. Provided with pain relief journal 4. Mood: LCSW to follow for evaluation and support.              -antipsychotic agents: N/A 5. Neuropsych: This patient is capable of making decisions on his own behalf. 6. Skin/Wound Care:  Add protein supplement and vitamins to promote wound healing. 7. Fluids/Electrolytes/Nutrition: Monitor I/O. Check CMET in am             --Magnesium added to help with sleep, pain and BP 8. Osteomyelitis Right 5th toe s/p partial amputation: Augmentin with end date of 03/27/22  --follow up with ID after discharge.               --continue to monitor WBC--trending down.   -WBC was 10.2 on 6/5, continue to follow 9. CAD: Continue ASA, Plavix, Cozaar and  Lipitor. M--monitor for symptoms with increase in activity.   -Denies chest pain 10. T2DM: Hgb A1C- 11.8 (was 12.2 09/2021)--followed by PCP/nutritionist in [place]              --continue Novolog 10 units tid for meal coverage with SSI for elevated BS.   Discussed CBGs with patient.   Increase Lantus to 33U BID             --  reports compliance with insulin regimen/diet PTA.   -6/10 Lantus increased from 35u to 37 units to to elevated CBG--observe today  CBG (last 3)  Recent Labs    03/15/22 1642 03/15/22 2001 03/16/22 0635  GLUCAP 170* 241* 142*    11. 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  12.  A fib: Monitor HR/BP TID. Continue Eliquis.  13. Acute blood loss anemia:   -HGB stable at 13.5 on 6/5 14. Vitamin D deficiency: start ergocalciferol 50,000 U once per week for 7 weeks 15. Suboptimal magnesium level: started supplement  16. Screening for B12 deficiency: B12 reviewed and is normal at 746 on 02/16/22  LOS: 8 days A FACE TO [place] 03/16/2022, 8:27 AM

## 2022-03-17 LAB — GLUCOSE, CAPILLARY
Glucose-Capillary: 130 mg/dL — ABNORMAL HIGH (ref 70–99)
Glucose-Capillary: 139 mg/dL — ABNORMAL HIGH (ref 70–99)
Glucose-Capillary: 167 mg/dL — ABNORMAL HIGH (ref 70–99)
Glucose-Capillary: 193 mg/dL — ABNORMAL HIGH (ref 70–99)

## 2022-03-17 MED ORDER — INSULIN GLARGINE-YFGN 100 UNIT/ML ~~LOC~~ SOLN
39.0000 [IU] | Freq: Two times a day (BID) | SUBCUTANEOUS | Status: DC
  Administered 2022-03-17 – 2022-03-19 (×4): 39 [IU] via SUBCUTANEOUS
  Filled 2022-03-17 (×5): qty 0.39

## 2022-03-17 NOTE — Progress Notes (Addendum)
PROGRESS NOTE   Subjective/Complaints: Pt slept ok. No new complaints. Said that left BK prosthesis is fitting better after adjustments to socket  ROS: Patient denies fever, rash, sore throat, blurred vision, dizziness, nausea, vomiting, diarrhea, cough, shortness of breath or chest pain, joint or back/neck pain, headache, or mood change.   Objective:   No results found. No results for input(s): "WBC", "HGB", "HCT", "PLT" in the last 72 hours.  No results for input(s): "NA", "K", "CL", "CO2", "GLUCOSE", "BUN", "CREATININE", "CALCIUM" in the last 72 hours.   Intake/Output Summary (Last 24 hours) at 03/17/2022 0831 Last data filed at 03/17/2022 [id number] Gross per 24 hour  Intake 480 ml  Output 1600 ml  Net -1120 ml     Pressure Injury 03/08/22 Pretibial Left;Proximal Unstageable - Full thickness tissue loss in which the base of the injury is covered by slough (yellow, tan, gray, green or brown) and/or eschar (tan, brown or black) in the wound bed. Unstageable caused by (Active)  03/08/22 1515  Location: Pretibial  Location Orientation: Left;Proximal  Staging: Unstageable - Full thickness tissue loss in which the base of the injury is covered by slough (yellow, tan, gray, green or brown) and/or eschar (tan, brown or black) in the wound bed.  Wound Description (Comments): Unstageable caused by prothesis.  Present on Admission: Yes    Physical Exam: Vital Signs Blood pressure (!) 138/109, pulse 78, temperature 97.6 F (36.4 C), temperature source Oral, resp. rate 19, height 6\' 4"  (1.93 m), weight 98.4 kg, SpO2 100 %. Gen: no distress, normal appearing, sitting in bed  Constitutional: No distress . Vital signs reviewed. HEENT: NCAT, EOMI, oral membranes moist Neck: supple Cardiovascular: RRR without murmur. No JVD    Respiratory/Chest: CTA Bilaterally without wheezes or rales. Normal effort    GI/Abdomen: BS +, non-tender,  non-distended Ext: no clubbing, cyanosis, or edema Psych: pleasant and cooperative  Skin:    General: Skin is warm and dry.     Comments: 1cm Dry scabbed ulcer on left tibial tuberosity--foam dressing. Right foot with dry dressing. Old Left 2nd digit amputation site well healed. Wearing DARCO show. --stable appearance Neurological:     Mental Status: He is alert and oriented to person, place, and time.    Assessment/Plan: 1. Functional deficits which require 3+ hours per day of interdisciplinary therapy in a comprehensive inpatient rehab setting. Physiatrist is providing close team supervision and 24 hour management of active medical problems listed below. Physiatrist and rehab team continue to assess barriers to discharge/monitor patient progress toward functional and medical goals  Care Tool:  Bathing    Body parts bathed by patient: Right arm, Left arm, Chest, Abdomen, Front perineal area, Right upper leg, Left upper leg, Right lower leg, Face, Buttocks     Body parts n/a: Left lower leg   Bathing assist Assist Level: Supervision/Verbal cueing     Upper Body Dressing/Undressing Upper body dressing   What is the patient wearing?: Pull over shirt    Upper body assist Assist Level: Set up assist    Lower Body Dressing/Undressing Lower body dressing      What is the patient wearing?: Incontinence brief, Pants  Lower body assist Assist for lower body dressing: Minimal Assistance - Patient > 75%     Toileting Toileting    Toileting assist Assist for toileting: Minimal Assistance - Patient > 75% Assistive Device Comment: bariatric BSC   Transfers Chair/bed transfer  Transfers assist     Chair/bed transfer assist level: Minimal Assistance - Patient > 75% (squat<>pivot)     Locomotion Ambulation   Ambulation assist   Ambulation activity did not occur: Safety/medical concerns          Walk 10 feet activity   Assist  Walk 10 feet activity did not  occur: Safety/medical concerns        Walk 50 feet activity   Assist Walk 50 feet with 2 turns activity did not occur: Safety/medical concerns         Walk 150 feet activity   Assist Walk 150 feet activity did not occur: Safety/medical concerns         Walk 10 feet on uneven surface  activity   Assist Walk 10 feet on uneven surfaces activity did not occur: Safety/medical concerns         Wheelchair     Assist Is the patient using a wheelchair?: Yes Type of Wheelchair: Manual    Wheelchair assist level: Supervision/Verbal cueing Max wheelchair distance: 132ft    Wheelchair 50 feet with 2 turns activity    Assist        Assist Level: Supervision/Verbal cueing   Wheelchair 150 feet activity     Assist      Assist Level: Supervision/Verbal cueing   Blood pressure (!) 138/109, pulse 78, temperature 97.6 F (36.4 C), temperature source Oral, resp. rate 19, height 6\' 4"  (1.93 m), weight 98.4 kg, SpO2 100 %.  Medical Problem List and Plan: 1. Functional deficits secondary to right 5th digit amputation             -patient may shower             -ELOS/Goals: 10-14 days S           -Continue CIR therapies including PT, OT   2.  H/o DVT: Continue eliquis             -antiplatelet therapy:  ASA/Plavix 3. Pain Management:  continue Oxycodone prn. Provided with pain relief journal 4. Mood: LCSW to follow for evaluation and support.              -antipsychotic agents: N/A 5. Neuropsych: This patient is capable of making decisions on his own behalf. 6. Skin/Wound Care:  Add protein supplement and vitamins to promote wound healing. 7. Fluids/Electrolytes/Nutrition: Monitor I/O. Check CMET in am             --Magnesium added to help with sleep, pain and BP 8. Osteomyelitis Right 5th toe s/p partial amputation: Augmentin with end date of 03/27/22  --follow up with ID after discharge.               --continue to monitor WBC--trending down.   -WBC  was 10.2 on 6/5, continue to follow 9. CAD: Continue ASA, Plavix, Cozaar and Lipitor. M--monitor for symptoms with increase in activity.   -Denies chest pain 10. T2DM: Hgb A1C- 11.8 (was 12.2 09/2021)--followed by PCP/nutritionist in [place]              --continue Novolog 10 units tid for meal coverage with SSI for elevated BS.   Discussed CBGs with patient.   Increase Lantus to 33U  BID             --reports compliance with insulin regimen/diet PTA.   -6/11 Lantus increased from 35u to 37 units 6/9--increase to 39u today CBG (last 3)  Recent Labs    03/16/22 1653 03/16/22 2010 03/17/22 0613  GLUCAP 244* 177* 130*    11. 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   -6/11 socket adjusted for better limb contact. Still wearing foam pad over area of breakdown on tibia--monitor for fit/skin integrity 12.  A fib: Monitor HR/BP TID. Continue Eliquis.  13. Acute blood loss anemia:   -HGB stable at 13.5 on 6/5 14. Vitamin D deficiency: start ergocalciferol 50,000 U once per week for 7 weeks 15. Suboptimal magnesium level: started supplement  16. Screening for B12 deficiency: B12 reviewed and is normal at 746 on 02/16/22  LOS: 9 days A FACE TO [place] 03/17/2022, 8:31 AM

## 2022-03-18 DIAGNOSIS — M62838 Other muscle spasm: Secondary | ICD-10-CM

## 2022-03-18 LAB — GLUCOSE, CAPILLARY
Glucose-Capillary: 160 mg/dL — ABNORMAL HIGH (ref 70–99)
Glucose-Capillary: 125 mg/dL — ABNORMAL HIGH (ref 70–99)
Glucose-Capillary: 152 mg/dL — ABNORMAL HIGH (ref 70–99)
Glucose-Capillary: 334 mg/dL — ABNORMAL HIGH (ref 70–99)

## 2022-03-18 LAB — BASIC METABOLIC PANEL
Anion gap: 8 (ref 5–15)
BUN: 17 mg/dL (ref 8–23)
CO2: 25 mmol/L (ref 22–32)
Calcium: 9 mg/dL (ref 8.9–10.3)
Chloride: 107 mmol/L (ref 98–111)
Creatinine, Ser: 0.83 mg/dL (ref 0.61–1.24)
GFR, Estimated: >60 mL/min (ref >60)
Glucose, Bld: 165 mg/dL — ABNORMAL HIGH (ref 70–99)
Potassium: 3.8 mmol/L (ref 3.5–5.1)
Sodium: 140 mmol/L (ref 135–145)

## 2022-03-18 LAB — CBC
HCT: 35.5 % — ABNORMAL LOW (ref 39.0–52.0)
Hemoglobin: 11.7 g/dL — ABNORMAL LOW (ref 13.0–17.0)
MCH: 27.5 pg (ref 26.0–34.0)
MCHC: 33 g/dL (ref 30.0–36.0)
MCV: 83.3 fL (ref 80.0–100.0)
Platelets: 251 10*3/uL (ref 150–400)
RBC: 4.26 MIL/uL (ref 4.22–5.81)
RDW: 14.6 % (ref 11.5–15.5)
WBC: 9.3 10*3/uL (ref 4.0–10.5)
nRBC: 0 % (ref 0.0–0.2)

## 2022-03-18 MED ORDER — METHOCARBAMOL 500 MG PO TABS
500.0000 mg | ORAL_TABLET | Freq: Four times a day (QID) | ORAL | Status: DC | PRN
  Administered 2022-03-19 – 2022-03-21 (×6): 500 mg via ORAL
  Filled 2022-03-18 (×6): qty 1

## 2022-03-18 MED ORDER — METHOCARBAMOL 500 MG PO TABS: 500.0000 mg | ORAL_TABLET | Freq: Three times a day (TID) | ORAL | Status: DC | PRN

## 2022-03-18 NOTE — Plan of Care (Signed)
  Problem: Consults Goal: RH GENERAL PATIENT EDUCATION Description: See Patient Education module for education specifics. Outcome: Progressing Goal: Skin Care Protocol Initiated - if Braden Score 18 or less Description: If consults are not indicated, leave blank or document N/A Outcome: Progressing Goal: Diabetes Guidelines if Diabetic/Glucose > 140 Description: If diabetic or lab glucose is > 140 mg/dl - Initiate Diabetes/Hyperglycemia Guidelines & Document Interventions  Outcome: Progressing   Problem: RH SKIN INTEGRITY Goal: RH STG MAINTAIN SKIN INTEGRITY WITH ASSISTANCE Description: STG Maintain Skin Integrity With min Assistance. Outcome: Progressing Goal: RH STG ABLE TO PERFORM INCISION/WOUND CARE W/ASSISTANCE Description: STG Able To Perform Incision/Wound Care With min Assistance. Outcome: Progressing   Problem: RH SAFETY Goal: RH STG ADHERE TO SAFETY PRECAUTIONS W/ASSISTANCE/DEVICE Description: STG Adhere to Safety Precautions With Cues and Reminders. Outcome: Progressing Goal: RH STG DECREASED RISK OF FALL WITH ASSISTANCE Description: STG Decreased Risk of Fall With Min Assistance. Outcome: Progressing   Problem: RH KNOWLEDGE DEFICIT GENERAL Goal: RH STG INCREASE KNOWLEDGE OF SELF CARE AFTER HOSPITALIZATION Description: Patient will demonstrate knowledge of self-care management, medication management, skin/wound care, weight bearing precautions with educational materials and handouts provided by staff independently at discharge. Outcome: Progressing   

## 2022-03-18 NOTE — Discharge Summary (Signed)
Physician Discharge Summary  Patient ID: Austin [name] MRN: [id number] DOB/AGE: 1955/10/18 66 y.o.  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 ([place]) Active Problems:   Essential hypertension   Type 2 diabetes mellitus with diabetic peripheral angiopathy without gangrene ([place])   Acute osteomyelitis of right foot ([place])   Lower limb ulcer, calf ([facility])   Atrial fibrillation, chronic ([facility])   Acute blood loss anemia   Discharged Condition: stable  Significant Diagnostic Studies: N/A   Labs:  Basic Metabolic Panel:    Latest Ref Rng & Units 03/18/2022    5:13 AM 03/11/2022    5:51 AM 03/09/2022    5:25 AM  BMP  Glucose 70 - 99 mg/dL 165  219  218   BUN 8 - 23 mg/dL '17  13  10   ' Creatinine 0.61 - 1.24 mg/dL 0.83  0.89  0.88   Sodium 135 - 145 mmol/L 140  137  141   Potassium 3.5 - 5.1 mmol/L 3.8  4.2  4.2   Chloride 98 - 111 mmol/L 107  106  105   CO2 22 - 32 mmol/L '25  25  27   ' Calcium 8.9 - 10.3 mg/dL 9.0  9.0  9.3      CBC:    Latest Ref Rng & Units 03/18/2022    5:13 AM 03/11/2022    5:51 AM 03/09/2022    5:25 AM  CBC  WBC 4.0 - 10.5 K/uL 9.3  10.2  10.2   Hemoglobin 13.0 - 17.0 g/dL 11.7  13.5  12.4   Hematocrit 39.0 - 52.0 % 35.5  40.2  38.1   Platelets 150 - 400 K/uL 251  302  341      CBG: Recent Labs  Lab 03/20/22 1140 03/20/22 1633 03/20/22 2105 03/21/22 0600 03/21/22 1138  GLUCAP 153* 189* 199* 230* 148*    Brief HPI:   Austin [name] is a 66 y.o. male with history of T2DM-poorly controlled, CAD, DVT/CHF-on Eliquis, PAD with severe atherosclerotic changes BLE and chronic ulcers RLE, left-BKA who was admitted on 02/15/2022 with recurrent falls, fevers, leukocytosis and confusion.  He was noted to be septic due to osteomyelitis right fifth MT with bony destruction and diffuse edema.  He was started on broad-spectrum antibiotics and Dr. Cannon Kettle and  Dr. Carlis Abbott consulted for input as patient was not in favor of losing his leg.  He underwent aortogram with right external iliac artery angioplasty and stent placement on 05/17 followed by right CFA to tibioperoneal trunk bypass on 05/22.  He underwent partial fifth ray amputation of right foot by Dr. Amalia Hailey on 05/25 with recommendations to be minimally weightbearing on RLE for transfers only.  Wound cultures were positive for rare group B strep agalactiae and rare Staph aureus therefore antibiotics narrowed to Augmentin with recommendations of 4 weeks antibiotic treatment per ID.  Antibiotic end date 06/21 and patient follow-up with ID after discharge.  PT and OT has been working with patient who continues to be limited by weakness and weightbearing precautions.  CIR was recommended due to functional decline.   Hospital Course: Austin [name] was admitted to rehab 03/08/2022 for inpatient therapies to consist of PT and OT at least three hours five days a week. Past admission physiatrist, therapy team and rehab RN have worked together to provide customized collaborative inpatient rehab.   Blood pressures were monitored on  TID basis and   Diabetes has been monitored with ac/hs CBG checks and SSI was use prn for tighter BS control.    Rehab course: During patient's stay in rehab weekly team conferences were held to monitor patient's progress, set goals and discuss barriers to discharge. At admission, patient required mod assist with ADL tasks and min assist with mobility at wheelchair level. He  has had improvement in activity tolerance, balance, postural control as well as ability to compensate for deficits.     Disposition:   Diet: Carb Modified.   Special Instructions: Minimal weight of right foot with [place] shoe.    Discharge Instructions     Ambulatory referral to Physical Medicine Rehab   Complete by: As directed    [place] or Yuri for hospital follow up      Allergies as of 03/21/2022    No Known Allergies      Medication List     STOP taking these medications    insulin glargine-yfgn 100 UNIT/ML injection Commonly known as: SEMGLEE   traZODone 50 MG tablet Commonly known as: DESYREL       TAKE these medications    Accu-Chek Guide test strip Generic drug: glucose blood 5 (five) times daily.   Accu-Chek Guide w/Device Kit 5 (five) times daily.   acidophilus Caps capsule Take 1 capsule by mouth daily after supper.   amoxicillin-clavulanate 875-125 MG tablet Commonly known as: AUGMENTIN Take 1 tablet by mouth 2 (two) times daily for 20 days.   apixaban 5 MG Tabs tablet Commonly known as: ELIQUIS Take 1 tablet (5 mg total) by mouth 2 (two) times daily. (Take 1 tablet (5 mg total) by mouth 2 (two) times daily.)   ascorbic acid 500 MG tablet Commonly known as: VITAMIN C Take 1 tablet (500 mg total) by mouth 2 (two) times daily.   aspirin EC 81 MG tablet Take 1 tablet (81 mg total) by mouth daily.   atorvastatin 80 MG tablet Commonly known as: LIPITOR Take 1 tablet (80 mg total) by mouth daily at 6 PM.   clopidogrel 75 MG tablet Commonly known as: PLAVIX Take 1 tablet (75 mg total) by mouth daily at 6 (six) AM.   insulin aspart 100 UNIT/ML injection Commonly known as: novoLOG Inject 13 Units into the skin 3 (three) times daily with meals. What changed:  how much to take Another medication with the same name was removed. Continue taking this medication, and follow the directions you see here.   insulin glargine 100 UNIT/ML Solostar Pen Commonly known as: LANTUS Inject 41 Units into the skin 2 (two) times daily.   leptospermum manuka honey Pste paste Apply 1 Application topically daily.   losartan 25 MG tablet Commonly known as: COZAAR Take 1 tablet (25 mg total) by mouth daily.   magnesium oxide 400 MG tablet Commonly known as: MAG-OX Take 0.5 tablets (200 mg total) by mouth daily.   methocarbamol 500 MG tablet Commonly known  as: ROBAXIN Take 1 tablet (500 mg total) by mouth every 6 (six) hours as needed for muscle spasms.   oxyCODONE 5 MG immediate release tablet Commonly known as: Oxy IR/ROXICODONE Take 1 tablet (5 mg total) by mouth 2 (two) times daily as needed for severe pain. What changed:  when to take this reasons to take this   pantoprazole 40 MG tablet Commonly known as: PROTONIX Take 1 tablet (40 mg total) by mouth daily.   polyethylene glycol powder 17 GM/SCOOP powder Commonly known as: GLYCOLAX/MIRALAX  Take 17 g by mouth daily.   TRUEplus 5-Bevel Pen Needles 31G X 6 MM Misc Generic drug: Insulin Pen Needle What changed: Another medication with the same name was added. Make sure you understand how and when to take each.   Insulin Pen Needle 32G X 4 MM Misc Apply 1 application 4 (four) times daily -  before meals and at bedtime. What changed: You were already taking a medication with the same name, and this prescription was added. Make sure you understand how and when to take each.   Vitamin D (Ergocalciferol) 1.25 MG (50000 UNIT) Caps capsule Commonly known as: DRISDOL Take 1 capsule (50,000 Units total) by mouth every 7 (seven) days. Start taking on: March 23, 2022   Zinc Sulfate 220 (50 Zn) MG Tabs Take 1 tablet (220 mg total) by mouth daily after supper.        Follow-up Information     Edrick Kins, DPM Follow up.   Specialty: Podiatry Contact information: [place] [address] [phone]         Marty Heck, MD Follow up.   Specialty: Vascular Surgery Contact information: [place] [address] [phone]         Izora Ribas, MD Follow up.   Specialty: Physical Medicine and Rehabilitation Contact information: [address] [place] [address] [phone]         Theotis Burrow, MD Follow up on 03/27/2022.   Specialty: Family Medicine Why: Be there at 10:20 am for hospital follow  up Contact information: [address] [place] [address] [phone]         Rosiland Oz, MD Follow up on 03/29/2022.   Specialty: Infectious Diseases Why: Be there at 10 am for appointment at 10:30 am Contact information: [address] [place] [address] [phone]                 Signed: Bary Leriche 03/21/2022, 12:20 PM

## 2022-03-18 NOTE — Progress Notes (Signed)
Occupational Therapy Session Note  Patient Details  Name: Austin [name] MRN: [id number] Date of Birth: 30-Nov-1955  Today's Date: 03/18/2022 OT Individual Time: 1005-1100 OT Individual Time Calculation (min): 55 min    Short Term Goals: Week 2:  OT Short Term Goal 1 (Week 2): STG = LTG 2/2 ELOS  Skilled Therapeutic Interventions/Progress Updates:  Skilled OT intervention completed with focus on tub/shower transfers/education, toilet transfers/pants management, BUE strengthening. Pt received seated in w/c, 5/10 pain in L residual limb, pt pre-medicated, with therapist offering modifications and repositioning for pain reduction. Pt self-propelled in w/c with supervision from room <> ADL bathroom using BUE. Education provided on DME available such as tub bench that can be purchased for use at home (reports he has one already), suggested use of [facility] for ease of bathing, shower curtain management to prevent water spillage, minimizing stands via lateral leans for pericare, use of grab bars/installation as well as effective safety strategies for exiting the shower to eliminate falls. Completed lateral scoot/squat pivot from w/c > R with min A to tub bench, with pt requiring safety and positioning cues throughout as pt with poor safety awareness about hip placement. Education provided about grab bar placement for optimal safety as pt frequently trying to use w/c wheel to push up with therapist having to stabilize chair from shifting. Tub bench did have slight incline, with therapist educating on how to set up bench at home to increase efficiency with sliding. Practice squat pivot to toilet in room, with pt demonstrating min A level squat pivot <> using grab bars however with difficulty clearing hips enough to actual squat pivot vs series of lateral scoots. Discussed lack of safety with a standard BSC that pt has at home, with recommendation of drop arm BBSC as better alternative for safety reasons as  increased surface area allows sliding safety 2/2 inability to clear hips enough. Pt demonstrated lateral leans for doffing pants with supervision, however increased challenge with this and pt stating "I get into my w/c to lean back up against a wall to take mine off before getting to the commode." Pt demonstrated his technique in w/c, with very poor safety with technique of reclining the w/c back to put pants on with therapist strongly advising against this method vs lateral leans. Advised pt's primary PT to ensure anti-tippers on pt's custom w/c during w/c eval today to prevent falls. To promote BUE strengthening needed for functional transfers and ADL tasks, pt completed the following seated in w/c:  (With red theraband) x12 reps Horizontal abduction Self-anchored shoulder flexion each arm Self-anchored bicep flexion each arm Alternating chest presses Shoulder external rotation Shoulder diagonal pulls Therapist anchored scapular retraction  Multimodal cues needed for form. Pt was left seated in w/c, with belt alarm on and all needs in reach at end of session.  Therapy Documentation Precautions:  Precautions Precautions: Fall, Other (comment) Precaution Comments: H/o L BKA (prosthetic in room) Other Brace: RLE darco shoe (in room) Restrictions Weight Bearing Restrictions: Yes RLE Weight Bearing: Touchdown weight bearing Other Position/Activity Restrictions: minimal WB through R heel for transfers only wearing DARCO shoe    Therapy/Group: Individual Therapy  Blase Mess, MS, OTR/L  03/18/2022, 7:40 AM

## 2022-03-18 NOTE — Progress Notes (Signed)
Physical Therapy Session Note  Patient Details  Name: Austin [name] MRN: [id number] Date of Birth: 1956-01-18  Today's Date: 03/18/2022 PT Individual Time: 5638-7564 and 3329-5188 PT Individual Time Calculation (min): 41 min 98 min  Short Term Goals: Week 1:  PT Short Term Goal 1 (Week 1): Pt will perform supine<>sit with supervision PT Short Term Goal 1 - Progress (Week 1): Met PT Short Term Goal 2 (Week 1): Pt will perform bed<>chair transfers using LRAD simulating home set-up with CGA PT Short Term Goal 2 - Progress (Week 1): Met PT Short Term Goal 3 (Week 1): Pt will perform real-life car transfer with min assist PT Short Term Goal 3 - Progress (Week 1): Progressing toward goal PT Short Term Goal 4 (Week 1): Pt will demonstrate ability to set-up transfers managing w/c parts with only intermittent cuing PT Short Term Goal 4 - Progress (Week 1): Met Week 2:  PT Short Term Goal 1 (Week 2): STG=LTG due to LOS  Skilled Therapeutic Interventions/Progress Updates:   Treatment Session 1 Received pt semi-reclined in bed asleep. Upon wakening pt agreeable to PT treatment and denied any pain during session. Session with emphasis on dressing, functional mobility/transfers, generalized strengthening and endurance. Pt transferred semi-reclined<>sitting EOB with HOB elevated and use of bedrails with supervision but then realized to maximize independence with dressing he needed to lie back down - transferred sit<>supine with supervision. Pt removed dirty brief and donned clean one with mod A to thread RLE through. In sitting/semi-reclined, pulled brief/pants over hips via lateral leans with increased time, but required mod HHA to return to sitting upright each time due to poor core strength. Doffed dirty gown and donned pull over shirt sitting EOB with supervision and R Darco shoe with max A. Pt transferred bed<>WC squat<>pivot to R with heavy min/mod A - pt leaning posteriorly and did not have L  prosthetic donned. Pt sat in [facility] and brushed teeth/washed face with set up assist. Pt then performed seated passive R hamstring stretch for 4 minutes with foot elevated on trashcan and gentle overpressure, then transitioned to RLE knee extensions 2x15 - continues to demonstrate decreased ROM due to hamstring tightness, but has improved since eval. Discussed upcoming D/C and pt reported feeling prepared for D/C and stated that his friend, Austin [name], will be available to provide 24/7 assist - would benefit from family education prior to discharge - notified CSW. Concluded session with pt sitting in WC, needs within reach, and seatbelt alarm on.  Treatment Session 2 Received pt sitting in WC finishing lunch. Pt agreeable to PT treatment and denied any pain during session. [name] from [employer] present for wheelchair evaluation. Session with emphasis on functional mobility/transfers, generalized strengthening and endurance, and dynamic standing balance/coordination. Pt transported to therapy gym in [facility] dependently for time management purposes. Pt transferred WC<>mat via squat<>pivot with CGA and donned gel liner and prosthetic with max A for time management purposes. Then RN arrived to change dressings - removed prosthetic and liner. Due to pt receiving WC 2 years prior and plan to return to ambulation in the future, pt did not qualify for K5 ultralight custom wheelchair. [employer] fixed loose brakes and broken armrest on pt's personal WC and while [employer] made adjustements, pt practiced donning/doffing gel liner and prosthetic with supervision and min cues. Pt then transferred mat<>personal WC squat<>pivot with CGA and performed WC mobility 167f using BUE and supervision to ortho gym. Pt performed seated BUE strengthening on UBE on level 4 for 3 minutes  forward and 3 minutes backwards. Pt then performed simulated car transfer without AD and mod A to enter car and CGA to exit with cues for technique. Retrieved slideboard and  performed again - this time with supervision including placing/removing board. Discussed using slideboard for improved independence and safety and pt in agreement - notified CSW to place order. Pt transferred on/off mat via squat<>pivot with CGA and worked on sit<>stands with RW and min A x 4 reps from elevated EOM using mirror for visual feedback - limited by increased pain along L tibial tuberosity wound. Pt required cues for 1 hand placement on RW and 1 on mat instead of pulling up with BUE on RW. Pt transferred sit<>supine on wedge with supervision and increased time/effort. Pt performed the following exercises with supervision and verbal cues for technique with emphasis on LE strength/ROM and hip flexor/hamstring stretching: -LLE SLR 2x12 -LLE hip abduction 2x12 -RLE active assisted heel slides 2x10 Pt transferred supine<>sidelying<>prone with min A for RLE management and performed the following exercises with emphasis on hip flexor and R hamstring stretching: -R hamstring curls 2x10 AAROM -prone<>prone on elbows 10x5 second isometric hold  Pt transferred prone<>sidelying<>sitting EOM with mod A for trunk control. Returned to room and friend, Austin [name], present at bedside. Discussed having Austin [name] come in tomorrow for family education training. Concluded session with pt sitting in WC, needs within reach, and seatbelt alarm on.   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

## 2022-03-18 NOTE — Progress Notes (Signed)
PROGRESS NOTE   Subjective/Complaints: Reports some muscle spasm in his L leg. No additional concerns.   ROS: No fever, chills, SOB, HA, CP  Objective:   No results found. Recent Labs    03/18/22 0513  WBC 9.3  HGB 11.7*  HCT 35.5*  PLT 251    Recent Labs    03/18/22 0513  NA 140  K 3.8  CL 107  CO2 25  GLUCOSE 165*  BUN 17  CREATININE 0.83  CALCIUM 9.0     Intake/Output Summary (Last 24 hours) at 03/18/2022 0746 Last data filed at 03/18/2022 0700 Gross per 24 hour  Intake 1410 ml  Output 2100 ml  Net -690 ml      Pressure Injury 03/08/22 Pretibial Left;Proximal Unstageable - Full thickness tissue loss in which the base of the injury is covered by slough (yellow, tan, gray, green or brown) and/or eschar (tan, brown or black) in the wound bed. Unstageable caused by (Active)  03/08/22 1515  Location: Pretibial  Location Orientation: Left;Proximal  Staging: Unstageable - Full thickness tissue loss in which the base of the injury is covered by slough (yellow, tan, gray, green or brown) and/or eschar (tan, brown or black) in the wound bed.  Wound Description (Comments): Unstageable caused by prothesis.  Present on Admission: Yes    Physical Exam: Vital Signs Blood pressure 139/78, pulse 86, temperature 98.2 F (36.8 C), temperature source Oral, resp. rate 18, height 6\' 4"  (1.93 m), weight 98.4 kg, SpO2 98 %. Gen: no distress, normal appearing, sitting in bed  Constitutional: No distress . Vital signs reviewed. HEENT: NCAT, EOMI, oral membranes moist, conjugate gaze Neck: supple Cardiovascular: RRR without murmur. No JVD    Respiratory/Chest: CTA Bilaterally without wheezes or rales. Normal effort    GI/Abdomen: BS +, non-tender, non-distended Ext: no clubbing, cyanosis, or edema Psych: pleasant and cooperative  Skin:    General: Skin is warm and dry.     Comments: 1cm Dry scabbed ulcer on left tibial  tuberosity--foam dressing. Right foot with dry dressing. Old Left 2nd digit amputation site well healed. Wearing DARCO show. --stable appearance Neurological:     Mental Status: He is alert and oriented to person, place, and time.    Assessment/Plan: 1. Functional deficits which require 3+ hours per day of interdisciplinary therapy in a comprehensive inpatient rehab setting. Physiatrist is providing close team supervision and 24 hour management of active medical problems listed below. Physiatrist and rehab team continue to assess barriers to discharge/monitor patient progress toward functional and medical goals  Care Tool:  Bathing    Body parts bathed by patient: Right arm, Left arm, Chest, Abdomen, Front perineal area, Right upper leg, Left upper leg, Right lower leg, Face, Buttocks     Body parts n/a: Left lower leg   Bathing assist Assist Level: Supervision/Verbal cueing     Upper Body Dressing/Undressing Upper body dressing   What is the patient wearing?: Pull over shirt    Upper body assist Assist Level: Set up assist    Lower Body Dressing/Undressing Lower body dressing      What is the patient wearing?: Incontinence brief, Pants     Lower  body assist Assist for lower body dressing: Minimal Assistance - Patient > 75%     Toileting Toileting    Toileting assist Assist for toileting: Minimal Assistance - Patient > 75% Assistive Device Comment: bariatric BSC   Transfers Chair/bed transfer  Transfers assist     Chair/bed transfer assist level: Minimal Assistance - Patient > 75% (squat<>pivot)     Locomotion Ambulation   Ambulation assist   Ambulation activity did not occur: Safety/medical concerns          Walk 10 feet activity   Assist  Walk 10 feet activity did not occur: Safety/medical concerns        Walk 50 feet activity   Assist Walk 50 feet with 2 turns activity did not occur: Safety/medical concerns         Walk 150 feet  activity   Assist Walk 150 feet activity did not occur: Safety/medical concerns         Walk 10 feet on uneven surface  activity   Assist Walk 10 feet on uneven surfaces activity did not occur: Safety/medical concerns         Wheelchair     Assist Is the patient using a wheelchair?: Yes Type of Wheelchair: Manual    Wheelchair assist level: Supervision/Verbal cueing Max wheelchair distance: 122ft    Wheelchair 50 feet with 2 turns activity    Assist        Assist Level: Supervision/Verbal cueing   Wheelchair 150 feet activity     Assist      Assist Level: Supervision/Verbal cueing   Blood pressure 139/78, pulse 86, temperature 98.2 F (36.8 C), temperature source Oral, resp. rate 18, height 6\' 4"  (1.93 m), weight 98.4 kg, SpO2 98 %.  Medical Problem List and Plan: 1. 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  H/o DVT: Continue eliquis             -antiplatelet therapy:  ASA/Plavix 3. Pain Management:  continue Oxycodone prn. Provided with pain relief journal 4. Mood: LCSW to follow for evaluation and support.              -antipsychotic agents: N/A 5. Neuropsych: This patient is capable of making decisions on his own behalf. 6. Skin/Wound Care:  Add protein supplement and vitamins to promote wound healing. 7. Fluids/Electrolytes/Nutrition: Monitor I/O. Check CMET in am             --Magnesium added to help with sleep, pain and BP 8. Osteomyelitis Right 5th toe s/p partial amputation: Augmentin with end date of 03/27/22  --follow up with ID after discharge.               --continue to monitor WBC--trending down.   -WBC was 10.2 on 6/5, continue to follow 9. CAD: Continue ASA, Plavix, Cozaar and Lipitor. M--monitor for symptoms with increase in activity.    -Denies chest pain 10. T2DM: Hgb A1C- 11.8 (was 12.2 09/2021)--followed by PCP/nutritionist in [place]              --continue Novolog 10 units tid for meal coverage with SSI for elevated BS.   Discussed CBGs with patient.  Increase Lantus to 33U BID             --reports compliance with insulin regimen/diet PTA.   -6/11 Lantus increased from 35u to 37 units 6/9--increase to 39u today  -6/12 Glucose with fair control, continue to monitor CBG (last 3)  Recent Labs    03/17/22 1707 03/17/22 2034 03/18/22 0533  GLUCAP 167* 193* 160*     11. 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   -6/11 socket adjusted for better limb contact. Still wearing foam pad over area of breakdown on tibia--monitor for fit/skin integrity 12.  A fib: Monitor HR/BP TID. Continue Eliquis.  13. Acute blood loss anemia:   -6/12 HGB 11.7 overall stable 14. Vitamin D deficiency: start ergocalciferol 50,000 U once per week for 7 weeks 15. Suboptimal magnesium level: started supplement  16. Screening for B12 deficiency: B12 reviewed and is normal at 746 on 02/16/22 17. Muscle spasms  -Start robaxin prn  LOS: 10 days A FACE TO FACE EVALUATION WAS PERFORMED  Jennye Boroughs 03/18/2022, 7:46 AM

## 2022-03-18 NOTE — Discharge Instructions (Addendum)
Inpatient Rehab Discharge Instructions  Austin [name] Discharge date and time:    Activities/Precautions/ Functional Status: Activity: no lifting, driving, or strenuous exercise till cleared by MD Diet: diabetic diet  Wound Care: Contact Dr. Logan Bores for questions/problems with foot and contact Dr. Chestine Spore for any issues with right groin wound.  Foot: Clean right foot incision with saline, dry well.  Apply Xeroform followed by 4 x 4's, Kerlix, Ace.  Make sure to pad all bony prominences. Change daily. Area on shin: Cleanse with soap and water, apply a layer of manuka honey and cover with dry dressing. Change daily.  3. Right groin: Wash with soap and water, pat dry. Place dry gauze to right groin to wick moisture to help prevent wound infection.  This does not need to be taped in place.  Please replace daily and as needed.     Functional status:  ___ No restrictions     ___ Walk up steps independently _X__ 24/7 supervision/assistance   ___ Walk up steps with assistance ___ Intermittent supervision/assistance  ___ Bathe/dress independently ___ Walk with walker     __X_ Bathe/dress with assistance ___ Walk Independently    ___ Shower independently ___ Walk with assistance    ___ Shower with assistance _X__ No alcohol     ___ Return to work/school ________   COMMUNITY REFERRALS UPON DISCHARGE:    Home Health:   PT     OT     RN                   Agency: [facility] Phone: [phone]    Medical Equipment/Items Ordered: Hospital Bed, Bariatric drop arm BSC, Slide Board                                                  Agency/Supplier: [employer] [phone]  Special Instructions: Monitor blood sugars before meals and at bedtime. More frequently on your libre Touch down/minimal weight on right foot.    My questions have been answered and I understand these instructions. I will adhere to these goals and the provided educational materials after my discharge from the  hospital.  Patient/Caregiver Signature _______________________________ Date __________  Clinician Signature _______________________________________ Date __________  Please bring this form and your medication list with you to all your follow-up doctor's appointments.

## 2022-03-19 LAB — GLUCOSE, CAPILLARY
Glucose-Capillary: 193 mg/dL — ABNORMAL HIGH (ref 70–99)
Glucose-Capillary: 139 mg/dL — ABNORMAL HIGH (ref 70–99)
Glucose-Capillary: 151 mg/dL — ABNORMAL HIGH (ref 70–99)
Glucose-Capillary: 190 mg/dL — ABNORMAL HIGH (ref 70–99)

## 2022-03-19 MED ORDER — INSULIN GLARGINE-YFGN 100 UNIT/ML ~~LOC~~ SOLN
41.0000 [IU] | Freq: Two times a day (BID) | SUBCUTANEOUS | Status: DC
  Administered 2022-03-19 – 2022-03-21 (×4): 41 [IU] via SUBCUTANEOUS
  Filled 2022-03-19 (×6): qty 0.41

## 2022-03-19 NOTE — Progress Notes (Signed)
Patient ID: Austin [name], male   DOB: 1956-05-05, 66 y.o.   MRN: [id number]  Hospital Bed ordered through [employer] per therapy request

## 2022-03-19 NOTE — Progress Notes (Signed)
Occupational Therapy Session Note  Patient Details  Name: Austin [name] MRN: [id number] Date of Birth: 06-20-1956  Today's Date: 03/19/2022 OT Individual Time: 1101-1156 OT Individual Time Calculation (min): 55 min    Short Term Goals: Week 2:  OT Short Term Goal 1 (Week 2): STG = LTG 2/2 ELOS  Skilled Therapeutic Interventions/Progress Updates:  Skilled OT intervention completed with focus on w/c management for better bilateral hand grip with w/c mobility, w/c mobility practice for prep for home management tasks, BUE/cardiovascular endurance. Pt received seated in w/c, unrated pain in L tibial tuberosity, pre-medicated, with therapist offering w/c level tasks vs standing for pain management. Self-propelled in w/c > ortho gym with mod I, with pt c/o lack of grip with controlling w/c rims during mobility. Therapist applied coban to bilateral w/c rims for increased grip and efficiency with self-propulsion needed for home management. During application, pt completed the following intervals while seated at [facility] for endurance of the BUE/cardiovascular system needed for ADL tasks:  5 mins forwards, level 8 4 mins forward, level 8 5 mins backward, level 8 4 mins backward, level 8  To promote effectiveness and navigation with w/c mobility, pt completed 10 cone obstacle course while self-propelling with cues needed for navigating tighter cones with education provided about taking wider angle for narrower spaces like doorways. Able to complete with supervision. Dependently transported in w/c back to room 2/2 report of low back pain. Completed slide board transfer to recliner from w/c with supervision, then therapist applied hot pack to low back for pain reduction. Pt was left upright in recliner, with belt alarm on and all needs in reach at end of session.   Therapy Documentation Precautions:  Precautions Precautions: Fall, Other (comment) Precaution Comments: L BKA (has prosthetic) Required  Braces or Orthoses: Other Brace Other Brace: RLE Darco shoe Restrictions Weight Bearing Restrictions: Yes RLE Weight Bearing: Weight bearing as tolerated Other Position/Activity Restrictions: minimal WB through R heel for transfers only wearing DARCO shoe    Therapy/Group: Individual Therapy  Melvyn Novas, MS, OTR/L  03/19/2022, 7:31 AM

## 2022-03-19 NOTE — Plan of Care (Signed)
  Problem: Consults Goal: RH GENERAL PATIENT EDUCATION Description: See Patient Education module for education specifics. Outcome: Progressing Goal: Skin Care Protocol Initiated - if Braden Score 18 or less Description: If consults are not indicated, leave blank or document N/A Outcome: Progressing Goal: Diabetes Guidelines if Diabetic/Glucose > 140 Description: If diabetic or lab glucose is > 140 mg/dl - Initiate Diabetes/Hyperglycemia Guidelines & Document Interventions  Outcome: Progressing   Problem: RH SKIN INTEGRITY Goal: RH STG MAINTAIN SKIN INTEGRITY WITH ASSISTANCE Description: STG Maintain Skin Integrity With min Assistance. Outcome: Progressing Goal: RH STG ABLE TO PERFORM INCISION/WOUND CARE W/ASSISTANCE Description: STG Able To Perform Incision/Wound Care With min Assistance. Outcome: Progressing   Problem: RH SAFETY Goal: RH STG ADHERE TO SAFETY PRECAUTIONS W/ASSISTANCE/DEVICE Description: STG Adhere to Safety Precautions With Cues and Reminders. Outcome: Progressing Goal: RH STG DECREASED RISK OF FALL WITH ASSISTANCE Description: STG Decreased Risk of Fall With Min Assistance. Outcome: Progressing   Problem: RH KNOWLEDGE DEFICIT GENERAL Goal: RH STG INCREASE KNOWLEDGE OF SELF CARE AFTER HOSPITALIZATION Description: Patient will demonstrate knowledge of self-care management, medication management, skin/wound care, weight bearing precautions with educational materials and handouts provided by staff independently at discharge. Outcome: Progressing   

## 2022-03-19 NOTE — Progress Notes (Signed)
Physical Therapy Session Note  Patient Details  Name: Austin [name] MRN: [id number] Date of Birth: 07-20-1956  Today's Date: 03/19/2022 PT Individual Time: 0093-8182 and 9937-1696 PT Individual Time Calculation (min): 71 min and 54 min  Short Term Goals: Week 1:  PT Short Term Goal 1 (Week 1): Pt will perform supine<>sit with supervision PT Short Term Goal 1 - Progress (Week 1): Met PT Short Term Goal 2 (Week 1): Pt will perform bed<>chair transfers using LRAD simulating home set-up with CGA PT Short Term Goal 2 - Progress (Week 1): Met PT Short Term Goal 3 (Week 1): Pt will perform real-life car transfer with min assist PT Short Term Goal 3 - Progress (Week 1): Progressing toward goal PT Short Term Goal 4 (Week 1): Pt will demonstrate ability to set-up transfers managing w/c parts with only intermittent cuing PT Short Term Goal 4 - Progress (Week 1): Met Week 2:  PT Short Term Goal 1 (Week 2): STG=LTG due to LOS  Skilled Therapeutic Interventions/Progress Updates:   Treatment Session 1 Arrived late to session due to nursing staff emergency. Received pt semi-reclined in bed asleep. Upon wakening, pt agreeable to PT treatment and reported pain 8/10 in back and along L tibial tuberosity wound, pt also requesting muscle relaxer - RN notified and present to administer medications. Session with emphasis on dressing, functional mobility/transfers, generalized strengthening and endurance, and toileting. Doffed dirty underwear and donned clean ones and pants with mod A to thread RLE through - rolled L/R with supervision and heavy reliance on bedrails to pull underwear/pants over hips - pt required increased time and effort and needed rest break afterwards. Discussed hospital bed, but pt reports he doesn't know if one will fit in Austin [name]'s home - will discuss with Austin [name] later today if she is present. Pt transferred semi-reclined<>sitting EOB with HOB slightly elevated and heavy use of bedrails with  supervision and increased time/effort. Doffed dirty shirt and donned clean one with supervision. Donned R Darco shoe with total A and required x 2 attempts to don gel liner/prosthetic with min A and cues to ensure liner is flush against residual limb. Pt transferred bed<>WC squat<>pivot to L with CGA/min A and sat in WC at sink and brushed teeth/washed face with set up assist. Pt unable to tolerate wearing prosthetic during session due to pressure/rubbing on wound - therefore deferred standing and removed liner and prosthetic and donned shrinker. Pt transported to/from room in [facility] dependently for time management and energy conservation purposes. Worked on core strength and reaction time/coordination performing lateral rotations with unweighted ball<>bouncing on trampoline 2x15. Pt reported urgent need to have BM and transferred WC<>drop arm commode via slideboard with supervision/CGA with therapist stabilizing board. Pt required mod A to doff underwear/pants via lateral leans due to urgency. Pt continent of bowel and bladder and performed peri-care with supervision - charted in flowsheets. Pulled brief over hips via lateral leans with CGA for balance as pt leaning too far outside BOS to L side. Pt transferred bedside commode<>WC via slideboard with CGA and assist to stabilize board and performed multiple tricep push ups to pull pants over hips with total A due to fatigue. Pt sat in [place] and washed hands with set up assist. Concluded session with pt sitting in WC with RLE on trashcan to stretch hamstrings, needs within reach, and chair pad alarm on. Provided pt with fresh water.    Treatment Session 2 Received pt sitting in recliner with friend Austin [name] present for family education  training. Pt agreeable to PT treatment and denied any pain at rest but then reported increased pain in low back and along L tibial tuberosity wound - therefore deferred standing and did not put on prosthetic; however educated pt on importance  of wearing prosthetic if able to make transfers easier/safer. Session with emphasis on discharge planning, functional mobility/transfers, generalized strengthening and endurance, and simulated car transfers. Pt transferred recliner<>WC squat<>pivot with min A to clear buttocks - encouraged pt/Austin [name] to use slideboard for safety and to maximize independence with transfers. Pt performed WC mobility 135f using BUE and supervision to ortho gym. Pt performed simulated car transfer via slideboard and close supervision provided by SJudson [name]- cues to ensure RLE on floor prior to getting out of car. Pt transported to rehab apartment and performed furniture transfer on/off low sitting couch (Austin [name] reports her couch is an identical height). Pt transferred WC<>couch via slideboard with supervision downhill but required heavy min/mod A provided by SJudson Rochto slide uphill from couch<>WC. Pt performed WC mobility additional 588fusing BUE and supervision and transported remainder of way to dayroom dependently. Pt performed RLE strengthening on [place] at 40 cm/sec for 1 minute x 1 trial increasing to 20 cm/sec for additional 1 minute x 3 trials with therapist providing manual counter resistance with emphasis on glute/quad strength and core activation. [facility] and [facility] [place]udson [place] in for OT family education tomorrow. Concluded session with pt sitting in WC, needs within reach, and chair pad alarm on. Austin [name] present at bedside and RLE supported on trashcan to stretch R hamstring.   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

## 2022-03-19 NOTE — Progress Notes (Signed)
Patient ID: RUTHIE PALMESE, male   DOB: 1956/09/01, 66 y.o.   MRN: [id number]  Sw made attempt to reach patient S.O, [name]. Phone line disconnected. SW will follow up with patient.

## 2022-03-19 NOTE — Progress Notes (Addendum)
RLE dressing changed as ordered. 5mg  oxycodone given for post dressing change discomfort

## 2022-03-19 NOTE — Progress Notes (Signed)
Occupational Therapy Discharge Summary  Patient Details  Name: Austin [name] MRN: [id number] Date of Birth: [birth year]   Patient has met {NUMBERS 0-12:18577} of 4 long term goals due to improved activity tolerance, improved balance, and improved coordination.  Patient to discharge at overall Supervision level.  Patient's friend and sister are able to provide the necessary physical assistance at discharge.    Reasons goals not met: bathing goal not met at mod I level vs supervision for reaching 10/10 body parts secondary to poor safety awareness, however has bathed 8/10 parts with supervision (excluding peri-area)  Recommendation:  Patient will benefit from ongoing skilled OT services in home health setting to continue to advance functional skills in the area of BADL.  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 Braces or Orthoses: Other Brace Other Brace: RLE Darco shoe Restrictions Weight Bearing Restrictions: Yes RLE Weight Bearing: Weight bearing as tolerated Other Position/Activity Restrictions: minimal WB through R heel for transfers only wearing DARCO shoe ADL ADL Eating: Independent Where Assessed-Eating: Wheelchair Grooming: Independent Where Assessed-Grooming: Sitting at sink Upper Body Bathing: Independent Where Assessed-Upper Body Bathing: Sitting at sink Lower Body Bathing: Supervision/safety Where Assessed-Lower Body Bathing: Sitting at sink Upper Body Dressing: Independent Where Assessed-Upper Body Dressing: Sitting at sink Lower Body Dressing: Supervision/safety Where Assessed-Lower Body Dressing: Edge  of bed Toileting: Supervision/safety Where Assessed-Toileting: Bedside Commode, Control and instrumentation engineer Transfer: Close supervision Toilet Transfer Method: Engineer, water: Grab bars, Extra wide drop arm bedside commode Tub/Shower Transfer: Minimal assistance Tub/Shower Transfer Method: Squat pivot Tub/Shower Equipment: Radio broadcast assistant, Energy manager: Not assessed Social research officer, government Method: Unable to assess Vision Baseline Vision/History: 1 Wears glasses (wears glasses all the time but are currently broken) Patient Visual Report: No change from baseline Vision Assessment?: No apparent visual deficits Perception  Perception: Within Functional Limits Praxis Praxis: Intact Cognition Cognition Overall Cognitive Status: Within Functional Limits for tasks assessed Arousal/Alertness: Awake/alert Orientation Level: Person;Place;Situation Person: Oriented Place: Oriented Situation: Oriented Memory: Impaired Awareness: Appears intact Problem Solving: Impaired Safety/Judgment: Impaired Brief Interview for Mental Status (BIMS) Repetition of Three Words (First Attempt): 3 Temporal Orientation: Year: Correct Temporal Orientation: Month: Missed by 6 days to 1 month Temporal Orientation: Day: Incorrect Recall: "Sock": Yes, no cue required Recall: "Blue": Yes, no cue required Recall: "Bed": Yes, no cue required BIMS Summary Score: 13 Sensation Sensation Light Touch: Appears Intact Hot/Cold: Appears Intact Proprioception: Appears Intact Stereognosis: Not tested Coordination Gross Motor Movements are Fluid and Coordinated: No Fine Motor Movements are Fluid and Coordinated: Yes Coordination and Movement Description: generalized weakness and deconditioning with R LE WBing restrictions impacting movements Motor    Mobility     Trunk/Postural Assessment  Cervical Assessment Cervical Assessment: Within Functional Limits Thoracic  Assessment Thoracic Assessment: Exceptions to [facility] (thoracic rounding) Lumbar Assessment Lumbar Assessment: Exceptions to [facility] (posterior pelvic tilt in sitting) Postural Control Postural Control: Deficits on evaluation Trunk Control: poor Protective Responses: delayed  Balance   Extremity/Trunk Assessment RUE Assessment RUE Assessment: Within Functional Limits LUE Assessment LUE Assessment: Within Functional Limits General Strength Comments: no ring finger   Orlando Thalmann E Latondra Gebhart, MS, OTR/L  03/19/2022, 7:48 AM

## 2022-03-19 NOTE — Progress Notes (Signed)
PROGRESS NOTE   Subjective/Complaints: No new concerns. Has not tried robaxin yet.   ROS: No fever, chills, SOB, HA, CP, abdominal pain  Objective:   No results found. Recent Labs    03/18/22 0513  WBC 9.3  HGB 11.7*  HCT 35.5*  PLT 251     Recent Labs    03/18/22 0513  NA 140  K 3.8  CL 107  CO2 25  GLUCOSE 165*  BUN 17  CREATININE 0.83  CALCIUM 9.0      Intake/Output Summary (Last 24 hours) at 03/19/2022 0736 Last data filed at 03/18/2022 2300 Gross per 24 hour  Intake 716 ml  Output 275 ml  Net 441 ml      Pressure Injury 03/08/22 Pretibial Left;Proximal Unstageable - Full thickness tissue loss in which the base of the injury is covered by slough (yellow, tan, gray, green or brown) and/or eschar (tan, brown or black) in the wound bed. Unstageable caused by (Active)  03/08/22 1515  Location: Pretibial  Location Orientation: Left;Proximal  Staging: Unstageable - Full thickness tissue loss in which the base of the injury is covered by slough (yellow, tan, gray, green or brown) and/or eschar (tan, brown or black) in the wound bed.  Wound Description (Comments): Unstageable caused by prothesis.  Present on Admission: Yes    Physical Exam: Vital Signs Blood pressure 113/76, pulse 94, temperature 97.8 F (36.6 C), resp. rate 18, height 6\' 4"  (1.93 m), weight 98.4 kg, SpO2 95 %. Gen: no distress, normal appearing, sitting in bed  Constitutional: No distress . Vital signs reviewed. HEENT: NCAT, EOMI, oral membranes moist, conjugate gaze Neck: supple Cardiovascular: RRR without murmur. Respiratory/Chest: CTA Bilaterally without wheezes or rales. Normal effort    GI/Abdomen: BS +, non-tender, non-distended Ext: no clubbing, cyanosis, or edema Psych: pleasant and cooperative  Skin:    General: Skin is warm and dry.     Comments: 1cm Dry scabbed ulcer on left tibial tuberosity--foam dressing. Right foot  with dry dressing. Old Left 2nd digit amputation site well healed. Wearing DARCO show. --stable  Appearance.  L BKA Neurological:     Mental Status: He is alert and oriented to person, place, and time.    Assessment/Plan: 1. Functional deficits which require 3+ hours per day of interdisciplinary therapy in a comprehensive inpatient rehab setting. Physiatrist is providing close team supervision and 24 hour management of active medical problems listed below. Physiatrist and rehab team continue to assess barriers to discharge/monitor patient progress toward functional and medical goals  Care Tool:  Bathing    Body parts bathed by patient: Right arm, Left arm, Chest, Abdomen, Front perineal area, Right upper leg, Left upper leg, Right lower leg, Face, Buttocks     Body parts n/a: Left lower leg   Bathing assist Assist Level: Supervision/Verbal cueing     Upper Body Dressing/Undressing Upper body dressing   What is the patient wearing?: Pull over shirt    Upper body assist Assist Level: Set up assist    Lower Body Dressing/Undressing Lower body dressing      What is the patient wearing?: Incontinence brief, Pants     Lower body assist Assist  for lower body dressing: Minimal Assistance - Patient > 75%     Toileting Toileting    Toileting assist Assist for toileting: Supervision/Verbal cueing Assistive Device Comment: bariatric BSC   Transfers Chair/bed transfer  Transfers assist     Chair/bed transfer assist level: Minimal Assistance - Patient > 75% (squat<>pivot)     Locomotion Ambulation   Ambulation assist   Ambulation activity did not occur: Safety/medical concerns          Walk 10 feet activity   Assist  Walk 10 feet activity did not occur: Safety/medical concerns        Walk 50 feet activity   Assist Walk 50 feet with 2 turns activity did not occur: Safety/medical concerns         Walk 150 feet activity   Assist Walk 150 feet  activity did not occur: Safety/medical concerns         Walk 10 feet on uneven surface  activity   Assist Walk 10 feet on uneven surfaces activity did not occur: Safety/medical concerns         Wheelchair     Assist Is the patient using a wheelchair?: Yes Type of Wheelchair: Manual    Wheelchair assist level: Supervision/Verbal cueing Max wheelchair distance: 170ft    Wheelchair 50 feet with 2 turns activity    Assist        Assist Level: Supervision/Verbal cueing   Wheelchair 150 feet activity     Assist      Assist Level: Supervision/Verbal cueing   Blood pressure 113/76, pulse 94, temperature 97.8 F (36.6 C), resp. rate 18, height 6\' 4"  (1.93 m), weight 98.4 kg, SpO2 95 %.  Medical Problem List and Plan: 1. 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  H/o DVT: Continue eliquis             -antiplatelet therapy:  ASA/Plavix 3. Pain Management:  continue Oxycodone prn. Provided with pain relief journal 4. Mood: LCSW to follow for evaluation and support.              -antipsychotic agents: N/A 5. Neuropsych: This patient is capable of making decisions on his own behalf. 6. Skin/Wound Care:  Add protein supplement and vitamins to promote wound healing. 7. Fluids/Electrolytes/Nutrition: Monitor I/O. Check CMET in am             --Magnesium added to help with sleep, pain and BP 8. Osteomyelitis Right 5th toe s/p partial amputation: Augmentin with end date of 03/27/22  --follow up with ID after discharge.               --continue to monitor WBC--trending down.   -WBC was 10.2 on 6/5, continue to follow 9. CAD: Continue ASA, Plavix, Cozaar and Lipitor. M--monitor for symptoms with increase in activity.   -Denies chest pain  10. T2DM: Hgb A1C- 11.8 (was 12.2  09/2021)--followed by PCP/nutritionist in [place]              --continue Novolog 10 units tid for meal coverage with SSI for elevated BS.   Discussed CBGs with patient.   Increase Lantus to 33U BID             --  reports compliance with insulin regimen/diet PTA.   -6/11 Lantus increased from 35u to 37 units 6/9--increase to 39u today  -6/12 Glucose with fair control, continue to monitor CBG (last 3)   -6/13 Increase lantus to 41u from 39u Recent Labs    03/18/22 1638 03/18/22 2019 03/19/22 0609  GLUCAP 152* 334* 193*     11. 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   -6/11 socket adjusted for better limb contact. Still wearing foam pad over area of breakdown on tibia--monitor for fit/skin integrity 12.  A fib: Monitor HR/BP TID. Continue Eliquis.   -HR stable overall 13. Acute blood loss anemia:   -6/12 HGB 11.7 overall stable 14. Vitamin D deficiency: start ergocalciferol 50,000 U once per week for 7 weeks 15. Suboptimal magnesium level: started supplement  16. Screening for B12 deficiency: B12 reviewed and is normal at 746 on 02/16/22 17. Muscle spasms  -Advised to try PRN robaxin  LOS: 11 days A FACE TO FACE EVALUATION WAS PERFORMED  Jennye Boroughs 03/19/2022, 7:36 AM

## 2022-03-19 NOTE — Progress Notes (Signed)
Patient ID: Austin [name], male   DOB: 1955-12-04, 66 y.o.   MRN: [id number]  [facility] referral sent to [facility] Patient accepted

## 2022-03-19 NOTE — Progress Notes (Signed)
Physical Therapy Discharge Summary  Patient Details  Name: Austin [name] MRN: [id number] Date of Birth: 04-26-1956  {CHL IP REHAB PT TIME CALCULATION:304800500}  Patient has met {NUMBERS 0-12:18577} of 7 long term goals due to improved activity tolerance, improved balance, improved postural control, increased strength, increased range of motion, ability to compensate for deficits, improved awareness, and improved coordination.  Patient to discharge at a wheelchair level {LOA:3049010}.  Patient's care partner {care partner:[id number]} to provide the necessary {assistance:3041652} assistance at discharge.  Reasons goals not met: ***  Recommendation:  Patient will benefit from ongoing skilled PT services in home health setting to continue to advance safe functional mobility, address ongoing impairments in transfers, generalized strengthening and endurance, dynamic standing balance/coordination, amputee education, and to minimize fall risk.  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 Braces or Orthoses: Other Brace Other Brace: RLE Darco shoe Restrictions Weight Bearing Restrictions: Yes RLE Weight Bearing: Weight bearing as tolerated Other Position/Activity Restrictions: minimal WB through R heel for transfers only wearing DARCO shoe Pain Interference   Cognition Overall Cognitive Status: Within Functional Limits for tasks assessed Arousal/Alertness: Awake/alert Orientation Level: Oriented X4 Memory: Impaired Awareness: Appears intact Problem Solving: Impaired Safety/Judgment: Impaired Sensation   Motor     Mobility   Locomotion     Trunk/Postural Assessment  Cervical Assessment Cervical Assessment: Within Functional  Limits Thoracic Assessment Thoracic Assessment: Exceptions to [facility] (thoracic rounding) Lumbar Assessment Lumbar Assessment: Exceptions to [facility] (posterior pelvic tilt in sitting) Postural Control Postural Control: Deficits on evaluation Trunk Control: poor Protective Responses: delayed  Balance   Extremity Assessment        [name] PT, DPT  03/19/2022, 7:18 AM

## 2022-03-20 LAB — GLUCOSE, CAPILLARY
Glucose-Capillary: 175 mg/dL — ABNORMAL HIGH (ref 70–99)
Glucose-Capillary: 153 mg/dL — ABNORMAL HIGH (ref 70–99)
Glucose-Capillary: 189 mg/dL — ABNORMAL HIGH (ref 70–99)
Glucose-Capillary: 199 mg/dL — ABNORMAL HIGH (ref 70–99)

## 2022-03-20 MED ORDER — INSULIN ASPART 100 UNIT/ML IJ SOLN
13.0000 [IU] | Freq: Three times a day (TID) | INTRAMUSCULAR | Status: DC
  Administered 2022-03-20 – 2022-03-21 (×5): 13 [IU] via SUBCUTANEOUS

## 2022-03-20 NOTE — Progress Notes (Addendum)
Occupational Therapy Session Note  Patient Details  Name: Austin [name] MRN: [id number] Date of Birth: [birth year]  Today's Date: 03/20/2022 OT Individual Time: 1300-1420 OT Individual Time Calculation (min): 80 min    Short Term Goals: Week 2:  OT Short Term Goal 1 (Week 2): STG = LTG 2/2 ELOS  Skilled Therapeutic Interventions/Progress Updates:  Skilled OT intervention completed with focus on family education with pt's friend Austin [name] present regarding toilet/tub-shower transfers and toileting, RLE stretching, w/c management. Pt received seated in w/c, unrated pain in low back, pre-medicated with therapist offering repositioning and modifications for pain reduction. Pt self-propelled in w/c > bathroom in room, with pt able to demonstrate supervision A level series of lateral scoots from w/c <> DABBSC without use of grab bars to better simulate pt's environment. To practice lateral leans for pants management, pt doffed/donned pants over hips with supervision, with safety cues needed for keeping buttocks on seat vs posterior leaning as heavy as he was. Austin [name] was able to provide adequate supervision during transfer/toileting simulations and all transfers during session. Education provided to Austin [name] about anti-tippers not being on pt's w/c with safety concern of posterior pushing/leaning at pt's reported baseline for donning pants, with therapist highly advising against 2/2 falling back hazard and Austin [name] verbally stating she would supervise pt fully as she wasn't aware he did this at home before. Pt self-propelled in w/c > ADL bathroom > main gym using BUE with mod I. In ADL bathroom, pt completed supervision level lateral scoot from w/c <> tub bench, with safety cues needed for avoiding pushing up on w/c during transfer 2/2 it moving when not intended. Discussed shower safety and lateral leans vs standing.   Completed lateral scoot from w/c <> EOM with supervision then CGA for rolling to supine for RLE  stretching. Passive stretches with gravity assist completed x10 for 10 sec hold, then therapist providing light manual pressure on anterior aspect of knee for greater tolerable stretch in the hamstring x10 for 10 sec hold with pt limited with knee extension with about 3 in gap from back of knee to mat present. Seated in w/c, pt's R w/c break noticeably loose and wheel deviating during transfer. Therapist attempted to tighten w/c break, with slight adjustment made, however with inability of insurance coverage for new w/c, pt planning to d/c with his current w/c. Break was tightened slightly, however OT asking available PT if gap in schedule to see pt for break adjustment 2/2 OT time constraint. Discussed with Austin [name] about keeping an eye on it with extra CGA for transfers 2/2 safety concern. Offered pt and Austin [name] option of purchasing slideboard OOP 2/2 insurance denying coverage as pt really improves his safety with the board. Pt was left seated in w/c, with [name] in room, belt alarm on and all needs in reach at end of session.   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

## 2022-03-20 NOTE — Progress Notes (Signed)
PROGRESS NOTE   Subjective/Complaints:  Patient's chart reviewed- No issues reported overnight Vitals signs stable  Labs from Monday stable HH referral sent to [facility]  ROS: Denies fever, chills, SOB, HA, CP, abdominal pain  Objective:   No results found. Recent Labs    03/18/22 0513  WBC 9.3  HGB 11.7*  HCT 35.5*  PLT 251    Recent Labs    03/18/22 0513  NA 140  K 3.8  CL 107  CO2 25  GLUCOSE 165*  BUN 17  CREATININE 0.83  CALCIUM 9.0     Intake/Output Summary (Last 24 hours) at 03/20/2022 0701 Last data filed at 03/20/2022 [id number] Gross per 24 hour  Intake 592 ml  Output 850 ml  Net -258 ml     Pressure Injury 03/08/22 Pretibial Left;Proximal Unstageable - Full thickness tissue loss in which the base of the injury is covered by slough (yellow, tan, gray, green or brown) and/or eschar (tan, brown or black) in the wound bed. Unstageable caused by (Active)  03/08/22 1515  Location: Pretibial  Location Orientation: Left;Proximal  Staging: Unstageable - Full thickness tissue loss in which the base of the injury is covered by slough (yellow, tan, gray, green or brown) and/or eschar (tan, brown or black) in the wound bed.  Wound Description (Comments): Unstageable caused by prothesis.  Present on Admission: Yes    Physical Exam: Vital Signs Blood pressure 126/80, pulse 64, temperature 97.8 F (36.6 C), temperature source Oral, resp. rate 18, height 6\' 4"  (1.93 m), weight 98.4 kg, SpO2 100 %. Gen: no distress, normal appearing, sitting in bed  Constitutional: No distress . Vital signs reviewed. HEENT: NCAT, EOMI, oral membranes moist, conjugate gaze Neck: supple Cardiovascular: RRR without murmur. Respiratory/Chest: CTA Bilaterally without wheezes or rales. Normal effort    GI/Abdomen: BS +, non-tender, non-distended Ext: no clubbing, cyanosis, or edema Psych: pleasant and cooperative  Skin:    General:  Skin is warm and dry.     Comments: 1cm Dry scabbed ulcer on left tibial tuberosity--foam dressing. Right foot with dry dressing. Old Left 2nd digit amputation site well healed. Wearing DARCO show. --stable  Appearance.  L BKA Neurological:     Mental Status: He is alert and oriented to person, place, and time.  Functional mobility: Self-propelling in wheelchair   Assessment/Plan: 1. Functional deficits which require 3+ hours per day of interdisciplinary therapy in a comprehensive inpatient rehab setting. Physiatrist is providing close team supervision and 24 hour management of active medical problems listed below. Physiatrist and rehab team continue to assess barriers to discharge/monitor patient progress toward functional and medical goals  Care Tool:  Bathing    Body parts bathed by patient: Right arm, Left arm, Chest, Abdomen, Front perineal area, Right upper leg, Left upper leg, Right lower leg, Face, Buttocks     Body parts n/a: Left lower leg   Bathing assist Assist Level: Supervision/Verbal cueing     Upper Body Dressing/Undressing Upper body dressing   What is the patient wearing?: Pull over shirt    Upper body assist Assist Level: Set up assist    Lower Body Dressing/Undressing Lower body dressing  What is the patient wearing?: Incontinence brief, Pants     Lower body assist Assist for lower body dressing: Minimal Assistance - Patient > 75%     Toileting Toileting    Toileting assist Assist for toileting: Supervision/Verbal cueing Assistive Device Comment: bariatric BSC   Transfers Chair/bed transfer  Transfers assist     Chair/bed transfer assist level: Minimal Assistance - Patient > 75% (squat<>pivot)     Locomotion Ambulation   Ambulation assist   Ambulation activity did not occur: Safety/medical concerns          Walk 10 feet activity   Assist  Walk 10 feet activity did not occur: Safety/medical concerns        Walk 50  feet activity   Assist Walk 50 feet with 2 turns activity did not occur: Safety/medical concerns         Walk 150 feet activity   Assist Walk 150 feet activity did not occur: Safety/medical concerns         Walk 10 feet on uneven surface  activity   Assist Walk 10 feet on uneven surfaces activity did not occur: Safety/medical concerns         Wheelchair     Assist Is the patient using a wheelchair?: Yes Type of Wheelchair: Manual    Wheelchair assist level: Supervision/Verbal cueing Max wheelchair distance: 114ft    Wheelchair 50 feet with 2 turns activity    Assist        Assist Level: Supervision/Verbal cueing   Wheelchair 150 feet activity     Assist      Assist Level: Supervision/Verbal cueing   Blood pressure 126/80, pulse 64, temperature 97.8 F (36.6 C), temperature source Oral, resp. rate 18, height 6\' 4"  (1.93 m), weight 98.4 kg, SpO2 100 %.  Medical Problem List and Plan: 1. Functional deficits secondary to right 5th digit amputation             -patient may shower             -ELOS/Goals: 10-14 days S             -Continue CIR therapies including PT, OT  -WC mobility completed at 162ft 2.  H/o DVT: Continue eliquis             -antiplatelet therapy:  ASA/Plavix 3. Pain Management:  continue Oxycodone prn. Provided with pain relief journal. Provided list of foods that can help for pain. 4. Mood: LCSW to follow for evaluation and support.              -antipsychotic agents: N/A 5. Neuropsych: This patient is capable of making decisions on his own behalf. 6. Skin/Wound Care:  Add protein supplement and vitamins to promote wound healing. 7. Fluids/Electrolytes/Nutrition: Monitor I/O. Check CMET in am             --Magnesium added to help with sleep, pain and BP 8. Osteomyelitis Right 5th toe s/p partial amputation: Augmentin with end date of 03/27/22  --follow up with ID after discharge.               --continue to monitor  WBC--trending down.   -WBC was 10.2 on 6/5, continue to follow 9. CAD: Continue ASA, Plavix, Cozaar and Lipitor. M--monitor for symptoms with increase in activity.   -Denies chest pain  10. T2DM: Hgb A1C- 11.8 (was 12.2 09/2021)--followed by PCP/nutritionist in [place]              --continue Novolog  10 units tid for meal coverage with SSI for elevated BS.   Discussed CBGs with patient.   Increase Lantus to 33U BID             --reports compliance with insulin regimen/diet PTA.   -6/11 Lantus increased from 35u to 37 units 6/9--increase to 39u today  -6/12 Glucose with fair control, continue to monitor CBG (last 3)   -6/13 Increase lantus to 41u from 39u Recent Labs    03/19/22 1638 03/19/22 2038 03/20/22 0612  GLUCAP 151* 190* 175*    11. 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   -6/11 socket adjusted for better limb contact. Still wearing foam pad over area of breakdown on tibia--monitor for fit/skin integrity 12.  A fib: Monitor HR/BP TID. Continue Eliquis.   -HR stable overall 13. Acute blood loss anemia:   -6/12 HGB 11.7 overall stable 14. Vitamin D deficiency: start ergocalciferol 50,000 U once per week for 7 weeks 15. Suboptimal magnesium level: started supplement  16. Screening for B12 deficiency: B12 reviewed and is normal at 746 on 02/16/22 17. Muscle spasms  -Advised to try PRN robaxin  LOS: 12 days A FACE TO FACE EVALUATION WAS PERFORMED  Velva Molinari P Shiven Junious 03/20/2022, 7:01 AM

## 2022-03-20 NOTE — Progress Notes (Signed)
Patient ID: Austin [name], male   DOB: 14-Sep-1956, 66 y.o.   MRN: [id number]  [employer] contact information provided to patient friend

## 2022-03-20 NOTE — Plan of Care (Signed)
  Problem: Consults Goal: RH GENERAL PATIENT EDUCATION Description: See Patient Education module for education specifics. Outcome: Progressing Goal: Skin Care Protocol Initiated - if Braden Score 18 or less Description: If consults are not indicated, leave blank or document N/A Outcome: Progressing Goal: Diabetes Guidelines if Diabetic/Glucose > 140 Description: If diabetic or lab glucose is > 140 mg/dl - Initiate Diabetes/Hyperglycemia Guidelines & Document Interventions  Outcome: Progressing   Problem: RH SKIN INTEGRITY Goal: RH STG MAINTAIN SKIN INTEGRITY WITH ASSISTANCE Description: STG Maintain Skin Integrity With min Assistance. Outcome: Progressing Goal: RH STG ABLE TO PERFORM INCISION/WOUND CARE W/ASSISTANCE Description: STG Able To Perform Incision/Wound Care With min Assistance. Outcome: Progressing   Problem: RH SAFETY Goal: RH STG ADHERE TO SAFETY PRECAUTIONS W/ASSISTANCE/DEVICE Description: STG Adhere to Safety Precautions With Cues and Reminders. Outcome: Progressing Goal: RH STG DECREASED RISK OF FALL WITH ASSISTANCE Description: STG Decreased Risk of Fall With [employer]. Outcome: Progressing   Problem: RH KNOWLEDGE DEFICIT GENERAL Goal: RH STG INCREASE KNOWLEDGE OF SELF CARE AFTER HOSPITALIZATION Description: Patient will demonstrate knowledge of self-care management, medication management, skin/wound care, weight bearing precautions with educational materials and handouts provided by staff independently at discharge. Outcome: Progressing

## 2022-03-20 NOTE — Progress Notes (Signed)
Physical Therapy Session Note  Patient Details  Name: Austin [name] MRN: [id number] Date of Birth: 08/16/1956  Today's Date: 03/20/2022 PT Individual Time: 6503-5465 PT Individual Time Calculation (min): 55 min   Short Term Goals: Week 1:  PT Short Term Goal 1 (Week 1): Pt will perform supine<>sit with supervision PT Short Term Goal 1 - Progress (Week 1): Met PT Short Term Goal 2 (Week 1): Pt will perform bed<>chair transfers using LRAD simulating home set-up with CGA PT Short Term Goal 2 - Progress (Week 1): Met PT Short Term Goal 3 (Week 1): Pt will perform real-life car transfer with min assist PT Short Term Goal 3 - Progress (Week 1): Progressing toward goal PT Short Term Goal 4 (Week 1): Pt will demonstrate ability to set-up transfers managing w/c parts with only intermittent cuing PT Short Term Goal 4 - Progress (Week 1): Met Week 2:  PT Short Term Goal 1 (Week 2): STG=LTG due to LOS  Skilled Therapeutic Interventions/Progress Updates:   Received pt semi-reclined in bed with [name] present at bedside. Pt agreeable to PT treatment and denied any pain initially but reported increased pain rated 8/10 in low back pain and soreness along L tibial tuberosity wound with mobility - RN notified and present to administer pain medication. Pt declined wearing prosthetic due to increased pain this morning. Session with emphasis on discharge planning, functional mobility/transfers, generalized strengthening and endurance, and WC mobility. Went through pain interference questionnaire, sensation, and MMT and pt transferred semi-reclined<>sitting EOB with HOB elevated and heavy reliance on bedrails with supervision and increased time/effort. Donned R Darco shoe with max A and pt transferred bed<>WC via slideboard with min A provided by [name] - although informed [name] that pt is able to perform slideboard transfers with supervision when allowed the opportunity. Pt sat in WC at sink and washed face and  brushed teeth mod I. Pt then performed WC mobility 144f using BUE and mod I to main therapy gym. Pt transferred to/from mat via slideboard with supervision. Pt transferred sit<>semi-reclined on wedge with supervision and required increased time to position for comfort due to back pain. Pt then performed the following exercises with supervision and verbal cues for technique: -LLE SLR 2x10 -LLE hip abduction 2x10 -hip adduction ball squeezes 2x10 -passive R hamstring stretch for 3 minutes Pt transferred semi-reclined<>sitting EOM with mod A due to poor core strength and transported back to room in WC dependently. Concluded session with pt sitting in WC, needs within reach, and chair pad alarm on. Provided pt with heat packs for low back.   Therapy Documentation Precautions:  Precautions Precautions: Fall, Other (comment) Precaution Comments: L BKA (has prosthetic) Required Braces or Orthoses: Other Brace Other Brace: RLE Darco shoe Restrictions Weight Bearing Restrictions: Yes RLE Weight Bearing: Weight bearing as tolerated Other Position/Activity Restrictions: minimal WB through R heel for transfers only wearing DARCO shoe  Therapy/Group: Individual Therapy AAlfonse AlpersPT, DPT  03/20/2022, 6:58 AM

## 2022-03-20 NOTE — Patient Care Conference (Signed)
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 Leeroy Cha Social Worker Present: [name], [place] Nurse Present: Dorthula Nettles, RN PT Present: Becky Sax, PT OT Present: Jennefer Bravo, OT PPS Coordinator present : Gunnar Fusi, SLP     Current Status/Progress Goal Weekly Team Focus  Bowel/Bladder   continent b/b  remain continent  toilet as needed   Swallow/Nutrition/ Hydration             ADL's   Min A bathing, set up A UB dressing, supervision LB dressing, min A toileting, supervision lateral scoot to bariatric drop arm BSC  downgraded goals from grossly mod I to supervision for safety  d/c planning, HEP, endurance   Mobility   bed mobility supervision using bed features, squat<>pivots CGA, lateral scoots with SB supervision, sit<>stands with RW min A, unable to ambulate due to precautions  supervision/mod I WC mobility  functional mobility/transfers, generalized strengthening and endurance, dynamic standing balance/coordination, pain management, equipment management, and D/C planning.   Communication             Safety/Cognition/ Behavioral Observations            Pain   no reported pain  < 3  assess pain q 4 hr and prn   Skin   left leg unstageable, right leg, thigh, foot, groin incision, fissure to  sacrum, right leg venous stasis ulcer  no new breakdown  assess skin q shift and prn     Discharge Planning:  d/c home with S.o. Family edu complete   Team Discussion: Increased CBG's. On max dose of long acting insulin. Sleepy but participates. Continent B/B, no reported pain. Multiple incisions to right leg. Left leg unstageable, fissure to sacrum, venous stasis ulcer to right leg. Discharging home with sister. Significant other to assist, as well as brother. Family education today. Today is grad day.  Patient on target to meet rehab goals: yes, supervision to mod I goals. Currently min assist bathing, supervision toileting, bed mobility. Did downgrade goals due to safety.  *See Care Plan and progress notes for long and short-term goals.   Revisions to Treatment Plan:  Finalizing discharge plans   Teaching Needs: Family education complete   Current Barriers to Discharge: No current barriers  Possible Resolutions to Barriers: All barriers addressed     Medical Summary Current Status: impaired safety awareness and cognition, type 2 DM with hyperglycemia  Barriers to Discharge: Medical stability;Wound care;Behavior;Weight bearing restrictions;Weight  Barriers to Discharge Comments: impaired safety awareness and cognition, type 2 DM with hyperglycemia Possible Resolutions to [employer] Focus: continue to monitor CBGs and discussed insulin regimen for home, continue daily wound care   Continued Need for Acute Rehabilitation Level of Care: The patient requires daily medical management by a physician with specialized training in physical medicine  and rehabilitation for the following reasons: Direction of a multidisciplinary physical rehabilitation program to maximize functional independence : Yes Medical management of patient stability for increased activity during participation in an intensive rehabilitation regime.: Yes Analysis of laboratory values and/or radiology reports with  any subsequent need for medication adjustment and/or medical intervention. : Yes   I attest that I was present, lead the team conference, and concur with the assessment and plan of the team.   Cristi Loron 03/20/2022, 3:43 PM

## 2022-03-20 NOTE — Progress Notes (Signed)
Physical Therapy Session Note  Patient Details  Name: Austin [name] MRN: [id number] Date of Birth: 1956/03/16  Today's Date: 03/20/2022 PT Individual Time: 1445-1500 PT Individual Time Calculation (min): 15 min   Short Term Goals:  Week 2:  PT Short Term Goal 1 (Week 2): STG=LTG due to LOS   Skilled Therapeutic Interventions/Progress Updates:   Pt received sitting in WC. PT assessed WC due to reports from pt the R side wheel unable to lock. Pt performed slode board transfer to [facility] with set up of board by PT. PT performed WC wheel lock adjustments by providing new wheel lock on the R side to allow improved safety with transfers and mobility through wheelchair in home environment. Left supine in bed with call bell in reach and all needs met.       Therapy Documentation Precautions:  Precautions Precautions: Fall, Other (comment) Precaution Comments: L BKA (has prosthetic) Required Braces or Orthoses: Other Brace Other Brace: RLE Darco shoe Restrictions Weight Bearing Restrictions: Yes RLE Weight Bearing: Weight bearing as tolerated Other Position/Activity Restrictions: minimal WB through R heel for transfers only wearing DARCO shoe  Vital Signs: Therapy Vitals Temp: 98 F (36.7 C) Temp Source: Oral Pulse Rate: 75 Resp: 20 BP: 112/90 Patient Position (if appropriate): Lying Oxygen Therapy SpO2: 99 % O2 Device: Room Air Pain: Pain Assessment Pain Scale: 0-10 Pain Score: 8  Pain Type: Acute pain Pain Location: Back Pain Orientation: Posterior Pain Radiating Towards: lower Pain Descriptors / Indicators: Aching Pain Frequency: Intermittent Pain Onset: On-going Patients Stated Pain Goal: 1 Pain Intervention(s): Medication (See eMAR) Multiple Pain Sites: No     Therapy/Group: Individual Therapy  Lorie Phenix 03/20/2022, 3:25 PM

## 2022-03-20 NOTE — Progress Notes (Signed)
Inpatient Rehabilitation Care Coordinator Discharge Note   Patient Details  Name: Austin [name] MRN: [id number] Date of Birth: 1956-08-09   Discharge location: Home  Length of Stay: 13 Days  Discharge activity level: SUP/Min  Home/community participation: Noel Christmas  Patient response PP:JKDTOI Literacy - How often do you need to have someone help you when you read instructions, pamphlets, or other written material from your doctor or pharmacy?: Always  Patient response ZT:IWPYKD Isolation - How often do you feel lonely or isolated from those around you?: Never  Services provided included: SW, Pharmacy, TR, CM, RN, SLP, OT, PT, RD, MD  Financial Services:  Financial Services Utilized: Private Insurance [facility] MEDICARE  Choices offered to/list presented to:    Follow-up services arranged:  Home Health Home Health Agency: SN/PT/OT         Patient response to transportation need: Is the patient able to respond to transportation needs?: Yes In the past 12 months, has lack of transportation kept you from medical appointments or from getting medications?: No In the past 12 months, has lack of transportation kept you from meetings, work, or from getting things needed for daily living?: No    Comments (or additional information):  Patient/Family verbalized understanding of follow-up arrangements:  Yes  Individual responsible for coordination of the follow-up plan: Deloris  Confirmed correct DME delivered: Andria Rhein 03/20/2022    Andria Rhein

## 2022-03-20 NOTE — Progress Notes (Signed)
Occupational Therapy Session Note  Patient Details  Name: Austin [name] MRN: [id number] Date of Birth: 01/18/1956  Today's Date: 03/20/2022 OT Individual Time: 1030-1130 OT Individual Time Calculation (min): 60 min    Short Term Goals: Week 2:  OT Short Term Goal 1 (Week 2): STG = LTG 2/2 ELOS  Skilled Therapeutic Interventions/Progress Updates:    S: Patient in w/c with friend visiting in room. Pt agreed to take a shower.   - Patient participated in skilled OT session focusing on increasing ADL performance while bathing in shower utilizing tub bench for increased safety. Shower transfer was completed using SB from w/c to ETB with SBA and VC for form and technique. Patient completed all bathing while seated including LB which he performed lateral leans right and left to reach all areas. Once pt returned to w/c, he used SB once more to transfer from w/c to bed to allow Therapist to change barrier pad on buttocks. Nurse was call in to check a few skin areas. Patient completed dressing seated on EOB. He was able to complete all dressing tasks with increased time. Boot was removed on right foot to decrease weight and difficulty with moving and managing lower extremity. Education provided to patient and friend on how to perform toilet hygiene at home by performing lateral leans right and left. Pt and friend verbalized understanding.     Therapy Documentation Precautions:  Precautions Precautions: Fall, Other (comment) Precaution Comments: L BKA (has prosthetic) Required Braces or Orthoses: Other Brace Other Brace: RLE Darco shoe Restrictions Weight Bearing Restrictions: Yes RLE Weight Bearing: Weight bearing as tolerated Other Position/Activity Restrictions: minimal WB through R heel for transfers only wearing DARCO shoe  Pain: Reports low back pain when he is in bed. He denies pain when seated in w/c. No number provided.     Therapy/Group: Individual Therapy  [name],  OTR/L,CBIS  Supplemental OT - [place] and [facility]  03/20/2022, 12:57 PM

## 2022-03-21 ENCOUNTER — Other Ambulatory Visit (HOSPITAL_COMMUNITY): Payer: Self-pay

## 2022-03-21 ENCOUNTER — Ambulatory Visit: Payer: Medicaid Other | Admitting: Internal Medicine

## 2022-03-21 DIAGNOSIS — D62 Acute posthemorrhagic anemia: Secondary | ICD-10-CM

## 2022-03-21 LAB — GLUCOSE, CAPILLARY
Glucose-Capillary: 230 mg/dL — ABNORMAL HIGH (ref 70–99)
Glucose-Capillary: 148 mg/dL — ABNORMAL HIGH (ref 70–99)
Glucose-Capillary: 115 mg/dL — ABNORMAL HIGH (ref 70–99)

## 2022-03-21 MED ORDER — VITAMIN D (ERGOCALCIFEROL) 1.25 MG (50000 UNIT) PO CAPS
50000.0000 [IU] | ORAL_CAPSULE | ORAL | 0 refills | Status: DC
  Filled 2022-03-21: qty 5, 35d supply, fill #0

## 2022-03-21 MED ORDER — PANTOPRAZOLE SODIUM 40 MG PO TBEC
40.0000 mg | DELAYED_RELEASE_TABLET | Freq: Every day | ORAL | 0 refills | Status: DC
  Filled 2022-03-21: qty 30, 30d supply, fill #0

## 2022-03-21 MED ORDER — OXYCODONE HCL 5 MG PO TABS
5.0000 mg | ORAL_TABLET | Freq: Two times a day (BID) | ORAL | 0 refills | Status: DC | PRN
  Filled 2022-03-21: qty 14, 7d supply, fill #0

## 2022-03-21 MED ORDER — RISAQUAD PO CAPS
1.0000 | ORAL_CAPSULE | Freq: Every day | ORAL | 1 refills | Status: DC
  Filled 2022-03-21: qty 60, 60d supply, fill #0

## 2022-03-21 MED ORDER — MAGNESIUM OXIDE 400 MG PO TABS
200.0000 mg | ORAL_TABLET | Freq: Every day | ORAL | 0 refills | Status: DC
  Filled 2022-03-21: qty 30, 60d supply, fill #0

## 2022-03-21 MED ORDER — POLYETHYLENE GLYCOL 3350 17 GM/SCOOP PO POWD
17.0000 g | Freq: Every day | ORAL | 0 refills | Status: DC
  Filled 2022-03-21: qty 238, 14d supply, fill #0

## 2022-03-21 MED ORDER — AMOXICILLIN-POT CLAVULANATE 875-125 MG PO TABS
1.0000 | ORAL_TABLET | Freq: Two times a day (BID) | ORAL | 0 refills | Status: DC
  Filled 2022-03-21: qty 20, 10d supply, fill #0

## 2022-03-21 MED ORDER — INSULIN PEN NEEDLE 32G X 4 MM MISC
1.0000 | Freq: Three times a day (TID) | 0 refills | Status: DC
  Filled 2022-03-21: qty 200, 30d supply, fill #0

## 2022-03-21 MED ORDER — INSULIN GLARGINE 100 UNIT/ML SOLOSTAR PEN
41.0000 [IU] | PEN_INJECTOR | Freq: Two times a day (BID) | SUBCUTANEOUS | 0 refills | Status: DC
  Filled 2022-03-21: qty 15, 18d supply, fill #0

## 2022-03-21 MED ORDER — ASCORBIC ACID 500 MG PO TABS
500.0000 mg | ORAL_TABLET | Freq: Two times a day (BID) | ORAL | 0 refills | Status: AC
  Filled 2022-03-21: qty 60, 30d supply, fill #0

## 2022-03-21 MED ORDER — MEDIHONEY WOUND/BURN DRESSING EX PSTE
1.0000 | PASTE | Freq: Every day | CUTANEOUS | 1 refills | Status: DC
Start: 2022-03-21 — End: 2023-06-30
  Filled 2022-03-21: qty 44, 44d supply, fill #0

## 2022-03-21 MED ORDER — CLOPIDOGREL BISULFATE 75 MG PO TABS
75.0000 mg | ORAL_TABLET | Freq: Every day | ORAL | 0 refills | Status: DC
  Filled 2022-03-21: qty 10, 10d supply, fill #0

## 2022-03-21 MED ORDER — ZINC SULFATE 220 (50 ZN) MG PO TABS
220.0000 mg | ORAL_TABLET | Freq: Every day | ORAL | 0 refills | Status: AC
  Filled 2022-03-21: qty 30, 30d supply, fill #0

## 2022-03-21 MED ORDER — APIXABAN 5 MG PO TABS
5.0000 mg | ORAL_TABLET | Freq: Two times a day (BID) | ORAL | 0 refills | Status: DC
  Filled 2022-03-21: qty 60, 30d supply, fill #0

## 2022-03-21 MED ORDER — LOSARTAN POTASSIUM 25 MG PO TABS
25.0000 mg | ORAL_TABLET | Freq: Every day | ORAL | 0 refills | Status: AC
  Filled 2022-03-21: qty 30, 30d supply, fill #0

## 2022-03-21 MED ORDER — METHOCARBAMOL 500 MG PO TABS
500.0000 mg | ORAL_TABLET | Freq: Four times a day (QID) | ORAL | 0 refills | Status: DC | PRN
  Filled 2022-03-21: qty 15, 4d supply, fill #0

## 2022-03-21 MED ORDER — INSULIN ASPART 100 UNIT/ML IJ SOLN: 13.0000 [IU] | Freq: Three times a day (TID) | INTRAMUSCULAR | 11 refills | Status: DC

## 2022-03-21 MED ORDER — ATORVASTATIN CALCIUM 80 MG PO TABS
80.0000 mg | ORAL_TABLET | Freq: Every day | ORAL | 0 refills | Status: DC
  Filled 2022-03-21: qty 30, 30d supply, fill #0

## 2022-03-21 MED ORDER — CLOPIDOGREL BISULFATE 75 MG PO TABS: 75.0000 mg | ORAL_TABLET | Freq: Every day | ORAL | 0 refills | Status: DC

## 2022-03-21 NOTE — Progress Notes (Signed)
PROGRESS NOTE   Subjective/Complaints: No new complaints this morning   ROS: Denies fever, chills, SOB, HA, CP, abdominal pain  Objective:   No results found. No results for input(s): "WBC", "HGB", "HCT", "PLT" in the last 72 hours.   No results for input(s): "NA", "K", "CL", "CO2", "GLUCOSE", "BUN", "CREATININE", "CALCIUM" in the last 72 hours.    Intake/Output Summary (Last 24 hours) at 03/21/2022 0916 Last data filed at 03/21/2022 0175 Gross per 24 hour  Intake 733 ml  Output 1600 ml  Net -867 ml     Pressure Injury 03/08/22 Pretibial Left;Proximal Unstageable - Full thickness tissue loss in which the base of the injury is covered by slough (yellow, tan, gray, green or brown) and/or eschar (tan, brown or black) in the wound bed. Unstageable caused by (Active)  03/08/22 1515  Location: Pretibial  Location Orientation: Left;Proximal  Staging: Unstageable - Full thickness tissue loss in which the base of the injury is covered by slough (yellow, tan, gray, green or brown) and/or eschar (tan, brown or black) in the wound bed.  Wound Description (Comments): Unstageable caused by prothesis.  Present on Admission: Yes    Physical Exam: Vital Signs Blood pressure 121/75, pulse 75, temperature 97.8 F (36.6 C), resp. rate 18, height 6\' 4"  (1.93 m), weight 98.4 kg, SpO2 100 %. Gen: no distress, normal appearing, sitting in bed  Constitutional: No distress . Vital signs reviewed. HEENT: NCAT, EOMI, oral membranes moist, conjugate gaze Neck: supple Cardiovascular: RRR without murmur. Respiratory/Chest: CTA Bilaterally without wheezes or rales. Normal effort    GI/Abdomen: BS +, non-tender, non-distended Ext: no clubbing, cyanosis, or edema Psych: pleasant and cooperative  Skin:    General: Skin is warm and dry.     Comments: 1cm Dry scabbed ulcer on left tibial tuberosity--foam dressing. Right foot with dry dressing. Old  Left 2nd digit amputation site well healed. Wearing DARCO show. --stable  Appearance.  L BKA Neurological:     Mental Status: He is alert and oriented to person, place, and time.  Functional mobility: Self-propelling in wheelchair. Can do sliding board transfer with set-up   Assessment/Plan: 1. Functional deficits which require 3+ hours per day of interdisciplinary therapy in a comprehensive inpatient rehab setting. Physiatrist is providing close team supervision and 24 hour management of active medical problems listed below. Physiatrist and rehab team continue to assess barriers to discharge/monitor patient progress toward functional and medical goals  Care Tool:  Bathing    Body parts bathed by patient: Right arm, Left arm, Chest, Abdomen, Front perineal area, Buttocks, Right upper leg, Left upper leg, Face     Body parts n/a: Left lower leg, Right lower leg   Bathing assist Assist Level: Supervision/Verbal cueing     Upper Body Dressing/Undressing Upper body dressing   What is the patient wearing?: Pull over shirt    Upper body assist Assist Level: Independent    Lower Body Dressing/Undressing Lower body dressing      What is the patient wearing?: Incontinence brief, Pants     Lower body assist Assist for lower body dressing: Supervision/Verbal cueing     Toileting Toileting    Toileting assist Assist  for toileting: Supervision/Verbal cueing Assistive Device Comment: Urinal   Transfers Chair/bed transfer  Transfers assist     Chair/bed transfer assist level: Supervision/Verbal cueing     Locomotion Ambulation   Ambulation assist   Ambulation activity did not occur: Safety/medical concerns (orders for WB through R heel with Darco shoe for transfers only)          Walk 10 feet activity   Assist  Walk 10 feet activity did not occur: Safety/medical concerns (orders for WB through R heel with Darco shoe for transfers only)        Walk 50 feet  activity   Assist Walk 50 feet with 2 turns activity did not occur: Safety/medical concerns (orders for WB through R heel with Darco shoe for transfers only)         Walk 150 feet activity   Assist Walk 150 feet activity did not occur: Safety/medical concerns (orders for WB through R heel with Darco shoe for transfers only)         Walk 10 feet on uneven surface  activity   Assist Walk 10 feet on uneven surfaces activity did not occur: Safety/medical concerns (orders for WB through R heel with Darco shoe for transfers only)         Wheelchair     Assist Is the patient using a wheelchair?: Yes Type of Wheelchair: Manual    Wheelchair assist level: Independent Max wheelchair distance: 178ft    Wheelchair 50 feet with 2 turns activity    Assist        Assist Level: Independent   Wheelchair 150 feet activity     Assist      Assist Level: Independent   Blood pressure 121/75, pulse 75, temperature 97.8 F (36.6 C), resp. rate 18, height 6\' 4"  (1.93 m), weight 98.4 kg, SpO2 100 %.  Medical Problem List and Plan: 1. Functional deficits secondary to right 5th digit amputation             -patient may shower             -ELOS/Goals: 10-14 days S             -Continue CIR therapies including PT, OT  -WC mobility completed at 149ft 2.  H/o DVT: Continue eliquis             -antiplatelet therapy:  ASA/Plavix 3. Pain:  continue Oxycodone prn. Provided with pain relief journal. Provided list of foods that can help for pain. 4. Mood: LCSW to follow for evaluation and support.              -antipsychotic agents: N/A 5. Neuropsych: This patient is capable of making decisions on his own behalf. 6. Skin/Wound Care:  Add protein supplement and vitamins to promote wound healing. 7. Fluids/Electrolytes/Nutrition: Monitor I/O. Check CMET in am             --Magnesium added to help with sleep, pain and BP 8. Osteomyelitis Right 5th toe s/p partial amputation:  Augmentin with end date of 03/27/22  --follow up with ID after discharge.               --continue to monitor WBC--trending down.   -WBC was 10.2 on 6/5, continue to follow 9. CAD: Continue ASA, Plavix, Cozaar and Lipitor. M--monitor for symptoms with increase in activity.   -Denies chest pain  10. T2DM: Hgb A1C- 11.8 (was 12.2 09/2021)--followed by PCP/nutritionist in [place]              --  continue Novolog 10 units tid for meal coverage with SSI for elevated BS.   Discussed CBGs with patient.   Increase Lantus to 33U BID             --reports compliance with insulin regimen/diet PTA.   -6/11 Lantus increased from 35u to 37 units 6/9--increase to 39u today  -6/12 Glucose with fair control, continue to monitor CBG (last 3)   -6/13 Increase lantus to 41u from 39u  6/15: increase Novolog to 13U Recent Labs    03/20/22 1633 03/20/22 2105 03/21/22 0600  GLUCAP 189* 199* 230*    11. 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   -6/11 socket adjusted for better limb contact. Still wearing foam pad over area of breakdown on tibia--monitor for fit/skin integrity 12.  A fib: Monitor HR/BP TID. Continue Eliquis.   -HR stable overall 13. Acute blood loss anemia:   -6/12 HGB 11.7 overall stable 14. Vitamin D deficiency: continue ergocalciferol 50,000 U once per week for 7 weeks 15. Suboptimal magnesium level: started supplement  16. Screening for B12 deficiency: B12 reviewed and is normal at 746 on 02/16/22 17. Muscle spasms  -Advised to try PRN robaxin  LOS: 13 days A FACE TO FACE EVALUATION WAS PERFORMED  Austin [name] 03/21/2022, 9:16 AM

## 2022-03-21 NOTE — Progress Notes (Signed)
Inpatient Rehabilitation Discharge Medication Review by a Pharmacist  A complete drug regimen review was completed for this patient to identify any potential clinically significant medication issues.  High Risk Drug Classes Is patient taking? Indication by Medication  Antipsychotic No   Anticoagulant Yes Apixaban - hx DVT  Antibiotic Yes Augmentin - osteomyelitis  Opioid Yes Oxycodone prn - severe pain  Antiplatelet Yes Aspirin 81 mg, Plavix - recent iliac stent 02/20/22  Hypoglycemics/insulin Yes Lantus, SSI - glucose control  Vasoactive Medication Yes Losartan - HTN  Chemotherapy No   Other Yes Atorvastatin - HLD Pantoprazole - GI prophylaxis Acidophilus -probiotic Miralax - laxative Mag Oxide, Vitamin C, Vitamin D, Vitamin E, Zinc - supplements PRNs: Methocarbamol - muscle spasms     Type of Medication Issue Identified Description of Issue Recommendation(s)  Drug Interaction(s) (clinically significant)     Duplicate Therapy     Allergy     No Medication Administration End Date     Incorrect Dose     Additional Drug Therapy Needed     Significant med changes from prior encounter (inform family/care partners about these prior to discharge).    Other       Clinically significant medication issues were identified that warrant physician communication and completion of prescribed/recommended actions by midnight of the next day:  Yes  Name of provider notified for issues identified: Delle Reining, PA-C  Provider Method of Notification:  secure chat  Pharmacist comments:   Augmentin previously expected to stop on 03/27/22 (4 weeks), but to continue at least until seen in [facility] as outpatient.   Plavix to stop after 1 month (03/31/22) per [facility] plan.  ASA 81 mg and Apixaban to continue.  Time spent performing this drug regimen review (minutes):  [address]  Dennie Fetters, [place] 03/21/2022 3:36 PM

## 2022-03-21 NOTE — Plan of Care (Signed)
  Problem: RH Bathing Goal: LTG Patient will bathe all body parts with assist levels (OT) Description: LTG: Patient will bathe all body parts with assist levels (OT) Outcome: Adequate for Discharge   Problem: RH Balance Goal: LTG Patient will maintain dynamic sitting balance (PT) Description: LTG:  Patient will maintain dynamic sitting balance with assistance during mobility activities (PT) Outcome: Adequate for Discharge Goal: LTG Patient will maintain dynamic standing balance (PT) Description: LTG:  Patient will maintain dynamic standing balance with assistance during mobility activities (PT) Outcome: Adequate for Discharge   Problem: Sit to Stand Goal: LTG:  Patient will perform sit to stand with assistance level (PT) Description: LTG:  Patient will perform sit to stand with assistance level (PT) Outcome: Adequate for Discharge

## 2022-03-26 ENCOUNTER — Encounter: Payer: Medicaid Other | Admitting: Vascular Surgery

## 2022-03-28 ENCOUNTER — Other Ambulatory Visit (HOSPITAL_COMMUNITY): Payer: Self-pay

## 2022-03-29 ENCOUNTER — Encounter: Payer: Self-pay | Admitting: Infectious Diseases

## 2022-03-29 ENCOUNTER — Other Ambulatory Visit: Payer: Self-pay

## 2022-03-29 ENCOUNTER — Ambulatory Visit (INDEPENDENT_AMBULATORY_CARE_PROVIDER_SITE_OTHER): Payer: Medicare Other | Admitting: Infectious Diseases

## 2022-03-29 VITALS — BP 96/61 | HR 85 | Temp 97.6°F | Resp 16 | Ht 76.0 in

## 2022-03-29 DIAGNOSIS — L089 Local infection of the skin and subcutaneous tissue, unspecified: Secondary | ICD-10-CM | POA: Diagnosis not present

## 2022-03-29 DIAGNOSIS — M86171 Other acute osteomyelitis, right ankle and foot: Secondary | ICD-10-CM

## 2022-03-29 DIAGNOSIS — E11628 Type 2 diabetes mellitus with other skin complications: Secondary | ICD-10-CM | POA: Insufficient documentation

## 2022-03-29 DIAGNOSIS — Z5181 Encounter for therapeutic drug level monitoring: Secondary | ICD-10-CM | POA: Diagnosis not present

## 2022-03-29 MED ORDER — AMOXICILLIN-POT CLAVULANATE 875-125 MG PO TABS
1.0000 | ORAL_TABLET | Freq: Two times a day (BID) | ORAL | 0 refills | Status: DC
Start: 2022-03-29 — End: 2022-04-06

## 2022-03-29 NOTE — Progress Notes (Signed)
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 ([place]) 03/08/2022   Altered mental status    Osteomyelitis of fifth toe of right foot ([place])    Diabetic foot ulcer associated with type 2 diabetes mellitus ([place]) 02/16/2022   Atrial fibrillation, chronic ([place]) 02/16/2022   Infection of left hand 09/06/2021   Pressure injury of coccygeal region, stage 1 09/06/2021   Coronary artery disease    Peripheral vascular disease ([place])    Lower limb ulcer, calf ([place]) 07/13/2021   Acute osteomyelitis of right foot ([place]) 03/30/2021   Diabetes mellitus type 2, uncomplicated ([place]) 80/99/8338   Atherosclerosis of native arteries of the extremities with ulceration ([place]) 01/01/2019   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   ACIDOPHILUS (RISAQUAD) CAPS CAPSULE    Take 1 capsule by mouth daily after supper.   AMOXICILLIN-CLAVULANATE (AUGMENTIN) 875-125 MG TABLET    Take 1 tablet by mouth 2 (two) times daily for 10 days.   APIXABAN (ELIQUIS) 5 MG TABS TABLET    Take 1 tablet (5 mg total) by mouth 2 (two) times daily.   ASCORBIC ACID (VITAMIN C) 500 MG TABLET    Take 1 tablet (500 mg total) by mouth 2 (two) times daily.   ASPIRIN EC 81 MG TABLET    Take 1 tablet (81 mg total) by mouth daily.   ATORVASTATIN (LIPITOR) 80 MG TABLET    Take 1 tablet (80 mg total) by mouth daily at 6 PM.   BLOOD GLUCOSE  MONITORING SUPPL (ACCU-CHEK GUIDE) W/DEVICE KIT    5 (five) times daily.   CLOPIDOGREL (PLAVIX) 75 MG TABLET    Take 1 tablet (75 mg total) by mouth daily at 6 (six) AM.   INSULIN GLARGINE (LANTUS) 100 UNIT/ML SOLOSTAR PEN    Inject 41 Units into the skin 2 (two) times daily.   INSULIN LISPRO (HUMALOG) 100 UNIT/ML INJECTION    Inject 13 Units into the skin 3 (three) times daily before meals.   INSULIN LISPRO W/ TRANS PORT 100 UNIT/ML SOPN    24 Units 3 (three) times daily.   INSULIN PEN NEEDLE 32G X 4 MM MISC    Use 4 (four) times daily -  before meals and at bedtime.   LEPTOSPERMUM MANUKA HONEY (MEDIHONEY) PSTE PASTE    Apply 1 Application topically daily.   LOSARTAN (COZAAR) 25 MG TABLET    Take 1 tablet (25 mg total) by mouth daily.   MAGNESIUM OXIDE (MAG-OX) 400 MG TABLET    Take 0.5 tablets (200 mg total) by mouth daily.   METHOCARBAMOL (ROBAXIN) 500 MG TABLET    Take 1 tablet (500 mg total) by mouth every 6 (six) hours as needed for muscle spasms.   OXYCODONE (OXY IR/ROXICODONE) 5 MG IMMEDIATE RELEASE TABLET    Take 1 tablet (5 mg total) by mouth 2 (two) times daily as needed for severe pain.  PANTOPRAZOLE (PROTONIX) 40 MG TABLET    Take 1 tablet (40 mg total) by mouth daily.   POLYETHYLENE GLYCOL POWDER (GLYCOLAX/MIRALAX) 17 GM/SCOOP POWDER    Take 17 g by mouth daily.   TRULICITY 9.97 FS/1.4EL SOPN    Inject into the skin.   VITAMIN D, ERGOCALCIFEROL, (DRISDOL) 1.25 MG (50000 UNIT) CAPS CAPSULE    Take 1 capsule (50,000 Units total) by mouth every 7 (seven) days.   ZINC SULFATE 220 (50 ZN) MG TABS    Take 1 tablet (220 mg total) by mouth daily after supper.  Modified Medications   No medications on file  Discontinued Medications   INSULIN ASPART (NOVOLOG) 100 UNIT/ML INJECTION    Inject 13 Units into the skin 3 (three) times daily with meals.    Subjective: 66 Y O male with h/o CAD, DM2, HLD, HTN,  PVD, DVT, GERD, s/p left BKA who is here for HFU for rt foot osteomyelitis. Recently  admitted 6/15 where he underwent rt iliac artery stent 5/17 followed by rt common femoral artery to tibial peroneal trunk bypass with ipsilateral non reversed great saphenous vein on 5/22. This was followed by partial rt 5th ray amputation 5/24. OR cx grew Group B strep and MSSA. Path was positive for osteomyelitis. Patient was initially on ceftriaxone and metronidazole > unasyn > augmentin on discharge 6/15.   Accompanied by male friend. Taking augmentin twice a day as instructed. Denies missing doses.  Denies fevers, chills and sweats. Denies nausea, vomiting and diarrhea. Denies any pain/swelling/tenderness drainage at the rt foot surgical site. He has yet to follow up with Podaitry and [facility]VS. Discussed labs today and fu with podiatry ( still has stitches)  Review of Systems: all systems reviewed with pertinent positives and negatives as listed above   Past Medical History:  Diagnosis Date   Coronary artery disease    Diabetes ([place])    DVT (deep venous thrombosis) ([place])    Gangrene of left foot ([place]) 02/09/2018   GERD (gastroesophageal reflux disease)    Hyperlipidemia    Hypertension    Peripheral vascular disease ([place])    Past Surgical History:  Procedure Laterality Date   ABDOMINAL AORTOGRAM W/LOWER EXTREMITY N/A 02/20/2022   Procedure: ABDOMINAL AORTOGRAM W/LOWER EXTREMITY;  Surgeon: Marty Heck, MD;  Location: [facility];  Service: Cardiovascular;  Laterality: N/A;   AMPUTATION Left 10/28/2018   Procedure: AMPUTATION BELOW KNEE;  Surgeon: Algernon Huxley, MD;  Location: [facility];  Service: Vascular;  Laterality: Left;   AMPUTATION Left 09/10/2021   Procedure: AMPUTATION DIGIT;  Surgeon: Cindra Presume, MD;  [facility];  Service: Plastics;  Laterality: Left;   AMPUTATION Left 09/14/2021   Procedure: AMPUTATION DIGIT left long finger and irrigation and debridement;  Surgeon: Cindra Presume, MD;  [facility];  Service: Plastics;  Laterality: Left;   AMPUTATION  Right 02/27/2022   Procedure: RIGHT PARTIAL AMPUTATION FOOT;  Surgeon: Edrick Kins, DPM;  Location: [facility];  Service: Podiatry;  Laterality: Right;   AMPUTATION TOE Left 02/10/2018   Procedure: AMPUTATION TOE;  Surgeon: Sharlotte Alamo, DPM;  Location: [facility];  Service: Podiatry;  Laterality: Left;   APPLICATION OF WOUND VAC Left 10/16/2018   Procedure: APPLICATION OF WOUND VAC;  Surgeon: Samara Deist, DPM;  Location: [facility];  Service: Podiatry;  Laterality: Left;   DORSAL SLIT N/A 01/12/2021   Procedure: DORSAL SLIT;  Surgeon: Billey Co, MD;  Location: [facility];  Service: Urology;  Laterality: N/A;   FEMORAL-TIBIAL BYPASS GRAFT Right 02/25/2022   Procedure: RIGHT LOWER EXTREMITY FEMORAL TO TIBIAL PERONEAL TRUNK BYPASS;  Surgeon: Marty Heck, MD;  Location: [facility];  Service: Vascular;  Laterality: Right;  INSERT ARTERIAL LINE   groin surgery     IRRIGATION AND DEBRIDEMENT FOOT Left 09/30/2018   Procedure: IRRIGATION AND DEBRIDEMENT FOOT;  Surgeon: Sharlotte Alamo, DPM;  Location: [facility];  Service: Podiatry;  Laterality: Left;   LOWER EXTREMITY ANGIOGRAPHY Left 02/12/2018   Procedure: Lower Extremity Angiography;  Surgeon: Algernon Huxley, MD;  Location: [facility];  Service: Cardiovascular;  Laterality: Left;   LOWER EXTREMITY ANGIOGRAPHY Left 10/01/2018   Procedure: Lower Extremity Angiography;  Surgeon: Algernon Huxley, MD;  Location: [facility];  Service: Cardiovascular;  Laterality: Left;   LOWER EXTREMITY ANGIOGRAPHY Left 10/19/2018   Procedure: Lower Extremity Angiography;  Surgeon: Algernon Huxley, MD;  Location: [facility];  Service: Cardiovascular;  Laterality: Left;   LOWER EXTREMITY ANGIOGRAPHY Left 10/20/2018   Procedure: LOWER EXTREMITY ANGIOGRAPHY;  Surgeon: Algernon Huxley, MD;  Location: [facility];  Service: Cardiovascular;  Laterality: Left;   LOWER EXTREMITY ANGIOGRAPHY Right 11/25/2018   Procedure: LOWER EXTREMITY ANGIOGRAPHY;  Surgeon: Algernon Huxley, MD;  Location: [facility];  Service: Cardiovascular;  Laterality: Right;   LOWER EXTREMITY ANGIOGRAPHY Right 01/10/2020   Procedure: LOWER EXTREMITY ANGIOGRAPHY;  Surgeon: Algernon Huxley, MD;  Location: [facility];  Service: Cardiovascular;  Laterality: Right;   LOWER EXTREMITY ANGIOGRAPHY Right 12/11/2020   Procedure: LOWER EXTREMITY ANGIOGRAPHY;  Surgeon: Algernon Huxley, MD;  Location: [facility];  Service: Cardiovascular;  Laterality: Right;   LOWER EXTREMITY INTERVENTION  02/12/2018   Procedure: LOWER EXTREMITY INTERVENTION;  Surgeon: Algernon Huxley, MD;  Location: [facility];  Service: Cardiovascular;;   MINOR IRRIGATION AND DEBRIDEMENT OF WOUND Left 09/10/2021   Procedure: IRRIGATION AND DEBRIDEMENT OF LEFT LONG FINGER WOUND;  Surgeon: Cindra Presume, MD;  [facility];  Service: Plastics;  Laterality: Left;   NECK SURGERY     PERIPHERAL VASCULAR INTERVENTION  02/20/2022   Procedure: PERIPHERAL VASCULAR INTERVENTION;  Surgeon: Marty Heck, MD;  Location: [facility];  Service: Cardiovascular;;  right iliac   TRANSMETATARSAL AMPUTATION Left 10/16/2018   Procedure: TRANSMETATARSAL AMPUTATION LEFT FOOT;  Surgeon: Samara Deist, DPM;  Location: [facility];  Service: Podiatry;  Laterality: Left;   VEIN HARVEST Right 02/25/2022   Procedure: VEIN HARVEST OF RIGHT GREATER SAPHENOUS VEIN;  Surgeon: Marty Heck, MD;  [facility];  Service: Vascular;  Laterality: Right;     Social History   Tobacco Use   Smoking status: Former    Packs/day: 1.00    Years: 47.00    Total pack years: 47.00    Types: Cigarettes    Quit date: 01/2021    Years since quitting: 1.2   Smokeless tobacco: Never  Vaping Use   Vaping Use: Never used  Substance Use Topics   Alcohol use: No   Drug use: No    Family History  Problem Relation Age of Onset   Diabetes Mother    Coronary artery disease Father    Hypertension Sister    Leukemia  Brother     No Known Allergies  Health Maintenance  Topic Date Due   COVID-19 Vaccine (1) Never done   OPHTHALMOLOGY EXAM  Never done   TETANUS/TDAP  Never done   COLONOSCOPY (Pts 45-69yr Insurance coverage will need to be confirmed)  Never done   Zoster Vaccines- Shingrix (1 of 2) Never done   FOOT EXAM  10/15/2019   Pneumonia Vaccine 67 Years old (1 - PCV) Never done   INFLUENZA VACCINE  05/07/2022   HEMOGLOBIN A1C  08/20/2022   Hepatitis C Screening  Completed   HPV VACCINES  Aged Out    Objective:  Vitals:   03/29/22 1014  Resp: 16  Height: '6\' 4"'  (1.93 m)   Body mass index is 26.41 kg/m.  Physical Exam Constitutional:      Appearance: Normal appearance.  HENT:     Head: Normocephalic and atraumatic.      Mouth: Mucous membranes are moist.  Eyes:    Conjunctiva/sclera: Conjunctivae normal.     Pupils:  Cardiovascular:     Rate and Rhythm: Normal rate and regular rhythm.     Heart sounds:   Pulmonary:     Effort: Pulmonary effort is normal.     Breath sounds: Normal breath sounds.   Abdominal:     General: Non distended     Palpations: soft.   Musculoskeletal:        General: Normal range of motion.      Skin:    General: Skin is warm and dry.     Comments:  Neurological:     General: grossly non focal     Mental Status: awake, alert and oriented to person, place, and time.   Psychiatric:        Mood and Affect: Mood normal.   Lab Results Lab Results  Component Value Date   WBC 9.3 03/18/2022   HGB 11.7 (L) 03/18/2022   HCT 35.5 (L) 03/18/2022   MCV 83.3 03/18/2022   PLT 251 03/18/2022    Lab Results  Component Value Date   CREATININE 0.83 03/18/2022   BUN 17 03/18/2022   NA 140 03/18/2022   K 3.8 03/18/2022   CL 107 03/18/2022   CO2 25 03/18/2022    Lab Results  Component Value Date   ALT 32 03/09/2022   AST 22 03/09/2022   ALKPHOS 72 03/09/2022   BILITOT 0.2 (L) 03/09/2022    Lab Results  Component Value Date   CHOL  52 02/26/2022   HDL 15 (L) 02/26/2022   LDLCALC 24 02/26/2022   TRIG 65 02/26/2022   CHOLHDL 3.5 02/26/2022   No results found for: "LABRPR", "RPRTITER" No results found for: "HIV1RNAQUANT", "HIV1RNAVL", "CD4TABS"   Microbiology  Results for orders placed or performed during the hospital encounter of 02/15/22  Culture, blood (single)     Status: None   Collection Time: 02/15/22  9:27 PM   Specimen: BLOOD  Result Value Ref Range Status   Specimen Description BLOOD LEFT ANTECUBITAL  Final   Special Requests   Final    BOTTLES DRAWN AEROBIC AND ANAEROBIC Blood Culture results may not be optimal due to an excessive volume of blood received in culture bottles   Culture   Final    NO GROWTH 5 DAYS Performed at [facility] [address] [place] [address]   Report Status 02/20/2022 FINAL  Final  MRSA Next Gen by PCR, Nasal     Status: None   Collection Time: 02/19/22  8:50 AM   Specimen: Nasal Mucosa; Nasal Swab  Result Value Ref Range Status   MRSA by PCR Next Gen NOT DETECTED NOT DETECTED Final  Comment: (NOTE) The GeneXpert MRSA Assay (FDA approved for NASAL specimens only), is one component of a comprehensive MRSA colonization surveillance program. It is not intended to diagnose MRSA infection nor to guide or monitor treatment for MRSA infections. Test performance is not FDA approved in patients less than 70 years old. Performed at [facility], [place] [address], [place], [place] [address]   Surgical PCR screen     Status: None   Collection Time: 02/20/22  4:12 AM   Specimen: Nasal Mucosa; Nasal Swab  Result Value Ref Range Status   MRSA, PCR NEGATIVE NEGATIVE Final   Staphylococcus aureus NEGATIVE NEGATIVE Final    Comment: (NOTE) The Xpert SA Assay (FDA approved for NASAL specimens in patients 60 years of age and older), is one component of a comprehensive surveillance program. It is not intended to diagnose infection nor to guide or monitor  treatment. Performed at [facility], [place] [address] [place], [place] [address]   Culture, blood (Routine X 2) w Reflex to ID Panel     Status: None   Collection Time: 02/26/22 12:22 PM   Specimen: BLOOD RIGHT HAND  Result Value Ref Range Status   Specimen Description BLOOD RIGHT HAND  Final   Special Requests   Final    AEROBIC BOTTLE ONLY Blood Culture results may not be optimal due to an inadequate volume of blood received in culture bottles   Culture   Final    NO GROWTH 5 DAYS Performed at [facility], [place] [address] [place], [place] [address]    Report Status 03/03/2022 FINAL  Final  Culture, blood (Routine X 2) w Reflex to ID Panel     Status: None   Collection Time: 02/26/22 12:25 PM   Specimen: BLOOD RIGHT HAND  Result Value Ref Range Status   Specimen Description BLOOD RIGHT HAND  Final   Special Requests   Final    AEROBIC BOTTLE ONLY Blood Culture results may not be optimal due to an inadequate volume of blood received in culture bottles   Culture   Final    NO GROWTH 5 DAYS Performed at [facility], [place] [address] [place], [place] [address]    Report Status 03/03/2022 FINAL  Final  Aerobic/Anaerobic Culture w Gram Stain (surgical/deep wound)     Status: None   Collection Time: 02/27/22  6:19 PM   Specimen: PATH Digit amputation; Tissue  Result Value Ref Range Status   Specimen Description BONE  Final   Special Requests RIGHT FIFTH TOE  Final   Gram Stain NO WBC SEEN NO ORGANISMS SEEN   Final   Culture   Final    RARE GROUP B STREP(S.AGALACTIAE)ISOLATED TESTING AGAINST S. AGALACTIAE NOT ROUTINELY PERFORMED DUE TO PREDICTABILITY OF AMP/PEN/VAN SUSCEPTIBILITY. RARE STAPHYLOCOCCUS AUREUS NO ANAEROBES ISOLATED Performed at [facility], [place] [address] [place], [place] [address]    Report Status 03/05/2022 FINAL  Final   Organism ID, Bacteria STAPHYLOCOCCUS AUREUS  Final      Susceptibility   Staphylococcus aureus - MIC*     CIPROFLOXACIN <=0.5 SENSITIVE Sensitive     ERYTHROMYCIN <=0.25 SENSITIVE Sensitive     GENTAMICIN <=0.5 SENSITIVE Sensitive     OXACILLIN <=0.25 SENSITIVE Sensitive     TETRACYCLINE <=1 SENSITIVE Sensitive     VANCOMYCIN 1 SENSITIVE Sensitive     TRIMETH/SULFA <=10 SENSITIVE Sensitive     CLINDAMYCIN <=0.25 SENSITIVE Sensitive     RIFAMPIN <=0.5  SENSITIVE Sensitive     Inducible Clindamycin NEGATIVE Sensitive     * RARE STAPHYLOCOCCUS AUREUS   Pathology  FINAL MICROSCOPIC DIAGNOSIS:   A. TOE, RIGHT FIFTH, AMPUTATION:  - Osteomyelitis.  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 Pathology showed osteomyelitis. Cultures finalized with GBS and MSSA. Peripheral artery disease: Status post prior left BKA.  Underwent multiple vascular interventions in the past. Most recently Rt external iliac artery angioplasty with stent placement 5/17 followed by  right femoral to TP trunk bypass with reversed GSV on 5/22 Uncontrolled DM: Hgb A1c 11.3.   Continue Augmentin po bid as is to complete 6 weeks from OR date on 5/24. End date 04/10/22 Labs today for medication monitoring  Fu with Podiatry for post op wound care ( still has stitches) Fu with Vascular as instructed BG control per PCP Fu in 2 weeks, plan for possibly dc'ing antibiotics at that time  I have personally spent 50 minutes involved in face-to-face and non-face-to-face activities for this patient on the day of the visit. Professional time spent includes the following activities: Preparing to see the patient (review of tests), Obtaining and/or reviewing separately obtained history (admission/discharge record), Performing a medically appropriate examination and/or evaluation , Ordering medications/tests/procedures, referring and communicating with other health care professionals, Documenting clinical information in the EMR, Independently  interpreting results (not separately reported), Communicating results to the patient/family/caregiver, Counseling and educating the patient/family/caregiver and Care coordination (not separately reported).   Wilber Oliphant, [facility] 03/29/2022, 10:18 AM

## 2022-03-30 LAB — SEDIMENTATION RATE: Sed Rate: 36 mm/h — ABNORMAL HIGH (ref 0–20)

## 2022-03-30 LAB — COMPREHENSIVE METABOLIC PANEL
AG Ratio: 0.9 (calc) — ABNORMAL LOW (ref 1.0–2.5)
ALT: 18 U/L (ref 9–46)
AST: 18 U/L (ref 10–35)
Albumin: 3.6 g/dL (ref 3.6–5.1)
Alkaline phosphatase (APISO): 81 U/L (ref 35–144)
BUN: 13 mg/dL (ref 7–25)
CO2: 25 mmol/L (ref 20–32)
Calcium: 9.4 mg/dL (ref 8.6–10.3)
Chloride: 110 mmol/L (ref 98–110)
Creat: 1 mg/dL (ref 0.70–1.35)
Globulin: 3.8 g/dL (calc) — ABNORMAL HIGH (ref 1.9–3.7)
Glucose, Bld: 164 mg/dL — ABNORMAL HIGH (ref 65–99)
Potassium: 4.1 mmol/L (ref 3.5–5.3)
Sodium: 143 mmol/L (ref 135–146)
Total Bilirubin: 0.4 mg/dL (ref 0.2–1.2)
Total Protein: 7.4 g/dL (ref 6.1–8.1)

## 2022-03-30 LAB — CBC
HCT: 38.6 % (ref 38.5–50.0)
Hemoglobin: 12.9 g/dL — ABNORMAL LOW (ref 13.2–17.1)
MCH: 27.1 pg (ref 27.0–33.0)
MCHC: 33.4 g/dL (ref 32.0–36.0)
MCV: 81.1 fL (ref 80.0–100.0)
MPV: 9.6 fL (ref 7.5–12.5)
Platelets: 261 10*3/uL (ref 140–400)
RBC: 4.76 10*6/uL (ref 4.20–5.80)
RDW: 15.1 % — ABNORMAL HIGH (ref 11.0–15.0)
WBC: 10 10*3/uL (ref 3.8–10.8)

## 2022-03-30 LAB — C-REACTIVE PROTEIN: CRP: 11.6 mg/L — ABNORMAL HIGH (ref <8.0)

## 2022-04-04 ENCOUNTER — Other Ambulatory Visit (HOSPITAL_COMMUNITY): Payer: Self-pay

## 2022-04-04 ENCOUNTER — Telehealth (HOSPITAL_BASED_OUTPATIENT_CLINIC_OR_DEPARTMENT_OTHER): Payer: Self-pay

## 2022-04-04 NOTE — Telephone Encounter (Signed)
Pharmacy Transitions of Care Follow-up Telephone Call  Date of discharge: 03/21/22  Discharge Diagnosis: toe amputation  How have you been since you were released from the hospital? Patient doing well, no questions or concerns about his medications.   Medication changes made at discharge: START taking these medications  START taking these medications  acidophilus Caps capsule Take 1 capsule by mouth daily after supper.  Lantus SoloStar 100 UNIT/ML Solostar Pen Generic drug: insulin glargine Inject 41 Units into the skin 2 (two) times daily.  leptospermum manuka honey Pste paste Apply 1 Application topically daily.  magnesium oxide 400 MG tablet Commonly known as: MAG-OX Take 0.5 tablets (200 mg total) by mouth daily.  methocarbamol 500 MG tablet Commonly known as: ROBAXIN Take 1 tablet (500 mg total) by mouth every 6 (six) hours as needed for muscle spasms.  polyethylene glycol powder 17 GM/SCOOP powder Commonly known as: GLYCOLAX/MIRALAX Take 17 g by mouth daily.  vitamin C 500 MG tablet Commonly known as: ASCORBIC ACID Take 1 tablet (500 mg total) by mouth 2 (two) times daily.  Vitamin D (Ergocalciferol) 1.25 MG (50000 UNIT) Caps capsule Commonly known as: DRISDOL Take 1 capsule (50,000 Units total) by mouth every 7 (seven) days.  Zinc Sulfate 220 (50 Zn) MG Tabs Take 1 tablet (220 mg total) by mouth daily after supper.   CHANGE how you take these medications  CHANGE how you take these medications  oxyCODONE 5 MG immediate release tablet Commonly known as: Oxy IR/ROXICODONE Take 1 tablet (5 mg total) by mouth 2 (two) times daily as needed for severe pain. What changed:  when to take this reasons to take this  Pentips 32G X 4 MM Misc Generic drug: Insulin Pen Needle Use 4 (four) times daily - before meals and at bedtime. What changed:  medication strength how much to take how to take this when to take this   CONTINUE taking these medications  CONTINUE taking  these medications  Accu-Chek Guide test strip Generic drug: glucose blood  Accu-Chek Guide w/Device Kit  aspirin EC 81 MG tablet Take 1 tablet (81 mg total) by mouth daily.  atorvastatin 80 MG tablet Commonly known as: LIPITOR Take 1 tablet (80 mg total) by mouth daily at 6 PM.  clopidogrel 75 MG tablet Commonly known as: PLAVIX Take 1 tablet (75 mg total) by mouth daily at 6 (six) AM.  Eliquis 5 MG Tabs tablet Generic drug: apixaban Take 1 tablet (5 mg total) by mouth 2 (two) times daily. (Take 1 tablet (5 mg total) by mouth 2 (two) times daily.)  losartan 25 MG tablet Commonly known as: COZAAR Take 1 tablet (25 mg total) by mouth daily.  pantoprazole 40 MG tablet Commonly known as: PROTONIX Take 1 tablet (40 mg total) by mouth daily.   STOP taking these medications  STOP taking these medications  amoxicillin-clavulanate 875-125 MG tablet Commonly known as: AUGMENTIN  insulin aspart 100 UNIT/ML injection Commonly known as: novoLOG  insulin glargine-yfgn 100 UNIT/ML injection Commonly known as: SEMGLEE  traZODone 50 MG tablet Commonly known as: DESYREL    Medication changes verified by the patient? yes    Medication Accessibility:  Home Pharmacy[id number]   Was the patient provided with refills on discharged medications? no   Have all prescriptions been transferred from [facility] to home pharmacy? N/a   Is the patient able to afford medications? insured Notable copays: $0 Eligible patient assistance: no    Medication Review:  APIXABAN (ELIQUIS)  Apixaban 5 mg  BID- PTA Med - Discussed importance of taking medication around the same time everyday  - Reviewed potential DDIs with patient  - Advised patient of medications to avoid (NSAIDs, ASA)  - Educated that Tylenol (acetaminophen) will be the preferred analgesic to prevent risk of bleeding  - Emphasized importance of monitoring for signs and symptoms of bleeding (abnormal bruising, prolonged bleeding, nose bleeds,  bleeding from gums, discolored urine, black tarry stools)  - Advised patient to alert all providers of anticoagulation therapy prior to starting a new medication or having a procedure   Follow-up Appointments: Date Visit Type Length Department    04/12/2022 11:20 AM HOSPITAL FU 40 min [facility] [[id number]]  Patient Instructions:   Please arrive 15 minutes prior to your appointment.         04/16/2022  9:40 AM POST OP 20 min [facility] [[id number]]  Patient Instructions:            04/18/2022 10:00 AM OFFICE VISIT 15 min [facility] [[id number]]    If their condition worsens, is the pt aware to call PCP or go to the Emergency Dept.? yes  Final Patient Assessment: Patient doing well, no questions or concerns about his medications.    Darcus Austin, [facility] 04/04/2022 11:18 AM

## 2022-04-06 ENCOUNTER — Other Ambulatory Visit: Payer: Self-pay | Admitting: Infectious Diseases

## 2022-04-06 MED ORDER — AMOXICILLIN-POT CLAVULANATE 875-125 MG PO TABS: 1.0000 | ORAL_TABLET | Freq: Two times a day (BID) | ORAL | 0 refills | Status: AC

## 2022-04-06 NOTE — Addendum Note (Signed)
Addended by: Odette Fraction on: 04/06/2022 03:57 PM   Modules accepted: Orders

## 2022-04-12 ENCOUNTER — Encounter: Payer: Medicare Other | Attending: Physical Medicine & Rehabilitation | Admitting: Physical Medicine & Rehabilitation

## 2022-04-16 ENCOUNTER — Encounter: Payer: Medicaid Other | Admitting: Vascular Surgery

## 2022-04-18 ENCOUNTER — Ambulatory Visit (INDEPENDENT_AMBULATORY_CARE_PROVIDER_SITE_OTHER): Payer: Medicare Other | Admitting: Internal Medicine

## 2022-04-18 ENCOUNTER — Other Ambulatory Visit: Payer: Self-pay

## 2022-04-18 ENCOUNTER — Other Ambulatory Visit (HOSPITAL_COMMUNITY): Payer: Self-pay

## 2022-04-18 ENCOUNTER — Encounter: Payer: Self-pay | Admitting: Internal Medicine

## 2022-04-18 VITALS — BP 116/74 | HR 83 | Temp 97.6°F

## 2022-04-18 DIAGNOSIS — L089 Local infection of the skin and subcutaneous tissue, unspecified: Secondary | ICD-10-CM | POA: Diagnosis not present

## 2022-04-18 DIAGNOSIS — E11628 Type 2 diabetes mellitus with other skin complications: Secondary | ICD-10-CM | POA: Diagnosis not present

## 2022-04-18 DIAGNOSIS — M869 Osteomyelitis, unspecified: Secondary | ICD-10-CM

## 2022-04-18 NOTE — Patient Instructions (Signed)
Will check inflammation level   Follow up with your podiatrist to take your staples out   No more abx for now  Will see you again in around 3-4 weeks

## 2022-04-18 NOTE — Progress Notes (Signed)
Subjective: Patient is here for f/u on diabetic foot infection/om  He still has staples on the right foot He last finished abx of 6 weeks a week prior to this visit   He denies n/v/diarrhea No fever/chill  Appetite is good   He will see podiatry again on 7/17   He is wearing a hardsole shoe and not putting much weight on the right foot    Review of Systems: all systems reviewed with pertinent positives and negatives as listed above   Past Medical History:  Diagnosis Date   Coronary artery disease    Diabetes (HCC)    DVT (deep venous thrombosis) (HCC)    Gangrene of left foot (HCC) 02/09/2018   GERD (gastroesophageal reflux disease)    Hyperlipidemia    Hypertension    Peripheral vascular disease (HCC)    Past Surgical History:  Procedure Laterality Date   ABDOMINAL AORTOGRAM W/LOWER EXTREMITY N/A 02/20/2022   Procedure: ABDOMINAL AORTOGRAM W/LOWER EXTREMITY;  Surgeon: Cephus Shelling, MD;  [facility];  Service: Cardiovascular;  Laterality: N/A;   AMPUTATION Left 10/28/2018   Procedure: AMPUTATION BELOW KNEE;  Surgeon: Annice Needy, MD;  Location: [facility];  Service: Vascular;  Laterality: Left;   AMPUTATION Left 09/10/2021   Procedure: AMPUTATION DIGIT;  Surgeon: Allena Napoleon, MD;  Location: [facility];  Service: Plastics;  Laterality: Left;   AMPUTATION Left 09/14/2021   Procedure: AMPUTATION DIGIT left long finger and irrigation and debridement;  Surgeon: Allena Napoleon, MD;  Location: [facility];  Service: Plastics;  Laterality: Left;   AMPUTATION Right 02/27/2022   Procedure: RIGHT PARTIAL AMPUTATION FOOT;  Surgeon: Felecia Shelling, DPM;  [facility];  Service: Podiatry;  Laterality: Right;   AMPUTATION TOE Left 02/10/2018   Procedure: AMPUTATION TOE;  Surgeon: Linus Galas, DPM;  Location: [facility];  Service: Podiatry;  Laterality: Left;   APPLICATION OF WOUND VAC Left 10/16/2018   Procedure: APPLICATION OF WOUND VAC;  Surgeon: Gwyneth Revels,  DPM;  Location: [facility];  Service: Podiatry;  Laterality: Left;   DORSAL SLIT N/A 01/12/2021   Procedure: DORSAL SLIT;  Surgeon: Sondra Come, MD;  Location: [facility];  Service: Urology;  Laterality: N/A;   FEMORAL-TIBIAL BYPASS GRAFT Right 02/25/2022   Procedure: RIGHT LOWER EXTREMITY FEMORAL TO TIBIAL PERONEAL TRUNK BYPASS;  Surgeon: Cephus Shelling, MD;  [facility];  Service: Vascular;  Laterality: Right;  INSERT ARTERIAL LINE   groin surgery     IRRIGATION AND DEBRIDEMENT FOOT Left 09/30/2018   Procedure: IRRIGATION AND DEBRIDEMENT FOOT;  Surgeon: Linus Galas, DPM;  Location: [facility];  Service: Podiatry;  Laterality: Left;   LOWER EXTREMITY ANGIOGRAPHY Left 02/12/2018   Procedure: Lower Extremity Angiography;  Surgeon: Annice Needy, MD;  Location: [facility];  Service: Cardiovascular;  Laterality: Left;   LOWER EXTREMITY ANGIOGRAPHY Left 10/01/2018   Procedure: Lower Extremity Angiography;  Surgeon: Annice Needy, MD;  Location: [facility];  Service: Cardiovascular;  Laterality: Left;   LOWER EXTREMITY ANGIOGRAPHY Left 10/19/2018   Procedure: Lower Extremity Angiography;  Surgeon: Annice Needy, MD;  Location: [facility];  Service: Cardiovascular;  Laterality: Left;   LOWER EXTREMITY ANGIOGRAPHY Left 10/20/2018   Procedure: LOWER EXTREMITY ANGIOGRAPHY;  Surgeon: Annice Needy, MD;  Location: [facility];  Service: Cardiovascular;  Laterality: Left;   LOWER EXTREMITY ANGIOGRAPHY Right 11/25/2018   Procedure: LOWER EXTREMITY ANGIOGRAPHY;  Surgeon: Annice Needy, MD;  Location: [facility];  Service: Cardiovascular;  Laterality: Right;   LOWER EXTREMITY ANGIOGRAPHY Right 01/10/2020   Procedure: LOWER EXTREMITY ANGIOGRAPHY;  Surgeon: Annice Needy, MD;  Location: [facility];  Service: Cardiovascular;  Laterality: Right;   LOWER EXTREMITY ANGIOGRAPHY Right 12/11/2020   Procedure: LOWER EXTREMITY ANGIOGRAPHY;  Surgeon: Annice Needy, MD;   Location: [facility];  Service: Cardiovascular;  Laterality: Right;   LOWER EXTREMITY INTERVENTION  02/12/2018   Procedure: LOWER EXTREMITY INTERVENTION;  Surgeon: Annice Needy, MD;  Location: [facility];  Service: Cardiovascular;;   MINOR IRRIGATION AND DEBRIDEMENT OF WOUND Left 09/10/2021   Procedure: IRRIGATION AND DEBRIDEMENT OF LEFT LONG FINGER WOUND;  Surgeon: Allena Napoleon, MD;  [facility]: [facility];  Service: Plastics;  Laterality: Left;   NECK SURGERY     PERIPHERAL VASCULAR INTERVENTION  02/20/2022   Procedure: PERIPHERAL VASCULAR INTERVENTION;  Surgeon: Cephus Shelling, MD;  [facility];  Service: Cardiovascular;;  right iliac   TRANSMETATARSAL AMPUTATION Left 10/16/2018   Procedure: TRANSMETATARSAL AMPUTATION LEFT FOOT;  Surgeon: Gwyneth Revels, DPM;  Location: [facility];  Service: Podiatry;  Laterality: Left;   VEIN HARVEST Right 02/25/2022   Procedure: VEIN HARVEST OF RIGHT GREATER SAPHENOUS VEIN;  Surgeon: Cephus Shelling, MD;  [facility];  Service: Vascular;  Laterality: Right;     Social History   Tobacco Use   Smoking status: Former    Packs/day: 1.00    Years: 47.00    Total pack years: 47.00    Types: Cigarettes    Quit date: 01/2021    Years since quitting: 1.2   Smokeless tobacco: Never  Vaping Use   Vaping Use: Never used  Substance Use Topics   Alcohol use: No   Drug use: No    Family History  Problem Relation Age of Onset   Diabetes Mother    Coronary artery disease Father    Hypertension Sister    Leukemia Brother     No Known Allergies  Health Maintenance  Topic Date Due   COVID-19 Vaccine (1) Never done   OPHTHALMOLOGY EXAM  Never done   COLONOSCOPY (Pts 45-30yrs Insurance coverage will need to be confirmed)  Never done   Zoster Vaccines- Shingrix (1 of 2) Never done   TETANUS/TDAP  08/21/2019   FOOT EXAM  10/15/2019   Pneumonia Vaccine 33+ Years old (2 - PCV) 07/15/2020   INFLUENZA VACCINE   05/07/2022   HEMOGLOBIN A1C  08/20/2022   Hepatitis C Screening  Completed   HPV VACCINES  Aged Out    Objective:  Vitals:   04/18/22 0952  BP: 116/74  Pulse: 83  Temp: 97.6 F (36.4 C)  TempSrc: Oral  SpO2: 98%   There is no height or weight on file to calculate BMI.  Physical Exam General/constitutional: no distress, pleasant; in wheel chair; conversant HEENT: Normocephalic, PER, Conj Clear, EOMI, Oropharynx clear Neck supple CV: rrr no mrg Lungs: clear to auscultation, normal respiratory effort Abd: Soft, Nontender Ext: no edema Skin: No Rash Neuro: nonfocal MSK: left bka stump in prosthesis; right foot staples intact without dehiscence; slight edema RLE  7/13    Lab Results Lab Results  Component Value Date   WBC 10.0 03/29/2022   HGB 12.9 (L) 03/29/2022   HCT 38.6 03/29/2022   MCV 81.1 03/29/2022   PLT 261 03/29/2022    Lab Results  Component Value  Date   CREATININE 1.00 03/29/2022   BUN 13 03/29/2022   NA 143 03/29/2022   K 4.1 03/29/2022   CL 110 03/29/2022   CO2 25 03/29/2022    Lab Results  Component Value Date   ALT 18 03/29/2022   AST 18 03/29/2022   ALKPHOS 72 03/09/2022   BILITOT 0.4 03/29/2022    Lab Results  Component Value Date   CHOL 52 02/26/2022   HDL 15 (L) 02/26/2022   LDLCALC 24 02/26/2022   TRIG 65 02/26/2022   CHOLHDL 3.5 02/26/2022   No results found for: "LABRPR", "RPRTITER" No results found for: "HIV1RNAQUANT", "HIV1RNAVL", "CD4TABS"   Microbiology  Results for orders placed or performed during the hospital encounter of 02/15/22  Culture, blood (single)     Status: None   Collection Time: 02/15/22  9:27 PM   Specimen: BLOOD  Result Value Ref Range Status   Specimen Description BLOOD LEFT ANTECUBITAL  Final   Special Requests   Final    BOTTLES DRAWN AEROBIC AND ANAEROBIC Blood Culture results may not be optimal due to an excessive volume of blood received in culture bottles   Culture   Final    NO GROWTH 5  DAYS Performed at [facility], [address], [place], [place] [address]    Report Status 02/20/2022 FINAL  Final  MRSA Next Gen by PCR, Nasal     Status: None   Collection Time: 02/19/22  8:50 AM   Specimen: Nasal Mucosa; Nasal Swab  Result Value Ref Range Status   MRSA by PCR Next Gen NOT DETECTED NOT DETECTED Final    Comment: (NOTE) The GeneXpert MRSA Assay (FDA approved for NASAL specimens only), is one component of a comprehensive MRSA colonization surveillance program. It is not intended to diagnose MRSA infection nor to guide or monitor treatment for MRSA infections. Test performance is not FDA approved in patients less than 37 years old. Performed at [facility], [address], [place], [place] [address]   Surgical PCR screen     Status: None   Collection Time: 02/20/22  4:12 AM   Specimen: Nasal Mucosa; Nasal Swab  Result Value Ref Range Status   MRSA, PCR NEGATIVE NEGATIVE Final   Staphylococcus aureus NEGATIVE NEGATIVE Final    Comment: (NOTE) The Xpert SA Assay (FDA approved for NASAL specimens in patients 73 years of age and older), is one component of a comprehensive surveillance program. It is not intended to diagnose infection nor to guide or monitor treatment. Performed at [facility], [address], [place], [place] [address]   Culture, blood (Routine X 2) w Reflex to ID Panel     Status: None   Collection Time: 02/26/22 12:22 PM   Specimen: BLOOD RIGHT HAND  Result Value Ref Range Status   Specimen Description BLOOD RIGHT HAND  Final   Special Requests   Final    AEROBIC BOTTLE ONLY Blood Culture results may not be optimal due to an inadequate volume of blood received in culture bottles   Culture   Final    NO GROWTH 5 DAYS Performed at [facility], [address], [place], [place] [address]    Report Status 03/03/2022 FINAL  Final  Culture, blood (Routine X 2) w Reflex to ID Panel     Status: None   Collection Time:  02/26/22 12:25 PM   Specimen: BLOOD RIGHT HAND  Result Value Ref Range Status   Specimen  Description BLOOD RIGHT HAND  Final   Special Requests   Final    AEROBIC BOTTLE ONLY Blood Culture results may not be optimal due to an inadequate volume of blood received in culture bottles   Culture   Final    NO GROWTH 5 DAYS Performed at [facility], [address], [place], [place] [address]    Report Status 03/03/2022 FINAL  Final  Aerobic/Anaerobic Culture w Gram Stain (surgical/deep wound)     Status: None   Collection Time: 02/27/22  6:19 PM   Specimen: PATH Digit amputation; Tissue  Result Value Ref Range Status   Specimen Description BONE  Final   Special Requests RIGHT FIFTH TOE  Final   Gram Stain NO WBC SEEN NO ORGANISMS SEEN   Final   Culture   Final    RARE GROUP B STREP(S.AGALACTIAE)ISOLATED TESTING AGAINST S. AGALACTIAE NOT ROUTINELY PERFORMED DUE TO PREDICTABILITY OF AMP/PEN/VAN SUSCEPTIBILITY. RARE STAPHYLOCOCCUS AUREUS NO ANAEROBES ISOLATED Performed at [facility], [address] [place], [place] [address]    Report Status 03/05/2022 FINAL  Final   Organism ID, Bacteria STAPHYLOCOCCUS AUREUS  Final      Susceptibility   Staphylococcus aureus - MIC*    CIPROFLOXACIN <=0.5 SENSITIVE Sensitive     ERYTHROMYCIN <=0.25 SENSITIVE Sensitive     GENTAMICIN <=0.5 SENSITIVE Sensitive     OXACILLIN <=0.25 SENSITIVE Sensitive     TETRACYCLINE <=1 SENSITIVE Sensitive     VANCOMYCIN 1 SENSITIVE Sensitive     TRIMETH/SULFA <=10 SENSITIVE Sensitive     CLINDAMYCIN <=0.25 SENSITIVE Sensitive     RIFAMPIN <=0.5 SENSITIVE Sensitive     Inducible Clindamycin NEGATIVE Sensitive     * RARE STAPHYLOCOCCUS AUREUS   Pathology  FINAL MICROSCOPIC DIAGNOSIS:   A. TOE, RIGHT FIFTH, AMPUTATION:  - Osteomyelitis.  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 Pathology showed  osteomyelitis. Cultures finalized with GBS and MSSA. Peripheral artery disease: Status post prior left BKA.  Underwent multiple vascular interventions in the past. Most recently Rt external iliac artery angioplasty with stent placement 5/17 followed by  right femoral to TP trunk bypass with reversed GSV on 5/22 Uncontrolled DM: Hgb A1c 11.3.   Continue Augmentin po bid as is to complete 6 weeks from OR date on 5/24. End date 04/10/22 Labs today for medication monitoring  Fu with Podiatry for post op wound care ( still has stitches) Fu with Vascular as instructed BG control per PCP Fu in 2 weeks, plan for possibly dc'ing antibiotics at that time   04/18/22 assessment Seems to be doing well Incision intact Infection should be treated by now with the 6 weeks of abx; high risk recurrence though in setting diabetes and pvd. And if still active infection likely needs more surgery  Labs today   Follow up in 3-4 weeks to make sure no relapse or other intervention needed    Raymondo Band, MD [facility] 04/18/2022, 10:18 AM

## 2022-04-19 LAB — CBC
HCT: 39.9 % (ref 38.5–50.0)
Hemoglobin: 12.8 g/dL — ABNORMAL LOW (ref 13.2–17.1)
MCH: 26.7 pg — ABNORMAL LOW (ref 27.0–33.0)
MCHC: 32.1 g/dL (ref 32.0–36.0)
MCV: 83.3 fL (ref 80.0–100.0)
MPV: 9.5 fL (ref 7.5–12.5)
Platelets: 290 10*3/uL (ref 140–400)
RBC: 4.79 10*6/uL (ref 4.20–5.80)
RDW: 15.3 % — ABNORMAL HIGH (ref 11.0–15.0)
WBC: 10.2 10*3/uL (ref 3.8–10.8)

## 2022-04-19 LAB — COMPLETE METABOLIC PANEL WITH GFR
AG Ratio: 1.1 (calc) (ref 1.0–2.5)
ALT: 19 U/L (ref 9–46)
AST: 14 U/L (ref 10–35)
Albumin: 3.8 g/dL (ref 3.6–5.1)
Alkaline phosphatase (APISO): 83 U/L (ref 35–144)
BUN: 13 mg/dL (ref 7–25)
CO2: 28 mmol/L (ref 20–32)
Calcium: 9.7 mg/dL (ref 8.6–10.3)
Chloride: 108 mmol/L (ref 98–110)
Creat: 1.05 mg/dL (ref 0.70–1.35)
Globulin: 3.5 g/dL (calc) (ref 1.9–3.7)
Glucose, Bld: 191 mg/dL — ABNORMAL HIGH (ref 65–99)
Potassium: 4.4 mmol/L (ref 3.5–5.3)
Sodium: 141 mmol/L (ref 135–146)
Total Bilirubin: 0.5 mg/dL (ref 0.2–1.2)
Total Protein: 7.3 g/dL (ref 6.1–8.1)
eGFR: 78 mL/min/{1.73_m2} (ref >=60)

## 2022-04-19 LAB — C-REACTIVE PROTEIN: CRP: 4.4 mg/L (ref <8.0)

## 2022-04-22 ENCOUNTER — Ambulatory Visit (INDEPENDENT_AMBULATORY_CARE_PROVIDER_SITE_OTHER): Payer: Medicare Other

## 2022-04-22 ENCOUNTER — Ambulatory Visit (INDEPENDENT_AMBULATORY_CARE_PROVIDER_SITE_OTHER): Payer: Medicare Other | Admitting: Podiatry

## 2022-04-22 DIAGNOSIS — Z9889 Other specified postprocedural states: Secondary | ICD-10-CM

## 2022-04-22 NOTE — Progress Notes (Signed)
Chief Complaint  Patient presents with   Post-op Follow-up    Post op    Subjective:  Patient presents today status post partial fifth ray amputation RT foot with a past surgical history of below-knee amputation LT lower extremity. DOS: 02/27/2022 RT fifth ray amputation.  Patient doing well.  He has not been seen since surgery.  He has been having a nurse coming out to the house 2 times per week for dressing changes.  He presents for further treatment and evaluation  Past Medical History:  Diagnosis Date   Coronary artery disease    Diabetes (HCC)    DVT (deep venous thrombosis) (HCC)    Gangrene of left foot (HCC) 02/09/2018   GERD (gastroesophageal reflux disease)    Hyperlipidemia    Hypertension    Peripheral vascular disease (HCC)      Past Surgical History:  Procedure Laterality Date   ABDOMINAL AORTOGRAM W/LOWER EXTREMITY N/A 02/20/2022   Procedure: ABDOMINAL AORTOGRAM W/LOWER EXTREMITY;  Surgeon: Cephus Shelling, MD;  [facility];  Service: Cardiovascular;  Laterality: N/A;   AMPUTATION Left 10/28/2018   Procedure: AMPUTATION BELOW KNEE;  Surgeon: Annice Needy, MD;  Location: [facility];  Service: Vascular;  Laterality: Left;   AMPUTATION Left 09/10/2021   Procedure: AMPUTATION DIGIT;  Surgeon: Allena Napoleon, MD;  [facility]: [facility];  Service: Plastics;  Laterality: Left;   AMPUTATION Left 09/14/2021   Procedure: AMPUTATION DIGIT left long finger and irrigation and debridement;  Surgeon: Allena Napoleon, MD;  Location: [facility];  Service: Plastics;  Laterality: Left;   AMPUTATION Right 02/27/2022   Procedure: RIGHT PARTIAL AMPUTATION FOOT;  Surgeon: Felecia Shelling, DPM;  [facility];  Service: Podiatry;  Laterality: Right;   AMPUTATION TOE Left 02/10/2018   Procedure: AMPUTATION TOE;  Surgeon: Linus Galas, DPM;  Location: [facility];  Service: Podiatry;  Laterality: Left;   APPLICATION OF WOUND VAC Left 10/16/2018   Procedure: APPLICATION OF WOUND VAC;   Surgeon: Gwyneth Revels, DPM;  Location: [facility];  Service: Podiatry;  Laterality: Left;   DORSAL SLIT N/A 01/12/2021   Procedure: DORSAL SLIT;  Surgeon: Sondra Come, MD;  Location: [facility];  Service: Urology;  Laterality: N/A;   FEMORAL-TIBIAL BYPASS GRAFT Right 02/25/2022   Procedure: RIGHT LOWER EXTREMITY FEMORAL TO TIBIAL PERONEAL TRUNK BYPASS;  Surgeon: Cephus Shelling, MD;  [facility];  Service: Vascular;  Laterality: Right;  INSERT ARTERIAL LINE   groin surgery     IRRIGATION AND DEBRIDEMENT FOOT Left 09/30/2018   Procedure: IRRIGATION AND DEBRIDEMENT FOOT;  Surgeon: Linus Galas, DPM;  Location: [facility];  Service: Podiatry;  Laterality: Left;   LOWER EXTREMITY ANGIOGRAPHY Left 02/12/2018   Procedure: Lower Extremity Angiography;  Surgeon: Annice Needy, MD;  Location: [facility];  Service: Cardiovascular;  Laterality: Left;   LOWER EXTREMITY ANGIOGRAPHY Left 10/01/2018   Procedure: Lower Extremity Angiography;  Surgeon: Annice Needy, MD;  Location: [facility];  Service: Cardiovascular;  Laterality: Left;   LOWER EXTREMITY ANGIOGRAPHY Left 10/19/2018   Procedure: Lower Extremity Angiography;  Surgeon: Annice Needy, MD;  Location: [facility];  Service: Cardiovascular;  Laterality: Left;   LOWER EXTREMITY ANGIOGRAPHY Left 10/20/2018   Procedure: LOWER EXTREMITY ANGIOGRAPHY;  Surgeon: Annice Needy, MD;  Location: [facility];  Service: Cardiovascular;  Laterality: Left;   LOWER EXTREMITY ANGIOGRAPHY Right 11/25/2018   Procedure: LOWER EXTREMITY ANGIOGRAPHY;  Surgeon:  Annice Needy, MD;  Location: [facility];  Service: Cardiovascular;  Laterality: Right;   LOWER EXTREMITY ANGIOGRAPHY Right 01/10/2020   Procedure: LOWER EXTREMITY ANGIOGRAPHY;  Surgeon: Annice Needy, MD;  Location: [facility];  Service: Cardiovascular;  Laterality: Right;   LOWER EXTREMITY ANGIOGRAPHY Right 12/11/2020   Procedure: LOWER EXTREMITY ANGIOGRAPHY;   Surgeon: Annice Needy, MD;  Location: [facility];  Service: Cardiovascular;  Laterality: Right;   LOWER EXTREMITY INTERVENTION  02/12/2018   Procedure: LOWER EXTREMITY INTERVENTION;  Surgeon: Annice Needy, MD;  Location: [facility];  Service: Cardiovascular;;   MINOR IRRIGATION AND DEBRIDEMENT OF WOUND Left 09/10/2021   Procedure: IRRIGATION AND DEBRIDEMENT OF LEFT LONG FINGER WOUND;  Surgeon: Allena Napoleon, MD;  Location: [facility];  Service: Plastics;  Laterality: Left;   NECK SURGERY     PERIPHERAL VASCULAR INTERVENTION  02/20/2022   Procedure: PERIPHERAL VASCULAR INTERVENTION;  Surgeon: Cephus Shelling, MD;  [facility];  Service: Cardiovascular;;  right iliac   TRANSMETATARSAL AMPUTATION Left 10/16/2018   Procedure: TRANSMETATARSAL AMPUTATION LEFT FOOT;  Surgeon: Gwyneth Revels, DPM;  Location: [facility];  Service: Podiatry;  Laterality: Left;   VEIN HARVEST Right 02/25/2022   Procedure: VEIN HARVEST OF RIGHT GREATER SAPHENOUS VEIN;  Surgeon: Cephus Shelling, MD;  [facility]: [facility];  Service: Vascular;  Laterality: Right;   No Known Allergies  Objective/Physical Exam Neurovascular status intact.  Skin incisions appear to be well coapted with sutures and staples intact. No sign of infectious process noted. No dehiscence. No active bleeding noted. Moderate edema noted to the surgical extremity.  Radiographic Exam:  Partial fifth ray amputation noted at the proximal portion of the fifth metatarsal RT foot.  No radiolucencies or gas within the tissue.  No cortical irregularities.  Assessment: 1. s/p partial fifth ray amputation RT foot. DOS: 02/27/2022   Plan of Care:  1. Patient was evaluated. X-rays reviewed 2.  Sutures and staples were removed today.  There was some slight bleeding along the proximal portion of the incision site 3.  Continue home health nurse dressing changes 4.  Patient may begin washing and showering getting foot wet  5.  Return  to clinic 4 weeks   Felecia Shelling, DPM [facility]  Dr. Felecia Shelling, DPM    [address] [place], [place] [address]                Office [phone]  Fax [phone]     Present

## 2022-04-23 ENCOUNTER — Ambulatory Visit (INDEPENDENT_AMBULATORY_CARE_PROVIDER_SITE_OTHER): Payer: Medicare Other | Admitting: Vascular Surgery

## 2022-04-23 ENCOUNTER — Encounter: Payer: Self-pay | Admitting: Vascular Surgery

## 2022-04-23 DIAGNOSIS — I70221 Atherosclerosis of native arteries of extremities with rest pain, right leg: Secondary | ICD-10-CM

## 2022-04-23 NOTE — Progress Notes (Signed)
Patient name: Austin [name] MRN: [id number] DOB: 04/30/1956 Sex: male  REASON FOR VISIT: Post-op  HPI: Austin [name] is a 66 y.o. male presents for postop check.  Patient underwent right common femoral to TP trunk bypass with vein on 02/25/2022 for CLI with a diabetic foot ulcer.  Patient then had a partial fifth ray amputation with podiatry.  Patient's toe amputation has healed and he saw podiatry yesterday.  He is now at home.  He has a previous left BKA.  All of his incisions have healed.  No new concerns today.  Past Medical History:  Diagnosis Date   Coronary artery disease    Diabetes ([place])    DVT (deep venous thrombosis) (HCC)    Gangrene of left foot ([place]) 02/09/2018   GERD (gastroesophageal reflux disease)    Hyperlipidemia    Hypertension    Peripheral vascular disease ([place])     Past Surgical History:  Procedure Laterality Date   ABDOMINAL AORTOGRAM W/LOWER EXTREMITY N/A 02/20/2022   Procedure: ABDOMINAL AORTOGRAM W/LOWER EXTREMITY;  Surgeon: Marty Heck, MD;  Location: [facility];  Service: Cardiovascular;  Laterality: N/A;   AMPUTATION Left 10/28/2018   Procedure: AMPUTATION BELOW KNEE;  Surgeon: Algernon Huxley, MD;  Location: [facility];  Service: Vascular;  Laterality: Left;   AMPUTATION Left 09/10/2021   Procedure: AMPUTATION DIGIT;  Surgeon: Cindra Presume, MD;  [facility];  Service: Plastics;  Laterality: Left;   AMPUTATION Left 09/14/2021   Procedure: AMPUTATION DIGIT left long finger and irrigation and debridement;  Surgeon: Cindra Presume, MD;  [facility];  Service: Plastics;  Laterality: Left;   AMPUTATION Right 02/27/2022   Procedure: RIGHT PARTIAL AMPUTATION FOOT;  Surgeon: Edrick Kins, DPM;  Location: [facility];  Service: Podiatry;  Laterality: Right;   AMPUTATION TOE Left 02/10/2018   Procedure: AMPUTATION TOE;  Surgeon: Sharlotte Alamo, DPM;  Location: [facility];  Service: Podiatry;  Laterality: Left;   APPLICATION OF WOUND VAC Left  10/16/2018   Procedure: APPLICATION OF WOUND VAC;  Surgeon: Samara Deist, DPM;  Location: [facility];  Service: Podiatry;  Laterality: Left;   DORSAL SLIT N/A 01/12/2021   Procedure: DORSAL SLIT;  Surgeon: Billey Co, MD;  Location: [facility];  Service: Urology;  Laterality: N/A;   FEMORAL-TIBIAL BYPASS GRAFT Right 02/25/2022   Procedure: RIGHT LOWER EXTREMITY FEMORAL TO TIBIAL PERONEAL TRUNK BYPASS;  Surgeon: Marty Heck, MD;  Location: [facility];  Service: Vascular;  Laterality: Right;  INSERT ARTERIAL LINE   groin surgery     IRRIGATION AND DEBRIDEMENT FOOT Left 09/30/2018   Procedure: IRRIGATION AND DEBRIDEMENT FOOT;  Surgeon: Sharlotte Alamo, DPM;  Location: [facility];  Service: Podiatry;  Laterality: Left;   LOWER EXTREMITY ANGIOGRAPHY Left 02/12/2018   Procedure: Lower Extremity Angiography;  Surgeon: Algernon Huxley, MD;  Location: [facility];  Service: Cardiovascular;  Laterality: Left;   LOWER EXTREMITY ANGIOGRAPHY Left 10/01/2018   Procedure: Lower Extremity Angiography;  Surgeon: Algernon Huxley, MD;  Location: [facility];  Service: Cardiovascular;  Laterality: Left;   LOWER EXTREMITY ANGIOGRAPHY Left 10/19/2018   Procedure: Lower Extremity Angiography;  Surgeon: Algernon Huxley, MD;  Location: [facility];  Service: Cardiovascular;  Laterality: Left;   LOWER EXTREMITY ANGIOGRAPHY Left 10/20/2018   Procedure: LOWER EXTREMITY ANGIOGRAPHY;  Surgeon: Algernon Huxley, MD;  Location: [facility];  Service: Cardiovascular;  Laterality: Left;   LOWER EXTREMITY ANGIOGRAPHY  Right 11/25/2018   Procedure: LOWER EXTREMITY ANGIOGRAPHY;  Surgeon: Algernon Huxley, MD;  Location: [facility];  Service: Cardiovascular;  Laterality: Right;   LOWER EXTREMITY ANGIOGRAPHY Right 01/10/2020   Procedure: LOWER EXTREMITY ANGIOGRAPHY;  Surgeon: Algernon Huxley, MD;  Location: [facility];  Service: Cardiovascular;  Laterality: Right;   LOWER EXTREMITY ANGIOGRAPHY Right 12/11/2020    Procedure: LOWER EXTREMITY ANGIOGRAPHY;  Surgeon: Algernon Huxley, MD;  Location: [place] [facility];  Service: Cardiovascular;  Laterality: Right;   LOWER EXTREMITY INTERVENTION  02/12/2018   Procedure: LOWER EXTREMITY INTERVENTION;  Surgeon: Algernon Huxley, MD;  Location: [facility];  Service: Cardiovascular;;   MINOR IRRIGATION AND DEBRIDEMENT OF WOUND Left 09/10/2021   Procedure: IRRIGATION AND DEBRIDEMENT OF LEFT LONG FINGER WOUND;  Surgeon: Cindra Presume, MD;  [facility];  Service: Plastics;  Laterality: Left;   NECK SURGERY     PERIPHERAL VASCULAR INTERVENTION  02/20/2022   Procedure: PERIPHERAL VASCULAR INTERVENTION;  Surgeon: Marty Heck, MD;  Location: [facility];  Service: Cardiovascular;;  right iliac   TRANSMETATARSAL AMPUTATION Left 10/16/2018   Procedure: TRANSMETATARSAL AMPUTATION LEFT FOOT;  Surgeon: Samara Deist, DPM;  Location: [facility];  Service: Podiatry;  Laterality: Left;   VEIN HARVEST Right 02/25/2022   Procedure: VEIN HARVEST OF RIGHT GREATER SAPHENOUS VEIN;  Surgeon: Marty Heck, MD;  [facility];  Service: Vascular;  Laterality: Right;    Family History  Problem Relation Age of Onset   Diabetes Mother    Coronary artery disease Father    Hypertension Sister    Leukemia Brother     SOCIAL HISTORY: Social History   Tobacco Use   Smoking status: Former    Packs/day: 1.00    Years: 47.00    Total pack years: 47.00    Types: Cigarettes    Quit date: 01/2021    Years since quitting: 1.2   Smokeless tobacco: Never  Substance Use Topics   Alcohol use: No    No Known Allergies  Current Outpatient Medications  Medication Sig Dispense Refill   ACCU-CHEK GUIDE test strip 5 (five) times daily.     acidophilus (RISAQUAD) CAPS capsule Take 1 capsule by mouth daily after supper. 60 capsule 1   apixaban (ELIQUIS) 5 MG TABS tablet Take 1 tablet (5 mg total) by mouth 2 (two) times daily. 60 tablet 0   ascorbic acid  (VITAMIN C) 500 MG tablet Take 1 tablet (500 mg total) by mouth 2 (two) times daily. 60 tablet 0   aspirin EC 81 MG tablet Take 1 tablet (81 mg total) by mouth daily. 150 tablet 2   atorvastatin (LIPITOR) 80 MG tablet Take 1 tablet (80 mg total) by mouth daily at 6 PM. 30 tablet 0   Blood Glucose Monitoring Suppl (ACCU-CHEK GUIDE) w/Device KIT 5 (five) times daily.     clopidogrel (PLAVIX) 75 MG tablet Take 1 tablet (75 mg total) by mouth daily at 6 (six) AM. 10 tablet 0   insulin glargine (LANTUS) 100 UNIT/ML Solostar Pen Inject 41 Units into the skin 2 (two) times daily. 15 mL 0   insulin lispro (HUMALOG) 100 UNIT/ML injection Inject 13 Units into the skin 3 (three) times daily before meals.     Insulin Lispro w/ Trans Port 100 UNIT/ML SOPN 24 Units 3 (three) times daily.     Insulin Pen Needle 32G X 4 MM MISC Use 4 (four) times daily -  before meals and at bedtime. 200 each 0   leptospermum manuka honey (MEDIHONEY) PSTE paste Apply 1 Application topically daily. 44 mL 1   losartan (COZAAR) 25 MG tablet Take 1 tablet (25 mg total) by mouth daily. 30 tablet 0   magnesium oxide (MAG-OX) 400 MG tablet Take 0.5 tablets (200 mg total) by mouth daily. 30 tablet 0   methocarbamol (ROBAXIN) 500 MG tablet Take 1 tablet (500 mg total) by mouth every 6 (six) hours as needed for muscle spasms. 15 tablet 0   oxyCODONE (OXY IR/ROXICODONE) 5 MG immediate release tablet Take 1 tablet (5 mg total) by mouth 2 (two) times daily as needed for severe pain. 14 tablet 0   pantoprazole (PROTONIX) 40 MG tablet Take 1 tablet (40 mg total) by mouth daily. 30 tablet 0   polyethylene glycol powder (GLYCOLAX/MIRALAX) 17 GM/SCOOP powder Take 17 g by mouth daily. 295 g 0   TRULICITY 1.88 CZ/6.6AY SOPN Inject into the skin.     Vitamin D, Ergocalciferol, (DRISDOL) 1.25 MG (50000 UNIT) CAPS capsule Take 1 capsule (50,000 Units total) by mouth every 7 (seven) days. 5 capsule 0   Zinc Sulfate 220 (50 Zn) MG TABS Take 1 tablet (220  mg total) by mouth daily after supper. 30 tablet 0   No current facility-administered medications for this visit.    REVIEW OF SYSTEMS:  '[X]'  denotes positive finding, '[ ]'  denotes negative finding Cardiac  Comments:  Chest pain or chest pressure:    Shortness of breath upon exertion:    Short of breath when lying flat:    Irregular heart rhythm:        Vascular    Pain in calf, thigh, or hip brought on by ambulation:    Pain in feet at night that wakes you up from your sleep:     Blood clot in your veins:    Leg swelling:         Pulmonary    Oxygen at home:    Productive cough:     Wheezing:         Neurologic    Sudden weakness in arms or legs:     Sudden numbness in arms or legs:     Sudden onset of difficulty speaking or slurred speech:    Temporary loss of vision in one eye:     Problems with dizziness:         Gastrointestinal    Blood in stool:     Vomited blood:         Genitourinary    Burning when urinating:     Blood in urine:        Psychiatric    Major depression:         Hematologic    Bleeding problems:    Problems with blood clotting too easily:        Skin    Rashes or ulcers:        Constitutional    Fever or chills:      PHYSICAL EXAM: Vitals:   04/23/22 1037  BP: (!) 145/81  Pulse: 72  Resp: 18  Temp: 97.9 F (36.6 C)  TempSrc: Temporal  SpO2: 97%  Weight: 220 lb (99.8 kg)  Height: '6\' 4"'  (1.93 m)    GENERAL: The patient is a well-nourished male, in no acute distress. The vital signs are documented above. CARDIAC: There is a regular rate and rhythm.  VASCULAR:  Right groin incision well-healed Right leg incisions  well-healed Palpable pulse in the right leg bypass tunneled through the saphenectomy incision Right DP brisk by Doppler Right fifth toe amputation healed  DATA:   N/A  Assessment/Plan:   67 yo M that presents for post-op check after right common femoral to TP trunk bypass with vein on 02/25/2022 for CLI with a  diabetic foot ulcer.  Patient then had a partial fifth ray amputation with podiatry.  He has an excellent pulse in the bypass as the vein graft is tunneled through the saphenectomy incision.  Brisk Doppler signals in the foot.  Fifth toe amputation is healed.  Very pleased with his progress.  Discussed aspirin statin for risk reduction.  We will see him in 6 months in the [facility] clinic with noninvasive imaging including a right leg arterial duplex and ABIs for surveillance.  Discussed he call with questions or concerns.   Marty Heck, MD [facility] Office: [phone]

## 2022-05-01 ENCOUNTER — Other Ambulatory Visit: Payer: Self-pay

## 2022-05-01 DIAGNOSIS — I7025 Atherosclerosis of native arteries of other extremities with ulceration: Secondary | ICD-10-CM

## 2022-05-01 DIAGNOSIS — I70221 Atherosclerosis of native arteries of extremities with rest pain, right leg: Secondary | ICD-10-CM

## 2022-05-01 DIAGNOSIS — I739 Peripheral vascular disease, unspecified: Secondary | ICD-10-CM

## 2022-05-20 ENCOUNTER — Ambulatory Visit: Payer: Medicare Other | Admitting: Podiatry

## 2022-06-04 ENCOUNTER — Ambulatory Visit: Payer: Medicare Other | Admitting: Internal Medicine

## 2022-06-25 ENCOUNTER — Ambulatory Visit (INDEPENDENT_AMBULATORY_CARE_PROVIDER_SITE_OTHER): Payer: Medicare Other | Admitting: Internal Medicine

## 2022-06-25 ENCOUNTER — Other Ambulatory Visit: Payer: Self-pay

## 2022-06-25 VITALS — BP 127/84 | HR 103 | Temp 98.1°F

## 2022-06-25 DIAGNOSIS — L089 Local infection of the skin and subcutaneous tissue, unspecified: Secondary | ICD-10-CM

## 2022-06-25 DIAGNOSIS — E11628 Type 2 diabetes mellitus with other skin complications: Secondary | ICD-10-CM

## 2022-06-25 NOTE — Progress Notes (Signed)
Subjective: Patient is here for f/u on diabetic foot infection/om  He still has staples on the right foot He last finished abx of 6 weeks a week prior to this visit   He denies n/v/diarrhea No fever/chill  Appetite is good   He will see podiatry again on 7/17   He is wearing a hardsole shoe and not putting much weight on the right foot  ---------- 06/25/22 id clinic f/u Doing well Foot healed Still smoking Doing home foot spa   Review of Systems: all systems reviewed with pertinent positives and negatives as listed above   Past Medical History:  Diagnosis Date   Coronary artery disease    Diabetes (HCC)    DVT (deep venous thrombosis) (HCC)    Gangrene of left foot (HCC) 02/09/2018   GERD (gastroesophageal reflux disease)    Hyperlipidemia    Hypertension    Peripheral vascular disease ([facility])    Past Surgical History:  Procedure Laterality Date   ABDOMINAL AORTOGRAM W/LOWER EXTREMITY N/A 02/20/2022   Procedure: ABDOMINAL AORTOGRAM W/LOWER EXTREMITY;  Surgeon: Cephus Shelling, MD;  [facility];  Service: Cardiovascular;  Laterality: N/A;   AMPUTATION Left 10/28/2018   Procedure: AMPUTATION BELOW KNEE;  Surgeon: Annice Needy, MD;  Location: [facility];  Service: Vascular;  Laterality: Left;   AMPUTATION Left 09/10/2021   Procedure: AMPUTATION DIGIT;  Surgeon: Allena Napoleon, MD;  [facility];  Service: Plastics;  Laterality: Left;   AMPUTATION Left 09/14/2021   Procedure: AMPUTATION DIGIT left long finger and irrigation and debridement;  Surgeon: Allena Napoleon, MD;  [facility];  Service: Plastics;  Laterality: Left;   AMPUTATION Right 02/27/2022   Procedure: RIGHT PARTIAL AMPUTATION FOOT;  Surgeon: Felecia Shelling, DPM;  [facility];  Service: Podiatry;  Laterality: Right;   AMPUTATION TOE Left 02/10/2018   Procedure: AMPUTATION TOE;  Surgeon: Linus Galas, DPM;  Location: [facility];  Service: Podiatry;  Laterality: Left;    APPLICATION OF WOUND VAC Left 10/16/2018   Procedure: APPLICATION OF WOUND VAC;  Surgeon: Gwyneth Revels, DPM;  Location: [facility];  Service: Podiatry;  Laterality: Left;   DORSAL SLIT N/A 01/12/2021   Procedure: DORSAL SLIT;  Surgeon: Sondra Come, MD;  Location: [facility];  Service: Urology;  Laterality: N/A;   FEMORAL-TIBIAL BYPASS GRAFT Right 02/25/2022   Procedure: RIGHT LOWER EXTREMITY FEMORAL TO TIBIAL PERONEAL TRUNK BYPASS;  Surgeon: Cephus Shelling, MD;  [facility];  Service: Vascular;  Laterality: Right;  INSERT ARTERIAL LINE   groin surgery     IRRIGATION AND DEBRIDEMENT FOOT Left 09/30/2018   Procedure: IRRIGATION AND DEBRIDEMENT FOOT;  Surgeon: Linus Galas, DPM;  Location: [facility];  Service: Podiatry;  Laterality: Left;   LOWER EXTREMITY ANGIOGRAPHY Left 02/12/2018   Procedure: Lower Extremity Angiography;  Surgeon: Annice Needy, MD;  [facility];  Service: Cardiovascular;  Laterality: Left;   LOWER EXTREMITY ANGIOGRAPHY Left 10/01/2018   Procedure: Lower Extremity Angiography;  Surgeon: Annice Needy, MD;  [facility];  Service: Cardiovascular;  Laterality: Left;   LOWER EXTREMITY ANGIOGRAPHY Left 10/19/2018   Procedure: Lower Extremity Angiography;  Surgeon: Annice Needy, MD;  [facility];  Service: Cardiovascular;  Laterality: Left;   LOWER EXTREMITY ANGIOGRAPHY Left 10/20/2018   Procedure: LOWER EXTREMITY ANGIOGRAPHY;  Surgeon: Annice Needy, MD;  [facility];  Service: Cardiovascular;  Laterality:  Left;   LOWER EXTREMITY ANGIOGRAPHY Right 11/25/2018   Procedure: LOWER EXTREMITY ANGIOGRAPHY;  Surgeon: Annice Needy, MD;  [facility];  Service: Cardiovascular;  Laterality: Right;   LOWER EXTREMITY ANGIOGRAPHY Right 01/10/2020   Procedure: LOWER EXTREMITY ANGIOGRAPHY;  Surgeon: Annice Needy, MD;  Location[facility];  Service: Cardiovascular;  Laterality: Right;   LOWER  EXTREMITY ANGIOGRAPHY Right 12/11/2020   Procedure: LOWER EXTREMITY ANGIOGRAPHY;  Surgeon: Annice Needy, MD;  [facility];  Service: Cardiovascular;  Laterality: Right;   LOWER EXTREMITY INTERVENTION  02/12/2018   Procedure: LOWER EXTREMITY INTERVENTION;  Surgeon: Annice Needy, MD;  [facility];  Service: Cardiovascular;;   MINOR IRRIGATION AND DEBRIDEMENT OF WOUND Left 09/10/2021   Procedure: IRRIGATION AND DEBRIDEMENT OF LEFT LONG FINGER WOUND;  Surgeon: Allena Napoleon, MD;  [facility];  Service: Plastics;  Laterality: Left;   NECK SURGERY     PERIPHERAL VASCULAR INTERVENTION  02/20/2022   Procedure: PERIPHERAL VASCULAR INTERVENTION;  Surgeon: Cephus Shelling, MD;  [facility];  Service: Cardiovascular;;  right iliac   TRANSMETATARSAL AMPUTATION Left 10/16/2018   Procedure: TRANSMETATARSAL AMPUTATION LEFT FOOT;  Surgeon: Gwyneth Revels, DPM;  Location: [facility];  Service: Podiatry;  Laterality: Left;   VEIN HARVEST Right 02/25/2022   Procedure: VEIN HARVEST OF RIGHT GREATER SAPHENOUS VEIN;  Surgeon: Cephus Shelling, MD;  [facility];  Service: Vascular;  Laterality: Right;     Social History   Tobacco Use   Smoking status: Former    Packs/day: 1.00    Years: 47.00    Total pack years: 47.00    Types: Cigarettes    Quit date: 01/2021    Years since quitting: 1.4   Smokeless tobacco: Never  Vaping Use   Vaping Use: Never used  Substance Use Topics   Alcohol use: No   Drug use: No    Family History  Problem Relation Age of Onset   Diabetes Mother    Coronary artery disease Father    Hypertension Sister    Leukemia Brother     No Known Allergies  Health Maintenance  Topic Date Due   COVID-19 Vaccine (1) Never done   OPHTHALMOLOGY EXAM  Never done   Diabetic kidney evaluation - Urine ACR  Never done   COLONOSCOPY (Pts 45-79yrs Insurance coverage will need to be confirmed)  Never done   Zoster Vaccines-  Shingrix (1 of 2) Never done   TETANUS/TDAP  08/21/2019   FOOT EXAM  10/15/2019   Pneumonia Vaccine 66+ Years old (2 - PCV) 07/15/2020   INFLUENZA VACCINE  05/07/2022   HEMOGLOBIN A1C  08/20/2022   Diabetic kidney evaluation - GFR measurement  04/19/2023   Hepatitis C Screening  Completed   HPV VACCINES  Aged Out    Objective:  Vitals:   06/25/22 1037  BP: 127/84  Pulse: (!) 103  Temp: 98.1 F (36.7 C)  TempSrc: Oral  SpO2: 98%   There is no height or weight on file to calculate BMI.  Physical Exam General/constitutional: no distress, pleasant HEENT: Normocephalic, PER, Conj Clear, EOMI, Oropharynx clear Neck supple CV: rrr no mrg Lungs: clear to auscultation, normal respiratory effort Abd: Soft, Nontender Ext: no edema Skin: No Rash Neuro: nonfocal  MSK: left bka stump in prosthesis; right foot incision completely healed no ulcer present  7/13     06/25/22 picture    Lab Results Lab  Results  Component Value Date   WBC 10.2 04/18/2022   HGB 12.8 (L) 04/18/2022   HCT 39.9 04/18/2022   MCV 83.3 04/18/2022   PLT 290 04/18/2022    Lab Results  Component Value Date   CREATININE 1.05 04/18/2022   BUN 13 04/18/2022   NA 141 04/18/2022   K 4.4 04/18/2022   CL 108 04/18/2022   CO2 28 04/18/2022    Lab Results  Component Value Date   ALT 19 04/18/2022   AST 14 04/18/2022   ALKPHOS 72 03/09/2022   BILITOT 0.5 04/18/2022    Lab Results  Component Value Date   CHOL 52 02/26/2022   HDL 15 (L) 02/26/2022   LDLCALC 24 02/26/2022   TRIG 65 02/26/2022   CHOLHDL 3.5 02/26/2022      Component Value Date/Time   CRP 4.4 04/18/2022 1034   CRP 11.6 (H) 03/29/2022 1048      Microbiology  Results for orders placed or performed during the hospital encounter of 02/15/22  Culture, blood (single)     Status: None   Collection Time: 02/15/22  9:27 PM   Specimen: BLOOD  Result Value Ref Range Status   Specimen Description BLOOD LEFT ANTECUBITAL  Final    Special Requests   Final    BOTTLES DRAWN AEROBIC AND ANAEROBIC Blood Culture results may not be optimal due to an excessive volume of blood received in culture bottles   Culture   Final    NO GROWTH 5 DAYS Performed at [facility], [address] [place], [place] [address]    Report Status 02/20/2022 FINAL  Final  MRSA Next Gen by PCR, Nasal     Status: None   Collection Time: 02/19/22  8:50 AM   Specimen: Nasal Mucosa; Nasal Swab  Result Value Ref Range Status   MRSA by PCR Next Gen NOT DETECTED NOT DETECTED Final    Comment: (NOTE) The GeneXpert MRSA Assay (FDA approved for NASAL specimens only), is one component of a comprehensive MRSA colonization surveillance program. It is not intended to diagnose MRSA infection nor to guide or monitor treatment for MRSA infections. Test performance is not FDA approved in patients less than 67 years old. Performed at [facility], [address] [place], [place] [address]   Surgical PCR screen     Status: None   Collection Time: 02/20/22  4:12 AM   Specimen: Nasal Mucosa; Nasal Swab  Result Value Ref Range Status   MRSA, PCR NEGATIVE NEGATIVE Final   Staphylococcus aureus NEGATIVE NEGATIVE Final    Comment: (NOTE) The Xpert SA Assay (FDA approved for NASAL specimens in patients 67 years of age and older), is one component of a comprehensive surveillance program. It is not intended to diagnose infection nor to guide or monitor treatment. Performed at [facility], [address] [place], [place] [address]   Culture, blood (Routine X 2) w Reflex to ID Panel     Status: None   Collection Time: 02/26/22 12:22 PM   Specimen: BLOOD RIGHT HAND  Result Value Ref Range Status   Specimen Description BLOOD RIGHT HAND  Final   Special Requests   Final    AEROBIC BOTTLE ONLY Blood Culture results may not be optimal due to an inadequate volume of blood received in culture bottles   Culture   Final    NO GROWTH 5  DAYS Performed at [facility], [address] [place], [place] [address]  Report Status 03/03/2022 FINAL  Final  Culture, blood (Routine X 2) w Reflex to ID Panel     Status: None   Collection Time: 02/26/22 12:25 PM   Specimen: BLOOD RIGHT HAND  Result Value Ref Range Status   Specimen Description BLOOD RIGHT HAND  Final   Special Requests   Final    AEROBIC BOTTLE ONLY Blood Culture results may not be optimal due to an inadequate volume of blood received in culture bottles   Culture   Final    NO GROWTH 5 DAYS Performed at [facility], [address], [place], [place] [address]    Report Status 03/03/2022 FINAL  Final  Aerobic/Anaerobic Culture w Gram Stain (surgical/deep wound)     Status: None   Collection Time: 02/27/22  6:19 PM   Specimen: PATH Digit amputation; Tissue  Result Value Ref Range Status   Specimen Description BONE  Final   Special Requests RIGHT FIFTH TOE  Final   Gram Stain NO WBC SEEN NO ORGANISMS SEEN   Final   Culture   Final    RARE GROUP B STREP(S.AGALACTIAE)ISOLATED TESTING AGAINST S. AGALACTIAE NOT ROUTINELY PERFORMED DUE TO PREDICTABILITY OF AMP/PEN/VAN SUSCEPTIBILITY. RARE STAPHYLOCOCCUS AUREUS NO ANAEROBES ISOLATED Performed at [facility], [address], [place], [place] [address]    Report Status 03/05/2022 FINAL  Final   Organism ID, Bacteria STAPHYLOCOCCUS AUREUS  Final      Susceptibility   Staphylococcus aureus - MIC*    CIPROFLOXACIN <=0.5 SENSITIVE Sensitive     ERYTHROMYCIN <=0.25 SENSITIVE Sensitive     GENTAMICIN <=0.5 SENSITIVE Sensitive     OXACILLIN <=0.25 SENSITIVE Sensitive     TETRACYCLINE <=1 SENSITIVE Sensitive     VANCOMYCIN 1 SENSITIVE Sensitive     TRIMETH/SULFA <=10 SENSITIVE Sensitive     CLINDAMYCIN <=0.25 SENSITIVE Sensitive     RIFAMPIN <=0.5 SENSITIVE Sensitive     Inducible Clindamycin NEGATIVE Sensitive     * RARE STAPHYLOCOCCUS AUREUS   Pathology  FINAL MICROSCOPIC DIAGNOSIS:   A.  TOE, RIGHT FIFTH, AMPUTATION:  - Osteomyelitis.  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 Pathology showed osteomyelitis. Cultures finalized with GBS and MSSA. Peripheral artery disease: Status post prior left BKA.  Underwent multiple vascular interventions in the past. Most recently Rt external iliac artery angioplasty with stent placement 5/17 followed by  right femoral to TP trunk bypass with reversed GSV on 5/22 Uncontrolled DM: Hgb A1c 11.3.   Continue Augmentin po bid as is to complete 6 weeks from OR date on 5/24. End date 04/10/22 Labs today for medication monitoring  Fu with Podiatry for post op wound care ( still has stitches) Fu with Vascular as instructed BG control per PCP Fu in 2 weeks, plan for possibly dc'ing antibiotics at that time   04/18/22 assessment Seems to be doing well Incision intact Infection should be treated by now with the 6 weeks of abx; high risk recurrence though in setting diabetes and pvd. And if still active infection likely needs more surgery  Labs today   Follow up in 3-4 weeks to make sure no relapse or other intervention needed  06/25/22 assessment Doing well off abx. No sign of infection Discuss need to stop smoking to reduce risk infection/vascular disease Advise ongoing periodic podiatry care for routine foot care  No need for id clinic f/u    Austin [name]  Darnelle Maffucci, MD [facility] 06/25/2022, 10:41 AM

## 2022-06-25 NOTE — Patient Instructions (Signed)
Your foot looks healthy now without sign of infection   I would advise to continue seeing your podiatrist once or twice a year for close monitoring of early infection   Also please consider stop smoking   I do not see a contraindication for spa home foot care - just avoid being rough on the foot and create cut/nick in skin   No need to follow up with our id clinic at this time

## 2022-08-21 ENCOUNTER — Ambulatory Visit
Admission: RE | Admit: 2022-08-21 | Discharge: 2022-08-21 | Disposition: A | Discharge status: Home / Self Care | Payer: Medicare Other | Source: Ambulatory Visit | Attending: Acute Care | Admitting: Acute Care

## 2022-08-21 DIAGNOSIS — J439 Emphysema, unspecified: Secondary | ICD-10-CM | POA: Insufficient documentation

## 2022-08-21 DIAGNOSIS — Z87891 Personal history of nicotine dependence: Secondary | ICD-10-CM | POA: Diagnosis present

## 2022-08-21 DIAGNOSIS — I700 Atherosclerosis of aorta: Secondary | ICD-10-CM | POA: Diagnosis not present

## 2022-08-21 DIAGNOSIS — Z122 Encounter for screening for malignant neoplasm of respiratory organs: Secondary | ICD-10-CM | POA: Diagnosis not present

## 2022-08-21 DIAGNOSIS — I2510 Atherosclerotic heart disease of native coronary artery without angina pectoris: Secondary | ICD-10-CM | POA: Insufficient documentation

## 2022-08-23 ENCOUNTER — Other Ambulatory Visit: Payer: Self-pay

## 2022-08-23 DIAGNOSIS — Z87891 Personal history of nicotine dependence: Secondary | ICD-10-CM

## 2022-08-23 DIAGNOSIS — F17210 Nicotine dependence, cigarettes, uncomplicated: Secondary | ICD-10-CM

## 2022-08-23 DIAGNOSIS — Z122 Encounter for screening for malignant neoplasm of respiratory organs: Secondary | ICD-10-CM

## 2023-01-06 ENCOUNTER — Ambulatory Visit: Payer: 59 | Attending: Family Medicine | Admitting: Physical Therapy

## 2023-01-08 ENCOUNTER — Ambulatory Visit: Payer: 59 | Admitting: Physical Therapy

## 2023-01-15 ENCOUNTER — Encounter: Payer: 59 | Admitting: Physical Therapy

## 2023-01-16 ENCOUNTER — Encounter: Payer: Self-pay | Admitting: Emergency Medicine

## 2023-01-16 ENCOUNTER — Ambulatory Visit
Admission: EM | Admit: 2023-01-16 | Discharge: 2023-01-16 | Disposition: A | Discharge status: Home / Self Care | Payer: 59 | Attending: Family Medicine | Admitting: Family Medicine

## 2023-01-16 ENCOUNTER — Ambulatory Visit (INDEPENDENT_AMBULATORY_CARE_PROVIDER_SITE_OTHER): Payer: 59

## 2023-01-16 DIAGNOSIS — R1030 Lower abdominal pain, unspecified: Secondary | ICD-10-CM

## 2023-01-16 DIAGNOSIS — M79604 Pain in right leg: Secondary | ICD-10-CM | POA: Diagnosis present

## 2023-01-16 DIAGNOSIS — M79605 Pain in left leg: Secondary | ICD-10-CM | POA: Diagnosis present

## 2023-01-16 LAB — COMPREHENSIVE METABOLIC PANEL
ALT: 19 U/L (ref 0–44)
AST: 18 U/L (ref 15–41)
Albumin: 3.8 g/dL (ref 3.5–5.0)
Alkaline Phosphatase: 85 U/L (ref 38–126)
Anion gap: 6 (ref 5–15)
BUN: 18 mg/dL (ref 8–23)
CO2: 26 mmol/L (ref 22–32)
Calcium: 9.4 mg/dL (ref 8.9–10.3)
Chloride: 105 mmol/L (ref 98–111)
Creatinine, Ser: 0.9 mg/dL (ref 0.61–1.24)
GFR, Estimated: >60 mL/min (ref >60)
Glucose, Bld: 236 mg/dL — ABNORMAL HIGH (ref 70–99)
Potassium: 4 mmol/L (ref 3.5–5.1)
Sodium: 137 mmol/L (ref 135–145)
Total Bilirubin: 0.4 mg/dL (ref 0.3–1.2)
Total Protein: 7.6 g/dL (ref 6.5–8.1)

## 2023-01-16 LAB — CBC WITH DIFFERENTIAL/PLATELET
Abs Immature Granulocytes: 0.04 10*3/uL (ref 0.00–0.07)
Basophils Absolute: 0 10*3/uL (ref 0.0–0.1)
Basophils Relative: 0 %
Eosinophils Absolute: 0.1 10*3/uL (ref 0.0–0.5)
Eosinophils Relative: 1 %
HCT: 44.1 % (ref 39.0–52.0)
Hemoglobin: 14.9 g/dL (ref 13.0–17.0)
Immature Granulocytes: 0 %
Lymphocytes Relative: 29 %
Lymphs Abs: 2.9 10*3/uL (ref 0.7–4.0)
MCH: 28.4 pg (ref 26.0–34.0)
MCHC: 33.8 g/dL (ref 30.0–36.0)
MCV: 84 fL (ref 80.0–100.0)
Monocytes Absolute: 0.7 10*3/uL (ref 0.1–1.0)
Monocytes Relative: 7 %
Neutro Abs: 6.4 10*3/uL (ref 1.7–7.7)
Neutrophils Relative %: 63 %
Platelets: 229 10*3/uL (ref 150–400)
RBC: 5.25 MIL/uL (ref 4.22–5.81)
RDW: 15.3 % (ref 11.5–15.5)
WBC: 10.1 10*3/uL (ref 4.0–10.5)
nRBC: 0 % (ref 0.0–0.2)

## 2023-01-16 LAB — LIPASE, BLOOD: Lipase: 28 U/L (ref 11–51)

## 2023-01-16 MED ORDER — POLYETHYLENE GLYCOL 3350 17 GM/SCOOP PO POWD: 34.0000 g | Freq: Every day | ORAL | 0 refills | Status: DC

## 2023-01-16 NOTE — ED Provider Notes (Signed)
[facility]    CSN: [id number] Arrival date & time: 01/16/23  1122      History   Chief Complaint Chief Complaint  Patient presents with   Abdominal Pain   Leg Pain    HPI LASH HOLLOMON is a 67 y.o. male.   HPI  Jaideep presents for constant abdominal pain that radiates to his groin  that started 2 months ago. No chest pain, acid reflux, shortness of breath. Has a slight cough.      He took a muscle relaxer and pain medication that didn't help. Has DM and had infected leg that needed amputation.   He starts physical therapy on May 2nd.   Past Surgeries: penile surgery   Symptoms Nausea/Vomiting: no  Diarrhea: no  Constipation: yes  Melena/BRBPR: no  Hematemesis: no  Anorexia: no  Fever/Chills: no  Dysuria: no  Rash: no  Wt loss: no  EtOH use: no  NSAIDs/ASA: yes ASA STD risk/hx: no  Sore throat: no   Cough: no Nasal congestion : no  Sleep disturbance: no Back Pain: no Headache: no   Past Medical History:  Diagnosis Date   Coronary artery disease    Diabetes    DVT (deep venous thrombosis)    Gangrene of left foot 02/09/2018   GERD (gastroesophageal reflux disease)    Hyperlipidemia    Hypertension    Peripheral vascular disease     Patient Active Problem List   Diagnosis Date Noted   Critical limb ischemia of right lower extremity 04/23/2022   Medication monitoring encounter 03/29/2022   Diabetic foot infection 03/29/2022   Acute blood loss anemia 03/21/2022   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 03/08/2022   Altered mental status    Osteomyelitis of fifth toe of right foot    Diabetic foot ulcer associated with type 2 diabetes mellitus 02/16/2022   Atrial fibrillation, chronic 02/16/2022   Infection of left hand 09/06/2021   Pressure injury of coccygeal region, stage 1 09/06/2021   Coronary artery disease    Peripheral vascular disease    Lower limb ulcer, calf 07/13/2021   Acute osteomyelitis of right  foot 03/30/2021   Diabetes mellitus type 2, uncomplicated 09/16/2019   Atherosclerosis of native arteries of the extremities with ulceration 01/01/2019   Status post below-knee amputation (HCC) - left side 11/30/2018   Type 2 diabetes mellitus with diabetic peripheral angiopathy without gangrene 10/14/2018   Malnutrition of moderate degree 10/02/2018   Foot ulcer 09/29/2018   Atherosclerosis of native arteries of extremity with intermittent claudication 05/25/2017   Essential hypertension 05/25/2017   Hyperlipidemia 05/25/2017    Past Surgical History:  Procedure Laterality Date   ABDOMINAL AORTOGRAM W/LOWER EXTREMITY N/A 02/20/2022   Procedure: ABDOMINAL AORTOGRAM W/LOWER EXTREMITY;  Surgeon: Cephus Shelling, MD;  Location: [facility];  Service: Cardiovascular;  Laterality: N/A;   AMPUTATION Left 10/28/2018   Procedure: AMPUTATION BELOW KNEE;  Surgeon: Annice Needy, MD;  Location: [facility];  Service: Vascular;  Laterality: Left;   AMPUTATION Left 09/10/2021   Procedure: AMPUTATION DIGIT;  Surgeon: Allena Napoleon, MD;  Location: [facility];  Service: Plastics;  Laterality: Left;   AMPUTATION Left 09/14/2021   Procedure: AMPUTATION DIGIT left long finger and irrigation and debridement;  Surgeon: Allena Napoleon, MD;  Location: [facility];  Service: Plastics;  Laterality: Left;   AMPUTATION Right 02/27/2022   Procedure: RIGHT PARTIAL AMPUTATION FOOT;  Surgeon: Felecia Shelling, DPM;  Location: [facility];  Service: Podiatry;  Laterality: Right;   AMPUTATION TOE Left 02/10/2018   Procedure: AMPUTATION TOE;  Surgeon: Linus Galas, DPM;  Location: [facility];  Service: Podiatry;  Laterality: Left;   APPLICATION OF WOUND VAC Left 10/16/2018   Procedure: APPLICATION OF WOUND VAC;  Surgeon: Gwyneth Revels, DPM;  Location: [facility];  Service: Podiatry;  Laterality: Left;   DORSAL SLIT N/A 01/12/2021   Procedure: DORSAL SLIT;  Surgeon: Sondra Come, MD;  Location: [facility];  Service: Urology;  Laterality:  N/A;   FEMORAL-TIBIAL BYPASS GRAFT Right 02/25/2022   Procedure: RIGHT LOWER EXTREMITY FEMORAL TO TIBIAL PERONEAL TRUNK BYPASS;  Surgeon: Cephus Shelling, MD;  Location: [facility];  Service: Vascular;  Laterality: Right;  INSERT ARTERIAL LINE   groin surgery     IRRIGATION AND DEBRIDEMENT FOOT Left 09/30/2018   Procedure: IRRIGATION AND DEBRIDEMENT FOOT;  Surgeon: Linus Galas, DPM;  Location: [facility];  Service: Podiatry;  Laterality: Left;   LOWER EXTREMITY ANGIOGRAPHY Left 02/12/2018   Procedure: Lower Extremity Angiography;  Surgeon: Annice Needy, MD;  Location: [facility];  Service: Cardiovascular;  Laterality: Left;   LOWER EXTREMITY ANGIOGRAPHY Left 10/01/2018   Procedure: Lower Extremity Angiography;  Surgeon: Annice Needy, MD;  Location: [facility];  Service: Cardiovascular;  Laterality: Left;   LOWER EXTREMITY ANGIOGRAPHY Left 10/19/2018   Procedure: Lower Extremity Angiography;  Surgeon: Annice Needy, MD;  Location: [facility];  Service: Cardiovascular;  Laterality: Left;   LOWER EXTREMITY ANGIOGRAPHY Left 10/20/2018   Procedure: LOWER EXTREMITY ANGIOGRAPHY;  Surgeon: Annice Needy, MD;  Location: [facility];  Service: Cardiovascular;  Laterality: Left;   LOWER EXTREMITY ANGIOGRAPHY Right 11/25/2018   Procedure: LOWER EXTREMITY ANGIOGRAPHY;  Surgeon: Annice Needy, MD;  Location: [facility];  Service: Cardiovascular;  Laterality: Right;   LOWER EXTREMITY ANGIOGRAPHY Right 01/10/2020   Procedure: LOWER EXTREMITY ANGIOGRAPHY;  Surgeon: Annice Needy, MD;  Location: [facility];  Service: Cardiovascular;  Laterality: Right;   LOWER EXTREMITY ANGIOGRAPHY Right 12/11/2020   Procedure: LOWER EXTREMITY ANGIOGRAPHY;  Surgeon: Annice Needy, MD;  Location: [facility];  Service: Cardiovascular;  Laterality: Right;   LOWER EXTREMITY INTERVENTION  02/12/2018   Procedure: LOWER EXTREMITY INTERVENTION;  Surgeon: Annice Needy, MD;  Location: [facility];  Service: Cardiovascular;;   MINOR IRRIGATION AND DEBRIDEMENT OF WOUND Left 09/10/2021   Procedure: IRRIGATION AND DEBRIDEMENT OF LEFT LONG FINGER WOUND;  Surgeon: Allena Napoleon, MD;  Location: [facility];  Service: Plastics;  Laterality: Left;   NECK SURGERY     PERIPHERAL VASCULAR INTERVENTION  02/20/2022   Procedure: PERIPHERAL VASCULAR INTERVENTION;  Surgeon: Cephus Shelling, MD;  Location: [facility];  Service: Cardiovascular;;  right iliac   TRANSMETATARSAL AMPUTATION Left 10/16/2018   Procedure: TRANSMETATARSAL AMPUTATION LEFT FOOT;  Surgeon: Gwyneth Revels, DPM;  Location: [facility];  Service: Podiatry;  Laterality: Left;   VEIN HARVEST Right 02/25/2022   Procedure: VEIN HARVEST OF RIGHT GREATER SAPHENOUS VEIN;  Surgeon: Cephus Shelling, MD;  Location: [facility];  Service: Vascular;  Laterality: Right;       Home Medications    Prior to Admission medications   Medication Sig Start Date End Date Taking? Authorizing Provider  ACCU-CHEK GUIDE test strip 5 (five) times daily. 06/26/20   [provider]  acidophilus (RISAQUAD) CAPS capsule Take 1 capsule by mouth daily  after supper. 03/21/22   Love, Evlyn Kanner, PA-C  apixaban (ELIQUIS) 5 MG TABS tablet Take 1 tablet (5 mg total) by mouth 2 (two) times daily. 03/21/22   Love, Evlyn Kanner, PA-C  ascorbic acid (VITAMIN C) 500 MG tablet Take 1 tablet (500 mg total) by mouth 2 (two) times daily. 03/21/22   Love, Evlyn Kanner, PA-C  aspirin EC 81 MG tablet Take 1 tablet (81 mg total) by mouth daily. 01/10/20   Annice Needy, MD  atorvastatin (LIPITOR) 80 MG tablet Take 1 tablet (80 mg total) by mouth daily at 6 PM. 03/21/22   Love, Evlyn Kanner, PA-C  Blood Glucose Monitoring Suppl (ACCU-CHEK GUIDE) w/Device KIT 5 (five) times daily. 10/27/20   [provider]  Cholecalciferol (VITAMIN D3) 125 MCG (5000 UT) CAPS Take 1 capsule by mouth daily. 04/17/22   [provider]  clopidogrel (PLAVIX) 75 MG tablet Take 1  tablet (75 mg total) by mouth daily at 6 (six) AM. Patient not taking: Reported on 06/25/2022 03/21/22   Love, Evlyn Kanner, PA-C  insulin glargine (LANTUS) 100 UNIT/ML Solostar Pen Inject 41 Units into the skin 2 (two) times daily. 03/21/22   Love, Evlyn Kanner, PA-C  insulin lispro (HUMALOG) 100 UNIT/ML injection Inject 13 Units into the skin 3 (three) times daily before meals.    [provider]  Insulin Lispro w/ Trans Port 100 UNIT/ML SOPN 24 Units 3 (three) times daily. 12/08/19   [provider]  Insulin Pen Needle 32G X 4 MM MISC Use 4 (four) times daily -  before meals and at bedtime. 03/21/22   Love, Evlyn Kanner, PA-C  leptospermum manuka honey (MEDIHONEY) PSTE paste Apply 1 Application topically daily. Patient not taking: Reported on 06/25/2022 03/21/22   Love, Evlyn Kanner, PA-C  losartan (COZAAR) 25 MG tablet Take 1 tablet (25 mg total) by mouth daily. 03/21/22   Love, Evlyn Kanner, PA-C  magnesium oxide (MAG-OX) 400 MG tablet Take 0.5 tablets (200 mg total) by mouth daily. Patient not taking: Reported on 06/25/2022 03/21/22   Love, Evlyn Kanner, PA-C  methocarbamol (ROBAXIN) 500 MG tablet Take 1 tablet (500 mg total) by mouth every 6 (six) hours as needed for muscle spasms. 03/21/22   Love, Evlyn Kanner, PA-C  oxyCODONE (OXY IR/ROXICODONE) 5 MG immediate release tablet Take 1 tablet (5 mg total) by mouth 2 (two) times daily as needed for severe pain. Patient not taking: Reported on 06/25/2022 03/21/22   Love, Evlyn Kanner, PA-C  pantoprazole (PROTONIX) 40 MG tablet Take 1 tablet (40 mg total) by mouth daily. Patient not taking: Reported on 06/25/2022 03/21/22   Love, Evlyn Kanner, PA-C  polyethylene glycol powder (GLYCOLAX/MIRALAX) 17 GM/SCOOP powder Take 34 g by mouth daily. 01/16/23   Anesia Blackwell, DO  TRULICITY 0.75 MG/0.5ML SOPN Inject into the skin. 03/22/22   [provider]  Vitamin D, Ergocalciferol, (DRISDOL) 1.25 MG (50000 UNIT) CAPS capsule Take 1 capsule (50,000 Units total) by mouth every 7  (seven) days. Patient not taking: Reported on 06/25/2022 03/23/22   Love, Evlyn Kanner, PA-C  Zinc Sulfate 220 (50 Zn) MG TABS Take 1 tablet (220 mg total) by mouth daily after supper. Patient not taking: Reported on 06/25/2022 03/21/22   Love, Evlyn Kanner, PA-C    Family History Family History  Problem Relation Age of Onset   Diabetes Mother    Coronary artery disease Father    Hypertension Sister    Leukemia Brother     Social History Social History  Tobacco Use   Smoking status: Former    Packs/day: 1.00    Years: 47.00    Additional pack years: 0.00    Total pack years: 47.00    Types: Cigarettes    Quit date: 01/2021    Years since quitting: 2.0   Smokeless tobacco: Never  Vaping Use   Vaping Use: Never used  Substance Use Topics   Alcohol use: No   Drug use: No     Allergies   Patient has no known allergies.   Review of Systems Review of Systems :negative unless otherwise stated in HPI.      Physical Exam Triage Vital Signs ED Triage Vitals  Enc Vitals Group     BP 01/16/23 1228 (!) 144/66     Pulse Rate 01/16/23 1228 88     Resp 01/16/23 1228 16     Temp 01/16/23 1228 97.6 F (36.4 C)     Temp Source 01/16/23 1228 Oral     SpO2 01/16/23 1228 99 %     Weight --      Height --      Head Circumference --      Peak Flow --      Pain Score 01/16/23 1226 10     Pain Loc --      Pain Edu? --      Excl. in GC? --    No data found.  Updated Vital Signs BP (!) 144/66 (BP Location: Right Arm)   Pulse 88   Temp 97.6 F (36.4 C) (Oral)   Resp 16   SpO2 99%   Visual Acuity Right Eye Distance:   Left Eye Distance:   Bilateral Distance:    Right Eye Near:   Left Eye Near:    Bilateral Near:     Physical Exam  GEN: pleasant chronically ill-appearing male, in no acute distress  CV: regular rate andr irregularly irregular rhythm RESP: no increased work of breathing, clear to ascultation bilaterally ABD: Bowel sounds present. Soft, non-tender,  non-distended.  No guarding, no rebound, no appreciable hepatosplenomegaly, no CVA tenderness MSK: s/p left bka, right LE woody edema, decreased extension of RLE, no knee effusion or joint line tenderness SKIN: warm, dry, linear surgical scar on medial leg  NEURO: alert, moves all extremities appropriately PSYCH: Normal affect, appropriate speech and behavior   UC Treatments / Results  Labs (all labs ordered are listed, but only abnormal results are displayed) Labs Reviewed  COMPREHENSIVE METABOLIC PANEL - Abnormal; Notable for the following components:      Result Value   Glucose, Bld 236 (*)    All other components within normal limits  CBC WITH DIFFERENTIAL/PLATELET  LIPASE, BLOOD    EKG  If EKG performed, see my interpretation and MDM section  Radiology DG Abd 1 View  Result Date: 01/16/2023 CLINICAL DATA:  67 year old male with lower abdominal pain radiating to the legs for 3 months. EXAM: ABDOMEN - 1 VIEW COMPARISON:  Pelvis radiograph 01/17/2022. FINDINGS: AP view of the abdomen at 1316 hours. Bilateral iliac artery vascular stents, extended farther on the right side since last year. No acute osseous abnormality identified. Negative visible bowel gas. Negative lower abdominal and pelvic visceral contours. IMPRESSION: 1. No acute radiographic finding. 2. Bilateral iliac artery vascular stents, more extensive on the right side since last year. Electronically Signed   By: Odessa FlemingH  Hall M.D.   On: 01/16/2023 13:49     Procedures Procedures (including critical care time)  Medications Ordered  in UC Medications - No data to display  Initial Impression / Assessment and Plan / UC Course  I have reviewed the triage vital signs and the nursing notes.  Pertinent labs & imaging results that were available during my care of the patient were reviewed by me and considered in my medical decision making (see chart for details).      Patient is a  67 y.o. male with history of type 2 diabetes,  coronary artery disease, atrial fibrillation, left foot osteomyelitis, hyperlipidemia, peripheral vascular disease who presents for subacute abdominal pain and bilateral leg pain.  On chart review, patient had right lower extremity critical limb ischemia, a MRI of his right foot indicated osteomyelitis and had right internal iliac stents placed in May 2023 as well.   Overall, patient is well-appearing, well-hydrated, and in no acute distress.  Vital signs stable.  Hoseais afebrile.  Exam is not concerning for an acute abdomen.  Obtained  CBC, CMP, and lipase.  He has no urinary symptoms.  CBC and lipase are normal.  CMP was grossly unremarkable. KUB did not show SBO. Did show relative stool burden. Pt reported constipation. Start Miralx. His abdominal pain has been going on for 2-3  months I do not believe this is an acute process.  Recommended he follow-up with his primary care provider and he is willing to do so.  Patient missed initiation of physical therapy today to come here for his ongoing leg pain.  States that he spoke with his primary care provider about this but his pain is still going.  He appears comfortable in the exam room.  He has pain medication at home that he has been taking.  Recommend he continue to take these.  Follow-up with his primary care provider and his vascular surgeon due to concern for possible stent flow issue.  DP pulse is palpable. Pt agrees with this plan.  It appears she is prescribed aspirin and Plavix but is only taking the aspirin.  Follow-up, return and ED precautions given.  Discussed MDM, treatment plan and plan for follow-up with patient who agrees with plan.    Final Clinical Impressions(s) / UC Diagnoses   Final diagnoses:  Lower abdominal pain  Bilateral leg pain     Discharge Instructions      Your pancreas, liver and kidney function are normal.  Your electrolytes are normal.  Your blood sugar was elevated.  Your x-ray showed some stool burden.  I  sent MiraLAX to your pharmacy.  Take 2 capfuls daily as needed for constipation.  Be sure to follow-up with your primary care provider about your abdominal and leg pain.  It is important that you start physical therapy as previously scheduled.      ED Prescriptions     Medication Sig Dispense Auth. Provider   polyethylene glycol powder (GLYCOLAX/MIRALAX) 17 GM/SCOOP powder Take 34 g by mouth daily. 765 g Katha Cabal, DO      PDMP not reviewed this encounter.   Katha Cabal, DO 01/16/23 1511

## 2023-01-16 NOTE — ED Triage Notes (Signed)
Pt presents with lower abdominal pain that radiates down bilateral legs x 3 months. Pt states its difficult for him to stand up. He has a below the knee amputation of his left leg. Pt was prescribed muscle relaxers and OTC medication that is not helping.   He has PT at home 3 times a week.

## 2023-01-16 NOTE — Discharge Instructions (Addendum)
Your pancreas, liver and kidney function are normal.  Your electrolytes are normal.  Your blood sugar was elevated.  Your x-ray showed some stool burden.  I sent MiraLAX to your pharmacy.  Take 2 capfuls daily as needed for constipation.  Be sure to follow-up with your primary care provider about your abdominal and leg pain.  It is important that you start physical therapy as previously scheduled.

## 2023-01-20 ENCOUNTER — Encounter: Payer: 59 | Admitting: Physical Therapy

## 2023-01-22 ENCOUNTER — Encounter: Payer: 59 | Admitting: Physical Therapy

## 2023-01-27 ENCOUNTER — Encounter: Payer: 59 | Admitting: Physical Therapy

## 2023-01-29 ENCOUNTER — Encounter: Payer: 59 | Admitting: Physical Therapy

## 2023-02-05 ENCOUNTER — Encounter: Payer: 59 | Admitting: Physical Therapy

## 2023-02-12 ENCOUNTER — Encounter: Payer: 59 | Admitting: Physical Therapy

## 2023-02-14 ENCOUNTER — Encounter: Payer: 59 | Admitting: Physical Therapy

## 2023-02-19 ENCOUNTER — Encounter: Payer: 59 | Admitting: Physical Therapy

## 2023-02-26 ENCOUNTER — Encounter: Payer: 59 | Admitting: Physical Therapy

## 2023-03-05 ENCOUNTER — Encounter: Payer: 59 | Admitting: Physical Therapy

## 2023-03-10 ENCOUNTER — Encounter: Payer: 59 | Admitting: Physical Therapy

## 2023-03-12 ENCOUNTER — Encounter: Payer: 59 | Admitting: Physical Therapy

## 2023-03-17 ENCOUNTER — Encounter: Payer: 59 | Admitting: Physical Therapy

## 2023-03-19 ENCOUNTER — Encounter: Payer: 59 | Admitting: Physical Therapy

## 2023-03-24 ENCOUNTER — Encounter: Payer: 59 | Admitting: Physical Therapy

## 2023-03-26 ENCOUNTER — Encounter: Payer: 59 | Admitting: Physical Therapy

## 2023-03-31 ENCOUNTER — Encounter: Payer: 59 | Admitting: Physical Therapy

## 2023-04-02 ENCOUNTER — Encounter: Payer: 59 | Admitting: Physical Therapy

## 2023-04-07 ENCOUNTER — Encounter: Payer: 59 | Admitting: Physical Therapy

## 2023-04-09 ENCOUNTER — Encounter: Payer: 59 | Admitting: Physical Therapy

## 2023-04-14 ENCOUNTER — Encounter: Payer: 59 | Admitting: Physical Therapy

## 2023-04-16 ENCOUNTER — Encounter: Payer: 59 | Admitting: Physical Therapy

## 2023-04-21 ENCOUNTER — Encounter: Payer: 59 | Admitting: Physical Therapy

## 2023-04-23 ENCOUNTER — Encounter: Payer: 59 | Admitting: Physical Therapy

## 2023-04-28 ENCOUNTER — Encounter: Payer: 59 | Admitting: Physical Therapy

## 2023-04-28 ENCOUNTER — Other Ambulatory Visit: Payer: Self-pay | Admitting: *Deleted

## 2023-04-28 DIAGNOSIS — I70221 Atherosclerosis of native arteries of extremities with rest pain, right leg: Secondary | ICD-10-CM

## 2023-04-28 DIAGNOSIS — I739 Peripheral vascular disease, unspecified: Secondary | ICD-10-CM

## 2023-04-30 ENCOUNTER — Encounter: Payer: 59 | Admitting: Physical Therapy

## 2023-05-05 ENCOUNTER — Encounter: Payer: 59 | Admitting: Physical Therapy

## 2023-05-07 ENCOUNTER — Encounter: Payer: 59 | Admitting: Physical Therapy

## 2023-05-12 ENCOUNTER — Encounter: Payer: 59 | Admitting: Physical Therapy

## 2023-05-14 ENCOUNTER — Encounter: Payer: 59 | Admitting: Physical Therapy

## 2023-05-19 ENCOUNTER — Encounter: Payer: 59 | Admitting: Physical Therapy

## 2023-05-20 ENCOUNTER — Ambulatory Visit (HOSPITAL_COMMUNITY): Payer: 59 | Attending: Vascular Surgery

## 2023-05-20 ENCOUNTER — Ambulatory Visit: Payer: 59

## 2023-05-20 ENCOUNTER — Ambulatory Visit (HOSPITAL_COMMUNITY): Payer: 59

## 2023-05-21 ENCOUNTER — Encounter: Payer: 59 | Admitting: Physical Therapy

## 2023-05-26 ENCOUNTER — Encounter: Payer: 59 | Admitting: Physical Therapy

## 2023-05-28 ENCOUNTER — Encounter: Payer: 59 | Admitting: Physical Therapy

## 2023-06-02 ENCOUNTER — Encounter: Payer: 59 | Admitting: Physical Therapy

## 2023-06-04 ENCOUNTER — Encounter: Payer: 59 | Admitting: Physical Therapy

## 2023-06-11 ENCOUNTER — Encounter: Payer: 59 | Admitting: Physical Therapy

## 2023-06-16 ENCOUNTER — Encounter: Payer: 59 | Admitting: Physical Therapy

## 2023-06-18 ENCOUNTER — Encounter: Payer: 59 | Admitting: Physical Therapy

## 2023-06-23 ENCOUNTER — Encounter: Payer: 59 | Admitting: Physical Therapy

## 2023-06-25 ENCOUNTER — Encounter: Payer: Self-pay | Admitting: Internal Medicine

## 2023-06-25 ENCOUNTER — Other Ambulatory Visit: Payer: Self-pay

## 2023-06-25 ENCOUNTER — Encounter: Payer: 59 | Admitting: Physical Therapy

## 2023-06-25 ENCOUNTER — Inpatient Hospital Stay
Admission: EM | Admit: 2023-06-25 | Discharge: 2023-07-01 | DRG: 253 | Disposition: A | Discharge status: Skilled Nursing Facility | Payer: 59 | Attending: Internal Medicine | Admitting: Internal Medicine

## 2023-06-25 DIAGNOSIS — Z833 Family history of diabetes mellitus: Secondary | ICD-10-CM

## 2023-06-25 DIAGNOSIS — I70321 Atherosclerosis of unspecified type of bypass graft(s) of the extremities with rest pain, right leg: Secondary | ICD-10-CM | POA: Diagnosis not present

## 2023-06-25 DIAGNOSIS — Y828 Other medical devices associated with adverse incidents: Secondary | ICD-10-CM | POA: Diagnosis present

## 2023-06-25 DIAGNOSIS — I998 Other disorder of circulatory system: Secondary | ICD-10-CM

## 2023-06-25 DIAGNOSIS — Z7902 Long term (current) use of antithrombotics/antiplatelets: Secondary | ICD-10-CM | POA: Diagnosis not present

## 2023-06-25 DIAGNOSIS — Z86718 Personal history of other venous thrombosis and embolism: Secondary | ICD-10-CM | POA: Diagnosis not present

## 2023-06-25 DIAGNOSIS — Z89022 Acquired absence of left finger(s): Secondary | ICD-10-CM | POA: Diagnosis not present

## 2023-06-25 DIAGNOSIS — Z87891 Personal history of nicotine dependence: Secondary | ICD-10-CM | POA: Diagnosis not present

## 2023-06-25 DIAGNOSIS — E1151 Type 2 diabetes mellitus with diabetic peripheral angiopathy without gangrene: Secondary | ICD-10-CM | POA: Diagnosis present

## 2023-06-25 DIAGNOSIS — Z89512 Acquired absence of left leg below knee: Secondary | ICD-10-CM

## 2023-06-25 DIAGNOSIS — Z8249 Family history of ischemic heart disease and other diseases of the circulatory system: Secondary | ICD-10-CM

## 2023-06-25 DIAGNOSIS — I10 Essential (primary) hypertension: Secondary | ICD-10-CM | POA: Diagnosis present

## 2023-06-25 DIAGNOSIS — T82392A Other mechanical complication of femoral arterial graft (bypass), initial encounter: Secondary | ICD-10-CM | POA: Diagnosis present

## 2023-06-25 DIAGNOSIS — Z6833 Body mass index (BMI) 33.0-33.9, adult: Secondary | ICD-10-CM

## 2023-06-25 DIAGNOSIS — I70221 Atherosclerosis of native arteries of extremities with rest pain, right leg: Secondary | ICD-10-CM | POA: Diagnosis present

## 2023-06-25 DIAGNOSIS — I82409 Acute embolism and thrombosis of unspecified deep veins of unspecified lower extremity: Secondary | ICD-10-CM | POA: Diagnosis present

## 2023-06-25 DIAGNOSIS — M79604 Pain in right leg: Secondary | ICD-10-CM | POA: Diagnosis present

## 2023-06-25 DIAGNOSIS — Z79899 Other long term (current) drug therapy: Secondary | ICD-10-CM

## 2023-06-25 DIAGNOSIS — I482 Chronic atrial fibrillation, unspecified: Secondary | ICD-10-CM | POA: Diagnosis present

## 2023-06-25 DIAGNOSIS — K219 Gastro-esophageal reflux disease without esophagitis: Secondary | ICD-10-CM | POA: Diagnosis present

## 2023-06-25 DIAGNOSIS — Z7901 Long term (current) use of anticoagulants: Secondary | ICD-10-CM

## 2023-06-25 DIAGNOSIS — E669 Obesity, unspecified: Secondary | ICD-10-CM | POA: Diagnosis present

## 2023-06-25 DIAGNOSIS — Z7982 Long term (current) use of aspirin: Secondary | ICD-10-CM | POA: Diagnosis not present

## 2023-06-25 DIAGNOSIS — Z794 Long term (current) use of insulin: Secondary | ICD-10-CM

## 2023-06-25 DIAGNOSIS — I700 Atherosclerosis of aorta: Secondary | ICD-10-CM | POA: Diagnosis not present

## 2023-06-25 DIAGNOSIS — Z89431 Acquired absence of right foot: Secondary | ICD-10-CM

## 2023-06-25 DIAGNOSIS — I2510 Atherosclerotic heart disease of native coronary artery without angina pectoris: Secondary | ICD-10-CM | POA: Diagnosis present

## 2023-06-25 DIAGNOSIS — Z95828 Presence of other vascular implants and grafts: Secondary | ICD-10-CM | POA: Diagnosis not present

## 2023-06-25 DIAGNOSIS — Z9889 Other specified postprocedural states: Secondary | ICD-10-CM | POA: Diagnosis not present

## 2023-06-25 DIAGNOSIS — E785 Hyperlipidemia, unspecified: Secondary | ICD-10-CM | POA: Diagnosis present

## 2023-06-25 DIAGNOSIS — Z23 Encounter for immunization: Secondary | ICD-10-CM | POA: Diagnosis not present

## 2023-06-25 DIAGNOSIS — I739 Peripheral vascular disease, unspecified: Secondary | ICD-10-CM | POA: Diagnosis present

## 2023-06-25 LAB — CBC WITH DIFFERENTIAL/PLATELET
Abs Immature Granulocytes: 0.05 10*3/uL (ref 0.00–0.07)
Basophils Absolute: 0 10*3/uL (ref 0.0–0.1)
Basophils Relative: 0 %
Eosinophils Absolute: 0 10*3/uL (ref 0.0–0.5)
Eosinophils Relative: 0 %
HCT: 49.6 % (ref 39.0–52.0)
Hemoglobin: 16.1 g/dL (ref 13.0–17.0)
Immature Granulocytes: 1 %
Lymphocytes Relative: 28 %
Lymphs Abs: 2.7 10*3/uL (ref 0.7–4.0)
MCH: 27.8 pg (ref 26.0–34.0)
MCHC: 32.5 g/dL (ref 30.0–36.0)
MCV: 85.7 fL (ref 80.0–100.0)
Monocytes Absolute: 0.6 10*3/uL (ref 0.1–1.0)
Monocytes Relative: 6 %
Neutro Abs: 6.3 10*3/uL (ref 1.7–7.7)
Neutrophils Relative %: 65 %
Platelets: 209 10*3/uL (ref 150–400)
RBC: 5.79 MIL/uL (ref 4.22–5.81)
RDW: 15.4 % (ref 11.5–15.5)
WBC: 9.6 10*3/uL (ref 4.0–10.5)
nRBC: 0 % (ref 0.0–0.2)

## 2023-06-25 LAB — CBG MONITORING, ED
Glucose-Capillary: 113 mg/dL — ABNORMAL HIGH (ref 70–99)
Glucose-Capillary: 99 mg/dL (ref 70–99)

## 2023-06-25 LAB — COMPREHENSIVE METABOLIC PANEL WITH GFR
ALT: 20 U/L (ref 0–44)
AST: 26 U/L (ref 15–41)
Albumin: 4.2 g/dL (ref 3.5–5.0)
Alkaline Phosphatase: 87 U/L (ref 38–126)
Anion gap: 9 (ref 5–15)
BUN: 14 mg/dL (ref 8–23)
CO2: 22 mmol/L (ref 22–32)
Calcium: 9.6 mg/dL (ref 8.9–10.3)
Chloride: 106 mmol/L (ref 98–111)
Creatinine, Ser: 0.88 mg/dL (ref 0.61–1.24)
GFR, Estimated: >60 mL/min (ref >60)
Glucose, Bld: 132 mg/dL — ABNORMAL HIGH (ref 70–99)
Potassium: 4.5 mmol/L (ref 3.5–5.1)
Sodium: 137 mmol/L (ref 135–145)
Total Bilirubin: 1.2 mg/dL (ref 0.3–1.2)
Total Protein: 8.1 g/dL (ref 6.5–8.1)

## 2023-06-25 LAB — PROTIME-INR
INR: 1.2 (ref 0.8–1.2)
Prothrombin Time: 15.8 s — ABNORMAL HIGH (ref 11.4–15.2)

## 2023-06-25 LAB — GLUCOSE, CAPILLARY: Glucose-Capillary: 197 mg/dL — ABNORMAL HIGH (ref 70–99)

## 2023-06-25 LAB — HEPARIN LEVEL (UNFRACTIONATED): Heparin Unfractionated: >1.1 [IU]/mL — ABNORMAL HIGH (ref 0.30–0.70)

## 2023-06-25 LAB — APTT: aPTT: 34 s (ref 24–36)

## 2023-06-25 MED ORDER — HEPARIN (PORCINE) 25000 UT/250ML-% IV SOLN
1500.0000 [IU]/h | INTRAVENOUS | Status: DC
  Administered 2023-06-25: 1600 [IU]/h via INTRAVENOUS
  Filled 2023-06-25: qty 250

## 2023-06-25 MED ORDER — SODIUM CHLORIDE 0.9 % IV SOLN: INTRAVENOUS | Status: DC

## 2023-06-25 MED ORDER — ACETAMINOPHEN 325 MG PO TABS: 650.0000 mg | ORAL_TABLET | Freq: Four times a day (QID) | ORAL | Status: DC | PRN

## 2023-06-25 MED ORDER — ATORVASTATIN CALCIUM 20 MG PO TABS
80.0000 mg | ORAL_TABLET | Freq: Every day | ORAL | Status: DC
  Administered 2023-06-26 – 2023-06-30 (×5): 80 mg via ORAL
  Filled 2023-06-25 (×5): qty 4

## 2023-06-25 MED ORDER — OXYCODONE-ACETAMINOPHEN 5-325 MG PO TABS
1.0000 | ORAL_TABLET | ORAL | Status: DC | PRN
  Administered 2023-06-26 (×3): 1 via ORAL
  Filled 2023-06-25 (×3): qty 1

## 2023-06-25 MED ORDER — VITAMIN C 500 MG PO TABS
500.0000 mg | ORAL_TABLET | Freq: Two times a day (BID) | ORAL | Status: DC
  Administered 2023-06-26 – 2023-07-01 (×11): 500 mg via ORAL
  Filled 2023-06-25 (×12): qty 1

## 2023-06-25 MED ORDER — METHOCARBAMOL 500 MG PO TABS
500.0000 mg | ORAL_TABLET | Freq: Four times a day (QID) | ORAL | Status: DC | PRN
  Administered 2023-06-26 – 2023-06-27 (×2): 500 mg via ORAL
  Filled 2023-06-25 (×2): qty 1

## 2023-06-25 MED ORDER — VITAMIN D 25 MCG (1000 UNIT) PO TABS
5000.0000 [IU] | ORAL_TABLET | Freq: Every day | ORAL | Status: DC
  Administered 2023-06-27 – 2023-07-01 (×5): 5000 [IU] via ORAL
  Filled 2023-06-25 (×5): qty 5

## 2023-06-25 MED ORDER — ONDANSETRON HCL 4 MG/2ML IJ SOLN: 4.0000 mg | Freq: Three times a day (TID) | INTRAMUSCULAR | Status: DC | PRN

## 2023-06-25 MED ORDER — MORPHINE SULFATE (PF) 4 MG/ML IV SOLN
4.0000 mg | Freq: Once | INTRAVENOUS | Status: AC
  Administered 2023-06-25: 4 mg via INTRAVENOUS
  Filled 2023-06-25: qty 1

## 2023-06-25 MED ORDER — LOSARTAN POTASSIUM 25 MG PO TABS
25.0000 mg | ORAL_TABLET | Freq: Every day | ORAL | Status: DC
  Administered 2023-06-27 – 2023-07-01 (×5): 25 mg via ORAL
  Filled 2023-06-25 (×5): qty 1

## 2023-06-25 MED ORDER — ASPIRIN 81 MG PO TBEC
81.0000 mg | DELAYED_RELEASE_TABLET | Freq: Every day | ORAL | Status: DC
  Administered 2023-06-27 – 2023-07-01 (×5): 81 mg via ORAL
  Filled 2023-06-25 (×5): qty 1

## 2023-06-25 MED ORDER — MORPHINE SULFATE (PF) 2 MG/ML IV SOLN
2.0000 mg | INTRAVENOUS | Status: DC | PRN
  Administered 2023-06-25 – 2023-06-27 (×5): 2 mg via INTRAVENOUS
  Filled 2023-06-25 (×5): qty 1

## 2023-06-25 MED ORDER — NICOTINE 21 MG/24HR TD PT24
21.0000 mg | MEDICATED_PATCH | Freq: Every day | TRANSDERMAL | Status: DC
  Administered 2023-06-27 – 2023-07-01 (×5): 21 mg via TRANSDERMAL
  Filled 2023-06-25 (×5): qty 1

## 2023-06-25 MED ORDER — INSULIN ASPART 100 UNIT/ML IJ SOLN
0.0000 [IU] | Freq: Every day | INTRAMUSCULAR | Status: DC
  Administered 2023-06-28 – 2023-06-29 (×2): 2 [IU] via SUBCUTANEOUS
  Administered 2023-06-30: 3 [IU] via SUBCUTANEOUS
  Filled 2023-06-25 (×3): qty 1

## 2023-06-25 MED ORDER — RISAQUAD PO CAPS
1.0000 | ORAL_CAPSULE | Freq: Every day | ORAL | Status: DC
  Administered 2023-06-26 – 2023-06-30 (×5): 1 via ORAL
  Filled 2023-06-25 (×5): qty 1

## 2023-06-25 MED ORDER — INSULIN GLARGINE-YFGN 100 UNIT/ML ~~LOC~~ SOLN
30.0000 [IU] | Freq: Every day | SUBCUTANEOUS | Status: DC
  Administered 2023-06-26 – 2023-06-28 (×4): 30 [IU] via SUBCUTANEOUS
  Filled 2023-06-25 (×4): qty 0.3

## 2023-06-25 MED ORDER — INSULIN ASPART 100 UNIT/ML IJ SOLN
0.0000 [IU] | Freq: Three times a day (TID) | INTRAMUSCULAR | Status: DC
  Administered 2023-06-26 – 2023-06-28 (×6): 2 [IU] via SUBCUTANEOUS
  Administered 2023-06-28: 3 [IU] via SUBCUTANEOUS
  Administered 2023-06-29: 2 [IU] via SUBCUTANEOUS
  Administered 2023-06-29: 3 [IU] via SUBCUTANEOUS
  Administered 2023-06-29 – 2023-06-30 (×4): 5 [IU] via SUBCUTANEOUS
  Administered 2023-07-01: 7 [IU] via SUBCUTANEOUS
  Administered 2023-07-01: 3 [IU] via SUBCUTANEOUS
  Filled 2023-06-25 (×15): qty 1

## 2023-06-25 MED ORDER — ZINC SULFATE 220 (50 ZN) MG PO CAPS
220.0000 mg | ORAL_CAPSULE | Freq: Every day | ORAL | Status: DC
  Administered 2023-06-26 – 2023-06-30 (×5): 220 mg via ORAL
  Filled 2023-06-25 (×5): qty 1

## 2023-06-25 MED ORDER — HYDRALAZINE HCL 20 MG/ML IJ SOLN: 5.0000 mg | INTRAMUSCULAR | Status: DC | PRN

## 2023-06-25 NOTE — H&P (Signed)
History and Physical    Austin [name] IHK:[id number] DOB: December 22, 1955 DOA: 06/25/2023  Referring MD/NP/PA:   PCP: Preston Fleeting, MD   Patient coming from:  The patient is coming from home.     Chief Complaint: right leg pain  HPI: Austin [name] is a 67 y.o. male with medical history significant of PVD (s/p of stent in right leg), HTN, HLD,m DM, CAD, dCHF, A fib on Eliquis, s/p of left BKA, obesity, former smoker, who presents with right leg pain.   Pt states that his right leg pain started this afternoon, which is mainly located in the right lower leg, but also involving the whole right leg.  The pain is constant, throbbing, severe, nonradiating.  Not aggravated or alleviated by any known factors.  No injury. He state that his foot feels and lower leg are cool to touch. Patient does not have chest pain, cough or SOB. No nausea, vomiting, diarrhea or abdominal pain.  Denies symptoms of UTI.  No fever or chills. Pt has does not have palpable pulse in his right foot but does have good biphasic Doppler signal in ED.   Data reviewed independently and ED Course: pt was found to have WBC 9.6, GFR> 60, temperature normal, blood pressure 130/83, heart rate 83, RR 17, oxygen saturation 95% on room air.  Patient is admitted to telemetry bed as inpatient.  Consulted NP, Sheppard Plumber of [facility] by EDP.   EKG: Not done in ED, will get one.   Review of Systems:   General: no fevers, chills, no body weight gain, has fatigue HEENT: no blurry vision, hearing changes or sore throat Respiratory: no dyspnea, coughing, wheezing CV: no chest pain, no palpitations GI: no nausea, vomiting, abdominal pain, diarrhea, constipation GU: no dysuria, burning on urination, increased urinary frequency, hematuria  Ext: has trace right leg edema and left BKA Neuro: no unilateral weakness, numbness, or tingling, no vision change or hearing loss Skin: no rash, no skin tear. MSK: no deformity, no limitation of  range of movement in spin. Has right leg pain Heme: No easy bruising.  Travel history: No recent long distant travel.   Allergy: No Known Allergies  Past Medical History:  Diagnosis Date   Coronary artery disease    Diabetes (HCC)    DVT (deep venous thrombosis) (HCC)    Gangrene of left foot (HCC) 02/09/2018   GERD (gastroesophageal reflux disease)    Hyperlipidemia    Hypertension    Peripheral vascular disease (HCC)     Past Surgical History:  Procedure Laterality Date   ABDOMINAL AORTOGRAM W/LOWER EXTREMITY N/A 02/20/2022   Procedure: ABDOMINAL AORTOGRAM W/LOWER EXTREMITY;  Surgeon: Cephus Shelling, MD;  Location: [facility];  Service: Cardiovascular;  Laterality: N/A;   AMPUTATION Left 10/28/2018   Procedure: AMPUTATION BELOW KNEE;  Surgeon: Annice Needy, MD;  Location: [facility];  Service: Vascular;  Laterality: Left;   AMPUTATION Left 09/10/2021   Procedure: AMPUTATION DIGIT;  Surgeon: Allena Napoleon, MD;  Location: [facility];  Service: Plastics;  Laterality: Left;   AMPUTATION Left 09/14/2021   Procedure: AMPUTATION DIGIT left long finger and irrigation and debridement;  Surgeon: Allena Napoleon, MD;  Location: [facility];  Service: Plastics;  Laterality: Left;   AMPUTATION Right 02/27/2022   Procedure: RIGHT PARTIAL AMPUTATION FOOT;  Surgeon: Felecia Shelling, DPM;  Location: [facility];  Service: Podiatry;  Laterality: Right;   AMPUTATION TOE Left 02/10/2018   Procedure: AMPUTATION TOE;  Surgeon: Linus Galas, DPM;  Location: [facility];  Service: Podiatry;  Laterality: Left;   APPLICATION OF WOUND VAC Left 10/16/2018   Procedure: APPLICATION OF WOUND VAC;  Surgeon: Gwyneth Revels, DPM;  Location: [facility];  Service: Podiatry;  Laterality: Left;   DORSAL SLIT N/A 01/12/2021   Procedure: DORSAL SLIT;  Surgeon: Sondra Come, MD;  Location: [facility];  Service: Urology;  Laterality: N/A;   FEMORAL-TIBIAL BYPASS GRAFT Right 02/25/2022   Procedure: RIGHT LOWER EXTREMITY FEMORAL TO  TIBIAL PERONEAL TRUNK BYPASS;  Surgeon: Cephus Shelling, MD;  Location: [facility];  Service: Vascular;  Laterality: Right;  INSERT ARTERIAL LINE   groin surgery     IRRIGATION AND DEBRIDEMENT FOOT Left 09/30/2018   Procedure: IRRIGATION AND DEBRIDEMENT FOOT;  Surgeon: Linus Galas, DPM;  Location: [facility];  Service: Podiatry;  Laterality: Left;   LOWER EXTREMITY ANGIOGRAPHY Left 02/12/2018   Procedure: Lower Extremity Angiography;  Surgeon: Annice Needy, MD;  Location: [facility];  Service: Cardiovascular;  Laterality: Left;   LOWER EXTREMITY ANGIOGRAPHY Left 10/01/2018   Procedure: Lower Extremity Angiography;  Surgeon: Annice Needy, MD;  Location: [facility];  Service: Cardiovascular;  Laterality: Left;   LOWER EXTREMITY ANGIOGRAPHY Left 10/19/2018   Procedure: Lower Extremity Angiography;  Surgeon: Annice Needy, MD;  Location: [facility];  Service: Cardiovascular;  Laterality: Left;   LOWER EXTREMITY ANGIOGRAPHY Left 10/20/2018   Procedure: LOWER EXTREMITY ANGIOGRAPHY;  Surgeon: Annice Needy, MD;  Location: [facility];  Service: Cardiovascular;  Laterality: Left;   LOWER EXTREMITY ANGIOGRAPHY Right 11/25/2018   Procedure: LOWER EXTREMITY ANGIOGRAPHY;  Surgeon: Annice Needy, MD;  Location: [facility];  Service: Cardiovascular;  Laterality: Right;   LOWER EXTREMITY ANGIOGRAPHY Right 01/10/2020   Procedure: LOWER EXTREMITY ANGIOGRAPHY;  Surgeon: Annice Needy, MD;  Location: [facility];  Service: Cardiovascular;  Laterality: Right;   LOWER EXTREMITY ANGIOGRAPHY Right 12/11/2020   Procedure: LOWER EXTREMITY ANGIOGRAPHY;  Surgeon: Annice Needy, MD;  Location: [facility];  Service: Cardiovascular;  Laterality: Right;   LOWER EXTREMITY INTERVENTION  02/12/2018   Procedure: LOWER EXTREMITY INTERVENTION;  Surgeon: Annice Needy, MD;  Location: [facility];  Service: Cardiovascular;;   MINOR IRRIGATION AND DEBRIDEMENT OF WOUND Left  09/10/2021   Procedure: IRRIGATION AND DEBRIDEMENT OF LEFT LONG FINGER WOUND;  Surgeon: Allena Napoleon, MD;  Location: [facility];  Service: Plastics;  Laterality: Left;   NECK SURGERY     PERIPHERAL VASCULAR INTERVENTION  02/20/2022   Procedure: PERIPHERAL VASCULAR INTERVENTION;  Surgeon: Cephus Shelling, MD;  [facility]: [facility];  Service: Cardiovascular;;  right iliac   TRANSMETATARSAL AMPUTATION Left 10/16/2018   Procedure: TRANSMETATARSAL AMPUTATION LEFT FOOT;  Surgeon: Gwyneth Revels, DPM;  Location: [facility];  Service: Podiatry;  Laterality: Left;   VEIN HARVEST Right 02/25/2022   Procedure: VEIN HARVEST OF RIGHT GREATER SAPHENOUS VEIN;  Surgeon: Cephus Shelling, MD;  Location: [facility];  Service: Vascular;  Laterality: Right;    Social History:  reports that he quit smoking about 2 years ago. His smoking use included cigarettes. He started smoking about 49 years ago. He has a 47 pack-year smoking history. He has never used smokeless tobacco. He reports that he does not drink alcohol and does not use drugs.  Family History:  Family History  Problem Relation Age of Onset   Diabetes Mother    Coronary  artery disease Father    Hypertension Sister    Leukemia Brother      Prior to Admission medications   Medication Sig Start Date End Date Taking? Authorizing Provider  NICORETTE STARTER KIT 4 MG gum Take 4 mg by mouth as needed for smoking cessation. 02/05/23  Yes [provider]  ACCU-CHEK GUIDE test strip 5 (five) times daily. 06/26/20   [provider]  acidophilus (RISAQUAD) CAPS capsule Take 1 capsule by mouth daily after supper. 03/21/22   Love, Evlyn Kanner, PA-C  apixaban (ELIQUIS) 5 MG TABS tablet Take 1 tablet (5 mg total) by mouth 2 (two) times daily. 03/21/22   Love, Evlyn Kanner, PA-C  ascorbic acid (VITAMIN C) 500 MG tablet Take 1 tablet (500 mg total) by mouth 2 (two) times daily. 03/21/22   Love, Evlyn Kanner, PA-C  aspirin EC 81 MG tablet Take 1 tablet (81 mg  total) by mouth daily. 01/10/20   Annice Needy, MD  atorvastatin (LIPITOR) 80 MG tablet Take 1 tablet (80 mg total) by mouth daily at 6 PM. 03/21/22   Love, Evlyn Kanner, PA-C  Cholecalciferol (VITAMIN D3) 125 MCG (5000 UT) CAPS Take 1 capsule by mouth daily. 04/17/22   [provider]  clopidogrel (PLAVIX) 75 MG tablet Take 1 tablet (75 mg total) by mouth daily at 6 (six) AM. Patient not taking: Reported on 06/25/2022 03/21/22   Love, Evlyn Kanner, PA-C  insulin glargine (LANTUS) 100 UNIT/ML Solostar Pen Inject 41 Units into the skin 2 (two) times daily. 03/21/22   Love, Evlyn Kanner, PA-C  insulin lispro (HUMALOG) 100 UNIT/ML injection Inject 13 Units into the skin 3 (three) times daily before meals.    [provider]  Insulin Lispro w/ Trans Port 100 UNIT/ML SOPN 24 Units 3 (three) times daily. 12/08/19   [provider]  leptospermum manuka honey (MEDIHONEY) PSTE paste Apply 1 Application topically daily. Patient not taking: Reported on 06/25/2022 03/21/22   Love, Evlyn Kanner, PA-C  losartan (COZAAR) 25 MG tablet Take 1 tablet (25 mg total) by mouth daily. 03/21/22   Love, Evlyn Kanner, PA-C  magnesium oxide (MAG-OX) 400 MG tablet Take 0.5 tablets (200 mg total) by mouth daily. Patient not taking: Reported on 06/25/2022 03/21/22   Love, Evlyn Kanner, PA-C  methocarbamol (ROBAXIN) 500 MG tablet Take 1 tablet (500 mg total) by mouth every 6 (six) hours as needed for muscle spasms. 03/21/22   Love, Evlyn Kanner, PA-C  oxyCODONE (OXY IR/ROXICODONE) 5 MG immediate release tablet Take 1 tablet (5 mg total) by mouth 2 (two) times daily as needed for severe pain. Patient not taking: Reported on 06/25/2022 03/21/22   Love, Evlyn Kanner, PA-C  pantoprazole (PROTONIX) 40 MG tablet Take 1 tablet (40 mg total) by mouth daily. Patient not taking: Reported on 06/25/2022 03/21/22   Love, Evlyn Kanner, PA-C  polyethylene glycol powder (GLYCOLAX/MIRALAX) 17 GM/SCOOP powder Take 34 g by mouth daily. 01/16/23   Brimage, Vondra, DO   TRULICITY 0.75 MG/0.5ML SOPN Inject into the skin. 03/22/22   [provider]  Vitamin D, Ergocalciferol, (DRISDOL) 1.25 MG (50000 UNIT) CAPS capsule Take 1 capsule (50,000 Units total) by mouth every 7 (seven) days. Patient not taking: Reported on 06/25/2022 03/23/22   Love, Evlyn Kanner, PA-C  Zinc Sulfate 220 (50 Zn) MG TABS Take 1 tablet (220 mg total) by mouth daily after supper. Patient not taking: Reported on 06/25/2022 03/21/22   Jacquelynn Cree, PA-C    Physical Exam: Vitals:  06/25/23 1601 06/25/23 1602 06/25/23 2206  BP:  130/83 (!) 144/93  Pulse:  83 97  Resp:  17 16  Temp:  97.7 F (36.5 C) 97.8 F (36.6 C)  SpO2:  95% 100%  Weight: 113.4 kg    Height: 6' (1.829 m)     General: Not in acute distress HEENT:       Eyes: PERRL, EOMI, no jaundice       ENT: No discharge from the ears and nose, no pharynx injection, no tonsillar enlargement.        Neck: No JVD, no bruit, no mass felt. Heme: No neck lymph node enlargement. Cardiac: S1/S2, RRR, No murmurs, No gallops or rubs. Respiratory: No rales, wheezing, rhonchi or rubs. GI: Soft, nondistended, nontender, no rebound pain, no organomegaly, BS present. GU: No hematuria Ext: has trace right leg edema. Has left BKA.  Right lower leg and foot are cool and tender to touch. Right DP/PT pulse not palpable. Musculoskeletal: No joint deformities, No joint redness or warmth, no limitation of ROM in spin.  Skin: No rashes.  Neuro: Alert, oriented X3, cranial nerves II-XII grossly intact, moves all extremities normally. Psych: Patient is not psychotic, no suicidal or hemocidal ideation.  Labs on Admission: I have personally reviewed following labs and imaging studies  CBC: Recent Labs  Lab 06/25/23 1610  WBC 9.6  NEUTROABS 6.3  HGB 16.1  HCT 49.6  MCV 85.7  PLT 209   Basic Metabolic Panel: Recent Labs  Lab 06/25/23 1610  NA 137  K 4.5  CL 106  CO2 22  GLUCOSE 132*  BUN 14  CREATININE 0.88  CALCIUM 9.6    GFR: Estimated Creatinine Clearance: 105.9 mL/min (by C-G formula based on SCr of 0.88 mg/dL). Liver Function Tests: Recent Labs  Lab 06/25/23 1610  AST 26  ALT 20  ALKPHOS 87  BILITOT 1.2  PROT 8.1  ALBUMIN 4.2   No results for input(s): "LIPASE", "AMYLASE" in the last 168 hours. No results for input(s): "AMMONIA" in the last 168 hours. Coagulation Profile: Recent Labs  Lab 06/25/23 1812  INR 1.2   Cardiac Enzymes: No results for input(s): "CKTOTAL", "CKMB", "CKMBINDEX", "TROPONINI" in the last 168 hours. BNP (last 3 results) No results for input(s): "PROBNP" in the last 8760 hours. HbA1C: No results for input(s): "HGBA1C" in the last 72 hours. CBG: Recent Labs  Lab 06/25/23 1604 06/25/23 1855  GLUCAP 113* 99   Lipid Profile: No results for input(s): "CHOL", "HDL", "LDLCALC", "TRIG", "CHOLHDL", "LDLDIRECT" in the last 72 hours. Thyroid Function Tests: No results for input(s): "TSH", "T4TOTAL", "FREET4", "T3FREE", "THYROIDAB" in the last 72 hours. Anemia Panel: No results for input(s): "VITAMINB12", "FOLATE", "FERRITIN", "TIBC", "IRON", "RETICCTPCT" in the last 72 hours. Urine analysis:    Component Value Date/Time   COLORURINE YELLOW (A) 03/30/2021 0958   APPEARANCEUR CLEAR (A) 03/30/2021 0958   APPEARANCEUR Cloudy (A) 11/20/2020 1611   LABSPEC 1.027 03/30/2021 0958   LABSPEC 1.024 11/12/2013 2001   PHURINE 5.0 03/30/2021 0958   GLUCOSEU >=500 (A) 03/30/2021 0958   GLUCOSEU >=500 11/12/2013 2001   HGBUR NEGATIVE 03/30/2021 0958   BILIRUBINUR NEGATIVE 03/30/2021 0958   BILIRUBINUR Negative 11/20/2020 1611   BILIRUBINUR Negative 11/12/2013 2001   KETONESUR NEGATIVE 03/30/2021 0958   PROTEINUR NEGATIVE 03/30/2021 0958   NITRITE NEGATIVE 03/30/2021 0958   LEUKOCYTESUR NEGATIVE 03/30/2021 0958   LEUKOCYTESUR Negative 11/12/2013 2001   Sepsis Labs: @LABRCNTIP (procalcitonin:4,lacticidven:4) )No results found for this or any previous visit (from  the past  240 hour(s)).   Radiological Exams on Admission: No results found.    Assessment/Plan Principal Problem:   Critical limb ischemia of right lower extremity (HCC) Active Problems:   Peripheral vascular disease (HCC)   Essential hypertension   Hyperlipidemia   Coronary artery disease   Type 2 diabetes mellitus with diabetic peripheral angiopathy without gangrene (HCC)   Atrial fibrillation, chronic (HCC)   DVT (deep venous thrombosis) (HCC)   Obesity (BMI 30-39.9)   Assessment and Plan:  Principal Problem:   Critical limb ischemia of right lower extremity (HCC) Active Problems:   Peripheral vascular disease (HCC)   Essential hypertension   Hyperlipidemia   Coronary artery disease   Type 2 diabetes mellitus with diabetic peripheral angiopathy without gangrene (HCC)   Atrial fibrillation, chronic (HCC)   DVT (deep venous thrombosis) (HCC)   Obesity (BMI 30-39.9)    DVT ppx: on IV Heparin    Code Status: Full code    Family Communication:  Yes, patient's niece    at bed side.      Disposition Plan:  Anticipate discharge back to previous environment  Consults called:  Consulted NP, Sheppard Plumber of [facility] by EDP.  Admission status and Level of care: Telemetry Medical:   as inpt     Dispo: The patient is from: Home              Anticipated d/c is to: Home              Anticipated d/c date is: 2 days              Patient currently is not medically stable to d/c.    Severity of Illness:  The appropriate patient status for this patient is INPATIENT. Inpatient status is judged to be reasonable and necessary in order to provide the required intensity of service to ensure the patient's safety. [name] presenting symptoms, physical exam findings, and initial radiographic and laboratory data in the context of their chronic comorbidities is felt to place them at high risk for further clinical deterioration. Furthermore, it is not anticipated that the patient will be  medically stable for discharge from the hospital within 2 midnights of admission.   * I certify that at the point of admission it is my clinical judgment that the patient will require inpatient hospital care spanning beyond 2 midnights from the point of admission due to high intensity of service, high risk for further deterioration and high frequency of surveillance required.*       Date of Service 06/25/2023    Lorretta Harp [facility]   If 7PM-7AM, please contact night-coverage [email or web address] 06/25/2023, 10:25 PM

## 2023-06-25 NOTE — Consult Note (Signed)
ANTICOAGULATION CONSULT NOTE - Initial Consult  Pharmacy Consult for Heparin Infusion Indication:  limb ischemia  No Known Allergies  Patient Measurements: Height: 6' (182.9 cm) Weight: 113.4 kg (250 lb) IBW/kg (Calculated) : 77.6 Heparin Dosing Weight: 101.9 kg  Vital Signs: Temp: 97.7 F (36.5 C) (09/18 1602) BP: 130/83 (09/18 1602) Pulse Rate: 83 (09/18 1602)  Labs: Recent Labs    06/25/23 1610  HGB 16.1  HCT 49.6  PLT 209  CREATININE 0.88    Estimated Creatinine Clearance: 105.9 mL/min (by C-G formula based on SCr of 0.88 mg/dL).   Medical History: Past Medical History:  Diagnosis Date   Coronary artery disease    Diabetes (HCC)    DVT (deep venous thrombosis) (HCC)    Gangrene of left foot (HCC) 02/09/2018   GERD (gastroesophageal reflux disease)    Hyperlipidemia    Hypertension    Peripheral vascular disease (HCC)    Assessment: Athony D [name] is a 67 y.o. male presenting with leg pain. PMH significant for HTN, HLD, T2DM, CAD, DVT, AF (on apixaban), PAD, and left BKA. Patient was on [facility] PTA per chart review. Last dose of apixaban was 9/18 at 0700. Patient does not have a palpable pulse in his right foot but does have good biphasic Doppler signal. Pharmacy has been consulted to initiate and manage heparin infusion.   Baseline Labs: aPTT 34, HL >1.10, PT 15.8, INR 1.2, Hgb 16.1, Hct 49.6, Plt 209   Goal of Therapy:  Heparin level 0.3-0.7 units/ml aPTT 66-102 seconds Monitor platelets by anticoagulation protocol: Yes   Plan:  No bolus as last dose of apixaban was <12h ago Start heparin infusion at 1600 units/hr Check aPTT in 6 hours Continue to monitor aPTT until HL and aPTT correlate.  Check HL daily for correlation until HL and aPTT correlate. Switch to HL monitoring once HL and aPTT correlate.  Continue to monitor H&H and platelets daily while on heparin infusion    Celene Squibb, PharmD Clinical Pharmacist 06/25/2023 6:24 PM

## 2023-06-25 NOTE — ED Triage Notes (Signed)
Pt c/o R leg pain since this morning. Pt denies injury. Pt states, "It feels like a cramp in my muscle." Pt describes it as a throbbing sensation in his thigh. Pt has diabetes and and PVD. DP is weak. Foot is warm to touch. Pt has healing wound the shin, but no signs of infection. Pt has not taken any OTC medication to help alleviate the pain.

## 2023-06-25 NOTE — ED Provider Notes (Signed)
[facility] Provider Note    Event Date/Time   First MD Initiated Contact with Patient 06/25/23 1735     (approximate)   History   Chief Complaint Leg Pain   HPI  Austin [name] is a 67 y.o. male with past medical history of hypertension, hyperlipidemia, diabetes, CAD, DVT, atrial fibrillation on Eliquis, PAD, and left BKA who presents to the ED complaining of leg pain.  Patient reports that he has been dealing with throbbing pain in his right calf since earlier this afternoon, has been gradually worsening since onset and is now severe.  He denies any falls or injuries to the leg, has not noticed any redness or swelling to the leg.  He does state that his foot feels cool to touch but he has been able to move his foot and ankle.     Physical Exam   Triage Vital Signs: ED Triage Vitals  Encounter Vitals Group     BP 06/25/23 1602 130/83     Systolic BP Percentile --      Diastolic BP Percentile --      Pulse Rate 06/25/23 1602 83     Resp 06/25/23 1602 17     Temp 06/25/23 1602 97.7 F (36.5 C)     Temp src --      SpO2 06/25/23 1602 95 %     Weight 06/25/23 1601 250 lb (113.4 kg)     Height 06/25/23 1601 6' (1.829 m)     Head Circumference --      Peak Flow --      Pain Score 06/25/23 1601 10     Pain Loc --      Pain Education --      Exclude from Growth Chart --     Most recent vital signs: Vitals:   06/25/23 1602  BP: 130/83  Pulse: 83  Resp: 17  Temp: 97.7 F (36.5 C)  SpO2: 95%    Constitutional: Alert and oriented. Eyes: Conjunctivae are normal. Head: Atraumatic. Nose: No congestion/rhinnorhea. Mouth/Throat: Mucous membranes are moist.  Cardiovascular: Normal rate, regular rhythm. Grossly normal heart sounds.  2+ radial pulses bilaterally.  Unable to palpate DP pulse on right, biphasic Doppler signals noted with delayed cap refill in toes of right foot.  Right foot is cool to touch, patient status post left  BKA. Respiratory: Normal respiratory effort.  No retractions. Lungs CTAB. Gastrointestinal: Soft and nontender. No distention. Musculoskeletal: No lower extremity tenderness nor edema.  Neurologic:  Normal speech and language. No gross focal neurologic deficits are appreciated.    ED Results / Procedures / Treatments   Labs (all labs ordered are listed, but only abnormal results are displayed) Labs Reviewed  COMPREHENSIVE METABOLIC PANEL - Abnormal; Notable for the following components:      Result Value   Glucose, Bld 132 (*)    All other components within normal limits  CBG MONITORING, ED - Abnormal; Notable for the following components:   Glucose-Capillary 113 (*)    All other components within normal limits  CBC WITH DIFFERENTIAL/PLATELET  APTT  PROTIME-INR    PROCEDURES:  Critical Care performed: Yes, see critical care procedure note(s)  .Critical Care  Performed by: Chesley Noon, MD Authorized by: Chesley Noon, MD   Critical care provider statement:    Critical care time (minutes):  30   Critical care time was exclusive of:  Separately billable procedures and treating other patients and teaching time   Critical care  was necessary to treat or prevent imminent or life-threatening deterioration of the following conditions: limb ischemia.   Critical care was time spent personally by me on the following activities:  Development of treatment plan with patient or surrogate, discussions with consultants, evaluation of patient's response to treatment, examination of patient, ordering and review of laboratory studies, ordering and review of radiographic studies, ordering and performing treatments and interventions, pulse oximetry, re-evaluation of patient's condition and review of old charts   I assumed direction of critical care for this patient from another provider in my specialty: no     Care discussed with: admitting provider      MEDICATIONS ORDERED IN  ED: Medications  morphine (PF) 4 MG/ML injection 4 mg (4 mg Intravenous Given 06/25/23 1811)     IMPRESSION / MDM / ASSESSMENT AND PLAN / ED COURSE  I reviewed the triage vital signs and the nursing notes.                              67 y.o. male with past medical history of hypertension, hyperlipidemia, diabetes, CAD, DVT, atrial fibrillation on Eliquis, PAD, and left BKA who presents to the ED complaining of throbbing pain in his right thigh for the past few hours, gradually worsening now with cool sensation in his right foot.  Patient's presentation is most consistent with acute presentation with potential threat to life or bodily function.  Differential diagnosis includes, but is not limited to, limb ischemia, DVT, cellulitis, muscle strain.  Patient uncomfortable appearing, clutching at his right thigh and having difficulty sitting still.  Vital signs are unremarkable, he does not have a palpable pulse in his right foot but does have good biphasic Doppler signal.  Despite this, foot feels cool to touch and he has delayed cap refill and I am concerned for limb ischemia.  Case discussed with NP Manson Passey of vascular surgery, who agrees with plan for IV heparin and admission to the hospital.  We will control pain with IV morphine, vascular surgery tentatively to plan for revascularization in the morning but asked that patient be kept NPO.  Labs are unremarkable with no significant anemia, leukocytosis, tract abnormality, or AKI.  LFTs are also unremarkable.  Case discussed with hospitalist for admission.      FINAL CLINICAL IMPRESSION(S) / ED DIAGNOSES   Final diagnoses:  Right leg pain  Limb ischemia     Rx / DC Orders   ED Discharge Orders     None        Note:  This document was prepared using Dragon voice recognition software and may include unintentional dictation errors.   Chesley Noon, MD 06/25/23 [phone]

## 2023-06-25 NOTE — ED Notes (Signed)
Pt hooked up to cardia monitor by this EDT. This tech tried checking pts sugar level 3 times. 2 in the left hand once in the right. This tech could not get pts sugar level to read. RN notified.

## 2023-06-25 NOTE — ED Notes (Signed)
First Nurse note-pt brought in via ems from home. Pt has right leg pain and right ankle swelling.  Fsbs 165 per ems  bp 171/85 oxygen sats 99%. Pulse 110.  Pt in wheelchair in lobby.

## 2023-06-26 ENCOUNTER — Encounter
Admission: EM | Disposition: A | Discharge status: Skilled Nursing Facility | Payer: Self-pay | Source: Home / Self Care | Attending: Internal Medicine

## 2023-06-26 ENCOUNTER — Encounter: Payer: Self-pay | Admitting: Internal Medicine

## 2023-06-26 DIAGNOSIS — Z95828 Presence of other vascular implants and grafts: Secondary | ICD-10-CM | POA: Diagnosis not present

## 2023-06-26 DIAGNOSIS — I700 Atherosclerosis of aorta: Secondary | ICD-10-CM | POA: Diagnosis not present

## 2023-06-26 DIAGNOSIS — I70321 Atherosclerosis of unspecified type of bypass graft(s) of the extremities with rest pain, right leg: Secondary | ICD-10-CM

## 2023-06-26 DIAGNOSIS — Z9889 Other specified postprocedural states: Secondary | ICD-10-CM

## 2023-06-26 DIAGNOSIS — M79604 Pain in right leg: Secondary | ICD-10-CM | POA: Diagnosis not present

## 2023-06-26 DIAGNOSIS — I70221 Atherosclerosis of native arteries of extremities with rest pain, right leg: Secondary | ICD-10-CM | POA: Diagnosis not present

## 2023-06-26 HISTORY — PX: LOWER EXTREMITY ANGIOGRAPHY: CATH118251

## 2023-06-26 LAB — GLUCOSE, CAPILLARY
Glucose-Capillary: 166 mg/dL — ABNORMAL HIGH (ref 70–99)
Glucose-Capillary: 158 mg/dL — ABNORMAL HIGH (ref 70–99)
Glucose-Capillary: 137 mg/dL — ABNORMAL HIGH (ref 70–99)
Glucose-Capillary: 187 mg/dL — ABNORMAL HIGH (ref 70–99)
Glucose-Capillary: 137 mg/dL — ABNORMAL HIGH (ref 70–99)

## 2023-06-26 LAB — CBC
HCT: 41 % (ref 39.0–52.0)
Hemoglobin: 13.7 g/dL (ref 13.0–17.0)
MCH: 28 pg (ref 26.0–34.0)
MCHC: 33.4 g/dL (ref 30.0–36.0)
MCV: 83.7 fL (ref 80.0–100.0)
Platelets: 204 10*3/uL (ref 150–400)
RBC: 4.9 MIL/uL (ref 4.22–5.81)
RDW: 15.4 % (ref 11.5–15.5)
WBC: 10.1 10*3/uL (ref 4.0–10.5)
nRBC: 0 % (ref 0.0–0.2)

## 2023-06-26 LAB — BASIC METABOLIC PANEL
Anion gap: 7 (ref 5–15)
BUN: 13 mg/dL (ref 8–23)
CO2: 25 mmol/L (ref 22–32)
Calcium: 8.9 mg/dL (ref 8.9–10.3)
Chloride: 106 mmol/L (ref 98–111)
Creatinine, Ser: 0.78 mg/dL (ref 0.61–1.24)
GFR, Estimated: >60 mL/min (ref >60)
Glucose, Bld: 182 mg/dL — ABNORMAL HIGH (ref 70–99)
Potassium: 4.1 mmol/L (ref 3.5–5.1)
Sodium: 138 mmol/L (ref 135–145)

## 2023-06-26 LAB — APTT
aPTT: 105 seconds — ABNORMAL HIGH (ref 24–36)
aPTT: 140 seconds — ABNORMAL HIGH (ref 24–36)

## 2023-06-26 LAB — HIV ANTIBODY (ROUTINE TESTING W REFLEX): HIV Screen 4th Generation wRfx: NONREACTIVE

## 2023-06-26 SURGERY — LOWER EXTREMITY ANGIOGRAPHY
Anesthesia: Moderate Sedation | Laterality: Right

## 2023-06-26 MED ORDER — DIPHENHYDRAMINE HCL 50 MG/ML IJ SOLN: 50.0000 mg | Freq: Once | INTRAMUSCULAR | Status: DC | PRN

## 2023-06-26 MED ORDER — METHYLPREDNISOLONE SODIUM SUCC 125 MG IJ SOLR: 125.0000 mg | Freq: Once | INTRAMUSCULAR | Status: DC | PRN

## 2023-06-26 MED ORDER — MIDAZOLAM HCL 2 MG/ML PO SYRP: 8.0000 mg | ORAL_SOLUTION | Freq: Once | ORAL | Status: DC | PRN

## 2023-06-26 MED ORDER — CLOPIDOGREL BISULFATE 75 MG PO TABS
300.0000 mg | ORAL_TABLET | ORAL | Status: AC
  Administered 2023-06-26: 300 mg via ORAL
  Filled 2023-06-26: qty 4

## 2023-06-26 MED ORDER — SODIUM CHLORIDE 0.9% FLUSH
3.0000 mL | Freq: Two times a day (BID) | INTRAVENOUS | Status: DC
  Administered 2023-06-26 – 2023-07-01 (×11): 3 mL via INTRAVENOUS

## 2023-06-26 MED ORDER — ACETAMINOPHEN 325 MG PO TABS
650.0000 mg | ORAL_TABLET | ORAL | Status: DC | PRN
  Administered 2023-06-29: 650 mg via ORAL
  Filled 2023-06-26: qty 2

## 2023-06-26 MED ORDER — SODIUM CHLORIDE 0.9 % IV SOLN: 250.0000 mL | INTRAVENOUS | Status: DC | PRN

## 2023-06-26 MED ORDER — IODIXANOL 320 MG/ML IV SOLN
INTRAVENOUS | Status: DC | PRN
  Administered 2023-06-26: 55 mL via INTRA_ARTERIAL

## 2023-06-26 MED ORDER — APIXABAN 2.5 MG PO TABS
2.5000 mg | ORAL_TABLET | Freq: Two times a day (BID) | ORAL | Status: DC
  Administered 2023-06-26 – 2023-07-01 (×10): 2.5 mg via ORAL
  Filled 2023-06-26 (×10): qty 1

## 2023-06-26 MED ORDER — CEFAZOLIN SODIUM-DEXTROSE 1-4 GM/50ML-% IV SOLN
INTRAVENOUS | Status: DC | PRN
  Administered 2023-06-26: 2 g via INTRAVENOUS

## 2023-06-26 MED ORDER — ONDANSETRON HCL 4 MG/2ML IJ SOLN: 4.0000 mg | Freq: Four times a day (QID) | INTRAMUSCULAR | Status: DC | PRN

## 2023-06-26 MED ORDER — SODIUM CHLORIDE 0.9 % IV SOLN: INTRAVENOUS | Status: DC

## 2023-06-26 MED ORDER — HEPARIN SODIUM (PORCINE) 1000 UNIT/ML IJ SOLN
INTRAMUSCULAR | Status: DC | PRN
  Administered 2023-06-26: 4000 [IU] via INTRAVENOUS
  Administered 2023-06-26: 2000 [IU] via INTRAVENOUS

## 2023-06-26 MED ORDER — ASPIRIN 81 MG PO TBEC: 81.0000 mg | DELAYED_RELEASE_TABLET | Freq: Every day | ORAL | Status: DC

## 2023-06-26 MED ORDER — LIDOCAINE HCL (PF) 1 % IJ SOLN
INTRAMUSCULAR | Status: DC | PRN
  Administered 2023-06-26: 10 mL via INTRADERMAL

## 2023-06-26 MED ORDER — HEPARIN SODIUM (PORCINE) 1000 UNIT/ML IJ SOLN
INTRAMUSCULAR | Status: AC
  Filled 2023-06-26: qty 10

## 2023-06-26 MED ORDER — FAMOTIDINE 20 MG PO TABS: 40.0000 mg | ORAL_TABLET | Freq: Once | ORAL | Status: DC | PRN

## 2023-06-26 MED ORDER — FENTANYL CITRATE (PF) 100 MCG/2ML IJ SOLN
INTRAMUSCULAR | Status: DC | PRN
  Administered 2023-06-26: 50 ug via INTRAVENOUS

## 2023-06-26 MED ORDER — FENTANYL CITRATE (PF) 100 MCG/2ML IJ SOLN
INTRAMUSCULAR | Status: AC
  Filled 2023-06-26: qty 2

## 2023-06-26 MED ORDER — FENTANYL CITRATE PF 50 MCG/ML IJ SOSY: 12.5000 ug | PREFILLED_SYRINGE | Freq: Once | INTRAMUSCULAR | Status: DC | PRN

## 2023-06-26 MED ORDER — HEPARIN (PORCINE) IN NACL 2000-0.9 UNIT/L-% IV SOLN
INTRAVENOUS | Status: DC | PRN
  Administered 2023-06-26: 1000 mL

## 2023-06-26 MED ORDER — ASPIRIN 325 MG PO TBEC
325.0000 mg | DELAYED_RELEASE_TABLET | ORAL | Status: AC
  Administered 2023-06-26: 325 mg via ORAL
  Filled 2023-06-26: qty 1

## 2023-06-26 MED ORDER — HYDROMORPHONE HCL 1 MG/ML IJ SOLN: 1.0000 mg | Freq: Once | INTRAMUSCULAR | Status: DC | PRN

## 2023-06-26 MED ORDER — MIDAZOLAM HCL 2 MG/2ML IJ SOLN
INTRAMUSCULAR | Status: DC | PRN
  Administered 2023-06-26: 2 mg via INTRAVENOUS

## 2023-06-26 MED ORDER — SODIUM CHLORIDE 0.9% FLUSH: 3.0000 mL | INTRAVENOUS | Status: DC | PRN

## 2023-06-26 MED ORDER — MORPHINE SULFATE (PF) 4 MG/ML IV SOLN
2.0000 mg | INTRAVENOUS | Status: DC | PRN
  Administered 2023-06-27: 2 mg via INTRAVENOUS
  Filled 2023-06-26: qty 1

## 2023-06-26 MED ORDER — MIDAZOLAM HCL 5 MG/5ML IJ SOLN
INTRAMUSCULAR | Status: AC
  Filled 2023-06-26: qty 5

## 2023-06-26 MED ORDER — CLOPIDOGREL BISULFATE 75 MG PO TABS
75.0000 mg | ORAL_TABLET | Freq: Every day | ORAL | Status: DC
  Administered 2023-06-27 – 2023-07-01 (×5): 75 mg via ORAL
  Filled 2023-06-26 (×6): qty 1

## 2023-06-26 MED ORDER — CEFAZOLIN SODIUM-DEXTROSE 2-4 GM/100ML-% IV SOLN
INTRAVENOUS | Status: AC
  Filled 2023-06-26: qty 100

## 2023-06-26 MED ORDER — CEFAZOLIN SODIUM-DEXTROSE 2-4 GM/100ML-% IV SOLN
2.0000 g | INTRAVENOUS | Status: DC
  Filled 2023-06-26: qty 100

## 2023-06-26 MED ORDER — OXYCODONE HCL 5 MG PO TABS
5.0000 mg | ORAL_TABLET | ORAL | Status: DC | PRN
  Administered 2023-06-26 – 2023-07-01 (×13): 10 mg via ORAL
  Filled 2023-06-26 (×14): qty 2

## 2023-06-26 SURGICAL SUPPLY — 23 items
BALLN ULTRASCOR 014 2.5X40X150 (BALLOONS) ×1 IMPLANT
BALLOON ULTRSCR 014 2.5X40X150 (BALLOONS) IMPLANT
CATH ANGIO 5F PIGTAIL 65CM (CATHETERS) IMPLANT
CATH BEACON 5 .035 65 KMP TIP (CATHETERS) IMPLANT
CATH SEEKER .018X150 (CATHETERS) IMPLANT
COVER PROBE ULTRASOUND 5X96 (MISCELLANEOUS) IMPLANT
DEVICE STARCLOSE SE CLOSURE (Vascular Products) IMPLANT
GLIDEWIRE ADV .014X300CM (WIRE) IMPLANT
GLIDEWIRE ADV .035X260CM (WIRE) IMPLANT
GOWN STRL REUS W/ TWL LRG LVL3 (GOWN DISPOSABLE) ×1 IMPLANT
GOWN STRL REUS W/TWL LRG LVL3 (GOWN DISPOSABLE) ×1
GUIDEWIRE ADV .018X180CM (WIRE) IMPLANT
GUIDEWIRE PFTE-COATED .018X300 (WIRE) IMPLANT
KIT ENCORE 26 ADVANTAGE (KITS) IMPLANT
NDL ENTRY 21GA 7CM ECHOTIP (NEEDLE) IMPLANT
NEEDLE ENTRY 21GA 7CM ECHOTIP (NEEDLE) ×1 IMPLANT
PACK ANGIOGRAPHY (CUSTOM PROCEDURE TRAY) ×1 IMPLANT
SET INTRO CAPELLA COAXIAL (SET/KITS/TRAYS/PACK) IMPLANT
SHEATH ANL 5FRX45 (SHEATH) IMPLANT
SHEATH BRITE TIP 5FRX11 (SHEATH) IMPLANT
SYR MEDRAD MARK 7 150ML (SYRINGE) IMPLANT
TUBING CONTRAST HIGH PRESS 72 (TUBING) IMPLANT
WIRE GUIDERIGHT .035X150 (WIRE) IMPLANT

## 2023-06-26 NOTE — Progress Notes (Signed)
Hospital Consult       Reason for Consult:  Right lower extremity Ischemia Requesting Physician:  Dr Lorretta Harp MRN #:  [id number]   History of Present Illness: This is a 67 y.o. male who presents to [facility] with right lower extremity ischemia. Resting pain started 3 days ago.  Well known to vascular surgery. Vascular Surgery consulted to evaluate.        Past Medical History:  Diagnosis Date   Coronary artery disease     Diabetes (HCC)     DVT (deep venous thrombosis) (HCC)     Gangrene of left foot (HCC) 02/09/2018   GERD (gastroesophageal reflux disease)     Hyperlipidemia     Hypertension     Peripheral vascular disease (HCC)                 Past Surgical History:  Procedure Laterality Date   ABDOMINAL AORTOGRAM W/LOWER EXTREMITY N/A 02/20/2022    Procedure: ABDOMINAL AORTOGRAM W/LOWER EXTREMITY;  Surgeon: Cephus Shelling, MD;  Location: [facility];  Service: Cardiovascular;  Laterality: N/A;   AMPUTATION Left 10/28/2018    Procedure: AMPUTATION BELOW KNEE;  Surgeon: Annice Needy, MD;  Location: [facility];  Service: Vascular;  Laterality: Left;   AMPUTATION Left 09/10/2021    Procedure: AMPUTATION DIGIT;  Surgeon: Allena Napoleon, MD;  Location: [facility];  Service: Plastics;  Laterality: Left;   AMPUTATION Left 09/14/2021    Procedure: AMPUTATION DIGIT left long finger and irrigation and debridement;  Surgeon: Allena Napoleon, MD;  Location: [facility];  Service: Plastics;  Laterality: Left;   AMPUTATION Right 02/27/2022    Procedure: RIGHT PARTIAL AMPUTATION FOOT;  Surgeon: Felecia Shelling, DPM;  Location: [facility];  Service: Podiatry;  Laterality: Right;   AMPUTATION TOE Left 02/10/2018    Procedure: AMPUTATION TOE;  Surgeon: Linus Galas, DPM;  Location: [facility];  Service: Podiatry;  Laterality: Left;   APPLICATION OF WOUND VAC Left 10/16/2018    Procedure: APPLICATION OF WOUND VAC;  Surgeon: Gwyneth Revels, DPM;  Location: [facility];  Service: Podiatry;  Laterality: Left;    DORSAL SLIT N/A 01/12/2021    Procedure: DORSAL SLIT;  Surgeon: Sondra Come, MD;  Location: [facility];  Service: Urology;  Laterality: N/A;   FEMORAL-TIBIAL BYPASS GRAFT Right 02/25/2022    Procedure: RIGHT LOWER EXTREMITY FEMORAL TO TIBIAL PERONEAL TRUNK BYPASS;  Surgeon: Cephus Shelling, MD;  Location: [facility];  Service: Vascular;  Laterality: Right;  INSERT ARTERIAL LINE   groin surgery       IRRIGATION AND DEBRIDEMENT FOOT Left 09/30/2018    Procedure: IRRIGATION AND DEBRIDEMENT FOOT;  Surgeon: Linus Galas, DPM;  Location: [facility];  Service: Podiatry;  Laterality: Left;   LOWER EXTREMITY ANGIOGRAPHY Left 02/12/2018    Procedure: Lower Extremity Angiography;  Surgeon: Annice Needy, MD;  Location: [facility];  Service: Cardiovascular;  Laterality: Left;   LOWER EXTREMITY ANGIOGRAPHY Left 10/01/2018    Procedure: Lower Extremity Angiography;  Surgeon: Annice Needy, MD;  Location: [facility];  Service: Cardiovascular;  Laterality: Left;   LOWER EXTREMITY ANGIOGRAPHY Left 10/19/2018    Procedure: Lower Extremity Angiography;  Surgeon: Annice Needy, MD;  Location: [facility];  Service: Cardiovascular;  Laterality: Left;   LOWER EXTREMITY ANGIOGRAPHY Left 10/20/2018    Procedure: LOWER EXTREMITY ANGIOGRAPHY;  Surgeon: Annice Needy, MD;  Location: [facility];  Service: Cardiovascular;  Laterality: Left;  LOWER EXTREMITY ANGIOGRAPHY Right 11/25/2018    Procedure: LOWER EXTREMITY ANGIOGRAPHY;  Surgeon: Annice Needy, MD;  Location: [facility];  Service: Cardiovascular;  Laterality: Right;   LOWER EXTREMITY ANGIOGRAPHY Right 01/10/2020    Procedure: LOWER EXTREMITY ANGIOGRAPHY;  Surgeon: Annice Needy, MD;  Location: [facility];  Service: Cardiovascular;  Laterality: Right;   LOWER EXTREMITY ANGIOGRAPHY Right 12/11/2020    Procedure: LOWER EXTREMITY ANGIOGRAPHY;  Surgeon: Annice Needy, MD;  Location: [facility];  Service: Cardiovascular;   Laterality: Right;   LOWER EXTREMITY INTERVENTION   02/12/2018    Procedure: LOWER EXTREMITY INTERVENTION;  Surgeon: Annice Needy, MD;  Location: [facility];  Service: Cardiovascular;;   MINOR IRRIGATION AND DEBRIDEMENT OF WOUND Left 09/10/2021    Procedure: IRRIGATION AND DEBRIDEMENT OF LEFT LONG FINGER WOUND;  Surgeon: Allena Napoleon, MD;  Location: [facility];  Service: Plastics;  Laterality: Left;   NECK SURGERY       PERIPHERAL VASCULAR INTERVENTION   02/20/2022    Procedure: PERIPHERAL VASCULAR INTERVENTION;  Surgeon: Cephus Shelling, MD;  Location: [facility];  Service: Cardiovascular;;  right iliac   TRANSMETATARSAL AMPUTATION Left 10/16/2018    Procedure: TRANSMETATARSAL AMPUTATION LEFT FOOT;  Surgeon: Gwyneth Revels, DPM;  Location: [facility];  Service: Podiatry;  Laterality: Left;   VEIN HARVEST Right 02/25/2022    Procedure: VEIN HARVEST OF RIGHT GREATER SAPHENOUS VEIN;  Surgeon: Cephus Shelling, MD;  Location: [facility];  Service: Vascular;  Laterality: Right;          Allergies  No Known Allergies            Prior to Admission medications   Medication Sig Start Date End Date Taking? Authorizing Provider  acidophilus (RISAQUAD) CAPS capsule Take 1 capsule by mouth daily after supper. 03/21/22   Yes Love, Evlyn Kanner, PA-C  apixaban (ELIQUIS) 5 MG TABS tablet Take 1 tablet (5 mg total) by mouth 2 (two) times daily. 03/21/22   Yes Love, Evlyn Kanner, PA-C  ascorbic acid (VITAMIN C) 500 MG tablet Take 1 tablet (500 mg total) by mouth 2 (two) times daily. 03/21/22   Yes Love, Evlyn Kanner, PA-C  aspirin EC 81 MG tablet Take 1 tablet (81 mg total) by mouth daily. 01/10/20   Yes Dew, Marlow Baars, MD  atorvastatin (LIPITOR) 80 MG tablet Take 1 tablet (80 mg total) by mouth daily at 6 PM. 03/21/22   Yes Love, Evlyn Kanner, PA-C  Cholecalciferol (VITAMIN D3) 125 MCG (5000 UT) CAPS Take 1 capsule by mouth daily. 04/17/22   Yes [provider]  insulin glargine (LANTUS) 100 UNIT/ML  Solostar Pen Inject 41 Units into the skin 2 (two) times daily. Patient taking differently: Inject 40 Units into the skin at bedtime. 03/21/22   Yes Love, Evlyn Kanner, PA-C  insulin lispro (HUMALOG) 100 UNIT/ML injection Inject 13 Units into the skin 3 (three) times daily before meals.     Yes [provider]  Insulin Lispro w/ Trans Port 100 UNIT/ML SOPN 24 Units 3 (three) times daily. 12/08/19   Yes [provider]  losartan (COZAAR) 25 MG tablet Take 1 tablet (25 mg total) by mouth daily. 03/21/22   Yes Love, Evlyn Kanner, PA-C  magnesium oxide (MAG-OX) 400 MG tablet Take 0.5 tablets (200 mg total) by mouth daily. 03/21/22   Yes Love, Evlyn Kanner, PA-C  methocarbamol (ROBAXIN) 500 MG tablet Take 1 tablet (500 mg total) by  mouth every 6 (six) hours as needed for muscle spasms. 03/21/22   Yes Love, Evlyn Kanner, PA-C  NICORETTE STARTER KIT 4 MG gum Take 4 mg by mouth as needed for smoking cessation. 02/05/23   Yes [provider]  polyethylene glycol powder (GLYCOLAX/MIRALAX) 17 GM/SCOOP powder Take 34 g by mouth daily. 01/16/23   Yes Brimage, Vondra, DO  TRULICITY 0.75 MG/0.5ML SOPN Inject into the skin. 03/22/22   Yes [provider]  Zinc Sulfate 220 (50 Zn) MG TABS Take 1 tablet (220 mg total) by mouth daily after supper. 03/21/22   Yes Love, Evlyn Kanner, PA-C  clopidogrel (PLAVIX) 75 MG tablet Take 1 tablet (75 mg total) by mouth daily at 6 (six) AM. Patient not taking: Reported on 06/25/2022 03/21/22     Love, Evlyn Kanner, PA-C  leptospermum manuka honey (MEDIHONEY) PSTE paste Apply 1 Application topically daily. Patient not taking: Reported on 06/25/2022 03/21/22     Love, Evlyn Kanner, PA-C  oxyCODONE (OXY IR/ROXICODONE) 5 MG immediate release tablet Take 1 tablet (5 mg total) by mouth 2 (two) times daily as needed for severe pain. Patient not taking: Reported on 06/25/2022 03/21/22     Love, Evlyn Kanner, PA-C  pantoprazole (PROTONIX) 40 MG tablet Take 1 tablet (40 mg total) by mouth  daily. Patient not taking: Reported on 06/25/2022 03/21/22     Love, Evlyn Kanner, PA-C  Vitamin D, Ergocalciferol, (DRISDOL) 1.25 MG (50000 UNIT) CAPS capsule Take 1 capsule (50,000 Units total) by mouth every 7 (seven) days. Patient not taking: Reported on 06/25/2022 03/23/22     Love, Evlyn Kanner, PA-C      Social History         Socioeconomic History   Marital status: Divorced      Spouse name: Not on file   Number of children: Not on file   Years of education: Not on file   Highest education level: Not on file  Occupational History   Not on file  Tobacco Use   Smoking status: Former      Current packs/day: 0.00      Average packs/day: 1 pack/day for 47.0 years (47.0 ttl pk-yrs)      Types: Cigarettes      Start date: 01/1974      Quit date: 01/2021      Years since quitting: 2.4   Smokeless tobacco: Never  Vaping Use   Vaping status: Never Used  Substance and Sexual Activity   Alcohol use: No   Drug use: No   Sexual activity: Not Currently  Other Topics Concern   Not on file  Social History Narrative   Not on file    Social Determinants of Health        Financial Resource Strain: Not on file  Food Insecurity: No Food Insecurity (06/25/2023)    Hunger Vital Sign     Worried About Running Out of Food in the Last Year: Never true     Ran Out of Food in the Last Year: Never true  Transportation Needs: No Transportation Needs (06/25/2023)    PRAPARE - Therapist, art (Medical): No     Lack of Transportation (Non-Medical): No  Physical Activity: Not on file  Stress: Not on file  Social Connections: Not on file  Intimate Partner Violence: Not At Risk (06/25/2023)    Humiliation, Afraid, Rape, and Kick questionnaire     Fear of Current or Ex-Partner: No     Emotionally  Abused: No     Physically Abused: No     Sexually Abused: No             Family History  Problem Relation Age of Onset   Diabetes Mother     Coronary artery disease Father      Hypertension Sister     Leukemia Brother            ROS: Otherwise negative unless mentioned in HPI   Physical Examination       Vitals:    06/26/23 0308 06/26/23 0742  BP: (!) 154/72 (!) 122/51  Pulse: (!) 54 63  Resp:   18  Temp: 97.6 F (36.4 C) 97.6 F (36.4 C)  SpO2: 100% 100%    Body mass index is 33.91 kg/m.   General:  WDWN in NAD Gait: Not observed HENT: WNL, normocephalic Pulmonary: normal non-labored breathing, without Rales, rhonchi,  wheezing Cardiac: regular, without  Murmurs, rubs or gallops; with carotid bruits Abdomen: Positive bowel Sounds,  soft, NT/ND, no masses Skin: without rashes Vascular Exam/Pulses: No pulses right lower  Extremities: with ischemic changes, without Gangrene , without cellulitis; without open wounds;  Musculoskeletal: no muscle wasting or atrophy       Neurologic: A&O X 3;  No focal weakness or paresthesias are detected; speech is fluent/normal Psychiatric:  The pt has Normal affect. Lymph:  Unremarkable   CBC Labs (Brief)          Component Value Date/Time    WBC 10.1 06/26/2023 0417    RBC 4.90 06/26/2023 0417    HGB 13.7 06/26/2023 0417    HGB 14.1 11/17/2013 0621    HCT 41.0 06/26/2023 0417    HCT 41.0 02/16/2022 0608    PLT 204 06/26/2023 0417    PLT 218 11/17/2013 0621    MCV 83.7 06/26/2023 0417    MCV 86 11/17/2013 0621    MCH 28.0 06/26/2023 0417    MCHC 33.4 06/26/2023 0417    RDW 15.4 06/26/2023 0417    RDW 13.6 11/17/2013 0621    LYMPHSABS 2.7 06/25/2023 1610    LYMPHSABS 2.4 11/17/2013 0621    MONOABS 0.6 06/25/2023 1610    MONOABS 0.9 11/17/2013 0621    EOSABS 0.0 06/25/2023 1610    EOSABS 0.1 11/17/2013 0621    BASOSABS 0.0 06/25/2023 1610    BASOSABS 0.0 11/17/2013 0621        BMET Labs (Brief)          Component Value Date/Time    NA 138 06/26/2023 0417    NA 135 (L) 11/17/2013 0621    K 4.1 06/26/2023 0417    K 4.3 11/17/2013 0621    CL 106 06/26/2023 0417    CL 99 11/17/2013 0621     CO2 25 06/26/2023 0417    CO2 31 11/17/2013 0621    GLUCOSE 182 (H) 06/26/2023 0417    GLUCOSE 172 (H) 11/17/2013 0621    BUN 13 06/26/2023 0417    BUN 10 11/17/2013 0621    CREATININE 0.78 06/26/2023 0417    CREATININE 1.05 04/18/2022 1034    CALCIUM 8.9 06/26/2023 0417    CALCIUM 9.6 11/17/2013 0621    GFRNONAA >60 06/26/2023 0417    GFRNONAA >60 11/17/2013 0621    GFRAA >60 01/10/2020 0808    GFRAA >60 11/17/2013 7846        COAGS: Recent Labs       Lab Results  Component Value Date  INR 1.2 06/25/2023    INR 1.3 (H) 02/15/2022    INR 1.2 03/31/2021          Non-Invasive Vascular Imaging:   None ordered   Statin:  Yes.   Beta Blocker:  No. Aspirin:  Yes.   ACEI:  No. ARB:  No. CCB use:  No Other antiplatelets/anticoagulants:  Yes.   Eliquis 5 mg twice daily     ASSESSMENT/PLAN: This is a 67 y.o. male who presents to [facility] with right lower extremity resting pain due to ischemia. Patient has long history  of vascular intervention with femoral to tibial bypass back in May.     Vascular Surgery plans on taking the patient to the lab for a right lower extremity angiogram with possible intervention. I discussed in detail with the patient the procedure, benefits, risks and complications. Patent wishes to proceed. Patient has been NPO since midnight.      -I discussed the plan in detail with Dr Vilinda Flake MD and he agrees with the plan.      Marcie Bal [facility] 06/26/2023 7:50 AM          Electronically signed by Marcie Bal, NP at 06/26/2023  9:29 AM

## 2023-06-26 NOTE — Plan of Care (Signed)
  Problem: Coping: Goal: Ability to adjust to condition or change in health will improve Outcome: Progressing   Problem: Health Behavior/Discharge Planning: Goal: Ability to identify and utilize available resources and services will improve Outcome: Progressing   Problem: Tissue Perfusion: Goal: Adequacy of tissue perfusion will improve Outcome: Progressing   Problem: Clinical Measurements: Goal: Diagnostic test results will improve Outcome: Progressing

## 2023-06-26 NOTE — H&P (View-Only) (Signed)
Hospital Consult       Reason for Consult:  Right lower extremity Ischemia Requesting Physician:  Dr Lorretta Harp MRN #:  [id number]   History of Present Illness: This is a 67 y.o. male who presents to [facility] with right lower extremity ischemia. Resting pain started 3 days ago.  Well known to vascular surgery. Vascular Surgery consulted to evaluate.        Past Medical History:  Diagnosis Date   Coronary artery disease     Diabetes (HCC)     DVT (deep venous thrombosis) (HCC)     Gangrene of left foot (HCC) 02/09/2018   GERD (gastroesophageal reflux disease)     Hyperlipidemia     Hypertension     Peripheral vascular disease (HCC)                 Past Surgical History:  Procedure Laterality Date   ABDOMINAL AORTOGRAM W/LOWER EXTREMITY N/A 02/20/2022    Procedure: ABDOMINAL AORTOGRAM W/LOWER EXTREMITY;  Surgeon: Cephus Shelling, MD;  [facility]: [facility];  Service: Cardiovascular;  Laterality: N/A;   AMPUTATION Left 10/28/2018    Procedure: AMPUTATION BELOW KNEE;  Surgeon: Annice Needy, MD;  Location: [facility];  Service: Vascular;  Laterality: Left;   AMPUTATION Left 09/10/2021    Procedure: AMPUTATION DIGIT;  Surgeon: Allena Napoleon, MD;  Location: [facility];  Service: Plastics;  Laterality: Left;   AMPUTATION Left 09/14/2021    Procedure: AMPUTATION DIGIT left long finger and irrigation and debridement;  Surgeon: Allena Napoleon, MD;  Location: [facility];  Service: Plastics;  Laterality: Left;   AMPUTATION Right 02/27/2022    Procedure: RIGHT PARTIAL AMPUTATION FOOT;  Surgeon: Felecia Shelling, DPM;  Location: [facility];  Service: Podiatry;  Laterality: Right;   AMPUTATION TOE Left 02/10/2018    Procedure: AMPUTATION TOE;  Surgeon: Linus Galas, DPM;  Location: [facility];  Service: Podiatry;  Laterality: Left;   APPLICATION OF WOUND VAC Left 10/16/2018    Procedure: APPLICATION OF WOUND VAC;  Surgeon: Gwyneth Revels, DPM;  Location: [facility];  Service: Podiatry;  Laterality: Left;    DORSAL SLIT N/A 01/12/2021    Procedure: DORSAL SLIT;  Surgeon: Sondra Come, MD;  Location: [facility];  Service: Urology;  Laterality: N/A;   FEMORAL-TIBIAL BYPASS GRAFT Right 02/25/2022    Procedure: RIGHT LOWER EXTREMITY FEMORAL TO TIBIAL PERONEAL TRUNK BYPASS;  Surgeon: Cephus Shelling, MD;  Location: [facility];  Service: Vascular;  Laterality: Right;  INSERT ARTERIAL LINE   groin surgery       IRRIGATION AND DEBRIDEMENT FOOT Left 09/30/2018    Procedure: IRRIGATION AND DEBRIDEMENT FOOT;  Surgeon: Linus Galas, DPM;  Location: [facility];  Service: Podiatry;  Laterality: Left;   LOWER EXTREMITY ANGIOGRAPHY Left 02/12/2018    Procedure: Lower Extremity Angiography;  Surgeon: Annice Needy, MD;  Location: [facility];  Service: Cardiovascular;  Laterality: Left;   LOWER EXTREMITY ANGIOGRAPHY Left 10/01/2018    Procedure: Lower Extremity Angiography;  Surgeon: Annice Needy, MD;  Location: [facility];  Service: Cardiovascular;  Laterality: Left;   LOWER EXTREMITY ANGIOGRAPHY Left 10/19/2018    Procedure: Lower Extremity Angiography;  Surgeon: Annice Needy, MD;  Location: [facility];  Service: Cardiovascular;  Laterality: Left;   LOWER EXTREMITY ANGIOGRAPHY Left 10/20/2018    Procedure: LOWER EXTREMITY ANGIOGRAPHY;  Surgeon: Annice Needy, MD;  Location: [facility];  Service: Cardiovascular;  Laterality: Left;  LOWER EXTREMITY ANGIOGRAPHY Right 11/25/2018    Procedure: LOWER EXTREMITY ANGIOGRAPHY;  Surgeon: Annice Needy, MD;  Location: [facility];  Service: Cardiovascular;  Laterality: Right;   LOWER EXTREMITY ANGIOGRAPHY Right 01/10/2020    Procedure: LOWER EXTREMITY ANGIOGRAPHY;  Surgeon: Annice Needy, MD;  Location: [facility];  Service: Cardiovascular;  Laterality: Right;   LOWER EXTREMITY ANGIOGRAPHY Right 12/11/2020    Procedure: LOWER EXTREMITY ANGIOGRAPHY;  Surgeon: Annice Needy, MD;  Location: [facility];  Service: Cardiovascular;   Laterality: Right;   LOWER EXTREMITY INTERVENTION   02/12/2018    Procedure: LOWER EXTREMITY INTERVENTION;  Surgeon: Annice Needy, MD;  Location: [facility];  Service: Cardiovascular;;   MINOR IRRIGATION AND DEBRIDEMENT OF WOUND Left 09/10/2021    Procedure: IRRIGATION AND DEBRIDEMENT OF LEFT LONG FINGER WOUND;  Surgeon: Allena Napoleon, MD;  Location: [facility];  Service: Plastics;  Laterality: Left;   NECK SURGERY       PERIPHERAL VASCULAR INTERVENTION   02/20/2022    Procedure: PERIPHERAL VASCULAR INTERVENTION;  Surgeon: Cephus Shelling, MD;  [facility]: [facility];  Service: Cardiovascular;;  right iliac   TRANSMETATARSAL AMPUTATION Left 10/16/2018    Procedure: TRANSMETATARSAL AMPUTATION LEFT FOOT;  Surgeon: Gwyneth Revels, DPM;  Location: [facility];  Service: Podiatry;  Laterality: Left;   VEIN HARVEST Right 02/25/2022    Procedure: VEIN HARVEST OF RIGHT GREATER SAPHENOUS VEIN;  Surgeon: Cephus Shelling, MD;  Location: [facility] OR;  Service: Vascular;  Laterality: Right;          Allergies  No Known Allergies            Prior to Admission medications   Medication Sig Start Date End Date Taking? Authorizing Provider  acidophilus (RISAQUAD) CAPS capsule Take 1 capsule by mouth daily after supper. 03/21/22   Yes Love, Evlyn Kanner, PA-C  apixaban (ELIQUIS) 5 MG TABS tablet Take 1 tablet (5 mg total) by mouth 2 (two) times daily. 03/21/22   Yes Love, Evlyn Kanner, PA-C  ascorbic acid (VITAMIN C) 500 MG tablet Take 1 tablet (500 mg total) by mouth 2 (two) times daily. 03/21/22   Yes Love, Evlyn Kanner, PA-C  aspirin EC 81 MG tablet Take 1 tablet (81 mg total) by mouth daily. 01/10/20   Yes Dew, Marlow Baars, MD  atorvastatin (LIPITOR) 80 MG tablet Take 1 tablet (80 mg total) by mouth daily at 6 PM. 03/21/22   Yes Love, Evlyn Kanner, PA-C  Cholecalciferol (VITAMIN D3) 125 MCG (5000 UT) CAPS Take 1 capsule by mouth daily. 04/17/22   Yes [provider]  insulin glargine (LANTUS) 100 UNIT/ML  Solostar Pen Inject 41 Units into the skin 2 (two) times daily. Patient taking differently: Inject 40 Units into the skin at bedtime. 03/21/22   Yes Love, Evlyn Kanner, PA-C  insulin lispro (HUMALOG) 100 UNIT/ML injection Inject 13 Units into the skin 3 (three) times daily before meals.     Yes [provider]  Insulin Lispro w/ Trans Port 100 UNIT/ML SOPN 24 Units 3 (three) times daily. 12/08/19   Yes [provider]  losartan (COZAAR) 25 MG tablet Take 1 tablet (25 mg total) by mouth daily. 03/21/22   Yes Love, Evlyn Kanner, PA-C  magnesium oxide (MAG-OX) 400 MG tablet Take 0.5 tablets (200 mg total) by mouth daily. 03/21/22   Yes Love, Evlyn Kanner, PA-C  methocarbamol (ROBAXIN) 500 MG tablet Take 1 tablet (500 mg total) by  mouth every 6 (six) hours as needed for muscle spasms. 03/21/22   Yes Love, Evlyn Kanner, PA-C  NICORETTE STARTER KIT 4 MG gum Take 4 mg by mouth as needed for smoking cessation. 02/05/23   Yes [provider]  polyethylene glycol powder (GLYCOLAX/MIRALAX) 17 GM/SCOOP powder Take 34 g by mouth daily. 01/16/23   Yes Brimage, Vondra, DO  TRULICITY 0.75 MG/0.5ML SOPN Inject into the skin. 03/22/22   Yes [provider]  Zinc Sulfate 220 (50 Zn) MG TABS Take 1 tablet (220 mg total) by mouth daily after supper. 03/21/22   Yes Love, Evlyn Kanner, PA-C  clopidogrel (PLAVIX) 75 MG tablet Take 1 tablet (75 mg total) by mouth daily at 6 (six) AM. Patient not taking: Reported on 06/25/2022 03/21/22     Love, Evlyn Kanner, PA-C  leptospermum manuka honey (MEDIHONEY) PSTE paste Apply 1 Application topically daily. Patient not taking: Reported on 06/25/2022 03/21/22     Love, Evlyn Kanner, PA-C  oxyCODONE (OXY IR/ROXICODONE) 5 MG immediate release tablet Take 1 tablet (5 mg total) by mouth 2 (two) times daily as needed for severe pain. Patient not taking: Reported on 06/25/2022 03/21/22     Love, Evlyn Kanner, PA-C  pantoprazole (PROTONIX) 40 MG tablet Take 1 tablet (40 mg total) by mouth  daily. Patient not taking: Reported on 06/25/2022 03/21/22     Love, Evlyn Kanner, PA-C  Vitamin D, Ergocalciferol, (DRISDOL) 1.25 MG (50000 UNIT) CAPS capsule Take 1 capsule (50,000 Units total) by mouth every 7 (seven) days. Patient not taking: Reported on 06/25/2022 03/23/22     Love, Evlyn Kanner, PA-C      Social History         Socioeconomic History   Marital status: Divorced      Spouse name: Not on file   Number of children: Not on file   Years of education: Not on file   Highest education level: Not on file  Occupational History   Not on file  Tobacco Use   Smoking status: Former      Current packs/day: 0.00      Average packs/day: 1 pack/day for 47.0 years (47.0 ttl pk-yrs)      Types: Cigarettes      Start date: 01/1974      Quit date: 01/2021      Years since quitting: 2.4   Smokeless tobacco: Never  Vaping Use   Vaping status: Never Used  Substance and Sexual Activity   Alcohol use: No   Drug use: No   Sexual activity: Not Currently  Other Topics Concern   Not on file  Social History Narrative   Not on file    Social Determinants of Health        Financial Resource Strain: Not on file  Food Insecurity: No Food Insecurity (06/25/2023)    Hunger Vital Sign     Worried About Running Out of Food in the Last Year: Never true     Ran Out of Food in the Last Year: Never true  Transportation Needs: No Transportation Needs (06/25/2023)    PRAPARE - Therapist, art (Medical): No     Lack of Transportation (Non-Medical): No  Physical Activity: Not on file  Stress: Not on file  Social Connections: Not on file  Intimate Partner Violence: Not At Risk (06/25/2023)    Humiliation, Afraid, Rape, and Kick questionnaire     Fear of Current or Ex-Partner: No     Emotionally  Abused: No     Physically Abused: No     Sexually Abused: No             Family History  Problem Relation Age of Onset   Diabetes Mother     Coronary artery disease Father      Hypertension Sister     Leukemia Brother            ROS: Otherwise negative unless mentioned in HPI   Physical Examination       Vitals:    06/26/23 0308 06/26/23 0742  BP: (!) 154/72 (!) 122/51  Pulse: (!) 54 63  Resp:   18  Temp: 97.6 F (36.4 C) 97.6 F (36.4 C)  SpO2: 100% 100%    Body mass index is 33.91 kg/m.   General:  WDWN in NAD Gait: Not observed HENT: WNL, normocephalic Pulmonary: normal non-labored breathing, without Rales, rhonchi,  wheezing Cardiac: regular, without  Murmurs, rubs or gallops; with carotid bruits Abdomen: Positive bowel Sounds,  soft, NT/ND, no masses Skin: without rashes Vascular Exam/Pulses: No pulses right lower  Extremities: with ischemic changes, without Gangrene , without cellulitis; without open wounds;  Musculoskeletal: no muscle wasting or atrophy       Neurologic: A&O X 3;  No focal weakness or paresthesias are detected; speech is fluent/normal Psychiatric:  The pt has Normal affect. Lymph:  Unremarkable   CBC Labs (Brief)          Component Value Date/Time    WBC 10.1 06/26/2023 0417    RBC 4.90 06/26/2023 0417    HGB 13.7 06/26/2023 0417    HGB 14.1 11/17/2013 0621    HCT 41.0 06/26/2023 0417    HCT 41.0 02/16/2022 0608    PLT 204 06/26/2023 0417    PLT 218 11/17/2013 0621    MCV 83.7 06/26/2023 0417    MCV 86 11/17/2013 0621    MCH 28.0 06/26/2023 0417    MCHC 33.4 06/26/2023 0417    RDW 15.4 06/26/2023 0417    RDW 13.6 11/17/2013 0621    LYMPHSABS 2.7 06/25/2023 1610    LYMPHSABS 2.4 11/17/2013 0621    MONOABS 0.6 06/25/2023 1610    MONOABS 0.9 11/17/2013 0621    EOSABS 0.0 06/25/2023 1610    EOSABS 0.1 11/17/2013 0621    BASOSABS 0.0 06/25/2023 1610    BASOSABS 0.0 11/17/2013 0621        BMET Labs (Brief)          Component Value Date/Time    NA 138 06/26/2023 0417    NA 135 (L) 11/17/2013 0621    K 4.1 06/26/2023 0417    K 4.3 11/17/2013 0621    CL 106 06/26/2023 0417    CL 99 11/17/2013 0621     CO2 25 06/26/2023 0417    CO2 31 11/17/2013 0621    GLUCOSE 182 (H) 06/26/2023 0417    GLUCOSE 172 (H) 11/17/2013 0621    BUN 13 06/26/2023 0417    BUN 10 11/17/2013 0621    CREATININE 0.78 06/26/2023 0417    CREATININE 1.05 04/18/2022 1034    CALCIUM 8.9 06/26/2023 0417    CALCIUM 9.6 11/17/2013 0621    GFRNONAA >60 06/26/2023 0417    GFRNONAA >60 11/17/2013 0621    GFRAA >60 01/10/2020 0808    GFRAA >60 11/17/2013 7846        COAGS: Recent Labs       Lab Results  Component Value Date  INR 1.2 06/25/2023    INR 1.3 (H) 02/15/2022    INR 1.2 03/31/2021          Non-Invasive Vascular Imaging:   None ordered   Statin:  Yes.   Beta Blocker:  No. Aspirin:  Yes.   ACEI:  No. ARB:  No. CCB use:  No Other antiplatelets/anticoagulants:  Yes.   Eliquis 5 mg twice daily     ASSESSMENT/PLAN: This is a 67 y.o. male who presents to [facility] with right lower extremity resting pain due to ischemia. Patient has long history  of vascular intervention with femoral to tibial bypass back in May.     Vascular Surgery plans on taking the patient to the lab for a right lower extremity angiogram with possible intervention. I discussed in detail with the patient the procedure, benefits, risks and complications. Patent wishes to proceed. Patient has been NPO since midnight.      -I discussed the plan in detail with Dr Vilinda Flake MD and he agrees with the plan.      Marcie Bal [facility] 06/26/2023 7:50 AM          Electronically signed by Marcie Bal, NP at 06/26/2023  9:29 AM

## 2023-06-26 NOTE — Progress Notes (Signed)
PROGRESS NOTE    Austin [name]  WUJ:[id number] DOB: 1956/08/26 DOA: 06/25/2023 PCP: Preston Fleeting, MD    Brief Narrative:  67 y.o. male with medical history significant of PVD (s/p of right common femoral to TP trunk bypass with vein on 02/25/2022), HTN, HLD,m DM, CAD, dCHF, A fib on Eliquis, s/p of left BKA, obesity, former smoker, who presents with right leg pain.    Pt states that his right leg pain started this afternoon, which is mainly located in the right lower leg, but also involving the whole right leg.  The pain is constant, throbbing, severe, nonradiating.  Not aggravated or alleviated by any known factors.  No injury. He state that his foot feels and lower leg are cool to touch. Patient does not have chest pain, cough or SOB. No nausea, vomiting, diarrhea or abdominal pain.  Denies symptoms of UTI.  No fever or chills. Pt has does not have palpable pulse in his right foot but does have good biphasic Doppler signal in ED.   9/19: Taken emergently to Cath Lab with vascular surgery.  Successful recannulation right lower extremity for limb salvage.  Patient tolerated procedure well.  Returned to medical floor.   Assessment & Plan:   Principal Problem:   Critical limb ischemia of right lower extremity (HCC) Active Problems:   Peripheral vascular disease (HCC)   Essential hypertension   Hyperlipidemia   Coronary artery disease   Type 2 diabetes mellitus with diabetic peripheral angiopathy without gangrene (HCC)   Atrial fibrillation, chronic (HCC)   DVT (deep venous thrombosis) (HCC)   Obesity (BMI 30-39.9)   Right leg pain  Critical limb ischemia of right lower extremity and hx of peripheral vascular disease: pt is s/p of right common femoral to TP trunk bypass with vein on 02/25/2022. Now has worsening right leg pain, clinically consistent with ischemia limb. EDP consulted [facility] NP, Sheppard Plumber who is with Dr. Gilda Crease of [facility]. -Status post successful recanalization of  right lower extremity for limb salvage Plan: Return patient to medical floor Okay for diet Multimodal pain control DC IV fluids Resume antiplatelet therapy Anticipate discharge 9/20  Essential hypertension Continue PTA Cozaar.  IV hydralazine as needed  Hyperlipidemia PTA Lipitor   Coronary artery disease PTA statin   Type 2 diabetes mellitus with diabetic peripheral angiopathy without gangrene [facility]): Recent A1c 11.3, poorly controlled.  Patient said taking Trulicity, Humalog, Lantus 41 unit daily -Glargine insulin 30 unit daily -Sliding scale insulin -Carb modified   Atrial fibrillation, chronic (HCC): Heart rate 83 Resume Eliquis on 9/20   DVT (deep venous thrombosis) (HCC) Resume Eliquis 920   Obesity (BMI 30-39.9): Body weight 113.4 kg, BMI 33.91   DVT prophylaxis: Heparin Code Status: Full Family Communication: None today Disposition Plan: Status is: Inpatient Remains inpatient appropriate because: Lower extremity peripheral arterial disease   Level of care: Telemetry Medical  Consultants:  Vascular surgery  Procedures:  Lower extremity angiography  Antimicrobials: None   Subjective: Seen and examined.  Resting in bed.  No visible distress.  Pain well-controlled.  Objective: Vitals:   06/26/23 1123 06/26/23 1137 06/26/23 1213 06/26/23 1339  BP: 116/74 132/82 132/75 120/81  Pulse: 69 61 66   Resp:  20 18 18   Temp:  98.2 F (36.8 C) 98 F (36.7 C)   TempSrc:  Oral    SpO2: 96% 97% 99% 99%  Weight:      Height:        Intake/Output Summary (Last 24  hours) at 06/26/2023 1524 Last data filed at 06/26/2023 1426 Gross per 24 hour  Intake 547.71 ml  Output 1600 ml  Net -1052.29 ml   Filed Weights   06/25/23 1601  Weight: 113.4 kg    Examination:  General exam: Appears calm and comfortable  Respiratory system: Clear to auscultation. Respiratory effort normal. Cardiovascular system: S1-S2 regular rate, irregular rhythm,, no murmurs, no  pedal edema Gastrointestinal system: Obese soft, NT/ND, bowel sounds Central nervous system: Alert and oriented. No focal neurological deficits. Extremities: Left lower extremity AKA Skin: No rashes, lesions or ulcers Psychiatry: Judgement and insight appear normal. Mood & affect appropriate.     Data Reviewed: I have personally reviewed following labs and imaging studies  CBC: Recent Labs  Lab 06/25/23 1610 06/26/23 0417  WBC 9.6 10.1  NEUTROABS 6.3  --   HGB 16.1 13.7  HCT 49.6 41.0  MCV 85.7 83.7  PLT 209 204   Basic Metabolic Panel: Recent Labs  Lab 06/25/23 1610 06/26/23 0417  NA 137 138  K 4.5 4.1  CL 106 106  CO2 22 25  GLUCOSE 132* 182*  BUN 14 13  CREATININE 0.88 0.78  CALCIUM 9.6 8.9   GFR: Estimated Creatinine Clearance: 116.5 mL/min (by C-G formula based on SCr of 0.78 mg/dL). Liver Function Tests: Recent Labs  Lab 06/25/23 1610  AST 26  ALT 20  ALKPHOS 87  BILITOT 1.2  PROT 8.1  ALBUMIN 4.2   No results for input(s): "LIPASE", "AMYLASE" in the last 168 hours. No results for input(s): "AMMONIA" in the last 168 hours. Coagulation Profile: Recent Labs  Lab 06/25/23 1812  INR 1.2   Cardiac Enzymes: No results for input(s): "CKTOTAL", "CKMB", "CKMBINDEX", "TROPONINI" in the last 168 hours. BNP (last 3 results) No results for input(s): "PROBNP" in the last 8760 hours. HbA1C: No results for input(s): "HGBA1C" in the last 72 hours. CBG: Recent Labs  Lab 06/25/23 1855 06/25/23 2242 06/26/23 0745 06/26/23 1041 06/26/23 1210  GLUCAP 99 197* 166* 158* 137*   Lipid Profile: No results for input(s): "CHOL", "HDL", "LDLCALC", "TRIG", "CHOLHDL", "LDLDIRECT" in the last 72 hours. Thyroid Function Tests: No results for input(s): "TSH", "T4TOTAL", "FREET4", "T3FREE", "THYROIDAB" in the last 72 hours. Anemia Panel: No results for input(s): "VITAMINB12", "FOLATE", "FERRITIN", "TIBC", "IRON", "RETICCTPCT" in the last 72 hours. Sepsis Labs: No  results for input(s): "PROCALCITON", "LATICACIDVEN" in the last 168 hours.  No results found for this or any previous visit (from the past 240 hour(s)).       Radiology Studies: PERIPHERAL VASCULAR CATHETERIZATION  Result Date: 06/26/2023 See surgical note for result.       Scheduled Meds:  acidophilus  1 capsule Oral QPC supper   apixaban  2.5 mg Oral BID   ascorbic acid  500 mg Oral BID WC   aspirin EC  325 mg Oral STAT   aspirin EC  81 mg Oral Daily   atorvastatin  80 mg Oral q1800   cholecalciferol  5,000 Units Oral Daily   clopidogrel  300 mg Oral STAT   [START ON 06/27/2023] clopidogrel  75 mg Oral Q breakfast   insulin aspart  0-5 Units Subcutaneous QHS   insulin aspart  0-9 Units Subcutaneous TID WC   insulin glargine-yfgn  30 Units Subcutaneous QHS   losartan  25 mg Oral Daily   nicotine  21 mg Transdermal Daily   sodium chloride flush  3 mL Intravenous Q12H   zinc sulfate  220 mg Oral QPC  supper   Continuous Infusions:  sodium chloride       LOS: 1 day      Tresa Moore, MD [facility]   If 7PM-7AM, please contact night-coverage  06/26/2023, 3:24 PM

## 2023-06-26 NOTE — Consult Note (Signed)
ANTICOAGULATION CONSULT NOTE   Pharmacy Consult for Heparin Infusion Indication:  limb ischemia  No Known Allergies  Patient Measurements: Height: 6' (182.9 cm) Weight: 113.4 kg (250 lb) IBW/kg (Calculated) : 77.6 Heparin Dosing Weight: 101.9 kg  Vital Signs: Temp: 97.8 F (36.6 C) (09/18 2206) BP: 144/93 (09/18 2206) Pulse Rate: 97 (09/18 2206)  Labs: Recent Labs    06/25/23 1610 06/25/23 1812 06/26/23 0106  HGB 16.1  --   --   HCT 49.6  --   --   PLT 209  --   --   APTT  --  34 105*  LABPROT  --  15.8*  --   INR  --  1.2  --   HEPARINUNFRC  --  >1.10*  --   CREATININE 0.88  --   --     Estimated Creatinine Clearance: 105.9 mL/min (by C-G formula based on SCr of 0.88 mg/dL).   Medical History: Past Medical History:  Diagnosis Date   Coronary artery disease    Diabetes (HCC)    DVT (deep venous thrombosis) (HCC)    Gangrene of left foot (HCC) 02/09/2018   GERD (gastroesophageal reflux disease)    Hyperlipidemia    Hypertension    Peripheral vascular disease (HCC)    Assessment: Austin [name] is a 67 y.o. male presenting with leg pain. PMH significant for HTN, HLD, T2DM, CAD, DVT, AF (on apixaban), PAD, and left BKA. Patient was on [facility] PTA per chart review. Last dose of apixaban was 9/18 at 0700. Patient does not have a palpable pulse in his right foot but does have good biphasic Doppler signal. Pharmacy has been consulted to initiate and manage heparin infusion.   Baseline Labs: aPTT 34, HL >1.10, PT 15.8, INR 1.2, Hgb 16.1, Hct 49.6, Plt 209   Goal of Therapy:  Heparin level 0.3-0.7 units/ml aPTT 66-102 seconds Monitor platelets by anticoagulation protocol: Yes  09/19 0106 aPTT 105   Plan:  No bolus as last dose of apixaban was <12h ago Decrease heparin infusion to 1500 units/hr Check aPTT in 6 hours after rate change Continue to monitor aPTT until HL and aPTT correlate.  Check HL daily for correlation until HL and aPTT correlate. Switch to HL  monitoring once HL and aPTT correlate.  Continue to monitor H&H and platelets daily while on heparin infusion    Otelia Sergeant, PharmD, [facility] 06/26/2023 2:07 AM

## 2023-06-26 NOTE — Plan of Care (Signed)
Problem: Tissue Perfusion: Goal: Adequacy of tissue perfusion will improve Outcome: Progressing

## 2023-06-26 NOTE — Consult Note (Signed)
Hospital Consult    Reason for Consult:  Right lower extremity Ischemia Requesting Physician:  Dr Lorretta Harp MRN #:  [id number]  History of Present Illness: This is a 67 y.o. male who presents to [facility] with right lower extremity ischemia. Resting pain started 3 days ago.  Well known to vascular surgery. Vascular Surgery consulted to evaluate.   Past Medical History:  Diagnosis Date   Coronary artery disease    Diabetes (HCC)    DVT (deep venous thrombosis) (HCC)    Gangrene of left foot (HCC) 02/09/2018   GERD (gastroesophageal reflux disease)    Hyperlipidemia    Hypertension    Peripheral vascular disease (HCC)     Past Surgical History:  Procedure Laterality Date   ABDOMINAL AORTOGRAM W/LOWER EXTREMITY N/A 02/20/2022   Procedure: ABDOMINAL AORTOGRAM W/LOWER EXTREMITY;  Surgeon: Cephus Shelling, MD;  [facility]: [facility];  Service: Cardiovascular;  Laterality: N/A;   AMPUTATION Left 10/28/2018   Procedure: AMPUTATION BELOW KNEE;  Surgeon: Annice Needy, MD;  Location: [facility];  Service: Vascular;  Laterality: Left;   AMPUTATION Left 09/10/2021   Procedure: AMPUTATION DIGIT;  Surgeon: Allena Napoleon, MD;  Location: [facility];  Service: Plastics;  Laterality: Left;   AMPUTATION Left 09/14/2021   Procedure: AMPUTATION DIGIT left long finger and irrigation and debridement;  Surgeon: Allena Napoleon, MD;  Location: [facility];  Service: Plastics;  Laterality: Left;   AMPUTATION Right 02/27/2022   Procedure: RIGHT PARTIAL AMPUTATION FOOT;  Surgeon: Felecia Shelling, DPM;  Location: [facility];  Service: Podiatry;  Laterality: Right;   AMPUTATION TOE Left 02/10/2018   Procedure: AMPUTATION TOE;  Surgeon: Linus Galas, DPM;  Location: [facility];  Service: Podiatry;  Laterality: Left;   APPLICATION OF WOUND VAC Left 10/16/2018   Procedure: APPLICATION OF WOUND VAC;  Surgeon: Gwyneth Revels, DPM;  Location: [facility];  Service: Podiatry;  Laterality: Left;   DORSAL SLIT N/A 01/12/2021   Procedure:  DORSAL SLIT;  Surgeon: Sondra Come, MD;  Location: [facility];  Service: Urology;  Laterality: N/A;   FEMORAL-TIBIAL BYPASS GRAFT Right 02/25/2022   Procedure: RIGHT LOWER EXTREMITY FEMORAL TO TIBIAL PERONEAL TRUNK BYPASS;  Surgeon: Cephus Shelling, MD;  Location: [facility];  Service: Vascular;  Laterality: Right;  INSERT ARTERIAL LINE   groin surgery     IRRIGATION AND DEBRIDEMENT FOOT Left 09/30/2018   Procedure: IRRIGATION AND DEBRIDEMENT FOOT;  Surgeon: Linus Galas, DPM;  Location: [facility];  Service: Podiatry;  Laterality: Left;   LOWER EXTREMITY ANGIOGRAPHY Left 02/12/2018   Procedure: Lower Extremity Angiography;  Surgeon: Annice Needy, MD;  Location: [facility];  Service: Cardiovascular;  Laterality: Left;   LOWER EXTREMITY ANGIOGRAPHY Left 10/01/2018   Procedure: Lower Extremity Angiography;  Surgeon: Annice Needy, MD;  Location: [facility];  Service: Cardiovascular;  Laterality: Left;   LOWER EXTREMITY ANGIOGRAPHY Left 10/19/2018   Procedure: Lower Extremity Angiography;  Surgeon: Annice Needy, MD;  Location: [facility];  Service: Cardiovascular;  Laterality: Left;   LOWER EXTREMITY ANGIOGRAPHY Left 10/20/2018   Procedure: LOWER EXTREMITY ANGIOGRAPHY;  Surgeon: Annice Needy, MD;  Location: [facility];  Service: Cardiovascular;  Laterality: Left;   LOWER EXTREMITY ANGIOGRAPHY Right 11/25/2018   Procedure: LOWER EXTREMITY ANGIOGRAPHY;  Surgeon: Annice Needy, MD;  Location: [facility];  Service: Cardiovascular;  Laterality: Right;   LOWER EXTREMITY ANGIOGRAPHY Right 01/10/2020   Procedure: LOWER EXTREMITY  ANGIOGRAPHY;  Surgeon: Annice Needy, MD;  Location: [facility];  Service: Cardiovascular;  Laterality: Right;   LOWER EXTREMITY ANGIOGRAPHY Right 12/11/2020   Procedure: LOWER EXTREMITY ANGIOGRAPHY;  Surgeon: Annice Needy, MD;  Location: [facility];  Service: Cardiovascular;  Laterality: Right;   LOWER EXTREMITY INTERVENTION   02/12/2018   Procedure: LOWER EXTREMITY INTERVENTION;  Surgeon: Annice Needy, MD;  Location: [facility];  Service: Cardiovascular;;   MINOR IRRIGATION AND DEBRIDEMENT OF WOUND Left 09/10/2021   Procedure: IRRIGATION AND DEBRIDEMENT OF LEFT LONG FINGER WOUND;  Surgeon: Allena Napoleon, MD;  Location: [facility];  Service: Plastics;  Laterality: Left;   NECK SURGERY     PERIPHERAL VASCULAR INTERVENTION  02/20/2022   Procedure: PERIPHERAL VASCULAR INTERVENTION;  Surgeon: Cephus Shelling, MD;  Location: [facility];  Service: Cardiovascular;;  right iliac   TRANSMETATARSAL AMPUTATION Left 10/16/2018   Procedure: TRANSMETATARSAL AMPUTATION LEFT FOOT;  Surgeon: Gwyneth Revels, DPM;  Location: [facility];  Service: Podiatry;  Laterality: Left;   VEIN HARVEST Right 02/25/2022   Procedure: VEIN HARVEST OF RIGHT GREATER SAPHENOUS VEIN;  Surgeon: Cephus Shelling, MD;  Location: [facility];  Service: Vascular;  Laterality: Right;    No Known Allergies  Prior to Admission medications   Medication Sig Start Date End Date Taking? Authorizing Provider  acidophilus (RISAQUAD) CAPS capsule Take 1 capsule by mouth daily after supper. 03/21/22  Yes Love, Evlyn Kanner, PA-C  apixaban (ELIQUIS) 5 MG TABS tablet Take 1 tablet (5 mg total) by mouth 2 (two) times daily. 03/21/22  Yes Love, Evlyn Kanner, PA-C  ascorbic acid (VITAMIN C) 500 MG tablet Take 1 tablet (500 mg total) by mouth 2 (two) times daily. 03/21/22  Yes Love, Evlyn Kanner, PA-C  aspirin EC 81 MG tablet Take 1 tablet (81 mg total) by mouth daily. 01/10/20  Yes Dew, Marlow Baars, MD  atorvastatin (LIPITOR) 80 MG tablet Take 1 tablet (80 mg total) by mouth daily at 6 PM. 03/21/22  Yes Love, Evlyn Kanner, PA-C  Cholecalciferol (VITAMIN D3) 125 MCG (5000 UT) CAPS Take 1 capsule by mouth daily. 04/17/22  Yes [provider]  insulin glargine (LANTUS) 100 UNIT/ML Solostar Pen Inject 41 Units into the skin 2 (two) times daily. Patient taking differently: Inject 40  Units into the skin at bedtime. 03/21/22  Yes Love, Evlyn Kanner, PA-C  insulin lispro (HUMALOG) 100 UNIT/ML injection Inject 13 Units into the skin 3 (three) times daily before meals.   Yes [provider]  Insulin Lispro w/ Trans Port 100 UNIT/ML SOPN 24 Units 3 (three) times daily. 12/08/19  Yes [provider]  losartan (COZAAR) 25 MG tablet Take 1 tablet (25 mg total) by mouth daily. 03/21/22  Yes Love, Evlyn Kanner, PA-C  magnesium oxide (MAG-OX) 400 MG tablet Take 0.5 tablets (200 mg total) by mouth daily. 03/21/22  Yes Love, Evlyn Kanner, PA-C  methocarbamol (ROBAXIN) 500 MG tablet Take 1 tablet (500 mg total) by mouth every 6 (six) hours as needed for muscle spasms. 03/21/22  Yes Love, Evlyn Kanner, PA-C  NICORETTE STARTER KIT 4 MG gum Take 4 mg by mouth as needed for smoking cessation. 02/05/23  Yes [provider]  polyethylene glycol powder (GLYCOLAX/MIRALAX) 17 GM/SCOOP powder Take 34 g by mouth daily. 01/16/23  Yes Brimage, Vondra, DO  TRULICITY 0.75 MG/0.5ML SOPN Inject into the skin. 03/22/22  Yes [provider]  Zinc Sulfate 220 (50 Zn) MG TABS  Take 1 tablet (220 mg total) by mouth daily after supper. 03/21/22  Yes Love, Evlyn Kanner, PA-C  clopidogrel (PLAVIX) 75 MG tablet Take 1 tablet (75 mg total) by mouth daily at 6 (six) AM. Patient not taking: Reported on 06/25/2022 03/21/22   Love, Evlyn Kanner, PA-C  leptospermum manuka honey (MEDIHONEY) PSTE paste Apply 1 Application topically daily. Patient not taking: Reported on 06/25/2022 03/21/22   Love, Evlyn Kanner, PA-C  oxyCODONE (OXY IR/ROXICODONE) 5 MG immediate release tablet Take 1 tablet (5 mg total) by mouth 2 (two) times daily as needed for severe pain. Patient not taking: Reported on 06/25/2022 03/21/22   Love, Evlyn Kanner, PA-C  pantoprazole (PROTONIX) 40 MG tablet Take 1 tablet (40 mg total) by mouth daily. Patient not taking: Reported on 06/25/2022 03/21/22   Love, Evlyn Kanner, PA-C  Vitamin D, Ergocalciferol, (DRISDOL) 1.25 MG (50000  UNIT) CAPS capsule Take 1 capsule (50,000 Units total) by mouth every 7 (seven) days. Patient not taking: Reported on 06/25/2022 03/23/22   Love, Evlyn Kanner, PA-C    Social History   Socioeconomic History   Marital status: Divorced    Spouse name: Not on file   Number of children: Not on file   Years of education: Not on file   Highest education level: Not on file  Occupational History   Not on file  Tobacco Use   Smoking status: Former    Current packs/day: 0.00    Average packs/day: 1 pack/day for 47.0 years (47.0 ttl pk-yrs)    Types: Cigarettes    Start date: 01/1974    Quit date: 01/2021    Years since quitting: 2.4   Smokeless tobacco: Never  Vaping Use   Vaping status: Never Used  Substance and Sexual Activity   Alcohol use: No   Drug use: No   Sexual activity: Not Currently  Other Topics Concern   Not on file  Social History Narrative   Not on file   Social Determinants of Health   Financial Resource Strain: Not on file  Food Insecurity: No Food Insecurity (06/25/2023)   Hunger Vital Sign    Worried About Running Out of Food in the Last Year: Never true    Ran Out of Food in the Last Year: Never true  Transportation Needs: No Transportation Needs (06/25/2023)   PRAPARE - Administrator, Civil Service (Medical): No    Lack of Transportation (Non-Medical): No  Physical Activity: Not on file  Stress: Not on file  Social Connections: Not on file  Intimate Partner Violence: Not At Risk (06/25/2023)   Humiliation, Afraid, Rape, and Kick questionnaire    Fear of Current or Ex-Partner: No    Emotionally Abused: No    Physically Abused: No    Sexually Abused: No     Family History  Problem Relation Age of Onset   Diabetes Mother    Coronary artery disease Father    Hypertension Sister    Leukemia Brother     ROS: Otherwise negative unless mentioned in HPI  Physical Examination  Vitals:   06/26/23 0308 06/26/23 0742  BP: (!) 154/72 (!) 122/51   Pulse: (!) 54 63  Resp:  18  Temp: 97.6 F (36.4 C) 97.6 F (36.4 C)  SpO2: 100% 100%   Body mass index is 33.91 kg/m.  General:  WDWN in NAD Gait: Not observed HENT: WNL, normocephalic Pulmonary: normal non-labored breathing, without Rales, rhonchi,  wheezing Cardiac: regular, without  Murmurs, rubs or  gallops; with carotid bruits Abdomen: Positive bowel Sounds,  soft, NT/ND, no masses Skin: without rashes Vascular Exam/Pulses: No pulses right lower  Extremities: with ischemic changes, without Gangrene , without cellulitis; without open wounds;  Musculoskeletal: no muscle wasting or atrophy  Neurologic: A&O X 3;  No focal weakness or paresthesias are detected; speech is fluent/normal Psychiatric:  The pt has Normal affect. Lymph:  Unremarkable  CBC    Component Value Date/Time   WBC 10.1 06/26/2023 0417   RBC 4.90 06/26/2023 0417   HGB 13.7 06/26/2023 0417   HGB 14.1 11/17/2013 0621   HCT 41.0 06/26/2023 0417   HCT 41.0 02/16/2022 0608   PLT 204 06/26/2023 0417   PLT 218 11/17/2013 0621   MCV 83.7 06/26/2023 0417   MCV 86 11/17/2013 0621   MCH 28.0 06/26/2023 0417   MCHC 33.4 06/26/2023 0417   RDW 15.4 06/26/2023 0417   RDW 13.6 11/17/2013 0621   LYMPHSABS 2.7 06/25/2023 1610   LYMPHSABS 2.4 11/17/2013 0621   MONOABS 0.6 06/25/2023 1610   MONOABS 0.9 11/17/2013 0621   EOSABS 0.0 06/25/2023 1610   EOSABS 0.1 11/17/2013 0621   BASOSABS 0.0 06/25/2023 1610   BASOSABS 0.0 11/17/2013 0621    BMET    Component Value Date/Time   NA 138 06/26/2023 0417   NA 135 (L) 11/17/2013 0621   K 4.1 06/26/2023 0417   K 4.3 11/17/2013 0621   CL 106 06/26/2023 0417   CL 99 11/17/2013 0621   CO2 25 06/26/2023 0417   CO2 31 11/17/2013 0621   GLUCOSE 182 (H) 06/26/2023 0417   GLUCOSE 172 (H) 11/17/2013 0621   BUN 13 06/26/2023 0417   BUN 10 11/17/2013 0621   CREATININE 0.78 06/26/2023 0417   CREATININE 1.05 04/18/2022 1034   CALCIUM 8.9 06/26/2023 0417   CALCIUM 9.6  11/17/2013 0621   GFRNONAA >60 06/26/2023 0417   GFRNONAA >60 11/17/2013 0621   GFRAA >60 01/10/2020 0808   GFRAA >60 11/17/2013 0621    COAGS: Lab Results  Component Value Date   INR 1.2 06/25/2023   INR 1.3 (H) 02/15/2022   INR 1.2 03/31/2021     Non-Invasive Vascular Imaging:   None ordered  Statin:  Yes.   Beta Blocker:  No. Aspirin:  Yes.   ACEI:  No. ARB:  No. CCB use:  No Other antiplatelets/anticoagulants:  Yes.   Eliquis 5 mg twice daily   ASSESSMENT/PLAN: This is a 67 y.o. male who presents to [facility] with right lower extremity resting pain due to ischemia. Patient has long history  of vascular intervention with femoral to tibial bypass back in May.    Vascular Surgery plans on taking the patient to the lab for a right lower extremity angiogram with possible intervention. I discussed in detail with the patient the procedure, benefits, risks and complications. Patent wishes to proceed. Patient has been NPO since midnight.    -I discussed the plan in detail with Dr Vilinda Flake MD and he agrees with the plan.    [facility]ie [facility] 06/26/2023 7:50 AM

## 2023-06-26 NOTE — Op Note (Signed)
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 Complication vascular device with thrombosis occlusion of the multiple previous vascular interventions and surgeries  Post-operative diagnosis:  Same  Procedure(s) Performed:             1.  Introduction catheter into right lower extremity 3rd order catheter placement               2.  Contrast injection right lower extremity for distal runoff with additional 3rd order             3.  Percutaneous transluminal angioplasty midportion of the femoral tibial bypass graft                          4.  Percutaneous transluminal angioplasty tibioperoneal trunk and peroneal             5.  Star close closure left common femoral arteriotomy  Anesthesia: Conscious sedation was administered under my direct supervision by the interventional radiology RN. IV Versed plus fentanyl were utilized. Continuous ECG, pulse oximetry and blood pressure was monitored throughout the entire procedure.  Conscious sedation was for a total of 52 minutes.  Sheath: 5 [place] Ansell left common femoral retrograde  Contrast: 55 cc  Fluoroscopy Time: 6.5 minutes  Indications:  Austin [name] presents with increasing pain of the right lower extremity.  He has had multiple interventions in the past as well as multiple surgeries.  This suggests the patient is having limb threatening ischemia. The risks and benefits are reviewed all questions answered patient agrees to proceed.  Procedure: Austin [name] is a 67 y.o. y.o. male who was identified and appropriate procedural time out was performed.  The patient was then placed supine on the table and prepped and draped in the usual sterile fashion.    Ultrasound was placed in the sterile sleeve and the left  groin was evaluated the left common femoral artery was echolucent and pulsatile indicating patency.  Image was recorded for the permanent record and under real-time visualization a microneedle was inserted into the common femoral artery followed by the microwire and then the micro-sheath.  A J-wire was then advanced through the micro-sheath and a  5 [place] sheath was then inserted over a J-wire. J-wire was then advanced and a 5 French pigtail catheter was positioned at the level of T12.  AP projection of the aorta was then obtained. Pigtail catheter was repositioned to above the bifurcation and a LAO view of the pelvis was obtained.  Subsequently a pigtail catheter with the stiff angle Glidewire was used to cross the aortic bifurcation the catheter wire were advanced down into the right distal external iliac artery. Oblique view of the femoral bifurcation was then obtained and subsequently the wire was reintroduced and the pigtail catheter negotiated into the SFA representing third order catheter placement. Distal runoff was then performed.  6000 units of heparin was then given and allowed to circulate and a 5 [place] Ansell sheath was advanced up and over the bifurcation and positioned in the mid common femoral artery  A Kumpe catheter with a 0.018 advantage wire were then negotiated down into the distal bypass at the level of the below-knee popliteal. Catheter was then advanced. Hand injection contrast demonstrated the tibial anatomy in detail.  A  0.014 advantage wire was then advanced through the catheter and a 2.5 mm x 40 mm ultra score balloon was advanced across the lesion in the mid bypass graft inflation was to 8 atm for 1 minute.  The balloon was then deflated and negotiated down into the tibioperoneal trunk and peroneal across the distal anastomosis and inflated to 10 atm for 1 full minute.  Follow-up imaging by hand-injection through the sheath now showed the bypass and the outflow tibioperoneal trunk  are widely patent with less than 10% residual stenosis.  There is preservation of the distal runoff.  After review of these images the sheath is pulled into the left external iliac oblique of the common femoral is obtained and a Star close device deployed. There no immediate Complications.  Findings:  The abdominal aorta is opacified with a bolus injection contrast. Renal arteries are patent without evidence of hemodynamically significant stenosis. The aorta itself has diffuse disease but no hemodynamically significant lesions. The common and external iliac arteries are widely patent bilaterally.  The right common femoral is widely patent as is the profunda femoris.  The SFA and popliteal arteries are occluded with the previously noted stents are present.  Previous femoral to tibioperoneal trunk bypass is patent.  It is quite small.  In its distal one third there is an isolated 15 mm length greater than 90% stenosis.  Distally at the level just beyond the anastomosis the tibioperoneal trunk and proximal peroneal demonstrate a greater than 80% stenosis.  Distal to this the peroneal is patent down to the foot.  Anterior tibial is patent and is reconstituted by collaterals.    Following angioplasty the mid graft is now widely patent with less than 10% residual stenosis.  The distal tibioperoneal trunk and peroneal are patent as well with less than 10% residual stenosis.  Distal runoff is preserved.    Summary: Successful recanalization right lower extremity for limb salvage                           Disposition: Patient was taken to the recovery room in stable condition having tolerated the procedure well.  [name] 06/26/2023,11:46 AM

## 2023-06-26 NOTE — Interval H&P Note (Signed)
History and Physical Interval Note:  06/26/2023 9:33 AM  Austin [name]  has presented today for surgery, with the diagnosis of PAD.  The various methods of treatment have been discussed with the patient and family. After consideration of risks, benefits and other options for treatment, the patient has consented to  Procedure(s): Lower Extremity Angiography (Right) as a surgical intervention.  [name] history has been reviewed, patient examined, no change in status, stable for surgery.  I have reviewed the patient's chart and labs.  Questions were answered to the patient's satisfaction.     [name]

## 2023-06-27 ENCOUNTER — Encounter: Payer: Self-pay | Admitting: Vascular Surgery

## 2023-06-27 DIAGNOSIS — I70221 Atherosclerosis of native arteries of extremities with rest pain, right leg: Secondary | ICD-10-CM | POA: Diagnosis not present

## 2023-06-27 LAB — GLUCOSE, CAPILLARY
Glucose-Capillary: 153 mg/dL — ABNORMAL HIGH (ref 70–99)
Glucose-Capillary: 190 mg/dL — ABNORMAL HIGH (ref 70–99)
Glucose-Capillary: 153 mg/dL — ABNORMAL HIGH (ref 70–99)
Glucose-Capillary: 158 mg/dL — ABNORMAL HIGH (ref 70–99)

## 2023-06-27 MED ORDER — GABAPENTIN 300 MG PO CAPS
300.0000 mg | ORAL_CAPSULE | Freq: Two times a day (BID) | ORAL | Status: DC
  Administered 2023-06-27 – 2023-06-28 (×3): 300 mg via ORAL
  Filled 2023-06-27 (×3): qty 1

## 2023-06-27 MED ORDER — METHOCARBAMOL 500 MG PO TABS
500.0000 mg | ORAL_TABLET | Freq: Four times a day (QID) | ORAL | Status: DC
  Administered 2023-06-27 – 2023-07-01 (×18): 500 mg via ORAL
  Filled 2023-06-27 (×16): qty 1

## 2023-06-27 MED ORDER — HYDROMORPHONE HCL 1 MG/ML IJ SOLN
0.5000 mg | INTRAMUSCULAR | Status: DC | PRN
  Administered 2023-06-28 – 2023-06-29 (×3): 0.5 mg via INTRAVENOUS
  Filled 2023-06-27 (×3): qty 0.5

## 2023-06-27 NOTE — Evaluation (Signed)
Occupational Therapy Evaluation Patient Details Name: Austin [name] MRN: [id number] DOB: 11/08/1955 Today's Date: 06/27/2023   History of Present Illness Austin [name] is a 67 y.o. male with medical history significant of PVD (s/p of right common femoral to TP trunk bypass with vein on 02/25/2022), HTN, HLD,m DM, CAD, dCHF, A fib on Eliquis, s/p of left BKA, obesity, former smoker, who presents with right leg pain. Pt underwent  successful recannulation right lower extremity for limb salvage.   Clinical Impression   Mr. Austin [name] was seen for OT evaluation this date. Prior to hospital admission, pt was MOD I for ADL management using a RW for functional transfers in his home and a [facility] for longer distances. Pt lives in a mobile home with ~4 STE and a portable ramp over the steps. Pt reports ramp and home are about to undergo renovations to maximize accessibility, but at this time are in need of some reapiar. Pt presents to acute OT demonstrating impaired ADL performance and functional mobility 2/2 generalized weakness, decreased balance, and decreased functional use of his LLE (See OT problem list for additional functional deficits). Pt currently requires MAX A +2 for STS t/f attempts with a RW. Anticipate MOD A for LB ADL management using seated lateral leans.  Pt would benefit from skilled OT services to address noted impairments and functional limitations (see below for any additional details) in order to maximize safety and independence while minimizing falls risk and caregiver burden. Anticipate the need for follow up therapy services upon acute hospital DC (< 3 hours/day).        If plan is discharge home, recommend the following: Two people to help with walking and/or transfers;A lot of help with bathing/dressing/bathroom;Assistance with cooking/housework;Help with stairs or ramp for entrance;Assist for transportation    Functional Status Assessment  Patient has had a recent decline in  their functional status and demonstrates the ability to make significant improvements in function in a reasonable and predictable amount of time.  Equipment Recommendations  BSC/3in1    Recommendations for Other Services       Precautions / Restrictions Precautions Precautions: Fall Restrictions Weight Bearing Restrictions: No Other Position/Activity Restrictions: Hx, L BKA has prosthetic in room.      Mobility Bed Mobility               General bed mobility comments: NT, pt in recliner at start/end of session.    Transfers Overall transfer level: Needs assistance Equipment used: Rolling walker (2 wheels), 2 person hand held assist Transfers: Sit to/from Stand Sit to Stand: +2 safety/equipment, +2 physical assistance, Max assist, Total assist           General transfer comment: Unable to come to full stand despite MAX A +2 when attempting to stand from recliner.      Balance Overall balance assessment: Needs assistance Sitting-balance support: Feet supported, No upper extremity supported Sitting balance-Leahy Scale: Good Sitting balance - Comments: steady with dynamic sitting balance while seated in recliner. Able to reach outside BOS.   Standing balance support: Reliant on assistive device for balance, During functional activity, Bilateral upper extremity supported Standing balance-Leahy Scale: Zero                             ADL either performed or assessed with clinical judgement   ADL Overall ADL's : Needs assistance/impaired  General ADL Comments: Pt is significantly functionally limited by generalized weakness and decreased functional use of his RLE. He requires MAX-TOTAL A +2 to attempt STS t/f with RW, however pt is unable to come to full stand. He is unable to straighten his RLE or do much weight bearing. LLE prosthetic donned t/o session. Pt independently dons LLE prosthetic while  seated, MOD I for seated UB ADL management.     Vision Patient Visual Report: No change from baseline       Perception         Praxis         Pertinent Vitals/Pain Pain Assessment Pain Assessment: No/denies pain     Extremity/Trunk Assessment Upper Extremity Assessment Upper Extremity Assessment: Generalized weakness   Lower Extremity Assessment Lower Extremity Assessment: Generalized weakness       Communication Communication Communication: No apparent difficulties Cueing Techniques: Verbal cues   Cognition Arousal: Alert Behavior During Therapy: WFL for tasks assessed/performed Overall Cognitive Status: Within Functional Limits for tasks assessed                                 General Comments: Pt is pleasant, cooperative, eager to particiapte in therapy session.     General Comments       Exercises Other Exercises Other Exercises: Pt educated on role of OT in acute setting, safety, falls prevention strategies, safe use of AE/DME for ADL management and DC recs.   Shoulder Instructions      Home Living Family/patient expects to be discharged to:: Private residence Living Arrangements: Alone Available Help at Discharge: Family;Available 24 hours/day Type of Home: Mobile home Home Access: Stairs to enter;Ramped entrance (Has an older ramp over his stairs, it is need of some repairs.) Entrance Stairs-Number of Steps: ~5   Home Layout: One level     Bathroom Shower/Tub: Walk-in shower         Home Equipment: Pharmacist, hospital (2 wheels);Wheelchair - manual   Additional Comments: Pt reports his home is being remodeled in Oct of 2024 to make it more accessible.      Prior Functioning/Environment Prior Level of Function : Independent/Modified Independent             Mobility Comments: Uses RW for functional transfers in the home. Uses WC for longer distance. ADLs Comments: Cooks, uses shower chare to bathe, generally  independent.        OT Problem List: Decreased strength;Decreased range of motion;Decreased safety awareness;Impaired balance (sitting and/or standing);Decreased knowledge of precautions;Cardiopulmonary status limiting activity;Decreased activity tolerance      OT Treatment/Interventions: Self-care/ADL training;Therapeutic exercise;DME and/or AE instruction;Balance training;Energy conservation;Patient/family education    OT Goals(Current goals can be found in the care plan section) Acute Rehab OT Goals Patient Stated Goal: To be able to walk again. OT Goal Formulation: With patient Time For Goal Achievement: 07/11/23 Potential to Achieve Goals: Good ADL Goals Pt Will Perform Grooming: with set-up;with supervision;standing Pt Will Perform Lower Body Dressing: sit to/from stand;with min assist;with adaptive equipment Pt Will Transfer to Toilet: bedside commode;with supervision;with set-up;with transfer board;stand pivot transfer  OT Frequency: Min 1X/week    Co-evaluation              AM-PAC OT "6 Clicks" Daily Activity     Outcome Measure Help from another person eating meals?: A Little Help from another person taking care of personal grooming?: A Little Help from another person toileting,  which includes using toliet, bedpan, or urinal?: A Lot Help from another person bathing (including washing, rinsing, drying)?: A Lot Help from another person to put on and taking off regular upper body clothing?: A Little Help from another person to put on and taking off regular lower body clothing?: A Lot 6 Click Score: 15   End of Session Equipment Utilized During Treatment: Gait belt;Rolling walker (2 wheels)  Activity Tolerance: Patient tolerated treatment well Patient left: in chair;with call bell/phone within reach;with chair alarm set  OT Visit Diagnosis: Other abnormalities of gait and mobility (R26.89);Muscle weakness (generalized) (M62.81)                Time: 2725-3664 OT  Time Calculation (min): 20 min Charges:  OT General Charges $OT Visit: 1 Visit OT Evaluation $OT Eval Moderate Complexity: 1 Mod OT Treatments $Self Care/Home Management : 8-22 mins  Rockney Ghee, M.S., OTR/L 06/27/23, 3:01 PM

## 2023-06-27 NOTE — Plan of Care (Signed)

## 2023-06-27 NOTE — Progress Notes (Signed)
   06/27/23 0345  Provider Notification  Provider Name/Title [facility]  Date Provider Notified 06/27/23  Time Provider Notified 0340  Method of Notification  (secure chat)  Notification Reason Other (Comment) (update on minimal efects of prn meds)  Provider response Other (Comment) (continue with current pain regimen)  Date of Provider Response 06/27/23  Time of Provider Response [phone]

## 2023-06-27 NOTE — Evaluation (Signed)
Physical Therapy Evaluation Patient Details Name: Austin [name] MRN: [id number] DOB: [birth year] Today's Date: 06/27/2023  History of Present Illness  Lewis Stuller is a 67 y.o. male with medical history significant of PVD (s/p of right common femoral to TP trunk bypass with vein on 02/25/2022), HTN, HLD,m DM, CAD, dCHF, A fib on Eliquis, s/p of left BKA, obesity, former smoker, who presents with right leg pain. Pt underwent  successful recannulation right lower extremity for limb salvage.   Clinical Impression  Pt admitted with above diagnosis. Pt currently with functional limitations due to the deficits listed below (see PT Problem List). Pt received upright in recliner agreeable to PT eval. PT reports PTA he was mod-I with L prosthetic and RW completing short household distances but reliant on w/c for outside of home distances.   To date, pt needing set up assis to don silicon liner but independent for donning prosthetic. Pt is supervision for lateral scoot transfers to <> from recliner and bed with increased time mainly reliant on BUE's to elevate buttocks from surfaces. With sitting EOB, and bed significantly elevated, maxA+2 needed for STS efforts x2 with RW with limited knee and hip extension and notable tremors in LE's due to weakness. Deferred stand pivot transfer due to difficulty in standing and return to recliner via lateral scoot. Pt with all needs in reach. Reviewed L BKA mobility and positioning and RLE quad strengthening and stretching as pt is markedly weak 2/5 MMT in R knee extension. Pt verbalized understanding. Pt will benefit from skilled PT to increase their independence and safety with mobility to allow discharge to the next venue of care.          If plan is discharge home, recommend the following: A lot of help with walking and/or transfers;A lot of help with bathing/dressing/bathroom;Assist for transportation;Help with stairs or ramp for entrance;Assistance with  cooking/housework   Can travel by private vehicle   No    Equipment Recommendations Other (comment) (TBD by next venue of care)  Recommendations for Other Services       Functional Status Assessment Patient has had a recent decline in their functional status and demonstrates the ability to make significant improvements in function in a reasonable and predictable amount of time.     Precautions / Restrictions Precautions Precautions: Fall Restrictions Weight Bearing Restrictions: No Other Position/Activity Restrictions: Hx, L BKA has prosthetic in room.      Mobility  Bed Mobility               General bed mobility comments: NT, pt in recliner at start/end of session. Patient Response: Cooperative  Transfers Overall transfer level: Needs assistance Equipment used: Rolling walker (2 wheels), 2 person hand held assist Transfers: Sit to/from Stand, Bed to chair/wheelchair/BSC Sit to Stand: Max assist, From elevated surface          Lateral/Scoot Transfers: Supervision General transfer comment: increased time to scoot transfer from chair to EOB. x2 STS efforts. Then scoot transfer back to reclliner.    Ambulation/Gait                  Stairs            Wheelchair Mobility     Tilt Bed Tilt Bed Patient Response: Cooperative  Modified Rankin (Stroke Patients Only)       Balance Overall balance assessment: Needs assistance Sitting-balance support: Bilateral upper extremity supported, No upper extremity supported Sitting balance-Leahy Scale: Normal     Standing  balance support: Bilateral upper extremity supported, During functional activity, Reliant on assistive device for balance Standing balance-Leahy Scale: Poor Standing balance comment: limited hip and knee extension in standing. Significant UE support needed on RW to stabilize himself.                             Pertinent Vitals/Pain Pain Assessment Pain Assessment:  Faces Faces Pain Scale: Hurts little more Pain Location: RLE Pain Descriptors / Indicators: Aching, Cramping, Discomfort, Grimacing Pain Intervention(s): Limited activity within patient's tolerance, Monitored during session, Patient requesting pain meds-RN notified    Home Living Family/patient expects to be discharged to:: Private residence Living Arrangements: Alone Available Help at Discharge: Family;Available 24 hours/day Type of Home: Mobile home Home Access: Ramped entrance;Stairs to enter (ramp placed over his stairs)   Entrance Stairs-Number of Steps: ~5   Home Layout: One level Home Equipment: Pharmacist, hospital (2 wheels);Wheelchair - manual Additional Comments: Pt reports his home is being remodeled in Oct of 2024 to make it more accessible.    Prior Function Prior Level of Function : Independent/Modified Independent             Mobility Comments: Uses RW for functional transfers in the home. Uses WC for longer distance. ADLs Comments: Cooks, uses shower chair to bathe, generally independent.     Extremity/Trunk Assessment   Upper Extremity Assessment Upper Extremity Assessment: Generalized weakness    Lower Extremity Assessment Lower Extremity Assessment: Generalized weakness       Communication   Communication Communication: No apparent difficulties Cueing Techniques: Verbal cues  Cognition Arousal: Alert Behavior During Therapy: WFL for tasks assessed/performed Overall Cognitive Status: Within Functional Limits for tasks assessed                                          General Comments      Exercises General Exercises - Lower Extremity Quad Sets: AROM, Right, 10 reps, Seated Short Arc Quad: AAROM, Strengthening, Right, 10 reps Long Arc Quad: AAROM, Strengthening, Right, 5 reps, Seated Other Exercises Other Exercises: Reviewed L BKA positioning. Discussed R quad exercises. D/c recs.   Assessment/Plan    PT  Assessment Patient needs continued PT services  PT Problem List Decreased strength;Pain;Decreased activity tolerance;Decreased balance;Decreased safety awareness;Decreased mobility;Decreased knowledge of precautions       PT Treatment Interventions DME instruction;Balance training;Gait training;Neuromuscular re-education;Functional mobility training;Patient/family education;Therapeutic activities;Therapeutic exercise    PT Goals (Current goals can be found in the Care Plan section)  Acute Rehab PT Goals Patient Stated Goal: improve RLE pain and return to walking PT Goal Formulation: With patient Time For Goal Achievement: 07/11/23 Potential to Achieve Goals: Fair    Frequency Min 1X/week     Co-evaluation               AM-PAC PT "6 Clicks" Mobility  Outcome Measure Help needed turning from your back to your side while in a flat bed without using bedrails?: A Little Help needed moving from lying on your back to sitting on the side of a flat bed without using bedrails?: A Little Help needed moving to and from a bed to a chair (including a wheelchair)?: A Lot Help needed standing up from a chair using your arms (e.g., wheelchair or bedside chair)?: Total Help needed to walk in hospital room?: Total Help needed  climbing 3-5 steps with a railing? : Total 6 Click Score: 11    End of Session Equipment Utilized During Treatment: Gait belt Activity Tolerance: Patient tolerated treatment well;Patient limited by pain;Patient limited by fatigue Patient left: in chair;with call bell/phone within reach;with chair alarm set;with nursing/sitter in room Nurse Communication: Mobility status PT Visit Diagnosis: Other abnormalities of gait and mobility (R26.89);Unsteadiness on feet (R26.81);Pain;Muscle weakness (generalized) (M62.81) Pain - Right/Left: Right Pain - part of body: Leg    Time: 5409-8119 PT Time Calculation (min) (ACUTE ONLY): 23 min   Charges:   PT Evaluation $PT Eval  Moderate Complexity: 1 Mod PT Treatments $Therapeutic Activity: 8-22 mins PT General Charges $$ ACUTE PT VISIT: 1 Visit        Delphia Grates. Fairly IV, PT, DPT Physical Therapist- [place]  [facility]  06/27/2023, 3:58 PM

## 2023-06-27 NOTE — Progress Notes (Signed)
   06/27/23 0120  Provider Notification  Provider Name/Title [facility]  Date Provider Notified 06/27/23  Time Provider Notified 0115  Method of Notification  (secure chat)  Notification Reason Other (Comment) (Pt in with right lower limb ischemia, had angiogram with angioplasty yesterday 9/19. Reports constant pain to right lower extremity 10/10 Given prn oxy, percocet, robaxin and morphine with reassessment 9/10.)  Provider response Other (Comment) (aware, pending orders)  Date of Provider Response 06/27/23  Time of Provider Response 0118   Assessment findings: RLE remains cool to touch, doppler pedal and femoral pulse (nonpalpable), swelling, red. Pain remains throughout entire leg, reports pain relief after procedure, however progressively worsening throughout the evening. No other complaints of pain. No changes in assessment findings from previous assessment. Pt is requesting additional pain medication other than what he has taken or increase in dosage, states "none of that is working".

## 2023-06-27 NOTE — Progress Notes (Signed)
PROGRESS NOTE    Austin [name]  ONG:[id number] DOB: 06-Jan-1956 DOA: 06/25/2023 PCP: Preston Fleeting, MD    Brief Narrative:  67 y.o. male with medical history significant of PVD (s/p of right common femoral to TP trunk bypass with vein on 02/25/2022), HTN, HLD,m DM, CAD, dCHF, A fib on Eliquis, s/p of left BKA, obesity, former smoker, who presents with right leg pain.    Pt states that his right leg pain started this afternoon, which is mainly located in the right lower leg, but also involving the whole right leg.  The pain is constant, throbbing, severe, nonradiating.  Not aggravated or alleviated by any known factors.  No injury. He state that his foot feels and lower leg are cool to touch. Patient does not have chest pain, cough or SOB. No nausea, vomiting, diarrhea or abdominal pain.  Denies symptoms of UTI.  No fever or chills. Pt has does not have palpable pulse in his right foot but does have good biphasic Doppler signal in ED.   9/19: Taken emergently to Cath Lab with vascular surgery.  Successful recannulation right lower extremity for limb salvage.  Patient tolerated procedure well.  Returned to medical floor.  9/20: Worsening pain in right lower extremity   Assessment & Plan:   Principal Problem:   Critical limb ischemia of right lower extremity (HCC) Active Problems:   Peripheral vascular disease (HCC)   Essential hypertension   Hyperlipidemia   Coronary artery disease   Type 2 diabetes mellitus with diabetic peripheral angiopathy without gangrene (HCC)   Atrial fibrillation, chronic (HCC)   DVT (deep venous thrombosis) (HCC)   Obesity (BMI 30-39.9)   Right leg pain  Critical limb ischemia of right lower extremity and hx of peripheral vascular disease: pt is s/p of right common femoral to TP trunk bypass with vein on 02/25/2022. Now has worsening right leg pain, clinically consistent with ischemia limb. EDP consulted [facility] NP, Sheppard Plumber who is with Dr. Gilda Crease of  [facility]. -Status post successful recanalization of right lower extremity for limb salvage Plan: Okay for diet Multimodal pain control Dual antiplatelet therapy Discharge when pain control achieved, anticipate 9/21  Essential hypertension Continue PTA Cozaar.   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   Patient said taking Trulicity, Humalog, Lantus 41 unit daily -Glargine insulin 30 unit daily -Sliding scale insulin -Carb modified diet   Atrial fibrillation, chronic (HCC): Heart rate 83 Resume Eliquis on 9/20   DVT (deep venous thrombosis) (HCC) Resume Eliquis 9/20   Obesity (BMI 30-39.9): Body weight 113.4 kg, BMI 33.91   DVT prophylaxis: Heparin Code Status: Full Family Communication: None today Disposition Plan: Status is: Inpatient Remains inpatient appropriate because: Lower extremity peripheral arterial disease   Level of care: Telemetry Medical  Consultants:  Vascular surgery  Procedures:  Lower extremity angiography  Antimicrobials: None   Subjective: Seen and examined.  More pain this morning right lower extremity  Objective: Vitals:   06/26/23 2040 06/27/23 0110 06/27/23 0400 06/27/23 0802  BP: (!) 124/51 (!) 178/94 136/72 136/82  Pulse: 70 83 67 (!) 50  Resp: 20 17 18 18   Temp: (!) 97.4 F (36.3 C) 97.8 F (36.6 C) 98 F (36.7 C) 97.7 F (36.5 C)  TempSrc: Oral Oral Oral   SpO2: 100% 98% 97% 100%  Weight:  Height:        Intake/Output Summary (Last 24 hours) at 06/27/2023 1128 Last data filed at 06/27/2023 1019 Gross per 24 hour  Intake 720 ml  Output 2550 ml  Net -1830 ml   Filed Weights   06/25/23 1601  Weight: 113.4 kg    Examination:  General exam: Appears calm and comfortable  Respiratory system: Clear to auscultation. Respiratory effort  normal. Cardiovascular system: S1-S2 , regular rate, irregular rhythm, no murmurs, no pedal edema Gastrointestinal system: Obese soft, NT/ND, bowel sounds Central nervous system: Alert and oriented. No focal neurological deficits. Extremities: Left lower extremity AKA.  Right lower extremity decreased pulses Skin: No rashes, lesions or ulcers Psychiatry: Judgement and insight appear normal. Mood & affect appropriate.     Data Reviewed: I have personally reviewed following labs and imaging studies  CBC: Recent Labs  Lab 06/25/23 1610 06/26/23 0417  WBC 9.6 10.1  NEUTROABS 6.3  --   HGB 16.1 13.7  HCT 49.6 41.0  MCV 85.7 83.7  PLT 209 204   Basic Metabolic Panel: Recent Labs  Lab 06/25/23 1610 06/26/23 0417  NA 137 138  K 4.5 4.1  CL 106 106  CO2 22 25  GLUCOSE 132* 182*  BUN 14 13  CREATININE 0.88 0.78  CALCIUM 9.6 8.9   GFR: Estimated Creatinine Clearance: 116.5 mL/min (by C-G formula based on SCr of 0.78 mg/dL). Liver Function Tests: Recent Labs  Lab 06/25/23 1610  AST 26  ALT 20  ALKPHOS 87  BILITOT 1.2  PROT 8.1  ALBUMIN 4.2   No results for input(s): "LIPASE", "AMYLASE" in the last 168 hours. No results for input(s): "AMMONIA" in the last 168 hours. Coagulation Profile: Recent Labs  Lab 06/25/23 1812  INR 1.2   Cardiac Enzymes: No results for input(s): "CKTOTAL", "CKMB", "CKMBINDEX", "TROPONINI" in the last 168 hours. BNP (last 3 results) No results for input(s): "PROBNP" in the last 8760 hours. HbA1C: No results for input(s): "HGBA1C" in the last 72 hours. CBG: Recent Labs  Lab 06/26/23 1041 06/26/23 1210 06/26/23 1654 06/26/23 2111 06/27/23 0805  GLUCAP 158* 137* 187* 137* 153*   Lipid Profile: No results for input(s): "CHOL", "HDL", "LDLCALC", "TRIG", "CHOLHDL", "LDLDIRECT" in the last 72 hours. Thyroid Function Tests: No results for input(s): "TSH", "T4TOTAL", "FREET4", "T3FREE", "THYROIDAB" in the last 72 hours. Anemia Panel: No  results for input(s): "VITAMINB12", "FOLATE", "FERRITIN", "TIBC", "IRON", "RETICCTPCT" in the last 72 hours. Sepsis Labs: No results for input(s): "PROCALCITON", "LATICACIDVEN" in the last 168 hours.  No results found for this or any previous visit (from the past 240 hour(s)).       Radiology Studies: PERIPHERAL VASCULAR CATHETERIZATION  Result Date: 06/26/2023 See surgical note for result.       Scheduled Meds:  acidophilus  1 capsule Oral QPC supper   apixaban  2.5 mg Oral BID   ascorbic acid  500 mg Oral BID WC   aspirin EC  81 mg Oral Daily   atorvastatin  80 mg Oral q1800   cholecalciferol  5,000 Units Oral Daily   clopidogrel  75 mg Oral Q breakfast   gabapentin  300 mg Oral BID   insulin aspart  0-5 Units Subcutaneous QHS   insulin aspart  0-9 Units Subcutaneous TID WC   insulin glargine-yfgn  30 Units Subcutaneous QHS   losartan  25 mg Oral Daily   methocarbamol  500 mg Oral QID   nicotine  21 mg Transdermal Daily   sodium chloride  flush  3 mL Intravenous Q12H   zinc sulfate  220 mg Oral QPC supper   Continuous Infusions:  sodium chloride       LOS: 2 days      Tresa Moore, MD [facility]   If 7PM-7AM, please contact night-coverage  06/27/2023, 11:28 AM

## 2023-06-27 NOTE — Care Management Important Message (Signed)
Important Message  Patient Details  Name: Austin [name] MRN: [id number] Date of Birth: June 28, 1956   Medicare Important Message Given:  Yes  Initial consent for Medicare IM obtained.  Copy of Medicare IM left in room with patient for reference, original to be scanned into chart.   [name] 06/27/2023, 3:06 PM

## 2023-06-27 NOTE — Progress Notes (Signed)
  Progress Note    06/27/2023 11:05 AM 1 Day Post-Op  Subjective:  Austin [name] is a 67 yo male who is now POD #1 right lower extremity angiogram with angioplasty. Patient resting comfortably in bed this morning. Patient endorses soreness to his right thigh. Patient endorses his right leg feels warmer today. Pain is tolerable.    Vitals:   06/27/23 0400 06/27/23 0802  BP: 136/72 136/82  Pulse: 67 (!) 50  Resp: 18 18  Temp: 98 F (36.7 C) 97.7 F (36.5 C)  SpO2: 97% 100%   Physical Exam: Cardiac:  RRR, Normal S1, S2. No murmurs Lungs:  Normal Respiratory effort. Clear throughout on auscultation. No rales, rhonchi or wheezing.  Incisions:  Right groin without hematoma or seroma. No edema, clean dry and intact.  Extremities:  Right lower extremity wit positive doppler pulses and warm to touch.  Abdomen:  Positive bowel sounds, soft, non tender and non distended Neurologic: AAOX3, follows commands and answers questions appropriately.   CBC    Component Value Date/Time   WBC 10.1 06/26/2023 0417   RBC 4.90 06/26/2023 0417   HGB 13.7 06/26/2023 0417   HGB 14.1 11/17/2013 0621   HCT 41.0 06/26/2023 0417   HCT 41.0 02/16/2022 0608   PLT 204 06/26/2023 0417   PLT 218 11/17/2013 0621   MCV 83.7 06/26/2023 0417   MCV 86 11/17/2013 0621   MCH 28.0 06/26/2023 0417   MCHC 33.4 06/26/2023 0417   RDW 15.4 06/26/2023 0417   RDW 13.6 11/17/2013 0621   LYMPHSABS 2.7 06/25/2023 1610   LYMPHSABS 2.4 11/17/2013 0621   MONOABS 0.6 06/25/2023 1610   MONOABS 0.9 11/17/2013 0621   EOSABS 0.0 06/25/2023 1610   EOSABS 0.1 11/17/2013 0621   BASOSABS 0.0 06/25/2023 1610   BASOSABS 0.0 11/17/2013 0621    BMET    Component Value Date/Time   NA 138 06/26/2023 0417   NA 135 (L) 11/17/2013 0621   K 4.1 06/26/2023 0417   K 4.3 11/17/2013 0621   CL 106 06/26/2023 0417   CL 99 11/17/2013 0621   CO2 25 06/26/2023 0417   CO2 31 11/17/2013 0621   GLUCOSE 182 (H) 06/26/2023 0417   GLUCOSE  172 (H) 11/17/2013 0621   BUN 13 06/26/2023 0417   BUN 10 11/17/2013 0621   CREATININE 0.78 06/26/2023 0417   CREATININE 1.05 04/18/2022 1034   CALCIUM 8.9 06/26/2023 0417   CALCIUM 9.6 11/17/2013 0621   GFRNONAA >60 06/26/2023 0417   GFRNONAA >60 11/17/2013 0621   GFRAA >60 01/10/2020 0808   GFRAA >60 11/17/2013 0621    INR    Component Value Date/Time   INR 1.2 06/25/2023 1812     Intake/Output Summary (Last 24 hours) at 06/27/2023 1105 Last data filed at 06/27/2023 1019 Gross per 24 hour  Intake 720 ml  Output 2550 ml  Net -1830 ml     Assessment/Plan:  67 y.o. male is s/p Right lower extremity angiogram with angioplasty 1 Day Post-Op Patient recovering as expected.    PLAN: Advance diet as tolerated.  Pain medications Prn.  Continue PT/OT   DVT prophylaxis:  Eliquis 2.5 mg twice daily ASA 81 mg daily and Plavix 75 mg Daily.    Marcie Bal [facility] 06/27/2023 11:05 AM

## 2023-06-28 DIAGNOSIS — I70221 Atherosclerosis of native arteries of extremities with rest pain, right leg: Secondary | ICD-10-CM | POA: Diagnosis not present

## 2023-06-28 LAB — GLUCOSE, CAPILLARY
Glucose-Capillary: 188 mg/dL — ABNORMAL HIGH (ref 70–99)
Glucose-Capillary: 250 mg/dL — ABNORMAL HIGH (ref 70–99)
Glucose-Capillary: 199 mg/dL — ABNORMAL HIGH (ref 70–99)
Glucose-Capillary: 225 mg/dL — ABNORMAL HIGH (ref 70–99)
Glucose-Capillary: 219 mg/dL — ABNORMAL HIGH (ref 70–99)

## 2023-06-28 MED ORDER — SENNOSIDES-DOCUSATE SODIUM 8.6-50 MG PO TABS
1.0000 | ORAL_TABLET | Freq: Two times a day (BID) | ORAL | Status: DC
  Administered 2023-06-28 – 2023-07-01 (×6): 1 via ORAL
  Filled 2023-06-28 (×6): qty 1

## 2023-06-28 MED ORDER — POLYETHYLENE GLYCOL 3350 17 G PO PACK
17.0000 g | PACK | Freq: Every day | ORAL | Status: DC
  Administered 2023-06-28 – 2023-07-01 (×4): 17 g via ORAL
  Filled 2023-06-28 (×4): qty 1

## 2023-06-28 MED ORDER — GABAPENTIN 300 MG PO CAPS
300.0000 mg | ORAL_CAPSULE | Freq: Three times a day (TID) | ORAL | Status: DC
  Administered 2023-06-28 – 2023-07-01 (×10): 300 mg via ORAL
  Filled 2023-06-28 (×10): qty 1

## 2023-06-28 NOTE — Progress Notes (Signed)
PROGRESS NOTE    Austin [name]  XBM:[id number] DOB: Mar 09, 1956 DOA: 06/25/2023 PCP: Preston Fleeting, MD    Brief Narrative:  67 y.o. male with medical history significant of PVD (s/p of right common femoral to TP trunk bypass with vein on 02/25/2022), HTN, HLD,m DM, CAD, dCHF, A fib on Eliquis, s/p of left BKA, obesity, former smoker, who presents with right leg pain.    Pt states that his right leg pain started this afternoon, which is mainly located in the right lower leg, but also involving the whole right leg.  The pain is constant, throbbing, severe, nonradiating.  Not aggravated or alleviated by any known factors.  No injury. He state that his foot feels and lower leg are cool to touch. Patient does not have chest pain, cough or SOB. No nausea, vomiting, diarrhea or abdominal pain.  Denies symptoms of UTI.  No fever or chills. Pt has does not have palpable pulse in his right foot but does have good biphasic Doppler signal in ED.   9/19: Taken emergently to Cath Lab with vascular surgery.  Successful recannulation right lower extremity for limb salvage.  Patient tolerated procedure well.  Returned to medical floor.  9/20: Worsening pain in right lower extremity 9/21: No acute events   Assessment & Plan:   Principal Problem:   Critical limb ischemia of right lower extremity (HCC) Active Problems:   Peripheral vascular disease (HCC)   Essential hypertension   Hyperlipidemia   Coronary artery disease   Type 2 diabetes mellitus with diabetic peripheral angiopathy without gangrene (HCC)   Atrial fibrillation, chronic (HCC)   DVT (deep venous thrombosis) (HCC)   Obesity (BMI 30-39.9)   Right leg pain  Critical limb ischemia of right lower extremity and hx of peripheral vascular disease: pt is s/p of right common femoral to TP trunk bypass with vein on 02/25/2022. Now has worsening right leg pain, clinically consistent with ischemia limb. EDP consulted [facility] NP, Sheppard Plumber who  is with Dr. Gilda Crease of [facility]. -Status post successful recanalization of right lower extremity for limb salvage Plan: Diet as tolerated Multimodal pain control, avoid IV narcotics Dual antiplatelet therapy Cleared for discharge Will need skilled facility on discharge TOC aware  Essential hypertension Continue PTA Cozaar.   IV hydralazine as needed  Hyperlipidemia PTA Lipitor   Coronary artery disease PTA statin   Type 2 diabetes mellitus with diabetic peripheral angiopathy without gangrene [facility]):  Recent A1c 11.3, poorly controlled.   Patient said taking Trulicity, Humalog, Lantus 41 unit daily -Continue Semglee 30 units daily -Sliding scale insulin -Carb modified diet   Atrial fibrillation, chronic (HCC): Heart rate 83 Eliquis   DVT (deep venous thrombosis) (HCC) Course   Obesity (BMI 30-39.9): Body weight 113.4 kg, BMI 33.91   DVT prophylaxis: Heparin Code Status: Full Family Communication: None today Disposition Plan: Status is: Inpatient Remains inpatient appropriate because: Lower extremity peripheral arterial disease   Level of care: Telemetry Medical  Consultants:  Vascular surgery  Procedures:  Lower extremity angiography  Antimicrobials: None   Subjective: Seen examined.  Overall improved.  Still having some pain in right lower extremity  Objective: Vitals:   06/27/23 1555 06/27/23 2132 06/28/23 0611 06/28/23 0740  BP: (!) 144/80 (!) 117/55 (!) 153/72 112/74  Pulse: (!) 51 70 80 (!) 53  Resp: 16 17 17 16   Temp: 97.7 F (36.5 C) 97.7 F (36.5 C) (!) 97.5 F (36.4 C) 98.5 F (36.9 C)  TempSrc:  Oral Oral  SpO2: 97% 98% 100% 94%  Weight:      Height:        Intake/Output Summary (Last 24 hours) at 06/28/2023 1138 Last data filed at 06/28/2023 1014 Gross per 24 hour  Intake 360 ml  Output 2175 ml  Net -1815 ml   Filed Weights   06/25/23 1601  Weight: 113.4 kg    Examination:  General exam: NAD Respiratory system: Clear to  auscultation. Respiratory effort normal. Cardiovascular system: S/2, RRR, no murmurs, no pedal edema Gastrointestinal system: Obese soft, NT/ND, bowel sounds Central nervous system: Alert and oriented. No focal neurological deficits. Extremities: Left lower extremity AKA.  Right lower extremity decreased pulses Skin: No rashes, lesions or ulcers Psychiatry: Judgement and insight appear normal. Mood & affect appropriate.     Data Reviewed: I have personally reviewed following labs and imaging studies  CBC: Recent Labs  Lab 06/25/23 1610 06/26/23 0417  WBC 9.6 10.1  NEUTROABS 6.3  --   HGB 16.1 13.7  HCT 49.6 41.0  MCV 85.7 83.7  PLT 209 204   Basic Metabolic Panel: Recent Labs  Lab 06/25/23 1610 06/26/23 0417  NA 137 138  K 4.5 4.1  CL 106 106  CO2 22 25  GLUCOSE 132* 182*  BUN 14 13  CREATININE 0.88 0.78  CALCIUM 9.6 8.9   GFR: Estimated Creatinine Clearance: 116.5 mL/min (by C-G formula based on SCr of 0.78 mg/dL). Liver Function Tests: Recent Labs  Lab 06/25/23 1610  AST 26  ALT 20  ALKPHOS 87  BILITOT 1.2  PROT 8.1  ALBUMIN 4.2   No results for input(s): "LIPASE", "AMYLASE" in the last 168 hours. No results for input(s): "AMMONIA" in the last 168 hours. Coagulation Profile: Recent Labs  Lab 06/25/23 1812  INR 1.2   Cardiac Enzymes: No results for input(s): "CKTOTAL", "CKMB", "CKMBINDEX", "TROPONINI" in the last 168 hours. BNP (last 3 results) No results for input(s): "PROBNP" in the last 8760 hours. HbA1C: No results for input(s): "HGBA1C" in the last 72 hours. CBG: Recent Labs  Lab 06/27/23 1148 06/27/23 1557 06/27/23 2134 06/28/23 0742 06/28/23 1030  GLUCAP 190* 153* 158* 188* 250*   Lipid Profile: No results for input(s): "CHOL", "HDL", "LDLCALC", "TRIG", "CHOLHDL", "LDLDIRECT" in the last 72 hours. Thyroid Function Tests: No results for input(s): "TSH", "T4TOTAL", "FREET4", "T3FREE", "THYROIDAB" in the last 72 hours. Anemia  Panel: No results for input(s): "VITAMINB12", "FOLATE", "FERRITIN", "TIBC", "IRON", "RETICCTPCT" in the last 72 hours. Sepsis Labs: No results for input(s): "PROCALCITON", "LATICACIDVEN" in the last 168 hours.  No results found for this or any previous visit (from the past 240 hour(s)).       Radiology Studies: No results found.      Scheduled Meds:  acidophilus  1 capsule Oral QPC supper   apixaban  2.5 mg Oral BID   ascorbic acid  500 mg Oral BID WC   aspirin EC  81 mg Oral Daily   atorvastatin  80 mg Oral q1800   cholecalciferol  5,000 Units Oral Daily   clopidogrel  75 mg Oral Q breakfast   gabapentin  300 mg Oral TID   insulin aspart  0-5 Units Subcutaneous QHS   insulin aspart  0-9 Units Subcutaneous TID WC   insulin glargine-yfgn  30 Units Subcutaneous QHS   losartan  25 mg Oral Daily   methocarbamol  500 mg Oral QID   nicotine  21 mg Transdermal Daily   sodium chloride flush  3 mL Intravenous Q12H  zinc sulfate  220 mg Oral QPC supper   Continuous Infusions:  sodium chloride       LOS: 3 days      Tresa Moore, MD [facility]   If 7PM-7AM, please contact night-coverage  06/28/2023, 11:38 AM

## 2023-06-28 NOTE — Plan of Care (Signed)
  Problem: Education: Goal: Ability to describe self-care measures that may prevent or decrease complications (Diabetes Survival Skills Education) will improve Outcome: Progressing Goal: Individualized Educational Video(s) Outcome: Progressing   Problem: Coping: Goal: Ability to adjust to condition or change in health will improve Outcome: Progressing   Problem: Fluid Volume: Goal: Ability to maintain a balanced intake and output will improve Outcome: Progressing   Problem: Metabolic: Goal: Ability to maintain appropriate glucose levels will improve Outcome: Progressing   Problem: Health Behavior/Discharge Planning: Goal: Ability to identify and utilize available resources and services will improve Outcome: Progressing Goal: Ability to manage health-related needs will improve Outcome: Progressing   Problem: Nutritional: Goal: Maintenance of adequate nutrition will improve Outcome: Progressing Goal: Progress toward achieving an optimal weight will improve Outcome: Progressing   Problem: Skin Integrity: Goal: Risk for impaired skin integrity will decrease Outcome: Progressing   Problem: Tissue Perfusion: Goal: Adequacy of tissue perfusion will improve Outcome: Progressing   Problem: Education: Goal: Knowledge of General Education information will improve Description: Including pain rating scale, medication(s)/side effects and non-pharmacologic comfort measures Outcome: Progressing   Problem: Health Behavior/Discharge Planning: Goal: Ability to manage health-related needs will improve Outcome: Progressing   Problem: Clinical Measurements: Goal: Ability to maintain clinical measurements within normal limits will improve Outcome: Progressing Goal: Will remain free from infection Outcome: Progressing Goal: Diagnostic test results will improve Outcome: Progressing Goal: Respiratory complications will improve Outcome: Progressing Goal: Cardiovascular complication will  be avoided Outcome: Progressing   Problem: Activity: Goal: Risk for activity intolerance will decrease Outcome: Progressing   Problem: Nutrition: Goal: Adequate nutrition will be maintained Outcome: Progressing   Problem: Coping: Goal: Level of anxiety will decrease Outcome: Progressing   Problem: Elimination: Goal: Will not experience complications related to bowel motility Outcome: Progressing Goal: Will not experience complications related to urinary retention Outcome: Progressing   Problem: Pain Managment: Goal: General experience of comfort will improve Outcome: Progressing   Problem: Safety: Goal: Ability to remain free from injury will improve Outcome: Progressing   Problem: Skin Integrity: Goal: Risk for impaired skin integrity will decrease Outcome: Progressing   Problem: Education: Goal: Understanding of CV disease, CV risk reduction, and recovery process will improve Outcome: Progressing   Problem: Activity: Goal: Ability to return to baseline activity level will improve Outcome: Progressing   Problem: Cardiovascular: Goal: Ability to achieve and maintain adequate cardiovascular perfusion will improve Outcome: Progressing Goal: Vascular access site(s) Level 0-1 will be maintained Outcome: Progressing   Problem: Health Behavior/Discharge Planning: Goal: Ability to safely manage health-related needs after discharge will improve Outcome: Progressing

## 2023-06-28 NOTE — Plan of Care (Signed)
Problem: Coping: Goal: Ability to adjust to condition or change in health will improve Outcome: Progressing   Problem: Fluid Volume: Goal: Ability to maintain a balanced intake and output will improve Outcome: Progressing

## 2023-06-29 DIAGNOSIS — I70221 Atherosclerosis of native arteries of extremities with rest pain, right leg: Secondary | ICD-10-CM | POA: Diagnosis not present

## 2023-06-29 LAB — GLUCOSE, CAPILLARY
Glucose-Capillary: 179 mg/dL — ABNORMAL HIGH (ref 70–99)
Glucose-Capillary: 290 mg/dL — ABNORMAL HIGH (ref 70–99)
Glucose-Capillary: 248 mg/dL — ABNORMAL HIGH (ref 70–99)
Glucose-Capillary: 234 mg/dL — ABNORMAL HIGH (ref 70–99)

## 2023-06-29 MED ORDER — INSULIN GLARGINE-YFGN 100 UNIT/ML ~~LOC~~ SOLN
33.0000 [IU] | Freq: Every day | SUBCUTANEOUS | Status: DC
  Administered 2023-06-29: 33 [IU] via SUBCUTANEOUS
  Filled 2023-06-29 (×2): qty 0.33

## 2023-06-29 NOTE — [place] FL2 (Signed)
[place] MEDICAID FL2 LEVEL OF CARE FORM     IDENTIFICATION  Patient Name: Austin [name] Birthdate: September 05, 1956 Sex: male Admission Date (Current Location): 06/25/2023  [facility] and [place] Number:  [address] [id number] L Facility and Address:  [facility], [address] [place], [place] [address]      Provider Number: [id number]  Attending Physician Name and Address:  Tresa Moore, MD  Relative Name and Phone Number:  Joris, Arrue (Son)  [phone] [facility])    Current Level of Care: Hospital Recommended Level of Care: Skilled Nursing Facility Prior Approval Number: [id number]  Date Approved/Denied: 10/02/18 (For [facility] Care.) PASRR Number: [id number]  Discharge Plan: SNF    Current Diagnoses: Patient Active Problem List   Diagnosis Date Noted   Right leg pain 06/26/2023   Obesity (BMI 30-39.9) 06/25/2023   DVT (deep venous thrombosis) (HCC)    Critical limb ischemia of right lower extremity (HCC) 04/23/2022   Medication monitoring encounter 03/29/2022   Diabetic foot infection (HCC) 03/29/2022   Acute blood loss anemia 03/21/2022   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) 03/08/2022   Altered mental status    Osteomyelitis of fifth toe of right foot (HCC)    Diabetic foot ulcer associated with type 2 diabetes mellitus (HCC) 02/16/2022   Atrial fibrillation, chronic (HCC) 02/16/2022   Infection of left hand 09/06/2021   Pressure injury of coccygeal region, stage 1 09/06/2021   Coronary artery disease    Peripheral vascular disease (HCC)    Lower limb ulcer, calf (HCC) 07/13/2021   Acute osteomyelitis of right foot (HCC) 03/30/2021   Diabetes mellitus type 2, uncomplicated (HCC) 09/16/2019   Atherosclerosis of native arteries of the extremities with ulceration (HCC) 01/01/2019   Status post below-knee amputation (HCC) - left side 11/30/2018   Type 2 diabetes mellitus with diabetic  peripheral angiopathy without gangrene (HCC) 10/14/2018   Malnutrition of moderate degree 10/02/2018   Foot ulcer (HCC) 09/29/2018   Atherosclerosis of native arteries of extremity with intermittent claudication (HCC) 05/25/2017   Essential hypertension 05/25/2017   Hyperlipidemia 05/25/2017    Orientation RESPIRATION BLADDER Height & Weight     Self, Time, Situation, Place  Normal Continent Weight: 113.4 kg Height:  6' (182.9 cm)  BEHAVIORAL SYMPTOMS/MOOD NEUROLOGICAL BOWEL NUTRITION STATUS      Continent Diet  AMBULATORY STATUS COMMUNICATION OF NEEDS Skin   Extensive Assist Verbally Surgical wounds, Other (Comment) (LEFT BKA  with prosthetic and recent recannulization RLE for limb salvage)                       Personal Care Assistance Level of Assistance  Bathing, Dressing Bathing Assistance: Limited assistance   Dressing Assistance: Limited assistance     Functional Limitations Info             SPECIAL CARE FACTORS FREQUENCY  PT (By licensed PT), OT (By licensed OT)     PT Frequency: 5x/week OT Frequency: 5x/week            Contractures Contractures Info: Not present    Additional Factors Info  Code Status, Allergies Code Status Info: Full Code Allergies Info: No Known Allergies           Current Medications (06/29/2023):  This is the current hospital active medication list Current Facility-Administered Medications  Medication Dose Route Frequency Provider Last Rate Last Admin   0.9 %  sodium chloride infusion  250 mL Intravenous  PRN Schnier, Latina Craver, MD       acetaminophen (TYLENOL) tablet 650 mg  650 mg Oral Q4H PRN Schnier, Latina Craver, MD   650 mg at 06/29/23 0119   acidophilus (RISAQUAD) capsule 1 capsule  1 capsule Oral QPC supper Schnier, Latina Craver, MD   1 capsule at 06/28/23 1807   apixaban (ELIQUIS) tablet 2.5 mg  2.5 mg Oral BID Schnier, Latina Craver, MD   2.5 mg at 06/29/23 0840   ascorbic acid (VITAMIN C) tablet 500 mg  500 mg Oral BID WC  Schnier, Latina Craver, MD   500 mg at 06/29/23 0841   aspirin EC tablet 81 mg  81 mg Oral Daily Renford Dills, MD   81 mg at 06/29/23 0840   atorvastatin (LIPITOR) tablet 80 mg  80 mg Oral q1800 Renford Dills, MD   80 mg at 06/28/23 1808   cholecalciferol (VITAMIN D3) 25 MCG (1000 UNIT) tablet 5,000 Units  5,000 Units Oral Daily Schnier, Latina Craver, MD   5,000 Units at 06/29/23 0841   clopidogrel (PLAVIX) tablet 75 mg  75 mg Oral Q breakfast Schnier, Latina Craver, MD   75 mg at 06/29/23 0841   gabapentin (NEURONTIN) capsule 300 mg  300 mg Oral TID Lolita Patella B, MD   300 mg at 06/29/23 0841   hydrALAZINE (APRESOLINE) injection 5 mg  5 mg Intravenous Q2H PRN Schnier, Latina Craver, MD       HYDROmorphone (DILAUDID) injection 0.5 mg  0.5 mg Intravenous Q4H PRN Lolita Patella B, MD   0.5 mg at 06/29/23 0553   insulin aspart (novoLOG) injection 0-5 Units  0-5 Units Subcutaneous QHS Renford Dills, MD   2 Units at 06/28/23 2121   insulin aspart (novoLOG) injection 0-9 Units  0-9 Units Subcutaneous TID WC Schnier, Latina Craver, MD   5 Units at 06/29/23 1212   insulin glargine-yfgn (SEMGLEE) injection 33 Units  33 Units Subcutaneous QHS Sreenath, Sudheer B, MD       losartan (COZAAR) tablet 25 mg  25 mg Oral Daily Schnier, Latina Craver, MD   25 mg at 06/29/23 0841   methocarbamol (ROBAXIN) tablet 500 mg  500 mg Oral QID Lolita Patella B, MD   500 mg at 06/29/23 1441   nicotine (NICODERM CQ - dosed in mg/24 hours) patch 21 mg  21 mg Transdermal Daily Schnier, Latina Craver, MD   21 mg at 06/29/23 0841   ondansetron (ZOFRAN) injection 4 mg  4 mg Intravenous Q6H PRN Schnier, Latina Craver, MD       oxyCODONE (Oxy IR/ROXICODONE) immediate release tablet 5-10 mg  5-10 mg Oral Q4H PRN Schnier, Latina Craver, MD   10 mg at 06/29/23 0119   polyethylene glycol (MIRALAX / GLYCOLAX) packet 17 g  17 g Oral Daily Lolita Patella B, MD   17 g at 06/29/23 0840   senna-docusate (Senokot-S) tablet 1 tablet  1 tablet Oral  BID Lolita Patella B, MD   1 tablet at 06/29/23 0840   sodium chloride flush (NS) 0.9 % injection 3 mL  3 mL Intravenous Q12H Schnier, Latina Craver, MD   3 mL at 06/29/23 0841   sodium chloride flush (NS) 0.9 % injection 3 mL  3 mL Intravenous PRN Schnier, Latina Craver, MD       zinc sulfate capsule 220 mg  220 mg Oral QPC supper Schnier, Latina Craver, MD   220 mg at 06/28/23 1808     Discharge Medications: Please see discharge  summary for a list of discharge medications.  Relevant Imaging Results:  Relevant Lab Results:   Additional Information SSN: [id number]  Bing Quarry, RN

## 2023-06-29 NOTE — Plan of Care (Signed)

## 2023-06-29 NOTE — TOC Progression Note (Signed)
Transition of Care [facility]) - Progression Note    Patient Details  Name: Austin [name] MRN: [id number] Date of Birth: 30-May-1956  Transition of Care [facility]) CM/SW Contact  Bing Quarry, RN Phone Number: 06/29/2023, 3:30 PM  Clinical Narrative: 06/29/23: New PT recommendation for STR/SNF. Spoke with patient and he was a resident at [facility] in last 2019/early 2020 and chose to return there for STR. Offered him a list of other options but he chose [facility]. Will submit bed search. PASRR from 10/02/2018 is  #[id number].    Gabriel Cirri MSN RN CM  Transitions of Care Department [facility] [phone] Weekends Only          Expected Discharge Plan and Services                                               Social Determinants of Health (SDOH) Interventions SDOH Screenings   Food Insecurity: No Food Insecurity (06/25/2023)  Housing: Low Risk  (06/25/2023)  Transportation Needs: No Transportation Needs (06/25/2023)  Utilities: Not At Risk (06/25/2023)  Depression (PHQ2-9): Low Risk  (06/25/2022)  Tobacco Use: Medium Risk (01/16/2023)    Readmission Risk Interventions     No data to display

## 2023-06-29 NOTE — Progress Notes (Signed)
PROGRESS NOTE    Austin [name]  NWG:[id number] DOB: [birth year] DOA: 06/25/2023 PCP: Preston Fleeting, MD    Brief Narrative:  67 y.o. male with medical history significant of PVD (s/p of right common femoral to TP trunk bypass with vein on 02/25/2022), HTN, HLD,m DM, CAD, dCHF, A fib on Eliquis, s/p of left BKA, obesity, former smoker, who presents with right leg pain.    Pt states that his right leg pain started this afternoon, which is mainly located in the right lower leg, but also involving the whole right leg.  The pain is constant, throbbing, severe, nonradiating.  Not aggravated or alleviated by any known factors.  No injury. He state that his foot feels and lower leg are cool to touch. Patient does not have chest pain, cough or SOB. No nausea, vomiting, diarrhea or abdominal pain.  Denies symptoms of UTI.  No fever or chills. Pt has does not have palpable pulse in his right foot but does have good biphasic Doppler signal in ED.   9/19: Taken emergently to Cath Lab with vascular surgery.  Successful recannulation right lower extremity for limb salvage.  Patient tolerated procedure well.  Returned to medical floor.  9/20: Worsening pain in right lower extremity 9/21: No acute events   Assessment & Plan:   Principal Problem:   Critical limb ischemia of right lower extremity (HCC) Active Problems:   Peripheral vascular disease (HCC)   Essential hypertension   Hyperlipidemia   Coronary artery disease   Type 2 diabetes mellitus with diabetic peripheral angiopathy without gangrene (HCC)   Atrial fibrillation, chronic (HCC)   DVT (deep venous thrombosis) (HCC)   Obesity (BMI 30-39.9)   Right leg pain  Critical limb ischemia of right lower extremity and hx of peripheral vascular disease: pt is s/p of right common femoral to TP trunk bypass with vein on 02/25/2022. Now has worsening right leg pain, clinically consistent with ischemia limb. EDP consulted [facility] NP, Sheppard Plumber who  is with Dr. Gilda Crease of [facility]. -Status post successful recanalization of right lower extremity for limb salvage Plan: Diet as tolerated Multimodal pain control.  P.o. medications preferred Dual antiplatelet therapy Cleared for discharge Will need skilled facility on discharge [facility] aware Medically ready for discharge  Essential hypertension Continue PTA Cozaar.   IV hydralazine as needed  Hyperlipidemia PTA Lipitor   Coronary artery disease PTA statin   Type 2 diabetes mellitus with diabetic peripheral angiopathy without gangrene [facility]):  Recent A1c 11.3, poorly controlled.   Patient said taking Trulicity, Humalog, Lantus 41 unit daily Plan: -Increase Semglee 33 units nightly -Sliding scale insulin -Carb modified diet   Atrial fibrillation, chronic (HCC): Heart rate 83 Eliquis   DVT (deep venous thrombosis) (HCC) Course   Obesity (BMI 30-39.9): Body weight 113.4 kg, BMI 33.91   DVT prophylaxis: Heparin Code Status: Full Family Communication: None today Disposition Plan: Status is: Inpatient Remains inpatient appropriate because: Lower extremity peripheral arterial disease   Level of care: Telemetry Medical  Consultants:  Vascular surgery  Procedures:  Lower extremity angiography  Antimicrobials: None   Subjective: Seen and examined.  Pain control improving.  No acute overnight events  Objective: Vitals:   06/28/23 1637 06/28/23 2025 06/29/23 0429 06/29/23 0822  BP: (!) 140/125 (!) 153/121 (!) 143/74 124/75  Pulse: 81 82 69 (!) 48  Resp: 16 19 17 18   Temp: 98.2 F (36.8 C)  (!) 97.5 F (36.4 C) 97.6 F (36.4 C)  TempSrc:  SpO2: 95% 99% 98% 100%  Weight:      Height:        Intake/Output Summary (Last 24 hours) at 06/29/2023 1150 Last data filed at 06/29/2023 1120 Gross per 24 hour  Intake 723 ml  Output 1350 ml  Net -627 ml   Filed Weights   06/25/23 1601  Weight: 113.4 kg    Examination:  General exam: No acute distress Respiratory  system: Lung clear.  Normal work of breathing.  Room air Cardiovascular system: S/2, RRR, no murmurs, no pedal edema Gastrointestinal system: Obese soft, NT/ND, bowel sounds Central nervous system: Alert and oriented. No focal neurological deficits. Extremities: Left lower extremity AKA.  Right lower extremity decreased pulses Skin: No rashes, lesions or ulcers Psychiatry: Judgement and insight appear normal. Mood & affect appropriate.     Data Reviewed: I have personally reviewed following labs and imaging studies  CBC: Recent Labs  Lab 06/25/23 1610 06/26/23 0417  WBC 9.6 10.1  NEUTROABS 6.3  --   HGB 16.1 13.7  HCT 49.6 41.0  MCV 85.7 83.7  PLT 209 204   Basic Metabolic Panel: Recent Labs  Lab 06/25/23 1610 06/26/23 0417  NA 137 138  K 4.5 4.1  CL 106 106  CO2 22 25  GLUCOSE 132* 182*  BUN 14 13  CREATININE 0.88 0.78  CALCIUM 9.6 8.9   GFR: Estimated Creatinine Clearance: 116.5 mL/min (by C-G formula based on SCr of 0.78 mg/dL). Liver Function Tests: Recent Labs  Lab 06/25/23 1610  AST 26  ALT 20  ALKPHOS 87  BILITOT 1.2  PROT 8.1  ALBUMIN 4.2   No results for input(s): "LIPASE", "AMYLASE" in the last 168 hours. No results for input(s): "AMMONIA" in the last 168 hours. Coagulation Profile: Recent Labs  Lab 06/25/23 1812  INR 1.2   Cardiac Enzymes: No results for input(s): "CKTOTAL", "CKMB", "CKMBINDEX", "TROPONINI" in the last 168 hours. BNP (last 3 results) No results for input(s): "PROBNP" in the last 8760 hours. HbA1C: No results for input(s): "HGBA1C" in the last 72 hours. CBG: Recent Labs  Lab 06/28/23 1030 06/28/23 1202 06/28/23 1635 06/28/23 2059 06/29/23 0818  GLUCAP 250* 199* 225* 219* 179*   Lipid Profile: No results for input(s): "CHOL", "HDL", "LDLCALC", "TRIG", "CHOLHDL", "LDLDIRECT" in the last 72 hours. Thyroid Function Tests: No results for input(s): "TSH", "T4TOTAL", "FREET4", "T3FREE", "THYROIDAB" in the last 72  hours. Anemia Panel: No results for input(s): "VITAMINB12", "FOLATE", "FERRITIN", "TIBC", "IRON", "RETICCTPCT" in the last 72 hours. Sepsis Labs: No results for input(s): "PROCALCITON", "LATICACIDVEN" in the last 168 hours.  No results found for this or any previous visit (from the past 240 hour(s)).       Radiology Studies: No results found.      Scheduled Meds:  acidophilus  1 capsule Oral QPC supper   apixaban  2.5 mg Oral BID   ascorbic acid  500 mg Oral BID WC   aspirin EC  81 mg Oral Daily   atorvastatin  80 mg Oral q1800   cholecalciferol  5,000 Units Oral Daily   clopidogrel  75 mg Oral Q breakfast   gabapentin  300 mg Oral TID   insulin aspart  0-5 Units Subcutaneous QHS   insulin aspart  0-9 Units Subcutaneous TID WC   insulin glargine-yfgn  30 Units Subcutaneous QHS   losartan  25 mg Oral Daily   methocarbamol  500 mg Oral QID   nicotine  21 mg Transdermal Daily   polyethylene glycol  17 g Oral Daily   senna-docusate  1 tablet Oral BID   sodium chloride flush  3 mL Intravenous Q12H   zinc sulfate  220 mg Oral QPC supper   Continuous Infusions:  sodium chloride       LOS: 4 days      Tresa Moore, MD [facility]   If 7PM-7AM, please contact night-coverage  06/29/2023, 11:50 AM

## 2023-06-30 ENCOUNTER — Encounter: Payer: 59 | Admitting: Physical Therapy

## 2023-06-30 ENCOUNTER — Encounter
Admission: EM | Disposition: A | Discharge status: Skilled Nursing Facility | Payer: Self-pay | Source: Home / Self Care | Attending: Internal Medicine

## 2023-06-30 DIAGNOSIS — I70221 Atherosclerosis of native arteries of extremities with rest pain, right leg: Secondary | ICD-10-CM | POA: Diagnosis not present

## 2023-06-30 LAB — BASIC METABOLIC PANEL
Anion gap: 9 (ref 5–15)
BUN: 18 mg/dL (ref 8–23)
CO2: 25 mmol/L (ref 22–32)
Calcium: 9.5 mg/dL (ref 8.9–10.3)
Chloride: 104 mmol/L (ref 98–111)
Creatinine, Ser: 0.82 mg/dL (ref 0.61–1.24)
GFR, Estimated: >60 mL/min (ref >60)
Glucose, Bld: 289 mg/dL — ABNORMAL HIGH (ref 70–99)
Potassium: 4.5 mmol/L (ref 3.5–5.1)
Sodium: 138 mmol/L (ref 135–145)

## 2023-06-30 LAB — GLUCOSE, CAPILLARY
Glucose-Capillary: 263 mg/dL — ABNORMAL HIGH (ref 70–99)
Glucose-Capillary: 291 mg/dL — ABNORMAL HIGH (ref 70–99)
Glucose-Capillary: 291 mg/dL — ABNORMAL HIGH (ref 70–99)
Glucose-Capillary: 295 mg/dL — ABNORMAL HIGH (ref 70–99)

## 2023-06-30 LAB — CBC
HCT: 44.9 % (ref 39.0–52.0)
Hemoglobin: 15.3 g/dL (ref 13.0–17.0)
MCH: 28 pg (ref 26.0–34.0)
MCHC: 34.1 g/dL (ref 30.0–36.0)
MCV: 82.2 fL (ref 80.0–100.0)
Platelets: 188 10*3/uL (ref 150–400)
RBC: 5.46 MIL/uL (ref 4.22–5.81)
RDW: 14.7 % (ref 11.5–15.5)
WBC: 9.4 10*3/uL (ref 4.0–10.5)
nRBC: 0 % (ref 0.0–0.2)

## 2023-06-30 SURGERY — DIALYSIS/PERMA CATHETER INSERTION
Anesthesia: Moderate Sedation

## 2023-06-30 MED ORDER — INSULIN GLARGINE-YFGN 100 UNIT/ML ~~LOC~~ SOLN
36.0000 [IU] | Freq: Every day | SUBCUTANEOUS | Status: DC
  Administered 2023-06-30: 36 [IU] via SUBCUTANEOUS
  Filled 2023-06-30: qty 0.36

## 2023-06-30 MED ORDER — DICLOFENAC SODIUM 1 % EX GEL
4.0000 g | Freq: Four times a day (QID) | CUTANEOUS | Status: DC
  Administered 2023-06-30 – 2023-07-01 (×4): 4 g via TOPICAL
  Filled 2023-06-30: qty 100

## 2023-06-30 MED ORDER — PNEUMOCOCCAL 20-VAL CONJ VACC 0.5 ML IM SUSY
0.5000 mL | PREFILLED_SYRINGE | INTRAMUSCULAR | Status: AC
  Administered 2023-07-01: 0.5 mL via INTRAMUSCULAR
  Filled 2023-06-30: qty 0.5

## 2023-06-30 MED ORDER — INFLUENZA VAC A&B SURF ANT ADJ 0.5 ML IM SUSY
0.5000 mL | PREFILLED_SYRINGE | INTRAMUSCULAR | Status: AC
  Administered 2023-07-01: 0.5 mL via INTRAMUSCULAR
  Filled 2023-06-30 (×2): qty 0.5

## 2023-06-30 NOTE — Inpatient Diabetes Management (Signed)
Inpatient Diabetes Program Recommendations  AACE/ADA: New Consensus Statement on Inpatient Glycemic Control   Target Ranges:  Prepandial:   less than 140 mg/dL      Peak postprandial:   less than 180 mg/dL (1-2 hours)      Critically ill patients:  140 - 180 mg/dL    Latest Reference Range & Units 06/29/23 08:18 06/29/23 12:05 06/29/23 16:05 06/29/23 21:28 06/30/23 08:02  Glucose-Capillary 70 - 99 mg/dL 762 (H) 831 (H) 517 (H) 234 (H) 263 (H)   Review of Glycemic Control  Diabetes history: DM2 Outpatient Diabetes medications: Lantus 40 units at bedtime, Humalog 13 units TID, Trulicity 0.75 mg Qweek Current orders for Inpatient glycemic control: Semglee 36 units at bedtime, Novolog 0-9 units TID with meals, Novolog 0-5 units QHS  Inpatient Diabetes Program Recommendations:    Insulin: Noted Semglee increased to 36 units at bedtime today. If post prandial glucose continues to be consistently over 180 mg/dl, please consider ordering Novolog 3 units TID with meals for meal coverage if patient eats at least 50% of meals.  Thanks, Orlando Penner, RN, MSN, CDCES Diabetes Coordinator Inpatient Diabetes Program [phone] (Team Pager from 8am to 5pm)

## 2023-06-30 NOTE — Progress Notes (Signed)
Physical Therapy Treatment Patient Details Name: Austin [name] MRN: [id number] DOB: 29-Sep-1956 Today's Date: 06/30/2023   History of Present Illness Damario Wyffels is a 67 y.o. male with medical history significant of PVD (s/p of right common femoral to TP trunk bypass with vein on 02/25/2022), HTN, HLD,m DM, CAD, dCHF, A fib on Eliquis, s/p of left BKA, obesity, former smoker, who presents with right leg pain. Pt underwent  successful recannulation right lower extremity for limb salvage.    PT Comments  Patient agreeable to mobility, reported 8/10 R leg pain. Pt able to don prosthetic with set up assist. Sit <> stand with RW and maxAx2, able to come up into standing with 2nd and 3rd attempt. Unable to truly take a step or maintain standing >20 seconds. Lateral scoot to recliner with supervision. The patient would benefit from further skilled PT intervention to continue to progress towards goals.      If plan is discharge home, recommend the following: A lot of help with walking and/or transfers;A lot of help with bathing/dressing/bathroom;Assist for transportation;Help with stairs or ramp for entrance;Assistance with cooking/housework   Can travel by private vehicle     No  Equipment Recommendations  Other (comment) (TBD)    Recommendations for Other Services       Precautions / Restrictions Precautions Precautions: Fall Restrictions Weight Bearing Restrictions: No Other Position/Activity Restrictions: Hx, L BKA has prosthetic in room.     Mobility  Bed Mobility Overal bed mobility: Modified Independent                  Transfers Overall transfer level: Needs assistance Equipment used: Rolling walker (2 wheels) Transfers: Sit to/from Stand, Bed to chair/wheelchair/BSC Sit to Stand: Max assist, From elevated surface, +2 physical assistance          Lateral/Scoot Transfers: Supervision General transfer comment: three sit <> stands pt able to come up into standing  with last two attempts    Ambulation/Gait                   Stairs             Wheelchair Mobility     Tilt Bed    Modified Rankin (Stroke Patients Only)       Balance Overall balance assessment: Needs assistance Sitting-balance support: Bilateral upper extremity supported, No upper extremity supported Sitting balance-Leahy Scale: Normal Sitting balance - Comments: steady with dynamic sitting balance while seated in recliner. Able to reach outside BOS.   Standing balance support: Bilateral upper extremity supported, During functional activity, Reliant on assistive device for balance Standing balance-Leahy Scale: Poor Standing balance comment: limited hip and knee extension in standing. Significant UE support needed on RW to stabilize himself.                            Cognition Arousal: Alert Behavior During Therapy: WFL for tasks assessed/performed Overall Cognitive Status: Within Functional Limits for tasks assessed                                 General Comments: Pt is pleasant, cooperative, eager to particiapte in therapy session.        Exercises Other Exercises Other Exercises: pt reviewed R calf stretch, able to perform with belt from home    General Comments        Pertinent Vitals/Pain Pain  Assessment Pain Assessment: 0-10 Pain Score: 8  Pain Location: RLE Pain Descriptors / Indicators: Aching, Cramping, Discomfort, Grimacing Pain Intervention(s): Limited activity within patient's tolerance, Monitored during session, Repositioned    Home Living                          Prior Function            PT Goals (current goals can now be found in the care plan section) Progress towards PT goals: Progressing toward goals    Frequency    Min 1X/week      PT Plan      Co-evaluation              AM-PAC PT "6 Clicks" Mobility   Outcome Measure  Help needed turning from your back to your  side while in a flat bed without using bedrails?: A Little Help needed moving from lying on your back to sitting on the side of a flat bed without using bedrails?: A Little Help needed moving to and from a bed to a chair (including a wheelchair)?: A Lot Help needed standing up from a chair using your arms (e.g., wheelchair or bedside chair)?: A Lot Help needed to walk in hospital room?: Total Help needed climbing 3-5 steps with a railing? : Total 6 Click Score: 12    End of Session Equipment Utilized During Treatment: Gait belt Activity Tolerance: Patient tolerated treatment well;Patient limited by pain;Patient limited by fatigue Patient left: in chair;with call bell/phone within reach;with chair alarm set;with nursing/sitter in room Nurse Communication: Mobility status PT Visit Diagnosis: Other abnormalities of gait and mobility (R26.89);Unsteadiness on feet (R26.81);Pain;Muscle weakness (generalized) (M62.81) Pain - Right/Left: Right Pain - part of body: Leg     Time: 7253-6644 PT Time Calculation (min) (ACUTE ONLY): 23 min  Charges:    $Therapeutic Exercise: 8-22 mins $Therapeutic Activity: 8-22 mins PT General Charges $$ ACUTE PT VISIT: 1 Visit                     Olga Coaster PT, DPT 12:47 PM,06/30/23

## 2023-06-30 NOTE — Plan of Care (Signed)
  Problem: Education: Goal: Ability to describe self-care measures that may prevent or decrease complications (Diabetes Survival Skills Education) will improve Outcome: Progressing Goal: Individualized Educational Video(s) Outcome: Progressing   Problem: Coping: Goal: Ability to adjust to condition or change in health will improve Outcome: Progressing   Problem: Fluid Volume: Goal: Ability to maintain a balanced intake and output will improve Outcome: Progressing   Problem: Health Behavior/Discharge Planning: Goal: Ability to identify and utilize available resources and services will improve Outcome: Progressing Goal: Ability to manage health-related needs will improve Outcome: Progressing   Problem: Metabolic: Goal: Ability to maintain appropriate glucose levels will improve Outcome: Progressing   Problem: Nutritional: Goal: Maintenance of adequate nutrition will improve Outcome: Progressing Goal: Progress toward achieving an optimal weight will improve Outcome: Progressing   Problem: Skin Integrity: Goal: Risk for impaired skin integrity will decrease Outcome: Progressing   Problem: Tissue Perfusion: Goal: Adequacy of tissue perfusion will improve Outcome: Progressing   Problem: Education: Goal: Knowledge of General Education information will improve Description: Including pain rating scale, medication(s)/side effects and non-pharmacologic comfort measures Outcome: Progressing   Problem: Clinical Measurements: Goal: Ability to maintain clinical measurements within normal limits will improve Outcome: Progressing Goal: Will remain free from infection Outcome: Progressing Goal: Diagnostic test results will improve Outcome: Progressing Goal: Respiratory complications will improve Outcome: Progressing Goal: Cardiovascular complication will be avoided Outcome: Progressing   Problem: Activity: Goal: Risk for activity intolerance will decrease Outcome: Progressing    Problem: Nutrition: Goal: Adequate nutrition will be maintained Outcome: Progressing   Problem: Coping: Goal: Level of anxiety will decrease Outcome: Progressing   Problem: Elimination: Goal: Will not experience complications related to bowel motility Outcome: Progressing Goal: Will not experience complications related to urinary retention Outcome: Progressing   Problem: Pain Managment: Goal: General experience of comfort will improve Outcome: Progressing   Problem: Safety: Goal: Ability to remain free from injury will improve Outcome: Progressing   Problem: Skin Integrity: Goal: Risk for impaired skin integrity will decrease Outcome: Progressing   Problem: Education: Goal: Understanding of CV disease, CV risk reduction, and recovery process will improve Outcome: Progressing Goal: Individualized Educational Video(s) Outcome: Progressing   Problem: Activity: Goal: Ability to return to baseline activity level will improve Outcome: Progressing   Problem: Cardiovascular: Goal: Ability to achieve and maintain adequate cardiovascular perfusion will improve Outcome: Progressing Goal: Vascular access site(s) Level 0-1 will be maintained Outcome: Progressing

## 2023-06-30 NOTE — TOC Progression Note (Signed)
Transition of Care [facility]) - Progression Note    Patient Details  Name: Austin [name] MRN: [id number] Date of Birth: [birth year]  Transition of Care [facility]) CM/SW Contact  Darolyn Rua, [place] Phone Number: 06/30/2023, 12:59 PM  Clinical Narrative:     Insurance auth started for [facility], Kenney Houseman at [facility] informed. Pending insurance auth at this time.        Expected Discharge Plan and Services                                               Social Determinants of Health (SDOH) Interventions SDOH Screenings   Food Insecurity: No Food Insecurity (06/25/2023)  Housing: Low Risk  (06/25/2023)  Transportation Needs: No Transportation Needs (06/25/2023)  Utilities: Not At Risk (06/25/2023)  Depression (PHQ2-9): Low Risk  (06/25/2022)  Tobacco Use: Medium Risk (01/16/2023)    Readmission Risk Interventions     No data to display

## 2023-06-30 NOTE — Plan of Care (Signed)
Problem: Education: Goal: Ability to describe self-care measures that may prevent or decrease complications (Diabetes Survival Skills Education) will improve 06/30/2023 1614 by Murriel Hopper, Donald Pore, RN Outcome: Progressing 06/30/2023 1438 by Murriel Hopper, Donald Pore, RN Outcome: Progressing Goal: Individualized Educational Video(s) 06/30/2023 1614 by Murriel Hopper, Donald Pore, RN Outcome: Progressing 06/30/2023 1438 by Murriel Hopper, Donald Pore, RN Outcome: Progressing   Problem: Coping: Goal: Ability to adjust to condition or change in health will improve 06/30/2023 1614 by Murriel Hopper, Donald Pore, RN Outcome: Progressing 06/30/2023 1438 by Murriel Hopper, Donald Pore, RN Outcome: Progressing   Problem: Fluid Volume: Goal: Ability to maintain a balanced intake and output will improve 06/30/2023 1614 by Murriel Hopper, Donald Pore, RN Outcome: Progressing 06/30/2023 1438 by Murriel Hopper, Donald Pore, RN Outcome: Progressing   Problem: Health Behavior/Discharge Planning: Goal: Ability to identify and utilize available resources and services will improve 06/30/2023 1614 by Murriel Hopper, Donald Pore, RN Outcome: Progressing 06/30/2023 1438 by Murriel Hopper, Donald Pore, RN Outcome: Progressing Goal: Ability to manage health-related needs will improve 06/30/2023 1614 by Murriel Hopper, Donald Pore, RN Outcome: Progressing 06/30/2023 1438 by Murriel Hopper, Donald Pore, RN Outcome: Progressing   Problem: Metabolic: Goal: Ability to maintain appropriate glucose levels will improve 06/30/2023 1614 by Murriel Hopper, Donald Pore, RN Outcome: Progressing 06/30/2023 1438 by Murriel Hopper, Donald Pore, RN Outcome: Progressing   Problem: Nutritional: Goal: Maintenance of adequate nutrition will improve 06/30/2023 1614 by Murriel Hopper, Donald Pore, RN Outcome: Progressing 06/30/2023 1438 by Murriel Hopper, Donald Pore, RN Outcome: Progressing Goal: Progress toward achieving an optimal weight will improve 06/30/2023 1614 by  Murriel Hopper, Donald Pore, RN Outcome: Progressing 06/30/2023 1438 by Murriel Hopper, Donald Pore, RN Outcome: Progressing   Problem: Skin Integrity: Goal: Risk for impaired skin integrity will decrease 06/30/2023 1614 by Murriel Hopper, Donald Pore, RN Outcome: Progressing 06/30/2023 1438 by Murriel Hopper, Donald Pore, RN Outcome: Progressing   Problem: Tissue Perfusion: Goal: Adequacy of tissue perfusion will improve 06/30/2023 1614 by Monica Becton, RN Outcome: Progressing 06/30/2023 1438 by Murriel Hopper, Donald Pore, RN Outcome: Progressing   Problem: Education: Goal: Knowledge of General Education information will improve Description: Including pain rating scale, medication(s)/side effects and non-pharmacologic comfort measures 06/30/2023 1614 by Monica Becton, RN Outcome: Progressing 06/30/2023 1438 by Murriel Hopper, Donald Pore, RN Outcome: Progressing   Problem: Health Behavior/Discharge Planning: Goal: Ability to manage health-related needs will improve 06/30/2023 1614 by Murriel Hopper, Donald Pore, RN Outcome: Progressing 06/30/2023 1438 by Murriel Hopper, Donald Pore, RN Outcome: Progressing   Problem: Clinical Measurements: Goal: Ability to maintain clinical measurements within normal limits will improve 06/30/2023 1614 by Murriel Hopper, Donald Pore, RN Outcome: Progressing 06/30/2023 1438 by Murriel Hopper, Donald Pore, RN Outcome: Progressing Goal: Will remain free from infection 06/30/2023 1614 by Murriel Hopper, Donald Pore, RN Outcome: Progressing 06/30/2023 1438 by Murriel Hopper, Donald Pore, RN Outcome: Progressing Goal: Diagnostic test results will improve 06/30/2023 1614 by Monica Becton, RN Outcome: Progressing 06/30/2023 1438 by Murriel Hopper, Donald Pore, RN Outcome: Progressing Goal: Respiratory complications will improve 06/30/2023 1614 by Monica Becton, RN Outcome: Progressing 06/30/2023 1438 by Murriel Hopper, Donald Pore, RN Outcome: Progressing Goal:  Cardiovascular complication will be avoided 06/30/2023 1614 by Monica Becton, RN Outcome: Progressing 06/30/2023 1438 by Monica Becton, RN Outcome: Progressing   Problem: Activity: Goal: Risk for activity intolerance will decrease 06/30/2023 1614 by Murriel Hopper, Donald Pore, RN Outcome: Progressing 06/30/2023 1438 by Murriel Hopper, Donald Pore, RN Outcome: Progressing   Problem: Nutrition: Goal: Adequate nutrition will be maintained 06/30/2023 1614 by Murriel Hopper, Donald Pore, RN Outcome: Progressing 06/30/2023 1438 by Murriel Hopper, Donald Pore, RN Outcome: Progressing  Problem: Coping: Goal: Level of anxiety will decrease 06/30/2023 1614 by Murriel Hopper, Donald Pore, RN Outcome: Progressing 06/30/2023 1438 by Murriel Hopper, Donald Pore, RN Outcome: Progressing   Problem: Elimination: Goal: Will not experience complications related to bowel motility 06/30/2023 1614 by Murriel Hopper, Donald Pore, RN Outcome: Progressing 06/30/2023 1438 by Murriel Hopper, Donald Pore, RN Outcome: Progressing Goal: Will not experience complications related to urinary retention 06/30/2023 1614 by Monica Becton, RN Outcome: Progressing 06/30/2023 1438 by Murriel Hopper, Donald Pore, RN Outcome: Progressing   Problem: Pain Managment: Goal: General experience of comfort will improve 06/30/2023 1614 by Murriel Hopper, Donald Pore, RN Outcome: Progressing 06/30/2023 1438 by Murriel Hopper, Donald Pore, RN Outcome: Progressing   Problem: Safety: Goal: Ability to remain free from injury will improve 06/30/2023 1614 by Murriel Hopper, Donald Pore, RN Outcome: Progressing 06/30/2023 1438 by Murriel Hopper, Donald Pore, RN Outcome: Progressing   Problem: Skin Integrity: Goal: Risk for impaired skin integrity will decrease 06/30/2023 1614 by Monica Becton, RN Outcome: Progressing 06/30/2023 1438 by Murriel Hopper, Donald Pore, RN Outcome: Progressing   Problem: Education: Goal: Understanding of CV disease,  CV risk reduction, and recovery process will improve 06/30/2023 1614 by Monica Becton, RN Outcome: Progressing 06/30/2023 1438 by Monica Becton, RN Outcome: Progressing Goal: Individualized Educational Video(s) 06/30/2023 1614 by Monica Becton, RN Outcome: Progressing 06/30/2023 1438 by Monica Becton, RN Outcome: Progressing   Problem: Activity: Goal: Ability to return to baseline activity level will improve 06/30/2023 1614 by Murriel Hopper, Donald Pore, RN Outcome: Progressing 06/30/2023 1438 by Murriel Hopper, Donald Pore, RN Outcome: Progressing   Problem: Cardiovascular: Goal: Ability to achieve and maintain adequate cardiovascular perfusion will improve 06/30/2023 1614 by Monica Becton, RN Outcome: Progressing 06/30/2023 1438 by Murriel Hopper, Donald Pore, RN Outcome: Progressing Goal: Vascular access site(s) Level 0-1 will be maintained 06/30/2023 1614 by Monica Becton, RN Outcome: Progressing 06/30/2023 1438 by Monica Becton, RN Outcome: Progressing

## 2023-06-30 NOTE — Care Management Important Message (Signed)
Important Message  Patient Details  Name: SCHNEIDER GAVINS MRN: [id number] Date of Birth: 1956/03/17   Medicare Important Message Given:  Yes     [name] 06/30/2023, 10:34 AM

## 2023-06-30 NOTE — Progress Notes (Signed)
PROGRESS NOTE    Austin [name]  HQI:[id number] DOB: Jan 05, 1956 DOA: 06/25/2023 PCP: Preston Fleeting, MD    Brief Narrative:  67 y.o. male with medical history significant of PVD (s/p of right common femoral to TP trunk bypass with vein on 02/25/2022), HTN, HLD,m DM, CAD, dCHF, A fib on Eliquis, s/p of left BKA, obesity, former smoker, who presents with right leg pain.    Pt states that his right leg pain started this afternoon, which is mainly located in the right lower leg, but also involving the whole right leg.  The pain is constant, throbbing, severe, nonradiating.  Not aggravated or alleviated by any known factors.  No injury. He state that his foot feels and lower leg are cool to touch. Patient does not have chest pain, cough or SOB. No nausea, vomiting, diarrhea or abdominal pain.  Denies symptoms of UTI.  No fever or chills. Pt has does not have palpable pulse in his right foot but does have good biphasic Doppler signal in ED.   9/19: Taken emergently to Cath Lab with vascular surgery.  Successful recannulation right lower extremity for limb salvage.  Patient tolerated procedure well.  Returned to medical floor.  9/20: Worsening pain in right lower extremity 9/21: No acute events 9/23: No acute events.  Patient remains medically ready for discharge.   Assessment & Plan:   Principal Problem:   Critical limb ischemia of right lower extremity (HCC) Active Problems:   Peripheral vascular disease (HCC)   Essential hypertension   Hyperlipidemia   Coronary artery disease   Type 2 diabetes mellitus with diabetic peripheral angiopathy without gangrene (HCC)   Atrial fibrillation, chronic (HCC)   DVT (deep venous thrombosis) (HCC)   Obesity (BMI 30-39.9)   Right leg pain  Critical limb ischemia of right lower extremity and hx of peripheral vascular disease: pt is s/p of right common femoral to TP trunk bypass with vein on 02/25/2022. Now has worsening right leg pain,  clinically consistent with ischemia limb. EDP consulted [facility] NP, Sheppard Plumber who is with Dr. Gilda Crease of [facility]. -Status post successful recanalization of right lower extremity for limb salvage Plan: Diet as tolerated Multimodal pain control.  DC IV pain medication Dual antiplatelet therapy Need skilled nursing facility Medically ready for discharge  Essential hypertension Continue PTA Cozaar.   IV hydralazine as needed  Hyperlipidemia PTA Lipitor   Coronary artery disease PTA statin   Type 2 diabetes mellitus with diabetic peripheral angiopathy without gangrene [facility]):  Recent A1c 11.3, poorly controlled.   Patient said taking Trulicity, Humalog, Lantus 41 unit daily Plan: -Increase Semglee to 36 units nightly -Sliding scale insulin -Carb modified diet   Atrial fibrillation, chronic (HCC): Heart rate 83 Eliquis   DVT (deep venous thrombosis) (HCC) Course   Obesity (BMI 30-39.9): Body weight 113.4 kg, BMI 33.91   DVT prophylaxis: Heparin Code Status: Full Family Communication: None today Disposition Plan: Status is: Inpatient Remains inpatient appropriate because: Lower extremity peripheral arterial disease   Level of care: Telemetry Medical  Consultants:  Vascular surgery  Procedures:  Lower extremity angiography  Antimicrobials: None   Subjective: Seen and examined.  No acute overnight events.  No new complaints  Objective: Vitals:   06/29/23 1608 06/29/23 2019 06/30/23 0414 06/30/23 0817  BP: 139/66 (!) 141/69 134/73 139/73  Pulse: (!) 102 89 66 100  Resp: 20 18 14 17   Temp: 98 F (36.7 C) 97.7 F (36.5 C) 98.2 F (36.8 C) 97.7 F (36.5  C)  TempSrc:   Oral Oral  SpO2: 99% 98% 100% 100%  Weight:      Height:        Intake/Output Summary (Last 24 hours) at 06/30/2023 1147 Last data filed at 06/29/2023 2028 Gross per 24 hour  Intake 360 ml  Output 400 ml  Net -40 ml   Filed Weights   06/25/23 1601  Weight: 113.4 kg     Examination:  General exam: NAD Respiratory system: Lungs clear.  Normal work of breathing.  Room air Cardiovascular system: S/2, RRR, no murmurs, no pedal edema Gastrointestinal system: Obese soft, NT/ND, bowel sounds Central nervous system: Alert and oriented. No focal neurological deficits. Extremities: Left lower extremity AKA.  Right lower extremity decreased pulses Skin: No rashes, lesions or ulcers Psychiatry: Judgement and insight appear normal. Mood & affect appropriate.     Data Reviewed: I have personally reviewed following labs and imaging studies  CBC: Recent Labs  Lab 06/25/23 1610 06/26/23 0417 06/30/23 0429  WBC 9.6 10.1 9.4  NEUTROABS 6.3  --   --   HGB 16.1 13.7 15.3  HCT 49.6 41.0 44.9  MCV 85.7 83.7 82.2  PLT 209 204 188   Basic Metabolic Panel: Recent Labs  Lab 06/25/23 1610 06/26/23 0417 06/30/23 1057  NA 137 138 138  K 4.5 4.1 4.5  CL 106 106 104  CO2 22 25 25   GLUCOSE 132* 182* 289*  BUN 14 13 18   CREATININE 0.88 0.78 0.82  CALCIUM 9.6 8.9 9.5   GFR: Estimated Creatinine Clearance: 113.6 mL/min (by C-G formula based on SCr of 0.82 mg/dL). Liver Function Tests: Recent Labs  Lab 06/25/23 1610  AST 26  ALT 20  ALKPHOS 87  BILITOT 1.2  PROT 8.1  ALBUMIN 4.2   No results for input(s): "LIPASE", "AMYLASE" in the last 168 hours. No results for input(s): "AMMONIA" in the last 168 hours. Coagulation Profile: Recent Labs  Lab 06/25/23 1812  INR 1.2   Cardiac Enzymes: No results for input(s): "CKTOTAL", "CKMB", "CKMBINDEX", "TROPONINI" in the last 168 hours. BNP (last 3 results) No results for input(s): "PROBNP" in the last 8760 hours. HbA1C: No results for input(s): "HGBA1C" in the last 72 hours. CBG: Recent Labs  Lab 06/29/23 0818 06/29/23 1205 06/29/23 1605 06/29/23 2128 06/30/23 0802  GLUCAP 179* 290* 248* 234* 263*   Lipid Profile: No results for input(s): "CHOL", "HDL", "LDLCALC", "TRIG", "CHOLHDL", "LDLDIRECT"  in the last 72 hours. Thyroid Function Tests: No results for input(s): "TSH", "T4TOTAL", "FREET4", "T3FREE", "THYROIDAB" in the last 72 hours. Anemia Panel: No results for input(s): "VITAMINB12", "FOLATE", "FERRITIN", "TIBC", "IRON", "RETICCTPCT" in the last 72 hours. Sepsis Labs: No results for input(s): "PROCALCITON", "LATICACIDVEN" in the last 168 hours.  No results found for this or any previous visit (from the past 240 hour(s)).       Radiology Studies: No results found.      Scheduled Meds:  acidophilus  1 capsule Oral QPC supper   apixaban  2.5 mg Oral BID   ascorbic acid  500 mg Oral BID WC   aspirin EC  81 mg Oral Daily   atorvastatin  80 mg Oral q1800   cholecalciferol  5,000 Units Oral Daily   clopidogrel  75 mg Oral Q breakfast   gabapentin  300 mg Oral TID   insulin aspart  0-5 Units Subcutaneous QHS   insulin aspart  0-9 Units Subcutaneous TID WC   insulin glargine-yfgn  33 Units Subcutaneous QHS  losartan  25 mg Oral Daily   methocarbamol  500 mg Oral QID   nicotine  21 mg Transdermal Daily   polyethylene glycol  17 g Oral Daily   senna-docusate  1 tablet Oral BID   sodium chloride flush  3 mL Intravenous Q12H   zinc sulfate  220 mg Oral QPC supper   Continuous Infusions:  sodium chloride       LOS: 5 days      Tresa Moore, MD [facility]   If 7PM-7AM, please contact night-coverage  06/30/2023, 11:47 AM

## 2023-07-01 ENCOUNTER — Encounter: Payer: Self-pay | Admitting: Internal Medicine

## 2023-07-01 DIAGNOSIS — I70221 Atherosclerosis of native arteries of extremities with rest pain, right leg: Secondary | ICD-10-CM | POA: Diagnosis not present

## 2023-07-01 LAB — GLUCOSE, CAPILLARY
Glucose-Capillary: 247 mg/dL — ABNORMAL HIGH (ref 70–99)
Glucose-Capillary: 313 mg/dL — ABNORMAL HIGH (ref 70–99)

## 2023-07-01 MED ORDER — INSULIN GLARGINE-YFGN 100 UNIT/ML ~~LOC~~ SOLN
40.0000 [IU] | Freq: Every day | SUBCUTANEOUS | Status: DC
  Filled 2023-07-01: qty 0.4

## 2023-07-01 MED ORDER — INSULIN ASPART 100 UNIT/ML IJ SOLN
4.0000 [IU] | Freq: Three times a day (TID) | INTRAMUSCULAR | Status: DC
  Administered 2023-07-01: 4 [IU] via SUBCUTANEOUS
  Filled 2023-07-01: qty 1

## 2023-07-01 MED ORDER — GABAPENTIN 300 MG PO CAPS: 300.0000 mg | ORAL_CAPSULE | Freq: Three times a day (TID) | ORAL | Status: DC

## 2023-07-01 MED ORDER — DICLOFENAC SODIUM 1 % EX GEL: 4.0000 g | Freq: Four times a day (QID) | CUTANEOUS | Status: DC

## 2023-07-01 MED ORDER — OXYCODONE HCL 5 MG PO TABS: 5.0000 mg | ORAL_TABLET | ORAL | 0 refills | Status: AC | PRN

## 2023-07-01 MED ORDER — APIXABAN 2.5 MG PO TABS: 2.5000 mg | ORAL_TABLET | Freq: Two times a day (BID) | ORAL | Status: DC

## 2023-07-01 MED ORDER — SENNOSIDES-DOCUSATE SODIUM 8.6-50 MG PO TABS: 1.0000 | ORAL_TABLET | Freq: Two times a day (BID) | ORAL | Status: DC

## 2023-07-01 NOTE — TOC Progression Note (Signed)
Transition of Care [facility]) - Progression Note    Patient Details  Name: Austin [name] MRN: [id number] Date of Birth: 08-Mar-1956  [facility]) CM/SW Contact  Chapman Fitch, RN Phone Number: 07/01/2023, 10:14 AM  Clinical Narrative:        Received insurance auth for SNF.  MD notified     Expected Discharge Plan and Services                                               Social Determinants of Health (SDOH) Interventions SDOH Screenings   Food Insecurity: No Food Insecurity (06/25/2023)  Housing: Low Risk  (06/25/2023)  Transportation Needs: No Transportation Needs (06/25/2023)  Utilities: Not At Risk (06/25/2023)  Depression (PHQ2-9): Low Risk  (06/25/2022)  Tobacco Use: Medium Risk (07/01/2023)    Readmission Risk Interventions     No data to display

## 2023-07-01 NOTE — TOC Transition Note (Signed)
Transition of Care [facility]) - CM/SW Discharge Note   Patient Details  Name: Austin [name] MRN: [id number] Date of Birth: [birth year]  Transition of Care [facility]) CM/SW Contact:  Chapman Fitch, RN Phone Number: 07/01/2023, 1:27 PM   Clinical Narrative:        Patient will DC to: [facility]  Anticipated DC date: 07/01/23  Family notified: America Brown per patient's request  Transport by: Wendie Simmer  Per MD patient ready for DC to . RN, patient, patient's family, and facility notified of DC. Discharge Summary sent to facility. RN given number for report. DC packet on chart. Ambulance transport requested for patient.   Bedside RN confirmed signed script on chart TOC signing off.      Patient Goals and CMS Choice      Discharge Placement                         Discharge Plan and Services Additional resources added to the After Visit Summary for                                       Social Determinants of Health (SDOH) Interventions SDOH Screenings   Food Insecurity: No Food Insecurity (06/25/2023)  Housing: Low Risk  (06/25/2023)  Transportation Needs: No Transportation Needs (06/25/2023)  Utilities: Not At Risk (06/25/2023)  Depression (PHQ2-9): Low Risk  (06/25/2022)  Tobacco Use: Medium Risk (07/01/2023)     Readmission Risk Interventions     No data to display

## 2023-07-01 NOTE — Plan of Care (Signed)
The patient has been discharged. IV has been removed. The patient has been discharged to [facility] care. Report called to nurse Crystal. Problem: Education: Goal: Ability to describe self-care measures that may prevent or decrease complications (Diabetes Survival Skills Education) will improve Outcome: Not Applicable Goal: Individualized Educational Video(s) Outcome: Not Applicable   Problem: Coping: Goal: Ability to adjust to condition or change in health will improve Outcome: Not Applicable   Problem: Fluid Volume: Goal: Ability to maintain a balanced intake and output will improve Outcome: Not Applicable   Problem: Health Behavior/Discharge Planning: Goal: Ability to identify and utilize available resources and services will improve Outcome: Not Applicable Goal: Ability to manage health-related needs will improve Outcome: Not Applicable   Problem: Metabolic: Goal: Ability to maintain appropriate glucose levels will improve Outcome: Not Applicable   Problem: Nutritional: Goal: Maintenance of adequate nutrition will improve Outcome: Not Applicable Goal: Progress toward achieving an optimal weight will improve Outcome: Not Applicable   Problem: Skin Integrity: Goal: Risk for impaired skin integrity will decrease Outcome: Not Applicable   Problem: Tissue Perfusion: Goal: Adequacy of tissue perfusion will improve Outcome: Not Applicable   Problem: Education: Goal: Knowledge of General Education information will improve Description: Including pain rating scale, medication(s)/side effects and non-pharmacologic comfort measures Outcome: Not Applicable

## 2023-07-01 NOTE — Inpatient Diabetes Management (Signed)
Inpatient Diabetes Program Recommendations  AACE/ADA: New Consensus Statement on Inpatient Glycemic Control  Target Ranges:  Prepandial:   less than 140 mg/dL      Peak postprandial:   less than 180 mg/dL (1-2 hours)      Critically ill patients:  140 - 180 mg/dL    Latest Reference Range & Units 06/30/23 08:02 06/30/23 12:25 06/30/23 17:29 06/30/23 21:34 07/01/23 07:38  Glucose-Capillary 70 - 99 mg/dL 409 (H) 811 (H) 914 (H) 295 (H) 247 (H)   Review of Glycemic Control  Diabetes history: DM2 Outpatient Diabetes medications: Lantus 40 units at bedtime, Humalog 13 units TID, Trulicity 0.75 mg Qweek Current orders for Inpatient glycemic control: Semglee 36 units at bedtime, Novolog 0-9 units TID with meals, Novolog 0-5 units QHS   Inpatient Diabetes Program Recommendations:     Insulin: CBGs ranged from 263-295 mg/dl on 7/82/95 and fasting CBG 247 mg/dl today.  Please consider increasing Semglee to 40 units at bedtime and ordering Novolog 4 units TID with meals for meal coverage if patient eats at least 50% of meals.   Thanks, Orlando Penner, RN, MSN, CDCES Diabetes Coordinator Inpatient Diabetes Program [phone] (Team Pager from 8am to 5pm)

## 2023-07-01 NOTE — Progress Notes (Signed)
Mobility Specialist - Progress Note   07/01/23 1100  Mobility  Activity Stood at bedside;Transferred from bed to chair  Level of Assistance Moderate assist, patient does 50-74%  Assistive Device Front wheel walker  Range of Motion/Exercises Active Assistive;Right leg;Left leg  Activity Response Tolerated well  $Mobility charge 1 Mobility     Pt lying in bed upon arrival, utilizing RA. Pt agreeable to activity. Completed bed mobility modI. Participated in active-assist therex while EOB to prep for standing. STS x3 from elevated bed height with min-modA +2. Does voice pain in RLE. Inability to fully extend RLE or march in place. Lateral scoot transfer to recliner. Once seated, pt voiced burning sensation in RLE. RN notified. Pt left in chair with alarm set, needs in reach.    Filiberto Pinks Mobility Specialist 07/01/23, 11:36 AM

## 2023-07-01 NOTE — Discharge Summary (Signed)
Physician Discharge Summary  Austin [name] FAO:[id number] DOB: 06-19-1956 DOA: 06/25/2023  PCP: Preston Fleeting, MD  Admit date: 06/25/2023 Discharge date: 07/01/2023  Admitted From: Home Disposition:  SNF  Recommendations for Outpatient Follow-up:  Follow up with PCP in 1-2 weeks Follow up with vascular 2 weeks  Home Health:No Equipment/Devices:None   Discharge Condition:Stable  CODE STATUS:FULL  Diet recommendation: Carb  Brief/Interim Summary:  67 y.o. male with medical history significant of PVD (s/p of right common femoral to TP trunk bypass with vein on 02/25/2022), HTN, HLD,m DM, CAD, dCHF, A fib on Eliquis, s/p of left BKA, obesity, former smoker, who presents with right leg pain.    Pt states that his right leg pain started this afternoon, which is mainly located in the right lower leg, but also involving the whole right leg.  The pain is constant, throbbing, severe, nonradiating.  Not aggravated or alleviated by any known factors.  No injury. He state that his foot feels and lower leg are cool to touch. Patient does not have chest pain, cough or SOB. No nausea, vomiting, diarrhea or abdominal pain.  Denies symptoms of UTI.  No fever or chills. Pt has does not have palpable pulse in his right foot but does have good biphasic Doppler signal in ED.    9/19: Taken emergently to Cath Lab with vascular surgery.  Successful recannulation right lower extremity for limb salvage.  Patient tolerated procedure well.  Returned to medical floor.   9/20: Worsening pain in right lower extremity 9/21: No acute events 9/23: No acute events.  Patient remains medically ready for discharge. 9/24: Auth obtained, stable for dc to SNF     Discharge Diagnoses:  Principal Problem:   Critical limb ischemia of right lower extremity (HCC) Active Problems:   Peripheral vascular disease (HCC)   Essential hypertension   Hyperlipidemia   Coronary artery disease   Type 2 diabetes mellitus  with diabetic peripheral angiopathy without gangrene (HCC)   Atrial fibrillation, chronic (HCC)   DVT (deep venous thrombosis) (HCC)   Obesity (BMI 30-39.9)   Right leg pain Critical limb ischemia of right lower extremity and hx of peripheral vascular disease: pt is s/p of right common femoral to TP trunk bypass with vein on 02/25/2022. Now has worsening right leg pain, clinically consistent with ischemia limb. EDP consulted [facility] NP, Sheppard Plumber who is with Dr. Gilda Crease of [facility]. -Status post successful recanalization of right lower extremity for limb salvage Plan: Stable for discharge to skilled nursing facility.  At time of discharge would recommend Eliquis 2.5 mg twice daily.  Aspirin 81 mg daily.  Plavix and 5 mg daily.  Follow-up with vascular surgery within 2 weeks to determine need for ongoing antiplatelet/anticoagulation.  Multimodal pain regimen.  Appropriate discharge to skilled facility.  Essential hypertension Continue PTA Cozaar.   IV hydralazine as needed   Hyperlipidemia PTA Lipitor   Coronary artery disease PTA statin   Type 2 diabetes mellitus with diabetic peripheral angiopathy without gangrene [facility]):  Recent A1c 11.3, poorly controlled.   Patient said taking Trulicity, Humalog, Lantus 41 unit daily Resume home regimen on discharge   Atrial fibrillation, chronic (HCC): Heart rate 83 Eliquis     Discharge Instructions  Discharge Instructions     Diet - low sodium heart healthy   Complete by: As directed    Increase activity slowly   Complete by: As directed       Allergies as of 07/01/2023   No Known Allergies  Medication List     TAKE these medications    acidophilus Caps capsule Take 1 capsule by mouth daily after supper.   apixaban 2.5 MG Tabs tablet Commonly known as: ELIQUIS Take 1 tablet (2.5 mg total) by mouth 2 (two) times daily. What changed:  medication strength how much to take   ascorbic acid 500 MG tablet Commonly known as:  VITAMIN C Take 1 tablet (500 mg total) by mouth 2 (two) times daily.   aspirin EC 81 MG tablet Take 1 tablet (81 mg total) by mouth daily.   atorvastatin 80 MG tablet Commonly known as: LIPITOR Take 1 tablet (80 mg total) by mouth daily at 6 PM.   clopidogrel 75 MG tablet Commonly known as: PLAVIX Take 1 tablet (75 mg total) by mouth daily at 6 (six) AM.   diclofenac Sodium 1 % Gel Commonly known as: VOLTAREN Apply 4 g topically 4 (four) times daily.   gabapentin 300 MG capsule Commonly known as: NEURONTIN Take 1 capsule (300 mg total) by mouth 3 (three) times daily.   insulin lispro 100 UNIT/ML injection Commonly known as: HUMALOG Inject 13 Units into the skin 3 (three) times daily before meals. What changed: Another medication with the same name was removed. Continue taking this medication, and follow the directions you see here.   Lantus SoloStar 100 UNIT/ML Solostar Pen Generic drug: insulin glargine Inject 41 Units into the skin 2 (two) times daily. What changed:  how much to take when to take this   losartan 25 MG tablet Commonly known as: COZAAR Take 1 tablet (25 mg total) by mouth daily.   magnesium oxide 400 MG tablet Commonly known as: MAG-OX Take 0.5 tablets (200 mg total) by mouth daily.   methocarbamol 500 MG tablet Commonly known as: ROBAXIN Take 1 tablet (500 mg total) by mouth every 6 (six) hours as needed for muscle spasms.   Nicorette Starter Kit 4 MG gum Generic drug: nicotine polacrilex Take 4 mg by mouth as needed for smoking cessation.   oxyCODONE 5 MG immediate release tablet Commonly known as: Oxy IR/ROXICODONE Take 1-2 tablets (5-10 mg total) by mouth every 4 (four) hours as needed for up to 3 days for moderate pain. SNF use only   polyethylene glycol powder 17 GM/SCOOP powder Commonly known as: GLYCOLAX/MIRALAX Take 34 g by mouth daily.   senna-docusate 8.6-50 MG tablet Commonly known as: Senokot-S Take 1 tablet by mouth 2 (two)  times daily.   Trulicity 0.75 MG/0.5ML Sopn Generic drug: Dulaglutide Inject into the skin.   Vitamin D3 125 MCG (5000 UT) Caps Take 1 capsule by mouth daily.   Zinc Sulfate 220 (50 Zn) MG Tabs Take 1 tablet (220 mg total) by mouth daily after supper.               Durable Medical Equipment  (From admission, onward)           Start     Ordered   06/27/23 1440  For home use only DME Walker rolling  Once       Question Answer Comment  Walker: With 5 Inch Wheels   Patient needs a walker to treat with the following condition Weakness generalized      06/27/23 1439            No Known Allergies  Consultations: Vascular surgery   Procedures/Studies: PERIPHERAL VASCULAR CATHETERIZATION  Result Date: 06/26/2023 See surgical note for result.     Subjective: Seen and examined on  the day of discharge.  Stable no distress.  Appropriate for discharge to skilled nursing facility.  Discharge Exam: Vitals:   07/01/23 0303 07/01/23 0814  BP: 134/80 (!) 148/91  Pulse: 76 63  Resp: 16 20  Temp: 97.8 F (36.6 C) 97.7 F (36.5 C)  SpO2: 100% 100%   Vitals:   06/30/23 1523 06/30/23 2042 07/01/23 0303 07/01/23 0814  BP: (!) 127/93 136/87 134/80 (!) 148/91  Pulse: 73 78 76 63  Resp: 18 16 16 20   Temp: (!) 97.5 F (36.4 C) 97.7 F (36.5 C) 97.8 F (36.6 C) 97.7 F (36.5 C)  TempSrc: Oral Oral  Oral  SpO2: 100% 99% 100% 100%  Weight:      Height:        General: Pt is alert, awake, not in acute distress Cardiovascular: RRR, S1/S2 +, no rubs, no gallops Respiratory: CTA bilaterally, no wheezing, no rhonchi Abdominal: Soft, NT, ND, bowel sounds + Extremities: Left lower extremity AKA    The results of significant diagnostics from this hospitalization (including imaging, microbiology, ancillary and laboratory) are listed below for reference.     Microbiology: No results found for this or any previous visit (from the past 240 hour(s)).   Labs: BNP  (last 3 results) No results for input(s): "BNP" in the last 8760 hours. Basic Metabolic Panel: Recent Labs  Lab 06/25/23 1610 06/26/23 0417 06/30/23 1057  NA 137 138 138  K 4.5 4.1 4.5  CL 106 106 104  CO2 22 25 25   GLUCOSE 132* 182* 289*  BUN 14 13 18   CREATININE 0.88 0.78 0.82  CALCIUM 9.6 8.9 9.5   Liver Function Tests: Recent Labs  Lab 06/25/23 1610  AST 26  ALT 20  ALKPHOS 87  BILITOT 1.2  PROT 8.1  ALBUMIN 4.2   No results for input(s): "LIPASE", "AMYLASE" in the last 168 hours. No results for input(s): "AMMONIA" in the last 168 hours. CBC: Recent Labs  Lab 06/25/23 1610 06/26/23 0417 06/30/23 0429  WBC 9.6 10.1 9.4  NEUTROABS 6.3  --   --   HGB 16.1 13.7 15.3  HCT 49.6 41.0 44.9  MCV 85.7 83.7 82.2  PLT 209 204 188   Cardiac Enzymes: No results for input(s): "CKTOTAL", "CKMB", "CKMBINDEX", "TROPONINI" in the last 168 hours. BNP: Invalid input(s): "POCBNP" CBG: Recent Labs  Lab 06/30/23 0802 06/30/23 1225 06/30/23 1729 06/30/23 2134 07/01/23 0738  GLUCAP 263* 291* 291* 295* 247*   D-Dimer No results for input(s): "DDIMER" in the last 72 hours. Hgb A1c No results for input(s): "HGBA1C" in the last 72 hours. Lipid Profile No results for input(s): "CHOL", "HDL", "LDLCALC", "TRIG", "CHOLHDL", "LDLDIRECT" in the last 72 hours. Thyroid function studies No results for input(s): "TSH", "T4TOTAL", "T3FREE", "THYROIDAB" in the last 72 hours.  Invalid input(s): "FREET3" Anemia work up No results for input(s): "VITAMINB12", "FOLATE", "FERRITIN", "TIBC", "IRON", "RETICCTPCT" in the last 72 hours. Urinalysis    Component Value Date/Time   COLORURINE YELLOW (A) 03/30/2021 0958   APPEARANCEUR CLEAR (A) 03/30/2021 0958   APPEARANCEUR Cloudy (A) 11/20/2020 1611   LABSPEC 1.027 03/30/2021 0958   LABSPEC 1.024 11/12/2013 2001   PHURINE 5.0 03/30/2021 0958   GLUCOSEU >=500 (A) 03/30/2021 0958   GLUCOSEU >=500 11/12/2013 2001   HGBUR NEGATIVE 03/30/2021  0958   BILIRUBINUR NEGATIVE 03/30/2021 0958   BILIRUBINUR Negative 11/20/2020 1611   BILIRUBINUR Negative 11/12/2013 2001   KETONESUR NEGATIVE 03/30/2021 0958   PROTEINUR NEGATIVE 03/30/2021 0958   NITRITE NEGATIVE  03/30/2021 0958   LEUKOCYTESUR NEGATIVE 03/30/2021 0958   LEUKOCYTESUR Negative 11/12/2013 2001   Sepsis Labs Recent Labs  Lab 06/25/23 1610 06/26/23 0417 06/30/23 0429  WBC 9.6 10.1 9.4   Microbiology No results found for this or any previous visit (from the past 240 hour(s)).   Time coordinating discharge: Over 30 minutes  SIGNED:   Tresa Moore, MD  [facility] 07/01/2023, 10:41 AM Pager   If 7PM-7AM, please contact night-coverage

## 2023-07-02 ENCOUNTER — Encounter: Payer: 59 | Admitting: Physical Therapy

## 2023-07-15 ENCOUNTER — Other Ambulatory Visit: Payer: Self-pay | Admitting: Acute Care

## 2023-07-15 ENCOUNTER — Encounter (INDEPENDENT_AMBULATORY_CARE_PROVIDER_SITE_OTHER): Payer: Self-pay | Admitting: Vascular Surgery

## 2023-07-15 ENCOUNTER — Ambulatory Visit (INDEPENDENT_AMBULATORY_CARE_PROVIDER_SITE_OTHER): Payer: 59 | Admitting: Vascular Surgery

## 2023-07-15 VITALS — BP 114/77 | HR 86 | Resp 18 | Ht 76.0 in | Wt 250.0 lb

## 2023-07-15 DIAGNOSIS — I10 Essential (primary) hypertension: Secondary | ICD-10-CM | POA: Diagnosis not present

## 2023-07-15 DIAGNOSIS — Z122 Encounter for screening for malignant neoplasm of respiratory organs: Secondary | ICD-10-CM

## 2023-07-15 DIAGNOSIS — E782 Mixed hyperlipidemia: Secondary | ICD-10-CM | POA: Diagnosis not present

## 2023-07-15 DIAGNOSIS — E1151 Type 2 diabetes mellitus with diabetic peripheral angiopathy without gangrene: Secondary | ICD-10-CM | POA: Diagnosis not present

## 2023-07-15 DIAGNOSIS — Z87891 Personal history of nicotine dependence: Secondary | ICD-10-CM

## 2023-07-15 DIAGNOSIS — I7025 Atherosclerosis of native arteries of other extremities with ulceration: Secondary | ICD-10-CM

## 2023-07-15 DIAGNOSIS — F17210 Nicotine dependence, cigarettes, uncomplicated: Secondary | ICD-10-CM

## 2023-07-15 DIAGNOSIS — Z794 Long term (current) use of insulin: Secondary | ICD-10-CM

## 2023-07-15 NOTE — Assessment & Plan Note (Signed)
blood pressure control important in reducing the progression of atherosclerotic disease. On appropriate oral medications.  

## 2023-07-15 NOTE — Assessment & Plan Note (Signed)
Underwent successful revascularization about 3 weeks ago and is doing well.  Return to clinic in 1 to 2 months with ABIs and a duplex for follow-up.  Continue current medical regimen which includes Plavix, aspirin, and 2.5 mg of Eliquis twice daily as well as Lipitor.

## 2023-07-15 NOTE — Assessment & Plan Note (Signed)
blood glucose control important in reducing the progression of atherosclerotic disease. Also, involved in wound healing. On appropriate medications.  

## 2023-07-15 NOTE — Assessment & Plan Note (Signed)
lipid control important in reducing the progression of atherosclerotic disease. Continue statin therapy  

## 2023-07-15 NOTE — Progress Notes (Signed)
MRN : [id number]  LADARIEN BRAAM is a 67 y.o. ([birth year]) male who presents with chief complaint of  Chief Complaint  Patient presents with   Follow-up    Schedule an appointment with Annice Needy, MD 2 weeks   .  History of Present Illness: Patient returns today in follow up of his PAD.  He came into the hospital with rest pain and superficial ulcerations of the right leg about 3 weeks ago.  He underwent percutaneous revascularization with an intervention to his previous bypass for stenosis by my partner.  He has done well.  His rest pain has resolved.  He still has several superficial ulcerations on the right leg which are slowly healing.  No periprocedural complications.  Current Outpatient Medications  Medication Sig Dispense Refill   apixaban (ELIQUIS) 2.5 MG TABS tablet Take 1 tablet (2.5 mg total) by mouth 2 (two) times daily.     ascorbic acid (VITAMIN C) 500 MG tablet Take 1 tablet (500 mg total) by mouth 2 (two) times daily. 60 tablet 0   aspirin EC 81 MG tablet Take 1 tablet (81 mg total) by mouth daily. 150 tablet 2   atorvastatin (LIPITOR) 80 MG tablet Take 1 tablet (80 mg total) by mouth daily at 6 PM. 30 tablet 0   Cholecalciferol (VITAMIN D3) 125 MCG (5000 UT) CAPS Take 1 capsule by mouth daily.     clopidogrel (PLAVIX) 75 MG tablet Take 1 tablet (75 mg total) by mouth daily at 6 (six) AM. 10 tablet 0   diclofenac Sodium (VOLTAREN) 1 % GEL Apply 4 g topically 4 (four) times daily.     gabapentin (NEURONTIN) 300 MG capsule Take 1 capsule (300 mg total) by mouth 3 (three) times daily.     insulin glargine (LANTUS) 100 UNIT/ML Solostar Pen Inject 41 Units into the skin 2 (two) times daily. (Patient taking differently: Inject 40 Units into the skin at bedtime.) 15 mL 0   insulin lispro (HUMALOG) 100 UNIT/ML injection Inject 13 Units into the skin 3 (three) times daily before meals.     losartan (COZAAR) 25 MG tablet Take 1 tablet (25 mg total) by mouth daily. 30 tablet 0    magnesium oxide (MAG-OX) 400 MG tablet Take 0.5 tablets (200 mg total) by mouth daily. 30 tablet 0   methocarbamol (ROBAXIN) 500 MG tablet Take 1 tablet (500 mg total) by mouth every 6 (six) hours as needed for muscle spasms. 15 tablet 0   polyethylene glycol powder (GLYCOLAX/MIRALAX) 17 GM/SCOOP powder Take 34 g by mouth daily. 765 g 0   senna-docusate (SENOKOT-S) 8.6-50 MG tablet Take 1 tablet by mouth 2 (two) times daily.     TRULICITY 0.75 MG/0.5ML SOPN Inject into the skin.     Zinc Sulfate 220 (50 Zn) MG TABS Take 1 tablet (220 mg total) by mouth daily after supper. 30 tablet 0   acidophilus (RISAQUAD) CAPS capsule Take 1 capsule by mouth daily after supper. (Patient not taking: Reported on 07/15/2023) 60 capsule 1   NICORETTE STARTER KIT 4 MG gum Take 4 mg by mouth as needed for smoking cessation. (Patient not taking: Reported on 07/15/2023)     No current facility-administered medications for this visit.    Past Medical History:  Diagnosis Date   Coronary artery disease    Diabetes (HCC)    DVT (deep venous thrombosis) (HCC)    Gangrene of left foot (HCC) 02/09/2018   GERD (gastroesophageal reflux disease)  Hyperlipidemia    Hypertension    Peripheral vascular disease (HCC)     Past Surgical History:  Procedure Laterality Date   ABDOMINAL AORTOGRAM W/LOWER EXTREMITY N/A 02/20/2022   Procedure: ABDOMINAL AORTOGRAM W/LOWER EXTREMITY;  Surgeon: Cephus Shelling, MD;  [facility];  Service: Cardiovascular;  Laterality: N/A;   AMPUTATION Left 10/28/2018   Procedure: AMPUTATION BELOW KNEE;  Surgeon: Annice Needy, MD;  Location: [facility];  Service: Vascular;  Laterality: Left;   AMPUTATION Left 09/10/2021   Procedure: AMPUTATION DIGIT;  Surgeon: Allena Napoleon, MD;  Location: [facility];  Service: Plastics;  Laterality: Left;   AMPUTATION Left 09/14/2021   Procedure: AMPUTATION DIGIT left long finger and irrigation and debridement;  Surgeon: Allena Napoleon, MD;   Location: [facility];  Service: Plastics;  Laterality: Left;   AMPUTATION Right 02/27/2022   Procedure: RIGHT PARTIAL AMPUTATION FOOT;  Surgeon: Felecia Shelling, DPM;  [facility]: [facility];  Service: Podiatry;  Laterality: Right;   AMPUTATION TOE Left 02/10/2018   Procedure: AMPUTATION TOE;  Surgeon: Linus Galas, DPM;  Location: [facility];  Service: Podiatry;  Laterality: Left;   APPLICATION OF WOUND VAC Left 10/16/2018   Procedure: APPLICATION OF WOUND VAC;  Surgeon: Gwyneth Revels, DPM;  Location: [facility];  Service: Podiatry;  Laterality: Left;   DORSAL SLIT N/A 01/12/2021   Procedure: DORSAL SLIT;  Surgeon: Sondra Come, MD;  Location: [facility];  Service: Urology;  Laterality: N/A;   FEMORAL-TIBIAL BYPASS GRAFT Right 02/25/2022   Procedure: RIGHT LOWER EXTREMITY FEMORAL TO TIBIAL PERONEAL TRUNK BYPASS;  Surgeon: Cephus Shelling, MD;  [facility];  Service: Vascular;  Laterality: Right;  INSERT ARTERIAL LINE   groin surgery     IRRIGATION AND DEBRIDEMENT FOOT Left 09/30/2018   Procedure: IRRIGATION AND DEBRIDEMENT FOOT;  Surgeon: Linus Galas, DPM;  Location: [facility];  Service: Podiatry;  Laterality: Left;   LOWER EXTREMITY ANGIOGRAPHY Left 02/12/2018   Procedure: Lower Extremity Angiography;  Surgeon: Annice Needy, MD;  Location: [facility];  Service: Cardiovascular;  Laterality: Left;   LOWER EXTREMITY ANGIOGRAPHY Left 10/01/2018   Procedure: Lower Extremity Angiography;  Surgeon: Annice Needy, MD;  Location: [facility];  Service: Cardiovascular;  Laterality: Left;   LOWER EXTREMITY ANGIOGRAPHY Left 10/19/2018   Procedure: Lower Extremity Angiography;  Surgeon: Annice Needy, MD;  Location: [facility];  Service: Cardiovascular;  Laterality: Left;   LOWER EXTREMITY ANGIOGRAPHY Left 10/20/2018   Procedure: LOWER EXTREMITY ANGIOGRAPHY;  Surgeon: Annice Needy, MD;  Location: [facility];  Service: Cardiovascular;  Laterality: Left;   LOWER EXTREMITY ANGIOGRAPHY  Right 11/25/2018   Procedure: LOWER EXTREMITY ANGIOGRAPHY;  Surgeon: Annice Needy, MD;  Location: [facility];  Service: Cardiovascular;  Laterality: Right;   LOWER EXTREMITY ANGIOGRAPHY Right 01/10/2020   Procedure: LOWER EXTREMITY ANGIOGRAPHY;  Surgeon: Annice Needy, MD;  Location: [facility];  Service: Cardiovascular;  Laterality: Right;   LOWER EXTREMITY ANGIOGRAPHY Right 12/11/2020   Procedure: LOWER EXTREMITY ANGIOGRAPHY;  Surgeon: Annice Needy, MD;  Location: [facility];  Service: Cardiovascular;  Laterality: Right;   LOWER EXTREMITY ANGIOGRAPHY Right 06/26/2023   Procedure: Lower Extremity Angiography;  Surgeon: Renford Dills, MD;  Location: [facility];  Service: Cardiovascular;  Laterality: Right;   LOWER EXTREMITY INTERVENTION  02/12/2018   Procedure: LOWER EXTREMITY INTERVENTION;  Surgeon: Annice Needy, MD;  Location: [facility]  INV[facility];  Service: Cardiovascular;;   MINOR IRRIGATION AND DEBRIDEMENT OF WOUND Left 09/10/2021   Procedure: IRRIGATION AND DEBRIDEMENT OF LEFT LONG FINGER WOUND;  Surgeon: Allena Napoleon, MD;  Location: [facility];  Service: Plastics;  Laterality: Left;   NECK SURGERY     PERIPHERAL VASCULAR INTERVENTION  02/20/2022   Procedure: PERIPHERAL VASCULAR INTERVENTION;  Surgeon: Cephus Shelling, MD;  [facility];  Service: Cardiovascular;;  right iliac   TRANSMETATARSAL AMPUTATION Left 10/16/2018   Procedure: TRANSMETATARSAL AMPUTATION LEFT FOOT;  Surgeon: Gwyneth Revels, DPM;  Location: [facility];  Service: Podiatry;  Laterality: Left;   VEIN HARVEST Right 02/25/2022   Procedure: VEIN HARVEST OF RIGHT GREATER SAPHENOUS VEIN;  Surgeon: Cephus Shelling, MD;  [facility];  Service: Vascular;  Laterality: Right;     Social History   Tobacco Use   Smoking status: Former    Current packs/day: 0.00    Average packs/day: 1 pack/day for 47.0 years (47.0 ttl pk-yrs)    Types: Cigarettes    Start date:  01/1974    Quit date: 01/2021    Years since quitting: 2.5   Smokeless tobacco: Never  Vaping Use   Vaping status: Never Used  Substance Use Topics   Alcohol use: No   Drug use: No      Family History  Problem Relation Age of Onset   Diabetes Mother    Coronary artery disease Father    Hypertension Sister    Leukemia Brother      No Known Allergies   REVIEW OF SYSTEMS (Negative unless checked)  Constitutional: [] Weight loss  [] Fever  [] Chills Cardiac: [] Chest pain   [] Chest pressure   [] Palpitations   [] Shortness of breath when laying flat   [] Shortness of breath at rest   [] Shortness of breath with exertion. Vascular:  [] Pain in legs with walking   [] Pain in legs at rest   [] Pain in legs when laying flat   [] Claudication   [] Pain in feet when walking  [] Pain in feet at rest  [] Pain in feet when laying flat   [x] History of DVT   [] Phlebitis   [] Swelling in legs   [] Varicose veins   [x] Non-healing ulcers Pulmonary:   [] Uses home oxygen   [] Productive cough   [] Hemoptysis   [] Wheeze  [] COPD   [] Asthma Neurologic:  [] Dizziness  [] Blackouts   [] Seizures   [] History of stroke   [] History of TIA  [] Aphasia   [] Temporary blindness   [] Dysphagia   [] Weakness or numbness in arms   [] Weakness or numbness in legs Musculoskeletal:  [x] Arthritis   [] Joint swelling   [] Joint pain   [] Low back pain Hematologic:  [] Easy bruising  [] Easy bleeding   [] Hypercoagulable state   [] Anemic   Gastrointestinal:  [] Blood in stool   [] Vomiting blood  [x] Gastroesophageal reflux/heartburn   [] Abdominal pain Genitourinary:  [] Chronic kidney disease   [] Difficult urination  [] Frequent urination  [] Burning with urination   [] Hematuria Skin:  [] Rashes   [x] Ulcers   [x] Wounds Psychological:  [] History of anxiety   []  History of major depression.  Physical Examination  BP 114/77 (BP Location: Left Arm)   Pulse 86   Resp 18   Ht 6\' 4"  (1.93 m)   Wt 250 lb (113.4 kg)   BMI 30.43 kg/m  Gen:  WD/WN,  NAD Head: [place]/AT, No temporalis wasting. Ear/Nose/Throat: Hearing grossly intact, nares w/o erythema or drainage Eyes: Conjunctiva clear. Sclera non-icteric Neck: Supple.  Trachea midline  Pulmonary:  Good air movement, no use of accessory muscles.  Cardiac: RRR, no JVD Vascular:  Vessel Right Left  Radial Palpable Palpable                          PT 1+ Palpable Not Palpable  DP Not Palpable Not Palpable   Gastrointestinal: soft, non-tender/non-distended. No guarding/reflex.  Musculoskeletal: M/S 5/5 throughout.  No deformity or atrophy.  Several superficial ulcerations on the anterior and lateral portions of the right calf and lower leg area.  1-2+ right lower extremity edema.  In a wheelchair.  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 Neurologic: Sensation grossly intact in extremities.  Symmetrical.  Speech is fluent.  Psychiatric: Judgment intact, Mood & affect appropriate for pt's clinical situation. Dermatologic: Right calf wounds as described above.      Labs Recent Results (from the past 2160 hour(s))  POC CBG, ED     Status: Abnormal   Collection Time: 06/25/23  4:04 PM  Result Value Ref Range   Glucose-Capillary 113 (H) 70 - 99 mg/dL    Comment: Glucose reference range applies only to samples taken after fasting for at least 8 hours.   Comment 1 Notify RN    Comment 2 Document in Chart   CBC with Differential     Status: None   Collection Time: 06/25/23  4:10 PM  Result Value Ref Range   WBC 9.6 4.0 - 10.5 K/uL   RBC 5.79 4.22 - 5.81 MIL/uL   Hemoglobin 16.1 13.0 - 17.0 g/dL   HCT 19.1 47.8 - 29.5 %   MCV 85.7 80.0 - 100.0 fL   MCH 27.8 26.0 - 34.0 pg   MCHC 32.5 30.0 - 36.0 g/dL   RDW 62.1 30.8 - 65.7 %   Platelets 209 150 - 400 K/uL   nRBC 0.0 0.0 - 0.2 %   Neutrophils Relative % 65 %   Neutro Abs 6.3 1.7 - 7.7 K/uL   Lymphocytes Relative 28 %   Lymphs Abs 2.7 0.7 - 4.0 K/uL   Monocytes Relative 6 %   Monocytes Absolute 0.6 0.1 - 1.0 K/uL   Eosinophils  Relative 0 %   Eosinophils Absolute 0.0 0.0 - 0.5 K/uL   Basophils Relative 0 %   Basophils Absolute 0.0 0.0 - 0.1 K/uL   Immature Granulocytes 1 %   Abs Immature Granulocytes 0.05 0.00 - 0.07 K/uL    Comment: Performed at [facility], [address] [place], [place] [address]  Comprehensive metabolic panel     Status: Abnormal   Collection Time: 06/25/23  4:10 PM  Result Value Ref Range   Sodium 137 135 - 145 mmol/L   Potassium 4.5 3.5 - 5.1 mmol/L    Comment: HEMOLYSIS AT THIS LEVEL MAY AFFECT RESULT   Chloride 106 98 - 111 mmol/L   CO2 22 22 - 32 mmol/L   Glucose, Bld 132 (H) 70 - 99 mg/dL    Comment: Glucose reference range applies only to samples taken after fasting for at least 8 hours.   BUN 14 8 - 23 mg/dL   Creatinine, Ser 2.95 0.61 - 1.24 mg/dL   Calcium 9.6 8.9 - 28.4 mg/dL   Total Protein 8.1 6.5 - 8.1 g/dL   Albumin 4.2 3.5 - 5.0 g/dL   AST 26 15 - 41 U/L    Comment: HEMOLYSIS AT THIS LEVEL MAY AFFECT RESULT   ALT 20 0 - 44 U/L  Comment: HEMOLYSIS AT THIS LEVEL MAY AFFECT RESULT   Alkaline Phosphatase 87 38 - 126 U/L   Total Bilirubin 1.2 0.3 - 1.2 mg/dL    Comment: HEMOLYSIS AT THIS LEVEL MAY AFFECT RESULT   GFR, Estimated >60 >60 mL/min    Comment: (NOTE) Calculated using the CKD-EPI Creatinine Equation (2021)    Anion gap 9 5 - 15    Comment: Performed at [facility], [address] [place], [place] [address]  APTT     Status: None   Collection Time: 06/25/23  6:12 PM  Result Value Ref Range   aPTT 34 24 - 36 seconds    Comment: Performed at [facility], [address] [place], [place] [address]  Protime-INR     Status: Abnormal   Collection Time: 06/25/23  6:12 PM  Result Value Ref Range   Prothrombin Time 15.8 (H) 11.4 - 15.2 seconds   INR 1.2 0.8 - 1.2    Comment: (NOTE) INR goal varies based on device and disease states. Performed at [facility], [address] [place], [place] [address]   Heparin  level (unfractionated)     Status: Abnormal   Collection Time: 06/25/23  6:12 PM  Result Value Ref Range   Heparin Unfractionated >1.10 (H) 0.30 - 0.70 IU/mL    Comment: (NOTE) The clinical reportable range upper limit is being lowered to >1.10 to align with the FDA approved guidance for the current laboratory assay.  If heparin results are below expected values, and patient dosage has  been confirmed, suggest follow up testing of antithrombin III levels. Performed at [facility], [address] [place], [place] [address]   CBG monitoring, ED     Status: None   Collection Time: 06/25/23  6:55 PM  Result Value Ref Range   Glucose-Capillary 99 70 - 99 mg/dL    Comment: Glucose reference range applies only to samples taken after fasting for at least 8 hours.  Glucose, capillary     Status: Abnormal   Collection Time: 06/25/23 10:42 PM  Result Value Ref Range   Glucose-Capillary 197 (H) 70 - 99 mg/dL    Comment: Glucose reference range applies only to samples taken after fasting for at least 8 hours.  HIV Antibody (routine testing w rflx)     Status: None   Collection Time: 06/26/23  1:06 AM  Result Value Ref Range   HIV Screen 4th Generation wRfx Non Reactive Non Reactive    Comment: Performed at [facility], [address] [place], [place] [address]  APTT     Status: Abnormal   Collection Time: 06/26/23  1:06 AM  Result Value Ref Range   aPTT 105 (H) 24 - 36 seconds    Comment:        IF BASELINE aPTT IS ELEVATED, SUGGEST PATIENT RISK ASSESSMENT BE USED TO DETERMINE APPROPRIATE ANTICOAGULANT THERAPY. Performed at [facility], [address] [place], [place] [address]   Basic metabolic panel     Status: Abnormal   Collection Time: 06/26/23  4:17 AM  Result Value Ref Range   Sodium 138 135 - 145 mmol/L   Potassium 4.1 3.5 - 5.1 mmol/L   Chloride 106 98 - 111 mmol/L   CO2 25 22 - 32 mmol/L   Glucose, Bld 182 (H) 70 - 99 mg/dL    Comment:  Glucose reference range applies only to samples taken after fasting for at least 8 hours.   BUN  13 8 - 23 mg/dL   Creatinine, Ser 5.28 0.61 - 1.24 mg/dL   Calcium 8.9 8.9 - 41.3 mg/dL   GFR, Estimated >24 >40 mL/min    Comment: (NOTE) Calculated using the CKD-EPI Creatinine Equation (2021)    Anion gap 7 5 - 15    Comment: Performed at [facility], [address] [place], [place] [address]  CBC     Status: None   Collection Time: 06/26/23  4:17 AM  Result Value Ref Range   WBC 10.1 4.0 - 10.5 K/uL   RBC 4.90 4.22 - 5.81 MIL/uL   Hemoglobin 13.7 13.0 - 17.0 g/dL   HCT 53.6 64.4 - 03.4 %   MCV 83.7 80.0 - 100.0 fL   MCH 28.0 26.0 - 34.0 pg   MCHC 33.4 30.0 - 36.0 g/dL   RDW 74.2 59.5 - 63.8 %   Platelets 204 150 - 400 K/uL   nRBC 0.0 0.0 - 0.2 %    Comment: Performed at [facility], [address] [place], [place] [address]  Glucose, capillary     Status: Abnormal   Collection Time: 06/26/23  7:45 AM  Result Value Ref Range   Glucose-Capillary 166 (H) 70 - 99 mg/dL    Comment: Glucose reference range applies only to samples taken after fasting for at least 8 hours.   Comment 1 Notify RN    Comment 2 Document in Chart   APTT     Status: Abnormal   Collection Time: 06/26/23  8:28 AM  Result Value Ref Range   aPTT 140 (H) 24 - 36 seconds    Comment:        IF BASELINE aPTT IS ELEVATED, SUGGEST PATIENT RISK ASSESSMENT BE USED TO DETERMINE APPROPRIATE ANTICOAGULANT THERAPY. Performed at [facility], [address] [place], [place] [address]   Glucose, capillary     Status: Abnormal   Collection Time: 06/26/23 10:41 AM  Result Value Ref Range   Glucose-Capillary 158 (H) 70 - 99 mg/dL    Comment: Glucose reference range applies only to samples taken after fasting for at least 8 hours.  Glucose, capillary     Status: Abnormal   Collection Time: 06/26/23 12:10 PM  Result Value Ref Range   Glucose-Capillary 137 (H) 70 - 99 mg/dL    Comment:  Glucose reference range applies only to samples taken after fasting for at least 8 hours.  Glucose, capillary     Status: Abnormal   Collection Time: 06/26/23  4:54 PM  Result Value Ref Range   Glucose-Capillary 187 (H) 70 - 99 mg/dL    Comment: Glucose reference range applies only to samples taken after fasting for at least 8 hours.  Glucose, capillary     Status: Abnormal   Collection Time: 06/26/23  9:11 PM  Result Value Ref Range   Glucose-Capillary 137 (H) 70 - 99 mg/dL    Comment: Glucose reference range applies only to samples taken after fasting for at least 8 hours.  Glucose, capillary     Status: Abnormal   Collection Time: 06/27/23  8:05 AM  Result Value Ref Range   Glucose-Capillary 153 (H) 70 - 99 mg/dL    Comment: Glucose reference range applies only to samples taken after fasting for at least 8 hours.   Comment 1 Notify RN    Comment 2 Document in Chart   Glucose, capillary     Status: Abnormal   Collection Time: 06/27/23 11:48 AM  Result  Value Ref Range   Glucose-Capillary 190 (H) 70 - 99 mg/dL    Comment: Glucose reference range applies only to samples taken after fasting for at least 8 hours.   Comment 1 Notify RN    Comment 2 Document in Chart   Glucose, capillary     Status: Abnormal   Collection Time: 06/27/23  3:57 PM  Result Value Ref Range   Glucose-Capillary 153 (H) 70 - 99 mg/dL    Comment: Glucose reference range applies only to samples taken after fasting for at least 8 hours.   Comment 1 Notify RN    Comment 2 Document in Chart   Glucose, capillary     Status: Abnormal   Collection Time: 06/27/23  9:34 PM  Result Value Ref Range   Glucose-Capillary 158 (H) 70 - 99 mg/dL    Comment: Glucose reference range applies only to samples taken after fasting for at least 8 hours.   Comment 1 Notify RN    Comment 2 Document in Chart   Glucose, capillary     Status: Abnormal   Collection Time: 06/28/23  7:42 AM  Result Value Ref Range   Glucose-Capillary 188  (H) 70 - 99 mg/dL    Comment: Glucose reference range applies only to samples taken after fasting for at least 8 hours.   Comment 1 Notify RN    Comment 2 Document in Chart   Glucose, capillary     Status: Abnormal   Collection Time: 06/28/23 10:30 AM  Result Value Ref Range   Glucose-Capillary 250 (H) 70 - 99 mg/dL    Comment: Glucose reference range applies only to samples taken after fasting for at least 8 hours.  Glucose, capillary     Status: Abnormal   Collection Time: 06/28/23 12:02 PM  Result Value Ref Range   Glucose-Capillary 199 (H) 70 - 99 mg/dL    Comment: Glucose reference range applies only to samples taken after fasting for at least 8 hours.   Comment 1 Notify RN    Comment 2 Document in Chart   Glucose, capillary     Status: Abnormal   Collection Time: 06/28/23  4:35 PM  Result Value Ref Range   Glucose-Capillary 225 (H) 70 - 99 mg/dL    Comment: Glucose reference range applies only to samples taken after fasting for at least 8 hours.   Comment 1 Notify RN    Comment 2 Document in Chart   Glucose, capillary     Status: Abnormal   Collection Time: 06/28/23  8:59 PM  Result Value Ref Range   Glucose-Capillary 219 (H) 70 - 99 mg/dL    Comment: Glucose reference range applies only to samples taken after fasting for at least 8 hours.  Glucose, capillary     Status: Abnormal   Collection Time: 06/29/23  8:18 AM  Result Value Ref Range   Glucose-Capillary 179 (H) 70 - 99 mg/dL    Comment: Glucose reference range applies only to samples taken after fasting for at least 8 hours.  Glucose, capillary     Status: Abnormal   Collection Time: 06/29/23 12:05 PM  Result Value Ref Range   Glucose-Capillary 290 (H) 70 - 99 mg/dL    Comment: Glucose reference range applies only to samples taken after fasting for at least 8 hours.  Glucose, capillary     Status: Abnormal   Collection Time: 06/29/23  4:05 PM  Result Value Ref Range   Glucose-Capillary 248 (H) 70 - 99 mg/dL  Comment: Glucose reference range applies only to samples taken after fasting for at least 8 hours.  Glucose, capillary     Status: Abnormal   Collection Time: 06/29/23  9:28 PM  Result Value Ref Range   Glucose-Capillary 234 (H) 70 - 99 mg/dL    Comment: Glucose reference range applies only to samples taken after fasting for at least 8 hours.  CBC     Status: None   Collection Time: 06/30/23  4:29 AM  Result Value Ref Range   WBC 9.4 4.0 - 10.5 K/uL   RBC 5.46 4.22 - 5.81 MIL/uL   Hemoglobin 15.3 13.0 - 17.0 g/dL   HCT 62.1 30.8 - 65.7 %   MCV 82.2 80.0 - 100.0 fL   MCH 28.0 26.0 - 34.0 pg   MCHC 34.1 30.0 - 36.0 g/dL   RDW 84.6 96.2 - 95.2 %   Platelets 188 150 - 400 K/uL   nRBC 0.0 0.0 - 0.2 %    Comment: Performed at [facility], [address] [place], [place] [address]  Glucose, capillary     Status: Abnormal   Collection Time: 06/30/23  8:02 AM  Result Value Ref Range   Glucose-Capillary 263 (H) 70 - 99 mg/dL    Comment: Glucose reference range applies only to samples taken after fasting for at least 8 hours.   Comment 1 Notify RN   Basic metabolic panel     Status: Abnormal   Collection Time: 06/30/23 10:57 AM  Result Value Ref Range   Sodium 138 135 - 145 mmol/L   Potassium 4.5 3.5 - 5.1 mmol/L   Chloride 104 98 - 111 mmol/L   CO2 25 22 - 32 mmol/L   Glucose, Bld 289 (H) 70 - 99 mg/dL    Comment: Glucose reference range applies only to samples taken after fasting for at least 8 hours.   BUN 18 8 - 23 mg/dL   Creatinine, Ser 4.40 0.61 - 1.24 mg/dL   Calcium 9.5 8.9 - 10.2 mg/dL   GFR, Estimated >72 >53 mL/min    Comment: (NOTE) Calculated using the CKD-EPI Creatinine Equation (2021)    Anion gap 9 5 - 15    Comment: Performed at [facility], [address] [place], [place] [address]  Glucose, capillary     Status: Abnormal   Collection Time: 06/30/23 12:25 PM  Result Value Ref Range   Glucose-Capillary 291 (H) 70 - 99 mg/dL    Comment:  Glucose reference range applies only to samples taken after fasting for at least 8 hours.   Comment 1 Notify RN    Comment 2 Document in Chart   Glucose, capillary     Status: Abnormal   Collection Time: 06/30/23  5:29 PM  Result Value Ref Range   Glucose-Capillary 291 (H) 70 - 99 mg/dL    Comment: Glucose reference range applies only to samples taken after fasting for at least 8 hours.   Comment 1 Notify RN   Glucose, capillary     Status: Abnormal   Collection Time: 06/30/23  9:34 PM  Result Value Ref Range   Glucose-Capillary 295 (H) 70 - 99 mg/dL    Comment: Glucose reference range applies only to samples taken after fasting for at least 8 hours.  Glucose, capillary     Status: Abnormal   Collection Time: 07/01/23  7:38 AM  Result Value Ref Range   Glucose-Capillary 247 (H) 70 - 99 mg/dL    Comment: Glucose  reference range applies only to samples taken after fasting for at least 8 hours.  Glucose, capillary     Status: Abnormal   Collection Time: 07/01/23 11:58 AM  Result Value Ref Range   Glucose-Capillary 313 (H) 70 - 99 mg/dL    Comment: Glucose reference range applies only to samples taken after fasting for at least 8 hours.   Comment 1 Notify RN    Comment 2 Document in Chart     Radiology PERIPHERAL VASCULAR CATHETERIZATION  Result Date: 06/26/2023 See surgical note for result.   Assessment/Plan  Atherosclerosis of native arteries of the extremities with ulceration (HCC) Underwent successful revascularization about 3 weeks ago and is doing well.  Return to clinic in 1 to 2 months with ABIs and a duplex for follow-up.  Continue current medical regimen which includes Plavix, aspirin, and 2.5 mg of Eliquis twice daily as well as Lipitor.  Essential hypertension blood pressure control important in reducing the progression of atherosclerotic disease. On appropriate oral medications.    Type 2 diabetes mellitus with diabetic peripheral angiopathy without gangrene  (HCC) blood glucose control important in reducing the progression of atherosclerotic disease. Also, involved in wound healing. On appropriate medications.   Hyperlipidemia lipid control important in reducing the progression of atherosclerotic disease. Continue statin therapy    Festus Barren, MD  07/15/2023 11:13 AM    This note was created with Dragon medical transcription system.  Any errors from dictation are purely unintentional

## 2023-07-23 ENCOUNTER — Encounter (INDEPENDENT_AMBULATORY_CARE_PROVIDER_SITE_OTHER): Payer: Medicare Other

## 2023-07-23 ENCOUNTER — Ambulatory Visit (INDEPENDENT_AMBULATORY_CARE_PROVIDER_SITE_OTHER): Payer: Medicare Other | Admitting: Nurse Practitioner

## 2023-08-06 ENCOUNTER — Other Ambulatory Visit: Payer: Self-pay

## 2023-08-06 ENCOUNTER — Emergency Department
Admission: EM | Admit: 2023-08-06 | Discharge: 2023-08-07 | Disposition: A | Discharge status: Home / Self Care | Payer: 59 | Attending: Emergency Medicine | Admitting: Emergency Medicine

## 2023-08-06 ENCOUNTER — Emergency Department: Payer: 59

## 2023-08-06 DIAGNOSIS — Z7982 Long term (current) use of aspirin: Secondary | ICD-10-CM | POA: Diagnosis not present

## 2023-08-06 DIAGNOSIS — E119 Type 2 diabetes mellitus without complications: Secondary | ICD-10-CM | POA: Diagnosis not present

## 2023-08-06 DIAGNOSIS — I2510 Atherosclerotic heart disease of native coronary artery without angina pectoris: Secondary | ICD-10-CM | POA: Diagnosis not present

## 2023-08-06 DIAGNOSIS — I739 Peripheral vascular disease, unspecified: Secondary | ICD-10-CM

## 2023-08-06 DIAGNOSIS — Z7902 Long term (current) use of antithrombotics/antiplatelets: Secondary | ICD-10-CM | POA: Insufficient documentation

## 2023-08-06 DIAGNOSIS — I110 Hypertensive heart disease with heart failure: Secondary | ICD-10-CM | POA: Insufficient documentation

## 2023-08-06 DIAGNOSIS — I5030 Unspecified diastolic (congestive) heart failure: Secondary | ICD-10-CM | POA: Diagnosis not present

## 2023-08-06 DIAGNOSIS — M7989 Other specified soft tissue disorders: Secondary | ICD-10-CM | POA: Insufficient documentation

## 2023-08-06 DIAGNOSIS — Z7901 Long term (current) use of anticoagulants: Secondary | ICD-10-CM | POA: Insufficient documentation

## 2023-08-06 LAB — CBC
HCT: 42 % (ref 39.0–52.0)
Hemoglobin: 14 g/dL (ref 13.0–17.0)
MCH: 27.9 pg (ref 26.0–34.0)
MCHC: 33.3 g/dL (ref 30.0–36.0)
MCV: 83.8 fL (ref 80.0–100.0)
Platelets: 219 10*3/uL (ref 150–400)
RBC: 5.01 MIL/uL (ref 4.22–5.81)
RDW: 15.1 % (ref 11.5–15.5)
WBC: 9.3 10*3/uL (ref 4.0–10.5)
nRBC: 0 % (ref 0.0–0.2)

## 2023-08-06 LAB — BASIC METABOLIC PANEL
Anion gap: 7 (ref 5–15)
BUN: 15 mg/dL (ref 8–23)
CO2: 24 mmol/L (ref 22–32)
Calcium: 9.1 mg/dL (ref 8.9–10.3)
Chloride: 110 mmol/L (ref 98–111)
Creatinine, Ser: 0.74 mg/dL (ref 0.61–1.24)
GFR, Estimated: >60 mL/min (ref >60)
Glucose, Bld: 180 mg/dL — ABNORMAL HIGH (ref 70–99)
Potassium: 4.1 mmol/L (ref 3.5–5.1)
Sodium: 141 mmol/L (ref 135–145)

## 2023-08-06 NOTE — ED Notes (Signed)
Pt moved to bed with help of a [facility] and the CT tech. Pt is caox4, in no acute distress.

## 2023-08-06 NOTE — ED Notes (Signed)
First nurse note: Pt to ED [facility] from home for swelling, redness to R leg for 3-4 days, hx DM. Also R knee is locked up, painful to weight bear. Pt has L BKA Takes Eliquis, gabapentin. Hx DM, HTN  Pulse 86, CBG 192, 142/74, SPO2 98% RA, temp 98. Alert and oriented

## 2023-08-06 NOTE — ED Provider Notes (Signed)
[facility] Provider Note    Event Date/Time   First MD Initiated Contact with Patient 08/06/23 2030     (approximate)   History   Leg Pain and Leg Swelling   HPI Austin [name] is a 67 y.o. male with history of PVD s/p right common femoral to TP trunk bypass with vein on 02/25/2022, HTN, HLD, DM2, CAD, diastolic heart failure, A-fib on Eliquis, s/p left BKA presenting today for leg pain.  Patient was recently in the hospital undergoing procedures on his right lower extremity for claudication.  Was following up in rehab and has noticed increasing swelling and pain in his right lower extremity most probably around the foot and ankle.  Had seen Dr. Wyn Quaker with vascular surgery in his clinic with plan for outpatient ultrasound imaging for follow-up but has not gotten that yet.  Came in today for worsening pain and difficulty lifting the leg.  Denies fevers, shortness of breath.  No missed doses of his aspirin, Plavix, or Eliquis.  Per chart review: Patient was admitted back in September for leg pain involving the entire right leg.  Did not have palpable pulses in the right foot at that time but had biphasic Doppler.  Vascular surgery was consulted and was taken emergently to the OR for recannulation.  Ultimately discharged 4 days later with SNF follow-up.     Physical Exam   Triage Vital Signs: ED Triage Vitals  Encounter Vitals Group     BP 08/06/23 1731 (!) 144/109     Systolic BP Percentile --      Diastolic BP Percentile --      Pulse Rate 08/06/23 1731 80     Resp 08/06/23 1731 18     Temp 08/06/23 1731 98 F (36.7 C)     Temp Source 08/06/23 1731 Oral     SpO2 08/06/23 1731 94 %     Weight 08/06/23 1729 250 lb (113.4 kg)     Height 08/06/23 1729 6\' 4"  (1.93 m)     Head Circumference --      Peak Flow --      Pain Score 08/06/23 1728 9     Pain Loc --      Pain Education --      Exclude from Growth Chart --     Most recent vital signs: Vitals:    08/06/23 2049 08/06/23 2100  BP: (!) 168/91   Pulse: 65   Resp: 19   Temp:  98 F (36.7 C)  SpO2: 100%    I have reviewed the vital signs. General:  Awake, alert, no acute distress. Head:  Normocephalic, Atraumatic. EENT:  PERRL, EOMI, Oral mucosa pink and moist, Neck is supple. Cardiovascular: Regular rate, 2+ distal pulses. Respiratory:  Normal respiratory effort, symmetrical expansion, no distress.   Extremities: BKA to left lower extremity.  Right lower extremity with 2+ pitting edema to the dorsal right foot and 1+ pitting edema to the lower right ankle.  No erythema.  Capillary refill less than 3 seconds.  Mild tenderness palpation around the area.  Prior surgical scars of the right lower extremity. Neuro:  Alert and oriented.  Interacting appropriately.   Skin:  Warm, dry, no rash.   Psych: Appropriate affect.    ED Results / Procedures / Treatments   Labs (all labs ordered are listed, but only abnormal results are displayed) Labs Reviewed  BASIC METABOLIC PANEL - Abnormal; Notable for the following components:  Result Value   Glucose, Bld 180 (*)    All other components within normal limits  CBC     EKG    RADIOLOGY Independently interpreted ultrasound imaging and agree with radiology read   PROCEDURES:  Critical Care performed: No  Procedures   MEDICATIONS ORDERED IN ED: Medications - No data to display   IMPRESSION / MDM / ASSESSMENT AND PLAN / ED COURSE  I reviewed the triage vital signs and the nursing notes.                              Differential diagnosis includes, but is not limited to, DVT, bypass stenosis, stent thrombosis  Patient's presentation is most consistent with acute complicated illness / injury requiring diagnostic workup.  Patient is a 67 year old male presenting today for swelling to his right lower extremity over the past 2 to 3 weeks associated with pain.  Was seen by his vascular surgery outpatient and was planned to  have outpatient imaging follow-up.  Has not seen them yet for this.  Notes mostly swelling and some pain.  Denies any numbness, tingling, or change in temperature.  Exam is notable for swelling to the right lower extremity with 1+ pitting edema over the dorsal foot and proximal ankle.  No palpable pulses but dopplerable DP pulse.  This appears consistent with his exam 3 weeks ago with the vascular surgery team.  Notes that he did have the same symptoms when seen there in their clinic.  Vital signs otherwise stable.  No discoloration of the foot.  Initial ultrasound was performed to evaluate for possible DVT.  This was negative.  However, radiologist did call me stating that there was concern of possible stenosis versus occlusion of one of his prior bypass grafts.  Recommend follow-up imaging with CTA runoff.  This was ordered and patient was signed out to oncoming provider pending results of this.  The patient is on the cardiac monitor to evaluate for evidence of arrhythmia and/or significant heart rate changes. Clinical Course as of 08/06/23 2340  Wed Aug 06, 2023  2320 US Venous Img Lower Unilateral Right Radiology - no evidence of DVT. Looks like he has stent in the superficial artery that may be thrombosed. Would recommend CTA runoff for further evaluation [DW]    Clinical Course User Index [DW] Janith Lima, MD     FINAL CLINICAL IMPRESSION(S) / ED DIAGNOSES   Final diagnoses:  Right leg swelling     Rx / DC Orders   ED Discharge Orders     None        Note:  This document was prepared using Dragon voice recognition software and may include unintentional dictation errors.   Janith Lima, MD 08/06/23 [phone]

## 2023-08-06 NOTE — ED Triage Notes (Signed)
Pt via POV from home. Pt c/o R leg pain and swelling for the past couple of weeks. Pt was seen and admitted here on 9/18 for vascular surgery, reports that he went to rehab after which but states that the pain never got any better. States he take Eliquis and ASA and has been taking them as prescribed. Pt is A&OX4 and NAD

## 2023-08-07 ENCOUNTER — Emergency Department: Payer: 59

## 2023-08-07 DIAGNOSIS — M7989 Other specified soft tissue disorders: Secondary | ICD-10-CM | POA: Diagnosis not present

## 2023-08-07 MED ORDER — OXYCODONE-ACETAMINOPHEN 5-325 MG PO TABS: 1.0000 | ORAL_TABLET | ORAL | 0 refills | Status: DC | PRN

## 2023-08-07 MED ORDER — IOHEXOL 350 MG/ML SOLN
125.0000 mL | Freq: Once | INTRAVENOUS | Status: AC | PRN
  Administered 2023-08-07: 125 mL via INTRAVENOUS

## 2023-08-07 NOTE — ED Provider Notes (Signed)
-----------------------------------------   12:34 AM on 08/07/2023 -----------------------------------------   Patient awaiting CTA to evaluate arterial occlusion.  ----------------------------------------- 3:02 AM on 08/07/2023 -----------------------------------------   Delay due to radiology; still awaiting CT interpretation.  ----------------------------------------- 3:59 AM on 08/07/2023 -----------------------------------------   CTA:   1. Patent right common femoral artery to tibioperoneal trunk bypass  graft. Three-vessel runoff of the right lower extremity.  2. Marked subcutaneous soft tissue edema about the right calf and  foot. No abscess or soft tissue gas.  3. Left BKA. Occluded left superficial femoral-popliteal artery  stent. The left lower extremity is supplied by collaterals from the  profunda femoral artery.  4. No acute abnormality in the abdomen or pelvis.   Patient resting in NAD. Updated him of CTA results. Prescribe prn analgesics, TED hose and patient will follow up with vascular surgery. Strict return precautions given. Patient verbalizes understanding and agrees with plan of care.   Irean Hong, MD 08/07/23 [phone]

## 2023-08-07 NOTE — ED Notes (Signed)
Discharged with sister. Left with all of belongings. Pt alert and oriented X4, cooperative, RR even and unlabored, color WNL. Pt in NAD.

## 2023-08-07 NOTE — Discharge Instructions (Signed)
Wear TED hose as directed. You may take pain medicine as needed. Return to the ER for worsening symptoms, persistent vomiting, difficulty breathing or other concerns.

## 2023-08-20 ENCOUNTER — Ambulatory Visit (INDEPENDENT_AMBULATORY_CARE_PROVIDER_SITE_OTHER): Payer: 59

## 2023-08-20 ENCOUNTER — Encounter (INDEPENDENT_AMBULATORY_CARE_PROVIDER_SITE_OTHER): Payer: Self-pay | Admitting: Nurse Practitioner

## 2023-08-20 ENCOUNTER — Ambulatory Visit (INDEPENDENT_AMBULATORY_CARE_PROVIDER_SITE_OTHER): Payer: 59 | Admitting: Nurse Practitioner

## 2023-08-20 VITALS — BP 125/72 | HR 82 | Resp 18 | Ht 76.0 in | Wt 250.0 lb

## 2023-08-20 DIAGNOSIS — I7025 Atherosclerosis of native arteries of other extremities with ulceration: Secondary | ICD-10-CM

## 2023-08-20 DIAGNOSIS — E1151 Type 2 diabetes mellitus with diabetic peripheral angiopathy without gangrene: Secondary | ICD-10-CM | POA: Diagnosis not present

## 2023-08-20 DIAGNOSIS — I10 Essential (primary) hypertension: Secondary | ICD-10-CM

## 2023-08-20 DIAGNOSIS — Z794 Long term (current) use of insulin: Secondary | ICD-10-CM | POA: Diagnosis not present

## 2023-08-21 ENCOUNTER — Telehealth (INDEPENDENT_AMBULATORY_CARE_PROVIDER_SITE_OTHER): Payer: Self-pay

## 2023-08-21 LAB — VAS US ABI WITH/WO TBI: Right ABI: 0.84

## 2023-08-21 NOTE — Telephone Encounter (Signed)
Austin [name] from [facility] care was notified with medical recommendations and verbalized she will informed provider with information.

## 2023-08-21 NOTE — Telephone Encounter (Signed)
Austin [name] from [facility] left a message informing that the patient is currently taking aspirin 81 mg, clopidogrel 75 mg and eliquis 2.5 mg. She reaching out to see if patient should continue taking all three medications. Please Advise.

## 2023-08-21 NOTE — Telephone Encounter (Signed)
Yes he should continue.

## 2023-08-25 ENCOUNTER — Ambulatory Visit: Payer: 59

## 2023-08-25 NOTE — Progress Notes (Signed)
Subjective:    Patient ID: Austin [name], male    DOB: 14-May-1956, 67 y.o.   MRN: [id number] Chief Complaint  Patient presents with   Follow-up    LS GS 2023. abi/lea/consult. pvd. stephenson.    The patient returns to the office for followup regarding atherosclerotic changes of the lower extremities and review of the noninvasive studies.   There have been no interval changes in lower extremity symptoms. No interval shortening of the patient's claudication distance or development of rest pain symptoms. No new ulcers or wounds have occurred since the last visit.  There have been no significant changes to the patient's overall health care.  The patient denies amaurosis fugax or recent TIA symptoms. There are no documented recent neurological changes noted. There is no history of DVT, PE or superficial thrombophlebitis. The patient denies recent episodes of angina or shortness of breath.   ABI Rt=0.84 and Lt=bka  (previous ABI's Rt=0.77 and Lt=bka) Duplex ultrasound of the the patient has a patent bypass graft however there are elevated velocities near the distal graft of 50 to 74%    Review of Systems  Musculoskeletal:  Positive for gait problem.       Objective:   Physical Exam Vitals reviewed.  HENT:     Head: Normocephalic.  Cardiovascular:     Rate and Rhythm: Normal rate.     Pulses:          Dorsalis pedis pulses are detected w/ Doppler on the right side.       Posterior tibial pulses are detected w/ Doppler on the right side.  Pulmonary:     Effort: Pulmonary effort is normal.  Skin:    General: Skin is warm and dry.  Neurological:     Mental Status: He is alert and oriented to person, place, and time.  Psychiatric:        Mood and Affect: Mood normal.        Behavior: Behavior normal.        Thought Content: Thought content normal.        Judgment: Judgment normal.     BP 125/72 (BP Location: Left Arm)   Pulse 82   Resp 18   Ht 6\' 4"  (1.93 m)   Wt  250 lb (113.4 kg)   BMI 30.43 kg/m   Past Medical History:  Diagnosis Date   Coronary artery disease    Diabetes (HCC)    DVT (deep venous thrombosis) (HCC)    Gangrene of left foot (HCC) 02/09/2018   GERD (gastroesophageal reflux disease)    Hyperlipidemia    Hypertension    Peripheral vascular disease (HCC)     Social History   Socioeconomic History   Marital status: Divorced    Spouse name: Not on file   Number of children: Not on file   Years of education: Not on file   Highest education level: Not on file  Occupational History   Not on file  Tobacco Use   Smoking status: Former    Current packs/day: 0.00    Average packs/day: 1 pack/day for 47.0 years (47.0 ttl pk-yrs)    Types: Cigarettes    Start date: 01/1974    Quit date: 01/2021    Years since quitting: 2.6   Smokeless tobacco: Never  Vaping Use   Vaping status: Never Used  Substance and Sexual Activity   Alcohol use: No   Drug use: No   Sexual activity: Not Currently  Other Topics  Concern   Not on file  Social History Narrative   Not on file   Social Determinants of Health   Financial Resource Strain: Not on file  Food Insecurity: No Food Insecurity (06/25/2023)   Hunger Vital Sign    Worried About Running Out of Food in the Last Year: Never true    Ran Out of Food in the Last Year: Never true  Transportation Needs: No Transportation Needs (06/25/2023)   PRAPARE - Administrator, Civil Service (Medical): No    Lack of Transportation (Non-Medical): No  Physical Activity: Not on file  Stress: Not on file  Social Connections: Not on file  Intimate Partner Violence: Not At Risk (06/25/2023)   Humiliation, Afraid, Rape, and Kick questionnaire    Fear of Current or Ex-Partner: No    Emotionally Abused: No    Physically Abused: No    Sexually Abused: No    Past Surgical History:  Procedure Laterality Date   ABDOMINAL AORTOGRAM W/LOWER EXTREMITY N/A 02/20/2022   Procedure: ABDOMINAL  AORTOGRAM W/LOWER EXTREMITY;  Surgeon: Cephus Shelling, MD;  [facility]: [facility];  Service: Cardiovascular;  Laterality: N/A;   AMPUTATION Left 10/28/2018   Procedure: AMPUTATION BELOW KNEE;  Surgeon: Annice Needy, MD;  Location: [facility];  Service: Vascular;  Laterality: Left;   AMPUTATION Left 09/10/2021   Procedure: AMPUTATION DIGIT;  Surgeon: Allena Napoleon, MD;  Location: [facility];  Service: Plastics;  Laterality: Left;   AMPUTATION Left 09/14/2021   Procedure: AMPUTATION DIGIT left long finger and irrigation and debridement;  Surgeon: Allena Napoleon, MD;  Location: [facility];  Service: Plastics;  Laterality: Left;   AMPUTATION Right 02/27/2022   Procedure: RIGHT PARTIAL AMPUTATION FOOT;  Surgeon: Felecia Shelling, DPM;  Location: [facility];  Service: Podiatry;  Laterality: Right;   AMPUTATION TOE Left 02/10/2018   Procedure: AMPUTATION TOE;  Surgeon: Linus Galas, DPM;  Location: [facility];  Service: Podiatry;  Laterality: Left;   APPLICATION OF WOUND VAC Left 10/16/2018   Procedure: APPLICATION OF WOUND VAC;  Surgeon: Gwyneth Revels, DPM;  Location: [facility];  Service: Podiatry;  Laterality: Left;   DORSAL SLIT N/A 01/12/2021   Procedure: DORSAL SLIT;  Surgeon: Sondra Come, MD;  Location: [facility];  Service: Urology;  Laterality: N/A;   FEMORAL-TIBIAL BYPASS GRAFT Right 02/25/2022   Procedure: RIGHT LOWER EXTREMITY FEMORAL TO TIBIAL PERONEAL TRUNK BYPASS;  Surgeon: Cephus Shelling, MD;  Location: [facility];  Service: Vascular;  Laterality: Right;  INSERT ARTERIAL LINE   groin surgery     IRRIGATION AND DEBRIDEMENT FOOT Left 09/30/2018   Procedure: IRRIGATION AND DEBRIDEMENT FOOT;  Surgeon: Linus Galas, DPM;  Location: [facility];  Service: Podiatry;  Laterality: Left;   LOWER EXTREMITY ANGIOGRAPHY Left 02/12/2018   Procedure: Lower Extremity Angiography;  Surgeon: Annice Needy, MD;  Location: [facility];  Service: Cardiovascular;  Laterality: Left;   LOWER EXTREMITY ANGIOGRAPHY Left  10/01/2018   Procedure: Lower Extremity Angiography;  Surgeon: Annice Needy, MD;  Location: [facility];  Service: Cardiovascular;  Laterality: Left;   LOWER EXTREMITY ANGIOGRAPHY Left 10/19/2018   Procedure: Lower Extremity Angiography;  Surgeon: Annice Needy, MD;  Location: [facility];  Service: Cardiovascular;  Laterality: Left;   LOWER EXTREMITY ANGIOGRAPHY Left 10/20/2018   Procedure: LOWER EXTREMITY ANGIOGRAPHY;  Surgeon: Annice Needy, MD;  Location: [facility];  Service: Cardiovascular;  Laterality: Left;  LOWER EXTREMITY ANGIOGRAPHY Right 11/25/2018   Procedure: LOWER EXTREMITY ANGIOGRAPHY;  Surgeon: Annice Needy, MD;  Location: [facility];  Service: Cardiovascular;  Laterality: Right;   LOWER EXTREMITY ANGIOGRAPHY Right 01/10/2020   Procedure: LOWER EXTREMITY ANGIOGRAPHY;  Surgeon: Annice Needy, MD;  Location: [facility];  Service: Cardiovascular;  Laterality: Right;   LOWER EXTREMITY ANGIOGRAPHY Right 12/11/2020   Procedure: LOWER EXTREMITY ANGIOGRAPHY;  Surgeon: Annice Needy, MD;  Location: [facility];  Service: Cardiovascular;  Laterality: Right;   LOWER EXTREMITY ANGIOGRAPHY Right 06/26/2023   Procedure: Lower Extremity Angiography;  Surgeon: Renford Dills, MD;  Location: [facility];  Service: Cardiovascular;  Laterality: Right;   LOWER EXTREMITY INTERVENTION  02/12/2018   Procedure: LOWER EXTREMITY INTERVENTION;  Surgeon: Annice Needy, MD;  Location: [facility];  Service: Cardiovascular;;   MINOR IRRIGATION AND DEBRIDEMENT OF WOUND Left 09/10/2021   Procedure: IRRIGATION AND DEBRIDEMENT OF LEFT LONG FINGER WOUND;  Surgeon: Allena Napoleon, MD;  Location: [facility];  Service: Plastics;  Laterality: Left;   NECK SURGERY     PERIPHERAL VASCULAR INTERVENTION  02/20/2022   Procedure: PERIPHERAL VASCULAR INTERVENTION;  Surgeon: Cephus Shelling, MD;  [facility];  Service: Cardiovascular;;  right  iliac   TRANSMETATARSAL AMPUTATION Left 10/16/2018   Procedure: TRANSMETATARSAL AMPUTATION LEFT FOOT;  Surgeon: Gwyneth Revels, DPM;  Location: [facility];  Service: Podiatry;  Laterality: Left;   VEIN HARVEST Right 02/25/2022   Procedure: VEIN HARVEST OF RIGHT GREATER SAPHENOUS VEIN;  Surgeon: Cephus Shelling, MD;  Location: [facility];  Service: Vascular;  Laterality: Right;    Family History  Problem Relation Age of Onset   Diabetes Mother    Coronary artery disease Father    Hypertension Sister    Leukemia Brother     No Known Allergies     Latest Ref Rng & Units 08/06/2023    5:30 PM 06/30/2023    4:29 AM 06/26/2023    4:17 AM  CBC  WBC 4.0 - 10.5 K/uL 9.3  9.4  10.1   Hemoglobin 13.0 - 17.0 g/dL 19.1  47.8  29.5   Hematocrit 39.0 - 52.0 % 42.0  44.9  41.0   Platelets 150 - 400 K/uL 219  188  204       CMP     Component Value Date/Time   NA 141 08/06/2023 1730   NA 135 (L) 11/17/2013 0621   K 4.1 08/06/2023 1730   K 4.3 11/17/2013 0621   CL 110 08/06/2023 1730   CL 99 11/17/2013 0621   CO2 24 08/06/2023 1730   CO2 31 11/17/2013 0621   GLUCOSE 180 (H) 08/06/2023 1730   GLUCOSE 172 (H) 11/17/2013 0621   BUN 15 08/06/2023 1730   BUN 10 11/17/2013 0621   CREATININE 0.74 08/06/2023 1730   CREATININE 1.05 04/18/2022 1034   CALCIUM 9.1 08/06/2023 1730   CALCIUM 9.6 11/17/2013 0621   PROT 8.1 06/25/2023 1610   PROT 8.5 (H) 01/10/2013 2155   ALBUMIN 4.2 06/25/2023 1610   ALBUMIN 4.0 01/10/2013 2155   AST 26 06/25/2023 1610   AST 16 01/10/2013 2155   ALT 20 06/25/2023 1610   ALT 26 01/10/2013 2155   ALKPHOS 87 06/25/2023 1610   ALKPHOS 91 01/10/2013 2155   BILITOT 1.2 06/25/2023 1610   BILITOT 0.3 01/10/2013 2155   EGFR 78 04/18/2022 1034   GFRNONAA >60 08/06/2023 1730  GFRNONAA >60 11/17/2013 0621     VAS [place] ABI WITH/WO TBI  Result Date: 08/21/2023  LOWER EXTREMITY DOPPLER STUDY Patient Name:  Austin [name]  Date of Exam:   08/20/2023 Medical Rec #:  [id number]         Accession #:    [id number] Date of Birth: 1955/12/31         Patient Gender: M Patient Age:   79 years Exam Location:  [facility] Procedure:      VAS [place] ABI WITH/WO TBI Referring Phys: Barbara Cower DEW --------------------------------------------------------------------------------  Indications: Peripheral artery disease. High Risk Factors: Hypertension, Diabetes, past history of smoking.  Vascular Interventions: 06/26/2023 PTA of tib per trunk and mid fem to TP trunk                         bypass graft on the right.                          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 Performing Technologist: Hardie Lora RVT  Examination Guidelines: A complete evaluation includes at minimum, Doppler waveform signals and systolic blood pressure reading at the level of bilateral brachial, anterior tibial, and posterior tibial arteries, when vessel segments are accessible. Bilateral testing is considered an integral part of a complete examination. Photoelectric Plethysmograph (PPG) waveforms and toe systolic pressure readings are included as required and additional duplex testing as needed. Limited examinations for reoccurring indications may be performed as noted.  ABI Findings: +---------+------------------+-----+--------+--------+ Right    Rt Pressure (mmHg)IndexWaveformComment  +---------+------------------+-----+--------+--------+ Brachial 136                                     +---------+------------------+-----+--------+--------+ PTA      0                 0.00 absent            +---------+------------------+-----+--------+--------+ PERO     111               0.78 biphasic         +---------+------------------+-----+--------+--------+ DP       119               0.84 biphasic         +---------+------------------+-----+--------+--------+ Kirke Shaggy  0.73                  +---------+------------------+-----+--------+--------+ +--------+------------------+-----+--------+-------+ Left    Lt Pressure (mmHg)IndexWaveformComment +--------+------------------+-----+--------+-------+ [id number]                                    +--------+------------------+-----+--------+-------+ +-------+-----------+-----------+------------+------------+ ABI/TBIToday's ABIToday's TBIPrevious ABIPrevious TBI +-------+-----------+-----------+------------+------------+ Right  0.84       0.73       0.77        0.30         +-------+-----------+-----------+------------+------------+ Left   BKA                   BKA                      +-------+-----------+-----------+------------+------------+  Right ABIs appear essentially unchanged compared to prior study on 10/15/2021.  Summary: Right: Resting right ankle-brachial index indicates mild right lower extremity arterial disease. The right toe-brachial index is normal. *See table(s) above for measurements and observations.  Electronically signed by [name] MD on 08/21/2023 at 3:32:45 PM.    Final        Assessment & Plan:   1. Atherosclerosis of native arteries of the extremities with ulceration (HCC) Recommend:  The patient is status post successful angiogram with intervention.  The patient reports that the claudication symptoms and leg pain has improved.   The patient denies lifestyle limiting changes at this point in time.  No further invasive studies, angiography or surgery at this time. The patient should continue walking and begin a more formal exercise program.  The patient should  continue antiplatelet therapy and aggressive treatment of the lipid abnormalities  Continued surveillance is indicated as atherosclerosis is likely to progress with time.    Patient should undergo noninvasive studies as ordered. The patient will follow up with me to review the studies.   2. Type 2 diabetes mellitus with diabetic peripheral angiopathy without gangrene, with long-term current use of insulin (HCC) Continue hypoglycemic medications as already ordered, these medications have been reviewed and there are no changes at this time.  Hgb A1C to be monitored as already arranged by primary service  3. Essential hypertension Continue antihypertensive medications as already ordered, these medications have been reviewed and there are no changes at this time.   Current Outpatient Medications on File Prior to Visit  Medication Sig Dispense Refill   apixaban (ELIQUIS) 2.5 MG TABS tablet Take 1 tablet (2.5 mg total) by mouth 2 (two) times daily.     ascorbic acid (VITAMIN C) 500 MG tablet Take 1 tablet (500 mg total) by mouth 2 (two) times daily. 60 tablet 0   aspirin EC 81 MG tablet Take 1 tablet (81 mg total) by mouth daily. 150 tablet 2   atorvastatin (LIPITOR) 80 MG tablet Take 1 tablet (80 mg total) by mouth daily at 6 PM. 30 tablet 0   Cholecalciferol (VITAMIN D3) 125 MCG (5000 UT) CAPS Take 1 capsule by mouth daily.     clopidogrel (PLAVIX) 75 MG tablet Take 1 tablet (75 mg total) by mouth daily at 6 (six) AM. 10 tablet 0   diclofenac Sodium (VOLTAREN) 1 % GEL Apply 4 g topically 4 (four) times daily.     gabapentin (NEURONTIN) 300 MG capsule Take 1 capsule (300 mg total) by mouth 3 (three) times daily.     insulin glargine (LANTUS) 100  UNIT/ML Solostar Pen Inject 41 Units into the skin 2 (two) times daily. (Patient taking differently: Inject 40 Units into the skin at bedtime.) 15 mL 0   insulin lispro (HUMALOG) 100 UNIT/ML injection Inject 13 Units into the skin 3 (three) times daily  before meals.     losartan (COZAAR) 25 MG tablet Take 1 tablet (25 mg total) by mouth daily. 30 tablet 0   magnesium oxide (MAG-OX) 400 MG tablet Take 0.5 tablets (200 mg total) by mouth daily. 30 tablet 0   methocarbamol (ROBAXIN) 500 MG tablet Take 1 tablet (500 mg total) by mouth every 6 (six) hours as needed for muscle spasms. 15 tablet 0   oxyCODONE-acetaminophen (PERCOCET/ROXICET) 5-325 MG tablet Take 1 tablet by mouth every 4 (four) hours as needed for severe pain (pain score 7-10). 15 tablet 0   polyethylene glycol powder (GLYCOLAX/MIRALAX) 17 GM/SCOOP powder Take 34 g by mouth daily. 765 g 0   senna-docusate (SENOKOT-S) 8.6-50 MG tablet Take 1 tablet by mouth 2 (two) times daily.     TRULICITY 0.75 MG/0.5ML SOPN Inject into the skin.     Zinc Sulfate 220 (50 Zn) MG TABS Take 1 tablet (220 mg total) by mouth daily after supper. 30 tablet 0   acidophilus (RISAQUAD) CAPS capsule Take 1 capsule by mouth daily after supper. (Patient not taking: Reported on 07/15/2023) 60 capsule 1   NICORETTE STARTER KIT 4 MG gum Take 4 mg by mouth as needed for smoking cessation. (Patient not taking: Reported on 07/15/2023)     No current facility-administered medications on file prior to visit.    There are no Patient Instructions on file for this visit. No follow-ups on file.   Georgiana Spinner, NP

## 2023-09-26 ENCOUNTER — Other Ambulatory Visit (INDEPENDENT_AMBULATORY_CARE_PROVIDER_SITE_OTHER): Payer: Self-pay | Admitting: Nurse Practitioner

## 2023-09-26 ENCOUNTER — Telehealth (INDEPENDENT_AMBULATORY_CARE_PROVIDER_SITE_OTHER): Payer: Self-pay

## 2023-09-26 ENCOUNTER — Other Ambulatory Visit (INDEPENDENT_AMBULATORY_CARE_PROVIDER_SITE_OTHER): Payer: Self-pay

## 2023-09-26 MED ORDER — CLOPIDOGREL BISULFATE 75 MG PO TABS: 75.0000 mg | ORAL_TABLET | Freq: Every day | ORAL | 1 refills | Status: DC

## 2023-09-26 NOTE — Telephone Encounter (Signed)
[employer]. I sent 60 qty 1 refill 1 time a day.

## 2023-09-26 NOTE — Telephone Encounter (Signed)
Patient and Irving Burton from [facility] called stating that he needs a refill on Plavix. Patient had a 10 qty with 0 refills back on 03/21/22.  According to Dr. Wyn Quaker note on 07/31/23 he should be taking Plavix, Eliquis, and a aspirin along with Lipitor. .   Please advise

## 2023-09-26 NOTE — Telephone Encounter (Signed)
Yes he will need a refill.  Can we clarify which pharmacy this is ? Thanks

## 2023-10-06 ENCOUNTER — Telehealth (INDEPENDENT_AMBULATORY_CARE_PROVIDER_SITE_OTHER): Payer: Self-pay

## 2023-10-06 NOTE — Telephone Encounter (Signed)
It is hard to predict how long he may be on it, but I can say that he will be on at least until his next follow-up visit on February 13

## 2023-10-06 NOTE — Telephone Encounter (Signed)
 [facility] called to see how long will the patient be on both Plavix and Eliquis. They are trying to create him a bubble pack for his medications and wanted to be sure he will be on it for as long as they put it in there.   Please advise.

## 2023-10-07 NOTE — Telephone Encounter (Signed)
Message given

## 2023-11-03 ENCOUNTER — Inpatient Hospital Stay: Payer: 59

## 2023-11-03 ENCOUNTER — Other Ambulatory Visit: Payer: Self-pay

## 2023-11-03 ENCOUNTER — Inpatient Hospital Stay
Admission: EM | Admit: 2023-11-03 | Discharge: 2023-11-10 | DRG: 240 | Disposition: A | Discharge status: Home / Self Care | Payer: 59 | Attending: Hospitalist | Admitting: Hospitalist

## 2023-11-03 ENCOUNTER — Emergency Department: Payer: 59

## 2023-11-03 DIAGNOSIS — Z7982 Long term (current) use of aspirin: Secondary | ICD-10-CM | POA: Diagnosis not present

## 2023-11-03 DIAGNOSIS — I70261 Atherosclerosis of native arteries of extremities with gangrene, right leg: Secondary | ICD-10-CM | POA: Diagnosis not present

## 2023-11-03 DIAGNOSIS — Z833 Family history of diabetes mellitus: Secondary | ICD-10-CM | POA: Diagnosis not present

## 2023-11-03 DIAGNOSIS — E1151 Type 2 diabetes mellitus with diabetic peripheral angiopathy without gangrene: Secondary | ICD-10-CM | POA: Diagnosis present

## 2023-11-03 DIAGNOSIS — Z86718 Personal history of other venous thrombosis and embolism: Secondary | ICD-10-CM

## 2023-11-03 DIAGNOSIS — Z794 Long term (current) use of insulin: Secondary | ICD-10-CM

## 2023-11-03 DIAGNOSIS — I70235 Atherosclerosis of native arteries of right leg with ulceration of other part of foot: Secondary | ICD-10-CM | POA: Diagnosis not present

## 2023-11-03 DIAGNOSIS — Z7901 Long term (current) use of anticoagulants: Secondary | ICD-10-CM

## 2023-11-03 DIAGNOSIS — E1169 Type 2 diabetes mellitus with other specified complication: Secondary | ICD-10-CM | POA: Diagnosis present

## 2023-11-03 DIAGNOSIS — E1152 Type 2 diabetes mellitus with diabetic peripheral angiopathy with gangrene: Principal | ICD-10-CM | POA: Diagnosis present

## 2023-11-03 DIAGNOSIS — Y832 Surgical operation with anastomosis, bypass or graft as the cause of abnormal reaction of the patient, or of later complication, without mention of misadventure at the time of the procedure: Secondary | ICD-10-CM | POA: Diagnosis present

## 2023-11-03 DIAGNOSIS — M1711 Unilateral primary osteoarthritis, right knee: Secondary | ICD-10-CM | POA: Diagnosis present

## 2023-11-03 DIAGNOSIS — Z7902 Long term (current) use of antithrombotics/antiplatelets: Secondary | ICD-10-CM | POA: Diagnosis not present

## 2023-11-03 DIAGNOSIS — Z87891 Personal history of nicotine dependence: Secondary | ICD-10-CM | POA: Diagnosis not present

## 2023-11-03 DIAGNOSIS — I482 Chronic atrial fibrillation, unspecified: Secondary | ICD-10-CM | POA: Diagnosis present

## 2023-11-03 DIAGNOSIS — Z89512 Acquired absence of left leg below knee: Secondary | ICD-10-CM

## 2023-11-03 DIAGNOSIS — I2510 Atherosclerotic heart disease of native coronary artery without angina pectoris: Secondary | ICD-10-CM | POA: Diagnosis present

## 2023-11-03 DIAGNOSIS — Z9889 Other specified postprocedural states: Secondary | ICD-10-CM | POA: Diagnosis not present

## 2023-11-03 DIAGNOSIS — E663 Overweight: Secondary | ICD-10-CM | POA: Insufficient documentation

## 2023-11-03 DIAGNOSIS — E119 Type 2 diabetes mellitus without complications: Secondary | ICD-10-CM

## 2023-11-03 DIAGNOSIS — I70335 Atherosclerosis of unspecified type of bypass graft(s) of the right leg with ulceration of other part of foot: Secondary | ICD-10-CM | POA: Diagnosis not present

## 2023-11-03 DIAGNOSIS — I739 Peripheral vascular disease, unspecified: Secondary | ICD-10-CM | POA: Diagnosis present

## 2023-11-03 DIAGNOSIS — E11621 Type 2 diabetes mellitus with foot ulcer: Secondary | ICD-10-CM | POA: Diagnosis present

## 2023-11-03 DIAGNOSIS — L97519 Non-pressure chronic ulcer of other part of right foot with unspecified severity: Secondary | ICD-10-CM | POA: Diagnosis present

## 2023-11-03 DIAGNOSIS — E785 Hyperlipidemia, unspecified: Secondary | ICD-10-CM | POA: Diagnosis present

## 2023-11-03 DIAGNOSIS — Z6828 Body mass index (BMI) 28.0-28.9, adult: Secondary | ICD-10-CM

## 2023-11-03 DIAGNOSIS — T82898A Other specified complication of vascular prosthetic devices, implants and grafts, initial encounter: Secondary | ICD-10-CM | POA: Diagnosis present

## 2023-11-03 DIAGNOSIS — Z79899 Other long term (current) drug therapy: Secondary | ICD-10-CM | POA: Diagnosis not present

## 2023-11-03 DIAGNOSIS — Z8249 Family history of ischemic heart disease and other diseases of the circulatory system: Secondary | ICD-10-CM | POA: Diagnosis not present

## 2023-11-03 DIAGNOSIS — I10 Essential (primary) hypertension: Secondary | ICD-10-CM | POA: Diagnosis present

## 2023-11-03 DIAGNOSIS — I96 Gangrene, not elsewhere classified: Secondary | ICD-10-CM | POA: Diagnosis present

## 2023-11-03 DIAGNOSIS — Z89519 Acquired absence of unspecified leg below knee: Secondary | ICD-10-CM

## 2023-11-03 DIAGNOSIS — Z1152 Encounter for screening for COVID-19: Secondary | ICD-10-CM | POA: Diagnosis not present

## 2023-11-03 DIAGNOSIS — J069 Acute upper respiratory infection, unspecified: Secondary | ICD-10-CM | POA: Diagnosis present

## 2023-11-03 DIAGNOSIS — M868X7 Other osteomyelitis, ankle and foot: Secondary | ICD-10-CM | POA: Diagnosis present

## 2023-11-03 DIAGNOSIS — K219 Gastro-esophageal reflux disease without esophagitis: Secondary | ICD-10-CM | POA: Diagnosis present

## 2023-11-03 DIAGNOSIS — Z7985 Long-term (current) use of injectable non-insulin antidiabetic drugs: Secondary | ICD-10-CM | POA: Diagnosis not present

## 2023-11-03 DIAGNOSIS — E1165 Type 2 diabetes mellitus with hyperglycemia: Secondary | ICD-10-CM | POA: Diagnosis present

## 2023-11-03 DIAGNOSIS — Z95828 Presence of other vascular implants and grafts: Secondary | ICD-10-CM | POA: Diagnosis not present

## 2023-11-03 DIAGNOSIS — Z806 Family history of leukemia: Secondary | ICD-10-CM

## 2023-11-03 DIAGNOSIS — Z89022 Acquired absence of left finger(s): Secondary | ICD-10-CM

## 2023-11-03 LAB — COMPREHENSIVE METABOLIC PANEL
ALT: 22 U/L (ref 0–44)
AST: 26 U/L (ref 15–41)
Albumin: 3.9 g/dL (ref 3.5–5.0)
Alkaline Phosphatase: 77 U/L (ref 38–126)
Anion gap: 12 (ref 5–15)
BUN: 11 mg/dL (ref 8–23)
CO2: 25 mmol/L (ref 22–32)
Calcium: 9 mg/dL (ref 8.9–10.3)
Chloride: 100 mmol/L (ref 98–111)
Creatinine, Ser: 0.89 mg/dL (ref 0.61–1.24)
GFR, Estimated: >60 mL/min (ref >60)
Glucose, Bld: 77 mg/dL (ref 70–99)
Potassium: 3.5 mmol/L (ref 3.5–5.1)
Sodium: 137 mmol/L (ref 135–145)
Total Bilirubin: 0.8 mg/dL (ref 0.0–1.2)
Total Protein: 7.7 g/dL (ref 6.5–8.1)

## 2023-11-03 LAB — CBC WITH DIFFERENTIAL/PLATELET
Abs Immature Granulocytes: 0.03 10*3/uL (ref 0.00–0.07)
Basophils Absolute: 0 10*3/uL (ref 0.0–0.1)
Basophils Relative: 0 %
Eosinophils Absolute: 0 10*3/uL (ref 0.0–0.5)
Eosinophils Relative: 0 %
HCT: 44.3 % (ref 39.0–52.0)
Hemoglobin: 14.6 g/dL (ref 13.0–17.0)
Immature Granulocytes: 0 %
Lymphocytes Relative: 28 %
Lymphs Abs: 2.1 10*3/uL (ref 0.7–4.0)
MCH: 28 pg (ref 26.0–34.0)
MCHC: 33 g/dL (ref 30.0–36.0)
MCV: 85 fL (ref 80.0–100.0)
Monocytes Absolute: 1.1 10*3/uL — ABNORMAL HIGH (ref 0.1–1.0)
Monocytes Relative: 15 %
Neutro Abs: 4.2 10*3/uL (ref 1.7–7.7)
Neutrophils Relative %: 57 %
Platelets: 202 10*3/uL (ref 150–400)
RBC: 5.21 MIL/uL (ref 4.22–5.81)
RDW: 14.8 % (ref 11.5–15.5)
WBC: 7.4 10*3/uL (ref 4.0–10.5)
nRBC: 0 % (ref 0.0–0.2)

## 2023-11-03 LAB — GLUCOSE, CAPILLARY: Glucose-Capillary: 237 mg/dL — ABNORMAL HIGH (ref 70–99)

## 2023-11-03 LAB — CBG MONITORING, ED: Glucose-Capillary: 262 mg/dL — ABNORMAL HIGH (ref 70–99)

## 2023-11-03 LAB — LACTIC ACID, PLASMA: Lactic Acid, Venous: 1 mmol/L (ref 0.5–1.9)

## 2023-11-03 MED ORDER — INSULIN ASPART 100 UNIT/ML IJ SOLN
6.0000 [IU] | Freq: Three times a day (TID) | INTRAMUSCULAR | Status: DC
  Administered 2023-11-04 – 2023-11-10 (×18): 6 [IU] via SUBCUTANEOUS
  Filled 2023-11-03 (×18): qty 1

## 2023-11-03 MED ORDER — CLOPIDOGREL BISULFATE 75 MG PO TABS
75.0000 mg | ORAL_TABLET | Freq: Every day | ORAL | Status: DC
  Administered 2023-11-04 – 2023-11-10 (×7): 75 mg via ORAL
  Filled 2023-11-03 (×7): qty 1

## 2023-11-03 MED ORDER — GABAPENTIN 100 MG PO CAPS
100.0000 mg | ORAL_CAPSULE | Freq: Three times a day (TID) | ORAL | Status: DC
  Administered 2023-11-03 – 2023-11-10 (×20): 100 mg via ORAL
  Filled 2023-11-03 (×20): qty 1

## 2023-11-03 MED ORDER — OXYCODONE-ACETAMINOPHEN 5-325 MG PO TABS
1.0000 | ORAL_TABLET | ORAL | Status: DC | PRN
  Administered 2023-11-03 – 2023-11-10 (×9): 1 via ORAL
  Filled 2023-11-03 (×9): qty 1

## 2023-11-03 MED ORDER — INSULIN GLARGINE-YFGN 100 UNIT/ML ~~LOC~~ SOLN
40.0000 [IU] | Freq: Every day | SUBCUTANEOUS | Status: DC
  Administered 2023-11-03 – 2023-11-09 (×7): 40 [IU] via SUBCUTANEOUS
  Filled 2023-11-03 (×8): qty 0.4

## 2023-11-03 MED ORDER — FLUTICASONE PROPIONATE 50 MCG/ACT NA SUSP
1.0000 | Freq: Every day | NASAL | Status: DC
  Administered 2023-11-03 – 2023-11-10 (×8): 1 via NASAL
  Filled 2023-11-03 (×2): qty 16

## 2023-11-03 MED ORDER — ATORVASTATIN CALCIUM 20 MG PO TABS
80.0000 mg | ORAL_TABLET | Freq: Every day | ORAL | Status: DC
  Administered 2023-11-03 – 2023-11-09 (×7): 80 mg via ORAL
  Filled 2023-11-03 (×7): qty 4

## 2023-11-03 MED ORDER — ENOXAPARIN SODIUM 60 MG/0.6ML IJ SOSY
55.0000 mg | PREFILLED_SYRINGE | INTRAMUSCULAR | Status: DC
  Administered 2023-11-03 – 2023-11-06 (×4): 55 mg via SUBCUTANEOUS
  Filled 2023-11-03 (×4): qty 0.6

## 2023-11-03 MED ORDER — INSULIN ASPART 100 UNIT/ML IJ SOLN
0.0000 [IU] | Freq: Three times a day (TID) | INTRAMUSCULAR | Status: DC
  Administered 2023-11-04: 2 [IU] via SUBCUTANEOUS
  Administered 2023-11-04 (×2): 3 [IU] via SUBCUTANEOUS
  Administered 2023-11-05: 2 [IU] via SUBCUTANEOUS
  Administered 2023-11-05: 3 [IU] via SUBCUTANEOUS
  Administered 2023-11-06: 2 [IU] via SUBCUTANEOUS
  Administered 2023-11-06: 5 [IU] via SUBCUTANEOUS
  Administered 2023-11-07 (×2): 3 [IU] via SUBCUTANEOUS
  Administered 2023-11-07: 2 [IU] via SUBCUTANEOUS
  Administered 2023-11-08 – 2023-11-09 (×5): 3 [IU] via SUBCUTANEOUS
  Administered 2023-11-09: 2 [IU] via SUBCUTANEOUS
  Administered 2023-11-10: 3 [IU] via SUBCUTANEOUS
  Filled 2023-11-03 (×17): qty 1

## 2023-11-03 MED ORDER — ASPIRIN 81 MG PO TBEC
81.0000 mg | DELAYED_RELEASE_TABLET | Freq: Every day | ORAL | Status: DC
  Administered 2023-11-04 – 2023-11-10 (×6): 81 mg via ORAL
  Filled 2023-11-03 (×6): qty 1

## 2023-11-03 MED ORDER — LOSARTAN POTASSIUM 25 MG PO TABS
25.0000 mg | ORAL_TABLET | Freq: Every day | ORAL | Status: DC
  Administered 2023-11-03 – 2023-11-10 (×8): 25 mg via ORAL
  Filled 2023-11-03 (×8): qty 1

## 2023-11-03 MED ORDER — GUAIFENESIN 100 MG/5ML PO LIQD
5.0000 mL | ORAL | Status: DC | PRN
  Administered 2023-11-03 – 2023-11-09 (×7): 5 mL via ORAL
  Filled 2023-11-03 (×7): qty 10

## 2023-11-03 NOTE — ED Notes (Signed)
Ultrasound at bedside

## 2023-11-03 NOTE — ED Triage Notes (Signed)
Patient was sent over from [facility] for concern over the 4th toe on the right foot. Patient denied pain from the toe. Toe appears to be discolored. Right foot is swollen and warm to touch. Patient is a Type 2 diabetic.

## 2023-11-03 NOTE — H&P (Addendum)
History and Physical    Patient: Austin [name] DOB: 04/03/1956 DOA: 11/03/2023 DOS: the patient was seen and examined on 11/03/2023 PCP: Preston Fleeting, MD  Patient coming from: Home  Chief Complaint:  Chief Complaint  Patient presents with   Toe Injury   HPI: Austin [name] is a 68 y.o. male with medical history significant of type 2 diabetes, coronary disease, history of DVT, peripheral vascular disease, essential hypertension, dyslipidemia, who present to the hospital with right fifth toe gangrene.  Patient has right leg swelling, right foot pain for a long while.  Condition getting worse now.  When he came to the hospital, the fifth toe looks necrotic.  ED has spoken with podiatry, need amputation as well as vascular intervention.  Patient also had respiratory symptoms for 2-3 days with cough, nasal congestion. No fever or shortness of breath   Review of Systems: As mentioned in the history of present illness. All other systems reviewed and are negative. Past Medical History:  Diagnosis Date   Coronary artery disease    Diabetes (HCC)    DVT (deep venous thrombosis) (HCC)    Gangrene of left foot (HCC) 02/09/2018   GERD (gastroesophageal reflux disease)    Hyperlipidemia    Hypertension    Peripheral vascular disease (HCC)    Past Surgical History:  Procedure Laterality Date   ABDOMINAL AORTOGRAM W/LOWER EXTREMITY N/A 02/20/2022   Procedure: ABDOMINAL AORTOGRAM W/LOWER EXTREMITY;  Surgeon: Cephus Shelling, MD;  Location: [facility];  Service: Cardiovascular;  Laterality: N/A;   AMPUTATION Left 10/28/2018   Procedure: AMPUTATION BELOW KNEE;  Surgeon: Annice Needy, MD;  Location: [facility];  Service: Vascular;  Laterality: Left;   AMPUTATION Left 09/10/2021   Procedure: AMPUTATION DIGIT;  Surgeon: Allena Napoleon, MD;  Location: [facility];  Service: Plastics;  Laterality: Left;   AMPUTATION Left 09/14/2021   Procedure: AMPUTATION DIGIT left  long finger and irrigation and debridement;  Surgeon: Allena Napoleon, MD;  Location: [facility];  Service: Plastics;  Laterality: Left;   AMPUTATION Right 02/27/2022   Procedure: RIGHT PARTIAL AMPUTATION FOOT;  Surgeon: Felecia Shelling, DPM;  Location: [facility];  Service: Podiatry;  Laterality: Right;   AMPUTATION TOE Left 02/10/2018   Procedure: AMPUTATION TOE;  Surgeon: Linus Galas, DPM;  Location: [facility];  Service: Podiatry;  Laterality: Left;   APPLICATION OF WOUND VAC Left 10/16/2018   Procedure: APPLICATION OF WOUND VAC;  Surgeon: Gwyneth Revels, DPM;  Location: [facility];  Service: Podiatry;  Laterality: Left;   DORSAL SLIT N/A 01/12/2021   Procedure: DORSAL SLIT;  Surgeon: Sondra Come, MD;  Location: [facility];  Service: Urology;  Laterality: N/A;   FEMORAL-TIBIAL BYPASS GRAFT Right 02/25/2022   Procedure: RIGHT LOWER EXTREMITY FEMORAL TO TIBIAL PERONEAL TRUNK BYPASS;  Surgeon: Cephus Shelling, MD;  Location: [facility];  Service: Vascular;  Laterality: Right;  INSERT ARTERIAL LINE   groin surgery     IRRIGATION AND DEBRIDEMENT FOOT Left 09/30/2018   Procedure: IRRIGATION AND DEBRIDEMENT FOOT;  Surgeon: Linus Galas, DPM;  Location: [facility];  Service: Podiatry;  Laterality: Left;   LOWER EXTREMITY ANGIOGRAPHY Left 02/12/2018   Procedure: Lower Extremity Angiography;  Surgeon: Annice Needy, MD;  Location: [facility];  Service: Cardiovascular;  Laterality: Left;   LOWER EXTREMITY ANGIOGRAPHY Left 10/01/2018   Procedure: Lower Extremity Angiography;  Surgeon: Annice Needy, MD;  Location: [facility];  Service: Cardiovascular;  Laterality: Left;   LOWER EXTREMITY ANGIOGRAPHY Left 10/19/2018   Procedure: Lower Extremity Angiography;  Surgeon: Annice Needy, MD;  Location: [facility];  Service: Cardiovascular;  Laterality: Left;   LOWER EXTREMITY ANGIOGRAPHY Left 10/20/2018   Procedure: LOWER EXTREMITY ANGIOGRAPHY;  Surgeon: Annice Needy, MD;  Location: [facility];   Service: Cardiovascular;  Laterality: Left;   LOWER EXTREMITY ANGIOGRAPHY Right 11/25/2018   Procedure: LOWER EXTREMITY ANGIOGRAPHY;  Surgeon: Annice Needy, MD;  Location: [facility];  Service: Cardiovascular;  Laterality: Right;   LOWER EXTREMITY ANGIOGRAPHY Right 01/10/2020   Procedure: LOWER EXTREMITY ANGIOGRAPHY;  Surgeon: Annice Needy, MD;  Location: [facility];  Service: Cardiovascular;  Laterality: Right;   LOWER EXTREMITY ANGIOGRAPHY Right 12/11/2020   Procedure: LOWER EXTREMITY ANGIOGRAPHY;  Surgeon: Annice Needy, MD;  Location: [facility];  Service: Cardiovascular;  Laterality: Right;   LOWER EXTREMITY ANGIOGRAPHY Right 06/26/2023   Procedure: Lower Extremity Angiography;  Surgeon: Renford Dills, MD;  Location: [facility];  Service: Cardiovascular;  Laterality: Right;   LOWER EXTREMITY INTERVENTION  02/12/2018   Procedure: LOWER EXTREMITY INTERVENTION;  Surgeon: Annice Needy, MD;  Location: [facility];  Service: Cardiovascular;;   MINOR IRRIGATION AND DEBRIDEMENT OF WOUND Left 09/10/2021   Procedure: IRRIGATION AND DEBRIDEMENT OF LEFT LONG FINGER WOUND;  Surgeon: Allena Napoleon, MD;  Location: [facility];  Service: Plastics;  Laterality: Left;   NECK SURGERY     PERIPHERAL VASCULAR INTERVENTION  02/20/2022   Procedure: PERIPHERAL VASCULAR INTERVENTION;  Surgeon: Cephus Shelling, MD;  Location: [facility];  Service: Cardiovascular;;  right iliac   TRANSMETATARSAL AMPUTATION Left 10/16/2018   Procedure: TRANSMETATARSAL AMPUTATION LEFT FOOT;  Surgeon: Gwyneth Revels, DPM;  Location: [facility];  Service: Podiatry;  Laterality: Left;   VEIN HARVEST Right 02/25/2022   Procedure: VEIN HARVEST OF RIGHT GREATER SAPHENOUS VEIN;  Surgeon: Cephus Shelling, MD;  Location[facility];  Service: Vascular;  Laterality: Right;   Social History:  reports that he quit smoking about 2 years ago. His smoking use included cigarettes. He started smoking  about 49 years ago. He has a 47 pack-year smoking history. He has never used smokeless tobacco. He reports that he does not drink alcohol and does not use drugs.  No Known Allergies  Family History  Problem Relation Age of Onset   Diabetes Mother    Coronary artery disease Father    Hypertension Sister    Leukemia Brother     Prior to Admission medications   Medication Sig Start Date End Date Taking? Authorizing Provider  acidophilus (RISAQUAD) CAPS capsule Take 1 capsule by mouth daily after supper. Patient not taking: Reported on 07/15/2023 03/21/22   Love, Evlyn Kanner, PA-C  apixaban (ELIQUIS) 2.5 MG TABS tablet Take 1 tablet (2.5 mg total) by mouth 2 (two) times daily. 07/01/23   Tresa Moore, MD  ascorbic acid (VITAMIN C) 500 MG tablet Take 1 tablet (500 mg total) by mouth 2 (two) times daily. 03/21/22   Love, Evlyn Kanner, PA-C  aspirin EC 81 MG tablet Take 1 tablet (81 mg total) by mouth daily. 01/10/20   Annice Needy, MD  atorvastatin (LIPITOR) 80 MG tablet Take 1 tablet (80 mg total) by mouth daily at 6 PM. 03/21/22   Love, Evlyn Kanner, PA-C  Cholecalciferol (VITAMIN D3) 125 MCG (5000 UT) CAPS Take 1 capsule by mouth daily. 04/17/22  [provider]  clopidogrel (PLAVIX) 75 MG tablet Take 1 tablet (75 mg total) by mouth daily at 6 (six) AM. 09/26/23   Georgiana Spinner, NP  diclofenac Sodium (VOLTAREN) 1 % GEL Apply 4 g topically 4 (four) times daily. 07/01/23   Tresa Moore, MD  gabapentin (NEURONTIN) 300 MG capsule Take 1 capsule (300 mg total) by mouth 3 (three) times daily. 07/01/23   Sreenath, Sudheer B, MD  insulin glargine (LANTUS) 100 UNIT/ML Solostar Pen Inject 41 Units into the skin 2 (two) times daily. Patient taking differently: Inject 40 Units into the skin at bedtime. 03/21/22   Love, Evlyn Kanner, PA-C  insulin lispro (HUMALOG) 100 UNIT/ML injection Inject 13 Units into the skin 3 (three) times daily before meals.    [provider]  losartan (COZAAR) 25 MG  tablet Take 1 tablet (25 mg total) by mouth daily. 03/21/22   Love, Evlyn Kanner, PA-C  magnesium oxide (MAG-OX) 400 MG tablet Take 0.5 tablets (200 mg total) by mouth daily. 03/21/22   Love, Evlyn Kanner, PA-C  methocarbamol (ROBAXIN) 500 MG tablet Take 1 tablet (500 mg total) by mouth every 6 (six) hours as needed for muscle spasms. 03/21/22   Love, Evlyn Kanner, PA-C  NICORETTE STARTER KIT 4 MG gum Take 4 mg by mouth as needed for smoking cessation. Patient not taking: Reported on 07/15/2023 02/05/23   [provider]  oxyCODONE-acetaminophen (PERCOCET/ROXICET) 5-325 MG tablet Take 1 tablet by mouth every 4 (four) hours as needed for severe pain (pain score 7-10). 08/07/23   Irean Hong, MD  polyethylene glycol powder (GLYCOLAX/MIRALAX) 17 GM/SCOOP powder Take 34 g by mouth daily. 01/16/23   Brimage, Seward Meth, DO  senna-docusate (SENOKOT-S) 8.6-50 MG tablet Take 1 tablet by mouth 2 (two) times daily. 07/01/23   Tresa Moore, MD  TRULICITY 0.75 MG/0.5ML SOPN Inject into the skin. 03/22/22   [provider]  Zinc Sulfate 220 (50 Zn) MG TABS Take 1 tablet (220 mg total) by mouth daily after supper. 03/21/22   Jacquelynn Cree, PA-C    Physical Exam: Vitals:   11/03/23 1204 11/03/23 1208 11/03/23 1209 11/03/23 1550  BP: (!) 150/69   (!) 154/85  Pulse: 77   89  Resp: 18   16  Temp: 98.8 F (37.1 C)   98 F (36.7 C)  TempSrc: Oral     SpO2: 97% 97%  100%  Weight:   113.4 kg   Height:   6\' 6"  (1.981 m)    Physical Exam Constitutional:      General: He is not in acute distress.    Appearance: He is not ill-appearing or toxic-appearing.  HENT:     Head: Normocephalic and atraumatic.     Nose: Nose normal. No congestion.     Mouth/Throat:     Mouth: Mucous membranes are moist.     Pharynx: Oropharyngeal exudate present.  Eyes:     Conjunctiva/sclera: Conjunctivae normal.     Pupils: Pupils are equal, round, and reactive to light.  Cardiovascular:     Rate and Rhythm: Normal rate and  regular rhythm.     Heart sounds: No murmur heard.    No gallop.  Pulmonary:     Effort: Pulmonary effort is normal. No respiratory distress.     Breath sounds: Normal breath sounds. No wheezing or rales.  Abdominal:     General: Abdomen is flat. Bowel sounds are normal. There is no distension.     Palpations:  Abdomen is soft.     Tenderness: There is no abdominal tenderness. There is no guarding.  Musculoskeletal:        General: Normal range of motion.     Cervical back: Normal range of motion and neck supple. No rigidity or tenderness.     Comments: Left status post below-knee amputation, right leg has 2+ edema, fifth toe necrosis.  Lymphadenopathy:     Cervical: No cervical adenopathy.  Skin:    General: Skin is warm and dry.     Coloration: Skin is not jaundiced.  Neurological:     General: No focal deficit present.     Mental Status: He is alert and oriented to person, place, and time.     Cranial Nerves: No cranial nerve deficit.  Psychiatric:        Mood and Affect: Mood normal.        Behavior: Behavior normal.     Data Reviewed:  Foot x-ray did not show any evidence of osteomyelitis.  CBC and BMP within normal limits.  Assessment and Plan: Right foot fifth toe dry gangrene. Peripheral arterial disease. Patient has right fifth toe necrosis secondary to peripheral vascular disease.  Will be seen by vascular surgery and podiatry.  Anticipating amputation of fifth toe, patient be also seen by vascular surgery. Continue aspirin, Plavix.  Upper respiratory infection. Patient had cough, nasal congestion for a few days. Check Flu and covid. Symptomatic treatment.   History of DVT. Anticipating surgery, hold off anticoagulation for now.  But will perform duplex ultrasound to rule out acute DVT of the right leg.  Uncontrolled type 2 diabetes with hyperglycemia on long-term insulin use. Restart insulin glargine at half dose, continue scheduled NovoLog and sliding scale  insulin.  Essential hypertension.   Continue some home medicines.  Overweight BMI 28.89. Diet and excise.    Advance Care Planning:   Code Status: Full Code patient wish to be full code.  Consults: Vascular surgery and podiatry.  Family Communication: None  Severity of Illness: The appropriate patient status for this patient is INPATIENT. Inpatient status is judged to be reasonable and necessary in order to provide the required intensity of service to ensure the patient's safety. [name] presenting symptoms, physical exam findings, and initial radiographic and laboratory data in the context of their chronic comorbidities is felt to place them at high risk for further clinical deterioration. Furthermore, it is not anticipated that the patient will be medically stable for discharge from the hospital within 2 midnights of admission.   * I certify that at the point of admission it is my clinical judgment that the patient will require inpatient hospital care spanning beyond 2 midnights from the point of admission due to high intensity of service, high risk for further deterioration and high frequency of surveillance required.*  Author: Marrion Coy, MD 11/03/2023 5:23 PM  For on call review [email or web address].

## 2023-11-03 NOTE — ED Provider Triage Note (Signed)
Emergency Medicine Provider Triage Evaluation Note  HOLLIS TULLER , a 68 y.o. male  was evaluated in triage.  Pt complains of right foot pain. Sent by his MD for evaluation.  Right 4th toe discoloration and pain.  Hx of DM  Review of Systems  Positive: DM, prior amputation toe right foot Negative: No fever   Physical Exam  BP (!) 150/69 (BP Location: Left Arm)   Pulse 77   Temp 98.8 F (37.1 C) (Oral)   Resp 18   Ht 6\' 6"  (1.981 m)   Wt 113.4 kg   SpO2 97%   BMI 28.89 kg/m  Gen:   Awake, no distress   Resp:  Normal effort  MSK:   Moves extremities without difficulty    Right 4th digit discolored, swollen.   Other:    Medical Decision Making  Medically screening exam initiated at 12:14 PM.  Appropriate orders placed.  Terrez D Kole was informed that the remainder of the evaluation will be completed by another provider, this initial triage assessment does not replace that evaluation, and the importance of remaining in the ED until their evaluation is complete.     Tommi Rumps, PA-C 11/03/23 1216

## 2023-11-03 NOTE — ED Provider Notes (Signed)
   [facility] Provider Note    Event Date/Time   First MD Initiated Contact with Patient 11/03/23 1634     (approximate)   History   Toe Injury   HPI  AZAREL BANNER is a 68 y.o. male with history of peripheral arterial disease, diabetes, left lower leg amputation who presents with right fourth toe discoloration, sent in by PCP.  Overall he feels well, has no fevers     Physical Exam   Triage Vital Signs: ED Triage Vitals  Encounter Vitals Group     BP 11/03/23 1204 (!) 150/69     Systolic BP Percentile --      Diastolic BP Percentile --      Pulse Rate 11/03/23 1204 77     Resp 11/03/23 1204 18     Temp 11/03/23 1204 98.8 F (37.1 C)     Temp Source 11/03/23 1204 Oral     SpO2 11/03/23 1204 97 %     Weight 11/03/23 1209 113.4 kg (250 lb)     Height 11/03/23 1209 1.981 m (6\' 6" )     Head Circumference --      Peak Flow --      Pain Score 11/03/23 1209 5     Pain Loc --      Pain Education --      Exclude from Growth Chart --     Most recent vital signs: Vitals:   11/03/23 1208 11/03/23 1550  BP:  (!) 154/85  Pulse:  89  Resp:  16  Temp:  98 F (36.7 C)  SpO2: 97% 100%     General: Awake, no distress.  CV:  Good peripheral perfusion.  Resp:  Normal effort.  Abd:  No distention.  Other:  Right fourth toe, duskiness   ED Results / Procedures / Treatments   Labs (all labs ordered are listed, but only abnormal results are displayed) Labs Reviewed  CBC WITH DIFFERENTIAL/PLATELET - Abnormal; Notable for the following components:      Result Value   Monocytes Absolute 1.1 (*)    All other components within normal limits  LACTIC ACID, PLASMA  COMPREHENSIVE METABOLIC PANEL  LACTIC ACID, PLASMA     EKG     RADIOLOGY Foot x-ray viewed interpret by me, no acute abnormality    PROCEDURES:  Critical Care performed:   Procedures   MEDICATIONS ORDERED IN ED: Medications - No data to display   IMPRESSION / MDM /  ASSESSMENT AND PLAN / ED COURSE  I reviewed the triage vital signs and the nursing notes. Patient's presentation is most consistent with severe exacerbation of chronic illness.  Patient with severe history of PAD, diabetes, multiple amputations presents with discoloration of right fourth toe.  Lab work reviewed and is overall reassuring, patient is afebrile, no signs of osteomyelitis on x-ray  Discussed with Dr. Annamary Rummage of podiatry who recommends admission, vascular evaluation and likely amputation, no antibiotics necessary at this time        FINAL CLINICAL IMPRESSION(S) / ED DIAGNOSES   Final diagnoses:  Gangrene (HCC)     Rx / DC Orders   ED Discharge Orders     None        Note:  This document was prepared using Dragon voice recognition software and may include unintentional dictation errors.   Jene Every, MD 11/03/23 [phone]

## 2023-11-03 NOTE — ED Notes (Signed)
Request made for transport to the floor ?

## 2023-11-04 ENCOUNTER — Inpatient Hospital Stay: Payer: 59

## 2023-11-04 DIAGNOSIS — Z7901 Long term (current) use of anticoagulants: Secondary | ICD-10-CM

## 2023-11-04 DIAGNOSIS — I70261 Atherosclerosis of native arteries of extremities with gangrene, right leg: Secondary | ICD-10-CM

## 2023-11-04 DIAGNOSIS — I96 Gangrene, not elsewhere classified: Secondary | ICD-10-CM | POA: Diagnosis not present

## 2023-11-04 DIAGNOSIS — E1151 Type 2 diabetes mellitus with diabetic peripheral angiopathy without gangrene: Secondary | ICD-10-CM | POA: Diagnosis not present

## 2023-11-04 DIAGNOSIS — Z79899 Other long term (current) drug therapy: Secondary | ICD-10-CM

## 2023-11-04 DIAGNOSIS — Z87891 Personal history of nicotine dependence: Secondary | ICD-10-CM

## 2023-11-04 DIAGNOSIS — Z7982 Long term (current) use of aspirin: Secondary | ICD-10-CM

## 2023-11-04 DIAGNOSIS — Z9889 Other specified postprocedural states: Secondary | ICD-10-CM

## 2023-11-04 DIAGNOSIS — Z95828 Presence of other vascular implants and grafts: Secondary | ICD-10-CM

## 2023-11-04 DIAGNOSIS — Z794 Long term (current) use of insulin: Secondary | ICD-10-CM | POA: Diagnosis not present

## 2023-11-04 LAB — GLUCOSE, CAPILLARY
Glucose-Capillary: 151 mg/dL — ABNORMAL HIGH (ref 70–99)
Glucose-Capillary: 180 mg/dL — ABNORMAL HIGH (ref 70–99)
Glucose-Capillary: 143 mg/dL — ABNORMAL HIGH (ref 70–99)
Glucose-Capillary: 189 mg/dL — ABNORMAL HIGH (ref 70–99)

## 2023-11-04 LAB — HEMOGLOBIN A1C
Hgb A1c MFr Bld: 8.3 % — ABNORMAL HIGH (ref 4.8–5.6)
Mean Plasma Glucose: 191.51 mg/dL

## 2023-11-04 LAB — SARS CORONAVIRUS 2 BY RT PCR: SARS Coronavirus 2 by RT PCR: NEGATIVE

## 2023-11-04 MED ORDER — CEFAZOLIN SODIUM-DEXTROSE 2-4 GM/100ML-% IV SOLN
2.0000 g | INTRAVENOUS | Status: AC
  Administered 2023-11-05: 2 g via INTRAVENOUS
  Filled 2023-11-04: qty 100

## 2023-11-04 MED ORDER — ALBUTEROL SULFATE (2.5 MG/3ML) 0.083% IN NEBU: 3.0000 mL | INHALATION_SOLUTION | RESPIRATORY_TRACT | Status: DC | PRN

## 2023-11-04 MED ORDER — SODIUM CHLORIDE 0.9 % IV SOLN: INTRAVENOUS | Status: DC

## 2023-11-04 NOTE — H&P (View-Only) (Signed)
Hospital Consult    Reason for Consult:  Right Fifth Toe Gangrene  Requesting Physician:  Dr Marrion Coy MD  MRN #:  [id number]  History of Present Illness: This is a 68 y.o. male with medical history significant of type 2 diabetes, coronary disease, history of DVT, peripheral vascular disease, essential hypertension, dyslipidemia, who present to the hospital with right fourth toe gangrene.Patient has right leg swelling, right foot pain for a long while. Condition getting worse now. When he came to the hospital, the fourth toe looks necrotic. ED has spoken with podiatry, need amputation as well as vascular intervention. Patient was last seen on 06/26/23 by Dr Vilinda Flake MD and underwent right lower angiogram.   On exam patient is resting comfortably. Patient does not endorse any pain to his right foot. No complaints overnight. Vital all remain stable. Vascular Surgery consulted to evaluate.    Past Medical History:  Diagnosis Date   Coronary artery disease    Diabetes (HCC)    DVT (deep venous thrombosis) (HCC)    Gangrene of left foot (HCC) 02/09/2018   GERD (gastroesophageal reflux disease)    Hyperlipidemia    Hypertension    Peripheral vascular disease (HCC)     Past Surgical History:  Procedure Laterality Date   ABDOMINAL AORTOGRAM W/LOWER EXTREMITY N/A 02/20/2022   Procedure: ABDOMINAL AORTOGRAM W/LOWER EXTREMITY;  Surgeon: Cephus Shelling, MD;  Location: [facility];  Service: Cardiovascular;  Laterality: N/A;   AMPUTATION Left 10/28/2018   Procedure: AMPUTATION BELOW KNEE;  Surgeon: Annice Needy, MD;  Location: [facility];  Service: Vascular;  Laterality: Left;   AMPUTATION Left 09/10/2021   Procedure: AMPUTATION DIGIT;  Surgeon: Allena Napoleon, MD;  Location: [facility];  Service: Plastics;  Laterality: Left;   AMPUTATION Left 09/14/2021   Procedure: AMPUTATION DIGIT left long finger and irrigation and debridement;  Surgeon: Allena Napoleon, MD;  Location: [facility];   Service: Plastics;  Laterality: Left;   AMPUTATION Right 02/27/2022   Procedure: RIGHT PARTIAL AMPUTATION FOOT;  Surgeon: Felecia Shelling, DPM;  Location: [facility];  Service: Podiatry;  Laterality: Right;   AMPUTATION TOE Left 02/10/2018   Procedure: AMPUTATION TOE;  Surgeon: Linus Galas, DPM;  Location: [facility];  Service: Podiatry;  Laterality: Left;   APPLICATION OF WOUND VAC Left 10/16/2018   Procedure: APPLICATION OF WOUND VAC;  Surgeon: Gwyneth Revels, DPM;  Location: [facility];  Service: Podiatry;  Laterality: Left;   DORSAL SLIT N/A 01/12/2021   Procedure: DORSAL SLIT;  Surgeon: Sondra Come, MD;  Location: [facility];  Service: Urology;  Laterality: N/A;   FEMORAL-TIBIAL BYPASS GRAFT Right 02/25/2022   Procedure: RIGHT LOWER EXTREMITY FEMORAL TO TIBIAL PERONEAL TRUNK BYPASS;  Surgeon: Cephus Shelling, MD;  Location: [facility];  Service: Vascular;  Laterality: Right;  INSERT ARTERIAL LINE   groin surgery     IRRIGATION AND DEBRIDEMENT FOOT Left 09/30/2018   Procedure: IRRIGATION AND DEBRIDEMENT FOOT;  Surgeon: Linus Galas, DPM;  Location: [facility];  Service: Podiatry;  Laterality: Left;   LOWER EXTREMITY ANGIOGRAPHY Left 02/12/2018   Procedure: Lower Extremity Angiography;  Surgeon: Annice Needy, MD;  Location: [facility];  Service: Cardiovascular;  Laterality: Left;   LOWER EXTREMITY ANGIOGRAPHY Left 10/01/2018   Procedure: Lower Extremity Angiography;  Surgeon: Annice Needy, MD;  Location: [facility];  Service: Cardiovascular;  Laterality: Left;   LOWER EXTREMITY ANGIOGRAPHY Left 10/19/2018   Procedure: Lower Extremity Angiography;  Surgeon: Annice Needy, MD;  Location: [facility];  Service: Cardiovascular;  Laterality: Left;   LOWER EXTREMITY ANGIOGRAPHY Left 10/20/2018   Procedure: LOWER EXTREMITY ANGIOGRAPHY;  Surgeon: Annice Needy, MD;  Location: [facility];  Service: Cardiovascular;  Laterality: Left;   LOWER EXTREMITY ANGIOGRAPHY Right 11/25/2018    Procedure: LOWER EXTREMITY ANGIOGRAPHY;  Surgeon: Annice Needy, MD;  Location: [facility];  Service: Cardiovascular;  Laterality: Right;   LOWER EXTREMITY ANGIOGRAPHY Right 01/10/2020   Procedure: LOWER EXTREMITY ANGIOGRAPHY;  Surgeon: Annice Needy, MD;  Location: [facility];  Service: Cardiovascular;  Laterality: Right;   LOWER EXTREMITY ANGIOGRAPHY Right 12/11/2020   Procedure: LOWER EXTREMITY ANGIOGRAPHY;  Surgeon: Annice Needy, MD;  Location: [facility];  Service: Cardiovascular;  Laterality: Right;   LOWER EXTREMITY ANGIOGRAPHY Right 06/26/2023   Procedure: Lower Extremity Angiography;  Surgeon: Renford Dills, MD;  Location: [facility];  Service: Cardiovascular;  Laterality: Right;   LOWER EXTREMITY INTERVENTION  02/12/2018   Procedure: LOWER EXTREMITY INTERVENTION;  Surgeon: Annice Needy, MD;  Location: [facility];  Service: Cardiovascular;;   MINOR IRRIGATION AND DEBRIDEMENT OF WOUND Left 09/10/2021   Procedure: IRRIGATION AND DEBRIDEMENT OF LEFT LONG FINGER WOUND;  Surgeon: Allena Napoleon, MD;  Location: [facility];  Service: Plastics;  Laterality: Left;   NECK SURGERY     PERIPHERAL VASCULAR INTERVENTION  02/20/2022   Procedure: PERIPHERAL VASCULAR INTERVENTION;  Surgeon: Cephus Shelling, MD;  Location: [facility];  Service: Cardiovascular;;  right iliac   TRANSMETATARSAL AMPUTATION Left 10/16/2018   Procedure: TRANSMETATARSAL AMPUTATION LEFT FOOT;  Surgeon: Gwyneth Revels, DPM;  Location: [facility];  Service: Podiatry;  Laterality: Left;   VEIN HARVEST Right 02/25/2022   Procedure: VEIN HARVEST OF RIGHT GREATER SAPHENOUS VEIN;  Surgeon: Cephus Shelling, MD;  Location: [facility];  Service: Vascular;  Laterality: Right;    No Known Allergies  Prior to Admission medications   Medication Sig Start Date End Date Taking? Authorizing Provider  ascorbic acid (VITAMIN C) 500 MG tablet Take 1 tablet (500 mg total) by mouth 2 (two) times  daily. 03/21/22  Yes Love, Evlyn Kanner, PA-C  aspirin EC 81 MG tablet Take 1 tablet (81 mg total) by mouth daily. 01/10/20  Yes Dew, Marlow Baars, MD  atorvastatin (LIPITOR) 80 MG tablet Take 1 tablet (80 mg total) by mouth daily at 6 PM. 03/21/22  Yes Love, Evlyn Kanner, PA-C  Cholecalciferol (VITAMIN D3) 125 MCG (5000 UT) CAPS Take 1 capsule by mouth daily. 04/17/22  Yes [provider]  clopidogrel (PLAVIX) 75 MG tablet Take 1 tablet (75 mg total) by mouth daily at 6 (six) AM. 09/26/23  Yes Georgiana Spinner, NP  ELIQUIS 5 MG TABS tablet Take 5 mg by mouth 2 (two) times daily. 10/21/23  Yes [provider]  gabapentin (NEURONTIN) 100 MG capsule Take 100 mg by mouth 3 (three) times daily.   Yes [provider]  insulin glargine (LANTUS) 100 UNIT/ML Solostar Pen Inject 41 Units into the skin 2 (two) times daily. Patient taking differently: Inject 48 Units into the skin daily. 03/21/22  Yes Love, Evlyn Kanner, PA-C  insulin lispro (HUMALOG) 100 UNIT/ML injection Inject 18 Units into the skin 3 (three) times daily before meals.   Yes [provider]  losartan (COZAAR) 25 MG tablet Take 1 tablet (25 mg total) by mouth daily. 03/21/22  Yes Love,  Evlyn Kanner, PA-C  magnesium oxide (MAG-OX) 400 MG tablet Take 0.5 tablets (200 mg total) by mouth daily. Patient taking differently: Take 400 mg by mouth daily. 03/21/22  Yes Love, Evlyn Kanner, PA-C  NICORETTE STARTER KIT 4 MG gum Take 4 mg by mouth as needed for smoking cessation. 02/05/23  Yes [provider]  TRULICITY 0.75 MG/0.5ML SOPN Inject 1.5 mg into the skin once a week. 03/22/22  Yes [provider]  Zinc Sulfate 220 (50 Zn) MG TABS Take 1 tablet (220 mg total) by mouth daily after supper. 03/21/22  Yes Love, Evlyn Kanner, PA-C  acidophilus (RISAQUAD) CAPS capsule Take 1 capsule by mouth daily after supper. Patient not taking: Reported on 07/15/2023 03/21/22   Love, Evlyn Kanner, PA-C  apixaban (ELIQUIS) 2.5 MG TABS tablet Take 1 tablet (2.5  mg total) by mouth 2 (two) times daily. Patient not taking: Reported on 11/03/2023 07/01/23   Lolita Patella B, MD  diclofenac Sodium (VOLTAREN) 1 % GEL Apply 4 g topically 4 (four) times daily. Patient not taking: Reported on 11/03/2023 07/01/23   Tresa Moore, MD  methocarbamol (ROBAXIN) 500 MG tablet Take 1 tablet (500 mg total) by mouth every 6 (six) hours as needed for muscle spasms. Patient not taking: Reported on 11/03/2023 03/21/22   Love, Evlyn Kanner, PA-C  oxyCODONE-acetaminophen (PERCOCET/ROXICET) 5-325 MG tablet Take 1 tablet by mouth every 4 (four) hours as needed for severe pain (pain score 7-10). Patient not taking: Reported on 11/03/2023 08/07/23   Irean Hong, MD  polyethylene glycol powder [facility]) 17 GM/SCOOP powder Take 34 g by mouth daily. Patient not taking: Reported on 11/03/2023 01/16/23   Katha Cabal, DO  senna-docusate (SENOKOT-S) 8.6-50 MG tablet Take 1 tablet by mouth 2 (two) times daily. Patient not taking: Reported on 11/03/2023 07/01/23   Tresa Moore, MD    Social History   Socioeconomic History   Marital status: Divorced    Spouse name: Not on file   Number of children: Not on file   Years of education: Not on file   Highest education level: Not on file  Occupational History   Not on file  Tobacco Use   Smoking status: Former    Current packs/day: 0.00    Average packs/day: 1 pack/day for 47.0 years (47.0 ttl pk-yrs)    Types: Cigarettes    Start date: 01/1974    Quit date: 01/2021    Years since quitting: 2.8   Smokeless tobacco: Never  Vaping Use   Vaping status: Never Used  Substance and Sexual Activity   Alcohol use: No   Drug use: No   Sexual activity: Not Currently  Other Topics Concern   Not on file  Social History Narrative   Not on file   Social Drivers of Health   Financial Resource Strain: Not on file  Food Insecurity: No Food Insecurity (11/04/2023)   Hunger Vital Sign    Worried About Running Out of Food  in the Last Year: Never true    Ran Out of Food in the Last Year: Never true  Transportation Needs: No Transportation Needs (11/04/2023)   PRAPARE - Administrator, Civil Service (Medical): No    Lack of Transportation (Non-Medical): No  Physical Activity: Not on file  Stress: Not on file  Social Connections: Moderately Isolated (11/04/2023)   Social Connection and Isolation Panel [NHANES]    Frequency of Communication with Friends and Family: More than three times a week  Frequency of Social Gatherings with Friends and Family: More than three times a week    Attends Religious Services: 1 to 4 times per year    Active Member of [employer] or Organizations: No    Attends Banker Meetings: Never    Marital Status: Divorced  Catering manager Violence: Not At Risk (11/04/2023)   Humiliation, Afraid, Rape, and Kick questionnaire    Fear of Current or Ex-Partner: No    Emotionally Abused: No    Physically Abused: No    Sexually Abused: No     Family History  Problem Relation Age of Onset   Diabetes Mother    Coronary artery disease Father    Hypertension Sister    Leukemia Brother     ROS: Otherwise negative unless mentioned in HPI  Physical Examination  Vitals:   11/03/23 2341 11/04/23 0435  BP: (!) 161/79 138/70  Pulse: 98 83  Resp: 20 20  Temp: 98.9 F (37.2 C) 98.6 F (37 C)  SpO2: 94% 96%   Body mass index is 28.89 kg/m.  General:  WDWN in NAD Gait: Not observed HENT: WNL, normocephalic Pulmonary: normal non-labored breathing, without Rales, rhonchi,  wheezing Cardiac: regular, without  Murmurs, rubs or gallops; without carotid bruits Abdomen: Positive bowel sounds throughout, soft, NT/ND, no masses Skin: without rashes Vascular Exam/Pulses: Left BKA Right 5th toe gamgrene. Both lower extremities warm to the touch.  Extremities: with ischemic changes, with Gangrene , with cellulitis; without open wounds;  Musculoskeletal: no muscle wasting  or atrophy  Neurologic: A&O X 3;  No focal weakness or paresthesias are detected; speech is fluent/normal Psychiatric:  The pt has Normal affect. Lymph:  Unremarkable  CBC    Component Value Date/Time   WBC 7.4 11/03/2023 1213   RBC 5.21 11/03/2023 1213   HGB 14.6 11/03/2023 1213   HGB 14.1 11/17/2013 0621   HCT 44.3 11/03/2023 1213   HCT 41.0 02/16/2022 0608   PLT 202 11/03/2023 1213   PLT 218 11/17/2013 0621   MCV 85.0 11/03/2023 1213   MCV 86 11/17/2013 0621   MCH 28.0 11/03/2023 1213   MCHC 33.0 11/03/2023 1213   RDW 14.8 11/03/2023 1213   RDW 13.6 11/17/2013 0621   LYMPHSABS 2.1 11/03/2023 1213   LYMPHSABS 2.4 11/17/2013 0621   MONOABS 1.1 (H) 11/03/2023 1213   MONOABS 0.9 11/17/2013 0621   EOSABS 0.0 11/03/2023 1213   EOSABS 0.1 11/17/2013 0621   BASOSABS 0.0 11/03/2023 1213   BASOSABS 0.0 11/17/2013 0621    BMET    Component Value Date/Time   NA 137 11/03/2023 1213   NA 135 (L) 11/17/2013 0621   K 3.5 11/03/2023 1213   K 4.3 11/17/2013 0621   CL 100 11/03/2023 1213   CL 99 11/17/2013 0621   CO2 25 11/03/2023 1213   CO2 31 11/17/2013 0621   GLUCOSE 77 11/03/2023 1213   GLUCOSE 172 (H) 11/17/2013 0621   BUN 11 11/03/2023 1213   BUN 10 11/17/2013 0621   CREATININE 0.89 11/03/2023 1213   CREATININE 1.05 04/18/2022 1034   CALCIUM 9.0 11/03/2023 1213   CALCIUM 9.6 11/17/2013 0621   GFRNONAA >60 11/03/2023 1213   GFRNONAA >60 11/17/2013 0621   GFRAA >60 01/10/2020 0808   GFRAA >60 11/17/2013 0621    COAGS: Lab Results  Component Value Date   INR 1.2 06/25/2023   INR 1.3 (H) 02/15/2022   INR 1.2 03/31/2021     Non-Invasive Vascular Imaging:   EXAM:11/03/23 RIGHT  LOWER EXTREMITY VENOUS DOPPLER ULTRASOUND   TECHNIQUE: Gray-scale sonography with compression, as well as color and duplex ultrasound, were performed to evaluate the deep venous system(s) from the level of the common femoral vein through the popliteal and proximal calf veins.    COMPARISON:  None Available.   FINDINGS: VENOUS   Normal compressibility of the common femoral, superficial femoral, and popliteal veins. The posterior tibial vein is within normal limits. The peroneal vein is not visualized. Visualized portions of profunda femoral vein and great saphenous vein unremarkable. No filling defects to suggest DVT on grayscale or color Doppler imaging. Doppler waveforms show normal direction of venous flow, normal respiratory plasticity and response to augmentation.   Limited views of the contralateral common femoral vein are unremarkable.   OTHER   None.   Limitations: none   IMPRESSION: No evidence of right lower extremity DVT.  EXAM:08/07/2023 CT ANGIOGRAPHY OF ABDOMINAL AORTA WITH ILIOFEMORAL RUNOFF   TECHNIQUE: Multidetector CT imaging of the abdomen, pelvis and lower extremities was performed using the standard protocol during bolus administration of intravenous contrast. Multiplanar CT image reconstructions and MIPs were obtained to evaluate the vascular anatomy.   RADIATION DOSE REDUCTION: This exam was performed according to the departmental dose-optimization program which includes automated exposure control, adjustment of the mA and/or kV according to patient size and/or use of iterative reconstruction technique.   CONTRAST:  OMNIPAQUE IOHEXOL 350 MG/ML SOLN   COMPARISON:  Lower extremity ultrasound 08/06/2023   FINDINGS: VASCULAR   Aorta: No aneurysm or dissection. Mixed density atherosclerotic plaque causes up to mild narrowing.   Celiac: Patent without aneurysm or dissection.   SMA: Patent without aneurysm or dissection.   Renals: Patent without aneurysm or dissection.   IMA: Patent.   RIGHT Lower Extremity   Inflow: Right common iliac artery is patent with scattered calcified and noncalcified atherosclerotic plaque. External iliac stent is patent.   Outflow: Atherosclerotic plaque causes mild narrowing  of the common and profunda femoral arteries. The native superficial femoral artery including the stent and native popliteal artery are occluded. Common femoral artery to tibioperoneal trunk bypass graft is patent.   Runoff: Patent three-vessel runoff. The anterior tibial artery arises proximal to the insertion of the bypass graft on the tibioperoneal trunk.   LEFT Lower Extremity   Inflow: Atherosclerotic plaque causes mild narrowing of the common iliac artery. The common-external iliac artery stent is patent.   Outflow: Multifocal areas of mild narrowing in the common and profunda femoral artery secondary to atherosclerotic plaque. The superficial femoral-popliteal artery stent is occluded. The mid and lower thigh supplied by collaterals from the profunda femoral artery.   Runoff: Left BKA.   Veins: No obvious venous abnormality within the limitations of this arterial phase study.   Review of the MIP images confirms the above findings.   NON-VASCULAR   Lower chest: No acute abnormality.   Hepatobiliary: No acute abnormality.   Pancreas: Unremarkable.   Spleen: Unremarkable.   Adrenals/Urinary Tract: No acute abnormality.   Stomach/Bowel: No bowel obstruction or bowel wall thickening. Normal appendix and stomach.   Lymphatic: No lymphadenopathy.   Reproductive: Enlarged prostate.   Other: No free intraperitoneal fluid or air. Marked subcutaneous soft tissue edema about the right calf and foot. No abscess or soft tissue gas. No intramuscular edema or fluid collection.   Musculoskeletal: No acute fracture.  Left BKA.   IMPRESSION: 1. Patent right common femoral artery to tibioperoneal trunk bypass graft. Three-vessel runoff of the right lower  extremity. 2. Marked subcutaneous soft tissue edema about the right calf and foot. No abscess or soft tissue gas. 3. Left BKA. Occluded left superficial femoral-popliteal artery stent. The left lower extremity is supplied  by collaterals from the profunda femoral artery. 4. No acute abnormality in the abdomen or pelvis.   Satin: Yes Beta Blocker:  No. Aspirin:  Yes.   ACEI:  No. ARB:  Yes.   CCB use:  No Other antiplatelets/anticoagulants:  Yes.   Eliquis 2.5 mg BID & Plavix 75 mg Daily    ASSESSMENT/PLAN: This is a 68 y.o. male who presents to [facility] emergency department with fifth toe gangrene on his right foot.  Patient was last seen on 06/26/2023 by Dr. [name] MD and underwent a right lower extremity angiogram.  Patient has a history of right femoral tibial bypass grafting that was angioplastied back on 06/26/23.   Vascular surgery plans on taking the patient to the vascular lab on 11/05/2023 for right lower extremity angiogram with possible intervention.  This is to determine what kind of blood flow he may have to his right foot now that he has gangrene to the right 4th toe.  This will help determine if the patient needs amputation of the toe or further transmetatarsal amputation or possible below the knee amputation.  I discussed in detail this morning with the patient the procedure, benefits, risk, and complications.  He endorses having this procedure before.  He would like to proceed to soon as possible.  I answered all his questions this morning.  Patient will be made n.p.o. midnight before the procedure.  -I discussed the plan in detail with Dr. Festus Barren MD and he agrees with the plan.   Marcie Bal [facility] 11/04/2023 7:14 AM

## 2023-11-04 NOTE — Consult Note (Signed)
Hospital Consult    Reason for Consult:  Right Fifth Toe Gangrene  Requesting Physician:  Dr Marrion Coy MD  MRN #:  [id number]  History of Present Illness: This is a 68 y.o. male with medical history significant of type 2 diabetes, coronary disease, history of DVT, peripheral vascular disease, essential hypertension, dyslipidemia, who present to the hospital with right fourth toe gangrene.Patient has right leg swelling, right foot pain for a long while. Condition getting worse now. When he came to the hospital, the fourth toe looks necrotic. ED has spoken with podiatry, need amputation as well as vascular intervention. Patient was last seen on 06/26/23 by Dr Vilinda Flake MD and underwent right lower angiogram.   On exam patient is resting comfortably. Patient does not endorse any pain to his right foot. No complaints overnight. Vital all remain stable. Vascular Surgery consulted to evaluate.    Past Medical History:  Diagnosis Date   Coronary artery disease    Diabetes (HCC)    DVT (deep venous thrombosis) (HCC)    Gangrene of left foot (HCC) 02/09/2018   GERD (gastroesophageal reflux disease)    Hyperlipidemia    Hypertension    Peripheral vascular disease (HCC)     Past Surgical History:  Procedure Laterality Date   ABDOMINAL AORTOGRAM W/LOWER EXTREMITY N/A 02/20/2022   Procedure: ABDOMINAL AORTOGRAM W/LOWER EXTREMITY;  Surgeon: Cephus Shelling, MD;  [facility];  Service: Cardiovascular;  Laterality: N/A;   AMPUTATION Left 10/28/2018   Procedure: AMPUTATION BELOW KNEE;  Surgeon: Annice Needy, MD;  Location: [facility];  Service: Vascular;  Laterality: Left;   AMPUTATION Left 09/10/2021   Procedure: AMPUTATION DIGIT;  Surgeon: Allena Napoleon, MD;  Location: [facility];  Service: Plastics;  Laterality: Left;   AMPUTATION Left 09/14/2021   Procedure: AMPUTATION DIGIT left long finger and irrigation and debridement;  Surgeon: Allena Napoleon, MD;  Location: [facility];   Service: Plastics;  Laterality: Left;   AMPUTATION Right 02/27/2022   Procedure: RIGHT PARTIAL AMPUTATION FOOT;  Surgeon: Felecia Shelling, DPM;  Location: [facility];  Service: Podiatry;  Laterality: Right;   AMPUTATION TOE Left 02/10/2018   Procedure: AMPUTATION TOE;  Surgeon: Linus Galas, DPM;  Location: [facility];  Service: Podiatry;  Laterality: Left;   APPLICATION OF WOUND VAC Left 10/16/2018   Procedure: APPLICATION OF WOUND VAC;  Surgeon: Gwyneth Revels, DPM;  Location: [facility];  Service: Podiatry;  Laterality: Left;   DORSAL SLIT N/A 01/12/2021   Procedure: DORSAL SLIT;  Surgeon: Sondra Come, MD;  Location: [facility];  Service: Urology;  Laterality: N/A;   FEMORAL-TIBIAL BYPASS GRAFT Right 02/25/2022   Procedure: RIGHT LOWER EXTREMITY FEMORAL TO TIBIAL PERONEAL TRUNK BYPASS;  Surgeon: Cephus Shelling, MD;  Location: [facility];  Service: Vascular;  Laterality: Right;  INSERT ARTERIAL LINE   groin surgery     IRRIGATION AND DEBRIDEMENT FOOT Left 09/30/2018   Procedure: IRRIGATION AND DEBRIDEMENT FOOT;  Surgeon: Linus Galas, DPM;  Location: [facility];  Service: Podiatry;  Laterality: Left;   LOWER EXTREMITY ANGIOGRAPHY Left 02/12/2018   Procedure: Lower Extremity Angiography;  Surgeon: Annice Needy, MD;  Location: [facility];  Service: Cardiovascular;  Laterality: Left;   LOWER EXTREMITY ANGIOGRAPHY Left 10/01/2018   Procedure: Lower Extremity Angiography;  Surgeon: Annice Needy, MD;  Location: [facility];  Service: Cardiovascular;  Laterality: Left;   LOWER EXTREMITY ANGIOGRAPHY Left 10/19/2018   Procedure: Lower Extremity Angiography;  Surgeon: Annice Needy, MD;  Location: [facility];  Service: Cardiovascular;  Laterality: Left;   LOWER EXTREMITY ANGIOGRAPHY Left 10/20/2018   Procedure: LOWER EXTREMITY ANGIOGRAPHY;  Surgeon: Annice Needy, MD;  Location: [facility];  Service: Cardiovascular;  Laterality: Left;   LOWER EXTREMITY ANGIOGRAPHY Right 11/25/2018    Procedure: LOWER EXTREMITY ANGIOGRAPHY;  Surgeon: Annice Needy, MD;  Location: [facility];  Service: Cardiovascular;  Laterality: Right;   LOWER EXTREMITY ANGIOGRAPHY Right 01/10/2020   Procedure: LOWER EXTREMITY ANGIOGRAPHY;  Surgeon: Annice Needy, MD;  Location: [facility];  Service: Cardiovascular;  Laterality: Right;   LOWER EXTREMITY ANGIOGRAPHY Right 12/11/2020   Procedure: LOWER EXTREMITY ANGIOGRAPHY;  Surgeon: Annice Needy, MD;  Location: [facility];  Service: Cardiovascular;  Laterality: Right;   LOWER EXTREMITY ANGIOGRAPHY Right 06/26/2023   Procedure: Lower Extremity Angiography;  Surgeon: Renford Dills, MD;  Location: [facility];  Service: Cardiovascular;  Laterality: Right;   LOWER EXTREMITY INTERVENTION  02/12/2018   Procedure: LOWER EXTREMITY INTERVENTION;  Surgeon: Annice Needy, MD;  Location: [facility];  Service: Cardiovascular;;   MINOR IRRIGATION AND DEBRIDEMENT OF WOUND Left 09/10/2021   Procedure: IRRIGATION AND DEBRIDEMENT OF LEFT LONG FINGER WOUND;  Surgeon: Allena Napoleon, MD;  Location: [facility];  Service: Plastics;  Laterality: Left;   NECK SURGERY     PERIPHERAL VASCULAR INTERVENTION  02/20/2022   Procedure: PERIPHERAL VASCULAR INTERVENTION;  Surgeon: Cephus Shelling, MD;  [facility];  Service: Cardiovascular;;  right iliac   TRANSMETATARSAL AMPUTATION Left 10/16/2018   Procedure: TRANSMETATARSAL AMPUTATION LEFT FOOT;  Surgeon: Gwyneth Revels, DPM;  Location: [facility];  Service: Podiatry;  Laterality: Left;   VEIN HARVEST Right 02/25/2022   Procedure: VEIN HARVEST OF RIGHT GREATER SAPHENOUS VEIN;  Surgeon: Cephus Shelling, MD;  Location: [facility];  Service: Vascular;  Laterality: Right;    No Known Allergies  Prior to Admission medications   Medication Sig Start Date End Date Taking? Authorizing Provider  ascorbic acid (VITAMIN C) 500 MG tablet Take 1 tablet (500 mg total) by mouth 2 (two) times  daily. 03/21/22  Yes Love, Evlyn Kanner, PA-C  aspirin EC 81 MG tablet Take 1 tablet (81 mg total) by mouth daily. 01/10/20  Yes Dew, Marlow Baars, MD  atorvastatin (LIPITOR) 80 MG tablet Take 1 tablet (80 mg total) by mouth daily at 6 PM. 03/21/22  Yes Love, Evlyn Kanner, PA-C  Cholecalciferol (VITAMIN D3) 125 MCG (5000 UT) CAPS Take 1 capsule by mouth daily. 04/17/22  Yes [provider]  clopidogrel (PLAVIX) 75 MG tablet Take 1 tablet (75 mg total) by mouth daily at 6 (six) AM. 09/26/23  Yes Georgiana Spinner, NP  ELIQUIS 5 MG TABS tablet Take 5 mg by mouth 2 (two) times daily. 10/21/23  Yes [provider]  gabapentin (NEURONTIN) 100 MG capsule Take 100 mg by mouth 3 (three) times daily.   Yes [provider]  insulin glargine (LANTUS) 100 UNIT/ML Solostar Pen Inject 41 Units into the skin 2 (two) times daily. Patient taking differently: Inject 48 Units into the skin daily. 03/21/22  Yes Love, Evlyn Kanner, PA-C  insulin lispro (HUMALOG) 100 UNIT/ML injection Inject 18 Units into the skin 3 (three) times daily before meals.   Yes [provider]  losartan (COZAAR) 25 MG tablet Take 1 tablet (25 mg total) by mouth daily. 03/21/22  Yes Love,  Evlyn Kanner, PA-C  magnesium oxide (MAG-OX) 400 MG tablet Take 0.5 tablets (200 mg total) by mouth daily. Patient taking differently: Take 400 mg by mouth daily. 03/21/22  Yes Love, Evlyn Kanner, PA-C  NICORETTE STARTER KIT 4 MG gum Take 4 mg by mouth as needed for smoking cessation. 02/05/23  Yes [provider]  TRULICITY 0.75 MG/0.5ML SOPN Inject 1.5 mg into the skin once a week. 03/22/22  Yes [provider]  Zinc Sulfate 220 (50 Zn) MG TABS Take 1 tablet (220 mg total) by mouth daily after supper. 03/21/22  Yes Love, Evlyn Kanner, PA-C  acidophilus (RISAQUAD) CAPS capsule Take 1 capsule by mouth daily after supper. Patient not taking: Reported on 07/15/2023 03/21/22   Love, Evlyn Kanner, PA-C  apixaban (ELIQUIS) 2.5 MG TABS tablet Take 1 tablet (2.5  mg total) by mouth 2 (two) times daily. Patient not taking: Reported on 11/03/2023 07/01/23   Lolita Patella B, MD  diclofenac Sodium (VOLTAREN) 1 % GEL Apply 4 g topically 4 (four) times daily. Patient not taking: Reported on 11/03/2023 07/01/23   Tresa Moore, MD  methocarbamol (ROBAXIN) 500 MG tablet Take 1 tablet (500 mg total) by mouth every 6 (six) hours as needed for muscle spasms. Patient not taking: Reported on 11/03/2023 03/21/22   Love, Evlyn Kanner, PA-C  oxyCODONE-acetaminophen (PERCOCET/ROXICET) 5-325 MG tablet Take 1 tablet by mouth every 4 (four) hours as needed for severe pain (pain score 7-10). Patient not taking: Reported on 11/03/2023 08/07/23   Irean Hong, MD  polyethylene glycol powder [facility]) 17 GM/SCOOP powder Take 34 g by mouth daily. Patient not taking: Reported on 11/03/2023 01/16/23   Katha Cabal, DO  senna-docusate (SENOKOT-S) 8.6-50 MG tablet Take 1 tablet by mouth 2 (two) times daily. Patient not taking: Reported on 11/03/2023 07/01/23   Tresa Moore, MD    Social History   Socioeconomic History   Marital status: Divorced    Spouse name: Not on file   Number of children: Not on file   Years of education: Not on file   Highest education level: Not on file  Occupational History   Not on file  Tobacco Use   Smoking status: Former    Current packs/day: 0.00    Average packs/day: 1 pack/day for 47.0 years (47.0 ttl pk-yrs)    Types: Cigarettes    Start date: 01/1974    Quit date: 01/2021    Years since quitting: 2.8   Smokeless tobacco: Never  Vaping Use   Vaping status: Never Used  Substance and Sexual Activity   Alcohol use: No   Drug use: No   Sexual activity: Not Currently  Other Topics Concern   Not on file  Social History Narrative   Not on file   Social Drivers of Health   Financial Resource Strain: Not on file  Food Insecurity: No Food Insecurity (11/04/2023)   Hunger Vital Sign    Worried About Running Out of Food  in the Last Year: Never true    Ran Out of Food in the Last Year: Never true  Transportation Needs: No Transportation Needs (11/04/2023)   PRAPARE - Administrator, Civil Service (Medical): No    Lack of Transportation (Non-Medical): No  Physical Activity: Not on file  Stress: Not on file  Social Connections: Moderately Isolated (11/04/2023)   Social Connection and Isolation Panel [NHANES]    Frequency of Communication with Friends and Family: More than three times a week  Frequency of Social Gatherings with Friends and Family: More than three times a week    Attends Religious Services: 1 to 4 times per year    Active Member of [employer] or Organizations: No    Attends Banker Meetings: Never    Marital Status: Divorced  Catering manager Violence: Not At Risk (11/04/2023)   Humiliation, Afraid, Rape, and Kick questionnaire    Fear of Current or Ex-Partner: No    Emotionally Abused: No    Physically Abused: No    Sexually Abused: No     Family History  Problem Relation Age of Onset   Diabetes Mother    Coronary artery disease Father    Hypertension Sister    Leukemia Brother     ROS: Otherwise negative unless mentioned in HPI  Physical Examination  Vitals:   11/03/23 2341 11/04/23 0435  BP: (!) 161/79 138/70  Pulse: 98 83  Resp: 20 20  Temp: 98.9 F (37.2 C) 98.6 F (37 C)  SpO2: 94% 96%   Body mass index is 28.89 kg/m.  General:  WDWN in NAD Gait: Not observed HENT: WNL, normocephalic Pulmonary: normal non-labored breathing, without Rales, rhonchi,  wheezing Cardiac: regular, without  Murmurs, rubs or gallops; without carotid bruits Abdomen: Positive bowel sounds throughout, soft, NT/ND, no masses Skin: without rashes Vascular Exam/Pulses: Left BKA Right 5th toe gamgrene. Both lower extremities warm to the touch.  Extremities: with ischemic changes, with Gangrene , with cellulitis; without open wounds;  Musculoskeletal: no muscle wasting  or atrophy  Neurologic: A&O X 3;  No focal weakness or paresthesias are detected; speech is fluent/normal Psychiatric:  The pt has Normal affect. Lymph:  Unremarkable  CBC    Component Value Date/Time   WBC 7.4 11/03/2023 1213   RBC 5.21 11/03/2023 1213   HGB 14.6 11/03/2023 1213   HGB 14.1 11/17/2013 0621   HCT 44.3 11/03/2023 1213   HCT 41.0 02/16/2022 0608   PLT 202 11/03/2023 1213   PLT 218 11/17/2013 0621   MCV 85.0 11/03/2023 1213   MCV 86 11/17/2013 0621   MCH 28.0 11/03/2023 1213   MCHC 33.0 11/03/2023 1213   RDW 14.8 11/03/2023 1213   RDW 13.6 11/17/2013 0621   LYMPHSABS 2.1 11/03/2023 1213   LYMPHSABS 2.4 11/17/2013 0621   MONOABS 1.1 (H) 11/03/2023 1213   MONOABS 0.9 11/17/2013 0621   EOSABS 0.0 11/03/2023 1213   EOSABS 0.1 11/17/2013 0621   BASOSABS 0.0 11/03/2023 1213   BASOSABS 0.0 11/17/2013 0621    BMET    Component Value Date/Time   NA 137 11/03/2023 1213   NA 135 (L) 11/17/2013 0621   K 3.5 11/03/2023 1213   K 4.3 11/17/2013 0621   CL 100 11/03/2023 1213   CL 99 11/17/2013 0621   CO2 25 11/03/2023 1213   CO2 31 11/17/2013 0621   GLUCOSE 77 11/03/2023 1213   GLUCOSE 172 (H) 11/17/2013 0621   BUN 11 11/03/2023 1213   BUN 10 11/17/2013 0621   CREATININE 0.89 11/03/2023 1213   CREATININE 1.05 04/18/2022 1034   CALCIUM 9.0 11/03/2023 1213   CALCIUM 9.6 11/17/2013 0621   GFRNONAA >60 11/03/2023 1213   GFRNONAA >60 11/17/2013 0621   GFRAA >60 01/10/2020 0808   GFRAA >60 11/17/2013 0621    COAGS: Lab Results  Component Value Date   INR 1.2 06/25/2023   INR 1.3 (H) 02/15/2022   INR 1.2 03/31/2021     Non-Invasive Vascular Imaging:   EXAM:11/03/23 RIGHT  LOWER EXTREMITY VENOUS DOPPLER ULTRASOUND   TECHNIQUE: Gray-scale sonography with compression, as well as color and duplex ultrasound, were performed to evaluate the deep venous system(s) from the level of the common femoral vein through the popliteal and proximal calf veins.    COMPARISON:  None Available.   FINDINGS: VENOUS   Normal compressibility of the common femoral, superficial femoral, and popliteal veins. The posterior tibial vein is within normal limits. The peroneal vein is not visualized. Visualized portions of profunda femoral vein and great saphenous vein unremarkable. No filling defects to suggest DVT on grayscale or color Doppler imaging. Doppler waveforms show normal direction of venous flow, normal respiratory plasticity and response to augmentation.   Limited views of the contralateral common femoral vein are unremarkable.   OTHER   None.   Limitations: none   IMPRESSION: No evidence of right lower extremity DVT.  EXAM:08/07/2023 CT ANGIOGRAPHY OF ABDOMINAL AORTA WITH ILIOFEMORAL RUNOFF   TECHNIQUE: Multidetector CT imaging of the abdomen, pelvis and lower extremities was performed using the standard protocol during bolus administration of intravenous contrast. Multiplanar CT image reconstructions and MIPs were obtained to evaluate the vascular anatomy.   RADIATION DOSE REDUCTION: This exam was performed according to the departmental dose-optimization program which includes automated exposure control, adjustment of the mA and/or kV according to patient size and/or use of iterative reconstruction technique.   CONTRAST:  OMNIPAQUE IOHEXOL 350 MG/ML SOLN   COMPARISON:  Lower extremity ultrasound 08/06/2023   FINDINGS: VASCULAR   Aorta: No aneurysm or dissection. Mixed density atherosclerotic plaque causes up to mild narrowing.   Celiac: Patent without aneurysm or dissection.   SMA: Patent without aneurysm or dissection.   Renals: Patent without aneurysm or dissection.   IMA: Patent.   RIGHT Lower Extremity   Inflow: Right common iliac artery is patent with scattered calcified and noncalcified atherosclerotic plaque. External iliac stent is patent.   Outflow: Atherosclerotic plaque causes mild narrowing  of the common and profunda femoral arteries. The native superficial femoral artery including the stent and native popliteal artery are occluded. Common femoral artery to tibioperoneal trunk bypass graft is patent.   Runoff: Patent three-vessel runoff. The anterior tibial artery arises proximal to the insertion of the bypass graft on the tibioperoneal trunk.   LEFT Lower Extremity   Inflow: Atherosclerotic plaque causes mild narrowing of the common iliac artery. The common-external iliac artery stent is patent.   Outflow: Multifocal areas of mild narrowing in the common and profunda femoral artery secondary to atherosclerotic plaque. The superficial femoral-popliteal artery stent is occluded. The mid and lower thigh supplied by collaterals from the profunda femoral artery.   Runoff: Left BKA.   Veins: No obvious venous abnormality within the limitations of this arterial phase study.   Review of the MIP images confirms the above findings.   NON-VASCULAR   Lower chest: No acute abnormality.   Hepatobiliary: No acute abnormality.   Pancreas: Unremarkable.   Spleen: Unremarkable.   Adrenals/Urinary Tract: No acute abnormality.   Stomach/Bowel: No bowel obstruction or bowel wall thickening. Normal appendix and stomach.   Lymphatic: No lymphadenopathy.   Reproductive: Enlarged prostate.   Other: No free intraperitoneal fluid or air. Marked subcutaneous soft tissue edema about the right calf and foot. No abscess or soft tissue gas. No intramuscular edema or fluid collection.   Musculoskeletal: No acute fracture.  Left BKA.   IMPRESSION: 1. Patent right common femoral artery to tibioperoneal trunk bypass graft. Three-vessel runoff of the right lower  extremity. 2. Marked subcutaneous soft tissue edema about the right calf and foot. No abscess or soft tissue gas. 3. Left BKA. Occluded left superficial femoral-popliteal artery stent. The left lower extremity is supplied  by collaterals from the profunda femoral artery. 4. No acute abnormality in the abdomen or pelvis.   Satin: Yes Beta Blocker:  No. Aspirin:  Yes.   ACEI:  No. ARB:  Yes.   CCB use:  No Other antiplatelets/anticoagulants:  Yes.   Eliquis 2.5 mg BID & Plavix 75 mg Daily    ASSESSMENT/PLAN: This is a 68 y.o. male who presents to [facility] emergency department with fifth toe gangrene on his right foot.  Patient was last seen on 06/26/2023 by Dr. [name] MD and underwent a right lower extremity angiogram.  Patient has a history of right femoral tibial bypass grafting that was angioplastied back on 06/26/23.   Vascular surgery plans on taking the patient to the vascular lab on 11/05/2023 for right lower extremity angiogram with possible intervention.  This is to determine what kind of blood flow he may have to his right foot now that he has gangrene to the right 4th toe.  This will help determine if the patient needs amputation of the toe or further transmetatarsal amputation or possible below the knee amputation.  I discussed in detail this morning with the patient the procedure, benefits, risk, and complications.  He endorses having this procedure before.  He would like to proceed to soon as possible.  I answered all his questions this morning.  Patient will be made n.p.o. midnight before the procedure.  -I discussed the plan in detail with Dr. Festus Barren MD and he agrees with the plan.   Marcie Bal [facility] 11/04/2023 7:14 AM

## 2023-11-04 NOTE — Consult Note (Signed)
PODIATRY CONSULTATION  NAME Austin [name] MRN [id number] DOB 01-Jun-1956 DOA 11/03/2023   Reason for consult:  Chief Complaint  Patient presents with   Toe Injury    Attending/Consulting physician: Haze Rushing MD  History of present illness: 68 y.o. male with history of DM2 with peripheral arterial disease coronary artery disease history of DVT HTN dyslipidemia.  Presented due to concern for fourth toe gangrene on the right foot.  Has noticed increased right leg swelling and right foot pain says has been going on for quite a while getting worse within the last 2 weeks.  He does have a vascular history has had multiple procedures done.  Has a left lower extremity BKA and has had prior right foot partial fifth ray amputation.  Past Medical History:  Diagnosis Date   Coronary artery disease    Diabetes (HCC)    DVT (deep venous thrombosis) (HCC)    Gangrene of left foot (HCC) 02/09/2018   GERD (gastroesophageal reflux disease)    Hyperlipidemia    Hypertension    Peripheral vascular disease (HCC)        Latest Ref Rng & Units 11/03/2023   12:13 PM 08/06/2023    5:30 PM 06/30/2023    4:29 AM  CBC  WBC 4.0 - 10.5 K/uL 7.4  9.3  9.4   Hemoglobin 13.0 - 17.0 g/dL 52.8  41.3  24.4   Hematocrit 39.0 - 52.0 % 44.3  42.0  44.9   Platelets 150 - 400 K/uL 202  219  188        Latest Ref Rng & Units 11/03/2023   12:13 PM 08/06/2023    5:30 PM 06/30/2023   10:57 AM  BMP  Glucose 70 - 99 mg/dL 77  010  272   BUN 8 - 23 mg/dL 11  15  18    Creatinine 0.61 - 1.24 mg/dL 5.36  6.44  0.34   Sodium 135 - 145 mmol/L 137  141  138   Potassium 3.5 - 5.1 mmol/L 3.5  4.1  4.5   Chloride 98 - 111 mmol/L 100  110  104   CO2 22 - 32 mmol/L 25  24  25    Calcium 8.9 - 10.3 mg/dL 9.0  9.1  9.5       Physical Exam: Lower Extremity Exam Vasc: R - PT none palpable, DP none palpable.   L -BKA  Derm: R -dry gangrenous changes noted to the right fourth toe.  No significant purulence or any  drainage noted.  Discoloration noted of the fourth toe with darkening.   L -BKA  MSK:  R -status post prior right fifth partial ray amputation  L -status post BKA  Neuro: R - Gross sensation diminished. Gross motor function intact       ASSESSMENT/PLAN OF CARE 68 y.o. male with PMHx significant for DM2 with severe peripheral arterial disease and history of prior left BKA as well as fifth partial ray amputation with gangrenous changes of the fourth toe of the right foot.   A1c 8.3 WBC 7.4  -Appreciate vascular surgery.  Planning to take patient for angiogram right lower extremity tomorrow. -Further plans regarding fourth toe amputation versus fourth ray amputation versus transmetatarsal amputation versus BKA pending their findings from angiogram. -Earliest amputation surgery would occur is Thursday versus more likely Friday pending OR scheduling. -Okay to hold antibiotics at this time as patient does not show signs of sepsis - Anticoagulation: Per primary/vascular okay to continue - Wound  care: None required preoperatively - WB status: Weightbearing as tolerated to the right foot preoperatively - Will continue to follow   Thank you for the consult.  Please contact me directly with any questions or concerns.           Corinna Gab, DPM [facility] / [facility]    [address] [place], [place] [address]                Office [phone]  Fax [phone]

## 2023-11-04 NOTE — Plan of Care (Signed)

## 2023-11-04 NOTE — Hospital Course (Signed)
Austin [name] is a 68 y.o. male with medical history significant of type 2 diabetes, coronary disease, history of DVT, peripheral vascular disease, essential hypertension, dyslipidemia, who present to the hospital with right fifth toe gangrene.  Podiatry and vascular surgery consulted.

## 2023-11-04 NOTE — Progress Notes (Signed)
  Progress Note   Patient: POWELL HALBERT ZOX:[id number] DOB: 1956-03-30 DOA: 11/03/2023     1 DOS: the patient was seen and examined on 11/04/2023   Brief hospital course: THESEUS BIRNIE is a 68 y.o. male with medical history significant of type 2 diabetes, coronary disease, history of DVT, peripheral vascular disease, essential hypertension, dyslipidemia, who present to the hospital with right fifth toe gangrene.  Podiatry and vascular surgery consulted.   Principal Problem:   Toe gangrene (HCC) Active Problems:   Peripheral vascular disease (HCC)   Essential hypertension   Coronary artery disease   Type 2 diabetes mellitus with diabetic peripheral angiopathy without gangrene (HCC)   Atrial fibrillation, chronic (HCC)   Status post below-knee amputation (HCC) - left side   Diabetes mellitus type 2, uncomplicated (HCC)   Overweight (BMI 25.0-29.9)   Assessment and Plan: Right foot fifth toe dry gangrene. Peripheral arterial disease. Patient has right fifth toe necrosis secondary to peripheral vascular disease.  Patient has been seen by vascular surgery and podiatry, will perform LE angiogram followed by fifth toe amputation. Continue aspirin, Plavix.   Upper respiratory infection. Patient had cough, nasal congestion for a few days.  Negative COVID.  Symptomatic treatment.    History of DVT. Anticipating surgery, hold off anticoagulation for now.  Right leg has some edema, resolved today.  Duplex shows no DVT.   Uncontrolled type 2 diabetes with hyperglycemia on long-term insulin use. Restart insulin glargine at half dose, continue scheduled NovoLog and sliding scale insulin.   Essential hypertension.   Continue some home medicines.   Overweight BMI 28.89. Diet and excise.  Right knee osteoarthritis Patient is some knee pain, x-ray showed osteoarthritis.  Symptomatic treatment.     Subjective:  Complaining of right leg pain and knee pain.  Physical Exam: Vitals:    11/03/23 2204 11/03/23 2341 11/04/23 0435 11/04/23 0802  BP: (!) 166/70 (!) 161/79 138/70 133/89  Pulse: 97 98 83 98  Resp: 20 20 20 18   Temp: 98.4 F (36.9 C) 98.9 F (37.2 C) 98.6 F (37 C) 99 F (37.2 C)  TempSrc: Oral Oral Oral Oral  SpO2: 97% 94% 96% 96%  Weight:      Height:       General exam: Appears calm and comfortable  Respiratory system: Clear to auscultation. Respiratory effort normal. Cardiovascular system: S1 & S2 heard, RRR. No JVD, murmurs, rubs, gallops or clicks.  Gastrointestinal system: Abdomen is nondistended, soft and nontender. No organomegaly or masses felt. Normal bowel sounds heard. Central nervous system: Alert and oriented. No focal neurological deficits. Extremities: Left status post BKA, right leg edema resolved. Skin: No rashes, lesions or ulcers Psychiatry: Judgement and insight appear normal. Mood & affect appropriate.    Data Reviewed:  X-rays and lab results reviewed.  Family Communication: None  Disposition: Status is: Inpatient Remains inpatient appropriate because: Severity disease, inpatient procedure.     Time spent: 35 minutes  Author: Marrion Coy, MD 11/04/2023 1:42 PM  For on call review [email or web address].

## 2023-11-05 ENCOUNTER — Encounter
Admission: EM | Disposition: A | Discharge status: Home / Self Care | Payer: Self-pay | Source: Home / Self Care | Attending: Hospitalist

## 2023-11-05 ENCOUNTER — Encounter: Payer: Self-pay | Admitting: Vascular Surgery

## 2023-11-05 DIAGNOSIS — I70335 Atherosclerosis of unspecified type of bypass graft(s) of the right leg with ulceration of other part of foot: Secondary | ICD-10-CM | POA: Diagnosis not present

## 2023-11-05 DIAGNOSIS — I70235 Atherosclerosis of native arteries of right leg with ulceration of other part of foot: Secondary | ICD-10-CM

## 2023-11-05 DIAGNOSIS — Z9889 Other specified postprocedural states: Secondary | ICD-10-CM | POA: Diagnosis not present

## 2023-11-05 DIAGNOSIS — I96 Gangrene, not elsewhere classified: Secondary | ICD-10-CM | POA: Diagnosis not present

## 2023-11-05 DIAGNOSIS — L97519 Non-pressure chronic ulcer of other part of right foot with unspecified severity: Secondary | ICD-10-CM

## 2023-11-05 DIAGNOSIS — Z95828 Presence of other vascular implants and grafts: Secondary | ICD-10-CM | POA: Diagnosis not present

## 2023-11-05 HISTORY — PX: LOWER EXTREMITY ANGIOGRAPHY: CATH118251

## 2023-11-05 LAB — GLUCOSE, CAPILLARY
Glucose-Capillary: 169 mg/dL — ABNORMAL HIGH (ref 70–99)
Glucose-Capillary: 130 mg/dL — ABNORMAL HIGH (ref 70–99)
Glucose-Capillary: 169 mg/dL — ABNORMAL HIGH (ref 70–99)
Glucose-Capillary: 181 mg/dL — ABNORMAL HIGH (ref 70–99)

## 2023-11-05 SURGERY — LOWER EXTREMITY ANGIOGRAPHY
Anesthesia: Moderate Sedation | Laterality: Right

## 2023-11-05 MED ORDER — MIDAZOLAM HCL 2 MG/2ML IJ SOLN
INTRAMUSCULAR | Status: DC | PRN
  Administered 2023-11-05 (×2): 1 mg via INTRAVENOUS

## 2023-11-05 MED ORDER — MIDAZOLAM HCL 2 MG/ML PO SYRP: 8.0000 mg | ORAL_SOLUTION | Freq: Once | ORAL | Status: DC | PRN

## 2023-11-05 MED ORDER — HEPARIN SODIUM (PORCINE) 1000 UNIT/ML IJ SOLN
INTRAMUSCULAR | Status: DC | PRN
  Administered 2023-11-05: 5000 [IU] via INTRAVENOUS

## 2023-11-05 MED ORDER — ONDANSETRON HCL 4 MG/2ML IJ SOLN: 4.0000 mg | Freq: Four times a day (QID) | INTRAMUSCULAR | Status: DC | PRN

## 2023-11-05 MED ORDER — DIPHENHYDRAMINE HCL 50 MG/ML IJ SOLN: 50.0000 mg | Freq: Once | INTRAMUSCULAR | Status: DC | PRN

## 2023-11-05 MED ORDER — FAMOTIDINE 20 MG PO TABS: 40.0000 mg | ORAL_TABLET | Freq: Once | ORAL | Status: DC | PRN

## 2023-11-05 MED ORDER — HEPARIN SODIUM (PORCINE) 1000 UNIT/ML IJ SOLN
INTRAMUSCULAR | Status: AC
  Filled 2023-11-05: qty 10

## 2023-11-05 MED ORDER — CEFAZOLIN SODIUM-DEXTROSE 2-4 GM/100ML-% IV SOLN
INTRAVENOUS | Status: AC
  Filled 2023-11-05: qty 100

## 2023-11-05 MED ORDER — HEPARIN (PORCINE) IN NACL 1000-0.9 UT/500ML-% IV SOLN
INTRAVENOUS | Status: DC | PRN
  Administered 2023-11-05: 1000 mL

## 2023-11-05 MED ORDER — FENTANYL CITRATE (PF) 100 MCG/2ML IJ SOLN
INTRAMUSCULAR | Status: DC | PRN
  Administered 2023-11-05 (×2): 25 ug via INTRAVENOUS

## 2023-11-05 MED ORDER — LIDOCAINE-EPINEPHRINE (PF) 1 %-1:200000 IJ SOLN
INTRAMUSCULAR | Status: DC | PRN
  Administered 2023-11-05: 10 mL

## 2023-11-05 MED ORDER — FENTANYL CITRATE (PF) 100 MCG/2ML IJ SOLN
INTRAMUSCULAR | Status: AC
  Filled 2023-11-05: qty 2

## 2023-11-05 MED ORDER — FENTANYL CITRATE PF 50 MCG/ML IJ SOSY: 12.5000 ug | PREFILLED_SYRINGE | Freq: Once | INTRAMUSCULAR | Status: DC | PRN

## 2023-11-05 MED ORDER — IODIXANOL 320 MG/ML IV SOLN
INTRAVENOUS | Status: DC | PRN
  Administered 2023-11-05: 45 mL

## 2023-11-05 MED ORDER — METHYLPREDNISOLONE SODIUM SUCC 125 MG IJ SOLR: 125.0000 mg | Freq: Once | INTRAMUSCULAR | Status: DC | PRN

## 2023-11-05 MED ORDER — MIDAZOLAM HCL 5 MG/5ML IJ SOLN
INTRAMUSCULAR | Status: AC
  Filled 2023-11-05: qty 5

## 2023-11-05 SURGICAL SUPPLY — 17 items
BALLN LUTONIX 018 4X150X130 (BALLOONS) ×1
BALLN ULTRVRSE 3X150X150 (BALLOONS) ×1
BALLOON LUTONIX 018 4X150X130 (BALLOONS) IMPLANT
BALLOON ULTRVRSE 3X150X150 (BALLOONS) IMPLANT
CATH ANGIO 5F PIGTAIL 65CM (CATHETERS) IMPLANT
CATH BEACON 5 .035 65 KMP TIP (CATHETERS) IMPLANT
CATH NAVICROSS ANGLED 135CM (MICROCATHETER) IMPLANT
COVER PROBE ULTRASOUND 5X96 (MISCELLANEOUS) IMPLANT
DEVICE PRESTO INFLATION (MISCELLANEOUS) IMPLANT
DEVICE STARCLOSE SE CLOSURE (Vascular Products) IMPLANT
GLIDEWIRE ADV .035X260CM (WIRE) IMPLANT
PACK ANGIOGRAPHY (CUSTOM PROCEDURE TRAY) ×1 IMPLANT
SHEATH BRITE TIP 5FRX11 (SHEATH) IMPLANT
SHEATH RAABE 6FRX70 (SHEATH) IMPLANT
TUBING CONTRAST HIGH PRESS 72 (TUBING) IMPLANT
WIRE G V18X300CM (WIRE) IMPLANT
WIRE GUIDERIGHT .035X150 (WIRE) IMPLANT

## 2023-11-05 NOTE — Progress Notes (Addendum)
Pt back from procedure. Pt is aox4, respirations even and unlabored on RA. Pt L femoral access site visualized with pacu RN, dressing is CDI. Pt aware and acknowledges to be flat till 1330 and bedrest till 1400. Pt has no complaints at this time. Pt lying flat at this time. Vital signs stable

## 2023-11-05 NOTE — Interval H&P Note (Signed)
History and Physical Interval Note:  11/05/2023 10:11 AM  Austin [name]  has presented today for surgery, with the diagnosis of PAD.  The various methods of treatment have been discussed with the patient and family. After consideration of risks, benefits and other options for treatment, the patient has consented to  Procedure(s): Lower Extremity Angiography (Right) as a surgical intervention.  [name] history has been reviewed, patient examined, no change in status, stable for surgery.  I have reviewed the patient's chart and labs.  Questions were answered to the patient's satisfaction.     Festus Barren

## 2023-11-05 NOTE — Op Note (Signed)
[facility]  Percutaneous Study/Intervention Procedural Note   Date of Surgery: 11/05/2023  Surgeon(s):Samari Gorby    Assistants:none  Pre-operative Diagnosis: PAD with ulceration RLE  Post-operative diagnosis:  Same  Procedure(s) Performed:             1.  Ultrasound guidance for vascular access left femoral artery             2.  Catheter placement into right common femoral artery from left femoral approach             3.  Aortogram and selective right lower extremity angiogram             4.  Percutaneous transluminal angioplasty of right peroneal artery and tibioperoneal trunk with 3 mm diameter angioplasty balloon             5.  Percutaneous transluminal angioplasty of the distal portion of the right femoral to tibioperoneal trunk bypass analogous to the popliteal artery with 4 mm diameter by 15 cm length [name] drug-coated angioplasty balloon  6.  StarClose closure device left femoral artery  EBL: 10 cc  Contrast: 45 cc  Fluoro Time: 3.6 minutes  Moderate Conscious Sedation Time: approximately 38 minutes using 2 mg of Versed and 50 mcg of Fentanyl              Indications:  Patient is a 68 y.o.male with an extensive history of peripheral arterial disease status post right femoral to tibioperoneal trunk bypass with recurrent ulceration on the right foot and nonpalpable pedal pulses.  The patient is brought in for angiography for further evaluation and potential treatment.  Due to the limb threatening nature of the situation, angiogram was performed for attempted limb salvage. The patient is aware that if the procedure fails, amputation would be expected.  The patient also understands that even with successful revascularization, amputation may still be required due to the severity of the situation.  Risks and benefits are discussed and informed consent is obtained.   Procedure:  The patient was identified and appropriate procedural time out was performed.   The patient was then placed supine on the table and prepped and draped in the usual sterile fashion. Moderate conscious sedation was administered during a face to face encounter with the patient throughout the procedure with my supervision of the RN administering medicines and monitoring the patient's vital signs, pulse oximetry, telemetry and mental status throughout from the start of the procedure until the patient was taken to the recovery room. Ultrasound was used to evaluate the left common femoral artery.  It was patent .  A digital ultrasound image was acquired.  A Seldinger needle was used to access the left common femoral artery under direct ultrasound guidance and a permanent image was performed.  A 0.035 J wire was advanced without resistance and a 5Fr sheath was placed.  Pigtail catheter was placed into the aorta and an AP aortogram was performed. This demonstrated normal renal arteries and normal aorta and iliac segments without significant stenosis after previous stenting. I then crossed the aortic bifurcation and advanced to the right femoral head. Selective right lower extremity angiogram was then performed. This demonstrated occlusion of the native SFA with a profunda femoris artery providing most collateral blood flow distally although it was somewhat diseased.  The femoral to posterior tibial bypass had very sluggish flow without any obvious stenosis in the proximal to mid segment but we were unable to opacify distally with injections to the  common femoral artery.  I then advanced a Kumpe catheter into the bypass graft and injected distally where the distal portion of the bypass graft was occluded and there was occlusion of the tibioperoneal trunk as its distal target with reconstitution of a large peroneal artery below the proximal segment.  There is also reconstitution of the anterior tibial artery which filled the foot distally as well. It was felt that it was in the patient's best interest to  proceed with intervention after these images to avoid a second procedure and a larger amount of contrast and fluoroscopy based off of the findings from the initial angiogram. The patient was systemically heparinized and a 6 French 70 cm sheath was then placed over the Terumo Advantage wire. I then used a Kumpe catheter and the advantage wire to navigate through the occluded femoral to tibioperoneal trunk bypass without difficulty and get a wire down into the mid to distal peroneal artery were then exchanged with a Nava cross catheter for a V18.  I then proceeded with treatment.  A 3 mm diameter by 15 cm length angioplasty balloon was inflated in the peroneal artery and tibioperoneal trunk.  This was taken 8 atm for 1 minute.  A 4 mm diameter by 15 cm length [name] drug-coated angioplasty balloon was used to treat the distal portion of the femoral to tibioperoneal trunk bypass graft analogous to the popliteal artery.  This was inflated to 10 atm for 1 minute.  Completion imaging showed marked improvement even with the sheath pulled back to the common femoral artery and now briskly filled the bypass graft and the peroneal artery.  There appeared to be less than 30% residual stenosis in all areas treated. I elected to terminate the procedure. The sheath was removed and StarClose closure device was deployed in the left femoral artery with excellent hemostatic result. The patient was taken to the recovery room in stable condition having tolerated the procedure well.  Findings:               Aortogram:  This demonstrated normal renal arteries and normal aorta and iliac segments without significant stenosis after previous stenting.             Right Lower Extremity:  This demonstrated occlusion of the native SFA with a profunda femoris artery providing most collateral blood flow distally although it was somewhat diseased.  The femoral to posterior tibial bypass had very sluggish flow without any obvious stenosis in  the proximal to mid segment but we were unable to opacify distally with injections to the common femoral artery.  I then advanced a Kumpe catheter into the bypass graft and injected distally where the distal portion of the bypass graft was occluded and there was occlusion of the tibioperoneal trunk as its distal target with reconstitution of a large peroneal artery below the proximal segment.  There is also reconstitution of the anterior tibial artery which filled the foot distally as well.   Disposition: Patient was taken to the recovery room in stable condition having tolerated the procedure well.  Complications: None  Festus Barren 11/05/2023 12:02 PM   This note was created with Dragon Medical transcription system. Any errors in dictation are purely unintentional.

## 2023-11-05 NOTE — Plan of Care (Signed)
NPO past MN for OR tomorrow for right 4th toe amputation  Discussed with patient on phone he is aware and agrees to proceed.        Corinna Gab, DPM [facility] / [facility]                   11/05/2023

## 2023-11-05 NOTE — TOC CM/SW Note (Signed)
Transition of Care [facility]) - Inpatient Brief Assessment   Patient Details  Name: Austin [name] MRN: [id number] Date of Birth: 12-15-1955  [facility] of [facility]) CM/SW Contact:    Margarito Liner, LCSW Phone Number: 11/05/2023, 2:25 PM   Clinical Narrative: CSW reviewed chart. No TOC needs so far. CSW will continue to follow progress. Please place [facility] consult if any needs arise.  Transition of Care Asessment: Insurance and Status: Insurance coverage has been reviewed Patient has primary care physician: Yes Home environment has been reviewed: Mobile home Prior level of function:: Not documented Prior/Current Home Services: No current home services Social Drivers of Health Review: SDOH reviewed no interventions necessary Readmission risk has been reviewed: Yes Transition of care needs: no transition of care needs at this time

## 2023-11-05 NOTE — Plan of Care (Signed)
Problem: Metabolic: Goal: Ability to maintain appropriate glucose levels will improve Outcome: Progressing   Problem: Skin Integrity: Goal: Risk for impaired skin integrity will decrease Outcome: Progressing   Problem: Education: Goal: Knowledge of General Education information will improve Description: Including pain rating scale, medication(s)/side effects and non-pharmacologic comfort measures Outcome: Progressing

## 2023-11-05 NOTE — Progress Notes (Signed)
  PROGRESS NOTE    Austin [name]  WUJ:[id number] DOB: 1956/03/16 DOA: 11/03/2023 PCP: Preston Fleeting, MD  211A/211A-AA  LOS: 2 days   Brief hospital course:   Assessment & Plan: Austin [name] is a 68 y.o. male with medical history significant of type 2 diabetes, coronary disease, history of DVT, peripheral vascular disease, essential hypertension, dyslipidemia, who present to the hospital with right 4th toe gangrene.  Podiatry and vascular surgery consulted.    Right foot 4th toe dry gangrene. Peripheral arterial disease. --angiogram today with vascular intervention. --plan for 4th toe amputation tomorrow Continue aspirin, Plavix.   Upper respiratory infection. Patient had cough, nasal congestion for a few days.  Negative COVID.  Symptomatic treatment.    History of DVT. Right leg had some edema, resolved.  Duplex shows no DVT. --hold off anticoagulation    Uncontrolled type 2 diabetes with hyperglycemia on long-term insulin use. --cont glargine 40u nightly --mealtime 6u TID --ACHS and SSI   Essential hypertension.   --cont losartan   Overweight BMI 28.89. Diet and excise.   Right knee osteoarthritis Patient is some knee pain, x-ray showed osteoarthritis.  Symptomatic treatment.   DVT prophylaxis: Lovenox SQ Code Status: Full code  Family Communication:  Level of care: Med-Surg Dispo:   The patient is from: home Anticipated d/c is to: home Anticipated d/c date is: 1-2 days   Subjective and Interval History:  Pt went for angiogram and intervention today, tolerated it well.   Objective: Vitals:   11/05/23 1215 11/05/23 1230 11/05/23 1255 11/05/23 1552  BP: 120/60 113/68 113/73 115/73  Pulse: 92 91 78 86  Resp: 16 (!) 30 17 16   Temp:   97.6 F (36.4 C) 97.8 F (36.6 C)  TempSrc:   Oral Oral  SpO2: 95% 92% 93% 97%  Weight:      Height:        Intake/Output Summary (Last 24 hours) at 11/05/2023 1835 Last data filed at 11/05/2023  1500 Gross per 24 hour  Intake 482.85 ml  Output 1250 ml  Net -767.15 ml   Filed Weights   11/03/23 1209  Weight: 113.4 kg    Examination:   Constitutional: NAD, AAOx3 HEENT: conjunctivae and lids normal, EOMI CV: No cyanosis.   RESP: normal respiratory effort, on RA Extremities: left amputation Neuro: II - XII grossly intact.     Data Reviewed: I have personally reviewed labs and imaging studies  Time spent: 35 minutes  Darlin Priestly, MD [facility] If 7PM-7AM, please contact night-coverage 11/05/2023, 6:35 PM

## 2023-11-06 ENCOUNTER — Encounter: Payer: Self-pay | Admitting: Internal Medicine

## 2023-11-06 ENCOUNTER — Inpatient Hospital Stay: Payer: 59 | Admitting: Anesthesiology

## 2023-11-06 ENCOUNTER — Inpatient Hospital Stay: Payer: 59

## 2023-11-06 ENCOUNTER — Encounter
Admission: EM | Disposition: A | Discharge status: Home / Self Care | Payer: Self-pay | Source: Home / Self Care | Attending: Hospitalist

## 2023-11-06 DIAGNOSIS — I96 Gangrene, not elsewhere classified: Secondary | ICD-10-CM | POA: Diagnosis not present

## 2023-11-06 HISTORY — PX: AMPUTATION TOE: SHX6595

## 2023-11-06 LAB — BASIC METABOLIC PANEL
Anion gap: 11 (ref 5–15)
BUN: 15 mg/dL (ref 8–23)
CO2: 25 mmol/L (ref 22–32)
Calcium: 9 mg/dL (ref 8.9–10.3)
Chloride: 102 mmol/L (ref 98–111)
Creatinine, Ser: 0.77 mg/dL (ref 0.61–1.24)
GFR, Estimated: >60 mL/min (ref >60)
Glucose, Bld: 152 mg/dL — ABNORMAL HIGH (ref 70–99)
Potassium: 4.2 mmol/L (ref 3.5–5.1)
Sodium: 138 mmol/L (ref 135–145)

## 2023-11-06 LAB — GLUCOSE, CAPILLARY
Glucose-Capillary: 128 mg/dL — ABNORMAL HIGH (ref 70–99)
Glucose-Capillary: 130 mg/dL — ABNORMAL HIGH (ref 70–99)
Glucose-Capillary: 128 mg/dL — ABNORMAL HIGH (ref 70–99)
Glucose-Capillary: 228 mg/dL — ABNORMAL HIGH (ref 70–99)
Glucose-Capillary: 192 mg/dL — ABNORMAL HIGH (ref 70–99)

## 2023-11-06 LAB — CBC
HCT: 41.4 % (ref 39.0–52.0)
Hemoglobin: 13.9 g/dL (ref 13.0–17.0)
MCH: 27.6 pg (ref 26.0–34.0)
MCHC: 33.6 g/dL (ref 30.0–36.0)
MCV: 82.1 fL (ref 80.0–100.0)
Platelets: 174 10*3/uL (ref 150–400)
RBC: 5.04 MIL/uL (ref 4.22–5.81)
RDW: 14.6 % (ref 11.5–15.5)
WBC: 4.9 10*3/uL (ref 4.0–10.5)
nRBC: 0 % (ref 0.0–0.2)

## 2023-11-06 LAB — MAGNESIUM: Magnesium: 2 mg/dL (ref 1.7–2.4)

## 2023-11-06 SURGERY — AMPUTATION, TOE
Anesthesia: General | Site: Toe | Laterality: Right

## 2023-11-06 MED ORDER — FENTANYL CITRATE (PF) 100 MCG/2ML IJ SOLN
INTRAMUSCULAR | Status: AC
  Filled 2023-11-06: qty 2

## 2023-11-06 MED ORDER — FENTANYL CITRATE (PF) 100 MCG/2ML IJ SOLN
INTRAMUSCULAR | Status: DC | PRN
  Administered 2023-11-06: 25 ug via INTRAVENOUS

## 2023-11-06 MED ORDER — 0.9 % SODIUM CHLORIDE (POUR BTL) OPTIME
TOPICAL | Status: DC | PRN
  Administered 2023-11-06: 500 mL

## 2023-11-06 MED ORDER — ONDANSETRON HCL 4 MG/2ML IJ SOLN
INTRAMUSCULAR | Status: DC | PRN
  Administered 2023-11-06: 4 mg via INTRAVENOUS

## 2023-11-06 MED ORDER — MIDAZOLAM HCL 2 MG/2ML IJ SOLN
INTRAMUSCULAR | Status: AC
  Filled 2023-11-06: qty 2

## 2023-11-06 MED ORDER — MIDAZOLAM HCL 2 MG/2ML IJ SOLN
INTRAMUSCULAR | Status: DC | PRN
  Administered 2023-11-06: 2 mg via INTRAVENOUS

## 2023-11-06 MED ORDER — PHENYLEPHRINE 80 MCG/ML (10ML) SYRINGE FOR IV PUSH (FOR BLOOD PRESSURE SUPPORT)
PREFILLED_SYRINGE | INTRAVENOUS | Status: DC | PRN
  Administered 2023-11-06 (×2): 80 ug via INTRAVENOUS

## 2023-11-06 MED ORDER — SODIUM CHLORIDE 0.9 % IV SOLN: INTRAVENOUS | Status: DC | PRN

## 2023-11-06 MED ORDER — LIDOCAINE HCL (PF) 1 % IJ SOLN
INTRAMUSCULAR | Status: AC
  Filled 2023-11-06: qty 30

## 2023-11-06 MED ORDER — PROPOFOL 500 MG/50ML IV EMUL
INTRAVENOUS | Status: DC | PRN
  Administered 2023-11-06: 30 mg via INTRAVENOUS
  Administered 2023-11-06: 100 ug/kg/min via INTRAVENOUS

## 2023-11-06 MED ORDER — LIDOCAINE HCL 1 % IJ SOLN
INTRAMUSCULAR | Status: DC | PRN
  Administered 2023-11-06: 10 mL

## 2023-11-06 MED ORDER — OXYCODONE HCL 5 MG PO TABS: 5.0000 mg | ORAL_TABLET | Freq: Once | ORAL | Status: DC | PRN

## 2023-11-06 MED ORDER — OXYCODONE HCL 5 MG/5ML PO SOLN: 5.0000 mg | Freq: Once | ORAL | Status: DC | PRN

## 2023-11-06 MED ORDER — BUPIVACAINE HCL (PF) 0.5 % IJ SOLN
INTRAMUSCULAR | Status: AC
  Filled 2023-11-06: qty 30

## 2023-11-06 MED ORDER — LIDOCAINE HCL (CARDIAC) PF 100 MG/5ML IV SOSY
PREFILLED_SYRINGE | INTRAVENOUS | Status: DC | PRN
  Administered 2023-11-06: 50 mg via INTRAVENOUS

## 2023-11-06 MED ORDER — FENTANYL CITRATE (PF) 100 MCG/2ML IJ SOLN: 25.0000 ug | INTRAMUSCULAR | Status: DC | PRN

## 2023-11-06 SURGICAL SUPPLY — 41 items
BLADE OSC/SAGITTAL MD 5.5X18 (BLADE) IMPLANT
BLADE SURG 15 STRL LF DISP TIS (BLADE) ×2 IMPLANT
BLADE SURG MINI STRL (BLADE) ×1 IMPLANT
BNDG ELASTIC 4INX 5YD STR LF (GAUZE/BANDAGES/DRESSINGS) ×1 IMPLANT
BNDG ELASTIC 4X5.8 VLCR NS LF (GAUZE/BANDAGES/DRESSINGS) IMPLANT
BNDG GAUZE DERMACEA FLUFF 4 (GAUZE/BANDAGES/DRESSINGS) IMPLANT
CNTNR URN SCR LID CUP LEK RST (MISCELLANEOUS) IMPLANT
CUFF TOURN SGL QUICK 12 (TOURNIQUET CUFF) IMPLANT
CUFF TOURN SGL QUICK 18X4 (TOURNIQUET CUFF) IMPLANT
DRAPE FLUOR MINI C-ARM 54X84 (DRAPES) IMPLANT
DRSG EMULSION OIL 3X3 NADH (GAUZE/BANDAGES/DRESSINGS) IMPLANT
DRSG EMULSION OIL 3X8 NADH (GAUZE/BANDAGES/DRESSINGS) IMPLANT
DRSG XEROFORM 1X8 (GAUZE/BANDAGES/DRESSINGS) IMPLANT
DURAPREP 26ML APPLICATOR (WOUND CARE) ×1 IMPLANT
GAUZE PAD ABD 8X10 STRL (GAUZE/BANDAGES/DRESSINGS) ×1 IMPLANT
GAUZE SPONGE 4X4 12PLY STRL (GAUZE/BANDAGES/DRESSINGS) ×2 IMPLANT
GAUZE STRETCH 2X75IN STRL (MISCELLANEOUS) IMPLANT
GLOVE BIOGEL PI IND STRL 7.5 (GLOVE) ×1 IMPLANT
GLOVE SURG SYN 7.5 E (GLOVE) ×1 IMPLANT
GLOVE SURG SYN 7.5 PF PI (GLOVE) ×1 IMPLANT
GOWN STRL REUS W/ TWL LRG LVL3 (GOWN DISPOSABLE) IMPLANT
GOWN STRL REUS W/ TWL XL LVL3 (GOWN DISPOSABLE) ×1 IMPLANT
HANDPIECE VERSAJET DEBRIDEMENT (MISCELLANEOUS) IMPLANT
IV NS IRRIG 3000ML ARTHROMATIC (IV SOLUTION) IMPLANT
KIT TURNOVER KIT A (KITS) ×1 IMPLANT
LABEL OR SOLS (LABEL) ×1 IMPLANT
MANIFOLD NEPTUNE II (INSTRUMENTS) ×1 IMPLANT
NDL FILTER BLUNT 18X1 1/2 (NEEDLE) ×1 IMPLANT
NDL HYPO 25X1 1.5 SAFETY (NEEDLE) ×2 IMPLANT
NEEDLE FILTER BLUNT 18X1 1/2 (NEEDLE) ×1 IMPLANT
NEEDLE HYPO 25X1 1.5 SAFETY (NEEDLE) ×2 IMPLANT
NS IRRIG 500ML POUR BTL (IV SOLUTION) ×1 IMPLANT
PACK EXTREMITY ARMC (MISCELLANEOUS) ×1 IMPLANT
PAD ABD DERMACEA PRESS 5X9 (GAUZE/BANDAGES/DRESSINGS) ×1 IMPLANT
STAPLER SKIN PROX 35W (STAPLE) ×1 IMPLANT
SUT PROLENE 3 0 PS 2 (SUTURE) IMPLANT
SWAB CULTURE AMIES ANAERIB BLU (MISCELLANEOUS) IMPLANT
SYR 10ML LL (SYRINGE) ×1 IMPLANT
TIP BRUSH PULSAVAC PLUS 24.33 (MISCELLANEOUS) IMPLANT
TRAP FLUID SMOKE EVACUATOR (MISCELLANEOUS) ×1 IMPLANT
WATER STERILE IRR 500ML POUR (IV SOLUTION) ×1 IMPLANT

## 2023-11-06 NOTE — Progress Notes (Signed)
Progress Note    11/06/2023 8:46 AM * Day of Surgery *  Subjective:  Austin [name] is a 68 yo male now POD #1 from aortogram with selective right lower extremity angiogram.  Patient had angioplasty of the right peroneal artery and tibioperoneal trunk as well as angioplasty of the distal portion of the right femoral to tibioperoneal trunk.  Patient is resting comfortably this morning.  Patient just returned to the floor from the operating room.  Patient just underwent fourth toe MPJ level amputation by Dr. [name].  Patient had no complaints overnight vitals are remained stable.   Vitals:   11/06/23 0816 11/06/23 0841  BP: (!) 155/77 128/79  Pulse: 92 81  Resp: 18 17  Temp: 98.3 F (36.8 C)   SpO2: 96% 96%   Physical Exam: Cardiac:  RRR, normal S1 and S2.  No murmurs rubs clicks or gallops. Lungs: Lungs clear on auscultation throughout, normal respiratory effort.  No rales rhonchi or wheezing. Incisions: Left groin puncture site with dressing clean dry and intact.  No hematoma seroma to note. Extremities: Bilateral lower extremities warm to touch.  Left lower extremity with dressing intact now that he is post toe amputation.  I did not remove this today. Abdomen: Positive bowel sounds throughout, soft, nontender, nondistended. Neurologic: Alert and oriented x 3, answers all questions follows commands appropriately.  CBC    Component Value Date/Time   WBC 4.9 11/06/2023 0557   RBC 5.04 11/06/2023 0557   HGB 13.9 11/06/2023 0557   HGB 14.1 11/17/2013 0621   HCT 41.4 11/06/2023 0557   HCT 41.0 02/16/2022 0608   PLT 174 11/06/2023 0557   PLT 218 11/17/2013 0621   MCV 82.1 11/06/2023 0557   MCV 86 11/17/2013 0621   MCH 27.6 11/06/2023 0557   MCHC 33.6 11/06/2023 0557   RDW 14.6 11/06/2023 0557   RDW 13.6 11/17/2013 0621   LYMPHSABS 2.1 11/03/2023 1213   LYMPHSABS 2.4 11/17/2013 0621   MONOABS 1.1 (H) 11/03/2023 1213   MONOABS 0.9 11/17/2013 0621   EOSABS 0.0  11/03/2023 1213   EOSABS 0.1 11/17/2013 0621   BASOSABS 0.0 11/03/2023 1213   BASOSABS 0.0 11/17/2013 0621    BMET    Component Value Date/Time   NA 138 11/06/2023 0557   NA 135 (L) 11/17/2013 0621   K 4.2 11/06/2023 0557   K 4.3 11/17/2013 0621   CL 102 11/06/2023 0557   CL 99 11/17/2013 0621   CO2 25 11/06/2023 0557   CO2 31 11/17/2013 0621   GLUCOSE 152 (H) 11/06/2023 0557   GLUCOSE 172 (H) 11/17/2013 0621   BUN 15 11/06/2023 0557   BUN 10 11/17/2013 0621   CREATININE 0.77 11/06/2023 0557   CREATININE 1.05 04/18/2022 1034   CALCIUM 9.0 11/06/2023 0557   CALCIUM 9.6 11/17/2013 0621   GFRNONAA >60 11/06/2023 0557   GFRNONAA >60 11/17/2013 0621   GFRAA >60 01/10/2020 0808   GFRAA >60 11/17/2013 0621    INR    Component Value Date/Time   INR 1.2 06/25/2023 1812     Intake/Output Summary (Last 24 hours) at 11/06/2023 0846 Last data filed at 11/06/2023 1610 Gross per 24 hour  Intake 240 ml  Output 1400 ml  Net -1160 ml     Assessment/Plan:  68 y.o. male is s/p aortogram with selective right lower extremity angiogram.  Patient had angioplasty of the right peroneal artery and tibioperoneal trunk as well as angioplasty of the distal portion of the right femoral  to tibioperoneal trunk. * Day of Surgery *   PLAN Okay per vascular surgery for patient to discharge when medically stable.  Patient will need to take aspirin 81 mg daily, Plavix 75 mg daily and Lipitor 80 mg daily.  Patient was instructed if he misses any of these medications we will interfere with the outcome of his angiogram. Patient taken to the operating room by podiatry today for toe amputation.  Follow-up with podiatry for reinstituting patient's dual antiplatelet regime. Vascular surgery to sign off this case at this time.  DVT prophylaxis: Lovenox 55 mg subcu every 24 hours.   Marcie [facility] 11/06/2023 8:46 AM

## 2023-11-06 NOTE — Transfer of Care (Signed)
Immediate Anesthesia Transfer of Care Note  Patient: Austin [name]  Procedure(s) Performed: Right 4th Toe Amputation (Right: Toe)  Patient Location: PACU  Anesthesia Type:General  Level of Consciousness: drowsy  Airway & Oxygen Therapy: Patient Spontanous Breathing  Post-op Assessment: Report given to RN and Post -op Vital signs reviewed and stable  Post vital signs: Reviewed and stable  Last Vitals:  Vitals Value Taken Time  BP 103/67   Temp    Pulse 104 11/06/23 1008  Resp 9   SpO2 96 % 11/06/23 1008  Vitals shown include unfiled device data.  Last Pain:  Vitals:   11/06/23 0841  TempSrc:   PainSc: 0-No pain         Complications: No notable events documented.

## 2023-11-06 NOTE — Progress Notes (Signed)
  PROGRESS NOTE    Austin [name]  NGE:[id number] DOB: [birth year] DOA: 11/03/2023 PCP: Preston Fleeting, MD  211A/211A-AA  LOS: 3 days   Brief hospital course:   Assessment & Plan: Austin [name] is a 68 y.o. male with medical history significant of type 2 diabetes, coronary disease, history of DVT, peripheral vascular disease, essential hypertension, dyslipidemia, who present to the hospital with right 4th toe gangrene.  Podiatry and vascular surgery consulted.    Right foot 4th toe dry gangrene. Peripheral arterial disease. --angiogram with vascular intervention on 1/29 --4th toe amputation today Continue aspirin, Plavix. --cont statin   Upper respiratory infection. Patient had cough, nasal congestion for a few days.  Negative COVID.  Symptomatic treatment.    History of DVT. Right leg had some edema, resolved.  Duplex shows no DVT. --resume anticoagulation tomorrow   Uncontrolled type 2 diabetes with hyperglycemia on long-term insulin use. --cont glargine 40u nightly --mealtime 6u TID --ACHS and SSI   Essential hypertension.   --cont losartan   Overweight BMI 28.89. Diet and excise.   Right knee osteoarthritis Patient is some knee pain, x-ray showed osteoarthritis.  Symptomatic treatment.   DVT prophylaxis: Lovenox SQ Code Status: Full code  Family Communication:  Level of care: Med-Surg Dispo:   The patient is from: home Anticipated d/c is to: home Anticipated d/c date is: 1-2 days   Subjective and Interval History:  Underwent toe amputation today.  Reported foot swelling afterwards.   Objective: Vitals:   11/06/23 1030 11/06/23 1040 11/06/23 1055 11/06/23 1723  BP: (!) 107/59  99/65 135/70  Pulse: (!) 103 87 93 96  Resp: (!) 33 (!) 26 16 18   Temp:   98.2 F (36.8 C) 98.2 F (36.8 C)  TempSrc:   Oral   SpO2: 96% 97% 95% 94%  Weight:      Height:        Intake/Output Summary (Last 24 hours) at 11/06/2023 1725 Last data filed at  11/06/2023 1003 Gross per 24 hour  Intake 390 ml  Output 1102 ml  Net -712 ml   Filed Weights   11/03/23 1209  Weight: 113.4 kg    Examination:   Constitutional: NAD, AAOx3 HEENT: conjunctivae and lids normal, EOMI CV: No cyanosis.   RESP: normal respiratory effort, on RA Extremities: ACE wrap over right foot SKIN: warm, dry Neuro: II - XII grossly intact.   Psych: Normal mood and affect.  Appropriate judgement and reason    Data Reviewed: I have personally reviewed labs and imaging studies  Time spent: 35 minutes  Darlin Priestly, MD [facility] If 7PM-7AM, please contact night-coverage 11/06/2023, 5:25 PM

## 2023-11-06 NOTE — Anesthesia Preprocedure Evaluation (Signed)
Anesthesia Evaluation  Patient identified by MRN, date of birth, ID band Patient awake    Reviewed: Allergy & Precautions, NPO status , Patient's Chart, lab work & pertinent test results  History of Anesthesia Complications Negative for: history of anesthetic complications  Airway Mallampati: III  TM Distance: >3 FB Neck ROM: full    Dental  (+) Chipped, Poor Dentition, Missing   Pulmonary neg pulmonary ROS, neg shortness of breath, neg pneumonia , former smoker   Pulmonary exam normal        Cardiovascular Exercise Tolerance: Good hypertension, (-) angina + CAD and + Peripheral Vascular Disease  Normal cardiovascular exam     Neuro/Psych negative neurological ROS  negative psych ROS   GI/Hepatic Neg liver ROS,GERD  Controlled,,  Endo/Other  negative endocrine ROSdiabetes, Type 2    Renal/GU negative Renal ROS  negative genitourinary   Musculoskeletal   Abdominal   Peds  Hematology negative hematology ROS (+)   Anesthesia Other Findings Past Medical History: No date: Coronary artery disease No date: Diabetes (HCC) No date: DVT (deep venous thrombosis) (HCC) 02/09/2018: Gangrene of left foot (HCC) No date: GERD (gastroesophageal reflux disease) No date: Hyperlipidemia No date: Hypertension No date: Peripheral vascular disease (HCC)  Past Surgical History: 02/20/2022: ABDOMINAL AORTOGRAM W/LOWER EXTREMITY; N/A     Comment:  Procedure: ABDOMINAL AORTOGRAM W/LOWER EXTREMITY;                Surgeon: Cephus Shelling, MD;  [facility];  Service: Cardiovascular;  Laterality: N/A; 10/28/2018: AMPUTATION; Left     Comment:  Procedure: AMPUTATION BELOW KNEE;  Surgeon: Annice Needy, MD;  Location: [facility];  Service: Vascular;                Laterality: Left; 09/10/2021: AMPUTATION; Left     Comment:  Procedure: AMPUTATION DIGIT;  Surgeon: Allena Napoleon,                MD;  Location: [facility];  Service: Plastics;  Laterality:               Left; 09/14/2021: AMPUTATION; Left     Comment:  Procedure: AMPUTATION DIGIT left long finger and               irrigation and debridement;  Surgeon: Allena Napoleon,               MD;  Location: [facility];  Service: Plastics;  Laterality:               Left; 02/27/2022: AMPUTATION; Right     Comment:  Procedure: RIGHT PARTIAL AMPUTATION FOOT;  Surgeon:               Felecia Shelling, DPM;  Location: [facility];  Service:               Podiatry;  Laterality: Right; 02/10/2018: AMPUTATION TOE; Left     Comment:  Procedure: AMPUTATION TOE;  Surgeon: Linus Galas, DPM;                Location: [facility];  Service: Podiatry;  Laterality:               Left; 10/16/2018: APPLICATION OF WOUND VAC; Left     Comment:  Procedure: APPLICATION OF WOUND VAC;  Surgeon: Gwyneth Revels, DPM;  Location: [facility];  Service: Podiatry;                Laterality: Left; 01/12/2021: DORSAL SLIT; N/A     Comment:  Procedure: DORSAL SLIT;  Surgeon: Sondra Come, MD;               Location: [facility];  Service: Urology;  Laterality: N/A; 02/25/2022: FEMORAL-TIBIAL BYPASS GRAFT; Right     Comment:  Procedure: RIGHT LOWER EXTREMITY FEMORAL TO TIBIAL               PERONEAL TRUNK BYPASS;  Surgeon: Cephus Shelling,               MD;  [facility]: [facility];  Service: Vascular;  Laterality:               Right;  INSERT ARTERIAL LINE No date: groin surgery 09/30/2018: IRRIGATION AND DEBRIDEMENT FOOT; Left     Comment:  Procedure: IRRIGATION AND DEBRIDEMENT FOOT;  Surgeon:               Linus Galas, DPM;  Location: [facility];  Service:               Podiatry;  Laterality: Left; 02/12/2018: LOWER EXTREMITY ANGIOGRAPHY; Left     Comment:  Procedure: Lower Extremity Angiography;  Surgeon: Annice Needy, MD;  Location: [facility];  Service:               Cardiovascular;  Laterality: Left; 10/01/2018: LOWER EXTREMITY ANGIOGRAPHY;  Left     Comment:  Procedure: Lower Extremity Angiography;  Surgeon: Annice Needy, MD;  Location: [facility];  Service:               Cardiovascular;  Laterality: Left; 10/19/2018: LOWER EXTREMITY ANGIOGRAPHY; Left     Comment:  Procedure: Lower Extremity Angiography;  Surgeon: Annice Needy, MD;  Location: [facility];  Service:               Cardiovascular;  Laterality: Left; 10/20/2018: LOWER EXTREMITY ANGIOGRAPHY; Left     Comment:  Procedure: LOWER EXTREMITY ANGIOGRAPHY;  Surgeon: Annice Needy, MD;  Location: [facility];  Service:               Cardiovascular;  Laterality: Left; 11/25/2018: LOWER EXTREMITY ANGIOGRAPHY; Right     Comment:  Procedure: LOWER EXTREMITY ANGIOGRAPHY;  Surgeon: Annice Needy, MD;  Location: [facility];  Service:               Cardiovascular;  Laterality: Right; 01/10/2020: LOWER EXTREMITY ANGIOGRAPHY; Right     Comment:  Procedure: LOWER EXTREMITY ANGIOGRAPHY;  Surgeon: Annice Needy, MD;  Location: [facility];  Service:               Cardiovascular;  Laterality: Right; 12/11/2020: LOWER  EXTREMITY ANGIOGRAPHY; Right     Comment:  Procedure: LOWER EXTREMITY ANGIOGRAPHY;  Surgeon: Annice Needy, MD;  Location: [facility];  Service:               Cardiovascular;  Laterality: Right; 06/26/2023: LOWER EXTREMITY ANGIOGRAPHY; Right     Comment:  Procedure: Lower Extremity Angiography;  Surgeon:               Renford Dills, MD;  Location: [facility];               Service: Cardiovascular;  Laterality: Right; 11/05/2023: LOWER EXTREMITY ANGIOGRAPHY; Right     Comment:  Procedure: Lower Extremity Angiography;  Surgeon: Annice Needy, MD;  Location: [facility];  Service:               Cardiovascular;  Laterality: Right; 02/12/2018: LOWER EXTREMITY INTERVENTION     Comment:  Procedure: LOWER EXTREMITY  INTERVENTION;  Surgeon: Annice Needy, MD;  Location: [facility];  Service:               Cardiovascular;; 09/10/2021: MINOR IRRIGATION AND DEBRIDEMENT OF WOUND; Left     Comment:  Procedure: IRRIGATION AND DEBRIDEMENT OF LEFT LONG               FINGER WOUND;  Surgeon: Allena Napoleon, MD;  Location:               [facility];  Service: Plastics;  Laterality: Left; No date: NECK SURGERY 02/20/2022: PERIPHERAL VASCULAR INTERVENTION     Comment:  Procedure: PERIPHERAL VASCULAR INTERVENTION;  Surgeon:               Cephus Shelling, MD;  [facility];               Service: Cardiovascular;;  right iliac 10/16/2018: TRANSMETATARSAL AMPUTATION; Left     Comment:  Procedure: TRANSMETATARSAL AMPUTATION LEFT FOOT;                Surgeon: Gwyneth Revels, DPM;  Location: [facility];                Service: Podiatry;  Laterality: Left; 02/25/2022: VEIN HARVEST; Right     Comment:  Procedure: VEIN HARVEST OF RIGHT GREATER SAPHENOUS VEIN;              Surgeon: Cephus Shelling, MD;  Location: [facility];                Service: Vascular;  Laterality: Right;  BMI    Body Mass Index: 28.89 kg/m      Reproductive/Obstetrics negative OB ROS                             Anesthesia Physical Anesthesia Plan  ASA: 3  Anesthesia Plan: General   Post-op Pain Management:    Induction: Intravenous  PONV Risk Score and Plan: Propofol infusion and TIVA  Airway Management Planned: Natural Airway and Nasal Cannula  Additional Equipment:   Intra-op Plan:   Post-operative Plan:   Informed Consent: I have reviewed the patients History and Physical, chart, labs and discussed  the procedure including the risks, benefits and alternatives for the proposed anesthesia with the patient or authorized representative who has indicated his/her understanding and acceptance.     Dental Advisory Given  Plan Discussed with: Anesthesiologist, CRNA and  Surgeon  Anesthesia Plan Comments: (Patient consented for risks of anesthesia including but not limited to:  - adverse reactions to medications - risk of airway placement if required - damage to eyes, teeth, lips or other oral mucosa - nerve damage due to positioning  - sore throat or hoarseness - Damage to heart, brain, nerves, lungs, other parts of body or loss of life  Patient voiced understanding and assent.)       Anesthesia Quick Evaluation

## 2023-11-06 NOTE — Op Note (Signed)
Full Operative Report  Date of Operation: 10:25 AM, 11/06/2023   Patient: Austin [name] - 68 y.o. male  Surgeon: Pilar Plate, DPM   Assistant: None  Diagnosis: Gangrene right foot  Procedure:  1. Amputation of fourth toe at MPJ level, right foot    Anesthesia: General  No responsible provider has been recorded for the case.  Anesthesiologist: Piscitello, Cleda Mccreedy, MD CRNA: Jeanine Luz, CRNA   Estimated Blood Loss: Minimal   Hemostasis: 1) Anatomical dissection, mechanical compression, electrocautery 2) No tourniquet was used  Implants: * No implants in log *  Materials: Prolene 3-0  Injectables: 1) Pre-operatively: 10 cc of 50:50 mixture 1%lidocaine plain and 0.5% marcaine plain 2) Post-operatively: None   Specimens: Pathology: 4th toe  Microbiology: none   Antibiotics: IV antibiotics given per schedule on the floor  Drains: None  Complications: Patient tolerated the procedure well without complication.   Operative findings: As below in detailed report  Indications for Procedure: Austin [name] presents to Pilar Plate, DPM with a chief complaint of gangrene of the right 4th toe. S/p vascular procedure yesterday. The patient has failed conservative treatments of various modalities. At this time the patient has elected to proceed with surgical correction. All alternatives, risks, and complications of the procedures were thoroughly explained to the patient. Patient exhibits appropriate understanding of all discussion points and informed consent was signed and obtained in the chart with no guarantees to surgical outcome given or implied.  Description of Procedure: Patient was brought to the operating room. Patient remained on their hospital bed in the supine position. A surgical timeout was performed and all members of the operating room, the procedure, and the surgical site were identified. anesthesia occurred as per anesthesia record.  Local anesthetic as previously described was then injected about the operative field in a local infiltrative block.  The operative lower extremity as noted above was then prepped and draped in the usual sterile manner. The following procedure then began.  Attention was directed to the fourth digit on the right foot. A full-thickness incision encompassing the entire digit was made using a #15 blade. Dissection was carried down to bone. The toe was secured with a towel clamp, further dissected in its entirety, and disarticulated at the MPJ and passed to the back table as a gross specimen. This was then labled and sent to pathology. The bone was noted to be soft and eroded, and consistent with osteomyelitis. All remaining necrotic and devitalized soft tissue structures were visualized and dissected away using sharp and dull dissection. Care was taken to protect all neurovascular structures throughout the dissection. Minimal bleeding was noted from the amp site. The area was then flushed with copious amounts of sterile saline. Then using the suture materials previously described, the site was closed in anatomic layers and the skin was well approximated under minimal tension.   The surgical site was then dressed with xeroform 4x4 kerlix ace wrap. The patient tolerated both the procedure and anesthesia well with vital signs stable throughout. The patient was transferred in good condition and all vital signs stable  from the OR to recovery under the discretion of anesthesia.  Condition: Vital signs stable, neurovascular status unchanged from preoperative   Surgical plan:  Expect clean margins. Minimal bleeding noted at the amputation site, guarded prognosis for amp site healing. WBAT in post op shoe to right foot. Rec 7 days augmentin on dc. Follow up in [place] office in 7 days.   The  patient will be WBAT in a post op shoe to the operative limb until further instructed. The dressing is to remain clean, dry,  and intact. Will continue to follow unless noted elsewhere.   [name], DPM [facility]

## 2023-11-06 NOTE — Plan of Care (Signed)
  Problem: Coping: Goal: Ability to adjust to condition or change in health will improve Outcome: Progressing   Problem: Fluid Volume: Goal: Ability to maintain a balanced intake and output will improve Outcome: Progressing   Problem: Skin Integrity: Goal: Risk for impaired skin integrity will decrease Outcome: Progressing   Problem: Nutrition: Goal: Adequate nutrition will be maintained Outcome: Progressing   Problem: Pain Managment: Goal: General experience of comfort will improve and/or be controlled Outcome: Progressing

## 2023-11-06 NOTE — Progress Notes (Signed)
History and Physical Interval Note:  11/06/2023 9:06 AM  Austin [name]  has presented today for surgery, with the diagnosis of gangrene of 4th toe of right foot.  The various methods of treatment have been discussed with the patient and family. After consideration of risks, benefits and other options for treatment, the patient has consented to   Procedure(s): Right 4th Toe Amputation (Right) as a surgical intervention.  [name] history has been reviewed, patient examined, no change in status, stable for surgery.  I have reviewed the patient's chart and labs.  Questions were answered to the patient's satisfaction.     Jenelle Mages [name]

## 2023-11-06 NOTE — Anesthesia Postprocedure Evaluation (Signed)
Anesthesia Post Note  Patient: Austin [name]  Procedure(s) Performed: Right 4th Toe Amputation (Right: Toe)  Patient location during evaluation: PACU Anesthesia Type: General Level of consciousness: awake and alert Pain management: pain level controlled Vital Signs Assessment: post-procedure vital signs reviewed and stable Respiratory status: spontaneous breathing, nonlabored ventilation, respiratory function stable and patient connected to nasal cannula oxygen Cardiovascular status: blood pressure returned to baseline and stable Postop Assessment: no apparent nausea or vomiting Anesthetic complications: no   No notable events documented.   Last Vitals:  Vitals:   11/06/23 1040 11/06/23 1055  BP:  99/65  Pulse: 87 93  Resp: (!) 26 16  Temp:  36.8 C  SpO2: 97% 95%    Last Pain:  Vitals:   11/06/23 1055  TempSrc: Oral  PainSc: 0-No pain                 Cleda Mccreedy Vali Capano

## 2023-11-07 ENCOUNTER — Encounter: Payer: Self-pay | Admitting: Podiatry

## 2023-11-07 DIAGNOSIS — I96 Gangrene, not elsewhere classified: Secondary | ICD-10-CM | POA: Diagnosis not present

## 2023-11-07 LAB — GLUCOSE, CAPILLARY
Glucose-Capillary: 165 mg/dL — ABNORMAL HIGH (ref 70–99)
Glucose-Capillary: 141 mg/dL — ABNORMAL HIGH (ref 70–99)
Glucose-Capillary: 154 mg/dL — ABNORMAL HIGH (ref 70–99)
Glucose-Capillary: 119 mg/dL — ABNORMAL HIGH (ref 70–99)

## 2023-11-07 LAB — CBC
HCT: 40.5 % (ref 39.0–52.0)
Hemoglobin: 13.7 g/dL (ref 13.0–17.0)
MCH: 27.9 pg (ref 26.0–34.0)
MCHC: 33.8 g/dL (ref 30.0–36.0)
MCV: 82.5 fL (ref 80.0–100.0)
Platelets: 166 10*3/uL (ref 150–400)
RBC: 4.91 MIL/uL (ref 4.22–5.81)
RDW: 14.3 % (ref 11.5–15.5)
WBC: 6.3 10*3/uL (ref 4.0–10.5)
nRBC: 0 % (ref 0.0–0.2)

## 2023-11-07 LAB — BASIC METABOLIC PANEL
Anion gap: 9 (ref 5–15)
BUN: 17 mg/dL (ref 8–23)
CO2: 26 mmol/L (ref 22–32)
Calcium: 8.7 mg/dL — ABNORMAL LOW (ref 8.9–10.3)
Chloride: 105 mmol/L (ref 98–111)
Creatinine, Ser: 0.8 mg/dL (ref 0.61–1.24)
GFR, Estimated: >60 mL/min (ref >60)
Glucose, Bld: 175 mg/dL — ABNORMAL HIGH (ref 70–99)
Potassium: 4.4 mmol/L (ref 3.5–5.1)
Sodium: 140 mmol/L (ref 135–145)

## 2023-11-07 LAB — MAGNESIUM: Magnesium: 2 mg/dL (ref 1.7–2.4)

## 2023-11-07 MED ORDER — AMOXICILLIN-POT CLAVULANATE 875-125 MG PO TABS
1.0000 | ORAL_TABLET | Freq: Two times a day (BID) | ORAL | Status: DC
  Administered 2023-11-07 – 2023-11-10 (×7): 1 via ORAL
  Filled 2023-11-07 (×7): qty 1

## 2023-11-07 MED ORDER — APIXABAN 5 MG PO TABS
5.0000 mg | ORAL_TABLET | Freq: Two times a day (BID) | ORAL | Status: DC
  Administered 2023-11-07 – 2023-11-10 (×6): 5 mg via ORAL
  Filled 2023-11-07 (×6): qty 1

## 2023-11-07 NOTE — Progress Notes (Signed)
  Subjective:  Patient ID: Austin [name], male    DOB: 25-Apr-1956,  MRN: [id number]  Chief Complaint  Patient presents with   Toe Injury    DOS: 11/06/2023 Procedure: Right fourth toe amputation at MPJ level  68 y.o. male seen for post op check.  Patient doing well.  He denies any pain in the right foot.  He has postop shoe in room.  Review of Systems: Negative except as noted in the HPI. Denies N/V/F/Ch.   Objective:   Vitals:   11/07/23 0404 11/07/23 0812  BP: 127/66 115/82  Pulse: 79 78  Resp: 20 19  Temp: 99 F (37.2 C) 98 F (36.7 C)  SpO2: 99% 98%   Body mass index is 28.89 kg/m. Constitutional Well developed. Well nourished.  Vascular Foot warm and well perfused. Capillary refill normal to all digits.   No calf pain with palpation  Neurologic Normal speech. Oriented to person, place, and time. Epicritic sensation absent to right forefoot  Dermatologic Dressing clean dry and intact right foot  Orthopedic: Status post right fourth toe amputation MPJ level   Radiographs: Expected postop changes with disarticulation from the toe at MPJ level  Pathology: Pending  Micro: Deferred  Assessment:   1. Gangrene (HCC)   Gangrene of right fourth toe status post amputation postop day 1  Plan:  Patient was evaluated and treated and all questions answered.  POD # 1 s/p right fourth toe amputation level -Progressing as expected postoperatively.  Minimal bleeding was noted intraoperatively places the patient at high risk for nonhealing of the amputation site and need for revision surgery.  He is aware of this. -XR: Expected postop changes -WB Status: Weightbearing as tolerated in postop shoe -Sutures: To remain intact 2 to 3 weeks. -Medications/ABX: Recommend 7 days of Augmentin post op for prophylaxis -Dressing to right foot to remain clean dry and intact until follow-up in 7 days, will reach out to schedulers to have them scheduled for [place] location next  week - Will sign off at this time, please msg with any concerns.        Corinna Gab, DPM [facility] / [facility]

## 2023-11-07 NOTE — Evaluation (Signed)
Physical Therapy Evaluation Patient Details Name: Austin [name] MRN: [id number] DOB: 1956-08-30 Today's Date: 11/07/2023  History of Present Illness  Austin [name] is a 68 y.o. male with medical history significant of type 2 diabetes, coronary disease, history of DVT, peripheral vascular disease, essential hypertension, dyslipidemia, who present to the hospital with right fifth toe gangrene. Patient has right leg swelling, right foot pain for a long while. Condition getting worse now. When he came to the hospital, the fifth toe looks necrotic. ED has spoken with podiatry, need amputation as well as vascular intervention.   Clinical Impression  Pt admitted with above diagnosis. Pt currently with functional limitations due to the deficits listed below (see PT Problem List). Pt received supine in bed agreeable to PT. Reports PTA pt living alone primarily using manual W/c for mobility via lateral scoot. Ambulates short household distances on occasion with RW and L prosthetic for BKA.   To date pt is independent with supine to sit. Able to done post op shoe on RLE with set up assist. Pt reliant on bed greatly elevated to stand needing minA to RW. Needing assist for donning L BKA liner but dons prosthetic in sitting independently. Pt has no socks for prosthetic thus poor fit of prosthesis. Educated pt to have family bring in socks for future mobility and importance of them for safe use of prosthesis. Significant anterior trunk lean needed to stand and VC's for RW use for safe step pivot being WBAT on R post op shoe. Pt left in recliner with all needs in reach. Reviewed knee mobility to prevent flexor contracture. Pt has deficits in poor safety awareness, unclear of his current deficits and significant muscle weakness limiting safe transfers and gait. Pt would benefit from d/c recs to address these deficits prior to return home.      If plan is discharge home, recommend the following: A lot of help with  walking and/or transfers;A lot of help with bathing/dressing/bathroom;Help with stairs or ramp for entrance;Assistance with cooking/housework;Assist for transportation   Can travel by private vehicle   No    Equipment Recommendations Other (comment) (TBD by next venue of care)  Recommendations for Other Services       Functional Status Assessment Patient has had a recent decline in their functional status and demonstrates the ability to make significant improvements in function in a reasonable and predictable amount of time.     Precautions / Restrictions Precautions Precautions: Fall Precaution Comments: Has L prosthetic for BKA Restrictions Weight Bearing Restrictions Per Provider Order: Yes RLE Weight Bearing Per Provider Order: Weight bearing as tolerated (in post-op shoe)      Mobility  Bed Mobility Overal bed mobility: Independent               Patient Response: Cooperative  Transfers Overall transfer level: Needs assistance Equipment used: Rolling walker (2 wheels) Transfers: Sit to/from Stand, Bed to chair/wheelchair/BSC Sit to Stand: Min assist, From elevated surface   Step pivot transfers: Contact guard assist       General transfer comment: VC's for RW positioning    Ambulation/Gait               General Gait Details: deferred due to falls risk with transfer  Stairs            Wheelchair Mobility     Tilt Bed Tilt Bed Patient Response: Cooperative  Modified Rankin (Stroke Patients Only)       Balance Overall balance assessment:  Needs assistance   Sitting balance-Leahy Scale: Normal     Standing balance support: Bilateral upper extremity supported, Reliant on assistive device for balance Standing balance-Leahy Scale: Poor Standing balance comment: need for heavy BUE support to stand                             Pertinent Vitals/Pain Pain Assessment Pain Assessment: Faces Faces Pain Scale: No hurt    Home  Living Family/patient expects to be discharged to:: Private residence Living Arrangements: Alone Available Help at Discharge: Family;Available PRN/intermittently Type of Home: Mobile home Home Access: Ramped entrance       Home Layout: One level Home Equipment: Pharmacist, hospital (2 wheels);Wheelchair - manual Additional Comments: L prosthesis    Prior Function Prior Level of Function : Independent/Modified Independent             Mobility Comments: Mainly lateral scoot transfers to <> from w/c.  Ambulates with RW short household distances on occasion. Was receiving OPPT working on standing, transfers, and gait PTA.       Extremity/Trunk Assessment   Upper Extremity Assessment Upper Extremity Assessment: Defer to OT evaluation    Lower Extremity Assessment Lower Extremity Assessment: Generalized weakness;RLE deficits/detail;LLE deficits/detail RLE Deficits / Details: R toe amputation LLE Deficits / Details: BKA    Cervical / Trunk Assessment Cervical / Trunk Assessment: Normal  Communication   Communication Communication: No apparent difficulties Cueing Techniques: Verbal cues  Cognition Arousal: Alert Behavior During Therapy: WFL for tasks assessed/performed Overall Cognitive Status: Within Functional Limits for tasks assessed                                 General Comments: Pleasant and cooperative.        General Comments      Exercises Other Exercises Other Exercises: Importance of abiding by WB status and post op shoe use. Discussion on need for pt's socks for L BKA for appropraite fit for prosthetic. Reviewed importance of L knee mobility/flexibility to prevent contracture.   Assessment/Plan    PT Assessment Patient needs continued PT services  PT Problem List Decreased strength;Decreased range of motion;Decreased activity tolerance;Decreased balance;Decreased mobility       PT Treatment Interventions DME  instruction;Neuromuscular re-education;Gait training;Stair training;Patient/family education;Functional mobility training;Therapeutic activities;Therapeutic exercise;Balance training    PT Goals (Current goals can be found in the Care Plan section)  Acute Rehab PT Goals Patient Stated Goal: improve mobility PT Goal Formulation: With patient Time For Goal Achievement: 11/21/23 Potential to Achieve Goals: Good    Frequency Min 1X/week     Co-evaluation               AM-PAC PT "6 Clicks" Mobility  Outcome Measure Help needed turning from your back to your side while in a flat bed without using bedrails?: None Help needed moving from lying on your back to sitting on the side of a flat bed without using bedrails?: None Help needed moving to and from a bed to a chair (including a wheelchair)?: A Little Help needed standing up from a chair using your arms (e.g., wheelchair or bedside chair)?: A Lot Help needed to walk in hospital room?: Total Help needed climbing 3-5 steps with a railing? : Total 6 Click Score: 15    End of Session Equipment Utilized During Treatment: Gait belt Activity Tolerance: Patient tolerated treatment well Patient  left: in chair;with chair alarm set Nurse Communication: Mobility status PT Visit Diagnosis: Other abnormalities of gait and mobility (R26.89);Muscle weakness (generalized) (M62.81);Difficulty in walking, not elsewhere classified (R26.2)    Time: 1356-1430 PT Time Calculation (min) (ACUTE ONLY): 34 min   Charges:   PT Evaluation $PT Eval Moderate Complexity: 1 Mod PT Treatments $Therapeutic Activity: 8-22 mins PT General Charges $$ ACUTE PT VISIT: 1 Visit         Delphia Grates. Fairly IV, PT, DPT Physical Therapist- [place]  [facility]  11/07/2023, 3:48 PM

## 2023-11-07 NOTE — Progress Notes (Addendum)
  PROGRESS NOTE    Austin [name]  UJW:[id number] DOB: 12-31-1955 DOA: 11/03/2023 PCP: Preston Fleeting, MD  211A/211A-AA  LOS: 4 days   Brief hospital course:   Assessment & Plan: Austin [name] is a 68 y.o. male with medical history significant of type 2 diabetes, coronary disease, history of DVT, peripheral vascular disease, essential hypertension, dyslipidemia, who present to the hospital with right 4th toe gangrene.  Podiatry and vascular surgery consulted.    Right foot 4th toe dry gangrene. Peripheral arterial disease. --angiogram with vascular intervention on 1/29 --4th toe amputation on 1/30 Continue aspirin, Plavix. --cont statin --start Augmentin x7 days for ppx, per podiatry   Upper respiratory infection. Patient had cough, nasal congestion for a few days.  Negative COVID.  Symptomatic treatment.    History of DVT. Right leg had some edema, resolved.  Duplex shows no DVT. --resume home Eliquis   Uncontrolled type 2 diabetes with hyperglycemia on long-term insulin use. --cont glargine 40u nightly --mealtime 6u TID --ACHS and SSI   Essential hypertension.   --cont losartan   Overweight BMI 28.89. Diet and excise.   Right knee osteoarthritis Patient is some knee pain, x-ray showed osteoarthritis.  Symptomatic treatment.   DVT prophylaxis: FA:OZHYQMV Code Status: Full code  Family Communication:  Level of care: Med-Surg Dispo:   The patient is from: home Anticipated d/c is to: SNF rehab Anticipated d/c date is: whenever bed available   Subjective and Interval History:  Had wound check by podiatry today.    PT eval found pt unstable on his only RLE.     Objective: Vitals:   11/07/23 0404 11/07/23 0812 11/07/23 1619 11/07/23 1936  BP: 127/66 115/82 (!) 101/90 (!) 133/113  Pulse: 79 78 70 63  Resp: 20 19 19 18   Temp: 99 F (37.2 C) 98 F (36.7 C) 98 F (36.7 C) 97.7 F (36.5 C)  TempSrc: Oral Oral Oral Oral  SpO2: 99% 98% 100% 100%   Weight:      Height:        Intake/Output Summary (Last 24 hours) at 11/07/2023 1938 Last data filed at 11/07/2023 1429 Gross per 24 hour  Intake 240 ml  Output --  Net 240 ml   Filed Weights   11/03/23 1209  Weight: 113.4 kg    Examination:   Constitutional: NAD, AAOx3 HEENT: conjunctivae and lids normal, EOMI CV: No cyanosis.   RESP: normal respiratory effort, on RA Extremities: left BKA, right foot wrapped SKIN: warm, dry Neuro: II - XII grossly intact.   Psych: Normal mood and affect.  Appropriate judgement and reason   Data Reviewed: I have personally reviewed labs and imaging studies  Time spent: 35 minutes  Darlin Priestly, MD [facility] If 7PM-7AM, please contact night-coverage 11/07/2023, 7:38 PM

## 2023-11-07 NOTE — Care Management Important Message (Signed)
Important Message  Patient Details  Name: Austin [name] MRN: [id number] Date of Birth: [birth year]   Important Message Given:  Yes - Medicare IM     Cristela Blue, CMA 11/07/2023, 9:39 AM

## 2023-11-08 DIAGNOSIS — I96 Gangrene, not elsewhere classified: Secondary | ICD-10-CM | POA: Diagnosis not present

## 2023-11-08 LAB — GLUCOSE, CAPILLARY
Glucose-Capillary: 151 mg/dL — ABNORMAL HIGH (ref 70–99)
Glucose-Capillary: 169 mg/dL — ABNORMAL HIGH (ref 70–99)
Glucose-Capillary: 142 mg/dL — ABNORMAL HIGH (ref 70–99)
Glucose-Capillary: 273 mg/dL — ABNORMAL HIGH (ref 70–99)
Glucose-Capillary: 309 mg/dL — ABNORMAL HIGH (ref 70–99)

## 2023-11-08 NOTE — TOC Initial Note (Addendum)
Transition of Care [facility]) - Initial/Assessment Note    Patient Details  Name: Austin [name] MRN: [id number] Date of Birth: July 07, 1956  Transition of Care [facility]) CM/SW Contact:    Liliana Cline, LCSW Phone Number: 11/08/2023, 3:15 PM  Clinical Narrative:                 CSW spoke to patient regarding SNF recommendations from therapy. Patient lives alone and uses Medicaid transportation services. PCP and Pharmacy is [facility]. Patient has a walker, wheelchair, and shower chair at home. Patient is aware of SNF recommendation and went to [facility] in the past. Patient states he would prefer to return home and go to [facility] rather than SNF or Home Health. Patient states he already called [facility] to start this process and will call them to set up an appointment. He has been there before and plans to use Medicaid transportation to and from [facility]. He feels safe returning home instead of SNF.  Expected Discharge Plan: OP Rehab Barriers to Discharge: Continued Medical Work up   Patient Goals and CMS Choice Patient states their goals for this hospitalization and ongoing recovery are:: declines SNF, wants to go to [employer].gov Compare Post Acute Care list provided to:: Patient Choice offered to / list presented to : Patient      Expected Discharge Plan and Services       Living arrangements for the past 2 months: Single Family Home                                      Prior Living Arrangements/Services Living arrangements for the past 2 months: Single Family Home Lives with:: Self Patient language and need for interpreter reviewed:: Yes Do you feel safe going back to the place where you live?: Yes      Need for Family Participation in Patient Care: Yes (Comment) Care giver support system in place?: Yes (comment) Current home services: DME Criminal Activity/Legal Involvement Pertinent to Current Situation/Hospitalization: No -  Comment as needed  Activities of Daily Living   ADL Screening (condition at time of admission) Independently performs ADLs?: Yes (appropriate for developmental age) Is the patient deaf or have difficulty hearing?: No Does the patient have difficulty seeing, even when wearing glasses/contacts?: No Does the patient have difficulty concentrating, remembering, or making decisions?: No  Permission Sought/Granted Permission sought to share information with : Facility Industrial/product designer granted to share information with : Yes, Verbal Permission Granted     Permission granted to share info w AGENCY: as needed        Emotional Assessment       Orientation: : Oriented to Self, Oriented to Place, Oriented to  Time, Oriented to Situation Alcohol / Substance Use: Not Applicable Psych Involvement: No (comment)  Admission diagnosis:  Gangrene (HCC) [I96] Toe gangrene (HCC) [I96] Patient Active Problem List   Diagnosis Date Noted   Toe gangrene (HCC) 11/03/2023   Overweight (BMI 25.0-29.9) 11/03/2023   Right leg pain 06/26/2023   Obesity (BMI 30-39.9) 06/25/2023   DVT (deep venous thrombosis) (HCC)    Critical limb ischemia of right lower extremity (HCC) 04/23/2022   Medication monitoring encounter 03/29/2022   Diabetic foot infection (HCC) 03/29/2022   Acute blood loss anemia 03/21/2022   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) 03/08/2022   Altered mental status  Osteomyelitis of fifth toe of right foot (HCC)    Diabetic foot ulcer associated with type 2 diabetes mellitus (HCC) 02/16/2022   Atrial fibrillation, chronic (HCC) 02/16/2022   Infection of left hand 09/06/2021   Pressure injury of coccygeal region, stage 1 09/06/2021   Coronary artery disease    Peripheral vascular disease (HCC)    Lower limb ulcer, calf (HCC) 07/13/2021   Acute osteomyelitis of right foot (HCC) 03/30/2021   Diabetes mellitus type 2, uncomplicated (HCC)  09/16/2019   Atherosclerosis of native arteries of the extremities with ulceration (HCC) 01/01/2019   Status post below-knee amputation (HCC) - left side 11/30/2018   Type 2 diabetes mellitus with diabetic peripheral angiopathy without gangrene (HCC) 10/14/2018   Malnutrition of moderate degree 10/02/2018   Foot ulcer (HCC) 09/29/2018   Atherosclerosis of native arteries of extremity with intermittent claudication (HCC) 05/25/2017   Essential hypertension 05/25/2017   Hyperlipidemia 05/25/2017   PCP:  Preston Fleeting, MD Pharmacy:   [facility] [address], [place] - [address] [place] [address] Phone: [phone] Fax: [phone]  [name] [address] [place] [address] Phone: [phone] Fax: [phone]  [id number] [place], [place] - [address] [place] [address] Phone: [phone] Fax: [phone]  [facility] - [place], [place] - [address] [place] [address] Phone: [phone] Fax: [phone]  [facility] [place], [place] - [address] [place] [address] Phone: [phone] Fax: [phone]     Social Drivers of Health (SDOH) Social History: SDOH Screenings   Food Insecurity: No Food Insecurity (11/04/2023)  Housing: Low Risk  (11/04/2023)  Transportation Needs: No Transportation Needs (11/04/2023)  Utilities: Not At Risk (11/04/2023)  Depression (PHQ2-9): Low Risk  (06/25/2022)  Social Connections: Moderately Isolated (11/04/2023)  Tobacco Use: Medium Risk (11/06/2023)   SDOH Interventions:     Readmission Risk Interventions    11/08/2023    3:14 PM  Readmission Risk Prevention Plan  Transportation Screening Complete  PCP or Specialist Appt within 5-7 Days Complete  Home Care Screening Complete  Medication  Review (RN CM) Complete

## 2023-11-08 NOTE — Evaluation (Signed)
Occupational Therapy Evaluation Patient Details Name: Austin [name] MRN: [id number] DOB: 1955/11/23 Today's Date: 11/08/2023   History of Present Illness Pt is a 68 year old male s/p R fourth toe amputation 11/05/22 after presenting to the hospital with R 4th toe gangrene    PMH significant for 2 diabetes, coronary disease, history of DVT, peripheral vascular disease, essential hypertension, dyslipidemia   Clinical Impression   Chart reviewed, pt greeted in bed, alert and oriented x4, slow processing, agreeable to OT evaluation. PTA pt reports he performs dressing, grooming, feeding with MOD I, assist from his sister for bathing while seated on a shower chair PRN. Sister assists with IADLs such as grocery shopping, cooking, driving. Pt presents with deficits in strength, endurance, activity tolerance, balance, affecting safe and optimal ADL completion. Pt performed bed mobility with MOD I, attempts at STS with RW (poor fitting prosthetic on this date) with MOD-MAX A, lateral scoot to bedside chair performed with MIN A and intermittent vcs for technique. UB bathing/dressing completed with SET UP, LB bathing for peri area with MAX A, SET UP for the LLE and upper RLE. Pt will benefit from acute OT to address deficits and to facilitate optimal ADL performance. Pt is left in bedside chair, all needs met. OT will follow.         If plan is discharge home, recommend the following: A lot of help with walking and/or transfers;A lot of help with bathing/dressing/bathroom;Assistance with cooking/housework;Help with stairs or ramp for entrance    Functional Status Assessment  Patient has had a recent decline in their functional status and demonstrates the ability to make significant improvements in function in a reasonable and predictable amount of time.  Equipment Recommendations  Other (comment) (defer to next venue of care)    Recommendations for Other Services       Precautions / Restrictions  Precautions Precautions: Fall Precaution Comments: Has L prosthetic for BKA- poor fit however Restrictions Weight Bearing Restrictions Per Provider Order: Yes RLE Weight Bearing Per Provider Order: Weight bearing as tolerated (in post op shoe per podiatry note)      Mobility Bed Mobility Overal bed mobility: Modified Independent                  Transfers                          Balance Overall balance assessment: Needs assistance Sitting-balance support: Single extremity supported Sitting balance-Leahy Scale: Fair Sitting balance - Comments: poor dynamic sitting balance when attempting to donn prosthetic   Standing balance support: Bilateral upper extremity supported, Reliant on assistive device for balance Standing balance-Leahy Scale: Poor                             ADL either performed or assessed with clinical judgement   ADL Overall ADL's : Needs assistance/impaired Eating/Feeding: Set up;Sitting   Grooming: Wash/dry face;Sitting;Set up   Upper Body Bathing: Minimal assistance;Sitting Upper Body Bathing Details (indicate cue type and reason): in chair   Lower Body Bathing Details (indicate cue type and reason): MAX assist for peri care with brief STS, pt washes LLE, upper part of RLE with MIN A Upper Body Dressing : Set up;Sitting Upper Body Dressing Details (indicate cue type and reason): doff/donn gown Lower Body Dressing: Maximal assistance;Sitting/lateral leans Lower Body Dressing Details (indicate cue type and reason): donn/doff post op shoe; pt with  poor sitting balance on edge of bed with attempts to donn LLE prosthetic Toilet Transfer: Minimal assistance Toilet Transfer Details (indicate cue type and reason): lateral scoot to bedside chair; attempted STS with RW and pt requires MOD-MAX A at this time Toileting- Clothing Manipulation and Hygiene: Maximal assistance;Sitting/lateral lean;Sit to/from stand                Vision Patient Visual Report: No change from baseline       Perception         Praxis         Pertinent Vitals/Pain Pain Assessment Pain Assessment: No/denies pain     Extremity/Trunk Assessment Upper Extremity Assessment Upper Extremity Assessment: Generalized weakness   Lower Extremity Assessment Lower Extremity Assessment: Generalized weakness RLE Deficits / Details: old L BKA, s/p R toe amputation       Communication Communication Communication: Difficulty following commands/understanding Following commands: Follows one step commands with increased time Cueing Techniques: Verbal cues   Cognition Arousal: Alert Behavior During Therapy: Flat affect, WFL for tasks assessed/performed Overall Cognitive Status: No family/caregiver present to determine baseline cognitive functioning Area of Impairment: Awareness, Problem solving, Following commands                       Following Commands: Follows one step commands with increased time   Awareness: Emergent Problem Solving: Slow processing, Requires verbal cues       General Comments  vss throughout    Exercises Other Exercises Other Exercises: edu re: role of OT, role of rehab, wbing status, discharge recommendation   Shoulder Instructions      Home Living Family/patient expects to be discharged to:: Private residence Living Arrangements: Alone Available Help at Discharge: Family;Available PRN/intermittently (sister checks in on him) Type of Home: Mobile home Home Access: Ramped entrance     Home Layout: One level     Bathroom Shower/Tub: Estate manager/land agent Accessibility: Yes   Home Equipment: Pharmacist, hospital (2 wheels);Wheelchair - Careers adviser (comment) (prosthetic)          Prior Functioning/Environment Prior Level of Function : Needs assist       Physical Assist : ADLs (physical)   ADLs (physical): IADLs Mobility Comments: at this time, pt primarily  performs lateral scoots to mwc, amb short household distances with RW intermittently- however prosthetic appears with a poor fit at this time ADLs Comments: MOD I in dressing, grooming, feeding, toileting; sister is present to assist in bathing on seat; sister takes him grocery shopping/runs errands        OT Problem List: Decreased strength;Decreased activity tolerance;Decreased knowledge of use of DME or AE;Impaired balance (sitting and/or standing);Decreased safety awareness      OT Treatment/Interventions: Self-care/ADL training;DME and/or AE instruction;Therapeutic activities;Balance training;Therapeutic exercise;Energy conservation;Patient/family education    OT Goals(Current goals can be found in the care plan section) Acute Rehab OT Goals Patient Stated Goal: return to PLOF OT Goal Formulation: With patient Time For Goal Achievement: 11/22/23 Potential to Achieve Goals: Good ADL Goals Pt Will Perform Grooming: with modified independence;sitting Pt Will Perform Lower Body Dressing: with modified independence;sitting/lateral leans;sit to/from stand Pt Will Transfer to Toilet: with modified independence;squat pivot transfer Pt Will Perform Toileting - Clothing Manipulation and hygiene: with modified independence;sitting/lateral leans;sit to/from stand Pt/caregiver will Perform Home Exercise Program: Increased strength;Both right and left upper extremity;With written HEP provided  OT Frequency: Min 1X/week    Co-evaluation  AM-PAC OT "6 Clicks" Daily Activity     Outcome Measure Help from another person eating meals?: None Help from another person taking care of personal grooming?: None Help from another person toileting, which includes using toliet, bedpan, or urinal?: A Little Help from another person bathing (including washing, rinsing, drying)?: A Little Help from another person to put on and taking off regular upper body clothing?: None Help from another  person to put on and taking off regular lower body clothing?: A Lot 6 Click Score: 20   End of Session Equipment Utilized During Treatment: Rolling walker (2 wheels);Other (comment) (prosthetic) Nurse Communication: Mobility status  Activity Tolerance: Patient tolerated treatment well Patient left: in chair;with call bell/phone within reach;with chair alarm set  OT Visit Diagnosis: Other abnormalities of gait and mobility (R26.89);Muscle weakness (generalized) (M62.81);Unsteadiness on feet (R26.81)                Time: 8295-6213 OT Time Calculation (min): 29 min Charges:  OT General Charges $OT Visit: 1 Visit OT Evaluation $OT Eval Moderate Complexity: 1 Mod  Oleta Mouse, OTD OTR/L  11/08/23, 10:40 AM

## 2023-11-08 NOTE — Progress Notes (Signed)
  PROGRESS NOTE    BELL CAI  ZOX:[id number] DOB: 12-04-1955 DOA: 11/03/2023 PCP: Preston Fleeting, MD  211A/211A-AA  LOS: 5 days   Brief hospital course:   Assessment & Plan: Austin [name] is a 68 y.o. male with medical history significant of type 2 diabetes, coronary disease, history of DVT, peripheral vascular disease, essential hypertension, dyslipidemia, who present to the hospital with right 4th toe gangrene.  Podiatry and vascular surgery consulted.    Right foot 4th toe dry gangrene. Peripheral arterial disease. --angiogram with vascular intervention on 1/29 --4th toe amputation on 1/30 Continue aspirin, Plavix. --cont statin --cont Augmentin for 7 days, for ppx per podiatry   Upper respiratory infection. Patient had cough, nasal congestion for a few days.  Negative COVID.  Symptomatic treatment.    History of DVT. Right leg had some edema, resolved.  Duplex shows no DVT. --cont home Eliquis   Uncontrolled type 2 diabetes with hyperglycemia on long-term insulin use. --cont glargine 40u nightly --mealtime 6u TID --ACHS and SSI   Essential hypertension.   --cont losartan   Overweight BMI 28.89. Diet and excise.   Right knee osteoarthritis Patient is some knee pain, x-ray showed osteoarthritis.  Symptomatic treatment.   DVT prophylaxis: WJ:[id number] Code Status: Full code  Family Communication:  Level of care: Med-Surg Dispo:   The patient is from: home Anticipated d/c is to: SNF rehab Anticipated d/c date is: whenever bed available   Subjective and Interval History:  No new event today.    Objective: Vitals:   11/07/23 1936 11/07/23 2010 11/08/23 0739 11/08/23 1459  BP: (!) 133/113 131/80 138/64 124/60  Pulse: 63 93 78 (!) 58  Resp: 18 19 14 16   Temp: 97.7 F (36.5 C) (!) 97.5 F (36.4 C) 97.9 F (36.6 C) 98.2 F (36.8 C)  TempSrc: Oral Oral Oral Oral  SpO2: 100% 100% 98% 100%  Weight:      Height:        Intake/Output  Summary (Last 24 hours) at 11/08/2023 2000 Last data filed at 11/08/2023 0900 Gross per 24 hour  Intake 240 ml  Output --  Net 240 ml   Filed Weights   11/03/23 1209  Weight: 113.4 kg    Examination:   Constitutional: NAD, AAOx3 HEENT: conjunctivae and lids normal, EOMI CV: No cyanosis.   RESP: normal respiratory effort, on RA Extremities: left BKA, right foot in ACE wrap SKIN: warm, dry Neuro: II - XII grossly intact.   Psych: Normal mood and affect.     Data Reviewed: I have personally reviewed labs and imaging studies  Time spent: 35 minutes  Darlin Priestly, MD [facility] If 7PM-7AM, please contact night-coverage 11/08/2023, 8:00 PM

## 2023-11-08 NOTE — Plan of Care (Signed)
   Problem: Metabolic: Goal: Ability to maintain appropriate glucose levels will improve Outcome: Progressing

## 2023-11-08 NOTE — Plan of Care (Signed)

## 2023-11-09 DIAGNOSIS — I96 Gangrene, not elsewhere classified: Secondary | ICD-10-CM | POA: Diagnosis not present

## 2023-11-09 LAB — GLUCOSE, CAPILLARY
Glucose-Capillary: 131 mg/dL — ABNORMAL HIGH (ref 70–99)
Glucose-Capillary: 188 mg/dL — ABNORMAL HIGH (ref 70–99)
Glucose-Capillary: 132 mg/dL — ABNORMAL HIGH (ref 70–99)
Glucose-Capillary: 246 mg/dL — ABNORMAL HIGH (ref 70–99)

## 2023-11-09 NOTE — Progress Notes (Signed)
  PROGRESS NOTE    NICHOLA WARREN  ZOX:[id number] DOB: 1956/03/30 DOA: 11/03/2023 PCP: Preston Fleeting, MD  211A/211A-AA  LOS: 6 days   Brief hospital course:   Assessment & Plan: Austin [name] is a 68 y.o. male with medical history significant of type 2 diabetes, coronary disease, history of DVT, peripheral vascular disease, essential hypertension, dyslipidemia, who present to the hospital with right 4th toe gangrene.  Podiatry and vascular surgery consulted.    Right foot 4th toe dry gangrene. Peripheral arterial disease. --angiogram with vascular intervention on 1/29 --4th toe amputation on 1/30 Continue aspirin, Plavix. --cont statin --cont Augmentin for 7 days, for ppx per podiatry   Upper respiratory infection. Patient had cough, nasal congestion for a few days.  Negative COVID.  Symptomatic treatment.    History of DVT. Right leg had some edema, resolved.  Duplex shows no DVT. --cont home Eliquis   Uncontrolled type 2 diabetes with hyperglycemia on long-term insulin use. --cont glargine 40u nightly --mealtime 6u TID --ACHS and SSI   Essential hypertension.   --cont losartan   Overweight BMI 28.89. Diet and excise.   Right knee osteoarthritis Patient is some knee pain, x-ray showed osteoarthritis.  Symptomatic treatment.   DVT prophylaxis: WJ:[id number] Code Status: Full code  Family Communication:  Level of care: Med-Surg Dispo:   The patient is from: home Anticipated d/c is to: SNF rehab Anticipated d/c date is: whenever bed available   Subjective and Interval History:  No change or new complaint today.   Objective: Vitals:   11/08/23 2001 11/09/23 0322 11/09/23 0732 11/09/23 1630  BP: 112/67 137/67 108/75 (!) 112/59  Pulse: 69 60 75 (!) 56  Resp: 18 18 16 16   Temp: 97.8 F (36.6 C) 97.6 F (36.4 C) 97.8 F (36.6 C) (!) 97.3 F (36.3 C)  TempSrc: Oral Oral Oral   SpO2: 100% 99% 98% 100%  Weight:      Height:       No intake or  output data in the 24 hours ending 11/09/23 1834  Filed Weights   11/03/23 1209  Weight: 113.4 kg    Examination:   Constitutional: NAD, AAOx3 HEENT: conjunctivae and lids normal, EOMI CV: No cyanosis.   RESP: normal respiratory effort, on RA Extremities: left BKA, right foot in wrap SKIN: warm, dry Neuro: II - XII grossly intact.   Psych: Normal mood and affect.  Appropriate judgement and reason   Data Reviewed: I have personally reviewed labs and imaging studies  Time spent: 25 minutes  Darlin Priestly, MD [facility] If 7PM-7AM, please contact night-coverage 11/09/2023, 6:34 PM

## 2023-11-10 ENCOUNTER — Other Ambulatory Visit: Payer: Self-pay

## 2023-11-10 DIAGNOSIS — I96 Gangrene, not elsewhere classified: Secondary | ICD-10-CM | POA: Diagnosis not present

## 2023-11-10 LAB — GLUCOSE, CAPILLARY
Glucose-Capillary: 120 mg/dL — ABNORMAL HIGH (ref 70–99)
Glucose-Capillary: 151 mg/dL — ABNORMAL HIGH (ref 70–99)

## 2023-11-10 MED ORDER — ORAL CARE MOUTH RINSE: 15.0000 mL | OROMUCOSAL | Status: DC | PRN

## 2023-11-10 MED ORDER — AMOXICILLIN-POT CLAVULANATE 875-125 MG PO TABS
1.0000 | ORAL_TABLET | Freq: Two times a day (BID) | ORAL | 0 refills | Status: AC
  Filled 2023-11-10: qty 8, 4d supply, fill #0

## 2023-11-10 NOTE — Progress Notes (Signed)
IV removed. Discharge instructions reviewed. Follow up appointments reviewed. Patient wheeled to the medical mall exit by volunteers to be discharged to the care of his sister in stable condition.  Austin [name]

## 2023-11-10 NOTE — Plan of Care (Signed)

## 2023-11-10 NOTE — Progress Notes (Signed)
  Subjective:  Patient ID: Austin [name], male    DOB: 10/19/1955,  MRN: [id number]  Chief Complaint  Patient presents with   Toe Injury    DOS: 11/06/2023 Procedure: Right fourth toe amputation at MPJ level  68 y.o. male seen for post op check.  Patient doing well.  He denies any pain in the right foot.  He has been walking a bit with PT. Plan to go to SNF.  Review of Systems: Negative except as noted in the HPI. Denies N/V/F/Ch.   Objective:   Vitals:   11/09/23 1945 11/10/23 0732  BP: (!) 128/56 121/73  Pulse: (!) 54 (!) 57  Resp: 18 18  Temp: 97.9 F (36.6 C) 98 F (36.7 C)  SpO2: 99% 100%   Body mass index is 28.89 kg/m. Constitutional Well developed. Well nourished.  Vascular Foot warm and well perfused. Capillary refill normal to all digits.   No calf pain with palpation  Neurologic Normal speech. Oriented to person, place, and time. Epicritic sensation absent to right forefoot  Dermatologic Amp site viable with no drainage maceration or necrosis   Orthopedic: Status post right fourth toe amputation MPJ level   Radiographs: Expected postop changes with disarticulation from the toe at MPJ level  Pathology: pending  Micro: Deferred  Assessment:   1. Gangrene (HCC)   Gangrene of right fourth toe status post amputation postop day 4  Plan:  Patient was evaluated and treated and all questions answered.  POD # 4 s/p right fourth toe amputation level -Progressing as expected postoperatively. Amp site healing well viable at this time without overt necrosis or dehiscence. -XR: Expected postop changes -WB Status: Weightbearing as tolerated in postop shoe -Sutures: To remain intact 2 to 3 weeks. -Medications/ABX: Finish course of Po abx post op -Dressing changed, leave intact until follow up next week in [place] office - Will sign off at this time, please msg with any concerns.        Corinna Gab, DPM [facility] / [facility]

## 2023-11-10 NOTE — Progress Notes (Signed)
Physical Therapy Treatment Patient Details Name: Austin [name] MRN: [id number] DOB: 04/03/1956 Today's Date: 11/10/2023   History of Present Illness Pt is a 68 year old male s/p R fourth toe amputation 11/05/22 after presenting to the hospital with R 4th toe gangrene    PMH significant for 2 diabetes, coronary disease, history of DVT, peripheral vascular disease, essential hypertension, dyslipidemia    PT Comments  Pt resting in bed upon PT arrival; pt agreeable to therapy.  0/10 pain reported throughout session.  During session pt modified independent semi-supine to sitting EOB; able to donn L LE prosthesis modified independently (extra time to donn liner); min assist to donn R LE post op shoe; and SBA lateral scoot bed to recliner (to R side).  Pt reports feeling good about discharging home and is planning for OP PT.    If plan is discharge home, recommend the following: A little help with walking and/or transfers;A little help with bathing/dressing/bathroom;Assistance with cooking/housework;Assist for transportation;Help with stairs or ramp for entrance   Can travel by private vehicle     Yes  Equipment Recommendations  Other (comment) (pt has manual w/c already)    Recommendations for Other Services       Precautions / Restrictions Precautions Precautions: Fall Precaution Comments: Has L prosthetic for BKA- poor fit however Restrictions Weight Bearing Restrictions Per Provider Order: Yes RLE Weight Bearing Per Provider Order: Weight bearing as tolerated Other Position/Activity Restrictions: WBAT R LE in post op shoe per podiatry note     Mobility  Bed Mobility Overal bed mobility: Modified Independent             General bed mobility comments: Semi-supine to sitting EOB (no difficulties noted)    Transfers Overall transfer level: Needs assistance Equipment used: None Transfers: Bed to chair/wheelchair/BSC            Lateral/Scoot Transfers:  Supervision General transfer comment: lateral scoot to R bed to recliner (armrest of recliner lowered); pt mostly using B UE's and L LE/prosthesis to assist with transfer (minimal weight/use of R LE)    Ambulation/Gait               General Gait Details: Deferred d/t pt reporting being told not to put a lot of weight through R foot   Stairs             Wheelchair Mobility     Tilt Bed    Modified Rankin (Stroke Patients Only)       Balance Overall balance assessment: Needs assistance Sitting-balance support: No upper extremity supported, Feet supported Sitting balance-Leahy Scale: Good Sitting balance - Comments: steady donning L LE prosthesis in sitting on EOB                                    Cognition Arousal: Alert Behavior During Therapy: WFL for tasks assessed/performed Overall Cognitive Status: No family/caregiver present to determine baseline cognitive functioning Area of Impairment: Problem solving, Following commands                       Following Commands: Follows multi-step commands with increased time     Problem Solving: Slow processing General Comments: Pleasant and cooperative.        Exercises General Exercises - Lower Extremity Long Arc Quad: AAROM, Strengthening, Both, 10 reps, Seated Hip Flexion/Marching: AROM, Strengthening, Both, 10 reps, Seated  General Comments  Nursing cleared pt for participation in physical therapy.  Pt agreeable to PT session.  Pt reports no one was able to bring in socks for his L LE prosthesis to improve fit.      Pertinent Vitals/Pain Pain Assessment Pain Assessment: No/denies pain Pain Intervention(s): Limited activity within patient's tolerance, Monitored during session End of session pt's HR 88 bpm and SpO2 sats 98% on room air.    Home Living                          Prior Function            PT Goals (current goals can now be found in the care plan  section) Acute Rehab PT Goals Patient Stated Goal: improve mobility PT Goal Formulation: With patient Time For Goal Achievement: 11/21/23 Potential to Achieve Goals: Good Progress towards PT goals: Progressing toward goals    Frequency    Min 1X/week      PT Plan      Co-evaluation              AM-PAC PT "6 Clicks" Mobility   Outcome Measure  Help needed turning from your back to your side while in a flat bed without using bedrails?: None Help needed moving from lying on your back to sitting on the side of a flat bed without using bedrails?: None Help needed moving to and from a bed to a chair (including a wheelchair)?: A Little Help needed standing up from a chair using your arms (e.g., wheelchair or bedside chair)?: A Lot Help needed to walk in hospital room?: Total Help needed climbing 3-5 steps with a railing? : Total 6 Click Score: 15    End of Session Equipment Utilized During Treatment:  (L LE prosthesis; R post op shoe) Activity Tolerance: Patient tolerated treatment well Patient left: in chair;with call bell/phone within reach;with chair alarm set;Other (comment) (B LE's elevated via pillows) Nurse Communication: Mobility status;Precautions PT Visit Diagnosis: Other abnormalities of gait and mobility (R26.89);Muscle weakness (generalized) (M62.81);Difficulty in walking, not elsewhere classified (R26.2)     Time: 1610-9604 PT Time Calculation (min) (ACUTE ONLY): 18 min  Charges:    $Therapeutic Activity: 8-22 mins PT General Charges $$ ACUTE PT VISIT: 1 Visit                     Hendricks Limes, PT 11/10/23, 9:59 AM

## 2023-11-10 NOTE — Plan of Care (Signed)

## 2023-11-10 NOTE — Discharge Summary (Incomplete)
Physician Discharge Summary   Austin [name]  male DOB: Dec 21, 1955  ZOX:[id number]  PCP: Preston Fleeting, MD  Admit date: 11/03/2023 Discharge date: ***  Admitted From: *** Disposition:  {disposition:18248} Home Health: {yes/no:20286} CODE STATUS: Full code   Hospital Course:  For full details, please see H&P, progress notes, consult notes and ancillary notes.  Briefly,  ***  F/u with podiatry 2/6  4 more days of augmentin  Unless noted above, medications under "STOP" list are ones pt was not taking PTA.  Discharge Diagnoses:  Principal Problem:   Toe gangrene (HCC) Active Problems:   Peripheral vascular disease (HCC)   Essential hypertension   Coronary artery disease   Type 2 diabetes mellitus with diabetic peripheral angiopathy without gangrene (HCC)   Atrial fibrillation, chronic (HCC)   Status post below-knee amputation (HCC) - left side   Diabetes mellitus type 2, uncomplicated (HCC)   Overweight (BMI 25.0-29.9)   30 Day Unplanned Readmission Risk Score    Flowsheet Row ED to Hosp-Admission (Current) from 11/03/2023 in [facility]  30 Day Unplanned Readmission Risk Score (%) 13.72 Filed at 11/10/2023 1201       This score is the patient's risk of an unplanned readmission within 30 days of being discharged (0 -100%). The score is based on dignosis, age, lab data, medications, orders, and past utilization.   Low:  0-14.9   Medium: 15-21.9   High: 22-29.9   Extreme: 30 and above         Discharge Instructions:  Allergies as of 11/10/2023   No Known Allergies      Medication List     STOP taking these medications    acidophilus Caps capsule   aspirin EC 81 MG tablet   diclofenac Sodium 1 % Gel Commonly known as: VOLTAREN   magnesium oxide 400 MG tablet Commonly known as: MAG-OX   methocarbamol 500 MG tablet Commonly known as: ROBAXIN   oxyCODONE-acetaminophen 5-325 MG tablet Commonly known  as: PERCOCET/ROXICET   polyethylene glycol powder 17 GM/SCOOP powder Commonly known as: GLYCOLAX/MIRALAX   senna-docusate 8.6-50 MG tablet Commonly known as: Senokot-S       TAKE these medications    amoxicillin-clavulanate 875-125 MG tablet Commonly known as: AUGMENTIN Take 1 tablet by mouth every 12 (twelve) hours for 4 days.   ascorbic acid 500 MG tablet Commonly known as: VITAMIN C Take 1 tablet (500 mg total) by mouth 2 (two) times daily.   atorvastatin 80 MG tablet Commonly known as: LIPITOR Take 1 tablet (80 mg total) by mouth daily at 6 PM.   clopidogrel 75 MG tablet Commonly known as: PLAVIX Take 1 tablet (75 mg total) by mouth daily at 6 (six) AM.   Eliquis 5 MG Tabs tablet Generic drug: apixaban Take 5 mg by mouth 2 (two) times daily. What changed: Another medication with the same name was removed. Continue taking this medication, and follow the directions you see here.   gabapentin 100 MG capsule Commonly known as: NEURONTIN Take 100 mg by mouth 3 (three) times daily.   insulin lispro 100 UNIT/ML injection Commonly known as: HUMALOG Inject 18 Units into the skin 3 (three) times daily before meals.   Lantus SoloStar 100 UNIT/ML Solostar Pen Generic drug: insulin glargine Inject 41 Units into the skin 2 (two) times daily. What changed:  how much to take when to take this   losartan 25 MG tablet Commonly known as: COZAAR Take 1 tablet (25 mg  total) by mouth daily.   Nicorette Starter Kit 4 MG gum Generic drug: nicotine polacrilex Take 4 mg by mouth as needed for smoking cessation.   Trulicity 0.75 MG/0.5ML Soaj Generic drug: Dulaglutide Inject 1.5 mg into the skin once a week.   Vitamin D3 125 MCG (5000 UT) Caps Take 1 capsule by mouth daily.   Zinc Sulfate 220 (50 Zn) MG Tabs Take 1 tablet (220 mg total) by mouth daily after supper.         Follow-up Information     Revelo, Presley Raddle, MD Follow up in 1 week(s).   Specialty:  Family Medicine Contact information: [address] [place] [address] [phone]                 No Known Allergies   The results of significant diagnostics from this hospitalization (including imaging, microbiology, ancillary and laboratory) are listed below for reference.   Consultations:   Procedures/Studies: DG Foot 2 Views Right Result Date: 11/06/2023 CLINICAL DATA:  Postoperative state. EXAM: RIGHT FOOT - 2 VIEW COMPARISON:  right foot radiographs 11/03/2023 FINDINGS: Redemonstration of amputation of the fifth digit today proximal fifth metatarsal shaft. New amputation of the fourth toe phalanges. Mild lateral great toe metatarsophalangeal degenerative spurring. Mild plantar and posterior calcaneal heel spurs. Mild chronic enthesophyte at the dorsal aspect of the talar neck. IMPRESSION: 1. New amputation of the fourth toe phalanges. 2. Redemonstration of amputation of the fifth digit to the proximal fifth metatarsal shaft. Electronically Signed   By: Neita Garnet M.D.   On: 11/06/2023 13:03   PERIPHERAL VASCULAR CATHETERIZATION Result Date: 11/05/2023 See surgical note for result.  DG Knee 1-2 Views Right Result Date: 11/04/2023 CLINICAL DATA:  Right knee pain. EXAM: RIGHT KNEE - 1-2 VIEW COMPARISON:  None Available. FINDINGS: No evidence of fracture, dislocation, or joint effusion. Minimal to mild narrowing of medial joint space is noted. Soft tissues are unremarkable. IMPRESSION: Minimal to mild degenerative joint disease is noted medially. No acute abnormality seen. Electronically Signed   By: Lupita Raider M.D.   On: 11/04/2023 12:59   US Venous Img Lower Unilateral Right (DVT) Result Date: 11/03/2023 CLINICAL DATA:  Leg edema EXAM: RIGHT LOWER EXTREMITY VENOUS DOPPLER ULTRASOUND TECHNIQUE: Gray-scale sonography with compression, as well as color and duplex ultrasound, were performed to evaluate the deep venous system(s) from the level of the common  femoral vein through the popliteal and proximal calf veins. COMPARISON:  None Available. FINDINGS: VENOUS Normal compressibility of the common femoral, superficial femoral, and popliteal veins. The posterior tibial vein is within normal limits. The peroneal vein is not visualized. Visualized portions of profunda femoral vein and great saphenous vein unremarkable. No filling defects to suggest DVT on grayscale or color Doppler imaging. Doppler waveforms show normal direction of venous flow, normal respiratory plasticity and response to augmentation. Limited views of the contralateral common femoral vein are unremarkable. OTHER None. Limitations: none IMPRESSION: No evidence of right lower extremity DVT. Electronically Signed   By: Darliss Cheney M.D.   On: 11/03/2023 19:41   DG Foot Complete Right Result Date: 11/03/2023 CLINICAL DATA:  Foot and toe pain, infection EXAM: RIGHT FOOT COMPLETE - 3+ VIEW COMPARISON:  04/22/2022 FINDINGS: Previous 5th transmetatarsal amputation. No acute bony abnormality. Specifically, no fracture, subluxation, or dislocation. No bone destruction to suggest osteomyelitis. Mild soft tissue swelling along the dorsum of the foot. No soft tissue gas or radiopaque foreign body. Posterior and plantar calcaneal spurs noted. Scattered  vascular calcifications. IMPRESSION: No acute bony abnormality. Electronically Signed   By: Charlett Nose M.D.   On: 11/03/2023 12:57      Labs: BNP (last 3 results) No results for input(s): "BNP" in the last 8760 hours. Basic Metabolic Panel: Recent Labs  Lab 11/06/23 0557 11/07/23 0503  NA 138 140  K 4.2 4.4  CL 102 105  CO2 25 26  GLUCOSE 152* 175*  BUN 15 17  CREATININE 0.77 0.80  CALCIUM 9.0 8.7*  MG 2.0 2.0   Liver Function Tests: No results for input(s): "AST", "ALT", "ALKPHOS", "BILITOT", "PROT", "ALBUMIN" in the last 168 hours. No results for input(s): "LIPASE", "AMYLASE" in the last 168 hours. No results for input(s): "AMMONIA"  in the last 168 hours. CBC: Recent Labs  Lab 11/06/23 0557 11/07/23 0503  WBC 4.9 6.3  HGB 13.9 13.7  HCT 41.4 40.5  MCV 82.1 82.5  PLT 174 166   Cardiac Enzymes: No results for input(s): "CKTOTAL", "CKMB", "CKMBINDEX", "TROPONINI" in the last 168 hours. BNP: Invalid input(s): "POCBNP" CBG: Recent Labs  Lab 11/09/23 1231 11/09/23 1630 11/09/23 2321 11/10/23 0731 11/10/23 1137  GLUCAP 188* 132* 246* 120* 151*   D-Dimer No results for input(s): "DDIMER" in the last 72 hours. Hgb A1c No results for input(s): "HGBA1C" in the last 72 hours. Lipid Profile No results for input(s): "CHOL", "HDL", "LDLCALC", "TRIG", "CHOLHDL", "LDLDIRECT" in the last 72 hours. Thyroid function studies No results for input(s): "TSH", "T4TOTAL", "T3FREE", "THYROIDAB" in the last 72 hours.  Invalid input(s): "FREET3" Anemia work up No results for input(s): "VITAMINB12", "FOLATE", "FERRITIN", "TIBC", "IRON", "RETICCTPCT" in the last 72 hours. Urinalysis    Component Value Date/Time   COLORURINE YELLOW (A) 03/30/2021 0958   APPEARANCEUR CLEAR (A) 03/30/2021 0958   APPEARANCEUR Cloudy (A) 11/20/2020 1611   LABSPEC 1.027 03/30/2021 0958   LABSPEC 1.024 11/12/2013 2001   PHURINE 5.0 03/30/2021 0958   GLUCOSEU >=500 (A) 03/30/2021 0958   GLUCOSEU >=500 11/12/2013 2001   HGBUR NEGATIVE 03/30/2021 0958   BILIRUBINUR NEGATIVE 03/30/2021 0958   BILIRUBINUR Negative 11/20/2020 1611   BILIRUBINUR Negative 11/12/2013 2001   KETONESUR NEGATIVE 03/30/2021 0958   PROTEINUR NEGATIVE 03/30/2021 0958   NITRITE NEGATIVE 03/30/2021 0958   LEUKOCYTESUR NEGATIVE 03/30/2021 0958   LEUKOCYTESUR Negative 11/12/2013 2001   Sepsis Labs Recent Labs  Lab 11/06/23 0557 11/07/23 0503  WBC 4.9 6.3   Microbiology Recent Results (from the past 240 hours)  SARS Coronavirus 2 by RT PCR (hospital order, performed in [facility] hospital lab) *cepheid single result test* Anterior Nasal Swab     Status: None    Collection Time: 11/03/23 10:37 PM   Specimen: Anterior Nasal Swab  Result Value Ref Range Status   SARS Coronavirus 2 by RT PCR NEGATIVE NEGATIVE Final    Comment: (NOTE) SARS-CoV-2 target nucleic acids are NOT DETECTED.  The SARS-CoV-2 RNA is generally detectable in upper and lower respiratory specimens during the acute phase of infection. The lowest concentration of SARS-CoV-2 viral copies this assay can detect is 250 copies / mL. A negative result does not preclude SARS-CoV-2 infection and should not be used as the sole basis for treatment or other patient management decisions.  A negative result may occur with improper specimen collection / handling, submission of specimen other than nasopharyngeal swab, presence of viral mutation(s) within the areas targeted by this assay, and inadequate number of viral copies (<250 copies / mL). A negative result must be combined with clinical observations,  patient history, and epidemiological information.  Fact Sheet for Patients:   RoadLapTop.co.za  Fact Sheet for Healthcare Providers: http://kim-miller.com/  This test is not yet approved or  cleared by the [place] FDA and has been authorized for detection and/or diagnosis of SARS-CoV-2 by FDA under an Emergency Use Authorization (EUA).  This EUA will remain in effect (meaning this test can be used) for the duration of the COVID-19 declaration under Section 564(b)(1) of the Act, 21 U.S.C. section 360bbb-3(b)(1), unless the authorization is terminated or revoked sooner.  Performed at [facility], [address] [place], [place] [address]      Total time spend on discharging this patient, including the last patient exam, discussing the hospital stay, instructions for ongoing care as it relates to all pertinent caregivers, as well as preparing the medical discharge records, prescriptions, and/or referrals as applicable, is ***  minutes.    Darlin Priestly, MD  [facility] 11/10/2023, 12:16 PM

## 2023-11-11 LAB — SURGICAL PATHOLOGY

## 2023-11-13 ENCOUNTER — Encounter: Payer: Self-pay | Admitting: Podiatry

## 2023-11-13 ENCOUNTER — Ambulatory Visit (INDEPENDENT_AMBULATORY_CARE_PROVIDER_SITE_OTHER): Payer: 59 | Admitting: Podiatry

## 2023-11-13 ENCOUNTER — Ambulatory Visit (INDEPENDENT_AMBULATORY_CARE_PROVIDER_SITE_OTHER): Payer: 59

## 2023-11-13 DIAGNOSIS — I96 Gangrene, not elsewhere classified: Secondary | ICD-10-CM | POA: Diagnosis not present

## 2023-11-13 DIAGNOSIS — Z89421 Acquired absence of other right toe(s): Secondary | ICD-10-CM | POA: Diagnosis not present

## 2023-11-13 DIAGNOSIS — Z9889 Other specified postprocedural states: Secondary | ICD-10-CM

## 2023-11-13 NOTE — Progress Notes (Signed)
 v  Subjective:  Patient ID: Austin [name], male    DOB: [birth year],  MRN: [id number]  Chief Complaint  Patient presents with   Routine Post Op    POV #1  dos 1/30/ right 4th toe amputation It's doing okay but it aches at night.  It jumps.    DOS: 11/06/2023 Procedure: Right fourth digit amputation  68 y.o. male returns for post-op check.  He states is doing well.  Bandages intact.  No drainage noted.  No strikethrough noted.  Ambulating with surgical shoe  Review of Systems: Negative except as noted in the HPI. Denies N/V/F/Ch.  Past Medical History:  Diagnosis Date   Coronary artery disease    Diabetes (HCC)    DVT (deep venous thrombosis) (HCC)    Gangrene of left foot (HCC) 02/09/2018   GERD (gastroesophageal reflux disease)    Hyperlipidemia    Hypertension    Peripheral vascular disease (HCC)     Current Outpatient Medications:    amoxicillin -clavulanate (AUGMENTIN ) 875-125 MG tablet, Take 1 tablet by mouth every 12 (twelve) hours for 4 days., Disp: 8 tablet, Rfl: 0   ascorbic acid  (VITAMIN C ) 500 MG tablet, Take 1 tablet (500 mg total) by mouth 2 (two) times daily., Disp: 60 tablet, Rfl: 0   atorvastatin  (LIPITOR ) 80 MG tablet, Take 1 tablet (80 mg total) by mouth daily at 6 PM., Disp: 30 tablet, Rfl: 0   Cholecalciferol  (VITAMIN D3) 125 MCG (5000 UT) CAPS, Take 1 capsule by mouth daily., Disp: , Rfl:    clopidogrel  (PLAVIX ) 75 MG tablet, Take 1 tablet (75 mg total) by mouth daily at 6 (six) AM., Disp: 60 tablet, Rfl: 1   ELIQUIS  5 MG TABS tablet, Take 5 mg by mouth 2 (two) times daily., Disp: , Rfl:    gabapentin  (NEURONTIN ) 100 MG capsule, Take 100 mg by mouth 3 (three) times daily., Disp: , Rfl:    insulin  glargine (LANTUS ) 100 UNIT/ML Solostar Pen, Inject 41 Units into the skin 2 (two) times daily. (Patient taking differently: Inject 48 Units into the skin daily.), Disp: 15 mL, Rfl: 0   insulin  lispro (HUMALOG ) 100 UNIT/ML injection, Inject 18 Units into the skin 3  (three) times daily before meals., Disp: , Rfl:    losartan  (COZAAR ) 25 MG tablet, Take 1 tablet (25 mg total) by mouth daily., Disp: 30 tablet, Rfl: 0   NICORETTE STARTER KIT 4 MG gum, Take 4 mg by mouth as needed for smoking cessation., Disp: , Rfl:    TRULICITY 0.75 MG/0.5ML SOPN, Inject 1.5 mg into the skin once a week., Disp: , Rfl:    Zinc  Sulfate 220 (50 Zn) MG TABS, Take 1 tablet (220 mg total) by mouth daily after supper., Disp: 30 tablet, Rfl: 0  Social History   Tobacco Use  Smoking Status Former   Current packs/day: 0.00   Average packs/day: 1 pack/day for 47.0 years (47.0 ttl pk-yrs)   Types: Cigarettes   Start date: 01/1974   Quit date: 01/2021   Years since quitting: 2.8  Smokeless Tobacco Never    No Known Allergies Objective:  There were no vitals filed for this visit. There is no height or weight on file to calculate BMI. Constitutional Well developed. Well nourished.  Vascular Foot warm and well perfused. Capillary refill normal to all digits.   Neurologic Normal speech. Oriented to person, place, and time. Epicritic sensation to light touch grossly present bilaterally.  Dermatologic Skin healing well without signs of infection. Skin  edges well coapted without signs of infection.  Orthopedic: Tenderness to palpation noted about the surgical site.   Radiographs: 3 views of skeletally mature the right foot: Fourth digit amputation noted.  No other bony abnormalities identified no osteomyelitis noted.  History of partial fifth ray amputation noted Assessment:   1. History of amputation of lesser toe of right foot (HCC)    Plan:  Patient was evaluated and treated and all questions answered.  S/p foot surgery  right -Progressing as expected post-operatively. -XR:  see above -WB Status:  weightbearing as tolerated in surgical shoe -Sutures:  intact no signs of dehiscence noted no complication noted. -Medications: None -Foot redressed.  No follow-ups on  file.

## 2023-11-20 ENCOUNTER — Encounter: Payer: Self-pay | Admitting: Podiatry

## 2023-11-20 ENCOUNTER — Ambulatory Visit (INDEPENDENT_AMBULATORY_CARE_PROVIDER_SITE_OTHER): Payer: 59 | Admitting: Podiatry

## 2023-11-20 ENCOUNTER — Encounter (INDEPENDENT_AMBULATORY_CARE_PROVIDER_SITE_OTHER): Payer: 59

## 2023-11-20 ENCOUNTER — Ambulatory Visit (INDEPENDENT_AMBULATORY_CARE_PROVIDER_SITE_OTHER): Payer: 59 | Admitting: Nurse Practitioner

## 2023-11-20 DIAGNOSIS — Z89421 Acquired absence of other right toe(s): Secondary | ICD-10-CM | POA: Diagnosis not present

## 2023-11-20 NOTE — Progress Notes (Signed)
v  Subjective:  Patient ID: Austin [name], male    DOB: 02-19-1956,  MRN: [id number]  Chief Complaint  Patient presents with   Routine Post Op    POV 2 hospital post op/ dos 1/30/ right 4th toe amputation "It's doing pretty good."    DOS: 11/06/2023 Procedure: Right fourth digit amputation  68 y.o. male returns for post-op check.  He states is doing well.  Bandages intact.  No drainage noted.  No strikethrough noted.  Ambulating with surgical shoe  Review of Systems: Negative except as noted in the HPI. Denies N/V/F/Ch.  Past Medical History:  Diagnosis Date   Coronary artery disease    Diabetes (HCC)    DVT (deep venous thrombosis) (HCC)    Gangrene of left foot (HCC) 02/09/2018   GERD (gastroesophageal reflux disease)    Hyperlipidemia    Hypertension    Peripheral vascular disease (HCC)     Current Outpatient Medications:    ascorbic acid (VITAMIN C) 500 MG tablet, Take 1 tablet (500 mg total) by mouth 2 (two) times daily., Disp: 60 tablet, Rfl: 0   atorvastatin (LIPITOR) 80 MG tablet, Take 1 tablet (80 mg total) by mouth daily at 6 PM., Disp: 30 tablet, Rfl: 0   Cholecalciferol (VITAMIN D3) 125 MCG (5000 UT) CAPS, Take 1 capsule by mouth daily., Disp: , Rfl:    clopidogrel (PLAVIX) 75 MG tablet, Take 1 tablet (75 mg total) by mouth daily at 6 (six) AM., Disp: 60 tablet, Rfl: 1   ELIQUIS 5 MG TABS tablet, Take 5 mg by mouth 2 (two) times daily., Disp: , Rfl:    gabapentin (NEURONTIN) 100 MG capsule, Take 100 mg by mouth 3 (three) times daily., Disp: , Rfl:    insulin glargine (LANTUS) 100 UNIT/ML Solostar Pen, Inject 41 Units into the skin 2 (two) times daily. (Patient taking differently: Inject 48 Units into the skin daily.), Disp: 15 mL, Rfl: 0   insulin lispro (HUMALOG) 100 UNIT/ML injection, Inject 18 Units into the skin 3 (three) times daily before meals., Disp: , Rfl:    losartan (COZAAR) 25 MG tablet, Take 1 tablet (25 mg total) by mouth daily., Disp: 30 tablet, Rfl:  0   NICORETTE STARTER KIT 4 MG gum, Take 4 mg by mouth as needed for smoking cessation., Disp: , Rfl:    TRULICITY 0.75 MG/0.5ML SOPN, Inject 1.5 mg into the skin once a week., Disp: , Rfl:    Zinc Sulfate 220 (50 Zn) MG TABS, Take 1 tablet (220 mg total) by mouth daily after supper., Disp: 30 tablet, Rfl: 0  Social History   Tobacco Use  Smoking Status Former   Current packs/day: 0.00   Average packs/day: 1 pack/day for 47.0 years (47.0 ttl pk-yrs)   Types: Cigarettes   Start date: 01/1974   Quit date: 01/2021   Years since quitting: 2.8  Smokeless Tobacco Never    No Known Allergies Objective:  There were no vitals filed for this visit. There is no height or weight on file to calculate BMI. Constitutional Well developed. Well nourished.  Vascular Foot warm and well perfused. Capillary refill normal to all digits.   Neurologic Normal speech. Oriented to person, place, and time. Epicritic sensation to light touch grossly present bilaterally.  Dermatologic Skin completely epithelialized.  No signs of dehiscence noted no complication noted  Orthopedic: No further tenderness to palpation noted about the surgical site.   Radiographs: 3 views of skeletally mature the right foot: Fourth digit  amputation noted.  No other bony abnormalities identified no osteomyelitis noted.  History of partial fifth ray amputation noted Assessment:   1. History of amputation of lesser toe of right foot (HCC)     Plan:  Patient was evaluated and treated and all questions answered.  S/p foot surgery  right -Clinically healed and completely reepithelialized.  At this time I discussed prevention technique.  He will benefit from diabetic shoes.  He will be scheduled to see Trish for diabetic shoes at this time is officially discharged from my care if any foot and ankle issues on future he will come back and see me  No follow-ups on file.

## 2023-11-21 ENCOUNTER — Telehealth: Payer: Self-pay | Admitting: Podiatry

## 2023-11-21 NOTE — Telephone Encounter (Signed)
Patient stated right foot 4th toe was bleeding yesterday, Patient contacted 911, EMT came out this morning rewrapped patient's foot. Patient was advised by EMT to contact provider and find out if patient needs to do anything else. Patient contact telephone number, [phone]

## 2023-11-26 NOTE — Telephone Encounter (Signed)
Pt called again as he has not heard anything back about the toe bleeding a little and the leg being cold. He said his family member was there yesterday and changed the bandage and there was a little blood again. He said the bandage has since came off and it is currently not bleeding and leg is still a little cold but not a lot.  I offered appt tomorrow and pt could not make it in. I have scheduled him to come in on 2/25. Should it still be bleeding and cold to touch?

## 2023-12-02 ENCOUNTER — Ambulatory Visit (INDEPENDENT_AMBULATORY_CARE_PROVIDER_SITE_OTHER): Payer: 59 | Admitting: Podiatry

## 2023-12-02 DIAGNOSIS — T8130XA Disruption of wound, unspecified, initial encounter: Secondary | ICD-10-CM

## 2023-12-02 DIAGNOSIS — Z89421 Acquired absence of other right toe(s): Secondary | ICD-10-CM | POA: Diagnosis not present

## 2023-12-02 MED ORDER — OXYCODONE-ACETAMINOPHEN 5-325 MG PO TABS: 1.0000 | ORAL_TABLET | ORAL | 0 refills | Status: DC | PRN

## 2023-12-02 NOTE — Addendum Note (Signed)
 Addended by: Nicholes Rough on: 12/02/2023 02:45 PM   Modules accepted: Orders

## 2023-12-02 NOTE — Progress Notes (Signed)
 v  Subjective:  Patient ID: Austin [name], male    DOB: Jul 12, 1956,  MRN: [id number]  Chief Complaint  Patient presents with   Routine Post Olena Mater 11/06/23    DOS: 11/06/2023 Procedure: Right fourth digit amputation  68 y.o. male returns for post-op check.  He states is doing well.  Bandages intact.  No drainage noted.  No strikethrough noted.  Ambulating with surgical shoe  Review of Systems: Negative except as noted in the HPI. Denies N/V/F/Ch.  Past Medical History:  Diagnosis Date   Coronary artery disease    Diabetes (HCC)    DVT (deep venous thrombosis) (HCC)    Gangrene of left foot (HCC) 02/09/2018   GERD (gastroesophageal reflux disease)    Hyperlipidemia    Hypertension    Peripheral vascular disease (HCC)     Current Outpatient Medications:    ascorbic acid (VITAMIN C) 500 MG tablet, Take 1 tablet (500 mg total) by mouth 2 (two) times daily., Disp: 60 tablet, Rfl: 0   atorvastatin (LIPITOR) 80 MG tablet, Take 1 tablet (80 mg total) by mouth daily at 6 PM., Disp: 30 tablet, Rfl: 0   Cholecalciferol (VITAMIN D3) 125 MCG (5000 UT) CAPS, Take 1 capsule by mouth daily., Disp: , Rfl:    clopidogrel (PLAVIX) 75 MG tablet, Take 1 tablet (75 mg total) by mouth daily at 6 (six) AM., Disp: 60 tablet, Rfl: 1   ELIQUIS 5 MG TABS tablet, Take 5 mg by mouth 2 (two) times daily., Disp: , Rfl:    gabapentin (NEURONTIN) 100 MG capsule, Take 100 mg by mouth 3 (three) times daily., Disp: , Rfl:    insulin glargine (LANTUS) 100 UNIT/ML Solostar Pen, Inject 41 Units into the skin 2 (two) times daily. (Patient taking differently: Inject 48 Units into the skin daily.), Disp: 15 mL, Rfl: 0   insulin lispro (HUMALOG) 100 UNIT/ML injection, Inject 18 Units into the skin 3 (three) times daily before meals., Disp: , Rfl:    losartan (COZAAR) 25 MG tablet, Take 1 tablet (25 mg total) by mouth daily., Disp: 30 tablet, Rfl: 0   NICORETTE STARTER KIT 4 MG gum, Take 4 mg by mouth as needed for  smoking cessation., Disp: , Rfl:    TRULICITY 0.75 MG/0.5ML SOPN, Inject 1.5 mg into the skin once a week., Disp: , Rfl:    Zinc Sulfate 220 (50 Zn) MG TABS, Take 1 tablet (220 mg total) by mouth daily after supper., Disp: 30 tablet, Rfl: 0  Social History   Tobacco Use  Smoking Status Former   Current packs/day: 0.00   Average packs/day: 1 pack/day for 47.0 years (47.0 ttl pk-yrs)   Types: Cigarettes   Start date: 01/1974   Quit date: 01/2021   Years since quitting: 2.9  Smokeless Tobacco Never    No Known Allergies Objective:  There were no vitals filed for this visit. There is no height or weight on file to calculate BMI. Constitutional Well developed. Well nourished.  Vascular Foot warm and well perfused. Capillary refill normal to all digits.   Neurologic Normal speech. Oriented to person, place, and time. Epicritic sensation to light touch grossly present bilaterally.  Dermatologic Superficial wound dehiscence noted.  No deep wound noted.  No drainage noted.  No signs of infection noted.  Orthopedic: Tenderness to palpation noted about the surgical site.   Radiographs: 3 views of skeletally mature the right foot: Fourth digit amputation noted.  No other bony abnormalities identified  no osteomyelitis noted.  History of partial fifth ray amputation noted Assessment:   No diagnosis found.  Plan:  Patient was evaluated and treated and all questions answered.  S/p foot surgery  right -Progressing as expected post-operatively. -XR:  see above -WB Status:  weightbearing as tolerated in surgical shoe -Sutures: None -Medications: None -Superficial dehiscence noted.  Continue Betadine wet-to-dry dressing changes once a day. -  No follow-ups on file.

## 2023-12-04 ENCOUNTER — Other Ambulatory Visit: Payer: Self-pay

## 2023-12-04 ENCOUNTER — Telehealth (INDEPENDENT_AMBULATORY_CARE_PROVIDER_SITE_OTHER): Payer: Self-pay

## 2023-12-04 ENCOUNTER — Inpatient Hospital Stay
Admission: EM | Admit: 2023-12-04 | Discharge: 2023-12-06 | DRG: 253 | Disposition: A | Discharge status: Home / Self Care | Payer: 59 | Attending: Internal Medicine | Admitting: Internal Medicine

## 2023-12-04 ENCOUNTER — Telehealth: Payer: Self-pay | Admitting: Podiatry

## 2023-12-04 DIAGNOSIS — E785 Hyperlipidemia, unspecified: Secondary | ICD-10-CM | POA: Diagnosis present

## 2023-12-04 DIAGNOSIS — I110 Hypertensive heart disease with heart failure: Secondary | ICD-10-CM | POA: Diagnosis present

## 2023-12-04 DIAGNOSIS — Z89512 Acquired absence of left leg below knee: Secondary | ICD-10-CM | POA: Diagnosis not present

## 2023-12-04 DIAGNOSIS — Z79899 Other long term (current) drug therapy: Secondary | ICD-10-CM

## 2023-12-04 DIAGNOSIS — I7090 Unspecified atherosclerosis: Secondary | ICD-10-CM | POA: Diagnosis present

## 2023-12-04 DIAGNOSIS — Z7902 Long term (current) use of antithrombotics/antiplatelets: Secondary | ICD-10-CM | POA: Diagnosis not present

## 2023-12-04 DIAGNOSIS — Z7985 Long-term (current) use of injectable non-insulin antidiabetic drugs: Secondary | ICD-10-CM

## 2023-12-04 DIAGNOSIS — E663 Overweight: Secondary | ICD-10-CM | POA: Diagnosis present

## 2023-12-04 DIAGNOSIS — Z87891 Personal history of nicotine dependence: Secondary | ICD-10-CM

## 2023-12-04 DIAGNOSIS — E1151 Type 2 diabetes mellitus with diabetic peripheral angiopathy without gangrene: Secondary | ICD-10-CM | POA: Diagnosis present

## 2023-12-04 DIAGNOSIS — Z9889 Other specified postprocedural states: Secondary | ICD-10-CM | POA: Diagnosis not present

## 2023-12-04 DIAGNOSIS — I739 Peripheral vascular disease, unspecified: Secondary | ICD-10-CM | POA: Diagnosis present

## 2023-12-04 DIAGNOSIS — Z6828 Body mass index (BMI) 28.0-28.9, adult: Secondary | ICD-10-CM | POA: Diagnosis not present

## 2023-12-04 DIAGNOSIS — I5032 Chronic diastolic (congestive) heart failure: Secondary | ICD-10-CM | POA: Diagnosis present

## 2023-12-04 DIAGNOSIS — Z86718 Personal history of other venous thrombosis and embolism: Secondary | ICD-10-CM | POA: Diagnosis not present

## 2023-12-04 DIAGNOSIS — Z8249 Family history of ischemic heart disease and other diseases of the circulatory system: Secondary | ICD-10-CM | POA: Diagnosis not present

## 2023-12-04 DIAGNOSIS — I82409 Acute embolism and thrombosis of unspecified deep veins of unspecified lower extremity: Secondary | ICD-10-CM | POA: Diagnosis present

## 2023-12-04 DIAGNOSIS — Z89431 Acquired absence of right foot: Secondary | ICD-10-CM

## 2023-12-04 DIAGNOSIS — Z955 Presence of coronary angioplasty implant and graft: Secondary | ICD-10-CM

## 2023-12-04 DIAGNOSIS — I70421 Atherosclerosis of autologous vein bypass graft(s) of the extremities with rest pain, right leg: Secondary | ICD-10-CM | POA: Diagnosis not present

## 2023-12-04 DIAGNOSIS — I482 Chronic atrial fibrillation, unspecified: Secondary | ICD-10-CM | POA: Diagnosis present

## 2023-12-04 DIAGNOSIS — E669 Obesity, unspecified: Secondary | ICD-10-CM | POA: Diagnosis present

## 2023-12-04 DIAGNOSIS — Z1152 Encounter for screening for COVID-19: Secondary | ICD-10-CM | POA: Diagnosis not present

## 2023-12-04 DIAGNOSIS — I70221 Atherosclerosis of native arteries of extremities with rest pain, right leg: Principal | ICD-10-CM | POA: Diagnosis present

## 2023-12-04 DIAGNOSIS — Z794 Long term (current) use of insulin: Secondary | ICD-10-CM

## 2023-12-04 DIAGNOSIS — Z89022 Acquired absence of left finger(s): Secondary | ICD-10-CM

## 2023-12-04 DIAGNOSIS — I10 Essential (primary) hypertension: Secondary | ICD-10-CM | POA: Diagnosis present

## 2023-12-04 DIAGNOSIS — Z89421 Acquired absence of other right toe(s): Secondary | ICD-10-CM

## 2023-12-04 DIAGNOSIS — I700 Atherosclerosis of aorta: Secondary | ICD-10-CM | POA: Diagnosis not present

## 2023-12-04 DIAGNOSIS — Z7901 Long term (current) use of anticoagulants: Secondary | ICD-10-CM | POA: Diagnosis not present

## 2023-12-04 DIAGNOSIS — Z833 Family history of diabetes mellitus: Secondary | ICD-10-CM

## 2023-12-04 DIAGNOSIS — I2510 Atherosclerotic heart disease of native coronary artery without angina pectoris: Secondary | ICD-10-CM | POA: Diagnosis present

## 2023-12-04 LAB — CBC
HCT: 41.8 % (ref 39.0–52.0)
Hemoglobin: 14 g/dL (ref 13.0–17.0)
MCH: 28.1 pg (ref 26.0–34.0)
MCHC: 33.5 g/dL (ref 30.0–36.0)
MCV: 83.9 fL (ref 80.0–100.0)
Platelets: 182 10*3/uL (ref 150–400)
RBC: 4.98 MIL/uL (ref 4.22–5.81)
RDW: 15.2 % (ref 11.5–15.5)
WBC: 10.7 10*3/uL — ABNORMAL HIGH (ref 4.0–10.5)
nRBC: 0 % (ref 0.0–0.2)

## 2023-12-04 LAB — HEPARIN LEVEL (UNFRACTIONATED): Heparin Unfractionated: >1.1 [IU]/mL — ABNORMAL HIGH (ref 0.30–0.70)

## 2023-12-04 LAB — COMPREHENSIVE METABOLIC PANEL
ALT: 15 U/L (ref 0–44)
AST: 20 U/L (ref 15–41)
Albumin: 3.2 g/dL — ABNORMAL LOW (ref 3.5–5.0)
Alkaline Phosphatase: 65 U/L (ref 38–126)
Anion gap: 10 (ref 5–15)
BUN: 13 mg/dL (ref 8–23)
CO2: 24 mmol/L (ref 22–32)
Calcium: 8.9 mg/dL (ref 8.9–10.3)
Chloride: 106 mmol/L (ref 98–111)
Creatinine, Ser: 0.79 mg/dL (ref 0.61–1.24)
GFR, Estimated: >60 mL/min (ref >60)
Glucose, Bld: 130 mg/dL — ABNORMAL HIGH (ref 70–99)
Potassium: 3.8 mmol/L (ref 3.5–5.1)
Sodium: 140 mmol/L (ref 135–145)
Total Bilirubin: 1 mg/dL (ref 0.0–1.2)
Total Protein: 6.7 g/dL (ref 6.5–8.1)

## 2023-12-04 LAB — PROTIME-INR
INR: 1.2 (ref 0.8–1.2)
Prothrombin Time: 15.9 s — ABNORMAL HIGH (ref 11.4–15.2)

## 2023-12-04 LAB — BRAIN NATRIURETIC PEPTIDE: B Natriuretic Peptide: 29.3 pg/mL (ref 0.0–100.0)

## 2023-12-04 LAB — GLUCOSE, CAPILLARY: Glucose-Capillary: 216 mg/dL — ABNORMAL HIGH (ref 70–99)

## 2023-12-04 LAB — APTT: aPTT: 36 s (ref 24–36)

## 2023-12-04 MED ORDER — LOSARTAN POTASSIUM 25 MG PO TABS
25.0000 mg | ORAL_TABLET | Freq: Every day | ORAL | Status: DC
  Administered 2023-12-05 – 2023-12-06 (×2): 25 mg via ORAL
  Filled 2023-12-04 (×2): qty 1

## 2023-12-04 MED ORDER — INSULIN ASPART 100 UNIT/ML IJ SOLN
0.0000 [IU] | Freq: Every day | INTRAMUSCULAR | Status: DC
  Administered 2023-12-04: 2 [IU] via SUBCUTANEOUS
  Filled 2023-12-04: qty 1

## 2023-12-04 MED ORDER — ATORVASTATIN CALCIUM 80 MG PO TABS
80.0000 mg | ORAL_TABLET | Freq: Every day | ORAL | Status: DC
  Administered 2023-12-04 – 2023-12-05 (×2): 80 mg via ORAL
  Filled 2023-12-04 (×2): qty 1

## 2023-12-04 MED ORDER — ONDANSETRON HCL 4 MG/2ML IJ SOLN: 4.0000 mg | Freq: Three times a day (TID) | INTRAMUSCULAR | Status: DC | PRN

## 2023-12-04 MED ORDER — ZINC SULFATE 220 (50 ZN) MG PO CAPS
220.0000 mg | ORAL_CAPSULE | Freq: Every day | ORAL | Status: DC
  Administered 2023-12-05: 220 mg via ORAL
  Filled 2023-12-04: qty 1

## 2023-12-04 MED ORDER — INSULIN ASPART 100 UNIT/ML IJ SOLN
0.0000 [IU] | Freq: Three times a day (TID) | INTRAMUSCULAR | Status: DC
  Administered 2023-12-06: 2 [IU] via SUBCUTANEOUS
  Filled 2023-12-04: qty 1

## 2023-12-04 MED ORDER — OXYCODONE-ACETAMINOPHEN 5-325 MG PO TABS
1.0000 | ORAL_TABLET | ORAL | Status: DC | PRN
  Administered 2023-12-06: 1 via ORAL
  Filled 2023-12-04: qty 1

## 2023-12-04 MED ORDER — INSULIN GLARGINE-YFGN 100 UNIT/ML ~~LOC~~ SOLN
40.0000 [IU] | Freq: Every day | SUBCUTANEOUS | Status: DC
  Administered 2023-12-04 – 2023-12-05 (×2): 40 [IU] via SUBCUTANEOUS
  Filled 2023-12-04 (×3): qty 0.4

## 2023-12-04 MED ORDER — ACETAMINOPHEN 325 MG PO TABS: 650.0000 mg | ORAL_TABLET | Freq: Four times a day (QID) | ORAL | Status: DC | PRN

## 2023-12-04 MED ORDER — VITAMIN C 500 MG PO TABS
500.0000 mg | ORAL_TABLET | Freq: Two times a day (BID) | ORAL | Status: DC
  Administered 2023-12-04 – 2023-12-06 (×4): 500 mg via ORAL
  Filled 2023-12-04 (×4): qty 1

## 2023-12-04 MED ORDER — MORPHINE SULFATE (PF) 2 MG/ML IV SOLN: 2.0000 mg | INTRAVENOUS | Status: DC | PRN

## 2023-12-04 MED ORDER — HEPARIN (PORCINE) 25000 UT/250ML-% IV SOLN
1600.0000 [IU]/h | INTRAVENOUS | Status: DC
  Administered 2023-12-04: 1600 [IU]/h via INTRAVENOUS
  Filled 2023-12-04: qty 250

## 2023-12-04 MED ORDER — GABAPENTIN 100 MG PO CAPS
100.0000 mg | ORAL_CAPSULE | Freq: Three times a day (TID) | ORAL | Status: DC
  Administered 2023-12-04 – 2023-12-06 (×5): 100 mg via ORAL
  Filled 2023-12-04 (×5): qty 1

## 2023-12-04 MED ORDER — HEPARIN BOLUS VIA INFUSION
6000.0000 [IU] | Freq: Once | INTRAVENOUS | Status: AC
  Administered 2023-12-04: 6000 [IU] via INTRAVENOUS
  Filled 2023-12-04: qty 6000

## 2023-12-04 MED ORDER — HYDRALAZINE HCL 20 MG/ML IJ SOLN: 5.0000 mg | INTRAMUSCULAR | Status: DC | PRN

## 2023-12-04 MED ORDER — VITAMIN D 25 MCG (1000 UNIT) PO TABS
5000.0000 [IU] | ORAL_TABLET | Freq: Every day | ORAL | Status: DC
  Administered 2023-12-05 – 2023-12-06 (×2): 5000 [IU] via ORAL
  Filled 2023-12-04 (×2): qty 5

## 2023-12-04 NOTE — Telephone Encounter (Signed)
 The home health nurse Etheleen Nicks called and is currently with the pt and is needing the baseline for him as she has some concerns. She would like a call as soon as possible.  Number is [phone]

## 2023-12-04 NOTE — ED Notes (Signed)
 Provided lunch tray and urinal per request

## 2023-12-04 NOTE — Consult Note (Signed)
 Hospital Consult    Reason for Consult:  Right Lower Extremity Ischemia Requesting Physician:  Dr Minna Antis MD MRN #:  [id number]  History of Present Illness: This is a 68 y.o. male with a past medical history of CAD, diabetes, gastric reflux, hypertension, hyperlipidemia, prior left BKA presents to the emergency department for right lower extremity discomfort and swelling over the last 1 week or so. According to the patient for the last week or so he has been experiencing some intermittent pain in the right leg. States his home health nurses noted some increased swelling to the leg as well so sent him to the emergency department today for evaluation.   On exam this morning the patient was resting comfortably in bed.  I was able to Doppler PT pulses which were weak and strong femoral pulses with a pulse at the end of an incision from his prior bypass.  I was unable to get up popliteal pulse.  Patient endorses his right lower extremity is warmer to touch this morning and feels a little better.  Scaler surgery to evaluate.    Past Medical History:  Diagnosis Date   Coronary artery disease    Diabetes (HCC)    DVT (deep venous thrombosis) (HCC)    Gangrene of left foot (HCC) 02/09/2018   GERD (gastroesophageal reflux disease)    Hyperlipidemia    Hypertension    Peripheral vascular disease (HCC)     Past Surgical History:  Procedure Laterality Date   ABDOMINAL AORTOGRAM W/LOWER EXTREMITY N/A 02/20/2022   Procedure: ABDOMINAL AORTOGRAM W/LOWER EXTREMITY;  Surgeon: Cephus Shelling, MD;  [facility]: [facility];  Service: Cardiovascular;  Laterality: N/A;   AMPUTATION Left 10/28/2018   Procedure: AMPUTATION BELOW KNEE;  Surgeon: Annice Needy, MD;  Location: [facility];  Service: Vascular;  Laterality: Left;   AMPUTATION Left 09/10/2021   Procedure: AMPUTATION DIGIT;  Surgeon: Allena Napoleon, MD;  Location: [facility];  Service: Plastics;  Laterality: Left;   AMPUTATION Left  09/14/2021   Procedure: AMPUTATION DIGIT left long finger and irrigation and debridement;  Surgeon: Allena Napoleon, MD;  Location: [facility];  Service: Plastics;  Laterality: Left;   AMPUTATION Right 02/27/2022   Procedure: RIGHT PARTIAL AMPUTATION FOOT;  Surgeon: Felecia Shelling, DPM;  Location: [facility];  Service: Podiatry;  Laterality: Right;   AMPUTATION TOE Left 02/10/2018   Procedure: AMPUTATION TOE;  Surgeon: Linus Galas, DPM;  Location: [facility];  Service: Podiatry;  Laterality: Left;   AMPUTATION TOE Right 11/06/2023   Procedure: Right 4th Toe Amputation;  Surgeon: Pilar Plate, DPM;  Location: [facility];  Service: Orthopedics/Podiatry;  Laterality: Right;   APPLICATION OF WOUND VAC Left 10/16/2018   Procedure: APPLICATION OF WOUND VAC;  Surgeon: Gwyneth Revels, DPM;  Location: [facility];  Service: Podiatry;  Laterality: Left;   DORSAL SLIT N/A 01/12/2021   Procedure: DORSAL SLIT;  Surgeon: Sondra Come, MD;  Location: [facility];  Service: Urology;  Laterality: N/A;   FEMORAL-TIBIAL BYPASS GRAFT Right 02/25/2022   Procedure: RIGHT LOWER EXTREMITY FEMORAL TO TIBIAL PERONEAL TRUNK BYPASS;  Surgeon: Cephus Shelling, MD;  Location: [facility];  Service: Vascular;  Laterality: Right;  INSERT ARTERIAL LINE   groin surgery     IRRIGATION AND DEBRIDEMENT FOOT Left 09/30/2018   Procedure: IRRIGATION AND DEBRIDEMENT FOOT;  Surgeon: Linus Galas, DPM;  Location: [facility];  Service: Podiatry;  Laterality: Left;   LOWER EXTREMITY ANGIOGRAPHY Left 02/12/2018   Procedure:  Lower Extremity Angiography;  Surgeon: Annice Needy, MD;  Location: [facility];  Service: Cardiovascular;  Laterality: Left;   LOWER EXTREMITY ANGIOGRAPHY Left 10/01/2018   Procedure: Lower Extremity Angiography;  Surgeon: Annice Needy, MD;  Location: [facility];  Service: Cardiovascular;  Laterality: Left;   LOWER EXTREMITY ANGIOGRAPHY Left 10/19/2018   Procedure: Lower Extremity Angiography;  Surgeon: Annice Needy,  MD;  Location: [facility];  Service: Cardiovascular;  Laterality: Left;   LOWER EXTREMITY ANGIOGRAPHY Left 10/20/2018   Procedure: LOWER EXTREMITY ANGIOGRAPHY;  Surgeon: Annice Needy, MD;  Location: [facility];  Service: Cardiovascular;  Laterality: Left;   LOWER EXTREMITY ANGIOGRAPHY Right 11/25/2018   Procedure: LOWER EXTREMITY ANGIOGRAPHY;  Surgeon: Annice Needy, MD;  Location: [facility];  Service: Cardiovascular;  Laterality: Right;   LOWER EXTREMITY ANGIOGRAPHY Right 01/10/2020   Procedure: LOWER EXTREMITY ANGIOGRAPHY;  Surgeon: Annice Needy, MD;  Location: [facility];  Service: Cardiovascular;  Laterality: Right;   LOWER EXTREMITY ANGIOGRAPHY Right 12/11/2020   Procedure: LOWER EXTREMITY ANGIOGRAPHY;  Surgeon: Annice Needy, MD;  Location: [facility];  Service: Cardiovascular;  Laterality: Right;   LOWER EXTREMITY ANGIOGRAPHY Right 06/26/2023   Procedure: Lower Extremity Angiography;  Surgeon: Renford Dills, MD;  Location: [facility];  Service: Cardiovascular;  Laterality: Right;   LOWER EXTREMITY ANGIOGRAPHY Right 11/05/2023   Procedure: Lower Extremity Angiography;  Surgeon: Annice Needy, MD;  Location: [facility];  Service: Cardiovascular;  Laterality: Right;   LOWER EXTREMITY INTERVENTION  02/12/2018   Procedure: LOWER EXTREMITY INTERVENTION;  Surgeon: Annice Needy, MD;  Location: [facility];  Service: Cardiovascular;;   MINOR IRRIGATION AND DEBRIDEMENT OF WOUND Left 09/10/2021   Procedure: IRRIGATION AND DEBRIDEMENT OF LEFT LONG FINGER WOUND;  Surgeon: Allena Napoleon, MD;  Location: [facility];  Service: Plastics;  Laterality: Left;   NECK SURGERY     PERIPHERAL VASCULAR INTERVENTION  02/20/2022   Procedure: PERIPHERAL VASCULAR INTERVENTION;  Surgeon: Cephus Shelling, MD;  [facility];  Service: Cardiovascular;;  right iliac   TRANSMETATARSAL AMPUTATION Left 10/16/2018   Procedure:  TRANSMETATARSAL AMPUTATION LEFT FOOT;  Surgeon: Gwyneth Revels, DPM;  Location: [facility];  Service: Podiatry;  Laterality: Left;   VEIN HARVEST Right 02/25/2022   Procedure: VEIN HARVEST OF RIGHT GREATER SAPHENOUS VEIN;  Surgeon: Cephus Shelling, MD;  Location: [facility];  Service: Vascular;  Laterality: Right;    No Known Allergies  Prior to Admission medications   Medication Sig Start Date End Date Taking? Authorizing Provider  ascorbic acid (VITAMIN C) 500 MG tablet Take 1 tablet (500 mg total) by mouth 2 (two) times daily. 03/21/22   Love, Evlyn Kanner, PA-C  atorvastatin (LIPITOR) 80 MG tablet Take 1 tablet (80 mg total) by mouth daily at 6 PM. 03/21/22   Love, Evlyn Kanner, PA-C  Cholecalciferol (VITAMIN D3) 125 MCG (5000 UT) CAPS Take 1 capsule by mouth daily. 04/17/22   [provider]  clopidogrel (PLAVIX) 75 MG tablet Take 1 tablet (75 mg total) by mouth daily at 6 (six) AM. 09/26/23   Georgiana Spinner, NP  ELIQUIS 5 MG TABS tablet Take 5 mg by mouth 2 (two) times daily. 10/21/23   [provider]  gabapentin (NEURONTIN) 100 MG capsule Take 100 mg by mouth 3 (three) times daily.    [provider]  insulin glargine (LANTUS)  100 UNIT/ML Solostar Pen Inject 41 Units into the skin 2 (two) times daily. Patient taking differently: Inject 48 Units into the skin daily. 03/21/22   Love, Evlyn Kanner, PA-C  insulin lispro (HUMALOG) 100 UNIT/ML injection Inject 18 Units into the skin 3 (three) times daily before meals.    [provider]  losartan (COZAAR) 25 MG tablet Take 1 tablet (25 mg total) by mouth daily. 03/21/22   Love, Evlyn Kanner, PA-C  NICORETTE STARTER KIT 4 MG gum Take 4 mg by mouth as needed for smoking cessation. 02/05/23   [provider]  oxyCODONE-acetaminophen (PERCOCET) 5-325 MG tablet Take 1 tablet by mouth every 4 (four) hours as needed for severe pain (pain score 7-10). 12/02/23   Candelaria Stagers, DPM  TRULICITY 0.75 MG/0.5ML SOPN Inject 1.5 mg into the  skin once a week. 03/22/22   [provider]  Zinc Sulfate 220 (50 Zn) MG TABS Take 1 tablet (220 mg total) by mouth daily after supper. 03/21/22   Love, Evlyn Kanner, PA-C    Social History   Socioeconomic History   Marital status: Divorced    Spouse name: Not on file   Number of children: Not on file   Years of education: Not on file   Highest education level: Not on file  Occupational History   Not on file  Tobacco Use   Smoking status: Former    Current packs/day: 0.00    Average packs/day: 1 pack/day for 47.0 years (47.0 ttl pk-yrs)    Types: Cigarettes    Start date: 01/1974    Quit date: 01/2021    Years since quitting: 2.9   Smokeless tobacco: Never  Vaping Use   Vaping status: Never Used  Substance and Sexual Activity   Alcohol use: No   Drug use: No   Sexual activity: Not Currently  Other Topics Concern   Not on file  Social History Narrative   Not on file   Social Drivers of Health   Financial Resource Strain: Not on file  Food Insecurity: No Food Insecurity (11/04/2023)   Hunger Vital Sign    Worried About Running Out of Food in the Last Year: Never true    Ran Out of Food in the Last Year: Never true  Transportation Needs: No Transportation Needs (11/04/2023)   PRAPARE - Administrator, Civil Service (Medical): No    Lack of Transportation (Non-Medical): No  Physical Activity: Not on file  Stress: Not on file  Social Connections: Moderately Isolated (11/04/2023)   Social Connection and Isolation Panel [NHANES]    Frequency of Communication with Friends and Family: More than three times a week    Frequency of Social Gatherings with Friends and Family: More than three times a week    Attends Religious Services: 1 to 4 times per year    Active Member of [employer] or Organizations: No    Attends Banker Meetings: Never    Marital Status: Divorced  Catering manager Violence: Not At Risk (11/04/2023)   Humiliation, Afraid, Rape, and  Kick questionnaire    Fear of Current or Ex-Partner: No    Emotionally Abused: No    Physically Abused: No    Sexually Abused: No     Family History  Problem Relation Age of Onset   Diabetes Mother    Coronary artery disease Father    Hypertension Sister    Leukemia Brother     ROS: Otherwise negative unless mentioned in  HPI  Physical Examination  Vitals:   12/04/23 1738  BP: 134/83  Pulse: 86  Resp: 16  Temp: 98 F (36.7 C)   Body mass index is 28.36 kg/m.  General:  WDWN in NAD Gait: Not observed HENT: WNL, normocephalic Pulmonary: normal non-labored breathing, without Rales, rhonchi,  wheezing Cardiac: regular, without  Murmurs, rubs or gallops; without carotid bruits Abdomen: Positive bowel sounds throughout, soft, NT/ND, no masses Skin: without rashes Vascular Exam/Pulses: Doppler pulses are present but weak in the right PD, strong Doppler pulses the femoral artery, no popliteal Doppler pulse available. Extremities: with ischemic changes, without Gangrene , without cellulitis; with open wounds;  Musculoskeletal: no muscle wasting or atrophy  Neurologic: A&O X 3;  No focal weakness or paresthesias are detected; speech is fluent/normal Psychiatric:  The pt has Normal affect. Lymph:  Unremarkable  CBC    Component Value Date/Time   WBC 10.7 (H) 12/04/2023 1744   RBC 4.98 12/04/2023 1744   HGB 14.0 12/04/2023 1744   HGB 14.1 11/17/2013 0621   HCT 41.8 12/04/2023 1744   HCT 41.0 02/16/2022 0608   PLT 182 12/04/2023 1744   PLT 218 11/17/2013 0621   MCV 83.9 12/04/2023 1744   MCV 86 11/17/2013 0621   MCH 28.1 12/04/2023 1744   MCHC 33.5 12/04/2023 1744   RDW 15.2 12/04/2023 1744   RDW 13.6 11/17/2013 0621   LYMPHSABS 2.1 11/03/2023 1213   LYMPHSABS 2.4 11/17/2013 0621   MONOABS 1.1 (H) 11/03/2023 1213   MONOABS 0.9 11/17/2013 0621   EOSABS 0.0 11/03/2023 1213   EOSABS 0.1 11/17/2013 0621   BASOSABS 0.0 11/03/2023 1213   BASOSABS 0.0 11/17/2013 0621     BMET    Component Value Date/Time   NA 140 11/07/2023 0503   NA 135 (L) 11/17/2013 0621   K 4.4 11/07/2023 0503   K 4.3 11/17/2013 0621   CL 105 11/07/2023 0503   CL 99 11/17/2013 0621   CO2 26 11/07/2023 0503   CO2 31 11/17/2013 0621   GLUCOSE 175 (H) 11/07/2023 0503   GLUCOSE 172 (H) 11/17/2013 0621   BUN 17 11/07/2023 0503   BUN 10 11/17/2013 0621   CREATININE 0.80 11/07/2023 0503   CREATININE 1.05 04/18/2022 1034   CALCIUM 8.7 (L) 11/07/2023 0503   CALCIUM 9.6 11/17/2013 0621   GFRNONAA >60 11/07/2023 0503   GFRNONAA >60 11/17/2013 0621   GFRAA >60 01/10/2020 0808   GFRAA >60 11/17/2013 0621    COAGS: Lab Results  Component Value Date   INR 1.2 06/25/2023   INR 1.3 (H) 02/15/2022   INR 1.2 03/31/2021     Non-Invasive Vascular Imaging:   Xray of right foot ordered.   Statin:  Yes.   Beta Blocker:  No. Aspirin:  No. ACEI:  No. ARB:  Yes.   CCB use:  No Other antiplatelets/anticoagulants:  Yes.   Eliquis 5 mg BID, Plavix 75 mg Daily   ASSESSMENT/PLAN: This is a 68 y.o. male who presents to [facility] emergency department with worsening pain over the past week to his right lower extremity.  He also endorsed that his right lower extremity was cold.  Doppler pulses even though they were weak were attainable at the posttibial.  Strong Doppler pulses in the right femoral artery.  Unable to obtain a Doppler pulse in the right popliteal artery  Vascular surgery plans on taking the patient to the vascular lab today on 12/05/2023 for right lower extremity angiogram with possible intervention.  I had

## 2023-12-04 NOTE — H&P (Signed)
 History and Physical    Austin [name] ZOX:[id number] DOB: March 28, 1956 DOA: 12/04/2023  Referring MD/NP/PA:   PCP: Preston Fleeting, MD   Patient coming from:  The patient is coming from home.     Chief Complaint: right lower leg pain  HPI: Austin [name] is a 68 y.o. male with medical history significant of PVD (s/p of right common femoral to TP trunk bypass with vein on 02/25/2022), HTN, HLD,m DM, CAD, dCHF, DVT and A fib on Eliquis, s/p of left BKA, obesity, former smoker, recent amputation of right 5th toe, who presents with right leg pain.   Pt states that he has right lower extremity pain and swelling for more than 1 week. His home health nurses noticed that his right leg is cold to touch.  No fever or chills.  His right lower leg pain is constant, aching, moderate, nonradiating, not aggravated or alleviated by any known factors.  Patient denies chest pain, cough, SOB.  No nausea, vomiting, diarrhea or abdominal pain.  No symptoms of UTI. Pt is s/p of recent right 5th toe amputation, still has open surgical wound.  Per ED physician, there is a very faint dopplerable pulse in the right DP.     Pt has vascular procedure on 11/05/2023 by Dr. Wyn Quaker:              1.  Ultrasound guidance for vascular access left femoral artery             2.  Catheter placement into right common femoral artery from left femoral approach             3.  Aortogram and selective right lower extremity angiogram             4.  Percutaneous transluminal angioplasty of right peroneal artery and tibioperoneal trunk with 3 mm diameter angioplasty balloon             5.  Percutaneous transluminal angioplasty of the distal portion of the right femoral to tibioperoneal trunk bypass analogous to the popliteal artery with 4 mm diameter by 15 cm length [name] drug-coated angioplasty balloon             6.  StarClose closure device left femoral artery    Data reviewed independently and ED Course: pt was found to  have WBC 10.7, GFR> 60, INR 1.2, PTT 36, temperature normal, blood pressure 114/82, heart rate 85, RR 20, oxygen saturation 97% on room air.  Patient is admitted to telemetry bed as inpatient.  Dr. Gilda Crease of [facility] is consulted.   EKG:   Not done in ED, will get one.      Review of Systems:   General: no fevers, chills, no body weight gain, has fatigue HEENT: no blurry vision, hearing changes or sore throat Respiratory: no dyspnea, coughing, wheezing CV: no chest pain, no palpitations GI: no nausea, vomiting, abdominal pain, diarrhea, constipation GU: no dysuria, burning on urination, increased urinary frequency, hematuria  Ext: has right leg edema and pain Neuro: no unilateral weakness, numbness, or tingling, no vision change or hearing loss Skin: no rash, no skin tear. MSK: No muscle spasm, no deformity, no limitation of range of movement in spin Heme: No easy bruising.  Travel history: No recent long distant travel.   Allergy: No Known Allergies  Past Medical History:  Diagnosis Date   Coronary artery disease    Diabetes (HCC)    DVT (deep venous thrombosis) (HCC)    Gangrene  of left foot (HCC) 02/09/2018   GERD (gastroesophageal reflux disease)    Hyperlipidemia    Hypertension    Peripheral vascular disease (HCC)     Past Surgical History:  Procedure Laterality Date   ABDOMINAL AORTOGRAM W/LOWER EXTREMITY N/A 02/20/2022   Procedure: ABDOMINAL AORTOGRAM W/LOWER EXTREMITY;  Surgeon: Cephus Shelling, MD;  Location: [facility];  Service: Cardiovascular;  Laterality: N/A;   AMPUTATION Left 10/28/2018   Procedure: AMPUTATION BELOW KNEE;  Surgeon: Annice Needy, MD;  Location: [facility];  Service: Vascular;  Laterality: Left;   AMPUTATION Left 09/10/2021   Procedure: AMPUTATION DIGIT;  Surgeon: Allena Napoleon, MD;  Location: [facility];  Service: Plastics;  Laterality: Left;   AMPUTATION Left 09/14/2021   Procedure: AMPUTATION DIGIT left long finger and irrigation and  debridement;  Surgeon: Allena Napoleon, MD;  Location: [facility];  Service: Plastics;  Laterality: Left;   AMPUTATION Right 02/27/2022   Procedure: RIGHT PARTIAL AMPUTATION FOOT;  Surgeon: Felecia Shelling, DPM;  Location: [facility];  Service: Podiatry;  Laterality: Right;   AMPUTATION TOE Left 02/10/2018   Procedure: AMPUTATION TOE;  Surgeon: Linus Galas, DPM;  Location: [facility];  Service: Podiatry;  Laterality: Left;   AMPUTATION TOE Right 11/06/2023   Procedure: Right 4th Toe Amputation;  Surgeon: Pilar Plate, DPM;  Location: [facility];  Service: Orthopedics/Podiatry;  Laterality: Right;   APPLICATION OF WOUND VAC Left 10/16/2018   Procedure: APPLICATION OF WOUND VAC;  Surgeon: Gwyneth Revels, DPM;  Location: [facility];  Service: Podiatry;  Laterality: Left;   DORSAL SLIT N/A 01/12/2021   Procedure: DORSAL SLIT;  Surgeon: Sondra Come, MD;  Location: [facility];  Service: Urology;  Laterality: N/A;   FEMORAL-TIBIAL BYPASS GRAFT Right 02/25/2022   Procedure: RIGHT LOWER EXTREMITY FEMORAL TO TIBIAL PERONEAL TRUNK BYPASS;  Surgeon: Cephus Shelling, MD;  Location: [facility];  Service: Vascular;  Laterality: Right;  INSERT ARTERIAL LINE   groin surgery     IRRIGATION AND DEBRIDEMENT FOOT Left 09/30/2018   Procedure: IRRIGATION AND DEBRIDEMENT FOOT;  Surgeon: Linus Galas, DPM;  Location: [facility];  Service: Podiatry;  Laterality: Left;   LOWER EXTREMITY ANGIOGRAPHY Left 02/12/2018   Procedure: Lower Extremity Angiography;  Surgeon: Annice Needy, MD;  Location: [facility];  Service: Cardiovascular;  Laterality: Left;   LOWER EXTREMITY ANGIOGRAPHY Left 10/01/2018   Procedure: Lower Extremity Angiography;  Surgeon: Annice Needy, MD;  Location: [facility];  Service: Cardiovascular;  Laterality: Left;   LOWER EXTREMITY ANGIOGRAPHY Left 10/19/2018   Procedure: Lower Extremity Angiography;  Surgeon: Annice Needy, MD;  Location: [facility];  Service: Cardiovascular;  Laterality:  Left;   LOWER EXTREMITY ANGIOGRAPHY Left 10/20/2018   Procedure: LOWER EXTREMITY ANGIOGRAPHY;  Surgeon: Annice Needy, MD;  Location: [facility];  Service: Cardiovascular;  Laterality: Left;   LOWER EXTREMITY ANGIOGRAPHY Right 11/25/2018   Procedure: LOWER EXTREMITY ANGIOGRAPHY;  Surgeon: Annice Needy, MD;  Location: [facility];  Service: Cardiovascular;  Laterality: Right;   LOWER EXTREMITY ANGIOGRAPHY Right 01/10/2020   Procedure: LOWER EXTREMITY ANGIOGRAPHY;  Surgeon: Annice Needy, MD;  Location: [facility];  Service: Cardiovascular;  Laterality: Right;   LOWER EXTREMITY ANGIOGRAPHY Right 12/11/2020   Procedure: LOWER EXTREMITY ANGIOGRAPHY;  Surgeon: Annice Needy, MD;  Location: [facility];  Service: Cardiovascular;  Laterality: Right;   LOWER EXTREMITY ANGIOGRAPHY Right 06/26/2023   Procedure:  Lower Extremity Angiography;  Surgeon: Renford Dills, MD;  Location: [facility];  Service: Cardiovascular;  Laterality: Right;   LOWER EXTREMITY ANGIOGRAPHY Right 11/05/2023   Procedure: Lower Extremity Angiography;  Surgeon: Annice Needy, MD;  Location: [facility];  Service: Cardiovascular;  Laterality: Right;   LOWER EXTREMITY INTERVENTION  02/12/2018   Procedure: LOWER EXTREMITY INTERVENTION;  Surgeon: Annice Needy, MD;  Location: [facility];  Service: Cardiovascular;;   MINOR IRRIGATION AND DEBRIDEMENT OF WOUND Left 09/10/2021   Procedure: IRRIGATION AND DEBRIDEMENT OF LEFT LONG FINGER WOUND;  Surgeon: Allena Napoleon, MD;  Location: [facility];  Service: Plastics;  Laterality: Left;   NECK SURGERY     PERIPHERAL VASCULAR INTERVENTION  02/20/2022   Procedure: PERIPHERAL VASCULAR INTERVENTION;  Surgeon: Cephus Shelling, MD;  Location: [facility];  Service: Cardiovascular;;  right iliac   TRANSMETATARSAL AMPUTATION Left 10/16/2018   Procedure: TRANSMETATARSAL AMPUTATION LEFT FOOT;  Surgeon: Gwyneth Revels, DPM;  Location: [facility];   Service: Podiatry;  Laterality: Left;   VEIN HARVEST Right 02/25/2022   Procedure: VEIN HARVEST OF RIGHT GREATER SAPHENOUS VEIN;  Surgeon: Cephus Shelling, MD;  Location: [facility];  Service: Vascular;  Laterality: Right;    Social History:  reports that he quit smoking about 2 years ago. His smoking use included cigarettes. He started smoking about 49 years ago. He has a 47 pack-year smoking history. He has never used smokeless tobacco. He reports that he does not drink alcohol and does not use drugs.  Family History:  Family History  Problem Relation Age of Onset   Diabetes Mother    Coronary artery disease Father    Hypertension Sister    Leukemia Brother      Prior to Admission medications   Medication Sig Start Date End Date Taking? Authorizing Provider  ascorbic acid (VITAMIN C) 500 MG tablet Take 1 tablet (500 mg total) by mouth 2 (two) times daily. 03/21/22  Yes Love, Evlyn Kanner, PA-C  atorvastatin (LIPITOR) 80 MG tablet Take 1 tablet (80 mg total) by mouth daily at 6 PM. 03/21/22  Yes Love, Evlyn Kanner, PA-C  Cholecalciferol (VITAMIN D3) 125 MCG (5000 UT) CAPS Take 1 capsule by mouth daily. 04/17/22  Yes [provider]  clopidogrel (PLAVIX) 75 MG tablet Take 1 tablet (75 mg total) by mouth daily at 6 (six) AM. 09/26/23  Yes Georgiana Spinner, NP  ELIQUIS 5 MG TABS tablet Take 5 mg by mouth 2 (two) times daily. 10/21/23  Yes [provider]  gabapentin (NEURONTIN) 100 MG capsule Take 100 mg by mouth 3 (three) times daily.   Yes [provider]  HUMALOG KWIKPEN 100 UNIT/ML KwikPen Inject 18 Units into the skin 3 (three) times daily. 10/02/23  Yes [provider]  insulin glargine (LANTUS) 100 UNIT/ML Solostar Pen Inject 41 Units into the skin 2 (two) times daily. Patient taking differently: Inject 52 Units into the skin at bedtime. 03/21/22  Yes Love, Evlyn Kanner, PA-C  losartan (COZAAR) 25 MG tablet Take 1 tablet (25 mg total) by mouth daily. 03/21/22  Yes  Love, Evlyn Kanner, PA-C  TRULICITY 1.5 MG/0.5ML SOAJ Inject 1.5 mg into the skin once a week. 11/12/23  Yes [provider]  Zinc Sulfate 220 (50 Zn) MG TABS Take 1 tablet (220 mg total) by mouth daily after supper. 03/21/22  Yes Love, Evlyn Kanner, PA-C  NICORETTE STARTER KIT 4 MG gum Take 4 mg by  mouth as needed for smoking cessation. Patient not taking: Reported on 12/04/2023 02/05/23   [provider]  oxyCODONE-acetaminophen (PERCOCET) 5-325 MG tablet Take 1 tablet by mouth every 4 (four) hours as needed for severe pain (pain score 7-10). Patient not taking: Reported on 12/04/2023 12/02/23   Candelaria Stagers, DPM    Physical Exam: Vitals:   12/04/23 1738 12/04/23 1742 12/04/23 1900  BP: 134/83  114/82  Pulse: 86  85  Resp: 16  20  Temp: 98 F (36.7 C)    TempSrc: Oral    SpO2:   100%  Weight:  105.7 kg   Height:  6\' 4"  (1.93 m)    General: Not in acute distress HEENT:       Eyes: PERRL, EOMI, no jaundice       ENT: No discharge from the ears and nose, no pharynx injection, no tonsillar enlargement.        Neck: No JVD, no bruit, no mass felt. Heme: No neck lymph node enlargement. Cardiac: S1/S2, RRR, No murmurs, No gallops or rubs. Respiratory: No rales, wheezing, rhonchi or rubs. GI: Soft, nondistended, nontender, no rebound pain, no organomegaly, BS present. GU: No hematuria Ext: has 1+ leg edema in right leg. Right lower leg and foot are cool to touch. Has very faint dopplerable pulse in the right DP per EDP. S/p of left BKA Musculoskeletal: No joint deformities, No joint redness or warmth, no limitation of ROM in spin. Skin: has a small open surgical wound in right foot (s/p of 5th toe amputation), no drainage. Neuro: Alert, oriented X3, cranial nerves II-XII grossly intact, moves all extremities  Psych: Patient is not psychotic, no suicidal or hemocidal ideation.  Labs on Admission: I have personally reviewed following labs and imaging studies  CBC: Recent Labs   Lab 12/04/23 1744  WBC 10.7*  HGB 14.0  HCT 41.8  MCV 83.9  PLT 182   Basic Metabolic Panel: Recent Labs  Lab 12/04/23 1732  NA 140  K 3.8  CL 106  CO2 24  GLUCOSE 130*  BUN 13  CREATININE 0.79  CALCIUM 8.9   GFR: Estimated Creatinine Clearance: 118 mL/min (by C-G formula based on SCr of 0.79 mg/dL). Liver Function Tests: Recent Labs  Lab 12/04/23 1732  AST 20  ALT 15  ALKPHOS 65  BILITOT 1.0  PROT 6.7  ALBUMIN 3.2*   No results for input(s): "LIPASE", "AMYLASE" in the last 168 hours. No results for input(s): "AMMONIA" in the last 168 hours. Coagulation Profile: Recent Labs  Lab 12/04/23 1744  INR 1.2   Cardiac Enzymes: No results for input(s): "CKTOTAL", "CKMB", "CKMBINDEX", "TROPONINI" in the last 168 hours. BNP (last 3 results) No results for input(s): "PROBNP" in the last 8760 hours. HbA1C: No results for input(s): "HGBA1C" in the last 72 hours. CBG: No results for input(s): "GLUCAP" in the last 168 hours. Lipid Profile: No results for input(s): "CHOL", "HDL", "LDLCALC", "TRIG", "CHOLHDL", "LDLDIRECT" in the last 72 hours. Thyroid Function Tests: No results for input(s): "TSH", "T4TOTAL", "FREET4", "T3FREE", "THYROIDAB" in the last 72 hours. Anemia Panel: No results for input(s): "VITAMINB12", "FOLATE", "FERRITIN", "TIBC", "IRON", "RETICCTPCT" in the last 72 hours. Urine analysis:    Component Value Date/Time   COLORURINE YELLOW (A) 03/30/2021 0958   APPEARANCEUR CLEAR (A) 03/30/2021 0958   APPEARANCEUR Cloudy (A) 11/20/2020 1611   LABSPEC 1.027 03/30/2021 0958   LABSPEC 1.024 11/12/2013 2001   PHURINE 5.0 03/30/2021 0958   GLUCOSEU >=500 (A) 03/30/2021 1610  GLUCOSEU >=500 11/12/2013 2001   HGBUR NEGATIVE 03/30/2021 0958   BILIRUBINUR NEGATIVE 03/30/2021 0958   BILIRUBINUR Negative 11/20/2020 1611   BILIRUBINUR Negative 11/12/2013 2001   KETONESUR NEGATIVE 03/30/2021 0958   PROTEINUR NEGATIVE 03/30/2021 0958   NITRITE NEGATIVE  03/30/2021 0958   LEUKOCYTESUR NEGATIVE 03/30/2021 0958   LEUKOCYTESUR Negative 11/12/2013 2001   Sepsis Labs: @LABRCNTIP (procalcitonin:4,lacticidven:4) )No results found for this or any previous visit (from the past 240 hours).   Radiological Exams on Admission:   Assessment/Plan Principal Problem:   Critical limb ischemia of right lower extremity (HCC) Active Problems:   Peripheral vascular disease (HCC)   Chronic diastolic CHF (congestive heart failure) (HCC)   Essential hypertension   Hyperlipidemia   Coronary artery disease   Atrial fibrillation, chronic (HCC)   DVT (deep venous thrombosis) (HCC)   Overweight (BMI 25.0-29.9)   Assessment and Plan:   Critical limb ischemia of right lower extremity and hx of PVD:  pt had right lower leg venous Doppler 11/03/2023 which was negative for DVT.  Dr. Gilda Crease of [facility] is consulted by EDP.  -Admitted to telemetry bed as inpatient -IV heparin -Hold Plavix and Eliquis for possible surgery -Aspirin, Lipitor -Pain control: As needed Percocet, morphine, Tylenol  Chronic diastolic CHF (congestive heart failure) (HCC): 2D echo on 11/29/2015 showed EF 55%.  No SOB.  Does not seem to have CHF exacerbation. -Check BNP  Essential hypertension -IV hydralazine as needed -Cozaar  Hyperlipidemia -Lipitor  Coronary artery disease: -Aspirin, Lipitor  Atrial fibrillation, chronic (HCC): Heart rate 85 -Switched Eliquis to IV heparin as above -Telemetry monitoring  History of DVT (deep venous thrombosis) (HCC) -on IV heparin  Overweight (BMI 25.0-29.9): Body weight well 5.7 kg, BMI 28.36 -Encouraged losing weight -Exercise and healthy diet  S/p of recent right small toe amputation: with a small open wound, no drainage. -wound care by Nurse     DVT ppx: on IV Heparin          Code Status: Full code     Family Communication:     not done, no family member is at bed side.   Disposition Plan:  Anticipate discharge back to  previous environment  Consults called:  Dr. Gilda Crease of [facility] is consulted.   Admission status and Level of care: Telemetry Medical:    for obs as inpt        Dispo: The patient is from: Home              Anticipated d/c is to: Home              Anticipated d/c date is: 2 days              Patient currently is not medically stable to d/c.    Severity of Illness:  The appropriate patient status for this patient is INPATIENT. Inpatient status is judged to be reasonable and necessary in order to provide the required intensity of service to ensure the patient's safety. [name] presenting symptoms, physical exam findings, and initial radiographic and laboratory data in the context of their chronic comorbidities is felt to place them at high risk for further clinical deterioration. Furthermore, it is not anticipated that the patient will be medically stable for discharge from the hospital within 2 midnights of admission.   * I certify that at the point of admission it is my clinical judgment that the patient will require inpatient hospital care spanning beyond 2 midnights from the point of admission due  to high intensity of service, high risk for further deterioration and high frequency of surveillance required.*       Date of Service 12/04/2023    Lorretta Harp [facility]   If 7PM-7AM, please contact night-coverage [email or web address] 12/04/2023, 9:22 PM

## 2023-12-04 NOTE — Telephone Encounter (Signed)
 [place] home health nurse with [facility] left a message stating that the patient right leg is cold to touch. Also the patient has swelling and ongoing pain since recent surgery. I spoke with Vivia Birmingham NP and she advise that the patient she be seen at the ED for further evaluation. Gearldine Bienenstock was notified with medical advice and will verbalized to patient.

## 2023-12-04 NOTE — Progress Notes (Addendum)
 PHARMACY - ANTICOAGULATION CONSULT NOTE  Pharmacy Consult for heparin infusion  Indication:   No Known Allergies  Patient Measurements: Height: 6\' 4"  (193 cm) Weight: 105.7 kg (233 lb) IBW/kg (Calculated) : 86.8 Heparin Dosing Weight: arterial occlusion/ischemic limb  Vital Signs: Temp: 98 F (36.7 C) (02/27 1738) Temp Source: Oral (02/27 1738) BP: 134/83 (02/27 1738) Pulse Rate: 86 (02/27 1738)  Labs: Recent Labs    12/04/23 1744  HGB 14.0  HCT 41.8  PLT 182  APTT 36  LABPROT 15.9*  INR 1.2    CrCl cannot be calculated (Patient's most recent lab result is older than the maximum 21 days allowed.).   Medical History: Past Medical History:  Diagnosis Date   Coronary artery disease    Diabetes (HCC)    DVT (deep venous thrombosis) (HCC)    Gangrene of left foot (HCC) 02/09/2018   GERD (gastroesophageal reflux disease)    Hyperlipidemia    Hypertension    Peripheral vascular disease (HCC)     Medications:  PTA: Apixaban 5mg  BID  Assessment: 68 yo male with PMH of BKA, DM, DVT, and recent amputation of right 4th digit. Patient seem in ED with right lower leg pain and swelling. Right leg reported cold to the touch. Pharmacy consulted to start heparin infusion for arterial occlusion/ischemic limb. Patient reported took last apixaban dose this 0600 today (2/27).   Goal of Therapy:  Heparin level 0.3-0.7 units/ml aPTT 66-102 seconds Monitor platelets by anticoagulation protocol: Yes   Plan:  Give Heparin 6000 units bolus x 1  Start heparin infusion at 1600 units/hr Baseline anti-Xa level elevated due to apixaban PTA. Will adjust dose by aPTT levels until aPTT and anti-Xa levels correlate. Check aPTT and anti-Xa level in 6 hours and daily while on heparin. Continue to monitor H&H and platelets  Gardner Candle, PharmD, BCPS Clinical Pharmacist 12/04/2023 6:31 PM

## 2023-12-04 NOTE — ED Triage Notes (Addendum)
 BIBEMS, coming from home. C/o RLE pain and swelling. 1x ago, 4th toe removed. Cold to foot below the knee, home health re-wrapped wound today. On blood thinners. PMH: BKA L, DM, DVTs. VSS GCS 15. BGL: 176

## 2023-12-04 NOTE — H&P (View-Only) (Signed)
 Hospital Consult    Reason for Consult:  Right Lower Extremity Ischemia Requesting Physician:  Dr Minna Antis MD MRN #:  [id number]  History of Present Illness: This is a 68 y.o. male with a past medical history of CAD, diabetes, gastric reflux, hypertension, hyperlipidemia, prior left BKA presents to the emergency department for right lower extremity discomfort and swelling over the last 1 week or so. According to the patient for the last week or so he has been experiencing some intermittent pain in the right leg. States his home health nurses noted some increased swelling to the leg as well so sent him to the emergency department today for evaluation.   On exam this morning the patient was resting comfortably in bed.  I was able to Doppler PT pulses which were weak and strong femoral pulses with a pulse at the end of an incision from his prior bypass.  I was unable to get up popliteal pulse.  Patient endorses his right lower extremity is warmer to touch this morning and feels a little better.  Scaler surgery to evaluate.    Past Medical History:  Diagnosis Date   Coronary artery disease    Diabetes (HCC)    DVT (deep venous thrombosis) (HCC)    Gangrene of left foot (HCC) 02/09/2018   GERD (gastroesophageal reflux disease)    Hyperlipidemia    Hypertension    Peripheral vascular disease (HCC)     Past Surgical History:  Procedure Laterality Date   ABDOMINAL AORTOGRAM W/LOWER EXTREMITY N/A 02/20/2022   Procedure: ABDOMINAL AORTOGRAM W/LOWER EXTREMITY;  Surgeon: Cephus Shelling, MD;  [facility];  Service: Cardiovascular;  Laterality: N/A;   AMPUTATION Left 10/28/2018   Procedure: AMPUTATION BELOW KNEE;  Surgeon: Annice Needy, MD;  Location: [facility];  Service: Vascular;  Laterality: Left;   AMPUTATION Left 09/10/2021   Procedure: AMPUTATION DIGIT;  Surgeon: Allena Napoleon, MD;  Location: [facility];  Service: Plastics;  Laterality: Left;   AMPUTATION Left  09/14/2021   Procedure: AMPUTATION DIGIT left long finger and irrigation and debridement;  Surgeon: Allena Napoleon, MD;  Location: [facility];  Service: Plastics;  Laterality: Left;   AMPUTATION Right 02/27/2022   Procedure: RIGHT PARTIAL AMPUTATION FOOT;  Surgeon: Felecia Shelling, DPM;  Location: [facility];  Service: Podiatry;  Laterality: Right;   AMPUTATION TOE Left 02/10/2018   Procedure: AMPUTATION TOE;  Surgeon: Linus Galas, DPM;  Location: [facility];  Service: Podiatry;  Laterality: Left;   AMPUTATION TOE Right 11/06/2023   Procedure: Right 4th Toe Amputation;  Surgeon: Pilar Plate, DPM;  Location: [facility];  Service: Orthopedics/Podiatry;  Laterality: Right;   APPLICATION OF WOUND VAC Left 10/16/2018   Procedure: APPLICATION OF WOUND VAC;  Surgeon: Gwyneth Revels, DPM;  Location: [facility];  Service: Podiatry;  Laterality: Left;   DORSAL SLIT N/A 01/12/2021   Procedure: DORSAL SLIT;  Surgeon: Sondra Come, MD;  Location: [facility];  Service: Urology;  Laterality: N/A;   FEMORAL-TIBIAL BYPASS GRAFT Right 02/25/2022   Procedure: RIGHT LOWER EXTREMITY FEMORAL TO TIBIAL PERONEAL TRUNK BYPASS;  Surgeon: Cephus Shelling, MD;  Location: [facility];  Service: Vascular;  Laterality: Right;  INSERT ARTERIAL LINE   groin surgery     IRRIGATION AND DEBRIDEMENT FOOT Left 09/30/2018   Procedure: IRRIGATION AND DEBRIDEMENT FOOT;  Surgeon: Linus Galas, DPM;  Location: [facility];  Service: Podiatry;  Laterality: Left;   LOWER EXTREMITY ANGIOGRAPHY Left 02/12/2018   Procedure:  Lower Extremity Angiography;  Surgeon: Annice Needy, MD;  Location: [facility];  Service: Cardiovascular;  Laterality: Left;   LOWER EXTREMITY ANGIOGRAPHY Left 10/01/2018   Procedure: Lower Extremity Angiography;  Surgeon: Annice Needy, MD;  Location: [facility];  Service: Cardiovascular;  Laterality: Left;   LOWER EXTREMITY ANGIOGRAPHY Left 10/19/2018   Procedure: Lower Extremity Angiography;  Surgeon: Annice Needy,  MD;  Location: [facility];  Service: Cardiovascular;  Laterality: Left;   LOWER EXTREMITY ANGIOGRAPHY Left 10/20/2018   Procedure: LOWER EXTREMITY ANGIOGRAPHY;  Surgeon: Annice Needy, MD;  Location: [facility];  Service: Cardiovascular;  Laterality: Left;   LOWER EXTREMITY ANGIOGRAPHY Right 11/25/2018   Procedure: LOWER EXTREMITY ANGIOGRAPHY;  Surgeon: Annice Needy, MD;  Location: [facility];  Service: Cardiovascular;  Laterality: Right;   LOWER EXTREMITY ANGIOGRAPHY Right 01/10/2020   Procedure: LOWER EXTREMITY ANGIOGRAPHY;  Surgeon: Annice Needy, MD;  Location: [facility];  Service: Cardiovascular;  Laterality: Right;   LOWER EXTREMITY ANGIOGRAPHY Right 12/11/2020   Procedure: LOWER EXTREMITY ANGIOGRAPHY;  Surgeon: Annice Needy, MD;  Location: [facility];  Service: Cardiovascular;  Laterality: Right;   LOWER EXTREMITY ANGIOGRAPHY Right 06/26/2023   Procedure: Lower Extremity Angiography;  Surgeon: Renford Dills, MD;  Location: [facility];  Service: Cardiovascular;  Laterality: Right;   LOWER EXTREMITY ANGIOGRAPHY Right 11/05/2023   Procedure: Lower Extremity Angiography;  Surgeon: Annice Needy, MD;  Location: [facility];  Service: Cardiovascular;  Laterality: Right;   LOWER EXTREMITY INTERVENTION  02/12/2018   Procedure: LOWER EXTREMITY INTERVENTION;  Surgeon: Annice Needy, MD;  Location: [facility];  Service: Cardiovascular;;   MINOR IRRIGATION AND DEBRIDEMENT OF WOUND Left 09/10/2021   Procedure: IRRIGATION AND DEBRIDEMENT OF LEFT LONG FINGER WOUND;  Surgeon: Allena Napoleon, MD;  Location: [facility];  Service: Plastics;  Laterality: Left;   NECK SURGERY     PERIPHERAL VASCULAR INTERVENTION  02/20/2022   Procedure: PERIPHERAL VASCULAR INTERVENTION;  Surgeon: Cephus Shelling, MD;  [facility];  Service: Cardiovascular;;  right iliac   TRANSMETATARSAL AMPUTATION Left 10/16/2018   Procedure:  TRANSMETATARSAL AMPUTATION LEFT FOOT;  Surgeon: Gwyneth Revels, DPM;  Location: [facility];  Service: Podiatry;  Laterality: Left;   VEIN HARVEST Right 02/25/2022   Procedure: VEIN HARVEST OF RIGHT GREATER SAPHENOUS VEIN;  Surgeon: Cephus Shelling, MD;  Location: [facility];  Service: Vascular;  Laterality: Right;    No Known Allergies  Prior to Admission medications   Medication Sig Start Date End Date Taking? Authorizing Provider  ascorbic acid (VITAMIN C) 500 MG tablet Take 1 tablet (500 mg total) by mouth 2 (two) times daily. 03/21/22   Love, Evlyn Kanner, PA-C  atorvastatin (LIPITOR) 80 MG tablet Take 1 tablet (80 mg total) by mouth daily at 6 PM. 03/21/22   Love, Evlyn Kanner, PA-C  Cholecalciferol (VITAMIN D3) 125 MCG (5000 UT) CAPS Take 1 capsule by mouth daily. 04/17/22   [provider]  clopidogrel (PLAVIX) 75 MG tablet Take 1 tablet (75 mg total) by mouth daily at 6 (six) AM. 09/26/23   Georgiana Spinner, NP  ELIQUIS 5 MG TABS tablet Take 5 mg by mouth 2 (two) times daily. 10/21/23   [provider]  gabapentin (NEURONTIN) 100 MG capsule Take 100 mg by mouth 3 (three) times daily.    [provider]  insulin glargine (LANTUS)  100 UNIT/ML Solostar Pen Inject 41 Units into the skin 2 (two) times daily. Patient taking differently: Inject 48 Units into the skin daily. 03/21/22   Love, Evlyn Kanner, PA-C  insulin lispro (HUMALOG) 100 UNIT/ML injection Inject 18 Units into the skin 3 (three) times daily before meals.    [provider]  losartan (COZAAR) 25 MG tablet Take 1 tablet (25 mg total) by mouth daily. 03/21/22   Love, Evlyn Kanner, PA-C  NICORETTE STARTER KIT 4 MG gum Take 4 mg by mouth as needed for smoking cessation. 02/05/23   [provider]  oxyCODONE-acetaminophen (PERCOCET) 5-325 MG tablet Take 1 tablet by mouth every 4 (four) hours as needed for severe pain (pain score 7-10). 12/02/23   Candelaria Stagers, DPM  TRULICITY 0.75 MG/0.5ML SOPN Inject 1.5 mg into the  skin once a week. 03/22/22   [provider]  Zinc Sulfate 220 (50 Zn) MG TABS Take 1 tablet (220 mg total) by mouth daily after supper. 03/21/22   Love, Evlyn Kanner, PA-C    Social History   Socioeconomic History   Marital status: Divorced    Spouse name: Not on file   Number of children: Not on file   Years of education: Not on file   Highest education level: Not on file  Occupational History   Not on file  Tobacco Use   Smoking status: Former    Current packs/day: 0.00    Average packs/day: 1 pack/day for 47.0 years (47.0 ttl pk-yrs)    Types: Cigarettes    Start date: 01/1974    Quit date: 01/2021    Years since quitting: 2.9   Smokeless tobacco: Never  Vaping Use   Vaping status: Never Used  Substance and Sexual Activity   Alcohol use: No   Drug use: No   Sexual activity: Not Currently  Other Topics Concern   Not on file  Social History Narrative   Not on file   Social Drivers of Health   Financial Resource Strain: Not on file  Food Insecurity: No Food Insecurity (11/04/2023)   Hunger Vital Sign    Worried About Running Out of Food in the Last Year: Never true    Ran Out of Food in the Last Year: Never true  Transportation Needs: No Transportation Needs (11/04/2023)   PRAPARE - Administrator, Civil Service (Medical): No    Lack of Transportation (Non-Medical): No  Physical Activity: Not on file  Stress: Not on file  Social Connections: Moderately Isolated (11/04/2023)   Social Connection and Isolation Panel [NHANES]    Frequency of Communication with Friends and Family: More than three times a week    Frequency of Social Gatherings with Friends and Family: More than three times a week    Attends Religious Services: 1 to 4 times per year    Active Member of [employer] or Organizations: No    Attends Banker Meetings: Never    Marital Status: Divorced  Catering manager Violence: Not At Risk (11/04/2023)   Humiliation, Afraid, Rape, and  Kick questionnaire    Fear of Current or Ex-Partner: No    Emotionally Abused: No    Physically Abused: No    Sexually Abused: No     Family History  Problem Relation Age of Onset   Diabetes Mother    Coronary artery disease Father    Hypertension Sister    Leukemia Brother     ROS: Otherwise negative unless mentioned in  HPI  Physical Examination  Vitals:   12/04/23 1738  BP: 134/83  Pulse: 86  Resp: 16  Temp: 98 F (36.7 C)   Body mass index is 28.36 kg/m.  General:  WDWN in NAD Gait: Not observed HENT: WNL, normocephalic Pulmonary: normal non-labored breathing, without Rales, rhonchi,  wheezing Cardiac: regular, without  Murmurs, rubs or gallops; without carotid bruits Abdomen: Positive bowel sounds throughout, soft, NT/ND, no masses Skin: without rashes Vascular Exam/Pulses: Doppler pulses are present but weak in the right PD, strong Doppler pulses the femoral artery, no popliteal Doppler pulse available. Extremities: with ischemic changes, without Gangrene , without cellulitis; with open wounds;  Musculoskeletal: no muscle wasting or atrophy  Neurologic: A&O X 3;  No focal weakness or paresthesias are detected; speech is fluent/normal Psychiatric:  The pt has Normal affect. Lymph:  Unremarkable  CBC    Component Value Date/Time   WBC 10.7 (H) 12/04/2023 1744   RBC 4.98 12/04/2023 1744   HGB 14.0 12/04/2023 1744   HGB 14.1 11/17/2013 0621   HCT 41.8 12/04/2023 1744   HCT 41.0 02/16/2022 0608   PLT 182 12/04/2023 1744   PLT 218 11/17/2013 0621   MCV 83.9 12/04/2023 1744   MCV 86 11/17/2013 0621   MCH 28.1 12/04/2023 1744   MCHC 33.5 12/04/2023 1744   RDW 15.2 12/04/2023 1744   RDW 13.6 11/17/2013 0621   LYMPHSABS 2.1 11/03/2023 1213   LYMPHSABS 2.4 11/17/2013 0621   MONOABS 1.1 (H) 11/03/2023 1213   MONOABS 0.9 11/17/2013 0621   EOSABS 0.0 11/03/2023 1213   EOSABS 0.1 11/17/2013 0621   BASOSABS 0.0 11/03/2023 1213   BASOSABS 0.0 11/17/2013 0621     BMET    Component Value Date/Time   NA 140 11/07/2023 0503   NA 135 (L) 11/17/2013 0621   K 4.4 11/07/2023 0503   K 4.3 11/17/2013 0621   CL 105 11/07/2023 0503   CL 99 11/17/2013 0621   CO2 26 11/07/2023 0503   CO2 31 11/17/2013 0621   GLUCOSE 175 (H) 11/07/2023 0503   GLUCOSE 172 (H) 11/17/2013 0621   BUN 17 11/07/2023 0503   BUN 10 11/17/2013 0621   CREATININE 0.80 11/07/2023 0503   CREATININE 1.05 04/18/2022 1034   CALCIUM 8.7 (L) 11/07/2023 0503   CALCIUM 9.6 11/17/2013 0621   GFRNONAA >60 11/07/2023 0503   GFRNONAA >60 11/17/2013 0621   GFRAA >60 01/10/2020 0808   GFRAA >60 11/17/2013 0621    COAGS: Lab Results  Component Value Date   INR 1.2 06/25/2023   INR 1.3 (H) 02/15/2022   INR 1.2 03/31/2021     Non-Invasive Vascular Imaging:   Xray of right foot ordered.   Statin:  Yes.   Beta Blocker:  No. Aspirin:  No. ACEI:  No. ARB:  Yes.   CCB use:  No Other antiplatelets/anticoagulants:  Yes.   Eliquis 5 mg BID, Plavix 75 mg Daily   ASSESSMENT/PLAN: This is a 68 y.o. male who presents to [facility] emergency department with worsening pain over the past week to his right lower extremity.  He also endorsed that his right lower extremity was cold.  Doppler pulses even though they were weak were attainable at the posttibial.  Strong Doppler pulses in the right femoral artery.  Unable to obtain a Doppler pulse in the right popliteal artery  Vascular surgery plans on taking the patient to the vascular lab today on 12/05/2023 for right lower extremity angiogram with possible intervention.  I had  a long detailed discussion with the patient at the bedside this morning concerning the procedure, benefits, risk, and complications.  He verbalizes understanding wishes to proceed as soon as possible.  I answered all his questions this morning.  Patient endorses he has been n.p.o. since midnight last night.   I discussed the plan in detail with Dr. [name] MD and he  agrees with the plan.   Marcie [facility] 12/04/2023 6:17 PM

## 2023-12-04 NOTE — ED Provider Notes (Signed)
 [facility] Provider Note    Event Date/Time   First MD Initiated Contact with Patient 12/04/23 1738     (approximate)  History   Chief Complaint: Foot Swelling  HPI  Austin [name] is a 68 y.o. male with a past medical history of CAD, diabetes, gastric reflux, hypertension, hyperlipidemia, prior left BKA presents to the emergency department for right lower extremity discomfort and swelling over the last 1 week or so.  According to the patient for the last week or so he has been experiencing some intermittent pain in the right leg.  States his home health nurses noted some increased swelling to the leg as well so sent him to the emergency department today for evaluation.  Patient denies any chest pain or shortness of breath.  No fever.  Physical Exam   Triage Vital Signs: ED Triage Vitals  Encounter Vitals Group     BP 12/04/23 1738 134/83     Systolic BP Percentile --      Diastolic BP Percentile --      Pulse Rate 12/04/23 1738 86     Resp 12/04/23 1738 16     Temp 12/04/23 1738 98 F (36.7 C)     Temp Source 12/04/23 1738 Oral     SpO2 --      Weight 12/04/23 1742 233 lb (105.7 kg)     Height 12/04/23 1742 6\' 4"  (1.93 m)     Head Circumference --      Peak Flow --      Pain Score 12/04/23 1743 8     Pain Loc --      Pain Education --      Exclude from Growth Chart --     Most recent vital signs: Vitals:   12/04/23 1738  BP: 134/83  Pulse: 86  Resp: 16  Temp: 98 F (36.7 C)    General: Awake, no distress.  CV:  Good peripheral perfusion. Resp:  Normal effort.   Abd:  No distention.   Other:  No palpable pulse in the right lower extremity, somewhat cool to the touch.  We were able to Doppler an extremely faint DP pulse only.  Status post left BKA.   ED Results / Procedures / Treatments   MEDICATIONS ORDERED IN ED: Medications - No data to display   IMPRESSION / MDM / ASSESSMENT AND PLAN / ED COURSE  I reviewed the triage  vital signs and the nursing notes.  Patient's presentation is most consistent with acute presentation with potential threat to life or bodily function.  Patient presents to the emergency department for increased discomfort and swelling of the right lower extremity.  Patient has a history of a prior left BKA he estimates approximately 5 years ago.  Right lower extremity is cool to the touch however there is a very faint dopplerable pulse in the right DP.  I spoke to Dr. Gilda Crease of vascular surgery who recommends starting the patient on heparin and admitting to the hospital service for likely angiogram tomorrow.  Patient agreeable plan of care.  Patient's workup is reassuring with a normal CBC, reassuring chemistry.  Will admit to the hospital service for further workup and treatment.  Vascular surgery will see tomorrow.   CRITICAL CARE Performed by: Minna Antis   Total critical care time: 30 minutes  Critical care time was exclusive of separately billable procedures and treating other patients.  Critical care was necessary to treat or prevent imminent or life-threatening  deterioration.  Critical care was time spent personally by me on the following activities: development of treatment plan with patient and/or surrogate as well as nursing, discussions with consultants, evaluation of patient's response to treatment, examination of patient, obtaining history from patient or surrogate, ordering and performing treatments and interventions, ordering and review of laboratory studies, ordering and review of radiographic studies, pulse oximetry and re-evaluation of patient's condition.   FINAL CLINICAL IMPRESSION(S) / ED DIAGNOSES   Ischemic limb Arterial occlusion   Note:  This document was prepared using Dragon voice recognition software and may include unintentional dictation errors.   Minna Antis, MD 12/04/23 2005

## 2023-12-05 ENCOUNTER — Encounter: Payer: Self-pay | Admitting: Internal Medicine

## 2023-12-05 ENCOUNTER — Encounter
Admission: EM | Disposition: A | Discharge status: Home / Self Care | Payer: Self-pay | Source: Home / Self Care | Attending: Internal Medicine

## 2023-12-05 DIAGNOSIS — I70221 Atherosclerosis of native arteries of extremities with rest pain, right leg: Secondary | ICD-10-CM | POA: Diagnosis not present

## 2023-12-05 DIAGNOSIS — I700 Atherosclerosis of aorta: Secondary | ICD-10-CM

## 2023-12-05 DIAGNOSIS — Z9889 Other specified postprocedural states: Secondary | ICD-10-CM

## 2023-12-05 DIAGNOSIS — I7090 Unspecified atherosclerosis: Principal | ICD-10-CM

## 2023-12-05 DIAGNOSIS — I70421 Atherosclerosis of autologous vein bypass graft(s) of the extremities with rest pain, right leg: Secondary | ICD-10-CM | POA: Diagnosis not present

## 2023-12-05 HISTORY — PX: LOWER EXTREMITY ANGIOGRAPHY: CATH118251

## 2023-12-05 LAB — GLUCOSE, CAPILLARY
Glucose-Capillary: 110 mg/dL — ABNORMAL HIGH (ref 70–99)
Glucose-Capillary: 100 mg/dL — ABNORMAL HIGH (ref 70–99)
Glucose-Capillary: 78 mg/dL (ref 70–99)
Glucose-Capillary: 147 mg/dL — ABNORMAL HIGH (ref 70–99)

## 2023-12-05 LAB — BASIC METABOLIC PANEL
Anion gap: 5 (ref 5–15)
BUN: 13 mg/dL (ref 8–23)
CO2: 27 mmol/L (ref 22–32)
Calcium: 9.4 mg/dL (ref 8.9–10.3)
Chloride: 107 mmol/L (ref 98–111)
Creatinine, Ser: 0.81 mg/dL (ref 0.61–1.24)
GFR, Estimated: >60 mL/min (ref >60)
Glucose, Bld: 146 mg/dL — ABNORMAL HIGH (ref 70–99)
Potassium: 4 mmol/L (ref 3.5–5.1)
Sodium: 139 mmol/L (ref 135–145)

## 2023-12-05 LAB — CBC
HCT: 43.6 % (ref 39.0–52.0)
Hemoglobin: 14.6 g/dL (ref 13.0–17.0)
MCH: 27.5 pg (ref 26.0–34.0)
MCHC: 33.5 g/dL (ref 30.0–36.0)
MCV: 82.1 fL (ref 80.0–100.0)
Platelets: 200 10*3/uL (ref 150–400)
RBC: 5.31 MIL/uL (ref 4.22–5.81)
RDW: 15.2 % (ref 11.5–15.5)
WBC: 12.3 10*3/uL — ABNORMAL HIGH (ref 4.0–10.5)
nRBC: 0 % (ref 0.0–0.2)

## 2023-12-05 LAB — APTT
aPTT: 155 s — ABNORMAL HIGH (ref 24–36)
aPTT: 78 s — ABNORMAL HIGH (ref 24–36)
aPTT: 66 s — ABNORMAL HIGH (ref 24–36)

## 2023-12-05 LAB — HEPARIN LEVEL (UNFRACTIONATED): Heparin Unfractionated: >1.1 [IU]/mL — ABNORMAL HIGH (ref 0.30–0.70)

## 2023-12-05 SURGERY — LOWER EXTREMITY ANGIOGRAPHY
Anesthesia: Moderate Sedation | Laterality: Right

## 2023-12-05 MED ORDER — CEFAZOLIN SODIUM-DEXTROSE 2-4 GM/100ML-% IV SOLN
2.0000 g | INTRAVENOUS | Status: AC
  Administered 2023-12-05: 2 g via INTRAVENOUS

## 2023-12-05 MED ORDER — MIDAZOLAM HCL 2 MG/ML PO SYRP: 8.0000 mg | ORAL_SOLUTION | Freq: Once | ORAL | Status: DC | PRN

## 2023-12-05 MED ORDER — MIDAZOLAM HCL 2 MG/2ML IJ SOLN
INTRAMUSCULAR | Status: AC
  Filled 2023-12-05: qty 2

## 2023-12-05 MED ORDER — HEPARIN (PORCINE) 25000 UT/250ML-% IV SOLN
1250.0000 [IU]/h | INTRAVENOUS | Status: DC
  Administered 2023-12-05: 1250 [IU]/h via INTRAVENOUS
  Filled 2023-12-05: qty 250

## 2023-12-05 MED ORDER — METHYLPREDNISOLONE SODIUM SUCC 125 MG IJ SOLR: 125.0000 mg | Freq: Once | INTRAMUSCULAR | Status: DC | PRN

## 2023-12-05 MED ORDER — HEPARIN (PORCINE) 25000 UT/250ML-% IV SOLN: 1250.0000 [IU]/h | INTRAVENOUS | Status: DC

## 2023-12-05 MED ORDER — FENTANYL CITRATE PF 50 MCG/ML IJ SOSY
PREFILLED_SYRINGE | INTRAMUSCULAR | Status: AC
  Filled 2023-12-05: qty 1

## 2023-12-05 MED ORDER — LIDOCAINE HCL (PF) 1 % IJ SOLN
INTRAMUSCULAR | Status: DC | PRN
  Administered 2023-12-05: 10 mL via INTRADERMAL

## 2023-12-05 MED ORDER — DIPHENHYDRAMINE HCL 50 MG/ML IJ SOLN: 50.0000 mg | Freq: Once | INTRAMUSCULAR | Status: DC | PRN

## 2023-12-05 MED ORDER — FENTANYL CITRATE (PF) 100 MCG/2ML IJ SOLN
INTRAMUSCULAR | Status: DC | PRN
  Administered 2023-12-05: 25 ug via INTRAVENOUS
  Administered 2023-12-05: 50 ug via INTRAVENOUS

## 2023-12-05 MED ORDER — SODIUM CHLORIDE 0.9 % IV SOLN: INTRAVENOUS | Status: AC

## 2023-12-05 MED ORDER — SODIUM CHLORIDE 0.9% FLUSH: 3.0000 mL | INTRAVENOUS | Status: DC | PRN

## 2023-12-05 MED ORDER — HEPARIN (PORCINE) 25000 UT/250ML-% IV SOLN
1250.0000 [IU]/h | INTRAVENOUS | Status: DC
  Administered 2023-12-05 – 2023-12-06 (×2): 1250 [IU]/h via INTRAVENOUS
  Filled 2023-12-05: qty 250

## 2023-12-05 MED ORDER — FAMOTIDINE 20 MG PO TABS: 40.0000 mg | ORAL_TABLET | Freq: Once | ORAL | Status: DC | PRN

## 2023-12-05 MED ORDER — SODIUM CHLORIDE 0.9 % IV SOLN: INTRAVENOUS | Status: DC

## 2023-12-05 MED ORDER — HEPARIN SODIUM (PORCINE) 1000 UNIT/ML IJ SOLN
INTRAMUSCULAR | Status: DC | PRN
  Administered 2023-12-05: 6000 [IU] via INTRAVENOUS

## 2023-12-05 MED ORDER — CEFAZOLIN SODIUM-DEXTROSE 2-4 GM/100ML-% IV SOLN
INTRAVENOUS | Status: AC
  Filled 2023-12-05: qty 100

## 2023-12-05 MED ORDER — FENTANYL CITRATE PF 50 MCG/ML IJ SOSY: 12.5000 ug | PREFILLED_SYRINGE | Freq: Once | INTRAMUSCULAR | Status: DC | PRN

## 2023-12-05 MED ORDER — IODIXANOL 320 MG/ML IV SOLN
INTRAVENOUS | Status: DC | PRN
  Administered 2023-12-05: 90 mL via INTRA_ARTERIAL

## 2023-12-05 MED ORDER — ONDANSETRON HCL 4 MG/2ML IJ SOLN
4.0000 mg | Freq: Four times a day (QID) | INTRAMUSCULAR | Status: DC | PRN
Start: 2023-12-05 — End: 2023-12-05

## 2023-12-05 MED ORDER — HEPARIN (PORCINE) IN NACL 1000-0.9 UT/500ML-% IV SOLN
INTRAVENOUS | Status: DC | PRN
  Administered 2023-12-05: 1000 mL

## 2023-12-05 MED ORDER — SODIUM CHLORIDE 0.9% FLUSH: 3.0000 mL | Freq: Two times a day (BID) | INTRAVENOUS | Status: DC

## 2023-12-05 MED ORDER — MIDAZOLAM HCL 2 MG/2ML IJ SOLN
INTRAMUSCULAR | Status: DC | PRN
  Administered 2023-12-05: 2 mg via INTRAVENOUS
  Administered 2023-12-05: 1 mg via INTRAVENOUS

## 2023-12-05 MED ORDER — SODIUM CHLORIDE 0.9 % IV SOLN: 250.0000 mL | INTRAVENOUS | Status: DC | PRN

## 2023-12-05 MED ORDER — HEPARIN SODIUM (PORCINE) 1000 UNIT/ML IJ SOLN
INTRAMUSCULAR | Status: AC
  Filled 2023-12-05: qty 10

## 2023-12-05 SURGICAL SUPPLY — 27 items
BALLN LUTONIX 018 4X100X130 (BALLOONS) ×1 IMPLANT
BALLN LUTONIX DCB 4X40X130 (BALLOONS) ×1 IMPLANT
BALLOON LUTONIX 018 4X100X130 (BALLOONS) IMPLANT
BALLOON LUTONIX DCB 4X40X130 (BALLOONS) IMPLANT
CATH ANGIO 5F PIGTAIL 65CM (CATHETERS) IMPLANT
CATH BEACON 5 .035 65 KMP TIP (CATHETERS) IMPLANT
CATH CXI SUPP ST 4FR 135CM (CATHETERS) IMPLANT
DEVICE PRESTO INFLATION (MISCELLANEOUS) IMPLANT
DEVICE STARCLOSE SE CLOSURE (Vascular Products) IMPLANT
GLIDEWIRE ADV .035X260CM (WIRE) IMPLANT
GOWN STRL REUS W/ TWL LRG LVL3 (GOWN DISPOSABLE) ×1 IMPLANT
GUIDEWIRE PFTE-COATED .018X300 (WIRE) IMPLANT
GUIDEWIRE SUPER STIFF .035X180 (WIRE) IMPLANT
NDL ENTRY 21GA 7CM ECHOTIP (NEEDLE) IMPLANT
NEEDLE ENTRY 21GA 7CM ECHOTIP (NEEDLE) ×1 IMPLANT
PACK ANGIOGRAPHY (CUSTOM PROCEDURE TRAY) ×1 IMPLANT
PAD SORBX EP SHIELD 16.5X12 (MISCELLANEOUS) IMPLANT
SET INTRO CAPELLA COAXIAL (SET/KITS/TRAYS/PACK) IMPLANT
SHEATH ANL2 6FRX45 HC (SHEATH) IMPLANT
SHEATH BRITE TIP 5FRX11 (SHEATH) IMPLANT
STENT LIFESTENT 5F 5X40X135 (Permanent Stent) IMPLANT
STENT ONYX FRONTIER 3.0X34 (Permanent Stent) IMPLANT
STENT ONYX FRONTIER 4.0X15 (Permanent Stent) IMPLANT
SYR MEDRAD MARK 7 150ML (SYRINGE) IMPLANT
TUBING CONTRAST HIGH PRESS 72 (TUBING) IMPLANT
WIRE J 3MM .035X145CM (WIRE) IMPLANT
WIRE RUNTHROUGH .014X300CM (WIRE) IMPLANT

## 2023-12-05 NOTE — Plan of Care (Signed)
   Problem: Education: Goal: Knowledge of General Education information will improve Description: Including pain rating scale, medication(s)/side effects and non-pharmacologic comfort measures Outcome: Progressing   Problem: Clinical Measurements: Goal: Ability to maintain clinical measurements within normal limits will improve Outcome: Progressing   Problem: Activity: Goal: Risk for activity intolerance will decrease Outcome: Progressing   Problem: Coping: Goal: Level of anxiety will decrease Outcome: Progressing   Problem: Pain Managment: Goal: General experience of comfort will improve and/or be controlled Outcome: Progressing

## 2023-12-05 NOTE — Plan of Care (Signed)
  Problem: Education: Goal: Knowledge of General Education information will improve Description: Including pain rating scale, medication(s)/side effects and non-pharmacologic comfort measures Outcome: Progressing   Problem: Health Behavior/Discharge Planning: Goal: Ability to manage health-related needs will improve Outcome: Progressing   Problem: Activity: Goal: Risk for activity intolerance will decrease Outcome: Progressing   Problem: Nutrition: Goal: Adequate nutrition will be maintained Outcome: Progressing   Problem: Elimination: Goal: Will not experience complications related to bowel motility Outcome: Progressing   Problem: Pain Managment: Goal: General experience of comfort will improve and/or be controlled Outcome: Progressing   Problem: Safety: Goal: Ability to remain free from injury will improve Outcome: Progressing   Problem: Skin Integrity: Goal: Risk for impaired skin integrity will decrease Outcome: Progressing

## 2023-12-05 NOTE — Plan of Care (Signed)
   Problem: Clinical Measurements: Goal: Ability to maintain clinical measurements within normal limits will improve Outcome: Progressing   Problem: Activity: Goal: Risk for activity intolerance will decrease Outcome: Progressing   Problem: Nutrition: Goal: Adequate nutrition will be maintained Outcome: Progressing

## 2023-12-05 NOTE — Progress Notes (Signed)
   12/04/23 2122  Vitals  Temp 98.3 F (36.8 C)  Temp Source Oral  BP (!) 141/77  MAP (mmHg) 96  BP Location Left Arm  BP Method Automatic  Patient Position (if appropriate) Lying  Pulse Rate 65  Resp 16  Level of Consciousness  Level of Consciousness Alert  MEWS COLOR  MEWS Score Color Green  Oxygen Therapy  SpO2 98 %  O2 Device Room Air  MEWS Score  MEWS Temp 0  MEWS Systolic 0  MEWS Pulse 0  MEWS RR 0  MEWS LOC 0  MEWS Score 0   Patient admitted from ED via stretcher alert on room air.  Heparin gtt handoff completed, vitals stable, and safety precautions in place.  Call bell within reach and plan of care continues.

## 2023-12-05 NOTE — Progress Notes (Signed)
 PHARMACY - ANTICOAGULATION CONSULT NOTE  Pharmacy Consult for heparin infusion  Indication: arterial occlusion/ischemic limb  No Known Allergies  Patient Measurements: Height: 6\' 4"  (193 cm) Weight: 101.3 kg (223 lb 5.2 oz) IBW/kg (Calculated) : 86.8 Heparin Dosing Weight: arterial occlusion/ischemic limb  Vital Signs: Temp: 97.4 F (36.3 C) (02/28 2000) Temp Source: Oral (02/28 2000) BP: 121/71 (02/28 2000) Pulse Rate: 65 (02/28 2000)  Labs: Recent Labs    12/04/23 1732 12/04/23 1744 12/04/23 1744 12/05/23 0048 12/05/23 0443 12/05/23 0958 12/05/23 2135  HGB  --  14.0  --   --  14.6  --   --   HCT  --  41.8  --   --  43.6  --   --   PLT  --  182  --   --  200  --   --   APTT  --  36   < > 155*  --  78* 66*  LABPROT  --  15.9*  --   --   --   --   --   INR  --  1.2  --   --   --   --   --   HEPARINUNFRC  --  >1.10*  --   --   --  >1.10*  --   CREATININE 0.79  --   --   --  0.81  --   --    < > = values in this interval not displayed.    Estimated Creatinine Clearance: 107.2 mL/min (by C-G formula based on SCr of 0.81 mg/dL).   Medical History: Past Medical History:  Diagnosis Date   Coronary artery disease    Diabetes (HCC)    DVT (deep venous thrombosis) (HCC)    Gangrene of left foot (HCC) 02/09/2018   GERD (gastroesophageal reflux disease)    Hyperlipidemia    Hypertension    Peripheral vascular disease (HCC)     Medications:  PTA: Apixaban 5mg  BID  Assessment: 68 yo male with PMH of BKA, DM, DVT, and recent amputation of right 4th digit. Patient seem in ED with right lower leg pain and swelling. Right leg reported cold to the touch. Pharmacy consulted to start heparin infusion for arterial occlusion/ischemic limb. Patient reported took last apixaban dose at 0600 on 2/27.   Goal of Therapy:  Heparin level 0.3-0.7 units/ml aPTT 66-102 seconds Monitor platelets by anticoagulation protocol: Yes  Dose Changes: 2/27 Infusion started at 1600u/hr 2/28  aPTT =155, infusion reduced to 1250 units/hr   Plan:  2/28:  aPTT @ 2135 = 66, therapeutic X 2 - will continue this pt on current rate and recheck HL and aPTT on 3/01 with AM labs.   Baseline anti-Xa level elevated due to apixaban PTA. Will adjust dose by aPTT levels until aPTT and anti-Xa levels correlate. Check anti-Xa level daily while on heparin. Continue to monitor H&H and platelets  Bjorn Hallas D, PharmD Clinical Pharmacist 12/05/2023 10:26 PM

## 2023-12-05 NOTE — Hospital Course (Addendum)
 28FA with h/o PVD (s/p L BKA and R common femoral to TP trunk bypass with vein on 02/25/2022), HTN, HLD, DM, CAD, chronic diastolic CHF, DVT/A fib on Eliquis, and recent R 4th toe amputation who presented on 2/27 with RLE pain.  He was found to have critical limb ischemia and vascular surgery is consulted.  Angiogram scheduled for 2/28.

## 2023-12-05 NOTE — Progress Notes (Signed)
 PHARMACY - ANTICOAGULATION CONSULT NOTE  Pharmacy Consult for heparin infusion  Indication:   No Known Allergies  Patient Measurements: Height: 6\' 4"  (193 cm) Weight: 105.7 kg (233 lb) IBW/kg (Calculated) : 86.8 Heparin Dosing Weight: arterial occlusion/ischemic limb  Vital Signs: Temp: 97.5 F (36.4 C) (02/27 2308) Temp Source: Oral (02/27 2308) BP: 118/72 (02/27 2308) Pulse Rate: 91 (02/27 2308)  Labs: Recent Labs    12/04/23 1732 12/04/23 1744 12/05/23 0048  HGB  --  14.0  --   HCT  --  41.8  --   PLT  --  182  --   APTT  --  36 155*  LABPROT  --  15.9*  --   INR  --  1.2  --   HEPARINUNFRC  --  >1.10*  --   CREATININE 0.79  --   --     Estimated Creatinine Clearance: 118 mL/min (by C-G formula based on SCr of 0.79 mg/dL).   Medical History: Past Medical History:  Diagnosis Date   Coronary artery disease    Diabetes (HCC)    DVT (deep venous thrombosis) (HCC)    Gangrene of left foot (HCC) 02/09/2018   GERD (gastroesophageal reflux disease)    Hyperlipidemia    Hypertension    Peripheral vascular disease (HCC)     Medications:  PTA: Apixaban 5mg  BID  Assessment: 68 yo male with PMH of BKA, DM, DVT, and recent amputation of right 4th digit. Patient seem in ED with right lower leg pain and swelling. Right leg reported cold to the touch. Pharmacy consulted to start heparin infusion for arterial occlusion/ischemic limb. Patient reported took last apixaban dose this 0600 today (2/27).   Goal of Therapy:  Heparin level 0.3-0.7 units/ml aPTT 66-102 seconds Monitor platelets by anticoagulation protocol: Yes   Plan:  2/28:  aPTT @ 0048 = 155, elevated - Will hold heparin drip for 1 hr and restart at 1250 units/hr - Will recheck aPTT and HL 6 hrs after restart  Baseline anti-Xa level elevated due to apixaban PTA. Will adjust dose by aPTT levels until aPTT and anti-Xa levels correlate. Check aPTT and anti-Xa level in 6 hours and daily while on heparin.  Continue to monitor H&H and platelets  Murray Guzzetta D, PharmD Clinical Pharmacist 12/05/2023 1:30 AM

## 2023-12-05 NOTE — Op Note (Signed)
 [facility]  Percutaneous Study/Intervention Procedural Note   Date of Surgery: 12/05/2023  Surgeon:  Renford Dills, MD.  Pre-operative Diagnosis: Critical ischemia of the  Post-operative diagnosis:  Same  Procedure(s) Performed:             1.  Introduction catheter into right lower extremity 3rd order catheter placement with additional third order             2.    Contrast injection right lower extremity for distal runoff             3.  Percutaneous transluminal angioplasty and stent placement right profunda femoris artery             4.  Percutaneous transluminal angioplasty and stent placement of the right peroneal artery.              5.  Percutaneous transluminal angioplasty of the vein bypass proximally.                6.  Star close closure left common femoral arteriotomy  Anesthesia: Conscious sedation was administered under my direct supervision by the interventional radiology RN. IV Versed plus fentanyl were utilized. Continuous ECG, pulse oximetry and blood pressure was monitored throughout the entire procedure.  Conscious sedation was for a total of 85 minutes.  Sheath: 6 [place] Ansell left common femoral retrograde  Contrast: 90 cc  Fluoroscopy Time: 13.2 minutes  Indications:  Austin [name] presents with ischemic changes of the right lower extremity.  He has had numerous vascular procedures on his right lower extremity and attempts at salvaging his right limb.  Recently he underwent extensive revascularization but presented to the emergency room with several hours of increasing pain.  Physical examination as well as noninvasive studies suggest that his bypass is down again and that his right leg is threatened.  Angiography with the hope for reconstruction and limb salvage is recommended.  The risks and benefits are reviewed all questions answered patient agrees to proceed.  Procedure:  Austin [name] is a 68 y.o. y.o. male who was  identified and appropriate procedural time out was performed.  The patient was then placed supine on the table and prepped and draped in the usual sterile fashion.    Ultrasound was placed in the sterile sleeve and the left groin was evaluated the left common femoral artery was echolucent and pulsatile indicating patency.  Image was recorded for the permanent record and under real-time visualization a microneedle was inserted into the common femoral artery microwire followed by a micro-sheath.  A J-wire was then advanced through the micro-sheath and a  5 [place] sheath was then inserted over a J-wire. J-wire was then advanced and a 5 French pigtail catheter was positioned at the level of T12. AP projection of the aorta was then obtained. Pigtail catheter was repositioned to above the bifurcation and a oblique view of the pelvis was obtained.  Subsequently a pigtail catheter with the stiff angle Glidewire was used to cross the aortic bifurcation the catheter wire were advanced down into the right distal external iliac artery. Oblique view of the femoral bifurcation was then obtained and subsequently the wire was reintroduced and the pigtail catheter negotiated into the profunda femoris representing third order catheter placement. Distal runoff was then performed.  6000 units of heparin was then given and allowed to circulate and a 6 [place] Ansell sheath was advanced up and over the bifurcation and positioned in the femoral  artery  KMP  catheter and stiff angle Glidewire were then negotiated down into the distal profunda femoris.  Magnified imaging of the greater than 90% stenosis in the main trunk of the profunda femoris is then obtained.  This is in the mid to distal portion of the profunda femoris and would not be readily surgically available.  The profunda femoris also has extensive distal collaterals indicating it is a majorly significant source of distal flow.  I selected a 5 mm x 40 mm life stent and  deployed this across the critical stenosis.  It was then postdilated with a 4 mm x 40 mm [name] drug-eluting balloon inflated to 10 atm for 1 minute.  Follow-up imaging demonstrated less than 5% residual stenosis.  The wire was pulled back into the common femoral and the Kumpe catheter reintroduced.  The wire and Kumpe catheter were then negotiated into the vein bypass which appears to occlude approximately 10 cm distal to its origin.  Of significance within the segment that is still visualized there are tandem lesions of greater than 90%.  The Kumpe catheter is negotiated distally and injection within the bypass from its proximal one third demonstrates that the vein itself is still patent with the above noted to critical lesions.  The anastomosis distally appears patent.  Distal to the bypass the peroneal artery appears to be a subtotal occlusion in its proximal portion and this is essentially the only outflow and therefore appears to compromise flow throughout the bypass graft.  Distally the peroneal artery appears to be of good caliber 3 mm and collateralizes to the foot quite well.  Using the Kumpe catheter and wire I was able to negotiate the length of the bypass and then across the perineal lesion.  Hand-injection of contrast within the peroneal demonstrated the distal outflow.  Wire was reintroduced.  I then addressed the proximal lesions within the bypass.  A 4 mm x 100 mm [name] drug-eluting balloon wound was advanced across these 2 lesions it was inflated to 10 atm for 2 full minutes.  Follow-up imaging demonstrated less than 10% residual stenosis and the detector was repositioned down to the peroneal lesion.  Given that this area had recently been treated and appears to have been a major factor in the current situation I felt that balloon angioplasty alone would not be adequate as a reasonably long-term treatment and therefore I selected a 3 mm x 30 mm coronary stent and deployed this into the  peroneal.  Deployment was to 10 to 12 atm for 30 seconds.  I needed to extend this and a 4 mm x 15 mm coronary stent was then deployed more proximally overlapping the initial stent.  Again deployment was to 12 atm for 30 seconds.  Follow-up imaging now demonstrated rapid flow of contrast all the way through the bypass the distal anastomosis remains widely patent and the peroneal is now widely patent with brisk runoff to the foot.  After review of these images the sheath is pulled into the left external iliac oblique of the common femoral is obtained and a Star close device deployed. There no immediate complications.   Findings:  The abdominal aorta is opacified with a bolus injection contrast. The aorta itself has diffuse disease but no hemodynamically significant lesions. The common and external iliac arteries are widely patent bilaterally.  The right common femoral is widely patent but the profunda femoris has a greater than 90% stenosis in its mid to distal main trunk as described above.  Following angioplasty and stent placement to 4 mm there is less than 5% residual stenosis..  The SFA is occluded.  The vein bypass initially appears to be occluded with 2 critical lesions noted proximally near the femoral anastomosis.  In the end this appeared to be an occlusion of the peroneal distal to the bypass which is the single-vessel runoff prevented any forward flow throughout the entire bypass.  Following angioplasty of the proximal 2 lesions in the vein bypass as described above there is wide patency with less than 10% residual stenosis.  Following angioplasty and stent placement using coronary stents of the peroneal artery there is an excellent result with rapid flow of contrast less than 10% residual stenosis and filling of the foot.    Summary: Successful recanalization right lower extremity for limb salvage                        Disposition: Patient was taken to the recovery room in stable  condition having tolerated the procedure well.  Austin [name], Latina Craver 12/05/2023,1:55 PM

## 2023-12-05 NOTE — Progress Notes (Signed)
 PHARMACY - ANTICOAGULATION CONSULT NOTE  Pharmacy Consult for heparin infusion  Indication: arterial occlusion/ischemic limb  No Known Allergies  Patient Measurements: Height: 6\' 4"  (193 cm) Weight: 101.3 kg (223 lb 5.2 oz) IBW/kg (Calculated) : 86.8 Heparin Dosing Weight: arterial occlusion/ischemic limb  Vital Signs: Temp: 98.4 F (36.9 C) (02/28 0720) Temp Source: Oral (02/28 0720) BP: 123/79 (02/28 0720) Pulse Rate: 83 (02/28 0720)  Labs: Recent Labs    12/04/23 1732 12/04/23 1744 12/05/23 0048 12/05/23 0443 12/05/23 0958  HGB  --  14.0  --  14.6  --   HCT  --  41.8  --  43.6  --   PLT  --  182  --  200  --   APTT  --  36 155*  --  78*  LABPROT  --  15.9*  --   --   --   INR  --  1.2  --   --   --   HEPARINUNFRC  --  >1.10*  --   --  >1.10*  CREATININE 0.79  --   --  0.81  --     Estimated Creatinine Clearance: 107.2 mL/min (by C-G formula based on SCr of 0.81 mg/dL).   Medical History: Past Medical History:  Diagnosis Date   Coronary artery disease    Diabetes (HCC)    DVT (deep venous thrombosis) (HCC)    Gangrene of left foot (HCC) 02/09/2018   GERD (gastroesophageal reflux disease)    Hyperlipidemia    Hypertension    Peripheral vascular disease (HCC)     Medications:  PTA: Apixaban 5mg  BID  Assessment: 68 yo male with PMH of BKA, DM, DVT, and recent amputation of right 4th digit. Patient seem in ED with right lower leg pain and swelling. Right leg reported cold to the touch. Pharmacy consulted to start heparin infusion for arterial occlusion/ischemic limb. Patient reported took last apixaban dose at 0600 on 2/27.   Goal of Therapy:  Heparin level 0.3-0.7 units/ml aPTT 66-102 seconds Monitor platelets by anticoagulation protocol: Yes  Dose Changes: 2/27 Infusion started at 1600u/hr 2/28 aPTT =155, infusion reduced to 1250 units/hr   Plan:  2/28:  aPTT @ 0958 = 78, therapeutic x 1 - Continue heparin infusion rate of 1250 units/hr - Will  recheck aPTT in 6 hrs.  Baseline anti-Xa level elevated due to apixaban PTA. Will adjust dose by aPTT levels until aPTT and anti-Xa levels correlate. Check anti-Xa level daily while on heparin. Continue to monitor H&H and platelets  Gardner Candle, PharmD, BCPS Clinical Pharmacist 12/05/2023 10:30 AM

## 2023-12-05 NOTE — Progress Notes (Signed)
 Progress Note   Patient: Austin [name] WUJ:[id number] DOB: Aug 12, 1956 DOA: 12/04/2023     1 DOS: the patient was seen and examined on 12/05/2023   Brief hospital course: 67yo with h/o PVD (s/p L BKA and R common femoral to TP trunk bypass with vein on 02/25/2022), HTN, HLD, DM, CAD, chronic diastolic CHF, DVT/A fib on Eliquis, and recent R 4th toe amputation who presented on 2/27 with RLE pain.  He was found to have critical limb ischemia and vascular surgery is consulted.  Angiogram scheduled for 2/28.  Assessment and Plan:  Critical limb ischemia of right lower extremity and hx of PVD Patient with recent R 4th toe amputation, presenting with RLE pain and found to have critical limb ischemia Dr. Gilda Crease of [facility] is consulted by EDP, angiogram 2/28 Admitted to telemetry bed as inpatient IV heparin Hold Plavix (no longer on ASA) and Eliquis for possible surgery Continue Lipitor Pain control: As needed Percocet, morphine, Tylenol   Chronic diastolic CHF (congestive heart failure) 2D echo on 11/29/2015 showed EF 55% Appears compensated   Essential hypertension Continue losartan IV hydralazine as needed   Hyperlipidemia Continue Lipitor   Coronary artery disease Continue Aspirin, Lipitor   Atrial fibrillation, chronic  Rate controlled Switched Eliquis to IV heparin as above   History of DVT (deep venous thrombosis)  On IV heparin   S/p of recent right small toe amputation Small open wound, no drainage. Wound care by Nurse      Consultants: Vascular surgery  Procedures: RLE Angiogram 2/28  Antibiotics: None  30 Day Unplanned Readmission Risk Score    Flowsheet Row ED to Hosp-Admission (Current) from 12/04/2023 in [facility] (1A)  30 Day Unplanned Readmission Risk Score (%) 14.22 Filed at 12/05/2023 0400       This score is the patient's risk of an unplanned readmission within 30 days of being discharged (0 -100%). The score is  based on dignosis, age, lab data, medications, orders, and past utilization.   Low:  0-14.9   Medium: 15-21.9   High: 22-29.9   Extreme: 30 and above           Subjective: Feeling ok this AM, no specific complaints.  Planning for OR today.   Objective: Vitals:   12/05/23 1415 12/05/23 1430  BP: 116/79 (!) 141/83  Pulse: 84 65  Resp: (!) 22 17  Temp:    SpO2: 92% 95%    Intake/Output Summary (Last 24 hours) at 12/05/2023 1441 Last data filed at 12/05/2023 0500 Gross per 24 hour  Intake 432.38 ml  Output 825 ml  Net -392.62 ml   Filed Weights   12/04/23 1742 12/05/23 0500 12/05/23 1103  Weight: 105.7 kg 101.3 kg 101.3 kg    Exam:  General:  Appears calm and comfortable and is in NAD Eyes:   normal lids, iris ENT:  grossly normal hearing, lips & tongue, mmm Cardiovascular:  RRR, no m/r/g.  Respiratory:   CTA bilaterally with no wheezes/rales/rhonchi.  Normal respiratory effort. Abdomen:  soft, NT, ND Skin:  no rash or induration seen on limited exam Musculoskeletal:  L BKA stump C/D/I; R foot wrapped with stasis changes of R lower leg Psychiatric:  blunted mood and affect, speech fluent and appropriate Neurologic:  CN 2-12 grossly intact, moves all extremities in coordinated fashion  Data Reviewed: I have reviewed the patient's lab results since admission.  Pertinent labs for today include:   Glucose 146 WBC 12.3  Family Communication: None present  Disposition: Status is: Inpatient Remains inpatient appropriate because: surgery today     Time spent: 50 minutes  Unresulted Labs (From admission, onward)     Start     Ordered   12/06/23 0900  Heparin level (unfractionated)  Tomorrow morning,   R        12/05/23 1431   12/06/23 0900  Heparin level (unfractionated)  Once-Timed,   TIMED        12/05/23 1431   12/06/23 0500  CBC  Tomorrow morning,   R        12/05/23 1033   Unscheduled  Basic metabolic panel  Tomorrow morning,   R        12/05/23  1441             Author: Jonah Blue, MD 12/05/2023 2:41 PM  For on call review [email or web address].

## 2023-12-05 NOTE — Interval H&P Note (Signed)
 History and Physical Interval Note:  12/05/2023 12:07 PM  Austin [name]  has presented today for surgery, with the diagnosis of PAD.  The various methods of treatment have been discussed with the patient and family. After consideration of risks, benefits and other options for treatment, the patient has consented to  Procedure(s): Lower Extremity Angiography (Right) as a surgical intervention.  [name] history has been reviewed, patient examined, no change in status, stable for surgery.  I have reviewed the patient's chart and labs.  Questions were answered to the patient's satisfaction.     [name]

## 2023-12-06 DIAGNOSIS — I70221 Atherosclerosis of native arteries of extremities with rest pain, right leg: Secondary | ICD-10-CM | POA: Diagnosis not present

## 2023-12-06 LAB — GLUCOSE, CAPILLARY
Glucose-Capillary: 91 mg/dL (ref 70–99)
Glucose-Capillary: 165 mg/dL — ABNORMAL HIGH (ref 70–99)

## 2023-12-06 LAB — CBC
HCT: 42 % (ref 39.0–52.0)
Hemoglobin: 14.1 g/dL (ref 13.0–17.0)
MCH: 27.9 pg (ref 26.0–34.0)
MCHC: 33.6 g/dL (ref 30.0–36.0)
MCV: 83.2 fL (ref 80.0–100.0)
Platelets: 171 10*3/uL (ref 150–400)
RBC: 5.05 MIL/uL (ref 4.22–5.81)
RDW: 15.4 % (ref 11.5–15.5)
WBC: 10.2 10*3/uL (ref 4.0–10.5)
nRBC: 0 % (ref 0.0–0.2)

## 2023-12-06 LAB — BASIC METABOLIC PANEL
Anion gap: 7 (ref 5–15)
BUN: 12 mg/dL (ref 8–23)
CO2: 23 mmol/L (ref 22–32)
Calcium: 8.9 mg/dL (ref 8.9–10.3)
Chloride: 108 mmol/L (ref 98–111)
Creatinine, Ser: 0.71 mg/dL (ref 0.61–1.24)
GFR, Estimated: >60 mL/min (ref >60)
Glucose, Bld: 103 mg/dL — ABNORMAL HIGH (ref 70–99)
Potassium: 4 mmol/L (ref 3.5–5.1)
Sodium: 138 mmol/L (ref 135–145)

## 2023-12-06 LAB — HEPARIN LEVEL (UNFRACTIONATED): Heparin Unfractionated: 0.57 [IU]/mL (ref 0.30–0.70)

## 2023-12-06 LAB — APTT: aPTT: 69 s — ABNORMAL HIGH (ref 24–36)

## 2023-12-06 MED ORDER — CLOPIDOGREL BISULFATE 75 MG PO TABS
75.0000 mg | ORAL_TABLET | Freq: Every day | ORAL | Status: DC
  Administered 2023-12-06: 75 mg via ORAL
  Filled 2023-12-06: qty 1

## 2023-12-06 MED ORDER — NICOTINE 21 MG/24HR TD PT24: 21.0000 mg | MEDICATED_PATCH | TRANSDERMAL | 0 refills | Status: AC

## 2023-12-06 MED ORDER — ASPIRIN 81 MG PO TBEC
81.0000 mg | DELAYED_RELEASE_TABLET | Freq: Every day | ORAL | Status: DC
  Administered 2023-12-06: 81 mg via ORAL
  Filled 2023-12-06: qty 1

## 2023-12-06 NOTE — Progress Notes (Signed)
 PHARMACY - ANTICOAGULATION CONSULT NOTE  Pharmacy Consult for heparin infusion  Indication: arterial occlusion/ischemic limb  No Known Allergies  Patient Measurements: Height: 6\' 4"  (193 cm) Weight: 105.6 kg (232 lb 12.9 oz) IBW/kg (Calculated) : 86.8 Heparin Dosing Weight: arterial occlusion/ischemic limb  Vital Signs: Temp: 98.2 F (36.8 C) (03/01 0400) Temp Source: Oral (03/01 0400) BP: 140/98 (03/01 0400) Pulse Rate: 75 (03/01 0400)  Labs: Recent Labs     0000 12/04/23 1732 12/04/23 1744 12/05/23 0048 12/05/23 0443 12/05/23 0958 12/05/23 2135 12/06/23 0605  HGB   < >  --  14.0  --  14.6  --   --  14.1  HCT  --   --  41.8  --  43.6  --   --  42.0  PLT  --   --  182  --  200  --   --  171  APTT  --   --  36   < >  --  78* 66* 69*  LABPROT  --   --  15.9*  --   --   --   --   --   INR  --   --  1.2  --   --   --   --   --   HEPARINUNFRC  --   --  >1.10*  --   --  >1.10*  --  0.57  CREATININE  --  0.79  --   --  0.81  --   --   --    < > = values in this interval not displayed.    Estimated Creatinine Clearance: 116.4 mL/min (by C-G formula based on SCr of 0.81 mg/dL).   Medical History: Past Medical History:  Diagnosis Date   Coronary artery disease    Diabetes (HCC)    DVT (deep venous thrombosis) (HCC)    Gangrene of left foot (HCC) 02/09/2018   GERD (gastroesophageal reflux disease)    Hyperlipidemia    Hypertension    Peripheral vascular disease (HCC)     Medications:  PTA: Apixaban 5mg  BID  Assessment: 68 yo male with PMH of BKA, DM, DVT, and recent amputation of right 4th digit. Patient seem in ED with right lower leg pain and swelling. Right leg reported cold to the touch. Pharmacy consulted to start heparin infusion for arterial occlusion/ischemic limb. Patient reported took last apixaban dose at 0600 on 2/27.   Goal of Therapy:  Heparin level 0.3-0.7 units/ml aPTT 66-102 seconds Monitor platelets by anticoagulation protocol: Yes  Dose  Changes: 2/27 Infusion started at 1600u/hr 2/28 aPTT =155, infusion reduced to 1250 units/hr 3/1 aPTT 69/ HL 0.57 now correlating    Therapeutic x3   Plan:  3/1 aPTT 69/ HL 0.57 now correlating    Therapeutic x3 - will continue this pt on current rate at 1250 units/hr and f/u HL with AM labs. (aPTT and HL now correlating)  Check anti-Xa level daily while on heparin. Continue to monitor H&H and platelets  Angelique Blonder, PharmD Clinical Pharmacist 12/06/2023 7:42 AM

## 2023-12-06 NOTE — Progress Notes (Signed)
 1 Day Post-Op   Subjective/Chief Complaint: States his RIGHT foot is a bit sore. Denies LEFT groin pain   Objective: Vital signs in last 24 hours: Temp:  [97.4 F (36.3 C)-98.7 F (37.1 C)] 97.5 F (36.4 C) (03/01 0831) Pulse Rate:  [60-97] 79 (03/01 0831) Resp:  [15-22] 18 (03/01 0831) BP: (110-153)/(66-102) 124/66 (03/01 0831) SpO2:  [92 %-100 %] 98 % (03/01 0831) Weight:  [101.3 kg-105.6 kg] 105.6 kg (03/01 0500) Last BM Date : 12/04/23  Intake/Output from previous day: 02/28 0701 - 03/01 0700 In: 694.1 [I.V.:694.1] Out: 125 [Urine:125] Intake/Output this shift: No intake/output data recorded.  General appearance: alert and no distress Cardio: regular rate and rhythm Extremities: LEFT groin access site, soft, no hematoma, Right lower extremity warm  Lab Results:  Recent Labs    12/05/23 0443 12/06/23 0605  WBC 12.3* 10.2  HGB 14.6 14.1  HCT 43.6 42.0  PLT 200 171   BMET Recent Labs    12/05/23 0443 12/06/23 0605  NA 139 138  K 4.0 4.0  CL 107 108  CO2 27 23  GLUCOSE 146* 103*  BUN 13 12  CREATININE 0.81 0.71  CALCIUM 9.4 8.9   PT/INR Recent Labs    12/04/23 1744  LABPROT 15.9*  INR 1.2   ABG No results for input(s): "PHART", "HCO3" in the last 72 hours.  Invalid input(s): "PCO2", "PO2"  Studies/Results: PERIPHERAL VASCULAR CATHETERIZATION Result Date: 12/05/2023 See surgical note for result.   Anti-infectives: Anti-infectives (From admission, onward)    Start     Dose/Rate Route Frequency Ordered Stop   12/05/23 0919  ceFAZolin (ANCEF) IVPB 2g/100 mL premix        2 g 200 mL/hr over 30 Minutes Intravenous 30 min pre-op 12/05/23 0919 12/05/23 1238       Assessment/Plan: s/p Procedure(s): Lower Extremity Angiography (Right) POD #1 s/p    Percutaneous transluminal angioplasty and stent placement right profunda femoris artery   Percutaneous transluminal angioplasty and stent placement of the right peroneal artery.   Percutaneous  transluminal angioplasty of the vein bypass proximally.    Star close closure left common femoral arteriotomy  OK for OOB as tolerated. Will D/C Heparin gtt ASA/Plavix OK from Vascular standpoint for discharge. Follow up as scheduled with Vascular Surgery.  LOS: 2 days    Bertram Denver 12/06/2023

## 2023-12-06 NOTE — Discharge Summary (Signed)
 Physician Discharge Summary   Patient: Austin [name] MRN: [id number] DOB: 01-27-1956  Admit date:     12/04/2023  Discharge date: 12/06/23  Discharge Physician: Jonah Blue   PCP: Preston Fleeting, MD   Recommendations at discharge:   Resume Plavix and Eliquis Follow up as recommended with vascular surgery Follow up with Dr. Neita Goodnight in 1-2 weeks #30 Percocet were prescribed on 2/25, continue to take these as needed for pain not controlled with Tylenol Stop smoking!  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  He was found to have critical limb ischemia and vascular surgery is consulted.  Angiogram scheduled for 2/28.  Assessment and Plan:  Critical limb ischemia of right lower extremity and hx of PVD Patient with recent R 4th toe amputation, presenting with RLE pain and found to have critical limb ischemia Dr. Gilda Crease of [facility] is consulted by EDP, angiogram 2/28 with angioplasty and placement of 2 stents IV heparin stopped Vascular surgery has seen patient and cleared for discharge today Resume Plavix (no longer on ASA) and Eliquis  Continue Lipitor #30 Percocet were prescribed on 2/25, continue to take these as needed for pain not controlled with Tylenol   Chronic diastolic CHF (congestive heart failure) 2D echo on 11/29/2015 showed EF  55% Appears compensated   Essential hypertension Continue losartan   Hyperlipidemia Continue Lipitor   Coronary artery disease Continue Plavix, Lipitor   Atrial fibrillation, chronic  Rate controlled Resume Eliquis   History of DVT (deep venous thrombosis)  Resume Eliquis   S/p of recent right small toe amputation Small open wound, no drainage. Wound care by Nurse  Tobacco dependence Stop smoking!   Nicotine patch prescribed   DM Recent A1c was 8.3, poor control Continue Lantus, Humalog, Trulicity Continue gabapentin        Consultants: Vascular surgery   Procedures: RLE Angiogram 2/28   Antibiotics: None   Pain control -  Controlled Substance Reporting System database was reviewed. and patient was instructed, not to drive, operate heavy machinery, perform activities at heights, swimming or participation in water activities or provide baby-sitting services while on Pain, Sleep and Anxiety Medications; until their outpatient Physician has advised to do so again. Also recommended to not to take more than prescribed Pain, Sleep and Anxiety Medications.   Disposition: Home Diet recommendation:  Cardiac diet DISCHARGE MEDICATION: Allergies as of 12/06/2023   No Known Allergies      Medication List     STOP taking these medications    Nicorette Starter Kit 4 MG gum Generic drug: nicotine polacrilex       TAKE these medications    ascorbic acid 500 MG tablet Commonly known as: VITAMIN C Take 1 tablet (500 mg total) by mouth 2 (two) times daily.   atorvastatin 80 MG tablet Commonly known as: LIPITOR Take 1 tablet (80 mg  total) by mouth daily at 6 PM.   clopidogrel 75 MG tablet Commonly known as: PLAVIX Take 1 tablet (75 mg total) by mouth daily at 6 (six) AM.   Eliquis 5 MG Tabs tablet Generic drug: apixaban Take 5 mg by mouth 2 (two) times daily.   gabapentin 100 MG capsule Commonly known as: NEURONTIN Take 100 mg by mouth 3  (three) times daily.   HumaLOG KwikPen 100 UNIT/ML KwikPen Generic drug: insulin lispro Inject 18 Units into the skin 3 (three) times daily.   Lantus SoloStar 100 UNIT/ML Solostar Pen Generic drug: insulin glargine Inject 41 Units into the skin 2 (two) times daily. What changed:  how much to take when to take this   losartan 25 MG tablet Commonly known as: COZAAR Take 1 tablet (25 mg total) by mouth daily.   nicotine 21 mg/24hr patch Commonly known as: NICODERM CQ - dosed in mg/24 hours Place 1 patch (21 mg total) onto the skin daily.   oxyCODONE-acetaminophen 5-325 MG tablet Commonly known as: Percocet Take 1 tablet by mouth every 4 (four) hours as needed for severe pain (pain score 7-10).   Trulicity 1.5 MG/0.5ML Soaj Generic drug: Dulaglutide Inject 1.5 mg into the skin once a week.   Vitamin D3 125 MCG (5000 UT) Caps Take 1 capsule by mouth daily.   Zinc Sulfate 220 (50 Zn) MG Tabs Take 1 tablet (220 mg total) by mouth daily after supper.               Discharge Care Instructions  (From admission, onward)           Start     Ordered   12/06/23 0000  Discharge wound care:       Comments: Continue Betadine wet-to-dry dressing changes once a day   12/06/23 1208            Discharge Exam:   Subjective: Feels fine, no complaints, eager to go home.   Objective: Vitals:   12/06/23 0400 12/06/23 0831  BP: (!) 140/98 124/66  Pulse: 75 79  Resp: 15 18  Temp: 98.2 F (36.8 C) (!) 97.5 F (36.4 C)  SpO2: 100% 98%    Intake/Output Summary (Last 24 hours) at 12/06/2023 1208 Last data filed at 12/06/2023 0900 Gross per 24 hour  Intake 694.07 ml  Output 475 ml  Net 219.07 ml   Filed Weights   12/05/23 0500 12/05/23 1103 12/06/23 0500  Weight: 101.3 kg 101.3 kg 105.6 kg    Exam:  General:  Appears calm and comfortable and is in NAD Eyes:   normal lids, iris ENT:  grossly normal hearing, lips & tongue, mmm Cardiovascular:  RRR, no m/r/g.   Respiratory:   CTA bilaterally with no wheezes/rales/rhonchi.  Normal respiratory effort. Abdomen:  soft, NT, ND Skin:  no rash or induration seen on limited exam Musculoskeletal:  L BKA stump C/D/I; R foot wrapped with stasis changes of R lower leg Psychiatric:  blunted mood and affect, speech fluent and appropriate Neurologic:  CN 2-12 grossly intact, moves all extremities in coordinated fashion  Data Reviewed: I have reviewed the patient's lab results since admission.  Pertinent labs for today include:   Stable BMP Normal CBC    Condition at discharge: stable  The results of significant diagnostics from this hospitalization (including imaging, microbiology, ancillary and laboratory) are listed below for reference.   Imaging Studies: PERIPHERAL VASCULAR CATHETERIZATION Result Date: 12/05/2023 See surgical note for result.  DG Foot Complete Right  Result Date: 11/13/2023 Please see detailed radiograph report in office note.   Microbiology: Results for orders placed or performed during the hospital encounter of 11/03/23  SARS Coronavirus 2 by RT PCR (hospital order, performed in [facility] hospital lab) *cepheid single result test* Anterior Nasal Swab     Status: None   Collection Time: 11/03/23 10:37 PM   Specimen: Anterior Nasal Swab  Result Value Ref Range Status   SARS Coronavirus 2 by RT PCR NEGATIVE NEGATIVE Final    Comment: (NOTE) SARS-CoV-2 target nucleic acids are NOT DETECTED.  The SARS-CoV-2 RNA is generally detectable in upper and lower respiratory specimens during the acute phase of infection. The lowest concentration of SARS-CoV-2 viral copies this assay can detect is 250 copies / mL. A negative result does not preclude SARS-CoV-2 infection and should not be used as the sole basis for treatment or other patient management decisions.  A negative result may occur with improper specimen collection / handling, submission of specimen other than nasopharyngeal  swab, presence of viral mutation(s) within the areas targeted by this assay, and inadequate number of viral copies (<250 copies / mL). A negative result must be combined with clinical observations, patient history, and epidemiological information.  Fact Sheet for Patients:   RoadLapTop.co.za  Fact Sheet for Healthcare Providers: http://kim-miller.com/  This test is not yet approved or  cleared by the [place] FDA and has been authorized for detection and/or diagnosis of SARS-CoV-2 by FDA under an Emergency Use Authorization (EUA).  This EUA will remain in effect (meaning this test can be used) for the duration of the COVID-19 declaration under Section 564(b)(1) of the Act, 21 U.S.C. section 360bbb-3(b)(1), unless the authorization is terminated or revoked sooner.  Performed at [facility], [address] [place], [place] [address]     Labs: CBC: Recent Labs  Lab 12/04/23 1744 12/05/23 0443 12/06/23 0605  WBC 10.7* 12.3* 10.2  HGB 14.0 14.6 14.1  HCT 41.8 43.6 42.0  MCV 83.9 82.1 83.2  PLT 182 200 171   Basic Metabolic Panel: Recent Labs  Lab 12/04/23 1732 12/05/23 0443 12/06/23 0605  NA 140 139 138  K 3.8 4.0 4.0  CL 106 107 108  CO2 24 27 23   GLUCOSE 130* 146* 103*  BUN 13 13 12   CREATININE 0.79 0.81 0.71  CALCIUM 8.9 9.4 8.9   Liver Function Tests: Recent Labs  Lab 12/04/23 1732  AST 20  ALT 15  ALKPHOS 65  BILITOT 1.0  PROT 6.7  ALBUMIN 3.2*   CBG: Recent Labs  Lab 12/05/23 1125 12/05/23 1724 12/05/23 2125 12/06/23 0739 12/06/23 1133  GLUCAP 100* 78 147* 91 165*    Discharge time spent: greater than 30 minutes.  Signed: Jonah Blue, MD [facility] 12/06/2023

## 2023-12-06 NOTE — Progress Notes (Signed)
 Patient was given verbal and written discharge instructions, he acknowledge understanding and states he will comply, bandage to right foot was changed, waiting on ride to go home.

## 2023-12-06 NOTE — Plan of Care (Signed)
   Problem: Education: Goal: Knowledge of General Education information will improve Description: Including pain rating scale, medication(s)/side effects and non-pharmacologic comfort measures Outcome: Progressing   Problem: Health Behavior/Discharge Planning: Goal: Ability to manage health-related needs will improve Outcome: Progressing   Problem: Activity: Goal: Risk for activity intolerance will decrease Outcome: Progressing   Problem: Coping: Goal: Level of anxiety will decrease Outcome: Progressing   Problem: Pain Managment: Goal: General experience of comfort will improve and/or be controlled Outcome: Progressing

## 2023-12-08 ENCOUNTER — Encounter: Payer: Self-pay | Admitting: Vascular Surgery

## 2023-12-09 ENCOUNTER — Other Ambulatory Visit (INDEPENDENT_AMBULATORY_CARE_PROVIDER_SITE_OTHER): Payer: Self-pay | Admitting: Vascular Surgery

## 2023-12-09 DIAGNOSIS — Z95828 Presence of other vascular implants and grafts: Secondary | ICD-10-CM

## 2023-12-09 DIAGNOSIS — Z9889 Other specified postprocedural states: Secondary | ICD-10-CM

## 2023-12-10 ENCOUNTER — Ambulatory Visit (INDEPENDENT_AMBULATORY_CARE_PROVIDER_SITE_OTHER): Payer: 59 | Admitting: Nurse Practitioner

## 2023-12-10 ENCOUNTER — Encounter (INDEPENDENT_AMBULATORY_CARE_PROVIDER_SITE_OTHER): Payer: Self-pay | Admitting: Nurse Practitioner

## 2023-12-10 ENCOUNTER — Ambulatory Visit (INDEPENDENT_AMBULATORY_CARE_PROVIDER_SITE_OTHER): Payer: 59

## 2023-12-10 VITALS — BP 121/74 | HR 53 | Resp 16 | Wt 235.0 lb

## 2023-12-10 DIAGNOSIS — Z95828 Presence of other vascular implants and grafts: Secondary | ICD-10-CM

## 2023-12-10 DIAGNOSIS — Z9889 Other specified postprocedural states: Secondary | ICD-10-CM | POA: Diagnosis not present

## 2023-12-10 DIAGNOSIS — I739 Peripheral vascular disease, unspecified: Secondary | ICD-10-CM

## 2023-12-10 DIAGNOSIS — I7025 Atherosclerosis of native arteries of other extremities with ulceration: Secondary | ICD-10-CM | POA: Diagnosis not present

## 2023-12-10 DIAGNOSIS — E782 Mixed hyperlipidemia: Secondary | ICD-10-CM | POA: Diagnosis not present

## 2023-12-10 DIAGNOSIS — I10 Essential (primary) hypertension: Secondary | ICD-10-CM | POA: Diagnosis not present

## 2023-12-10 MED ORDER — CLOPIDOGREL BISULFATE 75 MG PO TABS: 75.0000 mg | ORAL_TABLET | Freq: Every day | ORAL | 6 refills | Status: AC

## 2023-12-12 LAB — VAS US ABI WITH/WO TBI: Right ABI: 0.74

## 2023-12-15 ENCOUNTER — Encounter (INDEPENDENT_AMBULATORY_CARE_PROVIDER_SITE_OTHER): Payer: Self-pay | Admitting: Nurse Practitioner

## 2023-12-15 NOTE — Progress Notes (Signed)
 Subjective:    Patient ID: Austin [name], male    DOB: 11/09/1955, 68 y.o.   MRN: [id number] Chief Complaint  Patient presents with   Follow-up    Austin [name] and arterial duplex follow up    The patient returns to the office for followup and review status post angiogram with intervention on 12/05/2023.   Procedure: Procedure(s) Performed:             1.  Introduction catheter into right lower extremity 3rd order catheter placement with additional third order             2.    Contrast injection right lower extremity for distal runoff             3.  Percutaneous transluminal angioplasty and stent placement right profunda femoris artery             4.  Percutaneous transluminal angioplasty and stent placement of the right peroneal artery.              5.  Percutaneous transluminal angioplasty of the vein bypass proximally.                6.  Star close closure left common femoral arteriotomy   The patient notes improvement in the lower extremity symptoms.  He continues to have a wound present at but the pain that he was experiencing is improved.  There have been no significant changes to the patient's overall health care.  No documented history of amaurosis fugax or recent TIA symptoms. There are no recent neurological changes noted. No documented history of DVT, PE or superficial thrombophlebitis. The patient denies recent episodes of angina or shortness of breath.   ABI's Rt=0.74 and Lt=n/a  (previous ABI's Rt=0.84 and Lt=n/a) Duplex US of the right lower extremity shows monophasic tibial waveforms    Review of Systems  Musculoskeletal:  Positive for gait problem.  Skin:  Positive for wound.  Neurological:  Positive for weakness.  All other systems reviewed and are negative.      Objective:   Physical Exam Vitals reviewed.  HENT:     Head: Normocephalic.  Cardiovascular:     Rate and Rhythm: Normal rate.     Pulses:          Dorsalis pedis pulses are detected w/  Doppler on the right side.       Posterior tibial pulses are detected w/ Doppler on the right side.  Pulmonary:     Effort: Pulmonary effort is normal.  Musculoskeletal:     Left Lower Extremity: Left leg is amputated below knee.  Skin:    General: Skin is warm and dry.  Neurological:     Mental Status: He is alert.     BP 121/74   Pulse (!) 53   Resp 16   Wt 235 lb (106.6 kg)   BMI 28.61 kg/m   Past Medical History:  Diagnosis Date   Coronary artery disease    Diabetes (HCC)    DVT (deep venous thrombosis) (HCC)    Gangrene of left foot (HCC) 02/09/2018   GERD (gastroesophageal reflux disease)    Hyperlipidemia    Hypertension    Peripheral vascular disease (HCC)     Social History   Socioeconomic History   Marital status: Divorced    Spouse name: Not on file   Number of children: Not on file   Years of education: Not on file   Highest education level: Not on  file  Occupational History   Not on file  Tobacco Use   Smoking status: Former    Current packs/day: 0.00    Average packs/day: 1 pack/day for 47.0 years (47.0 ttl pk-yrs)    Types: Cigarettes    Start date: 01/1974    Quit date: 01/2021    Years since quitting: 2.9   Smokeless tobacco: Never  Vaping Use   Vaping status: Never Used  Substance and Sexual Activity   Alcohol use: No   Drug use: No   Sexual activity: Not Currently  Other Topics Concern   Not on file  Social History Narrative   Not on file   Social Drivers of Health   Financial Resource Strain: Not on file  Food Insecurity: No Food Insecurity (12/05/2023)   Hunger Vital Sign    Worried About Running Out of Food in the Last Year: Never true    Ran Out of Food in the Last Year: Never true  Transportation Needs: No Transportation Needs (12/05/2023)   PRAPARE - Administrator, Civil Service (Medical): No    Lack of Transportation (Non-Medical): No  Physical Activity: Not on file  Stress: Not on file  Social Connections:  Moderately Isolated (12/05/2023)   Social Connection and Isolation Panel [NHANES]    Frequency of Communication with Friends and Family: More than three times a week    Frequency of Social Gatherings with Friends and Family: More than three times a week    Attends Religious Services: 1 to 4 times per year    Active Member of [employer] or Organizations: No    Attends Banker Meetings: Never    Marital Status: Divorced  Catering manager Violence: Not At Risk (12/05/2023)   Humiliation, Afraid, Rape, and Kick questionnaire    Fear of Current or Ex-Partner: No    Emotionally Abused: No    Physically Abused: No    Sexually Abused: No    Past Surgical History:  Procedure Laterality Date   ABDOMINAL AORTOGRAM W/LOWER EXTREMITY N/A 02/20/2022   Procedure: ABDOMINAL AORTOGRAM W/LOWER EXTREMITY;  Surgeon: Cephus Shelling, MD;  Location: [facility];  Service: Cardiovascular;  Laterality: N/A;   AMPUTATION Left 10/28/2018   Procedure: AMPUTATION BELOW KNEE;  Surgeon: Annice Needy, MD;  Location: [facility];  Service: Vascular;  Laterality: Left;   AMPUTATION Left 09/10/2021   Procedure: AMPUTATION DIGIT;  Surgeon: Allena Napoleon, MD;  Location: [facility];  Service: Plastics;  Laterality: Left;   AMPUTATION Left 09/14/2021   Procedure: AMPUTATION DIGIT left long finger and irrigation and debridement;  Surgeon: Allena Napoleon, MD;  Location: [facility];  Service: Plastics;  Laterality: Left;   AMPUTATION Right 02/27/2022   Procedure: RIGHT PARTIAL AMPUTATION FOOT;  Surgeon: Felecia Shelling, DPM;  Location: [facility];  Service: Podiatry;  Laterality: Right;   AMPUTATION TOE Left 02/10/2018   Procedure: AMPUTATION TOE;  Surgeon: Linus Galas, DPM;  Location: [facility];  Service: Podiatry;  Laterality: Left;   AMPUTATION TOE Right 11/06/2023   Procedure: Right 4th Toe Amputation;  Surgeon: Pilar Plate, DPM;  Location: [facility];  Service: Orthopedics/Podiatry;  Laterality: Right;    APPLICATION OF WOUND VAC Left 10/16/2018   Procedure: APPLICATION OF WOUND VAC;  Surgeon: Gwyneth Revels, DPM;  Location: [facility];  Service: Podiatry;  Laterality: Left;   DORSAL SLIT N/A 01/12/2021   Procedure: DORSAL SLIT;  Surgeon: Sondra Come, MD;  Location: [facility];  Service:  Urology;  Laterality: N/A;   FEMORAL-TIBIAL BYPASS GRAFT Right 02/25/2022   Procedure: RIGHT LOWER EXTREMITY FEMORAL TO TIBIAL PERONEAL TRUNK BYPASS;  Surgeon: Cephus Shelling, MD;  Location: [facility];  Service: Vascular;  Laterality: Right;  INSERT ARTERIAL LINE   groin surgery     IRRIGATION AND DEBRIDEMENT FOOT Left 09/30/2018   Procedure: IRRIGATION AND DEBRIDEMENT FOOT;  Surgeon: Linus Galas, DPM;  Location: [facility];  Service: Podiatry;  Laterality: Left;   LOWER EXTREMITY ANGIOGRAPHY Left 02/12/2018   Procedure: Lower Extremity Angiography;  Surgeon: Annice Needy, MD;  Location: [facility];  Service: Cardiovascular;  Laterality: Left;   LOWER EXTREMITY ANGIOGRAPHY Left 10/01/2018   Procedure: Lower Extremity Angiography;  Surgeon: Annice Needy, MD;  Location: [facility];  Service: Cardiovascular;  Laterality: Left;   LOWER EXTREMITY ANGIOGRAPHY Left 10/19/2018   Procedure: Lower Extremity Angiography;  Surgeon: Annice Needy, MD;  Location: [facility];  Service: Cardiovascular;  Laterality: Left;   LOWER EXTREMITY ANGIOGRAPHY Left 10/20/2018   Procedure: LOWER EXTREMITY ANGIOGRAPHY;  Surgeon: Annice Needy, MD;  Location: [facility];  Service: Cardiovascular;  Laterality: Left;   LOWER EXTREMITY ANGIOGRAPHY Right 11/25/2018   Procedure: LOWER EXTREMITY ANGIOGRAPHY;  Surgeon: Annice Needy, MD;  Location: [facility];  Service: Cardiovascular;  Laterality: Right;   LOWER EXTREMITY ANGIOGRAPHY Right 01/10/2020   Procedure: LOWER EXTREMITY ANGIOGRAPHY;  Surgeon: Annice Needy, MD;  Location: [facility];  Service: Cardiovascular;  Laterality: Right;   LOWER  EXTREMITY ANGIOGRAPHY Right 12/11/2020   Procedure: LOWER EXTREMITY ANGIOGRAPHY;  Surgeon: Annice Needy, MD;  Location: [facility];  Service: Cardiovascular;  Laterality: Right;   LOWER EXTREMITY ANGIOGRAPHY Right 06/26/2023   Procedure: Lower Extremity Angiography;  Surgeon: Renford Dills, MD;  Location: [facility];  Service: Cardiovascular;  Laterality: Right;   LOWER EXTREMITY ANGIOGRAPHY Right 11/05/2023   Procedure: Lower Extremity Angiography;  Surgeon: Annice Needy, MD;  Location: [facility];  Service: Cardiovascular;  Laterality: Right;   LOWER EXTREMITY ANGIOGRAPHY Right 12/05/2023   Procedure: Lower Extremity Angiography;  Surgeon: Renford Dills, MD;  Location: [facility];  Service: Cardiovascular;  Laterality: Right;   LOWER EXTREMITY INTERVENTION  02/12/2018   Procedure: LOWER EXTREMITY INTERVENTION;  Surgeon: Annice Needy, MD;  Location: [facility];  Service: Cardiovascular;;   MINOR IRRIGATION AND DEBRIDEMENT OF WOUND Left 09/10/2021   Procedure: IRRIGATION AND DEBRIDEMENT OF LEFT LONG FINGER WOUND;  Surgeon: Allena Napoleon, MD;  Location: [facility];  Service: Plastics;  Laterality: Left;   NECK SURGERY     PERIPHERAL VASCULAR INTERVENTION  02/20/2022   Procedure: PERIPHERAL VASCULAR INTERVENTION;  Surgeon: Cephus Shelling, MD;  Location: [facility];  Service: Cardiovascular;;  right iliac   TRANSMETATARSAL AMPUTATION Left 10/16/2018   Procedure: TRANSMETATARSAL AMPUTATION LEFT FOOT;  Surgeon: Gwyneth Revels, DPM;  Location: [facility];  Service: Podiatry;  Laterality: Left;   VEIN HARVEST Right 02/25/2022   Procedure: VEIN HARVEST OF RIGHT GREATER SAPHENOUS VEIN;  Surgeon: Cephus Shelling, MD;  Location: [facility];  Service: Vascular;  Laterality: Right;    Family History  Problem Relation Age of Onset   Diabetes Mother    Coronary artery disease Father    Hypertension Sister    Leukemia Brother     No Known  Allergies     Latest Ref Rng &  Units 12/06/2023    6:05 AM 12/05/2023    4:43 AM 12/04/2023    5:44 PM  CBC  WBC 4.0 - 10.5 K/uL 10.2  12.3  10.7   Hemoglobin 13.0 - 17.0 g/dL 16.1  09.6  04.5   Hematocrit 39.0 - 52.0 % 42.0  43.6  41.8   Platelets 150 - 400 K/uL 171  200  182       CMP     Component Value Date/Time   NA 138 12/06/2023 0605   NA 135 (L) 11/17/2013 0621   K 4.0 12/06/2023 0605   K 4.3 11/17/2013 0621   CL 108 12/06/2023 0605   CL 99 11/17/2013 0621   CO2 23 12/06/2023 0605   CO2 31 11/17/2013 0621   GLUCOSE 103 (H) 12/06/2023 0605   GLUCOSE 172 (H) 11/17/2013 0621   BUN 12 12/06/2023 0605   BUN 10 11/17/2013 0621   CREATININE 0.71 12/06/2023 0605   CREATININE 1.05 04/18/2022 1034   CALCIUM 8.9 12/06/2023 0605   CALCIUM 9.6 11/17/2013 0621   PROT 6.7 12/04/2023 1732   PROT 8.5 (H) 01/10/2013 2155   ALBUMIN 3.2 (L) 12/04/2023 1732   ALBUMIN 4.0 01/10/2013 2155   AST 20 12/04/2023 1732   AST 16 01/10/2013 2155   ALT 15 12/04/2023 1732   ALT 26 01/10/2013 2155   ALKPHOS 65 12/04/2023 1732   ALKPHOS 91 01/10/2013 2155   BILITOT 1.0 12/04/2023 1732   BILITOT 0.3 01/10/2013 2155   EGFR 78 04/18/2022 1034   GFRNONAA >60 12/06/2023 0605   GFRNONAA >60 11/17/2013 0621     VAS [place] ABI WITH/WO TBI Result Date: 12/12/2023  LOWER EXTREMITY DOPPLER STUDY Patient Name:  WILBON OBENCHAIN  Date of Exam:   12/10/2023 Medical Rec #: [id number]         Accession #:    [id number] Date of Birth: [birth year]         Patient Gender: M Patient Age:   69 years Exam Location:  [facility] Procedure:      VAS [place] ABI WITH/WO TBI Referring Phys: Barbara Cower DEW --------------------------------------------------------------------------------  Indications: Peripheral artery disease. High Risk Factors: Hypertension, Diabetes, past history of smoking.  Vascular Interventions: 12/05/2023 Stent right peroneal and profunda arteries                          11/05/2023 PTA tib per trunk,  peroneal artery, and                         distal fem to tib per bpg.                          06/26/2023 PTA of tib per trunk and mid fem to TP trunk                         bypass graft on the right.                          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 Performing Technologist: Hardie Lora RVT  Examination Guidelines: A complete evaluation includes at minimum, Doppler waveform signals and systolic blood pressure reading at the level of bilateral brachial, anterior tibial, and posterior tibial arteries, when vessel segments are accessible. Bilateral testing is considered an integral part of a complete examination. Photoelectric Plethysmograph (PPG) waveforms and toe systolic pressure readings are included as required and additional duplex testing as needed. Limited examinations for reoccurring indications may be performed as noted.  ABI Findings: +---------+------------------+-----+----------+--------+ Right    Rt Pressure (mmHg)IndexWaveform  Comment  +---------+------------------+-----+----------+--------+ Brachial 145                                       +---------+------------------+-----+----------+--------+ PTA      0                 0.00                    +---------+------------------+-----+----------+--------+ PERO     109               0.74 monophasic         +---------+------------------+-----+----------+--------+ DP       104               0.70 monophasic         +---------+------------------+-----+----------+--------+ Great Toe104               0.70                     +---------+------------------+-----+----------+--------+ +--------+------------------+-----+--------+-------+ Left    Lt Pressure (mmHg)IndexWaveformComment +--------+------------------+-----+--------+-------+ [id number]                                    +--------+------------------+-----+--------+-------+ +-------+-----------+-----------+------------+------------+ ABI/TBIToday's ABIToday's TBIPrevious ABIPrevious TBI +-------+-----------+-----------+------------+------------+ Right  0.74       0.70       0.84        0.73         +-------+-----------+-----------+------------+------------+ Left   BKA                   BKA                      +-------+-----------+-----------+------------+------------+  Right ABIs appear decreased compared to prior study on 08/20/2023.  Summary: Right: Resting right ankle-brachial index indicates moderate right lower extremity arterial disease. The right toe-brachial index is abnormal. *See table(s) above for measurements and observations.  Electronically signed by Festus Barren MD on 12/12/2023 at 8:28:04 AM.    Final  Assessment & Plan:   1. Atherosclerosis of native arteries of the extremities with ulceration (HCC) (Primary) The patient is post angiogram at this time.  He just underwent his procedure several days ago and so currently no further plans for intervention at this time but will maintain close follow-up and have him return in 4 weeks. - VAS [place] ABI WITH/WO TBI; Future - VAS [place] LOWER EXTREMITY ARTERIAL DUPLEX; Future  2. Essential hypertension Continue antihypertensive medications as already ordered, these medications have been reviewed and there are no changes at this time.  3. Mixed hyperlipidemia Continue statin as ordered and reviewed, no changes at this time   Current Outpatient Medications on File Prior to Visit  Medication Sig Dispense Refill   ascorbic acid (VITAMIN C) 500 MG tablet Take 1 tablet (500 mg total) by  mouth 2 (two) times daily. 60 tablet 0   aspirin EC 81 MG tablet Take 81 mg by mouth daily. Swallow whole.     atorvastatin (LIPITOR) 80 MG tablet Take 1 tablet (80 mg total) by mouth daily at 6 PM. 30 tablet 0   Cholecalciferol (VITAMIN D3) 125 MCG (5000 UT) CAPS Take 1 capsule by mouth daily.     ELIQUIS 5 MG TABS tablet Take 5 mg by mouth 2 (two) times daily.     gabapentin (NEURONTIN) 100 MG capsule Take 100 mg by mouth 3 (three) times daily.     HUMALOG KWIKPEN 100 UNIT/ML KwikPen Inject 18 Units into the skin 3 (three) times daily.     insulin glargine (LANTUS) 100 UNIT/ML Solostar Pen Inject 41 Units into the skin 2 (two) times daily. (Patient taking differently: Inject 52 Units into the skin at bedtime.) 15 mL 0   losartan (COZAAR) 25 MG tablet Take 1 tablet (25 mg total) by mouth daily. 30 tablet 0   nicotine (NICODERM CQ - DOSED IN MG/24 HOURS) 21 mg/24hr patch Place 1 patch (21 mg total) onto the skin daily. 30 patch 0   TRULICITY 1.5 MG/0.5ML SOAJ Inject 1.5 mg into the skin once a week.     Zinc Sulfate 220 (50 Zn) MG TABS Take 1 tablet (220 mg total) by mouth daily after supper. 30 tablet 0   oxyCODONE-acetaminophen (PERCOCET) 5-325 MG tablet Take 1 tablet by mouth every 4 (four) hours as needed for severe pain (pain score 7-10). (Patient not taking: Reported on 12/04/2023) 30 tablet 0   No current facility-administered medications on file prior to visit.    There are no Patient Instructions on file for this visit. No follow-ups on file.   Georgiana Spinner, NP

## 2023-12-23 ENCOUNTER — Ambulatory Visit (INDEPENDENT_AMBULATORY_CARE_PROVIDER_SITE_OTHER): Payer: 59 | Admitting: Podiatry

## 2023-12-23 DIAGNOSIS — Z89421 Acquired absence of other right toe(s): Secondary | ICD-10-CM | POA: Diagnosis not present

## 2023-12-23 NOTE — Progress Notes (Signed)
 v  Subjective:  Patient ID: Austin [name], male    DOB: 09/09/1956,  MRN: [id number]  Chief Complaint  Patient presents with   Routine Post Op    dos 1/30/ right 4th toe amputation Wound check     DOS: 11/06/2023 Procedure: Right fourth digit amputation  68 y.o. male returns for post-op check.  He states is doing well.  Bandages intact.  No drainage noted.  No strikethrough noted.  Ambulating with surgical shoe  Review of Systems: Negative except as noted in the HPI. Denies N/V/F/Ch.  Past Medical History:  Diagnosis Date   Coronary artery disease    Diabetes (HCC)    DVT (deep venous thrombosis) (HCC)    Gangrene of left foot (HCC) 02/09/2018   GERD (gastroesophageal reflux disease)    Hyperlipidemia    Hypertension    Peripheral vascular disease (HCC)     Current Outpatient Medications:    ascorbic acid (VITAMIN C) 500 MG tablet, Take 1 tablet (500 mg total) by mouth 2 (two) times daily., Disp: 60 tablet, Rfl: 0   aspirin EC 81 MG tablet, Take 81 mg by mouth daily. Swallow whole., Disp: , Rfl:    atorvastatin (LIPITOR) 80 MG tablet, Take 1 tablet (80 mg total) by mouth daily at 6 PM., Disp: 30 tablet, Rfl: 0   Cholecalciferol (VITAMIN D3) 125 MCG (5000 UT) CAPS, Take 1 capsule by mouth daily., Disp: , Rfl:    clopidogrel (PLAVIX) 75 MG tablet, Take 1 tablet (75 mg total) by mouth daily at 6 (six) AM., Disp: 60 tablet, Rfl: 6   ELIQUIS 5 MG TABS tablet, Take 5 mg by mouth 2 (two) times daily., Disp: , Rfl:    gabapentin (NEURONTIN) 100 MG capsule, Take 100 mg by mouth 3 (three) times daily., Disp: , Rfl:    HUMALOG KWIKPEN 100 UNIT/ML KwikPen, Inject 18 Units into the skin 3 (three) times daily., Disp: , Rfl:    insulin glargine (LANTUS) 100 UNIT/ML Solostar Pen, Inject 41 Units into the skin 2 (two) times daily. (Patient taking differently: Inject 52 Units into the skin at bedtime.), Disp: 15 mL, Rfl: 0   losartan (COZAAR) 25 MG tablet, Take 1 tablet (25 mg total) by mouth  daily., Disp: 30 tablet, Rfl: 0   nicotine (NICODERM CQ - DOSED IN MG/24 HOURS) 21 mg/24hr patch, Place 1 patch (21 mg total) onto the skin daily., Disp: 30 patch, Rfl: 0   oxyCODONE-acetaminophen (PERCOCET) 5-325 MG tablet, Take 1 tablet by mouth every 4 (four) hours as needed for severe pain (pain score 7-10). (Patient not taking: Reported on 12/04/2023), Disp: 30 tablet, Rfl: 0   TRULICITY 1.5 MG/0.5ML SOAJ, Inject 1.5 mg into the skin once a week., Disp: , Rfl:    Zinc Sulfate 220 (50 Zn) MG TABS, Take 1 tablet (220 mg total) by mouth daily after supper., Disp: 30 tablet, Rfl: 0  Social History   Tobacco Use  Smoking Status Former   Current packs/day: 0.00   Average packs/day: 1 pack/day for 47.0 years (47.0 ttl pk-yrs)   Types: Cigarettes   Start date: 01/1974   Quit date: 01/2021   Years since quitting: 2.9  Smokeless Tobacco Never    No Known Allergies Objective:  There were no vitals filed for this visit. There is no height or weight on file to calculate BMI. Constitutional Well developed. Well nourished.  Vascular Foot warm and well perfused. Capillary refill normal to all digits.   Neurologic Normal speech. Oriented  to person, place, and time. Epicritic sensation to light touch grossly present bilaterally.  Dermatologic Superficial wound dehiscence noted.  No deep wound noted.  No drainage noted.  No signs of infection noted.  Orthopedic: Tenderness to palpation noted about the surgical site.   Radiographs: 3 views of skeletally mature the right foot: Fourth digit amputation noted.  No other bony abnormalities identified no osteomyelitis noted.  History of partial fifth ray amputation noted Assessment:   No diagnosis found.  Plan:  Patient was evaluated and treated and all questions answered.  S/p foot surgery  right -Progressing as expected post-operatively. -XR:  see above -WB Status:  weightbearing as tolerated in surgical shoe -Sutures: None -Medications:  None -Superficial dehiscence noted.  Continue Betadine wet-to-dry dressing changes once a day. -  No follow-ups on file.

## 2024-01-20 ENCOUNTER — Ambulatory Visit (INDEPENDENT_AMBULATORY_CARE_PROVIDER_SITE_OTHER): Admitting: Podiatry

## 2024-01-20 DIAGNOSIS — Z89421 Acquired absence of other right toe(s): Secondary | ICD-10-CM | POA: Diagnosis not present

## 2024-01-20 NOTE — Progress Notes (Signed)
 v  Subjective:  Patient ID: Austin [name], male    DOB: 08-Jan-1956,  MRN: [id number]  Chief Complaint  Patient presents with   Routine Post Op    DOS: 11/06/2023 Procedure: Right fourth digit amputation  68 y.o. male returns for post-op check.  This is doing a lot better he has not been dressing it looks like he has healed up denies any other acute issues  Review of Systems: Negative except as noted in the HPI. Denies N/V/F/Ch.  Past Medical History:  Diagnosis Date   Coronary artery disease    Diabetes (HCC)    DVT (deep venous thrombosis) (HCC)    Gangrene of left foot (HCC) 02/09/2018   GERD (gastroesophageal reflux disease)    Hyperlipidemia    Hypertension    Peripheral vascular disease (HCC)     Current Outpatient Medications:    ascorbic acid (VITAMIN C) 500 MG tablet, Take 1 tablet (500 mg total) by mouth 2 (two) times daily., Disp: 60 tablet, Rfl: 0   aspirin EC 81 MG tablet, Take 81 mg by mouth daily. Swallow whole., Disp: , Rfl:    atorvastatin (LIPITOR) 80 MG tablet, Take 1 tablet (80 mg total) by mouth daily at 6 PM., Disp: 30 tablet, Rfl: 0   Cholecalciferol (VITAMIN D3) 125 MCG (5000 UT) CAPS, Take 1 capsule by mouth daily., Disp: , Rfl:    clopidogrel (PLAVIX) 75 MG tablet, Take 1 tablet (75 mg total) by mouth daily at 6 (six) AM., Disp: 60 tablet, Rfl: 6   ELIQUIS 5 MG TABS tablet, Take 5 mg by mouth 2 (two) times daily., Disp: , Rfl:    gabapentin (NEURONTIN) 100 MG capsule, Take 100 mg by mouth 3 (three) times daily., Disp: , Rfl:    HUMALOG KWIKPEN 100 UNIT/ML KwikPen, Inject 18 Units into the skin 3 (three) times daily., Disp: , Rfl:    insulin glargine (LANTUS) 100 UNIT/ML Solostar Pen, Inject 41 Units into the skin 2 (two) times daily. (Patient taking differently: Inject 52 Units into the skin at bedtime.), Disp: 15 mL, Rfl: 0   losartan (COZAAR) 25 MG tablet, Take 1 tablet (25 mg total) by mouth daily., Disp: 30 tablet, Rfl: 0   nicotine (NICODERM CQ -  DOSED IN MG/24 HOURS) 21 mg/24hr patch, Place 1 patch (21 mg total) onto the skin daily., Disp: 30 patch, Rfl: 0   oxyCODONE-acetaminophen (PERCOCET) 5-325 MG tablet, Take 1 tablet by mouth every 4 (four) hours as needed for severe pain (pain score 7-10). (Patient not taking: Reported on 12/04/2023), Disp: 30 tablet, Rfl: 0   TRULICITY 1.5 MG/0.5ML SOAJ, Inject 1.5 mg into the skin once a week., Disp: , Rfl:    Zinc Sulfate 220 (50 Zn) MG TABS, Take 1 tablet (220 mg total) by mouth daily after supper., Disp: 30 tablet, Rfl: 0  Social History   Tobacco Use  Smoking Status Former   Current packs/day: 0.00   Average packs/day: 1 pack/day for 47.0 years (47.0 ttl pk-yrs)   Types: Cigarettes   Start date: 01/1974   Quit date: 01/2021   Years since quitting: 3.0  Smokeless Tobacco Never    No Known Allergies Objective:  There were no vitals filed for this visit. There is no height or weight on file to calculate BMI. Constitutional Well developed. Well nourished.  Vascular Foot warm and well perfused. Capillary refill normal to all digits.   Neurologic Normal speech. Oriented to person, place, and time. Epicritic sensation to light touch  grossly present bilaterally.  Dermatologic Superficial wound dehiscence noted.  No deep wound noted.  No drainage noted.  No signs of infection noted.  Orthopedic: Tenderness to palpation noted about the surgical site.   Radiographs: 3 views of skeletally mature the right foot: Fourth digit amputation noted.  No other bony abnormalities identified no osteomyelitis noted.  History of partial fifth ray amputation noted Assessment:   No diagnosis found.  Plan:  Patient was evaluated and treated and all questions answered.  S/p foot surgery  right - Clinically healed and officially discharged from my care at this time no further signs of wound noted no complication noted I discussed with him prevention technique if any foot and ankle issues arise in the  future he will come back and see me. -  No follow-ups on file.

## 2024-01-21 ENCOUNTER — Encounter (INDEPENDENT_AMBULATORY_CARE_PROVIDER_SITE_OTHER)

## 2024-01-21 ENCOUNTER — Ambulatory Visit (INDEPENDENT_AMBULATORY_CARE_PROVIDER_SITE_OTHER): Admitting: Nurse Practitioner

## 2024-02-29 ENCOUNTER — Inpatient Hospital Stay
Admission: EM | Admit: 2024-02-29 | Discharge: 2024-03-02 | DRG: 558 | Disposition: A | Discharge status: Home Health | Attending: Internal Medicine | Admitting: Internal Medicine

## 2024-02-29 ENCOUNTER — Other Ambulatory Visit: Payer: Self-pay

## 2024-02-29 ENCOUNTER — Emergency Department

## 2024-02-29 DIAGNOSIS — Z6829 Body mass index (BMI) 29.0-29.9, adult: Secondary | ICD-10-CM

## 2024-02-29 DIAGNOSIS — I739 Peripheral vascular disease, unspecified: Secondary | ICD-10-CM

## 2024-02-29 DIAGNOSIS — Z86718 Personal history of other venous thrombosis and embolism: Secondary | ICD-10-CM

## 2024-02-29 DIAGNOSIS — N3000 Acute cystitis without hematuria: Secondary | ICD-10-CM

## 2024-02-29 DIAGNOSIS — Z806 Family history of leukemia: Secondary | ICD-10-CM

## 2024-02-29 DIAGNOSIS — R748 Abnormal levels of other serum enzymes: Secondary | ICD-10-CM

## 2024-02-29 DIAGNOSIS — E663 Overweight: Secondary | ICD-10-CM

## 2024-02-29 DIAGNOSIS — Z7985 Long-term (current) use of injectable non-insulin antidiabetic drugs: Secondary | ICD-10-CM

## 2024-02-29 DIAGNOSIS — Z794 Long term (current) use of insulin: Secondary | ICD-10-CM

## 2024-02-29 DIAGNOSIS — Z89512 Acquired absence of left leg below knee: Secondary | ICD-10-CM

## 2024-02-29 DIAGNOSIS — N3001 Acute cystitis with hematuria: Secondary | ICD-10-CM | POA: Diagnosis present

## 2024-02-29 DIAGNOSIS — I5032 Chronic diastolic (congestive) heart failure: Secondary | ICD-10-CM | POA: Diagnosis present

## 2024-02-29 DIAGNOSIS — Z7902 Long term (current) use of antithrombotics/antiplatelets: Secondary | ICD-10-CM

## 2024-02-29 DIAGNOSIS — Z7901 Long term (current) use of anticoagulants: Secondary | ICD-10-CM

## 2024-02-29 DIAGNOSIS — Z833 Family history of diabetes mellitus: Secondary | ICD-10-CM

## 2024-02-29 DIAGNOSIS — I10 Essential (primary) hypertension: Secondary | ICD-10-CM

## 2024-02-29 DIAGNOSIS — Z87891 Personal history of nicotine dependence: Secondary | ICD-10-CM

## 2024-02-29 DIAGNOSIS — Z79899 Other long term (current) drug therapy: Secondary | ICD-10-CM

## 2024-02-29 DIAGNOSIS — E785 Hyperlipidemia, unspecified: Secondary | ICD-10-CM | POA: Diagnosis not present

## 2024-02-29 DIAGNOSIS — I110 Hypertensive heart disease with heart failure: Secondary | ICD-10-CM | POA: Diagnosis present

## 2024-02-29 DIAGNOSIS — I2510 Atherosclerotic heart disease of native coronary artery without angina pectoris: Secondary | ICD-10-CM | POA: Diagnosis present

## 2024-02-29 DIAGNOSIS — M6282 Rhabdomyolysis: Principal | ICD-10-CM

## 2024-02-29 DIAGNOSIS — Z89421 Acquired absence of other right toe(s): Secondary | ICD-10-CM

## 2024-02-29 DIAGNOSIS — Y92009 Unspecified place in unspecified non-institutional (private) residence as the place of occurrence of the external cause: Secondary | ICD-10-CM

## 2024-02-29 DIAGNOSIS — Z8249 Family history of ischemic heart disease and other diseases of the circulatory system: Secondary | ICD-10-CM

## 2024-02-29 DIAGNOSIS — E1165 Type 2 diabetes mellitus with hyperglycemia: Secondary | ICD-10-CM | POA: Diagnosis present

## 2024-02-29 DIAGNOSIS — Z7982 Long term (current) use of aspirin: Secondary | ICD-10-CM

## 2024-02-29 DIAGNOSIS — K219 Gastro-esophageal reflux disease without esophagitis: Secondary | ICD-10-CM | POA: Diagnosis present

## 2024-02-29 DIAGNOSIS — N390 Urinary tract infection, site not specified: Principal | ICD-10-CM | POA: Diagnosis present

## 2024-02-29 DIAGNOSIS — W050XXA Fall from non-moving wheelchair, initial encounter: Secondary | ICD-10-CM | POA: Diagnosis present

## 2024-02-29 DIAGNOSIS — W19XXXA Unspecified fall, initial encounter: Secondary | ICD-10-CM

## 2024-02-29 DIAGNOSIS — E1151 Type 2 diabetes mellitus with diabetic peripheral angiopathy without gangrene: Secondary | ICD-10-CM | POA: Diagnosis present

## 2024-02-29 DIAGNOSIS — B9620 Unspecified Escherichia coli [E. coli] as the cause of diseases classified elsewhere: Secondary | ICD-10-CM | POA: Diagnosis present

## 2024-02-29 DIAGNOSIS — I482 Chronic atrial fibrillation, unspecified: Secondary | ICD-10-CM

## 2024-02-29 HISTORY — DX: Unspecified atrial fibrillation: I48.91

## 2024-02-29 LAB — GLUCOSE, CAPILLARY: Glucose-Capillary: 223 mg/dL — ABNORMAL HIGH (ref 70–99)

## 2024-02-29 LAB — URINALYSIS, ROUTINE W REFLEX MICROSCOPIC
Bilirubin Urine: NEGATIVE
Glucose, UA: 50 mg/dL — AB
Ketones, ur: NEGATIVE mg/dL
Nitrite: NEGATIVE
Protein, ur: 100 mg/dL — AB
Specific Gravity, Urine: 1.016 (ref 1.005–1.030)
WBC, UA: >50 WBC/hpf (ref 0–5)
pH: 5 (ref 5.0–8.0)

## 2024-02-29 LAB — COMPREHENSIVE METABOLIC PANEL WITH GFR
ALT: 21 U/L (ref 0–44)
AST: 30 U/L (ref 15–41)
Albumin: 3.6 g/dL (ref 3.5–5.0)
Alkaline Phosphatase: 76 U/L (ref 38–126)
Anion gap: 14 (ref 5–15)
BUN: 18 mg/dL (ref 8–23)
CO2: 20 mmol/L — ABNORMAL LOW (ref 22–32)
Calcium: 9 mg/dL (ref 8.9–10.3)
Chloride: 101 mmol/L (ref 98–111)
Creatinine, Ser: 1 mg/dL (ref 0.61–1.24)
GFR, Estimated: >60 mL/min (ref >60)
Glucose, Bld: 227 mg/dL — ABNORMAL HIGH (ref 70–99)
Potassium: 3.8 mmol/L (ref 3.5–5.1)
Sodium: 135 mmol/L (ref 135–145)
Total Bilirubin: 1.6 mg/dL — ABNORMAL HIGH (ref 0.0–1.2)
Total Protein: 8.1 g/dL (ref 6.5–8.1)

## 2024-02-29 LAB — CBC
HCT: 45 % (ref 39.0–52.0)
Hemoglobin: 14.6 g/dL (ref 13.0–17.0)
MCH: 26.9 pg (ref 26.0–34.0)
MCHC: 32.4 g/dL (ref 30.0–36.0)
MCV: 83 fL (ref 80.0–100.0)
Platelets: 182 10*3/uL (ref 150–400)
RBC: 5.42 MIL/uL (ref 4.22–5.81)
RDW: 15.8 % — ABNORMAL HIGH (ref 11.5–15.5)
WBC: 23.4 10*3/uL — ABNORMAL HIGH (ref 4.0–10.5)
nRBC: 0 % (ref 0.0–0.2)

## 2024-02-29 LAB — BRAIN NATRIURETIC PEPTIDE: B Natriuretic Peptide: 271.6 pg/mL — ABNORMAL HIGH (ref 0.0–100.0)

## 2024-02-29 LAB — CK: Total CK: 1421 U/L — ABNORMAL HIGH (ref 49–397)

## 2024-02-29 LAB — TROPONIN I (HIGH SENSITIVITY)
Troponin I (High Sensitivity): 21 ng/L — ABNORMAL HIGH (ref <18)
Troponin I (High Sensitivity): 27 ng/L — ABNORMAL HIGH (ref <18)
Troponin I (High Sensitivity): 19 ng/L — ABNORMAL HIGH (ref <18)

## 2024-02-29 LAB — CBG MONITORING, ED: Glucose-Capillary: 201 mg/dL — ABNORMAL HIGH (ref 70–99)

## 2024-02-29 MED ORDER — VITAMIN C 500 MG PO TABS
500.0000 mg | ORAL_TABLET | Freq: Two times a day (BID) | ORAL | Status: DC
  Administered 2024-03-01 – 2024-03-02 (×3): 500 mg via ORAL
  Filled 2024-02-29 (×3): qty 1

## 2024-02-29 MED ORDER — SODIUM CHLORIDE 0.9 % IV SOLN
1.0000 g | Freq: Once | INTRAVENOUS | Status: AC
  Administered 2024-02-29: 1 g via INTRAVENOUS
  Filled 2024-02-29: qty 10

## 2024-02-29 MED ORDER — VITAMIN D 25 MCG (1000 UNIT) PO TABS
1000.0000 [IU] | ORAL_TABLET | Freq: Every day | ORAL | Status: DC
  Administered 2024-02-29 – 2024-03-02 (×3): 1000 [IU] via ORAL
  Filled 2024-02-29 (×3): qty 1

## 2024-02-29 MED ORDER — GABAPENTIN 100 MG PO CAPS
100.0000 mg | ORAL_CAPSULE | Freq: Three times a day (TID) | ORAL | Status: DC
  Administered 2024-02-29 – 2024-03-02 (×5): 100 mg via ORAL
  Filled 2024-02-29 (×5): qty 1

## 2024-02-29 MED ORDER — SODIUM CHLORIDE 0.9 % IV BOLUS
500.0000 mL | Freq: Once | INTRAVENOUS | Status: AC
  Administered 2024-02-29: 500 mL via INTRAVENOUS

## 2024-02-29 MED ORDER — INSULIN ASPART 100 UNIT/ML IJ SOLN
0.0000 [IU] | Freq: Three times a day (TID) | INTRAMUSCULAR | Status: DC
  Administered 2024-03-01: 5 [IU] via SUBCUTANEOUS
  Administered 2024-03-01 (×2): 7 [IU] via SUBCUTANEOUS
  Administered 2024-03-02: 2 [IU] via SUBCUTANEOUS
  Filled 2024-02-29 (×4): qty 1

## 2024-02-29 MED ORDER — CLOPIDOGREL BISULFATE 75 MG PO TABS
75.0000 mg | ORAL_TABLET | Freq: Every day | ORAL | Status: DC
  Administered 2024-03-01 – 2024-03-02 (×2): 75 mg via ORAL
  Filled 2024-02-29 (×3): qty 1

## 2024-02-29 MED ORDER — SODIUM CHLORIDE 0.9 % IV SOLN
2.0000 g | INTRAVENOUS | Status: DC
  Administered 2024-03-01: 2 g via INTRAVENOUS
  Filled 2024-02-29: qty 20

## 2024-02-29 MED ORDER — ASPIRIN 81 MG PO TBEC
81.0000 mg | DELAYED_RELEASE_TABLET | Freq: Every day | ORAL | Status: DC
  Administered 2024-02-29 – 2024-03-01 (×2): 81 mg via ORAL
  Filled 2024-02-29 (×2): qty 1

## 2024-02-29 MED ORDER — APIXABAN 5 MG PO TABS
5.0000 mg | ORAL_TABLET | Freq: Two times a day (BID) | ORAL | Status: DC
  Administered 2024-02-29 – 2024-03-02 (×4): 5 mg via ORAL
  Filled 2024-02-29 (×4): qty 1

## 2024-02-29 MED ORDER — ACETAMINOPHEN 325 MG PO TABS: 650.0000 mg | ORAL_TABLET | Freq: Four times a day (QID) | ORAL | Status: DC | PRN

## 2024-02-29 MED ORDER — LOSARTAN POTASSIUM 25 MG PO TABS
25.0000 mg | ORAL_TABLET | Freq: Every day | ORAL | Status: DC
  Administered 2024-03-01 – 2024-03-02 (×2): 25 mg via ORAL
  Filled 2024-02-29 (×2): qty 1

## 2024-02-29 MED ORDER — INSULIN ASPART 100 UNIT/ML IJ SOLN
0.0000 [IU] | Freq: Every day | INTRAMUSCULAR | Status: DC
Start: 2024-02-29 — End: 2024-03-02
  Administered 2024-02-29: 2 [IU] via SUBCUTANEOUS
  Administered 2024-03-01: 4 [IU] via SUBCUTANEOUS
  Filled 2024-02-29 (×2): qty 1

## 2024-02-29 MED ORDER — SODIUM CHLORIDE 0.9 % IV SOLN: INTRAVENOUS | Status: DC

## 2024-02-29 MED ORDER — INSULIN GLARGINE-YFGN 100 UNIT/ML ~~LOC~~ SOLN
35.0000 [IU] | Freq: Every day | SUBCUTANEOUS | Status: DC
  Administered 2024-02-29: 35 [IU] via SUBCUTANEOUS
  Filled 2024-02-29 (×2): qty 0.35

## 2024-02-29 MED ORDER — ATORVASTATIN CALCIUM 80 MG PO TABS
80.0000 mg | ORAL_TABLET | Freq: Every day | ORAL | Status: DC
  Administered 2024-03-01: 80 mg via ORAL
  Filled 2024-02-29: qty 1

## 2024-02-29 MED ORDER — HYDRALAZINE HCL 20 MG/ML IJ SOLN: 5.0000 mg | INTRAMUSCULAR | Status: DC | PRN

## 2024-02-29 MED ORDER — ONDANSETRON HCL 4 MG/2ML IJ SOLN: 4.0000 mg | Freq: Three times a day (TID) | INTRAMUSCULAR | Status: DC | PRN

## 2024-02-29 NOTE — H&P (Signed)
 History and Physical    Austin [name] ZOX:[id number] DOB: 11-20-1955 DOA: 02/29/2024  Referring MD/NP/PA:   PCP: [place], [name] T, MD   Patient coming from:  The patient is coming from home.     Chief Complaint: fall, dysuria  HPI: Austin [name] is a 68 y.o. male with medical history significant of PVD (s/p of right common femoral to TP trunk bypass with vein on 02/25/2022), HTN, HLD,m DM, CAD, dCHF, DVT and A fib on Eliquis , s/p of left BKA, s/p of third finger in left hand, former smoker, s/p of right 5th toe, who presents with fall and dysuria.  Pt states that he was trying to get out of his wheelchair to go into bed, did not have his prosthetic leg on, fell to the ground.  States that next thing he knew it was morning and he was still on the ground. Pt does not remember what happened in between but called his sister because he cannot get off the floor.  He complains of pain in sacral area. He states that in the past 2 days he has dysuria and burning on urination, no hematuria.  No nausea, vomiting, diarrhea or abdominal pain.  No chest pain, cough, SOB.  No fever or chills.  No unilateral numbness or tingling in extremities.  No facial droop or slurred speech.  He took his Eliquis  yesterday.   Data reviewed independently and ED Course: pt was found to have WBC 23.4, CK14 21, troponin 21 --> 27, BNP 276, positive UA (cloudy appearance, moderate amount of leukocyte, many bacteria, WBC > 50), GFR --> 60, temperature normal, blood pressure 129/94, heart rate of 103, RR 20, oxygen saturation 99% on room air.  X-ray of his sacrum is negative for bony fracture.  Chest x-ray negative.  CT of head negative for acute intracranial abnormalities.  CT of C-spine showed degenerative disc disease without acute injury.  Chest x-ray negative for infiltration.  Patient is placed in telemetry bed for observation.  CT of C-spine: 1. No acute fracture or subluxation of the cervical spine. 2. Posterior  decompression from C2 through C7. 3. Multilevel degenerative disc disease and facet hypertrophy.   EKG: I have personally reviewed.  A-fib, QTc 430, poor IV progression, LAD.   Review of Systems:   General: no fevers, chills, no body weight gain, has fatigue HEENT: no blurry vision, hearing changes or sore throat Respiratory: no dyspnea, coughing, wheezing CV: no chest pain, no palpitations GI: no nausea, vomiting, abdominal pain, diarrhea, constipation GU: has dysuria, burning on urination, increased urinary frequency, no hematuria  Ext: no leg edema Neuro: no unilateral weakness, numbness, or tingling, no vision change or hearing loss. Has fall.  Skin: no rash, no skin tear. MSK: No muscle spasm, no deformity, no limitation of range of movement in spin Heme: No easy bruising.  Travel history: No recent long distant travel.   Allergy: No Known Allergies  Past Medical History:  Diagnosis Date   Atrial fibrillation (HCC)    Coronary artery disease    Diabetes (HCC)    DVT (deep venous thrombosis) (HCC)    Gangrene of left foot (HCC) 02/09/2018   GERD (gastroesophageal reflux disease)    Hyperlipidemia    Hypertension    Peripheral vascular disease (HCC)     Past Surgical History:  Procedure Laterality Date   ABDOMINAL AORTOGRAM W/LOWER EXTREMITY N/A 02/20/2022   Procedure: ABDOMINAL AORTOGRAM W/LOWER EXTREMITY;  Surgeon: Young Hensen, MD;  Location: [facility] INVASIVE CV  LAB;  Service: Cardiovascular;  Laterality: N/A;   AMPUTATION Left 10/28/2018   Procedure: AMPUTATION BELOW KNEE;  Surgeon: Celso College, MD;  Location: [facility] ORS;  Service: Vascular;  Laterality: Left;   AMPUTATION Left 09/10/2021   Procedure: AMPUTATION DIGIT;  Surgeon: Barb Bonito, MD;  Location: [facility] ORS;  Service: Plastics;  Laterality: Left;   AMPUTATION Left 09/14/2021   Procedure: AMPUTATION DIGIT left long finger and irrigation and debridement;  Surgeon: Barb Bonito, MD;  Location: [facility] ORS;   Service: Plastics;  Laterality: Left;   AMPUTATION Right 02/27/2022   Procedure: RIGHT PARTIAL AMPUTATION FOOT;  Surgeon: Dot Gazella, DPM;  Location: [facility] OR;  Service: Podiatry;  Laterality: Right;   AMPUTATION TOE Left 02/10/2018   Procedure: AMPUTATION TOE;  Surgeon: Angel Barba, DPM;  Location: [facility] ORS;  Service: Podiatry;  Laterality: Left;   AMPUTATION TOE Right 11/06/2023   Procedure: Right 4th Toe Amputation;  Surgeon: Evertt Hoe, DPM;  Location: [facility] ORS;  Service: Orthopedics/Podiatry;  Laterality: Right;   APPLICATION OF WOUND VAC Left 10/16/2018   Procedure: APPLICATION OF WOUND VAC;  Surgeon: Anell Baptist, DPM;  Location: [facility] ORS;  Service: Podiatry;  Laterality: Left;   DORSAL SLIT N/A 01/12/2021   Procedure: DORSAL SLIT;  Surgeon: Lawerence Pressman, MD;  Location: [facility] ORS;  Service: Urology;  Laterality: N/A;   FEMORAL-TIBIAL BYPASS GRAFT Right 02/25/2022   Procedure: RIGHT LOWER EXTREMITY FEMORAL TO TIBIAL PERONEAL TRUNK BYPASS;  Surgeon: Young Hensen, MD;  Location: [facility] OR;  Service: Vascular;  Laterality: Right;  INSERT ARTERIAL LINE   groin surgery     IRRIGATION AND DEBRIDEMENT FOOT Left 09/30/2018   Procedure: IRRIGATION AND DEBRIDEMENT FOOT;  Surgeon: Angel Barba, DPM;  Location: [facility] ORS;  Service: Podiatry;  Laterality: Left;   LOWER EXTREMITY ANGIOGRAPHY Left 02/12/2018   Procedure: Lower Extremity Angiography;  Surgeon: Celso College, MD;  Location: [facility] INVASIVE CV LAB;  Service: Cardiovascular;  Laterality: Left;   LOWER EXTREMITY ANGIOGRAPHY Left 10/01/2018   Procedure: Lower Extremity Angiography;  Surgeon: Celso College, MD;  Location: [facility] INVASIVE CV LAB;  Service: Cardiovascular;  Laterality: Left;   LOWER EXTREMITY ANGIOGRAPHY Left 10/19/2018   Procedure: Lower Extremity Angiography;  Surgeon: Celso College, MD;  Location: [facility] INVASIVE CV LAB;  Service: Cardiovascular;  Laterality: Left;   LOWER EXTREMITY ANGIOGRAPHY Left 10/20/2018   Procedure:  LOWER EXTREMITY ANGIOGRAPHY;  Surgeon: Celso College, MD;  Location: [facility] INVASIVE CV LAB;  Service: Cardiovascular;  Laterality: Left;   LOWER EXTREMITY ANGIOGRAPHY Right 11/25/2018   Procedure: LOWER EXTREMITY ANGIOGRAPHY;  Surgeon: Celso College, MD;  Location: [facility] INVASIVE CV LAB;  Service: Cardiovascular;  Laterality: Right;   LOWER EXTREMITY ANGIOGRAPHY Right 01/10/2020   Procedure: LOWER EXTREMITY ANGIOGRAPHY;  Surgeon: Celso College, MD;  Location: [facility] INVASIVE CV LAB;  Service: Cardiovascular;  Laterality: Right;   LOWER EXTREMITY ANGIOGRAPHY Right 12/11/2020   Procedure: LOWER EXTREMITY ANGIOGRAPHY;  Surgeon: Celso College, MD;  Location: [facility] INVASIVE CV LAB;  Service: Cardiovascular;  Laterality: Right;   LOWER EXTREMITY ANGIOGRAPHY Right 06/26/2023   Procedure: Lower Extremity Angiography;  Surgeon: Jackquelyn Mass, MD;  Location: [facility] INVASIVE CV LAB;  Service: Cardiovascular;  Laterality: Right;   LOWER EXTREMITY ANGIOGRAPHY Right 11/05/2023   Procedure: Lower Extremity Angiography;  Surgeon: Celso College, MD;  Location: [facility] INVASIVE CV LAB;  Service: Cardiovascular;  Laterality: Right;   LOWER EXTREMITY ANGIOGRAPHY Right 12/05/2023   Procedure: Lower  Extremity Angiography;  Surgeon: Jackquelyn Mass, MD;  Location: [facility] INVASIVE CV LAB;  Service: Cardiovascular;  Laterality: Right;   LOWER EXTREMITY INTERVENTION  02/12/2018   Procedure: LOWER EXTREMITY INTERVENTION;  Surgeon: Celso College, MD;  Location: [facility] INVASIVE CV LAB;  Service: Cardiovascular;;   MINOR IRRIGATION AND DEBRIDEMENT OF WOUND Left 09/10/2021   Procedure: IRRIGATION AND DEBRIDEMENT OF LEFT LONG FINGER WOUND;  Surgeon: Barb Bonito, MD;  Location: [facility]S;  Service: Plastics;  Laterality: Left;   NECK SURGERY     PERIPHERAL VASCULAR INTERVENTION  02/20/2022   Procedure: PERIPHERAL VASCULAR INTERVENTION;  Surgeon: Young Hensen, MD;  Location: [facility] INVASIVE CV LAB;  Service: Cardiovascular;;  right iliac    TRANSMETATARSAL AMPUTATION Left 10/16/2018   Procedure: TRANSMETATARSAL AMPUTATION LEFT FOOT;  Surgeon: Anell Baptist, DPM;  Location: [facility] ORS;  Service: Podiatry;  Laterality: Left;   VEIN HARVEST Right 02/25/2022   Procedure: VEIN HARVEST OF RIGHT GREATER SAPHENOUS VEIN;  Surgeon: Young Hensen, MD;  Location: [facility] OR;  Service: Vascular;  Laterality: Right;    Social History:  reports that he quit smoking about 3 years ago. His smoking use included cigarettes. He started smoking about 50 years ago. He has a 47 pack-year smoking history. He has never used smokeless tobacco. He reports that he does not drink alcohol and does not use drugs.  Family History:  Family History  Problem Relation Age of Onset   Diabetes Mother    Coronary artery disease Father    Hypertension Sister    Leukemia Brother      Prior to Admission medications   Medication Sig Start Date End Date Taking? Authorizing Provider  ascorbic acid  (VITAMIN C ) 500 MG tablet Take 1 tablet (500 mg total) by mouth 2 (two) times daily. 03/21/22   Love, Renay Carota, PA-C  aspirin  EC 81 MG tablet Take 81 mg by mouth daily. Swallow whole.    [provider]  atorvastatin  (LIPITOR ) 80 MG tablet Take 1 tablet (80 mg total) by mouth daily at 6 PM. 03/21/22   Love, Renay Carota, PA-C  Cholecalciferol  (VITAMIN D3) 125 MCG (5000 UT) CAPS Take 1 capsule by mouth daily. 04/17/22   [provider]  clopidogrel  (PLAVIX ) 75 MG tablet Take 1 tablet (75 mg total) by mouth daily at 6 (six) AM. 12/10/23   Brown, Fallon E, NP  ELIQUIS  5 MG TABS tablet Take 5 mg by mouth 2 (two) times daily. 10/21/23   [provider]  gabapentin  (NEURONTIN ) 100 MG capsule Take 100 mg by mouth 3 (three) times daily.    [provider]  HUMALOG  KWIKPEN 100 UNIT/ML KwikPen Inject 18 Units into the skin 3 (three) times daily. 10/02/23   [provider]  insulin  glargine (LANTUS ) 100 UNIT/ML Solostar Pen Inject 41 Units into the skin  2 (two) times daily. Patient taking differently: Inject 52 Units into the skin at bedtime. 03/21/22   Love, Renay Carota, PA-C  losartan  (COZAAR ) 25 MG tablet Take 1 tablet (25 mg total) by mouth daily. 03/21/22   Love, Renay Carota, PA-C  nicotine  (NICODERM CQ  - DOSED IN MG/24 HOURS) 21 mg/24hr patch Place 1 patch (21 mg total) onto the skin daily. 12/06/23 12/05/24  Lorita Rosa, MD  oxyCODONE -acetaminophen  (PERCOCET) 5-325 MG tablet Take 1 tablet by mouth every 4 (four) hours as needed for severe pain (pain score 7-10). Patient not taking: Reported on 12/04/2023 12/02/23   Velma Ghazi, DPM  TRULICITY 1.5 MG/0.5ML [facility]  Inject 1.5 mg into the skin once a week. 11/12/23   [provider]  Zinc  Sulfate 220 (50 Zn) MG TABS Take 1 tablet (220 mg total) by mouth daily after supper. 03/21/22   Zelda Hickman, PA-C    Physical Exam: Vitals:   02/29/24 1435 02/29/24 1838 02/29/24 1855 02/29/24 2040  BP: (!) 129/94  139/77 132/63  Pulse:   91 90  Resp:   17 18  Temp:  98.3 F (36.8 C) 98 F (36.7 C) 98 F (36.7 C)  TempSrc:  Oral    SpO2:   99% 98%  Weight:      Height:       General: Not in acute distress HEENT:       Eyes: PERRL, EOMI, no jaundice       ENT: No discharge from the ears and nose, no pharynx injection, no tonsillar enlargement.        Neck: No JVD, no bruit, no mass felt. Heme: No neck lymph node enlargement. Cardiac: S1/S2, RRR, No murmurs, No gallops or rubs. Respiratory: No rales, wheezing, rhonchi or rubs. GI: Soft, nondistended, nontender, no rebound pain, no organomegaly, BS present. GU: No hematuria Ext: No pitting leg edema bilaterally.  S/p of left BKA  Musculoskeletal: No joint deformities, No joint redness or warmth, no limitation of ROM in spin. Skin: No rashes.  Neuro: Alert, oriented X3, cranial nerves II-XII grossly intact, moves all extremities normally.  Psych: Patient is not psychotic, no suicidal or hemocidal ideation.  Labs on Admission: I have  personally reviewed following labs and imaging studies  CBC: Recent Labs  Lab 02/29/24 1501  WBC 23.4*  HGB 14.6  HCT 45.0  MCV 83.0  PLT 182   Basic Metabolic Panel: Recent Labs  Lab 02/29/24 1501  NA 135  K 3.8  CL 101  CO2 20*  GLUCOSE 227*  BUN 18  CREATININE 1.00  CALCIUM  9.0   GFR: Estimated Creatinine Clearance: 95.6 mL/min (by C-G formula based on SCr of 1 mg/dL). Liver Function Tests: Recent Labs  Lab 02/29/24 1501  AST 30  ALT 21  ALKPHOS 76  BILITOT 1.6*  PROT 8.1  ALBUMIN 3.6   No results for input(s): "LIPASE", "AMYLASE" in the last 168 hours. No results for input(s): "AMMONIA" in the last 168 hours. Coagulation Profile: No results for input(s): "INR", "PROTIME" in the last 168 hours. Cardiac Enzymes: Recent Labs  Lab 02/29/24 1501  CKTOTAL 1,421*   BNP (last 3 results) No results for input(s): "PROBNP" in the last 8760 hours. HbA1C: No results for input(s): "HGBA1C" in the last 72 hours. CBG: Recent Labs  Lab 02/29/24 1606 02/29/24 2208  GLUCAP 201* 223*   Lipid Profile: No results for input(s): "CHOL", "HDL", "LDLCALC", "TRIG", "CHOLHDL", "LDLDIRECT" in the last 72 hours. Thyroid Function Tests: No results for input(s): "TSH", "T4TOTAL", "FREET4", "T3FREE", "THYROIDAB" in the last 72 hours. Anemia Panel: No results for input(s): "VITAMINB12", "FOLATE", "FERRITIN", "TIBC", "IRON", "RETICCTPCT" in the last 72 hours. Urine analysis:    Component Value Date/Time   COLORURINE YELLOW (A) 02/29/2024 1702   APPEARANCEUR CLOUDY (A) 02/29/2024 1702   APPEARANCEUR Cloudy (A) 11/20/2020 1611   LABSPEC 1.016 02/29/2024 1702   LABSPEC 1.024 11/12/2013 2001   PHURINE 5.0 02/29/2024 1702   GLUCOSEU 50 (A) 02/29/2024 1702   GLUCOSEU >=500 11/12/2013 2001   HGBUR MODERATE (A) 02/29/2024 1702   BILIRUBINUR NEGATIVE 02/29/2024 1702   BILIRUBINUR Negative 11/20/2020 1611   BILIRUBINUR Negative 11/12/2013  2001   KETONESUR NEGATIVE 02/29/2024  1702   PROTEINUR 100 (A) 02/29/2024 1702   NITRITE NEGATIVE 02/29/2024 1702   LEUKOCYTESUR MODERATE (A) 02/29/2024 1702   LEUKOCYTESUR Negative 11/12/2013 2001   Sepsis Labs: @LABRCNTIP (procalcitonin:4,lacticidven:4) )No results found for this or any previous visit (from the past 240 hours).   Radiological Exams on Admission:   Assessment/Plan Principal Problem:   Rhabdomyolysis Active Problems:   UTI (urinary tract infection)   Chronic diastolic CHF (congestive heart failure) (HCC)   Essential hypertension   Hyperlipidemia   Coronary artery disease   DM (diabetes mellitus), type 2 with peripheral vascular complications (HCC)   Peripheral vascular disease (HCC)   Atrial fibrillation, chronic (HCC)   Fall at home, initial encounter   Overweight (BMI 25.0-29.9)   Assessment and Plan:   Rhabdomyolysis: CK level 1421.  Renal function okay, GFR> 60.  -Previously intended plan for outpatient - IV fluid: 500 cc normal saline, Naima 75 cc/h (patient has history of diastolic CHF, limiting aggressive IV fluid use) - Repeat CK level in the morning  UTI (urinary tract infection): -IV Rocephin  - Follow-up urine culture  Chronic diastolic CHF (congestive heart failure) (HCC): 2D echo on 11/29/2015 showed EF of 50% with grade 1 diastolic dysfunction.  BNP 276, but patient does not have leg edema or SOB.  CHF seem to be compensated. -Wash volume status closely  Essential hypertension: -IV hydralazine  as needed - Cozaar   Hyperlipidemia -Lipitor   Coronary artery disease -Lipitor , aspirin , Plavix   DM (diabetes mellitus), type 2 with peripheral vascular complications [facility]): Recent A1c 8.3, poorly controlled.  Patient said taking Trulicity, Humalog , Lantus  52 units daily -Sliding scale insulin  - Glargine insulin  35 units daily  Peripheral vascular disease (HCC): S/p of bypass -Aspirin , Plavix , Lipitor  - Patient is also on Eliquis  which is for A-fib  Atrial fibrillation,  chronic (HCC): Heart rate 103 -Continue Eliquis  - As needed IV metoprolol  2.5 mg every 2 hour for heart rate > 125  Fall at home, initial encounter: Image negative for acute injury -TOC for home health need -Fall precaution -PT/PT  Overweight (BMI 25.0-29.9): Body weight 108.9 kg, BMI 29.21 - Encourage losing weight - Exercise and healthy diet      DVT ppx: on Eliquis   Code Status: Full code   Family Communication:     not done, no family member is at bed side.    Disposition Plan:  Anticipate discharge back to previous environment  Consults called:  none  Admission status and Level of care: Telemetry Medical:    for obs    Dispo: The patient is from: Home              Anticipated d/c is to: Home              Anticipated d/c date is: 1 day              Patient currently is not medically stable to d/c.    Severity of Illness:  The appropriate patient status for this patient is OBSERVATION. Observation status is judged to be reasonable and necessary in order to provide the required intensity of service to ensure the patient's safety. [name] presenting symptoms, physical exam findings, and initial radiographic and laboratory data in the context of their medical condition is felt to place them at decreased risk for further clinical deterioration. Furthermore, it is anticipated that the patient will be medically stable for discharge from the hospital within 2 midnights of admission.  Date of Service 03/01/2024    Fidencio Hue [facility]   If 7PM-7AM, please contact night-coverage [email or web address] 03/01/2024, 12:42 AM

## 2024-02-29 NOTE — ED Provider Notes (Signed)
 [facility] Provider Note    Event Date/Time   First MD Initiated Contact with Patient 02/29/24 1516     (approximate)   History   Fall   HPI  Austin [name] is a 68 y.o. male patient with history of atrial fibrillation on Eliquis , history of DVT, CHF, hypertension, hyperlipidemia, diabetes, here for fall.  He had an unwitnessed fall yesterday night, patient states that he was trying to get out of his wheelchair to go into his bed, did not have his prosthetic leg on, fell to the ground.  States that next thing he knew it was morning and he was still on the ground.  Does not remember what happened in between but called his sister because he cannot get off the floor.  He does note some dysuria, denies any chest pain, shortness of breath, headache, new weakness or numbness.  Does state that he is sacrum is hurting from being on the floor.   On independent chart review, he was admitted in February of this year for critical limb ischemia to his right lower extremity, status post stenting by vascular surgery.  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  Physical Exam   Triage Vital Signs: ED Triage Vitals  Encounter Vitals Group     BP 02/29/24 1435 (!) 129/94     Systolic BP Percentile --      Diastolic BP Percentile --      Pulse Rate 02/29/24 1429 (!) 103     Resp 02/29/24 1429 20     Temp 02/29/24 1429 97.7 F (36.5 C)     Temp Source 02/29/24 1429 Oral     SpO2 02/29/24 1429 99 %     Weight 02/29/24 1429 240 lb (108.9 kg)     Height 02/29/24 1429 6\' 4"  (1.93 m)     Head Circumference --      Peak Flow --      Pain Score 02/29/24 1430 0     Pain Loc --      Pain Education --      Exclude from Growth Chart --     Most recent vital signs: Vitals:   02/29/24 1429 02/29/24 1435  BP:  (!) 129/94  Pulse: (!) 103   Resp: 20   Temp: 97.7 F (36.5 C)   SpO2: 99%      General: Awake, no distress.  CV:  Good peripheral perfusion.  Resp:  Normal effort.  No  thoracic cage tenderness Abd:  No distention.  Soft nontender Other:  No palpable skull deformities or tenderness, he does have mild tenderness to the right sacrum without overlying erythema or palpable deformity, no other midline spinal tenderness.  He has equal grip strength bilaterally, no focal weakness to his bilateral lower extremity, no sensory deficits.  He has a left BKA, he has an anterior area that has slight ulceration but no overlying erythema, ecchymoses, tenderness to palpation, purulent drainage, area is clean and dry.  Left lower extremity full range of motion is intact without bony tenderness.  Right lower extremity full range of motion is intact without tenderness to palpation, he does have palpable DP pulse on the right.  No erythema, purulent drainage, or open wounds to his right lower extremity   ED Results / Procedures / Treatments   Labs (all labs ordered are listed, but only abnormal results are displayed) Labs Reviewed  COMPREHENSIVE METABOLIC PANEL WITH GFR - Abnormal; Notable for the following components:  Result Value   CO2 20 (*)    Glucose, Bld 227 (*)    Total Bilirubin 1.6 (*)    All other components within normal limits  CBC - Abnormal; Notable for the following components:   WBC 23.4 (*)    RDW 15.8 (*)    All other components within normal limits  URINALYSIS, ROUTINE W REFLEX MICROSCOPIC - Abnormal; Notable for the following components:   Color, Urine YELLOW (*)    APPearance CLOUDY (*)    Glucose, UA 50 (*)    Hgb urine dipstick MODERATE (*)    Protein, ur 100 (*)    Leukocytes,Ua MODERATE (*)    Bacteria, UA MANY (*)    All other components within normal limits  CK - Abnormal; Notable for the following components:   Total CK 1,421 (*)    All other components within normal limits  CBG MONITORING, ED - Abnormal; Notable for the following components:   Glucose-Capillary 201 (*)    All other components within normal limits  TROPONIN I (HIGH  SENSITIVITY) - Abnormal; Notable for the following components:   Troponin I (High Sensitivity) 21 (*)    All other components within normal limits  TROPONIN I (HIGH SENSITIVITY) - Abnormal; Notable for the following components:   Troponin I (High Sensitivity) 27 (*)    All other components within normal limits  URINE CULTURE  BRAIN NATRIURETIC PEPTIDE     EKG  EKG shows, atrial fibrillation, rate 100, normal appearance, normal QTc, no ischemic ST elevation, T wave flattening in 3, V6, T wave flattening in 3 is new compared to prior   RADIOLOGY On my independent interpretation, CT head without obvious intracranial hemorrhage   PROCEDURES:  Critical Care performed: No  Procedures   MEDICATIONS ORDERED IN ED: Medications  ondansetron  (ZOFRAN ) injection 4 mg (has no administration in time range)  hydrALAZINE  (APRESOLINE ) injection 5 mg (has no administration in time range)  acetaminophen  (TYLENOL ) tablet 650 mg (has no administration in time range)  sodium chloride  0.9 % bolus 500 mL (500 mLs Intravenous New Bag/Given 02/29/24 1743)  cefTRIAXone  (ROCEPHIN ) 1 g in sodium chloride  0.9 % 100 mL IVPB (1 g Intravenous New Bag/Given 02/29/24 1800)     IMPRESSION / MDM / ASSESSMENT AND PLAN / ED COURSE  I reviewed the triage vital signs and the nursing notes.                              Differential diagnosis includes, but is not limited to, intracranial hemorrhage, fracture, contusion, strain, sprain, concussion, also consider electrolyte derangements, rhabdomyolysis, atypical ACS, UTI.  Labs, EKG, troponin, chest x-ray, UA, CT head, cervical spine.  Patient's presentation is most consistent with acute presentation with potential threat to life or bodily function.  Independent interpretation of labs and imaging below.  Given the leukocytosis and a UTI as well as the fall, patient lives at home alone, he will need to be admitted for further management and antibiotics.  Will give a  dose of IV antibiotics here.  He is also getting fluids for his mildly elevated CK.  Consult hospitalist was agreeable with the plan for admission and will evaluate the patient.  Shared decision making to patient about mission and he is agreeable with the plan.  Patient is admitted.  The patient is on the cardiac monitor to evaluate for evidence of arrhythmia and/or significant heart rate changes.   Clinical Course as  of 02/29/24 Florence Hunt Feb 29, 2024  1612 CT HEAD WO CONTRAST IMPRESSION: 1. No acute intracranial abnormality. No skull fracture. 2. Mild chronic small vessel ischemia.   [TT]  1615 CT Cervical Spine Wo Contrast IMPRESSION: 1. No acute fracture or subluxation of the cervical spine. 2. Posterior decompression from C2 through C7. 3. Multilevel degenerative disc disease and facet hypertrophy.   [TT]  1636 DG Sacrum/Coccyx 1. No acute displaced fracture.  [TT]  1636 DG Chest 1 View No active disease.  [TT]  1756 Urinalysis, Routine w reflex microscopic -Urine, Clean Catch(!) UA is consistent with UTI, will start him on IV ceftriaxone  here. [TT]  1757 Independent review of labs, he has leukocytosis, electrolyte severely deranged, creatinine is normal, his CK is mildly elevated, troponin is mildly elevated. [TT]    Clinical Course User Index [TT] Drenda Gentle Richard Champion, MD     FINAL CLINICAL IMPRESSION(S) / ED DIAGNOSES   Final diagnoses:  Fall, initial encounter  Urinary tract infection without hematuria, site unspecified  Elevated CK     Rx / DC Orders   ED Discharge Orders     None        Note:  This document was prepared using Dragon voice recognition software and may include unintentional dictation errors.    Shane Darling, MD 02/29/24 [phone]

## 2024-02-29 NOTE — ED Triage Notes (Signed)
 Pt to ED for unwitnessed fall last night, he thinks he fell out of wheelchair (walks some, has prosthetic leg, lives alone). States does not really remember how he fell, but had to call sister because could not get off the floor. States then he was in bed and he's not sure how he got there. Pt takes Plavix , Eliquis  and aspirin , hx a fib. Pt alert, oriented.

## 2024-02-29 NOTE — ED Notes (Signed)
 Advised nurse that patient has ready bed

## 2024-02-29 NOTE — ED Triage Notes (Signed)
 First Nurse Note:  Pt via [facility] from home. Pt c/o unwitnessed fall last night, doesn't remember the fall. Does not remember if he hit his head. Pt does take Eliquis . Denies any complaints at this time.  128/76 BP  112 HR  98% on RA Pt has hx

## 2024-03-01 DIAGNOSIS — N3001 Acute cystitis with hematuria: Secondary | ICD-10-CM

## 2024-03-01 DIAGNOSIS — T796XXS Traumatic ischemia of muscle, sequela: Secondary | ICD-10-CM | POA: Diagnosis not present

## 2024-03-01 DIAGNOSIS — K219 Gastro-esophageal reflux disease without esophagitis: Secondary | ICD-10-CM | POA: Diagnosis present

## 2024-03-01 DIAGNOSIS — E1165 Type 2 diabetes mellitus with hyperglycemia: Secondary | ICD-10-CM | POA: Diagnosis present

## 2024-03-01 DIAGNOSIS — Z79899 Other long term (current) drug therapy: Secondary | ICD-10-CM | POA: Diagnosis not present

## 2024-03-01 DIAGNOSIS — Z87891 Personal history of nicotine dependence: Secondary | ICD-10-CM | POA: Diagnosis not present

## 2024-03-01 DIAGNOSIS — I482 Chronic atrial fibrillation, unspecified: Secondary | ICD-10-CM

## 2024-03-01 DIAGNOSIS — Z806 Family history of leukemia: Secondary | ICD-10-CM | POA: Diagnosis not present

## 2024-03-01 DIAGNOSIS — E1151 Type 2 diabetes mellitus with diabetic peripheral angiopathy without gangrene: Secondary | ICD-10-CM | POA: Diagnosis present

## 2024-03-01 DIAGNOSIS — I2510 Atherosclerotic heart disease of native coronary artery without angina pectoris: Secondary | ICD-10-CM

## 2024-03-01 DIAGNOSIS — T796XXD Traumatic ischemia of muscle, subsequent encounter: Secondary | ICD-10-CM

## 2024-03-01 DIAGNOSIS — Z794 Long term (current) use of insulin: Secondary | ICD-10-CM | POA: Diagnosis not present

## 2024-03-01 DIAGNOSIS — Z89421 Acquired absence of other right toe(s): Secondary | ICD-10-CM | POA: Diagnosis not present

## 2024-03-01 DIAGNOSIS — Y92009 Unspecified place in unspecified non-institutional (private) residence as the place of occurrence of the external cause: Secondary | ICD-10-CM | POA: Diagnosis not present

## 2024-03-01 DIAGNOSIS — M6282 Rhabdomyolysis: Secondary | ICD-10-CM | POA: Diagnosis present

## 2024-03-01 DIAGNOSIS — E785 Hyperlipidemia, unspecified: Secondary | ICD-10-CM

## 2024-03-01 DIAGNOSIS — Z8249 Family history of ischemic heart disease and other diseases of the circulatory system: Secondary | ICD-10-CM | POA: Diagnosis not present

## 2024-03-01 DIAGNOSIS — Z833 Family history of diabetes mellitus: Secondary | ICD-10-CM | POA: Diagnosis not present

## 2024-03-01 DIAGNOSIS — W050XXA Fall from non-moving wheelchair, initial encounter: Secondary | ICD-10-CM | POA: Diagnosis present

## 2024-03-01 DIAGNOSIS — I5032 Chronic diastolic (congestive) heart failure: Secondary | ICD-10-CM | POA: Diagnosis present

## 2024-03-01 DIAGNOSIS — E663 Overweight: Secondary | ICD-10-CM | POA: Diagnosis present

## 2024-03-01 DIAGNOSIS — Z7901 Long term (current) use of anticoagulants: Secondary | ICD-10-CM | POA: Diagnosis not present

## 2024-03-01 DIAGNOSIS — Z7902 Long term (current) use of antithrombotics/antiplatelets: Secondary | ICD-10-CM | POA: Diagnosis not present

## 2024-03-01 DIAGNOSIS — I10 Essential (primary) hypertension: Secondary | ICD-10-CM

## 2024-03-01 DIAGNOSIS — I110 Hypertensive heart disease with heart failure: Secondary | ICD-10-CM | POA: Diagnosis present

## 2024-03-01 DIAGNOSIS — Z89512 Acquired absence of left leg below knee: Secondary | ICD-10-CM | POA: Diagnosis not present

## 2024-03-01 DIAGNOSIS — Z86718 Personal history of other venous thrombosis and embolism: Secondary | ICD-10-CM | POA: Diagnosis not present

## 2024-03-01 DIAGNOSIS — Z7982 Long term (current) use of aspirin: Secondary | ICD-10-CM | POA: Diagnosis not present

## 2024-03-01 DIAGNOSIS — B9620 Unspecified Escherichia coli [E. coli] as the cause of diseases classified elsewhere: Secondary | ICD-10-CM | POA: Diagnosis present

## 2024-03-01 LAB — CBC
HCT: 38.4 % — ABNORMAL LOW (ref 39.0–52.0)
Hemoglobin: 12.8 g/dL — ABNORMAL LOW (ref 13.0–17.0)
MCH: 27.5 pg (ref 26.0–34.0)
MCHC: 33.3 g/dL (ref 30.0–36.0)
MCV: 82.4 fL (ref 80.0–100.0)
Platelets: 167 10*3/uL (ref 150–400)
RBC: 4.66 MIL/uL (ref 4.22–5.81)
RDW: 15.8 % — ABNORMAL HIGH (ref 11.5–15.5)
WBC: 15.9 10*3/uL — ABNORMAL HIGH (ref 4.0–10.5)
nRBC: 0 % (ref 0.0–0.2)

## 2024-03-01 LAB — BASIC METABOLIC PANEL WITH GFR
Anion gap: 9 (ref 5–15)
BUN: 16 mg/dL (ref 8–23)
CO2: 25 mmol/L (ref 22–32)
Calcium: 8.3 mg/dL — ABNORMAL LOW (ref 8.9–10.3)
Chloride: 102 mmol/L (ref 98–111)
Creatinine, Ser: 0.87 mg/dL (ref 0.61–1.24)
GFR, Estimated: >60 mL/min (ref >60)
Glucose, Bld: 185 mg/dL — ABNORMAL HIGH (ref 70–99)
Potassium: 3.7 mmol/L (ref 3.5–5.1)
Sodium: 136 mmol/L (ref 135–145)

## 2024-03-01 LAB — GLUCOSE, CAPILLARY
Glucose-Capillary: 281 mg/dL — ABNORMAL HIGH (ref 70–99)
Glucose-Capillary: 331 mg/dL — ABNORMAL HIGH (ref 70–99)
Glucose-Capillary: 336 mg/dL — ABNORMAL HIGH (ref 70–99)
Glucose-Capillary: 283 mg/dL — ABNORMAL HIGH (ref 70–99)
Glucose-Capillary: 346 mg/dL — ABNORMAL HIGH (ref 70–99)

## 2024-03-01 LAB — CK: Total CK: 757 U/L — ABNORMAL HIGH (ref 49–397)

## 2024-03-01 MED ORDER — INSULIN GLARGINE-YFGN 100 UNIT/ML ~~LOC~~ SOLN
45.0000 [IU] | Freq: Every day | SUBCUTANEOUS | Status: DC
  Administered 2024-03-01: 45 [IU] via SUBCUTANEOUS
  Filled 2024-03-01 (×2): qty 0.45

## 2024-03-01 MED ORDER — METOPROLOL TARTRATE 5 MG/5ML IV SOLN: 2.5000 mg | INTRAVENOUS | Status: DC | PRN

## 2024-03-01 MED ORDER — SODIUM CHLORIDE 0.9 % IV SOLN: INTRAVENOUS | Status: DC

## 2024-03-01 MED ORDER — INSULIN ASPART 100 UNIT/ML IJ SOLN
6.0000 [IU] | Freq: Three times a day (TID) | INTRAMUSCULAR | Status: DC
  Administered 2024-03-01 – 2024-03-02 (×2): 6 [IU] via SUBCUTANEOUS
  Filled 2024-03-01 (×2): qty 1

## 2024-03-01 NOTE — Evaluation (Signed)
 Occupational Therapy Evaluation Patient Details Name: Austin [name] MRN: [id number] DOB: Apr 25, 1956 Today's Date: 03/01/2024   History of Present Illness   Pt is a 68 y/o M admitted on 02/29/24 after presenting with c/o a fall. Pt is being treated for rhabdomyolysis, UTI. PMH: a-fib on Eliquis , DVT, CHF, HTN, HLD, DM, L BKA, CAD, PVD     Clinical Impressions Austin [name] lives alone, has a ramped entrance, uses public transportation for getting around in the community, primarily uses a WC in and out of his home but uses a RW in his bathroom, as WC does not fit through the bathroom door. He receives Meals on Wheels M-F lunchtime and prepares his other meals. Pt is able to perform functional mobility tasks largely without physical assistance from the therapist; however, he is unsteady in standing and has to make repeated attempts to come into standing from a seated position, due to L LE stump slipping and sliding around in his prosthesis. Given pt's unsteadiness, he could benefit from daily rehab < 3 hours day post DC. Pt's stability and safety would be considerably improved with a properly fitting L LE prosthesis. Pt has been told he must go through multiple steps to make this happen (visit his PCP for referral, return to [facility] with paperwork), but this is difficult for him, given his impaired mobility and reliance on public transportation. Assistance to streamline this process if possible would be helpful. Pt will benefit from ongoing OT and mobility specialist care during his hospitalization.    If plan is discharge home, recommend the following:   A little help with walking and/or transfers;A little help with bathing/dressing/bathroom;Assistance with cooking/housework     Functional Status Assessment   Patient has had a recent decline in their functional status and demonstrates the ability to make significant improvements in function in a reasonable and predictable amount of time.      Equipment Recommendations   Other (comment) (Pt needs properly fitting L LE prosthetic)     Recommendations for Other Services         Precautions/Restrictions   Precautions Precautions: Fall Precaution/Restrictions Comments: LLE prosthesis Restrictions Weight Bearing Restrictions Per Provider Order: No     Mobility Bed Mobility Overal bed mobility: Modified Independent Bed Mobility: Supine to Sit                Transfers Overall transfer level: Needs assistance   Transfers: Sit to/from Stand, Bed to chair/wheelchair/BSC Sit to Stand: Contact guard assist          Lateral/Scoot Transfers: Contact guard assist General transfer comment: sit<>stand from recliner with pt demonstrating need to place feet underneath BOS, counts "1-2-3" prior to transfer, requires a little extra time to transition hands from chair armrests to RW but pt also limited by height difference & pt being so tall. Poorly fitting prosthesis makes transferring difficult, with pt's LE slipping and sliding in silicone liner as he attempts to come into standing.      Balance Overall balance assessment: Needs assistance Sitting-balance support: Feet supported, No upper extremity supported Sitting balance-Leahy Scale: Good     Standing balance support: Bilateral upper extremity supported, Reliant on assistive device for balance, During functional activity Standing balance-Leahy Scale: Fair                             ADL either performed or assessed with clinical judgement   ADL Overall ADL's : Needs assistance/impaired Eating/Feeding:  Independent                   Lower Body Dressing: Supervision/safety   Toilet Transfer: Contact guard assist;Rolling walker (2 wheels)                   Vision         Perception         Praxis         Pertinent Vitals/Pain Pain Assessment Pain Assessment: No/denies pain     Extremity/Trunk Assessment Upper  Extremity Assessment Upper Extremity Assessment: LUE deficits/detail LUE Deficits / Details: 2 fingers amputated   Lower Extremity Assessment Lower Extremity Assessment: LLE deficits/detail;Generalized weakness RLE Deficits / Details: hx of toe amputations (per pt report), pt with some bleeding/oozing around base of R great toe nail LLE Deficits / Details: hx of LLE BKA; poorly fitting prosthesis       Communication Communication Communication: No apparent difficulties   Cognition Arousal: Alert Behavior During Therapy: WFL for tasks assessed/performed                                 Following commands: Intact       Cueing  General Comments          Exercises Other Exercises Other Exercises: Educ re: falls prevention, importance of having mobile phone on person or other method of being able to contact people in case of emergency   Shoulder Instructions      Home Living Family/patient expects to be discharged to:: Private residence Living Arrangements: Alone Available Help at Discharge: Family;Available PRN/intermittently Type of Home: Mobile home Home Access: Ramped entrance     Home Layout: One level         Bathroom Toilet: Handicapped height Bathroom Accessibility: No   Home Equipment: Pharmacist, hospital (2 wheels);Wheelchair - manual;Hospital bed   Additional Comments: LLE prosthesis (does not fit appropriately, needs PCP to sign off on new perscription/prosthetic that [facility] has already assessed him for), receives meals on wheels each day at lunch, going to [facility] 2x/week for strengthening. States that there is a power WC on order for him.      Prior Functioning/Environment Prior Level of Function : Needs assist             Mobility Comments: Pt performs lateral scoots to/from w/c, has to walk into bathroom with LLE prosthesis & RW as his w/c will not fit in bathroom (reports he sometimes doesn't make it), reports only 1  fall in the past 6 months. Uses public transportation bus. ADLs Comments: Mod I for bathing, dressing, grooming, feeding. Reports he receives meals on wheels M-F for lunch, cooks his own dinner. Reports he sometimes doesn't make it to the bathroom in time but able to clean himself up if this occurs.    OT Problem List: Decreased strength;Decreased activity tolerance;Impaired balance (sitting and/or standing);Other (comment) (need for properly fitting prosthetic LE)   OT Treatment/Interventions: Self-care/ADL training;Patient/family education;Therapeutic exercise;Balance training;Therapeutic activities;DME and/or AE instruction;Cognitive remediation/compensation      OT Goals(Current goals can be found in the care plan section)   Acute Rehab OT Goals Patient Stated Goal: to have L LE working better, to get power WC OT Goal Formulation: With patient Time For Goal Achievement: 03/15/24 Potential to Achieve Goals: Good ADL Goals Pt Will Transfer to Toilet: with modified independence Pt Will Perform Tub/Shower Transfer: with modified independence Pt/caregiver  will Perform Home Exercise Program: Increased ROM;Increased strength;Independently   OT Frequency:       Co-evaluation              AM-PAC OT "6 Clicks" Daily Activity     Outcome Measure Help from another person eating meals?: None Help from another person taking care of personal grooming?: A Little Help from another person toileting, which includes using toliet, bedpan, or urinal?: A Little Help from another person bathing (including washing, rinsing, drying)?: A Little Help from another person to put on and taking off regular upper body clothing?: None Help from another person to put on and taking off regular lower body clothing?: A Little 6 Click Score: 20   End of Session Equipment Utilized During Treatment: Rolling walker (2 wheels)  Activity Tolerance: Patient tolerated treatment well Patient left: in chair;with  chair alarm set;with call bell/phone within reach  OT Visit Diagnosis: Unsteadiness on feet (R26.81);Muscle weakness (generalized) (M62.81)                Time: 9528-4132 OT Time Calculation (min): 20 min Charges:  OT General Charges $OT Visit: 1 Visit OT Evaluation $OT Eval Low Complexity: 1 Low OT Treatments $Self Care/Home Management : 8-22 mins Basil Boston, PhD, MS, OTR/L 03/01/24, 2:56 PM

## 2024-03-01 NOTE — Plan of Care (Signed)

## 2024-03-01 NOTE — Assessment & Plan Note (Signed)
 Continue Eliquis .

## 2024-03-01 NOTE — Assessment & Plan Note (Signed)
 And also hyperglycemia.  Increase long-acting insulin  up to 45 units.  Continue short acting insulin .

## 2024-03-01 NOTE — Assessment & Plan Note (Signed)
 Continue Cozaar

## 2024-03-01 NOTE — Assessment & Plan Note (Signed)
No signs of heart failure.  Last EF 50%.

## 2024-03-01 NOTE — Assessment & Plan Note (Signed)
 Continue Rocephin. Follow up urine culture

## 2024-03-01 NOTE — TOC Initial Note (Addendum)
 Transition of Care [facility]) - Initial/Assessment Note    Patient Details  Name: Austin [name] MRN: [id number] Date of Birth: [birth year]  [facility] CM/SW Contact:    Austin Docker, RN 03/01/2024, 4:19 PM  Clinical Narrative:                  CM to patient's room regarding TOC screening assessment. CM introduced case management role and discharge care planning process. Patient states lives alone, no pets. Patient states uses wheelchair and rolling walker. CM and patient discussed SNF recommendations. Patient declined SNF recommendations and prefers home health services.   CM [facility] to [name] regarding home health PT/OT/HHA. Per Austin [name], agency has accepted patient for home health services. CM alert to Austin [name] regarding patient declining SNF and accepting home health, [facility].  Expected Discharge Plan: Skilled Nursing Facility Barriers to Discharge: Continued Medical Work up   Patient Goals and CMS Choice   Home health PT/OT  Expected Discharge Plan and Services    SNF   Living arrangements for the past 2 months: Mobile Home                   Prior Living Arrangements/Services Living arrangements for the past 2 months: Mobile Home Lives with:: Self   Do you feel safe going back to the place where you live?: Yes      Need for Family Participation in Patient Care: Yes (Comment) Care giver support system in place?: Yes (comment) [name] home services: DME (Wheelchair and rolling walker) Criminal Activity/Legal Involvement Pertinent to [name] Situation/Hospitalization: No - Comment as needed  Activities of Daily Living   ADL Screening (condition at time of admission) Independently performs ADLs?: Yes (appropriate for developmental age) Is the patient deaf or have difficulty hearing?: No Does the patient have difficulty seeing, even when wearing glasses/contacts?: No Does the patient have difficulty concentrating,  remembering, or making decisions?: No  Permission Sought/Granted Permission sought to share information with : Case Manager, Family Supports Permission granted to share information with : Yes, Verbal Permission Granted  Share Information with NAME: Austin [name]/Austin [name]/Austin [name]/Austin [name] granted to share info w Relationship: Son/Sister/Sister/Niece  Permission granted to share info w Contact Information: yes  Emotional Assessment Appearance:: Appears stated age Attitude/Demeanor/Rapport: Engaged Affect (typically observed): Calm Orientation: : Oriented to Self, Oriented to Place, Oriented to  Time, Oriented to Situation   Psych Involvement: No (comment)  Admission diagnosis:  Rhabdomyolysis [M62.82] Elevated CK [R74.8] Fall, initial encounter [W19.XXXA] Urinary tract infection without hematuria, site unspecified [N39.0] Patient Active Problem List   Diagnosis Date Noted   Acute cystitis with hematuria 03/01/2024   Rhabdomyolysis 02/29/2024   Fall at home, initial encounter 02/29/2024   DM (diabetes mellitus), type 2 with peripheral vascular complications (HCC) 02/29/2024   Arterial occlusion 12/05/2023   Chronic diastolic CHF (congestive heart failure) (HCC) 12/04/2023   Toe gangrene (HCC) 11/03/2023   Overweight (BMI 25.0-29.9) 11/03/2023   Right leg pain 06/26/2023   Obesity (BMI 30-39.9) 06/25/2023   DVT (deep venous thrombosis) (HCC)    Critical limb ischemia of right lower extremity (HCC) 04/23/2022   Medication monitoring encounter 03/29/2022   Diabetic foot infection (HCC) 03/29/2022   Acute blood loss anemia 03/21/2022   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) 03/08/2022   Altered mental status    Osteomyelitis of fifth toe of right foot (HCC)  Diabetic foot ulcer associated with type 2 diabetes mellitus (HCC) 02/16/2022   Atrial fibrillation, chronic (HCC) 02/16/2022   Infection of left hand  09/06/2021   Pressure injury of coccygeal region, stage 1 09/06/2021   Coronary artery disease    Peripheral vascular disease (HCC)    Lower limb ulcer, calf (HCC) 07/13/2021   Acute osteomyelitis of right foot (HCC) 03/30/2021   Diabetes mellitus type 2, uncomplicated (HCC) 09/16/2019   Atherosclerosis of native arteries of the extremities with ulceration (HCC) 01/01/2019   Status post below-knee amputation (HCC) - left side 11/30/2018   Type 2 diabetes mellitus with diabetic peripheral angiopathy without gangrene (HCC) 10/14/2018   Malnutrition of moderate degree 10/02/2018   Foot ulcer (HCC) 09/29/2018   Atherosclerosis of native arteries of extremity with intermittent claudication (HCC) 05/25/2017   Essential hypertension 05/25/2017   Hyperlipidemia 05/25/2017   PCP:  [place], [name] T, MD Pharmacy:   [facility] [address], [place] - [address] [place] [address] Phone: [phone] Fax: [phone]  [facility] [address] [place] [address] Phone: [phone] Fax: [phone]  [id number] [place]ette Nestle, [place] - [address] AT [facility] [address] [place] [address] Phone: [phone] Fax: [phone]  [facility] - [place], [place] - [address] [place] [address] Phone: [phone] Fax: [phone]  [facility] [place], [place] - [address] [place] [address] Phone: [phone] Fax: [phone]  [facility] [address] [place] [address] Phone: [phone] Fax: [phone]  [facility] [address] [place] - [address] [place]) [place] [address] Phone: [phone] Fax: [phone]     Social Drivers of Health (SDOH) Social History: SDOH  Screenings   Food Insecurity: No Food Insecurity (02/29/2024)  Housing: Low Risk  (02/29/2024)  Transportation Needs: No Transportation Needs (02/29/2024)  Utilities: Not At Risk (02/29/2024)  Depression (PHQ2-9): Low Risk  (06/25/2022)  Social Connections: Moderately Isolated (02/29/2024)  Tobacco Use: Medium Risk (02/29/2024)   SDOH Interventions:     Readmission Risk Interventions    11/08/2023    3:14 PM  Readmission Risk Prevention Plan  Transportation Screening Complete  PCP or Specialist Appt within 5-7 Days Complete  Home Care Screening Complete  Medication Review (RN CM) Complete

## 2024-03-01 NOTE — Hospital Course (Signed)
 68 y.o. male with medical history significant of PVD (s/p of right common femoral to TP trunk bypass with vein on 02/25/2022), HTN, HLD,m DM, CAD, dCHF, DVT and A fib on Eliquis , s/p of left BKA, s/p of third finger in left hand, former smoker, s/p of right 5th toe, who presents with fall and dysuria.   Pt states that he was trying to get out of his wheelchair to go into bed, did not have his prosthetic leg on, fell to the ground.  States that next thing he knew it was morning and he was still on the ground. Pt does not remember what happened in between but called his sister because he cannot get off the floor.  He complains of pain in sacral area. He states that in the past 2 days he has dysuria and burning on urination, no hematuria.  No nausea, vomiting, diarrhea or abdominal pain.  No chest pain, cough, SOB.  No fever or chills.  No unilateral numbness or tingling in extremities.  No facial droop or slurred speech.  He took his Eliquis  yesterday.  5/26.  Patient admitted with rhabdomyolysis, fall and urinary infection.  Continue Rocephin  and antibiotics. 5/27.  Patient changed his mind and wants to go home with home health.  E. coli growing out of urine culture sensitive to Rocephin  and Keflex .  Antibiotics changed to Keflex  upon going home.

## 2024-03-01 NOTE — Assessment & Plan Note (Signed)
 CK trending better down to 757.  Initial CK 1421 on presentation.  Continue fluids.  Hold statin.

## 2024-03-01 NOTE — Assessment & Plan Note (Signed)
 Will drop off aspirin  since we do not need 3 blood thinners.  Discontinue Lipitor .

## 2024-03-01 NOTE — Assessment & Plan Note (Signed)
Hold Lipitor with rhabdomyolysis.

## 2024-03-01 NOTE — Evaluation (Signed)
 Physical Therapy Evaluation Patient Details Name: Austin [name] MRN: [id number] DOB: 01/03/1956 Today's Date: 03/01/2024  History of Present Illness  Pt is a 68 y/o M admitted on 02/29/24 after presenting with c/o a fall. Pt is being treated for rhabdomyolysis, UTI. PMH: a-fib on Eliquis , DVT, CHF, HTN, HLD, DM, L BKA, CAD, PVD  Clinical Impression  Pt seen for PT evaluation with pt agreeable to tx. Pt reports prior to admission he was living alone in a mobile home with ramped entry, primarily using w/c but has to use a RW to walk into bathroom as w/c won't fit. Pt reports he cooks his own dinners, notes only 1 fall in the past 6 months. On this date, pt is able to complete bed mobility with mod I, lateral scoot transfers with supervision, sit<>stand with CGA & ambulate short distance with RW & CGA<>min assist with impaired gait pattern is noted below. Pt does not seem terribly far from baseline but will recommend post acute rehab <3 hours/day as pt is currently unsafe to d/c home & ambulate mod I, and will likely have great difficulty meal prepping for himself. Will continue to follow pt acutely to progress mobility as able in hopes pt can d/c with HHPT with progress.        If plan is discharge home, recommend the following: A little help with walking and/or transfers;A little help with bathing/dressing/bathroom;Assistance with cooking/housework;Help with stairs or ramp for entrance;Assist for transportation   Can travel by private vehicle   Yes    Equipment Recommendations Other (comment) (pt needs new prosthetic)  Recommendations for Other Services       Functional Status Assessment Patient has had a recent decline in their functional status and demonstrates the ability to make significant improvements in function in a reasonable and predictable amount of time.     Precautions / Restrictions Precautions Precautions: Fall Precaution/Restrictions Comments: LLE  prosthesis Restrictions Weight Bearing Restrictions Per Provider Order: No      Mobility  Bed Mobility Overal bed mobility: Needs Assistance Bed Mobility: Supine to Sit     Supine to sit: Modified independent (Device/Increase time), HOB elevated, Used rails (extra time to transition from supine>sitting EOB on L side of bed)          Transfers Overall transfer level: Needs assistance   Transfers: Bed to chair/wheelchair/BSC, Sit to/from Stand Sit to Stand: Contact guard assist          Lateral/Scoot Transfers: Supervision (bed>drop arm recliner on L with supervision) General transfer comment: sit<>stand from recliner with pt demonstrating need to place feet underneath BOS, counts "1-2-3" prior to transfer, requires a little extra time to transition hands from chair armrests to RW but pt also limited by height difference & pt being so tall    Ambulation/Gait Ambulation/Gait assistance: Min assist, Contact guard assist Gait Distance (Feet): 10 Feet Assistive device: Rolling walker (2 wheels) Gait Pattern/deviations: Decreased dorsiflexion - right, Decreased step length - right, Decreased step length - left, Decreased stride length, Trunk flexed Gait velocity: decreased     General Gait Details: Pt ambulates in room with RW & CGA<>min assist with cuing to ambulate within base of AD vs pushing it slightly out in front, feet outside of walker when turning. Pt with decreased R foot clearance, absent R heel strike but pt reports he's walking close to his baseline.  Stairs            Wheelchair Mobility     Tilt Bed  Modified Rankin (Stroke Patients Only)       Balance Overall balance assessment: Needs assistance Sitting-balance support: Feet supported, No upper extremity supported Sitting balance-Leahy Scale: Good Sitting balance - Comments: able to retrieve item from floor without LOB   Standing balance support: Bilateral upper extremity supported, Reliant  on assistive device for balance, During functional activity Standing balance-Leahy Scale: Fair                               Pertinent Vitals/Pain Pain Assessment Pain Assessment: Faces Faces Pain Scale: Hurts a little bit Pain Location: back Pain Descriptors / Indicators: Discomfort Pain Intervention(s): Monitored during session    Home Living Family/patient expects to be discharged to:: Private residence Living Arrangements: Alone Available Help at Discharge: Family;Available PRN/intermittently Type of Home: Mobile home Home Access: Ramped entrance       Home Layout: One level Home Equipment: Pharmacist, hospital (2 wheels);Wheelchair - Careers adviser (comment);Hospital bed Additional Comments: LLE prosthesis (does not fit appropriately, needs PCP to sign off on new perscription/prosthetic that [facility] has already assessed him for), receives meals on wheels each day at lunch, going to [facility] 2x/week for strengthening    Prior Function Prior Level of Function : Needs assist             Mobility Comments: Pt performs lateral scoots to/from w/c, has to walk into bathroom with LLE prosthesis & RW as his w/c will not fit in bathroom (reports he sometimes doesn't make it), reports only 1 fall in the past 6 months. Uses public transportation bus. ADLs Comments: Mod I for bathing, dressing, grooming, feeding. Reports he receives meals on wheels M-F for lunch, cooks his own dinner. Reports he sometimes doesn't make it to the bathroom in time but able to clean himself up if this occurs.     Extremity/Trunk Assessment   Upper Extremity Assessment Upper Extremity Assessment: LUE deficits/detail LUE Deficits / Details: 2 fingers amputated    Lower Extremity Assessment Lower Extremity Assessment: LLE deficits/detail;Generalized weakness;RLE deficits/detail RLE Deficits / Details: hx of toe amputations (per pt report), pt with some bleeding/oozing around base of R  great toe nail LLE Deficits / Details: hx of LLE BKA    Cervical / Trunk Assessment Cervical / Trunk Assessment: Kyphotic  Communication   Communication Communication: No apparent difficulties    Cognition Arousal: Alert Behavior During Therapy: WFL for tasks assessed/performed   PT - Cognitive impairments: No family/caregiver present to determine baseline                       PT - Cognition Comments: Pt appears to have spilled urinal but pt doesn't seem to be aware. Follows commands throughout session, Oriented x4. Following commands: Intact       Cueing Cueing Techniques: Verbal cues     General Comments General comments (skin integrity, edema, etc.): Pt required assistance/min cuing to line up prosthetic liner, pt reports it usually takes him awhile at home. Pt's LLE prosthesis becoming unpinned when performing lateral scoot but pt reports this is 2/2 poor fit & needing a new one, reports it happens at home.    Exercises     Assessment/Plan    PT Assessment Patient needs continued PT services  PT Problem List Decreased strength;Decreased balance;Decreased mobility;Decreased activity tolerance;Decreased knowledge of use of DME;Decreased range of motion       PT Treatment Interventions DME instruction;Balance training;Modalities;Gait  training;Neuromuscular re-education;Therapeutic activities;Therapeutic exercise;Patient/family education;Functional mobility training;Wheelchair mobility training    PT Goals (Current goals can be found in the Care Plan section)  Acute Rehab PT Goals Patient Stated Goal: get better, go home PT Goal Formulation: With patient Time For Goal Achievement: 03/15/24 Potential to Achieve Goals: Good    Frequency Min 2X/week     Co-evaluation               AM-PAC PT "6 Clicks" Mobility  Outcome Measure Help needed turning from your back to your side while in a flat bed without using bedrails?: A Little Help needed moving  from lying on your back to sitting on the side of a flat bed without using bedrails?: A Little Help needed moving to and from a bed to a chair (including a wheelchair)?: A Little Help needed standing up from a chair using your arms (e.g., wheelchair or bedside chair)?: A Little Help needed to walk in hospital room?: A Little Help needed climbing 3-5 steps with a railing? : A Lot 6 Click Score: 17    End of Session   Activity Tolerance: Patient tolerated treatment well Patient left: in chair;with chair alarm set;with call bell/phone within reach Nurse Communication: Mobility status PT Visit Diagnosis: Muscle weakness (generalized) (M62.81);Unsteadiness on feet (R26.81);Other abnormalities of gait and mobility (R26.89);Difficulty in walking, not elsewhere classified (R26.2)    Time: 8119-1478 PT Time Calculation (min) (ACUTE ONLY): 26 min   Charges:   PT Evaluation $PT Eval Low Complexity: 1 Low   PT General Charges $$ ACUTE PT VISIT: 1 Visit         Emaline Handsome, PT, DPT 03/01/24, 9:58 AM   Venetta Gill 03/01/2024, 9:56 AM

## 2024-03-01 NOTE — Assessment & Plan Note (Signed)
 Continue Eliquis .  Continue Plavix .

## 2024-03-01 NOTE — Assessment & Plan Note (Signed)
 BMI 29.21

## 2024-03-01 NOTE — Care Management Obs Status (Signed)
 MEDICARE OBSERVATION STATUS NOTIFICATION   Patient Details  Name: OMEGA SLAGER MRN: [id number] Date of Birth: 1955-11-24   Medicare Observation Status Notification Given:  Yes    Lawrence Roldan W, CMA 03/01/2024, 10:42 AM

## 2024-03-01 NOTE — Inpatient Diabetes Management (Signed)
 Inpatient Diabetes Program Recommendations  AACE/ADA: New Consensus Statement on Inpatient Glycemic Control (2015)  Target Ranges:  Prepandial:   less than 140 mg/dL      Peak postprandial:   less than 180 mg/dL (1-2 hours)      Critically ill patients:  140 - 180 mg/dL    Latest Reference Range & Units 02/29/24 16:06 02/29/24 22:08 03/01/24 07:44 03/01/24 11:31  Glucose-Capillary 70 - 99 mg/dL 621 (H) 308 (H)  2 units Novolog   35 units Semglee  281 (H)  5 units Novolog   331 (H)  7 units Novolog    (H): Data is abnormally high   Admit with: Rhabdomyolysis/ UTI  History: DM2, CHF  Home DM Meds: Humalog  18 units TID       Lantus  52 units at HS       Trulicity 1.5 mg Qweek  Current Orders: Semglee  35 units at HS     Novolog  Sensitive Correction Scale/ SSI (0-9 units) TID AC + HS     MD- Please consider:  1. Increase Semglee  to 45 units at HS  2. Start Novolog  Meal Coverage: Novolog  6 units TID with meals (1/3 total home dose) HOLD if pt NPO HOLD if pt eats <50% meals    --Will follow patient during hospitalization--  Langston Pippins RN, MSN, CDCES Diabetes Coordinator Inpatient Glycemic Control Team Team Pager: [phone] (8a-5p)

## 2024-03-01 NOTE — Progress Notes (Signed)
 Progress Note   Patient: Austin [name]:[id number] DOB: Nov 21, 1955 DOA: 02/29/2024     0 DOS: the patient was seen and examined on 03/01/2024   Brief hospital course: 68 y.o. male with medical history significant of PVD (s/p of right common femoral to TP trunk bypass with vein on 02/25/2022), HTN, HLD,m DM, CAD, dCHF, DVT and A fib on Eliquis , s/p of left BKA, s/p of third finger in left hand, former smoker, s/p of right 5th toe, who presents with fall and dysuria.   Pt states that he was trying to get out of his wheelchair to go into bed, did not have his prosthetic leg on, fell to the ground.  States that next thing he knew it was morning and he was still on the ground. Pt does not remember what happened in between but called his sister because he cannot get off the floor.  He complains of pain in sacral area. He states that in the past 2 days he has dysuria and burning on urination, no hematuria.  No nausea, vomiting, diarrhea or abdominal pain.  No chest pain, cough, SOB.  No fever or chills.  No unilateral numbness or tingling in extremities.  No facial droop or slurred speech.  He took his Eliquis  yesterday.  5/26.  Patient admitted with rhabdomyolysis, fall and urinary infection.  Continue Rocephin  and antibiotics.  Assessment and Plan: * Rhabdomyolysis CK trending better down to 757.  Initial CK 1421 on presentation.  Continue fluids.  Hold statin.  Acute cystitis with hematuria Continue Rocephin .  Follow-up urine culture  Chronic diastolic CHF (congestive heart failure) (HCC) No signs of heart failure.  Watch closely with fluids.  Last EF 50%.  Hyperlipidemia Hold Lipitor  with rhabdomyolysis  Essential hypertension Continue Cozaar   DM (diabetes mellitus), type 2 with peripheral vascular complications (HCC) And also hyperglycemia.  Increase long-acting insulin  up to 45 units.  Continue short acting insulin .  Coronary artery disease Will drop off aspirin  since we do not  need 3 blood thinners.  Discontinue Lipitor .  Peripheral vascular disease (HCC) Continue Eliquis .  Continue Plavix .  Atrial fibrillation, chronic (HCC) Continue Eliquis   Overweight (BMI 25.0-29.9) BMI 29.21       Subjective: Patient had a fall at home.  When he woke up he was on the ground and had trouble getting up.  Called his sister and she called her daughter who came over.  Patient brought in and was found to have rhabdomyolysis.  Patient also complained of burning on urination and he urinated while he was on the floor.  Physical Exam: Vitals:   02/29/24 1855 02/29/24 2040 03/01/24 0323 03/01/24 0742  BP: 139/77 132/63 129/80 129/79  Pulse: 91 90 79 61  Resp: 17 18 18 16   Temp: 98 F (36.7 C) 98 F (36.7 C) 97.7 F (36.5 C) 97.9 F (36.6 C)  TempSrc:      SpO2: 99% 98% 98% 100%  Weight:      Height:       Physical Exam HENT:     Head: Normocephalic.  Eyes:     General: Lids are normal.  Cardiovascular:     Rate and Rhythm: Normal rate and regular rhythm.     Heart sounds: Normal heart sounds, S1 normal and S2 normal.  Pulmonary:     Breath sounds: No decreased breath sounds, wheezing, rhonchi or rales.  Abdominal:     Palpations: Abdomen is soft.     Tenderness: There is no abdominal tenderness.  Musculoskeletal:     Right lower leg: No swelling.     Left Lower Extremity: Left leg is amputated below knee.  Skin:    General: Skin is warm.     Findings: No rash.  Neurological:     Mental Status: He is alert and oriented to person, place, and time.     Data Reviewed: Creatinine 0.87, CK7 57, white blood count 15.9, hemoglobin 12.8, platelet count 167  Family Communication: Tried to reach son  Disposition: Status is: Changed to inpatient.  Continue IV fluids.  Physical therapy recommending rehab  Planned Discharge Destination: rehab    Time spent: 28 minutes  Author: Verla Glaze, MD 03/01/2024 2:04 PM  For on call review [email or web address].

## 2024-03-02 DIAGNOSIS — E785 Hyperlipidemia, unspecified: Secondary | ICD-10-CM | POA: Diagnosis not present

## 2024-03-02 DIAGNOSIS — T796XXS Traumatic ischemia of muscle, sequela: Secondary | ICD-10-CM | POA: Diagnosis not present

## 2024-03-02 DIAGNOSIS — I739 Peripheral vascular disease, unspecified: Secondary | ICD-10-CM

## 2024-03-02 DIAGNOSIS — N3001 Acute cystitis with hematuria: Secondary | ICD-10-CM | POA: Diagnosis not present

## 2024-03-02 DIAGNOSIS — I5032 Chronic diastolic (congestive) heart failure: Secondary | ICD-10-CM | POA: Diagnosis not present

## 2024-03-02 LAB — BASIC METABOLIC PANEL WITH GFR
Anion gap: 9 (ref 5–15)
BUN: 13 mg/dL (ref 8–23)
CO2: 26 mmol/L (ref 22–32)
Calcium: 8.3 mg/dL — ABNORMAL LOW (ref 8.9–10.3)
Chloride: 105 mmol/L (ref 98–111)
Creatinine, Ser: 0.78 mg/dL (ref 0.61–1.24)
GFR, Estimated: >60 mL/min (ref >60)
Glucose, Bld: 170 mg/dL — ABNORMAL HIGH (ref 70–99)
Potassium: 3.7 mmol/L (ref 3.5–5.1)
Sodium: 140 mmol/L (ref 135–145)

## 2024-03-02 LAB — GLUCOSE, CAPILLARY
Glucose-Capillary: 164 mg/dL — ABNORMAL HIGH (ref 70–99)
Glucose-Capillary: 217 mg/dL — ABNORMAL HIGH (ref 70–99)

## 2024-03-02 LAB — URINE CULTURE: Culture: >=100000 — AB

## 2024-03-02 LAB — CK: Total CK: 275 U/L (ref 49–397)

## 2024-03-02 MED ORDER — ACETAMINOPHEN 325 MG PO TABS: 650.0000 mg | ORAL_TABLET | Freq: Four times a day (QID) | ORAL | Status: AC | PRN

## 2024-03-02 MED ORDER — HUMALOG KWIKPEN 100 UNIT/ML ~~LOC~~ SOPN: 12.0000 [IU] | PEN_INJECTOR | Freq: Three times a day (TID) | SUBCUTANEOUS | Status: DC

## 2024-03-02 MED ORDER — CEPHALEXIN 500 MG PO CAPS: 500.0000 mg | ORAL_CAPSULE | Freq: Three times a day (TID) | ORAL | Status: DC

## 2024-03-02 MED ORDER — INSULIN GLARGINE 100 UNIT/ML SOLOSTAR PEN: 45.0000 [IU] | PEN_INJECTOR | Freq: Every day | SUBCUTANEOUS | Status: DC

## 2024-03-02 MED ORDER — CEPHALEXIN 500 MG PO CAPS
500.0000 mg | ORAL_CAPSULE | Freq: Three times a day (TID) | ORAL | 0 refills | Status: AC
Start: 2024-03-02 — End: 2024-03-05

## 2024-03-02 NOTE — Discharge Summary (Signed)
 Physician Discharge Summary   Patient: Austin [name] MRN: [id number] DOB: 1956/02/24  Admit date:     02/29/2024  Discharge date: 03/02/24  Discharge Physician: Verla Glaze   PCP: [place], [name] T, MD   Recommendations at discharge:   Follow-up PCP 5 days Need to follow-up at the [facility] clinic to get a better fitting prosthesis.  Discharge Diagnoses: Principal Problem:   Rhabdomyolysis Active Problems:   Acute cystitis with hematuria   Chronic diastolic CHF (congestive heart failure) (HCC)   Essential hypertension   Hyperlipidemia   Coronary artery disease   DM (diabetes mellitus), type 2 with peripheral vascular complications (HCC)   Peripheral vascular disease (HCC)   Atrial fibrillation, chronic (HCC)   Fall at home, initial encounter   Overweight (BMI 25.0-29.9)    Hospital Course: 68 y.o. male with medical history significant of PVD (s/p of right common femoral to TP trunk bypass with vein on 02/25/2022), HTN, HLD,m DM, CAD, dCHF, DVT and A fib on Eliquis , s/p of left BKA, s/p of third finger in left hand, former smoker, s/p of right 5th toe, who presents with fall and dysuria.   Pt states that he was trying to get out of his wheelchair to go into bed, did not have his prosthetic leg on, fell to the ground.  States that next thing he knew it was morning and he was still on the ground. Pt does not remember what happened in between but called his sister because he cannot get off the floor.  He complains of pain in sacral area. He states that in the past 2 days he has dysuria and burning on urination, no hematuria.  No nausea, vomiting, diarrhea or abdominal pain.  No chest pain, cough, SOB.  No fever or chills.  No unilateral numbness or tingling in extremities.  No facial droop or slurred speech.  He took his Eliquis  yesterday.  5/26.  Patient admitted with rhabdomyolysis, fall and urinary infection.  Continue Rocephin  and antibiotics. 5/27.  Patient changed his mind and  wants to go home with home health.  E. coli growing out of urine culture sensitive to Rocephin  and Keflex .  Antibiotics changed to Keflex  upon going home.  Assessment and Plan: * Rhabdomyolysis CK trending better down to 275.  Initial CK 1421 on presentation.   Hold statin.  Stable for discharge home  Acute cystitis with hematuria Patient received Rocephin  here.  Urine culture growing E. coli.  Switch over to Keflex  upon going home.  Chronic diastolic CHF (congestive heart failure) (HCC) No signs of heart failure.  Last EF 50%.  Hyperlipidemia Hold Lipitor  with rhabdomyolysis  Essential hypertension Continue Cozaar   DM (diabetes mellitus), type 2 with peripheral vascular complications (HCC) And also hyperglycemia.  Continue long-acting insulin  up to 45 units.  Continue short acting insulin .  Coronary artery disease Will drop off aspirin  since we do not need 3 blood thinners.  Discontinue Lipitor .  Peripheral vascular disease (HCC) Continue Eliquis .  Continue Plavix .  Atrial fibrillation, chronic (HCC) Continue Eliquis   Overweight (BMI 25.0-29.9) BMI 29.21        Consultants: None Procedures performed: None Disposition: Home health Diet recommendation:  Cardiac and Carb modified diet DISCHARGE MEDICATION: Allergies as of 03/02/2024   No Known Allergies      Medication List     STOP taking these medications    aspirin  EC 81 MG tablet   atorvastatin  80 MG tablet Commonly known as: LIPITOR    oxyCODONE -acetaminophen  5-325 MG tablet Commonly known  as: Percocet       TAKE these medications    acetaminophen  325 MG tablet Commonly known as: TYLENOL  Take 2 tablets (650 mg total) by mouth every 6 (six) hours as needed for mild pain (pain score 1-3) or fever.   ascorbic acid  500 MG tablet Commonly known as: VITAMIN C  Take 1 tablet (500 mg total) by mouth 2 (two) times daily.   cephALEXin  500 MG capsule Commonly known as: KEFLEX  Take 1 capsule (500 mg  total) by mouth every 8 (eight) hours for 3 days.   clopidogrel  75 MG tablet Commonly known as: PLAVIX  Take 1 tablet (75 mg total) by mouth daily at 6 (six) AM.   Eliquis  5 MG Tabs tablet Generic drug: apixaban  Take 5 mg by mouth 2 (two) times daily.   gabapentin  100 MG capsule Commonly known as: NEURONTIN  Take 100 mg by mouth 3 (three) times daily.   HumaLOG  KwikPen 100 UNIT/ML KwikPen Generic drug: insulin  lispro Inject 12 Units into the skin 3 (three) times daily. What changed: how much to take   insulin  glargine 100 UNIT/ML Solostar Pen Commonly known as: LANTUS  Inject 45 Units into the skin at bedtime. What changed:  how much to take when to take this   losartan  25 MG tablet Commonly known as: COZAAR  Take 1 tablet (25 mg total) by mouth daily.   nicotine  21 mg/24hr patch Commonly known as: NICODERM CQ  - dosed in mg/24 hours Place 1 patch (21 mg total) onto the skin daily.   Trulicity 1.5 MG/0.5ML Soaj Generic drug: Dulaglutide Inject 1.5 mg into the skin once a week.   Vitamin D3 125 MCG (5000 UT) Caps Take 1 capsule by mouth daily.   Zinc  Sulfate 220 (50 Zn) MG Tabs Take 1 tablet (220 mg total) by mouth daily after supper.        Follow-up Information     [facility] [place] [employer]. Follow up.   Why: First available appointment. Contact information: [address] [place] [address] [phone]         [facility] Follow up.   Why: 05/26---Per Bartholomew Light, agency will accept for  home health PT/OT/HHA or OTA Contact information: [address] [place] [address] [phone]         [place], [name] T, MD Follow up on 03/18/2024.   Specialty: Family Medicine Why: at 6p Contact information: [address] [place] [address] [phone]                Discharge Exam: Cleavon Curls Weights   02/29/24 1429 03/02/24 0500  Weight: 108.9 kg 109.5 kg   Physical Exam HENT:     Head: Normocephalic.   Eyes:     General: Lids are normal.  Cardiovascular:     Rate and Rhythm: Normal rate and regular rhythm.     Heart sounds: Normal heart sounds, S1 normal and S2 normal.  Pulmonary:     Breath sounds: No decreased breath sounds, wheezing, rhonchi or rales.  Abdominal:     Palpations: Abdomen is soft.     Tenderness: There is no abdominal tenderness.  Musculoskeletal:     Right lower leg: No swelling.     Left Lower Extremity: Left leg is amputated below knee.  Skin:    General: Skin is warm.     Comments: Superficial ulcer more like scarring on left stump.  Neurological:     Mental Status: He is alert and oriented to person, place, and  time.      Condition at discharge: stable  The results of significant diagnostics from this hospitalization (including imaging, microbiology, ancillary and laboratory) are listed below for reference.   Imaging Studies: DG Sacrum/Coccyx Result Date: 02/29/2024 CLINICAL DATA:  Marvell Slider yesterday, sacral pain EXAM: SACRUM AND COCCYX - 2+ VIEW COMPARISON:  01/16/2023 FINDINGS: Pelvic inlet, pelvic outlet, and lateral views of the sacrum are obtained. No acute displaced fracture. Sacroiliac joints are unremarkable. Mild symmetrical bilateral hip osteoarthritis. Numerous vascular stents are seen within the external iliac and femoral arterial distributions. IMPRESSION: 1. No acute displaced fracture. Electronically Signed   By: Bobbye Burrow M.D.   On: 02/29/2024 16:17   DG Chest 1 View Result Date: 02/29/2024 CLINICAL DATA:  Unwitnessed fall from wheelchair. EXAM: CHEST  1 VIEW COMPARISON:  02/15/2022. FINDINGS: The heart size and mediastinal contours are within normal limits. No consolidation, effusion, or pneumothorax is seen. No acute osseous abnormality. IMPRESSION: No active disease. Electronically Signed   By: Wyvonnia Heimlich M.D.   On: 02/29/2024 16:15   CT Cervical Spine Wo Contrast Result Date: 02/29/2024 CLINICAL DATA:  Neck trauma (Age >= 65y)  Unwitnessed fall last night. EXAM: CT CERVICAL SPINE WITHOUT CONTRAST TECHNIQUE: Multidetector CT imaging of the cervical spine was performed without intravenous contrast. Multiplanar CT image reconstructions were also generated. RADIATION DOSE REDUCTION: This exam was performed according to the departmental dose-optimization program which includes automated exposure control, adjustment of the mA and/or kV according to patient size and/or use of iterative reconstruction technique. COMPARISON:  None Available. FINDINGS: Alignment: Straightening of normal lordosis. Broad-based dextroscoliotic curvature. No traumatic subluxation. Skull base and vertebrae: No acute fracture. The dens and skull base are intact. Posterior decompression from C2 through C7. Soft tissues and spinal canal: No prevertebral fluid or swelling. No visible canal hematoma. Disc levels: Diffuse disc space narrowing with anterior spurring C3-C4 through C6-C7. There is mild multilevel facet hypertrophy. Upper chest: Emphysema.  No acute findings. Other: None. IMPRESSION: 1. No acute fracture or subluxation of the cervical spine. 2. Posterior decompression from C2 through C7. 3. Multilevel degenerative disc disease and facet hypertrophy. Electronically Signed   By: Chadwick Colonel M.D.   On: 02/29/2024 16:11   CT HEAD WO CONTRAST Result Date: 02/29/2024 CLINICAL DATA:  Provided history: Head trauma, GCS=15, loss of consciousness (LOC) (Ped 0-17y) Unwitnessed fall last night. EXAM: CT HEAD WITHOUT CONTRAST TECHNIQUE: Contiguous axial images were obtained from the base of the skull through the vertex without intravenous contrast. RADIATION DOSE REDUCTION: This exam was performed according to the departmental dose-optimization program which includes automated exposure control, adjustment of the mA and/or kV according to patient size and/or use of iterative reconstruction technique. COMPARISON:  02/15/2022 FINDINGS: Brain: No intracranial hemorrhage,  mass effect, or midline shift. Brain volume is normal for age. No hydrocephalus. The basilar cisterns are patent. Mild periventricular and deep white matter hypodensity typical of chronic small vessel ischemia. No evidence of territorial infarct or acute ischemia. No extra-axial or intracranial fluid collection. Vascular: Atherosclerosis of skullbase vasculature without hyperdense vessel or abnormal calcification. Skull: No fracture or focal lesion. Sinuses/Orbits: Scattered mucosal thickening of paranasal sinuses with small mucous retention cyst in the right maxillary sinus. No evidence of acute fracture. Bilateral cataract resection. Other: No confluent scalp hematoma. IMPRESSION: 1. No acute intracranial abnormality. No skull fracture. 2. Mild chronic small vessel ischemia. Electronically Signed   By: Chadwick Colonel M.D.   On: 02/29/2024 16:07    Microbiology: Results for orders  placed or performed during the hospital encounter of 02/29/24  Urine Culture (for pregnant, neutropenic or urologic patients or patients with an indwelling urinary catheter)     Status: Abnormal   Collection Time: 02/29/24  5:02 PM   Specimen: Urine, Clean Catch  Result Value Ref Range Status   Specimen Description   Final    URINE, CLEAN CATCH Performed at [facility], [address] [place], [place] [address]    Special Requests   Final    NONE Performed at [facility], [address] [place], [place] [address]    Culture >=100,000 COLONIES/mL ESCHERICHIA COLI (A)  Final   Report Status 03/02/2024 FINAL  Final   Organism ID, Bacteria ESCHERICHIA COLI (A)  Final      Susceptibility   Escherichia coli - MIC*    AMPICILLIN  <=2 SENSITIVE Sensitive     CEFAZOLIN  <=4 SENSITIVE Sensitive     CEFEPIME  <=0.12 SENSITIVE Sensitive     CEFTRIAXONE  <=0.25 SENSITIVE Sensitive     CIPROFLOXACIN <=0.25 SENSITIVE Sensitive     GENTAMICIN  <=1 SENSITIVE Sensitive     IMIPENEM <=0.25 SENSITIVE Sensitive      NITROFURANTOIN <=16 SENSITIVE Sensitive     TRIMETH/SULFA <=20 SENSITIVE Sensitive     AMPICILLIN /SULBACTAM <=2 SENSITIVE Sensitive     PIP/TAZO <=4 SENSITIVE Sensitive ug/mL    * >=100,000 COLONIES/mL ESCHERICHIA COLI    Labs: CBC: Recent Labs  Lab 02/29/24 1501 03/01/24 0406  WBC 23.4* 15.9*  HGB 14.6 12.8*  HCT 45.0 38.4*  MCV 83.0 82.4  PLT 182 167   Basic Metabolic Panel: Recent Labs  Lab 02/29/24 1501 03/01/24 0406 03/02/24 0408  NA 135 136 140  K 3.8 3.7 3.7  CL 101 102 105  CO2 20* 25 26  GLUCOSE 227* 185* 170*  BUN 18 16 13   CREATININE 1.00 0.87 0.78  CALCIUM  9.0 8.3* 8.3*   Liver Function Tests: Recent Labs  Lab 02/29/24 1501  AST 30  ALT 21  ALKPHOS 76  BILITOT 1.6*  PROT 8.1  ALBUMIN 3.6   CBG: Recent Labs  Lab 03/01/24 1520 03/01/24 2048 03/01/24 2115 03/02/24 0726 03/02/24 1138  GLUCAP 336* 346* 283* 164* 217*    Discharge time spent: greater than 30 minutes.  Signed: Verla Glaze, MD [facility] 03/02/2024

## 2024-03-02 NOTE — Progress Notes (Addendum)
 Discharge criteria met

## 2024-03-02 NOTE — TOC Transition Note (Signed)
 Transition of Care [facility]) - Discharge Note   Patient Details  Name: Austin [name] MRN: [id number] Date of Birth: 1956/05/14  [facility] CM/SW Contact:  Alexandra Ice, RN Phone Number: 03/02/2024, 10:21 AM   Clinical Narrative:     Patient to discharge today, home with home health services. [facility] accepted. Notified Bartholomew Light patient to discharge today.   Final next level of care: Home w Home Health Services Barriers to Discharge: Barriers Resolved   Patient Goals and CMS Choice Patient states their goals for this hospitalization and ongoing recovery are:: get better          Discharge Placement                  Name of family member notified: Elihu Grumet.  Son Patient and family notified of of transfer: 03/02/24  Discharge Plan and Services Additional resources added to the After Visit Summary for                    DME Agency: NA       HH Arranged: PT, OT HH Agency: [facility] Date [facility] Agency Contacted: 03/02/24 Time HH Agency Contacted: 1021 Representative spoke with at [facility] Agency: Bartholomew Light  Social Drivers of Health (SDOH) Interventions SDOH Screenings   Food Insecurity: No Food Insecurity (02/29/2024)  Housing: Low Risk  (02/29/2024)  Transportation Needs: No Transportation Needs (02/29/2024)  Utilities: Not At Risk (02/29/2024)  Depression (PHQ2-9): Low Risk  (06/25/2022)  Social Connections: Moderately Isolated (02/29/2024)  Tobacco Use: Medium Risk (02/29/2024)     Readmission Risk Interventions    11/08/2023    3:14 PM  Readmission Risk Prevention Plan  Transportation Screening Complete  PCP or Specialist Appt within 5-7 Days Complete  Home Care Screening Complete  Medication Review (RN CM) Complete

## 2024-03-02 NOTE — Discharge Instructions (Signed)
 [facility]  They will call you to schedule when they will come out to see you for nursing and Physical Therapy Home health care service [address]  [phone] Open ? Closes 5?PM

## 2024-03-02 NOTE — Progress Notes (Signed)
 Occupational Therapy Treatment Patient Details Name: Austin [name] MRN: [id number] DOB: [birth year] Today's Date: 03/02/2024   History of present illness Pt is a 68 y/o M admitted on 02/29/24 after presenting with c/o a fall. Pt is being treated for rhabdomyolysis, UTI. PMH: a-fib on Eliquis , DVT, CHF, HTN, HLD, DM, L BKA, CAD, PVD   OT comments  Chart reviewed to date, pt greeted in bed, agreeable to OT tx session targeting improving functional activity tolerance in prep for ADL tasks. Pt requested to perform squat pivot to bsc on this date due to poorly fitting prosthetic with pt performing with CGA-MIN A. UB dressing completed with SET UP. Educated pt re: role of OT, safe ADL completion with DME at home. Pt reports he wants to return home.  Pt is making progress towards goals, OT will continue to follow.       If plan is discharge home, recommend the following:  A little help with walking and/or transfers;A little help with bathing/dressing/bathroom;Assistance with cooking/housework   Equipment Recommendations  Other (comment) (follow up LLE prosthetic, follow up appt with [facility] for approrpiate wheelchair)    Recommendations for Other Services      Precautions / Restrictions Precautions Precautions: Fall Recall of Precautions/Restrictions: Impaired Precaution/Restrictions Comments: LLE prosthesis Restrictions Weight Bearing Restrictions Per Provider Order: No       Mobility Bed Mobility Overal bed mobility: Modified Independent                  Transfers Overall transfer level: Needs assistance   Transfers: Bed to chair/wheelchair/BSC     Squat pivot transfers: Contact guard assist, Min assist       General transfer comment: squat pivot performed due to noted poorly fitting prosthesis     Balance Overall balance assessment: Needs assistance Sitting-balance support: Single extremity supported Sitting balance-Leahy Scale: Good                                      ADL either performed or assessed with clinical judgement   ADL Overall ADL's : Needs assistance/impaired Eating/Feeding: Set up;Sitting               Upper Body Dressing : Set up;Sitting Upper Body Dressing Details (indicate cue type and reason): doff/donn gown     Toilet Transfer: Contact guard assist;Minimal assistance Toilet Transfer Details (indicate cue type and reason): squat pivot to bedside chair                Extremity/Trunk Assessment              Vision       Perception     Praxis     Communication Communication Communication: No apparent difficulties   Cognition Arousal: Alert Behavior During Therapy: WFL for tasks assessed/performed Cognition: No family/caregiver present to determine baseline, Cognition impaired           Executive functioning impairment (select all impairments): Problem solving                   Following commands: Intact        Cueing   Cueing Techniques: Verbal cues  Exercises Other Exercises Other Exercises: edu re: role of OT, role of rehab, discharge recommendations, home safety, DME use, appt with [facility] for follow up on wheelchair    Shoulder Instructions       General Comments  Pertinent Vitals/ Pain       Pain Assessment Pain Assessment: No/denies pain  Home Living                                          Prior Functioning/Environment              Frequency  Min 2X/week        Progress Toward Goals  OT Goals(current goals can now be found in the care plan section)  Progress towards OT goals: Progressing toward goals  Acute Rehab OT Goals Time For Goal Achievement: 03/15/24  Plan      Co-evaluation                 AM-PAC OT "6 Clicks" Daily Activity     Outcome Measure   Help from another person eating meals?: None Help from another person taking care of personal grooming?: A Little Help from another person toileting,  which includes using toliet, bedpan, or urinal?: A Little Help from another person bathing (including washing, rinsing, drying)?: A Little Help from another person to put on and taking off regular upper body clothing?: A Little Help from another person to put on and taking off regular lower body clothing?: A Little 6 Click Score: 19    End of Session    OT Visit Diagnosis: Unsteadiness on feet (R26.81);Muscle weakness (generalized) (M62.81)   Activity Tolerance Patient tolerated treatment well   Patient Left in chair;with chair alarm set;with call bell/phone within reach   Nurse Communication          Time: 1194-1740 OT Time Calculation (min): 16 min  Charges: OT General Charges $OT Visit: 1 Visit OT Treatments $Therapeutic Activity: 8-22 mins Gerre Kraft, OTD OTR/L  03/02/24, 11:13 AM

## 2024-03-02 NOTE — Plan of Care (Signed)
  Problem: Clinical Measurements: Goal: Ability to maintain clinical measurements within normal limits will improve Outcome: Completed/Met   Problem: Nutrition: Goal: Adequate nutrition will be maintained Outcome: Completed/Met   Problem: Elimination: Goal: Will not experience complications related to urinary retention Outcome: Completed/Met   Problem: Pain Managment: Goal: General experience of comfort will improve and/or be controlled Outcome: Completed/Met   Problem: Safety: Goal: Ability to remain free from injury will improve Outcome: Completed/Met

## 2024-05-03 ENCOUNTER — Encounter: Payer: Self-pay | Admitting: Emergency Medicine

## 2024-05-03 ENCOUNTER — Emergency Department
Admission: EM | Admit: 2024-05-03 | Discharge: 2024-05-03 | Disposition: A | Discharge status: Home / Self Care | Attending: Emergency Medicine | Admitting: Emergency Medicine

## 2024-05-03 ENCOUNTER — Emergency Department

## 2024-05-03 ENCOUNTER — Other Ambulatory Visit: Payer: Self-pay

## 2024-05-03 DIAGNOSIS — E11621 Type 2 diabetes mellitus with foot ulcer: Secondary | ICD-10-CM | POA: Insufficient documentation

## 2024-05-03 DIAGNOSIS — I2510 Atherosclerotic heart disease of native coronary artery without angina pectoris: Secondary | ICD-10-CM | POA: Diagnosis not present

## 2024-05-03 DIAGNOSIS — L97519 Non-pressure chronic ulcer of other part of right foot with unspecified severity: Secondary | ICD-10-CM | POA: Diagnosis not present

## 2024-05-03 DIAGNOSIS — E13621 Other specified diabetes mellitus with foot ulcer: Secondary | ICD-10-CM

## 2024-05-03 LAB — CBC WITH DIFFERENTIAL/PLATELET
Abs Immature Granulocytes: 0.05 K/uL (ref 0.00–0.07)
Basophils Absolute: 0 K/uL (ref 0.0–0.1)
Basophils Relative: 0 %
Eosinophils Absolute: 0.1 K/uL (ref 0.0–0.5)
Eosinophils Relative: 1 %
HCT: 41.5 % (ref 39.0–52.0)
Hemoglobin: 13.5 g/dL (ref 13.0–17.0)
Immature Granulocytes: 1 %
Lymphocytes Relative: 32 %
Lymphs Abs: 2.7 K/uL (ref 0.7–4.0)
MCH: 26.8 pg (ref 26.0–34.0)
MCHC: 32.5 g/dL (ref 30.0–36.0)
MCV: 82.3 fL (ref 80.0–100.0)
Monocytes Absolute: 0.6 K/uL (ref 0.1–1.0)
Monocytes Relative: 7 %
Neutro Abs: 5 K/uL (ref 1.7–7.7)
Neutrophils Relative %: 59 %
Platelets: 212 K/uL (ref 150–400)
RBC: 5.04 MIL/uL (ref 4.22–5.81)
RDW: 16.2 % — ABNORMAL HIGH (ref 11.5–15.5)
WBC: 8.5 K/uL (ref 4.0–10.5)
nRBC: 0 % (ref 0.0–0.2)

## 2024-05-03 LAB — COMPREHENSIVE METABOLIC PANEL WITH GFR
ALT: 14 U/L (ref 0–44)
AST: 16 U/L (ref 15–41)
Albumin: 3.2 g/dL — ABNORMAL LOW (ref 3.5–5.0)
Alkaline Phosphatase: 74 U/L (ref 38–126)
Anion gap: 12 (ref 5–15)
BUN: 12 mg/dL (ref 8–23)
CO2: 25 mmol/L (ref 22–32)
Calcium: 9.6 mg/dL (ref 8.9–10.3)
Chloride: 108 mmol/L (ref 98–111)
Creatinine, Ser: 0.83 mg/dL (ref 0.61–1.24)
GFR, Estimated: >60 mL/min (ref >60)
Glucose, Bld: 121 mg/dL — ABNORMAL HIGH (ref 70–99)
Potassium: 4.1 mmol/L (ref 3.5–5.1)
Sodium: 145 mmol/L (ref 135–145)
Total Bilirubin: 0.4 mg/dL (ref 0.0–1.2)
Total Protein: 7.3 g/dL (ref 6.5–8.1)

## 2024-05-03 LAB — LACTIC ACID, PLASMA: Lactic Acid, Venous: 1.3 mmol/L (ref 0.5–1.9)

## 2024-05-03 MED ORDER — DOXYCYCLINE HYCLATE 100 MG PO TABS: 100.0000 mg | ORAL_TABLET | Freq: Two times a day (BID) | ORAL | 0 refills | Status: DC

## 2024-05-03 NOTE — ED Triage Notes (Signed)
 First nurse note: Pt here via [facility] from [facility] for, pt here for ulcer on R foot, L BKA, pt is diabetic.    147/87 HR: 88 98% RA

## 2024-05-03 NOTE — ED Provider Triage Note (Signed)
 Emergency Medicine Provider Triage Evaluation Note  KEANEN DOHSE , a 68 y.o. male  was evaluated in triage.  Pt complains of wound in the right foot for about a month.  States having history of diabetes, hypertension.  Patient was seen today in [facility], will refer to ED.  Denies fever.  Patient is using insulin   Review of Systems  Positive:  Negative:   Physical Exam  BP (!) 150/89   Pulse (!) 55   Temp 97.7 F (36.5 C) (Oral)   Resp 17   Ht 6' (1.829 m)   Wt 108.9 kg   SpO2 100%   BMI 32.55 kg/m  Gen:   Awake, no distress   Resp:  Normal effort  MSK:   Moves extremities without difficulty  Other:  Right foot; presence of 2.2 out lesions 1 in the sole at the base of the first metatarsal, and fifth metatarsal.  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  There is no erythema, warmness, tenderness.  Her edema at the level of the ankle.  Pulses positive  Medical Decision Making  Medically screening exam initiated at 4:45 PM.  Appropriate orders placed.  Elpidio D Alabi was informed that the remainder of the evaluation will be completed by another provider, this initial triage assessment does not replace that evaluation, and the importance of remaining in the ED until their evaluation is complete.  Patient who presents today with history of 1 month of wound in his right foot.  Patient is diabetic, his insulin  was seen today and  community center was referred to ED.  Denies fever. Ordered CBC, CMP lactic acid   Janit Kast, PA-C 05/03/24 1647

## 2024-05-03 NOTE — ED Triage Notes (Signed)
 Patient to ED via [facility] from [facility] for wounds on right foot and leg. Ongoing since Wednesday. Hx of diabetes, left BKA

## 2024-05-03 NOTE — ED Provider Notes (Signed)
 [facility] Provider Note    Event Date/Time   First MD Initiated Contact with Patient 05/03/24 1701     (approximate)   History   Wound Check   HPI  Austin [name] is a 68 y.o. male with a history of atrial fibrillation CAD, PAD, diabetes, status post left-sided below the knee amputation who presents for wound check.  Patient sent for evaluation of ulcer to the bottom of his right foot which was discovered today.  He is afebrile and overall feels well     Physical Exam   Triage Vital Signs: ED Triage Vitals [05/03/24 1644]  Encounter Vitals Group     BP (!) 150/89     Girls Systolic BP Percentile      Girls Diastolic BP Percentile      Boys Systolic BP Percentile      Boys Diastolic BP Percentile      Pulse Rate (!) 55     Resp 17     Temp 97.7 F (36.5 C)     Temp Source Oral     SpO2 100 %     Weight 108.9 kg (240 lb)     Height 1.829 m (6')     Head Circumference      Peak Flow      Pain Score 0     Pain Loc      Pain Education      Exclude from Growth Chart     Most recent vital signs: Vitals:   05/03/24 1644  BP: (!) 150/89  Pulse: (!) 55  Resp: 17  Temp: 97.7 F (36.5 C)  SpO2: 100%     General: Awake, no distress.  CV:  Good peripheral perfusion.  Resp:  Normal effort.  Abd:  No distention.  Other:     ED Results / Procedures / Treatments   Labs (all labs ordered are listed, but only abnormal results are displayed) Labs Reviewed  COMPREHENSIVE METABOLIC PANEL WITH GFR - Abnormal; Notable for the following components:      Result Value   Glucose, Bld 121 (*)    Albumin 3.2 (*)    All other components within normal limits  CBC WITH DIFFERENTIAL/PLATELET - Abnormal; Notable for the following components:   RDW 16.2 (*)    All other components within normal limits  LACTIC ACID, PLASMA     EKG     RADIOLOGY Foot x-ray without acute abnormality    PROCEDURES:  Critical Care performed:    Procedures   MEDICATIONS ORDERED IN ED: Medications - No data to display   IMPRESSION / MDM / ASSESSMENT AND PLAN / ED COURSE  I reviewed the triage vital signs and the nursing notes. Patient's presentation is most consistent with severe exacerbation of chronic illness.  Patient presents with likely diabetic foot ulcer to the bottom of his right foot, he has a left BKA and known peripheral vascular disease  The right extremity is warm and decently perfused, no surrounding erythema to the ulcer or discharge  Lab work, x-rays are reassuring  Will start the patient on antibiotics, have him follow-up closely with podiatry and vascular surgery, no indication for admission at this time.        FINAL CLINICAL IMPRESSION(S) / ED DIAGNOSES   Final diagnoses:  Diabetic ulcer of right midfoot associated with diabetes mellitus of other type, unspecified ulcer stage (HCC)     Rx / DC Orders   ED Discharge Orders  Ordered    doxycycline  (VIBRA -TABS) 100 MG tablet  2 times daily        05/03/24 1826             Note:  This document was prepared using Dragon voice recognition software and may include unintentional dictation errors.   Arlander [place], MD 05/03/24 [phone]

## 2024-05-03 NOTE — Discharge Instructions (Signed)
 Please follow-up with vascular surgery and podiatry as we discussed, take the antibiotics as prescribed

## 2024-05-13 ENCOUNTER — Ambulatory Visit (INDEPENDENT_AMBULATORY_CARE_PROVIDER_SITE_OTHER): Admitting: Podiatry

## 2024-05-13 DIAGNOSIS — L97512 Non-pressure chronic ulcer of other part of right foot with fat layer exposed: Secondary | ICD-10-CM | POA: Diagnosis not present

## 2024-05-13 DIAGNOSIS — E119 Type 2 diabetes mellitus without complications: Secondary | ICD-10-CM

## 2024-05-13 NOTE — Progress Notes (Signed)
 Subjective:  Patient ID: Austin [name], male    DOB: Jan 15, 1956,  MRN: [id number]  Chief Complaint  Patient presents with   Wound Check    68 y.o. male presents for wound care.  Patient presents with complaint of right submetatarsal 4 ulceration.  Patient states is painful to touch is progressive gotten worse.  He noticed recently.  Has been trying to take care of himself he is a diabetic with last A1c of 8.3%.  Wanted get it evaluated has not seen anyone as prior to seeing me for this.  Has a history of toe amputations   Review of Systems: Negative except as noted in the HPI. Denies N/V/F/Ch.  Past Medical History:  Diagnosis Date   Atrial fibrillation (HCC)    Coronary artery disease    Diabetes (HCC)    DVT (deep venous thrombosis) (HCC)    Gangrene of left foot (HCC) 02/09/2018   GERD (gastroesophageal reflux disease)    Hyperlipidemia    Hypertension    Peripheral vascular disease (HCC)     Current Outpatient Medications:    acetaminophen  (TYLENOL ) 325 MG tablet, Take 2 tablets (650 mg total) by mouth every 6 (six) hours as needed for mild pain (pain score 1-3) or fever., Disp: , Rfl:    ascorbic acid  (VITAMIN C ) 500 MG tablet, Take 1 tablet (500 mg total) by mouth 2 (two) times daily., Disp: 60 tablet, Rfl: 0   Cholecalciferol  (VITAMIN D3) 125 MCG (5000 UT) CAPS, Take 1 capsule by mouth daily., Disp: , Rfl:    clopidogrel  (PLAVIX ) 75 MG tablet, Take 1 tablet (75 mg total) by mouth daily at 6 (six) AM., Disp: 60 tablet, Rfl: 6   doxycycline  (VIBRA -TABS) 100 MG tablet, Take 1 tablet (100 mg total) by mouth 2 (two) times daily., Disp: 14 tablet, Rfl: 0   ELIQUIS  5 MG TABS tablet, Take 5 mg by mouth 2 (two) times daily., Disp: , Rfl:    gabapentin  (NEURONTIN ) 100 MG capsule, Take 100 mg by mouth 3 (three) times daily., Disp: , Rfl:    HUMALOG  KWIKPEN 100 UNIT/ML KwikPen, Inject 12 Units into the skin 3 (three) times daily., Disp: , Rfl:    insulin  glargine (LANTUS ) 100  UNIT/ML Solostar Pen, Inject 45 Units into the skin at bedtime., Disp: , Rfl:    losartan  (COZAAR ) 25 MG tablet, Take 1 tablet (25 mg total) by mouth daily., Disp: 30 tablet, Rfl: 0   nicotine  (NICODERM CQ  - DOSED IN MG/24 HOURS) 21 mg/24hr patch, Place 1 patch (21 mg total) onto the skin daily., Disp: 30 patch, Rfl: 0   TRULICITY 1.5 MG/0.5ML SOAJ, Inject 1.5 mg into the skin once a week., Disp: , Rfl:    Zinc  Sulfate 220 (50 Zn) MG TABS, Take 1 tablet (220 mg total) by mouth daily after supper., Disp: 30 tablet, Rfl: 0  Social History   Tobacco Use  Smoking Status Former   Current packs/day: 0.00   Average packs/day: 1 pack/day for 47.0 years (47.0 ttl pk-yrs)   Types: Cigarettes   Start date: 01/1974   Quit date: 01/2021   Years since quitting: 3.3  Smokeless Tobacco Never    No Known Allergies Objective:  There were no vitals filed for this visit. There is no height or weight on file to calculate BMI. Constitutional Well developed. Well nourished.  Vascular Dorsalis pedis pulses palpable bilaterally. Posterior tibial pulses palpable bilaterally. Capillary refill normal to all digits.  No cyanosis or clubbing noted. Pedal hair  growth normal.  Neurologic Normal speech. Oriented to person, place, and time. Protective sensation absent  Dermatologic Wound Location: Right submetatarsal 4 ulcer fat layer exposed does not probe down to deep tissue macerated skin noted.  No malodor present. Wound Base: Mixed Granular/Fibrotic Peri-wound: Calloused Exudate: Scant/small amount Serosanguinous exudate Wound Measurements: - See below  Orthopedic: No pain to palpation either foot.   Radiographs: None Assessment:   1. Right foot ulcer, with fat layer exposed (HCC)   2. Type 2 diabetes mellitus without complication, without long-term current use of insulin  (HCC)    Plan:  Patient was evaluated and treated and all questions answered.  Ulcer right submet 4 ulcer fat layer  exposed -Debridement as below. -Dressed with Betadine  wet-to-dry, DSD. -Continue off-loading with surgical shoe.  Procedure: Excisional Debridement of Wound Tool: Sharp chisel blade/tissue nipper Rationale: Removal of non-viable soft tissue from the wound to promote healing.  Anesthesia: none Pre-Debridement Wound Measurements: 1 cm x 0.5 cm x 0.3 cm  Post-Debridement Wound Measurements: 1.1 cm x 0.7 cm x 0.3 cm  Type of Debridement: Sharp Excisional Tissue Removed: Non-viable soft tissue Blood loss: Minimal (<50cc) Depth of Debridement: subcutaneous tissue. Technique: Sharp excisional debridement to bleeding, viable wound base.  Wound Progress: This my initial evaluation will continue monitor the progression of the wound Site healing conversation 7 Dressing: Dry, sterile, compression dressing. Disposition: Patient tolerated procedure well. Patient to return in 1 week for follow-up.  No follow-ups on file.

## 2024-05-26 ENCOUNTER — Other Ambulatory Visit: Payer: Self-pay

## 2024-05-26 DIAGNOSIS — E872 Acidosis, unspecified: Secondary | ICD-10-CM | POA: Diagnosis not present

## 2024-05-26 DIAGNOSIS — T82856A Stenosis of peripheral vascular stent, initial encounter: Secondary | ICD-10-CM | POA: Diagnosis present

## 2024-05-26 DIAGNOSIS — T82858A Stenosis of vascular prosthetic devices, implants and grafts, initial encounter: Principal | ICD-10-CM | POA: Diagnosis present

## 2024-05-26 DIAGNOSIS — I480 Paroxysmal atrial fibrillation: Secondary | ICD-10-CM | POA: Diagnosis present

## 2024-05-26 DIAGNOSIS — Z806 Family history of leukemia: Secondary | ICD-10-CM

## 2024-05-26 DIAGNOSIS — Z833 Family history of diabetes mellitus: Secondary | ICD-10-CM

## 2024-05-26 DIAGNOSIS — Z79899 Other long term (current) drug therapy: Secondary | ICD-10-CM

## 2024-05-26 DIAGNOSIS — L97519 Non-pressure chronic ulcer of other part of right foot with unspecified severity: Secondary | ICD-10-CM | POA: Diagnosis present

## 2024-05-26 DIAGNOSIS — M869 Osteomyelitis, unspecified: Secondary | ICD-10-CM | POA: Diagnosis not present

## 2024-05-26 DIAGNOSIS — Z86718 Personal history of other venous thrombosis and embolism: Secondary | ICD-10-CM

## 2024-05-26 DIAGNOSIS — Z7985 Long-term (current) use of injectable non-insulin antidiabetic drugs: Secondary | ICD-10-CM

## 2024-05-26 DIAGNOSIS — K5900 Constipation, unspecified: Secondary | ICD-10-CM | POA: Diagnosis not present

## 2024-05-26 DIAGNOSIS — I5032 Chronic diastolic (congestive) heart failure: Secondary | ICD-10-CM | POA: Diagnosis present

## 2024-05-26 DIAGNOSIS — E785 Hyperlipidemia, unspecified: Secondary | ICD-10-CM | POA: Diagnosis present

## 2024-05-26 DIAGNOSIS — Z794 Long term (current) use of insulin: Secondary | ICD-10-CM

## 2024-05-26 DIAGNOSIS — Z8249 Family history of ischemic heart disease and other diseases of the circulatory system: Secondary | ICD-10-CM

## 2024-05-26 DIAGNOSIS — E1151 Type 2 diabetes mellitus with diabetic peripheral angiopathy without gangrene: Secondary | ICD-10-CM | POA: Diagnosis present

## 2024-05-26 DIAGNOSIS — Z7902 Long term (current) use of antithrombotics/antiplatelets: Secondary | ICD-10-CM

## 2024-05-26 DIAGNOSIS — Y832 Surgical operation with anastomosis, bypass or graft as the cause of abnormal reaction of the patient, or of later complication, without mention of misadventure at the time of the procedure: Secondary | ICD-10-CM | POA: Diagnosis present

## 2024-05-26 DIAGNOSIS — Z89022 Acquired absence of left finger(s): Secondary | ICD-10-CM

## 2024-05-26 DIAGNOSIS — Z89512 Acquired absence of left leg below knee: Secondary | ICD-10-CM

## 2024-05-26 DIAGNOSIS — E11621 Type 2 diabetes mellitus with foot ulcer: Secondary | ICD-10-CM | POA: Diagnosis present

## 2024-05-26 DIAGNOSIS — I998 Other disorder of circulatory system: Secondary | ICD-10-CM | POA: Diagnosis present

## 2024-05-26 DIAGNOSIS — K219 Gastro-esophageal reflux disease without esophagitis: Secondary | ICD-10-CM | POA: Diagnosis present

## 2024-05-26 DIAGNOSIS — Z87891 Personal history of nicotine dependence: Secondary | ICD-10-CM

## 2024-05-26 DIAGNOSIS — L97419 Non-pressure chronic ulcer of right heel and midfoot with unspecified severity: Secondary | ICD-10-CM | POA: Diagnosis present

## 2024-05-26 DIAGNOSIS — Z89421 Acquired absence of other right toe(s): Secondary | ICD-10-CM

## 2024-05-26 DIAGNOSIS — I2510 Atherosclerotic heart disease of native coronary artery without angina pectoris: Secondary | ICD-10-CM | POA: Diagnosis present

## 2024-05-26 DIAGNOSIS — E1169 Type 2 diabetes mellitus with other specified complication: Secondary | ICD-10-CM | POA: Diagnosis present

## 2024-05-26 DIAGNOSIS — I110 Hypertensive heart disease with heart failure: Secondary | ICD-10-CM | POA: Diagnosis present

## 2024-05-26 DIAGNOSIS — E1140 Type 2 diabetes mellitus with diabetic neuropathy, unspecified: Secondary | ICD-10-CM | POA: Diagnosis present

## 2024-05-26 DIAGNOSIS — Y712 Prosthetic and other implants, materials and accessory cardiovascular devices associated with adverse incidents: Secondary | ICD-10-CM | POA: Diagnosis present

## 2024-05-26 DIAGNOSIS — Z7901 Long term (current) use of anticoagulants: Secondary | ICD-10-CM

## 2024-05-26 DIAGNOSIS — M86171 Other acute osteomyelitis, right ankle and foot: Secondary | ICD-10-CM | POA: Diagnosis present

## 2024-05-26 LAB — CBC WITH DIFFERENTIAL/PLATELET
Abs Immature Granulocytes: 0.05 K/uL (ref 0.00–0.07)
Basophils Absolute: 0 K/uL (ref 0.0–0.1)
Basophils Relative: 0 %
Eosinophils Absolute: 0.1 K/uL (ref 0.0–0.5)
Eosinophils Relative: 1 %
HCT: 42.9 % (ref 39.0–52.0)
Hemoglobin: 14 g/dL (ref 13.0–17.0)
Immature Granulocytes: 1 %
Lymphocytes Relative: 28 %
Lymphs Abs: 2.7 K/uL (ref 0.7–4.0)
MCH: 27.4 pg (ref 26.0–34.0)
MCHC: 32.6 g/dL (ref 30.0–36.0)
MCV: 84 fL (ref 80.0–100.0)
Monocytes Absolute: 0.7 K/uL (ref 0.1–1.0)
Monocytes Relative: 8 %
Neutro Abs: 6 K/uL (ref 1.7–7.7)
Neutrophils Relative %: 62 %
Platelets: 222 K/uL (ref 150–400)
RBC: 5.11 MIL/uL (ref 4.22–5.81)
RDW: 16.1 % — ABNORMAL HIGH (ref 11.5–15.5)
WBC: 9.6 K/uL (ref 4.0–10.5)
nRBC: 0 % (ref 0.0–0.2)

## 2024-05-26 LAB — COMPREHENSIVE METABOLIC PANEL WITH GFR
ALT: 15 U/L (ref 0–44)
AST: 18 U/L (ref 15–41)
Albumin: 3.4 g/dL — ABNORMAL LOW (ref 3.5–5.0)
Alkaline Phosphatase: 72 U/L (ref 38–126)
Anion gap: 9 (ref 5–15)
BUN: 10 mg/dL (ref 8–23)
CO2: 26 mmol/L (ref 22–32)
Calcium: 9.3 mg/dL (ref 8.9–10.3)
Chloride: 106 mmol/L (ref 98–111)
Creatinine, Ser: 0.85 mg/dL (ref 0.61–1.24)
GFR, Estimated: >60 mL/min (ref >60)
Glucose, Bld: 164 mg/dL — ABNORMAL HIGH (ref 70–99)
Potassium: 4.1 mmol/L (ref 3.5–5.1)
Sodium: 141 mmol/L (ref 135–145)
Total Bilirubin: <0.2 mg/dL (ref 0.0–1.2)
Total Protein: 7.2 g/dL (ref 6.5–8.1)

## 2024-05-26 NOTE — ED Triage Notes (Signed)
 Pt BIB EMS with c/o right leg pain that radiates down into his foot. Pt also reports wound on his right foot. Pt has been seen for the wound before. Pt has L BKA and hx of diabetes. Per pt, wound care nurses have been coming to his house and changing dressing and performing wound care. Pt denies fevers. Pt reports worsening pain when trying to bear weight on right leg.

## 2024-05-27 ENCOUNTER — Inpatient Hospital Stay

## 2024-05-27 ENCOUNTER — Inpatient Hospital Stay
Admission: EM | Admit: 2024-05-27 | Discharge: 2024-06-03 | DRG: 240 | Disposition: A | Discharge status: Skilled Nursing Facility | Attending: Obstetrics and Gynecology | Admitting: Obstetrics and Gynecology

## 2024-05-27 ENCOUNTER — Emergency Department

## 2024-05-27 DIAGNOSIS — E785 Hyperlipidemia, unspecified: Secondary | ICD-10-CM | POA: Diagnosis present

## 2024-05-27 DIAGNOSIS — L97519 Non-pressure chronic ulcer of other part of right foot with unspecified severity: Secondary | ICD-10-CM | POA: Diagnosis present

## 2024-05-27 DIAGNOSIS — L97419 Non-pressure chronic ulcer of right heel and midfoot with unspecified severity: Secondary | ICD-10-CM | POA: Diagnosis present

## 2024-05-27 DIAGNOSIS — I110 Hypertensive heart disease with heart failure: Secondary | ICD-10-CM | POA: Diagnosis present

## 2024-05-27 DIAGNOSIS — Y712 Prosthetic and other implants, materials and accessory cardiovascular devices associated with adverse incidents: Secondary | ICD-10-CM | POA: Diagnosis present

## 2024-05-27 DIAGNOSIS — Z7901 Long term (current) use of anticoagulants: Secondary | ICD-10-CM | POA: Diagnosis not present

## 2024-05-27 DIAGNOSIS — Z89421 Acquired absence of other right toe(s): Secondary | ICD-10-CM | POA: Diagnosis not present

## 2024-05-27 DIAGNOSIS — Z7985 Long-term (current) use of injectable non-insulin antidiabetic drugs: Secondary | ICD-10-CM | POA: Diagnosis not present

## 2024-05-27 DIAGNOSIS — Z7902 Long term (current) use of antithrombotics/antiplatelets: Secondary | ICD-10-CM | POA: Diagnosis not present

## 2024-05-27 DIAGNOSIS — I70335 Atherosclerosis of unspecified type of bypass graft(s) of the right leg with ulceration of other part of foot: Secondary | ICD-10-CM | POA: Diagnosis not present

## 2024-05-27 DIAGNOSIS — M86171 Other acute osteomyelitis, right ankle and foot: Secondary | ICD-10-CM | POA: Diagnosis present

## 2024-05-27 DIAGNOSIS — Z86718 Personal history of other venous thrombosis and embolism: Secondary | ICD-10-CM | POA: Diagnosis not present

## 2024-05-27 DIAGNOSIS — Z79899 Other long term (current) drug therapy: Secondary | ICD-10-CM | POA: Diagnosis not present

## 2024-05-27 DIAGNOSIS — E872 Acidosis, unspecified: Secondary | ICD-10-CM | POA: Diagnosis not present

## 2024-05-27 DIAGNOSIS — M869 Osteomyelitis, unspecified: Secondary | ICD-10-CM | POA: Diagnosis present

## 2024-05-27 DIAGNOSIS — T82856A Stenosis of peripheral vascular stent, initial encounter: Secondary | ICD-10-CM | POA: Diagnosis present

## 2024-05-27 DIAGNOSIS — I480 Paroxysmal atrial fibrillation: Secondary | ICD-10-CM | POA: Diagnosis present

## 2024-05-27 DIAGNOSIS — M79604 Pain in right leg: Secondary | ICD-10-CM | POA: Diagnosis not present

## 2024-05-27 DIAGNOSIS — Y832 Surgical operation with anastomosis, bypass or graft as the cause of abnormal reaction of the patient, or of later complication, without mention of misadventure at the time of the procedure: Secondary | ICD-10-CM | POA: Diagnosis present

## 2024-05-27 DIAGNOSIS — Z9889 Other specified postprocedural states: Secondary | ICD-10-CM | POA: Diagnosis not present

## 2024-05-27 DIAGNOSIS — E1169 Type 2 diabetes mellitus with other specified complication: Secondary | ICD-10-CM | POA: Diagnosis present

## 2024-05-27 DIAGNOSIS — E11621 Type 2 diabetes mellitus with foot ulcer: Secondary | ICD-10-CM

## 2024-05-27 DIAGNOSIS — Z89512 Acquired absence of left leg below knee: Secondary | ICD-10-CM | POA: Diagnosis not present

## 2024-05-27 DIAGNOSIS — I2510 Atherosclerotic heart disease of native coronary artery without angina pectoris: Secondary | ICD-10-CM | POA: Diagnosis present

## 2024-05-27 DIAGNOSIS — E1140 Type 2 diabetes mellitus with diabetic neuropathy, unspecified: Secondary | ICD-10-CM | POA: Diagnosis present

## 2024-05-27 DIAGNOSIS — Z833 Family history of diabetes mellitus: Secondary | ICD-10-CM | POA: Diagnosis not present

## 2024-05-27 DIAGNOSIS — E1151 Type 2 diabetes mellitus with diabetic peripheral angiopathy without gangrene: Secondary | ICD-10-CM | POA: Diagnosis present

## 2024-05-27 DIAGNOSIS — I70235 Atherosclerosis of native arteries of right leg with ulceration of other part of foot: Secondary | ICD-10-CM | POA: Diagnosis not present

## 2024-05-27 DIAGNOSIS — Z794 Long term (current) use of insulin: Secondary | ICD-10-CM | POA: Diagnosis not present

## 2024-05-27 DIAGNOSIS — Z87891 Personal history of nicotine dependence: Secondary | ICD-10-CM | POA: Diagnosis not present

## 2024-05-27 DIAGNOSIS — Z8249 Family history of ischemic heart disease and other diseases of the circulatory system: Secondary | ICD-10-CM | POA: Diagnosis not present

## 2024-05-27 DIAGNOSIS — T82858A Stenosis of vascular prosthetic devices, implants and grafts, initial encounter: Secondary | ICD-10-CM | POA: Diagnosis present

## 2024-05-27 DIAGNOSIS — I5032 Chronic diastolic (congestive) heart failure: Secondary | ICD-10-CM | POA: Diagnosis present

## 2024-05-27 LAB — LACTIC ACID, PLASMA
Lactic Acid, Venous: 1.3 mmol/L (ref 0.5–1.9)
Lactic Acid, Venous: 2.8 mmol/L (ref 0.5–1.9)

## 2024-05-27 LAB — HEMOGLOBIN A1C
Hgb A1c MFr Bld: 6.5 % — ABNORMAL HIGH (ref 4.8–5.6)
Mean Plasma Glucose: 139.85 mg/dL

## 2024-05-27 LAB — GLUCOSE, CAPILLARY
Glucose-Capillary: 191 mg/dL — ABNORMAL HIGH (ref 70–99)
Glucose-Capillary: 149 mg/dL — ABNORMAL HIGH (ref 70–99)
Glucose-Capillary: 177 mg/dL — ABNORMAL HIGH (ref 70–99)

## 2024-05-27 LAB — CBG MONITORING, ED: Glucose-Capillary: 121 mg/dL — ABNORMAL HIGH (ref 70–99)

## 2024-05-27 MED ORDER — KETOROLAC TROMETHAMINE 15 MG/ML IJ SOLN
15.0000 mg | Freq: Four times a day (QID) | INTRAMUSCULAR | Status: DC | PRN
  Administered 2024-05-27 – 2024-05-28 (×2): 15 mg via INTRAVENOUS
  Filled 2024-05-27 (×3): qty 1

## 2024-05-27 MED ORDER — ACETAMINOPHEN 325 MG PO TABS
650.0000 mg | ORAL_TABLET | Freq: Four times a day (QID) | ORAL | Status: DC | PRN
  Administered 2024-05-27 – 2024-06-01 (×4): 650 mg via ORAL
  Filled 2024-05-27 (×4): qty 2

## 2024-05-27 MED ORDER — ONDANSETRON HCL 4 MG/2ML IJ SOLN
4.0000 mg | Freq: Four times a day (QID) | INTRAMUSCULAR | Status: DC | PRN
Start: 2024-05-27 — End: 2024-06-03

## 2024-05-27 MED ORDER — SODIUM CHLORIDE 0.9 % IV BOLUS
500.0000 mL | Freq: Once | INTRAVENOUS | Status: AC
  Administered 2024-05-27: 500 mL via INTRAVENOUS

## 2024-05-27 MED ORDER — INSULIN ASPART 100 UNIT/ML IJ SOLN
0.0000 [IU] | Freq: Three times a day (TID) | INTRAMUSCULAR | Status: DC
  Administered 2024-05-27: 4 [IU] via SUBCUTANEOUS
  Administered 2024-05-27 – 2024-05-28 (×2): 3 [IU] via SUBCUTANEOUS
  Administered 2024-05-29: 7 [IU] via SUBCUTANEOUS
  Administered 2024-05-30: 4 [IU] via SUBCUTANEOUS
  Administered 2024-05-30: 7 [IU] via SUBCUTANEOUS
  Administered 2024-05-31 (×3): 4 [IU] via SUBCUTANEOUS
  Administered 2024-06-01: 3 [IU] via SUBCUTANEOUS
  Administered 2024-06-01 – 2024-06-02 (×5): 4 [IU] via SUBCUTANEOUS
  Administered 2024-06-03 (×2): 3 [IU] via SUBCUTANEOUS
  Filled 2024-05-27 (×17): qty 1

## 2024-05-27 MED ORDER — GABAPENTIN 100 MG PO CAPS
100.0000 mg | ORAL_CAPSULE | Freq: Three times a day (TID) | ORAL | Status: DC
  Administered 2024-05-27 – 2024-05-30 (×8): 100 mg via ORAL
  Filled 2024-05-27 (×9): qty 1

## 2024-05-27 MED ORDER — INSULIN ASPART 100 UNIT/ML IJ SOLN
0.0000 [IU] | Freq: Every day | INTRAMUSCULAR | Status: DC
  Administered 2024-05-28: 3 [IU] via SUBCUTANEOUS
  Filled 2024-05-27: qty 1

## 2024-05-27 MED ORDER — LOSARTAN POTASSIUM 25 MG PO TABS
25.0000 mg | ORAL_TABLET | Freq: Every day | ORAL | Status: DC
  Administered 2024-05-27 – 2024-06-03 (×7): 25 mg via ORAL
  Filled 2024-05-27 (×7): qty 1

## 2024-05-27 MED ORDER — ENOXAPARIN SODIUM 40 MG/0.4ML IJ SOSY
40.0000 mg | PREFILLED_SYRINGE | INTRAMUSCULAR | Status: DC
  Administered 2024-05-27 – 2024-05-30 (×2): 40 mg via SUBCUTANEOUS
  Filled 2024-05-27 (×2): qty 0.4

## 2024-05-27 MED ORDER — SENNOSIDES-DOCUSATE SODIUM 8.6-50 MG PO TABS: 1.0000 | ORAL_TABLET | Freq: Every evening | ORAL | Status: DC | PRN

## 2024-05-27 MED ORDER — CLOPIDOGREL BISULFATE 75 MG PO TABS
75.0000 mg | ORAL_TABLET | Freq: Every day | ORAL | Status: DC
  Administered 2024-05-27 – 2024-06-03 (×8): 75 mg via ORAL
  Filled 2024-05-27 (×9): qty 1

## 2024-05-27 MED ORDER — ONDANSETRON HCL 4 MG PO TABS: 4.0000 mg | ORAL_TABLET | Freq: Four times a day (QID) | ORAL | Status: DC | PRN

## 2024-05-27 MED ORDER — GADOBUTROL 1 MMOL/ML IV SOLN
10.0000 mL | Freq: Once | INTRAVENOUS | Status: AC | PRN
Start: 2024-05-27 — End: 2024-05-27
  Administered 2024-05-27: 10 mL via INTRAVENOUS

## 2024-05-27 MED ORDER — NICOTINE 21 MG/24HR TD PT24: 21.0000 mg | MEDICATED_PATCH | TRANSDERMAL | Status: DC

## 2024-05-27 NOTE — ED Notes (Signed)
 Pt difficult stick. RN called phlebotomy for Blood Culture.

## 2024-05-27 NOTE — Consult Note (Signed)
 PODIATRY CONSULTATION  NAME Austin [name] MRN [id number] DOB September 07, 1956 DOA 05/27/2024   Reason for consult: ulcer RT foot Chief Complaint  Patient presents with   Leg Pain    History of present illness: 68 y.o. male PMHx diabetes mellitus, PVD, LT BKA, partial fifth ray amputation RT foot 2023, PAF on Eliquis , admitted for worsening infection right foot.  Brief history: Symptoms started about 4-5 days ago, patient started develop ulcer on the bottom of right foot, he was prescribed with 2 weeks of doxycycline  late July to early August, and went to see podiatry on August 7, a office debridement was done and patient was sent to wound care center and was instructed to use Betadine  to clean the wound every day and wear cam boot, which patient has followed.  Despite, patient continued to experience worsening of right foot pain.  He denied any fever chills.  He did not see significant discharge or pus coming out from the wound.   Past Medical History:  Diagnosis Date   Atrial fibrillation [facility])    Coronary artery disease    Diabetes (HCC)    DVT (deep venous thrombosis) (HCC)    Gangrene of left foot (HCC) 02/09/2018   GERD (gastroesophageal reflux disease)    Hyperlipidemia    Hypertension    Peripheral vascular disease (HCC)        Latest Ref Rng & Units 05/26/2024   10:05 PM 05/03/2024    4:50 PM 03/01/2024    4:06 AM  CBC  WBC 4.0 - 10.5 K/uL 9.6  8.5  15.9   Hemoglobin 13.0 - 17.0 g/dL 85.9  86.4  87.1   Hematocrit 39.0 - 52.0 % 42.9  41.5  38.4   Platelets 150 - 400 K/uL 222  212  167        Latest Ref Rng & Units 05/26/2024   10:05 PM 05/03/2024    4:50 PM 03/02/2024    4:08 AM  BMP  Glucose 70 - 99 mg/dL 835  878  829   BUN 8 - 23 mg/dL 10  12  13    Creatinine 0.61 - 1.24 mg/dL 9.14  9.16  9.21   Sodium 135 - 145 mmol/L 141  145  140   Potassium 3.5 - 5.1 mmol/L 4.1  4.1  3.7   Chloride 98 - 111 mmol/L 106  108  105   CO2 22 - 32 mmol/L 26  25  26    Calcium  8.9  - 10.3 mg/dL 9.3  9.6  8.3     RT foot 05/03/2024   Physical Exam: General: The patient is alert and oriented x3 in no acute distress.   Dermatology: Draining ulcer noted plantar aspect of the fourth MTP right foot.  Surrounding edema and erythema.  No significant malodor.  Serous purulent drainage noted  Vascular: VAS US  LOWER EXTREMITY ARTERIAL DUPLEX 12/10/2023 +-----------+--------+-----+--------+----------+--------+  RIGHT     PSV cm/sRatioStenosisWaveform  Comments  +-----------+--------+-----+--------+----------+--------+  CIA Prox   139                  monophasic          +-----------+--------+-----+--------+----------+--------+  EIA Prox   213                  monophasic          +-----------+--------+-----+--------+----------+--------+  EIA Mid    162  monophasic          +-----------+--------+-----+--------+----------+--------+  EIA Distal 200                  monophasic          +-----------+--------+-----+--------+----------+--------+  CFA Distal 208                  monophasic          +-----------+--------+-----+--------+----------+--------+  DFA       118                  biphasic            +-----------+--------+-----+--------+----------+--------+  SFA Prox   0            occluded                    +-----------+--------+-----+--------+----------+--------+  POP Prox   0            occluded                    +-----------+--------+-----+--------+----------+--------+  ATA Distal 37                   monophasic          +-----------+--------+-----+--------+----------+--------+  PERO Prox  55                   monophasic          +-----------+--------+-----+--------+----------+--------+  PERO Mid   59                   monophasic          +-----------+--------+-----+--------+----------+--------+  PERO Distal48                   monophasic           +-----------+--------+-----+--------+----------+--------+    Right Graft #1: Fem to tib per trunk bypass graft  +------------------+--------+--------+----------+---------+                   PSV cm/sStenosisWaveform  Comments   +------------------+--------+--------+----------+---------+  Inflow           211             biphasic             +------------------+--------+--------+----------+---------+  Prox Anastomosis  173             monophasichyperemic  +------------------+--------+--------+----------+---------+  Proximal Graft    116             monophasic           +------------------+--------+--------+----------+---------+  Mid Graft         112             monophasichyperemic  +------------------+--------+--------+----------+---------+  Distal Graft      154             monophasic           +------------------+--------+--------+----------+---------+  Distal Anastomosis63              monophasic           +------------------+--------+--------+----------+---------+  Outflow          117             monophasic           +------------------+--------+--------+----------+---------+  Summary:  Right: Patent stent with no evidence of stenosis in the PFA and peroneal  artery. Patent right fem-  tib per trunk bpg with no obvious restenosis.   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  Op Note by Jama Cordella MATSU, MD at 12/05/2023  1:55 PM  Findings:  The abdominal aorta is opacified with a bolus injection contrast. The aorta itself has diffuse disease but no hemodynamically significant lesions. The common and external iliac arteries are widely patent bilaterally.   The right common femoral is widely patent but the profunda femoris has a greater than 90% stenosis in its mid to distal main trunk as  described above.  Following angioplasty and stent placement to 4 mm there is less than 5% residual stenosis..  The SFA is occluded.  The vein bypass initially appears to be occluded with 2 critical lesions noted proximally near the femoral anastomosis.  In the end this appeared to be an occlusion of the peroneal distal to the bypass which is the single-vessel runoff prevented any forward flow throughout the entire bypass.  Following angioplasty of the proximal 2 lesions in the vein bypass as described above there is wide patency with less than 10% residual stenosis.  Following angioplasty and stent placement using coronary stents of the peroneal artery there is an excellent result with rapid flow of contrast less than 10% residual stenosis and filling of the foot.  Neurological: Light touch and protective threshold diminished  Musculoskeletal Exam: History of prior 4th and 5th toe amputation right foot.  BKA LLE.  DG Foot Complete Right 05/27/2024 FINDINGS: Amputation of the fourth toe at the MTP joint and the fifth toe at the proximal metatarsal. No acute fracture or dislocation. No evidence of osteomyelitis. Swelling about the forefoot.   IMPRESSION: No evidence of osteomyelitis.  MR FOOT RIGHT W WO CONTRAST 05/27/2024 IMPRESSION: 1. 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 2. Plantar draining sinus tract from the fourth metatarsal head to the cutaneous surface. Questionable additional early sinus tract formation extending dorsally and distally from the fourth metatarsal head. 3. Prominent dorsal subcutaneous infiltrative edema in the forefoot. Cellulitis not excluded. 4. Prior amputation of the fourth ray at the MTP joint. Prior amputation of the fifth ray at the mid metatarsal.   ASSESSMENT/PLAN OF CARE Acute osteomyelitis fourth metatarsal right foot Ulcer with draining sinus plantar aspect of the fourth metatarsal head right foot PVD w/ history of lower extremity  angiography  -Patient seen at bedside this evening -Given the longstanding history of PVD he is well-known with the [facility] VVS team. rec vascular consult.  Always greatly appreciated -Given the active osteomyelitis of the head of the fourth metatarsal recommend fourth metatarsal head resection with bone biopsy and incision and drainage of the right foot.  This was discussed with the patient and he is amenable to this plan.  Tentatively plan for surgery tomorrow, 05/28/2024, afternoon pending or availability -Preoperative orders placed.  N.p.o. after midnight -Currently I do not see any active orders for antibiotic. Hold abx until after surgery for more reliable bone biopsy for culture and path.  -Will follow    Thank you for the consult.  Please contact me directly via secure chat with any questions or concerns.     Thresa EMERSON Sar, DPM [facility]  Dr. Thresa EMERSON Sar, DPM    [address]  [place], [place] [address]                Office [phone]  Fax [phone]

## 2024-05-27 NOTE — H&P (Addendum)
 History and Physical    Austin [name] FMW:[id number] DOB: 1955/11/23 DOA: 05/27/2024  PCP: [place], [name] T, MD (Confirm with patient/family/NH records and if not entered, this has to be entered at [facility] point of entry) Patient coming from: Home  I have personally briefly reviewed patient's old medical records in [facility]  Chief Complaint: Right foot pain  HPI: Austin [name] is a 68 y.o. male with medical history significant of PVD status post right common femoral to TP trunk bypass with vein in 2023, status post left BKA, HTN, IDDM, diabetic neuropathy chronic HFpEF, PAF on Eliquis , presented with worsening of right foot pain.  Symptoms started about 4-5 days ago, patient started develop ulcer on the bottom of right foot, he was prescribed with 2 weeks of doxycycline  late July to early August, and went to see podiatry on August 7, a office debridement was done and patient was sent to wound care center and was instructed to use Betadine  to clean the wound every day and wear cam boot, which patient has followed.  Despite, patient continued to experience worsening of right foot pain.  He denied any fever chills.  He did not see significant discharge or pus coming out from the wound.  ED Course: Afebrile, no tachycardia nonhypotensive not hypoxic.  MRI showed acute osteomyelitis of the head of the fourth metatarsal and plantar draining sinus tract from fourth metatarsal head to cutaneous surface.  Blood work showed hemoglobin 14, WBC 9.6 BUN 10 creatinine 0.6 glucose 164.  Review of Systems: As per HPI otherwise 14 point review of systems negative.    Past Medical History:  Diagnosis Date   Atrial fibrillation (HCC)    Coronary artery disease    Diabetes (HCC)    DVT (deep venous thrombosis) (HCC)    Gangrene of left foot (HCC) 02/09/2018   GERD (gastroesophageal reflux disease)    Hyperlipidemia    Hypertension    Peripheral vascular disease (HCC)     Past Surgical History:   Procedure Laterality Date   ABDOMINAL AORTOGRAM W/LOWER EXTREMITY N/A 02/20/2022   Procedure: ABDOMINAL AORTOGRAM W/LOWER EXTREMITY;  Surgeon: Gretta Lonni PARAS, MD;  [facility];  Service: Cardiovascular;  Laterality: N/A;   AMPUTATION Left 10/28/2018   Procedure: AMPUTATION BELOW KNEE;  Surgeon: Marea Selinda RAMAN, MD;  Location: [facility];  Service: Vascular;  Laterality: Left;   AMPUTATION Left 09/10/2021   Procedure: AMPUTATION DIGIT;  Surgeon: Elisabeth Craig RAMAN, MD;  Location: [facility];  Service: Plastics;  Laterality: Left;   AMPUTATION Left 09/14/2021   Procedure: AMPUTATION DIGIT left long finger and irrigation and debridement;  Surgeon: Elisabeth Craig RAMAN, MD;  Location: [facility];  Service: Plastics;  Laterality: Left;   AMPUTATION Right 02/27/2022   Procedure: RIGHT PARTIAL AMPUTATION FOOT;  Surgeon: Janit Thresa HERO, DPM;  Location: [facility];  Service: Podiatry;  Laterality: Right;   AMPUTATION TOE Left 02/10/2018   Procedure: AMPUTATION TOE;  Surgeon: Neill Boas, DPM;  Location: [facility];  Service: Podiatry;  Laterality: Left;   AMPUTATION TOE Right 11/06/2023   Procedure: Right 4th Toe Amputation;  Surgeon: Malvin Marsa FALCON, DPM;  Location: [facility];  Service: Orthopedics/Podiatry;  Laterality: Right;   APPLICATION OF WOUND VAC Left 10/16/2018   Procedure: APPLICATION OF WOUND VAC;  Surgeon: Ashley Soulier, DPM;  Location: [facility];  Service: Podiatry;  Laterality: Left;   DORSAL SLIT N/A 01/12/2021   Procedure: DORSAL SLIT;  Surgeon: Francisca Redell BROCKS, MD;  Location:  [facility];  Service: Urology;  Laterality: N/A;   FEMORAL-TIBIAL BYPASS GRAFT Right 02/25/2022   Procedure: RIGHT LOWER EXTREMITY FEMORAL TO TIBIAL PERONEAL TRUNK BYPASS;  Surgeon: Gretta Lonni PARAS, MD;  Location: [facility];  Service: Vascular;  Laterality: Right;  INSERT ARTERIAL LINE   groin surgery     IRRIGATION AND DEBRIDEMENT FOOT Left 09/30/2018   Procedure: IRRIGATION AND DEBRIDEMENT FOOT;  Surgeon: Neill Boas,  DPM;  Location: [facility];  Service: Podiatry;  Laterality: Left;   LOWER EXTREMITY ANGIOGRAPHY Left 02/12/2018   Procedure: Lower Extremity Angiography;  Surgeon: Marea Selinda RAMAN, MD;  Location: [facility];  Service: Cardiovascular;  Laterality: Left;   LOWER EXTREMITY ANGIOGRAPHY Left 10/01/2018   Procedure: Lower Extremity Angiography;  Surgeon: Marea Selinda RAMAN, MD;  Location: [facility];  Service: Cardiovascular;  Laterality: Left;   LOWER EXTREMITY ANGIOGRAPHY Left 10/19/2018   Procedure: Lower Extremity Angiography;  Surgeon: Marea Selinda RAMAN, MD;  Location: [facility];  Service: Cardiovascular;  Laterality: Left;   LOWER EXTREMITY ANGIOGRAPHY Left 10/20/2018   Procedure: LOWER EXTREMITY ANGIOGRAPHY;  Surgeon: Marea Selinda RAMAN, MD;  Location: [facility];  Service: Cardiovascular;  Laterality: Left;   LOWER EXTREMITY ANGIOGRAPHY Right 11/25/2018   Procedure: LOWER EXTREMITY ANGIOGRAPHY;  Surgeon: Marea Selinda RAMAN, MD;  Location: [facility];  Service: Cardiovascular;  Laterality: Right;   LOWER EXTREMITY ANGIOGRAPHY Right 01/10/2020   Procedure: LOWER EXTREMITY ANGIOGRAPHY;  Surgeon: Marea Selinda RAMAN, MD;  Location: [facility];  Service: Cardiovascular;  Laterality: Right;   LOWER EXTREMITY ANGIOGRAPHY Right 12/11/2020   Procedure: LOWER EXTREMITY ANGIOGRAPHY;  Surgeon: Marea Selinda RAMAN, MD;  Location: [facility];  Service: Cardiovascular;  Laterality: Right;   LOWER EXTREMITY ANGIOGRAPHY Right 06/26/2023   Procedure: Lower Extremity Angiography;  Surgeon: Jama Cordella MATSU, MD;  Location: [facility];  Service: Cardiovascular;  Laterality: Right;   LOWER EXTREMITY ANGIOGRAPHY Right 11/05/2023   Procedure: Lower Extremity Angiography;  Surgeon: Marea Selinda RAMAN, MD;  Location: [facility];  Service: Cardiovascular;  Laterality: Right;   LOWER EXTREMITY ANGIOGRAPHY Right 12/05/2023   Procedure: Lower Extremity Angiography;  Surgeon: Jama Cordella MATSU, MD;  Location: [facility];  Service: Cardiovascular;  Laterality: Right;   LOWER EXTREMITY INTERVENTION  02/12/2018   Procedure: LOWER EXTREMITY INTERVENTION;  Surgeon: Marea Selinda RAMAN, MD;  Location: [facility];  Service: Cardiovascular;;   MINOR IRRIGATION AND DEBRIDEMENT OF WOUND Left 09/10/2021   Procedure: IRRIGATION AND DEBRIDEMENT OF LEFT LONG FINGER WOUND;  Surgeon: Elisabeth Craig RAMAN, MD;  Location: [facility];  Service: Plastics;  Laterality: Left;   NECK SURGERY     PERIPHERAL VASCULAR INTERVENTION  02/20/2022   Procedure: PERIPHERAL VASCULAR INTERVENTION;  Surgeon: Gretta Lonni PARAS, MD;  [facility]: [facility];  Service: Cardiovascular;;  right iliac   TRANSMETATARSAL AMPUTATION Left 10/16/2018   Procedure: TRANSMETATARSAL AMPUTATION LEFT FOOT;  Surgeon: Ashley Soulier, DPM;  Location: [facility];  Service: Podiatry;  Laterality: Left;   VEIN HARVEST Right 02/25/2022   Procedure: VEIN HARVEST OF RIGHT GREATER SAPHENOUS VEIN;  Surgeon: Gretta Lonni PARAS, MD;  Location: [facility];  Service: Vascular;  Laterality: Right;     reports that he quit smoking about 3 years ago. His smoking use included cigarettes. He started smoking about 50 years ago. He has a 47 pack-year smoking history. He has never used smokeless tobacco. He reports that he does  not drink alcohol and does not use drugs.  No Known Allergies  Family History  Problem Relation Age of Onset   Diabetes Mother    Coronary artery disease Father    Hypertension Sister    Leukemia Brother      Prior to Admission medications   Medication Sig Start Date End Date Taking? Authorizing Provider  acetaminophen  (TYLENOL ) 325 MG tablet Take 2 tablets (650 mg total) by mouth every 6 (six) hours as needed for mild pain (pain score 1-3) or fever. 03/02/24   Josette Ade, MD  ascorbic acid  (VITAMIN C ) 500 MG tablet Take 1 tablet (500 mg total) by mouth 2 (two) times daily. 03/21/22   Love, Sharlet RAMAN, PA-C   Cholecalciferol  (VITAMIN D3) 125 MCG (5000 UT) CAPS Take 1 capsule by mouth daily. 04/17/22   [provider]  clopidogrel  (PLAVIX ) 75 MG tablet Take 1 tablet (75 mg total) by mouth daily at 6 (six) AM. 12/10/23   Delores Orvin BRAVO, NP  doxycycline  (VIBRA -TABS) 100 MG tablet Take 1 tablet (100 mg total) by mouth 2 (two) times daily. 05/03/24   Arlander Charleston, MD  ELIQUIS  5 MG TABS tablet Take 5 mg by mouth 2 (two) times daily. 10/21/23   [provider]  gabapentin  (NEURONTIN ) 100 MG capsule Take 100 mg by mouth 3 (three) times daily.    [provider]  HUMALOG  KWIKPEN 100 UNIT/ML KwikPen Inject 12 Units into the skin 3 (three) times daily. 03/02/24   Josette Ade, MD  insulin  glargine (LANTUS ) 100 UNIT/ML Solostar Pen Inject 45 Units into the skin at bedtime. 03/02/24   Josette Ade, MD  losartan  (COZAAR ) 25 MG tablet Take 1 tablet (25 mg total) by mouth daily. 03/21/22   Love, Sharlet RAMAN, PA-C  nicotine  (NICODERM CQ  - DOSED IN MG/24 HOURS) 21 mg/24hr patch Place 1 patch (21 mg total) onto the skin daily. 12/06/23 12/05/24  Barbarann Nest, MD  TRULICITY 1.5 MG/0.5ML SOAJ Inject 1.5 mg into the skin once a week. 11/12/23   [provider]  Zinc  Sulfate 220 (50 Zn) MG TABS Take 1 tablet (220 mg total) by mouth daily after supper. 03/21/22   Maurice Sharlet RAMAN, PA-C    Physical Exam: Vitals:   05/26/24 2203 05/27/24 0441 05/27/24 0730 05/27/24 0838  BP: (!) 149/95 (!) 144/73 (!) 144/88   Pulse: 69 94 (!) 52   Resp: 18 16 17    Temp: 98 F (36.7 C) 97.8 F (36.6 C)  98 F (36.7 C)  TempSrc: Oral Oral    SpO2: 100% 100% 99%   Weight:        Constitutional: NAD, calm, comfortable Vitals:   05/26/24 2203 05/27/24 0441 05/27/24 0730 05/27/24 0838  BP: (!) 149/95 (!) 144/73 (!) 144/88   Pulse: 69 94 (!) 52   Resp: 18 16 17    Temp: 98 F (36.7 C) 97.8 F (36.6 C)  98 F (36.7 C)  TempSrc: Oral Oral    SpO2: 100% 100% 99%   Weight:       Eyes: PERRL, lids and  conjunctivae normal ENMT: Mucous membranes are moist. Posterior pharynx clear of any exudate or lesions.Normal dentition.  Neck: normal, supple, no masses, no thyromegaly Respiratory: clear to auscultation bilaterally, no wheezing, no crackles. Normal respiratory effort. No accessory muscle use.  Cardiovascular: Regular rate and rhythm, no murmurs / rubs / gallops. No extremity edema. 2+ pedal pulses. No carotid bruits.  Abdomen: no tenderness, no masses palpated. No hepatosplenomegaly. Bowel sounds  positive.  Musculoskeletal: no clubbing / cyanosis. No joint deformity upper and lower extremities. Good ROM, no contractures. Normal muscle tone.  Skin: Ulcer of bottom of right foot near the fourth metatarsal Neurologic: CN 2-12 grossly intact. Sensation intact, DTR normal. Strength 5/5 in all 4.  Psychiatric: Normal judgment and insight. Alert and oriented x 3. Normal mood.     Labs on Admission: I have personally reviewed following labs and imaging studies  CBC: Recent Labs  Lab 05/26/24 2205  WBC 9.6  NEUTROABS 6.0  HGB 14.0  HCT 42.9  MCV 84.0  PLT 222   Basic Metabolic Panel: Recent Labs  Lab 05/26/24 2205  NA 141  K 4.1  CL 106  CO2 26  GLUCOSE 164*  BUN 10  CREATININE 0.85  CALCIUM  9.3   GFR: Estimated Creatinine Clearance: 106 mL/min (by C-G formula based on SCr of 0.85 mg/dL). Liver Function Tests: Recent Labs  Lab 05/26/24 2205  AST 18  ALT 15  ALKPHOS 72  BILITOT <0.2  PROT 7.2  ALBUMIN 3.4*   No results for input(s): LIPASE, AMYLASE in the last 168 hours. No results for input(s): AMMONIA in the last 168 hours. Coagulation Profile: No results for input(s): INR, PROTIME in the last 168 hours. Cardiac Enzymes: No results for input(s): CKTOTAL, CKMB, CKMBINDEX, TROPONINI in the last 168 hours. BNP (last 3 results) No results for input(s): PROBNP in the last 8760 hours. HbA1C: No results for input(s): HGBA1C in the last 72  hours. CBG: Recent Labs  Lab 05/27/24 0622  GLUCAP 121*   Lipid Profile: No results for input(s): CHOL, HDL, LDLCALC, TRIG, CHOLHDL, LDLDIRECT in the last 72 hours. Thyroid Function Tests: No results for input(s): TSH, T4TOTAL, FREET4, T3FREE, THYROIDAB in the last 72 hours. Anemia Panel: No results for input(s): VITAMINB12, FOLATE, FERRITIN, TIBC, IRON, RETICCTPCT in the last 72 hours. Urine analysis:    Component Value Date/Time   COLORURINE YELLOW (A) 02/29/2024 1702   APPEARANCEUR CLOUDY (A) 02/29/2024 1702   APPEARANCEUR Cloudy (A) 11/20/2020 1611   LABSPEC 1.016 02/29/2024 1702   LABSPEC 1.024 11/12/2013 2001   PHURINE 5.0 02/29/2024 1702   GLUCOSEU 50 (A) 02/29/2024 1702   GLUCOSEU >=500 11/12/2013 2001   HGBUR MODERATE (A) 02/29/2024 1702   BILIRUBINUR NEGATIVE 02/29/2024 1702   BILIRUBINUR Negative 11/20/2020 1611   BILIRUBINUR Negative 11/12/2013 2001   KETONESUR NEGATIVE 02/29/2024 1702   PROTEINUR 100 (A) 02/29/2024 1702   NITRITE NEGATIVE 02/29/2024 1702   LEUKOCYTESUR MODERATE (A) 02/29/2024 1702   LEUKOCYTESUR Negative 11/12/2013 2001    Radiological Exams on Admission: MR FOOT RIGHT W WO CONTRAST Result Date: 05/27/2024 CLINICAL DATA:  Open wound along the ball of the foot.  Diabetes. EXAM: MRI OF THE RIGHT FOREFOOT WITHOUT AND WITH CONTRAST TECHNIQUE: Multiplanar, multisequence MR imaging of the right foot from the midfoot through the toes was performed before and after the administration of intravenous contrast. CONTRAST:  10mL GADAVIST  GADOBUTROL  1 MMOL/ML IV SOLN COMPARISON:  05/27/2024 radiographs FINDINGS: Bones/Joint/Cartilage Prior amputation of the fourth ray at the MTP joint. Prior amputation of the fifth ray at the mid metatarsal. Abnormal marrow edema and enhancement in the head of the fourth metatarsal compatible with active osteomyelitis. Ligaments Lisfranc ligament intact. Muscles and Tendons Generalized regional  muscular atrophy. Soft tissues Plantar draining sinus tract from the fourth metatarsal head to the cutaneous surface, image 27 series 10. Questionable additional early sinus tract formation extending dorsally and distally from the metatarsal head on images  15 through 17 of series 12. Prominent dorsal subcutaneous infiltrative edema in the forefoot. Cellulitis not excluded. This tracks into the remaining toes. IMPRESSION: 1. 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 2. Plantar draining sinus tract from the fourth metatarsal head to the cutaneous surface. Questionable additional early sinus tract formation extending dorsally and distally from the fourth metatarsal head. 3. Prominent dorsal subcutaneous infiltrative edema in the forefoot. Cellulitis not excluded. 4. Prior amputation of the fourth ray at the MTP joint. Prior amputation of the fifth ray at the mid metatarsal. Electronically Signed   By: Ryan Salvage M.D.   On: 05/27/2024 08:48   US  Venous Img Lower Unilateral Right Result Date: 05/27/2024 CLINICAL DATA:  Right lower extremity edema EXAM: Right LOWER EXTREMITY VENOUS DOPPLER ULTRASOUND TECHNIQUE: Gray-scale sonography with compression, as well as color and duplex ultrasound, were performed to evaluate the deep venous system(s) from the level of the common femoral vein through the popliteal and proximal calf veins. COMPARISON:  None Available. FINDINGS: VENOUS Normal compressibility of the common femoral, superficial femoral, and popliteal veins, as well as the visualized calf veins. Visualized portions of profunda femoral vein is unremarkable. The great saphenous vein was not visualized. No filling defects to suggest DVT on grayscale or color Doppler imaging. Doppler waveforms show normal direction of venous flow, normal respiratory plasticity and response to augmentation. Limited views of the contralateral common femoral vein are unremarkable. OTHER 1.1 cm short axis node in the  right groin is likely reactive. Limitations: none IMPRESSION: Negative for acute DVT. Electronically Signed   By: Norman Gatlin M.D.   On: 05/27/2024 02:20   DG Foot Complete Right Result Date: 05/27/2024 CLINICAL DATA:  Right foot pain, plantar wound EXAM: RIGHT FOOT COMPLETE - 3+ VIEW COMPARISON:  05/03/2024 FINDINGS: Amputation of the fourth toe at the MTP joint and the fifth toe at the proximal metatarsal. No acute fracture or dislocation. No evidence of osteomyelitis. Swelling about the forefoot. IMPRESSION: No evidence of osteomyelitis. Electronically Signed   By: Norman Gatlin M.D.   On: 05/27/2024 01:07    EKG: None  Assessment/Plan Principal Problem:   Osteomyelitis (HCC) Active Problems:   Acute osteomyelitis of right foot (HCC)  (please populate well all problems here in Problem List. (For example, if patient is on BP meds at home and you resume or decide to hold them, it is a problem that needs to be her. Same for CAD, COPD, HLD and so on)  Acute right fourth metatarsal osteomyelitis Acute on chronic right foot diabetic ulcer infection - Failed outpatient management - Discussed with on-call podiatry, who recommended hold off antibiotics as patient does not have symptoms signs of sepsis.  Podiatry will see the patient and decide I&D plan. - Will check ABI and consider vascular surgery consult given the history of PVD and surgical intervention of bypass 2 years ago.  IDDM -SSI -Hold off Lantus  as glucose level borderline low  HTN - Stable, continue home BP measurement  History of PVD - Continue Plavix   PAF - In sinus rhythm - Hold off Eliquis  for incoming surgical intervention  Total time spent on patient care 55 minutes  DVT prophylaxis: Lovenox  Code Status: Full code Family Communication: None at bedside Disposition Plan: Expect more than 2 midnight hospital stay, as patient is sick with acute osteomyelitis requiring IV antibiotics and podiatry  intervention Consults called: Podiatry Admission status: MedSurg admission   Cort ONEIDA Mana MD [facility] Pager [phone]  05/27/2024, 9:23  AM

## 2024-05-27 NOTE — ED Provider Notes (Signed)
 [facility] Provider Note    Event Date/Time   First MD Initiated Contact with Patient 05/27/24 [phone]     (approximate)   History   Chief Complaint: Leg Pain   HPI  Austin [name] is a 68 y.o. male with a history of atrial fibrillation, diabetes, hypertension, PVD who comes ED complaining of right leg pain below the knee radiating down to the foot.  Also has a wound on the bottom of the right foot which has been managed by home wound care nursing visits.  Foot is becoming more painful, painful to bear weight on.  Denies fever.  No chest pain or shortness of breath        Past Medical History:  Diagnosis Date   Atrial fibrillation (HCC)    Coronary artery disease    Diabetes (HCC)    DVT (deep venous thrombosis) (HCC)    Gangrene of left foot (HCC) 02/09/2018   GERD (gastroesophageal reflux disease)    Hyperlipidemia    Hypertension    Peripheral vascular disease [facility])     Current Outpatient Rx   Order #: [id number] Class: OTC   Order #: [id number] Class: Normal   Order #: [id number] Class: Historical Med   Order #: [id number] Class: Normal   Order #: [id number] Class: Normal   Order #: [id number] Class: Historical Med   Order #: [id number] Class: Historical Med   Order #: [id number] Class: No Print   Order #: [id number] Class: No Print   Order #: [id number] Class: Normal   Order #: [id number] Class: Normal   Order #: [id number] Class: Historical Med   Order #: [id number] Class: Normal    Past Surgical History:  Procedure Laterality Date   ABDOMINAL AORTOGRAM W/LOWER EXTREMITY N/A 02/20/2022   Procedure: ABDOMINAL AORTOGRAM W/LOWER EXTREMITY;  Surgeon: Gretta Lonni PARAS, MD;  Location: [facility] INVASIVE CV LAB;  Service: Cardiovascular;  Laterality: N/A;   AMPUTATION Left 10/28/2018   Procedure: AMPUTATION BELOW KNEE;  Surgeon: Marea Selinda RAMAN, MD;  Location: [facility] ORS;  Service: Vascular;  Laterality: Left;   AMPUTATION Left 09/10/2021   Procedure: AMPUTATION DIGIT;   Surgeon: Elisabeth Craig RAMAN, MD;  Location: [facility] ORS;  Service: Plastics;  Laterality: Left;   AMPUTATION Left 09/14/2021   Procedure: AMPUTATION DIGIT left long finger and irrigation and debridement;  Surgeon: Elisabeth Craig RAMAN, MD;  Location: [facility] ORS;  Service: Plastics;  Laterality: Left;   AMPUTATION Right 02/27/2022   Procedure: RIGHT PARTIAL AMPUTATION FOOT;  Surgeon: Janit Thresa HERO, DPM;  Location: [facility] OR;  Service: Podiatry;  Laterality: Right;   AMPUTATION TOE Left 02/10/2018   Procedure: AMPUTATION TOE;  Surgeon: Neill Boas, DPM;  Location: [facility] ORS;  Service: Podiatry;  Laterality: Left;   AMPUTATION TOE Right 11/06/2023   Procedure: Right 4th Toe Amputation;  Surgeon: Malvin Marsa FALCON, DPM;  Location: [facility] ORS;  Service: Orthopedics/Podiatry;  Laterality: Right;   APPLICATION OF WOUND VAC Left 10/16/2018   Procedure: APPLICATION OF WOUND VAC;  Surgeon: Ashley Soulier, DPM;  Location: [facility] ORS;  Service: Podiatry;  Laterality: Left;   DORSAL SLIT N/A 01/12/2021   Procedure: DORSAL SLIT;  Surgeon: Francisca Redell BROCKS, MD;  Location: [facility] ORS;  Service: Urology;  Laterality: N/A;   FEMORAL-TIBIAL BYPASS GRAFT Right 02/25/2022   Procedure: RIGHT LOWER EXTREMITY FEMORAL TO TIBIAL PERONEAL TRUNK BYPASS;  Surgeon: Gretta Lonni PARAS, MD;  Location: [facility] OR;  Service: Vascular;  Laterality: Right;  INSERT ARTERIAL LINE   groin surgery     IRRIGATION AND DEBRIDEMENT FOOT Left 09/30/2018  Procedure: IRRIGATION AND DEBRIDEMENT FOOT;  Surgeon: Neill Boas, DPM;  Location: [facility] ORS;  Service: Podiatry;  Laterality: Left;   LOWER EXTREMITY ANGIOGRAPHY Left 02/12/2018   Procedure: Lower Extremity Angiography;  Surgeon: Marea Selinda RAMAN, MD;  Location: [facility] INVASIVE CV LAB;  Service: Cardiovascular;  Laterality: Left;   LOWER EXTREMITY ANGIOGRAPHY Left 10/01/2018   Procedure: Lower Extremity Angiography;  Surgeon: Marea Selinda RAMAN, MD;  Location: [facility] INVASIVE CV LAB;  Service: Cardiovascular;  Laterality: Left;   LOWER  EXTREMITY ANGIOGRAPHY Left 10/19/2018   Procedure: Lower Extremity Angiography;  Surgeon: Marea Selinda RAMAN, MD;  Location: [facility] INVASIVE CV LAB;  Service: Cardiovascular;  Laterality: Left;   LOWER EXTREMITY ANGIOGRAPHY Left 10/20/2018   Procedure: LOWER EXTREMITY ANGIOGRAPHY;  Surgeon: Marea Selinda RAMAN, MD;  Location: [facility] INVASIVE CV LAB;  Service: Cardiovascular;  Laterality: Left;   LOWER EXTREMITY ANGIOGRAPHY Right 11/25/2018   Procedure: LOWER EXTREMITY ANGIOGRAPHY;  Surgeon: Marea Selinda RAMAN, MD;  Location: [facility] INVASIVE CV LAB;  Service: Cardiovascular;  Laterality: Right;   LOWER EXTREMITY ANGIOGRAPHY Right 01/10/2020   Procedure: LOWER EXTREMITY ANGIOGRAPHY;  Surgeon: Marea Selinda RAMAN, MD;  Location: [facility] INVASIVE CV LAB;  Service: Cardiovascular;  Laterality: Right;   LOWER EXTREMITY ANGIOGRAPHY Right 12/11/2020   Procedure: LOWER EXTREMITY ANGIOGRAPHY;  Surgeon: Marea Selinda RAMAN, MD;  Location: [facility] INVASIVE CV LAB;  Service: Cardiovascular;  Laterality: Right;   LOWER EXTREMITY ANGIOGRAPHY Right 06/26/2023   Procedure: Lower Extremity Angiography;  Surgeon: Jama Cordella MATSU, MD;  Location: [facility] INVASIVE CV LAB;  Service: Cardiovascular;  Laterality: Right;   LOWER EXTREMITY ANGIOGRAPHY Right 11/05/2023   Procedure: Lower Extremity Angiography;  Surgeon: Marea Selinda RAMAN, MD;  Location: [facility] INVASIVE CV LAB;  Service: Cardiovascular;  Laterality: Right;   LOWER EXTREMITY ANGIOGRAPHY Right 12/05/2023   Procedure: Lower Extremity Angiography;  Surgeon: Jama Cordella MATSU, MD;  Location: [facility] INVASIVE CV LAB;  Service: Cardiovascular;  Laterality: Right;   LOWER EXTREMITY INTERVENTION  02/12/2018   Procedure: LOWER EXTREMITY INTERVENTION;  Surgeon: Marea Selinda RAMAN, MD;  Location: [facility] INVASIVE CV LAB;  Service: Cardiovascular;;   MINOR IRRIGATION AND DEBRIDEMENT OF WOUND Left 09/10/2021   Procedure: IRRIGATION AND DEBRIDEMENT OF LEFT LONG FINGER WOUND;  Surgeon: Elisabeth Craig RAMAN, MD;  Location: [facility] OR[facility];  Service: Plastics;   Laterality: Left;   NECK SURGERY     PERIPHERAL VASCULAR INTERVENTION  02/20/2022   Procedure: PERIPHERAL VASCULAR INTERVENTION;  Surgeon: Gretta Lonni PARAS, MD;  Location: [facility] INVASIVE CV LAB;  Service: Cardiovascular;;  right iliac   TRANSMETATARSAL AMPUTATION Left 10/16/2018   Procedure: TRANSMETATARSAL AMPUTATION LEFT FOOT;  Surgeon: Ashley Soulier, DPM;  Location: [facility] OR[facility];  Service: Podiatry;  Laterality: Left;   VEIN HARVEST Right 02/25/2022   Procedure: VEIN HARVEST OF RIGHT GREATER SAPHENOUS VEIN;  Surgeon: Gretta Lonni PARAS, MD;  Location: [facility] OR;  Service: Vascular;  Laterality: Right;    Physical Exam   Triage Vital Signs: ED Triage Vitals  Encounter Vitals Group     BP 05/26/24 2203 (!) 149/95     Girls Systolic BP Percentile --      Girls Diastolic BP Percentile --      Boys Systolic BP Percentile --      Boys Diastolic BP Percentile --      Pulse Rate 05/26/24 2203 69     Resp 05/26/24 2203 18     Temp 05/26/24 2203 98 F (36.7 C)     Temp Source 05/26/24 2203 Oral  SpO2 05/26/24 2203 100 %     Weight 05/26/24 2202 240 lb (108.9 kg)     Height --      Head Circumference --      Peak Flow --      Pain Score 05/26/24 2202 8     Pain Loc --      Pain Education --      Exclude from Growth Chart --     Most recent vital signs: Vitals:   05/26/24 2203 05/27/24 0441  BP: (!) 149/95 (!) 144/73  Pulse: 69 94  Resp: 18 16  Temp: 98 F (36.7 C) 97.8 F (36.6 C)  SpO2: 100% 100%    General: Awake, no distress.  CV:  Sluggish right foot cap refill 4 seconds.  Right foot is warm.  Regular rate and rhythm. Resp:  Normal effort.  Abd:  No distention.  Other:  1+ pitting edema right lower extremity.  Left lower extremity status post BKA.  3 cm right foot plantar ulceration laterally.  Macerated tissue, central necrosis.   ED Results / Procedures / Treatments   Labs (all labs ordered are listed, but only abnormal results are displayed) Labs Reviewed   COMPREHENSIVE METABOLIC PANEL WITH GFR - Abnormal; Notable for the following components:      Result Value   Glucose, Bld 164 (*)    Albumin 3.4 (*)    All other components within normal limits  CBC WITH DIFFERENTIAL/PLATELET - Abnormal; Notable for the following components:   RDW 16.1 (*)    All other components within normal limits  CBG MONITORING, ED - Abnormal; Notable for the following components:   Glucose-Capillary 121 (*)    All other components within normal limits     EKG    RADIOLOGY X-ray right foot interpreted by me, negative for signs of osteomyelitis or fracture.  Radiology report reviewed   PROCEDURES:  Procedures   MEDICATIONS ORDERED IN ED: Medications  gadobutrol  (GADAVIST ) 1 MMOL/ML injection 10 mL (10 mLs Intravenous Contrast Given 05/27/24 0602)     IMPRESSION / MDM / ASSESSMENT AND PLAN / ED COURSE  I reviewed the triage vital signs and the nursing notes.  DDx: Foot abscess, osteomyelitis, foot fracture, soft tissue ulceration, right leg DVT  Patient's presentation is most consistent with acute presentation with potential threat to life or bodily function.  Patient presents with pain and swelling in the right lower leg along with a plantar wound on the right foot.  X-ray of the foot negative for signs of osteomyelitis.  Will obtain MRI foot.  Will ultrasound right lower extremity to evaluate for DVT.   Clinical Course as of 05/27/24 0701  Thu May 27, 2024  0649 Ho DM left BKA, has wound on plantar foot. Has visiting nursing wound care at home. Pending MRI, will need podiatry.  [HD]    Clinical Course User Index [HD] Nicholaus Rolland BRAVO, MD     FINAL CLINICAL IMPRESSION(S) / ED DIAGNOSES   Final diagnoses:  Ulcer of right foot, unspecified ulcer stage (HCC)  Type 2 diabetes mellitus with foot ulcer, with long-term current use of insulin  (HCC)     Rx / DC Orders   ED Discharge Orders     None        Note:  This document was  prepared using Dragon voice recognition software and may include unintentional dictation errors.   Viviann Pastor, MD 05/27/24 [phone]

## 2024-05-27 NOTE — ED Provider Notes (Signed)
 Clinical Course as of 05/27/24 1000  Thu May 27, 2024  0649 Ho DM left BKA, has wound on plantar foot. Has visiting nursing wound care at home. Pending MRI, will need podiatry.  [HD]  [id number] I met the patient who was sleeping soundly.  Patient does have good cap refill in all digits.  He does have an ulceration on the plantar surface of his right midfoot.  Does not have purulent drainage no surrounding erythema but does appear to tunnel deeper than I Can full access. Awaiting MRI report [HD]  0717 US  Venous Img Lower Unilateral Right Large lymph note in right groin [HD]    Clinical Course User Index [HD] Austin Rolland BRAVO, MD   MRI did demonstrate osteomyelitis of the fourth metatarsal.  Given patient's consistent inherent stability we will hold off on IV antibiotics at this time pending consultant recommendations in case of need for biopsy.  Case discussed with hospitalist for admission -- Data(2/3 categories following were performed): I reviewed or ordered at least three unique tests, external notes, and/or the history required an independent historian as one of the three requirements as following: At least 3 labs/imaging studies were obtained and/or reviewed. AND  I discussed the management of the patient with the following external physician or qualified healthcare provider: Admitting physician  Risk: This patient has a high risk of morbidity due to further diagnostic testing or treatment. Rationale: Decision made regarding admission  Admit Level 5 - Labs/Rads, Admit, Consult:  Suggested E/M Coding Level: 5, 99285  This level has been selected based on the 06-14-2022 CPT guidelines for E/M codes in the Emergency Department based on 2/3 of the CoPA, Data, and Risk.    Austin Rolland BRAVO, MD 05/27/24 1001

## 2024-05-27 NOTE — ED Notes (Signed)
Pt on phone with mri  

## 2024-05-27 NOTE — ED Notes (Signed)
 Pt resting at this time with eyes closed and resp even and unlabored.

## 2024-05-28 ENCOUNTER — Encounter
Admission: EM | Disposition: A | Discharge status: Skilled Nursing Facility | Payer: Self-pay | Source: Home / Self Care | Attending: Internal Medicine

## 2024-05-28 DIAGNOSIS — Z87891 Personal history of nicotine dependence: Secondary | ICD-10-CM

## 2024-05-28 DIAGNOSIS — I70335 Atherosclerosis of unspecified type of bypass graft(s) of the right leg with ulceration of other part of foot: Secondary | ICD-10-CM | POA: Diagnosis not present

## 2024-05-28 DIAGNOSIS — M79604 Pain in right leg: Secondary | ICD-10-CM | POA: Diagnosis not present

## 2024-05-28 DIAGNOSIS — Z9889 Other specified postprocedural states: Secondary | ICD-10-CM

## 2024-05-28 DIAGNOSIS — L97519 Non-pressure chronic ulcer of other part of right foot with unspecified severity: Secondary | ICD-10-CM | POA: Diagnosis not present

## 2024-05-28 DIAGNOSIS — T82856A Stenosis of peripheral vascular stent, initial encounter: Secondary | ICD-10-CM

## 2024-05-28 DIAGNOSIS — M86171 Other acute osteomyelitis, right ankle and foot: Secondary | ICD-10-CM | POA: Diagnosis not present

## 2024-05-28 DIAGNOSIS — Z79899 Other long term (current) drug therapy: Secondary | ICD-10-CM

## 2024-05-28 DIAGNOSIS — Z7902 Long term (current) use of antithrombotics/antiplatelets: Secondary | ICD-10-CM

## 2024-05-28 DIAGNOSIS — Z89512 Acquired absence of left leg below knee: Secondary | ICD-10-CM

## 2024-05-28 DIAGNOSIS — Z794 Long term (current) use of insulin: Secondary | ICD-10-CM

## 2024-05-28 DIAGNOSIS — I70235 Atherosclerosis of native arteries of right leg with ulceration of other part of foot: Secondary | ICD-10-CM | POA: Diagnosis not present

## 2024-05-28 DIAGNOSIS — Z7901 Long term (current) use of anticoagulants: Secondary | ICD-10-CM

## 2024-05-28 DIAGNOSIS — Z89421 Acquired absence of other right toe(s): Secondary | ICD-10-CM

## 2024-05-28 HISTORY — PX: LOWER EXTREMITY ANGIOGRAPHY: CATH118251

## 2024-05-28 LAB — BASIC METABOLIC PANEL WITH GFR
Anion gap: 8 (ref 5–15)
BUN: 14 mg/dL (ref 8–23)
CO2: 25 mmol/L (ref 22–32)
Calcium: 8.9 mg/dL (ref 8.9–10.3)
Chloride: 105 mmol/L (ref 98–111)
Creatinine, Ser: 0.89 mg/dL (ref 0.61–1.24)
GFR, Estimated: >60 mL/min (ref >60)
Glucose, Bld: 122 mg/dL — ABNORMAL HIGH (ref 70–99)
Potassium: 4.1 mmol/L (ref 3.5–5.1)
Sodium: 138 mmol/L (ref 135–145)

## 2024-05-28 LAB — CBC WITH DIFFERENTIAL/PLATELET
Abs Immature Granulocytes: 0.03 K/uL (ref 0.00–0.07)
Basophils Absolute: 0 K/uL (ref 0.0–0.1)
Basophils Relative: 0 %
Eosinophils Absolute: 0.1 K/uL (ref 0.0–0.5)
Eosinophils Relative: 1 %
HCT: 39.8 % (ref 39.0–52.0)
Hemoglobin: 13.4 g/dL (ref 13.0–17.0)
Immature Granulocytes: 0 %
Lymphocytes Relative: 32 %
Lymphs Abs: 2.7 K/uL (ref 0.7–4.0)
MCH: 27.3 pg (ref 26.0–34.0)
MCHC: 33.7 g/dL (ref 30.0–36.0)
MCV: 81.1 fL (ref 80.0–100.0)
Monocytes Absolute: 0.6 K/uL (ref 0.1–1.0)
Monocytes Relative: 7 %
Neutro Abs: 5.1 K/uL (ref 1.7–7.7)
Neutrophils Relative %: 60 %
Platelets: 192 K/uL (ref 150–400)
RBC: 4.91 MIL/uL (ref 4.22–5.81)
RDW: 15.2 % (ref 11.5–15.5)
WBC: 8.5 K/uL (ref 4.0–10.5)
nRBC: 0 % (ref 0.0–0.2)

## 2024-05-28 LAB — GLUCOSE, CAPILLARY
Glucose-Capillary: 120 mg/dL — ABNORMAL HIGH (ref 70–99)
Glucose-Capillary: 141 mg/dL — ABNORMAL HIGH (ref 70–99)
Glucose-Capillary: 114 mg/dL — ABNORMAL HIGH (ref 70–99)
Glucose-Capillary: 118 mg/dL — ABNORMAL HIGH (ref 70–99)
Glucose-Capillary: 287 mg/dL — ABNORMAL HIGH (ref 70–99)

## 2024-05-28 LAB — LACTIC ACID, PLASMA: Lactic Acid, Venous: 1.4 mmol/L (ref 0.5–1.9)

## 2024-05-28 LAB — PROTIME-INR
INR: 1.1 (ref 0.8–1.2)
Prothrombin Time: 14.8 s (ref 11.4–15.2)

## 2024-05-28 SURGERY — LOWER EXTREMITY ANGIOGRAPHY
Anesthesia: Moderate Sedation | Laterality: Right

## 2024-05-28 MED ORDER — NITROGLYCERIN 1 MG/10 ML FOR IR/CATH LAB
INTRA_ARTERIAL | Status: AC
  Filled 2024-05-28: qty 10

## 2024-05-28 MED ORDER — MIDAZOLAM HCL 2 MG/ML PO SYRP: 8.0000 mg | ORAL_SOLUTION | Freq: Once | ORAL | Status: DC | PRN

## 2024-05-28 MED ORDER — FENTANYL CITRATE (PF) 100 MCG/2ML IJ SOLN
INTRAMUSCULAR | Status: DC | PRN
  Administered 2024-05-28 (×2): 25 ug via INTRAVENOUS
  Administered 2024-05-28: 50 ug via INTRAVENOUS

## 2024-05-28 MED ORDER — NITROGLYCERIN 1 MG/10 ML FOR IR/CATH LAB
INTRA_ARTERIAL | Status: DC | PRN
Start: 2024-05-28 — End: 2024-05-28
  Administered 2024-05-28: 300 ug via INTRA_ARTERIAL
  Administered 2024-05-28: 400 ug via INTRA_ARTERIAL

## 2024-05-28 MED ORDER — SODIUM CHLORIDE 0.9 % IV SOLN: INTRAVENOUS | Status: DC

## 2024-05-28 MED ORDER — ATORVASTATIN CALCIUM 20 MG PO TABS
40.0000 mg | ORAL_TABLET | Freq: Every day | ORAL | Status: DC
  Administered 2024-05-28 – 2024-06-03 (×6): 40 mg via ORAL
  Filled 2024-05-28 (×6): qty 2

## 2024-05-28 MED ORDER — METHYLPREDNISOLONE SODIUM SUCC 125 MG IJ SOLR: 125.0000 mg | Freq: Once | INTRAMUSCULAR | Status: DC | PRN

## 2024-05-28 MED ORDER — MORPHINE SULFATE (PF) 2 MG/ML IV SOLN: 2.0000 mg | INTRAVENOUS | Status: DC | PRN

## 2024-05-28 MED ORDER — ASPIRIN 81 MG PO TBEC
DELAYED_RELEASE_TABLET | ORAL | Status: AC
Start: 2024-05-28 — End: 2024-05-29
  Filled 2024-05-28: qty 1

## 2024-05-28 MED ORDER — IODIXANOL 320 MG/ML IV SOLN
INTRAVENOUS | Status: DC | PRN
  Administered 2024-05-28: 70 mL

## 2024-05-28 MED ORDER — KETOROLAC TROMETHAMINE 15 MG/ML IJ SOLN
15.0000 mg | Freq: Four times a day (QID) | INTRAMUSCULAR | Status: AC
  Administered 2024-05-28 – 2024-05-30 (×7): 15 mg via INTRAVENOUS
  Filled 2024-05-28 (×7): qty 1

## 2024-05-28 MED ORDER — LIDOCAINE-EPINEPHRINE (PF) 1 %-1:200000 IJ SOLN
INTRAMUSCULAR | Status: DC | PRN
Start: 2024-05-28 — End: 2024-05-28
  Administered 2024-05-28: 10 mL

## 2024-05-28 MED ORDER — CEFAZOLIN SODIUM-DEXTROSE 2-4 GM/100ML-% IV SOLN
2.0000 g | INTRAVENOUS | Status: AC
  Administered 2024-05-28: 2 g via INTRAVENOUS

## 2024-05-28 MED ORDER — MIDAZOLAM HCL 2 MG/2ML IJ SOLN
INTRAMUSCULAR | Status: DC | PRN
Start: 2024-05-28 — End: 2024-05-28
  Administered 2024-05-28: 1 mg via INTRAVENOUS
  Administered 2024-05-28 (×2): .5 mg via INTRAVENOUS

## 2024-05-28 MED ORDER — OXYCODONE HCL 5 MG PO TABS
5.0000 mg | ORAL_TABLET | ORAL | Status: DC | PRN
  Administered 2024-05-28 – 2024-05-29 (×3): 10 mg via ORAL
  Administered 2024-05-29: 5 mg via ORAL
  Administered 2024-05-30 – 2024-05-31 (×9): 10 mg via ORAL
  Administered 2024-06-01: 5 mg via ORAL
  Administered 2024-06-01: 10 mg via ORAL
  Administered 2024-06-01 – 2024-06-02 (×3): 5 mg via ORAL
  Filled 2024-05-28: qty 1
  Filled 2024-05-28 (×3): qty 2
  Filled 2024-05-28 (×2): qty 1
  Filled 2024-05-28 (×3): qty 2
  Filled 2024-05-28: qty 1
  Filled 2024-05-28 (×7): qty 2
  Filled 2024-05-28: qty 1
  Filled 2024-05-28: qty 2

## 2024-05-28 MED ORDER — FAMOTIDINE 20 MG PO TABS: 40.0000 mg | ORAL_TABLET | Freq: Once | ORAL | Status: DC | PRN

## 2024-05-28 MED ORDER — HEPARIN (PORCINE) IN NACL 1000-0.9 UT/500ML-% IV SOLN
INTRAVENOUS | Status: DC | PRN
  Administered 2024-05-28: 1000 mL

## 2024-05-28 MED ORDER — ASPIRIN 81 MG PO TBEC
81.0000 mg | DELAYED_RELEASE_TABLET | Freq: Every day | ORAL | Status: DC
  Administered 2024-05-28 – 2024-05-30 (×2): 81 mg via ORAL
  Filled 2024-05-28: qty 1

## 2024-05-28 MED ORDER — MIDAZOLAM HCL 5 MG/5ML IJ SOLN
INTRAMUSCULAR | Status: AC
  Filled 2024-05-28: qty 5

## 2024-05-28 MED ORDER — FENTANYL CITRATE (PF) 100 MCG/2ML IJ SOLN
INTRAMUSCULAR | Status: AC
  Filled 2024-05-28: qty 2

## 2024-05-28 MED ORDER — FENTANYL CITRATE PF 50 MCG/ML IJ SOSY
PREFILLED_SYRINGE | INTRAMUSCULAR | Status: AC
  Filled 2024-05-28: qty 1

## 2024-05-28 MED ORDER — HEPARIN SODIUM (PORCINE) 1000 UNIT/ML IJ SOLN
INTRAMUSCULAR | Status: AC
  Filled 2024-05-28: qty 10

## 2024-05-28 MED ORDER — HEPARIN SODIUM (PORCINE) 1000 UNIT/ML IJ SOLN
INTRAMUSCULAR | Status: DC | PRN
  Administered 2024-05-28: 4000 [IU] via INTRAVENOUS

## 2024-05-28 MED ORDER — DIPHENHYDRAMINE HCL 50 MG/ML IJ SOLN: 50.0000 mg | Freq: Once | INTRAMUSCULAR | Status: DC | PRN

## 2024-05-28 SURGICAL SUPPLY — 16 items
BALLOON LUTONIX 4X150X130 (BALLOONS) IMPLANT
BALLOON LUTONIX DCB 4X60X130 (BALLOONS) IMPLANT
CATH ANGIO 5F PIGTAIL 65CM (CATHETERS) IMPLANT
COVER PROBE ULTRASOUND 5X96 (MISCELLANEOUS) IMPLANT
DEVICE PRESTO INFLATION (MISCELLANEOUS) IMPLANT
DEVICE VASC CLSR CELT ART 6 (Vascular Products) IMPLANT
GLIDEWIRE ADV .035X260CM (WIRE) IMPLANT
GUIDEWIRE AMPLATZ SHORT (WIRE) IMPLANT
PACK ANGIOGRAPHY (CUSTOM PROCEDURE TRAY) ×1 IMPLANT
SHEATH ANL2 6FRX45 HC (SHEATH) IMPLANT
SHEATH BRITE TIP 4FRX11 (SHEATH) IMPLANT
SHEATH BRITE TIP 5FRX11 (SHEATH) IMPLANT
SHEATH BRITE TIP 6FRX11 (SHEATH) IMPLANT
SHEATH BRITE TIP 6FRX5.5 (SHEATH) IMPLANT
TUBING CONTRAST HIGH PRESS 72 (TUBING) IMPLANT
WIRE J 3MM .035X145CM (WIRE) IMPLANT

## 2024-05-28 NOTE — Op Note (Signed)
 [facility]  Percutaneous Study/Intervention Procedural Note   Date of Surgery: 05/28/2024  Surgeon(s):Cassy Sprowl    Assistants:none  Pre-operative Diagnosis: PAD with ulceration right lower extremity  Post-operative diagnosis:  Same  Procedure(s) Performed:             1.  Ultrasound guidance for vascular access left femoral artery             2.  Catheter placement into right common femoral artery from left femoral approach             3.  Aortogram and selective right lower extremity angiogram             4.  Percutaneous transluminal angioplasty of proximal portion of right femoral to distal bypass analogous to the right common femoral artery/most proximal SFA with 4 mm diameter by 6 cm length [name] drug-coated angioplasty balloon             5.  Percutaneous transluminal angioplasty of distal portion of right femoral to distal bypass analogous to the right tibioperoneal trunk and most distal popliteal artery with 4 mm diameter by 15 cm length [name] drug-coated angioplasty balloon  6.  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  He has already lost the left leg below the knee. The patient is brought in for angiography for further evaluation and potential treatment.  Due to the limb threatening nature of the situation, angiogram was performed for attempted limb salvage. The patient is aware that if the procedure fails, amputation would be expected.  The patient also understands that even with  successful revascularization, amputation may still be required due to the severity of the situation.  Risks and benefits are discussed and informed consent is obtained.   Procedure:  The patient was identified and appropriate procedural time out was performed.  The patient was then placed supine on the table and prepped and draped in the usual sterile fashion. Moderate conscious sedation was administered during a face to face encounter with the patient throughout the procedure with my supervision of the RN administering medicines and monitoring the patient's vital signs, pulse oximetry, telemetry and mental status throughout from the start of the procedure until the patient was taken to the recovery room. Ultrasound was used to evaluate the left common femoral artery.  It was patent .  A digital ultrasound image was acquired.  A Seldinger needle was used to access the left common femoral artery under direct ultrasound guidance and a permanent image was performed.  A 0.035 J wire was advanced without resistance and a 5Fr sheath was placed.  Pigtail catheter was placed into the aorta and an AP aortogram was performed. This demonstrated normal renal arteries and normal aorta and iliac segments without significant stenosis and patency of the previous iliac stents. I then crossed the aortic bifurcation and advanced to the right femoral head. Selective right lower extremity angiogram was then performed. This demonstrated patency of the right  common femoral artery and the profunda femoris artery after previous stenting.  The right SFA and the previous stent were occluded.  There was a femoral to distal bypass plugged into the tibioperoneal trunk.  There was about a 70 to 80% stenosis within a few centimeters of the origin of the bypass analogous to the distal common femoral artery or most proximal SFA.  The bypass was then patent down to the level of the knee where there were 2 areas of stenosis 1 about 10 cm from the  anastomosis and 1 just 2 or 3 cm from the anastomosis.  The more proximal was about 60% and the more distal was about 80%.  This was analogous to the below-knee popliteal artery and proximal tibioperoneal trunk.  The bypass was then tied into the tibioperoneal trunk but the stent below this was occluded and the peroneal and posterior tibial arteries were diminutive with essentially no flow distally.  There was retrograde filling into the anterior tibial artery through collaterals and retrograde flow into the proximal anterior tibial artery and the anterior tibial artery was large and patent into the foot. It was felt that it was in the patient's best interest to proceed with intervention after these images to avoid a second procedure and a larger amount of contrast and fluoroscopy based off of the findings from the initial angiogram. The patient was systemically heparinized and a 6 [place] An[place] sheath was then placed over the [employer] wire. I then used a Kumpe catheter and the advantage wire to get into the bypass and crossed both the proximal and the distal areas of stenosis without difficulty.  The proximal lesion analogous to the distal common femoral artery and proximal SFA was addressed with a 4 mm diameter by 6 cm length [name] drug-coated angioplasty balloon inflated to 12 atm for 1 minute.  Completion imaging following this showed only about a 20 to 25% residual stenosis.  I then turned my attention to the more distal lesion analogous to the below-knee popliteal artery and tibioperoneal trunk.  A 4 mm diameter by 15 cm length [name] drug-coated angioplasty balloon was used to address both of these distal lesions simultaneously.  This was inflated to 12 atm for 1 minute.  Completion imaging showed marked improvement with only about a 20% residual stenosis in the lesion just below the knee and about a 25 to 30% residual stenosis in the more distal lesion analogous to the tibioperoneal trunk.  This  was likely as optimized as we can make his blood flow. I elected to terminate the procedure. The sheath was removed and Celt closure device was deployed in the left femoral artery with excellent hemostatic result. The patient was taken to the recovery room in stable condition having tolerated the procedure well.  Findings:               Aortogram:  This demonstrated normal renal arteries and normal aorta and iliac segments without significant stenosis and patency of the previous iliac stents.             Right Lower Extremity:  This demonstrated patency of the right common femoral artery and the profunda femoris artery after previous stenting.  The right SFA and the previous stent were occluded.  There was a femoral to distal bypass plugged into the tibioperoneal trunk.  There was about a 70 to 80% stenosis within a few centimeters of the origin of the bypass analogous to the distal common femoral artery or most proximal  SFA.  The bypass was then patent down to the level of the knee where there were 2 areas of stenosis 1 about 10 cm from the anastomosis and 1 just 2 or 3 cm from the anastomosis.  The more proximal was about 60% and the more distal was about 80%.  This was analogous to the below-knee popliteal artery and proximal tibioperoneal trunk.  The bypass was then tied into the tibioperoneal trunk but the stent below this was occluded and the peroneal and posterior tibial arteries were diminutive with essentially no flow distally.  There was retrograde filling into the anterior tibial artery through collaterals and retrograde flow into the proximal anterior tibial artery and the anterior tibial artery was large and patent into the foot.   Disposition: Patient was taken to the recovery room in stable condition having tolerated the procedure well.  Complications: None  Selinda Gu 05/28/2024 4:01 PM   This note was created with Dragon Medical transcription system. Any errors in dictation are purely  unintentional.

## 2024-05-28 NOTE — Progress Notes (Signed)
 Dr. Marea in at bedside, speaking with pt. Re: procedural results. Pt. Verbalized understanding of conversation with MD. Pt. Set up now for late lunch post Celt closure device. Left groin clean, dry, intact . Report called to [employer], [employer] fund[employer].

## 2024-05-28 NOTE — Consult Note (Signed)
 Hospital Consult    Reason for Consult:  Right Foot Infection/ulcer Requesting Physician:  Dr Thresa Sar MD  MRN #:  [id number]  History of Present Illness: This is a 68 y.o. male PMHx diabetes mellitus, PVD, LT BKA, partial fifth ray amputation RT foot 2023, PAF on Eliquis , admitted for worsening infection right foot. Symptoms started about 4-5 days ago, patient started develop ulcer on the bottom of right foot, he was prescribed with 2 weeks of doxycycline  late July to early August, and went to see podiatry on August 7, a office debridement was done and patient was sent to wound care center and was instructed to use Betadine  to clean the wound every day and wear cam boot, which patient has followed. Despite, patient continued to experience worsening of right foot pain. Patient completed Arterial Ultrasound with ABI's on 05/27/24 showing an index of 0.77 with monophasic waveforms. Vascular Surgery consulted to evaluate.   Past Medical History:  Diagnosis Date   Atrial fibrillation (HCC)    Coronary artery disease    Diabetes (HCC)    DVT (deep venous thrombosis) (HCC)    Gangrene of left foot (HCC) 02/09/2018   GERD (gastroesophageal reflux disease)    Hyperlipidemia    Hypertension    Peripheral vascular disease (HCC)     Past Surgical History:  Procedure Laterality Date   ABDOMINAL AORTOGRAM W/LOWER EXTREMITY N/A 02/20/2022   Procedure: ABDOMINAL AORTOGRAM W/LOWER EXTREMITY;  Surgeon: Gretta Lonni PARAS, MD;  Location: [facility];  Service: Cardiovascular;  Laterality: N/A;   AMPUTATION Left 10/28/2018   Procedure: AMPUTATION BELOW KNEE;  Surgeon: Marea Selinda RAMAN, MD;  Location: [facility];  Service: Vascular;  Laterality: Left;   AMPUTATION Left 09/10/2021   Procedure: AMPUTATION DIGIT;  Surgeon: Elisabeth Craig RAMAN, MD;  Location: [facility];  Service: Plastics;  Laterality: Left;   AMPUTATION Left 09/14/2021   Procedure: AMPUTATION DIGIT left long finger and irrigation and  debridement;  Surgeon: Elisabeth Craig RAMAN, MD;  Location: [facility];  Service: Plastics;  Laterality: Left;   AMPUTATION Right 02/27/2022   Procedure: RIGHT PARTIAL AMPUTATION FOOT;  Surgeon: Sar Thresa HERO, DPM;  Location: [facility];  Service: Podiatry;  Laterality: Right;   AMPUTATION TOE Left 02/10/2018   Procedure: AMPUTATION TOE;  Surgeon: Neill Boas, DPM;  Location: [facility];  Service: Podiatry;  Laterality: Left;   AMPUTATION TOE Right 11/06/2023   Procedure: Right 4th Toe Amputation;  Surgeon: Malvin Marsa FALCON, DPM;  Location: [facility];  Service: Orthopedics/Podiatry;  Laterality: Right;   APPLICATION OF WOUND VAC Left 10/16/2018   Procedure: APPLICATION OF WOUND VAC;  Surgeon: Ashley Soulier, DPM;  Location: [facility];  Service: Podiatry;  Laterality: Left;   DORSAL SLIT N/A 01/12/2021   Procedure: DORSAL SLIT;  Surgeon: Francisca Redell BROCKS, MD;  Location: [facility];  Service: Urology;  Laterality: N/A;   FEMORAL-TIBIAL BYPASS GRAFT Right 02/25/2022   Procedure: RIGHT LOWER EXTREMITY FEMORAL TO TIBIAL PERONEAL TRUNK BYPASS;  Surgeon: Gretta Lonni PARAS, MD;  Location: [facility];  Service: Vascular;  Laterality: Right;  INSERT ARTERIAL LINE   groin surgery     IRRIGATION AND DEBRIDEMENT FOOT Left 09/30/2018   Procedure: IRRIGATION AND DEBRIDEMENT FOOT;  Surgeon: Neill Boas, DPM;  Location: [facility];  Service: Podiatry;  Laterality: Left;   LOWER EXTREMITY ANGIOGRAPHY Left 02/12/2018   Procedure: Lower Extremity Angiography;  Surgeon: Marea Selinda RAMAN, MD;  Location: [facility];  Service: Cardiovascular;  Laterality: Left;   LOWER  EXTREMITY ANGIOGRAPHY Left 10/01/2018   Procedure: Lower Extremity Angiography;  Surgeon: Marea Selinda RAMAN, MD;  Location: [facility];  Service: Cardiovascular;  Laterality: Left;   LOWER EXTREMITY ANGIOGRAPHY Left 10/19/2018   Procedure: Lower Extremity Angiography;  Surgeon: Marea Selinda RAMAN, MD;  Location: [facility];  Service: Cardiovascular;  Laterality:  Left;   LOWER EXTREMITY ANGIOGRAPHY Left 10/20/2018   Procedure: LOWER EXTREMITY ANGIOGRAPHY;  Surgeon: Marea Selinda RAMAN, MD;  Location: [facility];  Service: Cardiovascular;  Laterality: Left;   LOWER EXTREMITY ANGIOGRAPHY Right 11/25/2018   Procedure: LOWER EXTREMITY ANGIOGRAPHY;  Surgeon: Marea Selinda RAMAN, MD;  Location: [facility];  Service: Cardiovascular;  Laterality: Right;   LOWER EXTREMITY ANGIOGRAPHY Right 01/10/2020   Procedure: LOWER EXTREMITY ANGIOGRAPHY;  Surgeon: Marea Selinda RAMAN, MD;  Location: [facility];  Service: Cardiovascular;  Laterality: Right;   LOWER EXTREMITY ANGIOGRAPHY Right 12/11/2020   Procedure: LOWER EXTREMITY ANGIOGRAPHY;  Surgeon: Marea Selinda RAMAN, MD;  Location: [facility];  Service: Cardiovascular;  Laterality: Right;   LOWER EXTREMITY ANGIOGRAPHY Right 06/26/2023   Procedure: Lower Extremity Angiography;  Surgeon: Jama Cordella MATSU, MD;  Location: [facility];  Service: Cardiovascular;  Laterality: Right;   LOWER EXTREMITY ANGIOGRAPHY Right 11/05/2023   Procedure: Lower Extremity Angiography;  Surgeon: Marea Selinda RAMAN, MD;  Location: [facility];  Service: Cardiovascular;  Laterality: Right;   LOWER EXTREMITY ANGIOGRAPHY Right 12/05/2023   Procedure: Lower Extremity Angiography;  Surgeon: Jama Cordella MATSU, MD;  Location: [facility];  Service: Cardiovascular;  Laterality: Right;   LOWER EXTREMITY INTERVENTION  02/12/2018   Procedure: LOWER EXTREMITY INTERVENTION;  Surgeon: Marea Selinda RAMAN, MD;  Location: [facility];  Service: Cardiovascular;;   MINOR IRRIGATION AND DEBRIDEMENT OF WOUND Left 09/10/2021   Procedure: IRRIGATION AND DEBRIDEMENT OF LEFT LONG FINGER WOUND;  Surgeon: Elisabeth Craig RAMAN, MD;  Location: [facility];  Service: Plastics;  Laterality: Left;   NECK SURGERY     PERIPHERAL VASCULAR INTERVENTION  02/20/2022   Procedure: PERIPHERAL VASCULAR INTERVENTION;  Surgeon: Gretta Lonni PARAS, MD;  [facility]: [facility];  Service: Cardiovascular;;  right iliac   TRANSMETATARSAL AMPUTATION Left 10/16/2018   Procedure: TRANSMETATARSAL AMPUTATION LEFT FOOT;  Surgeon: Ashley Soulier, DPM;  Location: [facility];  Service: Podiatry;  Laterality: Left;   VEIN HARVEST Right 02/25/2022   Procedure: VEIN HARVEST OF RIGHT GREATER SAPHENOUS VEIN;  Surgeon: Gretta Lonni PARAS, MD;  Location: [facility];  Service: Vascular;  Laterality: Right;    No Known Allergies  Prior to Admission medications   Medication Sig Start Date End Date Taking? Authorizing Provider  acetaminophen  (TYLENOL ) 325 MG tablet Take 2 tablets (650 mg total) by mouth every 6 (six) hours as needed for mild pain (pain score 1-3) or fever. 03/02/24  Yes Wieting, Richard, MD  ascorbic acid  (VITAMIN C ) 500 MG tablet Take 1 tablet (500 mg total) by mouth 2 (two) times daily. 03/21/22  Yes Love, Sharlet RAMAN, PA-C  Cholecalciferol  (VITAMIN D3) 125 MCG (5000 UT) CAPS Take 1 capsule by mouth daily. 04/17/22  Yes [provider]  clopidogrel  (PLAVIX ) 75 MG tablet Take 1 tablet (75 mg total) by mouth daily at 6 (six) AM. 12/10/23  Yes Brown, Fallon E, NP  ELIQUIS  5 MG TABS tablet Take 5 mg by mouth 2 (two) times daily. 10/21/23  Yes [provider]  gabapentin  (NEURONTIN ) 100 MG capsule Take 100 mg  by mouth 3 (three) times daily.   Yes [provider]  HUMALOG  KWIKPEN 100 UNIT/ML KwikPen Inject 12 Units into the skin 3 (three) times daily. 03/02/24  Yes Wieting, Richard, MD  insulin  glargine (LANTUS ) 100 UNIT/ML Solostar Pen Inject 45 Units into the skin at bedtime. 03/02/24  Yes Wieting, Richard, MD  losartan  (COZAAR ) 25 MG tablet Take 1 tablet (25 mg total) by mouth daily. 03/21/22  Yes Love, Sharlet RAMAN, PA-C  Zinc  Sulfate 220 (50 Zn) MG TABS Take 1 tablet (220 mg total) by mouth daily after supper. 03/21/22  Yes Love, Sharlet RAMAN, PA-C  atorvastatin  (LIPITOR ) 40 MG tablet Take 40 mg by mouth daily. Patient not taking: Reported on 05/27/2024  02/26/24   [provider]  doxycycline  (VIBRA -TABS) 100 MG tablet Take 1 tablet (100 mg total) by mouth 2 (two) times daily. Patient not taking: Reported on 05/27/2024 05/03/24   Arlander Charleston, MD  nicotine  (NICODERM CQ  - DOSED IN MG/24 HOURS) 21 mg/24hr patch Place 1 patch (21 mg total) onto the skin daily. 12/06/23 12/05/24  Barbarann Nest, MD  TRULICITY 1.5 MG/0.5ML SOAJ Inject 1.5 mg into the skin once a week. 11/12/23   [provider]    Social History   Socioeconomic History   Marital status: Divorced    Spouse name: Not on file   Number of children: Not on file   Years of education: Not on file   Highest education level: Not on file  Occupational History   Not on file  Tobacco Use   Smoking status: Former    Current packs/day: 0.00    Average packs/day: 1 pack/day for 47.0 years (47.0 ttl pk-yrs)    Types: Cigarettes    Start date: 01/1974    Quit date: 01/2021    Years since quitting: 3.3   Smokeless tobacco: Never  Vaping Use   Vaping status: Never Used  Substance and Sexual Activity   Alcohol use: No   Drug use: No   Sexual activity: Not Currently  Other Topics Concern   Not on file  Social History Narrative   Not on file   Social Drivers of Health   Financial Resource Strain: Not on file  Food Insecurity: No Food Insecurity (05/27/2024)   Hunger Vital Sign    Worried About Running Out of Food in the Last Year: Never true    Ran Out of Food in the Last Year: Never true  Transportation Needs: No Transportation Needs (05/27/2024)   PRAPARE - Administrator, Civil Service (Medical): No    Lack of Transportation (Non-Medical): No  Physical Activity: Not on file  Stress: Not on file  Social Connections: Moderately Isolated (05/27/2024)   Social Connection and Isolation Panel    Frequency of Communication with Friends and Family: More than three times a week    Frequency of Social Gatherings with Friends and Family: More than three times a  week    Attends Religious Services: 1 to 4 times per year    Active Member of [employer] or Organizations: No    Attends Banker Meetings: Never    Marital Status: Divorced  Catering manager Violence: Not At Risk (05/27/2024)   Humiliation, Afraid, Rape, and Kick questionnaire    Fear of Current or Ex-Partner: No    Emotionally Abused: No    Physically Abused: No    Sexually Abused: No     Family History  Problem Relation Age of Onset   Diabetes  Mother    Coronary artery disease Father    Hypertension Sister    Leukemia Brother     ROS: Otherwise negative unless mentioned in HPI  Physical Examination  Vitals:   05/27/24 2019 05/28/24 0406  BP: (!) 156/69 (!) 157/100  Pulse: 69 66  Resp: 18 19  Temp: 98.1 F (36.7 C) 97.8 F (36.6 C)  SpO2: 100% 99%   Body mass index is 32.55 kg/m.  General:  WDWN in NAD Gait: Not observed HENT: WNL, normocephalic Pulmonary: normal non-labored breathing, without Rales, rhonchi,  wheezing Cardiac: irregular, Atrial Fibrillation, without  Murmurs, rubs or gallops; without carotid bruits Abdomen: Positive bowel sounds throughout, soft, NT/ND, no masses Skin: without rashes Vascular Exam/Pulses: Hx of left BKA with positive femoral pulse, right lower extremity pulses by doppler only. Warm to touch. Ulceration to planter forefoot.  Extremities: with ischemic changes, without Gangrene , with cellulitis; with open wounds;  Musculoskeletal: no muscle wasting or atrophy  Neurologic: A&O X 3;  No focal weakness or paresthesias are detected; speech is fluent/normal Psychiatric:  The pt has Normal affect. Lymph:  Unremarkable  CBC    Component Value Date/Time   WBC 9.6 05/26/2024 2205   RBC 5.11 05/26/2024 2205   HGB 14.0 05/26/2024 2205   HGB 14.1 11/17/2013 0621   HCT 42.9 05/26/2024 2205   HCT 41.0 02/16/2022 0608   PLT 222 05/26/2024 2205   PLT 218 11/17/2013 0621   MCV 84.0 05/26/2024 2205   MCV 86 11/17/2013 0621    MCH 27.4 05/26/2024 2205   MCHC 32.6 05/26/2024 2205   RDW 16.1 (H) 05/26/2024 2205   RDW 13.6 11/17/2013 0621   LYMPHSABS 2.7 05/26/2024 2205   LYMPHSABS 2.4 11/17/2013 0621   MONOABS 0.7 05/26/2024 2205   MONOABS 0.9 11/17/2013 0621   EOSABS 0.1 05/26/2024 2205   EOSABS 0.1 11/17/2013 0621   BASOSABS 0.0 05/26/2024 2205   BASOSABS 0.0 11/17/2013 0621    BMET    Component Value Date/Time   NA 141 05/26/2024 2205   NA 135 (L) 11/17/2013 0621   K 4.1 05/26/2024 2205   K 4.3 11/17/2013 0621   CL 106 05/26/2024 2205   CL 99 11/17/2013 0621   CO2 26 05/26/2024 2205   CO2 31 11/17/2013 0621   GLUCOSE 164 (H) 05/26/2024 2205   GLUCOSE 172 (H) 11/17/2013 0621   BUN 10 05/26/2024 2205   BUN 10 11/17/2013 0621   CREATININE 0.85 05/26/2024 2205   CREATININE 1.05 04/18/2022 1034   CALCIUM  9.3 05/26/2024 2205   CALCIUM  9.6 11/17/2013 0621   GFRNONAA >60 05/26/2024 2205   GFRNONAA >60 11/17/2013 0621   GFRAA >60 01/10/2020 0808   GFRAA >60 11/17/2013 0621    COAGS: Lab Results  Component Value Date   INR 1.2 12/04/2023   INR 1.2 06/25/2023   INR 1.3 (H) 02/15/2022     Non-Invasive Vascular Imaging:   EXAM: Right LOWER EXTREMITY VENOUS DOPPLER ULTRASOUND   TECHNIQUE: Gray-scale sonography with compression, as well as color and duplex ultrasound, were performed to evaluate the deep venous system(s) from the level of the common femoral vein through the popliteal and proximal calf veins.   COMPARISON:  None Available.   FINDINGS: VENOUS   Normal compressibility of the common femoral, superficial femoral, and popliteal veins, as well as the visualized calf veins. Visualized portions of profunda femoral vein is unremarkable. The great saphenous vein was not visualized. No filling defects to suggest DVT on grayscale or  color Doppler imaging. Doppler waveforms show normal direction of venous flow, normal respiratory plasticity and response to augmentation.   Limited  views of the contralateral common femoral vein are unremarkable.   OTHER   1.1 cm short axis node in the right groin is likely reactive.   Limitations: none   IMPRESSION: Negative for acute DVT.  EXAM: NONINVASIVE PHYSIOLOGIC VASCULAR STUDY OF BILATERAL LOWER EXTREMITIES   TECHNIQUE: Evaluation of both lower extremities were performed at rest, including calculation of ankle-brachial indices with single level Doppler, pressure and pulse volume recording.   COMPARISON:  None Available.   FINDINGS: Right ABI:  0.77   Left ABI:  N/A   Right Lower Extremity: Monophasic posterior tibial and dorsalis pedis waveforms.   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  Statin:  Yes.   Beta Blocker:  No. Aspirin :  No. ACEI:  No. ARB:  No. CCB use:  No Other antiplatelets/anticoagulants:  Yes.   Eliquis  5 mg BID and Plavix  75 mg daily    ASSESSMENT/PLAN: This is a 68 y.o. male who presents with right lower extremity pain and an ulcer to his right foot. He has a history of left lower amputation. Patient completed right lower extremity ultrasound revealing poor ABI's of .58   PLAN  Vascular surgery plans on taking the patient to the vascular lab today on 05/28/2024 for right lower extremity angiogram with possible.  I had a long detailed discussion at bedside this morning with the patient describing the procedure, benefits, risk, complications.  Patient verbalizes understanding wishes to proceed as soon as possible due to extreme pain.  I answered all his questions this morning.  Patient has been n.p.o. since midnight last night. Patient is currently on Plavix  75 mg daily last given at 6 AM this morning.  Patient was scheduled to get 40 mg of Lovenox  and that was held.  Patient's last BUN and creatinine were 10/0.85 with a GFR greater than 60.   Patient presented yesterday a lactic acid of 2.8.  -I discussed the case in detail with Dr. Selinda Gu MD and he agrees with the plan.   Gwendlyn JONELLE Shank [facility] 05/28/2024 7:19 AM

## 2024-05-28 NOTE — Plan of Care (Signed)

## 2024-05-28 NOTE — Progress Notes (Signed)
 PROGRESS NOTE    Austin [name]  FMW:[id number] DOB: 01/22/1956 DOA: 05/27/2024 PCP: [place], [name] T, MD    Brief Narrative:  68 y.o. male with medical history significant of PVD status post right common femoral to TP trunk bypass with vein in 2023, status post left BKA, HTN, IDDM, diabetic neuropathy chronic HFpEF, PAF on Eliquis , presented with worsening of right foot pain.   Symptoms started about 4-5 days ago, patient started develop ulcer on the bottom of right foot, he was prescribed with 2 weeks of doxycycline  late July to early August, and went to see podiatry on August 7, a office debridement was done and patient was sent to wound care center and was instructed to use Betadine  to clean the wound every day and wear cam boot, which patient has followed.  Despite, patient continued to experience worsening of right foot pain.  He denied any fever chills.  He did not see significant discharge or pus coming out from the wound.   Assessment & Plan:   Principal Problem:   Osteomyelitis (HCC) Active Problems:   Acute osteomyelitis of right foot (HCC)  Acute right fourth metatarsal osteomyelitis Acute on chronic right foot diabetic ulcer infection - Failed outpatient management - Discussed with on-call podiatry, who recommended hold off antibiotics as patient does not have symptoms signs of sepsis.  Podiatry will see the patient and decide I&D plan. - Vascular surgery consulted Plan: N.p.o. for angio today.  Hold antibiotics.  Podiatry will plan surgical intervention likely this weekend.  Pain control. - Will check ABI and consider vascular surgery consult given the history of PVD and surgical intervention of bypass 2 years ago.   IDDM SSI.  Hold Lantus  for now while NPO   HTN Poor control.  Continue current regimen for now   History of PVD - Continue Plavix    PAF - In sinus rhythm - Holding Eliquis  for incoming surgical intervention    DVT prophylaxis: SCD Code Status:  Full Family Communication: None Disposition Plan: Status is: Inpatient Remains inpatient appropriate because: Multiple acute issues as above   Level of care: Med-Surg  Consultants:  Podiatry Vascular surgery  Procedures:  Lower extremity angiogram 8/22  Antimicrobials: None   Subjective: Seen and examined.  Reports pain in the foot.  Objective: Vitals:   05/27/24 1738 05/27/24 2019 05/28/24 0406 05/28/24 0730  BP: (!) 141/82 (!) 156/69 (!) 157/100 (!) 177/94  Pulse: 98 69 66 84  Resp: 18 18 19 18   Temp: 97.9 F (36.6 C) 98.1 F (36.7 C) 97.8 F (36.6 C) 98.4 F (36.9 C)  TempSrc: Oral   Oral  SpO2: 100% 100% 99% 100%  Weight:        Intake/Output Summary (Last 24 hours) at 05/28/2024 1125 Last data filed at 05/28/2024 9166 Gross per 24 hour  Intake 460 ml  Output 300 ml  Net 160 ml   Filed Weights   05/26/24 2202  Weight: 108.9 kg    Examination:  General exam: Appears calm and comfortable  Respiratory system: Clear to auscultation. Respiratory effort normal. Cardiovascular system: S1-S2, RRR, no murmurs, no pedal edema Gastrointestinal system: Soft, NT/ND, normal bowel sounds. Central nervous system: Alert and oriented. No focal neurological deficits. Extremities: Left BKA.  Draining plantar aspect of right foot Skin: No rashes, lesions or ulcers Psychiatry: Judgement and insight appear normal. Mood & affect appropriate.     Data Reviewed: I have personally reviewed following labs and imaging studies  CBC: Recent Labs  Lab  05/26/24 2205 05/28/24 1014  WBC 9.6 8.5  NEUTROABS 6.0 5.1  HGB 14.0 13.4  HCT 42.9 39.8  MCV 84.0 81.1  PLT 222 192   Basic Metabolic Panel: Recent Labs  Lab 05/26/24 2205 05/28/24 1014  NA 141 138  K 4.1 4.1  CL 106 105  CO2 26 25  GLUCOSE 164* 122*  BUN 10 14  CREATININE 0.85 0.89  CALCIUM  9.3 8.9   GFR: Estimated Creatinine Clearance: 101.2 mL/min (by C-G formula based on SCr of 0.89 mg/dL). Liver  Function Tests: Recent Labs  Lab 05/26/24 2205  AST 18  ALT 15  ALKPHOS 72  BILITOT <0.2  PROT 7.2  ALBUMIN 3.4*   No results for input(s): LIPASE, AMYLASE in the last 168 hours. No results for input(s): AMMONIA in the last 168 hours. Coagulation Profile: Recent Labs  Lab 05/28/24 1014  INR 1.1   Cardiac Enzymes: No results for input(s): CKTOTAL, CKMB, CKMBINDEX, TROPONINI in the last 168 hours. BNP (last 3 results) No results for input(s): PROBNP in the last 8760 hours. HbA1C: Recent Labs    05/26/24 2206  HGBA1C 6.5*   CBG: Recent Labs  Lab 05/27/24 0622 05/27/24 1207 05/27/24 1638 05/27/24 2155 05/28/24 0734  GLUCAP 121* 191* 149* 177* 120*   Lipid Profile: No results for input(s): CHOL, HDL, LDLCALC, TRIG, CHOLHDL, LDLDIRECT in the last 72 hours. Thyroid Function Tests: No results for input(s): TSH, T4TOTAL, FREET4, T3FREE, THYROIDAB in the last 72 hours. Anemia Panel: No results for input(s): VITAMINB12, FOLATE, FERRITIN, TIBC, IRON, RETICCTPCT in the last 72 hours. Sepsis Labs: Recent Labs  Lab 05/27/24 9082 05/27/24 1057 05/28/24 1014  LATICACIDVEN 1.3 2.8* 1.4    Recent Results (from the past 240 hours)  Blood culture (routine x 2)     Status: None (Preliminary result)   Collection Time: 05/27/24 10:57 AM   Specimen: BLOOD  Result Value Ref Range Status   Specimen Description BLOOD RIGHT ANTECUBITAL  Final   Special Requests   Final    BOTTLES DRAWN AEROBIC ONLY Blood Culture results may not be optimal due to an inadequate volume of blood received in culture bottles   Culture   Final    NO GROWTH < 24 HOURS Performed at [facility], [address], [place], [place] [address]    Report Status PENDING  Incomplete  Blood culture (routine x 2)     Status: None (Preliminary result)   Collection Time: 05/27/24 10:57 AM   Specimen: BLOOD  Result Value Ref Range Status   Specimen  Description BLOOD BLOOD RIGHT HAND  Final   Special Requests   Final    BOTTLES DRAWN AEROBIC ONLY Blood Culture results may not be optimal due to an inadequate volume of blood received in culture bottles   Culture   Final    NO GROWTH < 24 HOURS Performed at [facility], [address] [place], [place] [address]    Report Status PENDING  Incomplete         Radiology Studies: US  ARTERIAL ABI (SCREENING LOWER EXTREMITY) Result Date: 05/27/2024 CLINICAL DATA:  Osteomyelitis of the right fourth metatarsal. Prior left lower extremity below-knee amputation. History of peripheral vascular disease and diabetes. EXAM: NONINVASIVE PHYSIOLOGIC VASCULAR STUDY OF BILATERAL LOWER EXTREMITIES TECHNIQUE: Evaluation of both lower extremities were performed at rest, including calculation of ankle-brachial indices with single level Doppler, pressure and pulse volume recording. COMPARISON:  None Available. FINDINGS: Right ABI:  0.77 Left ABI:  N/A Right  Lower Extremity: Monophasic posterior tibial and dorsalis pedis waveforms. 0.5-0.79 Moderate PAD IMPRESSION: Evidence moderate arterial occlusive disease of the right lower extremity with resting ankle-brachial index of 0.77 and monophasic distal waveforms. Electronically Signed   By: Marcey Moan M.D.   On: 05/27/2024 12:25   MR FOOT RIGHT W WO CONTRAST Result Date: 05/27/2024 CLINICAL DATA:  Open wound along the ball of the foot.  Diabetes. EXAM: MRI OF THE RIGHT FOREFOOT WITHOUT AND WITH CONTRAST TECHNIQUE: Multiplanar, multisequence MR imaging of the right foot from the midfoot through the toes was performed before and after the administration of intravenous contrast. CONTRAST:  10mL GADAVIST  GADOBUTROL  1 MMOL/ML IV SOLN COMPARISON:  05/27/2024 radiographs FINDINGS: Bones/Joint/Cartilage Prior amputation of the fourth ray at the MTP joint. Prior amputation of the fifth ray at the mid metatarsal. Abnormal marrow edema and enhancement in the head of  the fourth metatarsal compatible with active osteomyelitis. Ligaments Lisfranc ligament intact. Muscles and Tendons Generalized regional muscular atrophy. Soft tissues Plantar draining sinus tract from the fourth metatarsal head to the cutaneous surface, image 27 series 10. Questionable additional early sinus tract formation extending dorsally and distally from the metatarsal head on images 15 through 17 of series 12. Prominent dorsal subcutaneous infiltrative edema in the forefoot. Cellulitis not excluded. This tracks into the remaining toes. IMPRESSION: 1. 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 2. Plantar draining sinus tract from the fourth metatarsal head to the cutaneous surface. Questionable additional early sinus tract formation extending dorsally and distally from the fourth metatarsal head. 3. Prominent dorsal subcutaneous infiltrative edema in the forefoot. Cellulitis not excluded. 4. Prior amputation of the fourth ray at the MTP joint. Prior amputation of the fifth ray at the mid metatarsal. Electronically Signed   By: Ryan Salvage M.D.   On: 05/27/2024 08:48   US  Venous Img Lower Unilateral Right Result Date: 05/27/2024 CLINICAL DATA:  Right lower extremity edema EXAM: Right LOWER EXTREMITY VENOUS DOPPLER ULTRASOUND TECHNIQUE: Gray-scale sonography with compression, as well as color and duplex ultrasound, were performed to evaluate the deep venous system(s) from the level of the common femoral vein through the popliteal and proximal calf veins. COMPARISON:  None Available. FINDINGS: VENOUS Normal compressibility of the common femoral, superficial femoral, and popliteal veins, as well as the visualized calf veins. Visualized portions of profunda femoral vein is unremarkable. The great saphenous vein was not visualized. No filling defects to suggest DVT on grayscale or color Doppler imaging. Doppler waveforms show normal direction of venous flow, normal respiratory plasticity  and response to augmentation. Limited views of the contralateral common femoral vein are unremarkable. OTHER 1.1 cm short axis node in the right groin is likely reactive. Limitations: none IMPRESSION: Negative for acute DVT. Electronically Signed   By: Norman Gatlin M.D.   On: 05/27/2024 02:20   DG Foot Complete Right Result Date: 05/27/2024 CLINICAL DATA:  Right foot pain, plantar wound EXAM: RIGHT FOOT COMPLETE - 3+ VIEW COMPARISON:  05/03/2024 FINDINGS: Amputation of the fourth toe at the MTP joint and the fifth toe at the proximal metatarsal. No acute fracture or dislocation. No evidence of osteomyelitis. Swelling about the forefoot. IMPRESSION: No evidence of osteomyelitis. Electronically Signed   By: Norman Gatlin M.D.   On: 05/27/2024 01:07        Scheduled Meds:  clopidogrel   75 mg Oral Q0600   enoxaparin  (LOVENOX ) injection  40 mg Subcutaneous Q24H   gabapentin   100 mg Oral TID   insulin  aspart  0-20 Units Subcutaneous TID WC   insulin  aspart  0-5 Units Subcutaneous QHS   ketorolac   15 mg Intravenous Q6H   losartan   25 mg Oral Daily   Continuous Infusions:   LOS: 1 day    Calvin KATHEE Robson, MD [facility]   If 7PM-7AM, please contact night-coverage  05/28/2024, 11:25 AM

## 2024-05-29 ENCOUNTER — Inpatient Hospital Stay: Payer: Self-pay | Admitting: Anesthesiology

## 2024-05-29 ENCOUNTER — Encounter: Payer: Self-pay | Admitting: Internal Medicine

## 2024-05-29 ENCOUNTER — Other Ambulatory Visit: Payer: Self-pay

## 2024-05-29 ENCOUNTER — Encounter
Admission: EM | Disposition: A | Discharge status: Skilled Nursing Facility | Payer: Self-pay | Source: Home / Self Care | Attending: Internal Medicine

## 2024-05-29 ENCOUNTER — Inpatient Hospital Stay

## 2024-05-29 DIAGNOSIS — M86171 Other acute osteomyelitis, right ankle and foot: Secondary | ICD-10-CM

## 2024-05-29 HISTORY — PX: IRRIGATION AND DEBRIDEMENT FOOT: SHX6602

## 2024-05-29 HISTORY — PX: METATARSAL HEAD EXCISION: SHX5027

## 2024-05-29 LAB — GLUCOSE, CAPILLARY
Glucose-Capillary: 162 mg/dL — ABNORMAL HIGH (ref 70–99)
Glucose-Capillary: 124 mg/dL — ABNORMAL HIGH (ref 70–99)
Glucose-Capillary: 215 mg/dL — ABNORMAL HIGH (ref 70–99)
Glucose-Capillary: 146 mg/dL — ABNORMAL HIGH (ref 70–99)

## 2024-05-29 SURGERY — IRRIGATION AND DEBRIDEMENT FOOT
Anesthesia: General | Site: Fourth Toe | Laterality: Right

## 2024-05-29 MED ORDER — PROPOFOL 500 MG/50ML IV EMUL
INTRAVENOUS | Status: DC | PRN
  Administered 2024-05-29: 50 ug/kg/min via INTRAVENOUS

## 2024-05-29 MED ORDER — DEXAMETHASONE SODIUM PHOSPHATE 10 MG/ML IJ SOLN
INTRAMUSCULAR | Status: AC
  Filled 2024-05-29: qty 1

## 2024-05-29 MED ORDER — OXYCODONE HCL 5 MG PO TABS
5.0000 mg | ORAL_TABLET | Freq: Once | ORAL | Status: AC
  Administered 2024-05-29: 5 mg via ORAL

## 2024-05-29 MED ORDER — MIDAZOLAM HCL 2 MG/2ML IJ SOLN
INTRAMUSCULAR | Status: DC | PRN
  Administered 2024-05-29 (×2): 1 mg via INTRAVENOUS

## 2024-05-29 MED ORDER — FENTANYL CITRATE (PF) 100 MCG/2ML IJ SOLN
INTRAMUSCULAR | Status: DC | PRN
  Administered 2024-05-29: 50 ug via INTRAVENOUS

## 2024-05-29 MED ORDER — DROPERIDOL 2.5 MG/ML IJ SOLN: 0.6250 mg | Freq: Once | INTRAMUSCULAR | Status: DC | PRN

## 2024-05-29 MED ORDER — ONDANSETRON HCL 4 MG/2ML IJ SOLN
INTRAMUSCULAR | Status: AC
  Filled 2024-05-29: qty 2

## 2024-05-29 MED ORDER — OXYCODONE HCL 5 MG PO TABS
ORAL_TABLET | ORAL | Status: AC
  Filled 2024-05-29: qty 1

## 2024-05-29 MED ORDER — SODIUM CHLORIDE 0.9 % IV SOLN: INTRAVENOUS | Status: DC | PRN

## 2024-05-29 MED ORDER — FENTANYL CITRATE (PF) 100 MCG/2ML IJ SOLN
INTRAMUSCULAR | Status: AC
  Filled 2024-05-29: qty 2

## 2024-05-29 MED ORDER — ONDANSETRON HCL 4 MG/2ML IJ SOLN
INTRAMUSCULAR | Status: DC | PRN
  Administered 2024-05-29: 4 mg via INTRAVENOUS

## 2024-05-29 MED ORDER — MIDAZOLAM HCL 2 MG/2ML IJ SOLN
INTRAMUSCULAR | Status: AC
  Filled 2024-05-29: qty 2

## 2024-05-29 MED ORDER — 0.9 % SODIUM CHLORIDE (POUR BTL) OPTIME
TOPICAL | Status: DC | PRN
  Administered 2024-05-29: 1000 mL

## 2024-05-29 MED ORDER — FENTANYL CITRATE (PF) 100 MCG/2ML IJ SOLN: 25.0000 ug | INTRAMUSCULAR | Status: DC | PRN

## 2024-05-29 MED ORDER — LIDOCAINE HCL 1 % IJ SOLN
INTRAMUSCULAR | Status: DC | PRN
  Administered 2024-05-29: 7 mL

## 2024-05-29 MED ORDER — CEFAZOLIN SODIUM-DEXTROSE 2-3 GM-%(50ML) IV SOLR
INTRAVENOUS | Status: DC | PRN
  Administered 2024-05-29: 2 g via INTRAVENOUS

## 2024-05-29 SURGICAL SUPPLY — 46 items
BLADE MED AGGRESSIVE (BLADE) IMPLANT
BNDG COHESIVE 4X5 TAN STRL LF (GAUZE/BANDAGES/DRESSINGS) ×2 IMPLANT
BNDG COHESIVE 6X5 TAN ST LF (GAUZE/BANDAGES/DRESSINGS) ×2 IMPLANT
BNDG ELASTIC 4INX 5YD STR LF (GAUZE/BANDAGES/DRESSINGS) ×2 IMPLANT
BNDG ESMARCH 4X12 STRL LF (GAUZE/BANDAGES/DRESSINGS) ×2 IMPLANT
BNDG GAUZE DERMACEA FLUFF 4 (GAUZE/BANDAGES/DRESSINGS) ×2 IMPLANT
BNDG STRETCH GAUZE 3IN X12FT (GAUZE/BANDAGES/DRESSINGS) ×2 IMPLANT
CUFF TOURN SGL QUICK 18X4 (TOURNIQUET CUFF) IMPLANT
CUFF TRNQT CYL 24X4X16.5-23 (TOURNIQUET CUFF) IMPLANT
DRSG EMULSION OIL 3X8 NADH (GAUZE/BANDAGES/DRESSINGS) ×2 IMPLANT
DURAPREP 26ML APPLICATOR (WOUND CARE) ×2 IMPLANT
ELECTRODE REM PT RTRN 9FT ADLT (ELECTROSURGICAL) ×2 IMPLANT
GAUZE PACKING 0.25INX5YD STRL (GAUZE/BANDAGES/DRESSINGS) IMPLANT
GAUZE SPONGE 4X4 12PLY STRL (GAUZE/BANDAGES/DRESSINGS) ×2 IMPLANT
GAUZE STRETCH 2X75IN STRL (MISCELLANEOUS) ×2 IMPLANT
GLOVE BIOGEL PI IND STRL 8 (GLOVE) ×2 IMPLANT
GLOVE SURG LX STRL 8.0 MICRO (GLOVE) ×2 IMPLANT
GOWN STRL REUS W/ TWL XL LVL3 (GOWN DISPOSABLE) ×2 IMPLANT
GOWN STRL REUS W/TWL MED LVL3 (GOWN DISPOSABLE) ×2 IMPLANT
HANDPIECE VERSAJET DEBRIDEMENT (MISCELLANEOUS) IMPLANT
IV NS 1000ML BAXH (IV SOLUTION) ×2 IMPLANT
KIT TURNOVER KIT A (KITS) ×2 IMPLANT
LABEL OR SOLS (LABEL) ×2 IMPLANT
MANIFOLD NEPTUNE II (INSTRUMENTS) ×2 IMPLANT
NDL FILTER BLUNT 18X1 1/2 (NEEDLE) ×2 IMPLANT
NDL HYPO 25X1 1.5 SAFETY (NEEDLE) ×2 IMPLANT
NEEDLE FILTER BLUNT 18X1 1/2 (NEEDLE) ×2 IMPLANT
NEEDLE HYPO 25X1 1.5 SAFETY (NEEDLE) ×2 IMPLANT
NS IRRIG 500ML POUR BTL (IV SOLUTION) ×2 IMPLANT
PACK EXTREMITY ARMC (MISCELLANEOUS) ×2 IMPLANT
PACKING GAUZE IODOFORM 1INX5YD (GAUZE/BANDAGES/DRESSINGS) IMPLANT
PAD ABD DERMACEA PRESS 5X9 (GAUZE/BANDAGES/DRESSINGS) ×2 IMPLANT
PENCIL SMOKE EVACUATOR (MISCELLANEOUS) ×2 IMPLANT
SOL .9 NS 3000ML IRR UROMATIC (IV SOLUTION) IMPLANT
SOLUTION PREP PVP 2OZ (MISCELLANEOUS) ×2 IMPLANT
STAPLER SKIN PROX 35W (STAPLE) IMPLANT
STOCKINETTE IMPERVIOUS 9X36 MD (GAUZE/BANDAGES/DRESSINGS) ×2 IMPLANT
SUT ETHILON 2 0 FS 18 (SUTURE) IMPLANT
SUT PROLENE 3 0 PS 2 (SUTURE) IMPLANT
SUT VIC AB 3-0 SH 27X BRD (SUTURE) IMPLANT
SUTURE ETHLN 4-0 FS2 18XMF BLK (SUTURE) IMPLANT
SWAB CULTURE AMIES ANAERIB BLU (MISCELLANEOUS) IMPLANT
SYR 10ML LL (SYRINGE) ×4 IMPLANT
TIP FAN IRRIG PULSAVAC PLUS (DISPOSABLE) IMPLANT
TRAP FLUID SMOKE EVACUATOR (MISCELLANEOUS) ×2 IMPLANT
WATER STERILE IRR 500ML POUR (IV SOLUTION) ×2 IMPLANT

## 2024-05-29 NOTE — Anesthesia Postprocedure Evaluation (Signed)
 Anesthesia Post Note  Patient: Austin [name]  Procedure(s) Performed: IRRIGATION AND DEBRIDEMENT FOOT (Right: Foot) EXCISION, METATARSAL BONE, HEAD (Right: Fourth Toe)  Patient location during evaluation: PACU Anesthesia Type: General Level of consciousness: awake and alert Pain management: pain level controlled Vital Signs Assessment: post-procedure vital signs reviewed and stable Respiratory status: spontaneous breathing, nonlabored ventilation, respiratory function stable and patient connected to nasal cannula oxygen Cardiovascular status: blood pressure returned to baseline and stable Postop Assessment: no apparent nausea or vomiting Anesthetic complications: no   No notable events documented.   Last Vitals:  Vitals:   05/29/24 1146 05/29/24 1200  BP: (!) 131/96 (!) 146/99  Pulse: 65 77  Resp: 12 16  Temp:  (!) 36.4 C  SpO2: 98% 94%    Last Pain:  Vitals:   05/29/24 1410  TempSrc:   PainSc: 5                  Prentice Murphy

## 2024-05-29 NOTE — Plan of Care (Signed)

## 2024-05-29 NOTE — Progress Notes (Signed)
 PROGRESS NOTE    Austin [name]  FMW:[id number] DOB: November 12, 1955 DOA: 05/27/2024 PCP: [place], [name] T, MD    Brief Narrative:  68 y.o. male with medical history significant of PVD status post right common femoral to TP trunk bypass with vein in 2023, status post left BKA, HTN, IDDM, diabetic neuropathy chronic HFpEF, PAF on Eliquis , presented with worsening of right foot pain.   Symptoms started about 4-5 days ago, patient started develop ulcer on the bottom of right foot, he was prescribed with 2 weeks of doxycycline  late July to early August, and went to see podiatry on August 7, a office debridement was done and patient was sent to wound care center and was instructed to use Betadine  to clean the wound every day and wear cam boot, which patient has followed.  Despite, patient continued to experience worsening of right foot pain.  He denied any fever chills.  He did not see significant discharge or pus coming out from the wound.   Assessment & Plan:   Principal Problem:   Osteomyelitis (HCC) Active Problems:   Acute osteomyelitis of right foot (HCC)  Acute right fourth metatarsal osteomyelitis Acute on chronic right foot diabetic ulcer infection - Failed outpatient management - Discussed with on-call podiatry, who recommended hold off antibiotics as patient does not have symptoms signs of sepsis.  Podiatry will see the patient and decide I&D plan. - Vascular surgery consulted - Status post angiogram 8/22 Plan: Podiatry consulted and patient now status post partial excision of fourth metatarsal on the right foot.  Tolerated procedure well.  Continue to hold antibiotics for today.  Can consider starting soon as tomorrow.   IDDM SSI.  Hold Lantus  for now.  CBGs ACHS.  Carb modified diet   HTN Poor control.  Continue current regimen for now   History of PVD - Continue Plavix    PAF - In sinus rhythm - Holding Eliquis  for incoming surgical intervention    DVT prophylaxis:  SCD Code Status: Full Family Communication: None Disposition Plan: Status is: Inpatient Remains inpatient appropriate because: Multiple acute issues as above   Level of care: Med-Surg  Consultants:  Podiatry Vascular surgery  Procedures:  Lower extremity angiogram 8/22 Partial excision right fourth metatarsal  Antimicrobials: None   Subjective: Seen and examined.  No pain endorsed currently.  Objective: Vitals:   05/29/24 1115 05/29/24 1133 05/29/24 1146 05/29/24 1200  BP: 117/80 (!) 143/76 (!) 131/96 (!) 146/99  Pulse: 66 73 65 77  Resp: 12 12 12 16   Temp:  98 F (36.7 C)  (!) 97.5 F (36.4 C)  TempSrc:    Oral  SpO2: 100% 98% 98% 94%  Weight:        Intake/Output Summary (Last 24 hours) at 05/29/2024 1303 Last data filed at 05/29/2024 1057 Gross per 24 hour  Intake 545 ml  Output 1078 ml  Net -533 ml   Filed Weights   05/26/24 2202  Weight: 108.9 kg    Examination:  General exam: NAD Respiratory system: Clear to auscultation. Respiratory effort normal. Cardiovascular system: S1-S2, RRR, no murmurs, no pedal edema Gastrointestinal system: Soft, NT/ND, normal bowel sounds. Central nervous system: Alert and oriented. No focal neurological deficits. Extremities: Left BKA.  Right foot in surgical dressing Skin: No rashes, lesions or ulcers Psychiatry: Judgement and insight appear normal. Mood & affect appropriate.     Data Reviewed: I have personally reviewed following labs and imaging studies  CBC: Recent Labs  Lab 05/26/24 2205 05/28/24 1014  WBC 9.6 8.5  NEUTROABS 6.0 5.1  HGB 14.0 13.4  HCT 42.9 39.8  MCV 84.0 81.1  PLT 222 192   Basic Metabolic Panel: Recent Labs  Lab 05/26/24 2205 05/28/24 1014  NA 141 138  K 4.1 4.1  CL 106 105  CO2 26 25  GLUCOSE 164* 122*  BUN 10 14  CREATININE 0.85 0.89  CALCIUM  9.3 8.9   GFR: Estimated Creatinine Clearance: 101.2 mL/min (by C-G formula based on SCr of 0.89 mg/dL). Liver Function  Tests: Recent Labs  Lab 05/26/24 2205  AST 18  ALT 15  ALKPHOS 72  BILITOT <0.2  PROT 7.2  ALBUMIN 3.4*   No results for input(s): LIPASE, AMYLASE in the last 168 hours. No results for input(s): AMMONIA in the last 168 hours. Coagulation Profile: Recent Labs  Lab 05/28/24 1014  INR 1.1   Cardiac Enzymes: No results for input(s): CKTOTAL, CKMB, CKMBINDEX, TROPONINI in the last 168 hours. BNP (last 3 results) No results for input(s): PROBNP in the last 8760 hours. HbA1C: Recent Labs    05/26/24 2206  HGBA1C 6.5*   CBG: Recent Labs  Lab 05/28/24 1407 05/28/24 1605 05/28/24 2031 05/29/24 0800 05/29/24 1058  GLUCAP 114* 118* 287* 162* 124*   Lipid Profile: No results for input(s): CHOL, HDL, LDLCALC, TRIG, CHOLHDL, LDLDIRECT in the last 72 hours. Thyroid Function Tests: No results for input(s): TSH, T4TOTAL, FREET4, T3FREE, THYROIDAB in the last 72 hours. Anemia Panel: No results for input(s): VITAMINB12, FOLATE, FERRITIN, TIBC, IRON, RETICCTPCT in the last 72 hours. Sepsis Labs: Recent Labs  Lab 05/27/24 9082 05/27/24 1057 05/28/24 1014  LATICACIDVEN 1.3 2.8* 1.4    Recent Results (from the past 240 hours)  Blood culture (routine x 2)     Status: None (Preliminary result)   Collection Time: 05/27/24 10:57 AM   Specimen: BLOOD  Result Value Ref Range Status   Specimen Description BLOOD RIGHT ANTECUBITAL  Final   Special Requests   Final    BOTTLES DRAWN AEROBIC ONLY Blood Culture results may not be optimal due to an inadequate volume of blood received in culture bottles   Culture   Final    NO GROWTH 2 DAYS Performed at [facility], [address] [place], [place] [address]    Report Status PENDING  Incomplete  Blood culture (routine x 2)     Status: None (Preliminary result)   Collection Time: 05/27/24 10:57 AM   Specimen: BLOOD  Result Value Ref Range Status   Specimen Description BLOOD  BLOOD RIGHT HAND  Final   Special Requests   Final    BOTTLES DRAWN AEROBIC ONLY Blood Culture results may not be optimal due to an inadequate volume of blood received in culture bottles   Culture   Final    NO GROWTH 2 DAYS Performed at [facility], [address] [place], [place] [address]    Report Status PENDING  Incomplete         Radiology Studies: PERIPHERAL VASCULAR CATHETERIZATION Result Date: 05/28/2024 See surgical note for result.       Scheduled Meds:  aspirin  EC  81 mg Oral Daily   atorvastatin   40 mg Oral Daily   clopidogrel   75 mg Oral Q0600   enoxaparin  (LOVENOX ) injection  40 mg Subcutaneous Q24H   gabapentin   100 mg Oral TID   insulin  aspart  0-20 Units Subcutaneous TID WC   insulin  aspart  0-5 Units Subcutaneous QHS   ketorolac   15  mg Intravenous Q6H   losartan   25 mg Oral Daily   Continuous Infusions:   LOS: 2 days    Calvin KATHEE Robson, MD [facility]   If 7PM-7AM, please contact night-coverage  05/29/2024, 1:03 PM

## 2024-05-29 NOTE — Plan of Care (Signed)
   Problem: Coping: Goal: Ability to adjust to condition or change in health will improve Outcome: Progressing   Problem: Fluid Volume: Goal: Ability to maintain a balanced intake and output will improve Outcome: Progressing   Problem: Metabolic: Goal: Ability to maintain appropriate glucose levels will improve Outcome: Progressing   Problem: Nutritional: Goal: Maintenance of adequate nutrition will improve Outcome: Progressing

## 2024-05-29 NOTE — Anesthesia Preprocedure Evaluation (Signed)
 Anesthesia Evaluation  Patient identified by MRN, date of birth, ID band Patient awake    Reviewed: Allergy & Precautions, NPO status , Patient's Chart, lab work & pertinent test results  History of Anesthesia Complications Negative for: history of anesthetic complications  Airway Mallampati: III  TM Distance: >3 FB Neck ROM: full    Dental  (+) Chipped, Poor Dentition, Missing, Dental Advidsory Given   Pulmonary shortness of breath and with exertion, neg pneumonia , neg COPD, neg recent URI, former smoker   Pulmonary exam normal        Cardiovascular Exercise Tolerance: Good hypertension, (-) angina + CAD, + Peripheral Vascular Disease and +CHF  (-) Past MI, (-) Cardiac Stents and (-) CABG Normal cardiovascular exam(-) dysrhythmias (-) Valvular Problems/Murmurs     Neuro/Psych negative neurological ROS  negative psych ROS   GI/Hepatic Neg liver ROS,GERD  Controlled,,  Endo/Other  negative endocrine ROSdiabetes, Type 2    Renal/GU negative Renal ROS  negative genitourinary   Musculoskeletal   Abdominal   Peds  Hematology negative hematology ROS (+)   Anesthesia Other Findings Past Medical History: No date: Coronary artery disease No date: Diabetes (HCC) No date: DVT (deep venous thrombosis) (HCC) 02/09/2018: Gangrene of left foot (HCC) No date: GERD (gastroesophageal reflux disease) No date: Hyperlipidemia No date: Hypertension No date: Peripheral vascular disease (HCC)  Past Surgical History: 02/20/2022: ABDOMINAL AORTOGRAM W/LOWER EXTREMITY; N/A     Comment:  Procedure: ABDOMINAL AORTOGRAM W/LOWER EXTREMITY;                Surgeon: Gretta Lonni PARAS, MD;  [facility];  Service: Cardiovascular;  Laterality: N/A; 10/28/2018: AMPUTATION; Left     Comment:  Procedure: AMPUTATION BELOW KNEE;  Surgeon: Marea Selinda RAMAN, MD;  Location: [facility];  Service: Vascular;                 Laterality: Left; 09/10/2021: AMPUTATION; Left     Comment:  Procedure: AMPUTATION DIGIT;  Surgeon: Elisabeth Craig RAMAN,               MD;  Location: [facility];  Service: Plastics;  Laterality:               Left; 09/14/2021: AMPUTATION; Left     Comment:  Procedure: AMPUTATION DIGIT left long finger and               irrigation and debridement;  Surgeon: Elisabeth Craig RAMAN,               MD;  Location: [facility];  Service: Plastics;  Laterality:               Left; 02/27/2022: AMPUTATION; Right     Comment:  Procedure: RIGHT PARTIAL AMPUTATION FOOT;  Surgeon:               Janit Thresa HERO, DPM;  [facility];  Service:               Podiatry;  Laterality: Right; 02/10/2018: AMPUTATION TOE; Left     Comment:  Procedure: AMPUTATION TOE;  Surgeon: Neill Boas, DPM;                Location: [facility];  Service: Podiatry;  Laterality:  Left; 10/16/2018: APPLICATION OF WOUND VAC; Left     Comment:  Procedure: APPLICATION OF WOUND VAC;  Surgeon: Ashley Soulier, DPM;  Location: [facility];  Service: Podiatry;                Laterality: Left; 01/12/2021: DORSAL SLIT; N/A     Comment:  Procedure: DORSAL SLIT;  Surgeon: Francisca Redell BROCKS, MD;               Location: [facility];  Service: Urology;  Laterality: N/A; 02/25/2022: FEMORAL-TIBIAL BYPASS GRAFT; Right     Comment:  Procedure: RIGHT LOWER EXTREMITY FEMORAL TO TIBIAL               PERONEAL TRUNK BYPASS;  Surgeon: Gretta Lonni PARAS,               MD;  [facility];  Service: Vascular;  Laterality:               Right;  INSERT ARTERIAL LINE No date: groin surgery 09/30/2018: IRRIGATION AND DEBRIDEMENT FOOT; Left     Comment:  Procedure: IRRIGATION AND DEBRIDEMENT FOOT;  Surgeon:               Neill Boas, DPM;  Location: [facility];  Service:               Podiatry;  Laterality: Left; 02/12/2018: LOWER EXTREMITY ANGIOGRAPHY; Left     Comment:  Procedure: Lower Extremity Angiography;  Surgeon: Marea Selinda RAMAN,  MD;  Location: [facility];  Service:               Cardiovascular;  Laterality: Left; 10/01/2018: LOWER EXTREMITY ANGIOGRAPHY; Left     Comment:  Procedure: Lower Extremity Angiography;  Surgeon: Marea Selinda RAMAN, MD;  Location: [facility];  Service:               Cardiovascular;  Laterality: Left; 10/19/2018: LOWER EXTREMITY ANGIOGRAPHY; Left     Comment:  Procedure: Lower Extremity Angiography;  Surgeon: Marea Selinda RAMAN, MD;  Location: [facility];  Service:               Cardiovascular;  Laterality: Left; 10/20/2018: LOWER EXTREMITY ANGIOGRAPHY; Left     Comment:  Procedure: LOWER EXTREMITY ANGIOGRAPHY;  Surgeon: Marea Selinda RAMAN, MD;  Location: [facility];  Service:               Cardiovascular;  Laterality: Left; 11/25/2018: LOWER EXTREMITY ANGIOGRAPHY; Right     Comment:  Procedure: LOWER EXTREMITY ANGIOGRAPHY;  Surgeon: Marea Selinda RAMAN, MD;  Location: [facility];  Service:               Cardiovascular;  Laterality: Right; 01/10/2020: LOWER EXTREMITY ANGIOGRAPHY; Right     Comment:  Procedure: LOWER EXTREMITY ANGIOGRAPHY;  Surgeon: Marea Selinda RAMAN, MD;  Location: [facility];  Service:  Cardiovascular;  Laterality: Right; 12/11/2020: LOWER EXTREMITY ANGIOGRAPHY; Right     Comment:  Procedure: LOWER EXTREMITY ANGIOGRAPHY;  Surgeon: Marea Selinda RAMAN, MD;  Location: [facility];  Service:               Cardiovascular;  Laterality: Right; 06/26/2023: LOWER EXTREMITY ANGIOGRAPHY; Right     Comment:  Procedure: Lower Extremity Angiography;  Surgeon:               Jama Cordella MATSU, MD;  Location: [facility];               Service: Cardiovascular;  Laterality: Right; 11/05/2023: LOWER EXTREMITY ANGIOGRAPHY; Right     Comment:  Procedure: Lower Extremity Angiography;  Surgeon: Marea Selinda RAMAN, MD;  Location: [facility];   Service:               Cardiovascular;  Laterality: Right; 02/12/2018: LOWER EXTREMITY INTERVENTION     Comment:  Procedure: LOWER EXTREMITY INTERVENTION;  Surgeon: Marea Selinda RAMAN, MD;  Location: [facility];  Service:               Cardiovascular;; 09/10/2021: MINOR IRRIGATION AND DEBRIDEMENT OF WOUND; Left     Comment:  Procedure: IRRIGATION AND DEBRIDEMENT OF LEFT LONG               FINGER WOUND;  Surgeon: Elisabeth Craig RAMAN, MD;  Location:               [facility];  Service: Plastics;  Laterality: Left; No date: NECK SURGERY 02/20/2022: PERIPHERAL VASCULAR INTERVENTION     Comment:  Procedure: PERIPHERAL VASCULAR INTERVENTION;  Surgeon:               Gretta Lonni PARAS, MD;  [facility];               Service: Cardiovascular;;  right iliac 10/16/2018: TRANSMETATARSAL AMPUTATION; Left     Comment:  Procedure: TRANSMETATARSAL AMPUTATION LEFT FOOT;                Surgeon: Ashley Soulier, DPM;  Location: [facility];                Service: Podiatry;  Laterality: Left; 02/25/2022: VEIN HARVEST; Right     Comment:  Procedure: VEIN HARVEST OF RIGHT GREATER SAPHENOUS VEIN;              Surgeon: Gretta Lonni PARAS, MD;  [facility];                Service: Vascular;  Laterality: Right;  BMI    Body Mass Index: 28.89 kg/m      Reproductive/Obstetrics negative OB ROS                              Anesthesia Physical Anesthesia Plan  ASA: 3  Anesthesia Plan: General   Post-op Pain Management:    Induction: Intravenous  PONV Risk Score and Plan: 2 and Propofol  infusion and TIVA  Airway Management Planned: Natural Airway and Simple Face Mask  Additional Equipment:   Intra-op Plan:   Post-operative Plan:   Informed Consent: I have  reviewed the patients History and Physical, chart, labs and discussed the procedure including the risks, benefits and alternatives for the proposed anesthesia with the patient or authorized  representative who has indicated his/her understanding and acceptance.     Dental Advisory Given  Plan Discussed with: Anesthesiologist, CRNA and Surgeon  Anesthesia Plan Comments: (Patient consented for risks of anesthesia including but not limited to:  - adverse reactions to medications - risk of airway placement if required - damage to eyes, teeth, lips or other oral mucosa - nerve damage due to positioning  - sore throat or hoarseness - Damage to heart, brain, nerves, lungs, other parts of body or loss of life  Patient voiced understanding and assent.)        Anesthesia Quick Evaluation

## 2024-05-29 NOTE — Brief Op Note (Signed)
 05/29/2024  10:57 AM  PATIENT:  Austin [name]  68 y.o. male  PRE-OPERATIVE DIAGNOSIS:  osteomyelitis right foot  POST-OPERATIVE DIAGNOSIS:  osteomyelitis fight foot  PROCEDURE:  Procedure(s): IRRIGATION AND DEBRIDEMENT FOOT (Right) EXCISION, METATARSAL BONE, HEAD (Right)  SURGEON:  Surgeons and Role:    DEWAINE Janit Thresa CHRISTELLA, DPM - Primary  PHYSICIAN ASSISTANT: None  ASSISTANTS: none   ANESTHESIA:   IV sedation  EBL:  3 mL   BLOOD ADMINISTERED:none  DRAINS: none   LOCAL MEDICATIONS USED:  LIDOCAINE   and Amount: 20 ml  SPECIMEN:  Source of Specimen:  fourth metatarsal head sent for BOTH path and culture. Soft tissue culture.   DISPOSITION OF SPECIMEN:  PATHOLOGY  COUNTS:  YES  TOURNIQUET:   Total Tourniquet Time Documented: Calf (Right) - 19 minutes Total: Calf (Right) - 19 minutes   DICTATION: .[place] Dictation  PLAN OF CARE: Admit to inpatient   PATIENT DISPOSITION:  PACU - hemodynamically stable.   Delay start of Pharmacological VTE agent (>24hrs) due to surgical blood loss or risk of bleeding: no  Thresa EMERSON Janit, DPM [facility]  Dr. Thresa EMERSON Janit, DPM    [address] [place], [place] [address]                Office [phone]  Fax [phone]

## 2024-05-29 NOTE — Transfer of Care (Signed)
 Immediate Anesthesia Transfer of Care Note  Patient: Austin [name]  Procedure(s) Performed: IRRIGATION AND DEBRIDEMENT FOOT (Right: Foot) EXCISION, METATARSAL BONE, HEAD (Right: Fourth Toe)  Patient Location: PACU  Anesthesia Type:General  Level of Consciousness: drowsy  Airway & Oxygen Therapy: Patient Spontanous Breathing and Patient connected to face mask oxygen  Post-op Assessment: Report given to RN and Post -op Vital signs reviewed and stable  Post vital signs: Reviewed and stable  Last Vitals:  Vitals Value Taken Time  BP 126/88 05/29/24 10:55  Temp    Pulse 73 05/29/24 10:56  Resp 12 05/29/24 10:56  SpO2 100 % 05/29/24 10:56  Vitals shown include unfiled device data.  Last Pain:  Vitals:   05/29/24 0845  TempSrc: Oral  PainSc: 0-No pain         Complications: No notable events documented.

## 2024-05-29 NOTE — Op Note (Signed)
   OPERATIVE REPORT Patient name: Austin [name] MRN: [id number] DOB: August 11, 1956  DOS: 05/29/24  Preop Dx: OM fourth metatarsal right foot Postop Dx: same  Procedure:  1. Partial excision of fourth metatarsal right foot  Surgeon: Thresa EMERSON Sar DPM  Anesthesia: IV sedation  Hemostasis: Calf tourniquet inflated to a pressure of without esmarch exsanguination   EBL: minimal mL Materials: none Injectables: 7 mL 1% lidocaine  plain Pathology: Fourth metatarsal sent in 2 separate specimen containers for BOTH pathology and culture.  Soft tissue culture  Condition: The patient tolerated the procedure and anesthesia well. No complications noted or reported   Justification for procedure: The patient is a 68 y.o. male who presents today for surgical correction of osteomyelitis to the fourth metatarsal right foot. All conservative modalities of been unsuccessful in providing any sort of satisfactory alleviation of symptoms with the patient. The patient was told benefits as well as possible side effects of the surgery. The patient consented for surgical correction. The patient consent form was reviewed. All patient questions were answered. No guarantees were expressed or implied. The patient and the surgeon both signed the patient consent form with the witness present and placed in the patient's chart.   Procedure in Detail: The patient was brought to the operating room, placed in the operating table in the supine position at which time an aseptic scrub and drape were performed about the patient's respective lower extremity after anesthesia was induced as described above. Attention was then directed to the surgical area where procedure number one commenced.  Procedure #1: Excision fourth metatarsal head right foot A 4cm linear longitudinal incision was planned and made overlying the distal fourth metatarsal of the right foot. Incision carried down to the level of bone and soft tissue  around the distal portion of the fourth metatarsal was reflected away in preparation for the ensuing osteotomy. Osteotomy created at the proximal portion of the metatarsal neck and the distal portion of metatarsal was removed in toto.  The most proximal portion of the fourth metatarsal was harvested and sent for pathology while the remaining portion of the metatarsal was sent for culture.  Soft tissue culture was also taken within the surgical site.  Copious irrigation was then utilized in preparation for primary closure.  Primary closure achieved using 4-0 Vicryl and skin staples.  Dry sterile compressive dressings were then applied to all previously mentioned incision sites about the patient's lower extremity. The tourniquet which was used for hemostasis was deflated. All normal neurovascular responses including pink color and warmth returned all the digits of patient's lower extremity.  The patient was then transferred from the operating room to the recovery room having tolerated the procedure and anesthesia well. All vital signs are stable. After a brief stay in the recovery room the patient was readmitted to inpatient room with postoperative orders placed.     Thresa EMERSON Sar, DPM [facility]  Dr. Thresa EMERSON Sar, DPM    [address] [place], [place] [address]                Office [phone]  Fax [phone]

## 2024-05-29 NOTE — Anesthesia Procedure Notes (Signed)
 Procedure Name: MAC Date/Time: 05/29/2024 10:05 AM  Performed by: Delores Evalene BROCKS, CRNAPre-anesthesia Checklist: Patient identified, Emergency Drugs available, Suction available and Patient being monitored Patient Re-evaluated:Patient Re-evaluated prior to induction Oxygen Delivery Method: Simple face mask Induction Type: IV induction Placement Confirmation: positive ETCO2

## 2024-05-30 ENCOUNTER — Encounter: Payer: Self-pay | Admitting: Podiatry

## 2024-05-30 DIAGNOSIS — M86171 Other acute osteomyelitis, right ankle and foot: Secondary | ICD-10-CM | POA: Diagnosis not present

## 2024-05-30 LAB — CBC WITH DIFFERENTIAL/PLATELET
Abs Immature Granulocytes: 0.02 K/uL (ref 0.00–0.07)
Basophils Absolute: 0 K/uL (ref 0.0–0.1)
Basophils Relative: 0 %
Eosinophils Absolute: 0.1 K/uL (ref 0.0–0.5)
Eosinophils Relative: 2 %
HCT: 39.3 % (ref 39.0–52.0)
Hemoglobin: 13.2 g/dL (ref 13.0–17.0)
Immature Granulocytes: 0 %
Lymphocytes Relative: 24 %
Lymphs Abs: 1.9 K/uL (ref 0.7–4.0)
MCH: 27.4 pg (ref 26.0–34.0)
MCHC: 33.6 g/dL (ref 30.0–36.0)
MCV: 81.5 fL (ref 80.0–100.0)
Monocytes Absolute: 0.6 K/uL (ref 0.1–1.0)
Monocytes Relative: 8 %
Neutro Abs: 5.2 K/uL (ref 1.7–7.7)
Neutrophils Relative %: 66 %
Platelets: 186 K/uL (ref 150–400)
RBC: 4.82 MIL/uL (ref 4.22–5.81)
RDW: 14.9 % (ref 11.5–15.5)
WBC: 7.9 K/uL (ref 4.0–10.5)
nRBC: 0 % (ref 0.0–0.2)

## 2024-05-30 LAB — GLUCOSE, CAPILLARY
Glucose-Capillary: 220 mg/dL — ABNORMAL HIGH (ref 70–99)
Glucose-Capillary: 163 mg/dL — ABNORMAL HIGH (ref 70–99)
Glucose-Capillary: 116 mg/dL — ABNORMAL HIGH (ref 70–99)
Glucose-Capillary: 156 mg/dL — ABNORMAL HIGH (ref 70–99)

## 2024-05-30 LAB — BASIC METABOLIC PANEL WITH GFR
Anion gap: 13 (ref 5–15)
BUN: 14 mg/dL (ref 8–23)
CO2: 23 mmol/L (ref 22–32)
Calcium: 9 mg/dL (ref 8.9–10.3)
Chloride: 103 mmol/L (ref 98–111)
Creatinine, Ser: 0.87 mg/dL (ref 0.61–1.24)
GFR, Estimated: >60 mL/min (ref >60)
Glucose, Bld: 136 mg/dL — ABNORMAL HIGH (ref 70–99)
Potassium: 4.3 mmol/L (ref 3.5–5.1)
Sodium: 139 mmol/L (ref 135–145)

## 2024-05-30 LAB — PROCALCITONIN: Procalcitonin: <0.1 ng/mL

## 2024-05-30 MED ORDER — ORAL CARE MOUTH RINSE: 15.0000 mL | OROMUCOSAL | Status: DC | PRN

## 2024-05-30 MED ORDER — APIXABAN 5 MG PO TABS
5.0000 mg | ORAL_TABLET | Freq: Two times a day (BID) | ORAL | Status: DC
  Administered 2024-05-30 – 2024-06-03 (×9): 5 mg via ORAL
  Filled 2024-05-30 (×9): qty 1

## 2024-05-30 MED ORDER — AMOXICILLIN-POT CLAVULANATE 875-125 MG PO TABS
1.0000 | ORAL_TABLET | Freq: Two times a day (BID) | ORAL | Status: DC
  Administered 2024-05-30 – 2024-06-03 (×9): 1 via ORAL
  Filled 2024-05-30 (×9): qty 1

## 2024-05-30 MED ORDER — DOXYCYCLINE HYCLATE 100 MG PO TABS
100.0000 mg | ORAL_TABLET | Freq: Two times a day (BID) | ORAL | Status: DC
  Administered 2024-05-30 – 2024-06-03 (×9): 100 mg via ORAL
  Filled 2024-05-30 (×9): qty 1

## 2024-05-30 MED ORDER — GABAPENTIN 300 MG PO CAPS
300.0000 mg | ORAL_CAPSULE | Freq: Three times a day (TID) | ORAL | Status: DC
  Administered 2024-05-30 – 2024-06-03 (×12): 300 mg via ORAL
  Filled 2024-05-30 (×12): qty 1

## 2024-05-30 MED ORDER — DICLOFENAC SODIUM 1 % EX GEL
4.0000 g | Freq: Four times a day (QID) | CUTANEOUS | Status: DC
  Administered 2024-05-30 – 2024-06-03 (×13): 4 g via TOPICAL
  Filled 2024-05-30 (×2): qty 100

## 2024-05-30 NOTE — Progress Notes (Signed)
 PROGRESS NOTE    Austin [name]  FMW:[id number] DOB: 1955/12/14 DOA: 05/27/2024 PCP: [place], [name] T, MD    Brief Narrative:  68 y.o. male with medical history significant of PVD status post right common femoral to TP trunk bypass with vein in 2023, status post left BKA, HTN, IDDM, diabetic neuropathy chronic HFpEF, PAF on Eliquis , presented with worsening of right foot pain.   Symptoms started about 4-5 days ago, patient started develop ulcer on the bottom of right foot, he was prescribed with 2 weeks of doxycycline  late July to early August, and went to see podiatry on August 7, a office debridement was done and patient was sent to wound care center and was instructed to use Betadine  to clean the wound every day and wear cam boot, which patient has followed.  Despite, patient continued to experience worsening of right foot pain.  He denied any fever chills.  He did not see significant discharge or pus coming out from the wound.   Assessment & Plan:   Principal Problem:   Osteomyelitis (HCC) Active Problems:   Acute osteomyelitis of right foot (HCC)  Acute right fourth metatarsal osteomyelitis Acute on chronic right foot diabetic ulcer infection - Failed outpatient management - Discussed with on-call podiatry, who recommended hold off antibiotics as patient does not have symptoms signs of sepsis.  Podiatry will see the patient and decide I&D plan. - Vascular surgery consulted - Status post angiogram 8/22 Plan: Podiatry consulted and patient now status post partial excision of fourth metatarsal on the right foot.  Tolerated procedure well.  Will start p.o. antibiotics.  Plan for 5-day course.  Start doxycycline  and Augmentin .  If pain control improved anticipate discharge 8/25.   IDDM SSI.  Hold Lantus  for now.  CBGs ACHS.  Carb modified diet   HTN Poor control.  Continue current regimen for now   History of PVD - Continue Plavix    PAF - In sinus rhythm - Resume  Eliquis     DVT prophylaxis: Eliquis  Code Status: Full Family Communication: None Disposition Plan: Status is: Inpatient Remains inpatient appropriate because: Multiple acute issues as above   Level of care: Med-Surg  Consultants:  Podiatry Vascular surgery  Procedures:  Lower extremity angiogram 8/22 Partial excision right fourth metatarsal  Antimicrobials: None   Subjective: Examined peer resting in bed.  No distress.  No complaints  Objective: Vitals:   05/29/24 1517 05/29/24 2029 05/30/24 0417 05/30/24 0819  BP: 130/66 (!) 140/68 (!) 158/68 (!) 154/75  Pulse: 83 61 68 94  Resp: 17 18 15 16   Temp: (!) 97.5 F (36.4 C) 97.6 F (36.4 C) 97.7 F (36.5 C) (!) 97.3 F (36.3 C)  TempSrc: Oral  Oral   SpO2: 97% 99% 100% 96%  Weight:        Intake/Output Summary (Last 24 hours) at 05/30/2024 1226 Last data filed at 05/30/2024 0417 Gross per 24 hour  Intake 700 ml  Output 1450 ml  Net -750 ml   Filed Weights   05/26/24 2202  Weight: 108.9 kg    Examination:  General exam: No acute distress Respiratory system: Clear to auscultation. Respiratory effort normal. Cardiovascular system: S1-S2, RRR, no murmurs, no pedal edema Gastrointestinal system: Soft, NT/ND, normal bowel sounds. Central nervous system: Alert and oriented. No focal neurological deficits. Extremities: Left BKA.  Right foot in surgical dressing Skin: No rashes, lesions or ulcers Psychiatry: Judgement and insight appear normal. Mood & affect appropriate.     Data Reviewed: I have  personally reviewed following labs and imaging studies  CBC: Recent Labs  Lab 05/26/24 2205 05/28/24 1014 05/30/24 0838  WBC 9.6 8.5 7.9  NEUTROABS 6.0 5.1 5.2  HGB 14.0 13.4 13.2  HCT 42.9 39.8 39.3  MCV 84.0 81.1 81.5  PLT 222 192 186   Basic Metabolic Panel: Recent Labs  Lab 05/26/24 2205 05/28/24 1014 05/30/24 0838  NA 141 138 139  K 4.1 4.1 4.3  CL 106 105 103  CO2 26 25 23   GLUCOSE 164*  122* 136*  BUN 10 14 14   CREATININE 0.85 0.89 0.87  CALCIUM  9.3 8.9 9.0   GFR: Estimated Creatinine Clearance: 103.6 mL/min (by C-G formula based on SCr of 0.87 mg/dL). Liver Function Tests: Recent Labs  Lab 05/26/24 2205  AST 18  ALT 15  ALKPHOS 72  BILITOT <0.2  PROT 7.2  ALBUMIN 3.4*   No results for input(s): LIPASE, AMYLASE in the last 168 hours. No results for input(s): AMMONIA in the last 168 hours. Coagulation Profile: Recent Labs  Lab 05/28/24 1014  INR 1.1   Cardiac Enzymes: No results for input(s): CKTOTAL, CKMB, CKMBINDEX, TROPONINI in the last 168 hours. BNP (last 3 results) No results for input(s): PROBNP in the last 8760 hours. HbA1C: No results for input(s): HGBA1C in the last 72 hours.  CBG: Recent Labs  Lab 05/29/24 1058 05/29/24 1637 05/29/24 2117 05/30/24 0830 05/30/24 1136  GLUCAP 124* 215* 146* 220* 163*   Lipid Profile: No results for input(s): CHOL, HDL, LDLCALC, TRIG, CHOLHDL, LDLDIRECT in the last 72 hours. Thyroid Function Tests: No results for input(s): TSH, T4TOTAL, FREET4, T3FREE, THYROIDAB in the last 72 hours. Anemia Panel: No results for input(s): VITAMINB12, FOLATE, FERRITIN, TIBC, IRON, RETICCTPCT in the last 72 hours. Sepsis Labs: Recent Labs  Lab 05/27/24 9082 05/27/24 1057 05/28/24 1014 05/30/24 0838  PROCALCITON  --   --   --  <0.10  LATICACIDVEN 1.3 2.8* 1.4  --     Recent Results (from the past 240 hours)  Blood culture (routine x 2)     Status: None (Preliminary result)   Collection Time: 05/27/24 10:57 AM   Specimen: BLOOD  Result Value Ref Range Status   Specimen Description BLOOD RIGHT ANTECUBITAL  Final   Special Requests   Final    BOTTLES DRAWN AEROBIC ONLY Blood Culture results may not be optimal due to an inadequate volume of blood received in culture bottles   Culture   Final    NO GROWTH 3 DAYS Performed at [facility], [address] [place], [place] [address]    Report Status PENDING  Incomplete  Blood culture (routine x 2)     Status: None (Preliminary result)   Collection Time: 05/27/24 10:57 AM   Specimen: BLOOD  Result Value Ref Range Status   Specimen Description BLOOD BLOOD RIGHT HAND  Final   Special Requests   Final    BOTTLES DRAWN AEROBIC ONLY Blood Culture results may not be optimal due to an inadequate volume of blood received in culture bottles   Culture   Final    NO GROWTH 3 DAYS Performed at [facility], [address] [place], [place] [address]    Report Status PENDING  Incomplete  Aerobic/Anaerobic Culture w Gram Stain (surgical/deep wound)     Status: None (Preliminary result)   Collection Time: 05/29/24 10:36 AM   Specimen: Wound; Tissue  Result Value Ref Range Status   Specimen Description   Final  WOUND Performed at [facility], [address] [place], [place] [address]    Special Requests   Final    NONE Performed at [facility], [address] [place], [place] [address]    Gram Stain   Final    RARE WBC SEEN NO EPITHELIAL CELLS SEEN  NO ORGANISMS SEEN    Culture   Final    CULTURE REINCUBATED FOR BETTER GROWTH Performed at [facility], [address] [place], [place] [address]    Report Status PENDING  Incomplete  Aerobic/Anaerobic Culture w Gram Stain (surgical/deep wound)     Status: None (Preliminary result)   Collection Time: 05/29/24 10:36 AM   Specimen: Wound; Tissue  Result Value Ref Range Status   Specimen Description   Final    WOUND Performed at [facility], [address] [place], [place] [address]    Special Requests   Final    NONE Performed at [facility], [address] [place], [place] [address]    Gram Stain   Final    RARE WBC SEEN RARE SQUAMOUS EPITHELIAL CELLS PRESENT NO ORGANISMS SEEN    Culture   Final    CULTURE REINCUBATED FOR BETTER GROWTH Performed at [facility], [address], [place], [place] [address]    Report Status PENDING  Incomplete         Radiology Studies: DG Foot Complete Right Result Date: 05/29/2024 EXAM: 3 or more VIEW(S) XRAY OF THE RIGHT FOOT 05/29/2024 11:48:00 AM COMPARISON: Preoperative imaging. CLINICAL HISTORY: [id number] Postop check [id number]. Post op FINDINGS: Interval transmetatarsal amputation of the fourth ray. Previous fifth transmetatarsal amputation. Expected postoperative changes in the overlying soft tissues with skin staples in place. IMPRESSION: 1. Interval transmetatarsal of the fourth ray. 2. Previous fifth metatarsal amputation. Electronically signed by: Andrea Gasman MD 05/29/2024 02:23 PM EDT RP Workstation: [id number]   PERIPHERAL VASCULAR CATHETERIZATION Result Date: 05/28/2024 See surgical note for result.       Scheduled Meds:  aspirin  EC  81 mg Oral Daily   atorvastatin   40 mg Oral Daily   clopidogrel   75 mg Oral Q0600   diclofenac  Sodium  4 g Topical QID   enoxaparin  (LOVENOX ) injection  40 mg Subcutaneous Q24H   gabapentin   300 mg Oral TID   insulin  aspart  0-20 Units Subcutaneous TID WC   insulin  aspart  0-5 Units Subcutaneous QHS   losartan   25 mg Oral Daily   Continuous Infusions:   LOS: 3 days    Calvin KATHEE Robson, MD [facility]   If 7PM-7AM, please contact night-coverage  05/30/2024, 12:26 PM

## 2024-05-30 NOTE — Evaluation (Signed)
 Physical Therapy Evaluation Patient Details Name: Austin [name] MRN: [id number] DOB: 07/10/1956 Today's Date: 05/30/2024  History of Present Illness  68 y.o. male with medical history significant of PVD status post right common femoral to TP trunk bypass with vein in 2023, status post left BKA, HTN, IDDM, diabetic neuropathy chronic HFpEF, PAF on Eliquis , presented with worsening of right foot pain. Underwent partial excision of 4th metatarsal of R foot on 05/29/24 due to osteomyelitis.   Clinical Impression  Patient received reclining in bed and agreeable to PT but taking multiple personal phone calls during session.  Pateint reports that he is mod I with mobility using RW and w/c (manual and power) with L LE prosthesis at baseline with no falls in the last 6 months. He lives in a single story trailer with a ramped entrance and has help from [facility] nursing, aide, and therapy each coming 2x a week. He is mod I with ADLs except aide supervises bathing for safety. His sister drives him and gets groceries. Upon PT evaluation, patient was independent with bed mobility, needed help with donning L LE prosthesis and right post op boot (several attempts made, limited due to pain and difficulty with technique of getting pin centered correctly with prosthesis). He was unable to transfer or stand up with RW or with max A from PT with blocking at R knee due to weakness and 10/10 pain at the right low back and right posterior lateral glute and thigh. RN brought pain medication during session. Unclear if weakness is also related to pain inhibition. Patient currently does not have sufficient transfer ability to recommend home at this time but has potential for rapid improvement. He has experienced a significant decline in functional mobility and independence. Patient would benefit from skilled physical therapy to address impairments and functional limitations (see PT Problem List below) to work towards stated goals and  return to PLOF or maximal functional independence.        If plan is discharge home, recommend the following: Two people to help with walking and/or transfers;Help with stairs or ramp for entrance;Two people to help with bathing/dressing/bathroom;Assistance with cooking/housework;Assist for transportation   Can travel by private vehicle   No    Equipment Recommendations BSC/3in1  Recommendations for Other Services       Functional Status Assessment Patient has had a recent decline in their functional status and demonstrates the ability to make significant improvements in function in a reasonable and predictable amount of time.     Precautions / Restrictions Precautions Precautions: Fall Recall of Precautions/Restrictions: Intact Required Braces or Orthoses: Other Brace Other Brace: right post op shoe. Also uses Left prosthesis at baseline. Restrictions Weight Bearing Restrictions Per Provider Order: Yes RLE Weight Bearing Per Provider Order:  (R LE wegith bearing with post op shoe only for transfers and bathroom priveleges)      Mobility  Bed Mobility Overal bed mobility: Modified Independent                  Transfers Overall transfer level: Needs assistance Equipment used: Rollator (4 wheels) Transfers: Sit to/from Stand Sit to Stand: Max assist, Mod assist           General transfer comment: attempted sit <> stand from elevated bed with RW and mod A but pateint unable and not getting head forwards enough. Attempted without AD with max A +1 blocking at R knee and unable to come to full stand, lacking B hip and knee extension. Limited  by pain and weakness (possibly mostly pain inhibition).  no chair available to attempt pivot transfer to.    Ambulation/Gait               General Gait Details: unable to ambluate at this time  Stairs            Wheelchair Mobility     Tilt Bed    Modified Rankin (Stroke Patients Only)       Balance  Overall balance assessment: Needs assistance Sitting-balance support: Feet unsupported, No upper extremity supported Sitting balance-Leahy Scale: Normal       Standing balance-Leahy Scale: Zero Standing balance comment: unable to stand                             Pertinent Vitals/Pain Pain Assessment Pain Assessment: 0-10 Pain Score: 10-Worst pain ever Pain Location: right low back and posteriolateral thigh Pain Descriptors / Indicators: Discomfort, Grimacing, Guarding, Moaning, Restless Pain Intervention(s): Limited activity within patient's tolerance, Monitored during session, Patient requesting pain meds-RN notified, RN gave pain meds during session, Repositioned    Home Living Family/patient expects to be discharged to:: Private residence Living Arrangements: Alone Available Help at Discharge: Family;Available PRN/intermittently;Available 24 hours/day;Home health;Personal care attendant (baseline intermittantly but states his sister could stay with him if needed) Type of Home: Mobile home Home Access: Ramped entrance       Home Layout: One level Home Equipment: Pharmacist, hospital (2 wheels);Wheelchair - manual;Hospital bed;Wheelchair - power Additional Comments: Has a relatively new L LE prosthesis.    Prior Function Prior Level of Function : Needs assist             Mobility Comments: Patient he ambulates mod I at baseline with RW or uses wheelchairs for mobility. Denies falls in the last 6 months. ADLs Comments: Patient reports he is mod I for ADLs except his aide supervises bathing for safety. Aide helps with IADLs such as houscleaning and sister drives him and gets groceries for him.     Extremity/Trunk Assessment   Upper Extremity Assessment Upper Extremity Assessment: Generalized weakness (WFL except supporting full weight on RW since his legs were not supporting him well)    Lower Extremity Assessment Lower Extremity Assessment: RLE  deficits/detail;LLE deficits/detail RLE Deficits / Details: 10/10 pain in right glute thigh and low back. Unable to straighten hip and knee when attempting sit <> stand even with max A. Pain limiting and unclear if it is weak as well or just pain inhibition. RLE: Unable to fully assess due to pain LLE Deficits / Details: transtibial amputation, unable to fully extend hihp and knee when attempting to stand and needed bed elevated so hips above knees to get into partial standing with clinician [email or web address].e LLE: Unable to fully assess due to pain    Cervical / Trunk Assessment Cervical / Trunk Assessment: Normal  Communication   Communication Communication: No apparent difficulties    Cognition Arousal: Alert Behavior During Therapy: WFL for tasks assessed/performed   PT - Cognitive impairments: No apparent impairments                         Following commands: Intact       Cueing Cueing Techniques: Verbal cues, Gestural cues, Visual cues     General Comments      Exercises Other Exercises Other Exercises: pt educated on role of PT in acute care setting, discharge reccomendations,  mobility techniques Other Exercises: assisted pt with donning/doffing of left prosthesis (after he made several failed attempts) and with R post op shoe (he made multiple attempts but was limited mostly by pain)   Assessment/Plan    PT Assessment Patient needs continued PT services  PT Problem List Decreased strength;Decreased mobility;Decreased range of motion;Decreased activity tolerance;Decreased balance;Pain       PT Treatment Interventions DME instruction;Therapeutic exercise;Gait training;Balance training;Neuromuscular re-education;Functional mobility training;Patient/family education;Therapeutic activities    PT Goals (Current goals can be found in the Care Plan section)  Acute Rehab PT Goals Patient Stated Goal: to go home and be in less pain PT Goal Formulation: With  patient Time For Goal Achievement: 06/13/24 Potential to Achieve Goals: Good    Frequency Min 3X/week     Co-evaluation               AM-PAC PT 6 Clicks Mobility  Outcome Measure Help needed turning from your back to your side while in a flat bed without using bedrails?: None Help needed moving from lying on your back to sitting on the side of a flat bed without using bedrails?: None Help needed moving to and from a bed to a chair (including a wheelchair)?: Total Help needed standing up from a chair using your arms (e.g., wheelchair or bedside chair)?: Total Help needed to walk in hospital room?: Total Help needed climbing 3-5 steps with a railing? : Total 6 Click Score: 12    End of Session Equipment Utilized During Treatment: Gait belt Activity Tolerance: Patient limited by pain Patient left: in bed;with call bell/phone within reach;with bed alarm set Nurse Communication: Mobility status;Other (comment) (back and leg pain) PT Visit Diagnosis: Other abnormalities of gait and mobility (R26.89);Muscle weakness (generalized) (M62.81);Pain Pain - Right/Left: Right Pain - part of body: Leg;Hip (low back)    Time: 8861-8788 PT Time Calculation (min) (ACUTE ONLY): 33 min   Charges:   PT Evaluation $PT Eval Moderate Complexity: 1 Mod PT Treatments $Therapeutic Activity: 8-22 mins PT General Charges $$ ACUTE PT VISIT: 1 Visit         Camie R. Juli, PT, DPT, Cert. MDT 05/30/24, 12:39 PM  [facility] [address] [place], [place] [address] P: [phone] I F: [phone]

## 2024-05-31 ENCOUNTER — Encounter: Payer: Self-pay | Admitting: Vascular Surgery

## 2024-05-31 ENCOUNTER — Ambulatory Visit: Admitting: Internal Medicine

## 2024-05-31 DIAGNOSIS — M86171 Other acute osteomyelitis, right ankle and foot: Secondary | ICD-10-CM | POA: Diagnosis not present

## 2024-05-31 LAB — GLUCOSE, CAPILLARY
Glucose-Capillary: 158 mg/dL — ABNORMAL HIGH (ref 70–99)
Glucose-Capillary: 161 mg/dL — ABNORMAL HIGH (ref 70–99)
Glucose-Capillary: 153 mg/dL — ABNORMAL HIGH (ref 70–99)
Glucose-Capillary: 158 mg/dL — ABNORMAL HIGH (ref 70–99)

## 2024-05-31 NOTE — Progress Notes (Signed)
 PROGRESS NOTE    Austin [name]  FMW:[id number] DOB: 05/10/1956 DOA: 05/27/2024 PCP: [place], [name] T, MD    Brief Narrative:  68 y.o. male with medical history significant of PVD status post right common femoral to TP trunk bypass with vein in 2023, status post left BKA, HTN, IDDM, diabetic neuropathy chronic HFpEF, PAF on Eliquis , presented with worsening of right foot pain.   Symptoms started about 4-5 days ago, patient started develop ulcer on the bottom of right foot, he was prescribed with 2 weeks of doxycycline  late July to early August, and went to see podiatry on August 7, a office debridement was done and patient was sent to wound care center and was instructed to use Betadine  to clean the wound every day and wear cam boot, which patient has followed.  Despite, patient continued to experience worsening of right foot pain.  He denied any fever chills.  He did not see significant discharge or pus coming out from the wound.   Assessment & Plan:   Principal Problem:   Osteomyelitis (HCC) Active Problems:   Acute osteomyelitis of right foot (HCC)  Acute right fourth metatarsal osteomyelitis Acute on chronic right foot diabetic ulcer infection - Failed outpatient management - Discussed with on-call podiatry, who recommended hold off antibiotics as patient does not have symptoms signs of sepsis.  Podiatry will see the patient and decide I&D plan. - Vascular surgery consulted - Status post angiogram 8/22 Plan: Podiatry consulted and patient now status post partial excision of fourth metatarsal on the right foot.  Tolerated procedure well.  Continue p.o. antibiotics.  5-day course.  See cyclin and Augmentin .  Mobility limited.  Will benefit from skilled nursing facility placement.  TOC aware.  Patient otherwise medically stable for discharge.   IDDM SSI.  Hold Lantus  for now.  CBGs ACHS.  Continue carb modified diet   HTN Improved control.  Continue current regimen for now    History of PVD - Continue Plavix    PAF - In sinus rhythm - Resume Eliquis     DVT prophylaxis: Eliquis  Code Status: Full Family Communication: None Disposition Plan: Status is: Inpatient Remains inpatient appropriate because: Multiple acute issues as above   Level of care: Med-Surg  Consultants:  Podiatry Vascular surgery  Procedures:  Lower extremity angiogram 8/22 Partial excision right fourth metatarsal  Antimicrobials: None   Subjective: Seen and examined.  Sitting up in chair.  Weak and limited by pain otherwise stable.  Objective: Vitals:   05/30/24 1604 05/30/24 1945 05/31/24 0405 05/31/24 0825  BP: (!) 189/104 128/89 138/74 (!) 124/90  Pulse: 96 97 84 94  Resp: 16 18 18 16   Temp: 98.6 F (37 C) 98.2 F (36.8 C) 98 F (36.7 C) 97.7 F (36.5 C)  TempSrc:  Oral Oral Oral  SpO2: 100% 100% 100% 99%  Weight:        Intake/Output Summary (Last 24 hours) at 05/31/2024 1207 Last data filed at 05/31/2024 0900 Gross per 24 hour  Intake 480 ml  Output 2475 ml  Net -1995 ml   Filed Weights   05/26/24 2202  Weight: 108.9 kg    Examination:  General exam: NAD Respiratory system: Clear to auscultation. Respiratory effort normal. Cardiovascular system: S1-S2, RRR, no murmurs, no pedal edema Gastrointestinal system: Soft, NT/ND, normal bowel sounds. Central nervous system: Alert and oriented. No focal neurological deficits. Extremities: Left BKA.  Right foot in surgical dressing.  Lower extremity mobility limited.  Gait not assessed Skin: No rashes, lesions  or ulcers Psychiatry: Judgement and insight appear normal. Mood & affect appropriate.     Data Reviewed: I have personally reviewed following labs and imaging studies  CBC: Recent Labs  Lab 05/26/24 2205 05/28/24 1014 05/30/24 0838  WBC 9.6 8.5 7.9  NEUTROABS 6.0 5.1 5.2  HGB 14.0 13.4 13.2  HCT 42.9 39.8 39.3  MCV 84.0 81.1 81.5  PLT 222 192 186   Basic Metabolic Panel: Recent Labs   Lab 05/26/24 2205 05/28/24 1014 05/30/24 0838  NA 141 138 139  K 4.1 4.1 4.3  CL 106 105 103  CO2 26 25 23   GLUCOSE 164* 122* 136*  BUN 10 14 14   CREATININE 0.85 0.89 0.87  CALCIUM  9.3 8.9 9.0   GFR: Estimated Creatinine Clearance: 103.6 mL/min (by C-G formula based on SCr of 0.87 mg/dL). Liver Function Tests: Recent Labs  Lab 05/26/24 2205  AST 18  ALT 15  ALKPHOS 72  BILITOT <0.2  PROT 7.2  ALBUMIN 3.4*   No results for input(s): LIPASE, AMYLASE in the last 168 hours. No results for input(s): AMMONIA in the last 168 hours. Coagulation Profile: Recent Labs  Lab 05/28/24 1014  INR 1.1   Cardiac Enzymes: No results for input(s): CKTOTAL, CKMB, CKMBINDEX, TROPONINI in the last 168 hours. BNP (last 3 results) No results for input(s): PROBNP in the last 8760 hours. HbA1C: No results for input(s): HGBA1C in the last 72 hours.  CBG: Recent Labs  Lab 05/30/24 1136 05/30/24 1642 05/30/24 2051 05/31/24 0823 05/31/24 1155  GLUCAP 163* 116* 156* 158* 161*   Lipid Profile: No results for input(s): CHOL, HDL, LDLCALC, TRIG, CHOLHDL, LDLDIRECT in the last 72 hours. Thyroid Function Tests: No results for input(s): TSH, T4TOTAL, FREET4, T3FREE, THYROIDAB in the last 72 hours. Anemia Panel: No results for input(s): VITAMINB12, FOLATE, FERRITIN, TIBC, IRON, RETICCTPCT in the last 72 hours. Sepsis Labs: Recent Labs  Lab 05/27/24 9082 05/27/24 1057 05/28/24 1014 05/30/24 0838  PROCALCITON  --   --   --  <0.10  LATICACIDVEN 1.3 2.8* 1.4  --     Recent Results (from the past 240 hours)  Blood culture (routine x 2)     Status: None (Preliminary result)   Collection Time: 05/27/24 10:57 AM   Specimen: BLOOD  Result Value Ref Range Status   Specimen Description BLOOD RIGHT ANTECUBITAL  Final   Special Requests   Final    BOTTLES DRAWN AEROBIC ONLY Blood Culture results may not be optimal due to an inadequate  volume of blood received in culture bottles   Culture   Final    NO GROWTH 4 DAYS Performed at [facility], [address] [place], [place] [address]    Report Status PENDING  Incomplete  Blood culture (routine x 2)     Status: None (Preliminary result)   Collection Time: 05/27/24 10:57 AM   Specimen: BLOOD  Result Value Ref Range Status   Specimen Description BLOOD BLOOD RIGHT HAND  Final   Special Requests   Final    BOTTLES DRAWN AEROBIC ONLY Blood Culture results may not be optimal due to an inadequate volume of blood received in culture bottles   Culture   Final    NO GROWTH 4 DAYS Performed at [facility], [address] [place], [place] [address]    Report Status PENDING  Incomplete  Aerobic/Anaerobic Culture w Gram Stain (surgical/deep wound)     Status: None (Preliminary result)   Collection Time: 05/29/24 10:36 AM  Specimen: Wound; Tissue  Result Value Ref Range Status   Specimen Description   Final    WOUND Performed at [facility], [address] [place], [place] [address]    Special Requests   Final    NONE Performed at [facility], [address] [place], [place] [address]    Gram Stain   Final    RARE WBC SEEN NO EPITHELIAL CELLS SEEN  NO ORGANISMS SEEN    Culture   Final    CULTURE REINCUBATED FOR BETTER GROWTH Performed at [facility], [address] [place], [place] [address]    Report Status PENDING  Incomplete  Aerobic/Anaerobic Culture w Gram Stain (surgical/deep wound)     Status: None (Preliminary result)   Collection Time: 05/29/24 10:36 AM   Specimen: Wound; Tissue  Result Value Ref Range Status   Specimen Description   Final    WOUND Performed at [facility], [address] [place], [place] [address]    Special Requests   Final    NONE Performed at [facility], [address] [place], [place] [address]    Gram Stain   Final    RARE WBC SEEN RARE SQUAMOUS  EPITHELIAL CELLS PRESENT NO ORGANISMS SEEN    Culture   Final    CULTURE REINCUBATED FOR BETTER GROWTH Performed at [facility], [address] [place], [place] [address]    Report Status PENDING  Incomplete         Radiology Studies: No results found.       Scheduled Meds:  amoxicillin -clavulanate  1 tablet Oral Q12H   apixaban   5 mg Oral BID   atorvastatin   40 mg Oral Daily   clopidogrel   75 mg Oral Q0600   diclofenac  Sodium  4 g Topical QID   doxycycline   100 mg Oral Q12H   gabapentin   300 mg Oral TID   insulin  aspart  0-20 Units Subcutaneous TID WC   insulin  aspart  0-5 Units Subcutaneous QHS   losartan   25 mg Oral Daily   Continuous Infusions:   LOS: 4 days    Calvin KATHEE Robson, MD [facility]   If 7PM-7AM, please contact night-coverage  05/31/2024, 12:07 PM

## 2024-05-31 NOTE — Care Management Important Message (Signed)
 Important Message  Patient Details  Name: Austin [name] MRN: [id number] Date of Birth: 06/28/1956   Important Message Given:  Yes - Medicare IM     Nakyia Dau W, CMA 05/31/2024, 11:19 AM

## 2024-05-31 NOTE — Plan of Care (Signed)

## 2024-05-31 NOTE — TOC Initial Note (Signed)
 Transition of Care [facility]) - Initial/Assessment Note    Patient Details  Name: Austin [name] MRN: [id number] Date of Birth: [birth year]  [facility]) CM/SW Contact:    Austin L Gentle Hoge, LCSW Phone Number: 05/31/2024, 3:19 PM  Clinical Narrative:                  CSW completed brief assessment with patient. No family at bedside. Patient is a PACE patient. Patient resides alone. SNF recommendations discussed. Patient agreeable to going to a SNF. SNF choices discussed. Patient stated that he will got to [name]. He stated that he was there before.   FL2 will be completed today.        Patient Goals and CMS Choice            Expected Discharge Plan and Services         Expected Discharge Date: 05/31/24                                    Prior Living Arrangements/Services                       Activities of Daily Living   ADL Screening (condition at time of admission) Independently performs ADLs?: Yes (appropriate for developmental age) Is the patient deaf or have difficulty hearing?: No Does the patient have difficulty seeing, even when wearing glasses/contacts?: No Does the patient have difficulty concentrating, remembering, or making decisions?: No  Permission Sought/Granted                  Emotional Assessment              Admission diagnosis:  Osteomyelitis (HCC) [M86.9] Type 2 diabetes mellitus with foot ulcer, with long-term current use of insulin  (HCC) [Z88.378, L97.509, Z79.4] Ulcer of right foot, unspecified ulcer stage (HCC) [L97.519] Patient Active Problem List   Diagnosis Date Noted   Osteomyelitis (HCC) 05/27/2024   Acute cystitis with hematuria 03/01/2024   Rhabdomyolysis 02/29/2024   Fall at home, initial encounter 02/29/2024   DM (diabetes mellitus), type 2 with peripheral vascular complications (HCC) 02/29/2024   Arterial occlusion 12/05/2023   Chronic diastolic CHF (congestive heart failure)  (HCC) 12/04/2023   Toe gangrene (HCC) 11/03/2023   Overweight (BMI 25.0-29.9) 11/03/2023   Right leg pain 06/26/2023   Obesity (BMI 30-39.9) 06/25/2023   DVT (deep venous thrombosis) (HCC)    Critical limb ischemia of right lower extremity (HCC) 04/23/2022   Medication monitoring encounter 03/29/2022   Diabetic foot infection (HCC) 03/29/2022   Acute blood loss anemia 03/21/2022   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) 03/08/2022   Altered mental status    Osteomyelitis of fifth toe of right foot (HCC)    Diabetic foot ulcer associated with type 2 diabetes mellitus (HCC) 02/16/2022   Atrial fibrillation, chronic (HCC) 02/16/2022   Infection of left hand 09/06/2021   Pressure injury of coccygeal region, stage 1 09/06/2021   Coronary artery disease    Peripheral vascular disease (HCC)    Lower limb ulcer, calf (HCC) 07/13/2021   Acute osteomyelitis of right foot (HCC) 03/30/2021   Diabetes mellitus type 2, uncomplicated (HCC) 09/16/2019   Atherosclerosis of native arteries of the extremities with ulceration (HCC) 01/01/2019   Status post below-knee amputation (HCC) - left side 11/30/2018   Type 2 diabetes mellitus with diabetic  peripheral angiopathy without gangrene (HCC) 10/14/2018   Malnutrition of moderate degree 10/02/2018   Foot ulcer (HCC) 09/29/2018   Atherosclerosis of native arteries of extremity with intermittent claudication (HCC) 05/25/2017   Essential hypertension 05/25/2017   Hyperlipidemia 05/25/2017   PCP:  [place], [name] T, MD Pharmacy:   [facility] [address], [place] - [address] [place] [address] Phone: [phone] Fax: [phone]  [name] [address] [place] [address] Phone: [phone] Fax: [phone]  [id number] [place], [place] - [address] [facility] [address] [place] [address] Phone:  [phone] Fax: [phone]  [facility] - [place], [place] - [address] [place] [address] Phone: [phone] Fax: [phone]  [facility] [place], [place] - [address] [place] [address] Phone: [phone] Fax: [phone]  [facility] [address] [place] [address] Phone: [phone] Fax: [phone]  [facility] [address]), [place] - [address] [place]) [place] [address] Phone: [phone] Fax: [phone]     Social Drivers of Health (SDOH) Social History: SDOH Screenings   Food Insecurity: No Food Insecurity (05/27/2024)  Housing: Low Risk  (05/27/2024)  Transportation Needs: No Transportation Needs (05/27/2024)  Utilities: Not At Risk (05/27/2024)  Depression (PHQ2-9): Low Risk  (06/25/2022)  Social Connections: Moderately Isolated (05/27/2024)  Tobacco Use: Medium Risk (05/29/2024)   SDOH Interventions:     Readmission Risk Interventions    11/08/2023    3:14 PM  Readmission Risk Prevention Plan  Transportation Screening Complete  PCP or Specialist Appt within 5-7 Days Complete  Home Care Screening Complete  Medication Review (RN CM) Complete

## 2024-05-31 NOTE — Progress Notes (Signed)
 Physical Therapy Treatment Patient Details Name: Austin [name] MRN: [id number] DOB: Sep 12, 1956 Today's Date: 05/31/2024   History of Present Illness 68 y.o. male with medical history significant of PVD status post right common femoral to TP trunk bypass with vein in 2023, status post left BKA, HTN, IDDM, diabetic neuropathy chronic HFpEF, PAF on Eliquis , presented with worsening of right foot pain. Underwent partial excision of 4th metatarsal of R foot on 05/29/24 due to osteomyelitis.    PT Comments  PT/OT co-treatment performed.  Pt sitting up in recliner upon therapy arrival (L LE prosthesis and R surgical/post op shoe in place); nurse reports requiring 2 assist for transfer bed to recliner earlier.  Pt requiring mod assist x2 squat pivot transfer recliner to manual w/c; unable to stand up to RW (from manual w/c) 1st trial standing; pt adamant on standing to transfer via RW use (instead of switching to squat pivot transfer); 2nd trial standing pt mod assist x2 to stand up to RW and then max assist x2 stand step turn manual w/c to bed using RW (pt's B knees flexed concerning for buckling and pt requiring extra time to take steps).  During session pt appearing very sweaty (gown wet and pillow cover in recliner also noted to be wet) and pt also appeared drowsy; end of session pt's BP 124/76 with HR 104 bpm (HR was 88 bpm at rest beginning of session).  Nurse updated on pt's status (symptoms and vitals).  Will continue to focus on strengthening, balance, and progressive functional mobility during hospitalization.   If plan is discharge home, recommend the following: Two people to help with walking and/or transfers;Help with stairs or ramp for entrance;Two people to help with bathing/dressing/bathroom;Assistance with cooking/housework;Assist for transportation   Can travel by private vehicle     No  Equipment Recommendations  BSC/3in1    Recommendations for Other Services       Precautions /  Restrictions Precautions Precautions: Fall Recall of Precautions/Restrictions: Intact Required Braces or Orthoses: Other Brace Other Brace: Right post op shoe. Also uses Left prosthesis at baseline. Restrictions Weight Bearing Restrictions Per Provider Order: Yes RLE Weight Bearing Per Provider Order: Weight bearing as tolerated Other Position/Activity Restrictions: WBAT R LE surgical shoe; minimal ambulation for transition and bathroom privileges only     Mobility  Bed Mobility Overal bed mobility: Needs Assistance Bed Mobility: Sit to Supine       Sit to supine: Min assist   General bed mobility comments: assist for R LE    Transfers Overall transfer level: Needs assistance Equipment used: Rolling walker (2 wheels) Transfers: Sit to/from Stand, Bed to chair/wheelchair/BSC Sit to Stand: Mod assist, +2 physical assistance   Step pivot transfers: Max assist, +2 physical assistance Squat pivot transfers: Mod assist, +2 physical assistance     General transfer comment: Mod assist x2 squat pivot transfer to R recliner to manual w/c; unable to fully stand 1st trial from manual w/c with 2 assist up to RW; pt adamant on standing and (on 2nd attempt) requiring mod assist x2 to stand fully upright up to RW and then max assist x2 SST manual w/c to bed to L side (pt's B knees flexed; assist for balance; increased effort/time to take steps)    Ambulation/Gait               General Gait Details: Deferred d/t safety concerns   Optometrist  Tilt Bed    Modified Rankin (Stroke Patients Only)       Balance Overall balance assessment: Needs assistance Sitting-balance support: No upper extremity supported, Feet supported Sitting balance-Leahy Scale: Good Sitting balance - Comments: steady reaching within BOS     Standing balance-Leahy Scale: Poor Standing balance comment: 2 assist for safety standing with B UE support on RW (B knees  flexed; assist to shift weight forward)                            Communication Communication Communication: No apparent difficulties  Cognition Arousal: Alert Behavior During Therapy: Flat affect                           PT - Cognition Comments: Pt oriented to person, location, general situation, and month/year (pt reported it was the 26th of August and able to state President) Following commands: Impaired Following commands impaired: Follows multi-step commands inconsistently    Cueing Cueing Techniques: Verbal cues, Visual cues, Tactile cues  Exercises      General Comments General comments (skin integrity, edema, etc.): R foot dressing in place (no drainage noted beginning/end of session).  Nursing cleared pt for participation in physical therapy.  Pt agreeable to PT session.      Pertinent Vitals/Pain Pain Assessment Pain Assessment: Faces Faces Pain Scale: Hurts a little bit Pain Location: R foot Pain Descriptors / Indicators: Discomfort Pain Intervention(s): Limited activity within patient's tolerance, Monitored during session, Premedicated before session, Repositioned    Home Living                          Prior Function            PT Goals (current goals can now be found in the care plan section) Acute Rehab PT Goals Patient Stated Goal: to go home and be in less pain PT Goal Formulation: With patient Time For Goal Achievement: 06/13/24 Potential to Achieve Goals: Good Progress towards PT goals: Progressing toward goals    Frequency    Min 3X/week      PT Plan      Co-evaluation PT/OT/SLP Co-Evaluation/Treatment: Yes Reason for Co-Treatment: For patient/therapist safety;To address functional/ADL transfers PT goals addressed during session: Mobility/safety with mobility;Proper use of DME;Balance OT goals addressed during session: ADL's and self-care      AM-PAC PT 6 Clicks Mobility   Outcome Measure  Help  needed turning from your back to your side while in a flat bed without using bedrails?: A Little Help needed moving from lying on your back to sitting on the side of a flat bed without using bedrails?: A Lot Help needed moving to and from a bed to a chair (including a wheelchair)?: Total Help needed standing up from a chair using your arms (e.g., wheelchair or bedside chair)?: Total Help needed to walk in hospital room?: Total Help needed climbing 3-5 steps with a railing? : Total 6 Click Score: 9    End of Session Equipment Utilized During Treatment: Gait belt Activity Tolerance: Patient limited by fatigue Patient left: in bed;with call bell/phone within reach;with bed alarm set;Other (comment) (R LE elevated via pillow) Nurse Communication: Mobility status;Precautions;Other (comment) (Pt's status during session and vitals) PT Visit Diagnosis: Other abnormalities of gait and mobility (R26.89);Muscle weakness (generalized) (M62.81);Pain Pain - Right/Left: Right Pain - part of body: Ankle and joints of  foot     Time: 8943-8871 PT Time Calculation (min) (ACUTE ONLY): 32 min  Charges:    $Therapeutic Activity: 8-22 mins PT General Charges $$ ACUTE PT VISIT: 1 Visit                     Damien Caulk, PT 05/31/24, 11:58 AM

## 2024-05-31 NOTE — Evaluation (Signed)
 Occupational Therapy Evaluation Patient Details Name: Austin [name] MRN: [id number] DOB: 03-28-1956 Today's Date: 05/31/2024   History of Present Illness   68 y.o. male with medical history significant of PVD status post right common femoral to TP trunk bypass with vein in 2023, status post left BKA, HTN, IDDM, diabetic neuropathy chronic HFpEF, PAF on Eliquis , presented with worsening of right foot pain. Underwent partial excision of 4th metatarsal of R foot on 05/29/24 due to osteomyelitis.     Clinical Impressions Pt was seen for OT evaluation this date. PTA, pt was living alone in a one level home with a ramp to enter. At baseline was Austin I with ADL performance with supervision for bathing from aide. Austin I with mobility using RW and w/c (manual and power) with L LE prosthesis at baseline with no falls in the last 6 months. Reports he was receiving HH services. His sister drives him and gets groceries.   Pt presents with deficits in strength, balance, safety awareness, activity tolerance, affecting safe and optimal ADL completion. Pt found seated in recliner very sweaty and lethargic. Becomes more alert as session goes on, nurse notified of pt's symptoms and he reported feeling fine. Vitals were monitored at end of session prior to return to supine and all stable. He demo squat pivot transfer from recliner to W/C with Austin A x2, cues for hand/feet placement and safety. STS performed from W/C to RW with Max A x2 on first attempt as pt had increased difficulty with anterior weight shift and BLE noted to be flexed and buckling. Pt adamant to attempt 2nd STS trial requiring Austin A x2 this time, however Max A x2 for safe and successful step pivot to return to bed with max cueing for steps. Supervision to doff LLE prosthetic at end of session. Min A for RLE management to return to supine position. Pt would benefit from skilled OT services to address noted impairments and functional limitations  to maximize  safety and independence while minimizing future risk of falls, injury, and readmission. Do anticipate the need for follow up OT services upon acute hospital DC.      If plan is discharge home, recommend the following:   Two people to help with walking and/or transfers;A lot of help with bathing/dressing/bathroom;Assist for transportation;Assistance with cooking/housework;Help with stairs or ramp for entrance     Functional Status Assessment   Patient has had a recent decline in their functional status and demonstrates the ability to make significant improvements in function in a reasonable and predictable amount of time.     Equipment Recommendations   Other (comment) (defer to next  venue)     Recommendations for Other Services         Precautions/Restrictions   Precautions Precautions: Fall Recall of Precautions/Restrictions: Intact Required Braces or Orthoses: Other Brace Other Brace: Right post op shoe. Also uses Left prosthesis at baseline. Restrictions Weight Bearing Restrictions Per Provider Order: Yes RLE Weight Bearing Per Provider Order: Weight bearing as tolerated Other Position/Activity Restrictions: WBAT R LE surgical shoe; minimal ambulation for transition and bathroom privileges only     Mobility Bed Mobility Overal bed mobility: Needs Assistance Bed Mobility: Sit to Supine       Sit to supine: Min assist   General bed mobility comments: RLE management    Transfers Overall transfer level: Needs assistance Equipment used: Rolling walker (2 wheels) Transfers: Sit to/from Stand, Bed to chair/wheelchair/BSC Sit to Stand: Austin assist, +2 physical assistance, Max assist  Squat pivot transfers: Austin assist, +2 physical assistance Step pivot transfers: Max assist, +2 physical assistance     General transfer comment: increased time and cues for all transfers this date, pt lethargic and sweaty, nurse notifed; cueing for hand/feet placement to W/C;  Max A x2 for 1st standing trial, pt adamant to stand again requiring Austin A x2 on second trial with difficulty with anterior weight shift this date then pt able to step pivot to bed with Max A x2; bil knees flexed and buckling      Balance Overall balance assessment: Needs assistance Sitting-balance support: No upper extremity supported, Feet supported Sitting balance-Leahy Scale: Good Sitting balance - Comments: steady reaching within BOS to remove prosthesis   Standing balance support: Reliant on assistive device for balance, Bilateral upper extremity supported Standing balance-Leahy Scale: Poor Standing balance comment: RW and 2 person assist to stand at [facility]                           ADL either performed or assessed with clinical judgement   ADL Overall ADL's : Needs assistance/impaired                 Upper Body Dressing : Minimal assistance;Sitting Upper Body Dressing Details (indicate cue type and reason): to doff/donn new gown Lower Body Dressing: Supervision/safety;Sitting/lateral leans Lower Body Dressing Details (indicate cue type and reason): able to doff prosthesis on LLE seated at EOB Toilet Transfer: Rolling walker (2 wheels);Squat-pivot;Stand-pivot;Maximal assistance;+2 for physical assistance Toilet Transfer Details (indicate cue type and reason): simulated to w/c then from W/C to bed; squat pivot to w/c without RW use and Austin A x2, cues for hand placement; STS from W/C to RW with Max A x2 1st trial and Austin A x2 on 2nd trial, then stand pivot from W/C to bed requiring Max A x2                 Vision         Perception         Praxis         Pertinent Vitals/Pain Pain Assessment Pain Assessment: Faces Faces Pain Scale: Hurts a little bit Pain Location: R foot Pain Descriptors / Indicators: Discomfort Pain Intervention(s): Limited activity within patient's tolerance, Premedicated before session, Repositioned, Monitored during session      Extremity/Trunk Assessment Upper Extremity Assessment Upper Extremity Assessment: Generalized weakness   Lower Extremity Assessment Lower Extremity Assessment: Defer to PT evaluation (LLE prosthesis, R toe amp WBAT in post op shoe)       Communication Communication Communication: No apparent difficulties   Cognition Arousal: Alert Behavior During Therapy: Flat affect                                 Following commands: Impaired Following commands impaired: Follows multi-step commands inconsistently     Cueing  General Comments   Cueing Techniques: Verbal cues;Visual cues;Tactile cues  R foot dressing in place with post op shoe donned for transfers   Exercises Other Exercises Other Exercises: Edu on role of OT in acute setting.   Shoulder Instructions      Home Living Family/patient expects to be discharged to:: Private residence Living Arrangements: Alone Available Help at Discharge: Family;Available PRN/intermittently;Available 24 hours/day;Home health;Personal care attendant (baseline intermittantly but states his sister could stay with him if needed) Type of Home: Mobile home Home  Access: Ramped entrance     Home Layout: One level     Bathroom Shower/Tub: Producer, television/film/video: Handicapped height Bathroom Accessibility: No   Home Equipment: Pharmacist, hospital (2 wheels);Wheelchair - manual;Hospital bed;Wheelchair - power   Additional Comments: Has a relatively new L LE prosthesis.      Prior Functioning/Environment Prior Level of Function : Needs assist             Mobility Comments: Patient he ambulates Austin I at baseline with RW or uses wheelchairs for mobility. Denies falls in the last 6 months. ADLs Comments: Patient reports he is Austin I for ADLs except his aide supervises bathing for safety. Aide helps with IADLs such as houscleaning and sister drives him and gets groceries for him.    OT Problem List:  Decreased strength;Decreased activity tolerance;Impaired balance (sitting and/or standing);Decreased safety awareness;Pain   OT Treatment/Interventions: Self-care/ADL training;Therapeutic exercise;Therapeutic activities;Balance training;DME and/or AE instruction;Patient/family education      OT Goals(Current goals can be found in the care plan section)   Acute Rehab OT Goals Patient Stated Goal: improve strength and function OT Goal Formulation: With patient Time For Goal Achievement: 06/14/24 Potential to Achieve Goals: Fair ADL Goals Pt Will Perform Upper Body Bathing: with supervision;sitting Pt Will Perform Lower Body Dressing: sitting/lateral leans;with supervision Pt Will Transfer to Toilet: with min assist;squat pivot transfer;bedside commode   OT Frequency:  Min 2X/week    Co-evaluation PT/OT/SLP Co-Evaluation/Treatment: Yes Reason for Co-Treatment: For patient/therapist safety;To address functional/ADL transfers PT goals addressed during session: Mobility/safety with mobility;Proper use of DME;Balance OT goals addressed during session: ADL's and self-care      AM-PAC OT 6 Clicks Daily Activity     Outcome Measure Help from another person eating meals?: None Help from another person taking care of personal grooming?: A Little Help from another person toileting, which includes using toliet, bedpan, or urinal?: A Lot Help from another person bathing (including washing, rinsing, drying)?: A Lot Help from another person to put on and taking off regular upper body clothing?: A Little Help from another person to put on and taking off regular lower body clothing?: A Lot 6 Click Score: 16   End of Session Equipment Utilized During Treatment: Gait belt;Rolling walker (2 wheels) Nurse Communication: Mobility status  Activity Tolerance: Patient tolerated treatment well Patient left: in bed;with call bell/phone within reach;with bed alarm set  OT Visit Diagnosis:  Unsteadiness on feet (R26.81);Other abnormalities of gait and mobility (R26.89);Muscle weakness (generalized) (M62.81)                Time: 8943-8871 OT Time Calculation (min): 32 min Charges:  OT General Charges $OT Visit: 1 Visit OT Evaluation $OT Eval Moderate Complexity: 1 Austin [name], OTR/L 05/31/24, 3:08 PM  Austin [name] 05/31/2024, 3:02 PM

## 2024-05-31 NOTE — Progress Notes (Signed)
   Subjective: 2 Days Post-Op Procedure(s) (LRB): IRRIGATION AND DEBRIDEMENT FOOT (Right) EXCISION, METATARSAL BONE, HEAD (Right) DOS:   Objective: Vital signs in last 24 hours: Temp:  [98 F (36.7 C)-98.6 F (37 C)] 98 F (36.7 C) (08/25 0405) Pulse Rate:  [84-97] 84 (08/25 0405) Resp:  [16-18] 18 (08/25 0405) BP: (128-189)/(74-104) 138/74 (08/25 0405) SpO2:  [100 %] 100 % (08/25 0405)  Recent Labs    05/28/24 1014 05/30/24 0838  HGB 13.4 13.2   Recent Labs    05/28/24 1014 05/30/24 0838  WBC 8.5 7.9  RBC 4.91 4.82  HCT 39.8 39.3  PLT 192 186   Recent Labs    05/28/24 1014 05/30/24 0838  NA 138 139  K 4.1 4.3  CL 105 103  CO2 25 23  BUN 14 14  CREATININE 0.89 0.87  GLUCOSE 122* 136*  CALCIUM  8.9 9.0   Recent Labs    05/28/24 1014  INR 1.1    Physical Exam: S/p fourth metatarsal resection RT.   Assessment/Plan: 2 Days Post-Op Procedure(s) (LRB): IRRIGATION AND DEBRIDEMENT FOOT (Right) EXCISION, METATARSAL BONE, HEAD (Right) DOS: 05/29/2024  -Leave dressings clean dry and intact. -Minimal WBAT surgical shoe for transition purposes of bathroom limited is only -Cultures pending -Rec dc on oral abx broad spectrum. Can adjust outpatient as needed.  -From a podiatry standpoint patient okay for discharge. F/u in office 1 week -Will sign off   Thresa CHRISTELLA Sar 05/31/2024, 8:25 AM  Thresa EMERSON Sar, DPM [facility]  Dr. Thresa EMERSON Sar, DPM    [address] [place], [place] [address]                Office [phone]  Fax [phone]

## 2024-05-31 NOTE — [place] FL2 (Signed)
 [place]  MEDICAID [id number] LEVEL OF CARE FORM     IDENTIFICATION  Patient Name: Austin [name] Birthdate: 1955/10/16 Sex: male Admission Date (Current Location): 05/27/2024  [facility] and [place] Number:  Chiropodist and Address:  [facility], [address], [place], [place] [address]      Provider Number: [id number]  Attending Physician Name and Address:  Jhonny Calvin NOVAK, MD  Relative Name and Phone Number:  Lorenza Winkleman [phone]    Current Level of Care: Hospital Recommended Level of Care: Skilled Nursing Facility Prior Approval Number:    Date Approved/Denied: 10/02/18 PASRR Number: [id number]  Discharge Plan: SNF    Current Diagnoses: Patient Active Problem List   Diagnosis Date Noted   Osteomyelitis (HCC) 05/27/2024   Acute cystitis with hematuria 03/01/2024   Rhabdomyolysis 02/29/2024   Fall at home, initial encounter 02/29/2024   DM (diabetes mellitus), type 2 with peripheral vascular complications (HCC) 02/29/2024   Arterial occlusion 12/05/2023   Chronic diastolic CHF (congestive heart failure) (HCC) 12/04/2023   Toe gangrene (HCC) 11/03/2023   Overweight (BMI 25.0-29.9) 11/03/2023   Right leg pain 06/26/2023   Obesity (BMI 30-39.9) 06/25/2023   DVT (deep venous thrombosis) (HCC)    Critical limb ischemia of right lower extremity (HCC) 04/23/2022   Medication monitoring encounter 03/29/2022   Diabetic foot infection (HCC) 03/29/2022   Acute blood loss anemia 03/21/2022   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) 03/08/2022   Altered mental status    Osteomyelitis of fifth toe of right foot (HCC)    Diabetic foot ulcer associated with type 2 diabetes mellitus (HCC) 02/16/2022   Atrial fibrillation, chronic (HCC) 02/16/2022   Infection of left hand 09/06/2021   Pressure injury of coccygeal region, stage 1 09/06/2021   Coronary artery disease    Peripheral vascular disease (HCC)    Lower  limb ulcer, calf (HCC) 07/13/2021   Acute osteomyelitis of right foot (HCC) 03/30/2021   Diabetes mellitus type 2, uncomplicated (HCC) 09/16/2019   Atherosclerosis of native arteries of the extremities with ulceration (HCC) 01/01/2019   Status post below-knee amputation (HCC) - left side 11/30/2018   Type 2 diabetes mellitus with diabetic peripheral angiopathy without gangrene (HCC) 10/14/2018   Malnutrition of moderate degree 10/02/2018   Foot ulcer (HCC) 09/29/2018   Atherosclerosis of native arteries of extremity with intermittent claudication (HCC) 05/25/2017   Essential hypertension 05/25/2017   Hyperlipidemia 05/25/2017    Orientation RESPIRATION BLADDER Height & Weight     Self, Time, Situation, Place  Normal Continent Weight: 240 lb (108.9 kg) Height:     BEHAVIORAL SYMPTOMS/MOOD NEUROLOGICAL BOWEL NUTRITION STATUS      Continent Diet (Diet Carb Modified Fluid consistency: Thin; Room service appropriate? Yes: Carb Control starting at 08/23)  AMBULATORY STATUS COMMUNICATION OF NEEDS Skin   Limited Assist Verbally Other (Comment) (Dry and Flaky)                       Personal Care Assistance Level of Assistance  Bathing, Feeding, Dressing Bathing Assistance: Maximum assistance Feeding assistance: Independent Dressing Assistance: Maximum assistance     Functional Limitations Info  Sight, Hearing, Speech          SPECIAL CARE FACTORS FREQUENCY  PT (By licensed PT), OT (By licensed OT)     PT Frequency: 3x OT Frequency: 2x            Contractures Contractures Info: Not present  Additional Factors Info  Code Status, Allergies Code Status Info: FULL Allergies Info: NKA           Current Medications (05/31/2024):  This is the current hospital active medication list Current Facility-Administered Medications  Medication Dose Route Frequency Provider Last Rate Last Admin   acetaminophen  (TYLENOL ) tablet 650 mg  650 mg Oral Q6H PRN Dew, Jason S, MD    650 mg at 05/31/24 0917   amoxicillin -clavulanate (AUGMENTIN ) 875-125 MG per tablet 1 tablet  1 tablet Oral Q12H Sreenath, Sudheer B, MD   1 tablet at 05/31/24 0916   apixaban  (ELIQUIS ) tablet 5 mg  5 mg Oral BID Sreenath, Sudheer B, MD   5 mg at 05/31/24 9082   atorvastatin  (LIPITOR ) tablet 40 mg  40 mg Oral Daily Dew, Jason S, MD   40 mg at 05/31/24 9082   clopidogrel  (PLAVIX ) tablet 75 mg  75 mg Oral Q0600 Dew, Jason S, MD   75 mg at 05/31/24 9461   diclofenac  Sodium (VOLTAREN ) 1 % topical gel 4 g  4 g Topical QID Sreenath, Sudheer B, MD   4 g at 05/31/24 9083   doxycycline  (VIBRA -TABS) tablet 100 mg  100 mg Oral Q12H Sreenath, Sudheer B, MD   100 mg at 05/31/24 9082   gabapentin  (NEURONTIN ) capsule 300 mg  300 mg Oral TID Sreenath, Sudheer B, MD   300 mg at 05/31/24 9082   insulin  aspart (novoLOG ) injection 0-20 Units  0-20 Units Subcutaneous TID WC Dew, Jason S, MD   4 Units at 05/31/24 1210   insulin  aspart (novoLOG ) injection 0-5 Units  0-5 Units Subcutaneous QHS Dew, Jason S, MD   3 Units at 05/28/24 2115   losartan  (COZAAR ) tablet 25 mg  25 mg Oral Daily Dew, Jason S, MD   25 mg at 05/31/24 9082   morphine  (PF) 2 MG/ML injection 2 mg  2 mg Intravenous Q3H PRN Dew, Jason S, MD       ondansetron  (ZOFRAN ) tablet 4 mg  4 mg Oral Q6H PRN Dew, Jason S, MD       Or   ondansetron  (ZOFRAN ) injection 4 mg  4 mg Intravenous Q6H PRN Dew, Jason S, MD       Oral care mouth rinse  15 mL Mouth Rinse PRN Sreenath, Sudheer B, MD       oxyCODONE  (Oxy IR/ROXICODONE ) immediate release tablet 5-10 mg  5-10 mg Oral Q4H PRN Dew, Jason S, MD   10 mg at 05/31/24 1326   senna-docusate (Senokot-S) tablet 1 tablet  1 tablet Oral QHS PRN Marea Selinda RAMAN, MD         Discharge Medications: Please see discharge summary for a list of discharge medications.  Relevant Imaging Results:  Relevant Lab Results:   Additional Information [id number]  [place] L Maecyn Panning, LCSW

## 2024-06-01 DIAGNOSIS — M86171 Other acute osteomyelitis, right ankle and foot: Secondary | ICD-10-CM | POA: Diagnosis not present

## 2024-06-01 LAB — CULTURE, BLOOD (ROUTINE X 2)
Culture: NO GROWTH
Culture: NO GROWTH

## 2024-06-01 LAB — GLUCOSE, CAPILLARY
Glucose-Capillary: 157 mg/dL — ABNORMAL HIGH (ref 70–99)
Glucose-Capillary: 171 mg/dL — ABNORMAL HIGH (ref 70–99)
Glucose-Capillary: 142 mg/dL — ABNORMAL HIGH (ref 70–99)
Glucose-Capillary: 172 mg/dL — ABNORMAL HIGH (ref 70–99)

## 2024-06-01 LAB — SURGICAL PATHOLOGY

## 2024-06-01 NOTE — Plan of Care (Signed)

## 2024-06-01 NOTE — Progress Notes (Signed)
 Physical Therapy Treatment Patient Details Name: Austin [name] MRN: [id number] DOB: Sep 13, 1956 Today's Date: 06/01/2024   History of Present Illness 68 y.o. male with medical history significant of PVD status post right common femoral to TP trunk bypass with vein in 2023, status post left BKA, HTN, IDDM, diabetic neuropathy chronic HFpEF, PAF on Eliquis , presented with worsening of right foot pain. Underwent partial excision of 4th metatarsal of R foot on 05/29/24 due to osteomyelitis.    PT Comments  Patient seated in recliner on arrival and agreeable to PT treatment session. Noted loose fitting L prosthesis and patient notified sister to bring socks for prosthesis when she visits next. Required modA+2 to stand from recliner x 3 with assist mainly for balance and hand transition. Able to perform lateral scoot transfer with CGA to/from recliner. On last attempt with standing, patient was able to take 3 steps with mod-maxA+2 and RW, however L prosthesis loose fitting which makes it difficult to stabilize in standing. Discharge plan remains appropriate.     If plan is discharge home, recommend the following: Two people to help with walking and/or transfers;Help with stairs or ramp for entrance;Two people to help with bathing/dressing/bathroom;Assistance with cooking/housework;Assist for transportation   Can travel by private vehicle     No  Equipment Recommendations  BSC/3in1    Recommendations for Other Services       Precautions / Restrictions Precautions Precautions: Fall Recall of Precautions/Restrictions: Intact Required Braces or Orthoses: Other Brace Other Brace: Right post op shoe. Also uses Left prosthesis at baseline. Restrictions Weight Bearing Restrictions Per Provider Order: Yes RLE Weight Bearing Per Provider Order: Weight bearing as tolerated (post-op shoe) Other Position/Activity Restrictions: WBAT R LE surgical shoe; minimal ambulation for transition and bathroom  privileges only     Mobility  Bed Mobility               General bed mobility comments: in recliner pre and post session    Transfers Overall transfer level: Needs assistance Equipment used: Rolling Genie Wenke (2 wheels) Transfers: Sit to/from Stand, Bed to chair/wheelchair/BSC Sit to Stand: Mod assist, +2 safety/equipment, +2 physical assistance          Lateral/Scoot Transfers: Contact guard assist General transfer comment: cues for hand placement each stand. Stood x 3-4 times from recliner with modA+2 for stabilizing for hand transition and foot placement. Able to complete lateral scoot to/from recliner with CGA for safety.    Ambulation/Gait Ambulation/Gait assistance: Mod assist, Max assist, +2 physical assistance, +2 safety/equipment Gait Distance (Feet): 2 Feet Assistive device: Rolling Abigaile Rossie (2 wheels) Gait Pattern/deviations: Step-to pattern Gait velocity: decreased     General Gait Details: able to take 3 steps with mod-maxA+2 for balance and RW management. L prosthesis has loose fit which makes it difficulty for patient to balance and utilize LLE adequately   Stairs             Wheelchair Mobility     Tilt Bed    Modified Rankin (Stroke Patients Only)       Balance Overall balance assessment: Needs assistance Sitting-balance support: No upper extremity supported, Feet supported Sitting balance-Leahy Scale: Good     Standing balance support: Bilateral upper extremity supported, Reliant on assistive device for balance, During functional activity Standing balance-Leahy Scale: Poor                              Communication Communication Communication: No apparent difficulties  Cognition Arousal: Alert Behavior During Therapy: Flat affect   PT - Cognitive impairments: No apparent impairments                         Following commands: Intact      Cueing    Exercises      General Comments        Pertinent  Vitals/Pain Pain Assessment Pain Assessment: No/denies pain    Home Living                          Prior Function            PT Goals (current goals can now be found in the care plan section) Acute Rehab PT Goals Patient Stated Goal: to go home and be in less pain PT Goal Formulation: With patient Time For Goal Achievement: 06/13/24 Potential to Achieve Goals: Good Progress towards PT goals: Progressing toward goals    Frequency    Min 3X/week      PT Plan      Co-evaluation PT/OT/SLP Co-Evaluation/Treatment: Yes Reason for Co-Treatment: For patient/therapist safety;To address functional/ADL transfers PT goals addressed during session: Mobility/safety with mobility;Proper use of DME;Balance OT goals addressed during session: ADL's and self-care      AM-PAC PT 6 Clicks Mobility   Outcome Measure  Help needed turning from your back to your side while in a flat bed without using bedrails?: A Little Help needed moving from lying on your back to sitting on the side of a flat bed without using bedrails?: A Lot Help needed moving to and from a bed to a chair (including a wheelchair)?: Total Help needed standing up from a chair using your arms (e.g., wheelchair or bedside chair)?: Total Help needed to walk in hospital room?: Total Help needed climbing 3-5 steps with a railing? : Total 6 Click Score: 9    End of Session   Activity Tolerance: Patient tolerated treatment well Patient left: in chair;with call bell/phone within reach;with chair alarm set Nurse Communication: Mobility status PT Visit Diagnosis: Other abnormalities of gait and mobility (R26.89);Muscle weakness (generalized) (M62.81);Pain Pain - Right/Left: Right Pain - part of body: Ankle and joints of foot     Time: 8955-8890 PT Time Calculation (min) (ACUTE ONLY): 25 min  Charges:    $Therapeutic Activity: 8-22 mins PT General Charges $$ ACUTE PT VISIT: 1 Visit                      Maryanne Finder, PT, DPT Physical Therapist - [facility]  [facility]    Jaylissa Felty A Sofia Jaquith 06/01/2024, 1:44 PM

## 2024-06-01 NOTE — Progress Notes (Signed)
 Occupational Therapy Treatment Patient Details Name: Austin [name] MRN: [id number] DOB: 08/19/1956 Today's Date: 06/01/2024   History of present illness 68 y.o. male with medical history significant of PVD status post right common femoral to TP trunk bypass with vein in 2023, status post left BKA, HTN, IDDM, diabetic neuropathy chronic HFpEF, PAF on Eliquis , presented with worsening of right foot pain. Underwent partial excision of 4th metatarsal of R foot on 05/29/24 due to osteomyelitis.   OT comments  Pt is seated in recliner on arrival. More alert and better able to follow directions this date. He is agreeable to OT/PT co treat session to maximize pt/therapist safety. He denies pain. He continues to have difficulty with RLE knee extension to stand fully upright, able to lift off from recliner with more difficulty trying to release and grab walker on first attempt, therefore transitioned to one hand on RW and one pushing off recliner with RW stabilized by therapists to prevent tipping x2-3 trials requiring Mod A x2. Increased cues for glute squeeze to stand more upright, able to take ~3 steps forward with chair follow using RW and Mod A/Max A x2. He was able to demo lateral scoot transfer from recliner<>bed with CGA. Pt performed chair push-ups from recliner for 10 reps x 2 sets and becomes easily fatigued from this. Pt returned to recliner with all needs in place and will cont to require skilled acute OT services to maximize his safety and IND to return to PLOF.       If plan is discharge home, recommend the following:  Two people to help with walking and/or transfers;A lot of help with bathing/dressing/bathroom;Assist for transportation;Assistance with cooking/housework;Help with stairs or ramp for entrance   Equipment Recommendations  Other (comment) (defer to next venue)    Recommendations for Other Services      Precautions / Restrictions Precautions Precautions: Fall Recall of  Precautions/Restrictions: Intact Required Braces or Orthoses: Other Brace Other Brace: Right post op shoe. Also uses Left prosthesis at baseline. Restrictions Weight Bearing Restrictions Per Provider Order: Yes RLE Weight Bearing Per Provider Order: Weight bearing as tolerated (post-op shoe) Other Position/Activity Restrictions: WBAT R LE surgical shoe; minimal ambulation for transition and bathroom privileges only       Mobility Bed Mobility               General bed mobility comments: in recliner pre and post session    Transfers Overall transfer level: Needs assistance Equipment used: Rolling walker (2 wheels) Transfers: Sit to/from Stand, Bed to chair/wheelchair/BSC Sit to Stand: Mod assist, +2 physical assistance          Lateral/Scoot Transfers: Contact guard assist General transfer comment: pt more alert with improved direction following this date; continues to have difficulty with RLE knee extension to stand fully upright, able to lift off from recliner with more difficulty trying to release and grab walker on first try, transitioned to one hand on RW and one pushing off recliner with RW stabilized by therapists to prevent tipping x2-3 trials; increased cues for glute squeeze to stand more upright, able to take ~3 steps forward with chair follow using RW and Mod A x2     Balance Overall balance assessment: Needs assistance Sitting-balance support: No upper extremity supported, Feet supported Sitting balance-Leahy Scale: Good Sitting balance - Comments: steady reaching within BOS to remove prosthesis   Standing balance support: Reliant on assistive device for balance, Bilateral upper extremity supported Standing balance-Leahy Scale: Poor Standing balance comment:  RW and 2 person assist to stand at [facility]                           ADL either performed or assessed with clinical judgement   ADL Overall ADL's : Needs assistance/impaired     Grooming:  Wash/dry face;Sitting;Set up               Lower Body Dressing: Supervision/safety;Sitting/lateral leans Lower Body Dressing Details (indicate cue type and reason): able to doff and donn prosthesis on LLE seated in recliner Toilet Transfer: Contact guard assist Toilet Transfer Details (indicate cue type and reason): simulated lateral scoot to/from recliner and bed                Extremity/Trunk Assessment              Vision       Perception     Praxis     Communication Communication Communication: No apparent difficulties   Cognition Arousal: Alert Behavior During Therapy: WFL for tasks assessed/performed                                 Following commands: Intact        Cueing      Exercises      Shoulder Instructions       General Comments      Pertinent Vitals/ Pain       Pain Assessment Pain Assessment: No/denies pain Pain Intervention(s): Monitored during session, Repositioned  Home Living                                          Prior Functioning/Environment              Frequency  Min 2X/week        Progress Toward Goals  OT Goals(current goals can now be found in the care plan section)  Progress towards OT goals: Progressing toward goals  Acute Rehab OT Goals Patient Stated Goal: improve strength and function OT Goal Formulation: With patient Time For Goal Achievement: 06/14/24 Potential to Achieve Goals: Fair  Plan      Co-evaluation    PT/OT/SLP Co-Evaluation/Treatment: Yes Reason for Co-Treatment: For patient/therapist safety;To address functional/ADL transfers PT goals addressed during session: Mobility/safety with mobility;Proper use of DME;Balance OT goals addressed during session: ADL's and self-care      AM-PAC OT 6 Clicks Daily Activity     Outcome Measure   Help from another person eating meals?: None Help from another person taking care of personal grooming?: A  Little Help from another person toileting, which includes using toliet, bedpan, or urinal?: A Lot Help from another person bathing (including washing, rinsing, drying)?: A Lot Help from another person to put on and taking off regular upper body clothing?: A Little Help from another person to put on and taking off regular lower body clothing?: A Lot 6 Click Score: 16    End of Session Equipment Utilized During Treatment: Gait belt;Rolling walker (2 wheels)  OT Visit Diagnosis: Unsteadiness on feet (R26.81);Other abnormalities of gait and mobility (R26.89);Muscle weakness (generalized) (M62.81)   Activity Tolerance Patient tolerated treatment well   Patient Left with call bell/phone within reach;in chair;with chair alarm set   Nurse Communication Mobility status  Time: 8955-8890 OT Time Calculation (min): 25 min  Charges: OT General Charges $OT Visit: 1 Visit OT Treatments $Therapeutic Activity: 8-22 mins  Reagan Klemz, OTR/L  06/01/24, 1:45 PM   Roena Sassaman E Akane Tessier 06/01/2024, 1:39 PM

## 2024-06-01 NOTE — Progress Notes (Signed)
 PROGRESS NOTE    Austin [name]  FMW:[id number] DOB: 21-Feb-1956 DOA: 05/27/2024 PCP: [place], [name] T, MD    Brief Narrative:  68 y.o. male with medical history significant of PVD status post right common femoral to TP trunk bypass with vein in 2023, status post left BKA, HTN, IDDM, diabetic neuropathy chronic HFpEF, PAF on Eliquis , presented with worsening of right foot pain.   Symptoms started about 4-5 days ago, patient started develop ulcer on the bottom of right foot, he was prescribed with 2 weeks of doxycycline  late July to early August, and went to see podiatry on August 7, a office debridement was done and patient was sent to wound care center and was instructed to use Betadine  to clean the wound every day and wear cam boot, which patient has followed.  Despite, patient continued to experience worsening of right foot pain.  He denied any fever chills.  He did not see significant discharge or pus coming out from the wound.   Assessment & Plan:   Principal Problem:   Osteomyelitis (HCC) Active Problems:   Acute osteomyelitis of right foot (HCC)  Acute right fourth metatarsal osteomyelitis Acute on chronic right foot diabetic ulcer infection - Failed outpatient management - Discussed with on-call podiatry, who recommended hold off antibiotics as patient does not have symptoms signs of sepsis.  Podiatry will see the patient and decide I&D plan. - Vascular surgery consulted - Status post angiogram 8/22 Plan: Podiatry consulted and patient now status post partial excision of fourth metatarsal on the right foot.  Tolerated procedure well.  Continue p.o. antibiotics.  5-day course.  Doxycycline  and Augmentin .  Last date will be 8/28.  Mobility limited.  Not at baseline.  Appropriate for skilled nursing facility placement.  TOC involved.  Bed search initiated.     IDDM SSI.  Hold Lantus  for now.  CBGs ACHS.  Continue carb modified diet Sugars been well-controlled   HTN Improved  control.  Continue current regimen for now   History of PVD - Continue Plavix  - Hold aspirin  while on Eliquis    PAF - In sinus rhythm - Continue Eliquis     DVT prophylaxis: Eliquis  Code Status: Full Family Communication: None Disposition Plan: Status is: Inpatient Remains inpatient appropriate because: Multiple acute issues as above   Level of care: Med-Surg  Consultants:  Podiatry Vascular surgery  Procedures:  Lower extremity angiogram 8/22 Partial excision right fourth metatarsal  Antimicrobials: None   Subjective: Seen and examined.  Sitting up in chair.  Strength appears improved.  Still limited mobility  Objective: Vitals:   05/31/24 1550 05/31/24 2010 06/01/24 0518 06/01/24 0750  BP: 115/62 118/62 118/67 (!) 146/79  Pulse: 61 (!) 57 84 84  Resp: 16 20 20 15   Temp: (!) 97.2 F (36.2 C) 97.8 F (36.6 C) (!) 97.5 F (36.4 C) 97.6 F (36.4 C)  TempSrc: Oral  Oral Oral  SpO2: 100% 99% 99% 98%  Weight:        Intake/Output Summary (Last 24 hours) at 06/01/2024 1136 Last data filed at 06/01/2024 0900 Gross per 24 hour  Intake 360 ml  Output 675 ml  Net -315 ml   Filed Weights   05/26/24 2202  Weight: 108.9 kg    Examination:  General exam: No acute distress Respiratory system: Clear.  Normal work of breathing.  Room air Cardiovascular system: S1-S2, RRR, no murmurs, no pedal edema Gastrointestinal system: Soft, NT/ND, normal bowel sounds. Central nervous system: Alert and oriented. No focal neurological  deficits. Extremities: Left BKA.  Right foot in surgical dressing.  Lower extremity mobility limited.  Gait not assessed Skin: No rashes, lesions or ulcers Psychiatry: Judgement and insight appear normal. Mood & affect appropriate.     Data Reviewed: I have personally reviewed following labs and imaging studies  CBC: Recent Labs  Lab 05/26/24 2205 05/28/24 1014 05/30/24 0838  WBC 9.6 8.5 7.9  NEUTROABS 6.0 5.1 5.2  HGB 14.0 13.4 13.2   HCT 42.9 39.8 39.3  MCV 84.0 81.1 81.5  PLT 222 192 186   Basic Metabolic Panel: Recent Labs  Lab 05/26/24 2205 05/28/24 1014 05/30/24 0838  NA 141 138 139  K 4.1 4.1 4.3  CL 106 105 103  CO2 26 25 23   GLUCOSE 164* 122* 136*  BUN 10 14 14   CREATININE 0.85 0.89 0.87  CALCIUM  9.3 8.9 9.0   GFR: Estimated Creatinine Clearance: 103.6 mL/min (by C-G formula based on SCr of 0.87 mg/dL). Liver Function Tests: Recent Labs  Lab 05/26/24 2205  AST 18  ALT 15  ALKPHOS 72  BILITOT <0.2  PROT 7.2  ALBUMIN 3.4*   No results for input(s): LIPASE, AMYLASE in the last 168 hours. No results for input(s): AMMONIA in the last 168 hours. Coagulation Profile: Recent Labs  Lab 05/28/24 1014  INR 1.1   Cardiac Enzymes: No results for input(s): CKTOTAL, CKMB, CKMBINDEX, TROPONINI in the last 168 hours. BNP (last 3 results) No results for input(s): PROBNP in the last 8760 hours. HbA1C: No results for input(s): HGBA1C in the last 72 hours.  CBG: Recent Labs  Lab 05/31/24 0823 05/31/24 1155 05/31/24 1640 05/31/24 2118 06/01/24 0753  GLUCAP 158* 161* 153* 158* 157*   Lipid Profile: No results for input(s): CHOL, HDL, LDLCALC, TRIG, CHOLHDL, LDLDIRECT in the last 72 hours. Thyroid Function Tests: No results for input(s): TSH, T4TOTAL, FREET4, T3FREE, THYROIDAB in the last 72 hours. Anemia Panel: No results for input(s): VITAMINB12, FOLATE, FERRITIN, TIBC, IRON, RETICCTPCT in the last 72 hours. Sepsis Labs: Recent Labs  Lab 05/27/24 9082 05/27/24 1057 05/28/24 1014 05/30/24 0838  PROCALCITON  --   --   --  <0.10  LATICACIDVEN 1.3 2.8* 1.4  --     Recent Results (from the past 240 hours)  Blood culture (routine x 2)     Status: None   Collection Time: 05/27/24 10:57 AM   Specimen: BLOOD  Result Value Ref Range Status   Specimen Description BLOOD RIGHT ANTECUBITAL  Final   Special Requests   Final    BOTTLES DRAWN  AEROBIC ONLY Blood Culture results may not be optimal due to an inadequate volume of blood received in culture bottles   Culture   Final    NO GROWTH 5 DAYS Performed at [facility], [address] [place], [place] [address]    Report Status 06/01/2024 FINAL  Final  Blood culture (routine x 2)     Status: None   Collection Time: 05/27/24 10:57 AM   Specimen: BLOOD  Result Value Ref Range Status   Specimen Description BLOOD BLOOD RIGHT HAND  Final   Special Requests   Final    BOTTLES DRAWN AEROBIC ONLY Blood Culture results may not be optimal due to an inadequate volume of blood received in culture bottles   Culture   Final    NO GROWTH 5 DAYS Performed at [facility], [address], [place], [place] [address]    Report Status 06/01/2024 FINAL  Final  Aerobic/Anaerobic  Culture w Gram Stain (surgical/deep wound)     Status: None (Preliminary result)   Collection Time: 05/29/24 10:36 AM   Specimen: Wound; Tissue  Result Value Ref Range Status   Specimen Description   Final    WOUND Performed at [facility], [address], [place], [place] [address]    Special Requests   Final    NONE Performed at [facility], [address] [place], [place] [address]    Gram Stain   Final    RARE WBC SEEN NO EPITHELIAL CELLS SEEN  NO ORGANISMS SEEN Performed at [facility], [address] [place], [place] [address]    Culture   Final    RARE STAPHYLOCOCCUS SPECIES (COAGULASE NEGATIVE) NO ANAEROBES ISOLATED; CULTURE IN PROGRESS FOR 5 DAYS    Report Status PENDING  Incomplete   Organism ID, Bacteria STAPHYLOCOCCUS SPECIES (COAGULASE NEGATIVE)  Final      Susceptibility   Staphylococcus species (coagulase negative) - MIC*    CIPROFLOXACIN <=0.5 SENSITIVE Sensitive     ERYTHROMYCIN RESISTANT Resistant     GENTAMICIN  <=0.5 SENSITIVE Sensitive     OXACILLIN 0.5 RESISTANT Resistant     TETRACYCLINE <=1 SENSITIVE Sensitive     VANCOMYCIN   <=0.5 SENSITIVE Sensitive     TRIMETH/SULFA <=10 SENSITIVE Sensitive     CLINDAMYCIN  RESISTANT Resistant     RIFAMPIN <=0.5 SENSITIVE Sensitive     Inducible Clindamycin  POSITIVE Resistant     * RARE STAPHYLOCOCCUS SPECIES (COAGULASE NEGATIVE)  Aerobic/Anaerobic Culture w Gram Stain (surgical/deep wound)     Status: None (Preliminary result)   Collection Time: 05/29/24 10:36 AM   Specimen: Wound; Tissue  Result Value Ref Range Status   Specimen Description   Final    WOUND Performed at [facility], [address] [place], [place] [address]    Special Requests   Final    NONE Performed at [facility], [address] [place], [place] [address]    Gram Stain   Final    RARE WBC SEEN RARE SQUAMOUS EPITHELIAL CELLS PRESENT NO ORGANISMS SEEN Performed at [facility], [address] [place], [place] [address]    Culture   Final    RARE STAPHYLOCOCCUS CAPRAE NO ANAEROBES ISOLATED; CULTURE IN PROGRESS FOR 5 DAYS    Report Status PENDING  Incomplete   Organism ID, Bacteria STAPHYLOCOCCUS CAPRAE  Final      Susceptibility   Staphylococcus caprae - MIC*    CIPROFLOXACIN <=0.5 SENSITIVE Sensitive     ERYTHROMYCIN >=8 RESISTANT Resistant     GENTAMICIN  <=0.5 SENSITIVE Sensitive     OXACILLIN <=0.25 SENSITIVE Sensitive     TETRACYCLINE <=1 SENSITIVE Sensitive     VANCOMYCIN  <=0.5 SENSITIVE Sensitive     TRIMETH/SULFA <=10 SENSITIVE Sensitive     CLINDAMYCIN  <=0.25 SENSITIVE Sensitive     RIFAMPIN <=0.5 SENSITIVE Sensitive     Inducible Clindamycin  NEGATIVE Sensitive     * RARE STAPHYLOCOCCUS CAPRAE         Radiology Studies: No results found.       Scheduled Meds:  amoxicillin -clavulanate  1 tablet Oral Q12H   apixaban   5 mg Oral BID   atorvastatin   40 mg Oral Daily   clopidogrel   75 mg Oral Q0600   diclofenac  Sodium  4 g Topical QID   doxycycline   100 mg Oral Q12H   gabapentin   300 mg Oral TID   insulin  aspart  0-20 Units Subcutaneous  TID  WC   insulin  aspart  0-5 Units Subcutaneous QHS   losartan   25 mg Oral Daily   Continuous Infusions:   LOS: 5 days    Calvin KATHEE Robson, MD [facility]   If 7PM-7AM, please contact night-coverage  06/01/2024, 11:36 AM

## 2024-06-02 ENCOUNTER — Encounter (INDEPENDENT_AMBULATORY_CARE_PROVIDER_SITE_OTHER)

## 2024-06-02 ENCOUNTER — Ambulatory Visit (INDEPENDENT_AMBULATORY_CARE_PROVIDER_SITE_OTHER): Admitting: Nurse Practitioner

## 2024-06-02 DIAGNOSIS — M86171 Other acute osteomyelitis, right ankle and foot: Secondary | ICD-10-CM | POA: Diagnosis not present

## 2024-06-02 LAB — GLUCOSE, CAPILLARY
Glucose-Capillary: 168 mg/dL — ABNORMAL HIGH (ref 70–99)
Glucose-Capillary: 164 mg/dL — ABNORMAL HIGH (ref 70–99)
Glucose-Capillary: 192 mg/dL — ABNORMAL HIGH (ref 70–99)
Glucose-Capillary: 148 mg/dL — ABNORMAL HIGH (ref 70–99)

## 2024-06-02 MED ORDER — LACTULOSE 10 GM/15ML PO SOLN
30.0000 g | Freq: Once | ORAL | Status: AC
  Administered 2024-06-02: 30 g via ORAL
  Filled 2024-06-02: qty 60

## 2024-06-02 MED ORDER — SENNA 8.6 MG PO TABS
1.0000 | ORAL_TABLET | Freq: Every day | ORAL | Status: DC
  Administered 2024-06-02 – 2024-06-03 (×2): 8.6 mg via ORAL
  Filled 2024-06-02 (×2): qty 1

## 2024-06-02 MED ORDER — POLYETHYLENE GLYCOL 3350 17 G PO PACK
17.0000 g | PACK | Freq: Every day | ORAL | Status: DC
  Administered 2024-06-02: 17 g via ORAL
  Filled 2024-06-02: qty 1

## 2024-06-02 NOTE — Plan of Care (Signed)

## 2024-06-02 NOTE — Progress Notes (Signed)
 PROGRESS NOTE    Austin [name]  FMW:[id number] DOB: 1956/08/07 DOA: 05/27/2024 PCP: [place], [name] T, MD    Brief Narrative:  68 y.o. male with medical history significant of PVD status post right common femoral to TP trunk bypass with vein in 2023, status post left BKA, HTN, IDDM, diabetic neuropathy chronic HFpEF, PAF on Eliquis , presented with worsening of right foot pain.   Symptoms started about 4-5 days ago, patient started develop ulcer on the bottom of right foot, he was prescribed with 2 weeks of doxycycline  late July to early August, and went to see podiatry on August 7, a office debridement was done and patient was sent to wound care center and was instructed to use Betadine  to clean the wound every day and wear cam boot, which patient has followed.  Despite, patient continued to experience worsening of right foot pain.  He denied any fever chills.  He did not see significant discharge or pus coming out from the wound.   Assessment & Plan:   Principal Problem:   Osteomyelitis (HCC) Active Problems:   Acute osteomyelitis of right foot (HCC)  Acute right fourth metatarsal osteomyelitis Acute on chronic right foot diabetic ulcer infection - Failed outpatient management - Vascular surgery consulted - Status post angiogram 8/22 Plan: Podiatry consulted and patient now status post partial excision of fourth metatarsal on the right foot.  Tolerated procedure well.  Continue p.o. antibiotics.  5-day course.  Doxycycline  and Augmentin .  Last date will be 8/28.  Mobility limited.  Not at baseline.  Appropriate for skilled nursing facility placement.  TOC involved.  Bed search initiated. Pending auth. F/u podiatry 1 week   IDDM SSI.  Hold Lantus  for now.  CBGs ACHS.  Continue carb modified diet Sugars been well-controlled   HTN Improved control.  Continue current regimen for now   History of PVD - Continue Plavix  - Hold aspirin  while on Eliquis    PAF - In sinus rhythm -  Continue Eliquis   Constipation - start standing miralax /senna - lactulose  x1    DVT prophylaxis: Eliquis  Code Status: Full Family Communication: None Disposition Plan: Status is: Inpatient Remains inpatient appropriate because: Multiple acute issues as above   Level of care: Med-Surg  Consultants:  Podiatry Vascular surgery  Procedures:  Lower extremity angiogram 8/22 Partial excision right fourth metatarsal  Antimicrobials: None   Subjective: Seen and examined.  Sitting up in bed.  No significant pain. Tolerating diet. No bm  Objective: Vitals:   06/01/24 1954 06/02/24 0350 06/02/24 0725 06/02/24 1445  BP: 127/66 (!) 148/70 (!) 150/86 123/77  Pulse: 64 83 80 (!) 52  Resp: 20 16 16 19   Temp: 98 F (36.7 C) 98.1 F (36.7 C) 98.4 F (36.9 C) (!) 97.5 F (36.4 C)  TempSrc:  Oral Oral Oral  SpO2: 100% 100% 100% 97%  Weight:        Intake/Output Summary (Last 24 hours) at 06/02/2024 1606 Last data filed at 06/02/2024 1300 Gross per 24 hour  Intake 240 ml  Output 550 ml  Net -310 ml   Filed Weights   05/26/24 2202  Weight: 108.9 kg    Examination:  General exam: No acute distress Respiratory system: Clear.  Normal work of breathing.  Room air Cardiovascular system: S1-S2, RRR, no murmurs, no pedal edema Gastrointestinal system: Soft, NT/ND  Central nervous system: Alert and oriented. No focal neurological deficits. Extremities: Left BKA.  Right foot in surgical dressing.  Lower extremity mobility limited.  Skin: no visible lesions Psychiatry: Judgement and insight appear normal. Mood & affect appropriate.     Data Reviewed: I have personally reviewed following labs and imaging studies  CBC: Recent Labs  Lab 05/26/24 2205 05/28/24 1014 05/30/24 0838  WBC 9.6 8.5 7.9  NEUTROABS 6.0 5.1 5.2  HGB 14.0 13.4 13.2  HCT 42.9 39.8 39.3  MCV 84.0 81.1 81.5  PLT 222 192 186   Basic Metabolic Panel: Recent Labs  Lab 05/26/24 2205 05/28/24 1014  05/30/24 0838  NA 141 138 139  K 4.1 4.1 4.3  CL 106 105 103  CO2 26 25 23   GLUCOSE 164* 122* 136*  BUN 10 14 14   CREATININE 0.85 0.89 0.87  CALCIUM  9.3 8.9 9.0   GFR: Estimated Creatinine Clearance: 103.6 mL/min (by C-G formula based on SCr of 0.87 mg/dL). Liver Function Tests: Recent Labs  Lab 05/26/24 2205  AST 18  ALT 15  ALKPHOS 72  BILITOT <0.2  PROT 7.2  ALBUMIN 3.4*   No results for input(s): LIPASE, AMYLASE in the last 168 hours. No results for input(s): AMMONIA in the last 168 hours. Coagulation Profile: Recent Labs  Lab 05/28/24 1014  INR 1.1   Cardiac Enzymes: No results for input(s): CKTOTAL, CKMB, CKMBINDEX, TROPONINI in the last 168 hours. BNP (last 3 results) No results for input(s): PROBNP in the last 8760 hours. HbA1C: No results for input(s): HGBA1C in the last 72 hours.  CBG: Recent Labs  Lab 06/01/24 1149 06/01/24 1641 06/01/24 1952 06/02/24 0723 06/02/24 1144  GLUCAP 171* 142* 172* 168* 164*   Lipid Profile: No results for input(s): CHOL, HDL, LDLCALC, TRIG, CHOLHDL, LDLDIRECT in the last 72 hours. Thyroid Function Tests: No results for input(s): TSH, T4TOTAL, FREET4, T3FREE, THYROIDAB in the last 72 hours. Anemia Panel: No results for input(s): VITAMINB12, FOLATE, FERRITIN, TIBC, IRON, RETICCTPCT in the last 72 hours. Sepsis Labs: Recent Labs  Lab 05/27/24 9082 05/27/24 1057 05/28/24 1014 05/30/24 0838  PROCALCITON  --   --   --  <0.10  LATICACIDVEN 1.3 2.8* 1.4  --     Recent Results (from the past 240 hours)  Blood culture (routine x 2)     Status: None   Collection Time: 05/27/24 10:57 AM   Specimen: BLOOD  Result Value Ref Range Status   Specimen Description BLOOD RIGHT ANTECUBITAL  Final   Special Requests   Final    BOTTLES DRAWN AEROBIC ONLY Blood Culture results may not be optimal due to an inadequate volume of blood received in culture bottles   Culture   Final     NO GROWTH 5 DAYS Performed at [facility], [address] [place], [place] [address]    Report Status 06/01/2024 FINAL  Final  Blood culture (routine x 2)     Status: None   Collection Time: 05/27/24 10:57 AM   Specimen: BLOOD  Result Value Ref Range Status   Specimen Description BLOOD BLOOD RIGHT HAND  Final   Special Requests   Final    BOTTLES DRAWN AEROBIC ONLY Blood Culture results may not be optimal due to an inadequate volume of blood received in culture bottles   Culture   Final    NO GROWTH 5 DAYS Performed at [facility], [address] [place], [place] [address]    Report Status 06/01/2024 FINAL  Final  Aerobic/Anaerobic Culture w Gram Stain (surgical/deep wound)     Status: None (Preliminary result)   Collection Time: 05/29/24 10:36 AM  Specimen: Wound; Tissue  Result Value Ref Range Status   Specimen Description   Final    WOUND Performed at [facility], [address], [place], [place] [address]    Special Requests   Final    NONE Performed at [facility], [address] [place], [place] [address]    Gram Stain   Final    RARE WBC SEEN NO EPITHELIAL CELLS SEEN  NO ORGANISMS SEEN Performed at [facility], [address], [place], [place] [address]    Culture   Final    RARE STAPHYLOCOCCUS SPECIES (COAGULASE NEGATIVE) NO ANAEROBES ISOLATED; CULTURE IN PROGRESS FOR 5 DAYS    Report Status PENDING  Incomplete   Organism ID, Bacteria STAPHYLOCOCCUS SPECIES (COAGULASE NEGATIVE)  Final      Susceptibility   Staphylococcus species (coagulase negative) - MIC*    CIPROFLOXACIN <=0.5 SENSITIVE Sensitive     ERYTHROMYCIN RESISTANT Resistant     GENTAMICIN  <=0.5 SENSITIVE Sensitive     OXACILLIN 0.5 RESISTANT Resistant     TETRACYCLINE <=1 SENSITIVE Sensitive     VANCOMYCIN  <=0.5 SENSITIVE Sensitive     TRIMETH/SULFA <=10 SENSITIVE Sensitive     CLINDAMYCIN  RESISTANT Resistant     RIFAMPIN <=0.5 SENSITIVE  Sensitive     Inducible Clindamycin  POSITIVE Resistant     * RARE STAPHYLOCOCCUS SPECIES (COAGULASE NEGATIVE)  Aerobic/Anaerobic Culture w Gram Stain (surgical/deep wound)     Status: None (Preliminary result)   Collection Time: 05/29/24 10:36 AM   Specimen: Wound; Tissue  Result Value Ref Range Status   Specimen Description   Final    WOUND Performed at [facility], [address] [place], [place] [address]    Special Requests   Final    NONE Performed at [facility], [address] [place], [place] [address]    Gram Stain   Final    RARE WBC SEEN RARE SQUAMOUS EPITHELIAL CELLS PRESENT NO ORGANISMS SEEN Performed at [facility], [address], [place], [place] [address]    Culture   Final    RARE STAPHYLOCOCCUS CAPRAE NO ANAEROBES ISOLATED; CULTURE IN PROGRESS FOR 5 DAYS    Report Status PENDING  Incomplete   Organism ID, Bacteria STAPHYLOCOCCUS CAPRAE  Final      Susceptibility   Staphylococcus caprae - MIC*    CIPROFLOXACIN <=0.5 SENSITIVE Sensitive     ERYTHROMYCIN >=8 RESISTANT Resistant     GENTAMICIN  <=0.5 SENSITIVE Sensitive     OXACILLIN <=0.25 SENSITIVE Sensitive     TETRACYCLINE <=1 SENSITIVE Sensitive     VANCOMYCIN  <=0.5 SENSITIVE Sensitive     TRIMETH/SULFA <=10 SENSITIVE Sensitive     CLINDAMYCIN  <=0.25 SENSITIVE Sensitive     RIFAMPIN <=0.5 SENSITIVE Sensitive     Inducible Clindamycin  NEGATIVE Sensitive     * RARE STAPHYLOCOCCUS CAPRAE         Radiology Studies: No results found.       Scheduled Meds:  amoxicillin -clavulanate  1 tablet Oral Q12H   apixaban   5 mg Oral BID   atorvastatin   40 mg Oral Daily   clopidogrel   75 mg Oral Q0600   diclofenac  Sodium  4 g Topical QID   doxycycline   100 mg Oral Q12H   gabapentin   300 mg Oral TID   insulin  aspart  0-20 Units Subcutaneous TID WC   insulin  aspart  0-5 Units Subcutaneous QHS   losartan   25 mg Oral Daily   Continuous Infusions:  LOS: 6 days    Austin KATHEE Ban, MD [facility]   If 7PM-7AM, please contact night-coverage  06/02/2024, 4:06 PM

## 2024-06-02 NOTE — Plan of Care (Signed)
  Problem: Coping: Goal: Ability to adjust to condition or change in health will improve Outcome: Progressing   Problem: Fluid Volume: Goal: Ability to maintain a balanced intake and output will improve Outcome: Progressing   Problem: Nutritional: Goal: Maintenance of adequate nutrition will improve Outcome: Progressing   Problem: Coping: Goal: Level of anxiety will decrease Outcome: Progressing   Problem: Elimination: Goal: Will not experience complications related to urinary retention Outcome: Progressing   Problem: Pain Managment: Goal: General experience of comfort will improve and/or be controlled Outcome: Progressing   Problem: Safety: Goal: Ability to remain free from injury will improve Outcome: Progressing

## 2024-06-02 NOTE — TOC Progression Note (Addendum)
 Transition of Care [facility]) - Progression Note    Patient Details  Name: Austin [name] MRN: [id number] Date of Birth: [birth year]  Transition of Care [facility]) CM/SW Contact  Alvaro Louder, [place] Phone Number: 06/02/2024, 3:53 PM  Clinical Narrative:  4:23 PM: Auth Approved Approved [id number] Dates:8/27-8/29/2025 Next Review Date: 06/04/2024    LCSWA reached out to [facility] for bed availability. Admission coordinator indicated there is none as of right now. LCSWA met with patient at the bedside and let him know of no bed availability at [facility] Patient switched facilities to [facility]. LCSWA started insurance auth   TOC to follow for discharge                      Expected Discharge Plan and Services         Expected Discharge Date: 05/31/24                                     Social Drivers of Health (SDOH) Interventions SDOH Screenings   Food Insecurity: No Food Insecurity (05/27/2024)  Housing: Low Risk  (05/27/2024)  Transportation Needs: No Transportation Needs (05/27/2024)  Utilities: Not At Risk (05/27/2024)  Depression (PHQ2-9): Low Risk  (06/25/2022)  Social Connections: Moderately Isolated (05/27/2024)  Tobacco Use: Medium Risk (05/29/2024)    Readmission Risk Interventions    11/08/2023    3:14 PM  Readmission Risk Prevention Plan  Transportation Screening Complete  PCP or Specialist Appt within 5-7 Days Complete  Home Care Screening Complete  Medication Review (RN CM) Complete

## 2024-06-03 DIAGNOSIS — M86171 Other acute osteomyelitis, right ankle and foot: Secondary | ICD-10-CM | POA: Diagnosis not present

## 2024-06-03 LAB — AEROBIC/ANAEROBIC CULTURE W GRAM STAIN (SURGICAL/DEEP WOUND)

## 2024-06-03 LAB — GLUCOSE, CAPILLARY
Glucose-Capillary: 148 mg/dL — ABNORMAL HIGH (ref 70–99)
Glucose-Capillary: 138 mg/dL — ABNORMAL HIGH (ref 70–99)

## 2024-06-03 MED ORDER — OXYCODONE HCL 5 MG PO TABS: 5.0000 mg | ORAL_TABLET | ORAL | 0 refills | Status: DC | PRN

## 2024-06-03 MED ORDER — HUMALOG KWIKPEN 100 UNIT/ML ~~LOC~~ SOPN: 5.0000 [IU] | PEN_INJECTOR | Freq: Three times a day (TID) | SUBCUTANEOUS | Status: AC

## 2024-06-03 MED ORDER — INSULIN GLARGINE 100 UNIT/ML SOLOSTAR PEN: 5.0000 [IU] | PEN_INJECTOR | Freq: Every day | SUBCUTANEOUS | Status: AC

## 2024-06-03 NOTE — Plan of Care (Signed)

## 2024-06-03 NOTE — Discharge Summary (Addendum)
 Austin [name] FMW:[id number] DOB: 06/30/1956 DOA: 05/27/2024  PCP: [place], [name] T, MD  Admit date: 05/27/2024 Discharge date: 06/03/2024  Time spent: 35 minutes  Recommendations for Outpatient Follow-up:  Podiatry f/u 9/2 (scheduled) Vascular surgery f/u 4 weeks (need to call and schedule) Pcp f/u after discharge from SNF  WBAT surgical shoe, no need to change dressing assuming it remains clean and dry (otherwise do change)    Discharge Diagnoses:  Principal Problem:   Osteomyelitis (HCC) Active Problems:   Acute osteomyelitis of right foot [facility])   Discharge Condition: stable  Diet recommendation: heart healthy  Filed Weights   05/26/24 2202  Weight: 108.9 kg    History of present illness:  From admission h and p Austin [name] is a 68 y.o. male with medical history significant of PVD status post right common femoral to TP trunk bypass with vein in 2023, status post left BKA, HTN, IDDM, diabetic neuropathy chronic HFpEF, PAF on Eliquis , presented with worsening of right foot pain.   Symptoms started about 4-5 days ago, patient started develop ulcer on the bottom of right foot, he was prescribed with 2 weeks of doxycycline  late July to early August, and went to see podiatry on August 7, a office debridement was done and patient was sent to wound care center and was instructed to use Betadine  to clean the wound every day and wear cam boot, which patient has followed.  Despite, patient continued to experience worsening of right foot pain.  He denied any fever chills.  He did not see significant discharge or pus coming out from the wound.  Hospital Course:   Acute right fourth metatarsal osteomyelitis Acute on chronic right foot diabetic ulcer infection PAD - Failed outpatient management - Vascular surgery consulted - Status post angiogram 8/22 Plan: Podiatry consulted and patient now status post partial excision of fourth metatarsal on the right foot.  Completed a 5 day  course of Doxycycline  and Augmentin .  To SNF. Needs podiatry f/u 1 week, vascular surgery f/u 4 weeks Continuing aspirin /plavix    IDDM Resume home meds at discharge but at lower doses   HTN Improved control.  Continue current regimen for now   PAF - In sinus rhythm - Continue Eliquis     Procedures: Angiogram with balloon angioplasty 8/22 Partial excision of fourth metatarsal right foot 8/23  Consultations: Vascular podiatry  Discharge Exam: Vitals:   06/03/24 0402 06/03/24 0820  BP: (!) 143/96 (!) 152/76  Pulse: 81 (!) 54  Resp: 14 20  Temp: (!) 97.4 F (36.3 C) 97.7 F (36.5 C)  SpO2: 97% 100%   General exam: No acute distress Respiratory system: Clear.  Normal work of breathing.  Room air Cardiovascular system: S1-S2, RRR, no murmurs, no pedal edema Gastrointestinal system: Soft, NT/ND  Central nervous system: Alert and oriented. No focal neurological deficits. Extremities: Left BKA.  Right foot in surgical dressing.  Lower extremity mobility limited.    Skin: no visible lesions Psychiatry: Judgement and insight appear normal. Mood & affect appropriate.   Discharge Instructions   Discharge Instructions     Diet - low sodium heart healthy   Complete by: As directed    Diet - low sodium heart healthy   Complete by: As directed    Increase activity slowly   Complete by: As directed    Increase activity slowly   Complete by: As directed       Allergies as of 06/03/2024   No Known Allergies  Medication List     STOP taking these medications    doxycycline  100 MG tablet Commonly known as: VIBRA -TABS       TAKE these medications    acetaminophen  325 MG tablet Commonly known as: TYLENOL  Take 2 tablets (650 mg total) by mouth every 6 (six) hours as needed for mild pain (pain score 1-3) or fever.   ascorbic acid  500 MG tablet Commonly known as: VITAMIN C  Take 1 tablet (500 mg total) by mouth 2 (two) times daily.   atorvastatin  40 MG  tablet Commonly known as: LIPITOR  Take 40 mg by mouth daily.   clopidogrel  75 MG tablet Commonly known as: PLAVIX  Take 1 tablet (75 mg total) by mouth daily at 6 (six) AM.   Eliquis  5 MG Tabs tablet Generic drug: apixaban  Take 5 mg by mouth 2 (two) times daily.   gabapentin  100 MG capsule Commonly known as: NEURONTIN  Take 100 mg by mouth 3 (three) times daily.   HumaLOG  KwikPen 100 UNIT/ML KwikPen Generic drug: insulin  lispro Inject 5 Units into the skin 3 (three) times daily. What changed: how much to take   insulin  glargine 100 UNIT/ML Solostar Pen Commonly known as: LANTUS  Inject 5 Units into the skin at bedtime. What changed: how much to take   losartan  25 MG tablet Commonly known as: COZAAR  Take 1 tablet (25 mg total) by mouth daily.   nicotine  21 mg/24hr patch Commonly known as: NICODERM CQ  - dosed in mg/24 hours Place 1 patch (21 mg total) onto the skin daily.   oxyCODONE  5 MG immediate release tablet Commonly known as: Oxy IR/ROXICODONE  Take 1-2 tablets (5-10 mg total) by mouth every 4 (four) hours as needed for moderate pain (pain score 4-6) or severe pain (pain score 7-10).   Trulicity 1.5 MG/0.5ML Soaj Generic drug: Dulaglutide Inject 1.5 mg into the skin once a week.   Vitamin D3 125 MCG (5000 UT) Caps Take 1 capsule by mouth daily.   Zinc  Sulfate 220 (50 Zn) MG Tabs Take 1 tablet (220 mg total) by mouth daily after supper.       No Known Allergies  Contact information for follow-up providers     [place], [name] T, MD .   Specialty: [facility] Medicine Contact information: [address] [place] [address] [phone]         Neill Boas, DPM Follow up.   Specialty: Podiatry Why: call to schedule an appointment next week Contact information: [address] [place] [address] [phone]         Delores Orvin BRAVO, NP Follow up in 4 week(s).   Specialty: Vascular Surgery Why: with ABIs Contact information: [address] [place] [address] [phone]              Contact information for after-discharge care     Destination     [facility] [place], [place]. [place]   Service: Skilled Nursing Contact information: [address] [place]  [address] [phone]                      The results of significant diagnostics from this hospitalization (including imaging, microbiology, ancillary and laboratory) are listed below for reference.    Significant Diagnostic Studies: DG Foot Complete Right Result Date: 05/29/2024 EXAM: 3 or more VIEW(S) XRAY OF THE RIGHT FOOT 05/29/2024 11:48:00 AM COMPARISON: Preoperative imaging. CLINICAL HISTORY: 801657 Postop check [id number]. Post op FINDINGS: Interval transmetatarsal amputation of the fourth ray. Previous fifth  transmetatarsal amputation. Expected postoperative changes in the overlying soft tissues with skin staples in place. IMPRESSION: 1. Interval transmetatarsal of the fourth ray. 2. Previous fifth metatarsal amputation. Electronically signed by: Andrea Gasman MD 05/29/2024 02:23 PM EDT RP Workstation: [id number]   PERIPHERAL VASCULAR CATHETERIZATION Result Date: 05/28/2024 See surgical note for result.  US  ARTERIAL ABI (SCREENING LOWER EXTREMITY) Result Date: 05/27/2024 CLINICAL DATA:  Osteomyelitis of the right fourth metatarsal. Prior left lower extremity below-knee amputation. History of peripheral vascular disease and diabetes. EXAM: NONINVASIVE PHYSIOLOGIC VASCULAR STUDY OF BILATERAL LOWER EXTREMITIES TECHNIQUE: Evaluation of both lower extremities were performed at rest, including calculation of ankle-brachial indices with single level Doppler, pressure and pulse volume recording. COMPARISON:  None Available. FINDINGS: Right ABI:  0.77 Left ABI:  N/A Right Lower Extremity: Monophasic posterior tibial and dorsalis pedis waveforms. 0.5-0.79 Moderate PAD IMPRESSION: Evidence moderate arterial occlusive disease of the  right lower extremity with resting ankle-brachial index of 0.77 and monophasic distal waveforms. Electronically Signed   By: Marcey Moan M.D.   On: 05/27/2024 12:25   MR FOOT RIGHT W WO CONTRAST Result Date: 05/27/2024 CLINICAL DATA:  Open wound along the ball of the foot.  Diabetes. EXAM: MRI OF THE RIGHT FOREFOOT WITHOUT AND WITH CONTRAST TECHNIQUE: Multiplanar, multisequence MR imaging of the right foot from the midfoot through the toes was performed before and after the administration of intravenous contrast. CONTRAST:  10mL GADAVIST  GADOBUTROL  1 MMOL/ML IV SOLN COMPARISON:  05/27/2024 radiographs FINDINGS: Bones/Joint/Cartilage Prior amputation of the fourth ray at the MTP joint. Prior amputation of the fifth ray at the mid metatarsal. Abnormal marrow edema and enhancement in the head of the fourth metatarsal compatible with active osteomyelitis. Ligaments Lisfranc ligament intact. Muscles and Tendons Generalized regional muscular atrophy. Soft tissues Plantar draining sinus tract from the fourth metatarsal head to the cutaneous surface, image 27 series 10. Questionable additional early sinus tract formation extending dorsally and distally from the metatarsal head on images 15 through 17 of series 12. Prominent dorsal subcutaneous infiltrative edema in the forefoot. Cellulitis not excluded. This tracks into the remaining toes. IMPRESSION: 1. 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 2. Plantar draining sinus tract from the fourth metatarsal head to the cutaneous surface. Questionable additional early sinus tract formation extending dorsally and distally from the fourth metatarsal head. 3. Prominent dorsal subcutaneous infiltrative edema in the forefoot. Cellulitis not excluded. 4. Prior amputation of the fourth ray at the MTP joint. Prior amputation of the fifth ray at the mid metatarsal. Electronically Signed   By: Ryan Salvage M.D.   On: 05/27/2024 08:48   US  Venous Img Lower  Unilateral Right Result Date: 05/27/2024 CLINICAL DATA:  Right lower extremity edema EXAM: Right LOWER EXTREMITY VENOUS DOPPLER ULTRASOUND TECHNIQUE: Gray-scale sonography with compression, as well as color and duplex ultrasound, were performed to evaluate the deep venous system(s) from the level of the common femoral vein through the popliteal and proximal calf veins. COMPARISON:  None Available. FINDINGS: VENOUS Normal compressibility of the common femoral, superficial femoral, and popliteal veins, as well as the visualized calf veins. Visualized portions of profunda femoral vein is unremarkable. The great saphenous vein was not visualized. No filling defects to suggest DVT on grayscale or color Doppler imaging. Doppler waveforms show normal direction of venous flow, normal respiratory plasticity and response to augmentation. Limited views of the contralateral common femoral vein are unremarkable. OTHER 1.1 cm short axis node in the right groin is likely reactive. Limitations: none IMPRESSION:  Negative for acute DVT. Electronically Signed   By: Norman Gatlin M.D.   On: 05/27/2024 02:20   DG Foot Complete Right Result Date: 05/27/2024 CLINICAL DATA:  Right foot pain, plantar wound EXAM: RIGHT FOOT COMPLETE - 3+ VIEW COMPARISON:  05/03/2024 FINDINGS: Amputation of the fourth toe at the MTP joint and the fifth toe at the proximal metatarsal. No acute fracture or dislocation. No evidence of osteomyelitis. Swelling about the forefoot. IMPRESSION: No evidence of osteomyelitis. Electronically Signed   By: Norman Gatlin M.D.   On: 05/27/2024 01:07    Microbiology: Recent Results (from the past 240 hours)  Blood culture (routine x 2)     Status: None   Collection Time: 05/27/24 10:57 AM   Specimen: BLOOD  Result Value Ref Range Status   Specimen Description BLOOD RIGHT ANTECUBITAL  Final   Special Requests   Final    BOTTLES DRAWN AEROBIC ONLY Blood Culture results may not be optimal due to an inadequate  volume of blood received in culture bottles   Culture   Final    NO GROWTH 5 DAYS Performed at [facility], [address] [place], [place] [address]    Report Status 06/01/2024 FINAL  Final  Blood culture (routine x 2)     Status: None   Collection Time: 05/27/24 10:57 AM   Specimen: BLOOD  Result Value Ref Range Status   Specimen Description BLOOD BLOOD RIGHT HAND  Final   Special Requests   Final    BOTTLES DRAWN AEROBIC ONLY Blood Culture results may not be optimal due to an inadequate volume of blood received in culture bottles   Culture   Final    NO GROWTH 5 DAYS Performed at [facility], [address], [place], [place] [address]    Report Status 06/01/2024 FINAL  Final  Aerobic/Anaerobic Culture w Gram Stain (surgical/deep wound)     Status: None (Preliminary result)   Collection Time: 05/29/24 10:36 AM   Specimen: Wound; Tissue  Result Value Ref Range Status   Specimen Description   Final    WOUND Performed at [facility], [address] [place], [place] [address]    Special Requests   Final    NONE Performed at [facility], [address] [place], [place] [address]    Gram Stain   Final    RARE WBC SEEN NO EPITHELIAL CELLS SEEN  NO ORGANISMS SEEN Performed at [facility], [address], [name], [place] [address]    Culture   Final    RARE STAPHYLOCOCCUS SPECIES (COAGULASE NEGATIVE) NO ANAEROBES ISOLATED; CULTURE IN PROGRESS FOR 5 DAYS    Report Status PENDING  Incomplete   Organism ID, Bacteria STAPHYLOCOCCUS SPECIES (COAGULASE NEGATIVE)  Final      Susceptibility   Staphylococcus species (coagulase negative) - MIC*    CIPROFLOXACIN <=0.5 SENSITIVE Sensitive     ERYTHROMYCIN RESISTANT Resistant     GENTAMICIN  <=0.5 SENSITIVE Sensitive     OXACILLIN 0.5 RESISTANT Resistant     TETRACYCLINE <=1 SENSITIVE Sensitive     VANCOMYCIN  <=0.5 SENSITIVE Sensitive     TRIMETH/SULFA <=10 SENSITIVE Sensitive      CLINDAMYCIN  RESISTANT Resistant     RIFAMPIN <=0.5 SENSITIVE Sensitive     Inducible Clindamycin  POSITIVE Resistant     * RARE STAPHYLOCOCCUS SPECIES (COAGULASE NEGATIVE)  Aerobic/Anaerobic Culture w Gram Stain (surgical/deep wound)     Status: None (Preliminary result)   Collection Time: 05/29/24 10:36 AM  Specimen: Wound; Tissue  Result Value Ref Range Status   Specimen Description   Final    WOUND Performed at [facility], [address] [place], [place] [address]    Special Requests   Final    NONE Performed at [facility], [address] [place], [place] [address]    Gram Stain   Final    RARE WBC SEEN RARE SQUAMOUS EPITHELIAL CELLS PRESENT NO ORGANISMS SEEN Performed at [facility], [address], [place], [place] [address]    Culture   Final    RARE STAPHYLOCOCCUS CAPRAE NO ANAEROBES ISOLATED; CULTURE IN PROGRESS FOR 5 DAYS    Report Status PENDING  Incomplete   Organism ID, Bacteria STAPHYLOCOCCUS CAPRAE  Final      Susceptibility   Staphylococcus caprae - MIC*    CIPROFLOXACIN <=0.5 SENSITIVE Sensitive     ERYTHROMYCIN >=8 RESISTANT Resistant     GENTAMICIN  <=0.5 SENSITIVE Sensitive     OXACILLIN <=0.25 SENSITIVE Sensitive     TETRACYCLINE <=1 SENSITIVE Sensitive     VANCOMYCIN  <=0.5 SENSITIVE Sensitive     TRIMETH/SULFA <=10 SENSITIVE Sensitive     CLINDAMYCIN  <=0.25 SENSITIVE Sensitive     RIFAMPIN <=0.5 SENSITIVE Sensitive     Inducible Clindamycin  NEGATIVE Sensitive     * RARE STAPHYLOCOCCUS CAPRAE     Labs: Basic Metabolic Panel: Recent Labs  Lab 05/28/24 1014 05/30/24 0838  NA 138 139  K 4.1 4.3  CL 105 103  CO2 25 23  GLUCOSE 122* 136*  BUN 14 14  CREATININE 0.89 0.87  CALCIUM  8.9 9.0   Liver Function Tests: No results for input(s): AST, ALT, ALKPHOS, BILITOT, PROT, ALBUMIN in the last 168 hours. No results for input(s): LIPASE, AMYLASE in the last 168 hours. No results for input(s):  AMMONIA in the last 168 hours. CBC: Recent Labs  Lab 05/28/24 1014 05/30/24 0838  WBC 8.5 7.9  NEUTROABS 5.1 5.2  HGB 13.4 13.2  HCT 39.8 39.3  MCV 81.1 81.5  PLT 192 186   Cardiac Enzymes: No results for input(s): CKTOTAL, CKMB, CKMBINDEX, TROPONINI in the last 168 hours. BNP: BNP (last 3 results) Recent Labs    12/04/23 1744 02/29/24 1501  BNP 29.3 271.6*    ProBNP (last 3 results) No results for input(s): PROBNP in the last 8760 hours.  CBG: Recent Labs  Lab 06/02/24 0723 06/02/24 1144 06/02/24 1631 06/02/24 2127 06/03/24 0740  GLUCAP 168* 164* 192* 148* 148*       Signed:  Devaughn KATHEE Ban MD.  [facility] 06/03/2024, 10:56 AM

## 2024-06-03 NOTE — TOC Transition Note (Signed)
 Transition of Care [facility]) - Discharge Note   Patient Details  Name: Austin [name] MRN: [id number] Date of Birth: 15-Jun-1956  Transition of Care [facility] CM/SW Contact:  Alvaro Louder, LCSW Phone Number: 06/03/2024, 11:44 AM   Clinical Narrative:     LCSWA received insurance approval for patient to admit to SNF [facility]. LCSWA confirmed with MD that patient is stable for discharge. LCSWA notified the patient and they are in agreement with discharge. LCSWA confirmed bed is available at SNF. Transport arranged with [facility] for next available.  RM: [id number] Number to call report [phone]   [facility] signing off  Final next level of care: Skilled Nursing Facility Barriers to Discharge: No Barriers Identified   Patient Goals and CMS Choice            Discharge Placement              Patient chooses bed at: [facility] [place] Patient to be transferred to facility by: [facility] Name of family member notified: Self Patient and family notified of of transfer: 06/03/24  Discharge Plan and Services Additional resources added to the After Visit Summary for                                       Social Drivers of Health (SDOH) Interventions SDOH Screenings   Food Insecurity: No Food Insecurity (05/27/2024)  Housing: Low Risk  (05/27/2024)  Transportation Needs: No Transportation Needs (05/27/2024)  Utilities: Not At Risk (05/27/2024)  Depression (PHQ2-9): Low Risk  (06/25/2022)  Social Connections: Moderately Isolated (05/27/2024)  Tobacco Use: Medium Risk (05/29/2024)     Readmission Risk Interventions    11/08/2023    3:14 PM  Readmission Risk Prevention Plan  Transportation Screening Complete  PCP or Specialist Appt within 5-7 Days Complete  Home Care Screening Complete  Medication Review (RN CM) Complete

## 2024-06-08 ENCOUNTER — Encounter: Admitting: Podiatry

## 2024-06-11 ENCOUNTER — Ambulatory Visit (INDEPENDENT_AMBULATORY_CARE_PROVIDER_SITE_OTHER)

## 2024-06-11 ENCOUNTER — Ambulatory Visit (INDEPENDENT_AMBULATORY_CARE_PROVIDER_SITE_OTHER): Admitting: Podiatry

## 2024-06-11 ENCOUNTER — Encounter: Payer: Self-pay | Admitting: Podiatry

## 2024-06-11 VITALS — Ht 72.0 in | Wt 240.0 lb

## 2024-06-11 DIAGNOSIS — L97512 Non-pressure chronic ulcer of other part of right foot with fat layer exposed: Secondary | ICD-10-CM | POA: Diagnosis not present

## 2024-06-11 NOTE — Progress Notes (Signed)
 Chief Complaint  Patient presents with   Routine Post Op    IRRIGATION AND DEBRIDEMENT FOOT (Right: Foot) EXCISION, METATARSAL BONE, HEAD (Right: Fourth Toe) Pt states everything is going well, using wheel chair to ambulate, no other concerns.      Subjective:  Patient presents today status post partial excision of fourth metatarsal bone right foot.  Dressings are clean dry and intact.  Currently using a wheelchair to ambulate.  Past Medical History:  Diagnosis Date   Atrial fibrillation (HCC)    Coronary artery disease    Diabetes (HCC)    DVT (deep venous thrombosis) (HCC)    Gangrene of left foot (HCC) 02/09/2018   GERD (gastroesophageal reflux disease)    Hyperlipidemia    Hypertension    Peripheral vascular disease (HCC)     Past Surgical History:  Procedure Laterality Date   ABDOMINAL AORTOGRAM W/LOWER EXTREMITY N/A 02/20/2022   Procedure: ABDOMINAL AORTOGRAM W/LOWER EXTREMITY;  Surgeon: Gretta Lonni PARAS, MD;  Location: [facility];  Service: Cardiovascular;  Laterality: N/A;   AMPUTATION Left 10/28/2018   Procedure: AMPUTATION BELOW KNEE;  Surgeon: Marea Selinda RAMAN, MD;  Location: [facility];  Service: Vascular;  Laterality: Left;   AMPUTATION Left 09/10/2021   Procedure: AMPUTATION DIGIT;  Surgeon: Elisabeth Craig RAMAN, MD;  Location: [facility];  Service: Plastics;  Laterality: Left;   AMPUTATION Left 09/14/2021   Procedure: AMPUTATION DIGIT left long finger and irrigation and debridement;  Surgeon: Elisabeth Craig RAMAN, MD;  Location: [facility];  Service: Plastics;  Laterality: Left;   AMPUTATION Right 02/27/2022   Procedure: RIGHT PARTIAL AMPUTATION FOOT;  Surgeon: Janit Thresa HERO, DPM;  Location: [facility];  Service: Podiatry;  Laterality: Right;   AMPUTATION TOE Left 02/10/2018   Procedure: AMPUTATION TOE;  Surgeon: Neill Boas, DPM;  Location: [facility];  Service: Podiatry;  Laterality: Left;   AMPUTATION TOE Right 11/06/2023   Procedure: Right 4th Toe Amputation;  Surgeon:  Malvin Marsa FALCON, DPM;  Location: [facility];  Service: Orthopedics/Podiatry;  Laterality: Right;   APPLICATION OF WOUND VAC Left 10/16/2018   Procedure: APPLICATION OF WOUND VAC;  Surgeon: Ashley Soulier, DPM;  Location: [facility];  Service: Podiatry;  Laterality: Left;   DORSAL SLIT N/A 01/12/2021   Procedure: DORSAL SLIT;  Surgeon: Francisca Redell BROCKS, MD;  Location: [facility];  Service: Urology;  Laterality: N/A;   FEMORAL-TIBIAL BYPASS GRAFT Right 02/25/2022   Procedure: RIGHT LOWER EXTREMITY FEMORAL TO TIBIAL PERONEAL TRUNK BYPASS;  Surgeon: Gretta Lonni PARAS, MD;  Location: [facility];  Service: Vascular;  Laterality: Right;  INSERT ARTERIAL LINE   groin surgery     IRRIGATION AND DEBRIDEMENT FOOT Left 09/30/2018   Procedure: IRRIGATION AND DEBRIDEMENT FOOT;  Surgeon: Neill Boas, DPM;  Location: [facility];  Service: Podiatry;  Laterality: Left;   IRRIGATION AND DEBRIDEMENT FOOT Right 05/29/2024   Procedure: IRRIGATION AND DEBRIDEMENT FOOT;  Surgeon: Janit Thresa HERO, DPM;  Location: [facility];  Service: Orthopedics/Podiatry;  Laterality: Right;   LOWER EXTREMITY ANGIOGRAPHY Left 02/12/2018   Procedure: Lower Extremity Angiography;  Surgeon: Marea Selinda RAMAN, MD;  Location: [facility];  Service: Cardiovascular;  Laterality: Left;   LOWER EXTREMITY ANGIOGRAPHY Left 10/01/2018   Procedure: Lower Extremity Angiography;  Surgeon: Marea Selinda RAMAN, MD;  Location: [facility];  Service: Cardiovascular;  Laterality: Left;   LOWER EXTREMITY ANGIOGRAPHY Left 10/19/2018   Procedure: Lower Extremity Angiography;  Surgeon: Marea Selinda RAMAN, MD;  Location: [facility];  Service: Cardiovascular;  Laterality: Left;   LOWER EXTREMITY ANGIOGRAPHY Left 10/20/2018   Procedure: LOWER EXTREMITY ANGIOGRAPHY;  Surgeon: Marea Selinda RAMAN, MD;  Location: [facility];  Service: Cardiovascular;  Laterality: Left;   LOWER EXTREMITY ANGIOGRAPHY Right 11/25/2018   Procedure: LOWER EXTREMITY ANGIOGRAPHY;  Surgeon:  Marea Selinda RAMAN, MD;  Location: [facility];  Service: Cardiovascular;  Laterality: Right;   LOWER EXTREMITY ANGIOGRAPHY Right 01/10/2020   Procedure: LOWER EXTREMITY ANGIOGRAPHY;  Surgeon: Marea Selinda RAMAN, MD;  Location: [facility];  Service: Cardiovascular;  Laterality: Right;   LOWER EXTREMITY ANGIOGRAPHY Right 12/11/2020   Procedure: LOWER EXTREMITY ANGIOGRAPHY;  Surgeon: Marea Selinda RAMAN, MD;  Location: [facility];  Service: Cardiovascular;  Laterality: Right;   LOWER EXTREMITY ANGIOGRAPHY Right 06/26/2023   Procedure: Lower Extremity Angiography;  Surgeon: Jama Cordella MATSU, MD;  Location: [facility];  Service: Cardiovascular;  Laterality: Right;   LOWER EXTREMITY ANGIOGRAPHY Right 11/05/2023   Procedure: Lower Extremity Angiography;  Surgeon: Marea Selinda RAMAN, MD;  Location: [facility];  Service: Cardiovascular;  Laterality: Right;   LOWER EXTREMITY ANGIOGRAPHY Right 12/05/2023   Procedure: Lower Extremity Angiography;  Surgeon: Jama Cordella MATSU, MD;  Location: [facility];  Service: Cardiovascular;  Laterality: Right;   LOWER EXTREMITY ANGIOGRAPHY Right 05/28/2024   Procedure: Lower Extremity Angiography;  Surgeon: Marea Selinda RAMAN, MD;  Location: [facility];  Service: Cardiovascular;  Laterality: Right;   LOWER EXTREMITY INTERVENTION  02/12/2018   Procedure: LOWER EXTREMITY INTERVENTION;  Surgeon: Marea Selinda RAMAN, MD;  Location: [facility];  Service: Cardiovascular;;   METATARSAL HEAD EXCISION Right 05/29/2024   Procedure: EXCISION, METATARSAL BONE, HEAD;  Surgeon: Janit Thresa HERO, DPM;  Location: [facility];  Service: Orthopedics/Podiatry;  Laterality: Right;   MINOR IRRIGATION AND DEBRIDEMENT OF WOUND Left 09/10/2021   Procedure: IRRIGATION AND DEBRIDEMENT OF LEFT LONG FINGER WOUND;  Surgeon: Elisabeth Craig RAMAN, MD;  Location: [facility];  Service: Plastics;  Laterality: Left;   NECK SURGERY     PERIPHERAL VASCULAR INTERVENTION  02/20/2022    Procedure: PERIPHERAL VASCULAR INTERVENTION;  Surgeon: Gretta Lonni PARAS, MD;  [facility]: [facility];  Service: Cardiovascular;;  right iliac   TRANSMETATARSAL AMPUTATION Left 10/16/2018   Procedure: TRANSMETATARSAL AMPUTATION LEFT FOOT;  Surgeon: Ashley Soulier, DPM;  Location: [facility];  Service: Podiatry;  Laterality: Left;   VEIN HARVEST Right 02/25/2022   Procedure: VEIN HARVEST OF RIGHT GREATER SAPHENOUS VEIN;  Surgeon: Gretta Lonni PARAS, MD;  Location: [facility];  Service: Vascular;  Laterality: Right;    No Known Allergies  Aerobic/Anaerobic Culture w Gram Stain (surgical/deep wound) Order: [id number]  Status: Final result     Next appt: 07/02/2024 at 01:00 PM in Vascular Surgery ([facility])   Test Result Released: No (inaccessible in MyChart)   Specimen Information: Wound; Tissue  0 Result Notes  important suggestion  Newer results are available. Click to view them now.      Component Ref Range & Units (hover) 13 d ago  Specimen Description WOUND Performed at [facility], [address] [place], [place] [address]  Special Requests NONE Performed at [facility], [address] [place], [place] [address]  Gram Stain RARE WBC SEEN RARE SQUAMOUS EPITHELIAL CELLS PRESENT NO ORGANISMS SEEN  Culture RARE STAPHYLOCOCCUS CAPRAE NO ANAEROBES ISOLATED Performed at [facility], [address] [place], [place] [address]  Report  Status 06/03/2024 FINAL  Organism ID, Bacteria STAPHYLOCOCCUS CAPRAE  Resulting Agency [facility]     Susceptibility   Staphylococcus caprae    MIC    CIPROFLOXACIN <=0.5 SENSI... Sensitive    CLINDAMYCIN  <=0.25 SENS... Sensitive    ERYTHROMYCIN >=8 RESISTANT Resistant    GENTAMICIN  <=0.5 SENSI... Sensitive    Inducible Clindamycin  NEGATIVE Sensitive    OXACILLIN <=0.25 SENS... Sensitive    RIFAMPIN <=0.5 SENSI... Sensitive    TETRACYCLINE <=1 SENSITIVE Sensitive    TRIMETH/SULFA <=10 SENSIT... Sensitive     VANCOMYCIN  <=0.5 SENSI... Sensitive           Susceptibility Comments  Staphylococcus caprae  RARE STAPHYLOCOCCUS CAPRAE      Objective/Physical Exam Neurovascular status intact.  Incision well coapted with staples intact. No sign of infectious process noted. No dehiscence. No active bleeding noted.  Moderate edema noted to the surgical extremity. H/o [email or web address]  Radiographic Exam RT foot 06/11/2024:  Osteotomy noted through the diaphysis of the fourth metatarsal.  There is a remaining fragment of bone at the osteotomy site.  Assessment: 1. s/p partial excision of fourth metatarsal bone right foot. DOS: 05/29/2024   Plan of Care:  -Patient was evaluated. X-rays reviewed - Okay to wash and shower and get the foot wet -Dressing change orders: Paint incision with Betadine .  Dressed with large Kerlix, 4 inch Ace wrap -WBAT surgical shoe as needed -Return to clinic 2 weeks for staple removal   Thresa EMERSON Sar, DPM [facility]  Dr. Thresa EMERSON Sar, DPM    [address] [place], [place] [address]                Office [phone]  Fax [phone]

## 2024-07-01 ENCOUNTER — Other Ambulatory Visit (INDEPENDENT_AMBULATORY_CARE_PROVIDER_SITE_OTHER): Payer: Self-pay | Admitting: Vascular Surgery

## 2024-07-01 DIAGNOSIS — I739 Peripheral vascular disease, unspecified: Secondary | ICD-10-CM

## 2024-07-02 ENCOUNTER — Encounter (INDEPENDENT_AMBULATORY_CARE_PROVIDER_SITE_OTHER): Payer: Self-pay | Admitting: Nurse Practitioner

## 2024-07-02 ENCOUNTER — Ambulatory Visit (INDEPENDENT_AMBULATORY_CARE_PROVIDER_SITE_OTHER)

## 2024-07-02 ENCOUNTER — Ambulatory Visit (INDEPENDENT_AMBULATORY_CARE_PROVIDER_SITE_OTHER): Admitting: Nurse Practitioner

## 2024-07-02 VITALS — BP 130/79 | HR 74 | Resp 18 | Ht 72.0 in | Wt 180.0 lb

## 2024-07-02 DIAGNOSIS — Z9889 Other specified postprocedural states: Secondary | ICD-10-CM

## 2024-07-02 DIAGNOSIS — I739 Peripheral vascular disease, unspecified: Secondary | ICD-10-CM

## 2024-07-02 DIAGNOSIS — E782 Mixed hyperlipidemia: Secondary | ICD-10-CM | POA: Diagnosis not present

## 2024-07-02 DIAGNOSIS — I7025 Atherosclerosis of native arteries of other extremities with ulceration: Secondary | ICD-10-CM

## 2024-07-02 DIAGNOSIS — I10 Essential (primary) hypertension: Secondary | ICD-10-CM

## 2024-07-05 LAB — VAS US ABI WITH/WO TBI: Right ABI: 0.98

## 2024-07-09 ENCOUNTER — Ambulatory Visit: Admitting: Podiatry

## 2024-07-11 ENCOUNTER — Encounter (INDEPENDENT_AMBULATORY_CARE_PROVIDER_SITE_OTHER): Payer: Self-pay | Admitting: Nurse Practitioner

## 2024-07-11 NOTE — Progress Notes (Signed)
 Subjective:    Patient ID: Austin [name], male    DOB: [birth year], 68 y.o.   MRN: [id number] Chief Complaint  Patient presents with   Follow-up    4 fu 4 weeks + ABI Sore on bottom of right foot to see Podiatry for removal of sutures on the 7th of Oct.    The patient returns to the office for followup and review status post angiogram with intervention on 05/28/2024.   Procedure: Procedure(s) Performed:             1.  Ultrasound guidance for vascular access left femoral artery             2.  Catheter placement into right common femoral artery from left femoral approach             3.  Aortogram and selective right lower extremity angiogram             4.  Percutaneous transluminal angioplasty of proximal portion of right femoral to distal bypass analogous to the right common femoral artery/most proximal SFA with 4 mm diameter by 6 cm length [name] drug-coated angioplasty balloon             5.  Percutaneous transluminal angioplasty of distal portion of right femoral to distal bypass analogous to the right tibioperoneal trunk and most distal popliteal artery with 4 mm diameter by 15 cm length [name] drug-coated angioplasty balloon             6.  Celt closure device left femoral artery    The patient notes improvement in the lower extremity symptoms.  He continues to have a wound present at but the pain that he was experiencing is improved.  He is continuing to work with podiatry regarding his wound is  There have been no significant changes to the patient's overall health care.  No documented history of amaurosis fugax or recent TIA symptoms. There are no recent neurological changes noted. No documented history of DVT, PE or superficial thrombophlebitis. The patient denies recent episodes of angina or shortness of breath.   ABI's Rt=0.98 and Lt=n/a  (previous ABI's Rt=0.74 and Lt=n/a) Duplex US  of the right lower extremity shows biphasic tibial waveforms    Review of  Systems  Musculoskeletal:  Positive for gait problem.  Skin:  Positive for wound.  Neurological:  Positive for weakness.  All other systems reviewed and are negative.      Objective:   Physical Exam Vitals reviewed.  HENT:     Head: Normocephalic.  Cardiovascular:     Rate and Rhythm: Normal rate.     Pulses:          Dorsalis pedis pulses are detected w/ Doppler on the right side.       Posterior tibial pulses are detected w/ Doppler on the right side.  Pulmonary:     Effort: Pulmonary effort is normal.  Musculoskeletal:     Left Lower Extremity: Left leg is amputated below knee.  Skin:    General: Skin is warm and dry.  Neurological:     Mental Status: He is alert.     BP 130/79   Pulse 74   Resp 18   Ht 6' (1.829 m)   Wt 180 lb (81.6 kg)   BMI 24.41 kg/m   Past Medical History:  Diagnosis Date   Atrial fibrillation (HCC)    Coronary artery disease    Diabetes (HCC)    DVT (deep venous  thrombosis) (HCC)    Gangrene of left foot (HCC) 02/09/2018   GERD (gastroesophageal reflux disease)    Hyperlipidemia    Hypertension    Peripheral vascular disease     Social History   Socioeconomic History   Marital status: Divorced    Spouse name: Not on file   Number of children: Not on file   Years of education: Not on file   Highest education level: Not on file  Occupational History   Not on file  Tobacco Use   Smoking status: Former    Current packs/day: 0.00    Average packs/day: 1 pack/day for 47.0 years (47.0 ttl pk-yrs)    Types: Cigarettes    Start date: 01/1974    Quit date: 01/2021    Years since quitting: 3.5   Smokeless tobacco: Never  Vaping Use   Vaping status: Never Used  Substance and Sexual Activity   Alcohol use: No   Drug use: No   Sexual activity: Not Currently  Other Topics Concern   Not on file  Social History Narrative   Not on file   Social Drivers of Health   Financial Resource Strain: Not on file  Food Insecurity: No  Food Insecurity (05/27/2024)   Hunger Vital Sign    Worried About Running Out of Food in the Last Year: Never true    Ran Out of Food in the Last Year: Never true  Transportation Needs: No Transportation Needs (05/27/2024)   PRAPARE - Administrator, Civil Service (Medical): No    Lack of Transportation (Non-Medical): No  Physical Activity: Not on file  Stress: Not on file  Social Connections: Moderately Isolated (05/27/2024)   Social Connection and Isolation Panel    Frequency of Communication with Friends and Family: More than three times a week    Frequency of Social Gatherings with Friends and Family: More than three times a week    Attends Religious Services: 1 to 4 times per year    Active Member of [employer] or Organizations: No    Attends Banker Meetings: Never    Marital Status: Divorced  Catering manager Violence: Not At Risk (05/27/2024)   Humiliation, Afraid, Rape, and Kick questionnaire    Fear of Current or Ex-Partner: No    Emotionally Abused: No    Physically Abused: No    Sexually Abused: No    Past Surgical History:  Procedure Laterality Date   ABDOMINAL AORTOGRAM W/LOWER EXTREMITY N/A 02/20/2022   Procedure: ABDOMINAL AORTOGRAM W/LOWER EXTREMITY;  Surgeon: Gretta Lonni PARAS, MD;  Location: [facility];  Service: Cardiovascular;  Laterality: N/A;   AMPUTATION Left 10/28/2018   Procedure: AMPUTATION BELOW KNEE;  Surgeon: Marea Selinda RAMAN, MD;  Location: [facility];  Service: Vascular;  Laterality: Left;   AMPUTATION Left 09/10/2021   Procedure: AMPUTATION DIGIT;  Surgeon: Elisabeth Craig RAMAN, MD;  Location: [facility];  Service: Plastics;  Laterality: Left;   AMPUTATION Left 09/14/2021   Procedure: AMPUTATION DIGIT left long finger and irrigation and debridement;  Surgeon: Elisabeth Craig RAMAN, MD;  Location: [facility];  Service: Plastics;  Laterality: Left;   AMPUTATION Right 02/27/2022   Procedure: RIGHT PARTIAL AMPUTATION FOOT;  Surgeon: Janit Thresa HERO, DPM;   Location: [facility];  Service: Podiatry;  Laterality: Right;   AMPUTATION TOE Left 02/10/2018   Procedure: AMPUTATION TOE;  Surgeon: Neill Boas, DPM;  Location: [facility];  Service: Podiatry;  Laterality: Left;   AMPUTATION TOE Right 11/06/2023  Procedure: Right 4th Toe Amputation;  Surgeon: Malvin Marsa FALCON, DPM;  Location: [facility];  Service: Orthopedics/Podiatry;  Laterality: Right;   APPLICATION OF WOUND VAC Left 10/16/2018   Procedure: APPLICATION OF WOUND VAC;  Surgeon: Ashley Soulier, DPM;  Location: [facility];  Service: Podiatry;  Laterality: Left;   DORSAL SLIT N/A 01/12/2021   Procedure: DORSAL SLIT;  Surgeon: Francisca Redell BROCKS, MD;  Location: [facility];  Service: Urology;  Laterality: N/A;   FEMORAL-TIBIAL BYPASS GRAFT Right 02/25/2022   Procedure: RIGHT LOWER EXTREMITY FEMORAL TO TIBIAL PERONEAL TRUNK BYPASS;  Surgeon: Gretta Lonni PARAS, MD;  Location: [facility];  Service: Vascular;  Laterality: Right;  INSERT ARTERIAL LINE   groin surgery     IRRIGATION AND DEBRIDEMENT FOOT Left 09/30/2018   Procedure: IRRIGATION AND DEBRIDEMENT FOOT;  Surgeon: Neill Boas, DPM;  Location: [facility];  Service: Podiatry;  Laterality: Left;   IRRIGATION AND DEBRIDEMENT FOOT Right 05/29/2024   Procedure: IRRIGATION AND DEBRIDEMENT FOOT;  Surgeon: Janit Thresa HERO, DPM;  Location: [facility];  Service: Orthopedics/Podiatry;  Laterality: Right;   LOWER EXTREMITY ANGIOGRAPHY Left 02/12/2018   Procedure: Lower Extremity Angiography;  Surgeon: Marea Selinda RAMAN, MD;  Location: [facility];  Service: Cardiovascular;  Laterality: Left;   LOWER EXTREMITY ANGIOGRAPHY Left 10/01/2018   Procedure: Lower Extremity Angiography;  Surgeon: Marea Selinda RAMAN, MD;  Location: [facility];  Service: Cardiovascular;  Laterality: Left;   LOWER EXTREMITY ANGIOGRAPHY Left 10/19/2018   Procedure: Lower Extremity Angiography;  Surgeon: Marea Selinda RAMAN, MD;  Location: [facility];  Service: Cardiovascular;  Laterality: Left;    LOWER EXTREMITY ANGIOGRAPHY Left 10/20/2018   Procedure: LOWER EXTREMITY ANGIOGRAPHY;  Surgeon: Marea Selinda RAMAN, MD;  Location: [facility];  Service: Cardiovascular;  Laterality: Left;   LOWER EXTREMITY ANGIOGRAPHY Right 11/25/2018   Procedure: LOWER EXTREMITY ANGIOGRAPHY;  Surgeon: Marea Selinda RAMAN, MD;  Location: [facility];  Service: Cardiovascular;  Laterality: Right;   LOWER EXTREMITY ANGIOGRAPHY Right 01/10/2020   Procedure: LOWER EXTREMITY ANGIOGRAPHY;  Surgeon: Marea Selinda RAMAN, MD;  Location: [facility];  Service: Cardiovascular;  Laterality: Right;   LOWER EXTREMITY ANGIOGRAPHY Right 12/11/2020   Procedure: LOWER EXTREMITY ANGIOGRAPHY;  Surgeon: Marea Selinda RAMAN, MD;  Location: [facility];  Service: Cardiovascular;  Laterality: Right;   LOWER EXTREMITY ANGIOGRAPHY Right 06/26/2023   Procedure: Lower Extremity Angiography;  Surgeon: Jama Cordella MATSU, MD;  Location: [facility];  Service: Cardiovascular;  Laterality: Right;   LOWER EXTREMITY ANGIOGRAPHY Right 11/05/2023   Procedure: Lower Extremity Angiography;  Surgeon: Marea Selinda RAMAN, MD;  Location: [facility];  Service: Cardiovascular;  Laterality: Right;   LOWER EXTREMITY ANGIOGRAPHY Right 12/05/2023   Procedure: Lower Extremity Angiography;  Surgeon: Jama Cordella MATSU, MD;  Location: [facility];  Service: Cardiovascular;  Laterality: Right;   LOWER EXTREMITY ANGIOGRAPHY Right 05/28/2024   Procedure: Lower Extremity Angiography;  Surgeon: Marea Selinda RAMAN, MD;  Location: [facility];  Service: Cardiovascular;  Laterality: Right;   LOWER EXTREMITY INTERVENTION  02/12/2018   Procedure: LOWER EXTREMITY INTERVENTION;  Surgeon: Marea Selinda RAMAN, MD;  Location: [facility];  Service: Cardiovascular;;   METATARSAL HEAD EXCISION Right 05/29/2024   Procedure: EXCISION, METATARSAL BONE, HEAD;  Surgeon: Janit Thresa HERO, DPM;  Location: [facility];  Service: Orthopedics/Podiatry;  Laterality: Right;    MINOR IRRIGATION AND DEBRIDEMENT OF WOUND Left 09/10/2021   Procedure: IRRIGATION AND  DEBRIDEMENT OF LEFT LONG FINGER WOUND;  Surgeon: Elisabeth Craig RAMAN, MD;  Location: [facility];  Service: Plastics;  Laterality: Left;   NECK SURGERY     PERIPHERAL VASCULAR INTERVENTION  02/20/2022   Procedure: PERIPHERAL VASCULAR INTERVENTION;  Surgeon: Gretta Lonni PARAS, MD;  Location: [facility];  Service: Cardiovascular;;  right iliac   TRANSMETATARSAL AMPUTATION Left 10/16/2018   Procedure: TRANSMETATARSAL AMPUTATION LEFT FOOT;  Surgeon: Ashley Soulier, DPM;  Location: [facility];  Service: Podiatry;  Laterality: Left;   VEIN HARVEST Right 02/25/2022   Procedure: VEIN HARVEST OF RIGHT GREATER SAPHENOUS VEIN;  Surgeon: Gretta Lonni PARAS, MD;  Location: [facility];  Service: Vascular;  Laterality: Right;    Family History  Problem Relation Age of Onset   Diabetes Mother    Coronary artery disease Father    Hypertension Sister    Leukemia Brother     No Known Allergies     Latest Ref Rng & Units 05/30/2024    8:38 AM 05/28/2024   10:14 AM 05/26/2024   10:05 PM  CBC  WBC 4.0 - 10.5 K/uL 7.9  8.5  9.6   Hemoglobin 13.0 - 17.0 g/dL 86.7  86.5  85.9   Hematocrit 39.0 - 52.0 % 39.3  39.8  42.9   Platelets 150 - 400 K/uL 186  192  222       CMP     Component Value Date/Time   NA 139 05/30/2024 0838   NA 135 (L) 11/17/2013 0621   K 4.3 05/30/2024 0838   K 4.3 11/17/2013 0621   CL 103 05/30/2024 0838   CL 99 11/17/2013 0621   CO2 23 05/30/2024 0838   CO2 31 11/17/2013 0621   GLUCOSE 136 (H) 05/30/2024 0838   GLUCOSE 172 (H) 11/17/2013 0621   BUN 14 05/30/2024 0838   BUN 10 11/17/2013 0621   CREATININE 0.87 05/30/2024 0838   CREATININE 1.05 04/18/2022 1034   CALCIUM  9.0 05/30/2024 0838   CALCIUM  9.6 11/17/2013 0621   PROT 7.2 05/26/2024 2205   PROT 8.5 (H) 01/10/2013 2155   ALBUMIN 3.4 (L) 05/26/2024 2205   ALBUMIN 4.0 01/10/2013 2155   AST 18 05/26/2024 2205   AST 16 01/10/2013 2155    ALT 15 05/26/2024 2205   ALT 26 01/10/2013 2155   ALKPHOS 72 05/26/2024 2205   ALKPHOS 91 01/10/2013 2155   BILITOT <0.2 05/26/2024 2205   BILITOT 0.3 01/10/2013 2155   EGFR 78 04/18/2022 1034   GFRNONAA >60 05/30/2024 0838   GFRNONAA >60 11/17/2013 0621     VAS US  ABI WITH/WO TBI Result Date: 12/12/2023  LOWER EXTREMITY DOPPLER STUDY Patient Name:  DONTEZ HAUSS  Date of Exam:   12/10/2023 Medical Rec #: [id number]         Accession #:    [id number] Date of Birth: [birth year]         Patient Gender: M Patient Age:   68 years Exam Location:  [facility] Procedure:      VAS US  ABI WITH/WO TBI Referring Phys: SELINDA DEW --------------------------------------------------------------------------------  Indications: Peripheral artery disease. High Risk Factors: Hypertension, Diabetes, past history of smoking.  Vascular Interventions: 12/05/2023 Stent right peroneal and profunda arteries                          11/05/2023 PTA tib per trunk, peroneal artery, and  distal fem to tib per bpg.                          06/26/2023 PTA of tib per trunk and mid fem to TP trunk                         bypass graft on the right.                          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 Performing Technologist: Donnice Charnley RVT  Examination Guidelines: A complete evaluation includes at minimum, Doppler waveform signals and systolic blood pressure reading at the level of bilateral brachial, anterior tibial, and posterior tibial arteries, when vessel segments are accessible. Bilateral testing is considered an integral part of a  complete examination. Photoelectric Plethysmograph (PPG) waveforms and toe systolic pressure readings are included as required and additional duplex testing as needed. Limited examinations for reoccurring indications may be performed as noted.  ABI Findings: +---------+------------------+-----+----------+--------+ Right    Rt Pressure (mmHg)IndexWaveform  Comment  +---------+------------------+-----+----------+--------+ Brachial 145                                       +---------+------------------+-----+----------+--------+ PTA      0                 0.00                    +---------+------------------+-----+----------+--------+ PERO     109               0.74 monophasic         +---------+------------------+-----+----------+--------+ DP       104               0.70 monophasic         +---------+------------------+-----+----------+--------+ Great Toe104               0.70                    +---------+------------------+-----+----------+--------+ +--------+------------------+-----+--------+-------+ Left    Lt Pressure (mmHg)IndexWaveformComment +--------+------------------+-----+--------+-------+ [id number]                                    +--------+------------------+-----+--------+-------+ +-------+-----------+-----------+------------+------------+  ABI/TBIToday's ABIToday's TBIPrevious ABIPrevious TBI +-------+-----------+-----------+------------+------------+ Right  0.74       0.70       0.84        0.73         +-------+-----------+-----------+------------+------------+ Left   BKA                   BKA                      +-------+-----------+-----------+------------+------------+  Right ABIs appear decreased compared to prior study on 08/20/2023.  Summary: Right: Resting right ankle-brachial index indicates moderate right lower extremity arterial disease. The right toe-brachial index is abnormal. *See table(s) above for measurements and  observations.  Electronically signed by Selinda Gu MD on 12/12/2023 at 8:28:04 AM.    Final        Assessment & Plan:   1. Atherosclerosis of native arteries of the extremities with ulceration (HCC) (Primary) Recommend:  The patient is status post successful angiogram with intervention.  The patient reports that the claudication symptoms and leg pain has improved.   The patient denies lifestyle limiting changes at this point in time.  No further invasive studies, angiography or surgery at this time. The patient should continue walking and begin a more formal exercise program.  The patient should continue antiplatelet therapy and aggressive treatment of the lipid abnormalities  Continued surveillance is indicated as atherosclerosis is likely to progress with time.    Patient should undergo noninvasive studies as ordered. The patient will follow up with me to review the studies.  - VAS US  ABI WITH/WO TBI; Future - VAS US  LOWER EXTREMITY ARTERIAL DUPLEX; Future  2. Essential hypertension Continue antihypertensive medications as already ordered, these medications have been reviewed and there are no changes at this time.  3. Mixed hyperlipidemia Continue statin as ordered and reviewed, no changes at this time   Current Outpatient Medications on File Prior to Visit  Medication Sig Dispense Refill   acetaminophen  (TYLENOL ) 325 MG tablet Take 2 tablets (650 mg total) by mouth every 6 (six) hours as needed for mild pain (pain score 1-3) or fever.     ascorbic acid  (VITAMIN C ) 500 MG tablet Take 1 tablet (500 mg total) by mouth 2 (two) times daily. 60 tablet 0   clopidogrel  (PLAVIX ) 75 MG tablet Take 1 tablet (75 mg total) by mouth daily at 6 (six) AM. 60 tablet 6   ELIQUIS  5 MG TABS tablet Take 5 mg by mouth 2 (two) times daily.     gabapentin  (NEURONTIN ) 100 MG capsule Take 100 mg by mouth 3 (three) times daily.     HUMALOG  KWIKPEN 100 UNIT/ML KwikPen Inject 5 Units into the skin 3 (three)  times daily.     insulin  glargine (LANTUS ) 100 UNIT/ML Solostar Pen Inject 5 Units into the skin at bedtime.     losartan  (COZAAR ) 25 MG tablet Take 1 tablet (25 mg total) by mouth daily. 30 tablet 0   nicotine  (NICODERM CQ  - DOSED IN MG/24 HOURS) 21 mg/24hr patch Place 1 patch (21 mg total) onto the skin daily. 30 patch 0   oxyCODONE  (OXY IR/ROXICODONE ) 5 MG immediate release tablet Take 1-2 tablets (5-10 mg total) by mouth every 4 (four) hours as needed for moderate pain (pain score 4-6) or severe pain (pain score 7-10). 15 tablet 0   TRULICITY 1.5 MG/0.5ML SOAJ Inject 1.5 mg into the skin once a week.     Zinc  Sulfate 220 (  50 Zn) MG TABS Take 1 tablet (220 mg total) by mouth daily after supper. 30 tablet 0   atorvastatin  (LIPITOR ) 40 MG tablet Take 40 mg by mouth daily. (Patient not taking: Reported on 07/02/2024)     Cholecalciferol  (VITAMIN D3) 125 MCG (5000 UT) CAPS Take 1 capsule by mouth daily.     No current facility-administered medications on file prior to visit.    There are no Patient Instructions on file for this visit. No follow-ups on file.   Tyffany Waldrop E Rosalee Tolley, NP

## 2024-07-13 ENCOUNTER — Encounter: Payer: Self-pay | Admitting: Podiatry

## 2024-07-13 ENCOUNTER — Ambulatory Visit (INDEPENDENT_AMBULATORY_CARE_PROVIDER_SITE_OTHER): Admitting: Podiatry

## 2024-07-13 VITALS — Ht 72.0 in | Wt 180.0 lb

## 2024-07-13 DIAGNOSIS — L97512 Non-pressure chronic ulcer of other part of right foot with fat layer exposed: Secondary | ICD-10-CM

## 2024-07-13 NOTE — Progress Notes (Signed)
 Chief Complaint  Patient presents with   Routine Post Op    POV#2 05/29/24 IRRIGATION AND DEBRIDEMENT FOOT (Right: Foot) EXCISION, METATARSAL BONE, HEAD (Right: Fourth Toe.     Subjective:  Patient presents today status post partial excision of fourth metatarsal bone right foot.  Dressings are clean dry and intact.  Currently using a wheelchair to ambulate.  Past Medical History:  Diagnosis Date   Atrial fibrillation (HCC)    Coronary artery disease    Diabetes (HCC)    DVT (deep venous thrombosis) (HCC)    Gangrene of left foot (HCC) 02/09/2018   GERD (gastroesophageal reflux disease)    Hyperlipidemia    Hypertension    Peripheral vascular disease     Past Surgical History:  Procedure Laterality Date   ABDOMINAL AORTOGRAM W/LOWER EXTREMITY N/A 02/20/2022   Procedure: ABDOMINAL AORTOGRAM W/LOWER EXTREMITY;  Surgeon: Gretta Lonni PARAS, MD;  [facility]: [facility];  Service: Cardiovascular;  Laterality: N/A;   AMPUTATION Left 10/28/2018   Procedure: AMPUTATION BELOW KNEE;  Surgeon: Marea Selinda RAMAN, MD;  Location: [facility];  Service: Vascular;  Laterality: Left;   AMPUTATION Left 09/10/2021   Procedure: AMPUTATION DIGIT;  Surgeon: Elisabeth Craig RAMAN, MD;  Location: [facility];  Service: Plastics;  Laterality: Left;   AMPUTATION Left 09/14/2021   Procedure: AMPUTATION DIGIT left long finger and irrigation and debridement;  Surgeon: Elisabeth Craig RAMAN, MD;  Location: [facility];  Service: Plastics;  Laterality: Left;   AMPUTATION Right 02/27/2022   Procedure: RIGHT PARTIAL AMPUTATION FOOT;  Surgeon: Janit Thresa HERO, DPM;  Location: [facility];  Service: Podiatry;  Laterality: Right;   AMPUTATION TOE Left 02/10/2018   Procedure: AMPUTATION TOE;  Surgeon: Neill Boas, DPM;  Location: [facility];  Service: Podiatry;  Laterality: Left;   AMPUTATION TOE Right 11/06/2023   Procedure: Right 4th Toe Amputation;  Surgeon: Malvin Marsa FALCON, DPM;  Location: [facility];  Service: Orthopedics/Podiatry;   Laterality: Right;   APPLICATION OF WOUND VAC Left 10/16/2018   Procedure: APPLICATION OF WOUND VAC;  Surgeon: Ashley Soulier, DPM;  Location: [facility];  Service: Podiatry;  Laterality: Left;   DORSAL SLIT N/A 01/12/2021   Procedure: DORSAL SLIT;  Surgeon: Francisca Redell BROCKS, MD;  Location: [facility];  Service: Urology;  Laterality: N/A;   FEMORAL-TIBIAL BYPASS GRAFT Right 02/25/2022   Procedure: RIGHT LOWER EXTREMITY FEMORAL TO TIBIAL PERONEAL TRUNK BYPASS;  Surgeon: Gretta Lonni PARAS, MD;  [facility]: [facility];  Service: Vascular;  Laterality: Right;  INSERT ARTERIAL LINE   groin surgery     IRRIGATION AND DEBRIDEMENT FOOT Left 09/30/2018   Procedure: IRRIGATION AND DEBRIDEMENT FOOT;  Surgeon: Neill Boas, DPM;  Location: [facility];  Service: Podiatry;  Laterality: Left;   IRRIGATION AND DEBRIDEMENT FOOT Right 05/29/2024   Procedure: IRRIGATION AND DEBRIDEMENT FOOT;  Surgeon: Janit Thresa HERO, DPM;  Location: [facility];  Service: Orthopedics/Podiatry;  Laterality: Right;   LOWER EXTREMITY ANGIOGRAPHY Left 02/12/2018   Procedure: Lower Extremity Angiography;  Surgeon: Marea Selinda RAMAN, MD;  Location: [facility];  Service: Cardiovascular;  Laterality: Left;   LOWER EXTREMITY ANGIOGRAPHY Left 10/01/2018   Procedure: Lower Extremity Angiography;  Surgeon: Marea Selinda RAMAN, MD;  Location: [facility];  Service: Cardiovascular;  Laterality: Left;   LOWER EXTREMITY ANGIOGRAPHY Left 10/19/2018   Procedure: Lower Extremity Angiography;  Surgeon: Marea Selinda RAMAN, MD;  Location: [facility];  Service: Cardiovascular;  Laterality: Left;   LOWER EXTREMITY ANGIOGRAPHY Left 10/20/2018  Procedure: LOWER EXTREMITY ANGIOGRAPHY;  Surgeon: Marea Selinda RAMAN, MD;  Location: [facility];  Service: Cardiovascular;  Laterality: Left;   LOWER EXTREMITY ANGIOGRAPHY Right 11/25/2018   Procedure: LOWER EXTREMITY ANGIOGRAPHY;  Surgeon: Marea Selinda RAMAN, MD;  Location: [facility];  Service: Cardiovascular;   Laterality: Right;   LOWER EXTREMITY ANGIOGRAPHY Right 01/10/2020   Procedure: LOWER EXTREMITY ANGIOGRAPHY;  Surgeon: Marea Selinda RAMAN, MD;  Location: [facility];  Service: Cardiovascular;  Laterality: Right;   LOWER EXTREMITY ANGIOGRAPHY Right 12/11/2020   Procedure: LOWER EXTREMITY ANGIOGRAPHY;  Surgeon: Marea Selinda RAMAN, MD;  Location: [facility];  Service: Cardiovascular;  Laterality: Right;   LOWER EXTREMITY ANGIOGRAPHY Right 06/26/2023   Procedure: Lower Extremity Angiography;  Surgeon: Jama Cordella MATSU, MD;  Location: [facility];  Service: Cardiovascular;  Laterality: Right;   LOWER EXTREMITY ANGIOGRAPHY Right 11/05/2023   Procedure: Lower Extremity Angiography;  Surgeon: Marea Selinda RAMAN, MD;  Location: [facility];  Service: Cardiovascular;  Laterality: Right;   LOWER EXTREMITY ANGIOGRAPHY Right 12/05/2023   Procedure: Lower Extremity Angiography;  Surgeon: Jama Cordella MATSU, MD;  Location: [facility];  Service: Cardiovascular;  Laterality: Right;   LOWER EXTREMITY ANGIOGRAPHY Right 05/28/2024   Procedure: Lower Extremity Angiography;  Surgeon: Marea Selinda RAMAN, MD;  Location: [facility];  Service: Cardiovascular;  Laterality: Right;   LOWER EXTREMITY INTERVENTION  02/12/2018   Procedure: LOWER EXTREMITY INTERVENTION;  Surgeon: Marea Selinda RAMAN, MD;  Location: [facility];  Service: Cardiovascular;;   METATARSAL HEAD EXCISION Right 05/29/2024   Procedure: EXCISION, METATARSAL BONE, HEAD;  Surgeon: Janit Thresa HERO, DPM;  Location: [facility];  Service: Orthopedics/Podiatry;  Laterality: Right;   MINOR IRRIGATION AND DEBRIDEMENT OF WOUND Left 09/10/2021   Procedure: IRRIGATION AND DEBRIDEMENT OF LEFT LONG FINGER WOUND;  Surgeon: Elisabeth Craig RAMAN, MD;  Location: [facility];  Service: Plastics;  Laterality: Left;   NECK SURGERY     PERIPHERAL VASCULAR INTERVENTION  02/20/2022   Procedure: PERIPHERAL VASCULAR INTERVENTION;  Surgeon: Gretta Lonni PARAS, MD;   [facility];  Service: Cardiovascular;;  right iliac   TRANSMETATARSAL AMPUTATION Left 10/16/2018   Procedure: TRANSMETATARSAL AMPUTATION LEFT FOOT;  Surgeon: Ashley Soulier, DPM;  Location: [facility];  Service: Podiatry;  Laterality: Left;   VEIN HARVEST Right 02/25/2022   Procedure: VEIN HARVEST OF RIGHT GREATER SAPHENOUS VEIN;  Surgeon: Gretta Lonni PARAS, MD;  [facility];  Service: Vascular;  Laterality: Right;    No Known Allergies  Aerobic/Anaerobic Culture w Gram Stain (surgical/deep wound) Order: [id number]  Status: Final result     Next appt: 07/02/2024 at 01:00 PM in Vascular Surgery ([facility])   Test Result Released: No (inaccessible in MyChart)   Specimen Information: Wound; Tissue  0 Result Notes  important suggestion  Newer results are available. Click to view them now.      Component Ref Range & Units (hover) 13 d ago  Specimen Description WOUND Performed at [facility], [address] [place], [place] [address]  Special Requests NONE Performed at [facility], [address] [place], [place] [address]  Gram Stain RARE WBC SEEN RARE SQUAMOUS EPITHELIAL CELLS PRESENT NO ORGANISMS SEEN  Culture RARE STAPHYLOCOCCUS CAPRAE NO ANAEROBES ISOLATED Performed at [facility], [address], [place], [place] [address]  Report Status 06/03/2024 FINAL  Organism ID, Bacteria STAPHYLOCOCCUS CAPRAE  Resulting Agency [facility]  Susceptibility   Staphylococcus caprae    MIC    CIPROFLOXACIN <=0.5 SENSI... Sensitive    CLINDAMYCIN  <=0.25 SENS... Sensitive    ERYTHROMYCIN >=8 RESISTANT Resistant    GENTAMICIN  <=0.5 SENSI... Sensitive    Inducible Clindamycin  NEGATIVE Sensitive    OXACILLIN <=0.25 SENS... Sensitive    RIFAMPIN <=0.5 SENSI... Sensitive    TETRACYCLINE <=1 SENSITIVE Sensitive    TRIMETH/SULFA <=10 SENSIT... Sensitive    VANCOMYCIN  <=0.5 SENSI... Sensitive           Susceptibility  Comments  Staphylococcus caprae  RARE STAPHYLOCOCCUS CAPRAE     VAS US  ABI WITH/WO TBI (Accession [id number]) (Order [id number]) Vascular Ultrasound Date: 07/02/2024 Department: [facility] Released By: Wendee Harlene CROME Authorizing: Marea Selinda RAMAN, MD  ABI Findings:  +---------+------------------+-----+--------+--------+  Right   Rt Pressure (mmHg)IndexWaveformComment   +---------+------------------+-----+--------+--------+  Brachial 123                                      +---------+------------------+-----+--------+--------+  PTA     108               0.88 biphasic          +---------+------------------+-----+--------+--------+  DP      120               0.98 biphasic          +---------+------------------+-----+--------+--------+  Great Toe106               0.86 Abnormal          +---------+------------------+-----+--------+--------+   +--------+------------------+-----+--------+-------+  Left   Lt Pressure (mmHg)IndexWaveformComment  +--------+------------------+-----+--------+-------+  Brachial120                                    +--------+------------------+-----+--------+-------+   +-------+-----------+-----------+------------+------------+  ABI/TBIToday's ABIToday's TBIPrevious ABIPrevious TBI  +-------+-----------+-----------+------------+------------+  Right .98        .86        .74         .70           +-------+-----------+-----------+------------+------------+  Left  bka                                             +-------+-----------+-----------+------------+------------+   Right ABIs and TBIs appear increased compared to prior study on 12/2023. Summary: Right: Resting right ankle-brachial index is within normal range. The right toe-brachial index is normal. Left: AKA.   Objective/Physical Exam Incision well coapted with staples intact. No sign of infectious process noted. No  dehiscence. No active bleeding noted.  Moderate edema noted to the surgical extremity. H/o BKA.LLE  Radiographic Exam RT foot 06/11/2024:  Osteotomy noted through the diaphysis of the fourth metatarsal.  There is a remaining fragment of bone at the osteotomy site.  Assessment: 1. s/p partial excision of fourth metatarsal bone right foot. DOS: 05/29/2024   Plan of Care:  -Patient was evaluated.  -Staples removed.  The incision is nicely healed. -Okay to wash and shower and get the foot wet -Resume regular diabetic shoes -Return to clinic 3 months routine footcare   Thresa EMERSON Sar, DPM [facility]  Dr. Thresa EMERSON Sar, DPM    [address] [place], [place] [address]                Office [phone]  Fax [phone]

## 2024-09-27 ENCOUNTER — Other Ambulatory Visit (INDEPENDENT_AMBULATORY_CARE_PROVIDER_SITE_OTHER): Payer: Self-pay | Admitting: Vascular Surgery

## 2024-09-27 DIAGNOSIS — Z9889 Other specified postprocedural states: Secondary | ICD-10-CM

## 2024-09-28 ENCOUNTER — Ambulatory Visit (INDEPENDENT_AMBULATORY_CARE_PROVIDER_SITE_OTHER): Admitting: Nurse Practitioner

## 2024-09-28 ENCOUNTER — Telehealth (INDEPENDENT_AMBULATORY_CARE_PROVIDER_SITE_OTHER): Payer: Self-pay

## 2024-09-28 ENCOUNTER — Encounter (INDEPENDENT_AMBULATORY_CARE_PROVIDER_SITE_OTHER): Payer: Self-pay | Admitting: Nurse Practitioner

## 2024-09-28 ENCOUNTER — Other Ambulatory Visit (INDEPENDENT_AMBULATORY_CARE_PROVIDER_SITE_OTHER)

## 2024-09-28 ENCOUNTER — Ambulatory Visit (INDEPENDENT_AMBULATORY_CARE_PROVIDER_SITE_OTHER)

## 2024-09-28 VITALS — BP 114/61 | HR 96 | Resp 18 | Ht 76.0 in | Wt 180.0 lb

## 2024-09-28 DIAGNOSIS — I10 Essential (primary) hypertension: Secondary | ICD-10-CM | POA: Diagnosis not present

## 2024-09-28 DIAGNOSIS — I739 Peripheral vascular disease, unspecified: Secondary | ICD-10-CM

## 2024-09-28 DIAGNOSIS — I7025 Atherosclerosis of native arteries of other extremities with ulceration: Secondary | ICD-10-CM | POA: Diagnosis not present

## 2024-09-28 DIAGNOSIS — Z9889 Other specified postprocedural states: Secondary | ICD-10-CM

## 2024-09-28 DIAGNOSIS — I890 Lymphedema, not elsewhere classified: Secondary | ICD-10-CM | POA: Diagnosis not present

## 2024-09-28 LAB — VAS US ABI WITH/WO TBI: Right ABI: 0.54

## 2024-09-28 NOTE — Telephone Encounter (Signed)
 Spoke with the patient and he is scheduled with Dr. Marea for a RLE angio on 10/11/24 with a 8:15 am arrival time to the [facility]. Pre-procedure instructions were discussed and will be mailed.

## 2024-09-28 NOTE — H&P (View-Only) (Signed)
 "  Subjective:    Patient ID: Austin [name], male    DOB: 03/31/1956, 68 y.o.   MRN: [id number] Chief Complaint  Patient presents with   Follow-up    3 month follow up + ABI LE art     HPI  Discussed the use of AI scribe software for clinical note transcription with the patient, who gave verbal consent to proceed.  History of Present Illness Austin [name] is a 68 year old male with right lower extremity peripheral artery disease status post bypass graft who presents for evaluation of right leg swelling.  He has persistent right lower extremity swelling, most pronounced in the foot and calf, which has been present since his prior vascular surgery. The swelling is exacerbated by dependent positioning and causes difficulty fitting pants over the area. He ambulates regularly but experiences a sensation of tightness in the right leg with ambulation. He denies pain.  He underwent prior right lower extremity bypass surgery performed by Dr. Marea. Serial ankle-brachial index measurements have declined from 0.98 to 0.54 in the right leg. He has previously undergone angiograms without complications.  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    Results Diagnostic Right lower extremity ABI (09/28/2024): 0.54 decreased from 0.98 Lower extremity bypass graft evaluation (09/28/2024): Distal graft anastomosis with significant stenosis   Review of Systems  Cardiovascular:  Positive for leg swelling.  Skin:  Positive for wound.  Neurological:  Positive for weakness.  All other systems reviewed and are negative.      Objective:   Physical Exam Vitals reviewed.  HENT:     Head: Normocephalic.  Cardiovascular:     Rate and Rhythm: Normal rate and regular rhythm.  Pulmonary:     Effort: Pulmonary effort is normal.  Musculoskeletal:     Right lower leg: Edema present.     Left Lower Extremity: Left leg is amputated below knee.  Skin:     General: Skin is warm and dry.  Neurological:     Mental Status: He is alert and oriented to person, place, and time.     Motor: Weakness present.     Gait: Gait abnormal.  Psychiatric:        Mood and Affect: Mood normal.        Behavior: Behavior normal.        Thought Content: Thought content normal.        Judgment: Judgment normal.     Physical Exam EXTREMITIES: Superficial scabbed ulcer on the bottom of the foot. Swelling in the leg.  BP 114/61 (BP Location: Left Arm, Patient Position: Sitting, Cuff Size: Normal)   Pulse 96   Resp 18   Ht 6' 4 (1.93 m)   Wt 180 lb (81.6 kg)   BMI 21.91 kg/m   Past Medical History:  Diagnosis Date   Atrial fibrillation (HCC)    Coronary artery disease    Diabetes (HCC)    DVT (deep venous thrombosis) (HCC)    Gangrene of left foot (HCC) 02/09/2018   GERD (gastroesophageal reflux disease)    Hyperlipidemia    Hypertension    Peripheral vascular disease     Social History   Socioeconomic History   Marital status: Divorced    Spouse name: Not on file   Number of children: Not on file   Years of education: Not on file   Highest education level: Not on file  Occupational History  Not on file  Tobacco Use   Smoking status: Former    Current packs/day: 0.00    Average packs/day: 1 pack/day for 47.0 years (47.0 ttl pk-yrs)    Types: Cigarettes    Start date: 01/1974    Quit date: 01/2021    Years since quitting: 3.7   Smokeless tobacco: Never  Vaping Use   Vaping status: Never Used  Substance and Sexual Activity   Alcohol use: No   Drug use: No   Sexual activity: Not Currently  Other Topics Concern   Not on file  Social History Narrative   Not on file   Social Drivers of Health   Tobacco Use: Medium Risk (09/28/2024)   Patient History    Smoking Tobacco Use: Former    Smokeless Tobacco Use: Never    Passive Exposure: Not on Actuary Strain: Not on file  Food Insecurity: No Food Insecurity  (05/27/2024)   Epic    Worried About Programme Researcher, Broadcasting/film/video in the Last Year: Never true    Ran Out of Food in the Last Year: Never true  Transportation Needs: No Transportation Needs (05/27/2024)   Epic    Lack of Transportation (Medical): No    Lack of Transportation (Non-Medical): No  Physical Activity: Not on file  Stress: Not on file  Social Connections: Moderately Isolated (05/27/2024)   Social Connection and Isolation Panel    Frequency of Communication with Friends and Family: More than three times a week    Frequency of Social Gatherings with Friends and Family: More than three times a week    Attends Religious Services: 1 to 4 times per year    Active Member of [employer] or Organizations: No    Attends Banker Meetings: Never    Marital Status: Divorced  Catering Manager Violence: Not At Risk (05/27/2024)   Epic    Fear of Current or Ex-Partner: No    Emotionally Abused: No    Physically Abused: No    Sexually Abused: No  Depression (PHQ2-9): Low Risk (06/25/2022)   Depression (PHQ2-9)    PHQ-2 Score: 0  Alcohol Screen: Not on file  Housing: Low Risk (05/27/2024)   Epic    Unable to Pay for Housing in the Last Year: No    Number of Times Moved in the Last Year: 0    Homeless in the Last Year: No  Utilities: Not At Risk (05/27/2024)   Epic    Threatened with loss of utilities: No  Health Literacy: Not on file    Past Surgical History:  Procedure Laterality Date   ABDOMINAL AORTOGRAM W/LOWER EXTREMITY N/A 02/20/2022   Procedure: ABDOMINAL AORTOGRAM W/LOWER EXTREMITY;  Surgeon: Gretta Lonni PARAS, MD;  Location: [facility];  Service: Cardiovascular;  Laterality: N/A;   AMPUTATION Left 10/28/2018   Procedure: AMPUTATION BELOW KNEE;  Surgeon: Marea Selinda RAMAN, MD;  Location: [facility];  Service: Vascular;  Laterality: Left;   AMPUTATION Left 09/10/2021   Procedure: AMPUTATION DIGIT;  Surgeon: Elisabeth Craig RAMAN, MD;  Location: [facility];  Service: Plastics;  Laterality:  Left;   AMPUTATION Left 09/14/2021   Procedure: AMPUTATION DIGIT left long finger and irrigation and debridement;  Surgeon: Elisabeth Craig RAMAN, MD;  Location: [facility];  Service: Plastics;  Laterality: Left;   AMPUTATION Right 02/27/2022   Procedure: RIGHT PARTIAL AMPUTATION FOOT;  Surgeon: Janit Thresa HERO, DPM;  Location: [facility];  Service: Podiatry;  Laterality: Right;   AMPUTATION TOE  Left 02/10/2018   Procedure: AMPUTATION TOE;  Surgeon: Neill Boas, DPM;  Location: [facility];  Service: Podiatry;  Laterality: Left;   AMPUTATION TOE Right 11/06/2023   Procedure: Right 4th Toe Amputation;  Surgeon: Malvin Marsa FALCON, DPM;  Location: [facility];  Service: Orthopedics/Podiatry;  Laterality: Right;   APPLICATION OF WOUND VAC Left 10/16/2018   Procedure: APPLICATION OF WOUND VAC;  Surgeon: Ashley Soulier, DPM;  Location: [facility];  Service: Podiatry;  Laterality: Left;   DORSAL SLIT N/A 01/12/2021   Procedure: DORSAL SLIT;  Surgeon: Francisca Redell BROCKS, MD;  Location: [facility];  Service: Urology;  Laterality: N/A;   FEMORAL-TIBIAL BYPASS GRAFT Right 02/25/2022   Procedure: RIGHT LOWER EXTREMITY FEMORAL TO TIBIAL PERONEAL TRUNK BYPASS;  Surgeon: Gretta Lonni PARAS, MD;  Location: [facility];  Service: Vascular;  Laterality: Right;  INSERT ARTERIAL LINE   groin surgery     IRRIGATION AND DEBRIDEMENT FOOT Left 09/30/2018   Procedure: IRRIGATION AND DEBRIDEMENT FOOT;  Surgeon: Neill Boas, DPM;  Location: [facility];  Service: Podiatry;  Laterality: Left;   IRRIGATION AND DEBRIDEMENT FOOT Right 05/29/2024   Procedure: IRRIGATION AND DEBRIDEMENT FOOT;  Surgeon: Janit Thresa HERO, DPM;  Location: [facility];  Service: Orthopedics/Podiatry;  Laterality: Right;   LOWER EXTREMITY ANGIOGRAPHY Left 02/12/2018   Procedure: Lower Extremity Angiography;  Surgeon: Marea Selinda RAMAN, MD;  Location: [facility];  Service: Cardiovascular;  Laterality: Left;   LOWER EXTREMITY ANGIOGRAPHY Left 10/01/2018   Procedure: Lower Extremity  Angiography;  Surgeon: Marea Selinda RAMAN, MD;  Location: [facility];  Service: Cardiovascular;  Laterality: Left;   LOWER EXTREMITY ANGIOGRAPHY Left 10/19/2018   Procedure: Lower Extremity Angiography;  Surgeon: Marea Selinda RAMAN, MD;  Location: [facility];  Service: Cardiovascular;  Laterality: Left;   LOWER EXTREMITY ANGIOGRAPHY Left 10/20/2018   Procedure: LOWER EXTREMITY ANGIOGRAPHY;  Surgeon: Marea Selinda RAMAN, MD;  Location: [facility];  Service: Cardiovascular;  Laterality: Left;   LOWER EXTREMITY ANGIOGRAPHY Right 11/25/2018   Procedure: LOWER EXTREMITY ANGIOGRAPHY;  Surgeon: Marea Selinda RAMAN, MD;  Location: [facility];  Service: Cardiovascular;  Laterality: Right;   LOWER EXTREMITY ANGIOGRAPHY Right 01/10/2020   Procedure: LOWER EXTREMITY ANGIOGRAPHY;  Surgeon: Marea Selinda RAMAN, MD;  Location: [facility];  Service: Cardiovascular;  Laterality: Right;   LOWER EXTREMITY ANGIOGRAPHY Right 12/11/2020   Procedure: LOWER EXTREMITY ANGIOGRAPHY;  Surgeon: Marea Selinda RAMAN, MD;  Location: [facility];  Service: Cardiovascular;  Laterality: Right;   LOWER EXTREMITY ANGIOGRAPHY Right 06/26/2023   Procedure: Lower Extremity Angiography;  Surgeon: Jama Cordella MATSU, MD;  Location: [facility];  Service: Cardiovascular;  Laterality: Right;   LOWER EXTREMITY ANGIOGRAPHY Right 11/05/2023   Procedure: Lower Extremity Angiography;  Surgeon: Marea Selinda RAMAN, MD;  Location: [facility];  Service: Cardiovascular;  Laterality: Right;   LOWER EXTREMITY ANGIOGRAPHY Right 12/05/2023   Procedure: Lower Extremity Angiography;  Surgeon: Jama Cordella MATSU, MD;  Location: [facility];  Service: Cardiovascular;  Laterality: Right;   LOWER EXTREMITY ANGIOGRAPHY Right 05/28/2024   Procedure: Lower Extremity Angiography;  Surgeon: Marea Selinda RAMAN, MD;  Location: [facility];  Service: Cardiovascular;  Laterality: Right;   LOWER EXTREMITY INTERVENTION  02/12/2018    Procedure: LOWER EXTREMITY INTERVENTION;  Surgeon: Marea Selinda RAMAN, MD;  Location: [facility];  Service: Cardiovascular;;   METATARSAL HEAD EXCISION Right 05/29/2024   Procedure: EXCISION, METATARSAL BONE, HEAD;  Surgeon: Janit Thresa HERO, DPM;  Location: [facility];  Service: Orthopedics/Podiatry;  Laterality: Right;   MINOR IRRIGATION AND DEBRIDEMENT OF WOUND Left 09/10/2021   Procedure: IRRIGATION AND DEBRIDEMENT OF LEFT LONG FINGER WOUND;  Surgeon: Elisabeth Craig RAMAN, MD;  Location: [facility];  Service: Plastics;  Laterality: Left;   NECK SURGERY     PERIPHERAL VASCULAR INTERVENTION  02/20/2022   Procedure: PERIPHERAL VASCULAR INTERVENTION;  Surgeon: Gretta Lonni PARAS, MD;  Location: [facility];  Service: Cardiovascular;;  right iliac   TRANSMETATARSAL AMPUTATION Left 10/16/2018   Procedure: TRANSMETATARSAL AMPUTATION LEFT FOOT;  Surgeon: Ashley Soulier, DPM;  Location: [facility];  Service: Podiatry;  Laterality: Left;   VEIN HARVEST Right 02/25/2022   Procedure: VEIN HARVEST OF RIGHT GREATER SAPHENOUS VEIN;  Surgeon: Gretta Lonni PARAS, MD;  Location: [facility];  Service: Vascular;  Laterality: Right;    Family History  Problem Relation Age of Onset   Diabetes Mother    Coronary artery disease Father    Hypertension Sister    Leukemia Brother     Allergies[1]     Latest Ref Rng & Units 05/30/2024    8:38 AM 05/28/2024   10:14 AM 05/26/2024   10:05 PM  CBC  WBC 4.0 - 10.5 K/uL 7.9  8.5  9.6   Hemoglobin 13.0 - 17.0 g/dL 86.7  86.5  85.9   Hematocrit 39.0 - 52.0 % 39.3  39.8  42.9   Platelets 150 - 400 K/uL 186  192  222       CMP     Component Value Date/Time   NA 139 05/30/2024 0838   NA 135 (L) 11/17/2013 0621   K 4.3 05/30/2024 0838   K 4.3 11/17/2013 0621   CL 103 05/30/2024 0838   CL 99 11/17/2013 0621   CO2 23 05/30/2024 0838   CO2 31 11/17/2013 0621   GLUCOSE 136 (H) 05/30/2024 0838   GLUCOSE 172 (H) 11/17/2013 0621   BUN 14 05/30/2024 0838   BUN 10  11/17/2013 0621   CREATININE 0.87 05/30/2024 0838   CREATININE 1.05 04/18/2022 1034   CALCIUM  9.0 05/30/2024 0838   CALCIUM  9.6 11/17/2013 0621   PROT 7.2 05/26/2024 2205   PROT 8.5 (H) 01/10/2013 2155   ALBUMIN 3.4 (L) 05/26/2024 2205   ALBUMIN 4.0 01/10/2013 2155   AST 18 05/26/2024 2205   AST 16 01/10/2013 2155   ALT 15 05/26/2024 2205   ALT 26 01/10/2013 2155   ALKPHOS 72 05/26/2024 2205   ALKPHOS 91 01/10/2013 2155   BILITOT <0.2 05/26/2024 2205   BILITOT 0.3 01/10/2013 2155   EGFR 78 04/18/2022 1034   GFRNONAA >60 05/30/2024 0838   GFRNONAA >60 11/17/2013 0621     No results found.     Assessment & Plan:   1. Atherosclerosis of native arteries of the extremities with ulceration (HCC) (Primary) Peripheral artery disease with graft stenosis Chronic peripheral artery disease with prior bypass graft, significant drop in right leg ABI from 0.98 to 0.54, distal graft anastomosis stenosis. Increased risk for graft occlusion and compromised distal perfusion. Early intervention indicated to restore blood flow and preserve limb integrity.    Recommend:  The patient has evidence of severe atherosclerotic changes of both lower extremities associated with ulceration and tissue loss of the right foot.  This represents a limb threatening ischemia and places the patient at the risk for right limb loss.  Patient should undergo angiography of the right lower extremity with the hope  for intervention for limb salvage.  The risks and benefits as well as the alternative therapies was discussed in detail with the patient.  All questions were answered.  Patient agrees to proceed with right angiography.  The patient will follow up with me in the office after the procedure.  2. Essential hypertension Continue antihypertensive medications as already ordered, these medications have been reviewed and there are no changes at this time.  3. Lymphedema Lymphedema of lower extremity Chronic right  lower extremity lymphedema, likely secondary to prior vascular surgery and lymphatic disruption, exacerbated by dependent positioning. No evidence of acute complication. - Measured calf circumference (37 cm/15 inches) for compression sock sizing. - Recommended compression socks and provided instructions for obtaining appropriate size.   Current Outpatient Medications on File Prior to Visit  Medication Sig Dispense Refill   acetaminophen  (TYLENOL ) 325 MG tablet Take 2 tablets (650 mg total) by mouth every 6 (six) hours as needed for mild pain (pain score 1-3) or fever.     ascorbic acid  (VITAMIN C ) 500 MG tablet Take 1 tablet (500 mg total) by mouth 2 (two) times daily. 60 tablet 0   atorvastatin  (LIPITOR ) 40 MG tablet Take 40 mg by mouth daily.     Cholecalciferol  (VITAMIN D3) 125 MCG (5000 UT) CAPS Take 1 capsule by mouth daily.     clopidogrel  (PLAVIX ) 75 MG tablet Take 1 tablet (75 mg total) by mouth daily at 6 (six) AM. 60 tablet 6   ELIQUIS  5 MG TABS tablet Take 5 mg by mouth 2 (two) times daily.     gabapentin  (NEURONTIN ) 100 MG capsule Take 100 mg by mouth 3 (three) times daily.     HUMALOG  KWIKPEN 100 UNIT/ML KwikPen Inject 5 Units into the skin 3 (three) times daily.     insulin  glargine (LANTUS ) 100 UNIT/ML Solostar Pen Inject 5 Units into the skin at bedtime.     losartan  (COZAAR ) 25 MG tablet Take 1 tablet (25 mg total) by mouth daily. 30 tablet 0   nicotine  (NICODERM CQ  - DOSED IN MG/24 HOURS) 21 mg/24hr patch Place 1 patch (21 mg total) onto the skin daily. 30 patch 0   oxyCODONE  (OXY IR/ROXICODONE ) 5 MG immediate release tablet Take 1-2 tablets (5-10 mg total) by mouth every 4 (four) hours as needed for moderate pain (pain score 4-6) or severe pain (pain score 7-10). 15 tablet 0   TRULICITY 1.5 MG/0.5ML SOAJ Inject 1.5 mg into the skin once a week.     Zinc  Sulfate 220 (50 Zn) MG TABS Take 1 tablet (220 mg total) by mouth daily after supper. 30 tablet 0   No current  facility-administered medications on file prior to visit.    There are no Patient Instructions on file for this visit. No follow-ups on file.   Aleina Burgio E Ephraim Reichel, NP      [1] No Known Allergies  "

## 2024-09-28 NOTE — Progress Notes (Signed)
 "  Subjective:    Patient ID: Austin [name], male    DOB: 03/31/1956, 68 y.o.   MRN: [id number] Chief Complaint  Patient presents with   Follow-up    3 month follow up + ABI LE art     HPI  Discussed the use of AI scribe software for clinical note transcription with the patient, who gave verbal consent to proceed.  History of Present Illness Austin [name] is a 68 year old male with right lower extremity peripheral artery disease status post bypass graft who presents for evaluation of right leg swelling.  He has persistent right lower extremity swelling, most pronounced in the foot and calf, which has been present since his prior vascular surgery. The swelling is exacerbated by dependent positioning and causes difficulty fitting pants over the area. He ambulates regularly but experiences a sensation of tightness in the right leg with ambulation. He denies pain.  He underwent prior right lower extremity bypass surgery performed by Dr. Marea. Serial ankle-brachial index measurements have declined from 0.98 to 0.54 in the right leg. He has previously undergone angiograms without complications.  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    Results Diagnostic Right lower extremity ABI (09/28/2024): 0.54 decreased from 0.98 Lower extremity bypass graft evaluation (09/28/2024): Distal graft anastomosis with significant stenosis   Review of Systems  Cardiovascular:  Positive for leg swelling.  Skin:  Positive for wound.  Neurological:  Positive for weakness.  All other systems reviewed and are negative.      Objective:   Physical Exam Vitals reviewed.  HENT:     Head: Normocephalic.  Cardiovascular:     Rate and Rhythm: Normal rate and regular rhythm.  Pulmonary:     Effort: Pulmonary effort is normal.  Musculoskeletal:     Right lower leg: Edema present.     Left Lower Extremity: Left leg is amputated below knee.  Skin:     General: Skin is warm and dry.  Neurological:     Mental Status: He is alert and oriented to person, place, and time.     Motor: Weakness present.     Gait: Gait abnormal.  Psychiatric:        Mood and Affect: Mood normal.        Behavior: Behavior normal.        Thought Content: Thought content normal.        Judgment: Judgment normal.     Physical Exam EXTREMITIES: Superficial scabbed ulcer on the bottom of the foot. Swelling in the leg.  BP 114/61 (BP Location: Left Arm, Patient Position: Sitting, Cuff Size: Normal)   Pulse 96   Resp 18   Ht 6' 4 (1.93 m)   Wt 180 lb (81.6 kg)   BMI 21.91 kg/m   Past Medical History:  Diagnosis Date   Atrial fibrillation (HCC)    Coronary artery disease    Diabetes (HCC)    DVT (deep venous thrombosis) (HCC)    Gangrene of left foot (HCC) 02/09/2018   GERD (gastroesophageal reflux disease)    Hyperlipidemia    Hypertension    Peripheral vascular disease     Social History   Socioeconomic History   Marital status: Divorced    Spouse name: Not on file   Number of children: Not on file   Years of education: Not on file   Highest education level: Not on file  Occupational History  Not on file  Tobacco Use   Smoking status: Former    Current packs/day: 0.00    Average packs/day: 1 pack/day for 47.0 years (47.0 ttl pk-yrs)    Types: Cigarettes    Start date: 01/1974    Quit date: 01/2021    Years since quitting: 3.7   Smokeless tobacco: Never  Vaping Use   Vaping status: Never Used  Substance and Sexual Activity   Alcohol use: No   Drug use: No   Sexual activity: Not Currently  Other Topics Concern   Not on file  Social History Narrative   Not on file   Social Drivers of Health   Tobacco Use: Medium Risk (09/28/2024)   Patient History    Smoking Tobacco Use: Former    Smokeless Tobacco Use: Never    Passive Exposure: Not on Actuary Strain: Not on file  Food Insecurity: No Food Insecurity  (05/27/2024)   Epic    Worried About Programme Researcher, Broadcasting/film/video in the Last Year: Never true    Ran Out of Food in the Last Year: Never true  Transportation Needs: No Transportation Needs (05/27/2024)   Epic    Lack of Transportation (Medical): No    Lack of Transportation (Non-Medical): No  Physical Activity: Not on file  Stress: Not on file  Social Connections: Moderately Isolated (05/27/2024)   Social Connection and Isolation Panel    Frequency of Communication with Friends and Family: More than three times a week    Frequency of Social Gatherings with Friends and Family: More than three times a week    Attends Religious Services: 1 to 4 times per year    Active Member of [employer] or Organizations: No    Attends Banker Meetings: Never    Marital Status: Divorced  Catering Manager Violence: Not At Risk (05/27/2024)   Epic    Fear of Current or Ex-Partner: No    Emotionally Abused: No    Physically Abused: No    Sexually Abused: No  Depression (PHQ2-9): Low Risk (06/25/2022)   Depression (PHQ2-9)    PHQ-2 Score: 0  Alcohol Screen: Not on file  Housing: Low Risk (05/27/2024)   Epic    Unable to Pay for Housing in the Last Year: No    Number of Times Moved in the Last Year: 0    Homeless in the Last Year: No  Utilities: Not At Risk (05/27/2024)   Epic    Threatened with loss of utilities: No  Health Literacy: Not on file    Past Surgical History:  Procedure Laterality Date   ABDOMINAL AORTOGRAM W/LOWER EXTREMITY N/A 02/20/2022   Procedure: ABDOMINAL AORTOGRAM W/LOWER EXTREMITY;  Surgeon: Gretta Lonni PARAS, MD;  Location: [facility];  Service: Cardiovascular;  Laterality: N/A;   AMPUTATION Left 10/28/2018   Procedure: AMPUTATION BELOW KNEE;  Surgeon: Marea Selinda RAMAN, MD;  Location: [facility];  Service: Vascular;  Laterality: Left;   AMPUTATION Left 09/10/2021   Procedure: AMPUTATION DIGIT;  Surgeon: Elisabeth Craig RAMAN, MD;  Location: [facility];  Service: Plastics;  Laterality:  Left;   AMPUTATION Left 09/14/2021   Procedure: AMPUTATION DIGIT left long finger and irrigation and debridement;  Surgeon: Elisabeth Craig RAMAN, MD;  Location: [facility];  Service: Plastics;  Laterality: Left;   AMPUTATION Right 02/27/2022   Procedure: RIGHT PARTIAL AMPUTATION FOOT;  Surgeon: Janit Thresa HERO, DPM;  Location: [facility];  Service: Podiatry;  Laterality: Right;   AMPUTATION TOE  Left 02/10/2018   Procedure: AMPUTATION TOE;  Surgeon: Neill Boas, DPM;  Location: [facility];  Service: Podiatry;  Laterality: Left;   AMPUTATION TOE Right 11/06/2023   Procedure: Right 4th Toe Amputation;  Surgeon: Malvin Marsa FALCON, DPM;  Location: [facility];  Service: Orthopedics/Podiatry;  Laterality: Right;   APPLICATION OF WOUND VAC Left 10/16/2018   Procedure: APPLICATION OF WOUND VAC;  Surgeon: Ashley Soulier, DPM;  Location: [facility];  Service: Podiatry;  Laterality: Left;   DORSAL SLIT N/A 01/12/2021   Procedure: DORSAL SLIT;  Surgeon: Francisca Redell BROCKS, MD;  Location: [facility];  Service: Urology;  Laterality: N/A;   FEMORAL-TIBIAL BYPASS GRAFT Right 02/25/2022   Procedure: RIGHT LOWER EXTREMITY FEMORAL TO TIBIAL PERONEAL TRUNK BYPASS;  Surgeon: Gretta Lonni PARAS, MD;  Location: [facility];  Service: Vascular;  Laterality: Right;  INSERT ARTERIAL LINE   groin surgery     IRRIGATION AND DEBRIDEMENT FOOT Left 09/30/2018   Procedure: IRRIGATION AND DEBRIDEMENT FOOT;  Surgeon: Neill Boas, DPM;  Location: [facility];  Service: Podiatry;  Laterality: Left;   IRRIGATION AND DEBRIDEMENT FOOT Right 05/29/2024   Procedure: IRRIGATION AND DEBRIDEMENT FOOT;  Surgeon: Janit Thresa HERO, DPM;  Location: [facility];  Service: Orthopedics/Podiatry;  Laterality: Right;   LOWER EXTREMITY ANGIOGRAPHY Left 02/12/2018   Procedure: Lower Extremity Angiography;  Surgeon: Marea Selinda RAMAN, MD;  Location: [facility];  Service: Cardiovascular;  Laterality: Left;   LOWER EXTREMITY ANGIOGRAPHY Left 10/01/2018   Procedure: Lower Extremity  Angiography;  Surgeon: Marea Selinda RAMAN, MD;  Location: [facility];  Service: Cardiovascular;  Laterality: Left;   LOWER EXTREMITY ANGIOGRAPHY Left 10/19/2018   Procedure: Lower Extremity Angiography;  Surgeon: Marea Selinda RAMAN, MD;  Location: [facility];  Service: Cardiovascular;  Laterality: Left;   LOWER EXTREMITY ANGIOGRAPHY Left 10/20/2018   Procedure: LOWER EXTREMITY ANGIOGRAPHY;  Surgeon: Marea Selinda RAMAN, MD;  Location: [facility];  Service: Cardiovascular;  Laterality: Left;   LOWER EXTREMITY ANGIOGRAPHY Right 11/25/2018   Procedure: LOWER EXTREMITY ANGIOGRAPHY;  Surgeon: Marea Selinda RAMAN, MD;  Location: [facility];  Service: Cardiovascular;  Laterality: Right;   LOWER EXTREMITY ANGIOGRAPHY Right 01/10/2020   Procedure: LOWER EXTREMITY ANGIOGRAPHY;  Surgeon: Marea Selinda RAMAN, MD;  Location: [facility];  Service: Cardiovascular;  Laterality: Right;   LOWER EXTREMITY ANGIOGRAPHY Right 12/11/2020   Procedure: LOWER EXTREMITY ANGIOGRAPHY;  Surgeon: Marea Selinda RAMAN, MD;  Location: [facility];  Service: Cardiovascular;  Laterality: Right;   LOWER EXTREMITY ANGIOGRAPHY Right 06/26/2023   Procedure: Lower Extremity Angiography;  Surgeon: Jama Cordella MATSU, MD;  Location: [facility];  Service: Cardiovascular;  Laterality: Right;   LOWER EXTREMITY ANGIOGRAPHY Right 11/05/2023   Procedure: Lower Extremity Angiography;  Surgeon: Marea Selinda RAMAN, MD;  Location: [facility];  Service: Cardiovascular;  Laterality: Right;   LOWER EXTREMITY ANGIOGRAPHY Right 12/05/2023   Procedure: Lower Extremity Angiography;  Surgeon: Jama Cordella MATSU, MD;  Location: [facility];  Service: Cardiovascular;  Laterality: Right;   LOWER EXTREMITY ANGIOGRAPHY Right 05/28/2024   Procedure: Lower Extremity Angiography;  Surgeon: Marea Selinda RAMAN, MD;  Location: [facility];  Service: Cardiovascular;  Laterality: Right;   LOWER EXTREMITY INTERVENTION  02/12/2018    Procedure: LOWER EXTREMITY INTERVENTION;  Surgeon: Marea Selinda RAMAN, MD;  Location: [facility];  Service: Cardiovascular;;   METATARSAL HEAD EXCISION Right 05/29/2024   Procedure: EXCISION, METATARSAL BONE, HEAD;  Surgeon: Janit Thresa HERO, DPM;  Location: [facility];  Service: Orthopedics/Podiatry;  Laterality: Right;   MINOR IRRIGATION AND DEBRIDEMENT OF WOUND Left 09/10/2021   Procedure: IRRIGATION AND DEBRIDEMENT OF LEFT LONG FINGER WOUND;  Surgeon: Elisabeth Craig RAMAN, MD;  Location: [facility];  Service: Plastics;  Laterality: Left;   NECK SURGERY     PERIPHERAL VASCULAR INTERVENTION  02/20/2022   Procedure: PERIPHERAL VASCULAR INTERVENTION;  Surgeon: Gretta Lonni PARAS, MD;  Location: [facility];  Service: Cardiovascular;;  right iliac   TRANSMETATARSAL AMPUTATION Left 10/16/2018   Procedure: TRANSMETATARSAL AMPUTATION LEFT FOOT;  Surgeon: Ashley Soulier, DPM;  Location: [facility];  Service: Podiatry;  Laterality: Left;   VEIN HARVEST Right 02/25/2022   Procedure: VEIN HARVEST OF RIGHT GREATER SAPHENOUS VEIN;  Surgeon: Gretta Lonni PARAS, MD;  Location: [facility];  Service: Vascular;  Laterality: Right;    Family History  Problem Relation Age of Onset   Diabetes Mother    Coronary artery disease Father    Hypertension Sister    Leukemia Brother     Allergies[1]     Latest Ref Rng & Units 05/30/2024    8:38 AM 05/28/2024   10:14 AM 05/26/2024   10:05 PM  CBC  WBC 4.0 - 10.5 K/uL 7.9  8.5  9.6   Hemoglobin 13.0 - 17.0 g/dL 86.7  86.5  85.9   Hematocrit 39.0 - 52.0 % 39.3  39.8  42.9   Platelets 150 - 400 K/uL 186  192  222       CMP     Component Value Date/Time   NA 139 05/30/2024 0838   NA 135 (L) 11/17/2013 0621   K 4.3 05/30/2024 0838   K 4.3 11/17/2013 0621   CL 103 05/30/2024 0838   CL 99 11/17/2013 0621   CO2 23 05/30/2024 0838   CO2 31 11/17/2013 0621   GLUCOSE 136 (H) 05/30/2024 0838   GLUCOSE 172 (H) 11/17/2013 0621   BUN 14 05/30/2024 0838   BUN 10  11/17/2013 0621   CREATININE 0.87 05/30/2024 0838   CREATININE 1.05 04/18/2022 1034   CALCIUM  9.0 05/30/2024 0838   CALCIUM  9.6 11/17/2013 0621   PROT 7.2 05/26/2024 2205   PROT 8.5 (H) 01/10/2013 2155   ALBUMIN 3.4 (L) 05/26/2024 2205   ALBUMIN 4.0 01/10/2013 2155   AST 18 05/26/2024 2205   AST 16 01/10/2013 2155   ALT 15 05/26/2024 2205   ALT 26 01/10/2013 2155   ALKPHOS 72 05/26/2024 2205   ALKPHOS 91 01/10/2013 2155   BILITOT <0.2 05/26/2024 2205   BILITOT 0.3 01/10/2013 2155   EGFR 78 04/18/2022 1034   GFRNONAA >60 05/30/2024 0838   GFRNONAA >60 11/17/2013 0621     No results found.     Assessment & Plan:   1. Atherosclerosis of native arteries of the extremities with ulceration (HCC) (Primary) Peripheral artery disease with graft stenosis Chronic peripheral artery disease with prior bypass graft, significant drop in right leg ABI from 0.98 to 0.54, distal graft anastomosis stenosis. Increased risk for graft occlusion and compromised distal perfusion. Early intervention indicated to restore blood flow and preserve limb integrity.    Recommend:  The patient has evidence of severe atherosclerotic changes of both lower extremities associated with ulceration and tissue loss of the right foot.  This represents a limb threatening ischemia and places the patient at the risk for right limb loss.  Patient should undergo angiography of the right lower extremity with the hope  for intervention for limb salvage.  The risks and benefits as well as the alternative therapies was discussed in detail with the patient.  All questions were answered.  Patient agrees to proceed with right angiography.  The patient will follow up with me in the office after the procedure.  2. Essential hypertension Continue antihypertensive medications as already ordered, these medications have been reviewed and there are no changes at this time.  3. Lymphedema Lymphedema of lower extremity Chronic right  lower extremity lymphedema, likely secondary to prior vascular surgery and lymphatic disruption, exacerbated by dependent positioning. No evidence of acute complication. - Measured calf circumference (37 cm/15 inches) for compression sock sizing. - Recommended compression socks and provided instructions for obtaining appropriate size.   Current Outpatient Medications on File Prior to Visit  Medication Sig Dispense Refill   acetaminophen  (TYLENOL ) 325 MG tablet Take 2 tablets (650 mg total) by mouth every 6 (six) hours as needed for mild pain (pain score 1-3) or fever.     ascorbic acid  (VITAMIN C ) 500 MG tablet Take 1 tablet (500 mg total) by mouth 2 (two) times daily. 60 tablet 0   atorvastatin  (LIPITOR ) 40 MG tablet Take 40 mg by mouth daily.     Cholecalciferol  (VITAMIN D3) 125 MCG (5000 UT) CAPS Take 1 capsule by mouth daily.     clopidogrel  (PLAVIX ) 75 MG tablet Take 1 tablet (75 mg total) by mouth daily at 6 (six) AM. 60 tablet 6   ELIQUIS  5 MG TABS tablet Take 5 mg by mouth 2 (two) times daily.     gabapentin  (NEURONTIN ) 100 MG capsule Take 100 mg by mouth 3 (three) times daily.     HUMALOG  KWIKPEN 100 UNIT/ML KwikPen Inject 5 Units into the skin 3 (three) times daily.     insulin  glargine (LANTUS ) 100 UNIT/ML Solostar Pen Inject 5 Units into the skin at bedtime.     losartan  (COZAAR ) 25 MG tablet Take 1 tablet (25 mg total) by mouth daily. 30 tablet 0   nicotine  (NICODERM CQ  - DOSED IN MG/24 HOURS) 21 mg/24hr patch Place 1 patch (21 mg total) onto the skin daily. 30 patch 0   oxyCODONE  (OXY IR/ROXICODONE ) 5 MG immediate release tablet Take 1-2 tablets (5-10 mg total) by mouth every 4 (four) hours as needed for moderate pain (pain score 4-6) or severe pain (pain score 7-10). 15 tablet 0   TRULICITY 1.5 MG/0.5ML SOAJ Inject 1.5 mg into the skin once a week.     Zinc  Sulfate 220 (50 Zn) MG TABS Take 1 tablet (220 mg total) by mouth daily after supper. 30 tablet 0   No current  facility-administered medications on file prior to visit.    There are no Patient Instructions on file for this visit. No follow-ups on file.   Aleina Burgio E Ephraim Reichel, NP      [1] No Known Allergies  "

## 2024-10-11 ENCOUNTER — Encounter
Admission: RE | Disposition: A | Discharge status: Home / Self Care | Payer: Self-pay | Source: Home / Self Care | Attending: Vascular Surgery

## 2024-10-11 ENCOUNTER — Ambulatory Visit: Admission: RE | Admit: 2024-10-11 | Source: Home / Self Care | Admitting: Vascular Surgery

## 2024-10-11 ENCOUNTER — Other Ambulatory Visit: Payer: Self-pay

## 2024-10-11 ENCOUNTER — Telehealth (INDEPENDENT_AMBULATORY_CARE_PROVIDER_SITE_OTHER): Payer: Self-pay | Admitting: Nurse Practitioner

## 2024-10-11 ENCOUNTER — Encounter: Payer: Self-pay | Admitting: Vascular Surgery

## 2024-10-11 DIAGNOSIS — Z89612 Acquired absence of left leg above knee: Secondary | ICD-10-CM | POA: Diagnosis not present

## 2024-10-11 DIAGNOSIS — L97919 Non-pressure chronic ulcer of unspecified part of right lower leg with unspecified severity: Secondary | ICD-10-CM | POA: Diagnosis not present

## 2024-10-11 DIAGNOSIS — E11621 Type 2 diabetes mellitus with foot ulcer: Secondary | ICD-10-CM | POA: Diagnosis not present

## 2024-10-11 DIAGNOSIS — I70299 Other atherosclerosis of native arteries of extremities, unspecified extremity: Secondary | ICD-10-CM

## 2024-10-11 DIAGNOSIS — I10 Essential (primary) hypertension: Secondary | ICD-10-CM | POA: Insufficient documentation

## 2024-10-11 DIAGNOSIS — I70235 Atherosclerosis of native arteries of right leg with ulceration of other part of foot: Secondary | ICD-10-CM | POA: Insufficient documentation

## 2024-10-11 DIAGNOSIS — Z79899 Other long term (current) drug therapy: Secondary | ICD-10-CM | POA: Diagnosis not present

## 2024-10-11 DIAGNOSIS — Z9889 Other specified postprocedural states: Secondary | ICD-10-CM

## 2024-10-11 DIAGNOSIS — L97909 Non-pressure chronic ulcer of unspecified part of unspecified lower leg with unspecified severity: Secondary | ICD-10-CM | POA: Diagnosis present

## 2024-10-11 DIAGNOSIS — Z8249 Family history of ischemic heart disease and other diseases of the circulatory system: Secondary | ICD-10-CM | POA: Diagnosis not present

## 2024-10-11 DIAGNOSIS — Z7985 Long-term (current) use of injectable non-insulin antidiabetic drugs: Secondary | ICD-10-CM | POA: Diagnosis not present

## 2024-10-11 DIAGNOSIS — I70239 Atherosclerosis of native arteries of right leg with ulceration of unspecified site: Secondary | ICD-10-CM | POA: Diagnosis not present

## 2024-10-11 DIAGNOSIS — E1151 Type 2 diabetes mellitus with diabetic peripheral angiopathy without gangrene: Secondary | ICD-10-CM | POA: Insufficient documentation

## 2024-10-11 DIAGNOSIS — I890 Lymphedema, not elsewhere classified: Secondary | ICD-10-CM | POA: Diagnosis not present

## 2024-10-11 DIAGNOSIS — Z89421 Acquired absence of other right toe(s): Secondary | ICD-10-CM | POA: Diagnosis not present

## 2024-10-11 DIAGNOSIS — Z87891 Personal history of nicotine dependence: Secondary | ICD-10-CM | POA: Diagnosis not present

## 2024-10-11 DIAGNOSIS — I70339 Atherosclerosis of unspecified type of bypass graft(s) of the right leg with ulceration of unspecified site: Secondary | ICD-10-CM

## 2024-10-11 DIAGNOSIS — L97511 Non-pressure chronic ulcer of other part of right foot limited to breakdown of skin: Secondary | ICD-10-CM | POA: Diagnosis not present

## 2024-10-11 DIAGNOSIS — Z794 Long term (current) use of insulin: Secondary | ICD-10-CM | POA: Insufficient documentation

## 2024-10-11 DIAGNOSIS — T82856A Stenosis of peripheral vascular stent, initial encounter: Secondary | ICD-10-CM | POA: Diagnosis not present

## 2024-10-11 HISTORY — PX: LOWER EXTREMITY ANGIOGRAPHY: CATH118251

## 2024-10-11 LAB — BUN: BUN: 12 mg/dL (ref 8–23)

## 2024-10-11 LAB — GLUCOSE, CAPILLARY: Glucose-Capillary: 150 mg/dL — ABNORMAL HIGH (ref 70–99)

## 2024-10-11 LAB — CREATININE, SERUM
Creatinine, Ser: 0.73 mg/dL (ref 0.61–1.24)
GFR, Estimated: >60 mL/min

## 2024-10-11 SURGERY — LOWER EXTREMITY ANGIOGRAPHY
Anesthesia: Moderate Sedation | Site: Leg Lower | Laterality: Right

## 2024-10-11 MED ORDER — HYDROMORPHONE HCL 1 MG/ML IJ SOLN: 1.0000 mg | Freq: Once | INTRAMUSCULAR | Status: DC | PRN

## 2024-10-11 MED ORDER — ONDANSETRON HCL 4 MG/2ML IJ SOLN: 4.0000 mg | Freq: Four times a day (QID) | INTRAMUSCULAR | Status: DC | PRN

## 2024-10-11 MED ORDER — ACETAMINOPHEN 325 MG PO TABS: 650.0000 mg | ORAL_TABLET | ORAL | Status: DC | PRN

## 2024-10-11 MED ORDER — HYDRALAZINE HCL 20 MG/ML IJ SOLN: 5.0000 mg | INTRAMUSCULAR | Status: DC | PRN

## 2024-10-11 MED ORDER — FENTANYL CITRATE (PF) 100 MCG/2ML IJ SOLN
INTRAMUSCULAR | Status: AC
  Filled 2024-10-11: qty 2

## 2024-10-11 MED ORDER — HEPARIN SODIUM (PORCINE) 1000 UNIT/ML IJ SOLN
INTRAMUSCULAR | Status: AC
  Filled 2024-10-11: qty 10

## 2024-10-11 MED ORDER — SODIUM CHLORIDE 0.9 % IV SOLN: INTRAVENOUS | Status: DC

## 2024-10-11 MED ORDER — HEPARIN SODIUM (PORCINE) 1000 UNIT/ML IJ SOLN
INTRAMUSCULAR | Status: DC | PRN
  Administered 2024-10-11: 5000 [IU] via INTRAVENOUS

## 2024-10-11 MED ORDER — NITROGLYCERIN 1 MG/10 ML FOR IR/CATH LAB
INTRA_ARTERIAL | Status: AC
  Filled 2024-10-11: qty 10

## 2024-10-11 MED ORDER — MIDAZOLAM HCL 2 MG/ML PO SYRP: 8.0000 mg | ORAL_SOLUTION | Freq: Once | ORAL | Status: DC | PRN

## 2024-10-11 MED ORDER — FENTANYL CITRATE (PF) 100 MCG/2ML IJ SOLN
INTRAMUSCULAR | Status: DC | PRN
  Administered 2024-10-11: 50 ug via INTRAVENOUS

## 2024-10-11 MED ORDER — MIDAZOLAM HCL 5 MG/5ML IJ SOLN
INTRAMUSCULAR | Status: AC
  Filled 2024-10-11: qty 5

## 2024-10-11 MED ORDER — SODIUM CHLORIDE 0.9% FLUSH: 3.0000 mL | Freq: Two times a day (BID) | INTRAVENOUS | Status: DC

## 2024-10-11 MED ORDER — CEFAZOLIN SODIUM-DEXTROSE 2-4 GM/100ML-% IV SOLN
2.0000 g | INTRAVENOUS | Status: AC
  Administered 2024-10-11: 2 g via INTRAVENOUS

## 2024-10-11 MED ORDER — HEPARIN (PORCINE) IN NACL 1000-0.9 UT/500ML-% IV SOLN
INTRAVENOUS | Status: DC | PRN
  Administered 2024-10-11: 1000 mL

## 2024-10-11 MED ORDER — MIDAZOLAM HCL (PF) 2 MG/2ML IJ SOLN
INTRAMUSCULAR | Status: DC | PRN
  Administered 2024-10-11: 2 mg via INTRAVENOUS

## 2024-10-11 MED ORDER — SODIUM CHLORIDE 0.9 % IV SOLN: 250.0000 mL | INTRAVENOUS | Status: DC | PRN

## 2024-10-11 MED ORDER — LIDOCAINE-EPINEPHRINE (PF) 1 %-1:200000 IJ SOLN
INTRAMUSCULAR | Status: DC | PRN
  Administered 2024-10-11: 10 mL via INTRADERMAL

## 2024-10-11 MED ORDER — DIPHENHYDRAMINE HCL 50 MG/ML IJ SOLN: 50.0000 mg | Freq: Once | INTRAMUSCULAR | Status: DC | PRN

## 2024-10-11 MED ORDER — NITROGLYCERIN 1 MG/10 ML FOR IR/CATH LAB
INTRA_ARTERIAL | Status: DC | PRN
  Administered 2024-10-11: 400 ug via INTRA_ARTERIAL

## 2024-10-11 MED ORDER — SODIUM CHLORIDE 0.9% FLUSH: 3.0000 mL | INTRAVENOUS | Status: DC | PRN

## 2024-10-11 MED ORDER — IODIXANOL 320 MG/ML IV SOLN
INTRAVENOUS | Status: DC | PRN
  Administered 2024-10-11: 85 mL via INTRA_ARTERIAL

## 2024-10-11 MED ORDER — FAMOTIDINE 20 MG PO TABS: 40.0000 mg | ORAL_TABLET | Freq: Once | ORAL | Status: DC | PRN

## 2024-10-11 MED ORDER — METHYLPREDNISOLONE SODIUM SUCC 125 MG IJ SOLR: 125.0000 mg | Freq: Once | INTRAMUSCULAR | Status: DC | PRN

## 2024-10-11 MED ORDER — LABETALOL HCL 5 MG/ML IV SOLN: 10.0000 mg | INTRAVENOUS | Status: DC | PRN

## 2024-10-11 SURGICAL SUPPLY — 28 items
BALLOON JADE .014 3.0 X 40 (BALLOONS) IMPLANT
BALLOON JADE .014 4.0 X 40 0 (BALLOONS) IMPLANT
BALLOON LUTONIX 018 4X100X130 (BALLOONS) IMPLANT
BALLOON LUTONIX 018 5X60X130 (BALLOONS) IMPLANT
BALLOON LUTONIX DCB 5X60X130 (BALLOONS) IMPLANT
BALLOON ULTRVRSE 2.5X220X150 (BALLOONS) IMPLANT
BALLOON ULTRVRSE 6X40X130 OTE (BALLOONS) IMPLANT
CATH ANGIO 5F PIGTAIL 65CM (CATHETERS) IMPLANT
CATH BEACON 5 .038 100 VERT TP (CATHETERS) IMPLANT
CATH CXI SUPP ANG 4FR 135 (CATHETERS) IMPLANT
COVER PROBE ULTRASOUND 5X96 (MISCELLANEOUS) IMPLANT
DEVICE PRESTO INFLATION (MISCELLANEOUS) IMPLANT
DEVICE STARCLOSE SE CLOSURE (Vascular Products) IMPLANT
GLIDEWIRE ADV .035X260CM (WIRE) IMPLANT
GUIDEWIRE PFTE-COATED .018X300 (WIRE) IMPLANT
GUIDEWIRE SUPER STIFF .035X180 (WIRE) IMPLANT
KIT MICROPUNCTURE VSI 5F STIFF (SHEATH) IMPLANT
PACK ANGIOGRAPHY (CUSTOM PROCEDURE TRAY) ×1 IMPLANT
SHEATH ANL2 6FRX45 HC (SHEATH) IMPLANT
SHEATH BRITE TIP 4FRX11 (SHEATH) IMPLANT
SHEATH BRITE TIP 5FRX11 (SHEATH) IMPLANT
STENT OTW ESPRIT BTK 3.0X38 (Permanent Stent) IMPLANT
STENT OTW ESPRIT BTK 3.5X38 (Permanent Stent) IMPLANT
STENT VIABAHN 5X50X120 (Permanent Stent) IMPLANT
SYR MEDRAD MARK 7 150ML (SYRINGE) IMPLANT
TUBING CONTRAST HIGH PRESS 72 (TUBING) IMPLANT
WIRE COMMAND ST ANG 014 300 (WIRE) IMPLANT
WIRE J 3MM .035X145CM (WIRE) IMPLANT

## 2024-10-11 NOTE — Telephone Encounter (Signed)
 Called patient x2 and lvm to return call for scheduling post op appointment.Follow up with Brown, Fallon E, NP (Vascular Surgery) in 4 weeks (11/08/2024); with ABI

## 2024-10-11 NOTE — Interval H&P Note (Signed)
 History and Physical Interval Note:  10/11/2024 8:25 AM  Austin [name]  has presented today for surgery, with the diagnosis of RLE Angio    ASO w ulceration.  The various methods of treatment have been discussed with the patient and family. After consideration of risks, benefits and other options for treatment, the patient has consented to  Procedures: Lower Extremity Angiography (Right) as a surgical intervention.  [name] history has been reviewed, patient examined, no change in status, stable for surgery.  I have reviewed the patient's chart and labs.  Questions were answered to the patient's satisfaction.     Wylee Ogden

## 2024-10-11 NOTE — Op Note (Signed)
 [facility]  Percutaneous Study/Intervention Procedural Note   Date of Surgery: 10/11/2024  Surgeon(s):Dannell Raczkowski    Assistants:none  Pre-operative Diagnosis: PAD with ulceration right lower extremity  Post-operative diagnosis:  Same  Procedure(s) Performed:             1.  Ultrasound guidance for vascular access left femoral artery             2.  Catheter placement into right peroneal artery from left femoral approach             3.  Aortogram and selective right lower extremity angiogram             4.  Percutaneous transluminal angioplasty of right peroneal artery and tibioperoneal trunk with 2.5 mm diameter by 22 cm length angioplasty balloon             5.  Percutaneous transluminal angioplasty of right distal portion of the FEM distal bypass analogous to the popliteal artery with 4 mm diameter by 10 cm length [name] drug-coated angioplasty balloon  6.  Percutaneous transluminal angioplasty of the right proximal portion of the FEM distal bypass analogous to the proximal SFA or common femoral artery with 5 mm diameter by 6 cm length [name] drug-coated angioplasty balloon  7.  Stent placement x 2 to the right peroneal artery and tibioperoneal trunk using a 3 mm diameter by 38 mm length Esprit and a 4 mm diameter by 38 mm length Esprit for residual occlusion after angioplasty  8.  Stent placement to the distal portion of the FEM distal bypass analogous to the popliteal artery with 5 mm diameter by 5 cm length Viabahn stent for greater than 50% residual stenosis after angioplasty             9.  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 The patient has noninvasive study showing significant reduction in flow in the right leg. The patient is brought in for angiography for further evaluation and potential treatment.  Due to the limb threatening nature of the situation, angiogram was performed for attempted limb salvage. The patient is aware that if the procedure fails, amputation would be expected.  The patient also understands that even with successful revascularization, amputation may still be required due to the severity of the situation.  Risks and benefits are discussed and informed consent is obtained.   Procedure:  The patient was identified and appropriate procedural time out was performed.  The patient was then placed supine on the table and prepped and draped in the usual sterile fashion. Moderate conscious sedation was administered during a face to face encounter with the patient throughout the procedure with my supervision of the RN administering medicines and monitoring the patient's vital signs, pulse oximetry, telemetry and mental status throughout from the start of the procedure until the patient was taken to the recovery room. Ultrasound was used to evaluate the left common femoral artery.  It was patent .  A digital ultrasound image was  acquired.  A Seldinger needle was used to access the left common femoral artery under direct ultrasound guidance and a permanent image was performed.  A 0.035 J wire was advanced without resistance and a 5Fr sheath was placed.  Pigtail catheter was placed into the aorta and an AP aortogram was performed. This demonstrated normal renal arteries and normal aorta and iliac segments without significant stenosis after extensive previous stenting. I then crossed the aortic bifurcation and advanced to the right femoral head. Selective right lower extremity angiogram was then performed. 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  There was a bypass from the right common femoral artery to the right tibioperoneal trunk and proximal peroneal artery.  There was a moderate stenosis in the proximal portion of the bypass in the 60 to 70% range.  The bypass then normalized down to the level of the knee and just below the knee where there was a string sign nearly occlusive stenosis of the distal portion of the bypass and will be analogous to the below-knee popliteal artery.  This was then plugged into the tibioperoneal trunk and the TP trunk and peroneal artery had been previously stented and the stents were occluded.  The occlusion was over a fairly long segment of about 8 to 10 cm and there were collaterals filling the anterior tibial artery.  The peroneal artery did reconstitute in the midsegment and was continuous distally.  The posterior tibial artery was chronically occluded.  This was certainly a very complex and difficult situation.  It was felt that it was in the patient's best interest to proceed with intervention after these images to avoid a second procedure and a larger amount of contrast and fluoroscopy based off of the findings from the initial angiogram. The patient was systemically heparinized and a 6 French Ansell sheath was then placed over the [employer] wire. I then used a Kumpe catheter and the advantage wire to get into the bypass and then exchanged for a CXI catheter and a 0.018 advantage wire.  I crossed the separate distinct lesions in the proximal portion of the bypass graft and the distal portion of the bypass graft and then got down into the tibioperoneal artery and the occluded stents.  I was able to cross the occluded stents in the tibioperoneal artery and peroneal artery.  This was with some complexity and difficulty due to the chronic occlusion of the tibial vessel and it having been previously stented.  I then exchanged for a command 14  wire.  A 2.5 mm diameter by 22 cm length angioplasty balloon was then inflated in the peroneal artery and tibioperoneal trunk and taken up to 12 atm for 1 minute.  A 4 mm diameter by 10 cm length [name] drug-coated angioplasty balloon was inflated in the distal portion of the bypass graft analogous to the popliteal artery and the very high-grade greater than 5% stenosis at this location.  This was inflated to burst inflation but the waist did not entirely break and there remained a greater than 70% residual stenosis after angioplasty in this location.  The TP trunk and peroneal artery also remained nearly occluded after angioplasty.  I felt we had no option except to stent to each of these areas.  For the peroneal artery and tibioperoneal trunk this required 2 Esprit stents.  A 3 mm diameter by 38 mm length and a 4 mm diameter by 38 mm length Esprit scaffolding  system was selected and deployed first in the peroneal artery and then back into the tibioperoneal trunk just distal to the bypass.  These were postdilated with Jade balloons.  A 5 mm diameter by 5 cm length Viabahn stent was selected and deployed at the distal bypass graft analogous to the popliteal artery and postdilated with a 5 and a 6 mm diameter balloon.  The distal portion of the Viabahn crest now had about a 15 to 20% residual stenosis but was markedly improved.  There was an increased amount of vasospasm was treated with topical nitroglycerin , but after this completion imaging of the Esprit stents in the tibioperoneal trunk and peroneal artery showed marked improvement and this was now in line with less than 20% residual stenosis.  I then replaced the 0.035 advantage wire and performed angioplasty of the proximal portion of the bypass graft with a 5 mm diameter by 6 cm length [name] drug-coated angioplasty balloon inflated to 12 atm for 1 minute.  Completion imaging showed about a 30% residual stenosis after angioplasty of the proximal portion of  the bypass. I elected to terminate the procedure. The sheath was removed and StarClose closure device was deployed in the left femoral artery with excellent hemostatic result. The patient was taken to the recovery room in stable condition having tolerated the procedure well.  Findings:               Aortogram:  This demonstrated normal renal arteries and normal aorta and iliac segments without significant stenosis after extensive previous stenting.             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  There was a bypass from the right common femoral artery to the right tibioperoneal trunk and proximal peroneal artery.  There was a moderate stenosis in the proximal portion of the bypass in the 60 to 70% range.  The bypass then normalized down to the level of the knee and just below the knee where there was a string sign nearly occlusive stenosis of the distal portion of the bypass and will be analogous to the below-knee popliteal artery.  This was then plugged into the tibioperoneal trunk and the TP trunk and peroneal artery had been previously stented and the stents were occluded.  The occlusion was over a fairly long segment of about 8 to 10 cm and there were collaterals filling the anterior tibial artery.  The peroneal artery did reconstitute in the midsegment and was continuous distally.  The posterior tibial artery was chronically occluded.   Disposition: Patient was taken to the recovery room in stable condition having tolerated the procedure well.  Complications: None  Selinda Gu 10/11/2024 10:46 AM   This note was created with Dragon Medical transcription system. Any errors in dictation are purely unintentional.

## 2024-10-12 ENCOUNTER — Encounter: Payer: Self-pay | Admitting: Vascular Surgery

## 2024-10-15 ENCOUNTER — Ambulatory Visit: Admitting: Podiatry

## 2024-10-15 DIAGNOSIS — Z91199 Patient's noncompliance with other medical treatment and regimen due to unspecified reason: Secondary | ICD-10-CM

## 2024-10-15 NOTE — Progress Notes (Signed)
 1. No-show for appointment

## 2024-11-08 ENCOUNTER — Ambulatory Visit (INDEPENDENT_AMBULATORY_CARE_PROVIDER_SITE_OTHER): Admitting: Nurse Practitioner

## 2024-11-08 ENCOUNTER — Encounter (INDEPENDENT_AMBULATORY_CARE_PROVIDER_SITE_OTHER)

## 2024-12-01 ENCOUNTER — Ambulatory Visit (INDEPENDENT_AMBULATORY_CARE_PROVIDER_SITE_OTHER): Admitting: Nurse Practitioner

## 2024-12-01 ENCOUNTER — Encounter (INDEPENDENT_AMBULATORY_CARE_PROVIDER_SITE_OTHER)
